# Patient Record
Sex: Male | Born: 1955 | Race: White | Hispanic: No | Marital: Married | Smoking: Current every day smoker
Health system: Southern US, Community
[De-identification: ages and names within clinical notes are randomized; demographics above are authoritative.]

## PROBLEM LIST (undated history)

## (undated) DIAGNOSIS — Z951 Presence of aortocoronary bypass graft: Secondary | ICD-10-CM

## (undated) DIAGNOSIS — Z720 Tobacco use: Secondary | ICD-10-CM

## (undated) DIAGNOSIS — K219 Gastro-esophageal reflux disease without esophagitis: Secondary | ICD-10-CM

## (undated) DIAGNOSIS — R0600 Dyspnea, unspecified: Secondary | ICD-10-CM

## (undated) DIAGNOSIS — E785 Hyperlipidemia, unspecified: Secondary | ICD-10-CM

## (undated) DIAGNOSIS — I2510 Atherosclerotic heart disease of native coronary artery without angina pectoris: Secondary | ICD-10-CM

## (undated) DIAGNOSIS — N183 Chronic kidney disease, stage 3 unspecified: Secondary | ICD-10-CM

## (undated) DIAGNOSIS — I10 Essential (primary) hypertension: Secondary | ICD-10-CM

## (undated) DIAGNOSIS — I509 Heart failure, unspecified: Secondary | ICD-10-CM

## (undated) DIAGNOSIS — I5031 Acute diastolic (congestive) heart failure: Secondary | ICD-10-CM

## (undated) HISTORY — DX: Tobacco use: Z72.0

## (undated) HISTORY — DX: Gastro-esophageal reflux disease without esophagitis: K21.9

## (undated) HISTORY — DX: Hyperlipidemia, unspecified: E78.5

## (undated) HISTORY — DX: Essential (primary) hypertension: I10

## (undated) HISTORY — DX: Acute diastolic (congestive) heart failure: I50.31

## (undated) HISTORY — DX: Presence of aortocoronary bypass graft: Z95.1

---

## 1998-12-08 ENCOUNTER — Encounter: Admission: RE | Admit: 1998-12-08 | Discharge: 1998-12-08 | Payer: Self-pay | Admitting: Family Medicine

## 1999-10-14 ENCOUNTER — Encounter: Admission: RE | Admit: 1999-10-14 | Discharge: 1999-10-14 | Payer: Self-pay | Admitting: Family Medicine

## 1999-11-11 ENCOUNTER — Encounter: Admission: RE | Admit: 1999-11-11 | Discharge: 1999-11-11 | Payer: Self-pay | Admitting: Family Medicine

## 2000-05-25 ENCOUNTER — Encounter: Admission: RE | Admit: 2000-05-25 | Discharge: 2000-05-25 | Payer: Self-pay | Admitting: Family Medicine

## 2000-06-07 ENCOUNTER — Encounter: Payer: Self-pay | Admitting: Emergency Medicine

## 2000-06-07 ENCOUNTER — Emergency Department (HOSPITAL_COMMUNITY): Admission: EM | Admit: 2000-06-07 | Discharge: 2000-06-07 | Payer: Self-pay | Admitting: Emergency Medicine

## 2000-06-12 ENCOUNTER — Emergency Department (HOSPITAL_COMMUNITY): Admission: EM | Admit: 2000-06-12 | Discharge: 2000-06-12 | Payer: Self-pay | Admitting: Emergency Medicine

## 2002-02-15 ENCOUNTER — Emergency Department (HOSPITAL_COMMUNITY): Admission: EM | Admit: 2002-02-15 | Discharge: 2002-02-15 | Payer: Self-pay | Admitting: Emergency Medicine

## 2002-02-17 ENCOUNTER — Encounter: Payer: Self-pay | Admitting: Family Medicine

## 2002-02-17 ENCOUNTER — Inpatient Hospital Stay (HOSPITAL_COMMUNITY): Admission: EM | Admit: 2002-02-17 | Discharge: 2002-02-23 | Payer: Self-pay | Admitting: Emergency Medicine

## 2002-02-20 ENCOUNTER — Encounter: Payer: Self-pay | Admitting: Family Medicine

## 2002-03-15 ENCOUNTER — Encounter: Admission: RE | Admit: 2002-03-15 | Discharge: 2002-03-15 | Payer: Self-pay | Admitting: Family Medicine

## 2002-04-08 ENCOUNTER — Encounter: Admission: RE | Admit: 2002-04-08 | Discharge: 2002-04-08 | Payer: Self-pay | Admitting: Sports Medicine

## 2002-04-25 ENCOUNTER — Encounter: Admission: RE | Admit: 2002-04-25 | Discharge: 2002-04-25 | Payer: Self-pay | Admitting: Family Medicine

## 2002-05-23 ENCOUNTER — Encounter: Admission: RE | Admit: 2002-05-23 | Discharge: 2002-05-23 | Payer: Self-pay | Admitting: Family Medicine

## 2002-05-28 ENCOUNTER — Encounter: Admission: RE | Admit: 2002-05-28 | Discharge: 2002-05-28 | Payer: Self-pay | Admitting: Family Medicine

## 2002-05-30 ENCOUNTER — Encounter: Admission: RE | Admit: 2002-05-30 | Discharge: 2002-05-30 | Payer: Self-pay | Admitting: Family Medicine

## 2002-06-04 ENCOUNTER — Encounter: Admission: RE | Admit: 2002-06-04 | Discharge: 2002-06-04 | Payer: Self-pay | Admitting: Sports Medicine

## 2002-06-05 ENCOUNTER — Encounter: Admission: RE | Admit: 2002-06-05 | Discharge: 2002-06-05 | Payer: Self-pay | Admitting: Sports Medicine

## 2002-06-05 ENCOUNTER — Encounter: Payer: Self-pay | Admitting: Sports Medicine

## 2002-06-11 ENCOUNTER — Encounter: Admission: RE | Admit: 2002-06-11 | Discharge: 2002-06-11 | Payer: Self-pay | Admitting: Family Medicine

## 2002-06-17 ENCOUNTER — Encounter: Admission: RE | Admit: 2002-06-17 | Discharge: 2002-06-17 | Payer: Self-pay | Admitting: Family Medicine

## 2002-06-26 ENCOUNTER — Encounter: Admission: RE | Admit: 2002-06-26 | Discharge: 2002-06-26 | Payer: Self-pay | Admitting: Family Medicine

## 2002-07-11 ENCOUNTER — Encounter: Admission: RE | Admit: 2002-07-11 | Discharge: 2002-07-11 | Payer: Self-pay | Admitting: Family Medicine

## 2002-08-14 ENCOUNTER — Encounter: Admission: RE | Admit: 2002-08-14 | Discharge: 2002-08-14 | Payer: Self-pay | Admitting: Family Medicine

## 2002-09-05 ENCOUNTER — Encounter: Admission: RE | Admit: 2002-09-05 | Discharge: 2002-09-05 | Payer: Self-pay | Admitting: Family Medicine

## 2002-09-20 ENCOUNTER — Encounter: Admission: RE | Admit: 2002-09-20 | Discharge: 2002-09-20 | Payer: Self-pay | Admitting: Family Medicine

## 2002-10-02 ENCOUNTER — Encounter: Admission: RE | Admit: 2002-10-02 | Discharge: 2002-10-02 | Payer: Self-pay | Admitting: Sports Medicine

## 2002-10-08 ENCOUNTER — Encounter: Payer: Self-pay | Admitting: Sports Medicine

## 2002-10-08 ENCOUNTER — Encounter: Admission: RE | Admit: 2002-10-08 | Discharge: 2002-10-08 | Payer: Self-pay | Admitting: Sports Medicine

## 2002-10-10 ENCOUNTER — Encounter: Admission: RE | Admit: 2002-10-10 | Discharge: 2002-10-10 | Payer: Self-pay | Admitting: Family Medicine

## 2002-10-15 ENCOUNTER — Encounter: Admission: RE | Admit: 2002-10-15 | Discharge: 2002-10-15 | Payer: Self-pay | Admitting: Sports Medicine

## 2002-10-15 ENCOUNTER — Encounter: Payer: Self-pay | Admitting: Sports Medicine

## 2002-11-26 ENCOUNTER — Encounter: Admission: RE | Admit: 2002-11-26 | Discharge: 2002-11-26 | Payer: Self-pay | Admitting: Family Medicine

## 2002-12-26 ENCOUNTER — Encounter: Admission: RE | Admit: 2002-12-26 | Discharge: 2002-12-26 | Payer: Self-pay | Admitting: Family Medicine

## 2004-03-03 LAB — LAB REPORT - SCANNED
Creatinine, POC: 50 mg/dL
EGFR: 43

## 2006-01-04 ENCOUNTER — Ambulatory Visit: Payer: Self-pay | Admitting: Family Medicine

## 2007-02-08 DIAGNOSIS — N529 Male erectile dysfunction, unspecified: Secondary | ICD-10-CM | POA: Insufficient documentation

## 2008-09-23 ENCOUNTER — Telehealth (INDEPENDENT_AMBULATORY_CARE_PROVIDER_SITE_OTHER): Payer: Self-pay | Admitting: *Deleted

## 2008-10-16 ENCOUNTER — Encounter: Payer: Self-pay | Admitting: *Deleted

## 2008-10-16 ENCOUNTER — Ambulatory Visit: Payer: Self-pay | Admitting: Family Medicine

## 2008-10-16 DIAGNOSIS — G8929 Other chronic pain: Secondary | ICD-10-CM | POA: Insufficient documentation

## 2008-10-16 DIAGNOSIS — M545 Low back pain: Secondary | ICD-10-CM

## 2008-10-16 HISTORY — DX: Other chronic pain: G89.29

## 2008-10-17 ENCOUNTER — Ambulatory Visit: Payer: Self-pay | Admitting: Family Medicine

## 2008-10-17 ENCOUNTER — Encounter: Payer: Self-pay | Admitting: Family Medicine

## 2008-10-20 LAB — CONVERTED CEMR LAB
Chloride: 100 meq/L (ref 96–112)
Creatinine, Ser: 1.04 mg/dL (ref 0.40–1.50)
Potassium: 5.4 meq/L — ABNORMAL HIGH (ref 3.5–5.3)
Sodium: 135 meq/L (ref 135–145)

## 2008-10-22 ENCOUNTER — Encounter: Payer: Self-pay | Admitting: Family Medicine

## 2008-11-10 ENCOUNTER — Telehealth: Payer: Self-pay | Admitting: *Deleted

## 2008-11-11 ENCOUNTER — Telehealth (INDEPENDENT_AMBULATORY_CARE_PROVIDER_SITE_OTHER): Payer: Self-pay | Admitting: *Deleted

## 2008-12-01 ENCOUNTER — Ambulatory Visit: Payer: Self-pay | Admitting: Family Medicine

## 2008-12-01 ENCOUNTER — Encounter: Payer: Self-pay | Admitting: Family Medicine

## 2008-12-01 LAB — CONVERTED CEMR LAB
AST: 15 units/L (ref 0–37)
Albumin: 4.2 g/dL (ref 3.5–5.2)
Alkaline Phosphatase: 62 units/L (ref 39–117)
Glucose, Bld: 304 mg/dL — ABNORMAL HIGH (ref 70–99)
LDL Cholesterol: 130 mg/dL — ABNORMAL HIGH (ref 0–99)
Potassium: 4.6 meq/L (ref 3.5–5.3)
Sodium: 138 meq/L (ref 135–145)
Total Protein: 6.9 g/dL (ref 6.0–8.3)
Triglycerides: 90 mg/dL (ref <150)

## 2008-12-02 ENCOUNTER — Telehealth: Payer: Self-pay | Admitting: *Deleted

## 2008-12-02 ENCOUNTER — Encounter: Payer: Self-pay | Admitting: Family Medicine

## 2008-12-08 ENCOUNTER — Telehealth: Payer: Self-pay | Admitting: Family Medicine

## 2008-12-17 ENCOUNTER — Telehealth: Payer: Self-pay | Admitting: *Deleted

## 2008-12-21 ENCOUNTER — Encounter: Payer: Self-pay | Admitting: Family Medicine

## 2008-12-21 DIAGNOSIS — E1169 Type 2 diabetes mellitus with other specified complication: Secondary | ICD-10-CM | POA: Insufficient documentation

## 2008-12-21 DIAGNOSIS — E785 Hyperlipidemia, unspecified: Secondary | ICD-10-CM | POA: Insufficient documentation

## 2008-12-22 ENCOUNTER — Ambulatory Visit: Payer: Self-pay | Admitting: Family Medicine

## 2008-12-22 DIAGNOSIS — E1159 Type 2 diabetes mellitus with other circulatory complications: Secondary | ICD-10-CM | POA: Insufficient documentation

## 2008-12-22 DIAGNOSIS — I10 Essential (primary) hypertension: Secondary | ICD-10-CM | POA: Insufficient documentation

## 2008-12-22 HISTORY — DX: Type 2 diabetes mellitus with other circulatory complications: E11.59

## 2008-12-30 ENCOUNTER — Telehealth: Payer: Self-pay | Admitting: Family Medicine

## 2009-01-27 ENCOUNTER — Telehealth: Payer: Self-pay | Admitting: Family Medicine

## 2009-02-23 ENCOUNTER — Telehealth: Payer: Self-pay | Admitting: Family Medicine

## 2009-03-25 ENCOUNTER — Telehealth: Payer: Self-pay | Admitting: Family Medicine

## 2009-04-17 ENCOUNTER — Telehealth: Payer: Self-pay | Admitting: Family Medicine

## 2009-05-18 ENCOUNTER — Telehealth: Payer: Self-pay | Admitting: Family Medicine

## 2009-06-15 ENCOUNTER — Encounter: Payer: Self-pay | Admitting: Family Medicine

## 2009-06-17 ENCOUNTER — Ambulatory Visit: Payer: Self-pay | Admitting: Family Medicine

## 2009-06-17 LAB — CONVERTED CEMR LAB: Hgb A1c MFr Bld: 9.2 %

## 2009-06-22 ENCOUNTER — Telehealth: Payer: Self-pay | Admitting: Family Medicine

## 2009-07-21 ENCOUNTER — Telehealth: Payer: Self-pay | Admitting: Family Medicine

## 2009-07-29 ENCOUNTER — Ambulatory Visit: Payer: Self-pay | Admitting: Family Medicine

## 2009-07-29 ENCOUNTER — Encounter: Payer: Self-pay | Admitting: Family Medicine

## 2009-07-29 DIAGNOSIS — Z720 Tobacco use: Secondary | ICD-10-CM

## 2009-07-30 ENCOUNTER — Telehealth (INDEPENDENT_AMBULATORY_CARE_PROVIDER_SITE_OTHER): Payer: Self-pay | Admitting: *Deleted

## 2009-08-24 ENCOUNTER — Telehealth: Payer: Self-pay | Admitting: Family Medicine

## 2009-09-22 ENCOUNTER — Telehealth: Payer: Self-pay | Admitting: *Deleted

## 2009-10-21 ENCOUNTER — Telehealth: Payer: Self-pay | Admitting: Family Medicine

## 2009-11-16 ENCOUNTER — Telehealth: Payer: Self-pay | Admitting: Family Medicine

## 2009-12-14 ENCOUNTER — Telehealth: Payer: Self-pay | Admitting: Family Medicine

## 2010-01-13 ENCOUNTER — Telehealth: Payer: Self-pay | Admitting: Family Medicine

## 2010-02-15 ENCOUNTER — Telehealth: Payer: Self-pay | Admitting: Family Medicine

## 2010-02-24 ENCOUNTER — Encounter: Payer: Self-pay | Admitting: Family Medicine

## 2010-03-08 ENCOUNTER — Telehealth: Payer: Self-pay | Admitting: *Deleted

## 2010-03-18 ENCOUNTER — Ambulatory Visit: Payer: Self-pay | Admitting: Family Medicine

## 2010-03-18 LAB — CONVERTED CEMR LAB: Hgb A1c MFr Bld: 10 %

## 2010-04-13 ENCOUNTER — Telehealth: Payer: Self-pay | Admitting: Family Medicine

## 2010-05-17 ENCOUNTER — Telehealth: Payer: Self-pay | Admitting: *Deleted

## 2010-06-03 ENCOUNTER — Encounter: Payer: Self-pay | Admitting: Family Medicine

## 2010-06-04 ENCOUNTER — Telehealth: Payer: Self-pay | Admitting: Family Medicine

## 2010-07-02 ENCOUNTER — Encounter: Payer: Self-pay | Admitting: Family Medicine

## 2010-11-29 ENCOUNTER — Encounter: Payer: Self-pay | Admitting: Family Medicine

## 2010-11-29 ENCOUNTER — Ambulatory Visit: Payer: Self-pay | Admitting: Family Medicine

## 2010-11-29 LAB — CONVERTED CEMR LAB
ALT: 14 units/L (ref 0–53)
AST: 14 units/L (ref 0–37)
Albumin: 4.2 g/dL (ref 3.5–5.2)
Alkaline Phosphatase: 48 units/L (ref 39–117)
Hgb A1c MFr Bld: 8.9 %
LDL Cholesterol: 106 mg/dL — ABNORMAL HIGH (ref 0–99)
Potassium: 5.1 meq/L (ref 3.5–5.3)
Sodium: 138 meq/L (ref 135–145)
Total Bilirubin: 0.4 mg/dL (ref 0.3–1.2)
Total Protein: 6.5 g/dL (ref 6.0–8.3)
VLDL: 14 mg/dL (ref 0–40)

## 2010-12-28 ENCOUNTER — Ambulatory Visit: Admission: RE | Admit: 2010-12-28 | Discharge: 2010-12-28 | Payer: Self-pay | Source: Home / Self Care

## 2011-01-05 ENCOUNTER — Telehealth: Payer: Self-pay | Admitting: Family Medicine

## 2011-01-13 ENCOUNTER — Encounter: Payer: Self-pay | Admitting: Family Medicine

## 2011-01-13 ENCOUNTER — Ambulatory Visit: Admit: 2011-01-13 | Payer: Self-pay

## 2011-01-13 ENCOUNTER — Ambulatory Visit: Payer: Self-pay | Admitting: Family Medicine

## 2011-01-13 NOTE — Progress Notes (Signed)
Summary: Rx Req   Phone Note Refill Request Call back at Home Phone [phone] Message from:  Patient  Refills Requested: Medication #1:  OXYCODONE HCL 5 MG CAPS take 1 tablet by mouth q 6 hours as needed for severe pain PLEASE CALL WHEN READY TO PICK UP.  Initial call taken by: Clydell Hakim,  January 13, 2010 11:45 AM Caller: Spouse  Follow-up for Phone Call        will forward to MD. Follow-up by: Theresia Lo RN,  January 13, 2010 3:10 PM  Additional Follow-up for Phone Call Additional follow up Details #1::        script filled and placed in to be called box Additional Follow-up by: Asher Muir MD,  January 14, 2010 1:45 PM    Prescriptions: OXYCODONE HCL 5 MG CAPS (OXYCODONE HCL) take 1 tablet by mouth q 6 hours as needed for severe pain  #120 x 0   Entered and Authorized by:   Asher Muir MD   Signed by:   Asher Muir MD on 01/14/2010   Method used:   Print then Give to Patient   RxID:   [id number]

## 2011-01-13 NOTE — Miscellaneous (Signed)
Summary: patient summary   Clinical Lists Changes  Very nice patient.  Unfortunately, has not managed his diabetes or hypertension well.  often does not follow up as instructed.  also, complicated by fact that he is uninsured because he is self -employed, and apparently, he makes too much money to qualify for [employer] through the hospital.  Problems: Assessed NICOTINE ADDICTION as comment only -    Assessed DIABETES MELLITUS II, UNCOMPLICATED as comment only - poorly controlled.  comes in infrequently.   His updated medication list for this problem includes:    Novolin 70/30 Innolet 70-30 % Susp (Insulin isophane & regular) .Marland KitchenMarland KitchenMarland KitchenMarland Kitchen 30 in the morning and 20 units at night    Lisinopril 20 Mg Tabs (Lisinopril) .Marland Kitchen... 2 tablets by mouth daily for blood pressure    Metformin Hcl 1000 Mg Tabs (Metformin hcl) .Marland Kitchen... 1 tab by mouth two times a day for diabetes  Labs Reviewed: Creat: 0.93 (12/01/2008)    Reviewed HgBA1c results: 10.0 (03/18/2010)  9.2 (06/17/2009)  Assessed HYPERTENSION, BENIGN as comment only - also poorly controlled. His updated medication list for this problem includes:    Lisinopril 20 Mg Tabs (Lisinopril) .Marland Kitchen... 2 tablets by mouth daily for blood pressure  Prior BP: 160/74 (03/18/2010)  Labs Reviewed: K+: 4.6 (12/01/2008) Creat: : 0.93 (12/01/2008)   Chol: 205 (12/01/2008)   HDL: 57 (12/01/2008)   LDL: 130 (12/01/2008)   TG: 90 (12/01/2008)  Assessed HYPERLIPIDEMIA as comment only -  His updated medication list for this problem includes:    Simvastatin 40 Mg Tabs (Simvastatin) .Marland Kitchen... Take 1 tab by mouth daily  Assessed BACK PAIN, LUMBAR as comment only -  His updated medication list for this problem includes:    Flexeril 10 Mg Tabs (Cyclobenzaprine hcl) .Marland Kitchen... Take 1 tab three times a day; do not climb ladders or operate heavy equipment while using this medicine    Oxycodone Hcl 5 Mg Caps (Oxycodone hcl) .Marland Kitchen... Take 1 tablet by mouth q 6 hours as needed for  severe pain; do not fill until Apr 16, 2010    Meloxicam 15 Mg Tabs (Meloxicam) .Marland Kitchen... 1/2 to 1 tab by mouth daily as needed back pain  Observations: Added new observation of PMH REVIEWED: reviewed - no changes required (06/03/2010 13:20)      Impression & Recommendations:  Problem # 1:  DIABETES MELLITUS II, UNCOMPLICATED (ICD-250.00) poorly controlled.  comes in infrequently.   His updated medication list for this problem includes:    Novolin 70/30 Innolet 70-30 % Susp (Insulin isophane & regular) .Marland KitchenMarland KitchenMarland KitchenMarland Kitchen 30 in the morning and 20 units at night    Lisinopril 20 Mg Tabs (Lisinopril) .Marland Kitchen... 2 tablets by mouth daily for blood pressure    Metformin Hcl 1000 Mg Tabs (Metformin hcl) .Marland Kitchen... 1 tab by mouth two times a day for diabetes  Labs Reviewed: Creat: 0.93 (12/01/2008)    Reviewed HgBA1c results: 10.0 (03/18/2010)  9.2 (06/17/2009)  Problem # 2:  NICOTINE ADDICTION (ICD-305.1) Assessment: Comment Only  Problem # 3:  HYPERTENSION, BENIGN (ICD-401.1) Assessment: Comment Only also poorly controlled. His updated medication list for this problem includes:    Lisinopril 20 Mg Tabs (Lisinopril) .Marland Kitchen... 2 tablets by mouth daily for blood pressure  Prior BP: 160/74 (03/18/2010)  Labs Reviewed: K+: 4.6 (12/01/2008) Creat: : 0.93 (12/01/2008)   Chol: 205 (12/01/2008)   HDL: 57 (12/01/2008)   LDL: 130 (12/01/2008)   TG: 90 (12/01/2008)  Problem # 4:  HYPERLIPIDEMIA (ICD-272.4) Assessment: Comment Only past due  for flp and cmet His updated medication list for this problem includes:    Simvastatin 40 Mg Tabs (Simvastatin) .Marland Kitchen... Take 1 tab by mouth daily  Labs Reviewed: SGOT: 15 (12/01/2008)   SGPT: 18 (12/01/2008)   HDL:57 (12/01/2008)  LDL:130 (12/01/2008)  Chol:205 (12/01/2008)  Trig:90 (12/01/2008)  Problem # 5:  BACK PAIN, LUMBAR (ICD-724.2) Assessment: Comment Only on chronic oxycodone, which seems to control pain.  other modalities, such as pt, are challenging because of the lack  of insurance His updated medication list for this problem includes:    Flexeril 10 Mg Tabs (Cyclobenzaprine hcl) .Marland Kitchen... Take 1 tab three times a day; do not climb ladders or operate heavy equipment while using this medicine    Oxycodone Hcl 5 Mg Caps (Oxycodone hcl) .Marland Kitchen... Take 1 tablet by mouth q 6 hours as needed for severe pain; do not fill until Apr 16, 2010    Meloxicam 15 Mg Tabs (Meloxicam) .Marland Kitchen... 1/2 to 1 tab by mouth daily as needed back pain  Complete Medication List: 1)  Flexeril 10 Mg Tabs (Cyclobenzaprine hcl) .... Take 1 tab three times a day; do not climb ladders or operate heavy equipment while using this medicine 2)  Oxycodone Hcl 5 Mg Caps (Oxycodone hcl) .... Take 1 tablet by mouth q 6 hours as needed for severe pain; do not fill until Apr 16, 2010 3)  Novolin 70/30 Innolet 70-30 % Susp (Insulin isophane & regular) .... 30 in the morning and 20 units at night 4)  Lisinopril 20 Mg Tabs (Lisinopril) .... 2 tablets by mouth daily for blood pressure 5)  Simvastatin 40 Mg Tabs (Simvastatin) .... Take 1 tab by mouth daily 6)  Metformin Hcl 1000 Mg Tabs (Metformin hcl) .Marland Kitchen.. 1 tab by mouth two times a day for diabetes 7)  Meloxicam 15 Mg Tabs (Meloxicam) .... 1/2 to 1 tab by mouth daily as needed back pain   Past History:  Past Medical History: Reviewed history from 12/22/2008 and no changes required. 1.5 ppd smoker (>30 pack year)  2.  DM 3.  HLD 4.  back pain

## 2011-01-13 NOTE — Assessment & Plan Note (Signed)
Summary: bp f/u eo    Complete Medication List: 1)  Flexeril 10 Mg Tabs (Cyclobenzaprine hcl) .... Take 1 tab three times a day; do not climb ladders or operate heavy equipment while using this medicine 2)  Novolin 70/30 Innolet 70-30 % Susp (Insulin isophane & regular) .... 30 in the morning and 20 units at night 3)  Lisinopril 20 Mg Tabs (Lisinopril) .... 2 tablets by mouth daily for blood pressure 4)  Simvastatin 40 Mg Tabs (Simvastatin) .... Take 1 tab by mouth daily 5)  Metformin Hcl 1000 Mg Tabs (Metformin hcl) .Marland Kitchen.. 1 tab by mouth two times a day for diabetes 6)  Meloxicam 15 Mg Tabs (Meloxicam) .... 1/2 to 1 tab by mouth daily as needed back pain 7)  Norco 10-325 Mg Tabs (Hydrocodone-acetaminophen) .Marland Kitchen.. 1 tab by mouth three times a day as needed severe pain  Other Orders: No Charge Patient Arrived (NCPA0) (NCPA0)   Orders Added: 1)  No Charge Patient Arrived (NCPA0) [NCPA0]  Appended Document: bp f/u eo Recieved call form Erin up front around 4:30, pt wanting to know why he hasn't been seen yet. Thekla called pt 3 times, checked bathroom and parking lot pt was not here. appt was at 3:00.

## 2011-01-13 NOTE — Letter (Signed)
Summary: Generic Letter  [facility]  [address]   [place], [place] [address]   Phone: [phone]  Fax: [phone]    02/24/2010  [name] [address] [place], [place]  [address]  Dear Colin Keller,    You have not been seen in our office in some time.  Please call [phone] and schedule an office visit.  I look forward to seeing you.        Sincerely,   Asher Muir MD  Appended Document: Generic Letter mailed.  Appended Document: Generic Letter Letter returned unable to forward.

## 2011-01-13 NOTE — Progress Notes (Signed)
Summary: refill   Phone Note Refill Request Call back at Home Phone [phone] Call back at Work Phone [phone] Message from:  Patient  Refills Requested: Medication #1:  OXYCODONE HCL 5 MG CAPS take 1 tablet by mouth q 6 hours as needed for severe pain Please call when ready  Initial call taken by: De Nurse,  February 15, 2010 2:13 PM  Follow-up for Phone Call        will leave up front;   when you call him, would you ask him to schedule an appointment for his diabetes?  thanks Follow-up by: Asher Muir MD,  February 16, 2010 12:42 PM  Additional Follow-up for Phone Call Additional follow up Details #1::        pt's wife will pick up    Prescriptions: OXYCODONE HCL 5 MG CAPS (OXYCODONE HCL) take 1 tablet by mouth q 6 hours as needed for severe pain  #120 x 0   Entered and Authorized by:   Asher Muir MD   Signed by:   Asher Muir MD on 02/16/2010   Method used:   Print then Give to Patient   RxID:   [id number]

## 2011-01-13 NOTE — Progress Notes (Signed)
Summary: refill   Phone Note Refill Request Call back at Home Phone [phone] Call back at Work Phone [phone] Message from:  Patient  Refills Requested: Medication #1:  OXYCODONE HCL 5 MG CAPS take 1 tablet by mouth q 6 hours as needed for severe pain Please call when ready  Initial call taken by: De Nurse,  December 14, 2009 3:11 PM  Follow-up for Phone Call        to pcp Follow-up by: Golden Circle RN,  December 14, 2009 3:21 PM  Additional Follow-up for Phone Call Additional follow up Details #1::        will leave script up front.  can fill on 1/7.   Additional Follow-up by: Asher Muir MD,  December 15, 2009 8:58 AM    Prescriptions: OXYCODONE HCL 5 MG CAPS (OXYCODONE HCL) take 1 tablet by mouth q 6 hours as needed for severe pain  #120 x 0   Entered and Authorized by:   Asher Muir MD   Signed by:   Asher Muir MD on 12/15/2009   Method used:   Print then Give to Patient   RxID:   [id number]   Appended Document: refill lvmom for pt to pick up

## 2011-01-13 NOTE — Progress Notes (Signed)
Summary: Rx Req  Medications Added OXYCODONE HCL 5 MG CAPS (OXYCODONE HCL) take 1 tablet by mouth q 6 hours as needed for severe pain; do not fill until Apr 16, 2010       Phone Note Refill Request Call back at [facility] Phone [phone] Message from:  Patient  Refills Requested: Medication #1:  OXYCODONE HCL 5 MG CAPS take 1 tablet by mouth q 6 hours as needed for severe pain Initial call taken by: Clydell Hakim,  Apr 13, 2010 11:48 AM    New/Updated Medications: OXYCODONE HCL 5 MG CAPS (OXYCODONE HCL) take 1 tablet by mouth q 6 hours as needed for severe pain; do not fill until Apr 16, 2010 Prescriptions: OXYCODONE HCL 5 MG CAPS (OXYCODONE HCL) take 1 tablet by mouth q 6 hours as needed for severe pain; do not fill until Apr 16, 2010  #120 x 0   Entered and Authorized by:   Asher Muir MD   Signed by:   Asher Muir MD on 04/13/2010   Method used:   Print then Give to Patient   RxID:   [id number]   Appended Document: Rx Req informed pt

## 2011-01-13 NOTE — Progress Notes (Signed)
Summary: refill   Phone Note Refill Request Call back at Home Phone [phone] Message from:  Patient  Refills Requested: Medication #1:  NORCO 10-325 MG TABS 1 tab by mouth three times a day as needed severe pain. Next Appointment Scheduled: 01/13/11 Initial call taken by: De Nurse,  January 05, 2011 1:43 PM  Follow-up for Phone Call        will forward to preceptor . Follow-up by: Theresia Lo RN,  January 05, 2011 2:42 PM  Additional Follow-up for Phone Call Additional follow up Details #1::        I will refill prescription for 2 weeks until patient can make his appointment. Will print for patient to pick up.  Additional Follow-up by: Lloyd Huger MD,  January 06, 2011 3:45 PM    Prescriptions: NORCO 10-325 MG TABS (HYDROCODONE-ACETAMINOPHEN) 1 tab by mouth three times a day as needed severe pain  #30 x 0    Entered and Authorized by:   Lloyd Huger MD   Signed by:   Theresia Lo RN on 01/06/2011   Method used:   Print then Give to Patient   RxID:   [id number]  Will not refill at this time.  Needs appt to discuss. Paula Compton MD  January 05, 2011 3:24 PM    called and spoke with wife, she states she called about refilling his RX. explained that patient will need appointment before refill. wife states he had appointment on 12/28/2010 and sat in waiting room for 2 hours and no one ever called him back , finally he left.  it is noted in IDX that he no showed but wife states he was here. has an appointment on 01/13/2011. will send message to Dr. Cristal Ford to ask if she will refill [Dictation 78-1 goes here.] before he comes on 01/13/2011. Theresia Lo RN  January 05, 2011 3:44 PM   RX called to pharmacy. Theresia Lo RN  January 06, 2011 5:36 PM

## 2011-01-13 NOTE — Assessment & Plan Note (Signed)
Summary: f/up,tcb   Vital Signs:  Patient profile:   55 year old male Weight:      174.4 pounds Temp:     98.3 degrees F oral Pulse rate:   84 / minute Pulse rhythm:   regular BP sitting:   160 / 74  (left arm) Cuff size:   large  Vitals Entered By: Loralee Pacas CMA (March 18, 2010 1:44 PM)  Primary Care Provider:  Asher Muir MD  CC:  dm, htn, hld, and back pain.  History of Present Illness: 1.  diabetes--A1C 10.  on 70/30.  taking 25 in am and 15 in pm.  also on metformin.  lowest cbg 65 late afternoon.  if he has lows, they are almost always in the late afternoon.  tried to increase am dose, but felt lightheaded/nauseated.  was up to 30 units in the morning for about a month.  took himself back down to 25.  may feel a little better with this.  not checking sugars every day.  2.  hypertension--still high on lisinopril 20.  takes every day, not always at same time.  makes him a little lightheaded. high today at  160/74.  no chest pain, sob.    3.  hyperlipidemia--at goal on simva  4.  back pain--lower back with pain radiating down left leg.  no weaknes, no b/b incontinence.  no groin anesthesia   Current Medications (verified): 1)  Flexeril 10 Mg Tabs (Cyclobenzaprine Hcl) .... Take 1 Tab Three Times A Day; Do Not Climb Ladders or Operate Heavy Equipment While Using This Medicine 2)  Oxycodone Hcl 5 Mg Caps (Oxycodone Hcl) .... Take 1 Tablet By Mouth Q 6 Hours As Needed For Severe Pain 3)  Novolin 70/30 Innolet 70-30 % Susp (Insulin Isophane & Regular) .... 30 in The Morning and 15 Units At Night 4)  Lisinopril 20 Mg Tabs (Lisinopril) .Marland Kitchen.. 1 Tab By Mouth Daily For Blood Pressure; Pt Needs To Schedule Follow Up Appointment 5)  Simvastatin 40 Mg Tabs (Simvastatin) .... Take 1 Tab By Mouth Daily 6)  Metformin Hcl 1000 Mg Tabs (Metformin Hcl) .... 1/2 Tab By Mouth Daily For 1 Week; Then 1/2 Tab in Am and 1/2 Tab in Pm 1 Week ; Then 1 Tab in Am and 1/2 Tab in Pm 1 Wk. Then 1 Tab  Two Times A Day 7)  Meloxicam 15 Mg Tabs (Meloxicam) .... 1/2 To 1 Tab By Mouth Daily As Needed Back Pain  Physical Exam  General:  Well-developed,well-nourished,in no acute distress; alert,appropriate and cooperative throughout examination Lungs:  normal respiratory effort and normal breath sounds.  prolonged expiration   Heart:  Normal rate and regular rhythm. S1 and S2 normal without gallop, murmur, click, rub or other extra sounds. Additional Exam:  vital signs reviewed   Diabetes Management Exam:    Foot Exam (with socks and/or shoes not present):       Sensory-Pinprick/Light touch:          Left medial foot (L-4): normal          Left dorsal foot (L-5): normal          Left lateral foot (S-1): normal          Right medial foot (L-4): normal          Right dorsal foot (L-5): normal          Right lateral foot (S-1): normal       Sensory-Monofilament:  Left foot: normal          Right foot: normal       Inspection:          Left foot: normal          Right foot: normal       Nails:          Left foot: normal          Right foot: normal   Impression & Recommendations:  Problem # 1:  DIABETES MELLITUS II, UNCOMPLICATED (ICD-250.00) A1C is worse at 10.  discussed the potential consequences of uncontrolled diabetes.  he is worried about it.  feeling poorly on higher dose of insulin is part of the problem.  not being able to afford test strips is also a problem.  he agrees he needs to get control of his diabetes.  Plan:  increase 70/30 to 30 in the morning and and 20 units at night.  he will call me if still high or if he goes low.  eat an afternoon snack to try to avoid lows.    His updated medication list for this problem includes:    Novolin 70/30 Innolet 70-30 % Susp (Insulin isophane & regular) .Marland KitchenMarland KitchenMarland KitchenMarland Kitchen 30 in the morning and 20 units at night    Lisinopril 20 Mg Tabs (Lisinopril) .Marland Kitchen... 2 tablets by mouth daily for blood pressure    Metformin Hcl 1000 Mg Tabs (Metformin  hcl) .Marland Kitchen... 1 tab by mouth two times a day for diabetes  Orders: A1C-[facility] (81191) [facility]- Est  Level 4 (47829)  Problem # 2:  HYPERTENSION, BENIGN (ICD-401.1) Assessment: Unchanged  still not at goal.  increase lisinopril.  due for bmet, but pt has to pay out of pocket for all labs; so wil check bmet next visit after he has gone up on his lisinopril His updated medication list for this problem includes:    Lisinopril 20 Mg Tabs (Lisinopril) .Marland Kitchen... 2 tablets by mouth daily for blood pressure  Orders: [facility]- Est  Level 4 (99214)  Problem # 3:  HYPERLIPIDEMIA (ICD-272.4) Assessment: Unchanged  at goal His updated medication list for this problem includes:    Simvastatin 40 Mg Tabs (Simvastatin) .Marland Kitchen... Take 1 tab by mouth daily  Orders: [facility]- Est  Level 4 (56213)  Problem # 4:  BACK PAIN, LUMBAR (ICD-724.2) Assessment: Unchanged  symptoms consistent with radiculopathy.  would like to try physical therapy, but cannot afford.  no red flags for immediate imaging. for now continue oxycodone.  need controlled substances contract at next visit.  His updated medication list for this problem includes:    Flexeril 10 Mg Tabs (Cyclobenzaprine hcl) .Marland Kitchen... Take 1 tab three times a day; do not climb ladders or operate heavy equipment while using this medicine    Oxycodone Hcl 5 Mg Caps (Oxycodone hcl) .Marland Kitchen... Take 1 tablet by mouth q 6 hours as needed for severe pain    Meloxicam 15 Mg Tabs (Meloxicam) .Marland Kitchen... 1/2 to 1 tab by mouth daily as needed back pain  Orders: [facility]- Est  Level 4 (99214)  Complete Medication List: 1)  Flexeril 10 Mg Tabs (Cyclobenzaprine hcl) .... Take 1 tab three times a day; do not climb ladders or operate heavy equipment while using this medicine 2)  Oxycodone Hcl 5 Mg Caps (Oxycodone hcl) .... Take 1 tablet by mouth q 6 hours as needed for severe pain 3)  Novolin 70/30 Innolet 70-30 % Susp (Insulin isophane & regular) .... 30 in the morning and  20 units at night 4)  Lisinopril 20  Mg Tabs (Lisinopril) .... 2 tablets by mouth daily for blood pressure 5)  Simvastatin 40 Mg Tabs (Simvastatin) .... Take 1 tab by mouth daily 6)  Metformin Hcl 1000 Mg Tabs (Metformin hcl) .Marland Kitchen.. 1 tab by mouth two times a day for diabetes 7)  Meloxicam 15 Mg Tabs (Meloxicam) .... 1/2 to 1 tab by mouth daily as needed back pain  Patient Instructions: 1)  It was nice to see you today. 2)  Gradually increase your insulin to 30 units in the morning and 20 at night.   3)  Call me if you go low (<60) 4)  If you get to the 30 and 20 and are still high (>140) 5)  Check your sugars two times a day 6)  Try to eat a snack  in the afternoon. 7)  For your blood pressure, start taking 2 tablets of lisinopril.  It is OK to take it at night. 8)  Please schedule a follow-up appointment in 2-4 weeks.  We need to check labs at your next visit.  Prescriptions: LISINOPRIL 20 MG TABS (LISINOPRIL) 2 tablets by mouth daily for blood pressure  #60 x 0   Entered and Authorized by:   Asher Muir MD   Signed by:   Asher Muir MD on 03/18/2010   Method used:   Print then Give to Patient   RxID:   [id number]   Laboratory Results   Blood Tests   Date/Time Received: March 18, 2010 2:02 PM  Date/Time Reported: March 18, 2010 2:02 PM   HGBA1C: 10.0%   (Normal Range: Non-Diabetic - 3-6%   Control Diabetic - 6-8%)  Comments: ...............test performed by......Marland KitchenBonnie A. [place], MLS (ASCP)cm     Prevention & Chronic Care Immunizations   Influenza vaccine: given  (10/16/2008)   Influenza vaccine due: 10/16/2009    Tetanus booster: Not documented    Pneumococcal vaccine: Not documented  Colorectal Screening   Hemoccult: Not documented    Colonoscopy: Not documented  Other Screening   PSA: Not documented   Smoking status: current  (07/29/2009)   Smoking cessation counseling: yes  (12/22/2008)  Diabetes Mellitus   HgbA1C: 10.0  (03/18/2010)   Hemoglobin A1C due: 03/01/2009     Eye exam: Not documented    Foot exam: yes  (03/18/2010)   High risk foot: Not documented   Foot care education: Not documented   Foot exam due: 12/22/2009    Urine microalbumin/creatinine ratio: Not documented    Diabetes flowsheet reviewed?: Yes   Progress toward A1C goal: Deteriorated    Stage of readiness to change (diabetes management): Action  Lipids   Total Cholesterol: 205  (12/01/2008)   LDL: 130  (12/01/2008)   LDL Direct: 98  (07/29/2009)   HDL: 57  (12/01/2008)   Triglycerides: 90  (12/01/2008)    SGOT (AST): 15  (12/01/2008)   SGPT (ALT): 18  (12/01/2008)   Alkaline phosphatase: 62  (12/01/2008)   Total bilirubin: 0.4  (12/01/2008)    Lipid flowsheet reviewed?: Yes   Progress toward LDL goal: At goal  Hypertension   Last Blood Pressure: 160 / 74  (03/18/2010)   Serum creatinine: 0.93  (12/01/2008)   Serum potassium 4.6  (12/01/2008)    Hypertension flowsheet reviewed?: Yes   Progress toward BP goal: Unchanged    Stage of readiness to change (hypertension management): Action  Self-Management Support :   Personal Goals (by the next clinic visit) :  Personal A1C goal: 8  (07/29/2009)     Personal blood pressure goal: 130/80  (07/29/2009)     Personal LDL goal: 100  (07/29/2009)    Diabetes self-management support: Copy of home glucose meter record, CBG self-monitoring log, Written self-care plan  (03/18/2010)   Diabetes care plan printed    Hypertension self-management support: BP self-monitoring log, Written self-care plan, Education handout  (03/18/2010)   Hypertension self-care plan printed.   Hypertension education handout printed    Lipid self-management support: Written self-care plan  (03/18/2010)   Lipid self-care plan printed.

## 2011-01-13 NOTE — Miscellaneous (Signed)
   Clinical Lists Changes  Medications: Rx of METFORMIN HCL 1000 MG TABS (METFORMIN HCL) 1 tab by mouth two times a day for diabetes;  #60 x 12;  Signed;  Entered by: Denny Levy MD;  Authorized by: Denny Levy MD;  Method used: Electronically to [facility]*, [address] [place], [place]  [address], Ph: [phone] or [phone], Fax: [phone]    Prescriptions: METFORMIN HCL 1000 MG TABS (METFORMIN HCL) 1 tab by mouth two times a day for diabetes  #60 x 12   Entered and Authorized by:   Denny Levy MD   Signed by:   Denny Levy MD on 07/02/2010   Method used:   Electronically to        [facility]* (retail)       [address] [place], [place]  [address]       Ph: [phone] or [phone]       Fax: [phone]   RxID:   [id number]

## 2011-01-13 NOTE — Progress Notes (Signed)
Summary: Rx Req   Phone Note Refill Request Call back at Home Phone [phone] Call back at [phone] Message from:  Patient  Refills Requested: Medication #1:  OXYCODONE HCL 5 MG CAPS take 1 tablet by mouth q 6 hours as needed for severe pain; do not fill until May 6 Initial call taken by: Clydell Hakim,  May 17, 2010 11:41 AM  Follow-up for Phone Call        script left up front Follow-up by: Asher Muir MD,  May 17, 2010 5:11 PM  Additional Follow-up for Phone Call Additional follow up Details #1::        lvm for pt to inform him that his rx is up front to be picked up Additional Follow-up by: Loralee Pacas CMA,  May 18, 2010 8:56 AM    Prescriptions: OXYCODONE HCL 5 MG CAPS (OXYCODONE HCL) take 1 tablet by mouth q 6 hours as needed for severe pain; do not fill until Apr 16, 2010  #120 x 0   Entered and Authorized by:   Asher Muir MD   Signed by:   Asher Muir MD on 05/17/2010   Method used:   Print then Give to Patient   RxID:   [id number]

## 2011-01-13 NOTE — Progress Notes (Signed)
Summary: refill   Phone Note Refill Request Call back at Home Phone [phone] Message from:  Patient  Refills Requested: Medication #1:  OXYCODONE HCL 5 MG CAPS take 1 tablet by mouth q 6 hours as needed for severe pain Please call when ready  Initial call taken by: De Nurse,  March 08, 2010 4:12 PM Caller: Patient  Follow-up for Phone Call        I refilled these on 3/8; so I cannot give a refill until 4/7.  Also, he needs to come in for an appointment.  I haven't seen him since August.   Follow-up by: Asher Muir MD,  March 09, 2010 12:45 PM    called and left vm for pt.Loralee Pacas CMA  March 09, 2010 2:21 PM

## 2011-01-13 NOTE — Progress Notes (Signed)
Summary: phn msg  Medications Added NORCO 10-325 MG TABS (HYDROCODONE-ACETAMINOPHEN) 1 tab by mouth three times a day as needed severe pain       Phone Note Call from Patient Call back at Home Phone [phone]   Caller: Spouse-Laurie Summary of Call: Would like to talk to Dr. Lafonda Mosses concerning her husband. Initial call taken by: Clydell Hakim,  June 04, 2010 12:17 PM  Follow-up for Phone Call        He would like to switch pain meds to something that does not have to be picked up every month.  only taking about 2 oxycodone per day.  will switch to norco and give 5 refills.   Follow-up by: Asher Muir MD,  June 07, 2010 11:43 AM    New/Updated Medications: NORCO 10-325 MG TABS (HYDROCODONE-ACETAMINOPHEN) 1 tab by mouth three times a day as needed severe pain Prescriptions: NORCO 10-325 MG TABS (HYDROCODONE-ACETAMINOPHEN) 1 tab by mouth three times a day as needed severe pain  #90 x 5   Entered and Authorized by:   Asher Muir MD   Signed by:   Asher Muir MD on 06/07/2010   Method used:   Printed then faxed to ...       [facility]* (retail)       [address] [place], [place]  [address]       Ph: [phone] or [phone]       Fax: [phone]   RxID:   [id number]    Impression & Recommendations:  Problem # 1:  BACK PAIN, LUMBAR (ICD-724.2) Assessment Comment Only currently on oxycodone 5mg .  He would like to switch pain meds to something that does not have to be picked up every month.  only taking about 2 oxycodone per day.  will switch to norco and give 5 refills.    The following medications were removed from the medication list:    Oxycodone Hcl 5 Mg Caps (Oxycodone hcl) .Marland Kitchen... Take 1 tablet by mouth q 6 hours as needed for severe pain; do not fill until Apr 16, 2010 His updated medication list for this problem includes:    Flexeril 10 Mg Tabs (Cyclobenzaprine hcl) .Marland Kitchen... Take 1  tab three times a day; do not climb ladders or operate heavy equipment while using this medicine    Meloxicam 15 Mg Tabs (Meloxicam) .Marland Kitchen... 1/2 to 1 tab by mouth daily as needed back pain    Norco 10-325 Mg Tabs (Hydrocodone-acetaminophen) .Marland Kitchen... 1 tab by mouth three times a day as needed severe pain  Complete Medication List: 1)  Flexeril 10 Mg Tabs (Cyclobenzaprine hcl) .... Take 1 tab three times a day; do not climb ladders or operate heavy equipment while using this medicine 2)  Novolin 70/30 Innolet 70-30 % Susp (Insulin isophane & regular) .... 30 in the morning and 20 units at night 3)  Lisinopril 20 Mg Tabs (Lisinopril) .... 2 tablets by mouth daily for blood pressure 4)  Simvastatin 40 Mg Tabs (Simvastatin) .... Take 1 tab by mouth daily 5)  Metformin Hcl 1000 Mg Tabs (Metformin hcl) .Marland Kitchen.. 1 tab by mouth two times a day for diabetes 6)  Meloxicam 15 Mg Tabs (Meloxicam) .... 1/2 to 1 tab by mouth daily as needed back pain 7)  Norco 10-325 Mg Tabs (Hydrocodone-acetaminophen) .Marland Kitchen.. 1 tab by mouth three times a day as needed severe pain

## 2011-01-19 NOTE — Assessment & Plan Note (Signed)
Summary: meet new md/eo   Vital Signs:  Patient profile:   55 year old male Weight:      173.2 pounds Temp:     98.4 degrees F oral Pulse rate:   89 / minute BP sitting:   160 / 69  (left arm) Cuff size:   regular  Vitals Entered By: Jimmy Footman, CMA (January 13, 2011 3:33 PM) CC: meet new doctor   Primary Provider:  Lloyd Huger MD  CC:  meet new doctor.  History of Present Illness: 1. HTN. Takes lisinopril inconsistently. PRescribed 40mg  daily, sometimes only takes one pill. Thinks it causes him to feel lightheaded on some days. Does not take BP at home for comparison. Value today is similar to that of previous visits over past 2 years (160s/70s). Patient was unsure of what normal value is.  2. Back pain. 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 Mainly lower back. Relies on pain medications most days, taking norco three times a day. Weaned off of oxycodone this year. On occasion  takes an advil. When he knows he doesn't have to work, tries to go without pain meds until the end of the day.   3. DM. Recent A1c was 8.9, this is improved from 10. Has been taking insulin 25 units in am and 15 at bedtime, gets this OTC at [facility]. Admits to noncompliance. Never checks CBGs due to having no money for testing strips. Doses his insulin on basis of the size of his meals and the way he feels each day. No ophthalmologist f/u in years. No foot ulcers.   4. HLD. Not taking statin due to cost currently. Last LDL was 106, he is convinced he doesn't really need this medicine. Minimizes his red meat intake.  5. Tobacco abuse. Still smoking 1ppd. Has tried to quit several times. Made it several months once, before family stress with his son triggered relapse. THinks cold-turkey would be the best method.   Past History:  Risk Factors: Smoking Status: current (07/29/2009) Packs/Day: 1.0 (07/29/2009)  Past Medical History: 1 ppd smoker (>30 pack year)  DM HLD  back  pain  Family History: Reviewed history and no changes required. DM, CAD in parents.   Social History: Reviewed history from 02/08/2007 and no changes required. 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 He and his wife were both laid off during recession. Wife Lawson Fiscal is also [facility] patient. Grown children. 1ppd smoker. Previously used marijuana -quit 10/03  Review of Systems  The patient denies anorexia, fever, weight loss, syncope, dyspnea on exertion, peripheral edema, prolonged cough, and abdominal pain.    Physical Exam  General:  Well-developed,well-nourished,in no acute distress; alert,appropriate and cooperative throughout examination Head:  normocephalic and atraumatic.   Eyes:  PERRLA, EOMI Mouth:  Oral mucosa and oropharynx without lesions or exudates.  Teeth in good repair. Neck:  R carotid bruit and L carotid bruit.   Lungs:  Normal respiratory effort, chest expands symmetrically. Lungs are clear to auscultation, no crackles or wheezes. Heart:  Normal rate and regular rhythm. S1 and S2 normal. II/VI systolic murmur heard at LLSB.  Extremities:  No clubbing, cyanosis, edema, or deformity noted with normal full range of motion of all joints.   Neurologic:  No cranial nerve deficits noted. Station and gait are normal.Sensory, motor and coordinative functions appear intact.  Diabetes Management Exam:    Foot Exam (with socks and/or shoes not present):       Sensory-Pinprick/Light touch:  Left medial foot (L-4): normal          Left dorsal foot (L-5): normal          Left lateral foot (S-1): normal          Right medial foot (L-4): normal          Right dorsal foot (L-5): normal          Right lateral foot (S-1): normal       Sensory-Monofilament:          Left foot: normal          Right foot: normal       Inspection:          Left foot: normal          Right foot: normal       Nails:          Left foot: normal          Right foot: normal   Impression &  Recommendations:  Problem # 1:  HYPERTENSION, BENIGN (ICD-401.1)  Uncontrolled. Compliance seems to be an issue. Will switch lisinopril 40 to combination with HCTZ since patient feels this higher dose of ACEi causes dizzyness. Encouraged to take BP at home when he is able (friend has BP cuff). Likely will need to titrate this medication.   The following medications were removed from the medication list:    Lisinopril 20 Mg Tabs (Lisinopril) .Marland Kitchen... 2 tablets by mouth daily for blood pressure His updated medication list for this problem includes:    Lisinopril-hydrochlorothiazide 20-12.5 Mg Tabs (Lisinopril-hydrochlorothiazide) .Marland Kitchen... Take one tab by mouth daily.  Orders: [facility]- Est  Level 4 (04540)  Problem # 2:  BACK PAIN, LUMBAR (ICD-724.2)  Refilled Norco for chronic back pain. Attempts at PT have been unsuccessful due to lack of insurance. Would like to address reducing dependence on narcotics at next visit; however, this regimen may be an improvement from previous. EIther decrease strength of tablets or change to tramadol gradually.  The following medications were removed from the medication list:    Flexeril 10 Mg Tabs (Cyclobenzaprine hcl) .Marland Kitchen... Take 1 tab three times a day; do not climb ladders or operate heavy equipment while using this medicine    Meloxicam 15 Mg Tabs (Meloxicam) .Marland Kitchen... 1/2 to 1 tab by mouth daily as needed back pain His updated medication list for this problem includes:    Norco 10-325 Mg Tabs (Hydrocodone-acetaminophen) .Marland Kitchen... 1 tab by mouth three times a day as needed severe pain    Aspir-low 81 Mg Tbec (Aspirin)  Orders: [facility]- Est  Level 4 (98119)  Problem # 3:  DIABETES MELLITUS II, UNCOMPLICATED (ICD-250.00)  Improved control. Compliance is again an issue. Doses his insulin based on meal size and how he feels on a daily basis. Encouraged patient to find a cheap OTC meter/strips at a [employer] ([facility]). Refilled metformin and insulin is obtained OTC.  Will f/u A1c at next visit The following medications were removed from the medication list:    Lisinopril 20 Mg Tabs (Lisinopril) .Marland Kitchen... 2 tablets by mouth daily for blood pressure His updated medication list for this problem includes:    Novolin 70/30 Innolet 70-30 % Susp (Insulin isophane & regular) .Marland KitchenMarland KitchenMarland KitchenMarland Kitchen 30 in the morning and 20 units at night    Metformin Hcl 1000 Mg Tabs (Metformin hcl) .Marland Kitchen... 1 tab by mouth two times a day for diabetes    Aspir-low 81 Mg Tbec (Aspirin)    Lisinopril-hydrochlorothiazide 20-12.5  Mg Tabs (Lisinopril-hydrochlorothiazide) .Marland Kitchen... Take one tab by mouth daily.  Orders: [facility]- Est  Level 4 (44010)  Problem # 4:  NICOTINE ADDICTION (ICD-305.1) Encouraged him to consider cessation. He is in the contemplation stage. We discussed implications for health and the methods he felt would work best (wants to try cold [place]). Reviewed support line (1800quitnow).   Problem # 5:  HYPERLIPIDEMIA (ICD-272.4)  LDL controlled at last check off of statin. Patient not currently taking medication and he feels is unnecessary. Bruits auscultated on exam, asymptomatic. Will recheck fasting lipid panel or d-LDL at next visit. Again encouraged smoking cessation. This patient will likely benefit from statin therapy when he is willing.  I would like to obtain carotid dopplers, however he is unable to get this currently due to insurance. He and his wife are getting paperwork for possible orange care or medicare coverage. Will obtain when patient is agreeable.  The following medications were removed from the medication list:    Simvastatin 40 Mg Tabs (Simvastatin) .Marland Kitchen... Take 1 tab by mouth daily  Future Orders: Carotid Doppler (Carotid doppler) ... 02/09/2011  Complete Medication List: 1)  Novolin 70/30 Innolet 70-30 % Susp (Insulin isophane & regular) .... 30 in the morning and 20 units at night 2)  Metformin Hcl 1000 Mg Tabs (Metformin hcl) .Marland Kitchen.. 1 tab by mouth two times a day for  diabetes 3)  Norco 10-325 Mg Tabs (Hydrocodone-acetaminophen) .Marland Kitchen.. 1 tab by mouth three times a day as needed severe pain 4)  Aspir-low 81 Mg Tbec (Aspirin) 5)  Lisinopril-hydrochlorothiazide 20-12.5 Mg Tabs (Lisinopril-hydrochlorothiazide) .... Take one tab by mouth daily.  Patient Instructions: 1)  Nice to meet you. 2)  Your prescriptions will be at [facility]. 3)  You can get a cheaper glucometer and strips at [facility] or [facility]. 4)  Please think about quitting smoking. You can call 1-800-quitnow for help, or come in for an appointment to discuss options. 5)  Please get your neck ultrasound test to evaluate your neck arteries. 6)  Call me if you have any questions! Prescriptions: NORCO 10-325 MG TABS (HYDROCODONE-ACETAMINOPHEN) 1 tab by mouth three times a day as needed severe pain  #90 x 0    Entered and Authorized by:   Lloyd Huger MD   Signed by:   Lloyd Huger MD on 01/13/2011   Method used:   Print then Give to Patient   RxID:   [id number] METFORMIN HCL 1000 MG TABS (METFORMIN HCL) 1 tab by mouth two times a day for diabetes  #60 x 12   Entered and Authorized by:   Lloyd Huger MD   Signed by:   Lloyd Huger MD on 01/13/2011   Method used:   Electronically to        [facility]* (retail)       [address] [place], [place]  [address]       Ph: [phone] or [phone]       Fax: [phone]   RxID:   [id number] LISINOPRIL-HYDROCHLOROTHIAZIDE 20-12.5 MG TABS (LISINOPRIL-HYDROCHLOROTHIAZIDE) Take one tab by mouth daily.  #90 x 2   Entered and Authorized by:   Lloyd Huger MD   Signed by:   Lloyd Huger MD on 01/13/2011   Method used:   Electronically to        [facility]* (retail)  [address] [place], [place]  [address]       Ph: [phone] or [phone]       Fax: [phone]   RxID:   [id number]    Orders  Added: 1)  Carotid Doppler [Carotid doppler] 2)  [facility]- Est  Level 4 [[address]]

## 2011-02-07 ENCOUNTER — Telehealth: Payer: Self-pay | Admitting: Family Medicine

## 2011-02-07 DIAGNOSIS — M545 Low back pain: Secondary | ICD-10-CM

## 2011-02-07 DIAGNOSIS — E785 Hyperlipidemia, unspecified: Secondary | ICD-10-CM

## 2011-02-07 MED ORDER — SIMVASTATIN 80 MG PO TABS: 40.0000 mg | ORAL_TABLET | Freq: Every evening | ORAL | Status: DC

## 2011-02-07 MED ORDER — HYDROCODONE-ACETAMINOPHEN 10-325 MG PO TABS: 1.0000 | ORAL_TABLET | Freq: Three times a day (TID) | ORAL | Status: DC | PRN

## 2011-02-07 NOTE — Telephone Encounter (Signed)
Mrs. Flegel want to speak with you regarding refills to patients meds.  Only received rx for one.  Use to have multiple refills for meds.

## 2011-03-19 LAB — GLUCOSE, CAPILLARY: Glucose-Capillary: 143 mg/dL — ABNORMAL HIGH (ref 70–99)

## 2011-04-29 NOTE — H&P (Signed)
[place]  Patient:    Colin Keller, Colin Keller Visit Number: [id number] MRN: [id number]          Service Type: MED Location: [facility] 2901 [id number] Attending Physician:  McDiarmid, Leighton Roach. Dictated by:   Mont Dutton, M.D. Admit Date:  02/17/2002                           History and Physical  SERVICE:  Conservation officer, historic buildings.  CHIEF COMPLAINT:  Vomiting.  HISTORY OF PRESENT ILLNESS:  This is a 55 year old white male with a history of diabetes mellitus, type 2, two-day history of prostatitis versus UTI and was diagnosed with this on February 15, 2002 in [facility] Room.  He was noted to have a large prostate on the exam at that time; also with a rotator cuff injury diagnosed at that time, was given a referral to local orthopedist and a referral to a urologist and started on tetracycline on February 15, 2002, took one dose of this, started with emesis.  The following morning at 2 oclock, which is February 16, 2002, he has had continued emesis since that time.  Last insulin treatment was on February 15, 2002 in the morning, none since. Patient complains of feeling terrible since that time, continued with emesis. His last primary M.D. visit was approximately two years ago.  REVIEW OF SYSTEMS:  No headache.  Does admit to fever and chills for a couple of weeks.  CARDIOVASCULAR:  Positive palpitations.  Admits to some vague chest pain anteriorly.  No radiation.  RESPIRATORY:  Slight splinted breathing secondary to his shoulder and muscle pain but denies shortness of breath.  GI: Positive indigestion.  Positive nausea and vomiting.  Denies any hematemesis. No bowel movement in the past two days.  SKIN:  Patient admits to having one tattoo but no rashes.  NEUROLOGIC:  Occasional paresthesias, none currently. MUSCULOSKELETAL:  Admits to myalgias in back.  EYES:  Positive blurry vision today.  States his eyesight has generally been getting weak over the  years. ENT:  Denies any hearing difficulty.  Does admit to having a dry mouth. GENITOURINARY:  Positive dysuria.  Patient states he feels his flow is constricted and he starts and stops his flow.  Denies frequency.  Denies urgency.  PROBLEM LIST: 1. Diabetes mellitus, type 2. 2. Tobacco abuse. 3. Impotence.  MEDICATIONS: 1. Aspirin 81 mg p.o. q.d. when he remembers. 2. Insulin 70/30, 13 to 15 units q.a.m. based on "how he feels." 3. Viagra 50 mg with sexual activity. 4. Advil two to three tabs p.o. q.d.  ALLERGIES:  No known allergies.  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  He has been married for 22 years and has two children, 90 years of age and 47 years of age.  Denies any alcohol use.  He states he has maybe three beers in the summertime, otherwise, no intake of alcohol.  Does admit to 1-1/2-pack-per-day smoking history for greater than 30 years but wants to stop smoking currently.  Does admit to occasional marijuana use at bedtime, otherwise, denies illicit drugs.  FAMILY HISTORY:  Aunt with diabetes mellitus, type 2.  Father passed away at age 76 secondary to heart disease; also had an MI at age 89.  PHYSICAL EXAMINATION:  VITAL SIGNS:  Temperature 97.0, blood pressure 140/104, pulse 135, respiratory rate 22, weight  77 kg.  GENERAL:  Alert, in no apparent distress.  A/O x3.  Insight and judgment intact.  HEENT:  Conjunctivae and nose within normal limits.  No drainage.  PERRL. EOMI.  Dry oral mucosa.  No lesions.  Occasional caries.  No erythema or drainage in posterior oropharynx.  NECK:  Bilateral submandibular nodes palpable.  Thin neck.  Nontender.  No thyromegaly.  RESPIRATORY:  Clear to auscultation bilaterally.  No wheezes, rhonchi or rhonchi.  No respiratory distress or use of accessory musculature.  CARDIOVASCULAR:  Regular rate and rhythm.  Prominent S1 and S2.  No  murmur. No edema on extremities.  MUSCULOSKELETAL:  Strength 5/5 in right upper and right lower extremities, 5/5 strength in left lower extremity, 4/5 strength in left upper extremity.  GI:  No masses.  Soft, nontender, nondistended.  Normoactive bowel sounds.  No hepatosplenomegaly.  SKIN:  Tattoo on the right forearm.  No other rashes noted.  No petechiae.  No purpura.  NEUROLOGIC:  Cranial nerves II-XII intact.  General sensation intact. Reflexes 2+ in bilateral upper and lower extremities.  RECTAL:  Heme-negative stool.  Enlarged prostate was firm and minimally tender to palpation.  LABORATORY AND ACCESSORY DATA:  On ABG, pH 7.28, PCO2 22, PO2 118, bicarb 11, 98% saturation on room air, blood acetone moderate.  BMP:  Sodium 134, potassium 4.5, chloride 92, bicarb 12, BUN 27, creatinine 1.6, glucose of 516, calcium of 10.5, anion gap equals 20, corrected sodium equal 138.  CBC:  White blood cells 32, hemoglobin 16.2, hematocrit 47, platelets 389,000; 92% neutrophils, 6% lymphs 2% monos, ANC was 29.8 with greater than 20% bands. Urinalysis from February 15, 2002 was greater than 1000 glucose, large hemoglobin, greater than 80 ketones, 100 protein, moderate leukocyte esterase, too numerous to count white blood cells, 11-20 red blood cells and a few bacteria. Urinalysis from today pending.  Chest x-ray pending.  EKG pending.  Cardiac enzymes pending.  Hemoglobin A1c pending.  ASSESSMENT AND PLAN:  Patient is a 55 year old male with history of diabetes mellitus, type 2, possible prostatitis versus urinary tract infection diagnosed February 15, 2002, and in diabetic ketoacidosis.  1. Diabetic ketoacidosis.  Patient has an anion gap acidosis with glucose of    516 on basic metabolic panel, moderate blood acetones, possible inciting    event equals infection (urinary tract infection versus prostatitis).    Bicarbonate is currently 11 on arterial blood gas.  Will place patient    n.p.o.  until capillary blood glucose is less than 200, rehydrate with     normal saline, status post bolus in emergency room, until capillary blood    glucose is less than 200, then change to D-5 half normal saline.  Continue    insulin drip at 8 units of insulin per hour until capillary blood glucose    is less than 200; at that point, we will change to home regimen with    70/30 insulin and follow up with sliding scale.  Continue two-hour basic    metabolic panel, q.1h. capillary blood glucoses until capillary blood    glucose less than 200, then q.4h. basic metabolic panels.  Will monitor    throughout. 2. Prostatitis, diagnosed in emergency room, February 15, 2002, and started on    tetracycline for this.  Will obtain urine culture and blood culture    secondary to increased white blood cell count, although currently afebrile.    Will start on ciprofloxacin.  Patient has a referral to a urologist.  Prostate is enlarged but not appreciably tender.  Will follow culture. 3. History of rotator cuff injury diagnosed here on February 15, 2002.  Will treat    pain with Tylenol p.r.n., increase medications as needed.  Patient has    referral to orthopedist, once discharged. 4. Chest wall discomfort probably secondary to muscle use and splinting of his    shoulder injury.  Will check electrocardiogram, chest x-ray and cardiac    enzymes as possible inciting event for diabetic ketoacidosis and with    history of diabetes mellitus, type 2, possible atypical presentation of    chest pain with myocardial infarction. 5. Diabetes mellitus, type 2.  Once diabetic ketoacidosis resolves, will    determine appropriate regimen based on insulin need, as patient has not    followed up with primary medical doctor in some time.  Will check    hemoglobin A1c to evaluate past control and consult diabetic coordinator    once diabetic ketoacidosis resolves. Dictated by:   Mont Dutton, M.D. Attending Physician:   McDiarmid, Tawanna Cooler D. DD:  02/17/02 TD:  02/18/02 Job: [id number]

## 2011-04-29 NOTE — Discharge Summary (Signed)
[place]. [facility]  Patient:    Colin Keller, Colin Keller Visit Number: [id number] MRN: [id number]          Service Type: MED Location: [phone] Attending Physician:  McDiarmid, Leighton Roach. Dictated by:   Mont Dutton, M.D. Admit Date:  02/17/2002 Discharge Date: 02/23/2002                             Discharge Summary  ADMISSION DIAGNOSES: 1. Diabetic ketoacidosis. 2. Prostatitis. 3. History of rotator cuff injury. 4. Musculoskeletal chest wall discomfort. 5. Diabetes mellitus type 2.  DISCHARGE DIAGNOSES: 1. Diabetes mellitus type 2 versus type 1 with a diabetic ketoacidosis    resolved. 2. Prostatic abscess. 3. Rotator cuff injury.  CONSULTATIONS: 1. Infectious disease. 2. Diabetes coordinator. 3. Cardiology for transesophageal echocardiogram. 4. Urology for prostatic abscess.  HISTORY OF PRESENT ILLNESS:  The patient is a 55 year old male with history of diabetes mellitus type 2 followed by Dr. Lenard Galloway at [facility].  However, the patient has not seen Dr. Lenard Galloway in many years.  He was diagnosed with a UTI versus prostatitis on February 15, 2002, and presented with two-day history of enlarged prostate on exam.  He also had rotator cuff injury during that time and was given referral to orthopedist and referral to urologist when seen in ER.  He was started on tetracycline and took one. However, he started having emesis on the day prior to admission.  He has had emesis since that time.  He proceeded to have more weakness and overall feeling "terrible."  He had some palpitations and indigestion, nausea, vomiting and myalgias, blurry vision and dysuria.  The patient had presented on insulin 70/30 13-15 units in the morning based on "how he feels." Otherwise, he had no other diabetic treatment.  He does not check his home CBGs.  Admission blood sugar was 516 on BMP.  He did have a white count elevated as well at 32, hemoglobin  16.2, hematocrit 47.2, platelets 389, 92% neutrophils, 6% lymphs, 2% monos.  ANC was 29.8 with greater than 20% bands. The patients ABG showed pH 7.28, pCO2 22, pO2 118, bicarb 11 and 98% on room air.  Blood acetone was moderate.  Anion gap was 20.  Urinalysis on February 15, 2002, showed greater than 1000 glucose, large hemoglobin, greater than 80 ketones, 100 protein, moderate leukocyte esterase, too numerous to count white blood cells, 11-20 red blood cells and few bacteria.  For remaining details of the admission History and Physical, please consult the dictated History and Physical.  HOSPITAL COURSE:  #1 - DIABETES MELLITUS WITH DIABETIC KETOACIDOSIS:  The patients original bicarb was 11 on ABG.  The patient was placed NPO until CBGs less than 200 and was rehydrated with normal saline, status post bolus until CBGs less than 200, then change to D-5-1/2 normal saline.  He was started on an insulin drip at eight units per hour until his CBGs were less than 200, and then changed to his home regimen.  He was continued on q.2h. BMPs and q.1h. CBGs until CBG was less than 200 and then advanced to q.4h. BMP.  The patients acidosis corrected quickly with hydration.  On BMP, since first day of hospitalization, his bicarb had elevated at 23, CBG 158.  At that time, he was switched over to 70/30 insulin with stopping the insulin drip 30 minutes afterward.  Sliding scale was changed to D-5-1/2  normal saline and was started on an ADA 2000 calorie diet as tolerated.  At that point in time, the patient denied any nausea or abdominal pain and was very hungry and desired to eat.  An EKG was done due to concern of his palpitations and showed sinus tachycardia with possible left ventricular hypertrophy, however, no apparent ST changes.  The patient was placed on home regimen and required increase in his insulin regimen as his hemoglobin A1C was 14.8.  Diabetic coordinator was consulted and assisted in  recommendations for his regimen.  They started off increasing the insulin regimen to 30 units of 70/30 q.a.m. and 15 units q.p.m. with addition of sliding scale insulin.  C-peptide was obtained which was less than 0.5 indicating minimal production of insulin by patient.  The patients NPH was increased at night and otherwise insulins were titrated to obtain good control of his hyperglycemia.  On February 21, 2002, the patient had improved glycemic control, but no ideal; however, the patient was not very mobile and had been a very high functioning individual working throughout the day and had been very mobile prior to this.  We suspect that the current regimen would be close to ideal as an outpatient.  He will continue on sliding scale insulin prior to discharge.  He is to continue on his current regimen of 35 units of 70/30 units in the morning, 20 units of 70/30 insulin at night and was discharged home and continued on this same regimen.  #2 - PROSTATIC ABSCESS:  The patient had a methicillin-sensitive Staphylococcus aureus UTI.  The patient was noted to have greater than 100,000 colonies of Staphylococcus aureus noted in his urine culture.  At this point, infectious disease was consulted and started on Ancef as Staphylococcus aureus was pansensitive except for penicillin.  He did have gram-positive bacteremia which was suspected to be Staphylococcus aureus.  As recommended, by infectious disease, the patient obtained a transthoracic echocardiogram.  This was negative for any valve vegetations.  At that point, a transesophageal echocardiogram was obtained.  The patient remained afebrile during hospitalization and white blood cells were down trending.  He was continued on Ancef.  Transesophageal echocardiogram performed through cardiology consult was normal with no vegetation seen.  As TTE and TEE were negative, antibiotics were changed to Tequin p.o. x4 weeks as recommended by urology.   Urology had consulted on this patient for a prostatic abscess that was noted on CT.  They recommended to keep him on oral antibiotics x30 days.  At that point, follow  up for reevaluation and possible repeat CT.  They also recommended to continue Flomax at 0.4 mg on discharge as the patient had some urinary difficulty.  The patient was noted to be afebrile with white blood cells down trending throughout hospitalization.  He was started on Tequin prior to discharge to recheck a CT in four weeks.  He would follow up with Dr. Vernie Ammons with urology. He would continue Flomax 0.4 mg q.d. for the prostatic enlargement symptoms.  #3 - ROTATOR CUFF INJURY:  The patient was noted on admission to have been diagnosed with a rotator cuff injury on prior visit to the ER.  However, no films were available on palpation of joint tenderness and was more musculoskeletal.  The patient was treated symptomatically and pain improved during hospitalization.  He did obtain some Percocet for pain control.  He is to follow up with primary care Gerrick Ray.  The patient was discharged to home in stable condition  on February 23, 2002.  DISCHARGE MEDICATIONS: 1. Insulin 70/30 35 units subcu each morning, 20 units subcu q.h.s. 2. Tequin 400 mg tablets one tablet p.o. q.d. x28 days. 3. Flomax 0.4 mg capsules one tablet p.o. q.h.s.  ACTIVITY:  No restrictions.  DIET:  Adhere to a diabetic diet.  FOLLOWUP:  Follow up was scheduled for Dr. Billy Coast office.  The patient is to call [phone], to make an appointment to be seen in four weeks.  He is to call [phone], at [facility], to be seen for hospital followup with Dr. Chilton Si or Dr. Lenard Galloway within two weeks.  CONDITION ON DISCHARGE:  Stable.Dictated by:   Mont Dutton, M.D.  Attending Physician:  McDiarmid, Tawanna Cooler D. DD:  04/01/02 TD:  04/02/02 Job: [id number]

## 2011-04-29 NOTE — Consult Note (Signed)
[place]  Patient:    Colin Keller, Colin Keller Visit Number: [id number] MRN: [id number]          Service Type: MED Location: [phone] Attending Physician:  McDiarmid, Leighton Roach. Dictated by:   Veverly Fells Vernie Ammons, M.D. Proc. Date: 02/21/02 Admit Date:  02/17/2002   CC:         Mena Pauls, M.D.   Consultation Report  REASON FOR CONSULTATION:  The patient is a 55 year old white male seen in hospital consultation for further evaluation of prostatic abscess noted on CT scan.  HISTORY OF PRESENT ILLNESS:  He is a type 2 diabetic who was admitted on February 17, 2002 with a two-day history of prostatitis versus UTI.  He was seen on February 15, 2002 in the emergency room and was noted to have a large prostate on exam at that time and was referred to a urologist and started on tetracycline. He then developed emesis and was feeling terrible.  He was subsequently found to have an elevated creatinine of 1.6, glucose of 516, and white count of 32,000.  His urine had moderate leukocyte esterase and too numerous to count white blood cells, few bacteria, 10 to 20 red cells.  He was therefore admitted for treatment of his diabetic ketoacidosis and cultures were obtained.  He was placed on antibiotics and is seen at this time and appears to be doing better.  His past urologic history is significant that he had no frequency, he has not had hematuria, and he has never had a prior prostatitis, UTI, or history of kidney stones.  He does have significant obstructive symptoms consistent of intermittency, slow stream, occasional hesitancy, but denies frequency, urgency, flank pain.  His only complaint at this time is pain in his left shoulder.  PAST MEDICAL HISTORY:  Type 2 diabetes.  Tobacco abuse.  Erectile dysfunction.  MEDICATIONS:  Aspirin, insulin, Viagra, and Advil.  ALLERGIES:  NKDA.  SOCIAL HISTORY:  The patient is married and denies alcohol use other  than maybe three beers in the summer time.  He smokes one and a half pack of cigarettes a day and he has done that for 30 years.  He does admit to occasional marijuana use.  FAMILY HISTORY:  Aunt had diabetes.  Father died at 46 of heart disease.  REVIEW OF SYSTEMS:  He has left shoulder pain but no abdominal pain.  No pulmonary, GI, or cardiovascular complaints at this time.  PHYSICAL EXAMINATION:  VITAL SIGNS:  Pulse 85, blood pressure 134/77, respirations 19.  GENERAL:  The patient is a well-developed, well-nourished white male in no apparent distress.  ABDOMEN:  Flat, soft, and nontender.  Without masses, HSN.  No inguinal hernias or adenopathy.  GENITALIA:  He has normal circumcised phallus with normal glands and meatus. His scrotum, testicles, and epididymis are normal.  RECTAL:  He has normal anus and perineum.  He has normal rectal tone.  His prostate is 2+ and enlarged.  It is asymmetric.  Right lobe is much larger than the left, bulbar in shape, smooth, and nonfluctuant and nontender.  No seminal vesicle abnormality was palpable.  LABORATORY DATA:  Urine and blood both grew Staphylococcus aureus that were pansensitivite except to penicillin.  His creatinine is 0.8.  His CT scan shows normal kidneys, ureters, and bladder.  He has no renal calculi.  He has a 2 cm heterogenous area in the right lobe of his prostate with lower signaled and surrounding tissue but  no gas is present within. There appears to be no extension outside of the prostate.  There is also a tiny 1 to 2 mm calcification just behind the symphysis pubis.  IMPRESSION: 1. Prostatic abscess with marked systemic improvement on antibiotics:  I agree    completely with changing the antibiotics to fluoroquinolones and would    recommend he stay on that for one month time.  I would not proceed with    transurethral unroofing of his prostatic urethra at this time since he    has noted significant improvement,  he is voiding well, and his prostate    is not particularly fluctuant at this time. 2. Obstructive voiding symptoms secondary to his prostatic abscess:  These    will likely resolve with resolution of his abscess but I will start him on    alpha blocker for short-term symptomatic relief. 3. Pelvic calcification seen on CT that is mentioned by the radiologist as to    possibly be in the urethra but I do not feel that is the case.  The    calcification is extremely small and the patient has no dysuria.  I think    it is too small to get hung up in the urethra and is likely just a small    vascular calcification.  RECOMMENDATIONS: 1. Switch to oral fluoroquinolones antibiotics such as Tequin or Levaquin.  He    will need to remain on that for a full four-week course. 2. Observe right now rather than transurethral resection of his abscess since    he is clinically improving. 3. We will repeat a CT scan in four weeks after he completes four weeks of    antibiotics. 4. We will start him on some Flomax for symptomatic relief right now. Dictated by:   Veverly Fells Vernie Ammons, M.D. Attending Physician:  McDiarmid, Tawanna Cooler D. DD:  02/21/02 TD:  02/22/02 Job: [id number]

## 2011-10-26 ENCOUNTER — Other Ambulatory Visit: Payer: Self-pay | Admitting: Family Medicine

## 2011-10-26 DIAGNOSIS — M545 Low back pain: Secondary | ICD-10-CM

## 2011-10-26 MED ORDER — HYDROCODONE-ACETAMINOPHEN 10-325 MG PO TABS: 1.0000 | ORAL_TABLET | Freq: Three times a day (TID) | ORAL | Status: DC | PRN

## 2011-10-27 ENCOUNTER — Other Ambulatory Visit: Payer: Self-pay | Admitting: *Deleted

## 2011-11-24 ENCOUNTER — Telehealth: Payer: Self-pay | Admitting: Family Medicine

## 2011-11-24 NOTE — Telephone Encounter (Signed)
Noreene Larsson, Does this patient need to make an appointment before refills can be made? -----[place] Dec

## 2011-11-24 NOTE — Telephone Encounter (Signed)
He should still have a refill at the pharmacy, as I called in 2 months worth on 10/26/11. Pharmacy should contact us if no refills are left. He does need an appointment within the next month.

## 2011-11-24 NOTE — Telephone Encounter (Signed)
Wants to know if MD can have pain med ready for p/u tomorrow, going out of town tomorrow afternoon.

## 2011-11-25 NOTE — Telephone Encounter (Signed)
I called both [facility] and [facility]. Prescription on 11/14 was called in to [facility] (not [facility]-has not been here since last April). Had one refill available still. That refill was picked up today 12/14 by patient. Patient needs appointment in next month prior to additional refills.

## 2011-11-25 NOTE — Telephone Encounter (Signed)
LVM for patient to call back. ?

## 2011-12-23 ENCOUNTER — Telehealth: Payer: Self-pay | Admitting: Family Medicine

## 2011-12-23 ENCOUNTER — Other Ambulatory Visit: Payer: Self-pay | Admitting: Family Medicine

## 2011-12-23 DIAGNOSIS — M545 Low back pain: Secondary | ICD-10-CM

## 2011-12-23 MED ORDER — HYDROCODONE-ACETAMINOPHEN 10-325 MG PO TABS: 1.0000 | ORAL_TABLET | Freq: Three times a day (TID) | ORAL | Status: DC | PRN

## 2011-12-23 NOTE — Telephone Encounter (Signed)
Pt has appt 12/30/2011, wife wants some called in until then,Will forward to Dr Cristal Ford

## 2011-12-23 NOTE — Telephone Encounter (Signed)
Wife is calling about [name] medications.  She has contact the Pharmacy earlier this week, but they put that she was requesting it early, and this is on his Norco.

## 2011-12-23 NOTE — Telephone Encounter (Signed)
Refill is set to expire on 12/26/11. Have called in for 15 tablets until appointment.

## 2011-12-30 ENCOUNTER — Encounter: Payer: Self-pay | Admitting: Family Medicine

## 2011-12-30 ENCOUNTER — Ambulatory Visit (INDEPENDENT_AMBULATORY_CARE_PROVIDER_SITE_OTHER): Payer: Self-pay | Admitting: Family Medicine

## 2011-12-30 VITALS — BP 118/64 | HR 81 | Temp 97.9°F | Ht 72.0 in | Wt 171.4 lb

## 2011-12-30 DIAGNOSIS — M545 Low back pain, unspecified: Secondary | ICD-10-CM

## 2011-12-30 DIAGNOSIS — H53459 Other localized visual field defect, unspecified eye: Secondary | ICD-10-CM

## 2011-12-30 DIAGNOSIS — E119 Type 2 diabetes mellitus without complications: Secondary | ICD-10-CM

## 2011-12-30 DIAGNOSIS — F17200 Nicotine dependence, unspecified, uncomplicated: Secondary | ICD-10-CM

## 2011-12-30 DIAGNOSIS — H53419 Scotoma involving central area, unspecified eye: Secondary | ICD-10-CM

## 2011-12-30 DIAGNOSIS — I10 Essential (primary) hypertension: Secondary | ICD-10-CM

## 2011-12-30 DIAGNOSIS — E785 Hyperlipidemia, unspecified: Secondary | ICD-10-CM

## 2011-12-30 LAB — POCT UA - GLUCOSE/PROTEIN: Glucose, UA: 500

## 2011-12-30 MED ORDER — SIMVASTATIN 80 MG PO TABS: 40.0000 mg | ORAL_TABLET | Freq: Every evening | ORAL | Status: DC

## 2011-12-30 MED ORDER — HYDROCODONE-ACETAMINOPHEN 10-325 MG PO TABS: 1.0000 | ORAL_TABLET | Freq: Three times a day (TID) | ORAL | Status: DC | PRN

## 2011-12-30 MED ORDER — METFORMIN HCL 1000 MG PO TABS: 1000.0000 mg | ORAL_TABLET | Freq: Two times a day (BID) | ORAL | Status: DC

## 2011-12-30 MED ORDER — LISINOPRIL 20 MG PO TABS: 20.0000 mg | ORAL_TABLET | Freq: Every day | ORAL | Status: DC

## 2011-12-30 NOTE — Patient Instructions (Signed)
Will check labs today. Make an appointment in next 2-3 months. Check blood sugar first thing in the morning then when you feel symptoms. Write down your blood sugar or store in meter. Increase your insulin one unit each day for next 3 days. Call in one week with your blood sugar and we will discuss plans. Will make eye doctor referral.   Diabetes, Keeping Your Heart and Blood Vessels Healthy Too much glucose (sugar) in the blood for a long period of time can cause problems for those who have diabetes. This high blood glucose can damage many parts of the body, such as the heart, blood vessels, eyes, and kidneys. Heart and blood vessel disease can lead to heart attacks and strokes, which is the leading cause of death for people with diabetes. There are many things you can to do slow down or prevent the complications from diabetes. WHAT DO MY HEART AND BLOOD VESSELS DO? Your heart and blood vessels make up your circulatory system. Your heart is a big muscle that pumps blood through your body. Your heart pumps blood carrying oxygen to large blood vessels (arteries) and small blood vessels (capillaries). Other blood vessels, called veins, carry blood back to the heart. HOW DO MY BLOOD VESSELS GET CLOGGED? Several things, including having diabetes, can make your blood cholesterol level too high. Cholesterol is a substance that is made by the body and used for many important functions. It is also found in some food that comes from animals. When cholesterol is too high, the insides of large blood vessels become narrowed, even clogged. Narrowed and clogged blood vessels make it harder for enough blood to get to all parts of your body. This problem is called atherosclerosis. WHAT CAN HAPPEN WHEN BLOOD VESSELS ARE CLOGGED? When arteries become narrowed and clogged, you may have heart problems and are at increased risk for heart attack and stroke:  Chest pain (angina) causes pain in your chest, arms, shoulders,  or back. You may feel the pain more when your heart beats faster, such as when you exercise. The pain may go away when you rest. You also may feel very weak and sweaty. If you do not get treatment, chest pain may happen more often. If diabetes has damaged the heart nerves, you may not feel the chest pain.   Heart attack. A heart attack happens when a blood vessel in or near the heart becomes blocked. Not enough blood can get to that part of the heart muscle so the area becomes oxygen deprived and the heart muscle may be permanently damaged.  WHAT ARE THE WARNING SIGNS OF A HEART ATTACK? You may have one or more of the following warning signs:  Chest pain or discomfort.   Pain or discomfort in your arms, back, jaw, or neck.   Indigestion or stomach pain.   Shortness of breath.   Sweating.   Nausea or vomiting.   Light-headedness.   You may have no warning signs at all, or they may come and go.  HOW DOES HEART DISEASE CAUSE HIGH BLOOD PRESSURE? Narrowed blood vessels leave a smaller opening for blood to flow through. It is like turning on a garden hose and holding your thumb over the opening. The smaller opening makes the water shoot out with more pressure. In the same way, narrowed blood vessels lead to high blood pressure. Other factors, such as kidney problems and being overweight, can also lead to high blood pressure. Many people with diabetes also have high blood  pressure. If you have heart, eye, or kidney problems from diabetes, high blood pressure can make them worse. If you have high blood pressure, ask your caregiver how to lower it. Your caregiver may be asked to take blood pressure medicine every day. Some types of blood pressure medicine can also help keep your kidneys healthy. To lower your blood pressure, your may be asked to lose weight; eat more fruits and vegetables; eat less salt and high-sodium foods, such as canned soups, luncheon meats, salty snack foods, and fast foods;  and drink less alcohol. WHAT ARE THE WARNING SIGNS OF A STROKE? A stroke happens when part of your brain is not getting enough blood and stops working. Depending on the part of the brain that is damaged, a stroke can cause:  Sudden weakness or numbness of your face, arm, or leg on one side of your body.   Sudden confusion, trouble talking, or trouble understanding.   Sudden dizziness, loss of balance, or trouble walking.   Sudden trouble seeing in one or both eyes or sudden double vision.   Sudden severe headache.  Sometimes, one or more of these warning signs may happen and then disappear. You might be having a "mini-stroke," also called a TIA (transient ischemic attack). If you have any of these warning signs, tell your caregiver right away. HOW CAN CLOGGED BLOOD VESSELS HURT MY LEGS AND FEET? Peripheral vascular disease can happen when the openings in your blood vessels become narrow and not enough blood gets to your legs and feet. You may feel pain in your buttocks, the back of your legs, or your thighs when you stand, walk, or exercise. Sometimes, surgery is necessary to treat this problem. WHAT CAN I DO TO KEEP MY BLOOD VESSELS HEALTHY AND PREVENT HEART DISEASE AND STROKE?  Keep your blood glucose under control. An A1c blood test will probably be ordered by your caregiver at least twice a year. The A1c test tells you your average blood glucose for the past 2 to 3 months and can give valuable information on the overall control of your diabetes.   Keep your blood pressure under control. Have it checked at every health care visit. If you are on medication to control blood pressure, take it exactly as prescribed. The target for most people is below 130/80.   Keep your cholesterol under control. Have it checked at least once a year. The targets for most people are:   LDL (bad) cholesterol: below 100 mg/dl.   HDL (good) cholesterol: above 40 mg/dl in men and above 50 mg/dl in women.    Triglycerides (another type of fat in the blood): below 150 mg/dl.   Make physical activity a part of your daily routine. Aim for at least 30 minutes of exercise most days of the week. Check with your caregiver to learn what activities are best for you.   Make sure that the foods you eat are "heart-healthy." Include foods high in fiber, such as oat bran, oatmeal, whole-grain breads and cereals, fruits, and vegetables. Cut back on foods high in saturated fat or cholesterol, such as meats, butter, dairy products with fat, eggs, shortening, lard, and foods with palm oil or coconut oil.   Maintain a healthy weight. If you are overweight, try to exercise most days of the week. See a registered dietitian for help in planning meals and lowering the fat and calorie content.   If you smoke, QUIT. Your caregiver can tell you about ways to help you quit  smoking.   Ask your caregiver whether you should take an aspirin every day. Studies have shown that taking a low dose of aspirin every day can help reduce your risk of heart disease and stroke.   Take your medicines as directed.  SEEK MEDICAL CARE IF:   You have any of the warning signs of a heart attack or stroke.   If you have pain in your legs or feet when walking.   If your feet and legs are cool or cold to the touch.  FOR MORE INFORMATION   Diabetes educators (nurses, dietitians, pharmacists, and other health professionals).   To find a diabetes educator near you, call the American Association of Diabetes Educators (AADE) toll-free at 1-800-TEAMUP4 8433623172), or look on the Internet at www.diabeteseducator.org and click on "Find a Diabetes Educator."   Dietitians.   To find a dietitian near you, call the American Dietetic Association toll-free at (562)258-4057, or look on the Internet at www.eatright.org and click on "Find a Nutrition Professional."   Government.   The National Heart, Lung, and Blood Institute (NHLBI) is part of  the [employer]. To learn more about heart and blood vessel problems, write or call NHLBI Information Center, [id number], [place] [address], (602)[phone]; or see PopSteam.is on the Internet.   To get more information about taking care of diabetes, contact:  National Diabetes Information Clearinghouse [address] [place], [place] [address] Phone: 281-827-1932 or [phone] Fax: [phone] Email: ndic@info .StageSync.si Internet: www.diabetes.StageSync.si National Diabetes Education Program [address] [place], [place] [address] Phone: [phone] Fax: [phone] Internet: CommunicationFinder.com.au American Diabetes Association [address] [place], [place] [address] Phone: [phone] Internet: www.diabetes.org [employer] [facility] [address] [place], [place] [address] Phone: ([phone] Internet: www.jdrf.org Document Released: 12/01/2003 Document Revised: 08/10/2011 Document Reviewed: 05/08/2009 [facility] Patient Information 2012 [place], [place].

## 2011-12-31 DIAGNOSIS — H53419 Scotoma involving central area, unspecified eye: Secondary | ICD-10-CM | POA: Insufficient documentation

## 2011-12-31 NOTE — Assessment & Plan Note (Signed)
Deteriorated from 10-->13.4 today. Have advised to increase his current regimen of Novolin 25 u, 15 u. Increase daytime dose by one unit for next 3 days. Patient agrees to start using glucometer at least for fasting CBG and when he feels symptoms of low blood sugar. Have discussed the difference in relative hypoglycemia vs actual hypoglycemia, and with his A1c, I very much doubt he is having actual low blood sugar. He is to call with CBG values next week for further titration. Will check fasting labs. Have agreed to return to clinic in next 2 months given his long transit and difficulty with affording office visits.

## 2011-12-31 NOTE — Assessment & Plan Note (Signed)
Ongoing spiderweb pattern in left eye concerning after traumatic sneeze. Will make ophthalmology referral today for exam and for uncontrolled diabetes.

## 2011-12-31 NOTE — Assessment & Plan Note (Signed)
At goal today. Will continue lisinopril at current dose in setting of proteinuria. Check labs to assess renal function.

## 2011-12-31 NOTE — Assessment & Plan Note (Addendum)
Pain is chronic in similar distribution since onset. MRI from 2003 reviewed with small disc herniation in lumbar area. Have refilled pain medication as this is helpful for patient in his physically demanding job. Have discussed PT but patient cannot afford.

## 2011-12-31 NOTE — Assessment & Plan Note (Signed)
Is satisfied with progress to date. Will address at f/u visit. Referral to pharm clinic if pt is willing.

## 2011-12-31 NOTE — Assessment & Plan Note (Addendum)
Will have patient come back for fasting lipids and continue current dose zocor in the meantime. Strongly advised smoking cessation, risk factors for arterial disease. Continue daily asa with multiple risk factors.

## 2011-12-31 NOTE — Progress Notes (Signed)
  Subjective:    Patient ID: Colin Keller, male    DOB: 1956/03/30, 56 y.o.   MRN: [id number]  HPI  1. DM. Taking novolin from [facility], 25 u qam and 15 q evening. Did not increase insulin as we discussed last year due to the subjective feeling of having low blood sugar, needing a snack. Admits he is noncompliant with checking CBG. Received a new glucometer for christmas but has not used this yet. Has no recent measurements. He feels like a1c is likely elevated as he is stressed from being jobless and eating more sweets. Denies any foot ulcers, numbness/tingling, polyuria.   2. Vision problem. 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 This started after a hard sneeze during a recent viral illness. Symptoms are stable for weeks. Has not seen an ophthalmologist due to finances. Denies eye pain, redness, HA.  3. Tobacco abuse. Has cut down on smoking from last year but still 0.5-1 ppd.   4. Shoulder pain. Having chronic shoulder pain developed over several months. Feels a lump in clavicle area. Pain worse with overhead lifting. Discussed following this up at next visit and maintaining flexibility to prevent adhesions.   5. Back pain. 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 Is taking norco for back pain TID which helps functionality somewhat. No falls, weakness, numbness.   Review of Systems See HPI otherwise negative. Active smoker.    Objective:   Physical Exam  Vitals reviewed. Constitutional: He is oriented to person, place, and time. He appears well-developed and well-nourished. No distress.  HENT:  Head: Normocephalic and atraumatic.  Mouth/Throat: Oropharynx is clear and moist.  Eyes: EOM are normal. Pupils are equal, round, and reactive to light.  Cardiovascular: Normal rate, regular rhythm, normal heart sounds and intact distal pulses.  Exam reveals no gallop.   No murmur heard. Pulmonary/Chest: Effort normal and  breath sounds normal. No respiratory distress. He has no wheezes. He has no rales.  Abdominal: Soft.  Musculoskeletal: He exhibits no edema and no tenderness.       No foot ulcers. Sensation to light touch intact.  Neurological: He is alert and oriented to person, place, and time. No cranial nerve deficit. He exhibits normal muscle tone. Coordination normal.  Psychiatric: He has a normal mood and affect.      Assessment & Plan:

## 2012-01-18 ENCOUNTER — Telehealth: Payer: Self-pay | Admitting: *Deleted

## 2012-01-18 NOTE — Telephone Encounter (Signed)
Spoke with Lawson Fiscal pt's wife and told her that we were unable to schedule an appt for him for opthalmology. Told her that he would have to pay out of pocket for this exam. I will attempt to call around for her to find out if there is anyone locally that he can see that is not so expensive so that he can have this taken care of. She stated that she has been trying to get him in to the [place] I told her that would be great for him! Laureen Ochs, Viann Shove

## 2012-06-11 ENCOUNTER — Telehealth: Payer: Self-pay | Admitting: Family Medicine

## 2012-06-11 NOTE — Telephone Encounter (Signed)
Patient made an appt on 7/8 but needs refills on Norco sent to [facility].  He is completely out and would like enough to last until that appt at least.

## 2012-06-11 NOTE — Telephone Encounter (Signed)
Will forward to MD.  

## 2012-06-12 ENCOUNTER — Other Ambulatory Visit: Payer: Self-pay | Admitting: Family Medicine

## 2012-06-12 DIAGNOSIS — M545 Low back pain: Secondary | ICD-10-CM

## 2012-06-12 MED ORDER — HYDROCODONE-ACETAMINOPHEN 10-325 MG PO TABS: 1.0000 | ORAL_TABLET | Freq: Three times a day (TID) | ORAL | Status: DC | PRN

## 2012-06-12 NOTE — Telephone Encounter (Signed)
I will send #20 tabs via fax until his appointment. Please notify patient.

## 2012-06-13 ENCOUNTER — Encounter: Payer: Self-pay | Admitting: Sports Medicine

## 2012-06-13 ENCOUNTER — Ambulatory Visit (INDEPENDENT_AMBULATORY_CARE_PROVIDER_SITE_OTHER): Payer: Self-pay | Admitting: Sports Medicine

## 2012-06-13 VITALS — BP 155/75 | HR 70 | Temp 97.0°F | Ht 72.0 in | Wt 172.4 lb

## 2012-06-13 DIAGNOSIS — E1165 Type 2 diabetes mellitus with hyperglycemia: Secondary | ICD-10-CM

## 2012-06-13 DIAGNOSIS — M7500 Adhesive capsulitis of unspecified shoulder: Secondary | ICD-10-CM | POA: Insufficient documentation

## 2012-06-13 DIAGNOSIS — M25519 Pain in unspecified shoulder: Secondary | ICD-10-CM | POA: Insufficient documentation

## 2012-06-13 DIAGNOSIS — E119 Type 2 diabetes mellitus without complications: Secondary | ICD-10-CM

## 2012-06-13 DIAGNOSIS — F17200 Nicotine dependence, unspecified, uncomplicated: Secondary | ICD-10-CM

## 2012-06-13 DIAGNOSIS — S43109A Unspecified dislocation of unspecified acromioclavicular joint, initial encounter: Secondary | ICD-10-CM

## 2012-06-13 MED ORDER — KETOROLAC TROMETHAMINE 60 MG/2ML IM SOLN
60.0000 mg | Freq: Once | INTRAMUSCULAR | Status: AC
  Administered 2012-06-13: 60 mg via INTRAMUSCULAR

## 2012-06-13 NOTE — Progress Notes (Signed)
  [facility]  Patient name: Colin Keller MRN [id number]  Date of birth: November 28, 1956  CC & HPI  LEIBISH MCGREGOR is a 56 y.o. male presenting today for evaluation of R shoulder pain.  He was seen at his Chiropractor's Office ([facility]) on 06/12/2012 and received a 3 view x-ray of his shoulder.    Location  R shoulder  Onset  2-3 months; worsened acutely over the past 2-3 weeks  Character  constant Dull, sharp with movement Pain is worse with movement;   Severity  10/10 especially at night   Temporal  worse at night  Alleviating  pain meds  Aggrivating  any movement, reaching across body, reaching behind, limited by pain  Georganna Skeans, R handed, still working, but affecting greatly, no overhead work able,  No falls or known trauma Pain meds helping but needing more, Worse at night.   Bump on r shoulder present for >1 year ROS  Decreased sleep, 2-3 hrs/night  Pertinent History Reviewed  Medical & Surgical Hx:  Reviewed: Significant for diabetes, hyperlipidemia Medications: Reviewed & Updated - see associated section Social History: Reviewed - Significant for 1 ppd smoker, denies EtOH  Objective Findings  Vitals:  Filed Vitals:   06/13/12 0836  BP: 155/75  Pulse: 70  Temp: 97 F (36.1 C)   PE: GENERAL:  Adult  male.  Examined in [facility].  In moderate discomfort; norespiratory distress.   PSYCH: Alert and appropriately interactive; Insight:Absent and Fair  Alert, oriented, thought content appropriate H&N: AT/[place], MMM, no scleral icterus, EOMi EXTREMITIES: Moves all 4 extremities spontaneously, warm well perfused, no edema, bilateral DP and PT pulses 2/4.   Shoulder: Right shoulder negative.  Apprehension, jerk.  Pain with cross arm test and he came however, strength intact.  Severe limitation in range of motion's in all 3 planes.  Active ROA M. Limited to 60 and abduction and reached.  Right hip of only with internal rotation, external rotation, limited to 20  beyond neutral.  Passive range of motion with only additional 10-15 in each plane

## 2012-06-13 NOTE — Assessment & Plan Note (Addendum)
Injection of subacromial bursa and Glenohumeral joint referal to [facility] for serial injections Given home exercises.  Patient unwilling to physical therapy at this time due to insurance status.  Considering future  INJECTION: Patient was given informed consent, signed copy in the chart. Appropriate time out was taken. Area prepped and draped in usual sterile fashion. 1 cc of methylprednisolone 40 mg/ml plus  9 cc of 1% lidocaine without epinephrine was injected into the subacromal space (1.5cc) and glenohumeral joint space (8.5cc) using a(n) posterior approach. The patient tolerated the procedure well. There were no complications. Post procedure instructions were given.

## 2012-06-14 LAB — LDL CHOLESTEROL, DIRECT: Direct LDL: 123 mg/dL — ABNORMAL HIGH

## 2012-06-16 NOTE — Assessment & Plan Note (Addendum)
History of dislocation.  ? AC Joint separation.  The films from chiropractor reviewed.  Shoulder exercises provided as above.

## 2012-06-16 NOTE — Assessment & Plan Note (Signed)
Will have pt followup with Dr. Cristal Ford.  Will collect labs she requested for next visit.  Diabetes risk factor for frozen shoulder.

## 2012-06-16 NOTE — Assessment & Plan Note (Signed)
Limiting healing ability at this time.

## 2012-06-18 ENCOUNTER — Ambulatory Visit (INDEPENDENT_AMBULATORY_CARE_PROVIDER_SITE_OTHER): Payer: Self-pay | Admitting: Family Medicine

## 2012-06-18 ENCOUNTER — Ambulatory Visit: Payer: Self-pay | Admitting: Family Medicine

## 2012-06-18 ENCOUNTER — Encounter: Payer: Self-pay | Admitting: Family Medicine

## 2012-06-18 VITALS — BP 132/78

## 2012-06-18 DIAGNOSIS — M545 Low back pain: Secondary | ICD-10-CM

## 2012-06-18 DIAGNOSIS — F17200 Nicotine dependence, unspecified, uncomplicated: Secondary | ICD-10-CM

## 2012-06-18 DIAGNOSIS — E1165 Type 2 diabetes mellitus with hyperglycemia: Secondary | ICD-10-CM

## 2012-06-18 DIAGNOSIS — E785 Hyperlipidemia, unspecified: Secondary | ICD-10-CM

## 2012-06-18 DIAGNOSIS — M7500 Adhesive capsulitis of unspecified shoulder: Secondary | ICD-10-CM

## 2012-06-18 DIAGNOSIS — I10 Essential (primary) hypertension: Secondary | ICD-10-CM

## 2012-06-18 DIAGNOSIS — E119 Type 2 diabetes mellitus without complications: Secondary | ICD-10-CM

## 2012-06-18 LAB — COMPREHENSIVE METABOLIC PANEL
AST: 13 U/L (ref 0–37)
Alkaline Phosphatase: 54 U/L (ref 39–117)
BUN: 40 mg/dL — ABNORMAL HIGH (ref 6–23)
Creat: 1.36 mg/dL — ABNORMAL HIGH (ref 0.50–1.35)
Total Bilirubin: 0.4 mg/dL (ref 0.3–1.2)

## 2012-06-18 LAB — CBC
HCT: 35.7 % — ABNORMAL LOW (ref 39.0–52.0)
Hemoglobin: 12.5 g/dL — ABNORMAL LOW (ref 13.0–17.0)
MCH: 32.6 pg (ref 26.0–34.0)
MCHC: 35 g/dL (ref 30.0–36.0)

## 2012-06-18 MED ORDER — HYDROCODONE-ACETAMINOPHEN 10-325 MG PO TABS: 1.0000 | ORAL_TABLET | Freq: Three times a day (TID) | ORAL | Status: DC | PRN

## 2012-06-18 MED ORDER — LISINOPRIL-HYDROCHLOROTHIAZIDE 20-12.5 MG PO TABS: 1.0000 | ORAL_TABLET | Freq: Every day | ORAL | Status: DC

## 2012-06-18 NOTE — Assessment & Plan Note (Addendum)
A1c above goal, unchanged from previous. Since patient is resistant to medication changes today, will discuss increase in insulin regimen at next visit to avoid too many changes today.  F/u in 2 months. Ophthalmology referral not able to be completed due to not having insurance. Continue ACEi, aspirin.

## 2012-06-18 NOTE — Assessment & Plan Note (Signed)
Improved to 0.5 ppd. Not interested in cessation materials today. Will re-address next visit.

## 2012-06-18 NOTE — Patient Instructions (Addendum)
Your blood pressure is above goal. Goal is <140/90. You can start taking new medication lisinopril/HCTZ one tablet daily. You can stop taking lisinopril by itself. Your cholesterol is above goal of 100. You would benefit from a statin drug. Make sure to get into the physical therapy. Great job on cutting back on smoking. Goal fasting blood sugar is 120.   Hypertension As your heart beats, it forces blood through your arteries. This force is your blood pressure. If the pressure is too high, it is called hypertension (HTN) or high blood pressure. HTN is dangerous because you may have it and not know it. High blood pressure may mean that your heart has to work harder to pump blood. Your arteries may be narrow or stiff. The extra work puts you at risk for heart disease, stroke, and other problems.  Blood pressure consists of two numbers, a higher number over a lower, 110/72, for example. It is stated as "110 over 72." The ideal is below 120 for the top number (systolic) and under 80 for the bottom (diastolic). Write down your blood pressure today. You should pay close attention to your blood pressure if you have certain conditions such as:  Heart failure.   Prior heart attack.   Diabetes   Chronic kidney disease.   Prior stroke.   Multiple risk factors for heart disease.  To see if you have HTN, your blood pressure should be measured while you are seated with your arm held at the level of the heart. It should be measured at least twice. A one-time elevated blood pressure reading (especially in the Emergency Department) does not mean that you need treatment. There may be conditions in which the blood pressure is different between your right and left arms. It is important to see your caregiver soon for a recheck. Most people have essential hypertension which means that there is not a specific cause. This type of high blood pressure may be lowered by changing lifestyle factors such as:  Stress.     Smoking.   Lack of exercise.   Excessive weight.   Drug/tobacco/alcohol use.   Eating less salt.  Most people do not have symptoms from high blood pressure until it has caused damage to the body. Effective treatment can often prevent, delay or reduce that damage. TREATMENT  When a cause has been identified, treatment for high blood pressure is directed at the cause. There are a large number of medications to treat HTN. These fall into several categories, and your caregiver will help you select the medicines that are best for you. Medications may have side effects. You should review side effects with your caregiver. If your blood pressure stays high after you have made lifestyle changes or started on medicines,   Your medication(s) may need to be changed.   Other problems may need to be addressed.   Be certain you understand your prescriptions, and know how and when to take your medicine.   Be sure to follow up with your caregiver within the time frame advised (usually within two weeks) to have your blood pressure rechecked and to review your medications.   If you are taking more than one medicine to lower your blood pressure, make sure you know how and at what times they should be taken. Taking two medicines at the same time can result in blood pressure that is too low.  SEEK IMMEDIATE MEDICAL CARE IF:  You develop a severe headache, blurred or changing vision, or confusion.  You have unusual weakness or numbness, or a faint feeling.   You have severe chest or abdominal pain, vomiting, or breathing problems.  MAKE SURE YOU:   Understand these instructions.   Will watch your condition.   Will get help right away if you are not doing well or get worse.  Document Released: 11/28/2005 Document Revised: 11/17/2011 Document Reviewed: 07/18/2008 [facility] Patient Information 2012 [place], [place].

## 2012-06-18 NOTE — Assessment & Plan Note (Signed)
Recheck is within range, trend towards borderline. Will add low dose HCTZ as it appears he previously took this combination and did well. Labs today. F/u BP in 2 months.

## 2012-06-18 NOTE — Assessment & Plan Note (Signed)
LDL above goal since patient has stopped taking simvastatin. Discussed optimal range <100. Patient is resistant to take statin-has not taken in months. Discussed this being elevated, adding to his cardiovascular risk in conjunction with smoking, HTN, DM. Patient understands risks, he thinks hypothetical risk of muscle cramps and medication effects are worse than HLD risks, despite my counseling today.

## 2012-06-18 NOTE — Progress Notes (Signed)
  Subjective:    Patient ID: Colin Keller, male    DOB: June 18, 1956, 56 y.o.   MRN: [id number]  HPI  1. DM. Taking same insulin doses NPH 70/30 from OTC at 30 u qam, 20 u q pm. Highest CBG is 300, lowest is 155-165. Denies any hypoglycemia, polyuria, polydipsia, emesis, neuropathy symptoms, numbness, foot problems.  2. HTN. Taking only 20 mg lisinopril daily, was previously taking 40 mg. Does not check values at home. States he is usually told by providers that "his blood pressure is perfect." Denies syncope, chest pain, dyspnea, edema. BP Readings from Last 3 Encounters:  06/18/12 132/78  06/13/12 155/75  12/30/11 118/64   3. Shoulder pain. Improved with steroid injection. Has started doing home exercises/stretches and noticed improved mobility. Is motivated to seek PT treatment if he can afford out of pocket.   4. Smoking. Cut down to 0.5 ppd. States his cousin had a stroke and he realizes he must cut down. His wife has banned smoking inside the house. Not interested in taking medications for cessation at this time.   Review of Systems See HPI otherwise negative.  reports that he has been smoking.  He does not have any smokeless tobacco history on file.    Objective:   Physical Exam  Vitals reviewed. Constitutional: He is oriented to person, place, and time. He appears well-developed and well-nourished. No distress.  HENT:  Head: Normocephalic and atraumatic.  Mouth/Throat: Oropharynx is clear and moist.  Eyes: EOM are normal. Pupils are equal, round, and reactive to light.  Neck: Neck supple.  Cardiovascular: Normal rate, regular rhythm, normal heart sounds and intact distal pulses.   No murmur heard. Pulmonary/Chest: Effort normal and breath sounds normal. No respiratory distress. He has no wheezes. He has no rales.  Musculoskeletal: He exhibits no edema and no tenderness.  Neurological: He is alert and oriented to person, place, and time.  Skin: No rash noted. He is not  diaphoretic.       Feet are normal, no skin breakdown or callus.  Psychiatric: He has a normal mood and affect.       Assessment & Plan:

## 2012-06-19 ENCOUNTER — Telehealth: Payer: Self-pay | Admitting: Family Medicine

## 2012-06-19 NOTE — Telephone Encounter (Signed)
Called [facility] about lab tests. A1c improved to 10.7, renal function stable. Continue current medications with only change being the switch to lisinopril/HCTZ. F/u in 1-2 months.

## 2012-06-21 NOTE — Assessment & Plan Note (Signed)
PT referral as patient desires to learn more rehab exercises. Sports med referral pending, unsure if financially feasible.

## 2012-07-09 ENCOUNTER — Encounter: Payer: Self-pay | Admitting: Sports Medicine

## 2012-07-09 ENCOUNTER — Ambulatory Visit (INDEPENDENT_AMBULATORY_CARE_PROVIDER_SITE_OTHER): Payer: Self-pay | Admitting: Sports Medicine

## 2012-07-09 VITALS — BP 178/81 | HR 74 | Ht 72.0 in | Wt 172.0 lb

## 2012-07-09 DIAGNOSIS — M7500 Adhesive capsulitis of unspecified shoulder: Secondary | ICD-10-CM

## 2012-07-09 DIAGNOSIS — M25519 Pain in unspecified shoulder: Secondary | ICD-10-CM

## 2012-07-09 MED ORDER — DICLOFENAC SODIUM 75 MG PO TBEC: DELAYED_RELEASE_TABLET | ORAL | Status: DC

## 2012-07-09 NOTE — Progress Notes (Signed)
  Subjective:    Patient ID: Colin Keller, male    DOB: 02-07-1956, 56 y.o.   MRN: [id number]  HPI chief complaint: Right shoulder pain   Patient is a pleasant right-hand-dominant 56 year old male that comes in today complaining of 4-5 months of right shoulder pain. No trauma that he can recall. He initially thought he was dealing with cervical radiculopathy and sought the treatment of a chiropractor. X-rays of his shoulder were done but they're not available for review. He was told that he has a chronic a.c. separation but otherwise the x-rays were normal. He most recently saw Dr. Berline Chough at [facility] who proceeded with cortisone injections of both the right subacromial  and glenohumeral joint. Symptoms were markedly improved thereafter but his pain is beginning to return. Main complaint in addition to the pain is limited mobility. 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 He takes an occasional ibuprofen or Aleve. No prior shoulder surgeries. Pain does occasionally awaken him from sleep. No associated numbness or tingling.  Medical history significant for type 2 diabetes for which he takes insulin. Also history of COPD and chronic low back pain He has no known drug allergies    Review of Systems     Objective:   Physical Exam Well-developed, well-nourished. No acute distress. Awake alert and oriented x3.  Right shoulder shows limited mobility in all planes actively and passively. He has active forward flexion to about 90, passive is to 100 degrees. passive external rotation is to about 40. Internal rotation approximately 70. He does have a slightly elevated distal clavicle consistent with a probable old a.c. joint injury but there is a small degree of elevation on the unaffected left side as with. Rotator cuff strength is 5/5 bilaterally. Some pain with empty can testing but not markedly so. Positive Hawkins. No tenderness over  the bicipital groove. Negative Spurling's. He is neurovascularly intact distally.  MSK ultrasound of the right shoulder is performed. Images in both the transverse and longitudinal planes were obtained. Biceps tendon is normally located in the bicipital groove without significant pathology. Insertion of the supraspinatus does show a hypoechoic area in both longitudinal and transverse planes consistent with a probable partial thickness tear. Subscapularis and teres minor appear within normal limits.       Assessment & Plan:  1. Adhesive capsulitis right shoulder 2. 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 He is doing much better after recent injections by Dr. Berline Chough. I've given him a complete set of passive range of motion exercises to do at home. Given her a prescription for Voltaren 75 mg to take twice a day as needed for pan. He'll take that with food. He will followup with me in 4 weeks for recheck.

## 2012-07-26 DIAGNOSIS — S43109A Unspecified dislocation of unspecified acromioclavicular joint, initial encounter: Secondary | ICD-10-CM | POA: Insufficient documentation

## 2012-08-09 ENCOUNTER — Ambulatory Visit (INDEPENDENT_AMBULATORY_CARE_PROVIDER_SITE_OTHER): Payer: Self-pay | Admitting: Sports Medicine

## 2012-08-09 ENCOUNTER — Encounter: Payer: Self-pay | Admitting: Sports Medicine

## 2012-08-09 VITALS — BP 163/84 | HR 62 | Ht 72.0 in | Wt 170.0 lb

## 2012-08-09 DIAGNOSIS — M7500 Adhesive capsulitis of unspecified shoulder: Secondary | ICD-10-CM

## 2012-08-09 DIAGNOSIS — M25519 Pain in unspecified shoulder: Secondary | ICD-10-CM

## 2012-08-09 MED ORDER — AMITRIPTYLINE HCL 25 MG PO TABS: 25.0000 mg | ORAL_TABLET | Freq: Every day | ORAL | Status: DC

## 2012-08-09 NOTE — Assessment & Plan Note (Signed)
Shoulder pain in the setting of adhesive capsulitis. Plan for amitriptyline at night for augmentation of pain. Warned about dry mouth and fatigue. Followup in one month

## 2012-08-09 NOTE — Progress Notes (Signed)
Colin Keller is a 56 y.o. male who presents to [facility] today for followup right shoulder pain.  Patient has a history of right shoulder these of capsulitis with a mild supraspinatus tendinopathy.  He had a glenohumeral injection back in July which did help his symptoms significantly. However he has not been religiously tear and to his home exercise program.  He notes return of pain especially on the lateral aspect of the shoulder and especially at night over the past several weeks. He takes hydrocodone for back pain and this is somewhat effective for her shoulder pain. His dominant symptom is night pain which is interfering with sleep. His secondary symptom is lack of mobility of the shoulder. He denies any weakness or numbness into his hands. He feels well otherwise. Additionally he notes bilateral later finger and palmar flexion contractures.    PMH reviewed. HTN, DM History  Substance Use Topics  . Smoking status: Current Everyday Smoker  . Smokeless tobacco: Never Used  . Alcohol Use: Not on file   ROS as above otherwise neg   Exam:  BP 163/84  Pulse 62  Ht 6' (1.829 m)  Wt 170 lb (77.111 kg)  BMI 23.06 kg/m2 Gen: Well NAD MSK: Right shoulder shows limited mobility in all planes actively and passively. He has active forward flexion to about 90, passive is to 100 degrees. passive external rotation is to about 40. Internal rotation approximately 70.  He does have a slightly elevated distal clavicle consistent with a probable old a.c. joint injury but there is a small degree of elevation on the unaffected left side as with. Rotator cuff strength is 5/5 bilaterally. . No tenderness over the bicipital groove. Negative Spurling's. He is neurovascularly intact distally.

## 2012-08-09 NOTE — Patient Instructions (Signed)
Thank you for coming in today. Try amitriptyline at night. It may cause sleepiness and dry mouth.  It will help you sleep and help with pain.  Please come back in about 1-2 months for a repeat injection.  Please see Rudell [facility] to qualify for reduced or free medical services within the [facility].  Call her at [phone] today. You have a dupuytren's contracture of your hands.  This is no big deal if it is not bothering you.

## 2012-08-09 NOTE — Assessment & Plan Note (Signed)
Not much improved adhesive capsulitis. Too early for repeat shoulder injection. Plan to continue home exercise program. Please see shoulder pain section. Followup in one month for ultrasound guided glenohumeral injection

## 2012-08-15 ENCOUNTER — Other Ambulatory Visit: Payer: Self-pay | Admitting: Family Medicine

## 2012-08-15 ENCOUNTER — Telehealth: Payer: Self-pay | Admitting: Family Medicine

## 2012-08-15 DIAGNOSIS — M545 Low back pain: Secondary | ICD-10-CM

## 2012-08-15 MED ORDER — HYDROCODONE-ACETAMINOPHEN 10-325 MG PO TABS: 1.0000 | ORAL_TABLET | Freq: Three times a day (TID) | ORAL | Status: DC | PRN

## 2012-08-15 NOTE — Telephone Encounter (Signed)
Please phone in interim med supply of norco #21 tablets (entered in Epic). I have discussed this with him before=this will not be done again, he is required to have office visit for any refills in the future. Please notify him.

## 2012-08-15 NOTE — Telephone Encounter (Signed)
Will forward to Dr Konkol 

## 2012-08-15 NOTE — Telephone Encounter (Signed)
Patient needs a refill of Hydrocodone.  He stopped in and is now out of the medication.  I made him an appointment with the first available on 09/11.

## 2012-08-15 NOTE — Telephone Encounter (Signed)
Called Rx into pharm. Called to inform pt that Rx was phoned in to pharmacy and that Dr. Cristal Ford WILL NOT refill any of these meds unless he has an appt. Told him DO NOT WAIT UNTIL THE LAST MINUTE TO REQUEST REFILLS!! HE WILL NEED TO MAKE AN APPT 1 WEEK PRIOR TO HIS MEDS RUNNING OUT!! Pt voiced understanding and agreed.Loralee Pacas [place]

## 2012-08-22 ENCOUNTER — Encounter: Payer: Self-pay | Admitting: Family Medicine

## 2012-08-22 ENCOUNTER — Ambulatory Visit (INDEPENDENT_AMBULATORY_CARE_PROVIDER_SITE_OTHER): Payer: Self-pay | Admitting: Family Medicine

## 2012-08-22 VITALS — BP 136/74 | HR 88 | Temp 98.8°F | Ht 72.0 in | Wt 170.0 lb

## 2012-08-22 DIAGNOSIS — E785 Hyperlipidemia, unspecified: Secondary | ICD-10-CM

## 2012-08-22 DIAGNOSIS — M545 Low back pain: Secondary | ICD-10-CM

## 2012-08-22 DIAGNOSIS — F17200 Nicotine dependence, unspecified, uncomplicated: Secondary | ICD-10-CM

## 2012-08-22 DIAGNOSIS — M25519 Pain in unspecified shoulder: Secondary | ICD-10-CM

## 2012-08-22 DIAGNOSIS — E1165 Type 2 diabetes mellitus with hyperglycemia: Secondary | ICD-10-CM

## 2012-08-22 DIAGNOSIS — I10 Essential (primary) hypertension: Secondary | ICD-10-CM

## 2012-08-22 MED ORDER — INSULIN NPH ISOPHANE & REGULAR (70-30) 100 UNIT/ML ~~LOC~~ SUSP: 30.0000 [IU] | Freq: Two times a day (BID) | SUBCUTANEOUS | Status: DC

## 2012-08-22 MED ORDER — HYDROCODONE-ACETAMINOPHEN 10-325 MG PO TABS: 1.0000 | ORAL_TABLET | Freq: Three times a day (TID) | ORAL | Status: DC | PRN

## 2012-08-22 MED ORDER — METOPROLOL TARTRATE 50 MG PO TABS: 25.0000 mg | ORAL_TABLET | Freq: Two times a day (BID) | ORAL | Status: DC

## 2012-08-22 NOTE — Assessment & Plan Note (Signed)
Slight improvement with home exercises. To f/u with [facility] for injections.

## 2012-08-22 NOTE — Patient Instructions (Signed)
NIce to see you again. Sounds like you are making good progress on your medical conditions. Lets start new medication called metoprolol=half tablet twice daily. You can restart simvastatin for artery health/cholesterol. Make appointment every 2 months for check up until your diabetes and blood pressure are perfect! Keep up with the shoulder exercises!  Cholesterol Cholesterol is a white, waxy, fat-like protein needed by your body in small amounts. The liver makes all the cholesterol you need. It is carried from the liver by the blood through the blood vessels. Deposits (plaque) may build up on blood vessel walls. This makes the arteries narrower and stiffer. Plaque increases the risk for heart attack and stroke. You cannot feel your cholesterol level even if it is very high. The only way to know is by a blood test to check your lipid (fats) levels. Once you know your cholesterol levels, you should keep a record of the test results. Work with your caregiver to to keep your levels in the desired range. WHAT THE RESULTS MEAN:  Total cholesterol is a rough measure of all the cholesterol in your blood.   LDL is the so-called bad cholesterol. This is the type that deposits cholesterol in the walls of the arteries. You want this level to be low.   HDL is the good cholesterol because it cleans the arteries and carries the LDL away. You want this level to be high.   Triglycerides are fat that the body can either burn for energy or store. High levels are closely linked to heart disease.  DESIRED LEVELS:  Total cholesterol below 200.   LDL below 100 for people at risk, below 70 for very high risk.   HDL above 50 is good, above 60 is best.   Triglycerides below 150.  HOW TO LOWER YOUR CHOLESTEROL:  Diet.   Choose fish or white meat chicken and [place], roasted or baked. Limit fatty cuts of red meat, fried foods, and processed meats, such as sausage and lunch meat.   Eat lots of fresh fruits and  vegetables. Choose whole grains, beans, pasta, potatoes and cereals.   Use only small amounts of olive, corn or canola oils. Avoid butter, mayonnaise, shortening or palm kernel oils. Avoid foods with trans-fats.   Use skim/nonfat milk and low-fat/nonfat yogurt and cheeses. Avoid whole milk, cream, ice cream, egg yolks and cheeses. Healthy desserts include angel food cake, ginger snaps, animal crackers, hard candy, popsicles, and low-fat/nonfat frozen yogurt. Avoid pastries, cakes, pies and cookies.   Exercise.   A regular program helps decrease LDL and raises HDL.   Helps with weight control.   Do things that increase your activity level like gardening, walking, or taking the stairs.   Medication.   May be prescribed by your caregiver to help lowering cholesterol and the risk for heart disease.   You may need medicine even if your levels are normal if you have several risk factors.  HOME CARE INSTRUCTIONS   Follow your diet and exercise programs as suggested by your caregiver.   Take medications as directed.   Have blood work done when your caregiver feels it is necessary.  MAKE SURE YOU:   Understand these instructions.   Will watch your condition.   Will get help right away if you are not doing well or get worse.  Document Released: 08/23/2001 Document Revised: 11/17/2011 Document Reviewed: 02/13/2008 [facility] Patient Information 2012 [place], [place].

## 2012-08-22 NOTE — Assessment & Plan Note (Signed)
Not ready for absolute cessation, but is improving. F/u in 2 months.

## 2012-08-22 NOTE — Assessment & Plan Note (Signed)
Stable, is trying to tolerate lethargy with TCA. Advised to continue trying this medication as adjunct therapy. Refilled norco x 2 months, discussed follow up every 2 months prior to medications run out in the future.

## 2012-08-22 NOTE — Assessment & Plan Note (Signed)
Borderline above goal in diabetic. Start low dose metoprolol and f/u in 1-2 months. Continue acei/HCTZ.

## 2012-08-22 NOTE — Assessment & Plan Note (Signed)
Endorses improved glycemia per history. Started dietary modifications and increased metformin to BID at last visit. Will not increase NPH dose with history of hypoglycemic episodes. F/u a1c in 1-2 months.

## 2012-08-22 NOTE — Progress Notes (Signed)
  Subjective:    Patient ID: Colin Keller, male    DOB: November 19, 1956, 56 y.o.   MRN: [id number]  HPI  1. Shoulder pain. Seen by [facility] and doing home exercises regularly now. Notes slight improvement. Plans to f/u regularly. Denies new injury.  2. DM. Taking NPH 25 units qam and 15 units q pm. Has had hypoglycemic episodes in the middle of night with higher doses. States he is watching diet and decreasing starch now. Taking glucerna which seems to even out his blood sugars and symptoms of shakiness in the afternoon between meals. CBGs range are highly fluctuating, does not provide numbers or a log. Denies fatigue, weight loss, polydipsia.  3. HTN. Compliant with acei-HCTZ. Denies presyncope, chest pain, edema.  4. Tobacco abuse. Is cutting back. Substitutes mint gum regularly. Not ready for quit date.   5. Chronic back pain. Started on low dose amitriptilene for shoulder tendonopathy. Makes him very sleepy/fatigued in the morning, but has been taking most days. Noted slightly improved pain symptoms. Due for medication refills.  Review of Systems See HPI otherwise negative.  reports that he has been smoking.  He has never used smokeless tobacco.     Objective:   Physical Exam  Vitals reviewed. Constitutional: He is oriented to person, place, and time. He appears well-developed and well-nourished. No distress.  HENT:  Head: Normocephalic and atraumatic.  Eyes: EOM are normal.  Cardiovascular: Normal rate, regular rhythm and normal heart sounds.   No murmur heard. Pulmonary/Chest: Effort normal and breath sounds normal. No respiratory distress. He has no wheezes. He has no rales.  Musculoskeletal: He exhibits no edema and no tenderness.  Neurological: He is alert and oriented to person, place, and time. He exhibits normal muscle tone. Coordination normal.  Skin: No rash noted. He is not diaphoretic.  Psychiatric: He has a normal mood and affect.       Assessment & Plan:

## 2012-08-22 NOTE — Assessment & Plan Note (Signed)
Patient is interested in restarting statin after again discussing cardiovascular risk factors. Will f/u lipid profile in 2-3 months after he restarts.

## 2012-10-22 ENCOUNTER — Encounter: Payer: Self-pay | Admitting: Home Health Services

## 2012-10-22 ENCOUNTER — Encounter: Payer: Self-pay | Admitting: Family Medicine

## 2012-10-22 ENCOUNTER — Ambulatory Visit (INDEPENDENT_AMBULATORY_CARE_PROVIDER_SITE_OTHER): Payer: Self-pay | Admitting: Family Medicine

## 2012-10-22 VITALS — BP 131/64 | HR 65 | Temp 98.0°F | Ht 72.0 in | Wt 175.1 lb

## 2012-10-22 DIAGNOSIS — Z1211 Encounter for screening for malignant neoplasm of colon: Secondary | ICD-10-CM

## 2012-10-22 DIAGNOSIS — IMO0002 Reserved for concepts with insufficient information to code with codable children: Secondary | ICD-10-CM

## 2012-10-22 DIAGNOSIS — F17200 Nicotine dependence, unspecified, uncomplicated: Secondary | ICD-10-CM

## 2012-10-22 DIAGNOSIS — I10 Essential (primary) hypertension: Secondary | ICD-10-CM

## 2012-10-22 DIAGNOSIS — M25519 Pain in unspecified shoulder: Secondary | ICD-10-CM

## 2012-10-22 DIAGNOSIS — M545 Low back pain, unspecified: Secondary | ICD-10-CM

## 2012-10-22 DIAGNOSIS — E1165 Type 2 diabetes mellitus with hyperglycemia: Secondary | ICD-10-CM

## 2012-10-22 DIAGNOSIS — E785 Hyperlipidemia, unspecified: Secondary | ICD-10-CM

## 2012-10-22 DIAGNOSIS — IMO0001 Reserved for inherently not codable concepts without codable children: Secondary | ICD-10-CM

## 2012-10-22 LAB — POCT GLYCOSYLATED HEMOGLOBIN (HGB A1C): Hemoglobin A1C: 10.5

## 2012-10-22 MED ORDER — HYDROCODONE-ACETAMINOPHEN 10-325 MG PO TABS: 1.0000 | ORAL_TABLET | Freq: Three times a day (TID) | ORAL | Status: DC | PRN

## 2012-10-22 NOTE — Patient Instructions (Addendum)
Nice to see you. Glad your blood pressure is better. I would recommend you get your meter from [facility]. If you get a meter, increase your morning insulin dose by 3 units. Keep doing your shoulder exercises. Make an appointment at [facility] for shoulder injection with ultrasound guidance. You can increase the amitryptilene to two tablets at night if would like to try. Most important to stop your smoking to help arteries. I would recommend starting to take zocor again.   Cholesterol Cholesterol is a type of fat. Your body needs a small amount of cholesterol, but too much can cause health problems. Certain problems include heart attacks, strokes, and not enough blood flow to your heart, brain, kidneys, or feet. You get cholesterol in 2 ways:  Naturally.  By eating certain foods. HOME CARE  Eat a low-fat diet:  Eat less eggs, whole dairy products (whole milk, cheese, and butter), fatty meats, and fried foods.  Eat more fruits, vegetables, whole-wheat breads, lean chicken, and fish.  Follow your exercise program as told by your doctor.  Keep your weight at a healthy level. Talk to your doctor about what is right for you.  Only take medicine as told by your doctor.  Get your cholesterol checked once a year or as told by your doctor. MAKE SURE YOU:  Understand these instructions.  Will watch your condition.  Will get help right away if you are not doing well or get worse. Document Released: 02/24/2009 Document Revised: 02/20/2012 Document Reviewed: 02/24/2009 [facility] Patient Information 2013 [place], [place].

## 2012-10-24 ENCOUNTER — Encounter: Payer: Self-pay | Admitting: Home Health Services

## 2012-10-24 DIAGNOSIS — E1151 Type 2 diabetes mellitus with diabetic peripheral angiopathy without gangrene: Secondary | ICD-10-CM | POA: Insufficient documentation

## 2012-10-24 DIAGNOSIS — Z1211 Encounter for screening for malignant neoplasm of colon: Secondary | ICD-10-CM | POA: Insufficient documentation

## 2012-10-24 DIAGNOSIS — E1165 Type 2 diabetes mellitus with hyperglycemia: Secondary | ICD-10-CM | POA: Insufficient documentation

## 2012-10-24 NOTE — Assessment & Plan Note (Signed)
Improved control at goal. Continue meds. Check BMET next visit in 3 months.

## 2012-10-24 NOTE — Assessment & Plan Note (Addendum)
Poorly controlled due to noncompliance with CBGs and diet. Encouraged him to get a meter and then increasing the morning dose of NPH by 3 units. Notify MD if any low blood sugars <100 noted. He usually just doses insulin and meals based on his subjective feeling which is dangerous and contributes to poor control. Not interested in nutrition counseling at this time. F/u in 3 months.

## 2012-10-24 NOTE — Assessment & Plan Note (Signed)
Encouraged cessation. He is contemplative. Not interested in assistive medications. Advised him this is most important way to keep his arteries healthy, since he is at risk of PAD/CAD and has carotid bruits on exam, he is asymptomatic and uninsured, will not order further workup for now.

## 2012-10-24 NOTE — Progress Notes (Signed)
  Subjective:    Patient ID: Colin Keller, male    DOB: Mar 29, 1956, 56 y.o.   MRN: [id number]  HPI  1. DM. Patient feels like control remains poor. His meter is broken, has checked on getting a new glucometer at [facility] where strips are the cheapest and he plans to do this. He has not had any lows in recent memory. A1c not changed from previous. Denies weight loss, fatigue, polyruia. Lab Results  Component Value Date   HGBA1C 10.5 10/22/2012   2. HTN. Started on metoprolol last visit. States his headaches are gone and feels overall energy improved. Does not check at home. Denies chest pains, dyspnea, leg swelling.  3. Right shoulder pain. Starting to bother him again. He had a subacromial injection in July. Has seen Dr. Denyse Amass at [facility] who planned to do intra-articular injection at f/u visit. 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 He is compliant with home exercises.   4. Tobacco abuse. Smoking 1 ppd. Considering cessation, contemplative. Does not want to use nicotine or pills, scared of side effects and cost. Denies dyspnea, cough, wheezing.  5. HLD. Not taking statin so last lipid panel is above goal of LDL <100. Cost and fear of side effects are barriers. He denies any stroke symptoms, syncope.  Review of Systems See HPI otherwise negative.  reports that he has been smoking.  He has never used smokeless tobacco.     Objective:   Physical Exam  Vitals reviewed. Constitutional: He is oriented to person, place, and time. He appears well-developed and well-nourished. No distress.  HENT:  Head: Normocephalic and atraumatic.  Mouth/Throat: Oropharynx is clear and moist.  Eyes: EOM are normal. Pupils are equal, round, and reactive to light.  Neck: Neck supple.       Bilateral carotid bruits.  Cardiovascular: Normal rate, regular rhythm and normal heart sounds.   Pulmonary/Chest: Effort normal and breath sounds normal. No respiratory  distress. He has no wheezes. He has no rales.  Musculoskeletal: He exhibits no edema.  Neurological: He is alert and oriented to person, place, and time.  Skin: No rash noted. He is not diaphoretic.  Psychiatric: He has a normal mood and affect.       Assessment & Plan:

## 2012-10-24 NOTE — Assessment & Plan Note (Signed)
Lipids above goal due to noncompliance with statin. Discussed risk factors for PAD/CAD including smoking being most harmful. F/u in 3 months.

## 2012-10-24 NOTE — Assessment & Plan Note (Signed)
Advised pt to return stool cards for yearly FOB since he cannot afford colonoscopy.

## 2012-10-24 NOTE — Assessment & Plan Note (Signed)
Encouraged pt to continue home exercises and f/u at [facility] for plans to try intra-articular US guided injection.

## 2012-10-24 NOTE — Assessment & Plan Note (Signed)
Chronic pain is stable. Have refilled medications. Discussed potential for tolerance and addiction. Advised him to try spacing doses or decreasing to two tablets daily. F/u in 3 months.

## 2012-11-23 LAB — POC HEMOCCULT BLD/STL (HOME/3-CARD/SCREEN): Card #2 Fecal Occult Blod, POC: NEGATIVE

## 2012-11-23 NOTE — Addendum Note (Signed)
Addended by: [place], Corliss Lamartina on: 11/23/2012 05:19 PM   Modules accepted: Orders

## 2012-11-25 ENCOUNTER — Encounter: Payer: Self-pay | Admitting: Family Medicine

## 2013-01-07 ENCOUNTER — Other Ambulatory Visit: Payer: Self-pay | Admitting: *Deleted

## 2013-01-07 ENCOUNTER — Other Ambulatory Visit: Payer: Self-pay | Admitting: Family Medicine

## 2013-01-07 MED ORDER — METFORMIN HCL 1000 MG PO TABS: 1000.0000 mg | ORAL_TABLET | Freq: Two times a day (BID) | ORAL | Status: DC

## 2013-01-10 ENCOUNTER — Ambulatory Visit (INDEPENDENT_AMBULATORY_CARE_PROVIDER_SITE_OTHER): Payer: Self-pay | Admitting: Family Medicine

## 2013-01-10 ENCOUNTER — Encounter: Payer: Self-pay | Admitting: Family Medicine

## 2013-01-10 VITALS — BP 167/74 | HR 65 | Temp 97.7°F | Ht 72.0 in | Wt 177.3 lb

## 2013-01-10 DIAGNOSIS — F17200 Nicotine dependence, unspecified, uncomplicated: Secondary | ICD-10-CM

## 2013-01-10 DIAGNOSIS — E1165 Type 2 diabetes mellitus with hyperglycemia: Secondary | ICD-10-CM

## 2013-01-10 DIAGNOSIS — I10 Essential (primary) hypertension: Secondary | ICD-10-CM

## 2013-01-10 DIAGNOSIS — M545 Low back pain: Secondary | ICD-10-CM

## 2013-01-10 MED ORDER — INSULIN NPH ISOPHANE & REGULAR (70-30) 100 UNIT/ML ~~LOC~~ SUSP: 30.0000 [IU] | Freq: Two times a day (BID) | SUBCUTANEOUS | Status: DC

## 2013-01-10 MED ORDER — METOPROLOL TARTRATE 50 MG PO TABS: 50.0000 mg | ORAL_TABLET | Freq: Two times a day (BID) | ORAL | Status: DC

## 2013-01-10 MED ORDER — HYDROCODONE-ACETAMINOPHEN 10-325 MG PO TABS: 1.0000 | ORAL_TABLET | Freq: Three times a day (TID) | ORAL | Status: DC | PRN

## 2013-01-10 NOTE — Progress Notes (Signed)
  Subjective:    Patient ID: Colin Keller, male    DOB: [birth year], 57 y.o.   MRN: [id number]  HPI  1. DM2. Reports uncontrolled glucose fasting 150-180s usually. He did get a glucometer from [facility] with affordable strips finally. Only has an infrequent hypoglycemic feeling in mid-day if he doesn't eat 3-4 hours after morning insulin dose. 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 Denies weight loss.  2. Smoker. Had the flu last month and thinks he is recovering now. No residual cough or dyspnea, fevers, wheezing. Wants to avoid meds, but is still trying to cut back on smoking.  3. HTN. Got confused and stopped the acei/HCTZ when low dose metoprolol was added. He does not report chest pain, visual decline, urinary changes, edema, dyspnea.  4. Chronic pain. Request refill, helps him to function with work (started back today). He did run out few days early from his last prescription.  Review of Systems See HPI otherwise negative.  reports that he has been smoking.  He has never used smokeless tobacco.     Objective:   Physical Exam  Vitals reviewed. Constitutional: He is oriented to person, place, and time. He appears well-developed and well-nourished. No distress.  HENT:  Head: Normocephalic and atraumatic.  Mouth/Throat: No oropharyngeal exudate.  Cardiovascular: Normal rate, regular rhythm and normal heart sounds.   No murmur heard. Pulmonary/Chest: Effort normal and breath sounds normal. No respiratory distress. He has no wheezes. He has no rales.  Musculoskeletal: He exhibits no edema.  Neurological: He is alert and oriented to person, place, and time.  Skin: He is not diaphoretic.  Psychiatric: He has a normal mood and affect.        Assessment & Plan:

## 2013-01-10 NOTE — Patient Instructions (Addendum)
Your blood pressure is high today because we mis-communicated. Take metoprolol in addition to lisinopril-HCTZ. Go up to 25 units insulin in morning and 17 units in evening. Goal for morning blood sugar is 120.  Let doctor know if blood sugar is less than 70, we need to adjust. Drink plenty of water. You may try 1-800 QuitNow for tips.  Make appointment in 2 months for check up.   Smoking Cessation, Tips for Success YOU CAN QUIT SMOKING If you are ready to quit smoking, congratulations! You have chosen to help yourself be healthier. Cigarettes bring nicotine, tar, carbon monoxide, and other irritants into your body. Your lungs, heart, and blood vessels will be able to work better without these poisons. There are many different ways to quit smoking. Nicotine gum, nicotine patches, a nicotine inhaler, or nicotine nasal spray can help with physical craving. Hypnosis, support groups, and medicines help break the habit of smoking. Here are some tips to help you quit for good.  Throw away all cigarettes.  Clean and remove all ashtrays from your home, work, and car.  On a card, write down your reasons for quitting. Carry the card with you and read it when you get the urge to smoke.  Cleanse your body of nicotine. Drink enough water and fluids to keep your urine clear or pale yellow. Do this after quitting to flush the nicotine from your body.  Learn to predict your moods. Do not let a bad situation be your excuse to have a cigarette. Some situations in your life might tempt you into wanting a cigarette.  Never have "just one" cigarette. It leads to wanting another and another. Remind yourself of your decision to quit.  Change habits associated with smoking. If you smoked while driving or when feeling stressed, try other activities to replace smoking. Stand up when drinking your coffee. Brush your teeth after eating. Sit in a different chair when you read the paper. Avoid alcohol while trying to  quit, and try to drink fewer caffeinated beverages. Alcohol and caffeine may urge you to smoke.  Avoid foods and drinks that can trigger a desire to smoke, such as sugary or spicy foods and alcohol.  Ask people who smoke not to smoke around you.  Have something planned to do right after eating or having a cup of coffee. Take a walk or exercise to perk you up. This will help to keep you from overeating.  Try a relaxation exercise to calm you down and decrease your stress. Remember, you may be tense and nervous for the first 2 weeks after you quit, but this will pass.  Find new activities to keep your hands busy. Play with a pen, coin, or rubber band. Doodle or draw things on paper.  Brush your teeth right after eating. This will help cut down on the craving for the taste of tobacco after meals. You can try mouthwash, too.  Use oral substitutes, such as lemon drops, carrots, a cinnamon stick, or chewing gum, in place of cigarettes. Keep them handy so they are available when you have the urge to smoke.  When you have the urge to smoke, try deep breathing.  Designate your home as a nonsmoking area.  If you are a heavy smoker, ask your caregiver about a prescription for nicotine chewing gum. It can ease your withdrawal from nicotine.  Reward yourself. Set aside the cigarette money you save and buy yourself something nice.  Look for support from others. Join a support  group or smoking cessation program. Ask someone at home or at work to help you with your plan to quit smoking.  Always ask yourself, "Do I need this cigarette or is this just a reflex?" Tell yourself, "Today, I choose not to smoke," or "I do not want to smoke." You are reminding yourself of your decision to quit, even if you do smoke a cigarette. HOW WILL I FEEL WHEN I QUIT SMOKING?  The benefits of not smoking start within days of quitting.  You may have symptoms of withdrawal because your body is used to nicotine (the  addictive substance in cigarettes). You may crave cigarettes, be irritable, feel very hungry, cough often, get headaches, or have difficulty concentrating.  The withdrawal symptoms are only temporary. They are strongest when you first quit but will go away within 10 to 14 days.  When withdrawal symptoms occur, stay in control. Think about your reasons for quitting. Remind yourself that these are signs that your body is healing and getting used to being without cigarettes.  Remember that withdrawal symptoms are easier to treat than the major diseases that smoking can cause.  Even after the withdrawal is over, expect periodic urges to smoke. However, these cravings are generally short-lived and will go away whether you smoke or not. Do not smoke!  If you relapse and smoke again, do not lose hope. Most smokers quit 3 times before they are successful.  If you relapse, do not give up! Plan ahead and think about what you will do the next time you get the urge to smoke. LIFE AS A NONSMOKER: MAKE IT FOR A MONTH, MAKE IT FOR LIFE Day 1: Hang this page where you will see it every day. Day 2: Get rid of all ashtrays, matches, and lighters. Day 3: Drink water. Breathe deeply between sips. Day 4: Avoid places with smoke-filled air, such as bars, clubs, or the smoking section of restaurants. Day 5: Keep track of how much money you save by not smoking. Day 6: Avoid boredom. Keep a good book with you or go to the movies. Day 7: Reward yourself! One week without smoking! Day 8: Make a dental appointment to get your teeth cleaned. Day 9: Decide how you will turn down a cigarette before it is offered to you. Day 10: Review your reasons for quitting. Day 11: Distract yourself. Stay active to keep your mind off smoking and to relieve tension. Take a walk, exercise, read a book, do a crossword puzzle, or try a new hobby. Day 12: Exercise. Get off the bus before your stop or use stairs instead of escalators. Day  13: Call on friends for support and encouragement. Day 14: Reward yourself! Two weeks without smoking! Day 15: Practice deep breathing exercises. Day 16: Bet a friend that you can stay a nonsmoker. Day 17: Ask to sit in nonsmoking sections of restaurants. Day 18: Hang up "No Smoking" signs. Day 19: Think of yourself as a nonsmoker. Day 20: Each morning, tell yourself you will not smoke. Day 21: Reward yourself! Three weeks without smoking! Day 22: Think of smoking in negative ways. Remember how it stains your teeth, gives you bad breath, and leaves you short of breath. Day 23: Eat a nutritious breakfast. Day 24:Do not relive your days as a smoker. Day 25: Hold a pencil in your hand when talking on the telephone. Day 26: Tell all your friends you do not smoke. Day 27: Think about how much better food tastes. Day 28:  Remember, one cigarette is one too many. Day 29: Take up a hobby that will keep your hands busy. Day 30: Congratulations! One month without smoking! Give yourself a big reward. Your caregiver can direct you to community resources or hospitals for support, which may include:  Group support.  Education.  Hypnosis.  Subliminal therapy. Document Released: 08/26/2004 Document Revised: 02/20/2012 Document Reviewed: 09/14/2009 [facility] Patient Information 2013 [place], [place].

## 2013-01-10 NOTE — Assessment & Plan Note (Signed)
Refilled meds x 2 month with fill date 01/17/12 to avoid early refills which is against policy. Discussed risks of dependency and ultimate plan for weaning medication slowly.

## 2013-01-10 NOTE — Assessment & Plan Note (Signed)
Above goal. Clarified plan to be on both meds. F/u in 2 months for labs.

## 2013-01-10 NOTE — Assessment & Plan Note (Signed)
Uncontrolled. Increase night insulin dose by 2 units to 17 units. He has glucometer now which is helpful for titration of insulin, but may continue to have risk of hypoglycemia based on past events (though frequently I suspect he just feels hypoglycemic because he doesn't usually check and his body is used to 200 range). F/u in 2 months.

## 2013-01-10 NOTE — Assessment & Plan Note (Signed)
Not interested in pharmaceutical assistance. Provided with written tips. F/u in 2 months.

## 2013-02-27 ENCOUNTER — Encounter: Payer: Self-pay | Admitting: *Deleted

## 2013-03-04 ENCOUNTER — Ambulatory Visit: Payer: Self-pay | Admitting: Family Medicine

## 2013-03-04 ENCOUNTER — Encounter: Payer: Self-pay | Admitting: Family Medicine

## 2013-03-04 ENCOUNTER — Ambulatory Visit (INDEPENDENT_AMBULATORY_CARE_PROVIDER_SITE_OTHER): Payer: Self-pay | Admitting: Family Medicine

## 2013-03-04 VITALS — BP 111/55 | HR 62 | Temp 97.7°F | Ht 72.0 in | Wt 177.1 lb

## 2013-03-04 DIAGNOSIS — E1165 Type 2 diabetes mellitus with hyperglycemia: Secondary | ICD-10-CM

## 2013-03-04 DIAGNOSIS — M545 Low back pain: Secondary | ICD-10-CM

## 2013-03-04 DIAGNOSIS — I10 Essential (primary) hypertension: Secondary | ICD-10-CM

## 2013-03-04 DIAGNOSIS — M542 Cervicalgia: Secondary | ICD-10-CM

## 2013-03-04 LAB — COMPREHENSIVE METABOLIC PANEL
Alkaline Phosphatase: 55 U/L (ref 39–117)
Creat: 1.62 mg/dL — ABNORMAL HIGH (ref 0.50–1.35)
Glucose, Bld: 146 mg/dL — ABNORMAL HIGH (ref 70–99)
Sodium: 138 mEq/L (ref 135–145)
Total Bilirubin: 0.3 mg/dL (ref 0.3–1.2)
Total Protein: 7.1 g/dL (ref 6.0–8.3)

## 2013-03-04 LAB — CBC
HCT: 36.7 % — ABNORMAL LOW (ref 39.0–52.0)
MCH: 30.6 pg (ref 26.0–34.0)
MCV: 90.6 fL (ref 78.0–100.0)
Platelets: 296 10*3/uL (ref 150–400)
RBC: 4.05 MIL/uL — ABNORMAL LOW (ref 4.22–5.81)

## 2013-03-04 LAB — POCT GLYCOSYLATED HEMOGLOBIN (HGB A1C): Hemoglobin A1C: 10.2

## 2013-03-04 MED ORDER — CYCLOBENZAPRINE HCL 10 MG PO TABS: 10.0000 mg | ORAL_TABLET | Freq: Three times a day (TID) | ORAL | Status: DC | PRN

## 2013-03-04 MED ORDER — HYDROCODONE-ACETAMINOPHEN 10-325 MG PO TABS: 1.0000 | ORAL_TABLET | Freq: Three times a day (TID) | ORAL | Status: DC | PRN

## 2013-03-04 NOTE — Patient Instructions (Addendum)
Make appointment with Dr. Raymondo Band in next 2-3 weeks, bring your meter with you. See Dr. Raymondo Band for tobacco cessation. Think about medications. Call 1-800 [facility] for [place].  Make an appointment in 2 months for check up.     Smoking Cessation, Tips for Success YOU CAN QUIT SMOKING If you are ready to quit smoking, congratulations! You have chosen to help yourself be healthier. Cigarettes bring nicotine, tar, carbon monoxide, and other irritants into your body. Your lungs, heart, and blood vessels will be able to work better without these poisons. There are many different ways to quit smoking. Nicotine gum, nicotine patches, a nicotine inhaler, or nicotine nasal spray can help with physical craving. Hypnosis, support groups, and medicines help break the habit of smoking. Here are some tips to help you quit for good.  Throw away all cigarettes.  Clean and remove all ashtrays from your home, work, and car.  On a card, write down your reasons for quitting. Carry the card with you and read it when you get the urge to smoke.  Cleanse your body of nicotine. Drink enough water and fluids to keep your urine clear or pale yellow. Do this after quitting to flush the nicotine from your body.  Learn to predict your moods. Do not let a bad situation be your excuse to have a cigarette. Some situations in your life might tempt you into wanting a cigarette.  Never have "just one" cigarette. It leads to wanting another and another. Remind yourself of your decision to quit.  Change habits associated with smoking. If you smoked while driving or when feeling stressed, try other activities to replace smoking. Stand up when drinking your coffee. Brush your teeth after eating. Sit in a different chair when you read the paper. Avoid alcohol while trying to quit, and try to drink fewer caffeinated beverages. Alcohol and caffeine may urge you to smoke.  Avoid foods and drinks that can trigger a desire to smoke, such as  sugary or spicy foods and alcohol.  Ask people who smoke not to smoke around you.  Have something planned to do right after eating or having a cup of coffee. Take a walk or exercise to perk you up. This will help to keep you from overeating.  Try a relaxation exercise to calm you down and decrease your stress. Remember, you may be tense and nervous for the first 2 weeks after you quit, but this will pass.  Find new activities to keep your hands busy. Play with a pen, coin, or rubber band. Doodle or draw things on paper.  Brush your teeth right after eating. This will help cut down on the craving for the taste of tobacco after meals. You can try mouthwash, too.  Use oral substitutes, such as lemon drops, carrots, a cinnamon stick, or chewing gum, in place of cigarettes. Keep them handy so they are available when you have the urge to smoke.  When you have the urge to smoke, try deep breathing.  Designate your home as a nonsmoking area.  If you are a heavy smoker, ask your caregiver about a prescription for nicotine chewing gum. It can ease your withdrawal from nicotine.  Reward yourself. Set aside the cigarette money you save and buy yourself something nice.  Look for support from others. Join a support group or smoking cessation program. Ask someone at home or at work to help you with your plan to quit smoking.  Always ask yourself, "Do I need this cigarette  or is this just a reflex?" Tell yourself, "Today, I choose not to smoke," or "I do not want to smoke." You are reminding yourself of your decision to quit, even if you do smoke a cigarette. HOW WILL I FEEL WHEN I QUIT SMOKING?  The benefits of not smoking start within days of quitting.  You may have symptoms of withdrawal because your body is used to nicotine (the addictive substance in cigarettes). You may crave cigarettes, be irritable, feel very hungry, cough often, get headaches, or have difficulty concentrating.  The withdrawal  symptoms are only temporary. They are strongest when you first quit but will go away within 10 to 14 days.  When withdrawal symptoms occur, stay in control. Think about your reasons for quitting. Remind yourself that these are signs that your body is healing and getting used to being without cigarettes.  Remember that withdrawal symptoms are easier to treat than the major diseases that smoking can cause.  Even after the withdrawal is over, expect periodic urges to smoke. However, these cravings are generally short-lived and will go away whether you smoke or not. Do not smoke!  If you relapse and smoke again, do not lose hope. Most smokers quit 3 times before they are successful.  If you relapse, do not give up! Plan ahead and think about what you will do the next time you get the urge to smoke. LIFE AS A NONSMOKER: MAKE IT FOR A MONTH, MAKE IT FOR LIFE Day 1: Hang this page where you will see it every day. Day 2: Get rid of all ashtrays, matches, and lighters. Day 3: Drink water. Breathe deeply between sips. Day 4: Avoid places with smoke-filled air, such as bars, clubs, or the smoking section of restaurants. Day 5: Keep track of how much money you save by not smoking. Day 6: Avoid boredom. Keep a good book with you or go to the movies. Day 7: Reward yourself! One week without smoking! Day 8: Make a dental appointment to get your teeth cleaned. Day 9: Decide how you will turn down a cigarette before it is offered to you. Day 10: Review your reasons for quitting. Day 11: Distract yourself. Stay active to keep your mind off smoking and to relieve tension. Take a walk, exercise, read a book, do a crossword puzzle, or try a new hobby. Day 12: Exercise. Get off the bus before your stop or use stairs instead of escalators. Day 13: Call on friends for support and encouragement. Day 14: Reward yourself! Two weeks without smoking! Day 15: Practice deep breathing exercises. Day 16: Bet a friend that  you can stay a nonsmoker. Day 17: Ask to sit in nonsmoking sections of restaurants. Day 18: Hang up "No Smoking" signs. Day 19: Think of yourself as a nonsmoker. Day 20: Each morning, tell yourself you will not smoke. Day 21: Reward yourself! Three weeks without smoking! Day 22: Think of smoking in negative ways. Remember how it stains your teeth, gives you bad breath, and leaves you short of breath. Day 23: Eat a nutritious breakfast. Day 24:Do not relive your days as a smoker. Day 25: Hold a pencil in your hand when talking on the telephone. Day 26: Tell all your friends you do not smoke. Day 27: Think about how much better food tastes. Day 28: Remember, one cigarette is one too many. Day 29: Take up a hobby that will keep your hands busy. Day 30: Congratulations! One month without smoking! Give yourself a big  reward. Your caregiver can direct you to community resources or hospitals for support, which may include:  Group support.  Education.  Hypnosis.  Subliminal therapy. Document Released: 08/26/2004 Document Revised: 02/20/2012 Document Reviewed: 09/14/2009 [facility] Patient Information 2013 [place], [place].

## 2013-03-04 NOTE — Progress Notes (Signed)
Patient identified concern: elevated A1c, "time to do something about it" Stage of change contemplating change Patient reported barriers stress and financial issues have pt feeling overwhelmed Patient perceived benefits recognizes he is aging & doesn't want a heart attack "I want to live" Patient's self efficacy has been successful with smoking cessation in past, believes he can stick with a plan, "6" on scale 1-10 Behavioral support system 2 grown kids, wife, feels he is loved Goals: pt will start checking glucose 2 x day and make small adjustments to diet Patient education" given diabetic nutritional information Follow up: [facility] will call pt in 2 weeks to check in.

## 2013-03-05 ENCOUNTER — Encounter: Payer: Self-pay | Admitting: Family Medicine

## 2013-03-05 DIAGNOSIS — N183 Chronic kidney disease, stage 3 (moderate): Secondary | ICD-10-CM

## 2013-03-05 DIAGNOSIS — N1832 Chronic kidney disease, stage 3b: Secondary | ICD-10-CM | POA: Insufficient documentation

## 2013-03-05 DIAGNOSIS — N1831 Chronic kidney disease, stage 3a: Secondary | ICD-10-CM | POA: Insufficient documentation

## 2013-03-05 DIAGNOSIS — M542 Cervicalgia: Secondary | ICD-10-CM | POA: Insufficient documentation

## 2013-03-05 NOTE — Assessment & Plan Note (Signed)
Poorly controlled but stable. Barriers are identified, [facility] coach spoke with patient. He has failed to improve with my suggestions, so will refer to pharmacy clinic. He agrees to follow up in 2-4 weeks with Dr. Raymondo Band. Advised him to bring his meter (he states it stores CBGs) with checking at least 2 times daily.

## 2013-03-05 NOTE — Progress Notes (Signed)
  Subjective:    Patient ID: Colin Keller, male    DOB: [birth year], 57 y.o.   MRN: [id number]  HPI  1. DM2. Patient again certain that control is not improved. His A1c is stable at 10.3 today. He does not check CBG, though has a meter. He sometimes wakes up at night with feeling bad, jittery and eats snack without checking CBG. Thinks he has hypoglycemia during these times. States is taking NPH at 27 in am, 15 in pm.  Denies polyuria.   2. HTN. Now restarted the lisinopril/HCTZ and on metoprolol 25 BID. Does not check at home. No recent lightheadedness, syncope, palpitations, chest pain, edema, urinary changes.   3. Chronic back pain. Neck pain. Shoulder pain. Requests med refills. Has not followed up at [facility] for frozen shoulder, though he plans to. Wishes to discuss his right neck pain today, bothering him more recently, worse with turning head to right. States he was lifting a deer up onto rack and felt a cracking sound in the neck area. Since then has some stiffness with turning. Using his chronic norco. Pain radiates to upper right shoulder, not down the arm. Denies any arm weakness, numbness, tingling.   Review of Systems See HPI otherwise negative.  reports that he has been smoking.  He has never used smokeless tobacco.     Objective:   Physical Exam  Vitals reviewed. Constitutional: He is oriented to person, place, and time. He appears well-developed and well-nourished. No distress.  HENT:  Head: Normocephalic and atraumatic.  Mouth/Throat: Oropharynx is clear and moist.  Neck:  Some reduced right rotation otherwise normal ROM.  Slight TTP in right paraspinal musculature. Negative spurlings.  5/5 grip strength and arm sensation intact.   Cardiovascular: Normal rate and regular rhythm.   No murmur heard. Pulmonary/Chest: Effort normal and breath sounds normal. No respiratory distress. He has no wheezes. He has no rales.  Neurological: He is alert and oriented to person, place, and  time.  Skin: No rash noted. He is not diaphoretic.          Assessment & Plan:

## 2013-03-05 NOTE — Assessment & Plan Note (Signed)
Likely a muscle strain from lifting deer awkwardly. Will treat conservatively. No indication for imaging, will avoid now since he is self-pay. Treat with flexeril and refilled his chronic pain meds (not early). Follow up if not improving or worsens, or in 2 months.

## 2013-03-05 NOTE — Assessment & Plan Note (Signed)
Improved today, borderline low. Compliance issues make it difficult to adjust medications. Will not make changes today. Check labs. F/u in 2 months.

## 2013-03-18 ENCOUNTER — Telehealth: Payer: Self-pay | Admitting: *Deleted

## 2013-03-18 NOTE — Telephone Encounter (Signed)
Call made to f/u DM management and smoking cessation. LM for pt to call health coach.

## 2013-04-01 ENCOUNTER — Encounter: Payer: Self-pay | Admitting: *Deleted

## 2013-04-29 ENCOUNTER — Other Ambulatory Visit: Payer: Self-pay | Admitting: *Deleted

## 2013-04-30 MED ORDER — METOPROLOL TARTRATE 50 MG PO TABS: 50.0000 mg | ORAL_TABLET | Freq: Two times a day (BID) | ORAL | Status: DC

## 2013-05-01 ENCOUNTER — Encounter: Payer: Self-pay | Admitting: Family Medicine

## 2013-05-01 ENCOUNTER — Ambulatory Visit (INDEPENDENT_AMBULATORY_CARE_PROVIDER_SITE_OTHER): Payer: Self-pay | Admitting: Family Medicine

## 2013-05-01 VITALS — BP 140/70 | HR 75 | Temp 98.1°F | Ht 72.0 in | Wt 173.0 lb

## 2013-05-01 DIAGNOSIS — I10 Essential (primary) hypertension: Secondary | ICD-10-CM

## 2013-05-01 DIAGNOSIS — E1159 Type 2 diabetes mellitus with other circulatory complications: Secondary | ICD-10-CM

## 2013-05-01 DIAGNOSIS — M545 Low back pain: Secondary | ICD-10-CM

## 2013-05-01 DIAGNOSIS — Z1211 Encounter for screening for malignant neoplasm of colon: Secondary | ICD-10-CM

## 2013-05-01 DIAGNOSIS — F17200 Nicotine dependence, unspecified, uncomplicated: Secondary | ICD-10-CM

## 2013-05-01 DIAGNOSIS — E785 Hyperlipidemia, unspecified: Secondary | ICD-10-CM

## 2013-05-01 DIAGNOSIS — E1165 Type 2 diabetes mellitus with hyperglycemia: Secondary | ICD-10-CM

## 2013-05-01 LAB — BASIC METABOLIC PANEL
BUN: 31 mg/dL — ABNORMAL HIGH (ref 6–23)
Calcium: 10.1 mg/dL (ref 8.4–10.5)
Chloride: 100 mEq/L (ref 96–112)
Creat: 1.21 mg/dL (ref 0.50–1.35)

## 2013-05-01 MED ORDER — HYDROCODONE-ACETAMINOPHEN 10-325 MG PO TABS: 1.0000 | ORAL_TABLET | Freq: Three times a day (TID) | ORAL | Status: DC | PRN

## 2013-05-01 MED ORDER — INSULIN NPH ISOPHANE & REGULAR (70-30) 100 UNIT/ML ~~LOC~~ SUSP: 30.0000 [IU] | Freq: Two times a day (BID) | SUBCUTANEOUS | Status: DC

## 2013-05-01 NOTE — Assessment & Plan Note (Signed)
Hyperglycememia, especially fasting most likely due to his night sweets. He is somewhat resistant to insulin changes, which he seems to dose himself according to how much he eats. Discussed increase night insulin dose by one unit. We will repeat his creatinine today, may need to DC metformin if remains above 1.5, which was new development last month.  Gave info regarding retinal scan at [facility], he will consider doing this when he gets the money.

## 2013-05-01 NOTE — Assessment & Plan Note (Signed)
meds refilled to last until next visit in 2 months (July/august).

## 2013-05-01 NOTE — Assessment & Plan Note (Signed)
Recheck creatinine that was elevated last month. Continue ACEi. May need to DC metformin is remains elevated.

## 2013-05-01 NOTE — Assessment & Plan Note (Signed)
Improved control. No med changes. F/u in 2 months.

## 2013-05-01 NOTE — Progress Notes (Signed)
  Subjective:    Patient ID: Colin Keller, male    DOB: 04-May-1956, 57 y.o.   MRN: [id number]  HPI  1. DM2. Brings log today for the first time. He seems to check BS about twice weekly. Fasting ranges are 277-363. He admits eating sweets at night for snacks. Had one low BS at 62 about 5 pm one day in the past month. Thinks he had skipped a meal. Most values recorded range 170-360.  He knows needs an eye exam, but cannot afford this.    2. HLD. Again discussed recommendation of starting statin. He is concerned about side effects on diabetes. He is not interested in this, so we decide not to check lipids today. No recent chest pain, leg pains, syncope, dyspnea.   3. Tobacco abuse. He is cutting back, down to 12 cigarettes daily. Chewing gum as a substitute.   4. Chronic pain. Is functioning well in daily activities. Has been able to hunt turkeys and do some side work. No recent injuries. Still with back and shoulder pain. No numbness, worsened pain, weakness, falls, weight loss.  Review of Systems See HPI otherwise negative.  reports that he has been smoking.  He has never used smokeless tobacco.     Objective:   Physical Exam  Vitals reviewed. Constitutional: He is oriented to person, place, and time. He appears well-developed and well-nourished. No distress.  HENT:  Head: Normocephalic and atraumatic.  Eyes: EOM are normal.  Pulmonary/Chest: Effort normal.  Neurological: He is alert and oriented to person, place, and time.  Skin: He is not diaphoretic.  Psychiatric: He has a normal mood and affect.        Assessment & Plan:

## 2013-05-01 NOTE — Assessment & Plan Note (Signed)
Patient states he turned in cards one day after collecting sample. Negative x 3. Will repeat in one year (Due 12/14).

## 2013-05-01 NOTE — Patient Instructions (Signed)
Great job on cutting back on smoking. Increase night insulin to 18 units.  Think about starting a cholesterol medication in the future.  Have your stool cards repeated next December.  If kidney function still elevated, we may need to stop metformin. I will call you with results.  Make an appointment in 2 months for check up.

## 2013-05-01 NOTE — Assessment & Plan Note (Signed)
Again recommended statin. Will not check fasting lipids today since he is not interested in medication treatment.

## 2013-05-01 NOTE — Assessment & Plan Note (Signed)
Some improvement in cigarette use, decreased number. Encouraged his progress, and discussed his improvement potentially decreasing the risk for CAD, PAD and stroke. F/u in 2 months.

## 2013-05-02 ENCOUNTER — Telehealth: Payer: Self-pay | Admitting: Family Medicine

## 2013-05-02 NOTE — Telephone Encounter (Signed)
Please advise patient his labs show improved renal function. Keep taking the metformin for now.

## 2013-05-02 NOTE — Telephone Encounter (Signed)
Pt's wife notified regarding note below.  Mosiah Bastin, Darlyne Russian, CMA

## 2013-05-07 ENCOUNTER — Telehealth: Payer: Self-pay | Admitting: Family Medicine

## 2013-05-07 NOTE — Telephone Encounter (Signed)
Early refills are against clinic policy. We have made exceptions in the past (reviewed prior phone notes), will no longer make exceptions. Please notify patient.

## 2013-05-07 NOTE — Telephone Encounter (Signed)
Pt notified.  See note below.  Dinia Joynt, Darlyne Russian, CMA

## 2013-05-07 NOTE — Telephone Encounter (Signed)
Wife called for patient asking if the Hydrocodone can be refilled early because they will be going out of town for several weeks and need to get the meds refilled at their pharmacy before they leave.

## 2013-05-07 NOTE — Telephone Encounter (Signed)
Will fwd to Md.  Norinne Jeane L, CMA  

## 2013-05-08 ENCOUNTER — Telehealth: Payer: Self-pay | Admitting: *Deleted

## 2013-05-08 ENCOUNTER — Other Ambulatory Visit: Payer: Self-pay | Admitting: Family Medicine

## 2013-05-08 DIAGNOSIS — M545 Low back pain: Secondary | ICD-10-CM

## 2013-05-08 MED ORDER — HYDROCODONE-ACETAMINOPHEN 10-325 MG PO TABS: 1.0000 | ORAL_TABLET | Freq: Three times a day (TID) | ORAL | Status: DC | PRN

## 2013-05-08 NOTE — Telephone Encounter (Signed)
Called to [facility]:  Norco take one tab three times daily prn severe pain, disp #90 with one refill per Dr. Sherran Needs orders.  Telisha Zawadzki, Darlyne Russian, CMA

## 2013-05-08 NOTE — Telephone Encounter (Signed)
Thank you. His wife brought in the previous printed rx I gave at last visit with the dispense date of June 3rd. 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 I have disposed of the previous paper rx.

## 2013-06-07 ENCOUNTER — Telehealth: Payer: Self-pay | Admitting: *Deleted

## 2013-06-07 NOTE — Telephone Encounter (Signed)
Message copied by Radene Ou on Fri Jun 07, 2013  4:04 PM ------      Message from: Durwin Reges      Created: Fri Jun 07, 2013  1:08 PM      Regarding: pick up records       Please notify pt he has a CD disc with xray on it to pick up, for his records (he left this with [place] several months ago).  ------

## 2013-06-07 NOTE — Telephone Encounter (Signed)
Pt notified to pick up.  Britaney Espaillat, Darlyne Russian, CMA

## 2013-06-20 ENCOUNTER — Other Ambulatory Visit: Payer: Self-pay

## 2013-06-26 ENCOUNTER — Telehealth: Payer: Self-pay | Admitting: Family Medicine

## 2013-06-26 NOTE — Telephone Encounter (Signed)
Lawson Fiscal called to state that her husband Alyn had a CT scan done at [facility] on 7/10 and also a xray done by Dr. Salvatore Decent Urologist on 7/15. Kimo has a appointment schedule on Monday 7/21 with Dr. Clinton Sawyer for a kidney stone She would like [place] to request the scans from both places before coming here on Monday 7/21. Dr. Salvatore Decent  # [phone]. Lawson Fiscal stated that they did sign ROI forms at both places. JW

## 2013-07-01 ENCOUNTER — Telehealth: Payer: Self-pay | Admitting: *Deleted

## 2013-07-01 ENCOUNTER — Ambulatory Visit (INDEPENDENT_AMBULATORY_CARE_PROVIDER_SITE_OTHER): Payer: Self-pay | Admitting: Family Medicine

## 2013-07-01 ENCOUNTER — Encounter: Payer: Self-pay | Admitting: Family Medicine

## 2013-07-01 VITALS — BP 148/77 | HR 73 | Ht 72.0 in | Wt 165.1 lb

## 2013-07-01 DIAGNOSIS — E875 Hyperkalemia: Secondary | ICD-10-CM

## 2013-07-01 DIAGNOSIS — M545 Low back pain, unspecified: Secondary | ICD-10-CM

## 2013-07-01 DIAGNOSIS — R911 Solitary pulmonary nodule: Secondary | ICD-10-CM | POA: Insufficient documentation

## 2013-07-01 DIAGNOSIS — O1002 Pre-existing essential hypertension complicating childbirth: Secondary | ICD-10-CM

## 2013-07-01 DIAGNOSIS — R918 Other nonspecific abnormal finding of lung field: Secondary | ICD-10-CM | POA: Insufficient documentation

## 2013-07-01 DIAGNOSIS — N201 Calculus of ureter: Secondary | ICD-10-CM

## 2013-07-01 MED ORDER — HYDROCODONE-ACETAMINOPHEN 10-325 MG PO TABS: 1.0000 | ORAL_TABLET | Freq: Three times a day (TID) | ORAL | Status: DC | PRN

## 2013-07-01 MED ORDER — METOPROLOL TARTRATE 50 MG PO TABS: 25.0000 mg | ORAL_TABLET | Freq: Two times a day (BID) | ORAL | Status: DC

## 2013-07-01 NOTE — Telephone Encounter (Signed)
[facility] and spoke with supervisor of Medical records 'Unknown Jim'. Wrong records Tres Grzywacz DOB [id number] were given to this pt. We needs records on pt. Pam will mail the records to Attn: Dr.Williamson. I faxed the wrong records to her Attn: # [id number] Fwd. To Dr.Williamson and also PCP for info. Lorenda Hatchet, Renato Battles

## 2013-07-01 NOTE — Telephone Encounter (Signed)
I called patient an informed him that we obtained the correct records from [facility] and that his CT scan mentions only that "lung bases are clear" and does not make any other comment on the lungs. I also re-affirmed the finding of a 4 mm stone on the distal right ureter. He understood and has no other questions.

## 2013-07-01 NOTE — Assessment & Plan Note (Signed)
Clinically appears resolved. I suggested taking flomax as needed if pain returns. Patient declined any other treatment at this time and would like to wait until he consults with urologist. I also instructed him to talk with the urologist about his urinary difficulty. He agreed to this plan.

## 2013-07-01 NOTE — Assessment & Plan Note (Signed)
We did not get a chance to discuss chronic pain. He needs a refill on Norco 10-325, so I gave him 90 tabs with 0 refills so he can follow up to discuss.

## 2013-07-01 NOTE — Progress Notes (Signed)
Subjective:    Patient ID: Colin Keller, male    DOB: [birth year], 57 y.o.   MRN: [id number]  HPI  57 year old M who presents for follow up of renal stone.   Kidney Stone - Right Sided, presented about 10 days ago and went to [facility]; imaging showed it was in the ureter; he was given Rx for pain medication and used a strainer since that time but has not noted any stones in his urine; His pain is markedly improved, has follow up appointment with urologist tomorrow  CT Scan - Patient presents for [facility] with concerns about a CT scan report that reads bilateral sub 1 cm nodules and recommends repeat CT in 3 mos. HOWEVER, THE REPORT IS FOR A DIFFERENT Deren Cisar AND WAS GIVEN TO MY PATIENT IN ERROR; after calling [facility] and obtaining correct records, there is no concern for pulmonary nodules noted  Patient Active Problem List   Diagnosis Date Noted  . Neck pain on right side 03/05/2013  . CKD (chronic kidney disease) stage 3, GFR 30-59 ml/min 03/05/2013  . DM (diabetes mellitus), type 2, uncontrolled 10/24/2012  . Colon cancer screening 10/24/2012  . AC joint dislocation 07/26/2012  . Frozen shoulder 06/13/2012  . Shoulder pain 06/13/2012  . Visual field scotoma 12/31/2011  . NICOTINE ADDICTION 07/29/2009  . HYPERTENSION, BENIGN 12/22/2008  . HYPERLIPIDEMIA 12/21/2008  . BACK PAIN, LUMBAR 10/16/2008  . 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 Diabetes mellitus   . GERD (gastroesophageal reflux disease)   . Hypertension   . Hyperlipidemia   . Tobacco abuse    No past surgical history on file.    Current Outpatient Prescriptions on File Prior to Visit  Medication Sig Dispense Refill  . amitriptyline (ELAVIL) 25 MG tablet Take 1 tablet (25 mg total) by mouth at bedtime.  30 tablet  3  . aspirin 81 MG tablet Take 81 mg by mouth daily.      . cyclobenzaprine (FLEXERIL) 10 MG tablet Take 1 tablet (10 mg total) by mouth  3 (three) times daily as needed for muscle spasms.  20 tablet  0  . diclofenac (VOLTAREN) 75 MG EC tablet Take 1 tablet by mouth twice daily with food prn.  60 tablet  1  . HYDROcodone-acetaminophen (NORCO) 10-325 MG per tablet Take 1 tablet by mouth 3 (three) times daily as needed for pain. For severe pain  90 tablet  1  . insulin NPH-regular (NOVOLIN 70/30 INNOLET) (70-30) 100 UNIT/ML injection Inject 30 Units into the skin 2 (two) times daily. Take 25 in the morning and 18 at night  10 mL  0  . lisinopril-hydrochlorothiazide (ZESTORETIC) 20-12.5 MG per tablet Take 1 tablet by mouth daily.  90 tablet  3  . metFORMIN (GLUCOPHAGE) 1000 MG tablet Take 1 tablet (1,000 mg total) by mouth 2 (two) times daily. For diabetes  180 tablet  3  . metoprolol (LOPRESSOR) 50 MG tablet Take 1 tablet (50 mg total) by mouth 2 (two) times daily.  60 tablet  3  . simvastatin (ZOCOR) 80 MG tablet Take 0.5 tablets (40 mg total) by mouth every evening.  90 tablet  6   No current facility-administered medications on file prior to visit.     Review of Systems  Genitourinary: Positive for difficulty urinating.  Musculoskeletal: Positive for back pain.       Objective:   Physical Exam BP 148/77  Pulse 73  Ht 6' (1.829 m)  Wt 165 lb 1.6 oz (74.889 kg)  BMI 22.39 kg/m2 Gen: well appearing middle age 57, non distressed Prostate exam: deferred by patient       Assessment & Plan:  > 15 minutes spent counseling patient and his wife about urinary tract issues and general health maintenance

## 2013-07-01 NOTE — Assessment & Plan Note (Signed)
Patient with K+ of 5.5 in May 2014 and not addressed. Also with K+ of 5.5 in hospital. This was not realized until records obtained after visit. Check K+ at next visit and consider stopping ACE-I.

## 2013-07-01 NOTE — Patient Instructions (Addendum)
Dear Colin Keller,   Thank you for coming to clinic today. Please read below regarding the issues that we discussed.   1. Kidney Stone - I imagine that you have passed it since you are not having pain. Please ask the urologist if there is any follow up that is needed. Also, please let him know that you have not had a prostate exam and you have some symptoms.   2. Right Lung Nodule - You have a few nodule in your right lower lobe that are very small. We should get a CT scan in 3 months.   3. Diabetes - Please follow up with me in the next month.   Please follow up in clinic with 1 month. Please call earlier if you have any questions or concerns.   Sincerely,   Dr. Clinton Sawyer

## 2013-07-11 ENCOUNTER — Ambulatory Visit: Payer: Self-pay | Admitting: Family Medicine

## 2013-07-15 ENCOUNTER — Telehealth: Payer: Self-pay | Admitting: *Deleted

## 2013-07-15 NOTE — Telephone Encounter (Signed)
Patient completed form requesting to change to male PCP, preferably Dr. Clinton Sawyer.  Patient has a follow-up appt with Dr. Clinton Sawyer on 07/26/13 at 4:15 pm.  Changed PCP to Dr. Clinton Sawyer.  Gaylene Brooks, RN

## 2013-07-26 ENCOUNTER — Ambulatory Visit (INDEPENDENT_AMBULATORY_CARE_PROVIDER_SITE_OTHER): Payer: Self-pay | Admitting: Family Medicine

## 2013-07-26 ENCOUNTER — Encounter: Payer: Self-pay | Admitting: Family Medicine

## 2013-07-26 VITALS — BP 120/65 | HR 66 | Temp 98.3°F | Ht 72.0 in | Wt 170.6 lb

## 2013-07-26 DIAGNOSIS — E1165 Type 2 diabetes mellitus with hyperglycemia: Secondary | ICD-10-CM

## 2013-07-26 DIAGNOSIS — M545 Low back pain: Secondary | ICD-10-CM

## 2013-07-26 DIAGNOSIS — M7501 Adhesive capsulitis of right shoulder: Secondary | ICD-10-CM

## 2013-07-26 DIAGNOSIS — M7500 Adhesive capsulitis of unspecified shoulder: Secondary | ICD-10-CM

## 2013-07-26 DIAGNOSIS — G4701 Insomnia due to medical condition: Secondary | ICD-10-CM

## 2013-07-26 MED ORDER — CYCLOBENZAPRINE HCL 10 MG PO TABS: 10.0000 mg | ORAL_TABLET | Freq: Three times a day (TID) | ORAL | Status: DC | PRN

## 2013-07-26 MED ORDER — HYDROCODONE-ACETAMINOPHEN 10-325 MG PO TABS: 1.0000 | ORAL_TABLET | Freq: Three times a day (TID) | ORAL | Status: DC | PRN

## 2013-07-26 NOTE — Progress Notes (Signed)
  Subjective:    Patient ID: Colin Keller, male    DOB: August 06, 1956, 57 y.o.   MRN: [id number]  HPI  57 year old M with poorly controlled DM Type 2 and chronic lower back pain and shoulder pain.  Sleeping Difficulty: Wakes nightly between 2-3 AM each night due to right shoulder pain. He has no difficulty falling asleep. This happens 3-4 times per week. He has a documented history of adhesive capsulitis for which he received a steroid injection one time in 2013. He notes that he also used a muscle relaxer at night, which helped his shoulder pain and his sleep. No history of using other sleep aid. He is not currently doing any exercises for his shoulder but did exercises to improve range of motion in the past.     Back Pain: Location: lower back Duration: several years  2003 Character: constatnt Quality: 5/10 Current Functional Status:  ADL's - does not inhibit Preceding Events: no falls. 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 Red Flags: no weakness, no numbness and tingling, no impaired bowel or bladder function   Review of Systems See HPI    Objective:   Physical Exam BP 120/65  Pulse 66  Temp(Src) 98.3 F (36.8 C) (Oral)  Ht 6' (1.829 m)  Wt 170 lb 9.6 oz (77.384 kg)  BMI 23.13 kg/m2  Gen: middle age WM, appears stated age, non ill, pleasant and conversant Shoulder Exam - right Inspection:  Palpation:   Clavicle:    AC Joint:   Scapula:             Biceps Tendon: ROM/Strength:  Abduction (Suprapsinatus): limited  Internal Rotation/Liftoff (Subsapularis):limited, severely  External Rotation (Infraspinatus/Teres Minor):  normal Maneuvers:   Neer's: limited  Hawkin's:limited  Empty Can: normal     Back - No spinous process tenderness, 5/5 strength of lower extremities bilaterally, negative straight leg raise test, 2+ patellar and 1+ achilles DTR      Assessment & Plan:

## 2013-07-26 NOTE — Patient Instructions (Addendum)
Please come back in 2-3 weeks to discuss the diabetes. Your A1C today was 9.5. We will get this better and consider medication changes if needed.   For your shoulder pain, take the flexeril as needed at night. Continue the Norco on days when you have pain.   Sincerely,   Dr. Clinton Sawyer

## 2013-07-30 DIAGNOSIS — G4701 Insomnia due to medical condition: Secondary | ICD-10-CM | POA: Insufficient documentation

## 2013-07-30 NOTE — Assessment & Plan Note (Addendum)
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

## 2013-07-30 NOTE — Assessment & Plan Note (Signed)
Assessment: Early awakening due to pain in rt shoulder Plan: Encouraged use of flexeril QHS PRN and other comments on adhesive capsulitis see below problem

## 2013-07-30 NOTE — Assessment & Plan Note (Signed)
Assessment: stable chronic back pain  Plan: continue Norco 10-325, give handwritten prescription for # 90 with 0 refills b/c scripts wouldn't print; perform UDS and ask for pain contract sign at next visit

## 2013-08-21 ENCOUNTER — Other Ambulatory Visit: Payer: Self-pay | Admitting: Family Medicine

## 2013-08-21 DIAGNOSIS — I10 Essential (primary) hypertension: Secondary | ICD-10-CM

## 2013-08-21 MED ORDER — LISINOPRIL-HYDROCHLOROTHIAZIDE 20-12.5 MG PO TABS: 1.0000 | ORAL_TABLET | Freq: Every day | ORAL | Status: DC

## 2013-08-23 ENCOUNTER — Encounter: Payer: Self-pay | Admitting: Family Medicine

## 2013-08-23 ENCOUNTER — Ambulatory Visit (INDEPENDENT_AMBULATORY_CARE_PROVIDER_SITE_OTHER): Payer: Self-pay | Admitting: Family Medicine

## 2013-08-23 VITALS — BP 122/69 | HR 60 | Ht 72.0 in | Wt 168.0 lb

## 2013-08-23 DIAGNOSIS — E119 Type 2 diabetes mellitus without complications: Secondary | ICD-10-CM

## 2013-08-23 DIAGNOSIS — E1165 Type 2 diabetes mellitus with hyperglycemia: Secondary | ICD-10-CM

## 2013-08-23 MED ORDER — INSULIN NPH (HUMAN) (ISOPHANE) 100 UNIT/ML ~~LOC~~ SUSP: 15.0000 [IU] | Freq: Two times a day (BID) | SUBCUTANEOUS | Status: DC

## 2013-08-23 MED ORDER — INSULIN REGULAR HUMAN 100 UNIT/ML IJ SOLN: INTRAMUSCULAR | Status: DC

## 2013-08-23 NOTE — Progress Notes (Signed)
Patient ID: Colin Keller, male   DOB: 1956-05-21, 57 y.o.   MRN: [id number]  Subjective:   Diabetes  Recent Issues: has had difficulty keeping blood sugars up in the morning, 3 hours after taking his morning insulin he starts to have symptoms of hypoglycemia, however does not have any blood sugars recorded at this time  Medication Compliance:  Diabetes medication: Yes - Insulin 70/30 25 in AM and 15 in PM, Metformin 1000 mg BID  Taking ACE-I: Yes - lisinopril   Taking statin: No - Took Zocor and couldn't tolerate it due to arthralgia   Behavioral:  Home CBG Monitoring: Yes - intermittently, some over 200 and one about 90  Diet changes: No - has eaten honey buns in afternoon and biscuit in the morning, patient recognizes this as a large barrier to improving his glycemic control   Exercise: No  Health Maintenance:  Visual problems: Yes , has some "floaters" in his left  Last eye exam: years  Last dental visit: none  Foot ulcers: No - cleans feet daily and has neuroapthy    Review of Systems:  Pertinent items are noted in HPI. Chronic right shoulder pain     Objective:   Physical Exam: BP 122/69  Pulse 60  Ht 6' (1.829 m)  Wt 168 lb (76.204 kg)  BMI 22.78 kg/m2  General: alert, well appearing, and in no distress Mouth: very poor dentition missing most teeth Eyes: fundoscopic exam consistent with diabetes retinopathy Cardiovascular: RRR, nl S1 and S2, peripheral pulses normal, no carotid bruits, no abdominal bruits Feet: warm, good capillary refill and normal monofilament exam  Labs:   Diabetic Labs:  Lab Results  Component Value Date   HGBA1C 9.5 07/26/2013   HGBA1C 10.2 03/04/2013   HGBA1C 10.5 10/22/2012   Lab Results  Component Value Date   LDLCALC 106* 11/29/2010   CREATININE 1.21 05/01/2013   Last microalbumin: No results found for this basename: MICROALBUR, [id number]        Assessment & Plan:

## 2013-08-23 NOTE — Patient Instructions (Addendum)
I think we should work on the diabetes regimen to get your covered a little better.   For the insulin: 1. Take Insulin NPH (intermediate acting) 15 units in the morning and 15 units at night 2. Take the Insulin R (regular short acting) 5 units at lunch and 7 units with dinner.  Please check your blood sugar 3-4 times a day and bring back the log.   Also, continue the Metformin.   Follow up in 2 weeks.   Sincerely,   Dr. Clinton Sawyer

## 2013-08-23 NOTE — Assessment & Plan Note (Signed)
Assessment: poorly controlled type 2 diabetes, barriers to improvement include inconsistency of diet and needing low glycemic index foods at the appropriate times as well as minimal variability of insulin dosing based on inconsistent blood sugar checks; at this time the patient is motivated to improve his diabetes and willing to adjust his insulin regimen Plan:  - ophthalmology exam to be performed today - For the medication: 1. Take Insulin NPH (intermediate acting) 15 units in the morning and 15 units at night 2. Take the Insulin R (regular short acting) 5 units at lunch and 7 units with dinner. 3. Continue metformin 4. Please check your blood sugar 3-4 times a day and bring back the log.    Follow up in 2 weeks.   Patient will need a pneumonia vaccine and flu vaccine at next visit

## 2013-08-26 ENCOUNTER — Telehealth: Payer: Self-pay | Admitting: Family Medicine

## 2013-08-26 ENCOUNTER — Encounter: Payer: Self-pay | Admitting: Home Health Services

## 2013-08-26 DIAGNOSIS — E113599 Type 2 diabetes mellitus with proliferative diabetic retinopathy without macular edema, unspecified eye: Secondary | ICD-10-CM | POA: Insufficient documentation

## 2013-08-26 DIAGNOSIS — E11359 Type 2 diabetes mellitus with proliferative diabetic retinopathy without macular edema: Secondary | ICD-10-CM | POA: Insufficient documentation

## 2013-08-26 NOTE — Telephone Encounter (Signed)
Will fwd to MD.  Colin Keller, CMA  

## 2013-08-26 NOTE — Telephone Encounter (Signed)
LMVM for patient to call office.  Please schedule OV when pt calls.  Thank you.  Bianca Raneri, Darlyne Russian, CMA

## 2013-08-26 NOTE — Telephone Encounter (Signed)
Will fwd to MD.  Trent Gabler L, CMA  

## 2013-08-26 NOTE — Telephone Encounter (Signed)
Pt is requesting a refill on his hydrocodone. JW

## 2013-08-26 NOTE — Telephone Encounter (Signed)
Please ask patient to come to clinic for a visit. All pain medications are refilled at clinic visits.

## 2013-08-26 NOTE — Telephone Encounter (Signed)
Pt called back and doesn't understand why he can not have a refill without an OV. He was just her on Friday 9/12 so he just doesn't understand why he would have to come back. JW

## 2013-08-27 NOTE — Telephone Encounter (Signed)
All controlled substances like pain medications are only prescribed at visits that are designated to assess the patient's pain. At his previous visit we addressed his diabetes, therefore, he needs a separate visit to address his pain issues. Please tell the patient that I appreciate his understanding.

## 2013-08-30 ENCOUNTER — Ambulatory Visit (INDEPENDENT_AMBULATORY_CARE_PROVIDER_SITE_OTHER): Payer: Self-pay | Admitting: Family Medicine

## 2013-08-30 ENCOUNTER — Encounter: Payer: Self-pay | Admitting: Family Medicine

## 2013-08-30 ENCOUNTER — Telehealth: Payer: Self-pay | Admitting: Family Medicine

## 2013-08-30 VITALS — BP 129/65 | HR 65 | Ht 72.0 in | Wt 170.0 lb

## 2013-08-30 DIAGNOSIS — F1190 Opioid use, unspecified, uncomplicated: Secondary | ICD-10-CM

## 2013-08-30 DIAGNOSIS — M545 Low back pain: Secondary | ICD-10-CM

## 2013-08-30 DIAGNOSIS — E1139 Type 2 diabetes mellitus with other diabetic ophthalmic complication: Secondary | ICD-10-CM

## 2013-08-30 DIAGNOSIS — E11319 Type 2 diabetes mellitus with unspecified diabetic retinopathy without macular edema: Secondary | ICD-10-CM | POA: Insufficient documentation

## 2013-08-30 DIAGNOSIS — F1120 Opioid dependence, uncomplicated: Secondary | ICD-10-CM

## 2013-08-30 MED ORDER — HYDROCODONE-ACETAMINOPHEN 10-325 MG PO TABS: 1.0000 | ORAL_TABLET | Freq: Three times a day (TID) | ORAL | Status: DC | PRN

## 2013-08-30 NOTE — Progress Notes (Signed)
  Subjective:    Patient ID: Colin Keller, male    DOB: [birth year], 57 y.o.   MRN: [id number]  HPI  Chronic pain follow up. Last filled Norco 10-325 mg #90 on 07/29/13.   Back Pain:  Location: lower back  Duration: several years 2003  Preceding Events: no falls. 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   Red Flags: no weakness, no numbness and tingling, no impaired bowel or bladder function    Review of Systems See HPI    Objective:   Physical Exam  BP 129/65  Pulse 65  Ht 6' (1.829 m)  Wt 170 lb (77.111 kg)  BMI 23.05 kg/m2 Gen: middle aged WM, well appearing, non distressed Back: no scoliosis, mild TTP of left PSIS, no TTP of spinous processes Neuro: 5/5 strength LE bilaterally, negative straight leg raise test bilaterally, sensation intact to light touch, DTR 1+ achilles, patella     Assessment & Plan:

## 2013-08-30 NOTE — Telephone Encounter (Signed)
Lawson Fiscal called for Dr. Misty Stanley to make him aware that the medication he prescribed Humulin R and Humulin N needs to prescribed as generic so that they can afford the medication. Lawson Fiscal talked to the pharmacist and he told her that they needed the doctor to say generic on the prescription. JW

## 2013-08-30 NOTE — Patient Instructions (Addendum)
Dear Mr. Kun,   It was nice to see you today. I appreciate you coming in so we could take care of some of the formal things that I have to do when prescribing chronic pain medications. These prescriptions should last about 3 months, so we can refill them in December as needed. Please read below about my rules for proper handling and care of these medications.   For your diabetes, please follow up in 1-2 weeks.   Sincerely,   Dr. Clinton Sawyer  Narcotic Guidelines:   1. You cannot get an early refill, even it is lost.   2. You cannot get pain medications from any other doctor, unless it is the emergency department and related to a new problem or injury.  3. You cannot use alcohol, marijuana, cocaine or any other recreational drugs while using this medication. This is very dangerous.  4. You are willing to have your urine drug tested at each visit.  5. You will not drive while using this medication, because that can endanger you and your family.  6. If any medication is stolen, then there must be a police report to verify it, or it cannot be refilled.  7. I will not prescribe these medications for longer than 3 months.  8. You must bring your pill bottle to each visit.  9. You must use the same pharmacy for all refills for the medication, unless you clear it with me beforehand. 10. You cannot share or sell this medication.  11. Always take a stool softener to prevent constipation.

## 2013-08-31 LAB — DRUG SCREEN, URINE
Creatinine,U: 66.69 mg/dL
Opiates: POSITIVE — AB
Phencyclidine (PCP): NEGATIVE
Propoxyphene: NEGATIVE

## 2013-08-31 NOTE — Assessment & Plan Note (Signed)
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     

## 2013-09-02 NOTE — Telephone Encounter (Signed)
Spoke with patient's wife and everything has been taken care of.  Domenic Schoenberger, Darlyne Russian, CMA

## 2013-09-03 ENCOUNTER — Telehealth: Payer: Self-pay | Admitting: Family Medicine

## 2013-09-03 DIAGNOSIS — IMO0002 Reserved for concepts with insufficient information to code with codable children: Secondary | ICD-10-CM

## 2013-09-03 DIAGNOSIS — E1165 Type 2 diabetes mellitus with hyperglycemia: Secondary | ICD-10-CM

## 2013-09-03 MED ORDER — INSULIN REGULAR HUMAN 100 UNIT/ML IJ SOLN: INTRAMUSCULAR | Status: DC

## 2013-09-03 MED ORDER — INSULIN NPH (HUMAN) (ISOPHANE) 100 UNIT/ML ~~LOC~~ SUSP: 15.0000 [IU] | Freq: Two times a day (BID) | SUBCUTANEOUS | Status: DC

## 2013-09-03 NOTE — Telephone Encounter (Signed)
[name] 22 him and I sent prescriptions for generic insulin to [facility] and [place]. She will pick this up and begin using it next Monday. I also spoke with him about the results of his urine drug screen were positive for benzodiazepines. The patient explained to me that he occasionally takes diazepam in order to sleep if he is in severe pain for his right shoulder or his back. The medications prescribed to his wife. He notes that he takes it "once in a blue moon". I explained to him that it is not safe for him to take pain medication I am prescribing him and benzodiazepines. I noted that he is required to obtain controlled substances from me and cannot take any from other physicians or other patients. He is in agreement with this plan and will not use his wife's medication anymore. Furthermore,I directed him to take 5 mg of Flexeril as needed for sleep.  He was agreeable with this plan. He is willing to take another urine drug screen in the future to ensure that he is not using this medication anymore.

## 2013-09-06 ENCOUNTER — Ambulatory Visit: Payer: Self-pay | Admitting: Family Medicine

## 2013-09-10 ENCOUNTER — Telehealth: Payer: Self-pay | Admitting: Family Medicine

## 2013-09-10 NOTE — Telephone Encounter (Signed)
Pt has questions about when dr Clinton Sawyer wants him to test his blood sugars Please advise

## 2013-09-10 NOTE — Telephone Encounter (Signed)
Pt not sure when to check his blood sugars.  Precepted with Dr. Randolm Idol;  until I may ask Dr. Clinton Sawyer.  Instructed patient to check his blood sugars before meals and at bedtime.  Will forward to Dr. Clinton Sawyer for review.  Champagne Paletta, Darlyne Russian, CMA

## 2013-09-11 NOTE — Telephone Encounter (Signed)
I agree with Dr. Randolm Idol. Thank you.

## 2013-09-18 ENCOUNTER — Telehealth: Payer: Self-pay | Admitting: Family Medicine

## 2013-09-18 NOTE — Telephone Encounter (Signed)
Patient returns calls. Please call patient on his cell [phone]

## 2013-09-18 NOTE — Telephone Encounter (Signed)
LMVM for patient to call us back.. I was hoping to get more information regarding patients needs.  Will fwd to MD, since I was unable to reach patient.  Nehal Shives, Darlyne Russian, CMA

## 2013-09-18 NOTE — Telephone Encounter (Signed)
I spoke with patient about his insulin management headed into his surgery today. He notes that his blood sugars have been getting very low in the afternoon, down into the 40's and 50's, so I told him to back off his 5 units at lunch and move to a sliding scale of taking 0 units for < 90, 90-110 with 1 unit, 110-130 take 2 units, 130-150 = 3 units, 150-170 = 4 units, > 170 = 5 units; Also he has had low CBG in the morning so he will decrease PM dose of NPH to 10 units. Additionally, he will take on 10 units on NPH tomorrow morning prior to his 3:30 PM surgery. He acknowledged understanding.

## 2013-09-18 NOTE — Telephone Encounter (Signed)
Pt called because he is having eye surgery tomorrow and is really concerned because of the new insulin he is taking. He is having surgery at 3 pm.  He really needs to talk to Dr. Clinton Sawyer before he has this surgery. He would like to talk him tonight when he finishes clinic. If he doesn't answer the house phone please call his cell at [phone]. JW

## 2013-09-27 ENCOUNTER — Telehealth: Payer: Self-pay | Admitting: Family Medicine

## 2013-09-27 NOTE — Telephone Encounter (Addendum)
Patient concerned about blood sugar and lability. Feels like he has lots of highs and lows and feels nervous, jittery with nausea and vomiting. Would like to go back to regimen of Insulin 70/30 25 U in AM and 15U in PM. I advised this was fine. He will follow up on Wednesday.

## 2013-09-27 NOTE — Telephone Encounter (Signed)
Pt called and is still having issue's with his insulin and controlling his blood sugar levels. He would like to talk to Dr. Clinton Sawyer asap. JW

## 2013-10-02 ENCOUNTER — Encounter: Payer: Self-pay | Admitting: Family Medicine

## 2013-10-02 ENCOUNTER — Ambulatory Visit (INDEPENDENT_AMBULATORY_CARE_PROVIDER_SITE_OTHER): Payer: Self-pay | Admitting: Family Medicine

## 2013-10-02 VITALS — BP 144/76 | HR 66 | Ht 72.0 in | Wt 166.8 lb

## 2013-10-02 DIAGNOSIS — E1165 Type 2 diabetes mellitus with hyperglycemia: Secondary | ICD-10-CM

## 2013-10-02 DIAGNOSIS — Z23 Encounter for immunization: Secondary | ICD-10-CM

## 2013-10-02 NOTE — Progress Notes (Signed)
Patient ID: Colin Keller, male   DOB: [birth year], 57 y.o.   MRN: [id number]   Subjective:   Diabetes  Recent Issues:  Hypoglycemia: Yes - pt with very labile sugars so he preferred to go back to previous regimen of insulin 70/30 25u q AM and 15U q PM and stop the regimen of 15 units of NPH in the morning and night with regular insulin 5 units with lunch and 7 with dinner; today he reports improvement in his since starting the 70/30 BID; previous range while using insulin N and R was 46 to >400, but has recently been in the 100-200s range; patient brings very detailed logs of blood sugars and diet  Diabetic Retinopathy - s/p laser ablation by opthalmologist Dr. Randon Goldsmith two weeks ago, patient notes that having a "shadow" in his left eye still, plan for procedure on right eye next week  Medication Compliance: Diabetes medication: Yes - continued Metformin, see notes about insulin above  Taking ACE-I: Yes Taking statin: No  Behavioral: Home CBG Monitoring: Yes - see above Diet changes: Yes Exercise: No  Health Maintenance: Visual problems: Yes - see above Last eye exam: active treatment for proliferative diabetic retinopathy Last dental visit: unknown Foot ulcers: No   Current Outpatient Prescriptions on File Prior to Visit  Medication Sig Dispense Refill  . aspirin 81 MG tablet Take 81 mg by mouth daily.      . cyclobenzaprine (FLEXERIL) 10 MG tablet Take 1 tablet (10 mg total) by mouth 3 (three) times daily as needed for muscle spasms.  30 tablet  2  . famotidine (PEPCID) 20 MG tablet Take 20 mg by mouth 2 (two) times daily.      Marland Kitchen HYDROcodone-acetaminophen (NORCO) 10-325 MG per tablet Take 1 tablet by mouth 3 (three) times daily as needed for pain. For severe pain  90 tablet  0  . HYDROcodone-acetaminophen (NORCO) 10-325 MG per tablet Take 1 tablet by mouth every 8 (eight) hours as needed for pain.  90 tablet  0  . HYDROcodone-acetaminophen (NORCO) 10-325 MG per tablet Take 1 tablet  by mouth every 8 (eight) hours as needed for pain.  90 tablet  0  . insulin NPH (NOVOLIN N RELION) 100 UNIT/ML injection Inject 15 Units into the skin 2 (two) times daily at 8 am and 10 pm.  1 vial  12  . insulin regular (NOVOLIN R RELION) 100 units/mL injection 5 units with lunch and 7 units with dinner  10 mL  12  . lisinopril-hydrochlorothiazide (ZESTORETIC) 20-12.5 MG per tablet Take 1 tablet by mouth daily.  90 tablet  3  . metFORMIN (GLUCOPHAGE) 1000 MG tablet Take 1 tablet (1,000 mg total) by mouth 2 (two) times daily. For diabetes  180 tablet  3  . metoprolol (LOPRESSOR) 50 MG tablet Take 0.5 tablets (25 mg total) by mouth 2 (two) times daily.  90 tablet  3   No current facility-administered medications on file prior to visit.     Review of Systems:  Pertinent items are noted in HPI.     Objective:   Physical Exam: There were no vitals taken for this visit.  General: alert, well appearing, and in no distress Eyes: deferred Cardiovascular: RRR, nl S1 and S2 Pulmonary: clear to auscultation bilaterally Feet:not performed   Labs:   Diabetic Labs:  Lab Results  Component Value Date   HGBA1C 9.5 07/26/2013   HGBA1C 10.2 03/04/2013   HGBA1C 10.5 10/22/2012   Lab Results  Component Value  Date   LDLCALC 106* 11/29/2010   CREATININE 1.21 05/01/2013   Last microalbumin: No results found for this basename: MICROALBUR, [id number]        Assessment & Plan:

## 2013-10-02 NOTE — Patient Instructions (Signed)
Mr. Reaume,   I am glad that you are doing better the last few days after changing the insulin. Please continue with the 25 units in the morning 15 units of evening of 70/30.   Please keep good records and come back in 4 weeks.   Sincerely,   Dr. Clinton Sawyer

## 2013-10-02 NOTE — Assessment & Plan Note (Signed)
Assessment: Unfortunately patient with very labile blood sugars while using insulin R and insulin N separately. He seemed extremely sensitive to the short acting insulin given that his meals per report were very carbohydrate heavy Plan: it is certainly safer for the patient to switch back to insulin 70/30 to avoid hypoglycemia, so we will stick with this for now with 25 units qAM and 15 units qPM; the next step in the process of improving his diabetic control is carbohydrate counting   F/u in 4 weeks

## 2013-11-26 ENCOUNTER — Ambulatory Visit (INDEPENDENT_AMBULATORY_CARE_PROVIDER_SITE_OTHER): Payer: Self-pay | Admitting: Family Medicine

## 2013-11-26 ENCOUNTER — Encounter: Payer: Self-pay | Admitting: Family Medicine

## 2013-11-26 VITALS — BP 168/78 | HR 74 | Temp 97.7°F | Ht 72.0 in | Wt 174.6 lb

## 2013-11-26 DIAGNOSIS — E1165 Type 2 diabetes mellitus with hyperglycemia: Secondary | ICD-10-CM

## 2013-11-26 DIAGNOSIS — M545 Low back pain, unspecified: Secondary | ICD-10-CM

## 2013-11-26 DIAGNOSIS — F1190 Opioid use, unspecified, uncomplicated: Secondary | ICD-10-CM

## 2013-11-26 DIAGNOSIS — F1120 Opioid dependence, uncomplicated: Secondary | ICD-10-CM

## 2013-11-26 DIAGNOSIS — IMO0002 Reserved for concepts with insufficient information to code with codable children: Secondary | ICD-10-CM

## 2013-11-26 LAB — POCT GLYCOSYLATED HEMOGLOBIN (HGB A1C): Hemoglobin A1C: 8.8

## 2013-11-26 MED ORDER — HYDROCODONE-ACETAMINOPHEN 10-325 MG PO TABS: 1.0000 | ORAL_TABLET | Freq: Three times a day (TID) | ORAL | Status: DC | PRN

## 2013-11-26 NOTE — Patient Instructions (Signed)
[name], Clutter to see you.   1. Diabetes - Please contine with the 70/30 at 25 units in the morning and 15 units in the evening. My goal for you is to decrease the night time snacking.  2. Cholesterol - We will check this at the next visit.  3. Back Pain - Continue the medication as needed and read the narcotic rules below.  4. Smoking - I know you are stressed, but this will cause long term problems to your lungs and increase your risk of stroke.   I hope work picks up for you and that you have a [employer] Christmas!  Sincerely,   Dr. Clinton Sawyer  Narcotic Guidelines:   1. You cannot get an early refill, even it is lost.   2. You cannot get pain medications from any other doctor, unless it is the emergency department and related to a new problem or injury.  3. You cannot use alcohol, marijuana, cocaine or any other recreational drugs while using this medication. This is very dangerous.  4. You are willing to have your urine drug tested at each visit.  5. You will not drive while using this medication, because that can endanger you and your family.  6. If any medication is stolen, then there must be a police report to verify it, or it cannot be refilled.  7. I will not prescribe these medications for longer than 3 months.  8. You must bring your pill bottle to each visit.  9. You must use the same pharmacy for all refills for the medication, unless you clear it with me beforehand. 10. You cannot share or sell this medication.  11. Always take a stool softener to prevent constipation.

## 2013-11-26 NOTE — Progress Notes (Signed)
Patient ID: Colin Keller, male   DOB: [birth year], 57 y.o.   MRN: [id number]   Subjective:   Diabetes  Recent Issues:  Hypoglycemia: Yes - once to 68 At last visit patient transitioned back to insulin 70/30 25 U q AM and 15 U q PM b/c of erratic blood sugars when tried in insulin N and R separately   Medication Compliance: Diabetes medication: Yes  Taking ACE-I: Yes Taking statin: No - has hx of arthralgias to statin medication  Behavioral: Home CBG Monitoring: Yes Diet changes: YES- WORSENING DIET DUE TO STRESS, HAS INCREASED HIS NIGHTLY FOOD CONSUMPTION LEADING TO HIGH AM SUGARS Exercise: No  Health Maintenance: Visual problems: Yes - shadowing around right peripheral vision  Last eye exam: had diabetic retinopathy treatment with Dr. Randon Goldsmith in October  Last dental visit: unknown Foot ulcers: No   Back Pain:  Last pain medication refill: 08/30/13 - Norco 10-325 mg TID PRN # 90 with 2 refills   Location: lower back  Duration: several years 2003  Preceding Events: no falls. 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  Red Flags: no weakness, no numbness and tingling, no impaired bowel or bladder function  Current Outpatient Prescriptions on File Prior to Visit  Medication Sig Dispense Refill  . aspirin 81 MG tablet Take 81 mg by mouth daily.      . cyclobenzaprine (FLEXERIL) 10 MG tablet Take 1 tablet (10 mg total) by mouth 3 (three) times daily as needed for muscle spasms.  30  tablet  2  . famotidine (PEPCID) 20 MG tablet Take 20 mg by mouth 2 (two) times daily.      Marland Kitchen lisinopril-hydrochlorothiazide (ZESTORETIC) 20-12.5 MG per tablet Take 1 tablet by mouth daily.  90 tablet  3  . metFORMIN (GLUCOPHAGE) 1000 MG tablet Take 1 tablet (1,000 mg total) by mouth 2 (two) times daily. For diabetes  180 tablet  3  . metoprolol (LOPRESSOR) 50 MG tablet Take 0.5 tablets (25 mg total) by mouth 2 (two) times daily.  90 tablet  3   No current facility-administered medications on file prior to visit.      Review of Systems:  Behavioral/Psych: positive for tobacco use, stress     Objective:   Physical Exam: BP 168/78  Pulse 74  Temp(Src) 97.7 F (36.5 C) (Oral)  Ht 6' (1.829 m)  Wt 174 lb 9.6 oz (79.198 kg)  BMI 23.67 kg/m2  General: alert, well appearing, and in no distress Cardiovascular: RRR, nl S1 and S2, no carotid bruit, no abdominal bruit Pulmonary: clear to auscultation bilaterally and upper airway wheezes Back:  Appearance: sciolosis no Palpation: tenderness of paraspinal muscles yes, spinous process no; pelvis no  Neuro: Strength hip flexion 5/5, hip abduction 5/5, hip adduction 5/5,  knee extension 5/5, knee flexion 5/5, dorsiflexion 5/5, plantar flexion 5/5 Reflexes: patella 2/2 Bilateral  Achilles 2/2 Bilateral Straight Leg Raise: negative Sensation to light touch intact: yes    Labs:   Diabetic  Labs:  Lab Results  Component Value Date   HGBA1C 8.8 11/26/2013   HGBA1C 9.5 07/26/2013   HGBA1C 10.2 03/04/2013   Lab Results  Component Value Date   LDLCALC 106* 11/29/2010   CREATININE 1.21 05/01/2013   Last microalbumin: No results found for this basename: MICROALBUR,  [id number]        Assessment & Plan:

## 2013-11-27 LAB — DRUG SCREEN, URINE
Benzodiazepines.: NEGATIVE
Creatinine,U: 63.91 mg/dL
Methadone: NEGATIVE
Phencyclidine (PCP): NEGATIVE
Propoxyphene: NEGATIVE

## 2013-11-28 MED ORDER — INSULIN ASPART PROT & ASPART (70-30 MIX) 100 UNIT/ML PEN: PEN_INJECTOR | SUBCUTANEOUS | Status: DC

## 2013-11-28 NOTE — Assessment & Plan Note (Signed)
A: still poorly controlled mainly due to erratic eating habits  P:  - cont insulin 70/30 at 25 units qAM and 15 units qPM until pt can improve diet - encouraged decreased eating prior to bedtime

## 2013-11-28 NOTE — Assessment & Plan Note (Signed)
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     

## 2014-02-19 ENCOUNTER — Encounter: Payer: Self-pay | Admitting: Family Medicine

## 2014-02-19 ENCOUNTER — Ambulatory Visit (INDEPENDENT_AMBULATORY_CARE_PROVIDER_SITE_OTHER): Payer: Self-pay | Admitting: Family Medicine

## 2014-02-19 VITALS — BP 120/63 | HR 65 | Temp 98.0°F | Ht 72.0 in | Wt 174.4 lb

## 2014-02-19 DIAGNOSIS — IMO0001 Reserved for inherently not codable concepts without codable children: Secondary | ICD-10-CM

## 2014-02-19 DIAGNOSIS — E1165 Type 2 diabetes mellitus with hyperglycemia: Secondary | ICD-10-CM

## 2014-02-19 DIAGNOSIS — O1002 Pre-existing essential hypertension complicating childbirth: Secondary | ICD-10-CM

## 2014-02-19 DIAGNOSIS — I10 Essential (primary) hypertension: Secondary | ICD-10-CM

## 2014-02-19 DIAGNOSIS — IMO0002 Reserved for concepts with insufficient information to code with codable children: Secondary | ICD-10-CM

## 2014-02-19 DIAGNOSIS — M545 Low back pain, unspecified: Secondary | ICD-10-CM

## 2014-02-19 LAB — POCT GLYCOSYLATED HEMOGLOBIN (HGB A1C): Hemoglobin A1C: 9.9

## 2014-02-19 MED ORDER — METOPROLOL TARTRATE 50 MG PO TABS: 25.0000 mg | ORAL_TABLET | Freq: Two times a day (BID) | ORAL | Status: DC

## 2014-02-19 MED ORDER — HYDROCODONE-ACETAMINOPHEN 10-325 MG PO TABS: 1.0000 | ORAL_TABLET | Freq: Three times a day (TID) | ORAL | Status: DC | PRN

## 2014-02-19 MED ORDER — LISINOPRIL-HYDROCHLOROTHIAZIDE 20-12.5 MG PO TABS: 1.0000 | ORAL_TABLET | Freq: Every day | ORAL | Status: DC

## 2014-02-19 MED ORDER — METFORMIN HCL 1000 MG PO TABS: 1000.0000 mg | ORAL_TABLET | Freq: Two times a day (BID) | ORAL | Status: DC

## 2014-02-19 NOTE — Patient Instructions (Signed)
[name],   Great to see you today. You gotta do your best with the diet, because you have a very difficult type of diabetes. It is not easy to control.   I am here to help. Please come see me in June. It will be my last month.   Sincerely,   Dr. Clinton SawyerWilliamson

## 2014-02-19 NOTE — Assessment & Plan Note (Signed)
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

## 2014-02-19 NOTE — Progress Notes (Signed)
Patient ID: Colin Keller, male   DOB: 1956-02-03, 58 y.o.   MRN: [id number]   Subjective:   Diabetes  Recent Issues:  Hypoglycemia: Yes - blood sugars to 62  Medication Compliance: Diabetes medication: Yes - see below Taking ACE-I: Yes Taking statin: No - intolerance of statin  Behavioral: Home CBG Monitoring: Yes range 62-385, not checking daily Diet changes: No - Continues to eat potatoes and biscuits with frequency Exercise: No  Health Maintenance: Visual problems: Yes - has another treatment upcoming for diabetic retinopathy Last eye exam: 1 months ago Last dental visit: unknown Foot ulcers: No   Chronic Back Pain -  Stable, no falls or trauma, no weakness or saddle anesthesia or urinary retention, using hydrocodone PRN  Current Outpatient Prescriptions on File Prior to Visit  Medication Sig Dispense Refill  . aspirin 81 MG tablet Take 81 mg by mouth daily.      . cyclobenzaprine (FLEXERIL) 10 MG tablet Take 1 tablet (10 mg total) by mouth 3 (three) times daily as needed for muscle spasms.  30 tablet  2  . famotidine (PEPCID) 20 MG tablet Take 20 mg by mouth 2 (two) times daily.      Marland Kitchen. HYDROcodone-acetaminophen (NORCO) 10-325 MG per tablet Take 1 tablet by mouth 3 (three) times daily as needed. For severe pain  90 tablet  0  . HYDROcodone-acetaminophen (NORCO) 10-325 MG per tablet Take 1 tablet by mouth every 8 (eight) hours as needed.  90 tablet  0  . HYDROcodone-acetaminophen (NORCO) 10-325 MG per tablet Take 1 tablet by mouth every 8 (eight) hours as needed.  90 tablet  0  . Insulin Aspart Prot & Aspart (70-30) 100 UNIT/ML SUPN 25 units q AM and 15 units QHS  15 mL  0  . lisinopril-hydrochlorothiazide (ZESTORETIC) 20-12.5 MG per tablet Take 1 tablet by mouth daily.  90 tablet  3  . metFORMIN (GLUCOPHAGE) 1000 MG tablet Take 1 tablet (1,000 mg total) by mouth 2 (two) times daily. For diabetes  180 tablet  3  . metoprolol (LOPRESSOR) 50 MG tablet Take 0.5 tablets (25 mg  total) by mouth 2 (two) times daily.  90 tablet  3   No current facility-administered medications on file prior to visit.     Review of Systems:  See HPI     Objective:   Physical Exam: BP 120/63  Pulse 65  Temp(Src) 98 F (36.7 C) (Oral)  Ht 6' (1.829 m)  Wt 174 lb 6.4 oz (79.107 kg)  BMI 23.65 kg/m2  General: alert, well appearing, and in no distress Eyes: deferred Cardiovascular: RRR, nl S1 and S2 Pulmonary: clear to auscultation bilaterally Feet: warm, good capillary refill  Labs:   Diabetic Labs:  Lab Results  Component Value Date   HGBA1C 9.9 02/19/2014   HGBA1C 8.8 11/26/2013   HGBA1C 9.5 07/26/2013   Lab Results  Component Value Date   LDLCALC 106* 11/29/2010   CREATININE 1.21 05/01/2013   Last microalbumin: No results found for this basename: MICROALBUR, [id number]        Assessment & Plan:

## 2014-02-19 NOTE — Assessment & Plan Note (Signed)
A: worsening control mainly due to erratic eating habits   P:  - cont insulin 70/30 at 25 units qAM and 15 units qPM until pt can improve diet - encouraged decreased eating prior to bedtime

## 2014-03-13 ENCOUNTER — Telehealth: Payer: Self-pay | Admitting: Family Medicine

## 2014-03-13 ENCOUNTER — Other Ambulatory Visit: Payer: Self-pay | Admitting: *Deleted

## 2014-03-13 DIAGNOSIS — M549 Dorsalgia, unspecified: Principal | ICD-10-CM

## 2014-03-13 DIAGNOSIS — G8929 Other chronic pain: Secondary | ICD-10-CM

## 2014-03-13 MED ORDER — CYCLOBENZAPRINE HCL 10 MG PO TABS: 10.0000 mg | ORAL_TABLET | Freq: Three times a day (TID) | ORAL | Status: DC | PRN

## 2014-03-13 NOTE — Telephone Encounter (Signed)
Rx sent, please notify pt. Thank you.

## 2014-03-13 NOTE — Telephone Encounter (Signed)
Called pt. Informed. .Colin Keller  

## 2014-03-13 NOTE — Telephone Encounter (Signed)
Will fwd to PCP for review. .Colin Keller  

## 2014-03-13 NOTE — Telephone Encounter (Signed)
Needs refill on flexaril.  Is leaving town this afternoon and would like it refilled before they leave today Pharmacy --[facility]

## 2014-05-21 ENCOUNTER — Ambulatory Visit (INDEPENDENT_AMBULATORY_CARE_PROVIDER_SITE_OTHER): Payer: Self-pay | Admitting: Family Medicine

## 2014-05-21 ENCOUNTER — Encounter: Payer: Self-pay | Admitting: Family Medicine

## 2014-05-21 VITALS — BP 149/74 | HR 64 | Ht 72.0 in | Wt 167.0 lb

## 2014-05-21 DIAGNOSIS — M545 Low back pain, unspecified: Secondary | ICD-10-CM

## 2014-05-21 DIAGNOSIS — IMO0001 Reserved for inherently not codable concepts without codable children: Secondary | ICD-10-CM

## 2014-05-21 DIAGNOSIS — E1165 Type 2 diabetes mellitus with hyperglycemia: Secondary | ICD-10-CM

## 2014-05-21 DIAGNOSIS — IMO0002 Reserved for concepts with insufficient information to code with codable children: Secondary | ICD-10-CM

## 2014-05-21 LAB — POCT GLYCOSYLATED HEMOGLOBIN (HGB A1C): Hemoglobin A1C: 10.4

## 2014-05-21 MED ORDER — HYDROCODONE-ACETAMINOPHEN 10-325 MG PO TABS: 1.0000 | ORAL_TABLET | Freq: Three times a day (TID) | ORAL | Status: DC | PRN

## 2014-05-21 MED ORDER — DULAGLUTIDE 1.5 MG/0.5ML ~~LOC~~ SOAJ: 1.5000 mg | SUBCUTANEOUS | Status: DC

## 2014-05-21 NOTE — Assessment & Plan Note (Signed)
A: stable  P: continue norco, given 3 mos worth of prescriptions

## 2014-05-21 NOTE — Progress Notes (Signed)
Patient ID: Colin Keller, male   DOB: 03/03/1956, 58 y.o.   MRN: [id number]   Subjective:   Diabetes  Recent Issues:  Hypoglycemia: yes, 3x per week in the afternoons, but that patient rarely checks his sugar, he usually eats something high in carbs like a honey bun  Medication Compliance: Diabetes medication: Yes - NPH insulin 25 units q AM and 15 units q PM Taking ACE-I: Yes Taking statin: No - intolerance of statin  Behavioral: Home CBG Monitoring: Yes range 50's-300's, not checking daily Diet changes: No - his diet is high in carbohydrates and is always erratic leading to symptoms of hypoglycemia in the afternoon with overcorrection  Exercise: No  Health Maintenance: Visual problems: Yes - pt undergoing treatment for diabetic retinopathy with Dr. 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 aspirin 81 MG tablet Take 81 mg by mouth daily.      . cyclobenzaprine (FLEXERIL) 10 MG tablet Take 1 tablet (10 mg total) by mouth 3 (three) times daily as needed for muscle spasms.  30 tablet  2  . famotidine (PEPCID) 20 MG tablet Take 20 mg by mouth 2 (two) times daily.      Marland Kitchen HYDROcodone-acetaminophen (NORCO) 10-325 MG per tablet Take 1 tablet by mouth 3 (three) times daily as needed. For severe pain  90 tablet  0  . HYDROcodone-acetaminophen (NORCO) 10-325 MG per tablet Take 1 tablet by mouth every 8 (eight) hours as needed.  90 tablet  0  . HYDROcodone-acetaminophen (NORCO) 10-325 MG per tablet Take 1 tablet by mouth every 8 (eight) hours as needed.  90 tablet  0  . Insulin Aspart Prot &  Aspart (70-30) 100 UNIT/ML SUPN 25 units q AM and 15 units QHS  15 mL  0  . lisinopril-hydrochlorothiazide (ZESTORETIC) 20-12.5 MG per tablet Take 1 tablet by mouth daily.  90 tablet  3  . metFORMIN (GLUCOPHAGE) 1000 MG tablet Take 1 tablet (1,000 mg total) by mouth 2 (two) times daily.  180 tablet  3  . metoprolol (LOPRESSOR) 50 MG tablet Take 0.5 tablets (25 mg total) by mouth 2 (two) times daily.  90 tablet  3   No current facility-administered medications on file prior to visit.   Social- Pt very excited about upcoming birth of a granddaughter   Review of Systems:  See HPI     Objective:   Physical Exam: BP 149/74  Pulse 64  Ht 6' (1.829 m)  Wt 167 lb (75.751 kg)  BMI 22.64 kg/m2  General: alert, well appearing, and in no distress Eyes: deferred Cardiovascular: RRR, nl S1 and S2 Pulmonary: clear to auscultation bilaterally Feet: warm, good capillary refill Back: normal appearance   Labs:   Diabetic Labs:  Lab Results  Component Value Date   HGBA1C 10.4 05/21/2014   HGBA1C 9.9 02/19/2014   HGBA1C 8.8 11/26/2013   Lab Results  Component Value Date   LDLCALC 106* 11/29/2010   CREATININE 1.21 05/01/2013   Last microalbumin: No results found for this basename: MICROALBUR,  [id number]        Assessment &  Plan:

## 2014-05-21 NOTE — Patient Instructions (Addendum)
Dear Mr. Kobak,   Thank you for coming to clinic today. Please read below regarding the issues that we discussed.   1. Diabetes - Your A1C is 10.4. I don't like it getting low in the afternoon, so drop the morning dose to 20 units. I am going to send a prescriptions for a medication to the health department called Trulicity. You will have to schedule a time to sign up for medication assistance. Then you can get the medication and bring it here for assistance with starting.    2. Pain - Continue the hydrocodone as needed. Please take alleve as needed as well.   3. Call Abundio Miu to set up a meeting for the [facility].   Please follow up in clinic Dr. Raymondo Band once you get the medicine. Please call earlier if you have any questions or concerns.   Sincerely,   Dr. Clinton Sawyer

## 2014-05-21 NOTE — Assessment & Plan Note (Signed)
A: still very poorly controlled due to diet and erratic eating habits as well as financial constraints for trying other medications P:  - Decrease AM NPH insulin to 20 units, keep PM NPH insulin at 15 units - Start trulicity once weekly injection (rx faxed to health dept pharmacy, pt will need to sign up for MAP, then bring medicine to pharmacy clinic for instruction) - Cont Metformin 1000 mg BID - Counseled on eating habits - Given rx for pneumovax and tdap to take to health dept pharmacy

## 2014-07-21 ENCOUNTER — Encounter: Payer: Self-pay | Admitting: Family Medicine

## 2014-07-21 ENCOUNTER — Ambulatory Visit (INDEPENDENT_AMBULATORY_CARE_PROVIDER_SITE_OTHER): Payer: Self-pay | Admitting: Family Medicine

## 2014-07-21 VITALS — BP 144/50 | HR 61 | Temp 98.4°F | Ht 72.0 in | Wt 165.0 lb

## 2014-07-21 DIAGNOSIS — IMO0002 Reserved for concepts with insufficient information to code with codable children: Secondary | ICD-10-CM

## 2014-07-21 DIAGNOSIS — E1165 Type 2 diabetes mellitus with hyperglycemia: Secondary | ICD-10-CM

## 2014-07-21 DIAGNOSIS — IMO0001 Reserved for inherently not codable concepts without codable children: Secondary | ICD-10-CM

## 2014-07-21 DIAGNOSIS — E785 Hyperlipidemia, unspecified: Secondary | ICD-10-CM

## 2014-07-21 DIAGNOSIS — R42 Dizziness and giddiness: Secondary | ICD-10-CM

## 2014-07-21 DIAGNOSIS — N183 Chronic kidney disease, stage 3 unspecified: Secondary | ICD-10-CM

## 2014-07-21 DIAGNOSIS — T671XXA Heat syncope, initial encounter: Secondary | ICD-10-CM | POA: Insufficient documentation

## 2014-07-21 LAB — CBC
HCT: 35.2 % — ABNORMAL LOW (ref 39.0–52.0)
Hemoglobin: 11.8 g/dL — ABNORMAL LOW (ref 13.0–17.0)
MCH: 31 pg (ref 26.0–34.0)
MCHC: 33.5 g/dL (ref 30.0–36.0)
MCV: 92.4 fL (ref 78.0–100.0)
Platelets: 290 10*3/uL (ref 150–400)
RBC: 3.81 MIL/uL — ABNORMAL LOW (ref 4.22–5.81)
RDW: 14.2 % (ref 11.5–15.5)
WBC: 10.8 10*3/uL — ABNORMAL HIGH (ref 4.0–10.5)

## 2014-07-21 NOTE — Assessment & Plan Note (Signed)
-   Patient currently under poor control diabetic management. Patient advised to continue current insulin management until current presyncopal symptoms subside. Hypoglycemia is of low suspicion for these recent symptoms. Patient has been asked to followup with me to go over glycemic control and has been urged to increase his daily glucose meter checks significantly.

## 2014-07-21 NOTE — Assessment & Plan Note (Signed)
Follow-up lipid panel 

## 2014-07-21 NOTE — Progress Notes (Signed)
Need assessment and plan documentation for complete note.

## 2014-07-21 NOTE — Assessment & Plan Note (Signed)
Patient stated that symptoms are always experienced when working outside on extremely hot days. 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 A heat related presyncopal event is the most likely diagnosis. Also consideration for hypoglycemic events, secondary to subcutaneous insulin. This is unlikely due to the fact that patient states he has not had blood glucose readings below normal recently and that these symptoms are different than previous episodes he has experienced. Symptomatic anemia also considered however patient denies any gross hematochezia or melena recently. - Patient urged to pre-hydrate before work and to continue to keep his fluids up while working. - Patient advised to remove himself for any ladders or other dangerous environments if he begins to experience these symptoms again. - Patient advised to continue current medication regimens - If symptoms persist or worsen consideration for cardiac workup with EKG advised. - Followup lipid panel, CBC, BMP.

## 2014-07-21 NOTE — Progress Notes (Signed)
Colin HillierIan Anely Spiewak, MD Phone: [phone]  Subjective:  Chief complaint  Pt Here for establishment of care, and recent episodes of lightheadedness while at work.  Patient is a pleasant 58 year old male who presents today for establishment of care. He also states that he has had recent episodes of lightheadedness while at work. His work requires him to work regularly outside in the heat. He states that these lightheaded episodes occur regularly on the "hot days". He states that he is trying to make sure that he stays well hydrated well work. He states that he is losing a lot of water to sweat. He denies any recent palpitations or chest pain. No bloody or black stool. Patient is a type II diabetic. He says that he has not checked his blood sugar during these lightheaded episodes. He states that his blood sugar is regularly in the 150s however he does not check every day.  Patient also states that he experienced the passing of his brother due to an unforeseen and unknown etiology 2 weeks ago. Patient states that the family is still in mourning. He denies any significant depression or feelings of hopelessness. Patient states that he has dealt with grief in the past and that he understands that life will go on.  Review of Systems  Constitutional: Positive for weight loss. Negative for fever, chills and malaise/fatigue.  HENT: Negative for nosebleeds.   Eyes: Negative for blurred vision and double vision.  Respiratory: Negative for cough.   Cardiovascular: Negative for chest pain and palpitations.  Gastrointestinal: Negative for heartburn, nausea, vomiting, diarrhea, blood in stool and melena.  Neurological: Negative for seizures, loss of consciousness and headaches.  Psychiatric/Behavioral: Negative for suicidal ideas.    Past Medical History Patient Active Problem List   Diagnosis Date Noted  . Heat syncope 07/21/2014  . Diabetic retinopathy 08/30/2013  . Insomnia due to medical condition  07/30/2013  . Ureterolithiasis 07/01/2013  . CKD (chronic kidney disease) stage 3, GFR 30-59 ml/min 03/05/2013  . DM (diabetes mellitus), type 2, uncontrolled 10/24/2012  . Colon cancer screening 10/24/2012  . AC joint dislocation 07/26/2012  . Frozen shoulder 06/13/2012  . NICOTINE ADDICTION 07/29/2009  . HYPERTENSION, BENIGN 12/22/2008  . HYPERLIPIDEMIA 12/21/2008  . BACK PAIN, LUMBAR 10/16/2008  . 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 aspirin 81 MG tablet Take 81 mg by mouth daily.      . cyclobenzaprine (FLEXERIL) 10 MG tablet Take 1 tablet (10 mg total) by mouth 3 (three) times daily as needed for muscle spasms.  30 tablet  2  . Dulaglutide (TRULICITY) 1.5 MG/0.5ML SOPN Inject 1.5 mg into the skin once a week.  4 pen  5  . famotidine (PEPCID) 20 MG tablet Take 20 mg by mouth 2 (two) times daily.      Marland Kitchen. HYDROcodone-acetaminophen (NORCO) 10-325 MG per tablet Take 1 tablet by mouth every 8 (eight) hours as needed.  90 tablet  0  . HYDROcodone-acetaminophen (NORCO) 10-325 MG per tablet Take 1 tablet by mouth every 8 (eight) hours as needed.  90 tablet  0  . HYDROcodone-acetaminophen (NORCO) 10-325 MG per tablet Take 1 tablet by mouth 3 (three) times daily as needed. For severe pain  90 tablet  0  . Insulin Aspart Prot & Aspart (70-30) 100 UNIT/ML SUPN 25 units q AM and 15 units QHS  15 mL  0  . lisinopril-hydrochlorothiazide (ZESTORETIC) 20-12.5 MG per tablet Take 1  tablet by mouth daily.  90 tablet  3  . metFORMIN (GLUCOPHAGE) 1000 MG tablet Take 1 tablet (1,000 mg total) by mouth 2 (two) times daily.  180 tablet  3  . metoprolol (LOPRESSOR) 50 MG tablet Take 0.5 tablets (25 mg total) by mouth 2 (two) times daily.  90 tablet  3   No current facility-administered medications for this visit.    Objective: BP 144/50  Pulse 61  Temp(Src) 98.4 F (36.9 C) (Oral)  Ht 6' (1.829 m)  Wt 165  lb (74.844 kg)  BMI 22.37 kg/m2 Gen: NAD, alert, cooperative with exam HEENT: NCAT, EOMI, PERRL, poor dentition CV: RRR, good S1/S2, no murmur Resp: CTABL, no wheezes, non-labored Abd: Soft, Non Tender, Non Distended, BS present, no guarding or organomegaly Ext: No edema, warm Neuro: Alert and oriented, No gross deficits   Assessment/Plan:  Heat syncope Patient stated that symptoms are always experienced when working outside on extremely hot days. 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 A heat related presyncopal event is the most likely diagnosis. Also consideration for hypoglycemic events, secondary to subcutaneous insulin. This is unlikely due to the fact that patient states he has not had blood glucose readings below normal recently and that these symptoms are different than previous episodes he has experienced. Symptomatic anemia also considered however patient denies any gross hematochezia or melena recently. - Patient urged to pre-hydrate before work and to continue to keep his fluids up while working. - Patient advised to remove himself for any ladders or other dangerous environments if he begins to experience these symptoms again. - Patient advised to continue current medication regimens - If symptoms persist or worsen consideration for cardiac workup with EKG advised. - Followup lipid panel, CBC, BMP.   DM (diabetes mellitus), type 2, uncontrolled - Patient currently under poor control diabetic management. Patient advised to continue current insulin management until current presyncopal symptoms subside. Hypoglycemia is of low suspicion for these recent symptoms. Patient has been asked to followup with me to go over glycemic control and has been urged to increase his daily glucose meter checks significantly.  HYPERLIPIDEMIA Followup lipid panel.    Orders Placed This Encounter  Procedures  . CBC  . Lipid Panel  .  Basic Metabolic Panel    No orders of the defined types were placed in this encounter.

## 2014-07-21 NOTE — Patient Instructions (Signed)
Is was a pleasure meeting you today. Today we discussed your recent episodes of dizziness and lightheadedness while working. We discussed your current glucose regimen. We also discussed how you've been coping with the recent loss of your brother.  These are the plans we have decided upon: - Continue current diabetic control regimen. - Monitor your blood sugars more regularly, to the best of your abilities.  - Pre-hydrate prior to working outside for prolonged period of time. - Continue to be mindful of your fluid loss while you work, and do your best replenishment lost fluids. - If you ever feel  dizzy/lightheaded while at work please sit down in the shade or air-conditioning immediately. I would also recommend discontinuing work for the day if this happens. - Please understand that someone is always available here at the clinic if you have any need to discuss this time of grief you are currently in.   It was a pleasure meeting you and I look forward to further development of this relationship in the future.

## 2014-07-22 LAB — BASIC METABOLIC PANEL
BUN: 40 mg/dL — ABNORMAL HIGH (ref 6–23)
CO2: 23 mEq/L (ref 19–32)
Calcium: 9.9 mg/dL (ref 8.4–10.5)
Chloride: 100 mEq/L (ref 96–112)
Creat: 1.42 mg/dL — ABNORMAL HIGH (ref 0.50–1.35)
Glucose, Bld: 300 mg/dL — ABNORMAL HIGH (ref 70–99)
POTASSIUM: 5.8 meq/L — AB (ref 3.5–5.3)
SODIUM: 133 meq/L — AB (ref 135–145)

## 2014-07-22 LAB — LIPID PANEL
Cholesterol: 186 mg/dL (ref 0–200)
HDL: 50 mg/dL (ref >39)
LDL Cholesterol: 112 mg/dL — ABNORMAL HIGH (ref 0–99)
Total CHOL/HDL Ratio: 3.7 Ratio
Triglycerides: 121 mg/dL (ref <150)
VLDL: 24 mg/dL (ref 0–40)

## 2014-08-11 ENCOUNTER — Telehealth: Payer: Self-pay | Admitting: Family Medicine

## 2014-08-11 ENCOUNTER — Telehealth: Payer: Self-pay | Admitting: *Deleted

## 2014-08-11 NOTE — Telephone Encounter (Signed)
Pt called and made an appointment for 10/1 since this was the earliest appointment Dr. Wende Mott had. The patient would like the doctor to call and talk to him. Colin Keller

## 2014-08-11 NOTE — Telephone Encounter (Signed)
Pt informed. Will call the front office to make a f/u appt sooner than oct. [place], [place] CMA

## 2014-08-11 NOTE — Telephone Encounter (Signed)
Message copied by Pamelia Hoit on Mon Aug 11, 2014  4:19 PM ------      Message from: [facility], IAN D      Created: Mon Aug 11, 2014 11:56 AM      Regarding: f/u       Can you please contact this patient to ensure he makes a f/u appt w/ me soon? His DM is not well controlled and his kidneys are taking a hit for it.            Thank you!                  ----- Message -----         From: [facility]         Sent: 07/21/2014   6:17 PM           To: Kathee Delton, MD                   ------

## 2014-08-15 NOTE — Telephone Encounter (Signed)
Pt called and would like his refill on hydrocodone left up front for pick today. Please call when ready, also the patient still would like to talk to Dr. Wende Mott. Please call [phone]. Myriam Jacobson

## 2014-08-17 NOTE — Telephone Encounter (Signed)
Called patient back. No one to answer. Left voicemail on answering machine. Informed patient that he would need to schedule a followup appointment in order to have any refills of hydrocodone (worded this as "prescription you had asked about"). Informed patient that if I did not have any available appointments in the near future and he should try to reschedule with a colleague of mine.

## 2014-08-19 ENCOUNTER — Encounter: Payer: Self-pay | Admitting: Family Medicine

## 2014-08-19 ENCOUNTER — Ambulatory Visit (INDEPENDENT_AMBULATORY_CARE_PROVIDER_SITE_OTHER): Payer: Self-pay | Admitting: Family Medicine

## 2014-08-19 VITALS — BP 115/46 | HR 66 | Temp 98.1°F | Ht 72.0 in | Wt 165.5 lb

## 2014-08-19 DIAGNOSIS — M25511 Pain in right shoulder: Secondary | ICD-10-CM

## 2014-08-19 DIAGNOSIS — IMO0002 Reserved for concepts with insufficient information to code with codable children: Secondary | ICD-10-CM

## 2014-08-19 DIAGNOSIS — E1165 Type 2 diabetes mellitus with hyperglycemia: Secondary | ICD-10-CM

## 2014-08-19 DIAGNOSIS — M545 Low back pain, unspecified: Secondary | ICD-10-CM

## 2014-08-19 DIAGNOSIS — N183 Chronic kidney disease, stage 3 unspecified: Secondary | ICD-10-CM

## 2014-08-19 DIAGNOSIS — M25519 Pain in unspecified shoulder: Secondary | ICD-10-CM

## 2014-08-19 DIAGNOSIS — G8929 Other chronic pain: Secondary | ICD-10-CM | POA: Insufficient documentation

## 2014-08-19 DIAGNOSIS — IMO0001 Reserved for inherently not codable concepts without codable children: Secondary | ICD-10-CM

## 2014-08-19 MED ORDER — HYDROCODONE-ACETAMINOPHEN 10-325 MG PO TABS: 1.0000 | ORAL_TABLET | Freq: Three times a day (TID) | ORAL | Status: DC | PRN

## 2014-08-19 NOTE — Assessment & Plan Note (Addendum)
Stable - Last Cr checked 07/21/14 at 1.42 seems to be at baseline (1.3 to 1.5)  Plan: 1. No changes today. Discussed recent lab results, answered questions about diagnosis of CKD and relation to DM. Advised importance of controlling DM to improve kidneys 2. Recommend re-check Cr q 6 months to 1 year at latest, to monitor while on Metformin, if consistently Cr > 1.6 would consider discontinue Metformin, may need inc insulin 3. Advised avoid regular NSAID use, may continue intermittent Ibuprofen, but not daily

## 2014-08-19 NOTE — Assessment & Plan Note (Addendum)
Poorly controlled Complications - DM retinopathy, CKD-III, peripheral neuropathy  Plan:  1. Continue current therapy 2. advised to check CBG up to 3x daily, bring in glucose log to next visit 3. Not due for A1c today 4. Lifestyle Mods - Improved DM diet (dec carbs, inc vegs), recommended regular exercise 5. Consider starting Gabapentin at next follow-up visit for suspected peripheral neuropathy, will need DM foot exam as well 6. If Cr remains elevated > 1.6 in future, consider DC Metformin

## 2014-08-19 NOTE — Assessment & Plan Note (Signed)
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 Continue daily stretching and exercises 2. Continue analgesia with Norco 3. May benefit from future PT referral, and/or Sports Med referral / evaluation 4. Consider R-shoulder joint injection for bursitis, for treatment and diagnostic ability to determine if component of rotator cuff injury

## 2014-08-19 NOTE — Assessment & Plan Note (Signed)
Pain consistent with prior chronic pain, stable, without flare or red flag symptoms - Known DJD and disc disease, d/t OA and prior injuries - Controlled on Norco  Plan: 1. Refilled Norco 10/325mg  q 8 hr PRN (#90, 0 refills), x 2 separate refill rx printed with do not fill dates before 09/11/14, and 10/12/14 2. Advised also to use conservative measures, stretching, exercises, heating pad, topical analgesia PRN

## 2014-08-19 NOTE — Patient Instructions (Signed)
Dear Colin Keller, Thank you for coming in to clinic today.  Today we discussed your Recent Lab Results and Kidney Function / Chronic Pain and Refills. 1. As discussed, you have "Chronic Kidney Disease - CKD - Stage 3", we will continue to monitor your kidney function with blood work yearly. This is unchanged over the past 2 years. Most commonly caused by Diabetes and High Blood pressure. One thing to consider is future use of Metformin, we will notify you if your kidney function declines to a point where we need to discontinue this. - Recommend avoiding Ibuprofen or Aleve as these may worsen your kidney function 2. Continue checking your blood sugar daily (fasting in morning, 2 hours after lunch and 2 hours after dinner), please write down your log and bring this is in to your next visit 3. You may benefit from starting Gabapentin / Neurontin for peripheral neuropathy with Diabetes, please discuss this with your doctor at next visit. 4. For your back pain, refilled Hydrocodone for 3 months.  Please keep your follow-up appointment with Dr. Wende Mott on 09/11/14 @ 2:00pm for Diabetes Check-up, please arrive 15 minutes early.  If you have any other questions or concerns, please feel free to call the clinic to contact me. You may also schedule an earlier appointment if necessary.  However, if your symptoms get significantly worse, please go to the Emergency Department to seek immediate medical attention.  Saralyn Pilar, DO [facility]

## 2014-08-19 NOTE — Progress Notes (Signed)
   Subjective:    Patient ID: Colin Keller, male    DOB: 12/01/1956, 58 y.o.   MRN: [id number]  HPI  CHRONIC DM, Type 2: Reports no concerns. Not due for A1c today, states he has DM follow-up appointment next month CBGs: Unsure of accurate average or Low / High CBG. Checks CBGs - not daily Meds: 70/30 with 25u AM and 15u PM, Metformin  BID, Dulaglutide injections 1.5mg  weekly Reports good compliance. 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 - Additionally, with Right shoulder with reported "torn rotator cuff", states difficulty raising arm above head, admits to repetitive activity makes it worse - Requested Norco refill for 3 months - Denies any fevers/chills, leg weakness, numbness, saddle anesthesia, urinary retention or incontinence. No new injuries, trauma or falls  Chronic Kidney Disease, CKD-Stage III - Requests to go over recent lab work, asks about "kidney disease" as a complication of diabetes - Admits to recently improved hydration - Denies significant change in urination  I have reviewed and updated the following as appropriate: allergies and current medications  Social Hx: - Active smoker >1ppd   Review of Systems  See above HPI    Objective:   Physical Exam  BP 115/46  Pulse 66  Temp(Src) 98.1 F (36.7 C) (Oral)  Ht 6' (1.829 m)  Wt 165 lb 8 oz  (75.07 kg)  BMI 22.44 kg/m2  Gen - chronically ill-appearing, cooperative, NAD HEENT - MMM Neck - supple, mild R>L C-spine paraspinal muscle tenderness and hypertonicity Heart - RRR, no murmurs heard Lungs - CTAB. Normal work of breathing. MSK - Right shoulder limited ROM with flexion above head and internal rotation, mild tenderness to palpation anterior rotator cuff musculature, R>L supraspinatus weakness on rotator cuff strength testing, notable Right shoulder impingement testing (positive). Back: non-tender over spinous processes, mild L>R Lumbar / SI paraspinal tenderness on palpation. Seated SLR negative for radicular symptoms. Ext - non-tender, no edema, peripheral pulses intact +2 b/l Skin - warm, dry Neuro - awake, alert, grossly non-focal, intact muscle strength 5/5 b/l, intact distal sensation to light touch, gait normal     Assessment & Plan:   See specific A&P problem list for details.

## 2014-09-11 ENCOUNTER — Encounter: Payer: Self-pay | Admitting: Family Medicine

## 2014-09-11 ENCOUNTER — Ambulatory Visit (INDEPENDENT_AMBULATORY_CARE_PROVIDER_SITE_OTHER): Payer: Self-pay | Admitting: Family Medicine

## 2014-09-11 VITALS — BP 118/75 | HR 68 | Temp 98.5°F | Wt 161.0 lb

## 2014-09-11 DIAGNOSIS — Z720 Tobacco use: Secondary | ICD-10-CM

## 2014-09-11 DIAGNOSIS — E1165 Type 2 diabetes mellitus with hyperglycemia: Secondary | ICD-10-CM

## 2014-09-11 DIAGNOSIS — N183 Chronic kidney disease, stage 3 unspecified: Secondary | ICD-10-CM

## 2014-09-11 DIAGNOSIS — IMO0002 Reserved for concepts with insufficient information to code with codable children: Secondary | ICD-10-CM

## 2014-09-11 DIAGNOSIS — G8929 Other chronic pain: Secondary | ICD-10-CM

## 2014-09-11 DIAGNOSIS — M25511 Pain in right shoulder: Secondary | ICD-10-CM

## 2014-09-11 DIAGNOSIS — F17200 Nicotine dependence, unspecified, uncomplicated: Secondary | ICD-10-CM

## 2014-09-11 LAB — POCT GLYCOSYLATED HEMOGLOBIN (HGB A1C): HEMOGLOBIN A1C: 10.5

## 2014-09-11 MED ORDER — NICOTINE POLACRILEX 4 MG MT LOZG: 4.0000 mg | LOZENGE | OROMUCOSAL | Status: DC | PRN

## 2014-09-11 MED ORDER — NICOTINE 7 MG/24HR TD PT24: 7.0000 mg | MEDICATED_PATCH | Freq: Every day | TRANSDERMAL | Status: DC

## 2014-09-11 NOTE — Progress Notes (Signed)
Patient ID: Colin Keller, male   DOB: 08/08/1956, 58 y.o.   MRN: [id number]  Mickie Hillier, MD, MS Phone: [phone]  Subjective:  Chief complaint  Pt Here for diabetes followup. Patient is here for diabetes followup. He states that his blood sugars have been adequately controlled. He is willing to change his diet. He states that he is ready to get healthier. Patient appears to be extremely motivated.  Patient and wife discussed with me at length his intention to quit smoking. I have heard him significant amount of information about various techniques and options available to him for quitting. She was very receptive to this. He states that he is tried to quit 2 times in the past most recent time he was able to quit for 187 days before relapsing. He states that he has his first grandchild on the way and he would like to quit smoking for her.  He also discussed some of his issues with urinating. He states that recently he has noticed his stream getting weaker. He does not find himself having any hesitancy or urgency while urinating. No dysuria. Symptoms appear to be similar to that of BPH. I offered that at our next visit to examine him for this. Patient states that in the past he had been placed on Flomax while in the hospital. He states that this worked extremely well for him, and would be interested in taking it if it would help him with this problem.   Review of Systems  Constitutional: Positive for diaphoresis. Negative for fever, chills and malaise/fatigue.  HENT: Negative for sore throat.   Eyes: Negative for blurred vision.  Respiratory: Negative for cough, sputum production, shortness of breath and wheezing.   Cardiovascular: Negative for chest pain.  Gastrointestinal: Negative for nausea, vomiting, abdominal pain and diarrhea.  Genitourinary: Negative for dysuria, urgency and frequency.  Musculoskeletal: Positive for joint pain.  Skin: Negative for itching and rash.  Neurological:  Positive for dizziness. Negative for tremors, seizures, loss of consciousness and headaches.  Psychiatric/Behavioral: The patient is nervous/anxious.      Past Medical History Patient Active Problem List   Diagnosis Date Noted  . Chronic right shoulder pain 08/19/2014  . Heat syncope 07/21/2014  . Diabetic retinopathy 08/30/2013  . Insomnia due to medical condition 07/30/2013  . Ureterolithiasis 07/01/2013  . CKD (chronic kidney disease) stage 3, GFR 30-59 ml/min 03/05/2013  . DM (diabetes mellitus), type 2, uncontrolled 10/24/2012  . Colon cancer screening 10/24/2012  . AC joint dislocation 07/26/2012  . Frozen shoulder 06/13/2012  . NICOTINE ADDICTION 07/29/2009  . HYPERTENSION, BENIGN 12/22/2008  . HYPERLIPIDEMIA 12/21/2008  . BACK PAIN, LUMBAR 10/16/2008  . 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 aspirin 81 MG tablet Take 81 mg by mouth daily.      . cyclobenzaprine (FLEXERIL) 10 MG tablet Take 1 tablet (10 mg total) by mouth 3 (three) times daily as needed for muscle spasms.  30 tablet  2  . Dulaglutide (TRULICITY) 1.5 MG/0.5ML SOPN Inject 1.5 mg into the skin once a week.  4 pen  5  . famotidine (PEPCID) 20 MG tablet Take 20 mg by mouth 2 (two) times daily.      Marland Kitchen HYDROcodone-acetaminophen (NORCO) 10-325 MG per tablet Take 1 tablet by mouth every 8 (eight) hours as needed.  90 tablet  0  . Insulin Aspart Prot & Aspart (70-30) 100 UNIT/ML SUPN 25 units q  AM and 15 units QHS  15 mL  0  . lisinopril-hydrochlorothiazide (ZESTORETIC) 20-12.5 MG per tablet Take 1 tablet by mouth daily.  90 tablet  3  . metFORMIN (GLUCOPHAGE) 1000 MG tablet Take 1 tablet (1,000 mg total) by mouth 2 (two) times daily.  180 tablet  3  . metoprolol (LOPRESSOR) 50 MG tablet Take 0.5 tablets (25 mg total) by mouth 2 (two) times daily.  90 tablet  3  . nicotine (EQ NICOTINE) 7 mg/24hr patch Place 1 patch (7 mg  total) onto the skin daily.  28 patch  0  . nicotine polacrilex (EQ NICOTINE) 4 MG lozenge Take 1 lozenge (4 mg total) by mouth as needed for smoking cessation.  100 tablet  0   No current facility-administered medications for this visit.    Objective: BP 118/75  Pulse 68  Temp(Src) 98.5 F (36.9 C) (Oral)  Wt 161 lb (73.029 kg) Gen: NAD, alert, cooperative with exam HEENT: NCAT, EOMI, PERRL CV: RRR, good S1/S2, no murmur Resp: CTABL, no wheezes, non-labored Abd: Soft, Non Tender, Non Distended, BS present, no guarding or organomegaly Ext: No edema, warm Neuro: Alert and oriented, No gross deficits   Assessment/Plan:  NICOTINE ADDICTION This was discussed at length with patient and wife. Patient is ready to quit soon. He is tried in the past and has been successful for approximately 6 months with eventual relapse. He believes that he can completely stop this time with his motivation been that he will have a new granddaughter (the first) who is due at the end of this month. - We discussed the various methods available to help in this process including: Nicotine replacement via gum/lozenges/patch/inhaler, medication like Wellbutrin and Chantix. - Patient was very receptive to this amount of options. He was not interested in medication at this time. - He had discussed the positive effects he could have by choosing a long active nicotine replacement like a dermal patch and a more short acting form like a, or lozenge. - Patient asked to have me write for a prescription for a dermal patch and nicotine lozenges. - Patient did not want a large dose with the patch because he had previously experienced nausea and vomiting with a high-dose patch he bought over-the-counter. - EQ nicotine 7 mg per 24-hour patch x28; prescribed - EQ nicotine 4 mg lozenge x100; prescribed  DM (diabetes mellitus), type 2, uncontrolled He discussed diabetes control at length. He and his wife are very anxious to and  the long-term affects of this disease. Patient appear to be extremely motivated to begin healthier habits. Patient had a A1c of 10.5. This corresponds with his report of consistently having glucose levels in the 200 and 300s. Patient reported being compliant with his regular insulin regimen with the exception of cutting back on his afternoon dosing. I asked that he be very strict on his regimen of 25 units in the morning and 15 units at night. And to come back and see me in 2-3 weeks to reevaluate. Patient is also on 1000 mg metformin twice a day.  I asked the patient tract his glucose levels at least 2 times every day. For the next 2 weeks. I will then adjust his insulin regimen accordingly. From his reports it appears that his fasting glucose levels can sometimes be his highest. If that appears to be the case then I will most likely increase his nightly regimen by approximately 4 units (10% overall increase). Also I may reevaluate his creatinine levels.  He is currently very close to the threshold of 1.6. If he is to surpass this threshold I will need to discontinue his metformin secondary to this loss of kidney function.  Consider starting on gabapentin for her diabetic neuropathy.   CKD (chronic kidney disease) stage 3, GFR 30-59 ml/min Secondary to uncontrolled diabetes. See notes on diabetes. Continue to monitor creatinine levels. If creatinine jumps above 1.6 strongly recommend to discontinue metformin.  Chronic right shoulder pain Considering referral to physical therapy. Left shoulder is beginning to bother him as well.  However, patient without insurance.  HYPERLIPIDEMIA - Consider atorvastatin at a future visit.    Orders Placed This Encounter  Procedures  . POCT A1C    Meds ordered this encounter  Medications  . nicotine (EQ NICOTINE) 7 mg/24hr patch    Sig: Place 1 patch (7 mg total) onto the skin daily.    Dispense:  28 patch    Refill:  0  . nicotine polacrilex (EQ  NICOTINE) 4 MG lozenge    Sig: Take 1 lozenge (4 mg total) by mouth as needed for smoking cessation.    Dispense:  100 tablet    Refill:  0    Due to various cancellations in my schedule I was able to spend a significant amount of time with this patient. Approximately 60 minutes were spent with this patient discussing various treatment options and counseling.

## 2014-09-11 NOTE — Patient Instructions (Signed)
It was a please seeing you today in our clinic. Today we discussed your diabetes, and quitting smoking. Here is the treatment plan we have discussed and agreed upon together:  - we want you to be strict with your insulin regimen. 25 units in the morning and 15 units at night - Let me know when you decide on a quitting date. - Come back and see us in 2-3 weeks.  - Notice for up front: it is ok to double book me w/ this patient for f/u in 2-3 weeks.

## 2014-09-11 NOTE — Assessment & Plan Note (Signed)
He discussed diabetes control at length. He and his wife are very anxious to and the long-term affects of this disease. Patient appear to be extremely motivated to begin healthier habits. Patient had a A1c of 10.5. This corresponds with his report of consistently having glucose levels in the 200 and 300s. Patient reported being compliant with his regular insulin regimen with the exception of cutting back on his afternoon dosing. I asked that he be very strict on his regimen of 25 units in the morning and 15 units at night. And to come back and see me in 2-3 weeks to reevaluate. Patient is also on 1000 mg metformin twice a day.  I asked the patient tract his glucose levels at least 2 times every day. For the next 2 weeks. I will then adjust his insulin regimen accordingly. From his reports it appears that his fasting glucose levels can sometimes be his highest. If that appears to be the case then I will most likely increase his nightly regimen by approximately 4 units (10% overall increase). Also I may reevaluate his creatinine levels. He is currently very close to the threshold of 1.6. If he is to surpass this threshold I will need to discontinue his metformin secondary to this loss of kidney function.  Consider starting on gabapentin for her diabetic neuropathy.

## 2014-09-11 NOTE — Assessment & Plan Note (Signed)
Considering referral to physical therapy. Left shoulder is beginning to bother him as well.  However, patient without insurance.

## 2014-09-11 NOTE — Assessment & Plan Note (Signed)
-   Consider atorvastatin at a future visit.

## 2014-09-11 NOTE — Assessment & Plan Note (Signed)
Secondary to uncontrolled diabetes. See notes on diabetes. Continue to monitor creatinine levels. If creatinine jumps above 1.6 strongly recommend to discontinue metformin.

## 2014-09-11 NOTE — Assessment & Plan Note (Signed)
This was discussed at length with patient and wife. Patient is ready to quit soon. He is tried in the past and has been successful for approximately 6 months with eventual relapse. He believes that he can completely stop this time with his motivation been that he will have a new granddaughter (the first) who is due at the end of this month. - We discussed the various methods available to help in this process including: Nicotine replacement via gum/lozenges/patch/inhaler, medication like Wellbutrin and Chantix. - Patient was very receptive to this amount of options. He was not interested in medication at this time. - He had discussed the positive effects he could have by choosing a long active nicotine replacement like a dermal patch and a more short acting form like a, or lozenge. - Patient asked to have me write for a prescription for a dermal patch and nicotine lozenges. - Patient did not want a large dose with the patch because he had previously experienced nausea and vomiting with a high-dose patch he bought over-the-counter. - EQ nicotine 7 mg per 24-hour patch x28; prescribed - EQ nicotine 4 mg lozenge x100; prescribed

## 2014-09-18 ENCOUNTER — Telehealth: Payer: Self-pay | Admitting: Family Medicine

## 2014-09-18 NOTE — Telephone Encounter (Signed)
Wife calls to inform Dr/. McKeag that pt's target date to quit smoking was Monday 09/15/14. He is doing great with patches and lozenges at this time. FYI

## 2014-09-19 NOTE — Telephone Encounter (Signed)
I received a message that wife had called informing me that patient had begun his long road to smoking cessation on Monday, 09/15/2014. I called patient at his mobile phone to see how this process is going. At our last visit I provided him with nicotine replacement patches and lozenges. Patient did not answer his phone. I left a message on his voicemail with positive reinforcement and instructions to call me if she requires any additional help. I hope that patient continues to stay motivated in order to get through this difficult process.

## 2014-10-02 ENCOUNTER — Ambulatory Visit (INDEPENDENT_AMBULATORY_CARE_PROVIDER_SITE_OTHER): Payer: Self-pay | Admitting: Family Medicine

## 2014-10-02 ENCOUNTER — Encounter: Payer: Self-pay | Admitting: Family Medicine

## 2014-10-02 VITALS — BP 108/68 | HR 100 | Temp 98.2°F | Wt 169.0 lb

## 2014-10-02 DIAGNOSIS — IMO0002 Reserved for concepts with insufficient information to code with codable children: Secondary | ICD-10-CM

## 2014-10-02 DIAGNOSIS — Z23 Encounter for immunization: Secondary | ICD-10-CM

## 2014-10-02 DIAGNOSIS — F17200 Nicotine dependence, unspecified, uncomplicated: Secondary | ICD-10-CM

## 2014-10-02 DIAGNOSIS — I10 Essential (primary) hypertension: Secondary | ICD-10-CM

## 2014-10-02 DIAGNOSIS — E1165 Type 2 diabetes mellitus with hyperglycemia: Secondary | ICD-10-CM

## 2014-10-02 DIAGNOSIS — Z720 Tobacco use: Secondary | ICD-10-CM

## 2014-10-02 NOTE — Assessment & Plan Note (Signed)
Well-controlled HTN Complications - CKD-III  Plan:  1. No changes - continue current BP meds.  2. Not due for labs - monitor Cr q 6 months to 1 year 3. Anticipate improvement s/p tobacco free - quit smoking 09/2014

## 2014-10-02 NOTE — Progress Notes (Signed)
Patient ID: Colin KaufmanDavid W Keller, male   DOB: 06/01/1956, 58 y.o.   MRN: [id number]   Subjective:   HPI  CHRONIC DM, Type 2: - States morning and evening sugars improved, fasting AM CBGs 140s, recently changed to take full 15u dose in evening now with QHS snack, previously had not been taking regular insulin nightly CBGs: Avg 180, Low 65 (symptomatic, shaking), High 325. Checks CBGs 2x daily Meds: 70/30 with 25u AM and 15u PM, Metformin 1000mg  BID Reports improved compliance now. Tolerating well w/o side-effects Currently on ACEi Lifestyle: Diet (improved DM diet) Admits rare episodes hypoglycemia, bilateral feet burning and occasional tingling Denies polyuria, visual changes, numbness or tingling.  CHRONIC HTN: Reports no concerns Current Meds - Lisinopril-HCTZ 20-12.5mg  daily, Metoprolol 25mg  BID (half tab 50) Reports good compliance, took meds today. Tolerating well, w/o complaints. Denies CP, dyspnea, HA, edema, dizziness / lightheadedness   HX TOBACCO ABUSE: - Last OV 09/11/14 with extensive smoking cessation provided, rx NRT (patches, lozenges) - Patient proudly states he has quit smoking and has now been tobacco free x 18 days. Initially used some NRT, however he states that he is no longer needing these medicines. Overall, "feels better", some "taste and smell have improved already". Reports being in good spirits  HM: - Requesting "pertussis vaccine" due to upcoming arrival of new grandson  I have reviewed and updated the following as appropriate: allergies and current medications  Social Hx: - Former smoker - quit 09/2014  Review of Systems  See above HPI    Objective:   Physical Exam  BP 108/68  Pulse 100  Temp(Src) 98.2 F (36.8 C) (Oral)  Wt 169 lb (76.658 kg)  Gen - chronically ill-appearing, pleasant, NAD HEENT - oropharynx clear, MMM Heart - mildly tachycardic, regular rhythm, no murmurs heard Lungs - CTAB, no wheezing, no focal crackles or rhonchi. Normal  work of breathing. Ext - non-tender, no edema, peripheral pulses intact +2 b/l Skin - warm, dry Neuro - awake, alert, grossly non-focal, intact muscle strength 5/5 b/l grip and ankles, intact distal sensation to light touch, gait normal     Assessment & Plan:   See specific A&P problem list for details.

## 2014-10-02 NOTE — Assessment & Plan Note (Signed)
Improved control, recently adjusted insulin regimen and more importantly, seems to have improved adherence to treatment Complications - DM retinopathy, CKD-III, peripheral neuropathy  Plan:  1. No change today to current insulin regimen - 70/30, 25u AM and 15u PM. Occasional hypoglycemia episodes down to CBG 60s and pt without detailed CBG log or meter today, recommend continue without changes today 2. Continue lifestyle mods with improved DM diet. Recommend regular exercise 3. Offered Gabapentin for c/o peripheral neuropathy, pt to consider but decline today 4. Recommend re-check Cr q 6 months to 1 year, if Cr stable can continue Metformin 5. RTC 2.5 months for A1c, DM f/u

## 2014-10-02 NOTE — Patient Instructions (Signed)
Dear Colin Keller, Thank you for coming in to clinic today. It was good to see you! Congratulations on quitting smoking!  1. For your Diabetes - it sounds like your blood sugar is under better control. Continue the current strategy (insulin and Metformin). No changes today. Keep working on improved Diabetic Diet (reduced carbs and starches, increase fresh vegetables), also encourage regular exercise with walking (all of these things will help, and make it so that you need less medicine in the future) 2. For your Blood pressure - Improved today - no changes 3. Continue to stay smoke free - let us know if you need anything 4. Follow-up as needed for shoulder pain.  Some important numbers from today's visit: BP - 108/68  You received TDap vaccine today (for pertussis)  Please schedule a follow-up appointment with Dr. Wende MottMcKeag within 2 to 3 months for next Diabetic Follow-up for A1c  If you have any other questions or concerns, please feel free to call the clinic to contact me. You may also schedule an earlier appointment if necessary.  However, if your symptoms get significantly worse, please go to the Emergency Department to seek immediate medical attention.  Saralyn PilarAlexander Vara Mairena, DO [facility]

## 2014-10-02 NOTE — Assessment & Plan Note (Signed)
Successfully quit smoking on 09/15/14, reports tobacco free x 18 days. Doing well, currently no longer using NRT  Plan: 1. Provided encouragement and continued assistance for continuing to stay tobacco free

## 2014-11-05 ENCOUNTER — Ambulatory Visit (INDEPENDENT_AMBULATORY_CARE_PROVIDER_SITE_OTHER): Payer: Self-pay | Admitting: Family Medicine

## 2014-11-05 VITALS — BP 145/75 | HR 52 | Temp 98.0°F | Ht 72.0 in | Wt 169.3 lb

## 2014-11-05 DIAGNOSIS — M545 Low back pain, unspecified: Secondary | ICD-10-CM

## 2014-11-05 MED ORDER — HYDROCODONE-ACETAMINOPHEN 10-325 MG PO TABS: 1.0000 | ORAL_TABLET | Freq: Three times a day (TID) | ORAL | Status: DC | PRN

## 2014-11-05 NOTE — Patient Instructions (Signed)
Good to see you. Follow up with your primary doctor within the next month to discuss chronic pain management, and then every 3 months. Seek immediate care if you have sudden weakness, loss of bowel or bladder function. Continue to stay active (walk daily) but avoid heavy lifting and especially lifting and pivoting. Use heat/ice therapy in addition to your pain medication. Do your physical therapy exercises every day to keep your back stretched and strong. Also work on strengthening your buttocks and legs.  Best,  Leona SingletonMaria T Vraj Denardo, MD
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
## 2014-11-08 NOTE — Assessment & Plan Note (Signed)
Chronic lumbar radiculopathy with worsening reportedly since he quit smoking. No red flag symptoms of sudden weakness or incontinence of urine or stool. Aware of need to f/u with PCP for med refill but no appointments were available. - Norco refilled x 1 month (fill date 12/1).  - Stay active. Back stretches and quad/hip abduction/buttock strengthening discussed. No insurance so cannot afford PT referral. - Heat/ice discussed. - Return precautions reviewed. - F/u with PCP within 1 month.

## 2014-12-15 ENCOUNTER — Ambulatory Visit (INDEPENDENT_AMBULATORY_CARE_PROVIDER_SITE_OTHER): Payer: Self-pay | Admitting: Family Medicine

## 2014-12-15 ENCOUNTER — Encounter: Payer: Self-pay | Admitting: Family Medicine

## 2014-12-15 VITALS — BP 119/76 | HR 80 | Temp 98.2°F | Ht 72.0 in | Wt 175.2 lb

## 2014-12-15 DIAGNOSIS — IMO0002 Reserved for concepts with insufficient information to code with codable children: Secondary | ICD-10-CM

## 2014-12-15 DIAGNOSIS — M545 Low back pain, unspecified: Secondary | ICD-10-CM

## 2014-12-15 DIAGNOSIS — E1165 Type 2 diabetes mellitus with hyperglycemia: Secondary | ICD-10-CM

## 2014-12-15 LAB — POCT GLYCOSYLATED HEMOGLOBIN (HGB A1C): Hemoglobin A1C: 11.3

## 2014-12-15 MED ORDER — INSULIN ASPART PROT & ASPART (70-30 MIX) 100 UNIT/ML PEN: PEN_INJECTOR | SUBCUTANEOUS | Status: DC

## 2014-12-15 MED ORDER — HYDROCODONE-ACETAMINOPHEN 10-325 MG PO TABS: 1.0000 | ORAL_TABLET | Freq: Three times a day (TID) | ORAL | Status: DC | PRN

## 2014-12-15 NOTE — Progress Notes (Signed)
   HPI  Patient presents today for diabetes follow-up. Patient states that he has not had any significant issues regarding his diabetes at this time. He reports feeling a little "cabin fever" over the past 2 months because he has had little work due to the season of the year. Patient also states that he is been gaining approximately 10 pounds since his decision to quit smoking. Patient also reports that he has successfully quit smoking since the last time I saw him in clinic. He believes it is been about 90 days.  Patient denies any signs or symptoms of hypoglycemia since our last visit. He does state that he occasionally reduces his insulin coverage because of his own tendency to miss meals.  Patient is also asked for a refill on his pain medication.  Smoking status noted ROS: Per HPI  Objective: BP 119/76 mmHg  Pulse 80  Temp(Src) 98.2 F (36.8 C) (Oral)  Ht 6' (1.829 m)  Wt 79.47 kg (175 lb 3.2 oz)  BMI 23.76 kg/m2 Gen: NAD, alert, cooperative with exam HEENT: NCAT, EOMI, PERRL CV: RRR Resp: CTABL, no wheezes, non-labored Ext: No edema, warm Neuro: Alert and oriented, No gross deficits  Assessment and plan:  DM (diabetes mellitus), type 2, uncontrolled Patient's A1c today is 11.3. This is up from previous value. Patient has gained weight since our last visit likely secondary to his recent cessation of smoking. I will continue to monitor this.  - I've increased patient's insulin regimen.  - A.m. dose of 25 units will remain the same.  - P.m. dose of 15 units will be increased to 17 units at this time.   I have asked patient to see me again in 2 weeks. Patient states that that may be too soon as he may not be able to cover this financially. He and I both agreed that see me back in one month would be more fiscally responsible for him. I've asked that he do much better job of taking his glucose measurements daily since that we may go over it when he sees me back in one  month.    Orders Placed This Encounter  Procedures  . HgB A1c    Meds ordered this encounter  Medications  . Insulin Aspart Prot & Aspart (NOVOLOG 70/30 MIX) (70-30) 100 UNIT/ML Pen    Sig: 25 units q AM and 17 units QHS    Dispense:  15 mL    Refill:  0  . HYDROcodone-acetaminophen (NORCO) 10-325 MG per tablet    Sig: Take 1 tablet by mouth every 8 (eight) hours as needed.    Dispense:  90 tablet    Refill:  0    Do not fill before 11/11/14     Kathee Delton, MD,MS,  PGY1 12/15/2014 7:35 PM

## 2014-12-15 NOTE — Patient Instructions (Signed)
It was a pleasure seeing you today in our clinic. Today we discussed diabetes. Here is the treatment plan we have discussed and agreed upon together:   - A1c was 11.3; we will increase insulin to 25u in the morning; and 17u at night. - I would like to see you back here next month to check your sugars.

## 2014-12-15 NOTE — Assessment & Plan Note (Addendum)
Patient's A1c today is 11.3. This is up from previous value. Patient has gained weight since our last visit likely secondary to his recent cessation of smoking. I will continue to monitor this.  - I've increased patient's insulin regimen.  - A.m. dose of 25 units will remain the same.  - P.m. dose of 15 units will be increased to 17 units at this time.   I have asked patient to see me again in 2 weeks. Patient states that that may be too soon as he may not be able to cover this financially. He and I both agreed that see me back in one month would be more fiscally responsible for him. I've asked that he do much better job of taking his glucose measurements daily since that we may go over it when he sees me back in one month.

## 2015-01-09 ENCOUNTER — Ambulatory Visit (INDEPENDENT_AMBULATORY_CARE_PROVIDER_SITE_OTHER): Payer: Self-pay | Admitting: Family Medicine

## 2015-01-09 ENCOUNTER — Encounter: Payer: Self-pay | Admitting: Family Medicine

## 2015-01-09 VITALS — BP 124/68 | HR 63 | Temp 97.9°F | Ht 70.0 in | Wt 172.4 lb

## 2015-01-09 DIAGNOSIS — M25511 Pain in right shoulder: Secondary | ICD-10-CM

## 2015-01-09 DIAGNOSIS — E1165 Type 2 diabetes mellitus with hyperglycemia: Secondary | ICD-10-CM

## 2015-01-09 DIAGNOSIS — IMO0002 Reserved for concepts with insufficient information to code with codable children: Secondary | ICD-10-CM

## 2015-01-09 DIAGNOSIS — G8929 Other chronic pain: Secondary | ICD-10-CM

## 2015-01-09 MED ORDER — TRAMADOL HCL 50 MG PO TABS: 50.0000 mg | ORAL_TABLET | Freq: Two times a day (BID) | ORAL | Status: DC | PRN

## 2015-01-09 NOTE — Assessment & Plan Note (Signed)
Patient had been previously prescribed Norco for pain control. We had a long discussion about the risks and benefits of opiate use. I informed him of my lack of trust in opiates as a long-term solution for chronic pain. I initially prescribed patient tramadol as a compromise from hydrocodone. Patient seemed okay with this change. After discharge patient presented again with his wife who stated that he had artery been on tramadol years ago and that this did not help. She informed me that he was given this medication immediately after injuring his neck and that his pain was not well controlled time. I informed her that because this issue appeared to be a acute insult to his vertebra, unlike his current chronic pain, and that this issue occurred many years ago that I would like to attempt another trial of tramadol for his pain control. - Tramadol twice a day - Low threshold for radiographic imaging of patient's C & T spines. Not yet appropriate.

## 2015-01-09 NOTE — Assessment & Plan Note (Signed)
Patient is currently on 28 units of NovoLog 70/30 in the morning and 15 units at night. Patient states that he is feeling better and is happy with this current regimen. We discussed the possibility of changing this regimen to be 3 times daily by keeping his total unit dosing the same and spreading out each injection 3 times. Patient stated that he did not like the idea of taking 3 injections a day. - We will continue to hold his NovoLog 70/30 regimen at its current amount. - I've asked patient to follow-up in 2 months in which he will be ready for a new A1c.
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
## 2015-01-09 NOTE — Patient Instructions (Signed)
Diabetes, Eating Away From Home Sometimes, you might eat in a restaurant or have meals that are prepared by someone else. You can enjoy eating out. However, the portions in restaurants may be much larger than needed. Listed below are some ideas to help you choose foods that will keep your blood glucose (sugar) in better control.  TIPS FOR EATING OUT  Know your meal plan and how many carbohydrate servings you should have at each meal. You may wish to carry a copy of your meal plan in your purse or wallet. Learn the foods included in each food group.  Make a list of restaurants near you that offer healthy choices. Take a copy of the carry-out menus to see what they offer. Then, you can plan what you will order ahead of time.  Become familiar with serving sizes by practicing them at home using measuring cups and spoons. Once you learn to recognize portion sizes, you will be able to correctly estimate the amount of total carbohydrate you are allowed to eat at the restaurant. Ask for a takeout box if the portion is more than you should have. When your food comes, leave the amount you should have on the plate, and put the rest in the takeout box before you start eating.  Plan ahead if your mealtime will be different from usual. Check with your caregiver to find out how to time meals and medicine if you are taking insulin.  Avoid high-fat foods, such as fried foods, cream sauces, high-fat salad dressings, or any added butter or margarine.  Do not be afraid to ask questions. Ask your server about the portion size, cooking methods, ingredients and if items can be substituted. Restaurants do not list all available items on the menu. You can ask for your main entree to be prepared using skim milk, oil instead of butter or margarine, and without gravy or sauces. Ask your waiter or waitress to serve salad dressings, gravy, sauces, margarine, and sour cream on the side. You can then add the amount your meal plan  suggests.  Add more vegetables whenever possible.  Avoid items that are labeled "jumbo," "giant," "deluxe," or "supersized."  You may want to split an entre with someone and order an extra side salad.  Watch for hidden calories in foods like croutons, bacon, or cheese.  Ask your server to take away the bread basket or chips from your table.  Order a dinner salad as an appetizer. You can eat most foods served in a restaurant. Some foods are better choices than others. Breads and Starches  Recommended: All kinds of bread (wheat, rye, white, oatmeal, Italian, French, raisin), hard or soft dinner rolls, frankfurter or hamburger buns, small bagels, small corn or whole-wheat flour tortillas.  Avoid: Frosted or glazed breads, butter rolls, egg or cheese breads, croissants, sweet rolls, pastries, coffee cake, glazed or frosted doughnuts, muffins. Crackers  Recommended: Animal crackers, graham, rye, saltine, oyster, and matzoth crackers. Bread sticks, melba toast, rusks, pretzels, popcorn (without fat), zwieback toast.  Avoid: High-fat snack crackers or chips. Buttered popcorn. Cereals  Recommended: Hot and cold cereals. Whole grains such as oatmeal or shredded wheat are good choices.  Avoid: Sugar-coated or granola type cereals. Potatoes/Pasta/Rice/Beans  Recommended: Order baked, boiled, or mashed potatoes, rice or noodles without added fat, whole beans. Order gravies, butter, margarine, or sauces on the side so you can control the amount you add.  Avoid: Hash browns or fried potatoes. Potatoes, pasta, or rice prepared with cream or   cheese sauce. Potato or pasta salads prepared with large amounts of dressing. Fried beans or fried rice. Vegetables  Recommended: Order steamed, baked, boiled, or stewed vegetables without sauces or extra fat. Ask that sauce be served on the side. If vegetables are not listed on the menu, ask what is available.  Avoid: Vegetables prepared with cream,  butter, or cheese sauce. Fried vegetables. Salad Bars  Recommended: Many of the vegetables at a salad bar are considered "free." Use lemon juice, vinegar, or low-calorie salad dressing (fewer than 20 calories per serving) as "free" dressings for your salad. Look for salad bar ingredients that have no added fat or sugar such as tomatoes, lettuce, cucumbers, broccoli, carrots, onions, and mushrooms.  Avoid: Prepared salads with large amounts of dressing, such as coleslaw, caesar salad, macaroni salad, bean salad, or carrot salad. Fruit  Recommended: Eat fresh fruit or fresh fruit salad without added dressing. A salad bar often offers fresh fruit choices, but canned fruit at a restaurant is usually packed in sugar or syrup.  Avoid: Sweetened canned or frozen fruits, plain or sweetened fruit juice. Fruit salads with dressing, sour cream, or sugar added to them. Meat and Meat Substitutes  Recommended: Order broiled, baked, roasted, or grilled meat, poultry, or fish. Trim off all visible fat. Do not eat the skin of poultry. The size stated on the menu is the raw weight. Meat shrinks by  in cooking (for example, 4 oz raw equals 3 oz cooked meat).  Avoid: Deep-fat fried meat, poultry, or fish. Breaded meats. Eggs  Recommended: Order soft, hard-cooked, poached, or scrambled eggs. Omelets may be okay, depending on what ingredients are added. Egg substitutes are also a good choice.  Avoid: Fried eggs, eggs prepared with cream or cheese sauce. Milk  Recommended: Order low-fat or fat-free milk according to your meal plan. Plain, nonfat yogurt or flavored yogurt with no sugar added may be used as a substitute for milk. Soy milk may also be used.  Avoid: Milk shakes or sweetened milk beverages. Soups and Combination Foods  Recommended: Clear broth or consomm are "free" foods and may be used as an appetizer. Broth-based soups with fat removed count as a starch serving and are preferred over cream  soups. Soups made with beans or split peas may be eaten but count as a starch.  Avoid: Fatty soups, soup made with cream, cheese soup. Combination foods prepared with excessive amounts of fat or with cream or cheese sauces. Desserts and Sweets  Recommended: Ask for fresh fruit. Sponge or angel food cake without icing, ice milk, no sugar added ice cream, sherbet, or frozen yogurt may fit into your meal plan occasionally.  Avoid: Pastries, puddings, pies, cakes with icing, custard, gelatin desserts. Fats and Oils  Recommended: Choose healthy fats such as olive oil, canola oil, or tub margarine, reduced fat or fat-free sour cream, cream cheese, avocado, or nuts.  Avoid: Any fats in excess of your allowed portion. Deep-fried foods or any food with a large amount of fat. Note: Ask for all fats to be served on the side, and limit your portion sizes according to your meal plan. Document Released: 11/28/2005 Document Revised: 02/20/2012 Document Reviewed: 02/25/2014 [employer] Patient Information 2015 [employer]. This information is not intended to replace advice given to you by your health care provider. Make sure you discuss any questions you have with your health care provider.  

## 2015-01-21 ENCOUNTER — Telehealth: Payer: Self-pay | Admitting: Family Medicine

## 2015-01-21 NOTE — Telephone Encounter (Signed)
Pt called and would like to speak to Dr. Wende MottMcKeag about his medications he is on. Please call him on his cell. jw

## 2015-01-22 NOTE — Telephone Encounter (Signed)
Called patient's cell phone. Got his voicemail. Left him a message informing him I'd be in the hospital the rest of the week but I would like him to call our office w/ a number and an estimated time I would be able to reach him either tomorrow or Saturday. I informed him I could not guarantee an accurate time I'd be available but I would try to contact him around the time he says he's available.

## 2015-01-22 NOTE — Telephone Encounter (Signed)
Called patient and asked if there was a message I could take for Dr. Wende MottMcKeag regarding his medications. Patient states he only wants to speak with Dr. Wende MottMcKeag, will forward to PCP.

## 2015-01-29 NOTE — Telephone Encounter (Signed)
Pt returned his call. Dr can reach pt in the afternoon  Best number to use is [phone] [facility]

## 2015-02-02 ENCOUNTER — Telehealth: Payer: Self-pay | Admitting: *Deleted

## 2015-02-02 ENCOUNTER — Other Ambulatory Visit: Payer: Self-pay | Admitting: Family Medicine

## 2015-02-02 MED ORDER — HYDROCODONE-ACETAMINOPHEN 5-325 MG PO TABS: 1.0000 | ORAL_TABLET | Freq: Three times a day (TID) | ORAL | Status: DC

## 2015-02-02 NOTE — Telephone Encounter (Signed)
As able to get a hold of patient tonight. Patient is currently complaining of flulike symptoms and has an appointment for tomorrow with the same-day provider. Our phone conversation was about his pain medication. I change this at our previous visit. He had been taking 10/325 Norco for his pain. I change this to tramadol 50 mg twice a day. Patient stated that this was not working form. I informed him that he was allowed to increase this dosing to 100 mg twice a day, and that this medication still had room to be increased. Patient was not very pleased with this news and asked to be placed back on his original narcotic. I informed him that I was not comfortable with him being on a chronic narcotic, even though tramadol is still an opioid. Patient voices understanding. I asked that he please attempts using his tramadol at the increased dose and to keep an open mind force efficacy. If his pain did not have relief after short time I would be willing to refill a prescription for Norco after that short period. Patient agreed to this plan.  I do not have much confidence that patient will find relief with the tramadol 100 mg twice a day dosing. I believe that the majority of patients in a position similar to this would not find success by these methods. This is because I believe that the majority of success with these pain medications is psychological. Patient has already decided that tramadol does not work for him. With that said I've asked him to meet me halfway due to my hesitancy to place anyone on long-term opiates--and patient complied.  I have already printed and signed Norco prescription. This prescription will be waiting for patient at the front of her clinic if/when patient calls to report pain relief failure on tramadol.

## 2015-02-02 NOTE — Telephone Encounter (Signed)
Pt's wife called requesting what to do with pt "catching a bug from another hospital".  Pt has been having fever, chills and no appetite.  Pt has been keeping hydrated.  Blood glucose now is 360.  Per Dr. Wende MottMcKeag; pt should go to urgent care to check blood glucose.  Mrs. Chestine SporeClark; they are keeping hydrated and trying to get to eat something to help bring down blood glucose.  Pt's daughter walked in with multi symptom relief medication from drug store for family to take.  Mrs. Chestine SporeClark stated pt's daughter did ask the pharmacist what her father could take with him being diabetic.  Mrs. Chestine SporeClark stated they did not have any insurance to go to an urgent care.  Appt scheduled for tomorrow 02/03/2015 at 3:15 PM.  Mrs. Chestine SporeClark stated she will call and cancel appt if the medication starts to help.  Clovis PuMartin, Neyla Gauntt L, RN

## 2015-02-03 ENCOUNTER — Ambulatory Visit: Payer: Self-pay | Admitting: Family Medicine

## 2015-02-03 ENCOUNTER — Telehealth: Payer: Self-pay | Admitting: *Deleted

## 2015-02-03 NOTE — Telephone Encounter (Signed)
Pt's wife called again to inform nurse that temp 103.4 two tylenol 500 mg about 20 minutes ago; now 103.9.  Pt has been drinking water and ginger ale.  Precept with Dr. Jennette KettleNeal sending pt to ED.  Clovis PuMartin, Misako Roeder L, RN

## 2015-02-03 NOTE — Telephone Encounter (Signed)
Pt's wife called stating that PCP told pt he would like him seen in clinic on Thursday or Friday.  Appt for today was canceled.  Please Advise. Colin Keller, Tamika L, RN

## 2015-02-03 NOTE — Telephone Encounter (Signed)
During our phone conversation yesterday I definitely did NOT inform patient to cancel today's appointment. We only briefly discussed his current ailment -- which I made sure he was not taking any NSAIDs for his fever, I recommended tylenol. Our discussion primarily discussed his pain medication management.

## 2015-02-04 NOTE — Telephone Encounter (Signed)
pts wife [name] is asking to speak with Tamika re: pts fever and sugar. Also needs to find out when pt needs to come in to see the doctor.

## 2015-02-04 NOTE — Telephone Encounter (Signed)
Spoke with pt's wife Colin Keller. Colin Keller advised that Dr. Wende MottMcKeag did not tell pt to cancel appt for yesterday. Appt scheduled for 02/05/2015 for same day provider.  Pt did not go to ED as advised yesterday.  Colin Keller stated blood glucose went down to 214; down from 388 and temp went down to 98.3.  Advised to come into clinic to have a provider due an assessment since he has been feeling bad for several days now. Pt and wife stated understanding. Advise both not to call and reschedule/cancel appt.  Clovis PuMartin, Samina Weekes L, RN

## 2015-02-05 ENCOUNTER — Encounter: Payer: Self-pay | Admitting: Family Medicine

## 2015-02-05 ENCOUNTER — Ambulatory Visit (INDEPENDENT_AMBULATORY_CARE_PROVIDER_SITE_OTHER): Payer: Self-pay | Admitting: Family Medicine

## 2015-02-05 VITALS — BP 117/67 | HR 53 | Temp 98.4°F | Wt 173.2 lb

## 2015-02-05 DIAGNOSIS — J111 Influenza due to unidentified influenza virus with other respiratory manifestations: Secondary | ICD-10-CM

## 2015-02-05 NOTE — Progress Notes (Signed)
  Subjective:     Colin KaufmanDavid W Keller is a 59 y.o. male who presents for evaluation of Sx of flu. Symptoms include achiness, congestion, fever up to 103 F, lightheadedness, nausea without vomiting and non productive cough. Onset of symptoms was 5 days ago, and has been gradually improving since that time. Treatment to date: fluids.  The following portions of the patient's history were reviewed and updated as appropriate: allergies, current medications, past family history, past medical history, past social history, past surgical history and problem list.  Review of Systems Pertinent items are noted in HPI.   Objective:    BP 117/67 mmHg  Pulse 53  Temp(Src) 98.4 F (36.9 C) (Oral)  Wt 173 lb 3 oz (78.557 kg) General appearance: alert, cooperative and appears stated age Nose: Nares normal. Septum midline. Mucosa normal. No drainage or sinus tenderness.,   Throat: O/P clear, MMM Lungs: clear to auscultation bilaterally Heart: regular rate and rhythm, S1, S2 normal, no murmur, click, rub or gallop   Assessment:    Influenza, resolving   Plan:    No tx for Flu at this time > 48-72 hours out.  Recommend hydration w/ water/gatorade, theraflu, f/u PRN.  Good hydration status today, seems to be improving, no red flags

## 2015-02-05 NOTE — Patient Instructions (Signed)

## 2015-02-10 ENCOUNTER — Telehealth: Payer: Self-pay | Admitting: Family Medicine

## 2015-02-10 NOTE — Telephone Encounter (Signed)
Pt needs to talk to dr  about "a couple of things"

## 2015-02-11 NOTE — Telephone Encounter (Signed)
Attempted to call patient this afternoon. Got his voicemail. Will attempt again tomorrow.

## 2015-02-13 ENCOUNTER — Telehealth: Payer: Self-pay | Admitting: *Deleted

## 2015-02-13 NOTE — Telephone Encounter (Signed)
Patient and wife called to discuss PCP change request from Dr. Wende MottMcKeag to Dr. Althea CharonKaramalegos.  Patient states he does not have a good relationship with Dr. Wende MottMcKeag due to lack of communication.  Discussed with Dr. Neal--Ok to change PCP to Dr. Althea CharonKaramalegos.  Scheduled appt to establish care with new PCP for 02/16/15 at 3:15 pm.  Altamese Dilling~Janann Boeve, BSN, RN-BC

## 2015-02-16 ENCOUNTER — Ambulatory Visit (INDEPENDENT_AMBULATORY_CARE_PROVIDER_SITE_OTHER): Payer: Self-pay | Admitting: Family Medicine

## 2015-02-16 ENCOUNTER — Encounter: Payer: Self-pay | Admitting: Family Medicine

## 2015-02-16 VITALS — BP 133/65 | HR 63 | Ht 70.0 in | Wt 170.0 lb

## 2015-02-16 DIAGNOSIS — IMO0002 Reserved for concepts with insufficient information to code with codable children: Secondary | ICD-10-CM

## 2015-02-16 DIAGNOSIS — Z7189 Other specified counseling: Secondary | ICD-10-CM

## 2015-02-16 DIAGNOSIS — E1142 Type 2 diabetes mellitus with diabetic polyneuropathy: Secondary | ICD-10-CM

## 2015-02-16 DIAGNOSIS — G8929 Other chronic pain: Secondary | ICD-10-CM | POA: Insufficient documentation

## 2015-02-16 DIAGNOSIS — I10 Essential (primary) hypertension: Secondary | ICD-10-CM

## 2015-02-16 DIAGNOSIS — M545 Low back pain, unspecified: Secondary | ICD-10-CM

## 2015-02-16 DIAGNOSIS — E785 Hyperlipidemia, unspecified: Secondary | ICD-10-CM

## 2015-02-16 DIAGNOSIS — E1140 Type 2 diabetes mellitus with diabetic neuropathy, unspecified: Secondary | ICD-10-CM | POA: Insufficient documentation

## 2015-02-16 DIAGNOSIS — E1165 Type 2 diabetes mellitus with hyperglycemia: Secondary | ICD-10-CM

## 2015-02-16 LAB — LDL CHOLESTEROL, DIRECT: Direct LDL: 106 mg/dL — ABNORMAL HIGH

## 2015-02-16 MED ORDER — HYDROCODONE-ACETAMINOPHEN 10-325 MG PO TABS
1.0000 | ORAL_TABLET | Freq: Three times a day (TID) | ORAL | Status: DC | PRN
Start: 2015-02-16 — End: 2015-05-06

## 2015-02-16 MED ORDER — ATORVASTATIN CALCIUM 10 MG PO TABS: 10.0000 mg | ORAL_TABLET | Freq: Every day | ORAL | Status: DC

## 2015-02-16 MED ORDER — GABAPENTIN 100 MG PO CAPS: 100.0000 mg | ORAL_CAPSULE | Freq: Three times a day (TID) | ORAL | Status: DC

## 2015-02-16 MED ORDER — HYDROCODONE-ACETAMINOPHEN 10-325 MG PO TABS: 1.0000 | ORAL_TABLET | Freq: Three times a day (TID) | ORAL | Status: DC | PRN

## 2015-02-16 NOTE — Patient Instructions (Signed)
Dear Colin Keller, Thank you for coming in to clinic today. It was good to see you again!  1. For your Diabetes - continue current regimen with 70/30 insulin - no change today, we will re-check A1c (3 month sugar) in 1 month   - work on exercise regimen (walking) 2. For your BP - continue current meds - BP controlled today 3. For your Cholesterol / Prevention - Start Atorvastatin 10mg  daily (gave you 30 day supply and a few refills) - see if you tolerate this low dose, if doing well we can give you 90 day supply if you want, maybe try gradually increasing dose in future 4. For Chronic Back Pain - we will refill your Norco 10/325 - take 3 times daily as needed for pain, given 3 month refills - signed pain contract today and completed UDS  - also recommend heating pad 5. For neuropathy / disc problem  - start Gabapentin 100mg  capsules - take 1 at bedtime for 3 days, then increase to 2 capsules at bedtime, up to 3, once taking 300mg  at night, then add 1 capsules in morning (incresae until 3 in morning and 3 at night)  Some important numbers from today's visit: BP - 133/67 Results -  Check cholesterol lab today before starting new medicine.  Please schedule a follow-up appointment with me in 1 month for Diabetes   If you have any other questions or concerns, please feel free to call the clinic to contact me. You may also schedule an earlier appointment if necessary.  However, if your symptoms get significantly worse, please go to the Emergency Department to seek immediate medical attention.  [name], DO [facility]
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
## 2015-02-17 NOTE — Assessment & Plan Note (Signed)
HLD in setting of DM2  Plan: 1. Start Atorvastatin 10mg  daily - after detailed discussion on elevated ASCVD 10 yr risk calculator - note previous intolerance to statins with Simvastatin 40-80mg  with myalgias 2. If tolerating can provide 90 day supply vs if not tolerating discussed switching to alternative mod intensity statin 3. Check direct LDL today - 106

## 2015-02-17 NOTE — Assessment & Plan Note (Signed)
Well-controlled HTN Complication - CKD-III  Plan: 1. No change today 2. Not due for labs - cont monitor Cr 3. Lifestyle mods - encouraged smoking cessation and inc regular exercise

## 2015-02-17 NOTE — Assessment & Plan Note (Signed)
Bilateral lower ext burning / intermittent numbness - Repeat DM foot exam at next visit - Start Gabapentin 100mg  capsules - titrate up to 300mg  TID

## 2015-02-17 NOTE — Assessment & Plan Note (Signed)
Previously worsening control (last A1c 11.3, up from 10.5) - Not due for A1c today Complicated by peripheral neuropathy, CKD  Plan:  1. Continue current therapy with 70/30 insulin BID 25-30u AM / 15-17u PM - has some episodes of low sugars down to 60-90s, without detailed log or A1c no adjustment today 2. Continue Metformin 1000mg  BID 3. Lifestyle Mods - encouraged cont improved DM diet, strongly advised starting regular exercise 4. Check direct LDL today 5. RTC 1 month for regular DM f/u with foot exam, A1c

## 2015-02-17 NOTE — Assessment & Plan Note (Signed)
Indication for chronic opioid: chronic LBP with h/o lumbar disc herniation and intermittent radicular pain Medication and dose: Norco 10/325mg  q 8 hr PRN pain # pills per month: 90 Last UDS date: 02/16/15 Pain contract signed (Y/N): Y (02/16/15 - signed, to be scanned) - [facility] Date narcotic database last reviewed (include red flags): 02/16/15 - no red flags

## 2015-02-17 NOTE — Assessment & Plan Note (Signed)
Chronic LBP with known h/o intermittent radicular pain, currently stable - No back pain red flags today - previously chronically managed on narcotics with norco  Plan: 1. Refilled Norco 10/325 q 8 hr PRN (#90, 0 refills) printed x 3 refills with appropriate monthly do not fill dates 2. Discussed and [facility] - reviewed new policy, pt initialed, signed 02/16/15, to be scanned into chart 3. Check UDS today 4. Reviewed pharmacy prescribing database with assistance of Dr. McDiarmid - no red flags. 5. Start gabapentin (may also help radicular symptoms) - may benefit from switch to TCA vs SNRI if not improved 6. Consider future referral to ortho for possible lumbar epidural injections if leg pain worsens 7. RTC 3 months for refills

## 2015-02-18 LAB — DRUG SCR UR, PAIN MGMT, REFLEX CONF
Amphetamine Screen, Ur: NEGATIVE
Barbiturate Quant, Ur: NEGATIVE
COCAINE METABOLITES: NEGATIVE
CREATININE, U: 395.52 mg/dL
Marijuana Metabolite: NEGATIVE
Methadone: NEGATIVE
PROPOXYPHENE: NEGATIVE
Phencyclidine (PCP): NEGATIVE

## 2015-02-20 ENCOUNTER — Telehealth: Payer: Self-pay | Admitting: Family Medicine

## 2015-02-20 ENCOUNTER — Other Ambulatory Visit: Payer: Self-pay | Admitting: *Deleted

## 2015-02-20 DIAGNOSIS — E785 Hyperlipidemia, unspecified: Secondary | ICD-10-CM

## 2015-02-20 DIAGNOSIS — I10 Essential (primary) hypertension: Secondary | ICD-10-CM

## 2015-02-20 LAB — OPIATES/OPIOIDS (LC/MS-MS)
CODEINE URINE: NEGATIVE ng/mL (ref <50)
Hydrocodone: 1461 ng/mL — ABNORMAL HIGH (ref <50)
Hydromorphone: 644 ng/mL — ABNORMAL HIGH (ref <50)
Morphine Urine: NEGATIVE ng/mL (ref <50)
NOROXYCODONE, UR: NEGATIVE ng/mL (ref <50)
Norhydrocodone, Ur: 2365 ng/mL — ABNORMAL HIGH (ref <50)
OXYMORPHONE, URINE: NEGATIVE ng/mL (ref <50)
Oxycodone, ur: NEGATIVE ng/mL (ref <50)

## 2015-02-20 LAB — BENZODIAZEPINES (GC/LC/MS), URINE
ALPRAZOLAMU: NEGATIVE ng/mL (ref <25)
Clonazepam metabolite (GC/LC/MS), ur confirm: NEGATIVE ng/mL (ref <25)
FLURAZEPAMU: NEGATIVE ng/mL (ref <50)
Lorazepam (GC/LC/MS), ur confirm: NEGATIVE ng/mL (ref <50)
MIDAZOLAMU: NEGATIVE ng/mL (ref <50)
NORDIAZEPAMU: NEGATIVE ng/mL (ref <50)
OXAZEPAMU: 1250 ng/mL — AB (ref <50)
TEMAZEPAMU: 429 ng/mL — AB (ref <50)
Triazolam metabolite (GC/LC/MS), ur confirm: NEGATIVE ng/mL (ref <50)

## 2015-02-20 MED ORDER — LISINOPRIL-HYDROCHLOROTHIAZIDE 20-12.5 MG PO TABS: 1.0000 | ORAL_TABLET | Freq: Every day | ORAL | Status: DC

## 2015-02-20 MED ORDER — PRAVASTATIN SODIUM 40 MG PO TABS: 40.0000 mg | ORAL_TABLET | Freq: Every day | ORAL | Status: DC

## 2015-02-20 NOTE — Telephone Encounter (Addendum)
Last OV 02/16/15 to establish care with new PCP for DM, HTN, HLD, and chronic pain management. Continued chronic pain management with Norco 10/325 at that time, full evaluation for chronic pain completed at that time including UDS collected (last 11/2013, appropriate), narcotic prescribing database reviewed (no red flags), and new [facility] pain contract reviewed and signed.  Reviewed results UDS 02/16/15 with positive opiates (approriate metabolites for Norco, Hydrocodone) and positive benzodiazapine (metabolites of Valium/Diazepam with oxazepam, temazepam).  Called patient to discuss UDS results. He confirmed that he did recently take a dose of his wife's Diazepam due to uncontrolled pain, he stated that she was aware of this. He apologized and understands that he is not supposed to take other prescribed controlled substances, including from family members. He admits to similar incident previously in 2014, and now he reports that he will no longer take any other controlled substances not prescribed by [facility]. I advised him that I would continue his current prescriptions for Norco at this time and confirmed his understanding of the pain contract, recently signed.  Additionally, he reports that he is now hesitant about starting Lipitor 10mg  for HLD, DM2, ASCVD risk reduction. He is concerned because previously intolerant of Simvastatin 40-80mg  with myalgias. Also his wife previously was on Lipitor and later developed some memory problems. I explained that Lipitor 10 was a low dose and is a commonly prescribed medication for his cardiovascular risk reduction and that benefit should outweigh risk. He understood but wanted to switch to alternative statin.  Called [place] pharmacy, discussed alternative statin options (pt has no insurance), selected Pravastatin 40mg  daily (moderate intensity statin), $13 for 30 day supply, would be cheaper if can do 90 day supply in future. Called patient back to  discuss this option, he was agreeable to starting Pravastatin 40mg . Will follow-up as scheduled within 1 month to assess if tolerating statin and focus on DM follow-up.  Saralyn PilarAlexander Karamalegos, DO [facility], PGY-2

## 2015-02-23 ENCOUNTER — Ambulatory Visit: Payer: Self-pay | Admitting: Family Medicine

## 2015-03-19 ENCOUNTER — Encounter: Payer: Self-pay | Admitting: Family Medicine

## 2015-03-19 ENCOUNTER — Ambulatory Visit (INDEPENDENT_AMBULATORY_CARE_PROVIDER_SITE_OTHER): Payer: Self-pay | Admitting: Family Medicine

## 2015-03-19 VITALS — BP 171/76 | HR 84 | Temp 98.1°F | Ht 70.0 in | Wt 168.8 lb

## 2015-03-19 DIAGNOSIS — E1165 Type 2 diabetes mellitus with hyperglycemia: Secondary | ICD-10-CM

## 2015-03-19 DIAGNOSIS — E1142 Type 2 diabetes mellitus with diabetic polyneuropathy: Secondary | ICD-10-CM

## 2015-03-19 DIAGNOSIS — Z720 Tobacco use: Secondary | ICD-10-CM

## 2015-03-19 DIAGNOSIS — IMO0002 Reserved for concepts with insufficient information to code with codable children: Secondary | ICD-10-CM

## 2015-03-19 DIAGNOSIS — I10 Essential (primary) hypertension: Secondary | ICD-10-CM

## 2015-03-19 DIAGNOSIS — E785 Hyperlipidemia, unspecified: Secondary | ICD-10-CM

## 2015-03-19 DIAGNOSIS — F17200 Nicotine dependence, unspecified, uncomplicated: Secondary | ICD-10-CM

## 2015-03-19 DIAGNOSIS — E119 Type 2 diabetes mellitus without complications: Secondary | ICD-10-CM

## 2015-03-19 LAB — POCT GLYCOSYLATED HEMOGLOBIN (HGB A1C): Hemoglobin A1C: 10

## 2015-03-19 MED ORDER — PRAVASTATIN SODIUM 40 MG PO TABS
40.0000 mg | ORAL_TABLET | Freq: Every day | ORAL | Status: DC
Start: 2015-03-19 — End: 2016-01-22

## 2015-03-19 NOTE — Assessment & Plan Note (Signed)
Elevated BP, previously well controlled. Likely due to only taking BP meds 10 min before apt Complication - CKD-III  Plan: 1. No change today - improved BP on re-check 2. Not due for labs - cont monitor Cr 3. Lifestyle mods - encouraged smoking cessation and inc regular exercise

## 2015-03-19 NOTE — Assessment & Plan Note (Signed)
HLD in setting of DM2, last direct LDL 106 off statin - Taking Pravastatin 40mg  daily x 1 month (mild myalgias, b/l upper ext, wrist, shoulders)  Plan: 1. Continue Pravastatin 40mg  daily for 3-6 months to see if myalgias improve. Sent refills 2. Consider LFTs, CK if worsening

## 2015-03-19 NOTE — Assessment & Plan Note (Addendum)
Improved control - A1c 10.0 (03/19/15) down from last A1c 11.3 (12/2014) Complicated by peripheral neuropathy, CKD, DM retinopathy - DM Foot exam normal today (03/19/15) - F/u with Ophtho for annual eye exam  Plan:  1. Continue current therapy with 70/30 insulin BID 25-30u AM / 15-17u PM - bring log / meter to next visit to make adjustments if needed 2. Continue Metformin 1000mg  BID 3. Lifestyle Mods - encouraged cont improved DM diet, strongly advised starting regular exercise 4. Continue Pravastatin for ASCVD risk reduction 5. RTC 3 month for DM f/u, A1c

## 2015-03-19 NOTE — Assessment & Plan Note (Signed)
Significantly improved, b/l hand / feet burning / intermittent numbness since starting Gabapentin qhs - unable to tolerate 300mg  qhs - Normal DM foot exam, intact monofilament  Plan: 1. Continue Gabapentin, titrate per results / reduced side-effects. Reduce qhs dose to 100mg , add AM 100mg  for few days then afternoon 100mg , for goal of 100mg  TID

## 2015-03-19 NOTE — Assessment & Plan Note (Signed)
Active smoker, intermittently skips few days, has not started NRT - Not ready to quit  Plan: 1. Smoking cessation counseling provided today

## 2015-03-19 NOTE — Patient Instructions (Addendum)
Dear Colin Keller, Thank you for coming in to clinic today. It was good to see you again!  1. For your Diabetes - A1c 11.3 down to 10.0 today - Congratulations. Continue current regimen with 70/30 insulin - no change today   - work on exercise regimen (walking) 2. For your BP - continue current meds 3. For your Cholesterol / Prevention - Continue Pravastatin 40mg  daily (sent refills into pharmacy) let's try for 3 to 6 months and re-evaluate 4. For neuropathy - Continue Gabapentin - try lower dose more frequently, 100mg  at night, then add 100mg  in morning, if tolerate few days add afternoon so 100mg  3 times daily  Keep working on your smoking, it sounds like you are making progress. Let me know if you need help.  Some important numbers from today's visit: BP - 171/76 >> RE-CHECKED at 158/70 Results -  Consider pneumonia vaccine, colonoscopy in future for screening  Please schedule a follow-up appointment with me in 3 month for Diabetes   If you have any other questions or concerns, please feel free to call the clinic to contact me. You may also schedule an earlier appointment if necessary.  However, if your symptoms get significantly worse, please go to the Emergency Department to seek immediate medical attention.  Colin PilarAlexander Alvey Brockel, DO [facility]

## 2015-03-19 NOTE — Progress Notes (Signed)
Patient ID: Colin KaufmanDavid W Laning, male   DOB: [birth year], 59 y.o.   MRN: [id number]   Subjective:   HPI  CHRONIC DM, Type 2: - Reports that he believes his sugar is better controlled over last 1 month. Did not bring meter, forgot it at home.   CBGs: Avg 150-180, Low 57 (+symptoms), High >300. Checks CBGs 2x daily (AM fasting, prior to dinner or qhs) Meds: 70/30 25-30u in AM / 15-17u PM (>200 higher dose). Metformin 1000mg  BID Reports good compliance. Tolerating well w/o side-effects Currently on ACEi Lifestyle: Diet (trying to improve DM diet, dec carbs, inc water and low calorie drinks) / Exercise (remains active without structured walking) Denies hypoglycemia, polyuria, visual changes, numbness or tingling.  DYSLIPIDEMIA: - History of prior dyslipidemia in setting of DM2 - last lipid panel 07/2014, previously on simvastatin 40 to 80mg  (however no longer taking this due to history of prior myalgias). Last Direct LDL 02/2015 at 106. 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 Wants to continue at this time  CHRONIC HTN: Reports no concerns Current Meds - Lisinopril-HCTZ 20-12.5mg  daily, Metoprolol 25mg  BID (half tab 50) Reports good compliance, took meds today (although 10 minutes before arrival). Tolerating well, w/o complaints. Denies CP, dyspnea, HA, edema, dizziness / lightheadedness  H/O TOBACCO ABUSE - Quit 09/2014, previously resumed. Currently smoking < 5 cigs daily, has not started OTC NRT (patches, gum) - Now intermittently smoking, 1 pack stretches over 2-3 days, then will go few days without smoking - Wants  to quit, not ready yet  CHRONIC PAIN SYNDROME / PERIPHERAL NEUROPATHY: - Stated that he gradually titrated up on Gabapentin up to 300mg  QHS, initially on 100 to 200mg  helped significantly reduced pain and allowed him to sleep well  However, once he got up 300mg  he was "jittery, wired" and "difficult going to sleep". Significantly reduced burning and numbness in hands and feet, even good results with 100mg  qhs. - Admits intermittent shooting pains with tingling and numbness down Left leg - Denies any focal weakness, tingling, or weakness in either lower ext, no saddle anesthesia, urinary incontinence or retention  I have reviewed and updated the following as appropriate: allergies and current medications  Social Hx: - Active smoker  Review of Systems  See above HPI    Objective:   Physical Exam  BP 171/76 mmHg  Pulse 84  Temp(Src) 98.1 F (36.7 C) (Oral)  Ht 5\' 10"  (1.778 m)  Wt 168 lb 12.8 oz (76.567 kg)  BMI 24.22 kg/m2  Repeat BP manual (Left arm) - 158/70  Gen - chronically ill-appearing, cooperative, NAD HEENT - MMM Heart - RRR, no murmurs heard Lungs - CTAB, no wheezing, no focal crackles or rhonchi. Normal work of breathing. Ext - non-tender, no edema, peripheral pulses intact +2 b/l Skin - warm, dry, feet normal without ulcers or calluses Neuro - awake, alert, grossly non-focal, intact distal sensation to light touch, foot exam intact to monofilament testing, gait normal     Assessment & Plan:   See specific A&P problem list for details.

## 2015-04-06 ENCOUNTER — Other Ambulatory Visit: Payer: Self-pay | Admitting: *Deleted

## 2015-04-06 DIAGNOSIS — O1002 Pre-existing essential hypertension complicating childbirth: Secondary | ICD-10-CM

## 2015-04-06 MED ORDER — METOPROLOL TARTRATE 50 MG PO TABS: 25.0000 mg | ORAL_TABLET | Freq: Two times a day (BID) | ORAL | Status: DC

## 2015-04-13 ENCOUNTER — Other Ambulatory Visit: Payer: Self-pay | Admitting: *Deleted

## 2015-04-13 DIAGNOSIS — G8929 Other chronic pain: Secondary | ICD-10-CM

## 2015-04-13 DIAGNOSIS — M549 Dorsalgia, unspecified: Principal | ICD-10-CM

## 2015-04-14 MED ORDER — CYCLOBENZAPRINE HCL 10 MG PO TABS: 10.0000 mg | ORAL_TABLET | Freq: Three times a day (TID) | ORAL | Status: DC | PRN

## 2015-04-23 LAB — HM DIABETES EYE EXAM

## 2015-05-06 ENCOUNTER — Other Ambulatory Visit: Payer: Self-pay | Admitting: Family Medicine

## 2015-05-06 DIAGNOSIS — M545 Low back pain, unspecified: Secondary | ICD-10-CM

## 2015-05-06 NOTE — Telephone Encounter (Signed)
Colin Keller will be out of his hydrocodone before his appt on 6/9.  Call when ready to pick up.  Will not fill until time to do so.

## 2015-05-08 MED ORDER — HYDROCODONE-ACETAMINOPHEN 10-325 MG PO TABS: 1.0000 | ORAL_TABLET | Freq: Three times a day (TID) | ORAL | Status: DC | PRN

## 2015-05-08 NOTE — Telephone Encounter (Signed)
Reviewed chart. Previously given 3 month supply norco on 02/16/15, pt scheduled for next 3 month follow-up on 05/21/15, will run out of norco soon. Printed new Norco rx 10/325mg  take 1 PO q 8 hr PRN #90, 0 refills printed and signed for 1 month supply to last until apt on 05/21/15. At that time, will review chronic pain and anticipate printing additional x 2 to 3 month rx.  [facility] [employer]. Please notify patient that he may pick this up at anytime after today Friday 05/08/15.  Saralyn PilarAlexander Berdina Cheever, DO [facility], PGY-2

## 2015-05-08 NOTE — Telephone Encounter (Signed)
LM with wife [name] ok per signed DPR in chart.  She will inform patient so he can come by and pick up. Jazmin Hartsell,CMA

## 2015-05-21 ENCOUNTER — Encounter: Payer: Self-pay | Admitting: Family Medicine

## 2015-05-21 ENCOUNTER — Ambulatory Visit (INDEPENDENT_AMBULATORY_CARE_PROVIDER_SITE_OTHER): Payer: Self-pay | Admitting: Family Medicine

## 2015-05-21 VITALS — BP 100/54 | HR 66 | Temp 98.8°F | Ht 70.0 in | Wt 166.0 lb

## 2015-05-21 DIAGNOSIS — G8929 Other chronic pain: Secondary | ICD-10-CM

## 2015-05-21 DIAGNOSIS — I10 Essential (primary) hypertension: Secondary | ICD-10-CM

## 2015-05-21 DIAGNOSIS — Z720 Tobacco use: Secondary | ICD-10-CM

## 2015-05-21 DIAGNOSIS — IMO0002 Reserved for concepts with insufficient information to code with codable children: Secondary | ICD-10-CM

## 2015-05-21 DIAGNOSIS — M545 Low back pain, unspecified: Secondary | ICD-10-CM

## 2015-05-21 DIAGNOSIS — E1142 Type 2 diabetes mellitus with diabetic polyneuropathy: Secondary | ICD-10-CM

## 2015-05-21 DIAGNOSIS — E119 Type 2 diabetes mellitus without complications: Secondary | ICD-10-CM

## 2015-05-21 DIAGNOSIS — E1165 Type 2 diabetes mellitus with hyperglycemia: Secondary | ICD-10-CM

## 2015-05-21 DIAGNOSIS — E785 Hyperlipidemia, unspecified: Secondary | ICD-10-CM

## 2015-05-21 DIAGNOSIS — M549 Dorsalgia, unspecified: Secondary | ICD-10-CM

## 2015-05-21 DIAGNOSIS — F17200 Nicotine dependence, unspecified, uncomplicated: Secondary | ICD-10-CM

## 2015-05-21 LAB — POCT GLYCOSYLATED HEMOGLOBIN (HGB A1C): HEMOGLOBIN A1C: 10.1

## 2015-05-21 MED ORDER — HYDROCODONE-ACETAMINOPHEN 10-325 MG PO TABS: 1.0000 | ORAL_TABLET | Freq: Three times a day (TID) | ORAL | Status: DC | PRN

## 2015-05-21 NOTE — Patient Instructions (Signed)
Dear Colin Keller, Thank you for coming in to clinic today. It was good to see you again!  1. For your Diabetes - A1c stable at 10.1 today - Keep up good work and results will come. - Remember to have a more structured schedule eating / insulin, same time every day 2. For your Cholesterol / Prevention - Try half tab Pravastatin every night for next 1 to 2 weeks, if tolerating continue. Otherwise, scale back to half tab twice a week (Mon and Thurs), if still not tolerating, we will switch to different medicine at next visit. 3. For neuropathy - Continue Gabapentin  Keep working on your smoking. Let me know if you need help.  Some important numbers from today's visit: BP - 100/54  Consider colonoscopy in future for screening once you get [employer] or any coverage. Schedule apt to meet with Britta Mccreedy for Financial Assistance  Please schedule a follow-up appointment with me in 3 month for Diabetes   If you have any other questions or concerns, please feel free to call the clinic to contact me. You may also schedule an earlier appointment if necessary.  However, if your symptoms get significantly worse, please go to the Emergency Department to seek immediate medical attention.  Saralyn Pilar, DO [facility]

## 2015-05-21 NOTE — Progress Notes (Signed)
Patient ID: Colin Keller, male   DOB: Dec 11, 1956, 59 y.o.   MRN: [id number]   Subjective:   HPI  CHRONIC DM, Type 2: - < 3 months since last visit. Reports that he thinks his sugars may be improved. Has meter with readings, familiar with readings but did not write down separate log - Can't take more than 25u in AM, states he feels bad within 2 hours, he feels bad with "jitteries, decreased vision" CBGs: Avg - 250, Low 49 to 60s multiple times (+symptoms), High >600 x1 episode (? If insulin syringe worked properly) Meds: 70/30 20-25u in AM / 15-17u PM, Metformin 1000mg  BID Reports good compliance. Tolerating well Currently on ACEi - Dr. Duke Salvia [facility]) saw her 2 weeks ago, no DM retinopathy Lifestyle: Diet (trying to improve DM diet, no white bread, now taking lunch daily, no hamburgers, avoiding starches biscuits and no white rice, very few potatoes, inc water and low calorie drinks) / Exercise (remains active without structured walking) Denies hypoglycemia, polyuria, visual changes, numbness or tingling.  DYSLIPIDEMIA / ASCVD Risk in DM2: - History of prior dyslipidemia in setting of DM2 - last lipid panel 07/2014. Last direct LDL 02/2015 (106) Statin therapy - Failed simvastatin 40 to 80mg  due to myalgias, unclear duration of therapy. Previously offered Lipitor but declined due to myalgia concern. - Previously agreed on starting Pravastatin 40mg  daily, however he admits taking this qhs x 2-3 weeks, then started to develop nausea without vomiting right after taking it. Stopped it and then felt much better immediately. - Denies any myalgias while on pravastatin and none currently  CHRONIC HTN: Reports no concerns Current Meds - Lisinopril-HCTZ 20-12.5mg  daily, Metoprolol 25mg  BID (half tab 50) Reports good compliance, took meds today. Tolerating well, w/o complaints. Denies CP, dyspnea, HA, edema, dizziness / lightheadedness  H/O TOBACCO ABUSE - Quit 09/2014,  previously resumed. - Increased to half pack daily (was < 6 cigs daily), not started any NRT - Wants to quit, not ready  CHRONIC PAIN SYNDROME / PERIPHERAL NEUROPATHY: - H/o chronic LBP with h/o lumbar disc herniation and intermittent radicular pain - Last [facility] chronic pain management visit 02/16/15, given 3 month supply rx, Norco 10/325 #90 monthly - Tolerating gabapentin 100mg  BID to TID, reducing radicular pains - Continues to take Norco 10/325 as prescribed up to TID - No concerns or worsening symptoms today - Admits stable to improved intermittent shooting pains with tingling and numbness down Left leg - Denies any focal weakness, tingling, or weakness in either lower ext, no saddle anesthesia, urinary incontinence or retention  I have reviewed and updated the following as appropriate: allergies and current medications  Social Hx: - Active smoker  Review of Systems  See above HPI    Objective:   Physical Exam  BP 100/54 mmHg  Pulse 66  Temp(Src) 98.8 F (37.1 C) (Oral)  Ht 5\' 10"  (1.778 m)  Wt 166 lb (75.297 kg)  BMI 23.82 kg/m2  Gen - chronically ill-appearing, cooperative, NAD HEENT - MMM Heart - RRR, no murmurs heard Lungs - CTAB, no wheezing, no focal crackles or rhonchi. Normal work of breathing. MSK - Back: no deformities, non-tender to palpation over spine, mild bilateral LBP paraspinal muscle tenderness, seated SLR negative Ext - non-tender, no edema, peripheral pulses intact +2 b/l Skin - warm, dry Neuro - awake, alert, grossly non-focal, intact distal sensation to light touch, gait normal     Assessment & Plan:   See specific A&P problem list for details.

## 2015-05-23 NOTE — Assessment & Plan Note (Signed)
Improved, low-normal BP today. Compliant with meds Complication - CKD-III  Plan: 1. Continue HCTZ-Lisinopril 20-12.5mg  daily, Metoprolol 25 BID (half tab 50mg ) 2. Monitor

## 2015-05-23 NOTE — Assessment & Plan Note (Signed)
Increased smoking, up to half ppd now, not started NRT - Smoking cessation counseling offered. Declined any therapy, wants to quit but not ready

## 2015-05-23 NOTE — Assessment & Plan Note (Signed)
Stable control - A1c 10.1 (03/19/15) vs last A1c 10.0 (03/19/15) Complicated by peripheral neuropathy, CKD, DM retinopathy - Last Ophtho 2 weeks ago, no reported DM, request records  Plan:  1. Continue current therapy with 70/30 insulin BID 25-30u AM / 15-17u PM. Seems labile CBGs with several 60s fasting in AM, seems to be eating breakfast / taking first 70/30 insulin at variable times, sometimes waiting later, needs to improve set schedule with eating / insulin use 2. Continue Metformin 1000mg  BID 3. Lifestyle Mods - encouraged cont improved DM diet, again strongly advised starting regular exercise (in addition to regular work) 4. Reduced Pravastatin to 20mg  daily (half of 40mg  tab) qhs x up to 2 week trial, see if nausea / myalgias. If intolerant, advised to switch to 2x weekly for up to 2 weeks 5. RTC 3 month for A1c

## 2015-05-23 NOTE — Assessment & Plan Note (Signed)
Intolerance now to Pravastatin 40mg  with nausea, resolved after stopped. - Recommended statin mod-high dose therapy for elevated ASCVD risk in DM2 - Failed simvastatin d/t myalgias, declined lipitor  Plan: 1. Reduce dose to 20mg  (half 40mg  tab), qhs x up to 2 weeks trial 2. If still intolerant or myalgias, can reduce to 20mg  twice weekly and titrate up vs trial on alternative statin twice weekly and titrate up

## 2015-05-23 NOTE — Assessment & Plan Note (Signed)
Stable, improved  Plan: 1. Continue Gabapentin 100mg  up to TID as tolerated

## 2015-05-23 NOTE — Assessment & Plan Note (Signed)
Stable chronic LBP. No flare, no red flags. Stable on Norco, seems to be taking appropriately  Plan: 1. Refilled Norco 10/325 q 8 hr PRN (#90, 0 refills) printed x 3 refills with appropriate monthly do not fill dates 2. Continue Gabapentin 100mg  up to TID as tolerated 3. Consider future referral to ortho for possible lumbar epidural injections if leg pain worsens 4. RTC 3 months for refills

## 2015-05-25 ENCOUNTER — Other Ambulatory Visit: Payer: Self-pay | Admitting: *Deleted

## 2015-05-25 DIAGNOSIS — IMO0002 Reserved for concepts with insufficient information to code with codable children: Secondary | ICD-10-CM

## 2015-05-25 DIAGNOSIS — E1165 Type 2 diabetes mellitus with hyperglycemia: Secondary | ICD-10-CM

## 2015-05-25 MED ORDER — METFORMIN HCL 1000 MG PO TABS: 1000.0000 mg | ORAL_TABLET | Freq: Two times a day (BID) | ORAL | Status: DC

## 2015-05-28 ENCOUNTER — Telehealth: Payer: Self-pay | Admitting: Family Medicine

## 2015-05-28 NOTE — Telephone Encounter (Signed)
Pt was here last week and since then cannot find his glucose meter, wants to know if he may have left it in the exam room? Checked with front desk and nothing has been turned in.

## 2015-05-28 NOTE — Telephone Encounter (Signed)
Patient informed that nothing has been turned in.

## 2015-07-06 ENCOUNTER — Other Ambulatory Visit: Payer: Self-pay | Admitting: Family Medicine

## 2015-07-06 DIAGNOSIS — E1142 Type 2 diabetes mellitus with diabetic polyneuropathy: Secondary | ICD-10-CM

## 2015-08-03 ENCOUNTER — Other Ambulatory Visit: Payer: Self-pay | Admitting: Family Medicine

## 2015-08-03 DIAGNOSIS — M545 Low back pain, unspecified: Secondary | ICD-10-CM

## 2015-08-03 MED ORDER — HYDROCODONE-ACETAMINOPHEN 10-325 MG PO TABS
1.0000 | ORAL_TABLET | Freq: Three times a day (TID) | ORAL | Status: DC | PRN
Start: 2015-08-03 — End: 2015-08-12

## 2015-08-03 NOTE — Telephone Encounter (Signed)
Reviewed chart. Last OV for chronic follow-up in 05/2015. Patient would be nearly due for refill on chronic pain medicine Norco. Upcoming scheduled OV apt with me on 08/12/15. Will print 1 month supply refill for patient at this time, and provide additional refills for total 3 month supply at next visit as scheduled.  Norco 10/325mg  take 1 tab q 8 hr PRN pain, #90, 0 refills for 1 month supply. Printed, signed, and left up in envelope to picked up at [facility], ready for patient's wife to pick up on Wednesday by wife.  Saralyn Pilar, DO [facility], PGY-3

## 2015-08-03 NOTE — Telephone Encounter (Signed)
Pt wife calling and states that he will be out of hydrocodone by end of this week and has a f/u appt next week. She states that if this could be ready by Wednesday she can pick it up then while she is here for her own appt that way she doesn't have to make two trips. Sadie Reynolds, ASA

## 2015-08-04 NOTE — Telephone Encounter (Signed)
Patient wife informed, expressed understanding. 

## 2015-08-12 ENCOUNTER — Ambulatory Visit (INDEPENDENT_AMBULATORY_CARE_PROVIDER_SITE_OTHER): Payer: Self-pay | Admitting: Family Medicine

## 2015-08-12 ENCOUNTER — Encounter: Payer: Self-pay | Admitting: Family Medicine

## 2015-08-12 VITALS — BP 148/70 | HR 61 | Temp 97.9°F | Ht 70.0 in | Wt 164.7 lb

## 2015-08-12 DIAGNOSIS — IMO0002 Reserved for concepts with insufficient information to code with codable children: Secondary | ICD-10-CM

## 2015-08-12 DIAGNOSIS — M545 Low back pain, unspecified: Secondary | ICD-10-CM

## 2015-08-12 DIAGNOSIS — I10 Essential (primary) hypertension: Secondary | ICD-10-CM

## 2015-08-12 DIAGNOSIS — N183 Chronic kidney disease, stage 3 unspecified: Secondary | ICD-10-CM

## 2015-08-12 DIAGNOSIS — E1165 Type 2 diabetes mellitus with hyperglycemia: Secondary | ICD-10-CM

## 2015-08-12 DIAGNOSIS — E785 Hyperlipidemia, unspecified: Secondary | ICD-10-CM

## 2015-08-12 DIAGNOSIS — Z7189 Other specified counseling: Secondary | ICD-10-CM

## 2015-08-12 DIAGNOSIS — E119 Type 2 diabetes mellitus without complications: Secondary | ICD-10-CM

## 2015-08-12 DIAGNOSIS — G8929 Other chronic pain: Secondary | ICD-10-CM

## 2015-08-12 LAB — BASIC METABOLIC PANEL WITH GFR
BUN: 30 mg/dL — ABNORMAL HIGH (ref 7–25)
CALCIUM: 9.9 mg/dL (ref 8.6–10.3)
CO2: 25 mmol/L (ref 20–31)
Chloride: 102 mmol/L (ref 98–110)
Creat: 1.25 mg/dL (ref 0.70–1.25)
GFR, EST AFRICAN AMERICAN: 71 mL/min (ref >=60)
GFR, EST NON AFRICAN AMERICAN: 62 mL/min (ref >=60)
GLUCOSE: 203 mg/dL — AB (ref 65–99)
POTASSIUM: 4.9 mmol/L (ref 3.5–5.3)
SODIUM: 138 mmol/L (ref 135–146)

## 2015-08-12 LAB — POCT GLYCOSYLATED HEMOGLOBIN (HGB A1C): Hemoglobin A1C: 10.3

## 2015-08-12 MED ORDER — HYDROCODONE-ACETAMINOPHEN 10-325 MG PO TABS: 1.0000 | ORAL_TABLET | Freq: Three times a day (TID) | ORAL | Status: DC | PRN

## 2015-08-12 NOTE — Assessment & Plan Note (Signed)
Continue Norco for chronic pain

## 2015-08-12 NOTE — Patient Instructions (Signed)
Dear Colin Keller, Thank you for coming in to clinic today. It was good to see you!  1. For your Diabetes - A1c 10.3 (from 10.1) slightly elevated but overall considered stable. Keep working hard to improve your diet. Call Dr. Gerilyn Pilgrim to schedule a Nutritionist appointment to review your diet in detail. - Continue staying active and exercising - Continue current medicine regimen, discuss with Dr. Raymondo Band possible change. Please write down on a calendar all of your blood sugar readings for next 2-3 weeks to bring to his appointment 2. Repeat blood work today for kidneys 3. Refilled Norco today for 2 more months  Some important numbers from today's visit: BP 148/70 mmHg  Pulse 61  Temp(Src) 97.9 F (36.6 C) (Oral)  Ht  (1.778 m)  Wt 164 lb 11.2 oz (74.707 kg)  BMI 23.63 kg/m2 Results -  Please schedule a follow-up appointment with Dr. Raymondo Band (Pharmacy Clinic - Diabetes) in 2 to 4 weeks for follow-up Insulin Management  Follow-up with Dr. Gerilyn Pilgrim - call number on card  Follow-up with Dr. Althea Charon in 3 months (Diabetes, Chronic pain)  If you have any other questions or concerns, please feel free to call the clinic to contact me. You may also schedule an earlier appointment if necessary.  However, if your symptoms get significantly worse, please go to the Emergency Department to seek immediate medical attention.  Saralyn Pilar, DO [facility]

## 2015-08-12 NOTE — Assessment & Plan Note (Addendum)
Check BMET today, last SCr 1.42 (07/2014, at likely new baseline) - Continue improve control HTN, DM2  UPDATE 08/13/15 BMET with improved to stable SCr to 1.25 (GFR 62) seems to be at improved baseline, still borderline/considered CKD-III. Reviewed results with patient.

## 2015-08-12 NOTE — Assessment & Plan Note (Addendum)
Remains uncontrolled to slightly worsening control with labile blood sugars ranging 50-60s up to >500, avg 230s, likely with poor dietary choices and habits, frequently waiting >5 hr apart from some meals Current HgbA1c 10.3 (8/31), last HgbA1c 10.1 (05/21/15). Complications - peripheral neuropathy, CKD-III, no DM retinopathy  Plan:  1. Continue current therapy with 70/30 insulin BID dosing 15-20u AM / 20-25u PM, advised pt to titrate up by 1 unit every 3 days if fasting CBG >200. Previously advised he cant tolerate dosing >25u. 2. Lifestyle Mods - Significantly needs to improve DM diet (dec carbs, inc vegs), regular smaller frequent meals, avoid waiting >5 hr without eating, inc protein foods, No potatoes, may need to start regular exercise in addition to work 3. Referral to Dr. Gerilyn Pilgrim - DM nutrition 4. Referral to Dr. Raymondo Band for DM management, 2-4 weeks, limited options due to no ins, cost, and labile CBGs on 70/30 dosing 5. RTC 3 mo for A1c

## 2015-08-12 NOTE — Assessment & Plan Note (Signed)
Tolerating Pravastatin  (half tab) daily without myalgias  Plan: 1. Consider titrating up to  BID, if unsuccessful can continue  daily or future trial  2-3x weekly

## 2015-08-12 NOTE — Progress Notes (Signed)
Subjective:    Patient ID: Colin Keller, male    DOB: [birth year], 59 y.o.   MRN: [id number]  Colin Keller is a 59 y.o. male presenting on 08/12/2015 for Follow-up   HPI  CHRONIC DM, Type 2: - Reports overall has blood sugars that "go up and down", difficult to predict per patient, however he is trying to improve his DM diet, but does go >5 hours at times without eating, has weight loss from 168 lb in 03/2015 to 164 in 8/31 today. - Switched his insulin dosing, unable to titrate up above 25u, now taking less in AM and more PM CBGs: Avg - 230, this AM fasting 157, Low 57-60s (2-3x in past 3 months), 300s (did have one 541 PM 2 hr postprandial, but now decided to take PM insulin dose sooner) Meds: 70/30 15u in AM / 20u PM, Metformin 1000mg  BID Reports good compliance. Tolerating well Currently on ACEi - Already seen Ophtho this year, no DM retinopathy Lifestyle: Diet (trying to improve DM diet, no white bread, now taking lunch daily, no hamburgers, avoiding starches biscuits and no white rice, very few potatoes, inc water and low calorie drinks) / Exercise (remains active at work without structured walking) Denies hypoglycemia, polyuria, visual changes, numbness or tingling.  DYSLIPIDEMIA / ASCVD Risk in DM2: - History of prior dyslipidemia in setting of DM2 - last lipid panel 07/2014. Last direct LDL 02/2015 (106) - Currently taking half of pravastin 40mg  (total 20mg ) daily in AM with other meds, tolerating well without myalgias. Considering taking additional half pill in evening. - Previously failed simvastatin, did not try atorvastatin  CHRONIC HTN: Reports no concerns Current Meds - Lisinopril-HCTZ 20-12.5mg  daily, Metoprolol 25mg  BID (half tab 50) Reports good compliance, took meds today. Tolerating well, w/o complaints. Denies CP, dyspnea, HA, edema, dizziness / lightheadedness  H/O TOBACCO ABUSE - Quit 09/2014, previously resumed. - Continues to smoke half pack daily - Wants to  quit, not ready  CHRONIC PAIN SYNDROME / PERIPHERAL NEUROPATHY: - H/o chronic LBP with h/o lumbar disc herniation and intermittent radicular pain, additionally with chronic bilateral shoulder pain, evaluated within past 1 year, likely bursitis, pain medicine is not helping shoulder pain - Last [facility] chronic pain management visit 05/21/15, given 3 month supply rx, Norco 10/325 #90 monthly, additionally he was given #90 pill refill on 08/03/15 (picked up by wife) due to nearly running out, understands he is to receive 2 refills today for total 3 month supply - Tolerating gabapentin 200mg  qhs (unable to tolerate 300mg ), reducing radicular pains - Continues to take Norco 10/325 as prescribed up to TID, takes 2-3 daily - No concerns or worsening symptoms today - Admits prior history with intermittent shooting pains down left leg, seems improved - Denies any focal weakness, tingling, or weakness in either lower ext, no saddle anesthesia, urinary incontinence or retention  Past Medical History  Diagnosis Date  . Diabetes mellitus   . GERD (gastroesophageal reflux disease)   . Hypertension   . Hyperlipidemia   . Tobacco abuse     Social History   Social History  . Marital Status: Married    Spouse Name: N/A  . Number of Children: N/A  . Years of Education: N/A   Occupational History  . Not on file.   Social History Main Topics  . Smoking status: Current Every Day Smoker -- 0.20 packs/day    Types: Cigarettes    Last Attempt to Quit: 09/15/2014  . Smokeless tobacco: Never  Used  . Alcohol Use: Not on file  . Drug Use: No  . Sexual Activity: Yes   Other Topics Concern  . 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 He and his wife were both laid off during recession. Wife Lawson Fiscal is also [facility] patient. Grown children. 1ppd smoker. Previously used marijuana -quit 10/03          Current Outpatient Prescriptions on File Prior to Visit  Medication Sig  . aspirin  81 MG tablet Take 81 mg by mouth daily.  . cyclobenzaprine (FLEXERIL) 10 MG tablet Take 1 tablet (10 mg total) by mouth 3 (three) times daily as needed for muscle spasms.  Marland Kitchen gabapentin (NEURONTIN) 100 MG capsule TAKE 1 CAPSULE BY MOUTH 3 TIMES DAILY  . Insulin Aspart Prot & Aspart (NOVOLOG 70/30 MIX) (70-30) 100 UNIT/ML Pen 25 units q AM and 17 units QHS (Patient taking differently: 15 units q AM and 20units QHS)  . lisinopril-hydrochlorothiazide (ZESTORETIC) 20-12.5 MG per tablet Take 1 tablet by mouth daily.  . metFORMIN (GLUCOPHAGE) 1000 MG tablet Take 1 tablet (1,000 mg total) by mouth 2 (two) times daily.  . metoprolol (LOPRESSOR) 50 MG tablet Take 0.5 tablets (25 mg total) by mouth 2 (two) times daily.  . pravastatin (PRAVACHOL) 40 MG tablet Take 1 tablet (40 mg total) by mouth daily.   No current facility-administered medications on file prior to visit.    Review of Systems  Constitutional: Negative for fever, chills, diaphoresis, activity change, appetite change and fatigue.  HENT: Negative for congestion and hearing loss.   Eyes: Negative for visual disturbance.  Respiratory: Negative for cough, chest tightness, shortness of breath and wheezing.   Cardiovascular: Negative for chest pain, palpitations and leg swelling.  Gastrointestinal: Negative for nausea, vomiting, abdominal pain, diarrhea and constipation.  Genitourinary: Negative for dysuria, frequency and hematuria.  Musculoskeletal: Positive for back pain (stable today) and arthralgias (bilateral shoulder pain and stiffness). Negative for neck pain.  Skin: Negative for rash.  Neurological: Positive for numbness (intermittent sometimes down left arm and some tingling down left leg with back pain). Negative for dizziness, weakness, light-headedness and headaches.  Hematological: Negative for adenopathy.  Psychiatric/Behavioral: Negative for behavioral problems and confusion.   Per HPI unless specifically indicated above       Objective:    BP 148/70 mmHg  Pulse 61  Temp(Src) 97.9 F (36.6 C) (Oral)  Ht 5\' 10"  (1.778 m)  Wt 164 lb 11.2 oz (74.707 kg)  BMI 23.63 kg/m2  Wt Readings from Last 3 Encounters:  08/12/15 164 lb 11.2 oz (74.707 kg)  05/21/15 166 lb (75.297 kg)  03/19/15 168 lb 12.8 oz (76.567 kg)    Physical Exam  Constitutional: He is oriented to person, place, and time. He appears well-developed and well-nourished. No distress.  HENT:  Head: Normocephalic and atraumatic.  Mouth/Throat: Oropharynx is clear and moist.  Eyes: Conjunctivae and EOM are normal. Pupils are equal, round, and reactive to light.  Neck: Normal range of motion. Neck supple. No thyromegaly present.  Cardiovascular: Normal rate, regular rhythm, normal heart sounds and intact distal pulses.   No murmur heard. Pulmonary/Chest: Effort normal and breath sounds normal. No respiratory distress. He has no wheezes. He has no rales.  Musculoskeletal: Normal range of motion. He exhibits no edema or tenderness.  Muscle str normal 5/5 biceps, grip, knee flex, ankle dorsiflex. Bilateral shoulders with mild reduced ROM above shoulder R>L, impingement testing mildly positive, rotator cuff  str intact  Lymphadenopathy:    He has no cervical adenopathy.  Neurological: He is alert and oriented to person, place, and time.  Skin: Skin is warm and dry. No rash noted. He is not diaphoretic.  Psychiatric: He has a normal mood and affect. His behavior is normal.  Nursing note and vitals reviewed.  Results for orders placed or performed in visit on 08/12/15  HgB A1c  Result Value Ref Range   Hemoglobin A1C 10.3       Assessment & Plan:   Problem List Items Addressed This Visit      Cardiovascular and Mediastinum   HYPERTENSION, BENIGN    Stable, mildly elevated SBP today. Compliant with meds Complication - CKD-III  Plan: 1. Continue HCTZ-Lisinopril 20-12.5mg  daily, Metoprolol 25 BID (half tab 50mg ) 2. Check BMET, for SCr GFR, last  SCr 1.42 07/2014 3. RTC 3 months      Relevant Orders   BASIC METABOLIC PANEL WITH GFR     Genitourinary   CKD (chronic kidney disease) stage 3, GFR 30-59 ml/min    Check BMET today, last SCr 1.42 (07/2014, at likely new baseline) - Continue improve control HTN, DM2      Relevant Orders   BASIC METABOLIC PANEL WITH GFR     Other   BACK PAIN, LUMBAR (Chronic)    Stable chronic LBP. No flare, no red flags. Controlled on Norco  Plan: 1. Refilled Norco 10/325 q 8 hr PRN (#90, 0 refills) printed x 2 refills with appropriate monthly do not fill dates (9/15, 09/26/2015), was given 1 rx printed #90 on 8/22 picked up by wife to allow pt to make to apt today 2. Continue Gabapentin 200mg  qhs as tolerated 3. Consider future referral to ortho for possible lumbar epidural injections if leg pain worsens 4. RTC 3 months for refills      Relevant Medications   HYDROcodone-acetaminophen (NORCO) 10-325 MG per tablet   DM (diabetes mellitus), type 2, uncontrolled    Remains uncontrolled to slightly worsening control with labile blood sugars ranging 50-60s up to >500, avg 230s, likely with poor dietary choices and habits, frequently waiting >5 hr apart from some meals Current HgbA1c 10.3 (8/31), last HgbA1c 10.1 (05/21/15). Complications - peripheral neuropathy, CKD-III, no DM retinopathy  Plan:  1. Continue current therapy with 70/30 insulin BID dosing 15-20u AM / 20-25u PM, advised pt to titrate up by 1 unit every 3 days if fasting CBG >200. Previously advised he cant tolerate dosing >25u. 2. Lifestyle Mods - Significantly needs to improve DM diet (dec carbs, inc vegs), regular smaller frequent meals, avoid waiting >5 hr without eating, inc protein foods, No potatoes, may need to start regular exercise in addition to work 3. Referral to Dr. Gerilyn Pilgrim - DM nutrition 4. Referral to Dr. Raymondo Band for DM management, 2-4 weeks, limited options due to no ins, cost, and labile CBGs on 70/30 dosing 5. RTC 3 mo for  A1c      Encounter for chronic pain management    Continue Norco for chronic pain      Hyperlipidemia    Tolerating Pravastatin 20mg  (half tab) daily without myalgias  Plan: 1. Consider titrating up to 20mg  BID, if unsuccessful can continue 20mg  daily or future trial 40mg  2-3x weekly       Other Visit Diagnoses    Type 2 diabetes mellitus without complication    -  Primary    Relevant Orders    HgB A1c (Completed)    Amb  ref to Medical Nutrition Therapy-MNT    BASIC METABOLIC PANEL WITH GFR       Meds ordered this encounter  Medications  . ranitidine (ZANTAC) 75 MG tablet    Sig: Take 75 mg by mouth daily.  Marland Kitchen DISCONTD: HYDROcodone-acetaminophen (NORCO) 10-325 MG per tablet    Sig: Take 1 tablet by mouth every 8 (eight) hours as needed for moderate pain or severe pain.    Dispense:  90 tablet    Refill:  0    Do not fill before 08/27/15  . DISCONTD: HYDROcodone-acetaminophen (NORCO) 10-325 MG per tablet    Sig: Take 1 tablet by mouth every 8 (eight) hours as needed for moderate pain or severe pain.    Dispense:  90 tablet    Refill:  0    Do not fill before 08/27/15  . HYDROcodone-acetaminophen (NORCO) 10-325 MG per tablet    Sig: Take 1 tablet by mouth every 8 (eight) hours as needed for moderate pain or severe pain.    Dispense:  90 tablet    Refill:  0    Do not fill before 09/26/15      Follow up plan: Return in about 3 months (around 11/11/2015) for diabetes, blood pressure.  Saralyn Pilar, DO [facility], PGY-3

## 2015-08-12 NOTE — Assessment & Plan Note (Signed)
Stable chronic LBP. No flare, no red flags. Controlled on Norco  Plan: 1. Refilled Norco 10/325 q 8 hr PRN (#90, 0 refills) printed x 2 refills with appropriate monthly do not fill dates (9/15, 09/26/2015), was given 1 rx printed #90 on 8/22 picked up by wife to allow pt to make to apt today 2. Continue Gabapentin  qhs as tolerated 3. Consider future referral to ortho for possible lumbar epidural injections if leg pain worsens 4. RTC 3 months for refills

## 2015-08-12 NOTE — Assessment & Plan Note (Signed)
Stable, mildly elevated SBP today. Compliant with meds Complication - CKD-III  Plan: 1. Continue HCTZ-Lisinopril 20-12.5mg  daily, Metoprolol 25 BID (half tab ) 2. Check BMET, for SCr GFR, last SCr 1.42 07/2014 3. RTC 3 months

## 2015-09-28 ENCOUNTER — Other Ambulatory Visit: Payer: Self-pay | Admitting: Family Medicine

## 2015-09-28 DIAGNOSIS — M545 Low back pain, unspecified: Secondary | ICD-10-CM

## 2015-09-28 DIAGNOSIS — M25511 Pain in right shoulder: Secondary | ICD-10-CM

## 2015-09-28 DIAGNOSIS — G8929 Other chronic pain: Secondary | ICD-10-CM

## 2015-10-23 ENCOUNTER — Encounter: Payer: Self-pay | Admitting: Family Medicine

## 2015-10-23 ENCOUNTER — Ambulatory Visit (INDEPENDENT_AMBULATORY_CARE_PROVIDER_SITE_OTHER): Payer: Self-pay | Admitting: Family Medicine

## 2015-10-23 VITALS — Temp 98.3°F | Ht 70.0 in | Wt 160.7 lb

## 2015-10-23 DIAGNOSIS — Z7189 Other specified counseling: Secondary | ICD-10-CM

## 2015-10-23 DIAGNOSIS — M545 Low back pain, unspecified: Secondary | ICD-10-CM

## 2015-10-23 DIAGNOSIS — E1142 Type 2 diabetes mellitus with diabetic polyneuropathy: Secondary | ICD-10-CM

## 2015-10-23 DIAGNOSIS — IMO0001 Reserved for inherently not codable concepts without codable children: Secondary | ICD-10-CM

## 2015-10-23 DIAGNOSIS — E1165 Type 2 diabetes mellitus with hyperglycemia: Secondary | ICD-10-CM

## 2015-10-23 DIAGNOSIS — M25511 Pain in right shoulder: Secondary | ICD-10-CM

## 2015-10-23 DIAGNOSIS — G8929 Other chronic pain: Secondary | ICD-10-CM

## 2015-10-23 LAB — POCT GLYCOSYLATED HEMOGLOBIN (HGB A1C): Hemoglobin A1C: 10.6

## 2015-10-23 MED ORDER — HYDROCODONE-ACETAMINOPHEN 10-325 MG PO TABS: 1.0000 | ORAL_TABLET | Freq: Three times a day (TID) | ORAL | Status: DC | PRN

## 2015-10-23 NOTE — Progress Notes (Signed)
Subjective:    Patient ID: Colin Keller, male    DOB: 10/02/1956, 59 y.o.   MRN: [id number]  Colin KaufmanDavid W Steinbach is a 59 y.o. male presenting on 10/23/2015 for Follow-up   HPI  CHRONIC DM, Type 2: - Reports continues to have "fluctuating" blood sugars, tries to improve DM diet but still admits to eating a lot of potatoes and rice, unpredictable diet can go >5 hours without eating. Weight down 4 lbs in 3 months. CBGs: Avg - 170s, Low 60 (x 1) usually 80s-90s, High: 358 - check CBG AM fasting, some evening sugars (check right after dinner vs waiting 2 hr after) Meds: 70/30 20u in AM (8 to 9am, before meal) / 15u PM (7 to 9pm, 1-2 hr after eating), Metformin 1000mg  BID, reports previously with higher doses has had low blood sugars more easily. Also states unable to afford lantus. Reports good compliance. Tolerating well Currently on ACEi Lifestyle: Diet (trying to improve DM diet but does not always adhere to this) / Exercise (remains active at work without regular exericse Denies hypoglycemia, polyuria, visual changes, tingling.  H/O TOBACCO ABUSE - Quit 09/2014, previously resumed. - Continues to smoke half pack daily - Wants to quit, not ready  CHRONIC PAIN SYNDROME / PERIPHERAL NEUROPATHY: - Last follow-up 8./31/16 for chronic pain. Since that visit he reports discontinuing the Gabapentin 100mg  at night as he stated it kept him awake, "made me more nervous" - H/o chronic LBP with h/o lumbar disc herniation and intermittent radicular pain, additionally with chronic bilateral shoulder pain, evaluated within past 1 year, likely bursitis, pain medicine is not helping shoulder pain - Continues to take Norco 10/325 as prescribed up to TID, takes 2-3 daily. Requests 3 month refill today. - No concerns or worsening symptoms today - Admits prior history with intermittent radicular pain, seems improved - Denies any focal weakness, tingling, or weakness in either lower ext, no saddle anesthesia,  urinary incontinence or retention  Past Medical History  Diagnosis Date  . Diabetes mellitus   . GERD (gastroesophageal reflux disease)   . Hypertension   . Hyperlipidemia   . Tobacco abuse     Social History   Social History  . Marital Status: Married    Spouse Name: N/A  . Number of Children: N/A  . Years of Education: N/A   Occupational History  . Not on file.   Social History Main Topics  . Smoking status: Current Every Day Smoker -- 0.20 packs/day    Types: Cigarettes    Last Attempt to Quit: 09/15/2014  . Smokeless tobacco: Never Used  . Alcohol Use: Not on file  . Drug Use: No  . Sexual Activity: Yes   Other Topics Concern  . 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 He and his wife were both laid off during recession. Wife [name] is also [facility] patient. Grown children. 1ppd smoker. Previously used marijuana -quit 10/03          Current Outpatient Prescriptions on File Prior to Visit  Medication Sig  . aspirin 81 MG tablet Take 81 mg by mouth daily.  . cyclobenzaprine (FLEXERIL) 10 MG tablet TAKE 1 TABLET BY MOUTH 3 TIMES DAILY AS NEEDED FOR MUSCLE SPASMS  . Insulin Aspart Prot & Aspart (NOVOLOG 70/30 MIX) (70-30) 100 UNIT/ML Pen 25 units q AM and 17 units QHS (Patient taking differently: 15 units q AM and 20units QHS)  . lisinopril-hydrochlorothiazide (ZESTORETIC) 20-12.5 MG per  tablet Take 1 tablet by mouth daily.  . metFORMIN (GLUCOPHAGE) 1000 MG tablet Take 1 tablet (1,000 mg total) by mouth 2 (two) times daily.  . metoprolol (LOPRESSOR) 50 MG tablet Take 0.5 tablets (25 mg total) by mouth 2 (two) times daily.  . pravastatin (PRAVACHOL) 40 MG tablet Take 1 tablet (40 mg total) by mouth daily.  . ranitidine (ZANTAC) 75 MG tablet Take 75 mg by mouth daily.   No current facility-administered medications on file prior to visit.    Review of Systems  Constitutional: Negative for fever, chills, diaphoresis, activity change,  appetite change and fatigue.  Eyes: Negative for visual disturbance.  Respiratory: Negative for cough and shortness of breath.   Cardiovascular: Negative for chest pain and leg swelling.  Gastrointestinal: Negative for nausea, vomiting, abdominal pain, diarrhea and constipation.  Musculoskeletal: Positive for back pain (without active pain today) and arthralgias (bilateral shoulder pain and stiffness, persistent). Negative for neck pain.  Skin: Negative for rash.  Neurological: Positive for numbness (intermittent left arm, occasional with pain and numbness in left leg. None actively). Negative for dizziness, weakness, light-headedness and headaches.   Per HPI unless specifically indicated above     Objective:    Temp(Src) 98.3 F (36.8 C) (Oral)  Ht  (1.778 m)  Wt 160 lb 11.2 oz (72.893 kg)  BMI 23.06 kg/m2  Wt Readings from Last 3 Encounters:  10/23/15 160 lb 11.2 oz (72.893 kg)  08/12/15 164 lb 11.2 oz (74.707 kg)  05/21/15 166 lb (75.297 kg)    Physical Exam  Constitutional: He is oriented to person, place, and time. He appears well-developed and well-nourished. No distress.  HENT:  Mouth/Throat: Oropharynx is clear and moist.  Neck: Normal range of motion. Neck supple. No thyromegaly present.  Cardiovascular: Normal rate, regular rhythm, normal heart sounds and intact distal pulses.   No murmur heard. Pulmonary/Chest: Effort normal and breath sounds normal. No respiratory distress. He has no wheezes. He has no rales.  Musculoskeletal: Normal range of motion. He exhibits no edema or tenderness.  Muscle str normal 5/5 in distal ext.  Lymphadenopathy:    He has no cervical adenopathy.  Neurological: He is alert and oriented to person, place, and time.  Skin: Skin is warm and dry. No rash noted. He is not diaphoretic.  Nursing note and vitals reviewed.  Results for orders placed or performed in visit on 10/23/15  HgB A1c  Result Value Ref Range   Hemoglobin A1C 10.6         Assessment & Plan:   Problem List Items Addressed This Visit      Endocrine   Diabetic neuropathy (HCC)    Self discontinued Gabapentin. Continue to follow, consider future Lyrica      DM (diabetes mellitus), type 2, uncontrolled (HCC) - Primary    Remains uncontrolled to continued gradual worsening control with labile CBG, now seems to have less peaks (no longer 500s, high is mid 300s, and low is much less frequently < 60, now 80-90s). Continues with poor dietary habits (erratic eating schedule, not adhering to low carb diet), infrequent exercise. Current HgbA1c 10.6 (11/11), last HgbA1c 10.3 (07/2015). Complications - peripheral neuropathy, CKD-III, no DM retinopathy  Plan:  1. Continue current therapy with 70/30 insulin BID dosing 20u AM / 15u PM, advised pt to titrate up by 1 unit every 3 days if fasting CBG >250 (start with PM novolog to get back to near 18-20u, then switch to increasing AM dose. Confusing regimen  as he continues to change it more rapidly and less adhering to recommended advice. 2. Lifestyle Mods - Significantly needs to improve DM diet (dec carbs, inc vegs), regular smaller frequent meals, avoid waiting >5 hr without eating, inc protein foods, No potatoes. Encouraged to start regular exercise scheduled. 3. Referral to Dr. Gerilyn Pilgrim - DM nutrition (he did not call last time, encouraged him to schedule this) 4. Referral to Dr. Raymondo Band for DM management, 2-4 weeks, limited options due to no ins, cost, and labile CBGs on 70/30 dosing, ideally would benefit from basal insulin, if patient could get orange card and MAP may be option 5. RTC 3 mo for A1c      Relevant Orders   HgB A1c (Completed)     Other   BACK PAIN, LUMBAR (Chronic)    Stable chronic LBP. No acute flare today. Without new concerns. Controlled on Norco - Last MRI 2003, small disc herniation L3-4, L4-5  Plan: 1. Refilled Norco 10/325 q 8 hr PRN (#90, 0 refills) printed x 3 refills 2. Off  gabapentin 3. Consider future referral to ortho for possible lumbar epidural injections if leg pain worsens 4. RTC 3 months for refills - consider repeat Lumbar X-ray imaging vs future MRI      Relevant Medications   HYDROcodone-acetaminophen (NORCO) 10-325 MG tablet   Chronic right shoulder pain    Improved on Norco. Still has chronic intermittent pain. No radicular symptoms. Consider future steroid injection subacromial if becomes consistent with impingement syndrome.      Encounter for chronic pain management    Other Visit Diagnoses    Bilateral low back pain without sciatica  (Chronic)       Relevant Medications    HYDROcodone-acetaminophen (NORCO) 10-325 MG tablet       Meds ordered this encounter  Medications  . DISCONTD: HYDROcodone-acetaminophen (NORCO) 10-325 MG tablet    Sig: Take 1 tablet by mouth every 8 (eight) hours as needed for moderate pain or severe pain.    Dispense:  90 tablet    Refill:  0    Do not fill before 09/26/15  . DISCONTD: HYDROcodone-acetaminophen (NORCO) 10-325 MG tablet    Sig: Take 1 tablet by mouth every 8 (eight) hours as needed for moderate pain or severe pain.    Dispense:  90 tablet    Refill:  0  . DISCONTD: HYDROcodone-acetaminophen (NORCO) 10-325 MG tablet    Sig: Take 1 tablet by mouth every 8 (eight) hours as needed for moderate pain or severe pain.    Dispense:  90 tablet    Refill:  0    Do not fill before 11/12/15  . HYDROcodone-acetaminophen (NORCO) 10-325 MG tablet    Sig: Take 1 tablet by mouth every 8 (eight) hours as needed for moderate pain or severe pain.    Dispense:  90 tablet    Refill:  0    Do not fill before 12/13/15      Follow up plan: Return in about 3 months (around 01/23/2016) for diabetes.  Saralyn Pilar, DO [facility], PGY-3

## 2015-10-23 NOTE — Patient Instructions (Signed)
Dear Colin Keller, Thank you for coming in to clinic today. It was good to see you!  1. For your Diabetes - A1c 10.6 (from 10.3) slightly elevated but overall considered stable. Keep working hard to improve your diet. Call Dr. Gerilyn PilgrimSykes to schedule a Nutritionist appointment to review your diet in detail. - Start increasing insulin - 20 units in morning and 16 units at night, go up on night dose by 1 unit every 3 days as long as not dropping below 90 sugar consistently in morning, stop at 18. - Go up on morning dose by 1 unit max (25 units) slowly as well  - Continue staying active and exercising - Continue current medicine regimen, discuss with Dr. Raymondo BandKoval possible change. Please write down on a calendar all of your blood sugar readings for next 2-3 weeks to bring to his appointment  Some important numbers from today's visit: Temp(Src) 98.3 F (36.8 C) (Oral)  Ht 5\' 10"  (1.778 m)  Wt 160 lb 11.2 oz (72.893 kg)  BMI 23.06 kg/m2 Results -  Please schedule a follow-up appointment with Dr. Raymondo BandKoval (Pharmacy Clinic - Diabetes) in 1-2 monthsfor follow-up Insulin Management and Smoking  Follow-up with Dr. Gerilyn PilgrimSykes - call number on card  Follow-up with Dr. Althea CharonKaramalegos in 3 months (Diabetes, Chronic pain)  If you have any other questions or concerns, please feel free to call the clinic to contact me. You may also schedule an earlier appointment if necessary.  However, if your symptoms get significantly worse, please go to the Emergency Department to seek immediate medical attention.  Colin PilarAlexander Karamalegos, DO [facility]

## 2015-10-25 NOTE — Assessment & Plan Note (Signed)
Self discontinued Gabapentin. Continue to follow, consider future Lyrica

## 2015-10-25 NOTE — Assessment & Plan Note (Addendum)
Remains uncontrolled to continued gradual worsening control with labile CBG, now seems to have less peaks (no longer 500s, high is mid 300s, and low is much less frequently < 60, now 80-90s). Continues with poor dietary habits (erratic eating schedule, not adhering to low carb diet), infrequent exercise. Current HgbA1c 10.6 (11/11), last HgbA1c 10.3 (07/2015). Complications - peripheral neuropathy, CKD-III, no DM retinopathy  Plan:  1. Continue current therapy with 70/30 insulin BID dosing 20u AM / 15u PM, advised pt to titrate up by 1 unit every 3 days if fasting CBG >250 (start with PM novolog to get back to near 18-20u, then switch to increasing AM dose. Confusing regimen as he continues to change it more rapidly and less adhering to recommended advice. 2. Lifestyle Mods - Significantly needs to improve DM diet (dec carbs, inc vegs), regular smaller frequent meals, avoid waiting >5 hr without eating, inc protein foods, No potatoes. Encouraged to start regular exercise scheduled. 3. Referral to Dr. Gerilyn PilgrimSykes - DM nutrition (he did not call last time, encouraged him to schedule this) 4. Referral to Dr. Raymondo BandKoval for DM management, 2-4 weeks, limited options due to no ins, cost, and labile CBGs on 70/30 dosing, ideally would benefit from basal insulin, if patient could get orange card and MAP may be option 5. RTC 3 mo for A1c

## 2015-10-25 NOTE — Assessment & Plan Note (Signed)
Improved on Norco. Still has chronic intermittent pain. No radicular symptoms. Consider future steroid injection subacromial if becomes consistent with impingement syndrome.

## 2015-10-25 NOTE — Assessment & Plan Note (Addendum)
Stable chronic LBP. No acute flare today. Without new concerns. Controlled on Norco - Last MRI 2003, small disc herniation L3-4, L4-5  Plan: 1. Refilled Norco 10/325 q 8 hr PRN (#90, 0 refills) printed x 3 refills 2. Off gabapentin 3. Consider future referral to ortho for possible lumbar epidural injections if leg pain worsens 4. RTC 3 months for refills - consider repeat Lumbar X-ray imaging vs future MRI

## 2015-12-15 ENCOUNTER — Encounter: Payer: Self-pay | Admitting: Family Medicine

## 2015-12-15 ENCOUNTER — Ambulatory Visit (INDEPENDENT_AMBULATORY_CARE_PROVIDER_SITE_OTHER): Payer: Self-pay | Admitting: Family Medicine

## 2015-12-15 VITALS — BP 124/75 | HR 115 | Temp 100.5°F | Ht 71.0 in | Wt 152.0 lb

## 2015-12-15 DIAGNOSIS — R112 Nausea with vomiting, unspecified: Secondary | ICD-10-CM

## 2015-12-15 DIAGNOSIS — J069 Acute upper respiratory infection, unspecified: Secondary | ICD-10-CM

## 2015-12-15 MED ORDER — DEXTROMETHORPHAN-GUAIFENESIN 15-200 MG/5ML PO LIQD
5.0000 mL | Freq: Four times a day (QID) | ORAL | Status: DC | PRN
Start: 2015-12-15 — End: 2016-04-19

## 2015-12-15 MED ORDER — GUAIFENESIN 200 MG/5ML PO LIQD: 10.0000 mL | ORAL | Status: DC | PRN

## 2015-12-15 MED ORDER — PROMETHAZINE HCL 25 MG PO TABS: 25.0000 mg | ORAL_TABLET | Freq: Three times a day (TID) | ORAL | Status: DC | PRN

## 2015-12-15 NOTE — Progress Notes (Signed)
    Subjective   Colin KaufmanDavid W Keller is a [age] y.o. male that presents for a same day visit  1. Illness: Patient reports symptoms started about about one week ago. He has headaches, nausea, sneezing, rhinorrhea, coughing, fatigue, chills, and subjective fever with normal fevers. He started vomiting today. He has not received the influenza vaccine. Sick contacts include a niece, although with only URI symptoms. He has been able to keep fluids down until last night. He is a smoker but states he quit today. He used to be about a 1PPD smoker.  ROS Per HPI  Social History  Substance Use Topics  . Smoking status: Former Smoker -- 0.20 packs/day    Types: Cigarettes    Quit date: 12/15/2015  . Smokeless tobacco: Never Used  . Alcohol Use: None    Allergies  Allergen Reactions  . Simvastatin Other (See Comments)    Arthralgias    Objective   BP 124/75 mmHg  Pulse 115  Temp(Src) 100.5 F (38.1 C) (Oral)  Ht 5\' 11"  (1.803 m)  Wt 152 lb (68.947 kg)  BMI 21.21 kg/m2  General: Well appearing, no distress HEENT:   Head:  Normocephalic  Eyes: Pupils equal and reactive to light/accomodation. Extraocular movements intact bilaterally.  Ears: Tympanic membranes normal bilaterally.  Nose/Throat: Nares patent bilaterally. Oropharnx clear and moist.  Neck: No cervical adenopathy bilaterally Respiratory/Chest: Clear to auscultation bilaterally. Unlabored work of breathing. No wheezing or rales. Cardiovascular: Regular rate and rhythm. Normal S1 and S2. No heart murmurs present. No extra heart sounds  Assessment and Plan   Meds ordered this encounter  Medications  . DISCONTD: Guaifenesin 200 MG/5ML LIQD    Sig: Take 10 mLs (400 mg total) by mouth every 4 (four) hours as needed.    Dispense:  420 mL    Refill:  0  . promethazine (PHENERGAN) 25 MG tablet    Sig: Take 1 tablet (25 mg total) by mouth every 8 (eight) hours as needed for nausea or vomiting.    Dispense:  20 tablet    Refill:  0    . Dextromethorphan-Guaifenesin 15-200 MG/5ML LIQD    Sig: Take 5 mLs by mouth every 6 (six) hours as needed.    Dispense:  1 Bottle    Refill:  0    Upper respiratory infection: possible flu. Patient is well out of the window for treatment with Tamiflu and does not have insurance. Result would not change management. - dextromethorphan-guaifenesin for cough - phenergan for nausea - follow-up is symptoms fail to improve or if they worsen

## 2015-12-15 NOTE — Patient Instructions (Addendum)
Thank you for coming to see me today. It was a pleasure. Today we talked about:   Feeling sick: This may be the flu. I will test you for this but the test will not be immediate. At this point, there is nothing to give that will treat the infection effectively. However, I will give you something for your nausea called phenergan. Please take Tylenol for your pain/discomfort. You can take Tylenol 500-1000mg  up to three times per day. I will give you a cough syrup to help with symptoms of your cough. If your symptoms do not improve by Friday, please give me a call  Please make an appointment to see Dr. Althea CharonKaramalegos for follow-up of your Diabetes  If you have any questions or concerns, please do not hesitate to call the office at [phone].  Sincerely,  Jacquelin Hawkingalph Nettey, MD

## 2015-12-30 ENCOUNTER — Ambulatory Visit: Payer: Self-pay | Admitting: Family Medicine

## 2016-01-11 ENCOUNTER — Other Ambulatory Visit: Payer: Self-pay | Admitting: Family Medicine

## 2016-01-11 DIAGNOSIS — I10 Essential (primary) hypertension: Secondary | ICD-10-CM

## 2016-01-22 ENCOUNTER — Ambulatory Visit: Payer: Self-pay | Admitting: Family Medicine

## 2016-01-22 ENCOUNTER — Encounter: Payer: Self-pay | Admitting: Family Medicine

## 2016-01-22 ENCOUNTER — Ambulatory Visit (INDEPENDENT_AMBULATORY_CARE_PROVIDER_SITE_OTHER): Payer: Self-pay | Admitting: Family Medicine

## 2016-01-22 ENCOUNTER — Telehealth: Payer: Self-pay | Admitting: Family Medicine

## 2016-01-22 VITALS — BP 129/59 | HR 74 | Temp 98.2°F | Ht 71.0 in | Wt 165.0 lb

## 2016-01-22 DIAGNOSIS — IMO0001 Reserved for inherently not codable concepts without codable children: Secondary | ICD-10-CM

## 2016-01-22 DIAGNOSIS — F17200 Nicotine dependence, unspecified, uncomplicated: Secondary | ICD-10-CM

## 2016-01-22 DIAGNOSIS — E785 Hyperlipidemia, unspecified: Secondary | ICD-10-CM

## 2016-01-22 DIAGNOSIS — M545 Low back pain, unspecified: Secondary | ICD-10-CM

## 2016-01-22 DIAGNOSIS — G8929 Other chronic pain: Secondary | ICD-10-CM

## 2016-01-22 DIAGNOSIS — E1165 Type 2 diabetes mellitus with hyperglycemia: Secondary | ICD-10-CM

## 2016-01-22 LAB — POCT GLYCOSYLATED HEMOGLOBIN (HGB A1C): HEMOGLOBIN A1C: 11

## 2016-01-22 MED ORDER — PRAVASTATIN SODIUM 20 MG PO TABS: 20.0000 mg | ORAL_TABLET | ORAL | Status: DC

## 2016-01-22 MED ORDER — HYDROCODONE-ACETAMINOPHEN 10-325 MG PO TABS: 1.0000 | ORAL_TABLET | Freq: Three times a day (TID) | ORAL | Status: DC | PRN

## 2016-01-22 NOTE — Assessment & Plan Note (Signed)
Uncontrolled, gradual worsening A1c to 11 (from 10s). Recent illness for several weeks did not adhere to diet/exercise. Complications - peripheral neuropathy, CKD-III, no DM retinopathy  Plan:  1. Did not bring CBG logs with labile sugars (+hypoglycemia 60s), therefore continue recently increased 70/30 Insulin 22AM and 16PM 2. Continue Metformin  BID 3. Lifestyle - reviewed diet/exercise recs, unable to f/u with Dr Gerilyn Pilgrim due to cost (no ins) 4. Again, recommended next follow-up to Dr. Maryruth [facility] clinic for DM management 1 to 3 months for A1c (unable to get orange card, unlikely to proceed with basal insulin), I will follow-up 3 mo later for A1c

## 2016-01-22 NOTE — Patient Instructions (Addendum)
Dear Colin Keller, Thank you for coming in to clinic today. It was good to see you!  1. For your Diabetes - A1c 11 from 10.6) slowly increasing each visit. Keep working hard to improve your diet. Call Dr. Gerilyn Pilgrim to schedule a Nutritionist appointment to review your diet in detail. - Bring hand written blood sugar log in to next visit so we can adjust your insulin  - Continue staying active and exercising  - Continue current medicine regimen, discuss with Dr. Raymondo Band possible change. Please write down on a calendar all of your blood sugar readings for next 2-3 weeks to bring to his appointment  Please schedule a follow-up appointment with Dr. Raymondo Band (Pharmacy Clinic - Diabetes) in 3 months for follow-up Insulin Management and Smoking  Follow-up with Dr. Althea Charon in 6 months (Diabetes, Chronic pain)  Offered Flu Shot and Pneumonia Shot  If you have any other questions or concerns, please feel free to call the clinic to contact me. You may also schedule an earlier appointment if necessary.  However, if your symptoms get significantly worse, please go to the Emergency Department to seek immediate medical attention.  Saralyn Pilar, DO [facility]

## 2016-01-22 NOTE — Telephone Encounter (Signed)
Pt called because he was seen today and only received 1 prescription for his Hydrocodone and usually he get's 3. Please call patient. jw

## 2016-01-22 NOTE — Assessment & Plan Note (Addendum)
Non-adherent to pravastatin half tab  daily, some myalgias but primarily declines to take it  Plan: 1. Reduce to Pravastatin  3 x weekly (MWF), sent new rx, then lowest dose will be twice weekly if needed, counseled on reduced frequency dosing still reduces ASCVD risk

## 2016-01-22 NOTE — Assessment & Plan Note (Addendum)
Stable chronic LBP. No acute flare today. Without new concerns. Controlled on Norco  Plan: 1. Refilled Norco 10/325 q 8 hr PRN (#90, 0 refills) printed x 1 refill, if needed will refill additional +2 months for pick-up  2. Consider future referral to ortho for possible lumbar epidural injections if leg pain worsens

## 2016-01-22 NOTE — Assessment & Plan Note (Signed)
Active smoker still 0.5ppd - Counseled on smoking cessation - Advised f/u with Dr Raymondo Band for Smoking Cessation

## 2016-01-22 NOTE — Progress Notes (Signed)
Subjective:    Patient ID: Colin Keller, male    DOB: 1956-06-15, 60 y.o.   MRN: [id number]  Colin Keller is a 60 y.o. male presenting on 01/22/2016 for Diabetes and Medication Refill  HPI  CHRONIC DM, Type 2: Recently states improved sugars, however reports there was 1-2 months over winter he was sick with flu and didn't work, and didn't adhere to regular DM diet. Started to take 2 tablespoons apple cider vinegar x 1 month CBGs: Avg - 200s, Low 66 (x 1 with poor eating) usually 80s-90s, High: < 400 - checks 1-2x daily (NO LOG) Meds: 70/30 22u in AM (AM before meal) / 16u PM, Metformin  BID Reports good compliance. Tolerating well Currently on ACEi Lifestyle: Diet (previously didn't have good appetite and didn't follow DM diet when sick in Dec/Jan, now trying to improve) / Exercise (no regular exercise, now back to work) - Does not qualify for [employer], as prev discussed, we were going to try to get him covered for lantus through MAP but he has no coverage Denies hypoglycemia, polyuria, visual changes, tingling, dizziness  HLD / OBESITY - Last lipid panel in 07/2014 with LDL 112, and last direct LDL 106 in 02/2015 - Weight up 5 lbs since 10/2015, interestingly up +10 lbs since 12/15/15, states he lost weight with flu-like illness - Discontinued self off of Pravastatin , was taking half for  daily, had some mild myalgias but did not state these were new with pravastatin, did not like to take statins.  CHRONIC PAIN SYNDROME - H/o chronic LBP with h/o lumbar disc herniation and intermittent radicular pain, additionally with chronic bilateral shoulder pain,  - Continues to take Norco 10/325 as prescribed up to TID, takes 2-3 daily. Requests refill - Denies any focal weakness, tingling, or weakness in either lower ext, no saddle anesthesia, urinary incontinence or retention  H/O TOBACCO ABUSE - Quit 09/2014, previously resumed. - Continues to smoke half pack daily. He  quit for 3-4 days when recently sick in December, then resumed.  HM: - Declines flu shot and pneumonia vaccine due to no insurance  Social History  Substance Use Topics  . Smoking status: Former Smoker -- 0.20 packs/day    Types: Cigarettes    Quit date: 12/15/2015  . Smokeless tobacco: Never Used  . Alcohol Use: Not on file   Review of Systems Per HPI unless specifically indicated above     Objective:    BP 129/59 mmHg  Pulse 74  Temp(Src) 98.2 F (36.8 C) (Oral)  Ht  (1.803 m)  Wt 165 lb (74.844 kg)  BMI 23.02 kg/m2  Wt Readings from Last 3 Encounters:  01/22/16 165 lb (74.844 kg)  12/15/15 152 lb (68.947 kg)  10/23/15 160 lb 11.2 oz (72.893 kg)    Physical Exam  Constitutional: He is oriented to person, place, and time. He appears well-developed and well-nourished. No distress.  HENT:  Mouth/Throat: Oropharynx is clear and moist.  Neck: Normal range of motion. Neck supple. No thyromegaly present.  Cardiovascular: Normal rate, regular rhythm, normal heart sounds and intact distal pulses.   No murmur heard. Pulmonary/Chest: Effort normal and breath sounds normal. No respiratory distress. He has no wheezes. He has no rales.  Musculoskeletal: Normal range of motion. He exhibits no edema or tenderness.  Low Back without deformity, spasm. Mild lumbar paraspinal tenderness bilaterally. Seated SLR negative. Muscle str normal 5/5 in distal ext.  Lymphadenopathy:    He has no  cervical adenopathy.  Neurological: He is alert and oriented to person, place, and time.  Skin: Skin is warm and dry. No rash noted. He is not diaphoretic.  Nursing note and vitals reviewed.  Results for orders placed or performed in visit on 01/22/16  POCT A1C  Result Value Ref Range   Hemoglobin A1C 11.0       Assessment & Plan:   Problem List Items Addressed This Visit    BACK PAIN, LUMBAR (Chronic)    Stable chronic LBP. No acute flare today. Without new concerns. Controlled on  Norco  Plan: 1. Refilled Norco 10/325 q 8 hr PRN (#90, 0 refills) printed x 1 refill, if needed will refill additional +2 months for pick-up  2. Consider future referral to ortho for possible lumbar epidural injections if leg pain worsens      Relevant Medications   HYDROcodone-acetaminophen (NORCO) 10-325 MG tablet   DM (diabetes mellitus), type 2, uncontrolled (HCC) - Primary    Uncontrolled, gradual worsening A1c to 11 (from 10s). Recent illness for several weeks did not adhere to diet/exercise. Complications - peripheral neuropathy, CKD-III, no DM retinopathy  Plan:  1. Did not bring CBG logs with labile sugars (+hypoglycemia 60s), therefore continue recently increased 70/30 Insulin 22AM and 16PM 2. Continue Metformin  BID 3. Lifestyle - reviewed diet/exercise recs, unable to f/u with Dr Gerilyn Pilgrim due to cost (no ins) 4. Again, recommended next follow-up to Dr. Maryruth H[facility] clinic for DM management 1 to 3 months for A1c (unable to get orange card, unlikely to proceed with basal insulin), I will follow-up 3 mo later for A1c      Relevant Medications   pravastatin (PRAVACHOL) 20 MG tablet   Other Relevant Orders   POCT A1C (Completed)   Hyperlipidemia    Non-adherent to pravastatin half tab  daily, some myalgias but primarily declines to take it  Plan: 1. Reduce to Pravastatin  3 x weekly (MWF), sent new rx, then lowest dose will be twice weekly if needed, counseled on reduced frequency dosing still reduces ASCVD risk      Relevant Medications   pravastatin (PRAVACHOL) 20 MG tablet   Tobacco use disorder    Active smoker still 0.5ppd - Counseled on smoking cessation - Advised f/u with Dr Raymondo Band for Smoking Cessation       Other Visit Diagnoses    Bilateral low back pain without sciatica  (Chronic)       Relevant Medications    HYDROcodone-acetaminophen (NORCO) 10-325 MG tablet       Meds ordered this encounter  Medications  . pravastatin (PRAVACHOL) 20 MG  tablet    Sig: Take 1 tablet (20 mg total) by mouth 3 (three) times a week.    Dispense:  30 tablet    Refill:  2  . HYDROcodone-acetaminophen (NORCO) 10-325 MG tablet    Sig: Take 1 tablet by mouth every 8 (eight) hours as needed for moderate pain or severe pain.    Dispense:  90 tablet    Refill:  0      Follow up plan: Return in about 3 months (around 04/20/2016) for diabetes, pharmacy clinic and smoking cessation.  Saralyn Pilar, DO [facility], PGY-3

## 2016-01-22 NOTE — Telephone Encounter (Signed)
Called patient back, spoke with his wife, as discussed this was an oversight on my part, normally we do 3 month supply with 3 separate rx. Printed x 2 new refill rx for Hydrocodone 10/325 take 1 every 8 hour PRN pain, #90 pills, 0 refill, x 2 printed, do not fill dates 02/19/16 and 03/21/16.  These 2 printed refill rx will be available for pick-up at [facility] front office at anytime starting today Fri 01/22/16, once available patient will return over next few days to weeks to pick these up.  Saralyn Pilar, DO [facility], PGY-3

## 2016-02-09 NOTE — Telephone Encounter (Signed)
Spoke with patient and wife, they request rx's be mailed to address listed in chart. Rx's mailed.

## 2016-02-19 ENCOUNTER — Other Ambulatory Visit: Payer: Self-pay | Admitting: Family Medicine

## 2016-02-19 DIAGNOSIS — M545 Low back pain, unspecified: Secondary | ICD-10-CM

## 2016-02-25 ENCOUNTER — Other Ambulatory Visit: Payer: Self-pay | Admitting: Family Medicine

## 2016-02-25 DIAGNOSIS — I10 Essential (primary) hypertension: Secondary | ICD-10-CM

## 2016-04-19 ENCOUNTER — Ambulatory Visit (INDEPENDENT_AMBULATORY_CARE_PROVIDER_SITE_OTHER): Payer: Self-pay | Admitting: Family Medicine

## 2016-04-19 ENCOUNTER — Encounter: Payer: Self-pay | Admitting: Family Medicine

## 2016-04-19 VITALS — BP 151/65 | HR 67 | Temp 98.3°F | Ht 71.0 in | Wt 167.1 lb

## 2016-04-19 DIAGNOSIS — G8929 Other chronic pain: Secondary | ICD-10-CM

## 2016-04-19 DIAGNOSIS — M545 Low back pain, unspecified: Secondary | ICD-10-CM

## 2016-04-19 DIAGNOSIS — Z7189 Other specified counseling: Secondary | ICD-10-CM

## 2016-04-19 DIAGNOSIS — E1165 Type 2 diabetes mellitus with hyperglycemia: Secondary | ICD-10-CM

## 2016-04-19 DIAGNOSIS — IMO0001 Reserved for inherently not codable concepts without codable children: Secondary | ICD-10-CM

## 2016-04-19 DIAGNOSIS — E1142 Type 2 diabetes mellitus with diabetic polyneuropathy: Secondary | ICD-10-CM

## 2016-04-19 DIAGNOSIS — M25511 Pain in right shoulder: Secondary | ICD-10-CM

## 2016-04-19 LAB — POCT GLYCOSYLATED HEMOGLOBIN (HGB A1C): HEMOGLOBIN A1C: 11

## 2016-04-19 MED ORDER — HYDROCODONE-ACETAMINOPHEN 10-325 MG PO TABS: 1.0000 | ORAL_TABLET | Freq: Three times a day (TID) | ORAL | Status: DC | PRN

## 2016-04-19 NOTE — Progress Notes (Signed)
Subjective:    Patient ID: Colin Keller, male    DOB: 08/11/1956, 60 y.o.   MRN: [id number]  Colin Keller is a 60 y.o. male presenting on 04/19/2016 for Follow-up  HPI  CHRONIC DM, Type 2: Fluctuating sugar per report. Has CBG log today, checks 1-2x daily various times CBGs: Avg - difficult to determine seems to be in 200 range still, Low 58 - recent increasing episodes of hypoglycemia (60s and 80-90s, symptomatic, various times AM vs PM), High >400 Meds: 70/30 22u in AM (AM before meal) / 15-6u PM, Metformin 1000mg  BID Reports good compliance. Tolerating well Currently on ACEi Lifestyle: Diet (eats 3 meals daily, not adhering regularly to DM diet but does try to reduce carbs, has not called Dr Gerilyn PilgrimSykes yet for nutrition visit, will do this soon), trying apple cider vinegear - Admits to increased stress at home and work, concern this is inc sugar - History of CKD with DM, has significantly limited his NSAID use only takes Advil 400mg  3-6x monthly with good relief Admits hypoglycemic episodes (above) Denies polyuria, visual changes, tingling, dizziness  CHRONIC PAIN SYNDROME - H/o chronic LBP with h/o lumbar disc herniation and intermittent radicular pain, additionally with chronic bilateral shoulder pain - Today with worsening Left shoulder pain, with repetitive activities, painting at work, now R-shoulder improved, continues to take [name] 10/325 as prescribed up to TID, takes 2-3 daily. Requests refill - Using heating pad to help fall asleep improves at night - Admits occasional arm numbness - Denies any focal weakness, tingling, or weakness in either lower ext, no saddle anesthesia, urinary incontinence or retention   Social History  Substance Use Topics  . Smoking status: Smoker, Current Status Unknown -- 0.50 packs/day    Types: Cigarettes    Last Attempt to Quit: 12/15/2015  . Smokeless tobacco: Never Used  . Alcohol Use: None   Review of Systems Per HPI unless specifically  indicated above     Objective:    BP 151/65 mmHg  Pulse 67  Temp(Src) 98.3 F (36.8 C)  Ht 5\' 11"  (1.803 m)  Wt 167 lb 1.6 oz (75.796 kg)  BMI 23.32 kg/m2  Wt Readings from Last 3 Encounters:  04/19/16 167 lb 1.6 oz (75.796 kg)  01/22/16 165 lb (74.844 kg)  12/15/15 152 lb (68.947 kg)    Physical Exam  Constitutional: He is oriented to person, place, and time. He appears well-developed and well-nourished. No distress.  Well-appearing, comfortable, cooperative  HENT:  Mouth/Throat: Oropharynx is clear and moist.  Neck: Normal range of motion. Neck supple. No thyromegaly present.  Cardiovascular: Normal rate, regular rhythm, normal heart sounds and intact distal pulses.   No murmur heard. Pulmonary/Chest: Effort normal and breath sounds normal. No respiratory distress. He has no wheezes. He has no rales.  Musculoskeletal: Normal range of motion. He exhibits no edema or tenderness.  Low Back without deformity, spasm. Mild lumbar paraspinal tenderness bilaterally. Muscle str normal 5/5 in distal ext.  Bilateral Shoulders Reduced AROM forward flex above shoulders and internal rotation behind back.  Lymphadenopathy:    He has no cervical adenopathy.  Neurological: He is alert and oriented to person, place, and time.  Skin: Skin is warm and dry. No rash noted. He is not diaphoretic.  Nursing note and vitals reviewed.   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 Difficult to adjust insulin given labile brittle CBGs - continue current regimen 70/30 Insulin 22AM and 16PM, concern with hypoglycemia episodes but unable to predict 2. Continue Metformin  BID 3. Lifestyle - follow-up with Nutrition Dr Gerilyn Pilgrim - will try to schedule in Nutrition clinic with me 4. Again, recommended next follow-up to Dr. [facility] clinic for DM management - suspect patient needs alternative agent such as GLP vs SLGT2 however concern without insurance for cost, alternatively may consider sulfonylurea x 1 daily with largest meal vs ER AM only, but again concern with significant hypoglycemia      Relevant Orders   HgB A1c (Completed)   Diabetic neuropathy (HCC)    Stable without worsening. DM Foot exam unremarkable today Future consider Lyrica, maybe able to reduce chronic opiate dose too (again concern with cost, no ins)      Chronic right shoulder pain    R-shoulder remains improved, now likely due to compensating has worsening L-shoulder similar pain, likely subacromial bursitis vs rotator cuff tendinopathy, no acute injury. At risk for frozen shoulder with DM. 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 DISCONTD: HYDROcodone-acetaminophen (NORCO) 10-325 MG tablet    Sig: Take 1 tablet by mouth every 8 (eight) hours as needed for moderate pain or severe pain.    Dispense:  90 tablet    Refill:  0  . DISCONTD:  HYDROcodone-acetaminophen (NORCO) 10-325 MG tablet    Sig: Take 1 tablet by mouth every 8 (eight) hours as needed for moderate pain or severe pain.    Dispense:  90 tablet    Refill:  0    Do not fill before 05/20/16  . HYDROcodone-acetaminophen (NORCO) 10-325 MG tablet    Sig: Take 1 tablet by mouth every 8 (eight) hours as needed for moderate pain or severe pain.    Dispense:  90 tablet    Refill:  0    Do not fill before 06/19/16      Follow up plan: Return in about 3 months (around 07/20/2016) for diabetes, chronic pain.  Saralyn Pilar, DO [facility], PGY-3

## 2016-04-19 NOTE — Patient Instructions (Signed)
Dear Colin Keller, Thank you for coming in to clinic today. It was good to see you!  1. For your Diabetes - A1c 11 (unchanged) Keep working hard to improve your diet.  Call Dr. Gerilyn PilgrimSykes to schedule a Nutritionist appointment to review your diet in detail.  - Continue staying active and exercising  You can take Aleve 220mg  (1 or 2 tablets) twice a day for up to 1 week then STOP for a few weeks. Or continue as you are with Ibuprofen. (These meds are all the same NSAID class Ibuprofen = Motrin = Advil = Aleve = Naproxen = Meloxicam)  If shoulder pain is significantly worsening, please return sooner for possible Steroid injection, or referral to orthopedic doctor / sports medicine   Please schedule a follow-up appointment with Dr. Raymondo BandKoval (Pharmacy Clinic - Diabetes) in 4 to 8 weeks for follow-up Diabetic Medication (Change to insulin or New additional Diabetes med)  Follow-up with New Doctor in 3 months for Diabetes, Chronic pain - Blood work  If you have any other questions or concerns, please feel free to call the clinic to contact me. You may also schedule an earlier appointment if necessary.  However, if your symptoms get significantly worse, please go to the Emergency Department to seek immediate medical attention.  Saralyn PilarAlexander Karamalegos, DO [facility]

## 2016-04-19 NOTE — Assessment & Plan Note (Addendum)
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

## 2016-04-19 NOTE — Assessment & Plan Note (Addendum)
Stable without worsening. DM Foot exam unremarkable today Future consider Lyrica, maybe able to reduce chronic opiate dose too (again concern with cost, no ins)

## 2016-04-19 NOTE — Assessment & Plan Note (Signed)
Uncontrolled, gradual worsening now stable A1c at 11 Complications - peripheral neuropathy, CKD-III, no DM retinopathy  Plan:  1. Difficult to adjust insulin given labile brittle CBGs - continue current regimen 70/30 Insulin 22AM and 16PM, concern with hypoglycemia episodes but unable to predict 2. Continue Metformin 1000mg  BID 3. Lifestyle - follow-up with Nutrition Dr Gerilyn PilgrimSykes - will try to schedule in Nutrition clinic with me 4. Again, recommended next follow-up to Dr[facility] for DM management - suspect patient needs alternative agent such as GLP vs SLGT2 however concern without insurance for cost, alternatively may consider sulfonylurea x 1 daily with largest meal vs ER AM only, but again concern with significant hypoglycemia

## 2016-04-19 NOTE — Assessment & Plan Note (Addendum)
R-shoulder remains improved, now likely due to compensating has worsening L-shoulder similar pain, likely subacromial bursitis vs rotator cuff tendinopathy, no acute injury. At risk for frozen shoulder with DM. 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

## 2016-05-05 ENCOUNTER — Ambulatory Visit: Payer: Self-pay | Admitting: Pharmacist

## 2016-05-19 ENCOUNTER — Encounter: Payer: Self-pay | Admitting: Pharmacist

## 2016-05-19 ENCOUNTER — Ambulatory Visit (INDEPENDENT_AMBULATORY_CARE_PROVIDER_SITE_OTHER): Payer: Self-pay | Admitting: Pharmacist

## 2016-05-19 VITALS — BP 168/60 | Wt 161.2 lb

## 2016-05-19 DIAGNOSIS — IMO0001 Reserved for inherently not codable concepts without codable children: Secondary | ICD-10-CM

## 2016-05-19 DIAGNOSIS — E785 Hyperlipidemia, unspecified: Secondary | ICD-10-CM

## 2016-05-19 DIAGNOSIS — E1165 Type 2 diabetes mellitus with hyperglycemia: Secondary | ICD-10-CM

## 2016-05-19 DIAGNOSIS — F17200 Nicotine dependence, unspecified, uncomplicated: Secondary | ICD-10-CM

## 2016-05-19 MED ORDER — INSULIN ASPART 100 UNIT/ML ~~LOC~~ SOLN: 5.0000 [IU] | Freq: Three times a day (TID) | SUBCUTANEOUS | Status: DC

## 2016-05-19 MED ORDER — INSULIN DETEMIR 100 UNIT/ML ~~LOC~~ SOLN
10.0000 [IU] | Freq: Two times a day (BID) | SUBCUTANEOUS | Status: DC
Start: 2016-05-19 — End: 2016-08-12

## 2016-05-19 NOTE — Progress Notes (Signed)
Patient ID: Colin Keller, male   DOB: 12/22/1955, 59 y.o.   MRN: [id number] Reviewed: Agree with Dr. Koval's documentation and management. 

## 2016-05-19 NOTE — Assessment & Plan Note (Signed)
Longstanding tobacco use currently smoking about 15 cigarettes per day. Patient is optimistic about quitting for health reasons. Goal is to decrease to 10 cigarettes per day by next visit and to quit by his birthday in August.

## 2016-05-19 NOTE — Patient Instructions (Signed)
Thanks for coming in today!  Stop taking Novolog 70/30 Mix.   Take Levemir 10 units twice a day. Take Novolog 5 units three times a day with meals.  Check blood sugar twice daily.   Continue working on decreasing to 10 cigarettes per day before our next visit!  Please call us if you begin seeing low blood sugars. Return to pharmacy clinic in 2 weeks for diabetes follow up.

## 2016-05-19 NOTE — Assessment & Plan Note (Signed)
Diabetes longstanding currently uncontrolled. Patient reports hypoglycemic events and is able to verbalize appropriate hypoglycemia management plan. The patient drinks a whole soda when he feels low. Patient reports complete adherence with medication. Control is suboptimal most likely due to diet. Control is difficult due to many lows but also many highs. We will stop the Novolin 70/30 and start insulin detemir 10 units BID and insulin aspart 5 units TID WC. Counseled patient to check his blood glucose at least twice daily at alternating meals. Also counseled to only drink half a soda when he feels low to try and avoid spikes in blood glucose.

## 2016-05-19 NOTE — Progress Notes (Signed)
    S:    Patient arrives in good spirits, ambulating without existence.  Presents for diabetes evaluation, education, and management at the request of Dr. Althea CharonKaramalegos. Patient was last seen by Primary Care Provider on 04/19/16.   Patient reports Diabetes was diagnosed in [id number].  Patient reports adherence with medications.  Current diabetes medications include: Novolin 70/30 20-22 units qAM and 15 units qHS Current hypertension medications include: lisinopril-HCTZ, metoprolol tartrate  Patient reports hypoglycemic events that occur almost daily around lunch time or before dinner. Sometimes wakes up at night about once per month from low blood sugar. When checks BG is typically 60-70s when feels low. Drinks a soda when low.   Reports Highs ~ 200-300s. Sometimes > 500. Patient reports not checking his BG often because he can feel when he is high/low.  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   Patient reports neuropathy mostly in hands, some in feet.  Patient reports visual changes. Hx of retinopathy, s/p corrective surgery. Patient denies self foot exams. Reports he could feel everything at his last foot exam by his doctor.   O:  Lab Results  Component Value Date   HGBA1C 11.0 04/19/2016   Filed Vitals:   05/19/16 1006  BP: 168/60    10 year ASCVD risk: 41.5%. Patient reports use of pravastatin 20 mg ~3x per week due to history of arthralgias on daily simvastatin and pravastatin. He is able to tolerate it 3x per week.  A/P: Diabetes longstanding currently uncontrolled. Patient reports hypoglycemic  events and is able to verbalize appropriate hypoglycemia management plan. The patient drinks a whole soda when he feels low. Patient reports complete adherence with medication. Control is suboptimal most likely due to diet. Control is difficult due to many lows but also many highs. We will stop the Novolin 70/30 and start insulin detemir 10 units BID and insulin aspart 5 units TID WC. Counseled patient to check his blood glucose at least twice daily at alternating meals. Also counseled to only drink half a soda when he feels low to try and avoid spikes in blood glucose.    Next A1C anticipated August 2017.    ASCVD risk greater than 7.5%. Continued Aspirin 81 mg and Continued pravastatin 20 mg three times weekly  Longstanding tobacco use currently smoking about 15 cigarettes per day. Patient is optimistic about quitting for health reasons. Goal is to decrease to 10 cigarettes per day by next visit and to quit by his birthday in August.  Hypertension longstanding currently uncontrolled.  Patient reports adherence with medication. Control is suboptimal due to difficulty adjusting medications due to wide pulse pressures. Continue lisinopril-HCTZ and metoprolol tartrate.  Written patient instructions provided.  Total time in face to face counseling 60 minutes.   Follow up in Pharmacist Clinic Visit in 2 weeks.   Patient seen with Crista CurbLaura Fritts, PharmD Candidate and Greggory Stallionristy Reyes, PharmD Resident.

## 2016-05-19 NOTE — Assessment & Plan Note (Signed)
ASCVD risk greater than 7.5%. Continued Aspirin 81 mg and Continued pravastatin 20 mg three times weekly

## 2016-05-30 ENCOUNTER — Telehealth: Payer: Self-pay | Admitting: Family Medicine

## 2016-05-30 NOTE — Telephone Encounter (Signed)
Reported that he had just made switch discussed at last visit.   Levemir 10 BID and Novolog 5 TID with meals.   He reports "HI" reading last night and also continues to have "300" readings.   Agreed to increase both Levemir and Novlog by 1 unit per dose.  New Doses  Levemir 11 units BID Novolog 6 units prior to each meal  Plan to increase similar amount in 3 days if he remains high.

## 2016-05-30 NOTE — Telephone Encounter (Signed)
Pt has some questions about his new insulin regime

## 2016-06-02 ENCOUNTER — Ambulatory Visit: Payer: Self-pay | Admitting: Pharmacist

## 2016-06-09 ENCOUNTER — Ambulatory Visit (INDEPENDENT_AMBULATORY_CARE_PROVIDER_SITE_OTHER): Payer: Self-pay | Admitting: Pharmacist

## 2016-06-09 ENCOUNTER — Encounter: Payer: Self-pay | Admitting: Pharmacist

## 2016-06-09 VITALS — BP 163/61 | HR 61 | Wt 161.4 lb

## 2016-06-09 DIAGNOSIS — E1165 Type 2 diabetes mellitus with hyperglycemia: Secondary | ICD-10-CM

## 2016-06-09 DIAGNOSIS — IMO0001 Reserved for inherently not codable concepts without codable children: Secondary | ICD-10-CM

## 2016-06-09 NOTE — Progress Notes (Signed)
    S:    Patient arrives in good spirits.  Presents for diabetes follow-up  Patient reports adherence with medications. Patient reports not taking lunchtime dose of insulin on occasion because of feeling low.  Current diabetes medications include: Novolog 6 units TID, Levemir 11 units BID, Metformin 1000 mg BID Current hypertension medications include: Lisinopril-HCTZ 20-12.5 mg once daily, Metoprolol 25 mg BID  Patient reports about 3 episodes of symptoms of hypoglycemic with BGs around 100-125 within the past 2 weeks. Usually around the middle of the day.  Denies any readings on his meter < 100.   Patient reports that he doesn't think his readings are correct because he has not changed out his test solution in quite a few months. He is supposed to be getting new solutions but he states that he "does not trust his readings". Had one reading that was 421 and he re-checked a few minutes later and it read at 355.   Patient reported dietary habits: Eats 3 meals/day Breakfast: eggs + sausage or ham, oatmeal, cereal Lunch: fast food usually, sandwich w/ whole wheat bread Dinner: frozen meals (corndogs), tuna salad sandwiches Snacks: crackers, oatmeal cookies Drinks: half and half tea, drinks 4 bottles of water per day  Patient reported exercise habits: none, works outside as a Education administratorpainter   Patient reports nocturia.  Patient reports neuropathy. Patient denies visual changes. Patient reports self foot exams.   O:  Lab Results  Component Value Date   HGBA1C 11.0 04/19/2016   Filed Vitals:   06/09/16 1113 06/09/16 1114  BP: 154/54 163/61  Pulse: 61    Home fasting CBG: 172-386 (was 212 this morning) 2 hour post-prandial/random CBG: 203-355.  10 year ASCVD risk: 25.2%  A/P: Diabetes longstanding currently uncontrolled. Patient reports hypoglycemic events and is able to verbalize appropriate hypoglycemia management plan. Patient reports adherence with medication. Control is suboptimal  due to patient diet and need for additionally medications. Continued basal insulin Levemir (insulin detemir) at 11 units BID. Adjusted dose of rapid insulin Novolog (insulin aspart) to 5 units with breakfast, 7 units at lunch, and 7 units at dinner. Next A1C anticipated in August.   ASCVD risk greater than 7.5%. Continued Aspirin 81 mg and Continued Pravastatin 20 mg three times per week.   Hypertension longstanding currently higher than goal isolated systolic.  Patient reports adherence with medication. Control is suboptimal due to possible need for additionally medications however symptoms of dizziness may limit titration or adjustment.   Written patient instructions provided.  Total time in face to face counseling 25 minutes.   Follow up in Pharmacist Clinic in 1 month.   Patient seen with Crista CurbLaura Fritts, PharmD Candidate and Lilla Shookachel Henderson, PharmD Resident.

## 2016-06-09 NOTE — Assessment & Plan Note (Signed)
Diabetes longstanding currently uncontrolled. Patient reports hypoglycemic events and is able to verbalize appropriate hypoglycemia management plan. Patient reports adherence with medication. Control is suboptimal due to patient diet and need for additionally medications. Continued basal insulin Levemir (insulin detemir) at 11 units BID. Adjusted dose of rapid insulin Novolog (insulin aspart) to 5 units with breakfast, 7 units at lunch, and 7 units at dinner. Next A1C anticipated in August.

## 2016-06-09 NOTE — Patient Instructions (Signed)
Thank you for coming in today.  Starting injecting 5 units of Novolog in the morning, 7 units at lunch, and 7 units at dinner. Continue inject 11 units of Levemir in the morning and at bedtime.

## 2016-06-09 NOTE — Progress Notes (Signed)
Patient ID: Colin KaufmanDavid W Keller, male   DOB: September 22, 1956, 60 y.o.   MRN: [id number] Reviewed: Agree with Dr. Macky LowerKoval's documentation and management.

## 2016-06-18 ENCOUNTER — Other Ambulatory Visit: Payer: Self-pay | Admitting: Family Medicine

## 2016-06-22 NOTE — Telephone Encounter (Signed)
Rx filled.  Katina Degreealeb M. Jimmey RalphParker, MD [facility] Resident PGY-3 06/22/2016 7:21 AM

## 2016-07-11 ENCOUNTER — Ambulatory Visit (INDEPENDENT_AMBULATORY_CARE_PROVIDER_SITE_OTHER): Payer: Self-pay | Admitting: Family Medicine

## 2016-07-11 ENCOUNTER — Encounter: Payer: Self-pay | Admitting: Family Medicine

## 2016-07-11 VITALS — BP 137/57 | HR 59 | Temp 98.1°F | Wt 160.0 lb

## 2016-07-11 DIAGNOSIS — I10 Essential (primary) hypertension: Secondary | ICD-10-CM

## 2016-07-11 DIAGNOSIS — Z794 Long term (current) use of insulin: Secondary | ICD-10-CM

## 2016-07-11 DIAGNOSIS — E118 Type 2 diabetes mellitus with unspecified complications: Secondary | ICD-10-CM

## 2016-07-11 DIAGNOSIS — E785 Hyperlipidemia, unspecified: Secondary | ICD-10-CM

## 2016-07-11 DIAGNOSIS — E1165 Type 2 diabetes mellitus with hyperglycemia: Secondary | ICD-10-CM

## 2016-07-11 DIAGNOSIS — IMO0001 Reserved for inherently not codable concepts without codable children: Secondary | ICD-10-CM

## 2016-07-11 LAB — POCT GLYCOSYLATED HEMOGLOBIN (HGB A1C): HEMOGLOBIN A1C: 10

## 2016-07-11 NOTE — Assessment & Plan Note (Signed)
At goal. Continue metoprolol, lisinopril, and HCTZ. Will check CMP.

## 2016-07-11 NOTE — Assessment & Plan Note (Signed)
A1c improved to 10 today. CBGs still above goal. Will not make any fine adjustments today as patient has follow up with Dr Raymondo Band in 3 days. Continue current doses of levemir, novolog, and metformin.

## 2016-07-11 NOTE — Assessment & Plan Note (Signed)
Tolerating pravastatin 20mg  three times weekly. Will check lipid panel. Has not tolerated higher doses of statin in the past.

## 2016-07-11 NOTE — Patient Instructions (Signed)
We will not make any medication changes today.  Please schedule a lab appointment prior to your Thursday appointment with Dr Raymondo Band. We will be checking your cholesterol, blood counts, liver function, and kidney function.   Come back to see me in 3 months, or sooner if you need anything else.  Take care,  Dr Jimmey Ralph

## 2016-07-11 NOTE — Progress Notes (Signed)
   Subjective:  Colin Keller is a 60 y.o. male who presents to the [facility] today with a chief complaint of T2DM follow up.   HPI:  T2DM Currently on a regimen of levemir 11U bid, novolog 5-7U with meals, and metformin 1000mg  bid. No polyuria or polydipsia. Occasionally feels like he gets a little low after lunch and has skipped a few lunchtime doses of novolog. Fasting CBGs usually in the 120s-170s. Afternoon and evening CBGs in the low 200s. Has follow up with Dr Raymondo Band in 3 days.   Hypertension BP Readings from Last 3 Encounters:  07/11/16 (!) 137/57  06/09/16 (!) 163/61  05/19/16 (!) 168/60   Home BP monitoring-Yes Compliant with medications-yes, without side effects ROS-Denies any CP, HA, SOB, blurry vision, LE edema, transient weakness, orthopnea, PND.   HLD Currently on parvastatin 20mg  three times weekly.   ROS: Per HPI  PMH: Smoking history reviewed.   Objective:  Physical Exam: BP (!) 137/57   Pulse (!) 59   Temp 98.1 F (36.7 C) (Oral)   Wt 160 lb (72.6 kg)   SpO2 100%   BMI 22.32 kg/m   Gen: NAD, resting comfortably CV: RRR with no murmurs appreciated Pulm: NWOB, CTAB with no crackles, wheezes, or rhonchi GI: Normal bowel sounds present. Soft, Nontender, Nondistended. MSK: no edema, cyanosis, or clubbing noted Skin: warm, dry Neuro: grossly normal, moves all extremities Psych: Normal affect and thought content  Results for orders placed or performed in visit on 07/11/16 (from the past 72 hour(s))  HgB A1c     Status: Abnormal   Collection Time: 07/11/16  4:10 PM  Result Value Ref Range   Hemoglobin A1C 10.0    Assessment/Plan:  DM (diabetes mellitus), type 2, uncontrolled A1c improved to 10 today. CBGs still above goal. Will not make any fine adjustments today as patient has follow up with Dr Raymondo Band in 3 days. Continue current doses of levemir, novolog, and metformin.   HYPERTENSION, BENIGN At goal. Continue metoprolol, lisinopril, and HCTZ. Will check  CMP.   Hyperlipidemia Tolerating pravastatin 20mg  three times weekly. Will check lipid panel. Has not tolerated higher doses of statin in the past.   Katina Degree. Jimmey Ralph, MD [facility] Resident PGY-3 07/11/2016 4:45 PM

## 2016-07-14 ENCOUNTER — Telehealth: Payer: Self-pay | Admitting: Family Medicine

## 2016-07-14 ENCOUNTER — Telehealth: Payer: Self-pay | Admitting: Pharmacist

## 2016-07-14 ENCOUNTER — Other Ambulatory Visit: Payer: Self-pay

## 2016-07-14 ENCOUNTER — Encounter: Payer: Self-pay | Admitting: Pharmacist

## 2016-07-14 ENCOUNTER — Ambulatory Visit (INDEPENDENT_AMBULATORY_CARE_PROVIDER_SITE_OTHER): Payer: Self-pay | Admitting: Pharmacist

## 2016-07-14 DIAGNOSIS — M545 Low back pain, unspecified: Secondary | ICD-10-CM

## 2016-07-14 DIAGNOSIS — IMO0001 Reserved for inherently not codable concepts without codable children: Secondary | ICD-10-CM

## 2016-07-14 DIAGNOSIS — E1165 Type 2 diabetes mellitus with hyperglycemia: Secondary | ICD-10-CM

## 2016-07-14 DIAGNOSIS — F17200 Nicotine dependence, unspecified, uncomplicated: Secondary | ICD-10-CM

## 2016-07-14 DIAGNOSIS — E785 Hyperlipidemia, unspecified: Secondary | ICD-10-CM

## 2016-07-14 DIAGNOSIS — I10 Essential (primary) hypertension: Secondary | ICD-10-CM

## 2016-07-14 LAB — COMPREHENSIVE METABOLIC PANEL
ALBUMIN: 4.5 g/dL (ref 3.6–5.1)
ALT: 11 U/L (ref 9–46)
AST: 13 U/L (ref 10–35)
Alkaline Phosphatase: 57 U/L (ref 40–115)
BUN: 45 mg/dL — ABNORMAL HIGH (ref 7–25)
CHLORIDE: 103 mmol/L (ref 98–110)
CO2: 22 mmol/L (ref 20–31)
CREATININE: 1.57 mg/dL — AB (ref 0.70–1.25)
Calcium: 10 mg/dL (ref 8.6–10.3)
Glucose, Bld: 369 mg/dL — ABNORMAL HIGH (ref 65–99)
Potassium: 6.4 mmol/L (ref 3.5–5.3)
SODIUM: 135 mmol/L (ref 135–146)
TOTAL PROTEIN: 7.5 g/dL (ref 6.1–8.1)
Total Bilirubin: 0.4 mg/dL (ref 0.2–1.2)

## 2016-07-14 LAB — CBC
HCT: 35.7 % — ABNORMAL LOW (ref 38.5–50.0)
HEMOGLOBIN: 11.6 g/dL — AB (ref 13.2–17.1)
MCH: 32 pg (ref 27.0–33.0)
MCHC: 32.5 g/dL (ref 32.0–36.0)
MCV: 98.3 fL (ref 80.0–100.0)
MPV: 10.9 fL (ref 7.5–12.5)
Platelets: 294 10*3/uL (ref 140–400)
RBC: 3.63 MIL/uL — AB (ref 4.20–5.80)
RDW: 14.1 % (ref 11.0–15.0)
WBC: 9.1 10*3/uL (ref 3.8–10.8)

## 2016-07-14 LAB — LIPID PANEL
CHOLESTEROL: 188 mg/dL (ref 125–200)
HDL: 60 mg/dL (ref >=40)
LDL Cholesterol: 110 mg/dL (ref <130)
TRIGLYCERIDES: 88 mg/dL (ref <150)
Total CHOL/HDL Ratio: 3.1 Ratio (ref <=5.0)
VLDL: 18 mg/dL (ref <30)

## 2016-07-14 MED ORDER — PRAVASTATIN SODIUM 20 MG PO TABS: 20.0000 mg | ORAL_TABLET | ORAL | 2 refills | Status: DC

## 2016-07-14 NOTE — Assessment & Plan Note (Signed)
Longstanding tobacco use currently smoking about 15 cigarettes per day. Patient is still optimistic about quitting for health reasons. Goal is to continue to decrease cigarettes and hopefully to quit by his birthday in August.

## 2016-07-14 NOTE — Telephone Encounter (Signed)
Patient wife calling patient states patient will run out of norco on 8/6. States patient asked for refills at his last appointment but MD forgot to give it.

## 2016-07-14 NOTE — Telephone Encounter (Signed)
Patient asks PCP refill for hydrocodone and provastatin. Please, follow up.

## 2016-07-14 NOTE — Assessment & Plan Note (Signed)
Hypertension longstanding diagnosed currently uncontrolled at todays visit.  Patient reports adherence with medication but did not take his medications today. Control is suboptimal due to possible white coat hypertension and adherence.  Will consider increasing Lisinopril/HCTZ at next visit if remains uncontrolled.

## 2016-07-14 NOTE — Telephone Encounter (Signed)
Called patient to discuss panic lab K of 6.4 mmol/L.  Patient denies using salt substitutes such as Mrs Sharilyn Sites.  He states that he has been only drinking a 20 oz Gatorade daily and ~2 bananas/week. Patient will come to clinic tomorrow for repeat BMET. Instructed patient to skip tomorrows dose of Lisinopril/HCTZ until clinic calls him with followup results.

## 2016-07-14 NOTE — Progress Notes (Signed)
S:    Patient arrives in good spirits, ambulating without assistance.  Presents for diabetes evaluation, education, and management at the request of Dr Jimmey Ralph. Patient was last seen in Pharmacy Clinic on 07/11/16.    Patient reports Diabetes was diagnosed in 51.   Patient denies adherence with medications except missing a 3-4 days of his lunch time insulin because he felt like his sugar was low.  He has not taken his medications this morning.  Current diabetes medications include: metformin 1000 mg daily, levemir 11 units BID, Novolog 5 units ACB, 6 units ACL, 6 units ACS.  Patient reports that he has seen negative news about metformin on the internet and on TV thus only taking 1000 mg Metformin daily Current hypertension medications include: metoprolol tartrate 25 mg BID, lisinopril/HCTZ 20/12.5 mg daily  Patient reports hypoglycemic events. He states that he had symptoms of nervousness and sweating a couple days per week since his last visit.  States he is drinking 1/2 a regular coke, pack of crackers, or homemade muffin to raise his BG. He states it is taking closer to 1 hour now for his symptoms to improve.   Patient reported dietary habits: States he has cut back on rice and bread since last visit. States he does not miss meals.   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    Patient reports nocturia 1x/night which has not changed recently.  Patient reports neuropathy in his feet, arms, and hands.  Patient denies visual changes. Patient reports self foot exams. Denies cuts/scrapes or s/sx of infection.   Smoking ~15 cigarettes per day.  States his goal of stopping smoking by his birthday his approaching this month.  Still wants to quit but doesn't want pharmacotherapy assistance at this time.     O:  Lab Results  Component Value Date    HGBA1C 10.0 07/11/2016   There were no vitals filed for this visit.  Home fasting CBG:  112, 225, 178, 129, 88, 190, 217, 214, 173, 280 2 hour post-prandial/random CBG: 196, 314, 196, 151, 222, 86, 73  10 year ASCVD risk: 41.5%. Patient reports use of pravastatin 20 mg ~3x per week due to history of arthralgias on daily simvastatin and pravastatin. He is able to tolerate it 3x per week.  A/P: Diabetes longstanding currently uncontrolled with A1C above goal at 10%. Patient reports hypoglycemic events and is able to verbalize appropriate hypoglycemia management plan. Patient reports adherence with medication. Control is suboptimal due to diet and insulin resistance. Risk of hypoglycemia during occupational activities complicates management  Decreased dose of basal insulin Lantus (insulin glargine) to 10 units BID. Decreased dose of rapid insulin Novolog (insulin aspart) to 5 units ACB, 7 units ACL, 7 units ACS. Counseled on s/sx and treatment of hypoglycemia. Discussed risk/benefits of metformin and discussed risks that patient saw on TV.  Will continue metformin 1000 mg BID.  Will get BMET, CBC today. Next A1C anticipated 10/2016.    ASCVD risk greater than 7.5%. Continued Aspirin 81 mg and Continued pravastatin 20 mg 3x/week. Will get lipid panel today.    Hypertension longstanding diagnosed currently uncontrolled at todays visit.  Patient reports adherence with medication but did not take his medications today. Control is suboptimal due to possible white coat hypertension and adherence.  Will consider increasing Lisinopril/HCTZ at next visit if remains  uncontrolled.   Longstanding tobacco use currently smoking about 15 cigarettes per day. Patient is still optimistic about quitting for health reasons. Goal is to continue to decrease cigarettes and hopefully to quit by his birthday in August.    Written patient instructions provided.  Total time in face to face counseling 35 minutes.   Follow  up in Pharmacist Clinic Visit in 1 month.   Patient seen with Carole Binning, PharmD Candidate and Devota Pace, PharmD Resident and Hazle Nordmann, PharmD Resident.

## 2016-07-14 NOTE — Patient Instructions (Addendum)
Decrease Levemir to 10 units twice daily Increase Novolog to 5 units with breakfast, 7 units with lunch, and 7 units with dinner  If you have more frequent low blood sugars please call the clinic.   Increase Metformin to 1000 mg twice daily   For your blood pressure continue Lisinopril/Hydrochlorothiazine 20/12.5 mg 1 tablet by mouth once daily and metoprolol 50 mg - 1/2 tablet twice daily.   Followup in 1 month with Dr Raymondo Band

## 2016-07-14 NOTE — Progress Notes (Signed)
Patient ID: Colin Keller, male   DOB: 1956/01/01, 60 y.o.   MRN: [id number] Reviewed: Agree with Dr. Macky Lower documentation and management.

## 2016-07-14 NOTE — Assessment & Plan Note (Signed)
ASCVD risk greater than 7.5%. Continued Aspirin 81 mg and Continued pravastatin 20 mg 3x/week. Will get lipid panel today.

## 2016-07-14 NOTE — Assessment & Plan Note (Signed)
Diabetes longstanding currently uncontrolled with A1C above goal at 10%. Patient reports hypoglycemic events and is able to verbalize appropriate hypoglycemia management plan. Patient reports adherence with medication. Control is suboptimal due to diet and insulin resistance. Risk of hypoglycemia during occupational activities complicates management  Decreased dose of basal insulin Lantus (insulin glargine) to 10 units BID. Decreased dose of rapid insulin Novolog (insulin aspart) to 5 units ACB, 7 units ACL, 7 units ACS. Counseled on s/sx and treatment of hypoglycemia. Discussed risk/benefits of metformin and discussed risks that patient saw on TV.  Will continue metformin 1000 mg BID.  Will get BMET, CBC today. Next A1C anticipated 10/2016.

## 2016-07-15 ENCOUNTER — Other Ambulatory Visit: Payer: Self-pay

## 2016-07-15 ENCOUNTER — Telehealth: Payer: Self-pay | Admitting: Pharmacist

## 2016-07-15 DIAGNOSIS — IMO0001 Reserved for inherently not codable concepts without codable children: Secondary | ICD-10-CM

## 2016-07-15 DIAGNOSIS — E1165 Type 2 diabetes mellitus with hyperglycemia: Principal | ICD-10-CM

## 2016-07-15 LAB — BASIC METABOLIC PANEL
ANION GAP: 10 (ref 5–15)
BUN: 40 mg/dL — ABNORMAL HIGH (ref 6–20)
CALCIUM: 10 mg/dL (ref 8.9–10.3)
CO2: 22 mmol/L (ref 22–32)
Chloride: 103 mmol/L (ref 101–111)
Creatinine, Ser: 1.37 mg/dL — ABNORMAL HIGH (ref 0.61–1.24)
GFR, EST NON AFRICAN AMERICAN: 54 mL/min — AB (ref >60)
GLUCOSE: 319 mg/dL — AB (ref 65–99)
POTASSIUM: 5 mmol/L (ref 3.5–5.1)
Sodium: 135 mmol/L (ref 135–145)

## 2016-07-15 MED ORDER — PRAVASTATIN SODIUM 20 MG PO TABS: 20.0000 mg | ORAL_TABLET | ORAL | 2 refills | Status: DC

## 2016-07-15 MED ORDER — HYDROCODONE-ACETAMINOPHEN 10-325 MG PO TABS
1.0000 | ORAL_TABLET | Freq: Three times a day (TID) | ORAL | 0 refills | Status: DC | PRN
Start: 2016-07-15 — End: 2016-08-12

## 2016-07-15 NOTE — Telephone Encounter (Signed)
Left message of lab work returning to normal values for potassium.  Asked patient to return to previous medicaiton for blood pressure.   Reevaluate BMET and BP at next visit.

## 2016-07-15 NOTE — Telephone Encounter (Signed)
Attempted to call patient to discuss results of tests and let him know his Rx was ready for pick up. No answer on voice mail. Blood work significant for elevated potassium. Repeat testing this morning was normal.  Lucie Friedlander M. Jimmey Ralph, MD [facility] Resident PGY-3 07/15/2016 12:25 PM

## 2016-07-18 NOTE — Telephone Encounter (Signed)
Informed pt of his recent lab work and rx for pick up.

## 2016-07-20 LAB — HM DIABETES EYE EXAM

## 2016-08-12 ENCOUNTER — Encounter: Payer: Self-pay | Admitting: Pharmacist

## 2016-08-12 ENCOUNTER — Ambulatory Visit (INDEPENDENT_AMBULATORY_CARE_PROVIDER_SITE_OTHER): Payer: Self-pay | Admitting: Pharmacist

## 2016-08-12 VITALS — BP 159/58 | HR 68 | Wt 158.8 lb

## 2016-08-12 DIAGNOSIS — E785 Hyperlipidemia, unspecified: Secondary | ICD-10-CM

## 2016-08-12 DIAGNOSIS — F17200 Nicotine dependence, unspecified, uncomplicated: Secondary | ICD-10-CM

## 2016-08-12 DIAGNOSIS — G8929 Other chronic pain: Secondary | ICD-10-CM

## 2016-08-12 DIAGNOSIS — M545 Low back pain, unspecified: Secondary | ICD-10-CM

## 2016-08-12 DIAGNOSIS — Z7189 Other specified counseling: Secondary | ICD-10-CM

## 2016-08-12 DIAGNOSIS — E1165 Type 2 diabetes mellitus with hyperglycemia: Secondary | ICD-10-CM

## 2016-08-12 DIAGNOSIS — I10 Essential (primary) hypertension: Secondary | ICD-10-CM

## 2016-08-12 DIAGNOSIS — IMO0001 Reserved for inherently not codable concepts without codable children: Secondary | ICD-10-CM

## 2016-08-12 MED ORDER — INSULIN DETEMIR 100 UNIT/ML FLEXPEN: 11.0000 [IU] | PEN_INJECTOR | Freq: Two times a day (BID) | SUBCUTANEOUS | 0 refills | Status: DC

## 2016-08-12 MED ORDER — HYDROCODONE-ACETAMINOPHEN 10-325 MG PO TABS: 1.0000 | ORAL_TABLET | Freq: Three times a day (TID) | ORAL | 0 refills | Status: DC | PRN

## 2016-08-12 MED ORDER — HYDROCODONE-ACETAMINOPHEN 10-325 MG PO TABS
1.0000 | ORAL_TABLET | Freq: Three times a day (TID) | ORAL | 0 refills | Status: DC | PRN
Start: 2016-08-12 — End: 2016-10-28

## 2016-08-12 MED ORDER — HYDROCODONE-ACETAMINOPHEN 10-325 MG PO TABS
1.0000 | ORAL_TABLET | Freq: Three times a day (TID) | ORAL | 0 refills | Status: DC | PRN
Start: 2016-08-12 — End: 2016-09-19

## 2016-08-12 MED ORDER — INSULIN ASPART 100 UNIT/ML ~~LOC~~ SOLN: 5.0000 [IU] | Freq: Three times a day (TID) | SUBCUTANEOUS | 1 refills | Status: DC

## 2016-08-12 NOTE — Assessment & Plan Note (Signed)
Norco refilled today. Follow up in 2-3 months. Will need a repeat UDS and renewal of pain contract soon.

## 2016-08-12 NOTE — Assessment & Plan Note (Signed)
ASCVD risk greater than 7.5%. Continued aspirin 81 mg and pravastatin 20 mg 3x/week. Consider adding ezetimibe 10 mg once daily rather than increasing statin intensity due to patient reported myalgia at next visit.

## 2016-08-12 NOTE — Progress Notes (Signed)
Patient briefly saw during visit with Dr Raymondo BandKoval. I saw him last month for an office visit.  Overall doing well. He is still waiting to hear back from [facility]. 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 HE currently takes Norco which helps with his pain and ability to function. He needs a refill on his norco. This was refilled today.   I discussed this patient's care with Dr Raymondo BandKoval and agree the adjustments made to his treatment plan.   I asked patient to schedule follow up appointment with me in 2-3 months.   Katina Degreealeb M. Jimmey RalphParker, MD [facility] Resident PGY-3 08/12/2016 9:38 AM

## 2016-08-12 NOTE — Patient Instructions (Addendum)
Thanks for coming in today!  The only change today is to increase your Levemir to 11 units twice a day.    Keep up the good work! Follow up in October for your next A1c (pharmacy visit).

## 2016-08-12 NOTE — Progress Notes (Signed)
    S:    Patient arrives in good spirits, ambulating without assistance.  Presents for diabetes evaluation, education, and management follow up. Patient reports moderate adherence with medications, missing only 5 days/month. Current diabetes medications include: Novolog 5 units with breakfast, 6 units for lunch and 6 for dinner; Levemir 10 units BID; metformin 1000mg  BID. Current hypertension medications include: lisinopril/hctz 20/12.5 daily, metoprolol 25 mg BID.   Patient denies severe symptomatic hypoglycemic events but reports BG readings of 70 and 73. FBGs have been elevated, ranging from 300 to upper 400s but postprandial evening readings have remained in the 100-200 range. Patient denies nocturia.   Patient reported dietary habits: Eats 3 meals/day that are irregular in schedule and substance due to conflicting work schedules with his wife.   Patient also reports increasing back pain, for which he takes scheduled Norco. Pt's back pain has disrupted his sleep and limited his ability to work throughout the day.   Pt has implemented behaviors to reduce tobacco intake to 10-15 cigarettes per day (e.g. purposely leaving his cigarettes in his truck). Reports interest in smoking cessation but is wary of oral medications such as Chantix and bupropion for their reported risks in increasing suicidal ideation.  O:  Lab Results  Component Value Date   HGBA1C 10.0 07/11/2016   Vitals:   08/12/16 0910  BP: (!) 159/58  Pulse: 68   10 year ASCVD risk: 44.0%  A/P: Diabetes longstanding currently uncontrolled with A1c 10% however recent home readings with minimal low readings are important improvements for this patient with a history of erratic blood sugars.  Patient denies severe hypoglycemic events requiring extended time for recovery.   He is able to verbalize appropriate hypoglycemia management plan. Patient reports adherence with medication. Control is suboptimal due to irregular diet and  uncontrolled back pain. Increase basal insulin Levemir (insulin detemir) to 11 units BID. Continue rapid insulin Novolog (insulin aspart) 5-6-6 with meals. Next A1C anticipated 10/11/16.    Longstanding tobacco use currently smoking 10-15 cigarettes per day. Counseling provided for smoking cessation agents (bupropion, Chantix). Clarified that side effects of suicidal ideation and behavioral changes are most often seen in patients with underlying psychiatric disorders. Patient will reconsider utilizing oral agents next month.   ASCVD risk greater than 7.5%. Continued aspirin 81 mg and pravastatin 20 mg 3x/week. Consider adding ezetimibe 10 mg once daily rather than increasing statin intensity due to patient reported myalgia at next visit.  Hypertension longstanding currently uncontrolled per BP today of 159/58. Elevated BP today may be affected by pt's uncontrolled back pain. Patient reports adherence with medication. Continue lisinopril/hctz 20/12.5 mg daily and metoprolol 25 mg BID.  Reassess BP at next visit.     Longstanding back pain currently uncontrolled as patient has run out of Norco. Deferred to Dr. Jimmey RalphParker.    Written patient instructions provided.  Total time in face to face counseling 45 minutes.   Follow up in Pharmacist Clinic Visit late October for A1c.   Patient seen with Carole Binninghristine Chong, PharmD Candidate.

## 2016-08-12 NOTE — Assessment & Plan Note (Signed)
Longstanding tobacco use currently smoking 10-15 cigarettes per day. Counseling provided for smoking cessation agents (bupropion, Chantix). Clarified that side effects of suicidal ideation and behavioral changes are most often seen in patients with underlying psychiatric disorders. Patient will reconsider utilizing oral agents next month.

## 2016-08-12 NOTE — Assessment & Plan Note (Signed)
Diabetes longstanding currently uncontrolled with A1c 10% however recent home readings with minimal low readings are important improvements for this patient with a history of erratic blood sugars.  Patient denies severe hypoglycemic events requiring extended time for recovery.   He is able to verbalize appropriate hypoglycemia management plan. Patient reports adherence with medication. Control is suboptimal due to irregular diet and uncontrolled back pain. Increase basal insulin Levemir (insulin detemir) to 11 units BID. Continue rapid insulin Novolog (insulin aspart) 5-6-6 with meals. Next A1C anticipated 10/11/16.

## 2016-08-12 NOTE — Assessment & Plan Note (Signed)
Hypertension longstanding currently uncontrolled per BP today of 159/58. Elevated BP today may be affected by pt's uncontrolled back pain. Patient reports adherence with medication. Continue lisinopril/hctz 20/12.5 mg daily and metoprolol 25 mg BID.  Reassess BP at next visit.

## 2016-08-17 NOTE — Progress Notes (Signed)
Patient ID: Colin Keller, male   DOB: 09/27/1956, 60 y.o.   MRN: [id number] Reviewed: Agree with Dr. Koval's documentation and management. 

## 2016-08-25 ENCOUNTER — Other Ambulatory Visit: Payer: Self-pay | Admitting: *Deleted

## 2016-08-25 DIAGNOSIS — I10 Essential (primary) hypertension: Secondary | ICD-10-CM

## 2016-08-25 MED ORDER — LISINOPRIL-HYDROCHLOROTHIAZIDE 20-12.5 MG PO TABS: 1.0000 | ORAL_TABLET | Freq: Every day | ORAL | 3 refills | Status: DC

## 2016-08-25 NOTE — Telephone Encounter (Signed)
Rx filled.  Katina Degreealeb M. Jimmey RalphParker, MD [facility] Resident PGY-3 08/25/2016 12:05 PM

## 2016-09-19 ENCOUNTER — Encounter: Payer: Self-pay | Admitting: Pharmacist

## 2016-09-19 ENCOUNTER — Ambulatory Visit (INDEPENDENT_AMBULATORY_CARE_PROVIDER_SITE_OTHER): Payer: Self-pay | Admitting: Pharmacist

## 2016-09-19 VITALS — BP 160/67 | HR 59 | Wt 156.8 lb

## 2016-09-19 DIAGNOSIS — E1165 Type 2 diabetes mellitus with hyperglycemia: Secondary | ICD-10-CM

## 2016-09-19 DIAGNOSIS — Z79899 Other long term (current) drug therapy: Secondary | ICD-10-CM

## 2016-09-19 DIAGNOSIS — I10 Essential (primary) hypertension: Secondary | ICD-10-CM

## 2016-09-19 DIAGNOSIS — IMO0002 Reserved for concepts with insufficient information to code with codable children: Secondary | ICD-10-CM

## 2016-09-19 DIAGNOSIS — Z794 Long term (current) use of insulin: Secondary | ICD-10-CM

## 2016-09-19 DIAGNOSIS — IMO0001 Reserved for inherently not codable concepts without codable children: Secondary | ICD-10-CM

## 2016-09-19 DIAGNOSIS — E785 Hyperlipidemia, unspecified: Secondary | ICD-10-CM

## 2016-09-19 DIAGNOSIS — E1142 Type 2 diabetes mellitus with diabetic polyneuropathy: Secondary | ICD-10-CM

## 2016-09-19 LAB — LDL CHOLESTEROL, DIRECT: Direct LDL: 105 mg/dL (ref <130)

## 2016-09-19 LAB — VITAMIN B12: Vitamin B-12: 255 pg/mL (ref 200–1100)

## 2016-09-19 MED ORDER — INSULIN DETEMIR 100 UNIT/ML ~~LOC~~ SOLN: 11.0000 [IU] | Freq: Two times a day (BID) | SUBCUTANEOUS | 0 refills | Status: DC

## 2016-09-19 NOTE — Patient Instructions (Signed)
It was great to see you today!  Continue taking the same dose of Levemir - 11 units under the skin two times a day  Continue taking the same dose of Novolog - 5 units at breakfast, 6 units at lunch, & 6 units at dinner, change as follows:  -Decrease Novolog dose by 1 unit if a busy day ahead and blood sugar between 100-200  -Add 1 unit of Novolog to current dose if blood sugar between 200-300  Please follow-up in 1 month with Dr. Raymondo BandKoval.

## 2016-09-19 NOTE — Progress Notes (Addendum)
S:    Patient arrives in good spirits, ambulating without assistance, complains of continued back, neck and shoulder pain which continues to be problematic.  Denies weakness. Presents for diabetes evaluation, education, and management.  He has longstanding diabetes which has been managed in the past with split mix insulin.     Patient reports adherence with medications.  Current diabetes medications include:  Levemir 11 units twice daily and Novolog 5/6/6 units prior to meals.  Current hypertension medications include: Metoprolol, lisinopril and hctz.  States he has NOT taken his blood pressure medications this AM.   Patient reports hypoglycemic events of 2-3 times per week mostly immediately prior to lunch.  Admits to NOT taking insulin if lunch blood sugar  Patient reported dietary habits: Eats 3 meals/day consistently.    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    Patient reports neuropathy as tingling and numbness in both arms (left more than right).  States trial of gabapentin in the past caused vivid dreams.  Reports taking metformin for "a long time".  O:  Lab Results  Component Value Date   HGBA1C 10.0 07/11/2016   Vitals:   09/19/16 1056  BP: (!) 160/67  Pulse: (!) 59    Home fasting CBG: 56 - high 200 readings. Average fasting:  150-200.   2 hour post-prandial/random CBG: wide range however few readings.   Lunch readings with symptoms were found to be 70-80.   A/P: Diabetes longstanding currently with minimal low readings (hypoglycemic events are less frequent and less severe).  Patient is able to verbalize appropriate hypoglycemia management plan. Patient reports adherence with medication. Control is suboptimal due to erratic meal schedule and activity level, combined with amount of carbohydrate in his diet.   He admits to having a low appetite at times.  Continued basal insulin Levemir at 11 units twice daily.   Adjusted dose of rapid insulin Novolog (insulin aspart) to be 5/6/6 however gave patient two additional rules. Decrease Novolog dose by 1 unit if a busy day ahead and blood sugar between 100-200 AND Add 1 unit of Novolog to current dose if blood sugar between 200-300.   His mealtime doses should then be 4-6/5-7/5-7 with meals.  He is willing to check three times daily prior to meals to assist with dosing. Obtained B-12 today as patient has been taking metformin chronically and has neuropathy.  Sample of Levemir provided - has NOT yet heard from [place] re his insurance coverage. Next A1C anticipated next month.     ASCVD risk greater than 7.5%. Continued Aspirin 81 mg and Continued pravastatin 20 mg 2-3 times per week.  LDL was on panel was > 100.  Obtain FASTING Direct LDL today - consider add on ezetimibe or alternative generic statin in the future.   Hypertension longstanding currently not at goal likely related to not taking medications this AM however may also need additional medication as we improve his blood sugar readings.  No change today.  Asked patient to take BP meds as soon as he eats this AM.    Written patient instructions provided.  Total time in face to face counseling 30- minutes.   Follow up in Pharmacist Clinic Visit 1 month.   Patient seen with Alphonzo Severance, PharmD Candidate.    B-12 was normal but in the lower part of the normal range. - Consider repeat in 1 year.  LDL direct was similar to previous (greater than 100) on  max tolerated pravastatin.  Consider trial of alternate statin or combination of ezetimibe in the future.

## 2016-09-19 NOTE — Progress Notes (Signed)
Patient ID: Colin Keller, male   DOB: 07/29/1956, 60 y.o.   MRN: [id number] Reviewed: Agree with Dr. Koval's documentation and management. 

## 2016-09-19 NOTE — Assessment & Plan Note (Signed)
Diabetes longstanding currently with minimal low readings (hypoglycemic events are less frequent and less severe).  Patient is able to verbalize appropriate hypoglycemia management plan. Patient reports adherence with medication. Control is suboptimal due to erratic meal schedule and activity level, combined with amount of carbohydrate in his diet.   He admits to having a low appetite at times.  Continued basal insulin Levemir at 11 units twice daily.  Adjusted dose of rapid insulin Novolog (insulin aspart) to be 5/6/6 however gave patient two additional rules. Decrease Novolog dose by 1 unit if a busy day ahead and blood sugar between 100-200 AND Add 1 unit of Novolog to current dose if blood sugar between 200-300.   His mealtime doses should then be 4-6/5-7/5-7 with meals.  He is willing to check three times daily prior to meals to assist with dosing. Obtained B-12 today as patient has been taking metformin chronically and has neuropathy.  Next A1C anticipated next month.

## 2016-09-19 NOTE — Assessment & Plan Note (Signed)
Hypertension longstanding currently not at goal likely related to not taking medications this AM however may also need additional medication as we improve his blood sugar readings.  No change today.  Asked patient to take BP meds as soon as he eats this AM.

## 2016-09-19 NOTE — Assessment & Plan Note (Signed)
ASCVD risk greater than 7.5%. Continued Aspirin 81 mg and Continued pravastatin 20 mg 2-3 times per week.  LDL was on panel was > 100.  Obtain FASTING Direct LDL today - consider add on ezetimibe or alternative generic statin in the future.

## 2016-10-28 ENCOUNTER — Encounter: Payer: Self-pay | Admitting: Pharmacist

## 2016-10-28 ENCOUNTER — Ambulatory Visit (INDEPENDENT_AMBULATORY_CARE_PROVIDER_SITE_OTHER): Payer: Self-pay | Admitting: Pharmacist

## 2016-10-28 ENCOUNTER — Ambulatory Visit (INDEPENDENT_AMBULATORY_CARE_PROVIDER_SITE_OTHER): Payer: Self-pay | Admitting: Family Medicine

## 2016-10-28 ENCOUNTER — Encounter: Payer: Self-pay | Admitting: Family Medicine

## 2016-10-28 VITALS — BP 149/61 | HR 57 | Temp 97.7°F | Ht 71.0 in | Wt 157.4 lb

## 2016-10-28 DIAGNOSIS — M545 Low back pain, unspecified: Secondary | ICD-10-CM

## 2016-10-28 DIAGNOSIS — E785 Hyperlipidemia, unspecified: Secondary | ICD-10-CM

## 2016-10-28 DIAGNOSIS — E1165 Type 2 diabetes mellitus with hyperglycemia: Secondary | ICD-10-CM

## 2016-10-28 DIAGNOSIS — Z1211 Encounter for screening for malignant neoplasm of colon: Secondary | ICD-10-CM

## 2016-10-28 DIAGNOSIS — I10 Essential (primary) hypertension: Secondary | ICD-10-CM

## 2016-10-28 DIAGNOSIS — E1142 Type 2 diabetes mellitus with diabetic polyneuropathy: Secondary | ICD-10-CM

## 2016-10-28 DIAGNOSIS — IMO0002 Reserved for concepts with insufficient information to code with codable children: Secondary | ICD-10-CM

## 2016-10-28 DIAGNOSIS — IMO0001 Reserved for inherently not codable concepts without codable children: Secondary | ICD-10-CM

## 2016-10-28 DIAGNOSIS — Z794 Long term (current) use of insulin: Secondary | ICD-10-CM

## 2016-10-28 LAB — POCT GLYCOSYLATED HEMOGLOBIN (HGB A1C): Hemoglobin A1C: 10.2

## 2016-10-28 MED ORDER — HYDROCODONE-ACETAMINOPHEN 10-325 MG PO TABS: 1.0000 | ORAL_TABLET | Freq: Three times a day (TID) | ORAL | 0 refills | Status: DC | PRN

## 2016-10-28 MED ORDER — INSULIN DETEMIR 100 UNIT/ML ~~LOC~~ SOLN: 11.0000 [IU] | Freq: Two times a day (BID) | SUBCUTANEOUS | 0 refills | Status: DC

## 2016-10-28 MED ORDER — INSULIN ASPART 100 UNIT/ML ~~LOC~~ SOLN: 5.0000 [IU] | Freq: Three times a day (TID) | SUBCUTANEOUS | 1 refills | Status: DC

## 2016-10-28 MED ORDER — MELOXICAM 15 MG PO TABS: 15.0000 mg | ORAL_TABLET | Freq: Every day | ORAL | 0 refills | Status: DC

## 2016-10-28 NOTE — Patient Instructions (Signed)
Great to see you.   Hope you back continues to improve.   Please continue same dose dose of Levemir 11 units.   Change your Novolog to:  5-6 bkfst  5-7 lunch  7-9 dinner  Next visit in mid - December

## 2016-10-28 NOTE — Assessment & Plan Note (Signed)
Direct LDL 105 last month. Continue pravastatin 20mg  three times weekly. Consider adding additional agent in the future if tolerated.

## 2016-10-28 NOTE — Progress Notes (Signed)
Subjective:  Colin KaufmanDavid W Keller is a 60 y.o. male who [facility] today with a chief complaint of back pain.   HPI:  Back Pain Patient has a history of chronic low back pain. He is currently on norco for this, which treats his pain and allows him to function. About 4-5 weeks ago, he was helping his friends move a refrigerator when he had a sudden worsening of his back pain. Patient currently rates his pain as a 10/10 and says that it is severely limiting his ability to work. The norco still helps, but he still has a significant amount of pain even with this. Pain radiates down both legs, R>L. He has tried using ice at night which helps some. He has a history of chronic diabetic neuropathy, but says that his numbness and tingling is at his baseline. No weakness. No bowel or bladder incontinence. No saddle anesthesia. He will be going to his chiropractor's office to get an xray next week.   T2DM Currently on a regimen of 11unites twice a day with Novolog 5-7u before meals. Sugars have been running mostly in the 200s and 300s. No polyuria or polydipsia. Will be meeting with Dr Raymondo BandKoval later this morning for insulin management.   Hypertension BP Readings from Last 3 Encounters:  10/28/16 (!) 149/61  09/19/16 (!) 160/67  08/12/16 (!) 159/58   Home BP monitoring-Yes Compliant with medications-yes, without side effects ROS-Denies any CP, HA, SOB, blurry vision, LE edema, transient weakness, orthopnea, PND.   HLD Currently on pravastation 20mg  three times weekly.   ROS: Per HPI  PMH: Smoking history reviewed.    Objective:  Physical Exam: BP (!) 149/61 (BP Location: Left Arm, Patient Position: Sitting, Cuff Size: Normal)   Pulse (!) 57   Temp 97.7 F (36.5 C) (Oral)   Ht 5\' 11"  (1.803 m)   Wt 157 lb 6.4 oz (71.4 kg)   SpO2 100%   BMI 21.95 kg/m   Gen: NAD, resting comfortably CV: RRR with no murmurs appreciated Pulm: NWOB, CTAB with no crackles, wheezes, or rhonchi GI:  Normal bowel sounds present. Soft, Nontender, Nondistended. MSK:  - Back: No deformities. Tender to palpation in lower back across paraspinal muscles. Straight leg raise positive on the right.  - Leg: Strength 5/5 throughout. Sensation to light touch intact grossly. Skin: warm, dry Neuro: grossly normal, moves all extremities Psych: Normal affect and thought content  Results for orders placed or performed in visit on 10/28/16 (from the past 72 hour(s))  HgB A1c     Status: Abnormal   Collection Time: 10/28/16  9:48 AM  Result Value Ref Range   Hemoglobin A1C 10.2    Assessment/Plan:  BACK PAIN, LUMBAR Acute worsening since last visit. No red flag signs or symptoms. Will give short course of mobic. Patient has been intolerant of gabapentin in the past due to vivid dreams. Is not able to afford lyrica. Strict return precautions reviewed. Follow up in 1 week if not improving. Consider starting SNRI vs TCA if not improving. Can also consider schedule tylenol. Would avoid increasing therapy to oxycodone if possible.   DM (diabetes mellitus), type 2, uncontrolled (HCC) A1c 10.5 today. Several high sugars in the 200s and 300s. Will need increased dose of insulin. Patient meeting with Dr Raymondo BandKoval later this morning. Will defer management to him.   HYPERTENSION, BENIGN BP slightly above goal today. Give low diastolic pressures and occupational hazards (patient often climbs ladders), will avoid increasing therapy  at this time.   Hyperlipidemia Direct LDL 105 last month. Continue pravastatin 20mg  three times weekly. Consider adding additional agent in the future if tolerated.   Katina Degreealeb M. Jimmey RalphParker, MD [facility] Resident PGY-3 10/28/2016 11:11 AM

## 2016-10-28 NOTE — Assessment & Plan Note (Signed)
BP slightly above goal today. Give low diastolic pressures and occupational hazards (patient often climbs ladders), will avoid increasing therapy at this time.

## 2016-10-28 NOTE — Patient Instructions (Addendum)
Sorry to hear that your back is giving you trouble again.  We will start mobic for a wek.  Please get the xray.  If you are not getting better over the next week, please let us know.  Come back to see me in 1 week if you are not getting better.  Please send in the hemoccult cards.   Take care,  Dr Jimmey RalphParker

## 2016-10-28 NOTE — Progress Notes (Signed)
    S:    Patient arrives in normal affect despite apparent discomfort d/t back pain.  Presents for diabetes evaluation, education, and management at the request of Dr. Jimmey RalphParker.  Patient was last seen by Primary Care Provider on 10/28/2016.  Patient reports adherence with medications.   Current diabetes medications include: metformin 1000mg  PO twice daily, Novolog 5-7 units with each meal, Levemir 11 units twice daily. Current hypertension medications include: lisinopril-HCTZ 20-12.5mg  PO once daily, metoprolol tartrate 25 mg PO twice daily  Patient reports hypoglycemic events which occur rarely in the middle of the night, however he has noticed several in the late morning and early afternoon when he is highly busy.   Patient reported dietary habits: Eats 3 meals/day Breakfast, lunch and evening meal are all substantial and he is taking insulin with each meal.  Patient reported exercise habits:  Decreased lately due to back pain.    Reviewed the A1C trend over past 1.5 years.  Patient is aware that we have adjusted his regimen to minimize his number of low readings compared to the past on Novolin 70/30.   He agrees his control is better but also realizes his A1C control is similar.   He is motivated to make more changes to improve control.   O:  Lab Results  Component Value Date   HGBA1C 10.2 10/28/2016    Home fasting CBG: 150-250  2 hour post-prandial/random CBG: 75-200 with multiple readings in the 100s.  However he rarely check after his evening meal and is inactive after his evening meal.   He is very active after his breakfast and his lunch.    A/P: Diabetes longstanding currently uncontrolled due to severe back pain and inadequate meal dosing of Novolog. Patient reports a few late post prandial AM and late afternoon hypoglycemic events and is able to verbalize appropriate hypoglycemia management plan. Patient reports adherence with medication.  Continued basal insulin Levemir  (insulin detemir) at a dose of 11 units BID. Adjusted dose of rapid insulin Novolog (insulin aspart) to 5-6 units with breakfast, 5-7 units with lunch, 7-9 units with dinner. Next A1C anticipated 01/28/2017.    ASCVD risk greater than 7.5%. Continued Aspirin 81 mg and Continued pravastatin 20 mg three times per week.   Hypertension longstanding currently controlled considering risk posed to lifestyle by diastolic hypotension. 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 Wide pulse pressure may note presence of vascular rigidity due to hx of HTN and smoking hx.  Patient reports adherence with medication.   Written patient instructions provided.  Total time in face to face counseling 30 minutes.   Follow up in Pharmacist Clinic Visit in 3-4 weeks.   Patient seen with Alphonzo Severanceyan Ragan, PharmD Candidate.

## 2016-10-28 NOTE — Assessment & Plan Note (Signed)
Diabetes longstanding currently uncontrolled due to severe back pain and inadequate meal dosing of Novolog. Patient reports a few late post prandial AM and late afternoon hypoglycemic events and is able to verbalize appropriate hypoglycemia management plan. Patient reports adherence with medication.  Continued basal insulin Levemir (insulin detemir) at a dose of 11 units BID. Adjusted dose of rapid insulin Novolog (insulin aspart) to 5-6 units with breakfast, 5-7 units with lunch, 7-9 units with dinner. Next A1C anticipated 01/28/2017.

## 2016-10-28 NOTE — Addendum Note (Signed)
Addended by: Kathrin RuddyKOVAL, PETER G on: 10/28/2016 05:50 PM   Modules accepted: Orders

## 2016-10-28 NOTE — Assessment & Plan Note (Signed)
A1c 10.5 today. Several high sugars in the 200s and 300s. Will need increased dose of insulin. Patient meeting with Dr Raymondo BandKoval later this morning. Will defer management to him.

## 2016-10-28 NOTE — Assessment & Plan Note (Signed)
Acute worsening since last visit. No red flag signs or symptoms. Will give short course of mobic. Patient has been intolerant of gabapentin in the past due to vivid dreams. Is not able to afford lyrica. Strict return precautions reviewed. Follow up in 1 week if not improving. Consider starting SNRI vs TCA if not improving. Can also consider schedule tylenol. Would avoid increasing therapy to oxycodone if possible.

## 2016-10-31 NOTE — Progress Notes (Signed)
Patient ID: Colin KaufmanDavid W Palm, male   DOB: Aug 25, 1956, 60 y.o.   MRN: [id number] Reviewed: Agree with Dr. Macky LowerKoval's documentation and management.

## 2016-11-18 ENCOUNTER — Telehealth: Payer: Self-pay | Admitting: Family Medicine

## 2016-11-18 NOTE — Telephone Encounter (Signed)
Pt is calling and said that [facility] gave him a disk with his records. This has been placed in the doctor's box. jw

## 2016-11-21 NOTE — Telephone Encounter (Signed)
Disc received. Will look into getting uploaded into EPIC.  Katina Degreealeb M. Jimmey RalphParker, MD [facility] Resident PGY-3 11/21/2016 4:14 PM

## 2016-12-14 ENCOUNTER — Encounter: Payer: Self-pay | Admitting: Family Medicine

## 2016-12-14 ENCOUNTER — Ambulatory Visit (INDEPENDENT_AMBULATORY_CARE_PROVIDER_SITE_OTHER): Payer: Self-pay | Admitting: Family Medicine

## 2016-12-14 DIAGNOSIS — Z794 Long term (current) use of insulin: Secondary | ICD-10-CM

## 2016-12-14 DIAGNOSIS — E1165 Type 2 diabetes mellitus with hyperglycemia: Secondary | ICD-10-CM

## 2016-12-14 DIAGNOSIS — M545 Low back pain, unspecified: Secondary | ICD-10-CM

## 2016-12-14 DIAGNOSIS — IMO0002 Reserved for concepts with insufficient information to code with codable children: Secondary | ICD-10-CM

## 2016-12-14 DIAGNOSIS — E1142 Type 2 diabetes mellitus with diabetic polyneuropathy: Secondary | ICD-10-CM

## 2016-12-14 MED ORDER — HYDROCODONE-ACETAMINOPHEN 10-325 MG PO TABS: 1.0000 | ORAL_TABLET | Freq: Three times a day (TID) | ORAL | 0 refills | Status: DC | PRN

## 2016-12-14 MED ORDER — AMITRIPTYLINE HCL 25 MG PO TABS: 25.0000 mg | ORAL_TABLET | Freq: Every day | ORAL | 5 refills | Status: DC

## 2016-12-14 NOTE — Progress Notes (Signed)
    Subjective:  Colin Keller is a 61 y.o. male who [facility] with a chief complaint of back pain follow up.   HPI:  Back Pain Patient with chronic back with sciatica for which he takes norco. Was seen in clinic about 2 months ago with an acute worsening related to moving a refrigerator. Since then, his pain is about the same, though it can fluctuate from day to day. He has seen a chiropractor several times which helped some, but has been able to afford further visits. Reports having a plain film which showed degenerative disease. No weakness. No bowel or bladder incontinence. No saddle anesthesia.   T2DM Currently on a regimen of levemir 11units BID,  novolog 5-6 units with breakfast, 5-7 unites with lunch, 7-9 units with dinner. Saw Dr Raymondo BandKoval 6 weeks ago. Sugars have been very labile with lows in the 60 and highs in the 400s. Lows have been relatively asymptomatic. No polyuria or polydipsia.   ROS: Per HPI  PMH: Smoking history reviewed.    Objective:  Physical Exam: BP 122/64   Pulse 70   Temp 98.7 F (37.1 C) (Oral)   Wt 163 lb (73.9 kg)   SpO2 99%   BMI 22.73 kg/m   Gen: NAD, resting comfortably CV: RRR with no murmurs appreciated Pulm: NWOB, CTAB with no crackles, wheezes, or rhonchi MSK: - Back: No deformities. Tender to palpation in lower back. SLR positive.  Skin: warm, dry Neuro: grossly normal, moves all extremities Psych: Normal affect and thought content  Assessment/Plan:  BACK PAIN, LUMBAR Secondary to degenerative disease with lumbar radicular pain. No red flag signs or symptoms. 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 Given the neuropathic nature of his pain, will start amitriptyline 25 mg qhs. Additional month of norco given today. Also discussed back hyperextension exercises. Follow up in 6-8 weeks.   DM (diabetes mellitus), type 2,  uncontrolled (HCC) Continues to have labile CBGs likely secondary to irregular eating schedule. Discussed with Dr Raymondo BandKoval. No changes today, will make an appointment to meet with Dr Raymondo BandKoval within the next week or two.   Katina Degreealeb M. Jimmey RalphParker, MD [facility] Resident PGY-3 12/14/2016 4:35 PM

## 2016-12-14 NOTE — Assessment & Plan Note (Signed)
Secondary to degenerative disease with lumbar radicular pain. No red flag signs or symptoms. 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 Given the neuropathic nature of his pain, will start amitriptyline 25 mg qhs. Additional month of norco given today. Also discussed back hyperextension exercises. Follow up in 6-8 weeks.

## 2016-12-14 NOTE — Patient Instructions (Signed)
Start the elavil 25mg  at night. Do the back exercises.   Come back to see Dr Raymondo BandKoval soon. We will not make any changes today.  Come back to see me in 6-8 weeks.   Take care,  Dr Jimmey RalphParker

## 2016-12-14 NOTE — Assessment & Plan Note (Signed)
Continues to have labile CBGs likely secondary to irregular eating schedule. Discussed with Dr Raymondo BandKoval. No changes today, will make an appointment to meet with Dr Raymondo BandKoval within the next week or two.

## 2016-12-29 ENCOUNTER — Ambulatory Visit: Payer: Self-pay | Admitting: Pharmacist

## 2017-01-05 ENCOUNTER — Ambulatory Visit (INDEPENDENT_AMBULATORY_CARE_PROVIDER_SITE_OTHER): Payer: Self-pay | Admitting: Pharmacist

## 2017-01-05 ENCOUNTER — Encounter: Payer: Self-pay | Admitting: Pharmacist

## 2017-01-05 DIAGNOSIS — M545 Low back pain, unspecified: Secondary | ICD-10-CM

## 2017-01-05 DIAGNOSIS — E1165 Type 2 diabetes mellitus with hyperglycemia: Secondary | ICD-10-CM

## 2017-01-05 DIAGNOSIS — IMO0001 Reserved for inherently not codable concepts without codable children: Secondary | ICD-10-CM

## 2017-01-05 DIAGNOSIS — G8929 Other chronic pain: Secondary | ICD-10-CM

## 2017-01-05 MED ORDER — INSULIN DETEMIR 100 UNIT/ML FLEXPEN: 13.0000 [IU] | PEN_INJECTOR | Freq: Two times a day (BID) | SUBCUTANEOUS | 0 refills | Status: DC

## 2017-01-05 MED ORDER — NORTRIPTYLINE HCL 50 MG PO CAPS: 50.0000 mg | ORAL_CAPSULE | Freq: Every day | ORAL | 2 refills | Status: DC

## 2017-01-05 MED ORDER — INSULIN ASPART 100 UNIT/ML ~~LOC~~ SOLN: 7.0000 [IU] | Freq: Three times a day (TID) | SUBCUTANEOUS | 0 refills | Status: DC

## 2017-01-05 NOTE — Assessment & Plan Note (Signed)
Chronic Low back pain: Patient reports continued back pain throughout the day.  He was recently started on amitriptyline but does report some urinary symptoms that may be anticholinergic in nature such as starting his stream.  Will switch amitriptyline to nortriptyline and increase to 50 mg daily at bedtime.  Reevaluate pain control and sedation effect then consider increasing dose at next visit.

## 2017-01-05 NOTE — Assessment & Plan Note (Signed)
Diabetes longstanding diagnosed currently uncontrolled. Patient reports hypoglycemic events and is able to verbalize appropriate hypoglycemia management plan. Patient reports adherence with medication. Control is suboptimal due to chronic pain and need for insulin titration.  Increased Levemir to 13 units BID and increased Novolog to 7 units TID before meals.  Next A1C anticipated in 2 months.  Instructed patient to call clinic if he experiences more frequent hypoglycemia.  Patient was provided with samples and instructed to continue checking his CBGs at least twice daily

## 2017-01-05 NOTE — Patient Instructions (Addendum)
Stop Amitriptyline and switch to Nortriptyline 50 mg daily at bedtime  Increase Levemir to 13 units twice daily Increase Novolog to 7 units three times daily before meals.    Please call clinic if you have signs/symptoms of frequent low blood glucose  Followup with Dr Raymondo BandKoval in 5 weeks.

## 2017-01-05 NOTE — Progress Notes (Signed)
    S:    Patient arrives in good spirits ambulating without assistance but complaining of chronic lower back pain related to his sciatic nerve.  Presents for diabetes evaluation, education, and management. Patient was last seen by Dr Jimmey RalphParker on 12/14/16 and last seen by pharmacy clinic on 10/28/16.   Patient reports adherence with medications.  Current diabetes medications include: Patient states he is almost out of insulin but is taking Novolog 6 units TID and Levemir 11 units BID Current hypertension medications include:   Patient reports 2 hypoglycemic events with BG reading of 61 and 2 readings in the 70s. Patient reports the 5361 occurred when he didn't much dinner or have a snack. When patient starts feeling low, he will eat a muffin.   Patient reported dietary habits: Eats 3 meals/day Lunch: Occasionally skips or eats something small like a pack of crackers  Patient is having a lot of inactivity due to recent back injury. Endorses significant amount of pain from this injury. Has been out of work due to this. Patient has tried cyclobenzaprine in the past but does not like the way it makes him feel. Doesn't feel like Tylenol works for any pain he has had. Has been told not to take ibuprofen due to kidney function.  Patient is smoking a pack per day. Patient reports delay tactics, trying to wait before he smokes another cigarette. However, has been smoking a lot recently due to the stress in his life/   O:  Lab Results  Component Value Date   HGBA1C 10.2 10/28/2016   Vitals:   01/05/17 1532  BP: (!) 148/80  Pulse: 61   Home CBGs  Fasting: mostly in 200-300s 2 hr post dinner: mostly in 200s.   A/P: Diabetes longstanding diagnosed currently uncontrolled. Patient reports hypoglycemic events and is able to verbalize appropriate hypoglycemia management plan. Patient reports adherence with medication. Control is suboptimal due to chronic pain and need for insulin titration.  Increased  Levemir to 13 units BID and increased Novolog to 7 units TID before meals.  Next A1C anticipated in 2 months.  Instructed patient to call clinic if he experiences more frequent hypoglycemia.  Patient was provided with samples and instructed to continue checking his CBGs at least twice daily   ASCVD risk greater than 7.5%. Continued Aspirin 81 mg and Continued pravastatin 20 mg.   Hypertension longstanding diagnosed currently uncontrolled but likely due to chronic pain todya.  Patient reports adherence with medication. Control is suboptimal due to chronic pain.  Will continue to monitor at future visits.   Chronic Low back pain: Patient reports continued back pain throughout the day.  He was recently started on amitriptyline but does report some urinary symptoms that may be anticholinergic in nature such as starting his stream.  Will switch amitriptyline to nortriptyline and increase to 50 mg daily at bedtime.    Written patient instructions provided.  Total time in face to face counseling 35 minutes.   Follow up in Pharmacist Clinic Visit in 5 weeks.   Patient seen with Hazle NordmannKelsy Combs PharmD, BCPS and Crista CurbLaura Fritts, PharmD candidate.

## 2017-01-06 NOTE — Progress Notes (Signed)
Patient ID: Colin KaufmanDavid W Keller, male   DOB: 09/06/1956, 61 y.o.   MRN: [id number] Reviewed: I agree with Dr. Macky LowerKoval's documentation and management.

## 2017-01-10 ENCOUNTER — Other Ambulatory Visit: Payer: Self-pay | Admitting: Family Medicine

## 2017-01-10 DIAGNOSIS — M545 Low back pain, unspecified: Secondary | ICD-10-CM

## 2017-01-10 NOTE — Telephone Encounter (Signed)
Pt called and would like to have a refill on his pain medication left up front. He will be in town today and tomorrow. Please call when ready to pick up. jw

## 2017-01-11 MED ORDER — HYDROCODONE-ACETAMINOPHEN 10-325 MG PO TABS: 1.0000 | ORAL_TABLET | Freq: Three times a day (TID) | ORAL | 0 refills | Status: DC | PRN

## 2017-01-11 NOTE — Telephone Encounter (Signed)
Patient is aware of this refill and will come by and pick up tomorrow. Terianna Peggs,CMA

## 2017-01-11 NOTE — Telephone Encounter (Signed)
Pt called again about his pain medication . [place] in [place] now and will be heading to [place] soon. Please call pt when it is ready for pickup

## 2017-01-11 NOTE — Telephone Encounter (Signed)
Will forward to MD to advise. Asuna Peth,CMA  

## 2017-01-11 NOTE — Telephone Encounter (Signed)
Rx filled and left at front of office.  Colin Keller M. Colin RalphParker, MD [facility] Resident PGY-3 01/11/2017 4:54 PM

## 2017-02-06 ENCOUNTER — Other Ambulatory Visit: Payer: Self-pay | Admitting: Family Medicine

## 2017-02-06 DIAGNOSIS — M545 Low back pain, unspecified: Secondary | ICD-10-CM

## 2017-02-06 NOTE — Telephone Encounter (Signed)
Pt needs a refill on norco and would like to pick it up this week. Please call pt when it is ready to be picked up. ep

## 2017-02-08 MED ORDER — HYDROCODONE-ACETAMINOPHEN 10-325 MG PO TABS: 1.0000 | ORAL_TABLET | Freq: Three times a day (TID) | ORAL | 0 refills | Status: DC | PRN

## 2017-02-08 NOTE — Telephone Encounter (Signed)
Patient is aware that script is ready for pick up. Jazmin Hartsell,CMA  

## 2017-02-08 NOTE — Telephone Encounter (Signed)
Rx filled and left at front of office.  Colin Degreealeb M. Jimmey RalphParker, MD [facility] Resident PGY-3 02/08/2017 1:41 PM

## 2017-02-08 NOTE — Telephone Encounter (Signed)
Wife called again about refill. Pt is in town today and would like to pick it up so he can refill it tomorrow.  Cell phone number is [phone]

## 2017-02-20 ENCOUNTER — Ambulatory Visit: Payer: Self-pay | Admitting: Family Medicine

## 2017-02-21 ENCOUNTER — Ambulatory Visit: Payer: Self-pay | Admitting: Internal Medicine

## 2017-02-24 ENCOUNTER — Encounter: Payer: Self-pay | Admitting: Pharmacist

## 2017-02-24 ENCOUNTER — Ambulatory Visit (INDEPENDENT_AMBULATORY_CARE_PROVIDER_SITE_OTHER): Payer: Self-pay | Admitting: Pharmacist

## 2017-02-24 DIAGNOSIS — G8929 Other chronic pain: Secondary | ICD-10-CM

## 2017-02-24 DIAGNOSIS — IMO0001 Reserved for inherently not codable concepts without codable children: Secondary | ICD-10-CM

## 2017-02-24 DIAGNOSIS — G4701 Insomnia due to medical condition: Secondary | ICD-10-CM

## 2017-02-24 DIAGNOSIS — M25511 Pain in right shoulder: Secondary | ICD-10-CM

## 2017-02-24 DIAGNOSIS — E1165 Type 2 diabetes mellitus with hyperglycemia: Secondary | ICD-10-CM

## 2017-02-24 MED ORDER — INSULIN DETEMIR 100 UNIT/ML FLEXPEN: 13.0000 [IU] | PEN_INJECTOR | Freq: Two times a day (BID) | SUBCUTANEOUS | 0 refills | Status: DC

## 2017-02-24 MED ORDER — INSULIN DETEMIR 100 UNIT/ML ~~LOC~~ SOLN: 12.0000 [IU] | Freq: Two times a day (BID) | SUBCUTANEOUS | 11 refills | Status: DC

## 2017-02-24 NOTE — Progress Notes (Signed)
Patient ID: Colin Keller, male   DOB: 10/24/1956, 60 y.o.   MRN: [id number] Reviewed: Agree with Dr. Koval's documentation and management. 

## 2017-02-24 NOTE — Assessment & Plan Note (Signed)
Diabetes longstanding currently poorly controlled with A1c of 10.2 in 10/2016 however he has minimal hypoglycemic events (#1) since last visit which is much improved.  He is able to verbalize appropriate hypoglycemia management plan. Patient reports adherence with insulin with exception of occasional missing of PM Levemir when PM CBG is <140.   Control is suboptimal due to unoptimized regimen, unable to afford newer expensive agents (he is cash pay currently/continue to wait on pending [facility] coverage).  Patient was advised to not skip his PM long acting dose of insulin to lower chance of high AM CBGs.  Continue Levemir 12 units BID and Novolog 6 units with breakfast and 7 units with lunch and dinner. Goal is to see more CBGs in the 100s with the occasional 200.

## 2017-02-24 NOTE — Assessment & Plan Note (Signed)
Chronic Pain/Insomnia improved and in fair control with addition of nortriptyline. Tolerating Nortriptyline 50 mg QHS. We discussed increasing nortriptyline dose but due to slowing of unrinary flow, remain at 50 mg dose. Prior treatment with Flomax 10-12 years ago provided notable symptom relief.  Consider switch from nortriptyline to duloxetine for pain management in the future if [facility] covers this option.

## 2017-02-24 NOTE — Assessment & Plan Note (Signed)
Improved and in fair control with addition of nortriptyline, now sleeping 5 hours. Tolerating Nortriptyline 50 mg QHS. We discussed increasing nortriptyline dose but due to slowing of unrinary flow, remain at 50 mg dose.

## 2017-02-24 NOTE — Progress Notes (Signed)
S:     Chief Complaint  Patient presents with  . Medication Management    Diabetes    Patient arrives in good spirits ambulating without difficulty.  Presents for diabetes evaluation, education, and management. Patient was last seen by Primary Care Provider Dr. Jimmey RalphParker on 12/14/2016 and last seen by Pharmacy clinic on 01/05/2017.  Patients believes that his dietary choices may be negatively affecting his SBGs. He also thinks that work and day-to-day stresses contribute to variable control. Patient admits he may skip his lunch Novolog if he works through lunch. He reports consistently taking dinner Novolog dose. He admits skipping PM Levemir dose if he thinks he is too low.  Patient reports he smokes 12-15 [employer] every day. Patient feels that he is more ready to quit than previously. He has make strides such as quitting for the day after work and acknowledged his feeling of craving that went with it.   Family/Social History:   Patient reports adherence with medications.  Current diabetes medications include: Metformin 1000 mg BID, Insulin Levemir 12 units BID, Insulin Novolog 6 units qAM and 7 units every night with dinner, most days 7 units with lunch. No lunch dose if lunch is skipped. Current hypertension medications include: Lisinopril/HCTZ 20/12.5 mg daily  Patient reports a single low reading since last visit. He described his ypoglycemic event as mild, without fall, felt shaky and blurred vision.  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  Patient reports neuropathy. Especially at night when he is laying down after being on his feet all day. Patient reports visual changes associated with high and low blood sugars. Patient denies self foot exams.   O:  Physical Exam   ROS    Lab Results  Component Value Date    HGBA1C 10.2 10/28/2016   There were no vitals filed for this visit.  Home fasting CBG: 140-150 (Lowest AM level was 70, pt ate a light dinner before) 2 hour post-prandial/random CBG: 200s/300s  10 year ASCVD risk: >7.5%  A/P: Diabetes longstanding currently poorly controlled with A1c of 10.2 in 10/2016 however he has minimal hypoglycemic events (#1) since last visit which is much improved.  He is able to verbalize appropriate hypoglycemia management plan. Patient reports adherence with insulin with exception of occasional missing of PM Levemir when PM CBG is <140.   Control is suboptimal due to unoptimized regimen, unable to afford newer expensive agents (he is cash pay currently/continue to wait on pending VA coverage).  Patient was advised to not skip his PM long acting dose of insulin to lower chance of high AM CBGs.  Continue Levemir 12 units BID and Novolog 6 units with breakfast and 7 units with lunch and dinner. Goal is to see more CBGs in the 100s with the occasional 200.   Next A1C anticipated dependent on [place] [facility] [place] [facility] [place]. Last A1c was 10/2016.    ASCVD risk greater than 7.5%. Continued Aspirin 81 mg and Continued pravastatin 20 mg 2-3 times weekly.   Hypertension longstanding currently well controlled with a BP of 132/68 today in office.  Patient reports adherence with medication.  Chronic Pain/Insomnia improved and in fair control with addition of nortriptyline. Tolerating Nortriptyline 50 mg QHS. We discussed increasing nortriptyline dose but due to slowing of unrinary flow,  remain at 50 mg dose. Prior treatment with Flomax 10-12 years ago provided notable symptom relief.  Consider switch from nortriptyline to duloxetine for pain management in the future if VA covers this option.   Written patient instructions provided.  Total time in face to face counseling 25 minutes.   Follow up in Pharmacist Clinic Visit in 6 weeks.   Patient seen with Elta Guadeloupe, PharmD Candidate, [place] Breeze, PharmD Candidate.

## 2017-02-24 NOTE — Patient Instructions (Addendum)
Thank you for coming to see us today! We did not make any changes to your insulin dosing today. Aim for sugars in the 100s. Do not skip for nighttime dose of Levemir. Plan on seeing us again in about six weeks.

## 2017-03-07 ENCOUNTER — Encounter: Payer: Self-pay | Admitting: Family Medicine

## 2017-03-07 ENCOUNTER — Ambulatory Visit (INDEPENDENT_AMBULATORY_CARE_PROVIDER_SITE_OTHER): Payer: Self-pay | Admitting: Family Medicine

## 2017-03-07 VITALS — BP 128/60 | HR 65 | Temp 98.1°F | Wt 162.8 lb

## 2017-03-07 DIAGNOSIS — E1142 Type 2 diabetes mellitus with diabetic polyneuropathy: Secondary | ICD-10-CM

## 2017-03-07 DIAGNOSIS — E785 Hyperlipidemia, unspecified: Secondary | ICD-10-CM

## 2017-03-07 DIAGNOSIS — I10 Essential (primary) hypertension: Secondary | ICD-10-CM

## 2017-03-07 DIAGNOSIS — G8929 Other chronic pain: Secondary | ICD-10-CM

## 2017-03-07 DIAGNOSIS — M545 Low back pain, unspecified: Secondary | ICD-10-CM

## 2017-03-07 DIAGNOSIS — Z794 Long term (current) use of insulin: Secondary | ICD-10-CM

## 2017-03-07 DIAGNOSIS — IMO0002 Reserved for concepts with insufficient information to code with codable children: Secondary | ICD-10-CM

## 2017-03-07 DIAGNOSIS — E1165 Type 2 diabetes mellitus with hyperglycemia: Secondary | ICD-10-CM

## 2017-03-07 LAB — POCT GLYCOSYLATED HEMOGLOBIN (HGB A1C): HEMOGLOBIN A1C: 10.9

## 2017-03-07 MED ORDER — METFORMIN HCL 1000 MG PO TABS: 1000.0000 mg | ORAL_TABLET | Freq: Two times a day (BID) | ORAL | 3 refills | Status: DC

## 2017-03-07 MED ORDER — NORTRIPTYLINE HCL 50 MG PO CAPS: 50.0000 mg | ORAL_CAPSULE | Freq: Every day | ORAL | 2 refills | Status: DC

## 2017-03-07 MED ORDER — LISINOPRIL-HYDROCHLOROTHIAZIDE 20-12.5 MG PO TABS: 1.0000 | ORAL_TABLET | Freq: Every day | ORAL | 3 refills | Status: DC

## 2017-03-07 MED ORDER — HYDROCODONE-ACETAMINOPHEN 10-325 MG PO TABS: 1.0000 | ORAL_TABLET | Freq: Three times a day (TID) | ORAL | 0 refills | Status: DC | PRN

## 2017-03-07 MED ORDER — METOPROLOL TARTRATE 50 MG PO TABS: 25.0000 mg | ORAL_TABLET | Freq: Two times a day (BID) | ORAL | 3 refills | Status: DC

## 2017-03-07 MED ORDER — PRAVASTATIN SODIUM 20 MG PO TABS: 20.0000 mg | ORAL_TABLET | ORAL | 2 refills | Status: DC

## 2017-03-07 NOTE — Assessment & Plan Note (Signed)
At goal on lisinopril-HCTZ and metoprolol. Will continue.

## 2017-03-07 NOTE — Assessment & Plan Note (Addendum)
Pain improving some, though is still having some functional limitations. Refilled 3 month supply of Norco today. Continue pamelor. [place] to possible [place] [facility] or see a specialist. Database reviewed today without red flags.

## 2017-03-07 NOTE — Assessment & Plan Note (Signed)
A1c elevated to 10.9 today. Adherent with his current regimen. Discussed increasing doses, however patient deferred today. Stated that he had a new job and that he was going to be much more active. Asked patient to come back and discuss with Dr Raymondo BandKoval within the next week or two.

## 2017-03-07 NOTE — Progress Notes (Signed)
    Subjective:  Colin KaufmanDavid W Keller is a 61 y.o. male who [facility] today with a chief complaint of back pain.   HPI:  Back Pain Chronic problem. Current regimen includes nortriptyline daily and norco every 8 hours as needed. Since his last visit, his pain is somewhat improved. He is still having difficulty getting out of bed. He is waiting to hear back [facility] [place] about getting an [facility] and possible seeing a specialist there. No weakness. No bowel or bladder incontinence. No saddle anesthesia. Medications improve pain and ability to function. No constipation or other mediation side effects noted.   T2DM Currently on a regimen of levemir 12 units daily and novolog 6 units with breakfast 7 units with lunch and dinner. Thinks that his sugars are getting better. He has had a few values in the 400+ range which attributes to dietary indiscretions. He has been compliant with his insulin regimen. Denies any symptomatic lows. Last saw Dr Colin Keller 2 weeks ago - no changes were made at that visit. No polyuria or polydipsia.   Hypertension BP Readings from Last 3 Encounters:  03/07/17 128/60  02/24/17 132/68  01/05/17 (!) 148/80   Home BP monitoring-Yes Compliant with medications-yes without side effects ROS-Denies any CP, HA, SOB, blurry vision, LE edema, transient weakness, orthopnea, PND.   ROS: Per HPI  PMH: Smoking history reviewed.    Objective:  Physical Exam: BP 128/60   Pulse 65   Temp 98.1 F (36.7 C) (Oral)   Wt 162 lb 12.8 oz (73.8 kg)   SpO2 98%   BMI 22.71 kg/m   Gen: NAD, resting comfortably CV: RRR with no murmurs appreciated Pulm: NWOB, CTAB with no crackles, wheezes, or rhonchi MSK: - Back: No deformities. Tender to palpation in lower back.  Skin: warm, dry Neuro: grossly normal, moves all extremities Psych: Normal affect and thought content  Results for orders placed or performed in visit on 03/07/17 (from the past 72 hour(s))  HgB A1c     Status: Abnormal   Collection Time: 03/07/17  3:48 PM  Result Value Ref Range   Hemoglobin A1C 10.9    Assessment/Plan:  Encounter for chronic pain management Pain improving some, though is still having some functional limitations. Refilled 3 month supply of Norco today. Continue pamelor. [place] [place] or see a specialist. Database reviewed today without red flags.   DM (diabetes mellitus), type 2, uncontrolled (HCC) A1c elevated to 10.9 today. Adherent with his current regimen. Discussed increasing doses, however patient deferred today. Stated that he had a new job and that he was going to be much more active. Asked patient to come back and discuss with Dr Colin Keller within the next week or two.   HYPERTENSION, BENIGN At goal on lisinopril-HCTZ and metoprolol. Will continue.   Colin Degreealeb M. Jimmey RalphParker, MD [facility] Resident PGY-3 03/07/2017 5:01 PM

## 2017-03-07 NOTE — Patient Instructions (Signed)
We will not make any changes today.  Please let me know the results of your MRI.  Come back to see me in 3 months.  Come back to see Dr Raymondo BandKoval in a few weeks.  Take care,  Dr Jimmey RalphParker

## 2017-05-18 ENCOUNTER — Encounter: Payer: Self-pay | Admitting: Family Medicine

## 2017-05-18 ENCOUNTER — Ambulatory Visit (INDEPENDENT_AMBULATORY_CARE_PROVIDER_SITE_OTHER): Payer: Self-pay | Admitting: Family Medicine

## 2017-05-18 ENCOUNTER — Ambulatory Visit (INDEPENDENT_AMBULATORY_CARE_PROVIDER_SITE_OTHER): Payer: Self-pay | Admitting: Pharmacist

## 2017-05-18 ENCOUNTER — Encounter: Payer: Self-pay | Admitting: Pharmacist

## 2017-05-18 DIAGNOSIS — E785 Hyperlipidemia, unspecified: Secondary | ICD-10-CM

## 2017-05-18 DIAGNOSIS — F17200 Nicotine dependence, unspecified, uncomplicated: Secondary | ICD-10-CM

## 2017-05-18 DIAGNOSIS — IMO0002 Reserved for concepts with insufficient information to code with codable children: Secondary | ICD-10-CM

## 2017-05-18 DIAGNOSIS — E1142 Type 2 diabetes mellitus with diabetic polyneuropathy: Secondary | ICD-10-CM

## 2017-05-18 DIAGNOSIS — E1165 Type 2 diabetes mellitus with hyperglycemia: Secondary | ICD-10-CM

## 2017-05-18 DIAGNOSIS — M545 Low back pain: Secondary | ICD-10-CM

## 2017-05-18 DIAGNOSIS — G8929 Other chronic pain: Secondary | ICD-10-CM

## 2017-05-18 DIAGNOSIS — Z794 Long term (current) use of insulin: Secondary | ICD-10-CM

## 2017-05-18 DIAGNOSIS — I10 Essential (primary) hypertension: Secondary | ICD-10-CM

## 2017-05-18 MED ORDER — NORTRIPTYLINE HCL 50 MG PO CAPS: 50.0000 mg | ORAL_CAPSULE | Freq: Every day | ORAL | 5 refills | Status: DC

## 2017-05-18 NOTE — Assessment & Plan Note (Signed)
Continue pravastatin 3 times weekly. Due for lipid panel in 2 months.

## 2017-05-18 NOTE — Assessment & Plan Note (Signed)
ASCVD risk greater than 7.5%. Continued Aspirin 81 mg once daily and Continued pravastatin 20 mg TIW.

## 2017-05-18 NOTE — Progress Notes (Signed)
    Subjective:  Colin KaufmanDavid W Keller is a 61 y.o. male who [facility] with a chief complaint of TD2M.   HPI:  T2DM Patient currently on levemir 12 units daily, novolog 6-7 units daily, and metformin 1000mg  bid. Sugars have generally been in the 90-130 range. Occasionally has AM lows. No polyuria or polydipsia.   Back Pain Chronic, stable. With sciatica. Currently on norco 10-325 and pamelor. No bowel or bladder incontinence. Meds help with his ability to work and function.   HLD Tolerating pravastatin every other day without side effects.  Hypertension BP Readings from Last 3 Encounters:  05/18/17 (!) 180/80  05/18/17 (!) 156/60  03/07/17 128/60   Home BP monitoring-Yes Compliant with medications-yes, without side effects ROS-Denies any CP, HA, SOB, blurry vision, LE edema, transient weakness, orthopnea, PND.   ROS: Per HPI  PMH: Smoking history reviewed.    Objective:  Physical Exam: BP (!) 156/60   Pulse 67   Temp 97.8 F (36.6 C) (Oral)   Ht 5\' 11"  (1.803 m)   Wt 157 lb 9.6 oz (71.5 kg)   SpO2 99%   BMI 21.98 kg/m   Gen: NAD, resting comfortably CV: RRR with no murmurs appreciated Pulm: NWOB, CTAB with no crackles, wheezes, or rhonchi MSK:  - Back: No deformities.  Skin: warm, dry Neuro: grossly normal, moves all extremities Psych: Normal affect and thought content  Assessment/Plan:  DM (diabetes mellitus), type 2, uncontrolled (HCC) Reported CBGs seem to be much better. No changes to metformin or insulin regimen today. Follow up in 1 month for repeat A1c. Please also see today's note from Dr Raymondo BandKoval.   HYPERTENSION, BENIGN Above goal today, though has typically been well controlled. No changes to medications today. 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   Hyperlipidemia Continue pravastatin 3 times weekly. Due for lipid panel in 2 months.   Encounter for chronic pain  management Stable. Has refills of norco. Pamelor sent in. Patient's options are limited due to lack of insurance. He is still waiting to hear [facility] [place] about [facility].   Katina Degreealeb M. Jimmey RalphParker, MD [facility] Resident PGY-3 05/18/2017 9:48 AM

## 2017-05-18 NOTE — Assessment & Plan Note (Signed)
Stable. Has refills of norco. Pamelor sent in. Patient's options are limited due to lack of insurance. He is still waiting [place] [facility] [place] about [facility].

## 2017-05-18 NOTE — Assessment & Plan Note (Signed)
Diabetes longstanding currently uncontrolled with A1C 10.9 (03/07/2017). Patient denies hypoglycemic events and is able to verbalize appropriate hypoglycemia management plan. Patient reports adherence with medication. Control is suboptimal due to suboptimal insulin dosing. No medication changes today. Continue metformin 1000 mg BID, insulin detemir 11-12 units BID (11 when BG<200, 12 when BG>200), and insulin aspart 6-7 units (6 when BG<200, 7 when BG>200) as described above. Next A1C anticipated in 1 month (06/2017).  Pt has ~1 week of insulin left as of today. We are currently out of insulin samples, and will contact him and follow up within the next week.

## 2017-05-18 NOTE — Assessment & Plan Note (Signed)
Smoking cessation: pt reports currently smoking 0.75  ppd. Pt reports stress from job and habit as barriers to quitting, with habit being the largest factor. Pt reports "getting sick of smoking," only buying one pack at a time, and only smoking half of the cigarette and throw the remaining away. Pt reports quitting x4.5 months when grandchild was born, but started back again because of son. Pt was counseled to continue efforts to cut back on smoking. Pharmacotherapy can be considered at a later visit once pt feels more ready to quit.

## 2017-05-18 NOTE — Patient Instructions (Signed)
Great to see you today.   No change in medications today.  Consider trials OFF of flexeril (cyclobenzaprine) and nortriptyline to identify if one OR both are worsening urinary symptoms.   Continue your efforts to cut back on smoking.   I will call you when insulin arrives within the week.

## 2017-05-18 NOTE — Assessment & Plan Note (Signed)
Pain/sleep: pt reports taking nortriptyline (due to intolerance of urinary retention with amitriptyline) once daily and cyclobenzaprine occasionally for muscle spasms. He reports grogginess in the AM and worsening urinary retention while taking both medications. Pt was counseled to take 3-5 days off/alternating break with nortriptyline and cyclobenzaprine to identify if one OR both medications are worsening urinary retention. Low dose doxepin (10 mg) can be considered as an alternative to nortriptyline at a later visit if urinary retention continues to worsen.

## 2017-05-18 NOTE — Patient Instructions (Signed)
No changes today.  Come back in 1 month to meet your new doctor and recheck blood work.  Take care,  Dr Jimmey RalphParker

## 2017-05-18 NOTE — Assessment & Plan Note (Addendum)
Reported CBGs seem to be much better. No changes to metformin or insulin regimen today. Follow up in 1 month for repeat A1c. Please also see today's note from Dr Raymondo BandKoval.

## 2017-05-18 NOTE — Assessment & Plan Note (Signed)
Above goal today, though has typically been well controlled. No changes to medications today. 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

## 2017-05-18 NOTE — Progress Notes (Signed)
Patient ID: Colin Keller, male   DOB: 09/14/1956, 60 y.o.   MRN: [id number] Reviewed: Agree with Dr. Koval's documentation and management. 

## 2017-05-18 NOTE — Progress Notes (Signed)
S:     Chief Complaint  Patient presents with  . Medication Management    Diabetes, tobacco    Patient arrives in a good mood and ambulating without assistance.  Presents for diabetes evaluation, education, and management at the request of Dr. Jimmey Ralph. Patient was referred on 03/07/2017.  Patient was last seen by Primary Care Provider on 03/07/2017 and has seen Dr. Jimmey Ralph today.  Patient reports Diabetes was diagnosed in 2013.   Family/Social History: Pt is not active with [facility]. They have him down as reserve, but he served x4 yrs. They are working through paperwork. He still has no other coverage.   Patient reports adherence with medications.  Current diabetes medications include: metformin 1000mg  BID, insulin detemir 11-12 units BID (11 when BG<200, 12 when BG>200), and insulin aspart 6-7 units (6 when BG<200, 7 when BG>200). pt reports recently only using 5 units of insulin aspart. Current hypertension medications include: lisinopril-hctz 20-12.5 mg once daily, metoprolol tartrate 25 mg BID  Patient denies hypoglycemic events. 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   Review of Systems  Musculoskeletal: Positive for back pain.  All other systems reviewed and are negative. Chronic back pain is slighly improved per patient report.   Lab Results  Component Value Date   HGBA1C 10.9 03/07/2017   Vitals:   05/18/17 0911  BP: (!) 180/80  Pulse: 60  Temp: 97.8 F (36.6 C)    Home fasting CBG: 90s-130s (high of 158)  10 year ASCVD risk: 43.6% (based on lipid panel from 07/2016 and today's BP)  A/P: Diabetes longstanding currently uncontrolled with A1C 10.9 (03/07/2017). Patient denies hypoglycemic events and is able to verbalize appropriate hypoglycemia management plan. Patient reports adherence with medication.  Control is suboptimal due to suboptimal insulin dosing. No medication changes today. Continue metformin 1000 mg BID, insulin detemir 11-12 units BID (11 when BG<200, 12 when BG>200), and insulin aspart 6-7 units (6 when BG<200, 7 when BG>200) as described above. Next A1C anticipated in 1 month (06/2017).  Pt has ~1 week of insulin left as of today. We are currently out of insulin samples, and will contact him and follow up within the next week.  ASCVD risk greater than 7.5%. Continued Aspirin 81 mg once daily and Continued pravastatin 20 mg TIW.   Hypertension longstanding currently uncontrolled with BP 180/80 today.  Patient reports adherence with medication. Control is suboptimal due to possible white coat HTN and suboptimal dosing. No medication changes today.   Smoking cessation: pt reports currently smoking 0.75  ppd. Pt reports stress from job and habit as barriers to quitting, with habit being the largest factor. Pt reports "getting sick of smoking," only buying one pack at a time, and only smoking half of the cigarette and throw the remaining away. Pt reports quitting x4.5 months when grandchild was born, but started back again because of son. Pt was counseled to continue efforts to cut back on smoking. Pharmacotherapy can be considered at a later visit once pt feels more ready to quit.  Pain/sleep: pt reports taking nortriptyline (due to intolerance of urinary retention with amitriptyline) once daily and cyclobenzaprine occasionally for muscle spasms. He reports grogginess in the AM and worsening urinary retention while taking both medications. Pt was counseled to take 3-5 days off/alternating break with nortriptyline and cyclobenzaprine to identify if one OR both  medications are worsening urinary retention. Low dose doxepin (10 mg) can be considered as an alternative to nortriptyline at a later visit if urinary retention continues to worsen.  Written patient instructions provided.  Total time in  face to face counseling 30 minutes.   Follow up in Pharmacist Clinic Visit 1 month. Patient seen with Katherine MantleHeysel Lam, PharmD Candidate, and Lissa HoardJoe Arminger, PharmD PGY-1 Resident.

## 2017-05-25 ENCOUNTER — Telehealth: Payer: Self-pay | Admitting: Family Medicine

## 2017-05-25 NOTE — Telephone Encounter (Signed)
He could not get message and was coming to pick up insulin samples but they are not up front yet.  Please try to call again at # [phone]

## 2017-05-26 ENCOUNTER — Telehealth: Payer: Self-pay | Admitting: Pharmacist

## 2017-05-26 NOTE — Telephone Encounter (Signed)
Medication Samples have been provided to the patient.  Drug name: Novolog       Strength: 100units per ml        Qty: 2 vials  LOT: [id number]  Exp.Date: 01/11/2019  Dosing instructions: 7 tid with meals  The patient has been instructed regarding the correct time, dose, and frequency of taking this medication, including desired effects and most common side effects.   Kathrin RuddyKoval, Peter G 9:10 AM 05/26/2017   Medication Samples have been provided to the patient.  Drug name: Levemir  Qty: 2 Vials  LOT: [id number]  Exp.Date: 04/10/2018  Instructions: 12 BID  The patient has been instructed regarding the correct time, dose, and frequency of taking this medication, including desired effects and most common side effects.   Kathrin RuddyKoval, Peter G 9:11 AM 05/26/2017   Left for pick-up in refrigerator.

## 2017-06-02 ENCOUNTER — Other Ambulatory Visit: Payer: Self-pay | Admitting: Family Medicine

## 2017-06-02 ENCOUNTER — Telehealth: Payer: Self-pay | Admitting: Internal Medicine

## 2017-06-02 DIAGNOSIS — M545 Low back pain, unspecified: Secondary | ICD-10-CM

## 2017-06-02 NOTE — Telephone Encounter (Signed)
[facility] After Hours Tele[facility]   Person calling: Juline PatchLori Saintil Relationship to patient: Wife  Reason for call: Pain medication  Patient's wife calling regarding patient's Norco. Says that she called this afternoon asking for a refill for her husband because his prescription runs out tomorrow. Explained to patient's wife that this is the emergency after hours line, and we do not refill medications, especially not narcotics. Patient's wife said she would call back on Monday morning to request prescription again. Assured her that prescription request was sent to patient's PCP and would be addressed in a timely manner.   Tarri AbernethyAbigail J Lancaster, MD, MPH PGY-2 [facility] Pager [phone]

## 2017-06-02 NOTE — Telephone Encounter (Signed)
Pt called because he needs a refill on his pain medication. He needs this today since it is due to be refilled over the weekend.Please call when ready to pick up. jw

## 2017-06-05 ENCOUNTER — Other Ambulatory Visit: Payer: Self-pay | Admitting: Family Medicine

## 2017-06-05 DIAGNOSIS — M545 Low back pain, unspecified: Secondary | ICD-10-CM

## 2017-06-05 MED ORDER — HYDROCODONE-ACETAMINOPHEN 10-325 MG PO TABS: 1.0000 | ORAL_TABLET | Freq: Three times a day (TID) | ORAL | 0 refills | Status: DC | PRN

## 2017-06-05 NOTE — Progress Notes (Signed)
LM for patient that script is ready for pick up. Meilyn Heindl,CMA  

## 2017-06-05 NOTE — Telephone Encounter (Signed)
Patient came by office to inquire if RX for pain med has filled yet. Please let patient know [phone] Patient stated his wife has called twice in regards to getting this filled.

## 2017-06-05 NOTE — Telephone Encounter (Signed)
3rd request. ep °

## 2017-06-05 NOTE — Progress Notes (Signed)
Rx filled and left up front.  Katina Degreealeb M. Jimmey RalphParker, MD [facility] Resident PGY-3 06/05/2017 1:30 PM

## 2017-06-21 LAB — HM DIABETES EYE EXAM

## 2017-06-30 ENCOUNTER — Ambulatory Visit: Payer: Self-pay | Admitting: Family Medicine

## 2017-07-10 ENCOUNTER — Ambulatory Visit (INDEPENDENT_AMBULATORY_CARE_PROVIDER_SITE_OTHER): Payer: Self-pay | Admitting: Family Medicine

## 2017-07-10 VITALS — BP 106/62 | HR 64 | Temp 98.1°F | Ht 71.0 in | Wt 154.0 lb

## 2017-07-10 DIAGNOSIS — G8929 Other chronic pain: Secondary | ICD-10-CM

## 2017-07-10 DIAGNOSIS — IMO0002 Reserved for concepts with insufficient information to code with codable children: Secondary | ICD-10-CM

## 2017-07-10 DIAGNOSIS — M545 Low back pain, unspecified: Secondary | ICD-10-CM

## 2017-07-10 DIAGNOSIS — F17200 Nicotine dependence, unspecified, uncomplicated: Secondary | ICD-10-CM

## 2017-07-10 DIAGNOSIS — E1142 Type 2 diabetes mellitus with diabetic polyneuropathy: Secondary | ICD-10-CM

## 2017-07-10 DIAGNOSIS — E1165 Type 2 diabetes mellitus with hyperglycemia: Secondary | ICD-10-CM

## 2017-07-10 DIAGNOSIS — Z794 Long term (current) use of insulin: Secondary | ICD-10-CM

## 2017-07-10 LAB — POCT GLYCOSYLATED HEMOGLOBIN (HGB A1C): HEMOGLOBIN A1C: 10.2

## 2017-07-10 MED ORDER — CYCLOBENZAPRINE HCL 5 MG PO TABS: ORAL_TABLET | ORAL | 0 refills | Status: DC

## 2017-07-10 NOTE — Patient Instructions (Signed)
It was good to see you today!  For your diabetes, - Continue to work with Dr. Raymondo BandKoval on your insulin regimen  For your chronic back pain - Let me know when you need more refills - I have sent you the 5mg  pills of the flexeril so you won't have to cut those pills in half.    Please check-out at the front desk before leaving the clinic. Make an appointment with Dr Raymondo BandKoval in the next 1-2 weeks with blood sugar logs. We will shoot for an appointment with me maybe 1 month after that depending on how things go.  Sign up for My Chart to have easy access to your labs results, and communication with your primary care physician.  Feel free to call with any questions or concerns at any time, at [phone].   Take care,  Dr. Leland HerElsia J Tacari Repass, DO [facility]

## 2017-07-10 NOTE — Progress Notes (Signed)
    Subjective:  Colin Keller is a 61 y.o. male who [facility] for diabetes follow up  HPI:  Diabetes - Last saw Dr Raymondo BandKoval in pharm clinic on 05/18/17. States has been seeing regularly ato work on better blood sugar control with his insulin regimen - Has seen some improvement with improvement in morning fastings avg 150s, highest in 200s, lowest was 80s - States most improvement in nighttime readings, was previously in high 300s  - Polyuria- none, New Visual problems- none, Hypoglycemic symptoms- none  - on metformin,  levemir and novolog. Compliant, tolerating well  Chronic back pain - has R sided sciatica which has been stable but persistent since Oct 2017  - walks with walking stick, no falls - no urinary or bowel incontinence - taking norco and flexeril as needed. Has been cutting flexeril 10mg  tablets in half because felt full dose made him groggy - still waiting [facility] approval to be seen there - States has been talking to Dr Raymondo BandKoval about some of his medication causing urinary retention.  - Per chart review, taking nortriptylline because of intolerance of amitriptyline for urinary retention.  Was counseled on alternating and taking medication breaks, which patient states he has been doing.  - He still feels like he is having some retention, although it is improved. Would like to discuss with Dr. Raymondo BandKoval  Tobacco abuse - Has continued to be a battle for him. Goal is to quit but having difficulty cutting down.  - Manages to stay < 1 ppd but seem to oscillate between 0.5 to 0.75 ppd. Currently up at 0.75ppd   ROS: Per HPI  PMH: Smoking history reviewed.   Objective:  Physical Exam: BP 106/62   Pulse 64   Temp 98.1 F (36.7 C) (Oral)   Ht 5\' 11"  (1.803 m)   Wt 154 lb (69.9 kg)   SpO2 97%   BMI 21.48 kg/m   Gen: NAD, resting comfortably CV: RRR with no murmurs appreciated Pulm: NWOB, CTAB with no crackles, wheezes, or rhonchi GI: Normal bowel sounds present.  Soft, Nontender, Nondistended. MSK: no edema, cyanosis, or clubbing noted Skin: warm, dry Neuro: grossly normal, moves all extremities Psych: Normal affect and thought content  Results for orders placed or performed in visit on 07/10/17 (from the past 72 hour(s))  POCT HgB A1C (CPT 83036)     Status: Abnormal   Collection Time: 07/10/17  2:52 PM  Result Value Ref Range   Hemoglobin A1C 10.2      Assessment/Plan:  DM (diabetes mellitus), type 2, uncontrolled (HCC) Uncontrolled but A1c improved to 10.2 from 10.9, recommended following up at pharm clinic with home blood sugar logs. Given his reported am fastings seem to be in an appropriate range, will not titrate up at this time.   BACK PAIN, LUMBAR Reviewed [place] today, no red flags.  - Patient did not need refills of norco today.  - Rx for flexeril 5mg  tablets given so patient would not have to continue splitting his 10mg  tablets.  - Continue nortriptyline. Per chart review low dose doxepin could be an alternative but patient opted to discuss with Dr. Raymondo BandKoval  Tobacco use disorder Counseling on smoking cessation. Patient not ready for any pharmacotherapy at this time.   Leland HerElsia J Yoo, DO PGY-2, [facility] 07/10/2017 2:58 PM

## 2017-07-11 ENCOUNTER — Encounter: Payer: Self-pay | Admitting: Family Medicine

## 2017-07-11 NOTE — Assessment & Plan Note (Signed)
Uncontrolled but A1c improved to 10.2 from 10.9, recommended following up at pharm clinic with home blood sugar logs. Given his reported am fastings seem to be in an appropriate range, will not titrate up at this time.

## 2017-07-11 NOTE — Assessment & Plan Note (Addendum)
Reviewed [place] today, no red flags.  - Patient did not need refills of norco today.  - Rx for flexeril 5mg  tablets given so patient would not have to continue splitting his 10mg  tablets.  - Continue nortriptyline. Per chart review low dose doxepin could be an alternative but patient opted to discuss with Dr. Raymondo BandKoval

## 2017-07-11 NOTE — Assessment & Plan Note (Signed)
Counseling on smoking cessation. Patient not ready for any pharmacotherapy at this time.

## 2017-07-24 ENCOUNTER — Ambulatory Visit (INDEPENDENT_AMBULATORY_CARE_PROVIDER_SITE_OTHER): Payer: Self-pay | Admitting: Pharmacist

## 2017-07-24 ENCOUNTER — Encounter: Payer: Self-pay | Admitting: Pharmacist

## 2017-07-24 DIAGNOSIS — Z794 Long term (current) use of insulin: Secondary | ICD-10-CM

## 2017-07-24 DIAGNOSIS — IMO0002 Reserved for concepts with insufficient information to code with codable children: Secondary | ICD-10-CM

## 2017-07-24 DIAGNOSIS — I10 Essential (primary) hypertension: Secondary | ICD-10-CM

## 2017-07-24 DIAGNOSIS — E1142 Type 2 diabetes mellitus with diabetic polyneuropathy: Secondary | ICD-10-CM

## 2017-07-24 DIAGNOSIS — E1165 Type 2 diabetes mellitus with hyperglycemia: Secondary | ICD-10-CM

## 2017-07-24 MED ORDER — INSULIN DETEMIR 100 UNIT/ML ~~LOC~~ SOLN: 12.0000 [IU] | Freq: Two times a day (BID) | SUBCUTANEOUS | 0 refills | Status: DC

## 2017-07-24 MED ORDER — LISINOPRIL-HYDROCHLOROTHIAZIDE 20-12.5 MG PO TABS: 2.0000 | ORAL_TABLET | Freq: Every day | ORAL | 3 refills | Status: DC

## 2017-07-24 MED ORDER — INSULIN ASPART 100 UNIT/ML ~~LOC~~ SOLN: 6.0000 [IU] | Freq: Three times a day (TID) | SUBCUTANEOUS | 0 refills | Status: DC

## 2017-07-24 NOTE — Patient Instructions (Addendum)
Thanks for coming to see us today!  1. Increase your dose range of Novolog from 5-7 units to 6-8 units injected with meals   2. Increase dose of lisinopril-hctz to 2 tablets taken every morning.  Follow up with your PCP in 2 weeks

## 2017-07-24 NOTE — Assessment & Plan Note (Signed)
Hypertension longstanding currently uncontrolled, systolic pressure was consistently >170 after 2 automated readings and 1 manual reading. Patient denied bothersome symptoms of headache, bleeding issues. He reports occasionally feeling lightheaded.  Patient reports adherence with medication. Control is suboptimal due to suboptimal dosing of medications. - Increase lisinopril/HCTZ to 2 tablets daily (total dose lisinopril 40mg  and HCT 25mg ) when patient will not be climbing ladders. If able to tolerate, continue higher dosage.

## 2017-07-24 NOTE — Progress Notes (Signed)
    S:     Patient arrives in fair spirits, ambulating without assistance.  Presents for diabetes evaluation, education, and management at the request of Dr. [name]. Patient was referred on 07/10/17.  Patient was last seen by Primary Care Provider on 07/10/17.   Patient reports not feeling out of sorts when BG is high. Self reports CBG readings from 100s-200s, with some readings in the 300s Still waiting for VA approval Reports feeling occasionally lightheaded  Patient reports that he has taken his blood pressure medication today.  Patient reports Diabetes was diagnosed in 2013.   Social history: smokes < 1ppd  Patient reports adherence with medications.  Current diabetes medications include: insulin aspart 5-7 units with meals, insulin detemir 12 units BID, metformin 1000 mg BID Current hypertension medications include: lisinopril/HCTZ 10/12.5, metoprolol 25 mg BID  Patient reports hypoglycemic events. Reports fewer events than previously seen, ~1 time a week. No glucose readings less than 70 in recent weeks per written log.  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 Patient sees a "floater" in his eye; reports some bleeding in his eye that was corrected with a laser and is still feeling visual disturbance after the procedure    O:  Physical Exam  Constitutional: He appears well-developed and well-nourished.  Vitals reviewed.    Review of Systems  All other systems reviewed and are negative.    Lab Results  Component Value Date   HGBA1C 10.2 07/10/2017   Vitals:   07/24/17 0916 07/24/17 0922  BP: (!) 178/59 (!) 176/82  Pulse:      Home fasting CBG: ~160-200s 2 hour post-prandial/random CBG: 200s, few 300s   A/P: Diabetes longstanding currently uncontrolled, last A1c 10.2 on 07/10/17 improved but remains greater than goal. Patient denies hypoglycemic events and is able to verbalize appropriate  hypoglycemia management plan. Patient reports adherence with medication. Control is suboptimal due to suboptimal dosing of bolus insulin. - Continue insulin detemir and metformin at current dose - Increase insulin aspart from 5-7 units prior to meals to to 6-8 units prior to meals  Next A1C anticipated October/November   Hypertension longstanding currently uncontrolled, systolic pressure was consistently >170 after 2 automated readings and 1 manual reading. Patient denied bothersome symptoms of headache, bleeding issues. He reports occasionally feeling lightheaded.  Patient reports adherence with medication. Control is suboptimal due to suboptimal dosing of medications. - Increase lisinopril/HCTZ to 2 tablets daily (total dose lisinopril 40mg  and HCT 25mg ) when patient will not be climbing ladders. If able to tolerate, continue higher dosage.   Written patient instructions provided.  Total time in face to face counseling 30 minutes.   Follow up with Dr. [name] in 2 weeks.   Patient seen with Lyanne Cooopali Sharma, PharmD Candidate, Al CorpusLindsey Foltanski, PharmD PGY-1 Resident

## 2017-07-24 NOTE — Assessment & Plan Note (Signed)
Diabetes longstanding currently uncontrolled, last A1c 10.2 on 07/10/17 improved but remains greater than goal. Patient denies hypoglycemic events and is able to verbalize appropriate hypoglycemia management plan. Patient reports adherence with medication. Control is suboptimal due to suboptimal dosing of bolus insulin. - Continue insulin detemir and metformin at current dose - Increase insulin aspart from 5-7 units prior to meals to to 6-8 units prior to meals

## 2017-07-24 NOTE — Progress Notes (Signed)
Patient ID: Colin Keller, male   DOB: 04/03/1956, 60 y.o.   MRN: [id number] Reviewed: Agree with Dr. Koval's documentation and management. 

## 2017-08-07 ENCOUNTER — Encounter: Payer: Self-pay | Admitting: Family Medicine

## 2017-08-21 ENCOUNTER — Telehealth: Payer: Self-pay | Admitting: Family Medicine

## 2017-08-21 DIAGNOSIS — M545 Low back pain, unspecified: Secondary | ICD-10-CM

## 2017-08-21 NOTE — Telephone Encounter (Signed)
Pt is calling and would like for Dr. Raymondo BandKoval to that he has an appointment on 08/28/17 at 4:00 pm with his doctor. jw
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
## 2017-08-22 NOTE — Telephone Encounter (Signed)
This sounds very reasonable and no red flags on history from last visit with me on 07/10/17 or upon review of [place]CCS[place] today. Please let patient know that I can place the printed prescription up front on Thursday morning

## 2017-08-23 NOTE — Telephone Encounter (Signed)
Pt has been informed of rx printed and ready for pick up.

## 2017-08-24 ENCOUNTER — Telehealth: Payer: Self-pay | Admitting: Family Medicine

## 2017-08-24 MED ORDER — HYDROCODONE-ACETAMINOPHEN 10-325 MG PO TABS: 1.0000 | ORAL_TABLET | Freq: Three times a day (TID) | ORAL | 0 refills | Status: DC | PRN

## 2017-08-24 NOTE — Telephone Encounter (Signed)
Pt took Rx to pharmacy today and was told they would not fill it today.  Please call the pharmacy so he can get his medicine today.

## 2017-08-24 NOTE — Telephone Encounter (Signed)
Rx printed and placed up front 

## 2017-08-25 NOTE — Telephone Encounter (Signed)
Called pharmacy. Rx was a few days too early which was the reason they could not fill it. Pharmacy said they would fill it today. Called patient back at [phone] and left message that pharmacy would be refilling prescription.

## 2017-08-28 ENCOUNTER — Encounter: Payer: Self-pay | Admitting: Family Medicine

## 2017-08-28 ENCOUNTER — Ambulatory Visit (INDEPENDENT_AMBULATORY_CARE_PROVIDER_SITE_OTHER): Payer: Self-pay | Admitting: Family Medicine

## 2017-08-28 VITALS — BP 106/64 | HR 63 | Temp 98.2°F | Wt 160.0 lb

## 2017-08-28 DIAGNOSIS — Z1211 Encounter for screening for malignant neoplasm of colon: Secondary | ICD-10-CM

## 2017-08-28 DIAGNOSIS — E1142 Type 2 diabetes mellitus with diabetic polyneuropathy: Secondary | ICD-10-CM

## 2017-08-28 DIAGNOSIS — IMO0002 Reserved for concepts with insufficient information to code with codable children: Secondary | ICD-10-CM

## 2017-08-28 DIAGNOSIS — I10 Essential (primary) hypertension: Secondary | ICD-10-CM

## 2017-08-28 DIAGNOSIS — Z794 Long term (current) use of insulin: Secondary | ICD-10-CM

## 2017-08-28 DIAGNOSIS — E1165 Type 2 diabetes mellitus with hyperglycemia: Secondary | ICD-10-CM

## 2017-08-28 NOTE — Progress Notes (Signed)
    Subjective:  Colin Keller is a 61 y.o. male who presents to the [facility] today for diabetes and HTN follow up  HPI:  Diabetes - Taking metformin  BID, Levemir 12U BID, tolerating well. - Last visit with pharm was told to increase novolog from 5-7U to 6-8 units with meals but has not been able to do so because has had symptomatic lows - Has his home log with him today. Checks am fasting regularly, avg 170s with range 87-373. Nighttime readings have been less frequently but averaging more around mid 200s. - Diabetic Review of Systems - further diabetic ROS: no polyuria or polydipsia, no chest pain, dyspnea or TIA's, no numbness, tingling or pain in extremities, no unusual visual symptoms, last eye exam approximately 2 months ago. - Still waiting to hear back about the [place].  HTN - At pharm clinic he was told to increase to from 1 to 2 tablets of lisinopril-HCTZ but he did not because he was scared to do so, he has stayed at his 20-12.5mg  qd dosing. - Has continued to take his metoprolol  BID  - States that his previous office visits were in the mornings almost directly after taking his morning dose of medications so thought that his home meds may not have needed to be increased - Does not perform home monitoring but has been told at other office visits that occur in the afternoon that his blood pressure has been good. - Has chronic floaters in his eyes but no acute vision changes or HA.    ROS: Per HPI  Objective:  Physical Exam: BP 106/64   Pulse 63   Temp 98.2 F (36.8 C) (Oral)   Wt 160 lb (72.6 kg)   SpO2 99%   BMI 22.32 kg/m  BP 136/60 Gen: NAD, resting comfortably CV: RRR with no murmurs appreciated Pulm: NWOB, CTAB with no crackles, wheezes, or rhonchi GI: Normal bowel sounds present. Soft, Nontender, Nondistended. MSK: no edema, cyanosis, or clubbing noted Skin: warm, dry Neuro: grossly normal, moves all extremities Psych: Normal affect and thought  content   Assessment/Plan:  DM (diabetes mellitus), type 2, uncontrolled (HCC) Uncontrolled but improving in his blood sugar control based on his home readings. Discussed plan with Dr Raymondo Band today. - Increase levemir to 14U in am and keep at 12U at night - No changes to novolog since patient not able to tolerate increase, keep at 5-7U with meals - Continue metformin  BID - Follow up in 6 weeks for recheck a1c and DM follow up  HYPERTENSION, BENIGN At goal today on metoprolol  BID and lisinopril-HCTZ 20-12.5mg  qd. No changes to medications today. Discussed home monitoring if BPs continue to be labile from visit to visit but for now will just monitor during visits.  Health Maintenance - iFOB ordered today for colon cancer screening and patient given instructions for stool collection.  [name], DO PGY-2, [facility] 08/28/2017 4:17 PM

## 2017-08-28 NOTE — Patient Instructions (Addendum)
It was good to see you today!  For your diabetes, - Increase morning levemir to 14U with keeping the same 12 U at night - No changes to your mealtime novolog - Continue metformin as you have been.  For your blood pressure - No changes to your medications, you can continue taking the 1 tablet a day   Continue the good work on cutting down on smoking.  Please check-out at the front desk before leaving the clinic. Make an appointment in  6 weeks for diabetes follow up.  Please bring all of your medications with you to each visit.   Sign up for My Chart to have easy access to your labs results, and communication with your primary care physician.  Feel free to call with any questions or concerns at any time, at [phone].   Take care,  Dr. [name], DO [facility]

## 2017-08-29 MED ORDER — LISINOPRIL-HYDROCHLOROTHIAZIDE 20-12.5 MG PO TABS: 1.0000 | ORAL_TABLET | Freq: Every day | ORAL | 1 refills | Status: DC

## 2017-08-29 NOTE — Assessment & Plan Note (Signed)
At goal today on metoprolol  BID and lisinopril-HCTZ 20-12.5mg  qd. No changes to medications today. Discussed home monitoring if BPs continue to be labile from visit to visit but for now will just monitor during visits.

## 2017-08-29 NOTE — Assessment & Plan Note (Signed)
Uncontrolled but improving in his blood sugar control based on his home readings. Discussed plan with Dr Raymondo Band today. - Increase levemir to 14U in am and keep at 12U at night - No changes to novolog since patient not able to tolerate increase, keep at 5-7U with meals - Continue metformin  BID - Follow up in 6 weeks for recheck a1c and DM follow up

## 2017-09-22 ENCOUNTER — Telehealth: Payer: Self-pay | Admitting: Family Medicine

## 2017-09-22 DIAGNOSIS — M545 Low back pain, unspecified: Secondary | ICD-10-CM

## 2017-09-22 MED ORDER — HYDROCODONE-ACETAMINOPHEN 10-325 MG PO TABS: 1.0000 | ORAL_TABLET | Freq: Three times a day (TID) | ORAL | 0 refills | Status: DC | PRN

## 2017-09-22 NOTE — Telephone Encounter (Signed)
Pt called because he will need a refill on his pain medication the first of next week. Please call when ready to pick up. jw

## 2017-09-22 NOTE — Telephone Encounter (Signed)
[place] PMP reviewed today, no red flags. Rx placed up front.

## 2017-09-22 NOTE — Telephone Encounter (Signed)
LM for patient that script is ready for pick up. Jazmin Hartsell,CMA  

## 2017-10-05 ENCOUNTER — Ambulatory Visit (INDEPENDENT_AMBULATORY_CARE_PROVIDER_SITE_OTHER): Payer: Self-pay | Admitting: Family Medicine

## 2017-10-05 ENCOUNTER — Encounter: Payer: Self-pay | Admitting: Family Medicine

## 2017-10-05 VITALS — BP 162/80 | HR 62 | Temp 98.0°F | Ht 71.0 in | Wt 162.0 lb

## 2017-10-05 DIAGNOSIS — I10 Essential (primary) hypertension: Secondary | ICD-10-CM

## 2017-10-05 DIAGNOSIS — E1165 Type 2 diabetes mellitus with hyperglycemia: Secondary | ICD-10-CM

## 2017-10-05 LAB — POCT GLYCOSYLATED HEMOGLOBIN (HGB A1C): Hemoglobin A1C: 8.5

## 2017-10-05 MED ORDER — LISINOPRIL-HYDROCHLOROTHIAZIDE 20-25 MG PO TABS
1.0000 | ORAL_TABLET | Freq: Every day | ORAL | 0 refills | Status: DC
Start: 2017-10-05 — End: 2018-01-31

## 2017-10-05 MED ORDER — INSULIN GLARGINE 100 UNIT/ML SOLOSTAR PEN: 12.0000 [IU] | PEN_INJECTOR | Freq: Every day | SUBCUTANEOUS | 99 refills | Status: DC

## 2017-10-05 NOTE — Patient Instructions (Addendum)
It was good to see you today!  For your diabetes, Colin Keller- Great job! Keep up the good work with your insulin regimen and how you've been eating. - Switch from levemir to lantus 12U.   For your blood pressure, - Stop taking your current pill which is lisinopril 20mg  with HCTZ 12.5mg  daily - started taking new pill lisinopril 20mg  with HCTZ 25mg  daily   Please check-out at the front desk before leaving the clinic. Make an appointment in 1 month for DM and BP follow up   Please bring all of your medications with you to each visit.   Sign up for My Chart to have easy access to your labs results, and communication with your primary care physician.  Feel free to call with any questions or concerns at any time, at [phone].   Take care,  Dr. Leland HerElsia J Jeraldean Wechter, DO [facility]

## 2017-10-05 NOTE — Assessment & Plan Note (Addendum)
Uncontrolled but much improved given a1c is now 8.5 from 10.2 in July. Change from levemir to lantus since patient cannot afford and only lantus samples available currently. Continue 5-7U novolog with meals. Continue metformin. Follow up in 1 month.

## 2017-10-05 NOTE — Assessment & Plan Note (Signed)
Elevated today. Patient is very reluctant to increase BP meds so agreed to go slowly. Increase from lisinopril HCTZ 20-12.5mg  to 20-25mg  qd. Reviewed symptoms of hypotension with patient. Follow up in 1 month.

## 2017-10-05 NOTE — Progress Notes (Signed)
    Subjective:  Colin KaufmanDavid W Keller is a 61 y.o. male who [facility]  HPI: Diabetes - levemir 12U at night, novolog 5-7U with meals usually right around 5U  - home log: lowest 70 with some symptoms including blurry vision woke him up from sleep that happened once. Otherwise has been more like 90. Accidentally left home log with his clean clothes. - has felt better overall with better blood sugar control - no CP and SOB - only has a few days worth of levemir left, had called and left a message last week inquiring about more levemir samples. Still has not heard about VA coverage  HTN - does not check at home - worse with aggravation and stress  - took BP meds early this morning, than usual 6am - metoprolol 25mg  BID and lisinopril-hctz 20-12.5mg  qd, compliant all the time and tolerating well - no LE edema or HA or vision changes  ROS: Per HPI  Objective:  Physical Exam: BP (!) 160/80 (BP Location: Right Arm, Patient Position: Sitting, Cuff Size: Normal)   Pulse 62   Temp 98 F (36.7 C) (Oral)   Ht 5\' 11"  (1.803 m)   Wt 162 lb (73.5 kg)   SpO2 98%   BMI 22.59 kg/m   Gen: NAD, resting comfortably Skin: warm, dry Neuro: grossly normal, moves all extremities Psych: Normal affect and thought content  Results for orders placed or performed in visit on 10/05/17 (from the past 72 hour(s))  HgB A1c     Status: Abnormal   Collection Time: 10/05/17  3:56 PM  Result Value Ref Range   Hemoglobin A1C 8.5      Assessment/Plan:  HYPERTENSION, BENIGN Elevated today. Patient is very reluctant to increase BP meds so agreed to go slowly. Increase from lisinopril HCTZ 20-12.5mg  to 20-25mg  qd. Reviewed symptoms of hypotension with patient. Follow up in 1 month.  DM (diabetes mellitus), type 2, uncontrolled (HCC) Uncontrolled but much improved given a1c is now 8.5 from 10.2 in July. Change from levemir to lantus since patient cannot afford and only lantus  samples available currently. Continue 5-7U novolog with meals. Continue metformin. Follow up in 1 month.   Colin HerElsia J Clova Morlock, DO PGY-2, [facility] 10/05/2017 4:05 PM

## 2017-10-20 ENCOUNTER — Other Ambulatory Visit: Payer: Self-pay | Admitting: Family Medicine

## 2017-10-20 DIAGNOSIS — M545 Low back pain, unspecified: Secondary | ICD-10-CM

## 2017-10-20 NOTE — Telephone Encounter (Signed)
Asked front office to inform wife that she doesn't need to wait for this script as provider is not in the office today and script won't be filled by another provider.  We will call patient once this is ready next week. Jazmin Hartsell,CMA

## 2017-10-20 NOTE — Telephone Encounter (Signed)
[name] is in the office now. She would like to pick up his Norco Rx since she is here and we are closing early.

## 2017-10-23 MED ORDER — CYCLOBENZAPRINE HCL 5 MG PO TABS: ORAL_TABLET | ORAL | 0 refills | Status: DC

## 2017-10-23 MED ORDER — HYDROCODONE-ACETAMINOPHEN 10-325 MG PO TABS: 1.0000 | ORAL_TABLET | Freq: Three times a day (TID) | ORAL | 0 refills | Status: DC | PRN

## 2017-10-23 NOTE — Telephone Encounter (Signed)
I am currently off site but will print off the prescription this afternoon. I will call the patient when I have the opportunity to do so. If they call back please pass along my apologies about the inconvenience.

## 2017-10-23 NOTE — Telephone Encounter (Signed)
Discussed with patient that will give 3 month printed supply for norco which is placed up front. Also that the plan for future refills is to either call or have wife send mychart message at least 3-4 business days (but preferably 1 week) in advance. PMP review today, no red flags and MME is 30 per day which is at a reasonable level. Patient agreed to plan and was appreciative. Flexeril rx sent electronically to patient pharmacy.

## 2017-10-23 NOTE — Telephone Encounter (Signed)
Pt is calling for a refill on his Hydrocodone and Flexeril. jw

## 2017-10-23 NOTE — Telephone Encounter (Signed)
Pt called about his medications being refilled again. Says he needs them now because he is completely out. When you call the pt back please call the cell phone number: [phone].

## 2017-10-24 ENCOUNTER — Telehealth: Payer: Self-pay

## 2017-10-24 NOTE — Telephone Encounter (Signed)
Patient has called to say that he is out of his sample insulin and that he needs more from Dr. Raymondo BandKoval but he wanted Dr. [name] to be aware as well. He is completely out.Glennie HawkSimpson, Michelle R

## 2017-10-24 NOTE — Telephone Encounter (Signed)
I am off site today. Will defer to Dr. Raymondo BandKoval and team for providing patient with samples. He was previously on levemir and ran out. Our clinic only had lantus and he was given 1 lantus pen at his last visit. He is still waiting to hear about about VA coverage. He will need sample of long acting insulin if we can please provide.

## 2017-10-25 NOTE — Telephone Encounter (Signed)
Do you want patient to have another sample of lantus? Jazmin Hartsell,CMA

## 2017-10-25 NOTE — Telephone Encounter (Signed)
Yes 2 pens of lantus please.

## 2017-10-25 NOTE — Telephone Encounter (Signed)
Called to inform patient I had both Levemir and Novolog for his pick up.   He will come and pick up the sample medications within the hour.   [place]    Medication Samples have been provided to the patient.  Drug name: Levemir Pen Qty: 1  LOT: [id number]  Exp.Date: 07/11/2018 Drug name: Mack Guiseovolog Vial Qty: 1  LOT: [id number]  Exp.Date: 01/11/2019  Provided pen needles and syringes in bag for pick up at front desk.   The patient has been instructed regarding the correct time, dose, and frequency of taking this medication, including desired effects and most common side effects.   [name] 5:26 PM 10/25/2017

## 2017-10-25 NOTE — Telephone Encounter (Signed)
Pt is completely out of his insulin. He doesn't have the money to buy any.  Please advise

## 2017-11-06 ENCOUNTER — Telehealth: Payer: Self-pay | Admitting: *Deleted

## 2017-11-06 NOTE — Telephone Encounter (Signed)
I am off site today. Please give the patient 1 pen of levemir sample unless Dr. Raymondo BandKoval has any other recommendations.

## 2017-11-06 NOTE — Telephone Encounter (Signed)
Dr. Raymondo BandKoval spoke with patient and samples are in the refrigerator for him when he shows up. Damir Leung,CMA

## 2017-11-06 NOTE — Telephone Encounter (Signed)
Patient called and LM on nurse line requesting samples of his levemir insulin.  States that he will be out in a couple of days.  Will forward to MD and Dr. Raymondo BandKoval to address. Jazmin Hartsell,CMA

## 2017-11-17 ENCOUNTER — Other Ambulatory Visit: Payer: Self-pay | Admitting: Family Medicine

## 2017-11-17 ENCOUNTER — Other Ambulatory Visit: Payer: Self-pay

## 2017-11-17 ENCOUNTER — Ambulatory Visit (INDEPENDENT_AMBULATORY_CARE_PROVIDER_SITE_OTHER): Payer: Self-pay | Admitting: Family Medicine

## 2017-11-17 ENCOUNTER — Encounter: Payer: Self-pay | Admitting: Family Medicine

## 2017-11-17 VITALS — BP 130/58 | HR 65 | Temp 98.2°F | Ht 71.0 in | Wt 157.2 lb

## 2017-11-17 DIAGNOSIS — M545 Low back pain, unspecified: Secondary | ICD-10-CM

## 2017-11-17 DIAGNOSIS — R05 Cough: Secondary | ICD-10-CM

## 2017-11-17 DIAGNOSIS — R058 Other specified cough: Secondary | ICD-10-CM

## 2017-11-17 DIAGNOSIS — E1165 Type 2 diabetes mellitus with hyperglycemia: Secondary | ICD-10-CM

## 2017-11-17 MED ORDER — INSULIN DETEMIR 100 UNIT/ML ~~LOC~~ SOLN: 12.0000 [IU] | Freq: Every day | SUBCUTANEOUS | 0 refills | Status: DC

## 2017-11-17 NOTE — Progress Notes (Signed)
    Subjective:  Colin Keller is a 61 y.o. male who [facility] the [facility] today with a chief complaint of cough.   HPI:  Cough - Cough for several weeks, started off with congestion and runny nose and productive cough. - Congestion and runny nose lasted for about 1 week but cough persisted all last week so he called for an appointment - He woke up today feeling significantly improved and thinks he's getting better on his own - no fever/chills, no n/v/d, no sore throat - has been taking nyquil at night only, not taking any other OTC medications currently  Diabetes - still taking 12U levemir at night, has about half a bottle left at home - has plenty of novolog, doing 5-7U with meals - no hypoglycemic events - no CP or SOB   ROS: Per HPI  Objective:  Physical Exam: BP (!) 130/58   Pulse 65   Temp 98.2 F (36.8 C) (Oral)   Ht 5\' 11"  (1.803 m)   Wt 157 lb 3.2 oz (71.3 kg)   SpO2 99%   BMI 21.92 kg/m   Gen: NAD, resting comfortably HEENT: [place], AT. TMs peraly with good light reflex bilaterally. Oropharynx nonerythematous and no drainage. Neck: supple, no lymphadenopathy CV: RRR with no murmurs appreciated Pulm: NWOB, CTAB with no crackles, wheezes, or rhonchi GI: Normal bowel sounds present. Soft, Nontender, Nondistended. MSK: no edema, cyanosis, or clubbing noted Skin: warm, dry Neuro: grossly normal, moves all extremities Psych: Normal affect and thought content   Assessment/Plan:  Post-viral cough syndrome Appears to be over the acute viral illness now and has a mild persistent cough that is likely a post URI cough. Discussed that cough can linger for several weeks following a viral URI. Also recommended monitoring BP with OTC cough medications but BP is good today. Recommended symptomatic management at home with warm liquids.  DM (diabetes mellitus), type 2, uncontrolled (HCC) Doing well today and not yet due for a1c recheck. Levemir sample given today.    Colin HerElsia J  Dwyane Dupree, DO PGY-2, [facility] 11/17/2017 11:39 AM

## 2017-11-17 NOTE — Telephone Encounter (Signed)
lori called asking for pts norco to be refilled early.  They are going out of town this weekend.  She would like to get it called [facility].

## 2017-11-18 DIAGNOSIS — R05 Cough: Secondary | ICD-10-CM | POA: Insufficient documentation

## 2017-11-18 DIAGNOSIS — R058 Other specified cough: Secondary | ICD-10-CM | POA: Insufficient documentation

## 2017-11-18 NOTE — Assessment & Plan Note (Signed)
Doing well today and not yet due for a1c recheck. Levemir sample given today.

## 2017-11-18 NOTE — Assessment & Plan Note (Signed)
Appears to be over the acute viral illness now and has a mild persistent cough that is likely a post URI cough. Discussed that cough can linger for several weeks following a viral URI. Also recommended monitoring BP with OTC cough medications but BP is good today. Recommended symptomatic management at home with warm liquids.

## 2017-11-21 MED ORDER — HYDROCODONE-ACETAMINOPHEN 10-325 MG PO TABS: 1.0000 | ORAL_TABLET | Freq: Three times a day (TID) | ORAL | 0 refills | Status: DC | PRN

## 2017-11-21 NOTE — Telephone Encounter (Signed)
Spoke with wife and let her know that we printed the scripts for him to pick up.  She states that patient already has scripts at the pharmacy and doesn't need new ones.  He just needs us to call them and give the verbal ok to fill the norco a day early.  Scripts printed today will be shredded. Lezlee Gills,CMA

## 2017-11-21 NOTE — Telephone Encounter (Signed)
Wife is calling to see if her husband can get his pain medicine early? It is due to be picked up tomorrow. jw

## 2017-11-21 NOTE — Telephone Encounter (Signed)
[place] database reviewed today. No red flags. Rx printed prescription for 3 months placed up front for pick up.

## 2018-01-01 ENCOUNTER — Other Ambulatory Visit: Payer: Self-pay | Admitting: Family Medicine

## 2018-01-01 DIAGNOSIS — M545 Low back pain, unspecified: Secondary | ICD-10-CM

## 2018-01-22 ENCOUNTER — Telehealth: Payer: Self-pay | Admitting: *Deleted

## 2018-01-22 DIAGNOSIS — E1165 Type 2 diabetes mellitus with hyperglycemia: Secondary | ICD-10-CM

## 2018-01-22 MED ORDER — INSULIN DETEMIR 100 UNIT/ML ~~LOC~~ SOLN: 12.0000 [IU] | Freq: Every day | SUBCUTANEOUS | 0 refills | Status: DC

## 2018-01-22 NOTE — Telephone Encounter (Signed)
Patient left message on nurse line that he is out of his Levemir insulin. Has plenty of Novolog. States he gets this from Drs Koval/Yoo. Spoke with Dr Raymondo BandKoval and sample vial available for patient. Signed out per protocol and placed in bag in refrigerator for patient. Left message on identified voicemail that vial is ready to be picked up. Ples SpecterAlisa Brake, RN [facility] RN)

## 2018-01-22 NOTE — Addendum Note (Signed)
Addended by: Shirlean KellyFARRAR, CLARISSA J on: 01/22/2018 10:22 AM   Modules accepted: Orders

## 2018-01-24 LAB — FECAL OCCULT BLOOD, IMMUNOCHEMICAL: FECAL OCCULT BLD: NEGATIVE

## 2018-01-29 ENCOUNTER — Telehealth: Payer: Self-pay | Admitting: Family Medicine

## 2018-01-29 DIAGNOSIS — M545 Low back pain, unspecified: Secondary | ICD-10-CM

## 2018-01-29 NOTE — Telephone Encounter (Signed)
Pt called and needs a refill on his pain medication left up front for pick up. Please let him know so that he can pick it up.

## 2018-01-29 NOTE — Telephone Encounter (Signed)
Needs appointment as has been 6 months since chronic pain last discussed

## 2018-01-29 NOTE — Telephone Encounter (Signed)
Patient has an appt on 01/31/18 with Dr. [name]. Colin Keller [facility],CMA

## 2018-01-30 MED ORDER — HYDROCODONE-ACETAMINOPHEN 10-325 MG PO TABS: 1.0000 | ORAL_TABLET | Freq: Three times a day (TID) | ORAL | 0 refills | Status: DC | PRN

## 2018-01-30 NOTE — Telephone Encounter (Signed)
[place] pmp reviewed. No red flags. Rx sent to patient pharmacy.

## 2018-01-31 ENCOUNTER — Encounter: Payer: Self-pay | Admitting: Family Medicine

## 2018-01-31 ENCOUNTER — Ambulatory Visit: Payer: Self-pay | Admitting: Family Medicine

## 2018-01-31 ENCOUNTER — Other Ambulatory Visit: Payer: Self-pay

## 2018-01-31 VITALS — BP 114/80 | HR 60 | Temp 98.3°F | Ht 71.0 in | Wt 163.0 lb

## 2018-01-31 DIAGNOSIS — I10 Essential (primary) hypertension: Secondary | ICD-10-CM

## 2018-01-31 DIAGNOSIS — E1165 Type 2 diabetes mellitus with hyperglycemia: Secondary | ICD-10-CM

## 2018-01-31 DIAGNOSIS — G8929 Other chronic pain: Secondary | ICD-10-CM

## 2018-01-31 MED ORDER — LISINOPRIL-HYDROCHLOROTHIAZIDE 20-25 MG PO TABS: 1.0000 | ORAL_TABLET | Freq: Every day | ORAL | 0 refills | Status: DC

## 2018-01-31 MED ORDER — METFORMIN HCL 1000 MG PO TABS: 1000.0000 mg | ORAL_TABLET | Freq: Two times a day (BID) | ORAL | 3 refills | Status: DC

## 2018-01-31 NOTE — Patient Instructions (Addendum)
It was good to see you today!  We will check bloodwork today.  Let me know when you needs refills on your medications.  Please check-out at the front desk before leaving the clinic. Make an appointment in  3 months for regular follow up.    Please bring all of your medications with you to each visit.   Sign up for My Chart to have easy access to your labs results, and communication with your primary care physician.  Feel free to call with any questions or concerns at any time, at [phone].   Take care,  Dr. Leland HerElsia J Izumi Mixon, DO [facility]

## 2018-01-31 NOTE — Progress Notes (Signed)
    Subjective:  Colin Keller is a 62 y.o. male who [facility] for follow up on DM and chronic pain   HPI:  Diabetes - 12U levemir, sometimes on 11 or 10 U if am sugars,  - novolog 5-7 U with meals - metformin 1000mg  BID - have not been checking like should. Range 84-210, avg in 150s - no symptomatic lows. - no CP, SOB, polyuria, polydipsia  HTN - metoprolol 25mg  BID and lisinopril-hctz 20-25mg  qd - compliant on his home meds and tolerating well - no lightheadness or dizziness. 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 In fact has not needed as much recently as had less work this winter than is typical for him. - no lethargy, somnolence,     ROS: Per HPI  PMH: He reports that he has been smoking cigarettes.  He started smoking about 47 years ago. He has a 22.50 pack-year smoking history. he has never used smokeless tobacco. He reports that he does not drink alcohol or use drugs.  Now down to 15 cig per day on a good day. Plans on cutting down more. Determined to quit someday.   Objective:  Physical Exam: BP 114/80   Pulse 60   Temp 98.3 F (36.8 C) (Oral)   Ht 5\' 11"  (1.803 m)   Wt 163 lb (73.9 kg)   SpO2 99%   BMI 22.73 kg/m   Gen: NAD, resting comfortably CV: RRR with no murmurs appreciated Pulm: NWOB, CTAB with no crackles, wheezes, or rhonchi GI: Normal bowel sounds present. Soft, Nontender, Nondistended. MSK: no edema, cyanosis, or clubbing noted Skin: warm, dry Neuro: grossly normal, moves all extremities Psych: Normal affect and thought content   Assessment/Plan:  DM (diabetes mellitus), type 2, uncontrolled (HCC) Due for a1c recheck, suspect that he is not doing as well recently but cannot be sure given his lack of glucose monitoring at home. Will recheck a1c and if improved then  follow up in 3 months. If still uncontrolled then will as patient to come back in 1 month with home log.  HYPERTENSION, BENIGN Stable, controlled. Doing well on current medication regimen of metoprolol 25mg  BID and lisinopril-hctz 20-25mg  qd. No changes.  Encounter for chronic pain management Stable, controlled. [place] PMP reviewed. No red flags. Patient is on norco 10-325mg  q8h prn #90 per month. Refill given electronically recently and will continue 3 month reassessments   Colin HerElsia J Semaja Lymon, DO PGY-2, [facility] 01/31/2018 4:00 PM

## 2018-02-01 LAB — BASIC METABOLIC PANEL
BUN / CREAT RATIO: 22 (ref 10–24)
BUN: 43 mg/dL — ABNORMAL HIGH (ref 8–27)
CO2: 22 mmol/L (ref 20–29)
Calcium: 10.2 mg/dL (ref 8.6–10.2)
Chloride: 107 mmol/L — ABNORMAL HIGH (ref 96–106)
Creatinine, Ser: 1.96 mg/dL — ABNORMAL HIGH (ref 0.76–1.27)
GFR calc Af Amer: 41 mL/min/{1.73_m2} — ABNORMAL LOW (ref >59)
GFR calc non Af Amer: 35 mL/min/{1.73_m2} — ABNORMAL LOW (ref >59)
GLUCOSE: 72 mg/dL (ref 65–99)
Potassium: 5.4 mmol/L — ABNORMAL HIGH (ref 3.5–5.2)
SODIUM: 144 mmol/L (ref 134–144)

## 2018-02-01 LAB — CBC
Hematocrit: 33.5 % — ABNORMAL LOW (ref 37.5–51.0)
Hemoglobin: 11.5 g/dL — ABNORMAL LOW (ref 13.0–17.7)
MCH: 31.3 pg (ref 26.6–33.0)
MCHC: 34.3 g/dL (ref 31.5–35.7)
MCV: 91 fL (ref 79–97)
Platelets: 298 10*3/uL (ref 150–379)
RBC: 3.68 x10E6/uL — ABNORMAL LOW (ref 4.14–5.80)
RDW: 15 % (ref 12.3–15.4)
WBC: 10.3 10*3/uL (ref 3.4–10.8)

## 2018-02-01 LAB — HEMOGLOBIN A1C
ESTIMATED AVERAGE GLUCOSE: 260 mg/dL
HEMOGLOBIN A1C: 10.7 % — AB (ref 4.8–5.6)

## 2018-02-01 NOTE — Assessment & Plan Note (Signed)
Due for a1c recheck, suspect that he is not doing as well recently but cannot be sure given his lack of glucose monitoring at home. Will recheck a1c and if improved then follow up in 3 months. If still uncontrolled then will as patient to come back in 1 month with home log.

## 2018-02-01 NOTE — Assessment & Plan Note (Addendum)
Stable, controlled. [place] PMP reviewed. No red flags. Patient is on norco 10-325mg  q8h prn #90 per month. Refill given electronically recently and will continue 3 month reassessments

## 2018-02-01 NOTE — Assessment & Plan Note (Signed)
Stable, controlled. Doing well on current medication regimen of metoprolol 25mg  BID and lisinopril-hctz 20-25mg  qd. No changes.

## 2018-02-02 ENCOUNTER — Encounter: Payer: Self-pay | Admitting: Family Medicine

## 2018-02-02 ENCOUNTER — Telehealth: Payer: Self-pay

## 2018-02-02 NOTE — Telephone Encounter (Signed)
Patient left message that he is returning call from Dr. [name]. Call back is [phone]. Ples SpecterAlisa Brake, RN [facility] RN)

## 2018-02-05 NOTE — Telephone Encounter (Signed)
Called patient back but no answer. Left message saying I had sent him a mychart message with the details but that hoping to see him back in 1 month with home glucose log.

## 2018-02-08 ENCOUNTER — Telehealth: Payer: Self-pay | Admitting: Family Medicine

## 2018-02-08 DIAGNOSIS — E1165 Type 2 diabetes mellitus with hyperglycemia: Secondary | ICD-10-CM

## 2018-02-08 MED ORDER — INSULIN DETEMIR 100 UNIT/ML ~~LOC~~ SOLN: 12.0000 [IU] | Freq: Every day | SUBCUTANEOUS | 0 refills | Status: DC

## 2018-02-08 NOTE — Telephone Encounter (Signed)
Pt called about his insulin needles. He said he has been getting his needles from the [facility] in [place], but now they will not give them to him. He said the pharmacy will not give them to him because he hasn't gotten his insulin refilled there since 2014. Raymondo BandKoval has been supplying him with insulin samples while he is waiting for his VA benefits to kick in. He doesn't know how else to get his needles. Please advise

## 2018-02-08 NOTE — Telephone Encounter (Signed)
Called pharmacy. They needed a Rx for levemir to place on file as proof that patient was still on insulin in order to provide needles. They do not need to dispense the levemir. After they receive this Rx they will provide him with needles so [facility] Please inform patient that he will be able to receive needles.

## 2018-02-08 NOTE — Telephone Encounter (Signed)
Will forward to Dr. Raymondo BandKoval and PCP to decide.  Jazmin Hartsell,CMA

## 2018-02-09 NOTE — Telephone Encounter (Signed)
Patient informed and voiced understanding.  Kamoni Depree,CMA  

## 2018-02-26 ENCOUNTER — Telehealth: Payer: Self-pay | Admitting: Family Medicine

## 2018-02-26 DIAGNOSIS — M545 Low back pain, unspecified: Secondary | ICD-10-CM

## 2018-02-26 MED ORDER — HYDROCODONE-ACETAMINOPHEN 10-325 MG PO TABS: 1.0000 | ORAL_TABLET | Freq: Three times a day (TID) | ORAL | 0 refills | Status: DC | PRN

## 2018-02-26 NOTE — Telephone Encounter (Signed)
Needs refill on norco.  [facility].  Please fax a Rx today.  Please call pt when it has been faxed over.

## 2018-02-26 NOTE — Telephone Encounter (Signed)
[place] database reviewed today, no red flags. Since patient was last seen with me on 01/31/18, will refill today and given additional month.

## 2018-03-12 ENCOUNTER — Encounter: Payer: Self-pay | Admitting: Family Medicine

## 2018-03-12 ENCOUNTER — Other Ambulatory Visit: Payer: Self-pay

## 2018-03-12 ENCOUNTER — Ambulatory Visit: Payer: Self-pay | Admitting: Family Medicine

## 2018-03-12 VITALS — BP 146/62 | HR 59 | Temp 98.0°F | Wt 162.0 lb

## 2018-03-12 DIAGNOSIS — Z794 Long term (current) use of insulin: Secondary | ICD-10-CM

## 2018-03-12 DIAGNOSIS — IMO0002 Reserved for concepts with insufficient information to code with codable children: Secondary | ICD-10-CM

## 2018-03-12 DIAGNOSIS — E1165 Type 2 diabetes mellitus with hyperglycemia: Secondary | ICD-10-CM

## 2018-03-12 DIAGNOSIS — E1142 Type 2 diabetes mellitus with diabetic polyneuropathy: Secondary | ICD-10-CM

## 2018-03-12 MED ORDER — INSULIN ASPART 100 UNIT/ML ~~LOC~~ SOLN: 5.0000 [IU] | Freq: Two times a day (BID) | SUBCUTANEOUS | 11 refills | Status: DC

## 2018-03-12 MED ORDER — EMPAGLIFLOZIN 10 MG PO TABS: 10.0000 mg | ORAL_TABLET | Freq: Every day | ORAL | 11 refills | Status: DC

## 2018-03-12 MED ORDER — INSULIN DEGLUDEC 100 UNIT/ML ~~LOC~~ SOPN: 12.0000 [IU] | PEN_INJECTOR | Freq: Every day | SUBCUTANEOUS | 11 refills | Status: DC

## 2018-03-12 MED ORDER — INSULIN DEGLUDEC 100 UNIT/ML ~~LOC~~ SOPN: 12.0000 [IU] | PEN_INJECTOR | Freq: Every day | SUBCUTANEOUS | 0 refills | Status: DC

## 2018-03-12 NOTE — Progress Notes (Signed)
    Subjective:  Colin KaufmanDavid W Keller is a 62 y.o. male who [facility] the [facility] for diabetes follow up  HPI:  Still taking metformin 1000mg  BID, levemir 12U at night, novolog 5-7U BID with breakfast and dinner.  Lately has not felt hungry so does not eat lunch. He will take 7U in the morning if his morning fastings are > mid 200s. He is more cautious regarding evening sugars so will usually do 5U as he wants to avoid hypoglycemic events at night. [place] and [place] regarding [place]. Brought home log, checks morning and evening. Morning fastings ranging from 64-300s, He feels bad with the several readings in the 80s and 60s.  Review of Systems - further diabetic ROS: no polyuria or polydipsia, no chest pain, dyspnea or TIA's, no unusual visual symptoms, no medication side effects noted.     ROS: Per HPI  Objective:  Physical Exam: BP (!) 146/62   Pulse (!) 59   Temp 98 F (36.7 C) (Oral)   Wt 162 lb (73.5 kg)   SpO2 99%   BMI 22.59 kg/m   Gen: NAD, resting comfortably CV: RRR with no murmurs appreciated  Pulm: NWOB, CTAB with no crackles, wheezes, or rhonchi GI: Normal bowel sounds present. Soft, Nontender, Nondistended. MSK: no edema, cyanosis, or clubbing noted Skin: warm, dry Neuro: grossly normal, moves all extremities Psych: Normal affect and thought content   Assessment/Plan:  DM (diabetes mellitus), type 2, uncontrolled (HCC) Uncontrolled. Have been limited in medications as patient [place] over the last [place], however given his worsening kidney function (Cr 01/31/18 1.96, increased from 1.37 07/15/16) will not further delay awaiting on insurance. Discussed with Dr. Raymondo BandKoval today.  - Change from levemir to tresiba 12U  - continue novolog 5-7U with breakfast and dinner as patient does not eat lunch - start jardiance 10 mg qd Follow up in pharm clinic in 2 weeks.   Colin HerElsia J Caia Lofaro, DO PGY-2, [facility] 03/12/2018 2:05 PM

## 2018-03-12 NOTE — Patient Instructions (Signed)
Change from levemir to tresiba 12 units at night. No changes to novolog for now, keep doing your 5-7 units with breakfast and dinner. Start jardiance 10mg  daily. - These diabetes medications were [facility], there is a medication assistance program. You should go fill out paperwork.  Apply for the orange card, make an appointment with Annice PihJackie for this process.  Come back in 2 weeks to see Dr. Raymondo BandKoval.

## 2018-03-13 NOTE — Assessment & Plan Note (Signed)
Uncontrolled. Have been limited in medications as patient was [place] over the last year, however given his worsening kidney function (Cr 01/31/18 1.96, increased from 1.37 07/15/16) will not further delay awaiting on insurance. Discussed with Dr. Raymondo BandKoval today.  - Change from levemir to tresiba 12U  - continue novolog 5-7U with breakfast and dinner as patient does not eat lunch - start jardiance 10 mg qd Follow up in pharm clinic in 2 weeks.

## 2018-03-23 ENCOUNTER — Other Ambulatory Visit: Payer: Self-pay

## 2018-03-23 DIAGNOSIS — I10 Essential (primary) hypertension: Secondary | ICD-10-CM

## 2018-03-23 MED ORDER — METOPROLOL TARTRATE 50 MG PO TABS: 25.0000 mg | ORAL_TABLET | Freq: Two times a day (BID) | ORAL | 3 refills | Status: DC

## 2018-03-23 NOTE — Telephone Encounter (Signed)
Was there an issue with the Rx that was sent in for this earlier today?

## 2018-03-23 NOTE — Telephone Encounter (Signed)
Patient wife called. Needs metoprolol refill. Has been sent from pharmacy as fax couple days ago. Patient out as of today. Ples SpecterAlisa Roseland Braun, RN [facility] RN)

## 2018-03-27 ENCOUNTER — Other Ambulatory Visit: Payer: Self-pay | Admitting: Family Medicine

## 2018-03-27 ENCOUNTER — Ambulatory Visit: Payer: Self-pay | Admitting: Family Medicine

## 2018-03-27 DIAGNOSIS — M545 Low back pain, unspecified: Secondary | ICD-10-CM

## 2018-03-27 NOTE — Telephone Encounter (Signed)
Pt called because he needs a refill on his hydrocodone sent in. Please let patient know when this is done. jw

## 2018-03-28 MED ORDER — HYDROCODONE-ACETAMINOPHEN 10-325 MG PO TABS: 1.0000 | ORAL_TABLET | Freq: Three times a day (TID) | ORAL | 0 refills | Status: DC | PRN

## 2018-03-28 NOTE — Telephone Encounter (Signed)
[place] PMP reviewed. No red flags. Patient has had chronic pain appointment with me on February so will be due next in May. 2 month refill given today.

## 2018-03-29 ENCOUNTER — Ambulatory Visit: Payer: Self-pay | Admitting: Pharmacist

## 2018-03-29 ENCOUNTER — Encounter: Payer: Self-pay | Admitting: Pharmacist

## 2018-03-29 DIAGNOSIS — E1142 Type 2 diabetes mellitus with diabetic polyneuropathy: Secondary | ICD-10-CM

## 2018-03-29 DIAGNOSIS — I10 Essential (primary) hypertension: Secondary | ICD-10-CM

## 2018-03-29 DIAGNOSIS — F17200 Nicotine dependence, unspecified, uncomplicated: Secondary | ICD-10-CM

## 2018-03-29 DIAGNOSIS — E785 Hyperlipidemia, unspecified: Secondary | ICD-10-CM

## 2018-03-29 DIAGNOSIS — Z794 Long term (current) use of insulin: Secondary | ICD-10-CM

## 2018-03-29 DIAGNOSIS — IMO0002 Reserved for concepts with insufficient information to code with codable children: Secondary | ICD-10-CM

## 2018-03-29 DIAGNOSIS — E1165 Type 2 diabetes mellitus with hyperglycemia: Secondary | ICD-10-CM

## 2018-03-29 MED ORDER — INSULIN DEGLUDEC 100 UNIT/ML ~~LOC~~ SOPN: 20.0000 [IU] | PEN_INJECTOR | SUBCUTANEOUS | 11 refills | Status: DC

## 2018-03-29 MED ORDER — INSULIN ASPART 100 UNIT/ML ~~LOC~~ SOLN: 5.0000 [IU] | Freq: Two times a day (BID) | SUBCUTANEOUS | 0 refills | Status: DC

## 2018-03-29 MED ORDER — INSULIN ASPART 100 UNIT/ML ~~LOC~~ SOLN: 5.0000 [IU] | Freq: Two times a day (BID) | SUBCUTANEOUS | 11 refills | Status: DC

## 2018-03-29 MED ORDER — PRAVASTATIN SODIUM 20 MG PO TABS: 20.0000 mg | ORAL_TABLET | ORAL | 2 refills | Status: DC

## 2018-03-29 MED ORDER — CARVEDILOL 3.125 MG PO TABS: 3.1250 mg | ORAL_TABLET | Freq: Two times a day (BID) | ORAL | 3 refills | Status: DC

## 2018-03-29 MED ORDER — INSULIN DEGLUDEC 100 UNIT/ML ~~LOC~~ SOPN: 20.0000 [IU] | PEN_INJECTOR | SUBCUTANEOUS | 0 refills | Status: DC

## 2018-03-29 NOTE — Assessment & Plan Note (Signed)
Hypertension longstanding currently uncontrolled.  Patient reports adherence with medication. Control is suboptimal due to high sodium diet, stress, and non-optimal medication therapy. Reports episodes of dizziness that is 2/2 to possible dehydration during work. Counseled on proper hydration and water intake. HR in clinic was 61 and averages between 60 - 70 in past readings. Discontinued metoprolol tartrate 25 mg twice daily. Started carvedilol 3.125 mg twice daily with goal to titrate therapy at follow up. Increased lisinopril-HCTZ from 10-12.5 mg once daily to 20-25 mg once daily.

## 2018-03-29 NOTE — Patient Instructions (Addendum)
Thank you for coming into the clinic today!  Stop metoprolol tartrate 25 mg tablets Start carvedilol 3.125 mg tablets once daily Increase Lisinopril-HCTZ from half a pill to a full pill (20-25 mg) once daily  Change Tresiba to 20 units once a day in the morning  Call the clinic if you have trouble applying for the [facility] at the [facility].  Follow up with Dr. Raymondo BandKoval in a month.

## 2018-03-29 NOTE — Assessment & Plan Note (Signed)
Moderate Nicotine Dependence of 22.5 years duration in a patient who is good candidate for success b/c of high self-motivation and confidence. Will set a quit date after patient feels his finances are in order and after attending a funeral for his wife's aunt who recently passed.

## 2018-03-29 NOTE — Progress Notes (Signed)
S:    Chief Complaint  Patient presents with  . Medication Management    T2DM, smoking cessation, HTN   Patient arrives in good spirits and ambulating without assitance.  Presents for diabetes evaluation, education, and management at the request of Dr. [name]. Patient was referred on 03/12/18.  Patient was last seen by Primary Care Provider on 03/12/18.   Family/Social History: currently employed which has relieved past stressors in life (in Jan/Feb patient was unemployed and only working various off jobs with unstable income). 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 Currently smoking a pack per day. One quit attempt (2 days) a month ago but resumed after finding a pack of cigarettes in his truck. Motivated to quit smoking again soon due to both health and monetary incentives of quitting. 10/10 confidence in quitting. Denies withdrawal sx during his 3 prior quit attempts, however patient believes triggers including a son who smokes at home.  Patient reports adherence with medications.  Current diabetes medications include: Tresiba (insulin degludec) 12 units twice daily, Novolog (insulin aspart) 5 - 7 units with a meal twice a day, metformin 1000 mg twice daily. Not currently taking Jardiance.  Current hypertension medications include: lisinopril-hydrochlorothiazide 10-12.5 mg once daily, metoprolol 25 mg twice daily.   Patient reports hypoglycemic events. Most recent low event (see Objective for BG values) was after a long day of heavy exercise at work.  Patient reported dietary habits: Eats 3 meals/day Breakfast: eggs & sausage and biscuit (larger meal) Lunch: can of vienna sausage and crackers (smaller meal) Dinner: chicken, rice, and tomatoes Snacks: crackers  Drinks: milk  O:  Physical Exam  Constitutional: He appears well-developed.   Review of Systems  Neurological: Positive for dizziness.   Lab Results  Component Value Date   HGBA1C  10.7 (H) 01/31/2018   Vitals:   03/29/18 1535  BP: (!) 154/72  Pulse: 61  SpO2: 98%   Home fasting CBG: 102-289 mg/dL 2 hour post-prandial/random CBG: 181-253 mg/dL Low BG: 16-1059-84 mg/dL (5x instances in last two months)  10 year ASCVD risk: >25% (36.9%).  A/P: Educated patient on how to apply for the [employer] Card and use medication assistance program at the [facility].  Moderate Nicotine Dependence of 22.5 years duration in a patient who is good candidate for success b/c of high self-motivation and confidence. Will set a quit date after patient feels his finances are in order and after attending a funeral for his wife's aunt who recently passed.     Diabetes longstanding currently uncontrolled. Patient reports at least five (5) hypoglycemic events and is able to verbalize appropriate hypoglycemia management plan. Patient reports adherence with medication. Control is suboptimal due to multiple life stressors and uninsured status causing limiting access to medications. Next A1C anticipated May 2019.   ASCVD risk greater than 7.5%. Continued Aspirin 81 mg and Continued pravastatin 20 mg every other day. Failed past therapies due to severe myalgias. Not ready to re-challenge patient with higher statin doses or dosing frequencies at this time.    Hypertension longstanding currently uncontrolled.  Patient reports adherence with medication. Control is suboptimal due to high sodium diet, stress, and non-optimal medication therapy. Reports episodes of dizziness that is 2/2 to possible dehydration during work. Counseled on proper hydration and water intake. HR in clinic was 61 and averages between 60 - 70 in past readings. Discontinued metoprolol tartrate 25 mg twice daily. Started carvedilol 3.125 mg twice daily with goal to  titrate therapy at follow up. Increased lisinopril-HCTZ from 10-12.5 mg once daily to 20-25 mg once daily.   Written patient instructions provided.  Total time in face to face  counseling 60 minutes.   Follow up in Pharmacist Clinic Visit in one month.   Patient seen with Rodolph Bong, PharmD Candidate.

## 2018-03-29 NOTE — Assessment & Plan Note (Signed)
Diabetes longstanding currently uncontrolled. Patient reports at least five (5) hypoglycemic events and is able to verbalize appropriate hypoglycemia management plan. Patient reports adherence with medication. Control is suboptimal due to multiple life stressors and uninsured status causing limiting access to medications. Next A1C anticipated May 2019.

## 2018-03-30 NOTE — Progress Notes (Signed)
Patient ID: Colin Keller, male   DOB: 07/18/1956, 61 y.o.   MRN: [id number] Reviewed: Agree with Dr. Koval's documentation and management. 

## 2018-04-30 ENCOUNTER — Encounter: Payer: Self-pay | Admitting: Pharmacist

## 2018-04-30 ENCOUNTER — Ambulatory Visit (INDEPENDENT_AMBULATORY_CARE_PROVIDER_SITE_OTHER): Payer: Self-pay | Admitting: Pharmacist

## 2018-04-30 DIAGNOSIS — Z794 Long term (current) use of insulin: Secondary | ICD-10-CM

## 2018-04-30 DIAGNOSIS — E1142 Type 2 diabetes mellitus with diabetic polyneuropathy: Secondary | ICD-10-CM

## 2018-04-30 DIAGNOSIS — E1165 Type 2 diabetes mellitus with hyperglycemia: Secondary | ICD-10-CM

## 2018-04-30 DIAGNOSIS — IMO0002 Reserved for concepts with insufficient information to code with codable children: Secondary | ICD-10-CM

## 2018-04-30 DIAGNOSIS — I10 Essential (primary) hypertension: Secondary | ICD-10-CM

## 2018-04-30 DIAGNOSIS — F17200 Nicotine dependence, unspecified, uncomplicated: Secondary | ICD-10-CM

## 2018-04-30 MED ORDER — METOPROLOL SUCCINATE ER 50 MG PO TB24: 50.0000 mg | ORAL_TABLET | Freq: Every day | ORAL | 3 refills | Status: DC

## 2018-04-30 MED ORDER — AMLODIPINE BESYLATE 5 MG PO TABS: 5.0000 mg | ORAL_TABLET | Freq: Every day | ORAL | 3 refills | Status: DC

## 2018-04-30 MED ORDER — INSULIN DEGLUDEC 100 UNIT/ML ~~LOC~~ SOPN: 14.0000 [IU] | PEN_INJECTOR | SUBCUTANEOUS | 0 refills | Status: DC

## 2018-04-30 MED ORDER — INSULIN ASPART 100 UNIT/ML ~~LOC~~ SOLN: 3.0000 [IU] | Freq: Two times a day (BID) | SUBCUTANEOUS | 0 refills | Status: DC

## 2018-04-30 NOTE — Patient Instructions (Signed)
Thank you for visiting [place] today!  1. DECREASE Tresiba to 14 units every morning.  2. CHANGE Novolog to 3 units before breakfast, 3 units before lunch, and 7 units before dinner.  3. START metoprolol succinate . Take 1 tablet by mouth every day. You may continue to take metoprolol tartrate  twice daily until your supply runs out.  4. START amlodipine . Take 1 tablet by mouth every day.  Please follow-up with Dr. Artist Pais in 3-4 weeks.

## 2018-04-30 NOTE — Assessment & Plan Note (Signed)
Hypertension longstanding since 2012 currently uncontrolled on current medications. BP Goal = 130/80 mmHg. Patient is adherent with lisinopril-HCTZ, but self-discontinued carvedilol due to adverse effects. Control is suboptimal due to suboptimized therapy regimen, including self-discontinuation of medication. -Switched metoprolol tartrate to metoprolol succinate  once daily. -Started amlodipine  once daily. -Continued lisinopril-HCTZ 20-25mg  once daily.  -Counseled on lifestyle modifications for blood pressure control including reduced dietary sodium, increased exercise, adequate sleep

## 2018-04-30 NOTE — Assessment & Plan Note (Signed)
Continues to be contemplative RE cessation at this time.  Encouraged continued intake reduction plan.

## 2018-04-30 NOTE — Assessment & Plan Note (Signed)
Diabetes longstanding since 2013 currently uncontrolled. Patient is able to verbalize appropriate hypoglycemia management plan. Patient is adherent with medication. Control is suboptimal due to diet, stress, and suboptimal therapy regimen. -Decreased dose of basal insulin Tresiba (insulin degludec) from 16-20 units daily to 14 units daily.  - Changed rapid insulin Novolog (insulin aspart) from 6 units BID to 3 units before breakfast and lunch and 7 units before dinner (daily total: 13 units).  -Continue metformin  BID. Continue to monitor CrCl. -Extensively discussed pathophysiology of DM, recommended lifestyle interventions, dietary effects on glycemic control -Counseled on s/sx of and management of hypoglycemia -Next A1C anticipated at next clinic visit (05/2018).

## 2018-04-30 NOTE — Progress Notes (Signed)
S:     Chief Complaint  Patient presents with  . Medication Management    diabetes and hypertension    Patient arrives in good spirits ambulating without assistance.  Presents for diabetes evaluation, education, and management at the request of Dr. Artist Pais. Patient was referred on 03/12/2018.   Patient reports satisfaction with his recent switch from Levemir to [place] as his home CBG readings have been more consistently controlled in the 100s with some in the 200s. He is currently taking 16 units of his [place] every morning. He reports that his FBG is 77 when he takes 20 units (self reduced to 16). Also reports FBG in the 140s-160s when he takes 16 units daily. He endorses taking 6 units of novolog before breakfast 8-9 am and dinner at 6-8pm. He reports feeling low at ~12pm before lunch.   Patient was recently switched from metoprolol tartrate  BID to carvedilol 3.125mg  daily at his last clinic visit. However, the carvedilol caused intolerable GI discomfort (nausea, diarrhea) and hip pain, so he stopped taking this medication. Self-restarted metoprolol tartrate  BID.  Patient is still thinking about smoking cessation and expresses his motivation to be his grandchild who is now 26.17 years old. After discussion, he understands the importance of smoking cessation and will consider it more in the future.   Patient reports adherence with medications.  Current diabetes medications include: metformin  BID, Tresiba 16 units QAM, and Novolog 6 units BID. Current hypertension medications include: metoprolol tartrate  BID, lisinopril-HCTZ 20-25mg  daily.  Patient reports hypoglycemic events and feeling low right before eating lunch. Reports that eating food makes him feel better quickly.  Patient reported dietary habits: Eats 3 meals/day Breakfast: Cereal or biscuit Dinner is largest meal Drinks: water   Patient denies nocturia.    O:  Physical Exam  Constitutional: He appears  well-developed and well-nourished.    Review of Systems  All other systems reviewed and are negative.    Lab Results  Component Value Date   HGBA1C 10.7 (H) 01/31/2018   Vitals:   04/30/18 0950  BP: (!) 172/76  Pulse: (!) 56  SpO2: 99%    Lipid Panel     Component Value Date/Time   CHOL 188 07/14/2016 0847   TRIG 88 07/14/2016 0847   HDL 60 07/14/2016 0847   CHOLHDL 3.1 07/14/2016 0847   VLDL 18 07/14/2016 0847   LDLCALC 110 07/14/2016 0847   LDLDIRECT 105 09/19/2016 0922    Home CBGs: 14-day average 193; ranges  Clinical ASCVD: No  ASCVD risk factors : age 64-75 10 year ASCVD risk: 43.0%   BMET    Component Value Date/Time   NA 144 01/31/2018 1631   K 5.4 (H) 01/31/2018 1631   CL 107 (H) 01/31/2018 1631   CO2 22 01/31/2018 1631   GLUCOSE 72 01/31/2018 1631   GLUCOSE 319 (H) 07/15/2016 0843   BUN 43 (H) 01/31/2018 1631   CREATININE 1.96 (H) 01/31/2018 1631   CREATININE 1.57 (H) 07/14/2016 0847   CALCIUM 10.2 01/31/2018 1631   GFRNONAA 35 (L) 01/31/2018 1631   GFRNONAA 62 08/12/2015 0943   GFRAA 41 (L) 01/31/2018 1631   GFRAA 71 08/12/2015 0943     A/P: Diabetes longstanding since 2013 currently uncontrolled. Patient is able to verbalize appropriate hypoglycemia management plan. Patient is adherent with medication. Control is suboptimal due to diet, stress, and suboptimal therapy regimen. -Decreased dose of basal insulin Tresiba (insulin degludec) from 16-20 units daily to 14 units daily.  -  Changed rapid insulin Novolog (insulin aspart) from 6 units BID to 3 units before breakfast and lunch and 7 units before dinner (daily total: 13 units).  -Continue metformin  BID. Continue to monitor CrCl. -Extensively discussed pathophysiology of DM, recommended lifestyle interventions, dietary effects on glycemic control -Counseled on s/sx of and management of hypoglycemia -Next A1C anticipated at next clinic visit (05/2018).   ASCVD risk - primary  prevention in patient with DM. Last LDL is not controlled. ASCVD risk score is >20%  - high intensity statin indicated. Aspirin is indicated. Educated patient on risks and benefits of aspirin therapy. -Continued aspirin 81 mg  -Continued Pravastatin 20 mg three times weekly.   Hypertension longstanding since 2012 currently uncontrolled on current medications. BP Goal = 130/80 mmHg. Patient is adherent with lisinopril-HCTZ, but self-discontinued carvedilol due to adverse effects. Control is suboptimal due to suboptimized therapy regimen, including self-discontinuation of medication. -Switched metoprolol tartrate to metoprolol succinate  once daily. -Started amlodipine  once daily. -Continued lisinopril-HCTZ 20-25mg  once daily.  -Counseled on lifestyle modifications for blood pressure control including reduced dietary sodium, increased exercise, adequate sleep   Written patient instructions provided.  Total time in face to face counseling 30 minutes.   Follow up PCP Clinic Visit in 3-4 weeks.   Patient seen with Tama Headings, PharmD Candidate.

## 2018-05-02 ENCOUNTER — Telehealth: Payer: Self-pay | Admitting: *Deleted

## 2018-05-02 DIAGNOSIS — M545 Low back pain, unspecified: Secondary | ICD-10-CM

## 2018-05-02 MED ORDER — HYDROCODONE-ACETAMINOPHEN 10-325 MG PO TABS: 1.0000 | ORAL_TABLET | Freq: Three times a day (TID) | ORAL | 0 refills | Status: DC | PRN

## 2018-05-02 NOTE — Telephone Encounter (Signed)
[place] PMP reviewed. No red flags. 30d refill sent.

## 2018-05-02 NOTE — Telephone Encounter (Signed)
Patient calling regarding recent hydrocodone refill.  States there was a mistake with the quantity due to he was only given 10 day supply instead of 30 days.  Will route note to Dr. Artist Pais and call patient back with Rx status.  Altamese Dilling, BSN, RN-BC

## 2018-05-08 NOTE — Telephone Encounter (Signed)
[facility] shows it can be refilled for 30 days but it cannot be filled until 05-10-18. He has already run out of his 10 day supply.  Pt would like to be able to get this Rx and his metformin today a the same time. Please advise.  [facility]

## 2018-05-08 NOTE — Telephone Encounter (Signed)
Patient already has a script at the pharmacy dated for 05-10-18, will check with MD to see if she will give the verbal ok to have them fill it 1 day early.  Bryona Foxworthy,CMA

## 2018-05-08 NOTE — Telephone Encounter (Signed)
Lawson Fiscal called again about the Norco.  She and arthor are going out ot town tomorrow and would like to get the refills before they go.  Please advise

## 2018-05-08 NOTE — Telephone Encounter (Signed)
Will forward to MD to check on this script.  Jazmin Hartsell,CMA

## 2018-05-09 NOTE — Telephone Encounter (Signed)
Patient and wife called again regarding this medication.  Called pharmacy and asked them to please fill it today since it is due to pick up tomorrow.  Patient is aware of this. Ethelean Colla,CMA

## 2018-05-09 NOTE — Telephone Encounter (Signed)
Called pharmacy, they stated that someone had called and approved the RX to be filled 1 day early today.

## 2018-05-12 ENCOUNTER — Encounter: Payer: Self-pay | Admitting: Physician Assistant

## 2018-05-12 ENCOUNTER — Ambulatory Visit: Payer: Self-pay | Admitting: Physician Assistant

## 2018-05-12 VITALS — BP 140/60 | HR 61 | Temp 97.7°F | Ht 72.0 in | Wt 160.6 lb

## 2018-05-12 DIAGNOSIS — S0591XA Unspecified injury of right eye and orbit, initial encounter: Secondary | ICD-10-CM

## 2018-05-12 NOTE — Patient Instructions (Addendum)
Take 400-600 mg of Ibuprofen every 6-8 hours as needed for eye pain.     IF you received an x-ray today, you will receive an invoice from [facility]. Please contact [facility] at [phone] with questions or concerns regarding your invoice.   IF you received labwork today, you will receive an invoice from [facility]. Please contact [facility] at [phone] with questions or concerns regarding your invoice.   Our billing staff will not be able to assist you with questions regarding bills from these companies.  You will be contacted with the lab results as soon as they are available. The fastest way to get your results is to activate your My Chart account. Instructions are located on the last page of this paperwork. If you have not heard from us regarding the results in 2 weeks, please contact this office.

## 2018-05-12 NOTE — Progress Notes (Signed)
    05/14/2018 9:02 AM   DOB: [birth year] / MRN: [id number]  SUBJECTIVE:  Colin KaufmanDavid W Keller is a 62 y.o. male presenting for right medial eye pain after "something hit me in the eye."  He denies vision changes.  He denies photophobia and eye redness.  This happened today at about 10 AM.  He does not feel it is getting worse.  He is allergic to amitriptyline; carvedilol; cyclobenzaprine; nortriptyline hcl; and simvastatin.   He  has a past medical history of Diabetes mellitus, GERD (gastroesophageal reflux disease), Hyperlipidemia, Hypertension, and Tobacco abuse.    He  reports that he has been smoking cigarettes.  He started smoking about 47 years ago. He has a 22.50 pack-year smoking history. He has never used smokeless tobacco. He reports that he does not drink alcohol or use drugs. He  reports that he currently engages in sexual activity. The patient  has no past surgical history on file.  His family history includes Diabetes in his mother; Heart disease in his father and mother.  ROS per HPI The problem list and medications were reviewed and updated by myself where necessary and exist elsewhere in the encounter.   OBJECTIVE:  BP 140/60 (BP Location: Left Arm, Patient Position: Sitting, Cuff Size: Normal)   Pulse 61   Temp 97.7 F (36.5 C) (Oral)   Ht 6' (1.829 m)   Wt 160 lb 9.6 oz (72.8 kg)   SpO2 100%   BMI 21.78 kg/m   No exam data present   Physical Exam  Constitutional: He is oriented to person, place, and time. He appears well-developed. He does not appear ill.  Eyes: Pupils are equal, round, and reactive to light. Conjunctivae and EOM are normal. Right eye exhibits no chemosis, no discharge, no exudate and no hordeolum. No foreign body present in the right eye. Right conjunctiva is not injected. Right conjunctiva has no hemorrhage. No scleral icterus. Right eye exhibits normal extraocular motion and no nystagmus. Right pupil is round and reactive.  Fundoscopic exam:      The  right eye shows no hemorrhage. The right eye shows no red reflex.  Slit lamp exam:      The right eye shows no corneal abrasion, no corneal flare, no corneal ulcer, no foreign body and no fluorescein uptake.  Cardiovascular: Normal rate.  Pulmonary/Chest: Effort normal.  Abdominal: He exhibits no distension.  Musculoskeletal: Normal range of motion.  Neurological: He is alert and oriented to person, place, and time. No cranial nerve deficit. Coordination normal.  Skin: Skin is warm and dry. He is not diaphoretic.  Psychiatric: He has a normal mood and affect.  Nursing note and vitals reviewed.   No results found for this or any previous visit (from the past 72 hour(s)).  No results found.  ASSESSMENT AND PLAN:  [name] was seen today for right eye.  Diagnoses and all orders for this visit:  Right eye injury, initial encounter: Eye exam normal.  I have tried to reassure him today.  Advised he can come back if needed.    The patient is advised to call or return to clinic if he does not see an improvement in symptoms, or to seek the care of the closest emergency department if he worsens with the above plan.   [name], MHS, PA-C Primary [facility] at [facility] 05/14/2018 9:02 AM

## 2018-06-05 ENCOUNTER — Other Ambulatory Visit: Payer: Self-pay | Admitting: Family Medicine

## 2018-06-05 DIAGNOSIS — I10 Essential (primary) hypertension: Secondary | ICD-10-CM

## 2018-06-06 ENCOUNTER — Encounter: Payer: Self-pay | Admitting: Family Medicine

## 2018-06-06 ENCOUNTER — Other Ambulatory Visit: Payer: Self-pay

## 2018-06-06 ENCOUNTER — Ambulatory Visit: Payer: Self-pay | Admitting: Family Medicine

## 2018-06-06 VITALS — BP 154/64 | HR 66 | Ht 72.0 in | Wt 158.0 lb

## 2018-06-06 DIAGNOSIS — E1165 Type 2 diabetes mellitus with hyperglycemia: Secondary | ICD-10-CM

## 2018-06-06 DIAGNOSIS — M545 Low back pain, unspecified: Secondary | ICD-10-CM

## 2018-06-06 DIAGNOSIS — G8929 Other chronic pain: Secondary | ICD-10-CM

## 2018-06-06 DIAGNOSIS — I10 Essential (primary) hypertension: Secondary | ICD-10-CM

## 2018-06-06 DIAGNOSIS — G4701 Insomnia due to medical condition: Secondary | ICD-10-CM

## 2018-06-06 LAB — POCT GLYCOSYLATED HEMOGLOBIN (HGB A1C): HbA1c, POC (controlled diabetic range): 9.5 % — AB (ref 0.0–7.0)

## 2018-06-06 MED ORDER — AMLODIPINE BESYLATE 5 MG PO TABS: 5.0000 mg | ORAL_TABLET | Freq: Every day | ORAL | 3 refills | Status: DC

## 2018-06-06 MED ORDER — HYDROCODONE-ACETAMINOPHEN 10-325 MG PO TABS: 1.0000 | ORAL_TABLET | Freq: Three times a day (TID) | ORAL | 0 refills | Status: DC | PRN

## 2018-06-06 NOTE — Patient Instructions (Signed)
It was good to see you today!  Great job on your progress on diabetic control. - Please follow up with Dr. Raymondo BandKoval in 6 weeks - See me in 3 months for repeat a1c  Please start amlodipine 5mg  daily.  Sign up for My Chart to have easy access to your labs results, and communication with your primary care physician.  Feel free to call with any questions or concerns at any time, at [phone].   Take care,  Dr. Leland HerElsia J Yassen Kinnett, DO [facility]

## 2018-06-06 NOTE — Progress Notes (Signed)
Subjective:  Colin KaufmanDavid W Keller is a 62 y.o. male who [facility] diabetes and hypertension follow-up  HPI:  Diabetes Last seen in pharmacy clinic on 04/30/18.  His Evaristo Buryresiba was decreased from 16-20, to 14 units daily NovoLog was changed from 6U TID to 3/3/7 U  He was continued on metformin 1000 mg twice daily Patient states that he has been able to be compliant with all of these changes.   He checks his sugars at home regularly.  He has his home log with him today Morning sugars prior to the medication changes above used to range 73-330, avg 180-190s. Now range 80-295, avg low 200s Evening sugars prior to medication changes above used to range 77-250, avg 150s. Now 87-383, avg mid 200s Glucometer is given him his 14-day average of 203 improved from his 30-day average of 263. He has had a  symptomatic [place]s at [address].  And his other low reading was at 87 but this was not symptomatic. Patient is hopeful for continued A1c improvement as he has high motivation from his current progress.  He plans to eat healthier as he has access to more vegetables during the summer. No polyuria, polydipsia, chest pain, shortness of breath, acute vision changes, peripheral neuropathy  Hypertension Has continued to be compliant lisinopril 20 mg daily and HCTZ 25 mg daily and Toprol 50 mg daily.  He did not realize he was supposed to start amlodipine 5 mg daily. He has not had any dizziness or lightheadedness.  He has not had any TIAs or headaches.  He has not had any lower extremity edema  Insomnia Long standing insomnia going back for years.  He would like to discuss this today as he is wondering if he has any options. He states that he has no trouble with sleep initiation.  He has good sleep hygiene where he does not watch any screens an hour before bed, keeps his bedroom a cool dark place, does not have loud noises or TVs but rather keeps his sleeping environment quiet. His issue is with sleep  maintenance, he frequently wakes up in the middle of the night for no reason and has trouble falling back asleep.  He does not have any heartburn, shortness of breath, PND, snoring. He does not want to be on any harmful medications however he did recall remotely in the past trying 1 of his wife's benzodiazepine pills which allowed him to sleep well throughout the night.  He recalls being mildly groggy on first waking up after taking this medication but he felt that it soon cleared.  He voices some hope that this may be an option for him however he knows that mixing medications with his chronic opiates for his back pain would be a bad idea.   ROS: Per HPI  PMH: He reports that he has been smoking cigarettes.  He started smoking about 47 years ago. He has a 22.50 pack-year smoking history. He has never used smokeless tobacco. He reports that he does not drink alcohol or use drugs.  Objective:  Physical Exam: BP (!) 154/64   Pulse 66   Ht 6' (1.829 m)   Wt 158 lb (71.7 kg)   SpO2 98%   BMI 21.43 kg/m   Gen: NAD, resting comfortably CV: RRR with no murmurs appreciated Pulm: NWOB, CTAB with no crackles, wheezes, or rhonchi GI: Normal bowel sounds present. Soft, Nontender, Nondistended. MSK: no edema, cyanosis, or clubbing noted Skin: warm, dry Neuro:  grossly normal, moves all extremities Psych: Normal affect and thought content   Assessment/Plan:  DM (diabetes mellitus), type 2, uncontrolled (HCC) Uncontrolled.  However A1c improved to 9.5 today from 10.7 in  February.  Given his few hypoglycemic events.  Will not change medication at this visit. -Continue metformin 1000 mg twice daily -Continue NovoLog 3 units before breakfast and lunch, 7 units before dinner -Continue Tresiba 14 units daily -Discussed dietary modifications -Follow-up in pharmacy clinic in 6 weeks  HYPERTENSION, BENIGN Uncontrolled, not at goal.  Patient unfortunately did not realize he was supposed to start  amlodipine after last visit. -Continue Toprol 50 mg once daily -Continue lisinopril- HCTZ 20-25 mg once daily -Recommended addition of amlodipine 5 mg daily, patient voiced good understanding  Insomnia due to medical condition Unfortunately patient is not a candidate for any sleep aids for multiple reasons.  Benzodiazepines contraindicated given he is on chronic opiates, patient had tried this medication out of ignorance in the past.  He states that his chronic pain is a more significant issue than his insomnia at this time.  Per chart review questionable if he had tolerated nortriptyline in the past due to slowing of urinary flow.  Discussed continued good sleep hygiene as patient is very functional and it does not impede his work.  Encounter for chronic pain management Stable and controlled.  [place] PMP reviewed.  No red flags.  Patient was counseled on his chronic opiate use and avoidance of concomitant benzodiazepines.  He voiced good understanding and stated that he would prefer to manage his chronic pain with medications rather than his insomnia.  Refill given today   [name], DO PGY-2, [facility] 06/06/2018 2:07 PM

## 2018-06-08 NOTE — Assessment & Plan Note (Signed)
Uncontrolled.  However A1c improved to 9.5 today from 10.7 in  February.  Given his few hypoglycemic events.  Will not change medication at this visit. -Continue metformin 1000 mg twice daily -Continue NovoLog 3 units before breakfast and lunch, 7 units before dinner -Continue Tresiba 14 units daily -Discussed dietary modifications -Follow-up in pharmacy clinic in 6 weeks

## 2018-06-08 NOTE — Assessment & Plan Note (Addendum)
Unfortunately patient is not a candidate for any sleep aids for multiple reasons.  Benzodiazepines contraindicated given he is on chronic opiates, patient had tried this medication out of ignorance in the past.  He states that his chronic pain is a more significant issue than his insomnia at this time.  Per chart review questionable if he had tolerated nortriptyline in the past due to slowing of urinary flow.  Discussed continued good sleep hygiene as patient is very functional and it does not impede his work.

## 2018-06-08 NOTE — Assessment & Plan Note (Addendum)
Stable and controlled.  [place] PMP reviewed.  No red flags.  Patient was counseled on his chronic opiate use and avoidance of concomitant benzodiazepines.  He voiced good understanding and stated that he would prefer to manage his chronic pain with medications rather than his insomnia.  Refill given today

## 2018-06-08 NOTE — Assessment & Plan Note (Signed)
Uncontrolled, not at goal.  Patient unfortunately did not realize he was supposed to start amlodipine after last visit. -Continue Toprol 50 mg once daily -Continue lisinopril- HCTZ 20-25 mg once daily -Recommended addition of amlodipine 5 mg daily, patient voiced good understanding

## 2018-06-15 ENCOUNTER — Telehealth: Payer: Self-pay | Admitting: *Deleted

## 2018-06-15 DIAGNOSIS — E1165 Type 2 diabetes mellitus with hyperglycemia: Secondary | ICD-10-CM

## 2018-06-15 DIAGNOSIS — E1142 Type 2 diabetes mellitus with diabetic polyneuropathy: Secondary | ICD-10-CM

## 2018-06-15 DIAGNOSIS — IMO0002 Reserved for concepts with insufficient information to code with codable children: Secondary | ICD-10-CM

## 2018-06-15 DIAGNOSIS — Z794 Long term (current) use of insulin: Secondary | ICD-10-CM

## 2018-06-15 MED ORDER — INSULIN DEGLUDEC 100 UNIT/ML ~~LOC~~ SOPN: 14.0000 [IU] | PEN_INJECTOR | SUBCUTANEOUS | 0 refills | Status: DC

## 2018-06-15 NOTE — Telephone Encounter (Signed)
[facility] [place] [facility]  He requested a few more samples to "hold him over".  Placed 2 pens in the fridge with his name on them.  Called and lmovm informing him. Selma Rodelo, Maryjo RochesterJessica Dawn, CMA

## 2018-07-05 ENCOUNTER — Other Ambulatory Visit: Payer: Self-pay | Admitting: *Deleted

## 2018-07-05 DIAGNOSIS — M545 Low back pain, unspecified: Secondary | ICD-10-CM

## 2018-07-05 MED ORDER — HYDROCODONE-ACETAMINOPHEN 10-325 MG PO TABS: 1.0000 | ORAL_TABLET | Freq: Three times a day (TID) | ORAL | 0 refills | Status: DC | PRN

## 2018-07-06 ENCOUNTER — Encounter: Payer: Self-pay | Admitting: Pharmacist

## 2018-07-06 ENCOUNTER — Ambulatory Visit: Payer: Self-pay | Admitting: Pharmacist

## 2018-07-06 VITALS — BP 142/58 | HR 84 | Ht 71.0 in | Wt 153.6 lb

## 2018-07-06 DIAGNOSIS — I10 Essential (primary) hypertension: Secondary | ICD-10-CM

## 2018-07-06 DIAGNOSIS — E1165 Type 2 diabetes mellitus with hyperglycemia: Secondary | ICD-10-CM

## 2018-07-06 DIAGNOSIS — IMO0002 Reserved for concepts with insufficient information to code with codable children: Secondary | ICD-10-CM

## 2018-07-06 DIAGNOSIS — E785 Hyperlipidemia, unspecified: Secondary | ICD-10-CM

## 2018-07-06 DIAGNOSIS — Z794 Long term (current) use of insulin: Secondary | ICD-10-CM

## 2018-07-06 DIAGNOSIS — R399 Unspecified symptoms and signs involving the genitourinary system: Secondary | ICD-10-CM

## 2018-07-06 DIAGNOSIS — E1142 Type 2 diabetes mellitus with diabetic polyneuropathy: Secondary | ICD-10-CM

## 2018-07-06 MED ORDER — SEMAGLUTIDE(0.25 OR 0.5MG/DOS) 2 MG/1.5ML ~~LOC~~ SOPN: 0.2500 mg | PEN_INJECTOR | SUBCUTANEOUS | 0 refills | Status: DC

## 2018-07-06 MED ORDER — LISINOPRIL 20 MG PO TABS: 20.0000 mg | ORAL_TABLET | Freq: Every day | ORAL | 3 refills | Status: DC

## 2018-07-06 MED ORDER — INSULIN DEGLUDEC 100 UNIT/ML ~~LOC~~ SOPN: 10.0000 [IU] | PEN_INJECTOR | SUBCUTANEOUS | 0 refills | Status: DC

## 2018-07-06 MED ORDER — TAMSULOSIN HCL 0.4 MG PO CAPS: 0.4000 mg | ORAL_CAPSULE | Freq: Every day | ORAL | 3 refills | Status: DC

## 2018-07-06 NOTE — Assessment & Plan Note (Signed)
Diabetes longstanding currently uncontrolled however, slightly improved recently. Patient is able to verbalize appropriate hypoglycemia management plan. Patient is adherent with medication. Control is suboptimal due to poor medication coverage. -Decreased dose of basal insulin Tresiba (insulin degludec) to 10 units per day. If any CBG is <100, he should decrease to 8 units. -Continued dose of rapid insulin Novolog (insulin aspart), patient self-adjusting bolus insulin based on meals and sugar levels.  -Started GLP-1 Ozempic (semaglutide) at 0.25 mg once weekly. Counseled patient on how to use the pen. Patient took his first dose in office, weekly dose should be scheduled on Friday.  -Next A1C anticipated 08/2018.

## 2018-07-06 NOTE — Patient Instructions (Addendum)
It was great to see you today!!!  We're going to make a few changes:   1) START Flomax (tamsulosin) 0.4 mg DAILY for your urinary symptoms.  2) START Ozempic (semaglutide) 0.25 mg ONCE WEEKLY for your diabetes. Let's DECREASE your TRESIBA to 10 units daily. If you start to see readings in the 80s-90s, you can DECREASE Tresiba to 8 units daily.  3) INCREASE pravastatin 20 mg daily to 5 days a week 4) START lisinopril 20 mg daily instead of the combination lisinopril/hydrochlorothiazide. Try taking the lisinopril in the evening, as this might help keep your blood pressures better controlled overnight and in the early morning.  5) You can try taking the whole amlodipine tablet, now that your body is more used to the medication.    Follow up with Pharmacy Clinic in ~4 weeks to follow up with blood sugars.

## 2018-07-06 NOTE — Assessment & Plan Note (Signed)
Hypertension longstanding. As patient was having dysuria and having LUTS, lisinopril/HCTZ combo pill switched to lisinopril alone at same dose (20 mg). Also told patient to trial taking full pill of amlodipine.

## 2018-07-06 NOTE — Assessment & Plan Note (Signed)
Lower urinary tract symptoms in patient who has used and reports benefit from use of flomax (tamsulosin) in the past.  Discussed with Dr. Leveda AnnaHensel, who agreed with trial of tamsulosin 0.4mg  once daily. Reassess next visit.

## 2018-07-06 NOTE — Progress Notes (Signed)
S:     Chief Complaint  Patient presents with  . Medication Management    diabetes    Patient arrives in good spirits, ambulating without assistance.  Presents for diabetes evaluation, education, and management at the request of Dr. Artist Pais. Patient was referred on 06/06/2018.  Patient was last seen by Primary Care Provider on 06/06/2018.   Patient noting that he is only taking 0.5 tab of amlodipine (vs prescribed whole tab) as he felt dizzy and thought it was contributing to some prostate and urinary symptoms. He has also been taking his Novolog differently, cutting out the lunchtime dose, as he was seeing low number for 3-4 days straight before dinner. A big complaint at this visit for the patient  Is that he is having trouble sleeping and getting up in the middle of the night to urinate. He has also endorsed having trouble with starting urination.  Insurance coverage/medication affordability: still no information from the [place], about a couple of months ago was told to just wait for more information  Patient reports adherence with medications.  Current diabetes medications include: metformin 1000 mg BID, Tresiba 14 units every morning, Novolog 3-4 units before breakfast and 5-7 units before dinner Current hypertension medications include: amlodipine 2.5 mg daily, lisinopril/HCTZ 20/25 mg daily  Patient reports hypoglycemic events before dinner when taking 3 units before eating lunch. Reports seeing sugars in the 60-70s.   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 Goes 2-3 x per night, more than before, says this has changed after starting amlodipine.  Patient reports neuropathy. Reports more nerve pain than normal. Has used gabapentin in the past but felt like it stopped working. Patient denies visual changes. Patient reports self foot exams.   O:  Physical Exam  Constitutional: He appears well-developed and well-nourished.     Review of Systems  Genitourinary: Positive for dysuria.     Lab Results  Component Value Date   HGBA1C 9.5 (A) 06/06/2018   Vitals:   07/06/18 1137  BP: (!) 142/58  Pulse: 84  SpO2: 99%    Lipid Panel     Component Value Date/Time   CHOL 188 07/14/2016 0847   TRIG 88 07/14/2016 0847   HDL 60 07/14/2016 0847   CHOLHDL 3.1 07/14/2016 0847   VLDL 18 07/14/2016 0847   LDLCALC 110 07/14/2016 0847   LDLDIRECT 105 09/19/2016 0922    Home fasting CBG: 100-160s  2 hour post-prandial/random CBG: mostly 200s, a few times has been in 100s  A/P: Diabetes longstanding currently uncontrolled however, slightly improved recently. Patient is able to verbalize appropriate hypoglycemia management plan. Patient is adherent with medication. Control is suboptimal due to poor medication coverage. -Decreased dose of basal insulin Tresiba (insulin degludec) to 10 units per day. If any CBG is <100, he should decrease to 8 units. -Continued dose of rapid insulin Novolog (insulin aspart), patient self-adjusting bolus insulin based on meals and sugar levels.  -Started GLP-1 Ozempic (semaglutide) at 0.25 mg once weekly. Counseled patient on how to use the pen. Patient took his first dose in office, weekly dose should be scheduled on Friday.  -  Next A1C anticipated 08/2018.   Hypertension longstanding. As patient was having dysuria and having LUTS, lisinopril/HCTZ combo pill switched to lisinopril alone at same dose (20 mg). Also told patient to trial taking full pill of amlodipine.   Lower urinary tract symptoms in  patient who has used and reports benefit from use of flomax (tamsulosin) in the past.  Discussed with Dr. Leveda AnnaHensel, who agreed with trial of tamsulosin 0.4mg  once daily. Reassess next visit.   Written patient instructions provided.  Total time in face to face counseling 35 minutes.   Follow up Pharmacist Clinic Visit in 4 weeks.   Patient seen with Thomes CakeWendy Sun, PharmD Candidate and Catie Feliz Beamravis, PharmD, PGY2 Pharmacy Resident.

## 2018-07-13 ENCOUNTER — Telehealth: Payer: Self-pay | Admitting: Pharmacist

## 2018-07-13 NOTE — Telephone Encounter (Signed)
Returned call from patient RE Colin Keller.  Paient reports some nausea with mild anorexia last week after first dose of Ozempic.  Patient also states he had some arm and chest pain which was concerning for him.  This pain only lasted one day without return.   We discussed common symptoms including nausea and concerning symptoms including pancreatitis and thyroid tumors.  He was appreciative of the discussion.   I suggested we try a lower dose of Ozempic (3-4 clicks less than the 0.25mg  dose) and attempt another dose (planned for tomorrow AM).  He was willing to try another dose as his blood sugars were improved with the use of Ozempic.  I reinforced that repeat dosing typically improves GI tolerability and lower dose may also help.  Plan for phone call follow up on in 3 days.

## 2018-07-16 ENCOUNTER — Telehealth: Payer: Self-pay | Admitting: Pharmacist

## 2018-07-16 NOTE — Telephone Encounter (Signed)
Follow up phone call for plan from 8/2 to reinitiate Ozempic at lower dose.   Patient shared that he did NOT yet restart Ozempic and his blood sugars have been much improved with his past doses of insulin 14 long acting and 4 units with meals 1-2 X per day.  He reports multiple readings < 110.   Improvement in blood sugars was nice to hear however. We also discussed potential of insulin duration of effect being extended due to kidney dysfunction.    Patient willing to reschedule for lab work.  We agreed to HOLD Ozempic at this time.  Reevaluate potential use at lower dose at next visit.   I hope he can reschedule in the next 1-2 weeks.

## 2018-07-23 NOTE — Progress Notes (Signed)
Patient ID: Colin KaufmanDavid W Keller, male   DOB: Dec 28, 1955, 62 y.o.   MRN: [id number] Reviewed: Agree with Dr. Macky LowerKoval's documentation and management.

## 2018-07-26 ENCOUNTER — Ambulatory Visit: Payer: Self-pay | Admitting: Pharmacist

## 2018-07-26 ENCOUNTER — Encounter: Payer: Self-pay | Admitting: Pharmacist

## 2018-07-26 DIAGNOSIS — IMO0002 Reserved for concepts with insufficient information to code with codable children: Secondary | ICD-10-CM

## 2018-07-26 DIAGNOSIS — R399 Unspecified symptoms and signs involving the genitourinary system: Secondary | ICD-10-CM

## 2018-07-26 DIAGNOSIS — E1165 Type 2 diabetes mellitus with hyperglycemia: Secondary | ICD-10-CM

## 2018-07-26 DIAGNOSIS — E1142 Type 2 diabetes mellitus with diabetic polyneuropathy: Secondary | ICD-10-CM

## 2018-07-26 DIAGNOSIS — I10 Essential (primary) hypertension: Secondary | ICD-10-CM

## 2018-07-26 DIAGNOSIS — Z794 Long term (current) use of insulin: Secondary | ICD-10-CM

## 2018-07-26 MED ORDER — AMLODIPINE BESYLATE 10 MG PO TABS: 10.0000 mg | ORAL_TABLET | Freq: Every day | ORAL | 3 refills | Status: DC

## 2018-07-26 MED ORDER — TAMSULOSIN HCL 0.4 MG PO CAPS: 0.4000 mg | ORAL_CAPSULE | Freq: Every day | ORAL | 3 refills | Status: DC

## 2018-07-26 MED ORDER — INSULIN DEGLUDEC 100 UNIT/ML ~~LOC~~ SOPN: 12.0000 [IU] | PEN_INJECTOR | SUBCUTANEOUS | 0 refills | Status: DC

## 2018-07-26 MED ORDER — INSULIN ASPART 100 UNIT/ML ~~LOC~~ SOLN: 3.0000 [IU] | Freq: Two times a day (BID) | SUBCUTANEOUS | 0 refills | Status: DC

## 2018-07-26 NOTE — Assessment & Plan Note (Signed)
Diabetes longstanding currently uncontrolled. Patient is able to verbalize appropriate hypoglycemia management plan. Patient is adherent with medication. Control is suboptimal due to poor medication coverage. 1. Decrease Tresiba (Insulin Degludec) 12 units daily.  2. Increase Novolog (Insulin Aspart) at a sliding scale of 4-5 units with breakfast and 4-7 units with dinner.  -Counseled on s/sx of and management of hypoglycemia -Next A1C anticipated 08/2018.

## 2018-07-26 NOTE — Patient Instructions (Addendum)
It was nice seeing you today!  1. Restart taking tamsulosin 0.4mg  daily.  2. Decrease Tresiba (Insulin Degludec) 12 units daily.  3. Increase Novolog (Insulin Aspart) 4-5 units with breakfast and 4-7 units with dinner 4. Start Senna for constipation as needed (this is available over the counter).   5. Increase Amlodipine 10 mg (take 2 tablets of the 5 mg tablets until out) daily.

## 2018-07-26 NOTE — Assessment & Plan Note (Signed)
BPH/ LUTS:  1. Tamsulosin trial seemed to provide improvement for patient. Restart tamsulosin 0.4 mg daily.

## 2018-07-26 NOTE — Assessment & Plan Note (Signed)
  Hypertension longstanding currently uncontrolled at 156/60.  BP goal = <130/80 mmHg. Patient is adherent with medication. Control is suboptimal due to suboptimal dosing of medications. 1. Increase Amlodipine 10 mg (take 2 tablets of the 5 mg tablets until out) daily. Sent a prescription for 10 mg once patient is out of current prescription.  Will NOT restart metoprolol at this time as patient is no longer taking.

## 2018-07-26 NOTE — Progress Notes (Signed)
    S:     Chief Complaint  Patient presents with  . Medication Management    Diabetes    Patient arrives in good spirits ambulating without assistance.  Presents for diabetes evaluation, education, and management at the request of Dr. [name]. Patient was referred on and last seen by Dr. [name] on 06/06/18.   Patient reports adherence with medications.  Current diabetes medications include: Tresiba (insulin degludec) 14 units daily, Novolog (insulin aspart 3-4 units with breakfast and 3-6 units with dinner), Metformin 1000 mg BID Current hypertension medications include: amlodipine 5 mg daily, lisinopril 20mg  daily  Patient also mentions he has tried the tamsulosin 0.4 mg daily we started him on last visit. He mentions only using for one week but reports noticing improvement while on it.   Patient reports GI symptoms with Ozempic (semeglutide), even at a few clicks less than 0.25 mg. He also reports constipation that he believes got worse after starting Ozempic but is something he experiences occasionally.   Patient reports hypoglycemic events. Patient reports one episode of hypoglycemia this week. Symptoms included vision changes and shakiness.      O:  Physical Exam  Constitutional: He appears well-developed and well-nourished.  Musculoskeletal: He exhibits no edema.  Vitals reviewed.    Review of Systems  All other systems reviewed and are negative.    Lab Results  Component Value Date   HGBA1C 9.5 (A) 06/06/2018   There were no vitals filed for this visit.  Lipid Panel     Component Value Date/Time   CHOL 188 07/14/2016 0847   TRIG 88 07/14/2016 0847   HDL 60 07/14/2016 0847   CHOLHDL 3.1 07/14/2016 0847   VLDL 18 07/14/2016 0847   LDLCALC 110 07/14/2016 0847   LDLDIRECT 105 09/19/2016 0922    Fasting BG ranging from 77-300s Random BG ranging in 200s and 300s One low this week was 66. Patient reports vision changes and shakiness with lows.   Clinical ASCVD: No    ASCVD risk factors : age 650-75  A/P: Diabetes longstanding currently uncontrolled. Patient is able to verbalize appropriate hypoglycemia management plan. Patient is adherent with medication. Control is suboptimal due to poor medication coverage. 1. Decrease Tresiba (Insulin Degludec) 12 units daily.  2. Increase Novolog (Insulin Aspart) at a sliding scale of 4-5 units with breakfast and 4-7 units with dinner.  -Counseled on s/sx of and management of hypoglycemia -Next A1C anticipated 08/2018.   Hypertension longstanding currently uncontrolled at 156/60.  BP goal = <130/80 mmHg. Patient is adherent with medication. Control is suboptimal due to suboptimal dosing of medications. 1. Increase Amlodipine 10 mg (take 2 tablets of the 5 mg tablets until out) daily. Sent a prescription for 10 mg once patient is out of current prescription.  Will NOT restart metoprolol at this time as patient is no longer taking.   BPH/ LUTS:  1. Tamsulosin trial seemed to provide improvement for patient. Restart tamsulosin 0.4 mg daily.  Constipation likely secondary to narcotic therapy and potentially gastroparesis. 1. Start over the counter Senna for constipation as needed.   Written patient instructions provided.  Total time in face to face counseling 30 minutes.   Follow up Pharmacist in 4 weeks.   Patient seen with Caffie PintoAkshara Kumar, PharmD Candidate, Gwynneth AlbrightSara Nimer, PharmD, PGY1 Pharmacy Resident, and Catie Feliz Beamravis, PharmD,  PGY2 Pharmacy Resident.

## 2018-07-27 ENCOUNTER — Telehealth: Payer: Self-pay | Admitting: Pharmacist

## 2018-07-27 LAB — BASIC METABOLIC PANEL
BUN / CREAT RATIO: 22 (ref 10–24)
BUN: 32 mg/dL — ABNORMAL HIGH (ref 8–27)
CALCIUM: 9.5 mg/dL (ref 8.6–10.2)
CHLORIDE: 107 mmol/L — AB (ref 96–106)
CO2: 22 mmol/L (ref 20–29)
Creatinine, Ser: 1.47 mg/dL — ABNORMAL HIGH (ref 0.76–1.27)
GFR calc non Af Amer: 50 mL/min/{1.73_m2} — ABNORMAL LOW (ref >59)
GFR, EST AFRICAN AMERICAN: 58 mL/min/{1.73_m2} — AB (ref >59)
Glucose: 206 mg/dL — ABNORMAL HIGH (ref 65–99)
POTASSIUM: 5.3 mmol/L — AB (ref 3.5–5.2)
SODIUM: 141 mmol/L (ref 134–144)

## 2018-07-27 NOTE — Progress Notes (Signed)
Patient ID: Colin Keller, male   DOB: 11/08/1956, 61 y.o.   MRN: [id number] Reviewed: Agree with Dr. Koval's documentation and management. 

## 2018-07-27 NOTE — Telephone Encounter (Signed)
Lab results shared.  No change in therapy.

## 2018-07-27 NOTE — Telephone Encounter (Signed)
-----   Message from Moses MannersWilliam A Hensel, MD sent at 07/27/2018 10:39 AM EDT -----   ----- Message ----- From: Nell RangeInterface, [facility] Lab Results In Sent: 07/27/2018   8:11 AM EDT To: Moses MannersWilliam A Hensel, MD

## 2018-08-03 ENCOUNTER — Other Ambulatory Visit: Payer: Self-pay | Admitting: Family Medicine

## 2018-08-03 DIAGNOSIS — M545 Low back pain, unspecified: Secondary | ICD-10-CM

## 2018-08-03 MED ORDER — HYDROCODONE-ACETAMINOPHEN 10-325 MG PO TABS: 1.0000 | ORAL_TABLET | Freq: Three times a day (TID) | ORAL | 0 refills | Status: DC | PRN

## 2018-08-03 NOTE — Telephone Encounter (Signed)
Pt called and needs a refill on his pain medication left up front or called in. jw

## 2018-08-03 NOTE — Telephone Encounter (Signed)
[place] PMP reviewed, no red flags. Rx refilled.

## 2018-08-03 NOTE — Telephone Encounter (Signed)
Will forward to MD. Jazmin Hartsell,CMA  

## 2018-08-16 ENCOUNTER — Telehealth: Payer: Self-pay

## 2018-08-16 NOTE — Telephone Encounter (Signed)
Pt's wife called to check to see that we sent a message back to Dr. Raymondo Band to contact patient. Call back [phone] Shawna Orleans, RN

## 2018-08-16 NOTE — Telephone Encounter (Signed)
Pt called nurse line, requesting call back from Dr. Raymondo Band regarding some new medications. Pt call back [phone] Shawna Orleans, RN

## 2018-08-18 ENCOUNTER — Encounter (HOSPITAL_COMMUNITY): Payer: Self-pay

## 2018-08-18 ENCOUNTER — Inpatient Hospital Stay (HOSPITAL_COMMUNITY)
Admission: EM | Admit: 2018-08-18 | Discharge: 2018-08-28 | DRG: 233 | Disposition: A | Discharge status: Home / Self Care | Payer: Self-pay | Attending: Thoracic Surgery (Cardiothoracic Vascular Surgery) | Admitting: Thoracic Surgery (Cardiothoracic Vascular Surgery)

## 2018-08-18 ENCOUNTER — Emergency Department (HOSPITAL_COMMUNITY): Payer: Self-pay

## 2018-08-18 ENCOUNTER — Other Ambulatory Visit: Payer: Self-pay

## 2018-08-18 DIAGNOSIS — I10 Essential (primary) hypertension: Secondary | ICD-10-CM | POA: Diagnosis present

## 2018-08-18 DIAGNOSIS — K219 Gastro-esophageal reflux disease without esophagitis: Secondary | ICD-10-CM | POA: Diagnosis present

## 2018-08-18 DIAGNOSIS — I25110 Atherosclerotic heart disease of native coronary artery with unstable angina pectoris: Secondary | ICD-10-CM | POA: Diagnosis present

## 2018-08-18 DIAGNOSIS — I130 Hypertensive heart and chronic kidney disease with heart failure and stage 1 through stage 4 chronic kidney disease, or unspecified chronic kidney disease: Secondary | ICD-10-CM | POA: Diagnosis present

## 2018-08-18 DIAGNOSIS — Z951 Presence of aortocoronary bypass graft: Secondary | ICD-10-CM

## 2018-08-18 DIAGNOSIS — E1159 Type 2 diabetes mellitus with other circulatory complications: Secondary | ICD-10-CM | POA: Diagnosis present

## 2018-08-18 DIAGNOSIS — Z23 Encounter for immunization: Secondary | ICD-10-CM

## 2018-08-18 DIAGNOSIS — Z833 Family history of diabetes mellitus: Secondary | ICD-10-CM

## 2018-08-18 DIAGNOSIS — J9811 Atelectasis: Secondary | ICD-10-CM

## 2018-08-18 DIAGNOSIS — I152 Hypertension secondary to endocrine disorders: Secondary | ICD-10-CM | POA: Diagnosis present

## 2018-08-18 DIAGNOSIS — Z794 Long term (current) use of insulin: Secondary | ICD-10-CM

## 2018-08-18 DIAGNOSIS — R000 Tachycardia, unspecified: Secondary | ICD-10-CM | POA: Diagnosis not present

## 2018-08-18 DIAGNOSIS — I2510 Atherosclerotic heart disease of native coronary artery without angina pectoris: Secondary | ICD-10-CM

## 2018-08-18 DIAGNOSIS — F17210 Nicotine dependence, cigarettes, uncomplicated: Secondary | ICD-10-CM | POA: Diagnosis present

## 2018-08-18 DIAGNOSIS — E11649 Type 2 diabetes mellitus with hypoglycemia without coma: Secondary | ICD-10-CM | POA: Diagnosis not present

## 2018-08-18 DIAGNOSIS — N1832 Chronic kidney disease, stage 3b: Secondary | ICD-10-CM | POA: Diagnosis present

## 2018-08-18 DIAGNOSIS — I2584 Coronary atherosclerosis due to calcified coronary lesion: Secondary | ICD-10-CM | POA: Diagnosis present

## 2018-08-18 DIAGNOSIS — E785 Hyperlipidemia, unspecified: Secondary | ICD-10-CM | POA: Diagnosis present

## 2018-08-18 DIAGNOSIS — Z79899 Other long term (current) drug therapy: Secondary | ICD-10-CM

## 2018-08-18 DIAGNOSIS — R079 Chest pain, unspecified: Secondary | ICD-10-CM

## 2018-08-18 DIAGNOSIS — I214 Non-ST elevation (NSTEMI) myocardial infarction: Principal | ICD-10-CM | POA: Diagnosis present

## 2018-08-18 DIAGNOSIS — E1140 Type 2 diabetes mellitus with diabetic neuropathy, unspecified: Secondary | ICD-10-CM | POA: Diagnosis present

## 2018-08-18 DIAGNOSIS — N1831 Chronic kidney disease, stage 3a: Secondary | ICD-10-CM | POA: Diagnosis present

## 2018-08-18 DIAGNOSIS — T502X5A Adverse effect of carbonic-anhydrase inhibitors, benzothiadiazides and other diuretics, initial encounter: Secondary | ICD-10-CM | POA: Diagnosis not present

## 2018-08-18 DIAGNOSIS — E1169 Type 2 diabetes mellitus with other specified complication: Secondary | ICD-10-CM | POA: Diagnosis present

## 2018-08-18 DIAGNOSIS — Z8249 Family history of ischemic heart disease and other diseases of the circulatory system: Secondary | ICD-10-CM

## 2018-08-18 DIAGNOSIS — E1122 Type 2 diabetes mellitus with diabetic chronic kidney disease: Secondary | ICD-10-CM | POA: Diagnosis present

## 2018-08-18 DIAGNOSIS — I5031 Acute diastolic (congestive) heart failure: Secondary | ICD-10-CM | POA: Diagnosis present

## 2018-08-18 DIAGNOSIS — N183 Chronic kidney disease, stage 3 (moderate): Secondary | ICD-10-CM | POA: Diagnosis present

## 2018-08-18 DIAGNOSIS — E1151 Type 2 diabetes mellitus with diabetic peripheral angiopathy without gangrene: Secondary | ICD-10-CM | POA: Diagnosis present

## 2018-08-18 DIAGNOSIS — D6959 Other secondary thrombocytopenia: Secondary | ICD-10-CM | POA: Diagnosis not present

## 2018-08-18 DIAGNOSIS — Z7982 Long term (current) use of aspirin: Secondary | ICD-10-CM

## 2018-08-18 DIAGNOSIS — I4891 Unspecified atrial fibrillation: Secondary | ICD-10-CM | POA: Diagnosis not present

## 2018-08-18 DIAGNOSIS — R748 Abnormal levels of other serum enzymes: Secondary | ICD-10-CM

## 2018-08-18 DIAGNOSIS — E1165 Type 2 diabetes mellitus with hyperglycemia: Secondary | ICD-10-CM | POA: Diagnosis present

## 2018-08-18 DIAGNOSIS — Z888 Allergy status to other drugs, medicaments and biological substances status: Secondary | ICD-10-CM

## 2018-08-18 DIAGNOSIS — R944 Abnormal results of kidney function studies: Secondary | ICD-10-CM | POA: Diagnosis not present

## 2018-08-18 DIAGNOSIS — I5021 Acute systolic (congestive) heart failure: Secondary | ICD-10-CM

## 2018-08-18 DIAGNOSIS — E11319 Type 2 diabetes mellitus with unspecified diabetic retinopathy without macular edema: Secondary | ICD-10-CM | POA: Diagnosis present

## 2018-08-18 DIAGNOSIS — I509 Heart failure, unspecified: Secondary | ICD-10-CM

## 2018-08-18 DIAGNOSIS — D62 Acute posthemorrhagic anemia: Secondary | ICD-10-CM | POA: Diagnosis not present

## 2018-08-18 DIAGNOSIS — Z720 Tobacco use: Secondary | ICD-10-CM | POA: Diagnosis present

## 2018-08-18 HISTORY — DX: Tobacco use: Z72.0

## 2018-08-18 HISTORY — DX: Acute diastolic (congestive) heart failure: I50.31

## 2018-08-18 HISTORY — DX: Presence of aortocoronary bypass graft: Z95.1

## 2018-08-18 LAB — CBC
HCT: 31.1 % — ABNORMAL LOW (ref 39.0–52.0)
Hemoglobin: 9.9 g/dL — ABNORMAL LOW (ref 13.0–17.0)
MCH: 31.8 pg (ref 26.0–34.0)
MCHC: 31.8 g/dL (ref 30.0–36.0)
MCV: 100 fL (ref 78.0–100.0)
PLATELETS: 315 10*3/uL (ref 150–400)
RBC: 3.11 MIL/uL — AB (ref 4.22–5.81)
RDW: 13.4 % (ref 11.5–15.5)
WBC: 7.9 10*3/uL (ref 4.0–10.5)

## 2018-08-18 LAB — HEPATIC FUNCTION PANEL
ALBUMIN: 3.5 g/dL (ref 3.5–5.0)
ALK PHOS: 75 U/L (ref 38–126)
ALT: 22 U/L (ref 0–44)
AST: 21 U/L (ref 15–41)
BILIRUBIN TOTAL: 1 mg/dL (ref 0.3–1.2)
Bilirubin, Direct: 0.2 mg/dL (ref 0.0–0.2)
Indirect Bilirubin: 0.8 mg/dL (ref 0.3–0.9)
TOTAL PROTEIN: 6.5 g/dL (ref 6.5–8.1)

## 2018-08-18 LAB — I-STAT TROPONIN, ED: TROPONIN I, POC: 0.29 ng/mL — AB (ref 0.00–0.08)

## 2018-08-18 LAB — BASIC METABOLIC PANEL
Anion gap: 11 (ref 5–15)
BUN: 25 mg/dL — ABNORMAL HIGH (ref 8–23)
CALCIUM: 9.3 mg/dL (ref 8.9–10.3)
CO2: 22 mmol/L (ref 22–32)
CREATININE: 1.39 mg/dL — AB (ref 0.61–1.24)
Chloride: 103 mmol/L (ref 98–111)
GFR calc Af Amer: >60 mL/min (ref >60)
GFR, EST NON AFRICAN AMERICAN: 52 mL/min — AB (ref >60)
Glucose, Bld: 376 mg/dL — ABNORMAL HIGH (ref 70–99)
Potassium: 4.8 mmol/L (ref 3.5–5.1)
SODIUM: 136 mmol/L (ref 135–145)

## 2018-08-18 LAB — HEMOGLOBIN A1C
Hgb A1c MFr Bld: 9.5 % — ABNORMAL HIGH (ref 4.8–5.6)
MEAN PLASMA GLUCOSE: 225.95 mg/dL

## 2018-08-18 LAB — BRAIN NATRIURETIC PEPTIDE: B NATRIURETIC PEPTIDE 5: 1083.3 pg/mL — AB (ref 0.0–100.0)

## 2018-08-18 LAB — TSH: TSH: 0.333 u[IU]/mL — AB (ref 0.350–4.500)

## 2018-08-18 LAB — TROPONIN I
TROPONIN I: 0.38 ng/mL — AB (ref <0.03)
Troponin I: 0.32 ng/mL (ref <0.03)

## 2018-08-18 LAB — GLUCOSE, CAPILLARY: Glucose-Capillary: 316 mg/dL — ABNORMAL HIGH (ref 70–99)

## 2018-08-18 LAB — T4, FREE: Free T4: 1.34 ng/dL (ref 0.82–1.77)

## 2018-08-18 LAB — MAGNESIUM: Magnesium: 1.6 mg/dL — ABNORMAL LOW (ref 1.7–2.4)

## 2018-08-18 MED ORDER — FUROSEMIDE 10 MG/ML IJ SOLN
40.0000 mg | Freq: Once | INTRAMUSCULAR | Status: AC
  Administered 2018-08-18: 40 mg via INTRAVENOUS
  Filled 2018-08-18: qty 4

## 2018-08-18 MED ORDER — AMLODIPINE BESYLATE 5 MG PO TABS
5.0000 mg | ORAL_TABLET | Freq: Every day | ORAL | Status: DC
  Administered 2018-08-18 – 2018-08-22 (×4): 5 mg via ORAL
  Filled 2018-08-18 (×5): qty 1

## 2018-08-18 MED ORDER — INSULIN ASPART 100 UNIT/ML ~~LOC~~ SOLN
0.0000 [IU] | Freq: Three times a day (TID) | SUBCUTANEOUS | Status: DC
  Administered 2018-08-19 – 2018-08-20 (×4): 9 [IU] via SUBCUTANEOUS

## 2018-08-18 MED ORDER — PRAVASTATIN SODIUM 20 MG PO TABS
20.0000 mg | ORAL_TABLET | ORAL | Status: DC
Start: 2018-08-20 — End: 2018-08-23
  Administered 2018-08-20 – 2018-08-22 (×2): 20 mg via ORAL
  Filled 2018-08-18 (×2): qty 1

## 2018-08-18 MED ORDER — ASPIRIN EC 81 MG PO TBEC
81.0000 mg | DELAYED_RELEASE_TABLET | Freq: Every day | ORAL | Status: DC
  Administered 2018-08-19 – 2018-08-22 (×4): 81 mg via ORAL
  Filled 2018-08-18 (×4): qty 1

## 2018-08-18 MED ORDER — HEPARIN BOLUS VIA INFUSION
3500.0000 [IU] | Freq: Once | INTRAVENOUS | Status: AC
  Administered 2018-08-18: 3500 [IU] via INTRAVENOUS
  Filled 2018-08-18: qty 3500

## 2018-08-18 MED ORDER — ONDANSETRON HCL 4 MG/2ML IJ SOLN: 4.0000 mg | Freq: Four times a day (QID) | INTRAMUSCULAR | Status: DC | PRN

## 2018-08-18 MED ORDER — NITROGLYCERIN 2 % TD OINT
0.5000 [in_us] | TOPICAL_OINTMENT | Freq: Four times a day (QID) | TRANSDERMAL | Status: DC
  Administered 2018-08-18 – 2018-08-23 (×16): 0.5 [in_us] via TOPICAL
  Filled 2018-08-18: qty 1
  Filled 2018-08-18: qty 30

## 2018-08-18 MED ORDER — NITROGLYCERIN 0.4 MG SL SUBL: 0.4000 mg | SUBLINGUAL_TABLET | SUBLINGUAL | Status: DC | PRN

## 2018-08-18 MED ORDER — ALPRAZOLAM 0.25 MG PO TABS
0.2500 mg | ORAL_TABLET | Freq: Two times a day (BID) | ORAL | Status: DC | PRN
  Administered 2018-08-19 – 2018-08-22 (×4): 0.25 mg via ORAL
  Filled 2018-08-18 (×5): qty 1

## 2018-08-18 MED ORDER — ASPIRIN 81 MG PO CHEW: 324.0000 mg | CHEWABLE_TABLET | ORAL | Status: DC

## 2018-08-18 MED ORDER — ACETAMINOPHEN 325 MG PO TABS: 650.0000 mg | ORAL_TABLET | ORAL | Status: DC | PRN

## 2018-08-18 MED ORDER — HEPARIN (PORCINE) IN NACL 100-0.45 UNIT/ML-% IJ SOLN
1100.0000 [IU]/h | INTRAMUSCULAR | Status: DC
  Administered 2018-08-18: 900 [IU]/h via INTRAVENOUS
  Administered 2018-08-19 – 2018-08-20 (×2): 1100 [IU]/h via INTRAVENOUS
  Filled 2018-08-18 (×3): qty 250

## 2018-08-18 MED ORDER — FAMOTIDINE 20 MG PO TABS
20.0000 mg | ORAL_TABLET | Freq: Every day | ORAL | Status: DC
  Administered 2018-08-19 – 2018-08-22 (×4): 20 mg via ORAL
  Filled 2018-08-18 (×4): qty 1

## 2018-08-18 MED ORDER — CARVEDILOL 3.125 MG PO TABS
3.1250 mg | ORAL_TABLET | Freq: Two times a day (BID) | ORAL | Status: DC
  Administered 2018-08-19 – 2018-08-22 (×7): 3.125 mg via ORAL
  Filled 2018-08-18 (×7): qty 1

## 2018-08-18 MED ORDER — ASPIRIN 300 MG RE SUPP: 300.0000 mg | RECTAL | Status: DC

## 2018-08-18 MED ORDER — ASPIRIN 81 MG PO CHEW
324.0000 mg | CHEWABLE_TABLET | Freq: Once | ORAL | Status: AC
  Administered 2018-08-18: 324 mg via ORAL
  Filled 2018-08-18: qty 4

## 2018-08-18 MED ORDER — INSULIN ASPART 100 UNIT/ML ~~LOC~~ SOLN
0.0000 [IU] | Freq: Every day | SUBCUTANEOUS | Status: DC
  Administered 2018-08-18: 4 [IU] via SUBCUTANEOUS
  Administered 2018-08-19: 5 [IU] via SUBCUTANEOUS

## 2018-08-18 MED ORDER — SODIUM CHLORIDE 0.9 % IV SOLN
INTRAVENOUS | Status: DC
  Administered 2018-08-19: 06:00:00 via INTRAVENOUS

## 2018-08-18 MED ORDER — HYDROCODONE-ACETAMINOPHEN 10-325 MG PO TABS
1.0000 | ORAL_TABLET | Freq: Three times a day (TID) | ORAL | Status: DC | PRN
  Administered 2018-08-19 – 2018-08-22 (×5): 1 via ORAL
  Filled 2018-08-18 (×6): qty 1

## 2018-08-18 NOTE — ED Provider Notes (Signed)
[facility] EMERGENCY DEPARTMENT Provider Note   CSN: [id number] Arrival date & time: 08/18/18  1340     History   Chief Complaint Chief Complaint  Patient presents with  . Chest Pain    HPI JOSHUAH MINELLA is a 62 y.o. male who presents with chest pain and SOB. PMH significant for insulin dependent DM, HTN, HLD, CKD, chronic back pain, 1 pack a day smoker. He states that for the past 2 weeks he has had intermittent chest discomfort, especially with lying flat. He also reports progressively worsening SOB. It seems to get better when he is up and moving around or lying on his side. He feels like it's heavy pressure on his upper chest when lying flat. Currently he rates it as a 2-3. He denies hx of cardiac disease but his father had CAD and died at age 41. He reports a cough and wheezing as well. He's had intermittent bilateral peripheral edema as well which is actually improved since he stopped Amlodipine. No syncope. No prior cardiac work up other than a TEE in 2003 due to positive blood cultures.   HPI  Past Medical History:  Diagnosis Date  . Diabetes mellitus   . GERD (gastroesophageal reflux disease)   . Hyperlipidemia   . Hypertension   . Tobacco abuse     Patient Active Problem List   Diagnosis Date Noted  . Lower urinary tract symptoms (LUTS) 07/06/2018  . Encounter for chronic pain management 02/16/2015  . Diabetic neuropathy (HCC) 02/16/2015  . Chronic right shoulder pain 08/19/2014  . Diabetic retinopathy (HCC) 08/30/2013  . Insomnia due to medical condition 07/30/2013  . CKD (chronic kidney disease) stage 3, GFR 30-59 ml/min (HCC) 03/05/2013  . DM (diabetes mellitus), type 2, uncontrolled (HCC) 10/24/2012  . Colon cancer screening 10/24/2012  . AC joint dislocation 07/26/2012  . Frozen shoulder 06/13/2012  . Tobacco use disorder 07/29/2009  . HYPERTENSION, BENIGN 12/22/2008  . Hyperlipidemia 12/21/2008  . BACK PAIN, LUMBAR 10/16/2008     History reviewed. No pertinent surgical history.      Home Medications    Prior to Admission medications   Medication Sig Start Date End Date Taking? Authorizing Provider  amLODipine (NORVASC) 10 MG tablet Take 1 tablet (10 mg total) by mouth daily. 07/26/18   Moses Manners, MD  aspirin 81 MG tablet Take 81 mg by mouth daily.    [provider]  HYDROcodone-acetaminophen (NORCO) 10-325 MG tablet Take 1 tablet by mouth every 8 (eight) hours as needed. 08/03/18   [name], DO  insulin aspart (NOVOLOG) 100 UNIT/ML injection Inject 3-7 Units into the skin 2 (two) times daily with a meal. 4-5 units BKFST, 4-7 units with dinner 07/26/18   Moses Manners, MD  insulin degludec (TRESIBA FLEXTOUCH) 100 UNIT/ML SOPN FlexTouch Pen Inject 0.12 mLs (12 Units total) into the skin every morning. 07/26/18   Moses Manners, MD  lisinopril (PRINIVIL,ZESTRIL) 20 MG tablet Take 1 tablet (20 mg total) by mouth daily. 07/06/18   Moses Manners, MD  metFORMIN (GLUCOPHAGE) 1000 MG tablet Take 1 tablet (1,000 mg total) by mouth 2 (two) times daily. 01/31/18   [name], DO  pravastatin (PRAVACHOL) 20 MG tablet Take 1 tablet (20 mg total) by mouth 3 (three) times a week. 03/30/18   Moses Manners, MD  ranitidine (ZANTAC) 75 MG tablet Take 150 mg by mouth daily as needed.     [provider]  tamsulosin (  FLOMAX) 0.4 MG CAPS capsule Take 1 capsule (0.4 mg total) by mouth daily. 07/26/18   Moses Manners, MD    Family History Family History  Problem Relation Age of Onset  . Heart disease Mother   . Diabetes Mother   . Heart disease Father     Social History Social History   Tobacco Use  . Smoking status: Current Every Day Smoker    Packs/day: 0.50    Years: 45.00    Pack years: 22.50    Types: Cigarettes    Start date: 08/12/1970  . Smokeless tobacco: Never Used  . Tobacco comment: Quit for 4.5 months in August 2016  Substance Use Topics  . Alcohol use: No     Alcohol/week: 0.0 standard drinks  . Drug use: No     Allergies   Amitriptyline; Carvedilol; Cyclobenzaprine; Nortriptyline hcl; and Simvastatin   Review of Systems Review of Systems  Constitutional: Negative for fever.  Respiratory: Positive for cough, shortness of breath and wheezing.   Cardiovascular: Positive for chest pain and leg swelling. Negative for palpitations.  Gastrointestinal: Negative for abdominal pain, nausea and vomiting.  Neurological: Negative for syncope and light-headedness.  All other systems reviewed and are negative.    Physical Exam Updated Vital Signs BP (!) 154/69 (BP Location: Right Arm)   Pulse 82   Temp 98.5 F (36.9 C) (Oral)   Resp 18   SpO2 100%   Physical Exam  Constitutional: He is oriented to person, place, and time. He appears well-developed and well-nourished. No distress.  Calm, cooperative. Chronically ill appearing  HENT:  Head: Normocephalic and atraumatic.  Eyes: Pupils are equal, round, and reactive to light. Conjunctivae are normal. Right eye exhibits no discharge. Left eye exhibits no discharge. No scleral icterus.  Neck: Normal range of motion.  Cardiovascular: Normal rate and regular rhythm.  Pulmonary/Chest: Effort normal and breath sounds normal. No respiratory distress.  Abdominal: Soft. Bowel sounds are normal. He exhibits no distension. There is no tenderness.  Musculoskeletal:  Trace bilateral peripheral pitting edema  Neurological: He is alert and oriented to person, place, and time.  Skin: Skin is warm and dry.  Psychiatric: He has a normal mood and affect. His behavior is normal.  Nursing note and vitals reviewed.    ED Treatments / Results  Labs (all labs ordered are listed, but only abnormal results are displayed) Labs Reviewed  BASIC METABOLIC PANEL - Abnormal; Notable for the following components:      Result Value   Glucose, Bld 376 (*)    BUN 25 (*)    Creatinine, Ser 1.39 (*)    GFR calc non Af  Amer 52 (*)    All other components within normal limits  CBC - Abnormal; Notable for the following components:   RBC 3.11 (*)    Hemoglobin 9.9 (*)    HCT 31.1 (*)    All other components within normal limits  TROPONIN I - Abnormal; Notable for the following components:   Troponin I 0.38 (*)    All other components within normal limits  BRAIN NATRIURETIC PEPTIDE - Abnormal; Notable for the following components:   B Natriuretic Peptide 1,083.3 (*)    All other components within normal limits  I-STAT TROPONIN, ED - Abnormal; Notable for the following components:   Troponin i, poc 0.29 (*)    All other components within normal limits  HEPARIN LEVEL (UNFRACTIONATED)    EKG EKG Interpretation  Date/Time:  Saturday August 18 2018  13:51:49 EDT Ventricular Rate:  85 PR Interval:  146 QRS Duration: 108 QT Interval:  368 QTC Calculation: 437 R Axis:   77 Text Interpretation:  Normal sinus rhythm Possible Inferior infarct , age undetermined Abnormal ECG No significant change since last tracing Confirmed by Jacalyn Lefevre [phone]) on 08/18/2018 2:06:47 PM   Radiology No results found.  Procedures Procedures (including critical care time)  CRITICAL CARE Performed by: Bethel Born   Total critical care time: 35 minutes  Critical care time was exclusive of separately billable procedures and treating other patients.  Critical care was necessary to treat or prevent imminent or life-threatening deterioration.  Critical care was time spent personally by me on the following activities: development of treatment plan with patient and/or surrogate as well as nursing, discussions with consultants, evaluation of patient's response to treatment, examination of patient, obtaining history from patient or surrogate, ordering and performing treatments and interventions, ordering and review of laboratory studies, ordering and review of radiographic studies, pulse oximetry and re-evaluation of  patient's condition.   Medications Ordered in ED Medications  heparin bolus via infusion 3,500 Units (has no administration in time range)  heparin ADULT infusion 100 units/mL (25000 units/264mL sodium chloride 0.45%) (has no administration in time range)  aspirin chewable tablet 324 mg (324 mg Oral Given 08/18/18 1441)     Initial Impression / Assessment and Plan / ED Course  I have reviewed the triage vital signs and the nursing notes.  Pertinent labs & imaging results that were available during my care of the patient were reviewed by me and considered in my medical decision making (see chart for details).  62 year old year old male presents with worsening chest discomfort/SOB. He is hypertensive but otherwise vitals are normal. Exams is remarkable for some lower extremity edema. EKG is SR with q waves in inferior leads. Initial poc troponin is .29. CBC is remarkable for anemia which is worse than last value. BMP is remarkable for hyperglycemia and elevated kidney function which is better than baseline. Heparin per pharmacy, ASA ordered. The patient still has active 2-3/10 chest pain.  3:02 PM Discussed with Dr. Jens Som who will come to see the patient.   Final Clinical Impressions(s) / ED Diagnoses   Final diagnoses:  NSTEMI (non-ST elevated myocardial infarction) (HCC)  Chest pain, unspecified type  Congestive heart failure, unspecified HF chronicity, unspecified heart failure type [facility])    ED Discharge Orders    None       Bethel Born, PA-C 08/18/18 1555    Jacalyn Lefevre, MD 08/18/18 [phone]

## 2018-08-18 NOTE — H&P (Addendum)
Cardiology Admission History and Physical:   Patient ID: NIKE SOUTHWELL MRN: [id number]; DOB: 1956-08-23   Admission date: 08/18/2018  Primary Care Provider: [name], DO Primary Cardiologist: Olga Millers, MD new Primary Electrophysiologist:  None   Chief Complaint:  Chest pain  Patient Profile:   Colin Keller is a 62 y.o. male with PMH of DM, GERD, HLD, HTN and tobacco use now presenting with chest pain.  History of Present Illness:   Mr. Litt had been in usual state of health with above issues until 2 days ago and was more SOB than usual.  At night more noticeable and he would have pain in upper chest more on right.  He would sit up and was some better.  He had lower ext edema and stopped his amlodipine.  And edema improved.   SOB at night continues.    He has IDDM, GERD and has smoked for 50 years.  He has decided to stop now.     EKG  SR with LVH and sT depression.   Cr 1.39, BNP 1083, Troponin 0.38 and 0.29  + tobacco   No pain now.  +FH of CAD.      Past Medical History:  Diagnosis Date  . Diabetes mellitus   . GERD (gastroesophageal reflux disease)   . Hyperlipidemia   . Hypertension   . Tobacco abuse     History reviewed. No pertinent surgical history.   Medications Prior to Admission: Prior to Admission medications   Medication Sig Start Date End Date Taking? Authorizing Provider  amLODipine (NORVASC) 10 MG tablet Take 1 tablet (10 mg total) by mouth daily. 07/26/18  Yes Hensel, Santiago Bumpers, MD  aspirin 81 MG tablet Take 81 mg by mouth daily.   Yes [provider]  HYDROcodone-acetaminophen (NORCO) 10-325 MG tablet Take 1 tablet by mouth every 8 (eight) hours as needed. 08/03/18  Yes Jeneen Rinks J, DO  insulin aspart (NOVOLOG) 100 UNIT/ML injection Inject 3-7 Units into the skin 2 (two) times daily with a meal. 4-5 units BKFST, 4-7 units with dinner 07/26/18  Yes Hensel, Santiago Bumpers, MD  insulin degludec (TRESIBA FLEXTOUCH) 100 UNIT/ML SOPN FlexTouch  Pen Inject 0.12 mLs (12 Units total) into the skin every morning. 07/26/18  Yes Hensel, Santiago Bumpers, MD  lisinopril (PRINIVIL,ZESTRIL) 20 MG tablet Take 1 tablet (20 mg total) by mouth daily. 07/06/18  Yes Hensel, Santiago Bumpers, MD  Menthol, Topical Analgesic, (BIOFREEZE ROLL-ON) 4 % GEL Apply 1 application topically as needed (shoulder pain).   Yes [provider]  metFORMIN (GLUCOPHAGE) 1000 MG tablet Take 1 tablet (1,000 mg total) by mouth 2 (two) times daily. 01/31/18  Yes Jeneen Rinks J, DO  pravastatin (PRAVACHOL) 20 MG tablet Take 1 tablet (20 mg total) by mouth 3 (three) times a week. 03/30/18  Yes Hensel, Santiago Bumpers, MD  ranitidine (ZANTAC) 75 MG tablet Take 150 mg by mouth daily as needed.    Yes [provider]  tamsulosin (FLOMAX) 0.4 MG CAPS capsule Take 1 capsule (0.4 mg total) by mouth daily. 07/26/18  Yes Hensel, Santiago Bumpers, MD   RECENTLY STOPPED AMLODIPINE AND FLOMAX.   Allergies:    Allergies  Allergen Reactions  . Amitriptyline Other (See Comments)    Urinary retention  . Carvedilol Diarrhea and Other (See Comments)    GI discomfort and hip pain  . Cyclobenzaprine Other (See Comments)    Made patient "restleness, twitchy"   . Nortriptyline Hcl Other (See Comments)  Vivid / bad dreams  . Simvastatin Other (See Comments)    Arthralgias    Social History:   Social History   Socioeconomic History  . Marital status: Married    Spouse name: Not on file  . Number of children: Not on file  . Years of education: Not on file  . Highest education level: Not on file  Occupational History  . Not on file  Social Needs  . Financial resource strain: Not on file  . Food insecurity:    Worry: Not on file    Inability: Not on file  . Transportation needs:    Medical: Not on file    Non-medical: Not on file  Tobacco Use  . Smoking status: Current Every Day Smoker    Packs/day: 0.50    Years: 45.00    Pack years: 22.50    Types: Cigarettes    Start date: 08/12/1970    . Smokeless tobacco: Never Used  . Tobacco comment: Quit for 4.5 months in August 2016  Substance and Sexual Activity  . Alcohol use: No    Alcohol/week: 0.0 standard drinks  . Drug use: No  . Sexual activity: Yes  Lifestyle  . Physical activity:    Days per week: Not on file    Minutes per session: Not on file  . Stress: Not on file  Relationships  . Social connections:    Talks on phone: Not on file    Gets together: Not on file    Attends religious service: Not on file    Active member of club or organization: Not on file    Attends meetings of clubs or organizations: Not on file    Relationship status: Not on file  . Intimate partner violence:    Fear of current or ex partner: Not on file    Emotionally abused: Not on file    Physically abused: Not on file    Forced sexual activity: Not on file  Other Topics Concern  . 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 He and his wife were both laid off during recession. Wife Lawson Fiscal is also [facility] patient. Grown children. 1ppd smoker. Previously used marijuana -quit 10/03          Family History:   The patient's family history includes Diabetes in his mother; Heart disease in his father and mother.    ROS:  Please see the history of present illness.  General:no colds or fevers, no weight changes Skin:no rashes or ulcers HEENT:no blurred vision, no congestion CV:see HPI PUL:see HPI GI:no diarrhea constipation or melena, no indigestion GU:no hematuria, no dysuria, has stopped flomax MS:no joint pain, no claudication Neuro:no syncope, no lightheadedness Endo:+ diabetes, no thyroid disease  All other ROS reviewed and negative.     Physical Exam/Data:   Vitals:   08/18/18 1354 08/18/18 1434 08/18/18 1445 08/18/18 1500  BP: (!) 154/69  (!) 155/84 (!) 151/81  Pulse: 82  86 83  Resp: 18  12 14   Temp: 98.5 F (36.9 C)     TempSrc: Oral     SpO2: 100%  100% 100%  Weight:  71.7 kg     No  intake or output data in the 24 hours ending 08/18/18 1546 Filed Weights   08/18/18 1434  Weight: 71.7 kg   Body mass index is 22.67 kg/m.  General:  Well nourished, well developed, in no acute distress currently HEENT: normal Lymph: no adenopathy Neck: +  JVD Endocrine:  No thryomegaly Vascular: No carotid bruits; pedal pulses 2+ bilaterally   Cardiac:  normal S1, S2; RRR; no murmur gallup rub or click Lungs:  Breath sounds to auscultation bilaterally, no wheezing, rhonchi + rales  Abd: soft, nontender, no hepatomegaly  Ext: tr lower ext edema Musculoskeletal:  No deformities, BUE and BLE strength normal and equal Skin: warm and dry  Neuro:  Alert and oriented X 3 MAE follows commands, no focal abnormalities noted Psych:  Normal affect     Relevant CV Studies: none  Laboratory Data:  Chemistry Recent Labs  Lab 08/18/18 1401  NA 136  K 4.8  CL 103  CO2 22  GLUCOSE 376*  BUN 25*  CREATININE 1.39*  CALCIUM 9.3  GFRNONAA 52*  GFRAA >60  ANIONGAP 11    No results for input(s): PROT, ALBUMIN, AST, ALT, ALKPHOS, BILITOT in the last 168 hours. Hematology Recent Labs  Lab 08/18/18 1401  WBC 7.9  RBC 3.11*  HGB 9.9*  HCT 31.1*  MCV 100.0  MCH 31.8  MCHC 31.8  RDW 13.4  PLT 315   Cardiac Enzymes Recent Labs  Lab 08/18/18 1401  TROPONINI 0.38*    Recent Labs  Lab 08/18/18 1409  TROPIPOC 0.29*    BNP Recent Labs  Lab 08/18/18 1401  BNP 1,083.3*    DDimer No results for input(s): DDIMER in the last 168 hours.  Radiology/Studies:  No results found.  Assessment and Plan:   1. Acute CHF with elevated BNP and pl effusion.  Will add lasix , check Echo.  Dr. Jens Som to see.  Admit to tele.  2.    Elevated troponin no acute chest pain, only when lies flat with SOB.  May be due to HF alone, serial troponins and IV heparin - other plan when echo back.  Possible cardiac CTA vs nuc, depending on echo.   3.    HTN elevated but stopped amlodipine.  4.     HLD continue lipitor check lipids   5.    IDDM cover with SSI , hold metformin   6.    Tobacco use he states he is stopping.     Severity of Illness: The appropriate patient status for this patient is INPATIENT. Inpatient status is judged to be reasonable and necessary in order to provide the required intensity of service to ensure the patient's safety. The patient's presenting symptoms, physical exam findings, and initial radiographic and laboratory data in the context of their chronic comorbidities is felt to place them at high risk for further clinical deterioration. Furthermore, it is not anticipated that the patient will be medically stable for discharge from the hospital within 2 midnights of admission. The following factors support the patient status of inpatient.   " The patient's presenting symptoms include chest pain and SOB. " The worrisome physical exam findings include rales, + JVD. " The initial radiographic and laboratory data are worrisome because of pleural effusions, elevated troponins. " The chronic co-morbidities include IDDM, Tobacco use, FH CAD, HTN, HLD..   * I certify that at the point of admission it is my clinical judgment that the patient will require inpatient hospital care spanning beyond 2 midnights from the point of admission due to high intensity of service, high risk for further deterioration and high frequency of surveillance required.*    For questions or updates, please contact [facility] Please consult [email or web address] for contact info under        Signed, Nada Boozer, NP  08/18/2018 3:46 PM  As above, patient seen and examined.  Briefly he is a 61 year old male with past medical history of diabetes mellitus, hypertension, hyperlipidemia, tobacco abuse with probable non-ST elevation myocardial infarction and acute congestive heart failure.  Patient states he had upper chest and bilateral arm pain 2 weeks ago.  He thought he had pulled a muscle.  His  pain persisted through the night.  Since that time he has had mild increased dyspnea on exertion but notes orthopnea and minimal pedal edema.  He also has had some recurrent chest pain when lying flat.  His dyspnea worsened and he presented for further evaluation. Chest x-ray shows hyperinflation and pleural effusions. Cr 1.39.  BNP 1083.  Troponin 0 0.38.  Hemoglobin 9.9.  Electrocardiogram shows sinus rhythm, cannot rule out prior inferior infarct, nonspecific ST changes.  1 new onset congestive heart failure-I am concerned that patient may have had a myocardial infarction 2 weeks ago at time of upper chest and bilateral arm pain.  He now has new onset congestive heart failure.  We will check echocardiogram for LV function.  I will give Lasix 40 mg daily.  Follow renal function closely.  I will stop ACE inhibitor for now as he will require cardiac catheterization on Monday and has mild baseline renal insufficiency.  Follow I's and O's and daily weights.  2 possible recent myocardial infarction-troponin is mildly elevated.  As outlined above I am concerned about the possibility of myocardial infarction 2 weeks ago.  We will treat with aspirin, heparin, Coreg 3.125 mg twice daily and continue pravastatin (he did not tolerate high-dose statins previously).  Continue to cycle enzymes.  Echocardiogram to quantify LV function.  Plan cardiac catheterization on Monday.  The risks and benefits including myocardial infarction, CVA and death discussed and he agrees to proceed.  3 chronic stage III kidney disease-follow renal function closely with diuresis.  Hold ACE inhibitor prior to catheterization.  Limit dye.  No ventriculogram.  4 tobacco abuse-patient counseled on discontinuing.  5 hyperlipidemia-continue pravastatin.  6 diabetes mellitus-follow CBGs.  Hold Glucophage.  7 hypertension-I am holding ACE inhibitor in anticipation of catheterization.  We are adding Lasix, carvedilol and will continue  amlodipine 5 mg daily.  Medication adjustments after LV function known.  8 left carotid bruit-he will need follow-up carotid Dopplers after discharge.  9 anemia-question contribution from renal insufficiency.  MCV is elevated.  Check B12 and folate.  Olga Millers, MD

## 2018-08-18 NOTE — ED Notes (Signed)
Carb modified dinner tray ordered 

## 2018-08-18 NOTE — Progress Notes (Signed)
ANTICOAGULATION CONSULT NOTE - Initial Consult  Pharmacy Consult:  Heparin Indication: chest pain/ACS  Allergies  Allergen Reactions  . Amitriptyline Other (See Comments)    Urinary retention  . Carvedilol Diarrhea and Other (See Comments)    GI discomfort and hip pain  . Cyclobenzaprine Other (See Comments)    Made patient "restleness, twitchy"   . Nortriptyline Hcl Other (See Comments)    Vivid / bad dreams  . Simvastatin Other (See Comments)    Arthralgias    Patient Measurements: Weight: 158 lb (71.7 kg) Heparin Dosing Weight: 71 kg  Vital Signs: Temp: 98.5 F (36.9 C) (09/07 1354) Temp Source: Oral (09/07 1354) BP: 154/69 (09/07 1354) Pulse Rate: 82 (09/07 1354)  Labs: Recent Labs    08/18/18 1401  HGB 9.9*  HCT 31.1*  PLT 315  CREATININE 1.39*    Estimated Creatinine Clearance: 54.4 mL/min (A) (by C-G formula based on SCr of 1.39 mg/dL (H)).   Medical History: Past Medical History:  Diagnosis Date  . Diabetes mellitus   . GERD (gastroesophageal reflux disease)   . Hyperlipidemia   . Hypertension   . Tobacco abuse      Assessment: 28 YOM presented with chest pain and SOB.  Troponin elevated and Pharmacy consulted to begin IV heparin for ACS.  Baseline labs and home meds reviewed.   Goal of Therapy:  Heparin level 0.3-0.7 units/ml Monitor platelets by anticoagulation protocol: Yes    Plan:  Heparin 3500 units IV bolus, then Heparin gtt at 900 units/hr Check 6 hr heparin level Daily heparin level and CBC   Jed Kutch D. Laney Potash, PharmD, BCPS, BCCCP 08/18/2018, 2:51 PM

## 2018-08-18 NOTE — Telephone Encounter (Signed)
   Reason for call:   I received a call from [name] at 1:15 PM indicating he is having chest pain and breathing issues and they are on their way to the hospital.   Pertinent Data:   He is not currently having chest pain. Chest pain began two weeks ago and has been ongoing. Its worse at night and associated with orthopnea and PND which is also worse at night. Chest pain is in the "upper part" of his chest. It feels like someone is sitting on his chest. The chest pain relieves when he sits upright during the day and when he sleeps on his side instead of his back. He does have very mild chest pain during the day, he is very active during the day (describes doing a lot of house work and painting) and the chest pain does not worsen with exertion. The chest pain is associated with nausea, but he wonders if this is a side effect of his medications. The chest pain is non radiating, not associated with diaphoresis or palpitations. The chest pain does not wake him up but shortness of breath does.  PHx of GERD he is on ranitidine which does not help to control his symptoms very well. He does have a history of daily tobacco use but doesn't describe history of COPD. No heart failure evident on prior echo 2003, no history of MI. No prior EKG available for review.   New medications include amlodipine, flomax and insulin degludec   He also notes that his right leg was swollen from the knee down, this improved when he stopped amlodipine    Assessment / Plan / Recommendations:   Agreed with proceeding to the ED for evaluation of this atypical chest pain for rule out of cardiac and pulmonary etiology  As always, pt is advised that if symptoms worsen or new symptoms arise, they should go to an urgent care facility or to to ER for further evaluation.   Colin Pont, MD   08/18/2018, 1:16 PM

## 2018-08-18 NOTE — ED Triage Notes (Signed)
Pt presents for evaluation of chest pain and SOB starting 2 weeks ago. Pain worsened around 330 this morning. Patient reports is a current every day smoker, some dry cough reported. Pain to upper chest.

## 2018-08-18 NOTE — ED Provider Notes (Signed)
Patient care assumed from [facility], [place].  Patient presents with chest pain, orthopnea, PND, elevated troponins, and elevated BNP. CHF exacerbation vs NSTEMI.  Cardiology has been consulted.  Pending their evaluation.  Cardiology will admit the patient.  Patient did not require any further treatments while under my care.  Patient care supervised by Dr. Hyacinth Meeker.  Nash Dimmer, MD    Nash Dimmer, MD 08/19/18 [facility]    Eber Hong, MD 08/20/18 [phone]

## 2018-08-19 ENCOUNTER — Encounter (HOSPITAL_COMMUNITY): Payer: Self-pay | Admitting: *Deleted

## 2018-08-19 ENCOUNTER — Inpatient Hospital Stay (HOSPITAL_COMMUNITY): Payer: Self-pay

## 2018-08-19 ENCOUNTER — Other Ambulatory Visit: Payer: Self-pay

## 2018-08-19 DIAGNOSIS — I5030 Unspecified diastolic (congestive) heart failure: Secondary | ICD-10-CM

## 2018-08-19 LAB — BASIC METABOLIC PANEL
ANION GAP: 16 — AB (ref 5–15)
BUN: 26 mg/dL — ABNORMAL HIGH (ref 8–23)
CALCIUM: 9.6 mg/dL (ref 8.9–10.3)
CO2: 22 mmol/L (ref 22–32)
Chloride: 99 mmol/L (ref 98–111)
Creatinine, Ser: 1.42 mg/dL — ABNORMAL HIGH (ref 0.61–1.24)
GFR calc Af Amer: 59 mL/min — ABNORMAL LOW (ref >60)
GFR calc non Af Amer: 51 mL/min — ABNORMAL LOW (ref >60)
Glucose, Bld: 358 mg/dL — ABNORMAL HIGH (ref 70–99)
Potassium: 4.6 mmol/L (ref 3.5–5.1)
Sodium: 137 mmol/L (ref 135–145)

## 2018-08-19 LAB — ECHOCARDIOGRAM COMPLETE
Height: 71 in
Weight: 2361.6 oz

## 2018-08-19 LAB — CBC
HEMATOCRIT: 34.3 % — AB (ref 39.0–52.0)
Hemoglobin: 11.1 g/dL — ABNORMAL LOW (ref 13.0–17.0)
MCH: 31.4 pg (ref 26.0–34.0)
MCHC: 32.4 g/dL (ref 30.0–36.0)
MCV: 96.9 fL (ref 78.0–100.0)
PLATELETS: 345 10*3/uL (ref 150–400)
RBC: 3.54 MIL/uL — ABNORMAL LOW (ref 4.22–5.81)
RDW: 12.7 % (ref 11.5–15.5)
WBC: 8.9 10*3/uL (ref 4.0–10.5)

## 2018-08-19 LAB — HEPARIN LEVEL (UNFRACTIONATED)
HEPARIN UNFRACTIONATED: 0.17 [IU]/mL — AB (ref 0.30–0.70)
HEPARIN UNFRACTIONATED: 0.5 [IU]/mL (ref 0.30–0.70)
Heparin Unfractionated: 0.37 IU/mL (ref 0.30–0.70)

## 2018-08-19 LAB — GLUCOSE, CAPILLARY
GLUCOSE-CAPILLARY: 378 mg/dL — AB (ref 70–99)
GLUCOSE-CAPILLARY: 352 mg/dL — AB (ref 70–99)
GLUCOSE-CAPILLARY: 478 mg/dL — AB (ref 70–99)
Glucose-Capillary: 370 mg/dL — ABNORMAL HIGH (ref 70–99)

## 2018-08-19 LAB — LIPID PANEL
Cholesterol: 198 mg/dL (ref 0–200)
HDL: 67 mg/dL (ref >40)
LDL Cholesterol: 117 mg/dL — ABNORMAL HIGH (ref 0–99)
Total CHOL/HDL Ratio: 3 RATIO
Triglycerides: 71 mg/dL (ref <150)
VLDL: 14 mg/dL (ref 0–40)

## 2018-08-19 LAB — TROPONIN I
TROPONIN I: 0.44 ng/mL — AB (ref <0.03)
TROPONIN I: 0.68 ng/mL — AB (ref <0.03)

## 2018-08-19 MED ORDER — HEPARIN BOLUS VIA INFUSION
2000.0000 [IU] | Freq: Once | INTRAVENOUS | Status: AC
  Administered 2018-08-19: 2000 [IU] via INTRAVENOUS
  Filled 2018-08-19: qty 2000

## 2018-08-19 NOTE — Progress Notes (Signed)
Pt's wife is curious about Echo cardiogram, called Echo through hospital operator as initially their phone did go to [facility], [place]

## 2018-08-19 NOTE — Progress Notes (Signed)
Consent has been take for a cardiac cath tomorrow, Family's aware, IV heparin is continue @11 , denies CP and SOB, will continue to monitor  Lonia Farber, RN

## 2018-08-19 NOTE — Progress Notes (Addendum)
ANTICOAGULATION CONSULT NOTE - Initial Consult  Pharmacy Consult:  Heparin Indication: chest pain/ACS  Allergies  Allergen Reactions  . Amitriptyline Other (See Comments)    Urinary retention  . Carvedilol Diarrhea and Other (See Comments)    GI discomfort and hip pain  . Cyclobenzaprine Other (See Comments)    Made patient "restleness, twitchy"   . Nortriptyline Hcl Other (See Comments)    Vivid / bad dreams  . Simvastatin Other (See Comments)    Arthralgias    Patient Measurements: Height: 5\' 11"  (180.3 cm) Weight: 147 lb 9.6 oz (67 kg) IBW/kg (Calculated) : 75.3 Heparin Dosing Weight: 71 kg  Vital Signs: Temp: 98.1 F (36.7 C) (09/08 0550) Temp Source: Oral (09/08 0550) BP: 125/64 (09/08 0550) Pulse Rate: 65 (09/08 0550)  Labs: Recent Labs    08/18/18 1401 08/18/18 1707 08/18/18 2305 08/19/18 0603  HGB 9.9*  --   --  11.1*  HCT 31.1*  --   --  34.3*  PLT 315  --   --  345  HEPARINUNFRC  --   --  0.17* 0.50  CREATININE 1.39*  --   --   --   TROPONINI 0.38* 0.32* 0.44*  --     Estimated Creatinine Clearance: 50.9 mL/min (A) (by C-G formula based on SCr of 1.39 mg/dL (H)).   Medical History: Past Medical History:  Diagnosis Date  . Diabetes mellitus   . GERD (gastroesophageal reflux disease)   . Hyperlipidemia   . Hypertension   . Tobacco abuse      Assessment: 31 YOM presented with chest pain and SOB.  Troponin elevated and Pharmacy consulted to begin IV heparin for ACS.  Baseline labs and home meds reviewed. Heparin level therapeutic this AM at 0.50. Will continue patient on current infusion and recheck heparin level this afternoon to ensure its still therapeutic. CBC stable at this time.   Addendum: 9/8 1353: Patient's 2nd heparin level came back @ 1251 therapeutic at 0.37. No changes at this time to patient's infusion rate.   Goal of Therapy:  Heparin level 0.3-0.7 units/ml Monitor platelets by anticoagulation protocol: Yes   Plan:  Continue  heparin 1100 units/hr Daily heparin level and CBC  Lenord Carbo, PharmD PGY1 Pharmacy Resident Phone: [phone] 08/19/2018, 7:27 AM

## 2018-08-19 NOTE — Progress Notes (Signed)
Progress Note  Patient Name: ZOE NORDIN Date of Encounter: 08/19/2018  Primary Cardiologist: Olga Millers, MD   Subjective   No chest pain or dyspnea  Inpatient Medications    Scheduled Meds: . amLODipine  5 mg Oral Daily  . aspirin EC  81 mg Oral Daily  . carvedilol  3.125 mg Oral BID WC  . famotidine  20 mg Oral Daily  . insulin aspart  0-5 Units Subcutaneous QHS  . insulin aspart  0-9 Units Subcutaneous TID WC  . nitroGLYCERIN  0.5 inch Topical Q6H  . [START ON 08/20/2018] pravastatin  20 mg Oral Once per day on Mon Wed Fri   Continuous Infusions: . sodium chloride 10 mL/hr at 08/19/18 0624  . heparin 1,100 Units/hr (08/19/18 0110)   PRN Meds: acetaminophen, ALPRAZolam, HYDROcodone-acetaminophen, nitroGLYCERIN, ondansetron (ZOFRAN) IV   Vital Signs    Vitals:   08/18/18 1900 08/18/18 2003 08/19/18 0015 08/19/18 0550  BP: 140/84 (!) 146/79 127/63 125/64  Pulse: 80 79 80 65  Resp: 19   18  Temp:  98.6 F (37 C) 98.2 F (36.8 C) 98.1 F (36.7 C)  TempSrc:  Oral Oral Oral  SpO2: 96% 100% 97% 98%  Weight:   68.4 kg 67 kg  Height:    5\' 11"  (1.803 m)    Intake/Output Summary (Last 24 hours) at 08/19/2018 0842 Last data filed at 08/19/2018 0634 Gross per 24 hour  Intake 175.5 ml  Output 2300 ml  Net -2124.5 ml   Filed Weights   08/18/18 1434 08/19/18 0015 08/19/18 0550  Weight: 71.7 kg 68.4 kg 67 kg    Telemetry    Sinus - Personally Reviewed   Physical Exam   GEN: No acute distress.   Neck: No JVD Cardiac: RRR, no murmurs, rubs, or gallops.  Respiratory: Clear to auscultation bilaterally. GI: Soft, nontender, non-distended  MS: No edema Neuro:  Nonfocal  Psych: Normal affect   Labs    Chemistry Recent Labs  Lab 08/18/18 1401 08/18/18 1707 08/19/18 0603  NA 136  --  137  K 4.8  --  4.6  CL 103  --  99  CO2 22  --  22  GLUCOSE 376*  --  358*  BUN 25*  --  26*  CREATININE 1.39*  --  1.42*  CALCIUM 9.3  --  9.6  PROT  --  6.5  --     ALBUMIN  --  3.5  --   AST  --  21  --   ALT  --  22  --   ALKPHOS  --  75  --   BILITOT  --  1.0  --   GFRNONAA 52*  --  51*  GFRAA >60  --  59*  ANIONGAP 11  --  16*     Hematology Recent Labs  Lab 08/18/18 1401 08/19/18 0603  WBC 7.9 8.9  RBC 3.11* 3.54*  HGB 9.9* 11.1*  HCT 31.1* 34.3*  MCV 100.0 96.9  MCH 31.8 31.4  MCHC 31.8 32.4  RDW 13.4 12.7  PLT 315 345    Cardiac Enzymes Recent Labs  Lab 08/18/18 1401 08/18/18 1707 08/18/18 2305 08/19/18 0603  TROPONINI 0.38* 0.32* 0.44* 0.68*    Recent Labs  Lab 08/18/18 1409  TROPIPOC 0.29*     BNP Recent Labs  Lab 08/18/18 1401  BNP 1,083.3*     Radiology    Dg Chest 2 View  Result Date: 08/18/2018 CLINICAL DATA:  Chest  pain and shortness of breath for 2 weeks. Nonproductive cough. Smoker. EXAM: CHEST - 2 VIEW COMPARISON:  None. FINDINGS: The cardiomediastinal silhouette is within normal limits. Aortic atherosclerosis is noted. The lungs are mildly hyperinflated with peribronchial thickening and mild diffuse interstitial prominence. There are small bilateral pleural effusions. No overt pulmonary edema, focal airspace consolidation, or pneumothorax is identified. No acute osseous abnormality is seen. IMPRESSION: Hyperinflation with bronchitic changes and small pleural effusions. Electronically Signed   By: Sebastian Ache M.D.   On: 08/18/2018 15:54    Patient Profile     62 year old male with past medical history of diabetes mellitus, hypertension, hyperlipidemia, tobacco abuse with probable non-ST elevation myocardial infarction and acute congestive heart failure.    Assessment & Plan    1 new onset congestive heart failure-based on patient's history I am concerned about the possibility of a prior myocardial infarction 2 weeks prior to admission.  He received 1 dose of Lasix in the emergency room and feels much better today.  I will not diurese further as he will require cardiac catheterization tomorrow and  has baseline stage III kidney disease.  However he may require low-dose diuretic at discharge.  We are waiting results of echocardiogram.  I have held his ACE inhibitor in anticipation of catheterization.  Would resume following procedure once renal function stable.   2 possible recent myocardial infarction-troponin is mildly elevated.  I am concerned about possible myocardial infarction 2 weeks ago.  Echocardiogram to assess wall motion and LV function.  Continue aspirin, heparin and carvedilol.  Continue low-dose pravastatin (patient did not tolerate high-dose statins in the past).  Cardiac catheterization tomorrow.   3 chronic stage III kidney disease-I have held patient's ACE inhibitor and further diuresis.  We will not hydrate prior to catheterization given CHF at time of admission.  Limit dye.  No ventriculogram.  Follow renal function closely after procedure.  Patient understands risk of contrast nephropathy.    4 tobacco abuse-patient counseled on discontinuing.  5 hyperlipidemia-continue pravastatin; he did not tolerate high-dose statins in the past.  Will likely follow-up in lipid clinic for consideration of Repatha.  6 diabetes mellitus-Glucophage on hold.  Hemoglobin A1c elevated.  Would arrange endocrinology evaluation following discharge.  7 hypertension-I am holding ACE inhibitor in anticipation of catheterization.    Continue amlodipine and carvedilol for now.  If LV function reduced would discontinue amlodipine and resume ACE inhibitor once renal function stable.  8 left carotid bruit-he will need follow-up carotid Dopplers after discharge.  9 anemia-question contribution from renal insufficiency.  MCV is elevated.    Await results of B12 and folate.  For questions or updates, please contact [facility] Please consult [email or web address] for contact info under        Signed, Olga Millers, MD  08/19/2018, 8:42 AM

## 2018-08-19 NOTE — Progress Notes (Signed)
ANTICOAGULATION CONSULT NOTE - Follow Up Consult  Pharmacy Consult for heparin Indication: chest pain/ACS  Labs: Recent Labs    08/18/18 1401 08/18/18 1707 08/18/18 2305  HGB 9.9*  --   --   HCT 31.1*  --   --   PLT 315  --   --   HEPARINUNFRC  --   --  0.17*  CREATININE 1.39*  --   --   TROPONINI 0.38* 0.32*  --     Assessment: 62yo male subtherapeutic on heparin with initial dosing for ACS.  Goal of Therapy:  Heparin level 0.3-0.7 units/ml   Plan:  Will rebolus with heparin 2000 units and increase heparin gtt by 3 units/kg/hr to 1100 units/hr and check level in 6 hours.    Vernard Gambles, PharmD, BCPS  08/19/2018,12:52 AM

## 2018-08-19 NOTE — Progress Notes (Signed)
Troponin trending up , MD notified. Will continue to monitor.

## 2018-08-19 NOTE — Progress Notes (Signed)
  Echocardiogram 2D Echocardiogram has been performed.  Colin Keller 08/19/2018, 2:23 PM

## 2018-08-20 ENCOUNTER — Encounter (HOSPITAL_COMMUNITY): Payer: Self-pay | Admitting: Cardiovascular Disease

## 2018-08-20 ENCOUNTER — Encounter (HOSPITAL_COMMUNITY)
Admission: EM | Disposition: A | Discharge status: Home / Self Care | Payer: Self-pay | Source: Home / Self Care | Attending: Thoracic Surgery (Cardiothoracic Vascular Surgery)

## 2018-08-20 DIAGNOSIS — I5031 Acute diastolic (congestive) heart failure: Secondary | ICD-10-CM

## 2018-08-20 DIAGNOSIS — F17200 Nicotine dependence, unspecified, uncomplicated: Secondary | ICD-10-CM

## 2018-08-20 DIAGNOSIS — I214 Non-ST elevation (NSTEMI) myocardial infarction: Secondary | ICD-10-CM | POA: Diagnosis present

## 2018-08-20 DIAGNOSIS — E1165 Type 2 diabetes mellitus with hyperglycemia: Secondary | ICD-10-CM

## 2018-08-20 HISTORY — PX: LEFT HEART CATH AND CORONARY ANGIOGRAPHY: CATH118249

## 2018-08-20 HISTORY — PX: INTRAVASCULAR PRESSURE WIRE/FFR STUDY: CATH118243

## 2018-08-20 HISTORY — PX: CORONARY PRESSURE/FFR STUDY: CATH118243

## 2018-08-20 LAB — BASIC METABOLIC PANEL
Anion gap: 11 (ref 5–15)
Anion gap: 17 — ABNORMAL HIGH (ref 5–15)
Anion gap: 13 (ref 5–15)
BUN: 29 mg/dL — ABNORMAL HIGH (ref 8–23)
BUN: 29 mg/dL — AB (ref 8–23)
BUN: 28 mg/dL — AB (ref 8–23)
CALCIUM: 8.9 mg/dL (ref 8.9–10.3)
CHLORIDE: 104 mmol/L (ref 98–111)
CO2: 22 mmol/L (ref 22–32)
CO2: 17 mmol/L — ABNORMAL LOW (ref 22–32)
CO2: 22 mmol/L (ref 22–32)
CREATININE: 1.43 mg/dL — AB (ref 0.61–1.24)
CREATININE: 1.38 mg/dL — AB (ref 0.61–1.24)
Calcium: 8.8 mg/dL — ABNORMAL LOW (ref 8.9–10.3)
Calcium: 9.3 mg/dL (ref 8.9–10.3)
Chloride: 102 mmol/L (ref 98–111)
Chloride: 101 mmol/L (ref 98–111)
Creatinine, Ser: 1.45 mg/dL — ABNORMAL HIGH (ref 0.61–1.24)
GFR calc Af Amer: 58 mL/min — ABNORMAL LOW (ref >60)
GFR calc Af Amer: 57 mL/min — ABNORMAL LOW (ref >60)
GFR calc non Af Amer: 50 mL/min — ABNORMAL LOW (ref >60)
GFR, EST NON AFRICAN AMERICAN: 49 mL/min — AB (ref >60)
GFR, EST NON AFRICAN AMERICAN: 53 mL/min — AB (ref >60)
GLUCOSE: 474 mg/dL — AB (ref 70–99)
Glucose, Bld: 439 mg/dL — ABNORMAL HIGH (ref 70–99)
Glucose, Bld: 293 mg/dL — ABNORMAL HIGH (ref 70–99)
POTASSIUM: 5.1 mmol/L (ref 3.5–5.1)
Potassium: 4.5 mmol/L (ref 3.5–5.1)
Potassium: 4 mmol/L (ref 3.5–5.1)
SODIUM: 135 mmol/L (ref 135–145)
SODIUM: 139 mmol/L (ref 135–145)
Sodium: 135 mmol/L (ref 135–145)

## 2018-08-20 LAB — CBC
HCT: 32.1 % — ABNORMAL LOW (ref 39.0–52.0)
HCT: 32.3 % — ABNORMAL LOW (ref 39.0–52.0)
Hemoglobin: 10.4 g/dL — ABNORMAL LOW (ref 13.0–17.0)
Hemoglobin: 10.6 g/dL — ABNORMAL LOW (ref 13.0–17.0)
MCH: 31.7 pg (ref 26.0–34.0)
MCH: 31.9 pg (ref 26.0–34.0)
MCHC: 32.4 g/dL (ref 30.0–36.0)
MCHC: 32.8 g/dL (ref 30.0–36.0)
MCV: 97.9 fL (ref 78.0–100.0)
MCV: 97.3 fL (ref 78.0–100.0)
PLATELETS: 312 10*3/uL (ref 150–400)
Platelets: 253 10*3/uL (ref 150–400)
RBC: 3.28 MIL/uL — ABNORMAL LOW (ref 4.22–5.81)
RBC: 3.32 MIL/uL — ABNORMAL LOW (ref 4.22–5.81)
RDW: 12.8 % (ref 11.5–15.5)
RDW: 13 % (ref 11.5–15.5)
WBC: 8.9 10*3/uL (ref 4.0–10.5)
WBC: 8.7 10*3/uL (ref 4.0–10.5)

## 2018-08-20 LAB — HEPARIN LEVEL (UNFRACTIONATED): HEPARIN UNFRACTIONATED: 0.35 [IU]/mL (ref 0.30–0.70)

## 2018-08-20 LAB — GLUCOSE, CAPILLARY
GLUCOSE-CAPILLARY: 315 mg/dL — AB (ref 70–99)
GLUCOSE-CAPILLARY: 219 mg/dL — AB (ref 70–99)
Glucose-Capillary: 456 mg/dL — ABNORMAL HIGH (ref 70–99)
Glucose-Capillary: 403 mg/dL — ABNORMAL HIGH (ref 70–99)
Glucose-Capillary: 387 mg/dL — ABNORMAL HIGH (ref 70–99)

## 2018-08-20 LAB — HIV ANTIBODY (ROUTINE TESTING W REFLEX): HIV Screen 4th Generation wRfx: NONREACTIVE

## 2018-08-20 LAB — POCT ACTIVATED CLOTTING TIME
ACTIVATED CLOTTING TIME: 197 s
ACTIVATED CLOTTING TIME: 219 s

## 2018-08-20 LAB — VITAMIN B12: VITAMIN B 12: 195 pg/mL (ref 180–914)

## 2018-08-20 SURGERY — LEFT HEART CATH AND CORONARY ANGIOGRAPHY
Anesthesia: LOCAL

## 2018-08-20 MED ORDER — INSULIN GLARGINE 100 UNIT/ML ~~LOC~~ SOLN
12.0000 [IU] | Freq: Every day | SUBCUTANEOUS | Status: DC
  Administered 2018-08-20 – 2018-08-22 (×3): 12 [IU] via SUBCUTANEOUS
  Filled 2018-08-20 (×4): qty 0.12

## 2018-08-20 MED ORDER — ASPIRIN 81 MG PO CHEW: 81.0000 mg | CHEWABLE_TABLET | ORAL | Status: AC

## 2018-08-20 MED ORDER — HEPARIN (PORCINE) IN NACL 100-0.45 UNIT/ML-% IJ SOLN
1150.0000 [IU]/h | INTRAMUSCULAR | Status: AC
  Administered 2018-08-21 – 2018-08-22 (×2): 1100 [IU]/h via INTRAVENOUS
  Administered 2018-08-22: 1150 [IU]/h via INTRAVENOUS
  Filled 2018-08-20 (×3): qty 250

## 2018-08-20 MED ORDER — FENTANYL CITRATE (PF) 100 MCG/2ML IJ SOLN
INTRAMUSCULAR | Status: DC | PRN
  Administered 2018-08-20: 25 ug via INTRAVENOUS

## 2018-08-20 MED ORDER — FENTANYL CITRATE (PF) 100 MCG/2ML IJ SOLN
INTRAMUSCULAR | Status: AC
  Filled 2018-08-20: qty 2

## 2018-08-20 MED ORDER — SODIUM CHLORIDE 0.9 % WEIGHT BASED INFUSION
1.0000 mL/kg/h | INTRAVENOUS | Status: AC
  Administered 2018-08-20: 1 mL/kg/h via INTRAVENOUS

## 2018-08-20 MED ORDER — INSULIN ASPART 100 UNIT/ML ~~LOC~~ SOLN
0.0000 [IU] | SUBCUTANEOUS | Status: DC
  Administered 2018-08-20: 9 [IU] via SUBCUTANEOUS
  Administered 2018-08-20: 3 [IU] via SUBCUTANEOUS
  Administered 2018-08-20: 7 [IU] via SUBCUTANEOUS
  Administered 2018-08-21: 3 [IU] via SUBCUTANEOUS
  Administered 2018-08-21: 7 [IU] via SUBCUTANEOUS
  Administered 2018-08-21: 5 [IU] via SUBCUTANEOUS
  Administered 2018-08-21: 2 [IU] via SUBCUTANEOUS
  Administered 2018-08-21: 1 [IU] via SUBCUTANEOUS
  Administered 2018-08-21: 2 [IU] via SUBCUTANEOUS
  Administered 2018-08-22: 9 [IU] via SUBCUTANEOUS
  Administered 2018-08-22: 7 [IU] via SUBCUTANEOUS
  Administered 2018-08-22: 9 [IU] via SUBCUTANEOUS
  Administered 2018-08-22: 1 [IU] via SUBCUTANEOUS

## 2018-08-20 MED ORDER — SODIUM CHLORIDE 0.9 % IV SOLN: INTRAVENOUS | Status: DC

## 2018-08-20 MED ORDER — LIDOCAINE HCL (PF) 1 % IJ SOLN
INTRAMUSCULAR | Status: AC
  Filled 2018-08-20: qty 30

## 2018-08-20 MED ORDER — ASPIRIN 81 MG PO CHEW: 81.0000 mg | CHEWABLE_TABLET | ORAL | Status: DC

## 2018-08-20 MED ORDER — SODIUM CHLORIDE 0.9% FLUSH
3.0000 mL | Freq: Two times a day (BID) | INTRAVENOUS | Status: DC
  Administered 2018-08-20: 3 mL via INTRAVENOUS

## 2018-08-20 MED ORDER — SODIUM CHLORIDE 0.9 % IV SOLN: 250.0000 mL | INTRAVENOUS | Status: DC | PRN

## 2018-08-20 MED ORDER — NITROGLYCERIN 1 MG/10 ML FOR IR/CATH LAB
INTRA_ARTERIAL | Status: AC
  Filled 2018-08-20: qty 10

## 2018-08-20 MED ORDER — HEPARIN (PORCINE) IN NACL 1000-0.9 UT/500ML-% IV SOLN
INTRAVENOUS | Status: DC | PRN
  Administered 2018-08-20: 500 mL

## 2018-08-20 MED ORDER — ADENOSINE 12 MG/4ML IV SOLN
INTRAVENOUS | Status: AC
  Filled 2018-08-20: qty 16

## 2018-08-20 MED ORDER — IOHEXOL 350 MG/ML SOLN
INTRAVENOUS | Status: DC | PRN
  Administered 2018-08-20: 75 mL via INTRA_ARTERIAL

## 2018-08-20 MED ORDER — ADENOSINE (DIAGNOSTIC) 140MCG/KG/MIN
INTRAVENOUS | Status: DC | PRN
  Administered 2018-08-20: 140 ug/kg/min via INTRAVENOUS

## 2018-08-20 MED ORDER — VERAPAMIL HCL 2.5 MG/ML IV SOLN
INTRAVENOUS | Status: DC | PRN
  Administered 2018-08-20: 8 mL via INTRA_ARTERIAL

## 2018-08-20 MED ORDER — HEPARIN SODIUM (PORCINE) 1000 UNIT/ML IJ SOLN
INTRAMUSCULAR | Status: DC | PRN
  Administered 2018-08-20: 5000 [IU] via INTRAVENOUS
  Administered 2018-08-20: 3000 [IU] via INTRAVENOUS

## 2018-08-20 MED ORDER — MIDAZOLAM HCL 2 MG/2ML IJ SOLN
INTRAMUSCULAR | Status: AC
  Filled 2018-08-20: qty 2

## 2018-08-20 MED ORDER — SODIUM CHLORIDE 0.9 % IV SOLN
INTRAVENOUS | Status: DC
  Administered 2018-08-20: 10:00:00 via INTRAVENOUS

## 2018-08-20 MED ORDER — SODIUM CHLORIDE 0.9% FLUSH: 3.0000 mL | INTRAVENOUS | Status: DC | PRN

## 2018-08-20 MED ORDER — MIDAZOLAM HCL 2 MG/2ML IJ SOLN
INTRAMUSCULAR | Status: DC | PRN
  Administered 2018-08-20: 2 mg via INTRAVENOUS

## 2018-08-20 MED ORDER — HEPARIN SODIUM (PORCINE) 1000 UNIT/ML IJ SOLN
INTRAMUSCULAR | Status: AC
  Filled 2018-08-20: qty 1

## 2018-08-20 MED ORDER — SODIUM CHLORIDE 0.9% FLUSH
3.0000 mL | Freq: Two times a day (BID) | INTRAVENOUS | Status: DC
  Administered 2018-08-21 – 2018-08-22 (×4): 3 mL via INTRAVENOUS

## 2018-08-20 MED ORDER — HEPARIN (PORCINE) IN NACL 1000-0.9 UT/500ML-% IV SOLN
INTRAVENOUS | Status: AC
  Filled 2018-08-20: qty 1000

## 2018-08-20 MED ORDER — NITROGLYCERIN 1 MG/10 ML FOR IR/CATH LAB
INTRA_ARTERIAL | Status: DC | PRN
  Administered 2018-08-20: 150 ug via INTRACORONARY

## 2018-08-20 MED ORDER — LIDOCAINE HCL (PF) 1 % IJ SOLN
INTRAMUSCULAR | Status: DC | PRN
  Administered 2018-08-20: 2 mL

## 2018-08-20 SURGICAL SUPPLY — 15 items
CATH 5FR JL3.5 JR4 ANG PIG MP (CATHETERS) ×1 IMPLANT
CATH LAUNCHER 6FR EBU3.5 (CATHETERS) ×1 IMPLANT
CATH MICROCATH NAVVUS (MICROCATHETER) IMPLANT
DEVICE RAD COMP TR BAND LRG (VASCULAR PRODUCTS) ×1 IMPLANT
GLIDESHEATH SLEND SS 6F .021 (SHEATH) ×1 IMPLANT
GUIDEWIRE INQWIRE 1.5J.035X260 (WIRE) IMPLANT
INQWIRE 1.5J .035X260CM (WIRE) ×2
KIT ESSENTIALS PG (KITS) ×1 IMPLANT
KIT HEART LEFT (KITS) ×2 IMPLANT
MICROCATHETER NAVVUS (MICROCATHETER) ×2
PACK CARDIAC CATHETERIZATION (CUSTOM PROCEDURE TRAY) ×2 IMPLANT
SYR MEDRAD MARK V 150ML (SYRINGE) ×2 IMPLANT
TRANSDUCER W/STOPCOCK (MISCELLANEOUS) ×2 IMPLANT
TUBING CIL FLEX 10 FLL-RA (TUBING) ×2 IMPLANT
WIRE COUGAR XT STRL 190CM (WIRE) ×1 IMPLANT

## 2018-08-20 NOTE — H&P (View-Only) (Signed)
Progress Note  Patient Name: Colin Keller Date of Encounter: 08/20/2018  Primary Cardiologist: Olga Millers, MD   Subjective   Had some brief sharp chest pains overnight but nothing today. Concerned about pending heart cath. Discussed procedure and potential outcomes again, all questions answered.  Inpatient Medications    Scheduled Meds: . amLODipine  5 mg Oral Daily  . aspirin EC  81 mg Oral Daily  . carvedilol  3.125 mg Oral BID WC  . famotidine  20 mg Oral Daily  . insulin aspart  0-5 Units Subcutaneous QHS  . insulin aspart  0-9 Units Subcutaneous TID WC  . nitroGLYCERIN  0.5 inch Topical Q6H  . pravastatin  20 mg Oral Once per day on Mon Wed Fri  . sodium chloride flush  3 mL Intravenous Q12H   Continuous Infusions: . sodium chloride 10 mL/hr at 08/19/18 0624  . sodium chloride    . sodium chloride    . heparin 1,100 Units/hr (08/20/18 0908)   PRN Meds: sodium chloride, acetaminophen, ALPRAZolam, HYDROcodone-acetaminophen, nitroGLYCERIN, ondansetron (ZOFRAN) IV, sodium chloride flush   Vital Signs    Vitals:   08/19/18 1158 08/19/18 1615 08/19/18 1952 08/20/18 0021  BP: 128/71 127/67 126/69 126/66  Pulse: 78 68 70 66  Resp: 18 18  19   Temp: 97.8 F (36.6 C) 98 F (36.7 C) 98.6 F (37 C) 98.3 F (36.8 C)  TempSrc: Oral Oral Oral Oral  SpO2: 98% 99% 98% 97%  Weight:    67.9 kg  Height:        Intake/Output Summary (Last 24 hours) at 08/20/2018 1121 Last data filed at 08/20/2018 0600 Gross per 24 hour  Intake 480 ml  Output 2011 ml  Net -1531 ml   Filed Weights   08/19/18 0015 08/19/18 0550 08/20/18 0021  Weight: 68.4 kg 67 kg 67.9 kg    Telemetry    Sinus - Personally Reviewed  Physical Exam   GEN: No acute distress.   Neck: No JVD. L carotid bruit. Cardiac: RRR, no murmurs, rubs, or gallops. 2+ bilateral radial and DP pulses Respiratory: Clear to auscultation bilaterally. GI: Soft, nontender, non-distended  MS: No edema Neuro:  Nonfocal   Psych: Normal affect   Labs    Chemistry Recent Labs  Lab 08/18/18 1707 08/19/18 0603 08/20/18 0621 08/20/18 0843  NA  --  137 135 135  K  --  4.6 4.5 5.1  CL  --  99 102 101  CO2  --  22 22 17*  GLUCOSE  --  358* 439* 474*  BUN  --  26* 29* 29*  CREATININE  --  1.42* 1.43* 1.45*  CALCIUM  --  9.6 8.9 8.8*  PROT 6.5  --   --   --   ALBUMIN 3.5  --   --   --   AST 21  --   --   --   ALT 22  --   --   --   ALKPHOS 75  --   --   --   BILITOT 1.0  --   --   --   GFRNONAA  --  51* 50* 49*  GFRAA  --  59* 58* 57*  ANIONGAP  --  16* 11 17*     Hematology Recent Labs  Lab 08/19/18 0603 08/20/18 0621 08/20/18 0843  WBC 8.9 8.9 8.7  RBC 3.54* 3.28* 3.32*  HGB 11.1* 10.4* 10.6*  HCT 34.3* 32.1* 32.3*  MCV 96.9 97.9 97.3  MCH 31.4 31.7 31.9  MCHC 32.4 32.4 32.8  RDW 12.7 12.8 13.0  PLT 345 312 253    Cardiac Enzymes Recent Labs  Lab 08/18/18 1401 08/18/18 1707 08/18/18 2305 08/19/18 0603  TROPONINI 0.38* 0.32* 0.44* 0.68*    Recent Labs  Lab 08/18/18 1409  TROPIPOC 0.29*     BNP Recent Labs  Lab 08/18/18 1401  BNP 1,083.3*     Radiology    Dg Chest 2 View  Result Date: 08/18/2018 CLINICAL DATA:  Chest pain and shortness of breath for 2 weeks. Nonproductive cough. Smoker. EXAM: CHEST - 2 VIEW COMPARISON:  None. FINDINGS: The cardiomediastinal silhouette is within normal limits. Aortic atherosclerosis is noted. The lungs are mildly hyperinflated with peribronchial thickening and mild diffuse interstitial prominence. There are small bilateral pleural effusions. No overt pulmonary edema, focal airspace consolidation, or pneumothorax is identified. No acute osseous abnormality is seen. IMPRESSION: Hyperinflation with bronchitic changes and small pleural effusions. Electronically Signed   By: Sebastian Ache M.D.   On: 08/18/2018 15:54   CV studies: Echo 08/19/18 Study Conclusions  - Left ventricle: The cavity size was normal. There was mild   concentric  hypertrophy. Systolic function was normal. The   estimated ejection fraction was in the range of 50% to 55%.   Hypokinesis of the basal-midinferolateral and inferior   myocardium. Doppler parameters are consistent with abnormal left   ventricular relaxation (grade 1 diastolic dysfunction). Doppler   parameters are consistent with indeterminate ventricular filling   pressure. - Aortic valve: Transvalvular velocity was within the normal range.   There was no stenosis. There was no regurgitation. Valve area   (VTI): 2.32 cm^2. Valve area (Vmax): 2.66 cm^2. Valve area   (Vmean): 2.66 cm^2. - Mitral valve: Moderately calcified annulus. Transvalvular   velocity was within the normal range. There was no evidence for   stenosis. There was trivial regurgitation. - Left atrium: The atrium was severely dilated. - Right ventricle: The cavity size was normal. Wall thickness was   normal. Systolic function was normal. - Atrial septum: No defect or patent foramen ovale was identified   by color flow Doppler. - Tricuspid valve: There was no regurgitation. - Pulmonary arteries: Systolic pressure was within the normal   range. PA peak pressure: 9 mm Hg (S).  Patient Profile     62 year old male with past medical history of diabetes mellitus, hypertension, hyperlipidemia, tobacco abuse with  non-ST elevation myocardial infarction and acute diastolic heart failure.    Assessment & Plan    1 new onset diastolic heart failure- -normal LV EF on echo but focal WMA of the basal-midinferolateral and inferior walls. Grade 1 diastolic dysfunction. -appears euvolemic today, received one day of diuresis on admission -holding ACEI in anticipation of cath, plan to restart if renal function stable post cath -on carvedilol  2 NSTEMI -Continue aspirin, heparin and carvedilol.  Continue low-dose pravastatin (patient did not tolerate high-dose statins in the past). -pending cath today. All outcomes discussed,  questions answered  3 chronic stage III kidney disease -holding ACE inhibitor and further diuresis -not hydrated prior to cath given acute diastolic heart failure on admission -Limit dye.  No ventriculogram.  Follow renal function closely after procedure.  Patient understands risk of contrast nephropathy.    4 tobacco abuse -patient counseled on discontinuing.  5 hyperlipidemia -continue pravastatin; he did not tolerate high-dose statins in the past.  Will likely follow-up in lipid clinic for consideration of Repatha.  6 diabetes mellitus -Glucophage  on hold.  Hemoglobin A1c elevated.  Would arrange endocrinology evaluation following discharge.  7 hypertension -holding ACE inhibitor in anticipation of catheterization.     -Continue amlodipine and carvedilol  8 left carotid bruit-he will need follow-up carotid Dopplers after discharge.  9 anemia-question contribution from renal insufficiency.  MCV is elevated.    B12 (normal) and folate (pending).  For questions or updates, please contact [facility] Please consult [email or web address] for contact info under     Signed, Jodelle Red, MD  08/20/2018, 11:21 AM

## 2018-08-20 NOTE — Progress Notes (Signed)
ANTICOAGULATION CONSULT NOTE  Pharmacy Consult:  Heparin Indication: chest pain/ACS  Allergies  Allergen Reactions  . Amitriptyline Other (See Comments)    Urinary retention  . Carvedilol Diarrhea and Other (See Comments)    GI discomfort and hip pain  . Cyclobenzaprine Other (See Comments)    Made patient "restleness, twitchy"   . Nortriptyline Hcl Other (See Comments)    Vivid / bad dreams  . Simvastatin Other (See Comments)    Arthralgias    Patient Measurements: Height: 5\' 11"  (180.3 cm) Weight: 149 lb 11.2 oz (67.9 kg)(Scale C) IBW/kg (Calculated) : 75.3 Heparin Dosing Weight: 71 kg  Vital Signs: Temp: 98.2 F (36.8 C) (09/09 1255) Temp Source: Oral (09/09 1255) BP: 149/76 (09/09 1746) Pulse Rate: 70 (09/09 1255)  Labs: Recent Labs    08/18/18 1707  08/18/18 2305 08/19/18 0603 08/19/18 1251 08/20/18 0621 08/20/18 0843 08/20/18 1406  HGB  --   --   --  11.1*  --  10.4* 10.6*  --   HCT  --   --   --  34.3*  --  32.1* 32.3*  --   PLT  --   --   --  345  --  312 253  --   HEPARINUNFRC  --    < > 0.17* 0.50 0.37 0.35  --   --   CREATININE  --   --   --  1.42*  --  1.43* 1.45* 1.38*  TROPONINI 0.32*  --  0.44* 0.68*  --   --   --   --    < > = values in this interval not displayed.    Estimated Creatinine Clearance: 51.9 mL/min (A) (by C-G formula based on SCr of 1.38 mg/dL (H)).   Medical History: Past Medical History:  Diagnosis Date  . Diabetes mellitus   . GERD (gastroesophageal reflux disease)   . Hyperlipidemia   . Hypertension   . Tobacco abuse      Assessment: 7 YOM presented with chest pain and SOB.  Troponin elevated and Pharmacy consulted to begin IV heparin for ACS.   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 Pharmacy consulted to restart heparin 8 hours after sheath removal (documented at 1710). Will resume at previous rate on 9/10 at 0100. No s/sx of bleeding. Hgb 10.6, plt  253.   Goal of Therapy:  Heparin level 0.3-0.7 units/ml Monitor platelets by anticoagulation protocol: Yes   Plan:  Restart heparin 1100 units/hr on 9/10 at 0100 Obtain 6 hour heparin level Daily heparin level and CBC  Girard Cooter, PharmD Clinical Pharmacist  Pager: [phone] Phone: [phone] Please check Amion for pharmacy contact number

## 2018-08-20 NOTE — Progress Notes (Signed)
 Progress Note  Patient Name: Colin Keller Date of Encounter: 08/20/2018  Primary Cardiologist: Brian Crenshaw, MD   Subjective   Had some brief sharp chest pains overnight but nothing today. Concerned about pending heart cath. Discussed procedure and potential outcomes again, all questions answered.  Inpatient Medications    Scheduled Meds: . amLODipine  5 mg Oral Daily  . aspirin EC  81 mg Oral Daily  . carvedilol  3.125 mg Oral BID WC  . famotidine  20 mg Oral Daily  . insulin aspart  0-5 Units Subcutaneous QHS  . insulin aspart  0-9 Units Subcutaneous TID WC  . nitroGLYCERIN  0.5 inch Topical Q6H  . pravastatin  20 mg Oral Once per day on Mon Wed Fri  . sodium chloride flush  3 mL Intravenous Q12H   Continuous Infusions: . sodium chloride 10 mL/hr at 08/19/18 0624  . sodium chloride    . sodium chloride    . heparin 1,100 Units/hr (08/20/18 0908)   PRN Meds: sodium chloride, acetaminophen, ALPRAZolam, HYDROcodone-acetaminophen, nitroGLYCERIN, ondansetron (ZOFRAN) IV, sodium chloride flush   Vital Signs    Vitals:   08/19/18 1158 08/19/18 1615 08/19/18 1952 08/20/18 0021  BP: 128/71 127/67 126/69 126/66  Pulse: 78 68 70 66  Resp: 18 18  19  Temp: 97.8 F (36.6 C) 98 F (36.7 C) 98.6 F (37 C) 98.3 F (36.8 C)  TempSrc: Oral Oral Oral Oral  SpO2: 98% 99% 98% 97%  Weight:    67.9 kg  Height:        Intake/Output Summary (Last 24 hours) at 08/20/2018 1121 Last data filed at 08/20/2018 0600 Gross per 24 hour  Intake 480 ml  Output 2011 ml  Net -1531 ml   Filed Weights   08/19/18 0015 08/19/18 0550 08/20/18 0021  Weight: 68.4 kg 67 kg 67.9 kg    Telemetry    Sinus - Personally Reviewed  Physical Exam   GEN: No acute distress.   Neck: No JVD. L carotid bruit. Cardiac: RRR, no murmurs, rubs, or gallops. 2+ bilateral radial and DP pulses Respiratory: Clear to auscultation bilaterally. GI: Soft, nontender, non-distended  MS: No edema Neuro:  Nonfocal   Psych: Normal affect   Labs    Chemistry Recent Labs  Lab 08/18/18 1707 08/19/18 0603 08/20/18 0621 08/20/18 0843  NA  --  137 135 135  K  --  4.6 4.5 5.1  CL  --  99 102 101  CO2  --  22 22 17*  GLUCOSE  --  358* 439* 474*  BUN  --  26* 29* 29*  CREATININE  --  1.42* 1.43* 1.45*  CALCIUM  --  9.6 8.9 8.8*  PROT 6.5  --   --   --   ALBUMIN 3.5  --   --   --   AST 21  --   --   --   ALT 22  --   --   --   ALKPHOS 75  --   --   --   BILITOT 1.0  --   --   --   GFRNONAA  --  51* 50* 49*  GFRAA  --  59* 58* 57*  ANIONGAP  --  16* 11 17*     Hematology Recent Labs  Lab 08/19/18 0603 08/20/18 0621 08/20/18 0843  WBC 8.9 8.9 8.7  RBC 3.54* 3.28* 3.32*  HGB 11.1* 10.4* 10.6*  HCT 34.3* 32.1* 32.3*  MCV 96.9 97.9 97.3    MCH 31.4 31.7 31.9  MCHC 32.4 32.4 32.8  RDW 12.7 12.8 13.0  PLT 345 312 253    Cardiac Enzymes Recent Labs  Lab 08/18/18 1401 08/18/18 1707 08/18/18 2305 08/19/18 0603  TROPONINI 0.38* 0.32* 0.44* 0.68*    Recent Labs  Lab 08/18/18 1409  TROPIPOC 0.29*     BNP Recent Labs  Lab 08/18/18 1401  BNP 1,083.3*     Radiology    Dg Chest 2 View  Result Date: 08/18/2018 CLINICAL DATA:  Chest pain and shortness of breath for 2 weeks. Nonproductive cough. Smoker. EXAM: CHEST - 2 VIEW COMPARISON:  None. FINDINGS: The cardiomediastinal silhouette is within normal limits. Aortic atherosclerosis is noted. The lungs are mildly hyperinflated with peribronchial thickening and mild diffuse interstitial prominence. There are small bilateral pleural effusions. No overt pulmonary edema, focal airspace consolidation, or pneumothorax is identified. No acute osseous abnormality is seen. IMPRESSION: Hyperinflation with bronchitic changes and small pleural effusions. Electronically Signed   By: Allen  Grady M.D.   On: 08/18/2018 15:54   CV studies: Echo 08/19/18 Study Conclusions  - Left ventricle: The cavity size was normal. There was mild   concentric  hypertrophy. Systolic function was normal. The   estimated ejection fraction was in the range of 50% to 55%.   Hypokinesis of the basal-midinferolateral and inferior   myocardium. Doppler parameters are consistent with abnormal left   ventricular relaxation (grade 1 diastolic dysfunction). Doppler   parameters are consistent with indeterminate ventricular filling   pressure. - Aortic valve: Transvalvular velocity was within the normal range.   There was no stenosis. There was no regurgitation. Valve area   (VTI): 2.32 cm^2. Valve area (Vmax): 2.66 cm^2. Valve area   (Vmean): 2.66 cm^2. - Mitral valve: Moderately calcified annulus. Transvalvular   velocity was within the normal range. There was no evidence for   stenosis. There was trivial regurgitation. - Left atrium: The atrium was severely dilated. - Right ventricle: The cavity size was normal. Wall thickness was   normal. Systolic function was normal. - Atrial septum: No defect or patent foramen ovale was identified   by color flow Doppler. - Tricuspid valve: There was no regurgitation. - Pulmonary arteries: Systolic pressure was within the normal   range. PA peak pressure: 9 mm Hg (S).  Patient Profile     62-year-old male with past medical history of diabetes mellitus, hypertension, hyperlipidemia, tobacco abuse with  non-ST elevation myocardial infarction and acute diastolic heart failure.    Assessment & Plan    1 new onset diastolic heart failure- -normal LV EF on echo but focal WMA of the basal-midinferolateral and inferior walls. Grade 1 diastolic dysfunction. -appears euvolemic today, received one day of diuresis on admission -holding ACEI in anticipation of cath, plan to restart if renal function stable post cath -on carvedilol  2 NSTEMI -Continue aspirin, heparin and carvedilol.  Continue low-dose pravastatin (patient did not tolerate high-dose statins in the past). -pending cath today. All outcomes discussed,  questions answered  3 chronic stage III kidney disease -holding ACE inhibitor and further diuresis -not hydrated prior to cath given acute diastolic heart failure on admission -Limit dye.  No ventriculogram.  Follow renal function closely after procedure.  Patient understands risk of contrast nephropathy.    4 tobacco abuse -patient counseled on discontinuing.  5 hyperlipidemia -continue pravastatin; he did not tolerate high-dose statins in the past.  Will likely follow-up in lipid clinic for consideration of Repatha.  6 diabetes mellitus -Glucophage   on hold.  Hemoglobin A1c elevated.  Would arrange endocrinology evaluation following discharge.  7 hypertension -holding ACE inhibitor in anticipation of catheterization.     -Continue amlodipine and carvedilol  8 left carotid bruit-he will need follow-up carotid Dopplers after discharge.  9 anemia-question contribution from renal insufficiency.  MCV is elevated.    B12 (normal) and folate (pending).  For questions or updates, please contact [facility] Please consult [email or web address] for contact info under     Signed, Jaelynne Hockley, MD  08/20/2018, 11:21 AM   

## 2018-08-20 NOTE — Progress Notes (Addendum)
Inpatient Diabetes Program Recommendations  AACE/ADA: New Consensus Statement on Inpatient Glycemic Control (2015)  Target Ranges:  Prepandial:   less than 140 mg/dL      Peak postprandial:   less than 180 mg/dL (1-2 hours)      Critically ill patients:  140 - 180 mg/dL   Lab Results  Component Value Date   GLUCAP 403 (H) 08/20/2018   HGBA1C 9.5 (H) 08/18/2018    Review of Glycemic ControlResults for Colin Keller, Colin Keller (MRN [id number]) as of 08/20/2018 11:45  Ref. Range 08/19/2018 07:51 08/19/2018 11:57 08/19/2018 16:36 08/19/2018 20:59 08/20/2018 08:25 08/20/2018 09:48  Glucose-Capillary Latest Ref Range: 70 - 99 mg/dL 361 (H) 443 (H) 154 (H) 478 (H) 456 (H) 403 (H)    Diabetes history: DM (Type 2 Per problem list) Outpatient Diabetes medications:  Tresiba 12 units daily, Novolog 3 units breakfast, Novolog 3 units lunch, and Novolog 7 units with supper Current orders for Inpatient glycemic control:  Novolog sensitive tid with meals and HS  Inpatient Diabetes Program Recommendations:    Note blood sugar> 400 mg/dL this morning.  CO2=17 and AG=17.  No basal insulin given since being in hospital.  ? Need for IV insulin or DKA order set.  Likely needs restart of basal insulin.  Consider Lantus 12 units daily and Novolog 3 units tid with meals (meal coverage- hold if patient eats less than 50% or NPO).  Paged PA to discuss. While NPO if IV insulin not started, change Novolog to q 4 hours.  Thanks  Beryl Meager, RN, BC-ADM Inpatient Diabetes Coordinator Pager [phone] (8a-5p)

## 2018-08-20 NOTE — Progress Notes (Signed)
ANTICOAGULATION CONSULT NOTE  Pharmacy Consult:  Heparin Indication: chest pain/ACS  Allergies  Allergen Reactions  . Amitriptyline Other (See Comments)    Urinary retention  . Carvedilol Diarrhea and Other (See Comments)    GI discomfort and hip pain  . Cyclobenzaprine Other (See Comments)    Made patient "restleness, twitchy"   . Nortriptyline Hcl Other (See Comments)    Vivid / bad dreams  . Simvastatin Other (See Comments)    Arthralgias    Patient Measurements: Height: 5\' 11"  (180.3 cm) Weight: 149 lb 11.2 oz (67.9 kg)(Scale C) IBW/kg (Calculated) : 75.3 Heparin Dosing Weight: 71 kg  Vital Signs: Temp: 98.3 F (36.8 C) (09/09 0021) Temp Source: Oral (09/09 0021) BP: 126/66 (09/09 0021) Pulse Rate: 66 (09/09 0021)  Labs: Recent Labs    08/18/18 1401 08/18/18 1707  08/18/18 2305 08/19/18 0603 08/19/18 1251 08/20/18 0621 08/20/18 0843  HGB 9.9*  --   --   --  11.1*  --  10.4* 10.6*  HCT 31.1*  --   --   --  34.3*  --  32.1* 32.3*  PLT 315  --   --   --  345  --  312 253  HEPARINUNFRC  --   --    < > 0.17* 0.50 0.37 0.35  --   CREATININE 1.39*  --   --   --  1.42*  --  1.43*  --   TROPONINI 0.38* 0.32*  --  0.44* 0.68*  --   --   --    < > = values in this interval not displayed.    Estimated Creatinine Clearance: 50.1 mL/min (A) (by C-G formula based on SCr of 1.43 mg/dL (H)).   Medical History: Past Medical History:  Diagnosis Date  . Diabetes mellitus   . GERD (gastroesophageal reflux disease)   . Hyperlipidemia   . Hypertension   . Tobacco abuse      Assessment: 57 YOM presented with chest pain and SOB.  Troponin elevated and Pharmacy consulted to begin IV heparin for ACS. Plans noted for cath today -Heparin level= 0.35   Goal of Therapy:  Heparin level 0.3-0.7 units/ml Monitor platelets by anticoagulation protocol: Yes   Plan:  Continue heparin 1100 units/hr Daily heparin level and CBC  Harland German, PharmD Clinical  Pharmacist Please check Amion for pharmacy contact number

## 2018-08-20 NOTE — Progress Notes (Signed)
Patient CBG 456, NP [place] paged, gave verbal order for RN to gave 400 S/S coverage.

## 2018-08-20 NOTE — Interval H&P Note (Signed)
Cath Lab Visit (complete for each Cath Lab visit)  Clinical Evaluation Leading to the Procedure:   ACS: Yes.    Non-ACS:    Anginal Classification: CCS IV  Anti-ischemic medical therapy: Minimal Therapy (1 class of medications)  Non-Invasive Test Results: No non-invasive testing performed  Prior CABG: No previous CABG      History and Physical Interval Note:  08/20/2018 4:10 PM  Colin Keller  has presented today for surgery, with the diagnosis of NSTEMI  The various methods of treatment have been discussed with the patient and family. After consideration of risks, benefits and other options for treatment, the patient has consented to  Procedure(s): LEFT HEART CATH AND CORONARY ANGIOGRAPHY (N/A) as a surgical intervention .  The patient's history has been reviewed, patient examined, no change in status, stable for surgery.  I have reviewed the patient's chart and labs.  Questions were answered to the patient's satisfaction.     Tonny Bollman

## 2018-08-21 ENCOUNTER — Inpatient Hospital Stay (HOSPITAL_COMMUNITY): Payer: Self-pay

## 2018-08-21 ENCOUNTER — Other Ambulatory Visit: Payer: Self-pay | Admitting: *Deleted

## 2018-08-21 ENCOUNTER — Encounter (HOSPITAL_COMMUNITY): Payer: Self-pay | Admitting: Thoracic Surgery (Cardiothoracic Vascular Surgery)

## 2018-08-21 DIAGNOSIS — I25110 Atherosclerotic heart disease of native coronary artery with unstable angina pectoris: Secondary | ICD-10-CM

## 2018-08-21 DIAGNOSIS — Z01810 Encounter for preprocedural cardiovascular examination: Secondary | ICD-10-CM

## 2018-08-21 DIAGNOSIS — I5031 Acute diastolic (congestive) heart failure: Secondary | ICD-10-CM

## 2018-08-21 DIAGNOSIS — I214 Non-ST elevation (NSTEMI) myocardial infarction: Secondary | ICD-10-CM

## 2018-08-21 DIAGNOSIS — I2510 Atherosclerotic heart disease of native coronary artery without angina pectoris: Secondary | ICD-10-CM

## 2018-08-21 LAB — URINALYSIS, COMPLETE (UACMP) WITH MICROSCOPIC
BACTERIA UA: NONE SEEN
Bilirubin Urine: NEGATIVE
Glucose, UA: >=500 mg/dL — AB
HGB URINE DIPSTICK: NEGATIVE
Ketones, ur: NEGATIVE mg/dL
LEUKOCYTES UA: NEGATIVE
Nitrite: NEGATIVE
PROTEIN: 30 mg/dL — AB
SPECIFIC GRAVITY, URINE: 1.016 (ref 1.005–1.030)
pH: 6 (ref 5.0–8.0)

## 2018-08-21 LAB — CBC
HEMATOCRIT: 33 % — AB (ref 39.0–52.0)
HEMOGLOBIN: 10.9 g/dL — AB (ref 13.0–17.0)
MCH: 32.3 pg (ref 26.0–34.0)
MCHC: 33 g/dL (ref 30.0–36.0)
MCV: 97.9 fL (ref 78.0–100.0)
Platelets: 320 10*3/uL (ref 150–400)
RBC: 3.37 MIL/uL — ABNORMAL LOW (ref 4.22–5.81)
RDW: 12.9 % (ref 11.5–15.5)
WBC: 9.3 10*3/uL (ref 4.0–10.5)

## 2018-08-21 LAB — PULMONARY FUNCTION TEST
FEF 25-75 PRE: 1.62 L/s
FEF2575-%Pred-Pre: 56 %
FEV1-%Pred-Pre: 73 %
FEV1-Pre: 2.63 L
FEV1FVC-%PRED-PRE: 94 %
FEV6-%PRED-PRE: 80 %
FEV6-PRE: 3.67 L
FEV6FVC-%Pred-Pre: 103 %
FVC-%Pred-Pre: 77 %
FVC-PRE: 3.72 L
Pre FEV1/FVC ratio: 71 %
Pre FEV6/FVC Ratio: 99 %

## 2018-08-21 LAB — FOLATE RBC
FOLATE, RBC: 1112 ng/mL (ref >498)
Folate, Hemolysate: 360.3 ng/mL
HEMATOCRIT: 32.4 % — AB (ref 37.5–51.0)

## 2018-08-21 LAB — GLUCOSE, CAPILLARY
GLUCOSE-CAPILLARY: 145 mg/dL — AB (ref 70–99)
GLUCOSE-CAPILLARY: 341 mg/dL — AB (ref 70–99)
Glucose-Capillary: 190 mg/dL — ABNORMAL HIGH (ref 70–99)
Glucose-Capillary: 153 mg/dL — ABNORMAL HIGH (ref 70–99)
Glucose-Capillary: 247 mg/dL — ABNORMAL HIGH (ref 70–99)
Glucose-Capillary: 275 mg/dL — ABNORMAL HIGH (ref 70–99)

## 2018-08-21 LAB — BASIC METABOLIC PANEL
Anion gap: 8 (ref 5–15)
BUN: 22 mg/dL (ref 8–23)
CHLORIDE: 108 mmol/L (ref 98–111)
CO2: 24 mmol/L (ref 22–32)
CREATININE: 1.28 mg/dL — AB (ref 0.61–1.24)
Calcium: 9.1 mg/dL (ref 8.9–10.3)
GFR calc Af Amer: >60 mL/min (ref >60)
GFR calc non Af Amer: 58 mL/min — ABNORMAL LOW (ref >60)
GLUCOSE: 166 mg/dL — AB (ref 70–99)
Potassium: 4 mmol/L (ref 3.5–5.1)
Sodium: 140 mmol/L (ref 135–145)

## 2018-08-21 LAB — HEPARIN LEVEL (UNFRACTIONATED): HEPARIN UNFRACTIONATED: 0.29 [IU]/mL — AB (ref 0.30–0.70)

## 2018-08-21 NOTE — Progress Notes (Signed)
Progress Note  Patient Name: Colin Keller Date of Encounter: 08/21/2018  Primary Cardiologist: Olga Millers, MD   Subjective   Heart cath revealed surgical disease. Patient planned for CABG on 9/12 AM. He feels well overall, no chest pain. Has committed to quitting tobacco entirely, changing his lifestyle. Upbeat and optimistic. Questions answered today.  Inpatient Medications    Scheduled Meds: . amLODipine  5 mg Oral Daily  . aspirin EC  81 mg Oral Daily  . carvedilol  3.125 mg Oral BID WC  . famotidine  20 mg Oral Daily  . insulin aspart  0-9 Units Subcutaneous Q4H  . insulin glargine  12 Units Subcutaneous Daily  . nitroGLYCERIN  0.5 inch Topical Q6H  . pravastatin  20 mg Oral Once per day on Mon Wed Fri  . sodium chloride flush  3 mL Intravenous Q12H   Continuous Infusions: . sodium chloride 10 mL/hr at 08/19/18 0624  . sodium chloride    . heparin 1,100 Units/hr (08/21/18 0104)   PRN Meds: sodium chloride, acetaminophen, ALPRAZolam, HYDROcodone-acetaminophen, nitroGLYCERIN, ondansetron (ZOFRAN) IV, sodium chloride flush   Vital Signs    Vitals:   08/20/18 1943 08/20/18 1947 08/21/18 0006 08/21/18 0447  BP: 121/63 (!) 123/57 114/65 116/69  Pulse:  68  64  Resp:    16  Temp:  98.3 F (36.8 C)  98 F (36.7 C)  TempSrc:  Oral  Oral  SpO2:  98%  98%  Weight:    67.2 kg  Height:        Intake/Output Summary (Last 24 hours) at 08/21/2018 0739 Last data filed at 08/21/2018 0452 Gross per 24 hour  Intake 599.42 ml  Output 400 ml  Net 199.42 ml   Filed Weights   08/19/18 0550 08/20/18 0021 08/21/18 0447  Weight: 67 kg 67.9 kg 67.2 kg    Telemetry    Sinus - Personally Reviewed  Physical Exam   GEN: No acute distress.   Neck: No JVD. L carotid bruit. Cardiac: RRR, no murmurs, rubs, or gallops. 2+ bilateral radial and DP pulses Respiratory: Clear to auscultation bilaterally. GI: Soft, nontender, non-distended  MS: No edema Neuro:  Nonfocal    Psych: Normal affect   Labs    Chemistry Recent Labs  Lab 08/18/18 1707  08/20/18 0621 08/20/18 0843 08/20/18 1406  NA  --    < > 135 135 139  K  --    < > 4.5 5.1 4.0  CL  --    < > 102 101 104  CO2  --    < > 22 17* 22  GLUCOSE  --    < > 439* 474* 293*  BUN  --    < > 29* 29* 28*  CREATININE  --    < > 1.43* 1.45* 1.38*  CALCIUM  --    < > 8.9 8.8* 9.3  PROT 6.5  --   --   --   --   ALBUMIN 3.5  --   --   --   --   AST 21  --   --   --   --   ALT 22  --   --   --   --   ALKPHOS 75  --   --   --   --   BILITOT 1.0  --   --   --   --   GFRNONAA  --    < > 50* 49* 53*  GFRAA  --    < >  58* 57* >60  ANIONGAP  --    < > 11 17* 13   < > = values in this interval not displayed.     Hematology Recent Labs  Lab 08/19/18 0603 08/20/18 0621 08/20/18 0843  WBC 8.9 8.9 8.7  RBC 3.54* 3.28* 3.32*  HGB 11.1* 10.4* 10.6*  HCT 34.3* 32.1* 32.3*  MCV 96.9 97.9 97.3  MCH 31.4 31.7 31.9  MCHC 32.4 32.4 32.8  RDW 12.7 12.8 13.0  PLT 345 312 253    Cardiac Enzymes Recent Labs  Lab 08/18/18 1401 08/18/18 1707 08/18/18 2305 08/19/18 0603  TROPONINI 0.38* 0.32* 0.44* 0.68*    Recent Labs  Lab 08/18/18 1409  TROPIPOC 0.29*     BNP Recent Labs  Lab 08/18/18 1401  BNP 1,083.3*     Radiology    No results found. CV studies: Echo 08/19/18 Study Conclusions  - Left ventricle: The cavity size was normal. There was mild   concentric hypertrophy. Systolic function was normal. The   estimated ejection fraction was in the range of 50% to 55%.   Hypokinesis of the basal-midinferolateral and inferior   myocardium. Doppler parameters are consistent with abnormal left   ventricular relaxation (grade 1 diastolic dysfunction). Doppler   parameters are consistent with indeterminate ventricular filling   pressure. - Aortic valve: Transvalvular velocity was within the normal range.   There was no stenosis. There was no regurgitation. Valve area   (VTI): 2.32 cm^2. Valve  area (Vmax): 2.66 cm^2. Valve area   (Vmean): 2.66 cm^2. - Mitral valve: Moderately calcified annulus. Transvalvular   velocity was within the normal range. There was no evidence for   stenosis. There was trivial regurgitation. - Left atrium: The atrium was severely dilated. - Right ventricle: The cavity size was normal. Wall thickness was   normal. Systolic function was normal. - Atrial septum: No defect or patent foramen ovale was identified   by color flow Doppler. - Tricuspid valve: There was no regurgitation. - Pulmonary arteries: Systolic pressure was within the normal   range. PA peak pressure: 9 mm Hg (S).  Cath 08/20/18  Mid LM to Dist LM lesion is 60% stenosed.  Ost LAD to Prox LAD lesion is 30% stenosed.  Prox LAD to Mid LAD lesion is 30% stenosed.  Ost Cx to Prox Cx lesion is 99% stenosed.  Prox RCA to Mid RCA lesion is 95% stenosed.   1.  Moderately severe distal left main stenosis with positive FFR of 0.75 2.  Critical heavily calcified mid RCA stenosis 3.  Critical heavily calcified proximal circumflex stenosis 4.  Nonobstructive LAD stenosis 5.  Mild segmental LV dysfunction with inferolateral hypokinesis and LVEF 50 to 55% by echo assessment 6.  Normal LVEDP  Recommendations: Surgical evaluation for consideration of CABG.  The patient will be restarted on heparin after his sheath is out.  Case discussed with Dr. Tyrone Sage. Pt stable/chest pain free on heparin.   Patient Profile     62 year old male with past medical history of diabetes mellitus, hypertension, hyperlipidemia, tobacco abuse with  non-ST elevation myocardial infarction and acute diastolic heart failure.    Assessment & Plan    1 new onset diastolic heart failure- -normal LV EF on echo but focal WMA of the basal-midinferolateral and inferior walls. Grade 1 diastolic dysfunction. -appears euvolemic today, received one day of diuresis on admission -holding ACEI in anticipation of cath, given  possible plans for CABG and good BP control will continue to hold -on  carvedilol  2 NSTEMI -Continue aspirin, heparin and carvedilol.  Continue low-dose pravastatin (patient did not tolerate high-dose statins in the past). -if he receives CABG, should be on DAPT post CABG for 12 months given NSTEMI presentation  3 chronic stage III kidney disease -holding ACE inhibitor and further diuresis -not hydrated prior to cath given acute diastolic heart failure on admission  4 tobacco abuse -patient counseled on discontinuing. He is amenable.  5 hyperlipidemia -continue pravastatin; he did not tolerate high-dose statins in the past.  Will likely follow-up in lipid clinic for consideration of Repatha.  6 diabetes mellitus -Glucophage on hold.  Hemoglobin A1c elevated.  Would arrange endocrinology evaluation following discharge.  7 hypertension -holding ACE inhibitor -Continue amlodipine and carvedilol  8 left carotid bruit-will need Dopplers prior to CABG  9 anemia-question contribution from renal insufficiency.  MCV is elevated.    B12 (normal) and folate (pending).  For questions or updates, please contact [facility] Please consult [email or web address] for contact info under     Signed, Jodelle Red, MD  08/21/2018, 7:39 AM

## 2018-08-21 NOTE — Consult Note (Addendum)
[address]       [place] [address]             [phone]        [place] Medical Record #[id number] Date of Birth: [birth year]  Referring: Dr. Excell Seltzer, MD Primary Care: [name], DO Primary Cardiologist:Brian Jens Som, MD  Chief Complaint:    Chief Complaint  Patient presents with  . Worsening shortness of breath   Reason for consultation: Coronary artery disease  History of Present Illness:     This is a 62 year old male with a past medical history of hypertension, hyperlipidemia, diabetes mellitus, tobacco abuse  who was in his normal state of health until 09/08 when he had more shortness of breath than usual. He was unable to lie flat at night and most of his shortness of breath symptoms occurred at night. Upon thinking back, patient states has had shortness of breath and fatigue over last several weeks. He did have an episode of pain across his chest, but was doing heavy yard work and thought he may have pulled a muscle. He presented to [facility] ED for further evaluation and treatment.  EKG on admission showed sinus rhythm, LVH, and ST depression. Troponin I peaked at 0.68. He ruled in for a NSTEMI. In addition, BNP was 1.083 so he was given Lasix.  Echo showed LVEF 50-55% and no significant valvular stenosis. Cardiac catheterization done on 08/20/2018 showed moderately severe distal left main stenosis, critical heavily calcified mid RCA stenosis, and critical heavily calcified proximal Circumflex stenosis. A cardiothoracic consultation has been obtained with Dr. Cornelius Moras for the consideration of coronary artery bypass grafting surgery. Currently, he is on Heparin drip, vital signs are stable and he denies chest pain or shortness of breath.   Current Activity/ Functional Status: Patient is independent with mobility/ambulation, transfers, ADL's, IADL's.   Zubrod Score: At the time of surgery this patient's most appropriate activity  status/level should be described as: []     0    Normal activity, no symptoms [x]     1    Restricted in physical strenuous activity but ambulatory, able to do out light work []     2    Ambulatory and capable of self care, unable to do work activities, up and about                 more than 50%  Of the time                            []     3    Only limited self care, in bed greater than 50% of waking hours []     4    Completely disabled, no self care, confined to bed or chair []     5    Moribund  Past Medical History:  Diagnosis Date  . Diabetes mellitus   . GERD (gastroesophageal reflux disease)   . Hyperlipidemia   . Hypertension   . Tobacco abuse   . Tobacco abuse disorder    Qualifier: Diagnosis of  By: Lafonda Mosses MD, Darl Pikes    Diabetic retinopathy  Past Surgical History:  Procedure Laterality Date  . INTRAVASCULAR PRESSURE WIRE/FFR STUDY N/A 08/20/2018   Procedure: INTRAVASCULAR PRESSURE WIRE/FFR STUDY;  Surgeon: Tonny Bollman, MD;  Location: [facility] INVASIVE CV LAB;  Service: Cardiovascular;  Laterality: N/A;  . LEFT HEART CATH AND CORONARY ANGIOGRAPHY N/A  08/20/2018   Procedure: LEFT HEART CATH AND CORONARY ANGIOGRAPHY;  Surgeon: Tonny Bollman, MD;  Location: [facility] INVASIVE CV LAB;  Service: Cardiovascular;  Laterality: N/A;  Lasix eye surgery for diabetic retinopathy  Social History   Tobacco Use  Smoking Status Current Every Day Smoker  . Packs/day: 0.50  . Years: 45.00  . Pack years: 22.50  . Types: Cigarettes  . 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  Family History: Mother deceased and had COPD (although she did not smoke, she did work at [employer]) Father deceased at age 44 and he had several heart attacks  Allergies  Allergen Reactions  . Amitriptyline Other (See Comments)      Urinary retention  . Carvedilol Diarrhea and Other (See Comments)    GI discomfort and hip pain  . Cyclobenzaprine Other (See Comments)    Made patient "restleness, twitchy"   . Nortriptyline Hcl Other (See Comments)    Vivid / bad dreams  . Simvastatin Other (See Comments)    Arthralgias    Current Facility-Administered Medications  Medication Dose Route Frequency Provider Last Rate Last Dose  . 0.9 %  sodium chloride infusion   Intravenous Continuous Leone Brand, NP 10 mL/hr at 08/19/18 1610    . 0.9 %  sodium chloride infusion  250 mL Intravenous PRN Tonny Bollman, MD      . acetaminophen (TYLENOL) tablet 650 mg  650 mg Oral Q4H PRN Leone Brand, NP      . ALPRAZolam Prudy Feeler) tablet 0.25 mg  0.25 mg Oral BID PRN Leone Brand, NP   0.25 mg at 08/20/18 2029  . amLODipine (NORVASC) tablet 5 mg  5 mg Oral Daily Nada Boozer R, NP   5 mg at 08/19/18 2200  . aspirin EC tablet 81 mg  81 mg Oral Daily Leone Brand, NP   81 mg at 08/21/18 9604  . carvedilol (COREG) tablet 3.125 mg  3.125 mg Oral BID WC Leone Brand, NP   3.125 mg at 08/21/18 5409  . famotidine (PEPCID) tablet 20 mg  20 mg Oral Daily Nada Boozer R, NP   20 mg at 08/21/18 8119  . heparin ADULT infusion 100 units/mL (25000 units/267mL sodium chloride 0.45%)  1,100 Units/hr Intravenous Continuous Jodelle Red, MD 11 mL/hr at 08/21/18 0104 1,100 Units/hr at 08/21/18 0104  . HYDROcodone-acetaminophen (NORCO) 10-325 MG per tablet 1 tablet  1 tablet Oral Q8H PRN Leone Brand, NP   1 tablet at 08/20/18 2029  . insulin aspart (novoLOG) injection 0-9 Units  0-9 Units Subcutaneous Q4H Bhagat, Bhavinkumar, PA   2 Units at 08/21/18 0834  . insulin glargine (LANTUS) injection 12 Units  12 Units Subcutaneous Daily [place], Bhavinkumar, PA   12 Units at 08/21/18 0834  . nitroGLYCERIN (NITROGLYN) 2 % ointment 0.5 inch  0.5 inch Topical Q6H Leone Brand, NP   0.5 inch at 08/21/18 0502  . nitroGLYCERIN  (NITROSTAT) SL tablet 0.4 mg  0.4 mg Sublingual Q5 Min x 3 PRN Leone Brand, NP      . ondansetron [facility]) injection 4 mg  4 mg Intravenous Q6H PRN Leone Brand, NP      . pravastatin (PRAVACHOL) tablet 20 mg  20 mg Oral Once per day on Mon Wed  Caleen Essex Leone Brand, NP   20 mg at 08/20/18 1220  . sodium chloride flush (NS) 0.9 % injection 3 mL  3 mL Intravenous Q12H Tonny Bollman, MD   3 mL at 08/21/18 0837  . sodium chloride flush (NS) 0.9 % injection 3 mL  3 mL Intravenous PRN Tonny Bollman, MD        Medications Prior to Admission  Medication Sig Dispense Refill Last Dose  . amLODipine (NORVASC) 10 MG tablet Take 1 tablet (10 mg total) by mouth daily. 90 tablet 3 08/16/2018  . aspirin 81 MG tablet Take 81 mg by mouth daily.   08/17/2018 at pm  . HYDROcodone-acetaminophen (NORCO) 10-325 MG tablet Take 1 tablet by mouth every 8 (eight) hours as needed. 90 tablet 0 08/17/2018 at Unknown time  . insulin aspart (NOVOLOG) 100 UNIT/ML injection Inject 3-7 Units into the skin 2 (two) times daily with a meal. 4-5 units BKFST, 4-7 units with dinner 10 mL 0 08/17/2018 at Unknown time  . insulin degludec (TRESIBA FLEXTOUCH) 100 UNIT/ML SOPN FlexTouch Pen Inject 0.12 mLs (12 Units total) into the skin every morning. 2 pen 0 08/17/2018 at Unknown time  . lisinopril (PRINIVIL,ZESTRIL) 20 MG tablet Take 1 tablet (20 mg total) by mouth daily. 90 tablet 3 08/17/2018 at Unknown time  . Menthol, Topical Analgesic, (BIOFREEZE ROLL-ON) 4 % GEL Apply 1 application topically as needed (shoulder pain).   08/06/2018  . metFORMIN (GLUCOPHAGE) 1000 MG tablet Take 1 tablet (1,000 mg total) by mouth 2 (two) times daily. 180 tablet 3 08/17/2018 at Unknown time  . pravastatin (PRAVACHOL) 20 MG tablet Take 1 tablet (20 mg total) by mouth 3 (three) times a week. 30 tablet 2 08/16/2018  . ranitidine (ZANTAC) 75 MG tablet Take 150 mg by mouth daily as needed.    08/17/2018 at Unknown time  . tamsulosin (FLOMAX) 0.4 MG CAPS capsule Take 1  capsule (0.4 mg total) by mouth daily. 30 capsule 3 Past Week at Unknown time    Family History  Problem Relation Age of Onset  . Heart disease Mother   . Diabetes Mother   . Heart failure Mother   . Heart disease Father   . Sudden death Brother    Review of Systems:     Cardiac Review of Systems: Y or  [ N   ]= no  Resting SOB [  Y ]  Orthopnea [Y  ]   Pedal Edema [  N ]    Palpitations [ N ] Syncope  [ N ]   Presyncope [  N ]  General Review of Systems: [Y] = yes [N  ]=no Constitional:  fatigue [ Y ]; nausea [ N ]; night sweats Klaus.Mock  ]; fever [ N ]; or chills [  N]                                                               Dental: Last Dentist visit: Many years ago. Most of teeth are missing   Eye :  Amaurosis fugax[ N ]; Resp: cough [ N ];  wheezing[N  ];  hemoptysis[ N ];  GI:  vomiting[N  ];  dysphagia[ N ]; melena[N  ];  hematochezia Klaus.Mock  ];  GU:  hematuria[  N];  dysuria [ N ];               Skin: rash, swelling[ N ];, hair loss[N  ];   Musculosketetal:  joint swelling[ N ];  joint erythema[ N ];   Heme/Lymph:anemia[Y  ];  Neuro: TIA[ N ];    stroke[N  ];  vertigo[N  ];  seizures[N  ];     Psych:depression[ N ]; anxiety[ N ];  Endocrine: diabetes[ Y ];  thyroid dysfunction[ N ];               Physical Exam: BP 116/69   Pulse 64   Temp 98 F (36.7 C) (Oral)   Resp 16   Ht 5\' 11"  (1.803 m)   Wt 67.2 kg Comment:  c  SpO2 98%   BMI 20.67 kg/m    General appearance: alert, cooperative and no distress Head: Normocephalic, without obvious abnormality, atraumatic Neck: no carotid bruit, no JVD and supple, symmetrical, trachea midline Resp: clear to auscultation bilaterally Cardio: RRR, no murmur GI: Soft, non tender, bowel sounds present Extremities: No LE edema;Palpable DP/PT bilaterally Neurologic: Grossly normal  Diagnostic Studies & Laboratory data:   ------------------------------------------------------------------- Transthoracic  Echocardiography  Patient:    Colin Keller, Colin Keller MR #:       [id number] Study Date: 08/19/2018 Gender:     M Age:        25 Height:     180.3 cm Weight:     67 kg BSA:        1.82 m^2 Pt. Status: Room:       3E19C   ADMITTING    Olga Millers  ORDERING     Leone Brand  REFERRING    Nada Boozer R  ATTENDING    Jacalyn Lefevre  PERFORMING   [facility] Inpatient  SONOGRAPHER  [facility]  cc:  ------------------------------------------------------------------- LV EF: 50% -   55%  ------------------------------------------------------------------- Indications:      Chest pain 786.51.  ------------------------------------------------------------------- History:   PMH:  Chronic kidney disease  Congestive heart failure. Risk factors:  Current tobacco use. Hypertension. Dyslipidemia.  ------------------------------------------------------------------- Study Conclusions  - Left ventricle: The cavity size was normal. There was mild   concentric hypertrophy. Systolic function was normal. The   estimated ejection fraction was in the range of 50% to 55%.   Hypokinesis of the basal-midinferolateral and inferior   myocardium. Doppler parameters are consistent with abnormal left   ventricular relaxation (grade 1 diastolic dysfunction). Doppler   parameters are consistent with indeterminate ventricular filling   pressure. - Aortic valve: Transvalvular velocity was within the normal range.   There was no stenosis. There was no regurgitation. Valve area   (VTI): 2.32 cm^2. Valve area (Vmax): 2.66 cm^2. Valve area   (Vmean): 2.66 cm^2. - Mitral valve: Moderately calcified annulus. Transvalvular   velocity was within the normal range. There was no evidence for   stenosis. There was trivial regurgitation. - Left atrium: The atrium was severely dilated. - Right ventricle: The cavity size was normal. Wall thickness was   normal. Systolic function was normal. - Atrial septum: No  defect or patent foramen ovale was identified   by color flow Doppler. - Tricuspid valve: There was no regurgitation. - Pulmonary arteries: Systolic pressure was within the normal   range. PA peak pressure: 9 mm Hg (S).  ------------------------------------------------------------------- Study data:  Comparison was made to the study of 02/22/2002.  Study status:  Routine.  Procedure:  The patient reported no pain  pre or post test. Transthoracic echocardiography. Image quality was adequate.          Transthoracic echocardiography.  M-mode, complete 2D, spectral Doppler, and color Doppler.  Birthdate: Patient birthdate: 1956-10-01.  Age:  Patient is 62 yr old.  Sex: Gender: male.    BMI: 20.6 kg/m^2.  Blood pressure:     128/71 Patient status:  Inpatient.  Study date:  Study date: 08/19/2018. Study time: 01:34 PM.  Location:  Bedside.  -------------------------------------------------------------------  ------------------------------------------------------------------- Left ventricle:  The cavity size was normal. There was mild concentric hypertrophy. Systolic function was normal. The estimated ejection fraction was in the range of 50% to 55%.  Regional wall motion abnormalities:   Hypokinesis of the basal-midinferolateral and inferior myocardium. Doppler parameters are consistent with abnormal left ventricular relaxation (grade 1 diastolic dysfunction). Doppler parameters are consistent with indeterminate ventricular filling pressure.  ------------------------------------------------------------------- Aortic valve:  Poorly visualized.  Trileaflet; normal thickness leaflets. Mobility was not restricted.  Doppler:  Transvalvular velocity was within the normal range. There was no stenosis. There was no regurgitation.    VTI ratio of LVOT to aortic valve: 0.74. Valve area (VTI): 2.32 cm^2. Indexed valve area (VTI): 1.27 cm^2/m^2. Peak velocity ratio of LVOT to aortic valve: 0.85.  Valve area (Vmax): 2.66 cm^2. Indexed valve area (Vmax): 1.46 cm^2/m^2. Mean velocity ratio of LVOT to aortic valve: 0.85. Valve area (Vmean): 2.66 cm^2. Indexed valve area (Vmean): 1.46 cm^2/m^2. Mean gradient (S): 4 mm Hg. Peak gradient (S): 8 mm Hg.  ------------------------------------------------------------------- Aorta:  Aortic root: The aortic root was normal in size.  ------------------------------------------------------------------- Mitral valve:   Moderately calcified annulus. Mobility was not restricted.  Doppler:  Transvalvular velocity was within the normal range. There was no evidence for stenosis. There was trivial regurgitation.    Valve area by pressure half-time: 3.28 cm^2. Indexed valve area by pressure half-time: 1.8 cm^2/m^2.    Peak gradient (D): 4 mm Hg.  ------------------------------------------------------------------- Left atrium:  The atrium was severely dilated.  ------------------------------------------------------------------- Atrial septum:  No defect or patent foramen ovale was identified by color flow Doppler.  ------------------------------------------------------------------- Right ventricle:  The cavity size was normal. Wall thickness was normal. Systolic function was normal.  ------------------------------------------------------------------- Pulmonic valve:    Doppler:  Transvalvular velocity was within the normal range. There was no evidence for stenosis.  ------------------------------------------------------------------- Tricuspid valve:   Structurally normal valve.    Doppler: Transvalvular velocity was within the normal range. There was no regurgitation.  ------------------------------------------------------------------- Pulmonary artery:   The main pulmonary artery was normal-sized. Systolic pressure was within the normal range.  ------------------------------------------------------------------- Right atrium:  The  atrium was normal in size.  ------------------------------------------------------------------- Pericardium:  There was no pericardial effusion.  ------------------------------------------------------------------- Systemic veins: Inferior vena cava: The vessel was normal in size. The respirophasic diameter changes were in the normal range (>= 50%), consistent with normal central venous pressure.  Tonny Bollman, MD (Primary)    Procedures   INTRAVASCULAR PRESSURE WIRE/FFR STUDY  LEFT HEART CATH AND CORONARY ANGIOGRAPHY  Conclusion     Mid LM to Dist LM lesion is 60% stenosed.  Ost LAD to Prox LAD lesion is 30% stenosed.  Prox LAD to Mid LAD lesion is 30% stenosed.  Ost Cx to Prox Cx lesion is 99% stenosed.  Prox RCA to Mid RCA lesion is 95% stenosed.   1.  Moderately severe distal left main stenosis with positive FFR of 0.75 2.  Critical heavily calcified mid RCA stenosis 3.  Critical heavily calcified proximal circumflex stenosis  4.  Nonobstructive LAD stenosis 5.  Mild segmental LV dysfunction with inferolateral hypokinesis and LVEF 50 to 55% by echo assessment 6.  Normal LVEDP    Coronary Findings   Diagnostic  Dominance: Right  Left Main  Mid LM to Dist LM lesion 60% stenosed  Mid LM to Dist LM lesion is 60% stenosed. FFR = 0.75 with IV adenosine. Distal LM stenosis  Left Anterior Descending  Ost LAD to Prox LAD lesion 30% stenosed  Ost LAD to Prox LAD lesion is 30% stenosed.  Prox LAD to Mid LAD lesion 30% stenosed  Prox LAD to Mid LAD lesion is 30% stenosed. The lesion is calcified.  Left Circumflex  Ost Cx to Prox Cx lesion 99% stenosed  Ost Cx to Prox Cx lesion is 99% stenosed. The lesion is severely calcified. The LCx is underfilled because of critical proximal vessel stenosis. The vessel is severely calcified.  Right Coronary Artery  Prox RCA to Mid RCA lesion 95% stenosed  Prox RCA to Mid RCA lesion is 95% stenosed. The lesion is severely  calcified. The distal vessel fills antegrade but the branch vessels fill both antegrade and retrograde from collaterals.  Right Posterior Descending Artery  Collaterals  RPDA filled by collaterals from 1st Sept.    First Right Posterolateral  Collaterals  1st RPLB filled by collaterals from 2nd Sept.    Intervention   No interventions have been documented.  Coronary Diagrams   Diagnostic Diagram           Recent Radiology Findings:    CLINICAL DATA:  Chest pain and shortness of breath for 2 weeks. Nonproductive cough. Smoker.  EXAM: CHEST - 2 VIEW  COMPARISON:  None.  FINDINGS: The cardiomediastinal silhouette is within normal limits. Aortic atherosclerosis is noted. The lungs are mildly hyperinflated with peribronchial thickening and mild diffuse interstitial prominence. There are small bilateral pleural effusions. No overt pulmonary edema, focal airspace consolidation, or pneumothorax is identified. No acute osseous abnormality is seen.  IMPRESSION: Hyperinflation with bronchitic changes and small pleural effusions.   Electronically Signed   By: Sebastian Ache M.D.   On: 08/18/2018 15:54     I have independently reviewed the above radiologic studies and discussed with the patient   Recent Lab Findings: Lab Results  Component Value Date   WBC 9.3 08/21/2018   HGB 10.9 (L) 08/21/2018   HCT 33.0 (L) 08/21/2018   PLT 320 08/21/2018   GLUCOSE 166 (H) 08/21/2018   CHOL 198 08/19/2018   TRIG 71 08/19/2018   HDL 67 08/19/2018   LDLDIRECT 105 09/19/2016   LDLCALC 117 (H) 08/19/2018   ALT 22 08/18/2018   AST 21 08/18/2018   NA 140 08/21/2018   K 4.0 08/21/2018   CL 108 08/21/2018   CREATININE 1.28 (H) 08/21/2018   BUN 22 08/21/2018   CO2 24 08/21/2018   TSH 0.333 (L) 08/18/2018   HGBA1C 9.5 (H) 08/18/2018    Assessment / Plan:   1. Coronary artery disease,probable recent NSTEMI as Troponin I elevated (0.68) on admission. On Heparin drip and  Nitro patch. Will need CABG with Dr. Cornelius Moras on Thursday 08/23/2018. 2. New onset diastolic heart failure-given Lasix upon admission. According to echo, he has a normal LV EF but focal WMA of the basal-midinferolateral and inferior walls, Grade 1 diastolic dysfunction. Hope to start ACE when creatinine normalizes (creatinine 1.28 today). 3. History of hypertension-On Carvedilol 3.125 mg bid and Amlodipine 5 mg daily 4. History of hyperlipidemia-has arthralgias with Simvastatin  in the past. ? consider Crestor in future-will defer to cardiology 5. DM-CBGs 145/190/153. HGA1C 9.5. On Insulin and Metformin prior to admission. He will need close follow up with medical doctor after discharge. 6. Anemia-H and H 10.9 and 33 7. Tobacco abuse-encourage cessation   I  spent 25 minutes counseling the patient face to face.   Doree Fudge PA-C 08/21/2018 11:51 AM   I have seen and examined the patient and agree with the assessment and plan as outlined.  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  He was hospitalized and diagnosed with acute diastolic congestive heart failure and ruled in for a non-ST segment elevation myocardial infarction based on serial cardiac enzymes.  Symptoms have improved rapidly on medical therapy and the patient has not had any episodes of chest pain or chest tightness in the hospital.  I have personally reviewed the patient's transthoracic echocardiogram and diagnostic cardiac catheterization.  Echocardiogram demonstrates mild left ventricular systolic dysfunction with severe hypokinesis  of the basal portion of the inferior wall and lateral wall of the left ventricle.  Ejection fraction is estimated 50 to 55%.  There is no significant valvular disease.  Diagnostic cardiac catheterization performed by Dr. Excell Seltzer demonstrates tubular 60 to 75% stenosis of the left main coronary artery with critical high-grade stenosis involving left circumflex coronary artery in the proximal right coronary artery.  Coronary anatomy is relatively unfavorable for percutaneous coronary intervention, and the left main coronary artery stenosis was proven to be flow-limiting using FFR analysis.  I agree the patient would best be treated with surgical revascularization.    I have reviewed the indications, risks, and potential benefits of coronary artery bypass grafting with the patient and his wife.  Alternative treatment strategies have been discussed, including the relative risks, benefits and long term prognosis associated with medical therapy, percutaneous coronary intervention, and surgical revascularization.  The patient understands and accepts all potential associated risks of surgery including but not limited to risk of death, stroke or other neurologic complication, myocardial infarction, congestive heart failure, respiratory failure, renal failure, bleeding requiring blood transfusion and/or reexploration, aortic dissection or other major vascular complication, arrhythmia, heart block or bradycardia requiring permanent pacemaker, pneumonia, pleural effusion, wound infection, pulmonary embolus or other thromboembolic complication, chronic pain or other delayed complications related to median sternotomy, or the late recurrence of symptomatic ischemic heart disease and/or congestive heart failure.  The importance of long term risk modification have been emphasized.  All questions answered.  We tentatively plan to proceed with coronary artery bypass grafting on Thursday, August 23, 2018   I spent in excess of  90 minutes during the conduct of this hospital encounter and >50% of this time involved direct face-to-face encounter with the patient for counseling and/or coordination of their care.    Purcell Nails, MD 08/21/2018 1:29 PM

## 2018-08-21 NOTE — Progress Notes (Signed)
ANTICOAGULATION CONSULT NOTE  Pharmacy Consult:  Heparin Indication: chest pain/ACS  Allergies  Allergen Reactions  . Amitriptyline Other (See Comments)    Urinary retention  . Carvedilol Diarrhea and Other (See Comments)    GI discomfort and hip pain  . Cyclobenzaprine Other (See Comments)    Made patient "restleness, twitchy"   . Nortriptyline Hcl Other (See Comments)    Vivid / bad dreams  . Simvastatin Other (See Comments)    Arthralgias    Patient Measurements: Height: 5\' 11"  (180.3 cm) Weight: 148 lb 3.2 oz (67.2 kg)( c) IBW/kg (Calculated) : 75.3 Heparin Dosing Weight: 71 kg  Vital Signs: Temp: 98 F (36.7 C) (09/10 0447) Temp Source: Oral (09/10 0447) BP: 116/69 (09/10 0447) Pulse Rate: 64 (09/10 0447)  Labs: Recent Labs    08/18/18 1707  08/18/18 2305 08/19/18 0603 08/19/18 1251 08/20/18 0621 08/20/18 0843 08/20/18 1406 08/21/18 0756  HGB  --   --   --  11.1*  --  10.4* 10.6*  --  10.9*  HCT  --   --   --  34.3*  --  32.1* 32.3*  --  33.0*  PLT  --   --   --  345  --  312 253  --  320  HEPARINUNFRC  --    < > 0.17* 0.50 0.37 0.35  --   --  0.29*  CREATININE  --   --   --  1.42*  --  1.43* 1.45* 1.38*  --   TROPONINI 0.32*  --  0.44* 0.68*  --   --   --   --   --    < > = values in this interval not displayed.    Estimated Creatinine Clearance: 51.4 mL/min (A) (by C-G formula based on SCr of 1.38 mg/dL (H)).   Medical History: Past Medical History:  Diagnosis Date  . Diabetes mellitus   . GERD (gastroesophageal reflux disease)   . Hyperlipidemia   . Hypertension   . Tobacco abuse      Assessment: 53 YOM presented with chest pain and SOB.  Troponin elevated and Pharmacy consulted to begin IV heparin for ACS.  Cath showed stenosis in L main, mid RCA, and proximal Cx - now awaiting surgery evaluation. -heparin level = 0.29, CBC stable  Goal of Therapy:  Heparin level 0.3-0.7 units/ml Monitor platelets by anticoagulation protocol: Yes    Plan:  -Increase heparin to 11150 units/hr -Daily heparin level and CBC  Harland German, PharmD Clinical Pharmacist Please check Amion for pharmacy contact number

## 2018-08-21 NOTE — Progress Notes (Signed)
Patient no longer needs Q4 CBG'S. MD was paged and gave verbal orders to d/c the Q4 CBG's and to place an order for ACHS.  Elsie Lincoln, RN

## 2018-08-21 NOTE — Progress Notes (Signed)
Pre-op Cardiac Surgery  Carotid Findings:   Bilateral ICAs 40-59% stenosis based on the category of velocity. Bilateral vertebral arteries patent with antegrade flow.    Upper Extremity Right Left  Brachial Pressures 115 118  Radial Waveforms Triphasic Biphasic  Ulnar Waveforms Triphasic Triphasic  Palmar Arch (Allen's Test) See below See below    Lower  Extremity Right Left  Dorsalis Pedis Triphasic Biphasic  Posterior Tibial Biphasic Triphasic  Ankle/Brachial Indices 1.03 0.95    Findings:   Right ABI: Resting right ankle-brachial index is within normal range. No evidence of significant right lower extremity arterial disease.  Left ABI: Resting left ankle-brachial index is within normal range. No evidence of significant left lower extremity arterial disease.  Right Upper Extremity: Doppler waveforms remain within normal limits with right radial compression. Doppler waveform obliterate with right ulnar compression.  Left Upper Extremity: Doppler waveforms remain within normal limits with left radial compression. Doppler waveforms decrease <50% with left ulnar compression.   Colin Keller (RDMS RVT) 08/21/18 3:31 PM

## 2018-08-21 NOTE — Progress Notes (Signed)
Inpatient Diabetes Program Recommendations  AACE/ADA: New Consensus Statement on Inpatient Glycemic Control (2015)  Target Ranges:  Prepandial:   less than 140 mg/dL      Peak postprandial:   less than 180 mg/dL (1-2 hours)      Critically ill patients:  140 - 180 mg/dL   Lab Results  Component Value Date   GLUCAP 153 (H) 08/21/2018   HGBA1C 9.5 (H) 08/18/2018  Results for REASE, ROWLISON (MRN [id number]) as of 08/21/2018 11:40  Ref. Range 08/20/2018 18:36 08/20/2018 20:23 08/21/2018 00:04 08/21/2018 04:43 08/21/2018 07:59  Glucose-Capillary Latest Ref Range: 70 - 99 mg/dL 073 (H) 710 (H) 626 (H) 190 (H) 153 (H)   Diabetes history: DM (Type 2 Per problem list) Outpatient Diabetes medications:  Tresiba 12 units daily, Novolog 3 units breakfast, Novolog 3 units lunch, and Novolog 7 units with supper Current orders for Inpatient glycemic control:  Novolog sensitive q 4 hours, Lantus 12 units daily Inpatient Diabetes Program Recommendations:   Blood sugars improved with addition of Lantus 12 units.  Patient is now on PO diet.  Please consider changing Novolog correction to tid with meals and HS.  Also consider adding Novolog 3 units tid with meals (hold if patient eats less than 50%).    Thanks,  Beryl Meager, RN, BC-ADM Inpatient Diabetes Coordinator Pager [phone] (8a-5p)

## 2018-08-22 LAB — BLOOD GAS, ARTERIAL
ACID-BASE DEFICIT: 0.5 mmol/L (ref 0.0–2.0)
BICARBONATE: 23.8 mmol/L (ref 20.0–28.0)
O2 Saturation: 94 %
PATIENT TEMPERATURE: 98.6
pCO2 arterial: 40.3 mmHg (ref 32.0–48.0)
pH, Arterial: 7.388 (ref 7.350–7.450)
pO2, Arterial: 70.6 mmHg — ABNORMAL LOW (ref 83.0–108.0)

## 2018-08-22 LAB — GLUCOSE, CAPILLARY
GLUCOSE-CAPILLARY: 315 mg/dL — AB (ref 70–99)
GLUCOSE-CAPILLARY: 400 mg/dL — AB (ref 70–99)
Glucose-Capillary: 384 mg/dL — ABNORMAL HIGH (ref 70–99)
Glucose-Capillary: 447 mg/dL — ABNORMAL HIGH (ref 70–99)
Glucose-Capillary: 126 mg/dL — ABNORMAL HIGH (ref 70–99)

## 2018-08-22 LAB — CBC
HEMATOCRIT: 29.5 % — AB (ref 39.0–52.0)
HEMOGLOBIN: 9.6 g/dL — AB (ref 13.0–17.0)
MCH: 31.8 pg (ref 26.0–34.0)
MCHC: 32.5 g/dL (ref 30.0–36.0)
MCV: 97.7 fL (ref 78.0–100.0)
Platelets: 276 10*3/uL (ref 150–400)
RBC: 3.02 MIL/uL — ABNORMAL LOW (ref 4.22–5.81)
RDW: 12.8 % (ref 11.5–15.5)
WBC: 8.8 10*3/uL (ref 4.0–10.5)

## 2018-08-22 LAB — COMPREHENSIVE METABOLIC PANEL
ALT: 16 U/L (ref 0–44)
AST: 20 U/L (ref 15–41)
Albumin: 3.1 g/dL — ABNORMAL LOW (ref 3.5–5.0)
Alkaline Phosphatase: 61 U/L (ref 38–126)
Anion gap: 12 (ref 5–15)
BILIRUBIN TOTAL: 0.6 mg/dL (ref 0.3–1.2)
BUN: 23 mg/dL (ref 8–23)
CALCIUM: 8.6 mg/dL — AB (ref 8.9–10.3)
CHLORIDE: 103 mmol/L (ref 98–111)
CO2: 22 mmol/L (ref 22–32)
Creatinine, Ser: 1.29 mg/dL — ABNORMAL HIGH (ref 0.61–1.24)
GFR, EST NON AFRICAN AMERICAN: 57 mL/min — AB (ref >60)
Glucose, Bld: 265 mg/dL — ABNORMAL HIGH (ref 70–99)
POTASSIUM: 4.1 mmol/L (ref 3.5–5.1)
Sodium: 137 mmol/L (ref 135–145)
TOTAL PROTEIN: 5.5 g/dL — AB (ref 6.5–8.1)

## 2018-08-22 LAB — SURGICAL PCR SCREEN
MRSA, PCR: NEGATIVE
Staphylococcus aureus: NEGATIVE

## 2018-08-22 LAB — PREALBUMIN: Prealbumin: 17.7 mg/dL — ABNORMAL LOW (ref 18–38)

## 2018-08-22 LAB — HEPARIN LEVEL (UNFRACTIONATED): Heparin Unfractionated: 0.29 IU/mL — ABNORMAL LOW (ref 0.30–0.70)

## 2018-08-22 LAB — ABO/RH: ABO/RH(D): A POS

## 2018-08-22 MED ORDER — METOPROLOL TARTRATE 12.5 MG HALF TABLET
12.5000 mg | ORAL_TABLET | Freq: Once | ORAL | Status: AC
  Administered 2018-08-23: 12.5 mg via ORAL
  Filled 2018-08-22: qty 1

## 2018-08-22 MED ORDER — VANCOMYCIN HCL 1000 MG IV SOLR
INTRAVENOUS | Status: AC
  Administered 2018-08-23: 1000 mL
  Filled 2018-08-22: qty 1000

## 2018-08-22 MED ORDER — EPINEPHRINE PF 1 MG/ML IJ SOLN
0.0000 ug/min | INTRAVENOUS | Status: DC
  Filled 2018-08-22: qty 4

## 2018-08-22 MED ORDER — TRANEXAMIC ACID (OHS) BOLUS VIA INFUSION
15.0000 mg/kg | INTRAVENOUS | Status: AC
  Administered 2018-08-23: 1008 mg via INTRAVENOUS
  Filled 2018-08-22: qty 1008

## 2018-08-22 MED ORDER — DOPAMINE-DEXTROSE 3.2-5 MG/ML-% IV SOLN
0.0000 ug/kg/min | INTRAVENOUS | Status: DC
  Filled 2018-08-22: qty 250

## 2018-08-22 MED ORDER — CHLORHEXIDINE GLUCONATE 4 % EX LIQD
60.0000 mL | Freq: Once | CUTANEOUS | Status: AC
  Administered 2018-08-23: 4 via TOPICAL
  Filled 2018-08-22: qty 60

## 2018-08-22 MED ORDER — MAGNESIUM SULFATE 50 % IJ SOLN
40.0000 meq | INTRAMUSCULAR | Status: DC
  Filled 2018-08-22: qty 9.85

## 2018-08-22 MED ORDER — DEXMEDETOMIDINE HCL IN NACL 400 MCG/100ML IV SOLN
0.1000 ug/kg/h | INTRAVENOUS | Status: DC
  Filled 2018-08-22: qty 100

## 2018-08-22 MED ORDER — NITROGLYCERIN IN D5W 200-5 MCG/ML-% IV SOLN
2.0000 ug/min | INTRAVENOUS | Status: DC
  Filled 2018-08-22: qty 250

## 2018-08-22 MED ORDER — CHLORHEXIDINE GLUCONATE 4 % EX LIQD
60.0000 mL | Freq: Once | CUTANEOUS | Status: AC
  Administered 2018-08-22: 4 via TOPICAL
  Filled 2018-08-22: qty 60

## 2018-08-22 MED ORDER — SODIUM CHLORIDE 0.9 % IV SOLN
INTRAVENOUS | Status: DC
  Filled 2018-08-22: qty 30

## 2018-08-22 MED ORDER — PLASMA-LYTE 148 IV SOLN
INTRAVENOUS | Status: AC
  Administered 2018-08-23: 500 mL
  Filled 2018-08-22: qty 2.5

## 2018-08-22 MED ORDER — POTASSIUM CHLORIDE 2 MEQ/ML IV SOLN
80.0000 meq | INTRAVENOUS | Status: DC
  Filled 2018-08-22: qty 40

## 2018-08-22 MED ORDER — MILRINONE LACTATE IN DEXTROSE 20-5 MG/100ML-% IV SOLN
0.3000 ug/kg/min | INTRAVENOUS | Status: DC
  Filled 2018-08-22: qty 100

## 2018-08-22 MED ORDER — SODIUM CHLORIDE 0.9 % IV SOLN
1.5000 g | INTRAVENOUS | Status: AC
  Administered 2018-08-23: 1.5 g via INTRAVENOUS
  Administered 2018-08-23: .75 g via INTRAVENOUS
  Filled 2018-08-22: qty 1.5

## 2018-08-22 MED ORDER — SODIUM CHLORIDE 0.9 % IV SOLN
750.0000 mg | INTRAVENOUS | Status: DC
  Filled 2018-08-22: qty 750

## 2018-08-22 MED ORDER — SODIUM CHLORIDE 0.9 % IV SOLN
INTRAVENOUS | Status: DC
  Filled 2018-08-22: qty 1

## 2018-08-22 MED ORDER — TEMAZEPAM 15 MG PO CAPS: 15.0000 mg | ORAL_CAPSULE | Freq: Once | ORAL | Status: DC | PRN

## 2018-08-22 MED ORDER — TRANEXAMIC ACID (OHS) PUMP PRIME SOLUTION
2.0000 mg/kg | INTRAVENOUS | Status: DC
  Filled 2018-08-22: qty 1.34

## 2018-08-22 MED ORDER — TRANEXAMIC ACID 1000 MG/10ML IV SOLN
1.5000 mg/kg/h | INTRAVENOUS | Status: DC
  Filled 2018-08-22: qty 25

## 2018-08-22 MED ORDER — CHLORHEXIDINE GLUCONATE 0.12 % MT SOLN
15.0000 mL | Freq: Once | OROMUCOSAL | Status: AC
  Administered 2018-08-23: 15 mL via OROMUCOSAL
  Filled 2018-08-22: qty 15

## 2018-08-22 MED ORDER — SODIUM CHLORIDE 0.9 % IV SOLN
30.0000 ug/min | INTRAVENOUS | Status: DC
  Filled 2018-08-22: qty 2

## 2018-08-22 MED ORDER — MILRINONE LACTATE IN DEXTROSE 20-5 MG/100ML-% IV SOLN
0.1250 ug/kg/min | INTRAVENOUS | Status: DC
  Filled 2018-08-22: qty 100

## 2018-08-22 MED ORDER — BISACODYL 5 MG PO TBEC
5.0000 mg | DELAYED_RELEASE_TABLET | Freq: Once | ORAL | Status: AC
  Administered 2018-08-22: 5 mg via ORAL
  Filled 2018-08-22: qty 1

## 2018-08-22 MED ORDER — VANCOMYCIN HCL 10 G IV SOLR
1250.0000 mg | INTRAVENOUS | Status: AC
  Administered 2018-08-23: 1250 mg via INTRAVENOUS
  Filled 2018-08-22: qty 1250

## 2018-08-22 NOTE — Progress Notes (Signed)
ANTICOAGULATION CONSULT NOTE  Pharmacy Consult:  Heparin Indication: chest pain/ACS  Allergies  Allergen Reactions  . Amitriptyline Other (See Comments)    Urinary retention  . Carvedilol Diarrhea and Other (See Comments)    GI discomfort and hip pain  . Cyclobenzaprine Other (See Comments)    Made patient "restleness, twitchy"   . Nortriptyline Hcl Other (See Comments)    Vivid / bad dreams  . Simvastatin Other (See Comments)    Arthralgias    Patient Measurements: Height: 5\' 11"  (180.3 cm) Weight: (P) 151 lb 3.2 oz (68.6 kg)( c) IBW/kg (Calculated) : 75.3 Heparin Dosing Weight: 71 kg  Vital Signs: Temp: 98 F (36.7 C) (09/11 0504) Temp Source: Oral (09/11 0504) BP: 124/71 (09/11 0504) Pulse Rate: 68 (09/11 0504)  Labs: Recent Labs    08/20/18 0621 08/20/18 0843 08/20/18 1406 08/21/18 0756 08/22/18 0433  HGB 10.4* 10.6*  --  10.9* 9.6*  HCT 32.1*  32.4* 32.3*  --  33.0* 29.5*  PLT 312 253  --  320 276  HEPARINUNFRC 0.35  --   --  0.29* 0.29*  CREATININE 1.43* 1.45* 1.38* 1.28* 1.29*    Estimated Creatinine Clearance: 55 mL/min (A) (by C-G formula based on SCr of 1.29 mg/dL (H)).   Medical History: Past Medical History:  Diagnosis Date  . Diabetes mellitus   . GERD (gastroesophageal reflux disease)   . Hyperlipidemia   . Hypertension   . Tobacco abuse   . Tobacco abuse disorder    Qualifier: Diagnosis of  By: Lafonda Mosses MD, Darl Pikes       Assessment: 62 YOM presented with chest pain and SOB.  Troponin elevated and Pharmacy consulted to begin IV heparin for ACS.  Cath showed stenosis in L main, mid RCA, and proximal Cx. CABG planned 9/12 -heparin level = 0.29, CBC stable  Goal of Therapy:  Heparin level 0.3-0.7 units/ml Monitor platelets by anticoagulation protocol: Yes   Plan:  -Increase heparin to 1150 units/hr -heparin to be off at midnight -Daily heparin level and CBC  Harland German, PharmD Clinical Pharmacist Please check Amion for  pharmacy contact number

## 2018-08-22 NOTE — Progress Notes (Addendum)
Inpatient Diabetes Program Recommendations  AACE/ADA: New Consensus Statement on Inpatient Glycemic Control (2015)  Target Ranges:  Prepandial:   less than 140 mg/dL      Peak postprandial:   less than 180 mg/dL (1-2 hours)      Critically ill patients:  140 - 180 mg/dL   Lab Results  Component Value Date   GLUCAP 384 (H) 08/22/2018   HGBA1C 9.5 (H) 08/18/2018    Review of Glycemic ControlResults for Colin Keller, Colin Keller (MRN [id number]) as of 08/22/2018 12:54  Ref. Range 08/21/2018 11:41 08/21/2018 16:44 08/21/2018 20:27 08/22/2018 07:48 08/22/2018 11:21  Glucose-Capillary Latest Ref Range: 70 - 99 mg/dL 625 (H) 638 (H) 937 (H) 315 (H) 384 (H)   Diabetes history:DM (Type 2 Per problem list) Outpatient Diabetes medications: Tresiba 12 units daily, Novolog 3 units breakfast, Novolog 3 units lunch, and Novolog 7 units with supper Current orders for Inpatient glycemic control: Novolog sensitive tid with meals and HS, Lantus 12 units daily Inpatient Diabetes Program Recommendations:    Please increase Lantus to 15 units daily.  Also consider adding Novolog meal coverage 3 units tid with meals (hold if patient eats less than 50%). Text page sent.   Thanks,  Beryl Meager, RN, BC-ADM Inpatient Diabetes Coordinator Pager [phone] (8a-5p)

## 2018-08-22 NOTE — Progress Notes (Signed)
      [address]       [name] [address]             [phone]     CARDIOTHORACIC SURGERY PROGRESS NOTE  2 Days Post-Op  S/P Procedure(s) (LRB): LEFT HEART CATH AND CORONARY ANGIOGRAPHY (N/A) INTRAVASCULAR PRESSURE WIRE/FFR STUDY (N/A)  Subjective: No complaints.  No chest pain or SOB  Objective: Vital signs in last 24 hours: Temp:  [98 F (36.7 C)-98.8 F (37.1 C)] 98 F (36.7 C) (09/11 0504) Pulse Rate:  [68-73] 68 (09/11 0504) Cardiac Rhythm: Normal sinus rhythm (09/11 0800) Resp:  [16-17] 16 (09/11 0504) BP: (124-156)/(71-83) 124/71 (09/11 0504) SpO2:  [97 %-100 %] 97 % (09/11 0504) Weight:  [68.6 kg] 68.6 kg (09/11 0504)  Physical Exam:  Rhythm:   sinus  Breath sounds: clear  Heart sounds:  RRR  Incisions:  n/a  Abdomen:  soft  Extremities:  warm   Intake/Output from previous day: 09/10 0701 - 09/11 0700 In: 483 [P.O.:480; I.V.:3] Out: 3 [Urine:3] Intake/Output this shift: Total I/O In: 240 [P.O.:240] Out: 600 [Urine:600]  Lab Results: Recent Labs    08/21/18 0756 08/22/18 0433  WBC 9.3 8.8  HGB 10.9* 9.6*  HCT 33.0* 29.5*  PLT 320 276   BMET:  Recent Labs    08/21/18 0756 08/22/18 0433  NA 140 137  K 4.0 4.1  CL 108 103  CO2 24 22  GLUCOSE 166* 265*  BUN 22 23  CREATININE 1.28* 1.29*  CALCIUM 9.1 8.6*    CBG (last 3)  Recent Labs    08/21/18 1644 08/21/18 2027 08/22/18 0748  GLUCAP 341* 275* 315*   PT/INR:  No results for input(s): LABPROT, INR in the last 72 hours.  CXR:  CHEST - 2 VIEW  COMPARISON:  None.  FINDINGS: The cardiomediastinal silhouette is within normal limits. Aortic atherosclerosis is noted. The lungs are mildly hyperinflated with peribronchial thickening and mild diffuse interstitial prominence. There are small bilateral pleural effusions. No overt pulmonary edema, focal airspace consolidation, or pneumothorax is identified. No acute osseous abnormality is  seen.  IMPRESSION: Hyperinflation with bronchitic changes and small pleural effusions.   Electronically Signed   By: Sebastian Ache M.D.   On: 08/18/2018 15:54   Assessment/Plan: S/P Procedure(s) (LRB): LEFT HEART CATH AND CORONARY ANGIOGRAPHY (N/A) INTRAVASCULAR PRESSURE WIRE/FFR STUDY (N/A)  For CABG tomorrow morning.  All questions answered.  Purcell Nails, MD 08/22/2018 9:34 AM

## 2018-08-22 NOTE — Progress Notes (Signed)
Progress Note  Patient Name: Colin Keller Date of Encounter: 08/22/2018  Primary Cardiologist: Olga Millers, MD   Subjective   Doing well, awaiting CABG tomorrow. Many questions, mostly related to surgical details, but answered to the best of my ability.  Inpatient Medications    Scheduled Meds: . amLODipine  5 mg Oral Daily  . aspirin EC  81 mg Oral Daily  . carvedilol  3.125 mg Oral BID WC  . famotidine  20 mg Oral Daily  . [START ON 08/23/2018] heparin-papaverine-plasmalyte irrigation   Irrigation To OR  . insulin aspart  0-9 Units Subcutaneous Q4H  . insulin glargine  12 Units Subcutaneous Daily  . [START ON 08/23/2018] magnesium sulfate  40 mEq Other To OR  . nitroGLYCERIN  0.5 inch Topical Q6H  . [START ON 08/23/2018] potassium chloride  80 mEq Other To OR  . pravastatin  20 mg Oral Once per day on Mon Wed Fri  . sodium chloride flush  3 mL Intravenous Q12H  . [START ON 08/23/2018] tranexamic acid  15 mg/kg Intravenous To OR  . [START ON 08/23/2018] tranexamic acid  2 mg/kg Intracatheter To OR  . [START ON 08/23/2018] vancomycin 1000 mg in NS (1000 ml) irrigation for Dr. Cornelius Moras case   Irrigation To OR   Continuous Infusions: . sodium chloride 10 mL/hr at 08/19/18 0624  . sodium chloride    . [START ON 08/23/2018] cefUROXime (ZINACEF)  IV    . [START ON 08/23/2018] cefUROXime (ZINACEF)  IV    . [START ON 08/23/2018] dexmedetomidine    . [START ON 08/23/2018] DOPamine    . [START ON 08/23/2018] epinephrine    . [START ON 08/23/2018] heparin 30,000 units/NS 1000 mL solution for CELLSAVER    . heparin 1,150 Units/hr (08/22/18 0844)  . [START ON 08/23/2018] insulin (NOVOLIN-R) infusion    . [START ON 08/23/2018] milrinone    . [START ON 08/23/2018] milrinone    . [START ON 08/23/2018] nitroGLYCERIN    . [START ON 08/23/2018] phenylephrine (NEO-SYNEPHRINE) Adult infusion    . [START ON 08/23/2018] tranexamic acid (CYKLOKAPRON) infusion ([facility])    . [START ON 08/23/2018] vancomycin      PRN Meds: sodium chloride, acetaminophen, ALPRAZolam, HYDROcodone-acetaminophen, nitroGLYCERIN, ondansetron (ZOFRAN) IV, sodium chloride flush   Vital Signs    Vitals:   08/21/18 1315 08/21/18 1951 08/22/18 0504 08/22/18 1252  BP: (!) 154/83 (!) 156/81 124/71 135/81  Pulse: 69 73 68 67  Resp: 17 16 16 15   Temp: 98.8 F (37.1 C) 98.2 F (36.8 C) 98 F (36.7 C) 98.3 F (36.8 C)  TempSrc: Oral Oral Oral Oral  SpO2: 100% 100% 97% 99%  Weight:   68.6 kg   Height:        Intake/Output Summary (Last 24 hours) at 08/22/2018 1450 Last data filed at 08/22/2018 0850 Gross per 24 hour  Intake 483 ml  Output 603 ml  Net -120 ml   Filed Weights   08/20/18 0021 08/21/18 0447 08/22/18 0504  Weight: 67.9 kg 67.2 kg 68.6 kg    Telemetry    Sinus - Personally Reviewed  Physical Exam   GEN: No acute distress.   Neck: No JVD. L carotid bruit. Cardiac: RRR, no murmurs, rubs, or gallops. 2+ bilateral radial and DP pulses Respiratory: Clear to auscultation bilaterally. GI: Soft, nontender, non-distended  MS: No edema Neuro:  Nonfocal  Psych: Normal affect   Labs    Chemistry Recent Labs  Lab 08/18/18 1707  08/20/18 1406  08/21/18 0756 08/22/18 0433  NA  --    < > 139 140 137  K  --    < > 4.0 4.0 4.1  CL  --    < > 104 108 103  CO2  --    < > 22 24 22   GLUCOSE  --    < > 293* 166* 265*  BUN  --    < > 28* 22 23  CREATININE  --    < > 1.38* 1.28* 1.29*  CALCIUM  --    < > 9.3 9.1 8.6*  PROT 6.5  --   --   --  5.5*  ALBUMIN 3.5  --   --   --  3.1*  AST 21  --   --   --  20  ALT 22  --   --   --  16  ALKPHOS 75  --   --   --  61  BILITOT 1.0  --   --   --  0.6  GFRNONAA  --    < > 53* 58* 57*  GFRAA  --    < > >60 >60 >60  ANIONGAP  --    < > 13 8 12    < > = values in this interval not displayed.     Hematology Recent Labs  Lab 08/20/18 0843 08/21/18 0756 08/22/18 0433  WBC 8.7 9.3 8.8  RBC 3.32* 3.37* 3.02*  HGB 10.6* 10.9* 9.6*  HCT 32.3* 33.0* 29.5*   MCV 97.3 97.9 97.7  MCH 31.9 32.3 31.8  MCHC 32.8 33.0 32.5  RDW 13.0 12.9 12.8  PLT 253 320 276    Cardiac Enzymes Recent Labs  Lab 08/18/18 1401 08/18/18 1707 08/18/18 2305 08/19/18 0603  TROPONINI 0.38* 0.32* 0.44* 0.68*    Recent Labs  Lab 08/18/18 1409  TROPIPOC 0.29*     BNP Recent Labs  Lab 08/18/18 1401  BNP 1,083.3*     Radiology    No results found. CV studies: Echo 08/19/18 Study Conclusions  - Left ventricle: The cavity size was normal. There was mild   concentric hypertrophy. Systolic function was normal. The   estimated ejection fraction was in the range of 50% to 55%.   Hypokinesis of the basal-midinferolateral and inferior   myocardium. Doppler parameters are consistent with abnormal left   ventricular relaxation (grade 1 diastolic dysfunction). Doppler   parameters are consistent with indeterminate ventricular filling   pressure. - Aortic valve: Transvalvular velocity was within the normal range.   There was no stenosis. There was no regurgitation. Valve area   (VTI): 2.32 cm^2. Valve area (Vmax): 2.66 cm^2. Valve area   (Vmean): 2.66 cm^2. - Mitral valve: Moderately calcified annulus. Transvalvular   velocity was within the normal range. There was no evidence for   stenosis. There was trivial regurgitation. - Left atrium: The atrium was severely dilated. - Right ventricle: The cavity size was normal. Wall thickness was   normal. Systolic function was normal. - Atrial septum: No defect or patent foramen ovale was identified   by color flow Doppler. - Tricuspid valve: There was no regurgitation. - Pulmonary arteries: Systolic pressure was within the normal   range. PA peak pressure: 9 mm Hg (S).  Cath 08/20/18  Mid LM to Dist LM lesion is 60% stenosed.  Ost LAD to Prox LAD lesion is 30% stenosed.  Prox LAD to Mid LAD lesion is 30% stenosed.  Ost Cx to Prox Cx lesion  is 99% stenosed.  Prox RCA to Mid RCA lesion is 95% stenosed.    1.  Moderately severe distal left main stenosis with positive FFR of 0.75 2.  Critical heavily calcified mid RCA stenosis 3.  Critical heavily calcified proximal circumflex stenosis 4.  Nonobstructive LAD stenosis 5.  Mild segmental LV dysfunction with inferolateral hypokinesis and LVEF 50 to 55% by echo assessment 6.  Normal LVEDP  Recommendations: Surgical evaluation for consideration of CABG.  The patient will be restarted on heparin after his sheath is out.  Case discussed with Dr. Tyrone Sage. Pt stable/chest pain free on heparin.   Patient Profile     62 year old male with past medical history of diabetes mellitus, hypertension, hyperlipidemia, tobacco abuse with  non-ST elevation myocardial infarction and acute diastolic heart failure.    Assessment & Plan    1 new onset diastolic heart failure- -normal LV EF on echo but focal WMA of the basal-midinferolateral and inferior walls. Grade 1 diastolic dysfunction. -appears euvolemic today, received one day of diuresis on admission -holding ACEI in anticipation of cath, given possible plans for CABG and good BP control will continue to hold -on carvedilol  2 NSTEMI: pending CABG 1st case 08/23/18 -Continue aspirin, heparin and carvedilol.  Continue low-dose pravastatin (patient did not tolerate high-dose statins in the past). -post CABG, should be on DAPT for 12 months given NSTEMI presentation -consider repatha/follow up lipid clinic  3 chronic stage III kidney disease -holding ACE inhibitor and further diuresis -not hydrated prior to cath given acute diastolic heart failure on admission  4 tobacco abuse -patient counseled on discontinuing. He is amenable.  5 hyperlipidemia -continue pravastatin; he did not tolerate high-dose statins in the past.  Will likely follow-up in lipid clinic for consideration of Repatha.  6 diabetes mellitus -Glucophage on hold.  Hemoglobin A1c elevated.  Would arrange endocrinology evaluation  following discharge.  7 hypertension -holding ACE inhibitor -Continue amlodipine and carvedilol  8 left carotid bruit-Dopplers done, pending read  9 anemia-question contribution from renal insufficiency.  MCV is elevated.    B12 (normal) and folate (normal).  For questions or updates, please contact [facility] Please consult [email or web address] for contact info under     Signed, Jodelle Red, MD  08/22/2018, 2:50 PM

## 2018-08-22 NOTE — Care Management Note (Signed)
Case Management Note  Patient Details  Name: Colin Keller MRN: [id number] Date of Birth: 1956-08-29  Subjective/Objective: NSTEMI                  Action/Plan: Patient lives at home with spouse; Primary Care Provider: [name], DO; no medical insurance, Financial Counselor has seen patient; he if for CABG tomorrow; CM will continue to follow for progression of care.   Expected Discharge Date:    TBD              Status of Service:   In progress  Reola Mosher [phone] 08/22/2018, 9:53 AM

## 2018-08-22 NOTE — Progress Notes (Signed)
Inpatient Diabetes Program Recommendations  AACE/ADA: New Consensus Statement on Inpatient Glycemic Control (2015)  Target Ranges:  Prepandial:   less than 140 mg/dL      Peak postprandial:   less than 180 mg/dL (1-2 hours)      Critically ill patients:  140 - 180 mg/dL   Lab Results  Component Value Date   GLUCAP 384 (H) 08/22/2018   HGBA1C 9.5 (H) 08/18/2018    Review of Glycemic ControlResults for SHERROD, HATCHELL (MRN [id number]) as of 08/22/2018 12:54  Ref. Range 08/21/2018 11:41 08/21/2018 16:44 08/21/2018 20:27 08/22/2018 07:48 08/22/2018 11:21  Glucose-Capillary Latest Ref Range: 70 - 99 mg/dL 270 (H) 786 (H) 754 (H) 315 (H) 384 (H)   Diabetes history:DM (Type 2 Per problem list) Outpatient Diabetes medications: Tresiba 12 units daily, Novolog 3 units breakfast, Novolog 3 units lunch, and Novolog 7 units with supper Current orders for Inpatient glycemic control: Novolog sensitive tid with meals and HS, Lantus 12 units daily Inpatient Diabetes Program Recommendations:    Please increase Lantus to 15 units daily.  Also consider adding Novolog meal coverage 3 units tid with meals (hold if patient eats less than 50%).  Thanks,  Beryl Meager, RN, BC-ADM Inpatient Diabetes Coordinator Pager [phone] (8a-5p)

## 2018-08-22 NOTE — Progress Notes (Signed)
[id number] Gave pt [facility] booklet and care guide. Pt had IS and demonstrated 2500 ml correctly with cues. Pt does not want to watch pre op video. Pt stated his wife and son will be able to provide 24 hour care after surgery. Pt plans to quit smoking cold [place]. Did not want fake cigarette. Discussed importance of mobility and IS after surgery. Discussed keeping arms close to body and sternal precautions. Will follow up after surgery .Luetta Nutting RN BSN 08/22/2018 11:25 AM

## 2018-08-23 ENCOUNTER — Encounter (HOSPITAL_COMMUNITY)
Admission: EM | Disposition: A | Discharge status: Home / Self Care | Payer: Self-pay | Source: Home / Self Care | Attending: Thoracic Surgery (Cardiothoracic Vascular Surgery)

## 2018-08-23 ENCOUNTER — Inpatient Hospital Stay (HOSPITAL_COMMUNITY): Payer: Self-pay | Admitting: Certified Registered"

## 2018-08-23 ENCOUNTER — Encounter (HOSPITAL_COMMUNITY): Payer: Self-pay | Admitting: Certified Registered"

## 2018-08-23 ENCOUNTER — Inpatient Hospital Stay (HOSPITAL_COMMUNITY): Payer: Self-pay

## 2018-08-23 DIAGNOSIS — Z951 Presence of aortocoronary bypass graft: Secondary | ICD-10-CM

## 2018-08-23 HISTORY — DX: Presence of aortocoronary bypass graft: Z95.1

## 2018-08-23 HISTORY — PX: TEE WITHOUT CARDIOVERSION: SHX5443

## 2018-08-23 HISTORY — PX: CORONARY ARTERY BYPASS GRAFT: SHX141

## 2018-08-23 LAB — POCT I-STAT, CHEM 8
BUN: 23 mg/dL (ref 8–23)
BUN: 21 mg/dL (ref 8–23)
BUN: 23 mg/dL (ref 8–23)
BUN: 22 mg/dL (ref 8–23)
BUN: 22 mg/dL (ref 8–23)
BUN: 21 mg/dL (ref 8–23)
CALCIUM ION: 1.27 mmol/L (ref 1.15–1.40)
CALCIUM ION: 1.17 mmol/L (ref 1.15–1.40)
CHLORIDE: 107 mmol/L (ref 98–111)
CREATININE: 0.8 mg/dL (ref 0.61–1.24)
CREATININE: 0.9 mg/dL (ref 0.61–1.24)
Calcium, Ion: 1.14 mmol/L — ABNORMAL LOW (ref 1.15–1.40)
Calcium, Ion: 1.28 mmol/L (ref 1.15–1.40)
Calcium, Ion: 1.21 mmol/L (ref 1.15–1.40)
Calcium, Ion: 1.17 mmol/L (ref 1.15–1.40)
Chloride: 105 mmol/L (ref 98–111)
Chloride: 104 mmol/L (ref 98–111)
Chloride: 106 mmol/L (ref 98–111)
Chloride: 106 mmol/L (ref 98–111)
Chloride: 110 mmol/L (ref 98–111)
Creatinine, Ser: 0.9 mg/dL (ref 0.61–1.24)
Creatinine, Ser: 1 mg/dL (ref 0.61–1.24)
Creatinine, Ser: 0.9 mg/dL (ref 0.61–1.24)
Creatinine, Ser: 1.2 mg/dL (ref 0.61–1.24)
GLUCOSE: 232 mg/dL — AB (ref 70–99)
GLUCOSE: 211 mg/dL — AB (ref 70–99)
GLUCOSE: 179 mg/dL — AB (ref 70–99)
Glucose, Bld: 290 mg/dL — ABNORMAL HIGH (ref 70–99)
Glucose, Bld: 265 mg/dL — ABNORMAL HIGH (ref 70–99)
Glucose, Bld: 121 mg/dL — ABNORMAL HIGH (ref 70–99)
HCT: 26 % — ABNORMAL LOW (ref 39.0–52.0)
HCT: 22 % — ABNORMAL LOW (ref 39.0–52.0)
HEMATOCRIT: 25 % — AB (ref 39.0–52.0)
HEMATOCRIT: 25 % — AB (ref 39.0–52.0)
HEMATOCRIT: 24 % — AB (ref 39.0–52.0)
HEMATOCRIT: 27 % — AB (ref 39.0–52.0)
HEMOGLOBIN: 8.5 g/dL — AB (ref 13.0–17.0)
HEMOGLOBIN: 8.2 g/dL — AB (ref 13.0–17.0)
HEMOGLOBIN: 9.2 g/dL — AB (ref 13.0–17.0)
Hemoglobin: 8.8 g/dL — ABNORMAL LOW (ref 13.0–17.0)
Hemoglobin: 8.5 g/dL — ABNORMAL LOW (ref 13.0–17.0)
Hemoglobin: 7.5 g/dL — ABNORMAL LOW (ref 13.0–17.0)
POTASSIUM: 4.4 mmol/L (ref 3.5–5.1)
POTASSIUM: 5.1 mmol/L (ref 3.5–5.1)
POTASSIUM: 4.1 mmol/L (ref 3.5–5.1)
POTASSIUM: 5.1 mmol/L (ref 3.5–5.1)
Potassium: 4.6 mmol/L (ref 3.5–5.1)
Potassium: 4.5 mmol/L (ref 3.5–5.1)
SODIUM: 136 mmol/L (ref 135–145)
SODIUM: 136 mmol/L (ref 135–145)
SODIUM: 141 mmol/L (ref 135–145)
Sodium: 138 mmol/L (ref 135–145)
Sodium: 138 mmol/L (ref 135–145)
Sodium: 140 mmol/L (ref 135–145)
TCO2: 26 mmol/L (ref 22–32)
TCO2: 26 mmol/L (ref 22–32)
TCO2: 27 mmol/L (ref 22–32)
TCO2: 26 mmol/L (ref 22–32)
TCO2: 26 mmol/L (ref 22–32)
TCO2: 23 mmol/L (ref 22–32)

## 2018-08-23 LAB — GLUCOSE, CAPILLARY
GLUCOSE-CAPILLARY: 135 mg/dL — AB (ref 70–99)
Glucose-Capillary: 95 mg/dL (ref 70–99)
Glucose-Capillary: 139 mg/dL — ABNORMAL HIGH (ref 70–99)
Glucose-Capillary: 58 mg/dL — ABNORMAL LOW (ref 70–99)
Glucose-Capillary: 166 mg/dL — ABNORMAL HIGH (ref 70–99)
Glucose-Capillary: 44 mg/dL — CL (ref 70–99)

## 2018-08-23 LAB — POCT I-STAT 3, ART BLOOD GAS (G3+)
ACID-BASE DEFICIT: 1 mmol/L (ref 0.0–2.0)
ACID-BASE DEFICIT: 2 mmol/L (ref 0.0–2.0)
ACID-BASE DEFICIT: 3 mmol/L — AB (ref 0.0–2.0)
ACID-BASE DEFICIT: 7 mmol/L — AB (ref 0.0–2.0)
Acid-base deficit: 5 mmol/L — ABNORMAL HIGH (ref 0.0–2.0)
BICARBONATE: 24.2 mmol/L (ref 20.0–28.0)
BICARBONATE: 23.5 mmol/L (ref 20.0–28.0)
BICARBONATE: 22.4 mmol/L (ref 20.0–28.0)
Bicarbonate: 21 mmol/L (ref 20.0–28.0)
Bicarbonate: 19 mmol/L — ABNORMAL LOW (ref 20.0–28.0)
O2 SAT: 96 %
O2 Saturation: 100 %
O2 Saturation: 100 %
O2 Saturation: 100 %
O2 Saturation: 99 %
PH ART: 7.318 — AB (ref 7.350–7.450)
PO2 ART: 426 mmHg — AB (ref 83.0–108.0)
TCO2: 26 mmol/L (ref 22–32)
TCO2: 25 mmol/L (ref 22–32)
TCO2: 24 mmol/L (ref 22–32)
TCO2: 22 mmol/L (ref 22–32)
TCO2: 20 mmol/L — ABNORMAL LOW (ref 22–32)
pCO2 arterial: 41.8 mmHg (ref 32.0–48.0)
pCO2 arterial: 40.3 mmHg (ref 32.0–48.0)
pCO2 arterial: 41.6 mmHg (ref 32.0–48.0)
pCO2 arterial: 41 mmHg (ref 32.0–48.0)
pCO2 arterial: 38.1 mmHg (ref 32.0–48.0)
pH, Arterial: 7.372 (ref 7.350–7.450)
pH, Arterial: 7.373 (ref 7.350–7.450)
pH, Arterial: 7.338 — ABNORMAL LOW (ref 7.350–7.450)
pH, Arterial: 7.307 — ABNORMAL LOW (ref 7.350–7.450)
pO2, Arterial: 421 mmHg — ABNORMAL HIGH (ref 83.0–108.0)
pO2, Arterial: 195 mmHg — ABNORMAL HIGH (ref 83.0–108.0)
pO2, Arterial: 177 mmHg — ABNORMAL HIGH (ref 83.0–108.0)
pO2, Arterial: 89 mmHg (ref 83.0–108.0)

## 2018-08-23 LAB — BASIC METABOLIC PANEL
Anion gap: 7 (ref 5–15)
BUN: 23 mg/dL (ref 8–23)
CALCIUM: 9.1 mg/dL (ref 8.9–10.3)
CO2: 24 mmol/L (ref 22–32)
CREATININE: 1.26 mg/dL — AB (ref 0.61–1.24)
Chloride: 107 mmol/L (ref 98–111)
GFR calc non Af Amer: 59 mL/min — ABNORMAL LOW (ref >60)
GLUCOSE: 175 mg/dL — AB (ref 70–99)
Potassium: 4.3 mmol/L (ref 3.5–5.1)
Sodium: 138 mmol/L (ref 135–145)

## 2018-08-23 LAB — CBC
HCT: 30.8 % — ABNORMAL LOW (ref 39.0–52.0)
HCT: 24.9 % — ABNORMAL LOW (ref 39.0–52.0)
HCT: 28.2 % — ABNORMAL LOW (ref 39.0–52.0)
Hemoglobin: 9.9 g/dL — ABNORMAL LOW (ref 13.0–17.0)
Hemoglobin: 8 g/dL — ABNORMAL LOW (ref 13.0–17.0)
Hemoglobin: 9 g/dL — ABNORMAL LOW (ref 13.0–17.0)
MCH: 31.4 pg (ref 26.0–34.0)
MCH: 31.9 pg (ref 26.0–34.0)
MCH: 31.6 pg (ref 26.0–34.0)
MCHC: 32.1 g/dL (ref 30.0–36.0)
MCHC: 32.1 g/dL (ref 30.0–36.0)
MCHC: 31.9 g/dL (ref 30.0–36.0)
MCV: 97.8 fL (ref 78.0–100.0)
MCV: 99.2 fL (ref 78.0–100.0)
MCV: 98.9 fL (ref 78.0–100.0)
PLATELETS: 159 10*3/uL (ref 150–400)
PLATELETS: 198 10*3/uL (ref 150–400)
Platelets: 257 10*3/uL (ref 150–400)
RBC: 3.15 MIL/uL — ABNORMAL LOW (ref 4.22–5.81)
RBC: 2.51 MIL/uL — ABNORMAL LOW (ref 4.22–5.81)
RBC: 2.85 MIL/uL — ABNORMAL LOW (ref 4.22–5.81)
RDW: 13 % (ref 11.5–15.5)
RDW: 13 % (ref 11.5–15.5)
RDW: 13.1 % (ref 11.5–15.5)
WBC: 7.8 10*3/uL (ref 4.0–10.5)
WBC: 10.5 10*3/uL (ref 4.0–10.5)
WBC: 13.2 10*3/uL — ABNORMAL HIGH (ref 4.0–10.5)

## 2018-08-23 LAB — PREPARE RBC (CROSSMATCH)

## 2018-08-23 LAB — POCT I-STAT 4, (NA,K, GLUC, HGB,HCT)
Glucose, Bld: 132 mg/dL — ABNORMAL HIGH (ref 70–99)
HEMATOCRIT: 22 % — AB (ref 39.0–52.0)
HEMOGLOBIN: 7.5 g/dL — AB (ref 13.0–17.0)
POTASSIUM: 3.6 mmol/L (ref 3.5–5.1)
SODIUM: 143 mmol/L (ref 135–145)

## 2018-08-23 LAB — MAGNESIUM: MAGNESIUM: 2.9 mg/dL — AB (ref 1.7–2.4)

## 2018-08-23 LAB — PLATELET COUNT: PLATELETS: 185 10*3/uL (ref 150–400)

## 2018-08-23 LAB — CREATININE, SERUM: Creatinine, Ser: 1.19 mg/dL (ref 0.61–1.24)

## 2018-08-23 LAB — PROTIME-INR
INR: 1.23
PROTHROMBIN TIME: 15.4 s — AB (ref 11.4–15.2)

## 2018-08-23 LAB — HEMOGLOBIN AND HEMATOCRIT, BLOOD
HCT: 24.4 % — ABNORMAL LOW (ref 39.0–52.0)
Hemoglobin: 8 g/dL — ABNORMAL LOW (ref 13.0–17.0)

## 2018-08-23 LAB — APTT: APTT: 31 s (ref 24–36)

## 2018-08-23 SURGERY — CORONARY ARTERY BYPASS GRAFTING (CABG)
Anesthesia: General | Site: Chest

## 2018-08-23 MED ORDER — SODIUM CHLORIDE 0.9% FLUSH: 10.0000 mL | INTRAVENOUS | Status: DC | PRN

## 2018-08-23 MED ORDER — BISACODYL 5 MG PO TBEC
10.0000 mg | DELAYED_RELEASE_TABLET | Freq: Every day | ORAL | Status: DC
  Administered 2018-08-25 – 2018-08-26 (×2): 10 mg via ORAL
  Filled 2018-08-23 (×3): qty 2

## 2018-08-23 MED ORDER — DEXTROSE 50 % IV SOLN: 17.0000 mL | Freq: Once | INTRAVENOUS | Status: AC

## 2018-08-23 MED ORDER — LACTATED RINGERS IV SOLN: INTRAVENOUS | Status: DC

## 2018-08-23 MED ORDER — ROCURONIUM BROMIDE 50 MG/5ML IV SOSY
PREFILLED_SYRINGE | INTRAVENOUS | Status: AC
  Filled 2018-08-23: qty 10

## 2018-08-23 MED ORDER — ACETAMINOPHEN 500 MG PO TABS
1000.0000 mg | ORAL_TABLET | Freq: Four times a day (QID) | ORAL | Status: DC
  Administered 2018-08-23 – 2018-08-28 (×17): 1000 mg via ORAL
  Filled 2018-08-23 (×18): qty 2

## 2018-08-23 MED ORDER — EPHEDRINE SULFATE-NACL 50-0.9 MG/10ML-% IV SOSY
PREFILLED_SYRINGE | INTRAVENOUS | Status: DC | PRN
  Administered 2018-08-23 (×2): 5 mg via INTRAVENOUS

## 2018-08-23 MED ORDER — ACETAMINOPHEN 650 MG RE SUPP: 650.0000 mg | Freq: Once | RECTAL | Status: AC

## 2018-08-23 MED ORDER — PROTAMINE SULFATE 10 MG/ML IV SOLN
INTRAVENOUS | Status: AC
  Filled 2018-08-23: qty 25

## 2018-08-23 MED ORDER — LACTATED RINGERS IV SOLN
INTRAVENOUS | Status: DC | PRN
  Administered 2018-08-23 (×2): via INTRAVENOUS

## 2018-08-23 MED ORDER — PHENYLEPHRINE 40 MCG/ML (10ML) SYRINGE FOR IV PUSH (FOR BLOOD PRESSURE SUPPORT)
PREFILLED_SYRINGE | INTRAVENOUS | Status: AC
  Filled 2018-08-23: qty 20

## 2018-08-23 MED ORDER — VANCOMYCIN HCL IN DEXTROSE 1-5 GM/200ML-% IV SOLN
1000.0000 mg | Freq: Once | INTRAVENOUS | Status: AC
  Administered 2018-08-23: 1000 mg via INTRAVENOUS
  Filled 2018-08-23: qty 200

## 2018-08-23 MED ORDER — ASPIRIN 81 MG PO CHEW: 324.0000 mg | CHEWABLE_TABLET | Freq: Every day | ORAL | Status: DC

## 2018-08-23 MED ORDER — CHLORHEXIDINE GLUCONATE CLOTH 2 % EX PADS
6.0000 | MEDICATED_PAD | Freq: Every day | CUTANEOUS | Status: DC
  Administered 2018-08-23 – 2018-08-25 (×2): 6 via TOPICAL

## 2018-08-23 MED ORDER — CLOPIDOGREL BISULFATE 75 MG PO TABS
75.0000 mg | ORAL_TABLET | Freq: Every day | ORAL | Status: DC
  Administered 2018-08-25 – 2018-08-28 (×4): 75 mg via ORAL
  Filled 2018-08-23 (×4): qty 1

## 2018-08-23 MED ORDER — ACETAMINOPHEN 160 MG/5ML PO SOLN: 1000.0000 mg | Freq: Four times a day (QID) | ORAL | Status: DC

## 2018-08-23 MED ORDER — MORPHINE SULFATE (PF) 2 MG/ML IV SOLN
1.0000 mg | INTRAVENOUS | Status: AC | PRN
  Administered 2018-08-23: 4 mg via INTRAVENOUS
  Administered 2018-08-23 (×2): 2 mg via INTRAVENOUS
  Filled 2018-08-23 (×3): qty 1
  Filled 2018-08-23: qty 2

## 2018-08-23 MED ORDER — SODIUM CHLORIDE 0.9 % IV SOLN
INTRAVENOUS | Status: DC | PRN
  Administered 2018-08-23: 0.4 ug/kg/h via INTRAVENOUS

## 2018-08-23 MED ORDER — PHENYLEPHRINE 40 MCG/ML (10ML) SYRINGE FOR IV PUSH (FOR BLOOD PRESSURE SUPPORT)
PREFILLED_SYRINGE | INTRAVENOUS | Status: DC | PRN
  Administered 2018-08-23: 120 ug via INTRAVENOUS
  Administered 2018-08-23 (×8): 80 ug via INTRAVENOUS

## 2018-08-23 MED ORDER — SODIUM CHLORIDE 0.9 % IV SOLN
INTRAVENOUS | Status: DC
  Filled 2018-08-23: qty 1

## 2018-08-23 MED ORDER — SODIUM CHLORIDE 0.9 % IV SOLN
INTRAVENOUS | Status: DC | PRN
  Administered 2018-08-23: 12:00:00 via INTRAVENOUS

## 2018-08-23 MED ORDER — 0.9 % SODIUM CHLORIDE (POUR BTL) OPTIME
TOPICAL | Status: DC | PRN
  Administered 2018-08-23 (×5): 1000 mL

## 2018-08-23 MED ORDER — FENTANYL CITRATE (PF) 250 MCG/5ML IJ SOLN
INTRAMUSCULAR | Status: AC
  Filled 2018-08-23: qty 5

## 2018-08-23 MED ORDER — MIDAZOLAM HCL 10 MG/2ML IJ SOLN
INTRAMUSCULAR | Status: AC
  Filled 2018-08-23: qty 2

## 2018-08-23 MED ORDER — ORAL CARE MOUTH RINSE
15.0000 mL | OROMUCOSAL | Status: DC
  Administered 2018-08-23 (×2): 15 mL via OROMUCOSAL

## 2018-08-23 MED ORDER — ONDANSETRON HCL 4 MG/2ML IJ SOLN
INTRAMUSCULAR | Status: DC | PRN
  Administered 2018-08-23: 4 mg via INTRAVENOUS

## 2018-08-23 MED ORDER — SODIUM CHLORIDE 0.9 % IV SOLN
INTRAVENOUS | Status: DC | PRN
  Administered 2018-08-23: 40 ug/min via INTRAVENOUS

## 2018-08-23 MED ORDER — HEPARIN SODIUM (PORCINE) 1000 UNIT/ML IJ SOLN
INTRAMUSCULAR | Status: DC | PRN
  Administered 2018-08-23: 23 mL via INTRAVENOUS
  Administered 2018-08-23: 10 mL via INTRAVENOUS

## 2018-08-23 MED ORDER — CHLORHEXIDINE GLUCONATE 0.12% ORAL RINSE (MEDLINE KIT): 15.0000 mL | Freq: Two times a day (BID) | OROMUCOSAL | Status: DC

## 2018-08-23 MED ORDER — HEPARIN SODIUM (PORCINE) 1000 UNIT/ML IJ SOLN
INTRAMUSCULAR | Status: AC
  Filled 2018-08-23: qty 1

## 2018-08-23 MED ORDER — TRAMADOL HCL 50 MG PO TABS
50.0000 mg | ORAL_TABLET | ORAL | Status: DC | PRN
  Administered 2018-08-23 – 2018-08-26 (×3): 100 mg via ORAL
  Administered 2018-08-28: 50 mg via ORAL
  Filled 2018-08-23 (×2): qty 2
  Filled 2018-08-23: qty 1
  Filled 2018-08-23: qty 2

## 2018-08-23 MED ORDER — METOPROLOL TARTRATE 12.5 MG HALF TABLET
12.5000 mg | ORAL_TABLET | Freq: Two times a day (BID) | ORAL | Status: DC
  Administered 2018-08-23 – 2018-08-26 (×7): 12.5 mg via ORAL
  Filled 2018-08-23 (×7): qty 1

## 2018-08-23 MED ORDER — ALBUMIN HUMAN 5 % IV SOLN
INTRAVENOUS | Status: DC | PRN
  Administered 2018-08-23: 11:00:00 via INTRAVENOUS

## 2018-08-23 MED ORDER — MIDAZOLAM HCL 2 MG/2ML IJ SOLN: 2.0000 mg | INTRAMUSCULAR | Status: DC | PRN

## 2018-08-23 MED ORDER — MAGNESIUM SULFATE 4 GM/100ML IV SOLN
4.0000 g | Freq: Once | INTRAVENOUS | Status: AC
  Administered 2018-08-23: 4 g via INTRAVENOUS
  Filled 2018-08-23: qty 100

## 2018-08-23 MED ORDER — MIDAZOLAM HCL 5 MG/5ML IJ SOLN
INTRAMUSCULAR | Status: DC | PRN
  Administered 2018-08-23: 2 mg via INTRAVENOUS
  Administered 2018-08-23 (×2): 4 mg via INTRAVENOUS
  Administered 2018-08-23: 5 mg via INTRAVENOUS

## 2018-08-23 MED ORDER — METOPROLOL TARTRATE 5 MG/5ML IV SOLN: 2.5000 mg | INTRAVENOUS | Status: DC | PRN

## 2018-08-23 MED ORDER — SODIUM BICARBONATE 8.4 % IV SOLN
50.0000 meq | Freq: Once | INTRAVENOUS | Status: AC
  Administered 2018-08-23: 50 meq via INTRAVENOUS

## 2018-08-23 MED ORDER — NITROGLYCERIN IN D5W 200-5 MCG/ML-% IV SOLN: 0.0000 ug/min | INTRAVENOUS | Status: DC

## 2018-08-23 MED ORDER — ROCURONIUM BROMIDE 50 MG/5ML IV SOSY
PREFILLED_SYRINGE | INTRAVENOUS | Status: AC
  Filled 2018-08-23: qty 5

## 2018-08-23 MED ORDER — LACTATED RINGERS IV SOLN
INTRAVENOUS | Status: DC | PRN
  Administered 2018-08-23 (×2): via INTRAVENOUS

## 2018-08-23 MED ORDER — ASPIRIN EC 325 MG PO TBEC: 325.0000 mg | DELAYED_RELEASE_TABLET | Freq: Every day | ORAL | Status: DC

## 2018-08-23 MED ORDER — PHENYLEPHRINE 40 MCG/ML (10ML) SYRINGE FOR IV PUSH (FOR BLOOD PRESSURE SUPPORT)
PREFILLED_SYRINGE | INTRAVENOUS | Status: AC
  Filled 2018-08-23: qty 10

## 2018-08-23 MED ORDER — SODIUM CHLORIDE 0.9 % IV SOLN
INTRAVENOUS | Status: DC
  Administered 2018-08-23: 20 mL/h via INTRAVENOUS

## 2018-08-23 MED ORDER — SODIUM CHLORIDE 0.9% FLUSH: 3.0000 mL | INTRAVENOUS | Status: DC | PRN

## 2018-08-23 MED ORDER — SODIUM CHLORIDE 0.9 % IV SOLN
INTRAVENOUS | Status: DC | PRN
  Administered 2018-08-23: 4.6 [IU]/h via INTRAVENOUS

## 2018-08-23 MED ORDER — SODIUM CHLORIDE 0.9 % IV SOLN
INTRAVENOUS | Status: DC | PRN
  Administered 2018-08-23: 30 ug/min via INTRAVENOUS

## 2018-08-23 MED ORDER — PANTOPRAZOLE SODIUM 40 MG PO TBEC
40.0000 mg | DELAYED_RELEASE_TABLET | Freq: Every day | ORAL | Status: DC
  Administered 2018-08-25 – 2018-08-28 (×4): 40 mg via ORAL
  Filled 2018-08-23 (×4): qty 1

## 2018-08-23 MED ORDER — SODIUM CHLORIDE 0.9 % IV SOLN
1.5000 g | Freq: Two times a day (BID) | INTRAVENOUS | Status: AC
  Administered 2018-08-23 – 2018-08-25 (×4): 1.5 g via INTRAVENOUS
  Filled 2018-08-23 (×4): qty 1.5

## 2018-08-23 MED ORDER — ONDANSETRON HCL 4 MG/2ML IJ SOLN: 4.0000 mg | Freq: Four times a day (QID) | INTRAMUSCULAR | Status: DC | PRN

## 2018-08-23 MED ORDER — PRAVASTATIN SODIUM 20 MG PO TABS
20.0000 mg | ORAL_TABLET | ORAL | Status: DC
  Administered 2018-08-27: 20 mg via ORAL
  Filled 2018-08-23: qty 1

## 2018-08-23 MED ORDER — TRANEXAMIC ACID 1000 MG/10ML IV SOLN
INTRAVENOUS | Status: DC | PRN
  Administered 2018-08-23: 1.5 mg/kg/h via INTRAVENOUS

## 2018-08-23 MED ORDER — CHLORHEXIDINE GLUCONATE 0.12 % MT SOLN
15.0000 mL | OROMUCOSAL | Status: AC
  Administered 2018-08-23: 15 mL via OROMUCOSAL

## 2018-08-23 MED ORDER — PROPOFOL 10 MG/ML IV BOLUS
INTRAVENOUS | Status: AC
  Filled 2018-08-23: qty 20

## 2018-08-23 MED ORDER — BISACODYL 10 MG RE SUPP: 10.0000 mg | Freq: Every day | RECTAL | Status: DC

## 2018-08-23 MED ORDER — SODIUM CHLORIDE 0.9% FLUSH: 3.0000 mL | Freq: Two times a day (BID) | INTRAVENOUS | Status: DC

## 2018-08-23 MED ORDER — ARTIFICIAL TEARS OPHTHALMIC OINT
TOPICAL_OINTMENT | OPHTHALMIC | Status: AC
  Filled 2018-08-23: qty 3.5

## 2018-08-23 MED ORDER — SODIUM CHLORIDE 0.9% FLUSH
10.0000 mL | Freq: Two times a day (BID) | INTRAVENOUS | Status: DC
  Administered 2018-08-25 – 2018-08-26 (×4): 10 mL

## 2018-08-23 MED ORDER — PROPOFOL 10 MG/ML IV BOLUS
INTRAVENOUS | Status: DC | PRN
  Administered 2018-08-23 (×2): 100 mg via INTRAVENOUS

## 2018-08-23 MED ORDER — DOCUSATE SODIUM 100 MG PO CAPS
200.0000 mg | ORAL_CAPSULE | Freq: Every day | ORAL | Status: DC
  Administered 2018-08-24 – 2018-08-26 (×3): 200 mg via ORAL
  Filled 2018-08-23 (×3): qty 2

## 2018-08-23 MED ORDER — PHENYLEPHRINE HCL-NACL 20-0.9 MG/250ML-% IV SOLN
0.0000 ug/min | INTRAVENOUS | Status: DC
  Administered 2018-08-24: 20 ug/min via INTRAVENOUS
  Filled 2018-08-23 (×2): qty 250

## 2018-08-23 MED ORDER — LACTATED RINGERS IV SOLN: 500.0000 mL | Freq: Once | INTRAVENOUS | Status: DC | PRN

## 2018-08-23 MED ORDER — DEXMEDETOMIDINE HCL IN NACL 400 MCG/100ML IV SOLN
0.0000 ug/kg/h | INTRAVENOUS | Status: DC
  Administered 2018-08-24: 0.1 ug/kg/h via INTRAVENOUS
  Administered 2018-08-24: 0.2 ug/kg/h via INTRAVENOUS
  Filled 2018-08-23: qty 100

## 2018-08-23 MED ORDER — ALBUMIN HUMAN 5 % IV SOLN
250.0000 mL | INTRAVENOUS | Status: AC | PRN
  Administered 2018-08-23 – 2018-08-24 (×2): 12.5 g via INTRAVENOUS

## 2018-08-23 MED ORDER — ARTIFICIAL TEARS OPHTHALMIC OINT
TOPICAL_OINTMENT | OPHTHALMIC | Status: DC | PRN
  Administered 2018-08-23: 1 via OPHTHALMIC

## 2018-08-23 MED ORDER — FENTANYL CITRATE (PF) 250 MCG/5ML IJ SOLN
INTRAMUSCULAR | Status: DC | PRN
  Administered 2018-08-23: 100 ug via INTRAVENOUS
  Administered 2018-08-23: 50 ug via INTRAVENOUS
  Administered 2018-08-23 (×2): 250 ug via INTRAVENOUS
  Administered 2018-08-23: 150 ug via INTRAVENOUS
  Administered 2018-08-23 (×2): 50 ug via INTRAVENOUS
  Administered 2018-08-23: 150 ug via INTRAVENOUS
  Administered 2018-08-23: 250 ug via INTRAVENOUS
  Administered 2018-08-23: 200 ug via INTRAVENOUS

## 2018-08-23 MED ORDER — SODIUM CHLORIDE 0.9 % IV SOLN: 250.0000 mL | INTRAVENOUS | Status: DC

## 2018-08-23 MED ORDER — INSULIN REGULAR BOLUS VIA INFUSION
0.0000 [IU] | Freq: Three times a day (TID) | INTRAVENOUS | Status: DC
  Administered 2018-08-24: 2 [IU] via INTRAVENOUS
  Filled 2018-08-23: qty 10

## 2018-08-23 MED ORDER — SODIUM CHLORIDE 0.9 % IV SOLN
INTRAVENOUS | Status: AC
  Administered 2018-08-23: 13:00:00 via INTRAVENOUS

## 2018-08-23 MED ORDER — DEXTROSE 50 % IV SOLN
INTRAVENOUS | Status: AC
  Administered 2018-08-23: 17 mL via INTRAVENOUS
  Filled 2018-08-23: qty 50

## 2018-08-23 MED ORDER — PROTAMINE SULFATE 10 MG/ML IV SOLN
INTRAVENOUS | Status: DC | PRN
  Administered 2018-08-23: 50 mg via INTRAVENOUS
  Administered 2018-08-23: 30 mg via INTRAVENOUS
  Administered 2018-08-23: 50 mg via INTRAVENOUS
  Administered 2018-08-23: 120 mg via INTRAVENOUS

## 2018-08-23 MED ORDER — METOPROLOL TARTRATE 25 MG/10 ML ORAL SUSPENSION: 12.5000 mg | Freq: Two times a day (BID) | ORAL | Status: DC

## 2018-08-23 MED ORDER — ASPIRIN EC 81 MG PO TBEC: 81.0000 mg | DELAYED_RELEASE_TABLET | Freq: Every day | ORAL | Status: DC

## 2018-08-23 MED ORDER — LIDOCAINE 2% (20 MG/ML) 5 ML SYRINGE
INTRAMUSCULAR | Status: AC
  Filled 2018-08-23: qty 5

## 2018-08-23 MED ORDER — ACETAMINOPHEN 160 MG/5ML PO SOLN
650.0000 mg | Freq: Once | ORAL | Status: AC
  Administered 2018-08-23: 650 mg

## 2018-08-23 MED ORDER — DEXTROSE 50 % IV SOLN
22.0000 mL | Freq: Once | INTRAVENOUS | Status: AC
  Administered 2018-08-23: 22 mL via INTRAVENOUS

## 2018-08-23 MED ORDER — POTASSIUM CHLORIDE 10 MEQ/50ML IV SOLN
10.0000 meq | INTRAVENOUS | Status: AC
  Administered 2018-08-23 (×3): 10 meq via INTRAVENOUS

## 2018-08-23 MED ORDER — ASPIRIN EC 81 MG PO TBEC
81.0000 mg | DELAYED_RELEASE_TABLET | Freq: Every day | ORAL | Status: DC
  Administered 2018-08-25 – 2018-08-28 (×4): 81 mg via ORAL
  Filled 2018-08-23 (×4): qty 1

## 2018-08-23 MED ORDER — EPHEDRINE 5 MG/ML INJ
INTRAVENOUS | Status: AC
  Filled 2018-08-23: qty 10

## 2018-08-23 MED ORDER — OXYCODONE HCL 5 MG PO TABS
5.0000 mg | ORAL_TABLET | ORAL | Status: DC | PRN
  Administered 2018-08-23 (×2): 5 mg via ORAL
  Administered 2018-08-24 (×5): 10 mg via ORAL
  Administered 2018-08-25: 5 mg via ORAL
  Administered 2018-08-25 – 2018-08-28 (×5): 10 mg via ORAL
  Filled 2018-08-23 (×4): qty 2
  Filled 2018-08-23: qty 1
  Filled 2018-08-23 (×2): qty 2
  Filled 2018-08-23: qty 1
  Filled 2018-08-23: qty 2
  Filled 2018-08-23: qty 1
  Filled 2018-08-23 (×3): qty 2

## 2018-08-23 MED ORDER — MORPHINE SULFATE (PF) 2 MG/ML IV SOLN
1.0000 mg | INTRAVENOUS | Status: DC | PRN
  Administered 2018-08-23 – 2018-08-24 (×4): 2 mg via INTRAVENOUS
  Filled 2018-08-23 (×4): qty 1

## 2018-08-23 MED ORDER — FAMOTIDINE IN NACL 20-0.9 MG/50ML-% IV SOLN
20.0000 mg | Freq: Two times a day (BID) | INTRAVENOUS | Status: AC
  Administered 2018-08-23 (×2): 20 mg via INTRAVENOUS
  Filled 2018-08-23: qty 50

## 2018-08-23 MED ORDER — ROCURONIUM BROMIDE 100 MG/10ML IV SOLN
INTRAVENOUS | Status: DC | PRN
  Administered 2018-08-23 (×3): 50 mg via INTRAVENOUS
  Administered 2018-08-23: 30 mg via INTRAVENOUS
  Administered 2018-08-23: 50 mg via INTRAVENOUS

## 2018-08-23 MED ORDER — ONDANSETRON HCL 4 MG/2ML IJ SOLN
INTRAMUSCULAR | Status: AC
  Filled 2018-08-23: qty 2

## 2018-08-23 MED ORDER — HEMOSTATIC AGENTS (NO CHARGE) OPTIME
TOPICAL | Status: DC | PRN
  Administered 2018-08-23 (×2): 1 via TOPICAL

## 2018-08-23 MED ORDER — ASPIRIN EC 325 MG PO TBEC
325.0000 mg | DELAYED_RELEASE_TABLET | Freq: Every day | ORAL | Status: AC
  Administered 2018-08-24: 325 mg via ORAL
  Filled 2018-08-23: qty 1

## 2018-08-23 MED ORDER — SODIUM CHLORIDE 0.45 % IV SOLN: INTRAVENOUS | Status: DC | PRN

## 2018-08-23 SURGICAL SUPPLY — 100 items
ADH SKN CLS APL DERMABOND .7 (GAUZE/BANDAGES/DRESSINGS) ×2
BAG DECANTER FOR FLEXI CONT (MISCELLANEOUS) ×6 IMPLANT
BANDAGE ACE 4X5 VEL STRL LF (GAUZE/BANDAGES/DRESSINGS) ×3 IMPLANT
BANDAGE ACE 6X5 VEL STRL LF (GAUZE/BANDAGES/DRESSINGS) ×3 IMPLANT
BASKET HEART (ORDER IN 25'S) (MISCELLANEOUS) ×1
BASKET HEART (ORDER IN 25S) (MISCELLANEOUS) ×2 IMPLANT
BLADE CLIPPER SURG (BLADE) ×1 IMPLANT
BLADE STERNUM SYSTEM 6 (BLADE) ×3 IMPLANT
BLADE SURG 11 STRL SS (BLADE) ×1 IMPLANT
BNDG GAUZE ELAST 4 BULKY (GAUZE/BANDAGES/DRESSINGS) ×3 IMPLANT
CANISTER SUCT 3000ML PPV (MISCELLANEOUS) ×3 IMPLANT
CANNULA EZ GLIDE AORTIC 21FR (CANNULA) ×6 IMPLANT
CATH CPB KIT OWEN (MISCELLANEOUS) ×3 IMPLANT
CATH THORACIC 36FR (CATHETERS) ×3 IMPLANT
CLIP VESOCCLUDE MED 24/CT (CLIP) IMPLANT
CLIP VESOCCLUDE SM WIDE 24/CT (CLIP) IMPLANT
CRADLE DONUT ADULT HEAD (MISCELLANEOUS) ×3 IMPLANT
DERMABOND ADVANCED (GAUZE/BANDAGES/DRESSINGS) ×1
DERMABOND ADVANCED .7 DNX12 (GAUZE/BANDAGES/DRESSINGS) IMPLANT
DRAIN CHANNEL 32F RND 10.7 FF (WOUND CARE) ×7 IMPLANT
DRAPE CARDIOVASCULAR INCISE (DRAPES) ×3
DRAPE INCISE IOBAN 66X45 STRL (DRAPES) ×3 IMPLANT
DRAPE SLUSH/WARMER DISC (DRAPES) ×3 IMPLANT
DRAPE SRG 135X102X78XABS (DRAPES) ×2 IMPLANT
DRSG AQUACEL AG ADV 3.5X 4 (GAUZE/BANDAGES/DRESSINGS) ×1 IMPLANT
DRSG COVADERM 4X14 (GAUZE/BANDAGES/DRESSINGS) ×3 IMPLANT
ELECT BLADE 4.0 EZ CLEAN MEGAD (MISCELLANEOUS) ×3
ELECT REM PT RETURN 9FT ADLT (ELECTROSURGICAL) ×6
ELECTRODE BLDE 4.0 EZ CLN MEGD (MISCELLANEOUS) ×2 IMPLANT
ELECTRODE REM PT RTRN 9FT ADLT (ELECTROSURGICAL) ×4 IMPLANT
FELT TEFLON 1X6 (MISCELLANEOUS) ×5 IMPLANT
GAUZE SPONGE 4X4 12PLY STRL (GAUZE/BANDAGES/DRESSINGS) ×6 IMPLANT
GAUZE SPONGE 4X4 12PLY STRL LF (GAUZE/BANDAGES/DRESSINGS) ×2 IMPLANT
GLOVE BIOGEL M 7.0 STRL (GLOVE) ×5 IMPLANT
GLOVE ORTHO TXT STRL SZ7.5 (GLOVE) ×6 IMPLANT
GOWN STRL REUS W/ TWL LRG LVL3 (GOWN DISPOSABLE) ×8 IMPLANT
GOWN STRL REUS W/TWL LRG LVL3 (GOWN DISPOSABLE) ×15
HEMOSTAT POWDER SURGIFOAM 1G (HEMOSTASIS) ×6 IMPLANT
INSERT FOGARTY XLG (MISCELLANEOUS) ×3 IMPLANT
KIT BASIN OR (CUSTOM PROCEDURE TRAY) ×3 IMPLANT
KIT SUCTION CATH 14FR (SUCTIONS) ×9 IMPLANT
KIT TURNOVER KIT B (KITS) ×3 IMPLANT
KIT VASOVIEW HEMOPRO VH 3000 (KITS) ×3 IMPLANT
LEAD PACING MYOCARDI (MISCELLANEOUS) ×3 IMPLANT
MARKER GRAFT CORONARY BYPASS (MISCELLANEOUS) ×9 IMPLANT
NS IRRIG 1000ML POUR BTL (IV SOLUTION) ×15 IMPLANT
PACK E OPEN HEART (SUTURE) ×3 IMPLANT
PACK OPEN HEART (CUSTOM PROCEDURE TRAY) ×3 IMPLANT
PAD ARMBOARD 7.5X6 YLW CONV (MISCELLANEOUS) ×6 IMPLANT
PAD ELECT DEFIB RADIOL ZOLL (MISCELLANEOUS) ×3 IMPLANT
PENCIL BUTTON HOLSTER BLD 10FT (ELECTRODE) ×3 IMPLANT
POWDER SURGICEL 3.0 GRAM (HEMOSTASIS) ×2 IMPLANT
PUNCH AORTIC ROTATE 4.0MM (MISCELLANEOUS) ×1 IMPLANT
PUNCH AORTIC ROTATE 4.5MM 8IN (MISCELLANEOUS) IMPLANT
PUNCH AORTIC ROTATE 5MM 8IN (MISCELLANEOUS) IMPLANT
SENSOR MYOCARDIAL TEMP (MISCELLANEOUS) ×1 IMPLANT
SET CARDIOPLEGIA MPS 5001102 (MISCELLANEOUS) ×1 IMPLANT
SOLUTION ANTI FOG 6CC (MISCELLANEOUS) ×1 IMPLANT
SPONGE LAP 18X18 X RAY DECT (DISPOSABLE) IMPLANT
SPONGE LAP 4X18 RFD (DISPOSABLE) IMPLANT
SUT BONE WAX W31G (SUTURE) ×3 IMPLANT
SUT ETHIBOND X763 2 0 SH 1 (SUTURE) ×6 IMPLANT
SUT MNCRL AB 3-0 PS2 18 (SUTURE) ×6 IMPLANT
SUT MNCRL AB 4-0 PS2 18 (SUTURE) ×1 IMPLANT
SUT PDS AB 1 CTX 36 (SUTURE) ×6 IMPLANT
SUT PROLENE 2 0 SH DA (SUTURE) IMPLANT
SUT PROLENE 3 0 SH DA (SUTURE) ×3 IMPLANT
SUT PROLENE 3 0 SH1 36 (SUTURE) IMPLANT
SUT PROLENE 4 0 RB 1 (SUTURE)
SUT PROLENE 4 0 SH DA (SUTURE) IMPLANT
SUT PROLENE 4-0 RB1 .5 CRCL 36 (SUTURE) IMPLANT
SUT PROLENE 5 0 C 1 36 (SUTURE) IMPLANT
SUT PROLENE 6 0 C 1 30 (SUTURE) ×2 IMPLANT
SUT PROLENE 7.0 RB 3 (SUTURE) ×9 IMPLANT
SUT PROLENE 8 0 BV175 6 (SUTURE) ×3 IMPLANT
SUT PROLENE BLUE 7 0 (SUTURE) ×3 IMPLANT
SUT PROLENE POLY MONO (SUTURE) IMPLANT
SUT SILK  1 MH (SUTURE) ×1
SUT SILK 1 MH (SUTURE) ×2 IMPLANT
SUT STEEL 6MS V (SUTURE) IMPLANT
SUT STEEL STERNAL CCS#1 18IN (SUTURE) IMPLANT
SUT STEEL SZ 6 DBL 3X14 BALL (SUTURE) IMPLANT
SUT VIC AB 1 CTX 36 (SUTURE)
SUT VIC AB 1 CTX36XBRD ANBCTR (SUTURE) IMPLANT
SUT VIC AB 2-0 CT1 27 (SUTURE) ×3
SUT VIC AB 2-0 CT1 TAPERPNT 27 (SUTURE) IMPLANT
SUT VIC AB 2-0 CTX 27 (SUTURE) IMPLANT
SUT VIC AB 3-0 SH 27 (SUTURE)
SUT VIC AB 3-0 SH 27X BRD (SUTURE) IMPLANT
SUT VIC AB 3-0 X1 27 (SUTURE) IMPLANT
SUT VICRYL 4-0 PS2 18IN ABS (SUTURE) IMPLANT
SYSTEM SAHARA CHEST DRAIN ATS (WOUND CARE) ×4 IMPLANT
TAPE CLOTH SURG 4X10 WHT LF (GAUZE/BANDAGES/DRESSINGS) ×2 IMPLANT
TAPE PAPER 2X10 WHT MICROPORE (GAUZE/BANDAGES/DRESSINGS) ×1 IMPLANT
TOWEL GREEN STERILE (TOWEL DISPOSABLE) ×3 IMPLANT
TOWEL GREEN STERILE FF (TOWEL DISPOSABLE) ×3 IMPLANT
TRAY FOLEY SLVR 16FR TEMP STAT (SET/KITS/TRAYS/PACK) ×3 IMPLANT
TUBING INSUFFLATION (TUBING) ×3 IMPLANT
UNDERPAD 30X30 (UNDERPADS AND DIAPERS) ×3 IMPLANT
WATER STERILE IRR 1000ML POUR (IV SOLUTION) ×6 IMPLANT

## 2018-08-23 NOTE — Anesthesia Preprocedure Evaluation (Addendum)
Anesthesia Evaluation  Patient identified by MRN, date of birth, ID band Patient awake    Reviewed: Allergy & Precautions, NPO status , Patient's Chart, lab work & pertinent test results  Airway Mallampati: I  TM Distance: >3 FB Neck ROM: Full    Dental  (+) Missing, Dental Advisory Given, Poor Dentition,    Pulmonary Current Smoker,    Pulmonary exam normal        Cardiovascular hypertension, Pt. on medications + CAD and + Past MI  Normal cardiovascular exam  ECG: Normal sinus rhythm Possible Inferior infarct, age undetermined Left ventricular hypertrophy  Mid LM to Dist LM lesion is 60% stenosed. Ost LAD to Prox LAD lesion is 30% stenosed. Prox LAD to Mid LAD lesion is 30% stenosed. Ost Cx to Prox Cx lesion is 99% stenosed. Prox RCA to Mid RCA lesion is 95% stenosed.   1.  Moderately severe distal left main stenosis with positive FFR of 0.75 2.  Critical heavily calcified mid RCA stenosis 3.  Critical heavily calcified proximal circumflex stenosis 4.  Nonobstructive LAD stenosis 5.  Mild segmental LV dysfunction with inferolateral hypokinesis and LVEF 50 to 55% by echo assessment 6.  Normal LVEDP  ECHO: Left ventricle: The cavity size was normal. There was mild concentric hypertrophy. Systolic function was normal. The estimated ejection fraction was in the range of 50% to 55%. Hypokinesis of the basal-midinferolateral and inferior myocardium. Doppler parameters are consistent with abnormal left ventricular relaxation (grade 1 diastolic dysfunction). Doppler parameters are consistent with indeterminate ventricular filling pressure. Aortic valve: Transvalvular velocity was within the normal range. There was no stenosis. There was no regurgitation. Valve area (VTI): 2.32 cm^2. Valve area (Vmax): 2.66 cm^2. Valve area (Vmean): 2.66 cm^2. Mitral valve: Moderately calcified annulus. Transvalvular velocity was within the normal  range. There was no evidence for stenosis. There was trivial regurgitation. Left atrium: The atrium was severely dilated. Right ventricle: The cavity size was normal. Wall thickness was normal. Systolic function was normal. Atrial septum: No defect or patent foramen ovale was identified by color flow Doppler. Tricuspid valve: There was no regurgitation. Pulmonary arteries: Systolic pressure was within the normal range. PA peak pressure: 9 mm Hg (S).   Neuro/Psych negative neurological ROS  negative psych ROS   GI/Hepatic Neg liver ROS, GERD  ,  Endo/Other  diabetes, Insulin Dependent, Oral Hypoglycemic Agents  Renal/GU negative Renal ROS     Musculoskeletal negative musculoskeletal ROS (+)   Abdominal   Peds  Hematology  (+) anemia , HLD   Anesthesia Other Findings CAD  Reproductive/Obstetrics                          Anesthesia Physical Anesthesia Plan  ASA: IV  Anesthesia Plan: General   Post-op Pain Management:    Induction: Intravenous  PONV Risk Score and Plan: 1 and Midazolam and Treatment may vary due to age or medical condition  Airway Management Planned: Oral ETT  Additional Equipment: Arterial line, CVP, PA Cath, TEE and Ultrasound Guidance Line Placement  Intra-op Plan:   Post-operative Plan: Post-operative intubation/ventilation  Informed Consent: I have reviewed the patients History and Physical, chart, labs and discussed the procedure including the risks, benefits and alternatives for the proposed anesthesia with the patient or authorized representative who has indicated his/her understanding and acceptance.   Dental advisory given  Plan Discussed with: CRNA  Anesthesia Plan Comments:         Anesthesia Quick Evaluation

## 2018-08-23 NOTE — Discharge Summary (Signed)
Physician Discharge Summary  Patient ID: CEASER EBELING MRN: [id number] DOB/AGE: 08-16-1956 62 y.o.  Admit date: 08/18/2018 Discharge date: 08/28/2018  Admission Diagnoses:  Patient Active Problem List   Diagnosis Date Noted  . Coronary artery disease involving native coronary artery of native heart with unstable angina pectoris (HCC)   . Non-ST elevation (NSTEMI) myocardial infarction (HCC) 08/20/2018  . Acute diastolic heart failure (HCC) 08/18/2018  . Lower urinary tract symptoms (LUTS) 07/06/2018  . Encounter for chronic pain management 02/16/2015  . Diabetic neuropathy (HCC) 02/16/2015  . Chronic right shoulder pain 08/19/2014  . Diabetic retinopathy (HCC) 08/30/2013  . Insomnia due to medical condition 07/30/2013  . CKD (chronic kidney disease) stage 3, GFR 30-59 ml/min (HCC) 03/05/2013  . DM (diabetes mellitus), type 2, uncontrolled (HCC) 10/24/2012  . Colon cancer screening 10/24/2012  . AC joint dislocation 07/26/2012  . Frozen shoulder 06/13/2012  . Tobacco abuse disorder   . HYPERTENSION, BENIGN 12/22/2008  . Hyperlipidemia 12/21/2008  . BACK PAIN, LUMBAR 10/16/2008   Discharge Diagnoses:   Patient Active Problem List   Diagnosis Date Noted  . 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 Coronary artery disease involving native coronary artery of native heart with unstable angina pectoris (HCC)   . Non-ST elevation (NSTEMI) myocardial infarction (HCC) 08/20/2018  . Acute diastolic heart failure (HCC) 08/18/2018  . Lower urinary tract symptoms (LUTS) 07/06/2018  . Encounter for chronic pain management 02/16/2015  . Diabetic neuropathy (HCC) 02/16/2015  . Chronic right shoulder pain 08/19/2014  . Diabetic retinopathy (HCC) 08/30/2013  . Insomnia due to medical condition 07/30/2013  . CKD (chronic kidney disease) stage 3, GFR 30-59 ml/min (HCC) 03/05/2013  . DM (diabetes mellitus), type 2, uncontrolled (HCC) 10/24/2012  . Colon cancer screening 10/24/2012  . AC joint dislocation  07/26/2012  . Frozen shoulder 06/13/2012  . Tobacco abuse disorder   . HYPERTENSION, BENIGN 12/22/2008  . Hyperlipidemia 12/21/2008  . BACK PAIN, LUMBAR 10/16/2008   Discharged Condition: good  History of Present Illness:  [name] is a 62 yo white male with PMH of hypertension, hyperlipidemia, diabetes mellitus, tobacco abuse  who was in his normal state of health until 09/08 when he had more shortness of breath than usual. He was unable to lie flat at night and most of his shortness of breath symptoms occurred at night. Upon thinking back, patient states has had shortness of breath and fatigue over last several weeks. He did have an episode of pain across his chest, but was doing heavy yard work and thought he may have pulled a muscle. He presented to [facility] ED for further evaluation and treatment.  EKG on admission showed sinus rhythm, LVH, and ST depression. Troponin I peaked at 0.68. He ruled in for a NSTEMI. In addition, BNP was 1.083 so he was given Lasix.  Echo showed LVEF 50-55% and no significant valvular stenosis. Cardiac catheterization done on 08/20/2018 showed moderately severe distal left main stenosis, critical heavily calcified mid RCA stenosis, and critical heavily calcified proximal Circumflex stenosis. A cardiothoracic consultation was requested.  Hospital Course:  He remained chest pain free during his hospitalization.  He was evaluated by Dr. Cornelius Moras who was in agreement the patient would benefit from coronary bypass grafting procedure. The risks and benefits of the procedure were explained to the patient and he was agreeable to proceed.  He was taken to the operating room on 08/23/2018.  He underwent CABG x 3 utilizing LIMA to LAD, RIMA to RCA, and  SVG to OM 3.  The patient also underwent endoscopic harvest of greater saphenous vein from right thigh.  He tolerated the procedure without difficulty and was taken to the SICU in stable condition.  He was extubated the evening of  surgery.  During his stay in the patient's chest tubes and arterial lines were removed without difficulty.  He was started on diuresis for volume overload.  He was maintaining Sinus Tachycardia and his Lopressor dose was titrated.  He was ambulating independently and was stable for transfer on 08/25/2018.  He continued to make progress.  He remains mildly tachycardic.  His pacing wires have been removed without difficulty.  He has been started on Plavix for ACS.  His blood sugars have been erratic.  He is poorly controlled prior to admission.  He will remain on insulin at discharge, but due to an elevated creatinine level of 1.40 we will hold off on starting Metformin at this time.  He will require close follow upw ith primary physician as good diabetes control will be imperative for longevity of his bypass grafts.  His weight is back to baseline, and his lasix has been discontinued.  He has expected post operative blood loss anemia, without evidence of bleeding.  He will be given iron at discharge.  He continues to ambulate independently.  His incisions are healing without evidence of infection.  He is medically stable for discharge home today.   Significant Diagnostic Studies: angiography:    Mid LM to Dist LM lesion is 60% stenosed.  Ost LAD to Prox LAD lesion is 30% stenosed.  Prox LAD to Mid LAD lesion is 30% stenosed.  Ost Cx to Prox Cx lesion is 99% stenosed.  Prox RCA to Mid RCA lesion is 95% stenosed.   1.  Moderately severe distal left main stenosis with positive FFR of 0.75 2.  Critical heavily calcified mid RCA stenosis 3.  Critical heavily calcified proximal circumflex stenosis 4.  Nonobstructive LAD stenosis 5.  Mild segmental LV dysfunction with inferolateral hypokinesis and LVEF 50 to 55% by echo assessment 6.  Normal LVEDP  Treatments: surgery:    Coronary Artery Bypass Grafting x 3              Left Internal Mammary Artery to Distal Left Anterior Descending Coronary  Artery             Right Internal Mammary Artery to Distal Right Coronary Artery             Saphenous Vein Graft to Third Obtuse Marginal Branch of Left Circumflex Coronary Artery             Endoscopic Vein Harvest from Right Thigh  Discharge Exam: Blood pressure 127/71, pulse 86, temperature 98.1 F (36.7 C), temperature source Oral, resp. rate 20, height 5\' 11"  (1.803 m), weight 71.5 kg, SpO2 100 %.  General appearance: alert, cooperative and no distress Heart: regular rate and rhythm and tachy Lungs: clear to auscultation bilaterally Abdomen: soft, non-tender; bowel sounds normal; no masses,  no organomegaly Extremities: edema minimal  Wound: clean and dry  Disposition: Home  Discharge Medications:  The patient has been discharged on:   1.Beta Blocker:  Yes [ x  ]                              No   [   ]  If No, reason:  2.Ace Inhibitor/ARB: Yes [ x  ]                                     No  [    ]                                     If No, reason:  3.Statin:   Yes [ x  ]                  No  [   ]                  If No, reason:  4.Ecasa:  Yes  [  x ]                  No   [   ]                  If No, reason:      Allergies as of 08/28/2018      Reactions   Amitriptyline Other (See Comments)   Urinary retention   Carvedilol Diarrhea, Other (See Comments)   GI discomfort and hip pain   Cyclobenzaprine Other (See Comments)   Made patient "restleness, twitchy"    Nortriptyline Hcl Other (See Comments)   Vivid / bad dreams   Simvastatin Other (See Comments)   Arthralgias      Medication List    STOP taking these medications   amLODipine 10 MG tablet Commonly known as:  NORVASC   HYDROcodone-acetaminophen 10-325 MG tablet Commonly known as:  NORCO   metFORMIN 1000 MG tablet Commonly known as:  GLUCOPHAGE     TAKE these medications   acetaminophen 500 MG tablet Commonly known as:  TYLENOL Take 2 tablets (1,000 mg  total) by mouth every 6 (six) hours as needed for mild pain.   aspirin 81 MG tablet Take 81 mg by mouth daily.   BIOFREEZE ROLL-ON 4 % Gel Generic drug:  Menthol (Topical Analgesic) Apply 1 application topically as needed (shoulder pain).   clopidogrel 75 MG tablet Commonly known as:  PLAVIX Take 1 tablet (75 mg total) by mouth daily.   ferrous sulfate 325 (65 FE) MG EC tablet Take 1 tablet (325 mg total) by mouth 2 (two) times daily.   insulin aspart 100 UNIT/ML injection Commonly known as:  novoLOG Inject 3-7 Units into the skin 2 (two) times daily with a meal. 4-5 units BKFST, 4-7 units with dinner   insulin degludec 100 UNIT/ML Sopn FlexTouch Pen Commonly known as:  TRESIBA Inject 0.12 mLs (12 Units total) into the skin every morning.   lisinopril 5 MG tablet Commonly known as:  PRINIVIL,ZESTRIL Take 1 tablet (5 mg total) by mouth daily. What changed:    medication strength  how much to take   metoprolol tartrate 25 MG tablet Commonly known as:  LOPRESSOR Take 1 tablet (25 mg total) by mouth 2 (two) times daily.   Oxycodone HCl 10 MG Tabs Take 1 tablet (10 mg total) by mouth every 4 (four) hours as needed for severe pain.   pravastatin 20 MG tablet Commonly known as:  PRAVACHOL Take 1 tablet (20 mg total) by mouth 3 (three) times a week.   ranitidine 75 MG tablet Commonly known as:  ZANTAC Take 150 mg by mouth daily as needed.   tamsulosin 0.4 MG Caps capsule Commonly known as:  FLOMAX Take 1 capsule (0.4 mg total) by mouth daily.      Follow-up Information    Purcell Nails, MD Follow up on 09/24/2018.   Specialty:  Cardiothoracic Surgery Why:  Appointment is at 3:00, please get CXR at 2:30 at [facility] located on first floor of our office building Contact information: [address] [place] [address] [phone]        Azalee Course, [place] Follow up on 09/11/2018.   Specialties:  Cardiology, Radiology Why:  Appointment is  at 9:30 Contact information: [address] [place] [address] [phone]        [name], DO Follow up.   Specialty:  Family Medicine Why:  Please set up an appointment for 1 week for repeat BMET and management of DM Contact information: [address] [place] [address] [phone]        Lewayne Bunting, MD .   Specialty:  Cardiology Contact information: [address] [place] [address] [phone]           Signed: Lowella Dandy 08/28/2018, 8:39 AM

## 2018-08-23 NOTE — Anesthesia Procedure Notes (Signed)
Procedure Name: Intubation Date/Time: 08/23/2018 7:58 AM Performed by: Julian ReilWelty, Randolf Sansoucie F, CRNA Pre-anesthesia Checklist: Patient identified, Emergency Drugs available, Suction available and Patient being monitored Patient Re-evaluated:Patient Re-evaluated prior to induction Oxygen Delivery Method: Circle system utilized Preoxygenation: Pre-oxygenation with 100% oxygen Induction Type: IV induction Ventilation: Mask ventilation without difficulty Laryngoscope Size: Miller and 3 Grade View: Grade II Tube type: Oral Tube size: 8.0 mm Number of attempts: 2 Airway Equipment and Method: Stylet Placement Confirmation: ETT inserted through vocal cords under direct vision,  positive ETCO2 and breath sounds checked- equal and bilateral Secured at: 24 cm Tube secured with: Tape Dental Injury: Teeth and Oropharynx as per pre-operative assessment  Comments: In Short-Stay noted very very poor dentition.  Many missing, numerous chipped or broken off. None are loose per patient report.

## 2018-08-23 NOTE — Procedures (Signed)
Extubation Procedure Note  Patient Details:   Name: Colin Keller DOB: 10/23/1956 MRN: [id number]   Airway Documentation:    Vent end date: 08/23/18 Vent end time: 1611   Evaluation  O2 sats: stable throughout Complications: No apparent complications Patient did tolerate procedure well. Bilateral Breath Sounds: Diminished, Clear   Yes  Pt placed on 3 lpm Nasal cannula, Incentive Spirometer instructed  Florinda Taflinger F Attilio Zeitler 08/23/2018, 5:11 PM

## 2018-08-23 NOTE — Transfer of Care (Signed)
Immediate Anesthesia Transfer of Care Note  Patient: Colin KaufmanDavid W Moynahan  Procedure(s) Performed: CORONARY ARTERY BYPASS GRAFTING (CABG) x 3; -Left Internal Mammary Artery to Left Anterior Descending Artery, -Right Internal Mammary Artery to Right Coronary Artery, -Saphenous Vein Graft to Obtuse Marginal;  ENDOSCOPIC HARVEST GREATER SAPHENOUS VEIN  -Right Thigh (N/A Chest) TRANSESOPHAGEAL ECHOCARDIOGRAM (TEE) (N/A )  Patient Location: ICU  Anesthesia Type:General  Level of Consciousness: sedated, unresponsive and Patient remains intubated per anesthesia plan  Airway & Oxygen Therapy: Patient remains intubated per anesthesia plan and Patient placed on Ventilator (see vital sign flow sheet for setting)  Post-op Assessment: Report given to RN and Post -op Vital signs reviewed and stable  Post vital signs: Reviewed and stable  Last Vitals:  Vitals Value Taken Time  BP    Temp    Pulse 78 08/23/2018 12:29 PM  Resp 16 08/23/2018 12:29 PM  SpO2 100 % 08/23/2018 12:29 PM  Vitals shown include unvalidated device data.  Last Pain:  Vitals:   08/23/18 0512  TempSrc: Oral  PainSc:       Patients Stated Pain Goal: 0 (08/19/18 1226)  Complications: No apparent anesthesia complications

## 2018-08-23 NOTE — Progress Notes (Signed)
      [address]       [place] [id number]             [phone]      S/p CABG x 3  Extubated  BP 112/75   Pulse 83   Temp 98.8 F (37.1 C)   Resp (!) 30   Ht 5\' 11"  (1.803 m)   Wt 67.9 kg   SpO2 96%   BMI 20.89 kg/m   Intake/Output Summary (Last 24 hours) at 08/23/2018 1817 Last data filed at 08/23/2018 1500 Gross per 24 hour  Intake 3944.25 ml  Output 1425 ml  Net 2519.25 ml   CI= 2.4 Minimal CT output  Doing well post CABG  Viviann SpareSteven C. Dorris FetchHendrickson, MD [facility] [phone]

## 2018-08-23 NOTE — Progress Notes (Signed)
      [address]       [place] [id number]             [phone]     CARDIOTHORACIC SURGERY PROGRESS NOTE  Subjective: Colin KaufmanDavid W Keller has been scheduled for Procedure(s): CORONARY ARTERY BYPASS GRAFTING (CABG) (N/A) TRANSESOPHAGEAL ECHOCARDIOGRAM (TEE) (N/A) today.   Objective: Vital signs in last 24 hours: Temp:  [98.2 F (36.8 C)-98.3 F (36.8 C)] 98.3 F (36.8 C) (09/12 0512) Pulse Rate:  [65-71] 71 (09/12 0735) Cardiac Rhythm: Normal sinus rhythm (09/11 1900) Resp:  [12-21] 21 (09/12 0735) BP: (112-139)/(58-81) 112/58 (09/12 0730) SpO2:  [99 %-100 %] 100 % (09/12 0735) Weight:  [67.9 kg] 67.9 kg (09/12 0512)  Physical Exam: Unchanged from previously   Intake/Output from previous day: 09/11 0701 - 09/12 0700 In: 543 [P.O.:540; I.V.:3] Out: 1200 [Urine:1200] Intake/Output this shift: No intake/output data recorded.  Lab Results: Recent Labs    08/22/18 0433 08/23/18 0415  WBC 8.8 7.8  HGB 9.6* 9.9*  HCT 29.5* 30.8*  PLT 276 257   BMET:  Recent Labs    08/22/18 0433 08/23/18 0415  NA 137 138  K 4.1 4.3  CL 103 107  CO2 22 24  GLUCOSE 265* 175*  BUN 23 23  CREATININE 1.29* 1.26*  CALCIUM 8.6* 9.1    CBG (last 3)  Recent Labs    08/22/18 1636 08/22/18 1721 08/22/18 2114  GLUCAP 447* 400* 126*   PT/INR:  No results for input(s): LABPROT, INR in the last 72 hours.  Assessment/Plan:   The various methods of treatment have been discussed with the patient. After consideration of the risks, benefits and treatment options the patient has consented to the planned procedure.   The patient has been seen and labs reviewed. There are no changes in the patient's condition to prevent proceeding with the planned procedure today.   Purcell Nailslarence H Owen, MD 08/23/2018 7:35 AM

## 2018-08-23 NOTE — Brief Op Note (Signed)
08/18/2018 - 08/23/2018  11:58 AM  PATIENT:  Colin Keller  62 y.o. male  PRE-OPERATIVE DIAGNOSIS:  CAD  POST-OPERATIVE DIAGNOSIS:  CAD  PROCEDURE:  Procedure(s):  CORONARY ARTERY BYPASS GRAFTING x 3 -Left Internal Mammary Artery to Left Anterior Descending Artery -Right Internal Mammary Artery to Right Coronary Artery -Saphenous Vein Graft to Obtuse Marginal  ENDOSCOPIC HARVEST GREATER SAPHENOUS VEIN -Right Thigh  TRANSESOPHAGEAL ECHOCARDIOGRAM (TEE) (N/A)  SURGEON:  Surgeon(s) and Role:    Purcell Nails* Owen, Clarence H, MD - Primary  PHYSICIAN ASSISTANT: Lowella DandyErin Barrett PA-C  ANESTHESIA:   general  EBL:  550 mL   BLOOD ADMINISTERED: CELLSAVER  DRAINS: Left and Right Chest Tubes, Mediastinal Chest Drains   LOCAL MEDICATIONS USED:  NONE  SPECIMEN:  No Specimen  DISPOSITION OF SPECIMEN:  N/A  COUNTS:  YES  TOURNIQUET:  * No tourniquets in log *  DICTATION: .Dragon Dictation  PLAN OF CARE: Admit to inpatient   PATIENT DISPOSITION:  ICU - intubated and hemodynamically stable.   Delay start of Pharmacological VTE agent (>24hrs) due to surgical blood loss or risk of bleeding: yes

## 2018-08-23 NOTE — Progress Notes (Signed)
  Echocardiogram Echocardiogram Transesophageal has been performed.  Belva ChimesWendy  Lenorris Karger 08/23/2018, 8:46 AM

## 2018-08-23 NOTE — Op Note (Signed)
CARDIOTHORACIC SURGERY OPERATIVE NOTE  Date of Procedure: 08/23/2018  Preoperative Diagnosis:   Severe Left Main and 3-vessel Coronary Artery Disease  S/P Acute Non-ST Segment Elevation Myocardial Infarction  Postoperative Diagnosis: Same  Procedure:    Coronary Artery Bypass Grafting x 3   Left Internal Mammary Artery to Distal Left Anterior Descending Coronary Artery  Right Internal Mammary Artery to Distal Right Coronary Artery  Saphenous Vein Graft to Third Obtuse Marginal Branch of Left Circumflex Coronary Artery  Endoscopic Vein Harvest from Right Thigh  Surgeon: Salvatore Decent. Cornelius Moras, MD  Assistant: Lowella Dandy, PA-C  Anesthesia: Karna Christmas, MD  Operative Findings:  Normal left ventricular systolic function  Good quality left and right internal mammary artery conduit  Good quality saphenous vein conduit  Good quality target vessels for grafting    BRIEF CLINICAL NOTE AND INDICATIONS FOR SURGERY  Patient is a 62 year old male with no previous history of coronary artery disease but multiple risk factors including hypertension, poorly controlled insulin-dependent type 2 diabetes mellitus with multiple complications, hyperlipidemia, and long-standing tobacco abuse who states that he has been feeling poorly for quite some time and recently developed rapidly progressive symptoms of shortness of breath and tightness across his upper chest.  He was hospitalized and diagnosed with acute diastolic congestive heart failure and ruled in for a non-ST segment elevation myocardial infarction based on serial cardiac enzymes.  Symptoms have improved rapidly on medical therapy and the patient has not had any episodes of chest pain or chest tightness in the hospital.  Echocardiogram demonstrates mild left ventricular systolic dysfunction with severe hypokinesis of the basal portion of the inferior wall and lateral wall of the left ventricle.  Ejection fraction is estimated 50 to 55%.   There is no significant valvular disease.  Diagnostic cardiac catheterization performed by Dr. Excell Seltzer demonstrates tubular 60 to 75% stenosis of the left main coronary artery with critical high-grade stenosis involving left circumflex coronary artery in the proximal right coronary artery.  Coronary anatomy is relatively unfavorable for percutaneous coronary intervention, and the left main coronary artery stenosis was proven to be flow-limiting using FFR analysis.  The patient has been seen in consultation and counseled at length regarding the indications, risks and potential benefits of surgery.  All questions have been answered, and the patient provides full informed consent for the operation as described.    DETAILS OF THE OPERATIVE PROCEDURE  Preparation:  The patient is brought to the operating room on the above mentioned date and central monitoring was established by the anesthesia team including placement of Swan-Ganz catheter and radial arterial line. The patient is placed in the supine position on the operating table.  Intravenous antibiotics are administered. General endotracheal anesthesia is induced uneventfully. A Foley catheter is placed.  Baseline transesophageal echocardiogram was performed.  Findings were notable for normal left ventricular systolic function with trace central mitral regurgitation.  The patient's chest, abdomen, both groins, and both lower extremities are prepared and draped in a sterile manner. A time out procedure is performed.   Surgical Approach and Conduit Harvest:  A median sternotomy incision was performed and the left and right internal mammary arteries were dissected from the chest wall and prepared for bypass grafting. Both internal mammary arteries are notably good quality conduit. Simultaneously, the greater saphenous vein is obtained from the patient's right thigh using endoscopic vein harvest technique. The saphenous vein is notably good quality  conduit. After removal of the saphenous vein, the small surgical incisions in the lower  extremity are closed with absorbable suture. Following systemic heparinization, the left internal mammary artery was transected distally noted to have excellent flow.   Extracorporeal Cardiopulmonary Bypass and Myocardial Protection:  The pericardium is opened. The ascending aorta is normal in appearance. The ascending aorta and the right atrium are cannulated for cardiopulmonary bypass.  Adequate heparinization is verified.     The entire pre-bypass portion of the operation was notable for stable hemodynamics.  Cardiopulmonary bypass was begun and the surface of the heart is inspected. Distal target vessels are selected for coronary artery bypass grafting. A cardioplegia cannula is placed in the ascending aorta.  A temperature probe was placed in the interventricular septum.  The patient is allowed to cool passively to [facility] systemic temperature.  The aortic cross clamp is applied and cold blood cardioplegia is delivered initially in an antegrade fashion through the aortic root.  Iced saline slush is applied for topical hypothermia.  The initial cardioplegic arrest is rapid with early diastolic arrest.  Repeat doses of cardioplegia are administered intermittently throughout the entire cross clamp portion of the operation through the aortic root and through subsequently placed vein grafts in order to maintain completely flat electrocardiogram and septal myocardial temperature below 15C.  Myocardial protection was felt to be excellent.  Coronary Artery Bypass Grafting:   The third obtuse marginal branch of the left circumflex coronary artery was grafted using a reversed saphenous vein graft in an end-to-side fashion.  At the site of distal anastomosis the target vessel was fair quality and measured approximately 1.0 mm in diameter.  The distal right coronary artery was grafted with the right internal mammary  artery in an end-to-side fashion.  At the site of distal anastomosis the target vessel was good quality and measured approximately 2.2 mm in diameter.  The distal left anterior descending coronary artery was grafted with the left internal mammary artery in an end-to-side fashion.  At the site of distal anastomosis the target vessel was good quality and measured approximately 2.0 mm in diameter.  All proximal vein graft anastomoses were placed directly to the ascending aorta prior to removal of the aortic cross clamp.  The septal myocardial temperature rose rapidly after reperfusion of the left internal mammary artery graft.  The aortic cross clamp was removed after a total cross clamp time of 57 minutes.   Procedure Completion:  All proximal and distal coronary anastomoses were inspected for hemostasis and appropriate graft orientation. Epicardial pacing wires are fixed to the right ventricular outflow tract and to the right atrial appendage. The patient is rewarmed to 37C temperature. The patient is weaned and disconnected from cardiopulmonary bypass.  The patient's rhythm at separation from bypass was sinus.  The patient was weaned from cardiopulmonary bypass without any inotropic support. Total cardiopulmonary bypass time for the operation was 76 minutes.  Followup transesophageal echocardiogram performed after separation from bypass revealed no changes from the preoperative exam.  The aortic and venous cannula were removed uneventfully. Protamine was administered to reverse the anticoagulation. The mediastinum and pleural space were inspected for hemostasis and irrigated with saline solution. The mediastinum and both pleural spaces were drained using 4 chest tubes placed through separate stab incisions inferiorly.  The soft tissues anterior to the aorta were reapproximated loosely. The sternum is closed with double strength sternal wire. The soft tissues anterior to the sternum were closed in  multiple layers and the skin is closed with a running subcuticular skin closure.  The post-bypass portion of the operation  was notable for stable rhythm and hemodynamics.  No blood products were administered during the operation.   Disposition:  The patient tolerated the procedure well and is transported to the surgical intensive care in stable condition. There are no intraoperative complications. All sponge instrument and needle counts are verified correct at completion of the operation.    Salvatore Decentlarence H. Cornelius Moraswen MD 08/23/2018 11:58 AM

## 2018-08-23 NOTE — Anesthesia Procedure Notes (Signed)
Arterial Line Insertion Start/End9/11/2018 6:45 AM, 08/23/2018 6:55 AM Performed by: Adair LaundryPaxton, Lynn A, CRNA, CRNA  Patient location: Pre-op. Preanesthetic checklist: patient identified, IV checked, site marked, risks and benefits discussed, surgical consent, monitors and equipment checked, pre-op evaluation and timeout performed Lidocaine 1% used for infiltration and patient sedated Left, radial was placed Catheter size: 20 G Hand hygiene performed  and maximum sterile barriers used   Attempts: 2 Procedure performed without using ultrasound guided technique. Following insertion, dressing applied and Biopatch. Post procedure assessment: normal  Patient tolerated the procedure well with no immediate complications. Additional procedure comments: Jordan Pardini CRNA 1st attempt at left radial arterial line, unable to advance radial catheter.  [facility] with success..Marland Kitchen

## 2018-08-23 NOTE — Anesthesia Procedure Notes (Signed)
Central Venous Catheter Insertion Performed by: Arta Brucessey, Sade Hollon, MD, anesthesiologist Start/End9/11/2018 6:35 AM, 08/23/2018 6:50 AM Patient location: Pre-op. Preanesthetic checklist: patient identified, IV checked, risks and benefits discussed, surgical consent, monitors and equipment checked, pre-op evaluation, timeout performed and anesthesia consent Position: Trendelenburg Lidocaine 1% used for infiltration and patient sedated Hand hygiene performed  and maximum sterile barriers used  Catheter size: 8.5 Fr PA cath was placed.Sheath introducer Swan type:thermodilution Procedure performed using ultrasound guided technique. Ultrasound Notes:anatomy identified, needle tip was noted to be adjacent to the nerve/plexus identified, no ultrasound evidence of intravascular and/or intraneural injection and image(s) printed for medical record Attempts: 1 Following insertion, line sutured, dressing applied and Biopatch. Post procedure assessment: blood return through all ports, free fluid flow and no air  Patient tolerated the procedure well with no immediate complications.

## 2018-08-24 ENCOUNTER — Inpatient Hospital Stay (HOSPITAL_COMMUNITY): Payer: Self-pay

## 2018-08-24 ENCOUNTER — Encounter (HOSPITAL_COMMUNITY): Payer: Self-pay | Admitting: Thoracic Surgery (Cardiothoracic Vascular Surgery)

## 2018-08-24 DIAGNOSIS — Z951 Presence of aortocoronary bypass graft: Secondary | ICD-10-CM

## 2018-08-24 DIAGNOSIS — E785 Hyperlipidemia, unspecified: Secondary | ICD-10-CM

## 2018-08-24 LAB — CBC
HEMATOCRIT: 25 % — AB (ref 39.0–52.0)
HEMATOCRIT: 26.8 % — AB (ref 39.0–52.0)
HEMOGLOBIN: 8.6 g/dL — AB (ref 13.0–17.0)
Hemoglobin: 8 g/dL — ABNORMAL LOW (ref 13.0–17.0)
MCH: 32.1 pg (ref 26.0–34.0)
MCH: 32.5 pg (ref 26.0–34.0)
MCHC: 32 g/dL (ref 30.0–36.0)
MCHC: 32.1 g/dL (ref 30.0–36.0)
MCV: 100.4 fL — ABNORMAL HIGH (ref 78.0–100.0)
MCV: 101.1 fL — ABNORMAL HIGH (ref 78.0–100.0)
PLATELETS: 149 10*3/uL — AB (ref 150–400)
Platelets: 151 10*3/uL (ref 150–400)
RBC: 2.49 MIL/uL — AB (ref 4.22–5.81)
RBC: 2.65 MIL/uL — AB (ref 4.22–5.81)
RDW: 13.4 % (ref 11.5–15.5)
RDW: 13.7 % (ref 11.5–15.5)
WBC: 10.6 10*3/uL — AB (ref 4.0–10.5)
WBC: 12.4 10*3/uL — AB (ref 4.0–10.5)

## 2018-08-24 LAB — GLUCOSE, CAPILLARY
GLUCOSE-CAPILLARY: 120 mg/dL — AB (ref 70–99)
GLUCOSE-CAPILLARY: 133 mg/dL — AB (ref 70–99)
GLUCOSE-CAPILLARY: 146 mg/dL — AB (ref 70–99)
GLUCOSE-CAPILLARY: 196 mg/dL — AB (ref 70–99)
GLUCOSE-CAPILLARY: 225 mg/dL — AB (ref 70–99)
GLUCOSE-CAPILLARY: 231 mg/dL — AB (ref 70–99)
Glucose-Capillary: 130 mg/dL — ABNORMAL HIGH (ref 70–99)
Glucose-Capillary: 111 mg/dL — ABNORMAL HIGH (ref 70–99)
Glucose-Capillary: 121 mg/dL — ABNORMAL HIGH (ref 70–99)
Glucose-Capillary: 115 mg/dL — ABNORMAL HIGH (ref 70–99)
Glucose-Capillary: 129 mg/dL — ABNORMAL HIGH (ref 70–99)
Glucose-Capillary: 130 mg/dL — ABNORMAL HIGH (ref 70–99)
Glucose-Capillary: 114 mg/dL — ABNORMAL HIGH (ref 70–99)
Glucose-Capillary: 240 mg/dL — ABNORMAL HIGH (ref 70–99)
Glucose-Capillary: 257 mg/dL — ABNORMAL HIGH (ref 70–99)
Glucose-Capillary: 212 mg/dL — ABNORMAL HIGH (ref 70–99)
Glucose-Capillary: 103 mg/dL — ABNORMAL HIGH (ref 70–99)
Glucose-Capillary: 102 mg/dL — ABNORMAL HIGH (ref 70–99)
Glucose-Capillary: 153 mg/dL — ABNORMAL HIGH (ref 70–99)
Glucose-Capillary: 73 mg/dL (ref 70–99)

## 2018-08-24 LAB — CREATININE, SERUM
Creatinine, Ser: 1.59 mg/dL — ABNORMAL HIGH (ref 0.61–1.24)
GFR, EST AFRICAN AMERICAN: 51 mL/min — AB (ref >60)
GFR, EST NON AFRICAN AMERICAN: 44 mL/min — AB (ref >60)

## 2018-08-24 LAB — BASIC METABOLIC PANEL
ANION GAP: 12 (ref 5–15)
BUN: 23 mg/dL (ref 8–23)
CO2: 18 mmol/L — ABNORMAL LOW (ref 22–32)
Calcium: 8 mg/dL — ABNORMAL LOW (ref 8.9–10.3)
Chloride: 109 mmol/L (ref 98–111)
Creatinine, Ser: 1.34 mg/dL — ABNORMAL HIGH (ref 0.61–1.24)
GFR calc non Af Amer: 54 mL/min — ABNORMAL LOW (ref >60)
Glucose, Bld: 200 mg/dL — ABNORMAL HIGH (ref 70–99)
POTASSIUM: 5.1 mmol/L (ref 3.5–5.1)
Sodium: 139 mmol/L (ref 135–145)

## 2018-08-24 LAB — MAGNESIUM
MAGNESIUM: 2.7 mg/dL — AB (ref 1.7–2.4)
Magnesium: 2.6 mg/dL — ABNORMAL HIGH (ref 1.7–2.4)

## 2018-08-24 MED ORDER — INSULIN ASPART 100 UNIT/ML ~~LOC~~ SOLN
0.0000 [IU] | SUBCUTANEOUS | Status: DC
  Administered 2018-08-24: 8 [IU] via SUBCUTANEOUS

## 2018-08-24 MED ORDER — INSULIN GLARGINE 100 UNIT/ML ~~LOC~~ SOLN
12.0000 [IU] | Freq: Every day | SUBCUTANEOUS | Status: DC
  Administered 2018-08-24: 12 [IU] via SUBCUTANEOUS
  Filled 2018-08-24 (×2): qty 0.12

## 2018-08-24 MED ORDER — MORPHINE SULFATE (PF) 2 MG/ML IV SOLN
1.0000 mg | INTRAVENOUS | Status: DC | PRN
  Administered 2018-08-24: 2 mg via INTRAVENOUS
  Filled 2018-08-24: qty 1

## 2018-08-24 MED ORDER — ENOXAPARIN SODIUM 40 MG/0.4ML ~~LOC~~ SOLN
40.0000 mg | Freq: Every day | SUBCUTANEOUS | Status: DC
  Administered 2018-08-24 – 2018-08-27 (×4): 40 mg via SUBCUTANEOUS
  Filled 2018-08-24 (×4): qty 0.4

## 2018-08-24 MED FILL — Heparin Sodium (Porcine) Inj 1000 Unit/ML: INTRAMUSCULAR | Qty: 30 | Status: AC

## 2018-08-24 MED FILL — Electrolyte-R (PH 7.4) Solution: INTRAVENOUS | Qty: 3000 | Status: AC

## 2018-08-24 MED FILL — Sodium Chloride IV Soln 0.9%: INTRAVENOUS | Qty: 2000 | Status: AC

## 2018-08-24 MED FILL — Dexmedetomidine HCl in NaCl 0.9% IV Soln 400 MCG/100ML: INTRAVENOUS | Qty: 100 | Status: AC

## 2018-08-24 MED FILL — Heparin Sodium (Porcine) Inj 1000 Unit/ML: INTRAMUSCULAR | Qty: 10 | Status: AC

## 2018-08-24 MED FILL — Potassium Chloride Inj 2 mEq/ML: INTRAVENOUS | Qty: 40 | Status: AC

## 2018-08-24 MED FILL — Sodium Bicarbonate IV Soln 8.4%: INTRAVENOUS | Qty: 50 | Status: AC

## 2018-08-24 MED FILL — Magnesium Sulfate Inj 50%: INTRAMUSCULAR | Qty: 10 | Status: AC

## 2018-08-24 MED FILL — Lidocaine HCl(Cardiac) IV PF Soln Pref Syr 100 MG/5ML (2%): INTRAVENOUS | Qty: 5 | Status: AC

## 2018-08-24 NOTE — Progress Notes (Signed)
Inpatient Diabetes Program Recommendations  AACE/ADA: New Consensus Statement on Inpatient Glycemic Control (2015)  Target Ranges:  Prepandial:   less than 140 mg/dL      Peak postprandial:   less than 180 mg/dL (1-2 hours)      Critically ill patients:  140 - 180 mg/dL   Lab Results  Component Value Date   GLUCAP 240 (H) 08/24/2018   HGBA1C 9.5 (H) 08/18/2018    Review of Glycemic Control  Diabetes history: Type 2 Outpatient Diabetes medications: Tresiba 12 units daily, Novolog 3 units breakfast, Novolog 3 units lunch, and Novolog 7 units with supper Current orders for Inpatient glycemic control: IV insulin drip; transition orders for Novolog 0-24 units every 4 hours.  Inpatient Diabetes Program Recommendations:    Recommend adding Lantus 12 units daily at transition off IV insulin drip along with the Novolog 0-24 units every 4 hours.   Colin MinceKendra Safiya Girdler RN BSN CDE Diabetes Coordinator Pager: [phone]  8am-5pm

## 2018-08-24 NOTE — Progress Notes (Signed)
Progress Note  Patient Name: Colin Keller Date of Encounter: 08/24/2018  Primary Cardiologist: Olga Millers, MD   Subjective   Doing well, sitting in chair. Passing flatus, no BM yet. Chest tubes are uncomfortable but otherwise no complaints.  Inpatient Medications    Scheduled Meds: . acetaminophen  1,000 mg Oral Q6H  . [START ON 08/25/2018] aspirin EC  81 mg Oral Daily  . bisacodyl  10 mg Oral Daily   Or  . bisacodyl  10 mg Rectal Daily  . Chlorhexidine Gluconate Cloth  6 each Topical Daily  . [START ON 08/25/2018] clopidogrel  75 mg Oral Daily  . docusate sodium  200 mg Oral Daily  . enoxaparin (LOVENOX) injection  40 mg Subcutaneous QHS  . insulin aspart  0-24 Units Subcutaneous Q4H  . insulin glargine  12 Units Subcutaneous Daily  . insulin regular  0-10 Units Intravenous TID WC  . metoprolol tartrate  12.5 mg Oral BID  . [START ON 08/25/2018] pantoprazole  40 mg Oral Daily  . [START ON 08/27/2018] pravastatin  20 mg Oral Once per day on Mon Wed Fri  . sodium chloride flush  10-40 mL Intracatheter Q12H   Continuous Infusions: . sodium chloride 20 mL/hr at 08/24/18 0800  . sodium chloride Stopped (08/24/18 0546)  . sodium chloride 20 mL/hr at 08/24/18 0800  . cefUROXime (ZINACEF)  IV Stopped (08/24/18 0846)  . dexmedetomidine (PRECEDEX) IV infusion Stopped (08/24/18 0441)  . insulin (NOVOLIN-R) infusion 4.6 mL/hr at 08/24/18 1200  . lactated ringers    . lactated ringers    . lactated ringers    . nitroGLYCERIN Stopped (08/23/18 1224)  . phenylephrine (NEO-SYNEPHRINE) Adult infusion Stopped (08/24/18 0537)   PRN Meds: sodium chloride, lactated ringers, metoprolol tartrate, midazolam, morphine injection, ondansetron (ZOFRAN) IV, oxyCODONE, sodium chloride flush, traMADol   Vital Signs    Vitals:   08/24/18 1210 08/24/18 1300 08/24/18 1400 08/24/18 1526  BP:  (!) 88/72 103/64   Pulse:  81 80   Resp:  (!) 24 19   Temp: 98.6 F (37 C)   98.5 F (36.9 C)    TempSrc: Oral   Oral  SpO2:  96% 96%   Weight:      Height:        Intake/Output Summary (Last 24 hours) at 08/24/2018 1532 Last data filed at 08/24/2018 1434 Gross per 24 hour  Intake 2437.52 ml  Output 1290 ml  Net 1147.52 ml   Filed Weights   08/22/18 0504 08/23/18 0512 08/24/18 0500  Weight: 68.6 kg 67.9 kg 74.3 kg    Telemetry    Sinus - Personally Reviewed  Physical Exam   GEN: No acute distress.   Neck: No JVD. Cardiac: RRR, no murmurs, rubs, or gallops. 2+ bilateral radial and DP pulses. Chest tubes draining serosanguinous fluid Respiratory: Clear to auscultation bilaterally except for slight dullness at bilateral bases. GI: Soft, nontender, non-distended  MS: No edema Neuro:  Nonfocal  Psych: Normal affect   Labs    Chemistry Recent Labs  Lab 08/18/18 1707  08/22/18 0433 08/23/18 0415  08/23/18 1132 08/23/18 1234 08/23/18 1815 08/23/18 1820 08/24/18 0411  NA  --    < > 137 138   < > 140 143 141  --  139  K  --    < > 4.1 4.3   < > 4.1 3.6 5.1  --  5.1  CL  --    < > 103 107   < >  107  --  110  --  109  CO2  --    < > 22 24  --   --   --   --   --  18*  GLUCOSE  --    < > 265* 175*   < > 179* 132* 121*  --  200*  BUN  --    < > 23 23   < > 22  --  21  --  23  CREATININE  --    < > 1.29* 1.26*   < > 0.90  --  1.20 1.19 1.34*  CALCIUM  --    < > 8.6* 9.1  --   --   --   --   --  8.0*  PROT 6.5  --  5.5*  --   --   --   --   --   --   --   ALBUMIN 3.5  --  3.1*  --   --   --   --   --   --   --   AST 21  --  20  --   --   --   --   --   --   --   ALT 22  --  16  --   --   --   --   --   --   --   ALKPHOS 75  --  61  --   --   --   --   --   --   --   BILITOT 1.0  --  0.6  --   --   --   --   --   --   --   GFRNONAA  --    < > 57* 59*  --   --   --   --  >60 54*  GFRAA  --    < > >60 >60  --   --   --   --  >60 >60  ANIONGAP  --    < > 12 7  --   --   --   --   --  12   < > = values in this interval not displayed.     Hematology Recent Labs  Lab  08/23/18 1222  08/23/18 1815 08/23/18 1820 08/24/18 0411  WBC 10.5  --   --  13.2* 10.6*  RBC 2.51*  --   --  2.85* 2.49*  HGB 8.0*   < > 9.2* 9.0* 8.0*  HCT 24.9*   < > 27.0* 28.2* 25.0*  MCV 99.2  --   --  98.9 100.4*  MCH 31.9  --   --  31.6 32.1  MCHC 32.1  --   --  31.9 32.0  RDW 13.0  --   --  13.1 13.4  PLT 159  --   --  198 149*   < > = values in this interval not displayed.    Cardiac Enzymes Recent Labs  Lab 08/18/18 1401 08/18/18 1707 08/18/18 2305 08/19/18 0603  TROPONINI 0.38* 0.32* 0.44* 0.68*    Recent Labs  Lab 08/18/18 1409  TROPIPOC 0.29*     BNP Recent Labs  Lab 08/18/18 1401  BNP 1,083.3*     Radiology    Dg Chest Port 1 View  Result Date: 08/24/2018 CLINICAL DATA:  Follow-up chest tube EXAM: PORTABLE CHEST 1 VIEW COMPARISON:  08/23/2018 FINDINGS: The endotracheal tube and nasogastric  catheter have been removed in the interval. Mediastinal drain and bilateral thoracostomy catheters are again seen and stable. Pericardial drain is stable as well. Theone Murdoch catheter is been removed slightly now lies in the pulmonary outflow tract. The lungs are hypoaerated compare with the prior exam with mild bibasilar atelectasis. No pneumothorax is seen. IMPRESSION: Tubes and lines as described. New bibasilar atelectatic changes without evidence of pneumothorax. Electronically Signed   By: Alcide Clever M.D.   On: 08/24/2018 07:39   Dg Chest Port 1 View  Result Date: 08/23/2018 CLINICAL DATA:  Followup CABG EXAM: PORTABLE CHEST 1 VIEW COMPARISON:  08/18/2018 FINDINGS: Interval median sternotomy and CABG. Endotracheal tube tip is 5 cm above the carina. Nasogastric tube enters the abdomen. Swan-Ganz catheter tip in the region of the pulmonary valve or proximal main pulmonary artery. Bilateral chest tubes in place. No pneumothorax. Skin fold on the left simulates pneumothorax. No pulmonary edema. Mild bibasilar atelectasis. IMPRESSION: No pneumothorax. Lines and tubes  well positioned, with the exception that the Swan-Ganz catheter tip is proximal, in the region of the pulmonary valve or proximal main pulmonary artery/pulmonary outflow tract. Electronically Signed   By: Paulina Fusi M.D.   On: 08/23/2018 12:52   CV studies: Echo 08/19/18 Study Conclusions  - Left ventricle: The cavity size was normal. There was mild   concentric hypertrophy. Systolic function was normal. The   estimated ejection fraction was in the range of 50% to 55%.   Hypokinesis of the basal-midinferolateral and inferior   myocardium. Doppler parameters are consistent with abnormal left   ventricular relaxation (grade 1 diastolic dysfunction). Doppler   parameters are consistent with indeterminate ventricular filling   pressure. - Aortic valve: Transvalvular velocity was within the normal range.   There was no stenosis. There was no regurgitation. Valve area   (VTI): 2.32 cm^2. Valve area (Vmax): 2.66 cm^2. Valve area   (Vmean): 2.66 cm^2. - Mitral valve: Moderately calcified annulus. Transvalvular   velocity was within the normal range. There was no evidence for   stenosis. There was trivial regurgitation. - Left atrium: The atrium was severely dilated. - Right ventricle: The cavity size was normal. Wall thickness was   normal. Systolic function was normal. - Atrial septum: No defect or patent foramen ovale was identified   by color flow Doppler. - Tricuspid valve: There was no regurgitation. - Pulmonary arteries: Systolic pressure was within the normal   range. PA peak pressure: 9 mm Hg (S).  Cath 08/20/18  Mid LM to Dist LM lesion is 60% stenosed.  Ost LAD to Prox LAD lesion is 30% stenosed.  Prox LAD to Mid LAD lesion is 30% stenosed.  Ost Cx to Prox Cx lesion is 99% stenosed.  Prox RCA to Mid RCA lesion is 95% stenosed.   1.  Moderately severe distal left main stenosis with positive FFR of 0.75 2.  Critical heavily calcified mid RCA stenosis 3.  Critical heavily  calcified proximal circumflex stenosis 4.  Nonobstructive LAD stenosis 5.  Mild segmental LV dysfunction with inferolateral hypokinesis and LVEF 50 to 55% by echo assessment 6.  Normal LVEDP  Recommendations: Surgical evaluation for consideration of CABG.  The patient will be restarted on heparin after his sheath is out.  Case discussed with Dr. Tyrone Sage. Pt stable/chest pain free on heparin.   Patient Profile     62 year old male with past medical history of diabetes mellitus, hypertension, hyperlipidemia, tobacco abuse with  non-ST elevation myocardial infarction and acute diastolic heart  failure.    Assessment & Plan    NSTEMI, CAD, now s/p CABG 9/12/19n (Dr. Cornelius Moras, LIMA-dLAD, RIMA-dRCA, SVG-OM3) -Continue aspirin, clopidogrel, and metoprolol.   -Continue low-dose pravastatin (patient did not tolerate high-dose statins in the past). -post CABG, should be on DAPT for 12 months given NSTEMI presentation -consider repatha/follow up lipid clinic  new onset diastolic heart failure- now euvolemic -normal LV EF on echo but focal WMA of the basal-midinferolateral and inferior walls. Grade 1 diastolic dysfunction.  chronic stage III kidney disease -holding ACE inhibitor  tobacco abuse -patient counseled on discontinuing. He is amenable.  hyperlipidemia -continue pravastatin; he did not tolerate high-dose statins in the past.  Follow-up in lipid clinic for consideration of Repatha.  hypertension -holding ACE inhibitor -continue metoprolol  For questions or updates, please contact [facility] Please consult [email or web address] for contact info under     Signed, Jodelle Red, MD  08/24/2018, 3:32 PM

## 2018-08-24 NOTE — Anesthesia Postprocedure Evaluation (Signed)
Anesthesia Post Note  Patient: Colin KaufmanDavid W Keller  Procedure(s) Performed: CORONARY ARTERY BYPASS GRAFTING (CABG) x 3; -Left Internal Mammary Artery to Left Anterior Descending Artery, -Right Internal Mammary Artery to Right Coronary Artery, -Saphenous Vein Graft to Obtuse Marginal;  ENDOSCOPIC HARVEST GREATER SAPHENOUS VEIN  -Right Thigh (N/A Chest) TRANSESOPHAGEAL ECHOCARDIOGRAM (TEE) (N/A )     Patient location during evaluation: ICU Anesthesia Type: General Level of consciousness: awake and alert Pain management: pain level controlled Vital Signs Assessment: post-procedure vital signs reviewed and stable Respiratory status: spontaneous breathing, nonlabored ventilation, respiratory function stable and patient connected to nasal cannula oxygen Cardiovascular status: blood pressure returned to baseline and stable Postop Assessment: no apparent nausea or vomiting Anesthetic complications: no    Last Vitals:  Vitals:   08/24/18 0600 08/24/18 0700  BP: 98/62 117/68  Pulse: 78 84  Resp: 19 (!) 27  Temp: 37.3 C 37.2 C  SpO2: 100% 98%    Last Pain:  Vitals:   08/24/18 0429  TempSrc:   PainSc: 4                  [name]

## 2018-08-24 NOTE — Plan of Care (Signed)
Patient alert and oriented, denies shortness of breath, rates pain 6/10 to 9/10 with inspiration.  Titrating gtts as charted.  Monitoring.

## 2018-08-24 NOTE — Progress Notes (Addendum)
[address]       [place] [id number]             [phone]      1 Day Post-Op Procedure(s) (LRB): CORONARY ARTERY BYPASS GRAFTING (CABG) x 3; -Left Internal Mammary Artery to Left Anterior Descending Artery, -Right Internal Mammary Artery to Right Coronary Artery, -Saphenous Vein Graft to Obtuse Marginal;  ENDOSCOPIC HARVEST GREATER SAPHENOUS VEIN  -Right Thigh (N/A) TRANSESOPHAGEAL ECHOCARDIOGRAM (TEE) (N/A)   Subjective:  Patient complains of pain.  States its constant and he is not getting relief.  He states he has only use IV pain medication.  Told patient he has oral agents available as well that may provide the additional relief he needs.  He denies N/V  Objective: Vital signs in last 24 hours: Temp:  [96.8 F (36 C)-99.7 F (37.6 C)] 99 F (37.2 C) (09/13 0700) Pulse Rate:  [78-93] 84 (09/13 0700) Cardiac Rhythm: Atrial paced (09/12 2200) Resp:  [12-37] 27 (09/13 0700) BP: (84-123)/(59-84) 117/68 (09/13 0700) SpO2:  [96 %-100 %] 98 % (09/13 0700) Arterial Line BP: (103-155)/(43-68) 155/54 (09/13 0700) FiO2 (%):  [40 %-50 %] 50 % (09/12 1530) Weight:  [74.3 kg] 74.3 kg (09/13 0500)  Hemodynamic parameters for last 24 hours: PAP: (21-42)/(1-26) 38/22 CO:  [3.4 L/min-5.3 L/min] 4.5 L/min CI:  [1.8 L/min/m2-2.9 L/min/m2] 2.4 L/min/m2  Intake/Output from previous day: 09/12 0701 - 09/13 0700 In: 5464.9 [I.V.:4439.4; Blood:230; IV Piggyback:795.6] Out: 2235 [Urine:915; Blood:550; Chest Tube:770]  General appearance: alert, cooperative and no distress Heart: regular rate and rhythm Lungs: diminished breath sounds bibasilar Abdomen: soft, non-tender; bowel sounds normal; no masses,  no organomegaly Extremities: edema trace Wound: clean and dry, aquacel in place on sternotomy  Lab Results: Recent Labs    08/23/18 1820 08/24/18 0411  WBC 13.2* 10.6*  HGB 9.0* 8.0*  HCT 28.2* 25.0*  PLT 198 149*   BMET:  Recent Labs    08/23/18 0415   08/23/18 1815 08/23/18 1820 08/24/18 0411  NA 138   < > 141  --  139  K 4.3   < > 5.1  --  5.1  CL 107   < > 110  --  109  CO2 24  --   --   --  18*  GLUCOSE 175*   < > 121*  --  200*  BUN 23   < > 21  --  23  CREATININE 1.26*   < > 1.20 1.19 1.34*  CALCIUM 9.1  --   --   --  8.0*   < > = values in this interval not displayed.    PT/INR:  Recent Labs    08/23/18 1222  LABPROT 15.4*  INR 1.23   ABG    Component Value Date/Time   PHART 7.307 (L) 08/23/2018 1726   HCO3 19.0 (L) 08/23/2018 1726   TCO2 23 08/23/2018 1815   ACIDBASEDEF 7.0 (H) 08/23/2018 1726   O2SAT 96.0 08/23/2018 1726   CBG (last 3)  Recent Labs    08/23/18 2222 08/23/18 2334 08/24/18 0555  GLUCAP 115* 133* 114*    Assessment/Plan: S/P Procedure(s) (LRB): CORONARY ARTERY BYPASS GRAFTING (CABG) x 3; -Left Internal Mammary Artery to Left Anterior Descending Artery, -Right Internal Mammary Artery to Right Coronary Artery, -Saphenous Vein Graft to Obtuse Marginal;  ENDOSCOPIC HARVEST GREATER SAPHENOUS VEIN  -Right Thigh (N/A) TRANSESOPHAGEAL ECHOCARDIOGRAM (TEE) (N/A)  1. CV- NSR, off all drips- start low dose Lopressor  today 2. Pulm- bibasilar atelectasis on CXR, patient not taking good deep breaths due to pain, encouraged him to brace pillow on chest and work on IS throughout the day and take good deep breaths as he can 3. Renal- creatinine WNL, weight is elevated, may benefit from IV lasix, later today 4. Expected post operative blood loss anemia, mild Hgb at 8.0, no acute signs of bleeding will monitor 5. Expected post operative Thrombocytopenia, mild at 149, monitor 6. Dispo- patient stable, in NSR, atelectasis on CXR encouraged good use of IS, may be able to diurese later today, leave chest tubes in place until tomorrow, POD #1 progression orders   LOS: 6 days    Lowella Dandy 08/24/2018 Having a lot more pain after getting back into bed. Minimal relief with oxycodone. Will give IV  morphine Minimal drainage from CT- will dc tubes, should help with pain  Viviann Spare C. Dorris Fetch, MD [facility] [phone]

## 2018-08-25 ENCOUNTER — Inpatient Hospital Stay (HOSPITAL_COMMUNITY): Payer: Self-pay

## 2018-08-25 LAB — GLUCOSE, CAPILLARY
GLUCOSE-CAPILLARY: 193 mg/dL — AB (ref 70–99)
GLUCOSE-CAPILLARY: 104 mg/dL — AB (ref 70–99)
GLUCOSE-CAPILLARY: 124 mg/dL — AB (ref 70–99)
GLUCOSE-CAPILLARY: 231 mg/dL — AB (ref 70–99)
Glucose-Capillary: 223 mg/dL — ABNORMAL HIGH (ref 70–99)
Glucose-Capillary: 153 mg/dL — ABNORMAL HIGH (ref 70–99)
Glucose-Capillary: 151 mg/dL — ABNORMAL HIGH (ref 70–99)
Glucose-Capillary: 106 mg/dL — ABNORMAL HIGH (ref 70–99)
Glucose-Capillary: 210 mg/dL — ABNORMAL HIGH (ref 70–99)

## 2018-08-25 LAB — CBC
HEMATOCRIT: 25 % — AB (ref 39.0–52.0)
Hemoglobin: 7.9 g/dL — ABNORMAL LOW (ref 13.0–17.0)
MCH: 32.1 pg (ref 26.0–34.0)
MCHC: 31.6 g/dL (ref 30.0–36.0)
MCV: 101.6 fL — AB (ref 78.0–100.0)
Platelets: 141 10*3/uL — ABNORMAL LOW (ref 150–400)
RBC: 2.46 MIL/uL — ABNORMAL LOW (ref 4.22–5.81)
RDW: 13.8 % (ref 11.5–15.5)
WBC: 12.5 10*3/uL — ABNORMAL HIGH (ref 4.0–10.5)

## 2018-08-25 LAB — BASIC METABOLIC PANEL
Anion gap: 9 (ref 5–15)
BUN: 30 mg/dL — AB (ref 8–23)
CHLORIDE: 107 mmol/L (ref 98–111)
CO2: 19 mmol/L — AB (ref 22–32)
Calcium: 8.1 mg/dL — ABNORMAL LOW (ref 8.9–10.3)
Creatinine, Ser: 1.55 mg/dL — ABNORMAL HIGH (ref 0.61–1.24)
GFR calc Af Amer: 53 mL/min — ABNORMAL LOW (ref >60)
GFR calc non Af Amer: 46 mL/min — ABNORMAL LOW (ref >60)
GLUCOSE: 214 mg/dL — AB (ref 70–99)
Potassium: 4.6 mmol/L (ref 3.5–5.1)
Sodium: 135 mmol/L (ref 135–145)

## 2018-08-25 MED ORDER — INSULIN ASPART 100 UNIT/ML ~~LOC~~ SOLN
0.0000 [IU] | Freq: Three times a day (TID) | SUBCUTANEOUS | Status: DC
  Administered 2018-08-25 (×2): 8 [IU] via SUBCUTANEOUS
  Administered 2018-08-26: 20 [IU] via SUBCUTANEOUS
  Administered 2018-08-26: 12 [IU] via SUBCUTANEOUS
  Administered 2018-08-26: 8 [IU] via SUBCUTANEOUS
  Administered 2018-08-27: 12 [IU] via SUBCUTANEOUS

## 2018-08-25 MED ORDER — SODIUM CHLORIDE 0.9 % IV SOLN: 250.0000 mL | INTRAVENOUS | Status: DC | PRN

## 2018-08-25 MED ORDER — FUROSEMIDE 40 MG PO TABS
40.0000 mg | ORAL_TABLET | Freq: Every day | ORAL | Status: DC
  Administered 2018-08-25 – 2018-08-27 (×3): 40 mg via ORAL
  Filled 2018-08-25 (×3): qty 1

## 2018-08-25 MED ORDER — SODIUM CHLORIDE 0.9% FLUSH: 3.0000 mL | INTRAVENOUS | Status: DC | PRN

## 2018-08-25 MED ORDER — SODIUM CHLORIDE 0.9% FLUSH
3.0000 mL | Freq: Two times a day (BID) | INTRAVENOUS | Status: DC
  Administered 2018-08-25 – 2018-08-27 (×5): 3 mL via INTRAVENOUS

## 2018-08-25 MED ORDER — MAGNESIUM HYDROXIDE 400 MG/5ML PO SUSP: 30.0000 mL | Freq: Every day | ORAL | Status: DC | PRN

## 2018-08-25 MED ORDER — INFLUENZA VAC SPLIT QUAD 0.5 ML IM SUSY
0.5000 mL | PREFILLED_SYRINGE | INTRAMUSCULAR | Status: AC
  Administered 2018-08-28: 0.5 mL via INTRAMUSCULAR
  Filled 2018-08-25: qty 0.5

## 2018-08-25 MED ORDER — TAMSULOSIN HCL 0.4 MG PO CAPS
0.4000 mg | ORAL_CAPSULE | Freq: Every day | ORAL | Status: DC
  Administered 2018-08-25 – 2018-08-28 (×4): 0.4 mg via ORAL
  Filled 2018-08-25 (×4): qty 1

## 2018-08-25 MED ORDER — MOVING RIGHT ALONG BOOK
Freq: Once | Status: AC
  Administered 2018-08-25: 15:00:00
  Filled 2018-08-25: qty 1

## 2018-08-25 MED ORDER — INSULIN DETEMIR 100 UNIT/ML ~~LOC~~ SOLN
14.0000 [IU] | Freq: Two times a day (BID) | SUBCUTANEOUS | Status: DC
  Administered 2018-08-25 – 2018-08-26 (×3): 14 [IU] via SUBCUTANEOUS
  Filled 2018-08-25 (×5): qty 0.14

## 2018-08-25 NOTE — Plan of Care (Signed)
  Problem: Clinical Measurements: Goal: Ability to maintain clinical measurements within normal limits will improve Outcome: Progressing Goal: Respiratory complications will improve Outcome: Progressing   Problem: Activity: Goal: Risk for activity intolerance will decrease Outcome: Progressing   Problem: Coping: Goal: Level of anxiety will decrease Outcome: Progressing   Problem: Elimination: Goal: Will not experience complications related to urinary retention Outcome: Progressing   Problem: Pain Managment: Goal: General experience of comfort will improve Outcome: Progressing   Problem: Safety: Goal: Ability to remain free from injury will improve Outcome: Progressing   Problem: Skin Integrity: Goal: Risk for impaired skin integrity will decrease Outcome: Progressing   Problem: Activity: Goal: Capacity to carry out activities will improve Outcome: Progressing

## 2018-08-25 NOTE — Progress Notes (Signed)
Progress Note  Patient Name: Colin Keller Date of Encounter: 08/25/2018  Primary Cardiologist: Olga Millers, MD   Subjective   Doing very well. Tubes out. Passing flatus. Has been ambulating with PT. Good spirits.  Inpatient Medications    Scheduled Meds: . acetaminophen  1,000 mg Oral Q6H  . aspirin EC  81 mg Oral Daily  . bisacodyl  10 mg Oral Daily   Or  . bisacodyl  10 mg Rectal Daily  . Chlorhexidine Gluconate Cloth  6 each Topical Daily  . clopidogrel  75 mg Oral Daily  . docusate sodium  200 mg Oral Daily  . enoxaparin (LOVENOX) injection  40 mg Subcutaneous QHS  . furosemide  40 mg Oral Daily  . insulin aspart  0-24 Units Subcutaneous TID AC & HS  . insulin detemir  14 Units Subcutaneous BID  . metoprolol tartrate  12.5 mg Oral BID  . moving right along book   Does not apply Once  . pantoprazole  40 mg Oral Daily  . [START ON 08/27/2018] pravastatin  20 mg Oral Once per day on Mon Wed Fri  . sodium chloride flush  10-40 mL Intracatheter Q12H  . sodium chloride flush  3 mL Intravenous Q12H  . tamsulosin  0.4 mg Oral Daily   Continuous Infusions: . sodium chloride 20 mL/hr at 08/24/18 0800  . sodium chloride Stopped (08/24/18 0546)  . sodium chloride 20 mL/hr at 08/24/18 0800  . sodium chloride     PRN Meds: sodium chloride, sodium chloride, magnesium hydroxide, metoprolol tartrate, morphine injection, ondansetron (ZOFRAN) IV, oxyCODONE, sodium chloride flush, sodium chloride flush, traMADol   Vital Signs    Vitals:   08/25/18 0600 08/25/18 0700 08/25/18 0735 08/25/18 1115  BP: 114/69 108/66    Pulse: 79 79    Resp: (!) 21 (!) 23  20  Temp:   98.8 F (37.1 C)   TempSrc:   Oral   SpO2: 98% 98%    Weight:      Height:        Intake/Output Summary (Last 24 hours) at 08/25/2018 1238 Last data filed at 08/25/2018 1100 Gross per 24 hour  Intake 119.99 ml  Output 1858 ml  Net -1738.01 ml   Filed Weights   08/23/18 0512 08/24/18 0500 08/25/18 0500    Weight: 67.9 kg 74.3 kg 73.6 kg    Telemetry    Sinus - Personally Reviewed  Physical Exam   GEN: No acute distress.   Neck: No JVD. Cardiac: RRR, no murmurs, rubs, or gallops. 2+ bilateral radial and DP pulses.  Respiratory: Clear to auscultation bilaterally GI: Soft, nontender, non-distended  MS: No edema Neuro:  Nonfocal  Psych: Normal affect   Labs    Chemistry Recent Labs  Lab 08/18/18 1707  08/22/18 0433 08/23/18 0415  08/23/18 1815  08/24/18 0411 08/24/18 1618 08/25/18 0338  NA  --    < > 137 138   < > 141  --  139  --  135  K  --    < > 4.1 4.3   < > 5.1  --  5.1  --  4.6  CL  --    < > 103 107   < > 110  --  109  --  107  CO2  --    < > 22 24  --   --   --  18*  --  19*  GLUCOSE  --    < > 265*  175*   < > 121*  --  200*  --  214*  BUN  --    < > 23 23   < > 21  --  23  --  30*  CREATININE  --    < > 1.29* 1.26*   < > 1.20   < > 1.34* 1.59* 1.55*  CALCIUM  --    < > 8.6* 9.1  --   --   --  8.0*  --  8.1*  PROT 6.5  --  5.5*  --   --   --   --   --   --   --   ALBUMIN 3.5  --  3.1*  --   --   --   --   --   --   --   AST 21  --  20  --   --   --   --   --   --   --   ALT 22  --  16  --   --   --   --   --   --   --   ALKPHOS 75  --  61  --   --   --   --   --   --   --   BILITOT 1.0  --  0.6  --   --   --   --   --   --   --   GFRNONAA  --    < > 57* 59*  --   --    < > 54* 44* 46*  GFRAA  --    < > >60 >60  --   --    < > >60 51* 53*  ANIONGAP  --    < > 12 7  --   --   --  12  --  9   < > = values in this interval not displayed.     Hematology Recent Labs  Lab 08/24/18 0411 08/24/18 1618 08/25/18 0338  WBC 10.6* 12.4* 12.5*  RBC 2.49* 2.65* 2.46*  HGB 8.0* 8.6* 7.9*  HCT 25.0* 26.8* 25.0*  MCV 100.4* 101.1* 101.6*  MCH 32.1 32.5 32.1  MCHC 32.0 32.1 31.6  RDW 13.4 13.7 13.8  PLT 149* 151 141*    Cardiac Enzymes Recent Labs  Lab 08/18/18 1401 08/18/18 1707 08/18/18 2305 08/19/18 0603  TROPONINI 0.38* 0.32* 0.44* 0.68*    Recent  Labs  Lab 08/18/18 1409  TROPIPOC 0.29*     BNP Recent Labs  Lab 08/18/18 1401  BNP 1,083.3*     Radiology    Dg Chest Port 1 View  Result Date: 08/25/2018 CLINICAL DATA:  Status post CABG EXAM: PORTABLE CHEST 1 VIEW COMPARISON:  Yesterday FINDINGS: Removal of thoracic drains and Swan-Ganz catheter. Stable low lung volumes. Curvilinear lucency at the right base which could be pneumoperitoneum or small basal pneumothorax. This is stable in retrospect. Normal postoperative heart size given low volumes. IMPRESSION: Hardware removal with stable low volume chest and atelectasis. Small crescentic lucency at the right base that could be basal pneumothorax or pneumoperitoneum. Electronically Signed   By: Marnee Spring M.D.   On: 08/25/2018 08:07   Dg Chest Port 1 View  Result Date: 08/24/2018 CLINICAL DATA:  Follow-up chest tube EXAM: PORTABLE CHEST 1 VIEW COMPARISON:  08/23/2018 FINDINGS: The endotracheal tube and nasogastric catheter have been removed in the interval. Mediastinal drain and bilateral thoracostomy catheters are  again seen and stable. Pericardial drain is stable as well. Theone MurdochSwan Ganz catheter is been removed slightly now lies in the pulmonary outflow tract. The lungs are hypoaerated compare with the prior exam with mild bibasilar atelectasis. No pneumothorax is seen. IMPRESSION: Tubes and lines as described. New bibasilar atelectatic changes without evidence of pneumothorax. Electronically Signed   By: Alcide CleverMark  Lukens M.D.   On: 08/24/2018 07:39   Dg Chest Port 1 View  Result Date: 08/23/2018 CLINICAL DATA:  Followup CABG EXAM: PORTABLE CHEST 1 VIEW COMPARISON:  08/18/2018 FINDINGS: Interval median sternotomy and CABG. Endotracheal tube tip is 5 cm above the carina. Nasogastric tube enters the abdomen. Swan-Ganz catheter tip in the region of the pulmonary valve or proximal main pulmonary artery. Bilateral chest tubes in place. No pneumothorax. Skin fold on the left simulates  pneumothorax. No pulmonary edema. Mild bibasilar atelectasis. IMPRESSION: No pneumothorax. Lines and tubes well positioned, with the exception that the Swan-Ganz catheter tip is proximal, in the region of the pulmonary valve or proximal main pulmonary artery/pulmonary outflow tract. Electronically Signed   By: Paulina FusiMark  Shogry M.D.   On: 08/23/2018 12:52   CV studies: Echo 08/19/18 Study Conclusions  - Left ventricle: The cavity size was normal. There was mild   concentric hypertrophy. Systolic function was normal. The   estimated ejection fraction was in the range of 50% to 55%.   Hypokinesis of the basal-midinferolateral and inferior   myocardium. Doppler parameters are consistent with abnormal left   ventricular relaxation (grade 1 diastolic dysfunction). Doppler   parameters are consistent with indeterminate ventricular filling   pressure. - Aortic valve: Transvalvular velocity was within the normal range.   There was no stenosis. There was no regurgitation. Valve area   (VTI): 2.32 cm^2. Valve area (Vmax): 2.66 cm^2. Valve area   (Vmean): 2.66 cm^2. - Mitral valve: Moderately calcified annulus. Transvalvular   velocity was within the normal range. There was no evidence for   stenosis. There was trivial regurgitation. - Left atrium: The atrium was severely dilated. - Right ventricle: The cavity size was normal. Wall thickness was   normal. Systolic function was normal. - Atrial septum: No defect or patent foramen ovale was identified   by color flow Doppler. - Tricuspid valve: There was no regurgitation. - Pulmonary arteries: Systolic pressure was within the normal   range. PA peak pressure: 9 mm Hg (S).  Cath 08/20/18  Mid LM to Dist LM lesion is 60% stenosed.  Ost LAD to Prox LAD lesion is 30% stenosed.  Prox LAD to Mid LAD lesion is 30% stenosed.  Ost Cx to Prox Cx lesion is 99% stenosed.  Prox RCA to Mid RCA lesion is 95% stenosed.   1.  Moderately severe distal left main  stenosis with positive FFR of 0.75 2.  Critical heavily calcified mid RCA stenosis 3.  Critical heavily calcified proximal circumflex stenosis 4.  Nonobstructive LAD stenosis 5.  Mild segmental LV dysfunction with inferolateral hypokinesis and LVEF 50 to 55% by echo assessment 6.  Normal LVEDP  Recommendations: Surgical evaluation for consideration of CABG.  The patient will be restarted on heparin after his sheath is out.  Case discussed with Dr. Tyrone SageGerhardt. Pt stable/chest pain free on heparin.   Patient Profile     [age] year old male with past medical history of diabetes mellitus, hypertension, hyperlipidemia, tobacco abuse with  non-ST elevation myocardial infarction and acute diastolic heart failure.    Assessment & Plan    NSTEMI, CAD, now s/p  CABG 9/12/19n (Dr. Cornelius Moras, LIMA-dLAD, RIMA-dRCA, SVG-OM3) -Continue aspirin, clopidogrel, and metoprolol.   -Continue low-dose pravastatin (patient did not tolerate high-dose statins in the past). -post CABG, should be on DAPT for 12 months given NSTEMI presentation -consider repatha/follow up lipid clinic -doing very well, anticipate discharge in the coming days -cardiac rehab post discharge  new onset diastolic heart failure- now euvolemic -normal LV EF on echo but focal WMA of the basal-midinferolateral and inferior walls. Grade 1 diastolic dysfunction.  chronic stage III kidney disease -holding ACE inhibitor  tobacco abuse -plan to quit now  hyperlipidemia -continue pravastatin; he did not tolerate high-dose statins in the past.  Follow-up in lipid clinic for consideration of Repatha.  hypertension -holding ACE inhibitor -continue metoprolol  For questions or updates, please contact [facility] Please consult [email or web address] for contact info under     Signed, Jodelle Red, MD  08/25/2018, 12:38 PM

## 2018-08-25 NOTE — Progress Notes (Signed)
2 Days Post-Op Procedure(s) (LRB): CORONARY ARTERY BYPASS GRAFTING (CABG) x 3; -Left Internal Mammary Artery to Left Anterior Descending Artery, -Right Internal Mammary Artery to Right Coronary Artery, -Saphenous Vein Graft to Obtuse Marginal;  ENDOSCOPIC HARVEST GREATER SAPHENOUS VEIN  -Right Thigh (N/A) TRANSESOPHAGEAL ECHOCARDIOGRAM (TEE) (N/A) Subjective: Progressing well after CABG Transition off insulin drip and transfer to 4 E  Objective: Vital signs in last 24 hours: Temp:  [98.5 F (36.9 C)-99.8 F (37.7 C)] 98.8 F (37.1 C) (09/14 0735) Pulse Rate:  [79-96] 79 (09/14 0700) Cardiac Rhythm: Normal sinus rhythm (09/14 0800) Resp:  [16-30] 23 (09/14 0700) BP: (88-133)/(59-94) 108/66 (09/14 0700) SpO2:  [84 %-98 %] 98 % (09/14 0700) Arterial Line BP: (145-148)/(50-55) 148/55 (09/13 1200) Weight:  [73.6 kg] 73.6 kg (09/14 0500)  Hemodynamic parameters for last 24 hours: PAP: (32)/(18) 32/18  Intake/Output from previous day: 09/13 0701 - 09/14 0700 In: 683.9 [P.O.:360; I.V.:72; IV Piggyback:251.8] Out: 1888 [Urine:1620; Chest Tube:268] Intake/Output this shift: Total I/O In: 50 [IV Piggyback:50] Out: 100 [Urine:100]       Exam    General- alert and comfortable    Neck- no JVD, no cervical adenopathy palpable, no carotid bruit   Lungs- clear without rales, wheezes   Cor- regular rate and rhythm, no murmur , gallop   Abdomen- soft, non-tender   Extremities - warm, non-tender, minimal edema   Neuro- oriented, appropriate, no focal weakness   Lab Results: Recent Labs    08/24/18 1618 08/25/18 0338  WBC 12.4* 12.5*  HGB 8.6* 7.9*  HCT 26.8* 25.0*  PLT 151 141*   BMET:  Recent Labs    08/24/18 0411 08/24/18 1618 08/25/18 0338  NA 139  --  135  K 5.1  --  4.6  CL 109  --  107  CO2 18*  --  19*  GLUCOSE 200*  --  214*  BUN 23  --  30*  CREATININE 1.34* 1.59* 1.55*  CALCIUM 8.0*  --  8.1*    PT/INR:  Recent Labs    08/23/18 1222  LABPROT 15.4*  INR  1.23   ABG    Component Value Date/Time   PHART 7.307 (L) 08/23/2018 1726   HCO3 19.0 (L) 08/23/2018 1726   TCO2 23 08/23/2018 1815   ACIDBASEDEF 7.0 (H) 08/23/2018 1726   O2SAT 96.0 08/23/2018 1726   CBG (last 3)  Recent Labs    08/25/18 0637 08/25/18 0750 08/25/18 0912  GLUCAP 153* 104* 151*    Assessment/Plan: S/P Procedure(s) (LRB): CORONARY ARTERY BYPASS GRAFTING (CABG) x 3; -Left Internal Mammary Artery to Left Anterior Descending Artery, -Right Internal Mammary Artery to Right Coronary Artery, -Saphenous Vein Graft to Obtuse Marginal;  ENDOSCOPIC HARVEST GREATER SAPHENOUS VEIN  -Right Thigh (N/A) TRANSESOPHAGEAL ECHOCARDIOGRAM (TEE) (N/A) Mobilize Diuresis Diabetes control d/c tubes/lines   LOS: 7 days    Kathlee Nationseter Van Trigt III 08/25/2018

## 2018-08-25 NOTE — Progress Notes (Signed)
CT surgery p.m. Rounds  Patient examined and record reviewed.Hemodynamics stable,labs satisfactory.Patient had stable day.Continue current care. Colin Keller 08/25/2018

## 2018-08-25 NOTE — Plan of Care (Signed)
?  Problem: Clinical Measurements: ?Goal: Respiratory complications will improve ?Outcome: Progressing ?Goal: Cardiovascular complication will be avoided ?Outcome: Progressing ?  ?Problem: Activity: ?Goal: Risk for activity intolerance will decrease ?Outcome: Progressing ?  ?Problem: Coping: ?Goal: Level of anxiety will decrease ?Outcome: Progressing ?  ?Problem: Elimination: ?Goal: Will not experience complications related to urinary retention ?Outcome: Progressing ?  ?Problem: Pain Managment: ?Goal: General experience of comfort will improve ?Outcome: Progressing ?  ?Problem: Safety: ?Goal: Ability to remain free from injury will improve ?Outcome: Progressing ?  ?Problem: Skin Integrity: ?Goal: Risk for impaired skin integrity will decrease ?Outcome: Progressing ?  ?

## 2018-08-26 ENCOUNTER — Inpatient Hospital Stay (HOSPITAL_COMMUNITY): Payer: Self-pay

## 2018-08-26 DIAGNOSIS — I2510 Atherosclerotic heart disease of native coronary artery without angina pectoris: Secondary | ICD-10-CM

## 2018-08-26 LAB — TYPE AND SCREEN
ABO/RH(D): A POS
ANTIBODY SCREEN: NEGATIVE
UNIT DIVISION: 0
UNIT DIVISION: 0
UNIT DIVISION: 0
UNIT DIVISION: 0
Unit division: 0
Unit division: 0

## 2018-08-26 LAB — CBC
HCT: 24.3 % — ABNORMAL LOW (ref 39.0–52.0)
Hemoglobin: 7.9 g/dL — ABNORMAL LOW (ref 13.0–17.0)
MCH: 32 pg (ref 26.0–34.0)
MCHC: 32.5 g/dL (ref 30.0–36.0)
MCV: 98.4 fL (ref 78.0–100.0)
Platelets: 160 10*3/uL (ref 150–400)
RBC: 2.47 MIL/uL — ABNORMAL LOW (ref 4.22–5.81)
RDW: 13.4 % (ref 11.5–15.5)
WBC: 10 10*3/uL (ref 4.0–10.5)

## 2018-08-26 LAB — BASIC METABOLIC PANEL
Anion gap: 7 (ref 5–15)
BUN: 30 mg/dL — ABNORMAL HIGH (ref 8–23)
CO2: 21 mmol/L — ABNORMAL LOW (ref 22–32)
Calcium: 8.4 mg/dL — ABNORMAL LOW (ref 8.9–10.3)
Chloride: 109 mmol/L (ref 98–111)
Creatinine, Ser: 1.38 mg/dL — ABNORMAL HIGH (ref 0.61–1.24)
GFR calc Af Amer: >60 mL/min (ref >60)
GFR calc non Af Amer: 53 mL/min — ABNORMAL LOW (ref >60)
Glucose, Bld: 82 mg/dL (ref 70–99)
Potassium: 4 mmol/L (ref 3.5–5.1)
Sodium: 137 mmol/L (ref 135–145)

## 2018-08-26 LAB — BPAM RBC
BLOOD PRODUCT EXPIRATION DATE: 201910062359
BLOOD PRODUCT EXPIRATION DATE: 201910072359
BLOOD PRODUCT EXPIRATION DATE: 201910072359
BLOOD PRODUCT EXPIRATION DATE: 201910072359
Blood Product Expiration Date: 201910072359
Blood Product Expiration Date: 201910072359
ISSUE DATE / TIME: 201909120829
ISSUE DATE / TIME: 201909120829
ISSUE DATE / TIME: 201909120918
ISSUE DATE / TIME: 201909132341
UNIT TYPE AND RH: 6200
UNIT TYPE AND RH: 6200
UNIT TYPE AND RH: 6200
UNIT TYPE AND RH: 6200
Unit Type and Rh: 6200
Unit Type and Rh: 6200

## 2018-08-26 LAB — GLUCOSE, CAPILLARY
GLUCOSE-CAPILLARY: 104 mg/dL — AB (ref 70–99)
GLUCOSE-CAPILLARY: 425 mg/dL — AB (ref 70–99)
GLUCOSE-CAPILLARY: 280 mg/dL — AB (ref 70–99)
Glucose-Capillary: 68 mg/dL — ABNORMAL LOW (ref 70–99)
Glucose-Capillary: 231 mg/dL — ABNORMAL HIGH (ref 70–99)
Glucose-Capillary: 308 mg/dL — ABNORMAL HIGH (ref 70–99)

## 2018-08-26 MED ORDER — SODIUM CHLORIDE 0.9% FLUSH: 3.0000 mL | INTRAVENOUS | Status: DC | PRN

## 2018-08-26 MED ORDER — LISINOPRIL 5 MG PO TABS
5.0000 mg | ORAL_TABLET | Freq: Every day | ORAL | Status: DC
  Administered 2018-08-26 – 2018-08-28 (×3): 5 mg via ORAL
  Filled 2018-08-26 (×3): qty 1

## 2018-08-26 MED ORDER — SODIUM CHLORIDE 0.9% FLUSH
3.0000 mL | Freq: Two times a day (BID) | INTRAVENOUS | Status: DC
  Administered 2018-08-26 – 2018-08-27 (×4): 3 mL via INTRAVENOUS

## 2018-08-26 MED ORDER — INSULIN DETEMIR 100 UNIT/ML ~~LOC~~ SOLN
20.0000 [IU] | Freq: Two times a day (BID) | SUBCUTANEOUS | Status: DC
  Administered 2018-08-26: 20 [IU] via SUBCUTANEOUS
  Filled 2018-08-26 (×2): qty 0.2

## 2018-08-26 MED ORDER — SODIUM CHLORIDE 0.9 % IV SOLN: 250.0000 mL | INTRAVENOUS | Status: DC | PRN

## 2018-08-26 MED ORDER — FE FUMARATE-B12-VIT C-FA-IFC PO CAPS
1.0000 | ORAL_CAPSULE | Freq: Two times a day (BID) | ORAL | Status: DC
  Administered 2018-08-26 – 2018-08-28 (×5): 1 via ORAL
  Filled 2018-08-26 (×5): qty 1

## 2018-08-26 MED ORDER — MOVING RIGHT ALONG BOOK
Freq: Once | Status: AC
  Filled 2018-08-26: qty 1

## 2018-08-26 MED ORDER — MAGNESIUM HYDROXIDE 400 MG/5ML PO SUSP: 30.0000 mL | Freq: Every day | ORAL | Status: DC | PRN

## 2018-08-26 NOTE — Progress Notes (Signed)
3 Days Post-Op Procedure(s) (LRB): CORONARY ARTERY BYPASS GRAFTING (CABG) x 3; -Left Internal Mammary Artery to Left Anterior Descending Artery, -Right Internal Mammary Artery to Right Coronary Artery, -Saphenous Vein Graft to Obtuse Marginal;  ENDOSCOPIC HARVEST GREATER SAPHENOUS VEIN  -Right Thigh (N/A) TRANSESOPHAGEAL ECHOCARDIOGRAM (TEE) (N/A) Subjective: Doing well , progressing after CABG Postop anemia,mild creat elevation; hx diabetes  Objective: Vital signs in last 24 hours: Temp:  [98.6 F (37 C)-99.3 F (37.4 C)] 98.6 F (37 C) (09/15 0400) Pulse Rate:  [84-101] 88 (09/15 0700) Cardiac Rhythm: Normal sinus rhythm (09/15 0000) Resp:  [13-22] 18 (09/15 0700) BP: (107-142)/(55-68) 142/68 (09/15 0700) SpO2:  [88 %-98 %] 98 % (09/15 0700) Weight:  [73.6 kg] 73.6 kg (09/15 0700)  Hemodynamic parameters for last 24 hours:    Intake/Output from previous day: 09/14 0701 - 09/15 0700 In: 170 [P.O.:120; IV Piggyback:50] Out: 1550 [Urine:1550] Intake/Output this shift: No intake/output data recorded.       Exam    General- alert and comfortable    Neck- no JVD, no cervical adenopathy palpable, no carotid bruit   Lungs- clear without rales, wheezes   Cor- regular rate and rhythm, no murmur , gallop   Abdomen- soft, non-tender   Extremities - warm, non-tender, minimal edema   Neuro- oriented, appropriate, no focal weakness   Lab Results: Recent Labs    08/25/18 0338 08/26/18 0321  WBC 12.5* 10.0  HGB 7.9* 7.9*  HCT 25.0* 24.3*  PLT 141* 160   BMET:  Recent Labs    08/25/18 0338 08/26/18 0321  NA 135 137  K 4.6 4.0  CL 107 109  CO2 19* 21*  GLUCOSE 214* 82  BUN 30* 30*  CREATININE 1.55* 1.38*  CALCIUM 8.1* 8.4*    PT/INR:  Recent Labs    08/23/18 1222  LABPROT 15.4*  INR 1.23   ABG    Component Value Date/Time   PHART 7.307 (L) 08/23/2018 1726   HCO3 19.0 (L) 08/23/2018 1726   TCO2 23 08/23/2018 1815   ACIDBASEDEF 7.0 (H) 08/23/2018 1726   O2SAT 96.0 08/23/2018 1726   CBG (last 3)  Recent Labs    08/25/18 2056 08/26/18 0657 08/26/18 0728  GLUCAP 210* 68* 104*    Assessment/Plan: S/P Procedure(s) (LRB): CORONARY ARTERY BYPASS GRAFTING (CABG) x 3; -Left Internal Mammary Artery to Left Anterior Descending Artery, -Right Internal Mammary Artery to Right Coronary Artery, -Saphenous Vein Graft to Obtuse Marginal;  ENDOSCOPIC HARVEST GREATER SAPHENOUS VEIN  -Right Thigh (N/A) TRANSESOPHAGEAL ECHOCARDIOGRAM (TEE) (N/A) Mobilize Diuresis Diabetes control transfer to 4E   LOS: 8 days    Kathlee Nationseter Van Trigt III 08/26/2018

## 2018-08-26 NOTE — Progress Notes (Signed)
Progress Note  Patient Name: Colin Keller Date of Encounter: 08/26/2018  Primary Cardiologist: Olga Millers, MD   Subjective   Doing well overall. Had 12 beats of afib last evening, asymptomatic. Feeling well and looking forward to going home in the next day or two.  Inpatient Medications    Scheduled Meds: . acetaminophen  1,000 mg Oral Q6H  . aspirin EC  81 mg Oral Daily  . bisacodyl  10 mg Oral Daily   Or  . bisacodyl  10 mg Rectal Daily  . Chlorhexidine Gluconate Cloth  6 each Topical Daily  . clopidogrel  75 mg Oral Daily  . docusate sodium  200 mg Oral Daily  . enoxaparin (LOVENOX) injection  40 mg Subcutaneous QHS  . ferrous fumarate-b12-vitamic C-folic acid  1 capsule Oral BID BM  . furosemide  40 mg Oral Daily  . Influenza vac split quadrivalent PF  0.5 mL Intramuscular Tomorrow-1000  . insulin aspart  0-24 Units Subcutaneous TID AC & HS  . insulin detemir  14 Units Subcutaneous BID  . metoprolol tartrate  12.5 mg Oral BID  . pantoprazole  40 mg Oral Daily  . [START ON 08/27/2018] pravastatin  20 mg Oral Once per day on Mon Wed Fri  . sodium chloride flush  10-40 mL Intracatheter Q12H  . sodium chloride flush  3 mL Intravenous Q12H  . sodium chloride flush  3 mL Intravenous Q12H  . tamsulosin  0.4 mg Oral Daily   Continuous Infusions: . sodium chloride 20 mL/hr at 08/24/18 0800  . sodium chloride Stopped (08/24/18 0546)  . sodium chloride 20 mL/hr at 08/24/18 0800  . sodium chloride    . sodium chloride     PRN Meds: sodium chloride, sodium chloride, sodium chloride, magnesium hydroxide, metoprolol tartrate, morphine injection, ondansetron (ZOFRAN) IV, oxyCODONE, sodium chloride flush, sodium chloride flush, sodium chloride flush, traMADol   Vital Signs    Vitals:   08/26/18 0500 08/26/18 0600 08/26/18 0700 08/26/18 0825  BP: 131/61 126/65 (!) 142/68   Pulse: 88 90 88   Resp: 14 15 18    Temp:    98.2 F (36.8 C)  TempSrc:    Oral  SpO2: 98% 96%  98%   Weight:   73.6 kg   Height:        Intake/Output Summary (Last 24 hours) at 08/26/2018 1054 Last data filed at 08/26/2018 0400 Gross per 24 hour  Intake 120 ml  Output 1450 ml  Net -1330 ml   Filed Weights   08/24/18 0500 08/25/18 0500 08/26/18 0700  Weight: 74.3 kg 73.6 kg 73.6 kg    Telemetry    Sinus with 12 beats of afib on 9/14 at 17:18- Personally Reviewed  Physical Exam   GEN: No acute distress.   Neck: No JVD. Cardiac: RRR, no murmurs, rubs, or gallops. 2+ bilateral radial and DP pulses.  Respiratory: Clear to auscultation bilaterally GI: Soft, nontender, non-distended  MS: No edema Neuro:  Nonfocal  Psych: Normal affect   Labs    Chemistry Recent Labs  Lab 08/22/18 0433  08/24/18 0411 08/24/18 1618 08/25/18 0338 08/26/18 0321  NA 137   < > 139  --  135 137  K 4.1   < > 5.1  --  4.6 4.0  CL 103   < > 109  --  107 109  CO2 22   < > 18*  --  19* 21*  GLUCOSE 265*   < > 200*  --  214* 82  BUN 23   < > 23  --  30* 30*  CREATININE 1.29*   < > 1.34* 1.59* 1.55* 1.38*  CALCIUM 8.6*   < > 8.0*  --  8.1* 8.4*  PROT 5.5*  --   --   --   --   --   ALBUMIN 3.1*  --   --   --   --   --   AST 20  --   --   --   --   --   ALT 16  --   --   --   --   --   ALKPHOS 61  --   --   --   --   --   BILITOT 0.6  --   --   --   --   --   GFRNONAA 57*   < > 54* 44* 46* 53*  GFRAA >60   < > >60 51* 53* >60  ANIONGAP 12   < > 12  --  9 7   < > = values in this interval not displayed.     Hematology Recent Labs  Lab 08/24/18 1618 08/25/18 0338 08/26/18 0321  WBC 12.4* 12.5* 10.0  RBC 2.65* 2.46* 2.47*  HGB 8.6* 7.9* 7.9*  HCT 26.8* 25.0* 24.3*  MCV 101.1* 101.6* 98.4  MCH 32.5 32.1 32.0  MCHC 32.1 31.6 32.5  RDW 13.7 13.8 13.4  PLT 151 141* 160    Cardiac Enzymes No results for input(s): TROPONINI in the last 168 hours.  No results for input(s): TROPIPOC in the last 168 hours.   BNP No results for input(s): BNP, PROBNP in the last 168 hours.    Radiology    Dg Chest 2 View  Result Date: 08/26/2018 CLINICAL DATA:  History of CABG EXAM: CHEST - 2 VIEW COMPARISON:  Yesterday FINDINGS: Interval removal of IJ sheath. Mild atelectasis at the bases that is stable. Trace effusions seen on the lateral view. Stable postoperative cardiopericardial size. Gas collection at the right base that could be pneumoperitoneum or basal pneumothorax. There is anterior pneumomediastinum. IMPRESSION: 1. Atelectasis with small effusions that is stable from yesterday. 2. Stable basal small pneumothorax or pneumoperitoneum on the right. Minimal pneumomediastinum. Electronically Signed   By: Marnee Spring M.D.   On: 08/26/2018 07:35   Dg Chest Port 1 View  Result Date: 08/25/2018 CLINICAL DATA:  Status post CABG EXAM: PORTABLE CHEST 1 VIEW COMPARISON:  Yesterday FINDINGS: Removal of thoracic drains and Swan-Ganz catheter. Stable low lung volumes. Curvilinear lucency at the right base which could be pneumoperitoneum or small basal pneumothorax. This is stable in retrospect. Normal postoperative heart size given low volumes. IMPRESSION: Hardware removal with stable low volume chest and atelectasis. Small crescentic lucency at the right base that could be basal pneumothorax or pneumoperitoneum. Electronically Signed   By: Marnee Spring M.D.   On: 08/25/2018 08:07   CV studies: Echo 08/19/18 Study Conclusions  - Left ventricle: The cavity size was normal. There was mild   concentric hypertrophy. Systolic function was normal. The   estimated ejection fraction was in the range of 50% to 55%.   Hypokinesis of the basal-midinferolateral and inferior   myocardium. Doppler parameters are consistent with abnormal left   ventricular relaxation (grade 1 diastolic dysfunction). Doppler   parameters are consistent with indeterminate ventricular filling   pressure. - Aortic valve: Transvalvular velocity was within the normal range.   There was no stenosis. There  was no  regurgitation. Valve area   (VTI): 2.32 cm^2. Valve area (Vmax): 2.66 cm^2. Valve area   (Vmean): 2.66 cm^2. - Mitral valve: Moderately calcified annulus. Transvalvular   velocity was within the normal range. There was no evidence for   stenosis. There was trivial regurgitation. - Left atrium: The atrium was severely dilated. - Right ventricle: The cavity size was normal. Wall thickness was   normal. Systolic function was normal. - Atrial septum: No defect or patent foramen ovale was identified   by color flow Doppler. - Tricuspid valve: There was no regurgitation. - Pulmonary arteries: Systolic pressure was within the normal   range. PA peak pressure: 9 mm Hg (S).  Cath 08/20/18  Mid LM to Dist LM lesion is 60% stenosed.  Ost LAD to Prox LAD lesion is 30% stenosed.  Prox LAD to Mid LAD lesion is 30% stenosed.  Ost Cx to Prox Cx lesion is 99% stenosed.  Prox RCA to Mid RCA lesion is 95% stenosed.   1.  Moderately severe distal left main stenosis with positive FFR of 0.75 2.  Critical heavily calcified mid RCA stenosis 3.  Critical heavily calcified proximal circumflex stenosis 4.  Nonobstructive LAD stenosis 5.  Mild segmental LV dysfunction with inferolateral hypokinesis and LVEF 50 to 55% by echo assessment 6.  Normal LVEDP  Recommendations: Surgical evaluation for consideration of CABG.  The patient will be restarted on heparin after his sheath is out.  Case discussed with Dr. Tyrone Sage. Pt stable/chest pain free on heparin.   Patient Profile     62 year old male with past medical history of diabetes mellitus, hypertension, hyperlipidemia, tobacco abuse with  non-ST elevation myocardial infarction and acute diastolic heart failure.    Assessment & Plan    NSTEMI, CAD, now s/p CABG 9/12/19n (Dr. Cornelius Moras, LIMA-dLAD, RIMA-dRCA, SVG-OM3) -Continue aspirin, clopidogrel, and metoprolol.   -Continue low-dose pravastatin (patient did not tolerate high-dose statins in the  past). -post CABG, should be on DAPT for 12 months given NSTEMI presentation -consider repatha/follow up lipid clinic -doing very well, anticipate discharge in next 1-2 days -cardiac rehab post discharge  new onset diastolic heart failure- now euvolemic -normal LV EF on echo but focal WMA of the basal-midinferolateral and inferior walls. Grade 1 diastolic dysfunction.  chronic stage III kidney disease -holding ACE inhibitor perioperatively, but BP gradually rising. Will start half of his home lisinopril as his kidney function is now at his baseline.  tobacco abuse -plan to quit now  hyperlipidemia -continue pravastatin; he did not tolerate high-dose statins in the past.  Follow-up in lipid clinic for consideration of Repatha.  hypertension -restarting low dose lisinopril today as above -continue metoprolol  Type II diabetes: blood sugars have been decreasing post-op, was 68 this AM. A1c was 9.5 on admission (10.7 prior). Recommend diabetes management education continuing as an outpatient.  CARDIOLOGY RECOMMENDATIONS:  Discharge is anticipated in the next 48 hours. Recommendations for medications and follow up:  Discharge Medications:  Aspirin 81 mg  Clopidogrel 75 mg  Lisinopril 5 mg (half of prior home dose)  Metoprolol tartrate 12.5 mg BID or metoprolol succinate 25 mg daily  Pravastatin 20 mg three times weekly  Do not anticipate need for furosemide at discharge.  Follow Up: The patient's Primary Cardiologist is Olga Millers, MD  Follow up in the office in 2 week(s).  Signed,  Jodelle Red, MD  11:03 AM 08/26/2018  [facility] For questions or updates, please contact [facility] Please consult [email or web address] for  contact info under     Signed, Jodelle RedBridgette Christy Ehrsam, MD  08/26/2018, 10:54 AM

## 2018-08-26 NOTE — Progress Notes (Signed)
CT surgery p.m. Rounds  Patient examined and record reviewed.Hemodynamics stable,labs satisfactory except for increased p.m. blood glucose.  Lantus dose adjusted.  Waiting on bed for transfer to  telemetry.  Patient had stable day.Continue current care. Kathlee Nationseter Van Trigt III 08/26/2018

## 2018-08-27 ENCOUNTER — Inpatient Hospital Stay (HOSPITAL_COMMUNITY): Payer: Self-pay

## 2018-08-27 LAB — GLUCOSE, CAPILLARY
Glucose-Capillary: 53 mg/dL — ABNORMAL LOW (ref 70–99)
Glucose-Capillary: 97 mg/dL (ref 70–99)
Glucose-Capillary: 254 mg/dL — ABNORMAL HIGH (ref 70–99)
Glucose-Capillary: 103 mg/dL — ABNORMAL HIGH (ref 70–99)
Glucose-Capillary: 261 mg/dL — ABNORMAL HIGH (ref 70–99)

## 2018-08-27 LAB — BASIC METABOLIC PANEL
Anion gap: 8 (ref 5–15)
BUN: 27 mg/dL — ABNORMAL HIGH (ref 8–23)
CO2: 26 mmol/L (ref 22–32)
Calcium: 8.9 mg/dL (ref 8.9–10.3)
Chloride: 107 mmol/L (ref 98–111)
Creatinine, Ser: 1.21 mg/dL (ref 0.61–1.24)
GFR calc Af Amer: >60 mL/min (ref >60)
GFR calc non Af Amer: >60 mL/min (ref >60)
Glucose, Bld: 52 mg/dL — ABNORMAL LOW (ref 70–99)
Potassium: 4 mmol/L (ref 3.5–5.1)
Sodium: 141 mmol/L (ref 135–145)

## 2018-08-27 LAB — CBC
HCT: 27.8 % — ABNORMAL LOW (ref 39.0–52.0)
Hemoglobin: 9 g/dL — ABNORMAL LOW (ref 13.0–17.0)
MCH: 31.9 pg (ref 26.0–34.0)
MCHC: 32.4 g/dL (ref 30.0–36.0)
MCV: 98.6 fL (ref 78.0–100.0)
Platelets: 240 10*3/uL (ref 150–400)
RBC: 2.82 MIL/uL — ABNORMAL LOW (ref 4.22–5.81)
RDW: 13.1 % (ref 11.5–15.5)
WBC: 11.1 10*3/uL — ABNORMAL HIGH (ref 4.0–10.5)

## 2018-08-27 MED ORDER — INSULIN ASPART 100 UNIT/ML ~~LOC~~ SOLN
0.0000 [IU] | Freq: Every day | SUBCUTANEOUS | Status: DC
  Administered 2018-08-27: 3 [IU] via SUBCUTANEOUS

## 2018-08-27 MED ORDER — INSULIN ASPART 100 UNIT/ML ~~LOC~~ SOLN
3.0000 [IU] | Freq: Three times a day (TID) | SUBCUTANEOUS | Status: DC
  Administered 2018-08-27 – 2018-08-28 (×4): 3 [IU] via SUBCUTANEOUS

## 2018-08-27 MED ORDER — INSULIN DETEMIR 100 UNIT/ML ~~LOC~~ SOLN
20.0000 [IU] | Freq: Every day | SUBCUTANEOUS | Status: DC
  Administered 2018-08-27 – 2018-08-28 (×2): 20 [IU] via SUBCUTANEOUS
  Filled 2018-08-27 (×2): qty 0.2

## 2018-08-27 MED ORDER — METOPROLOL TARTRATE 25 MG PO TABS
25.0000 mg | ORAL_TABLET | Freq: Two times a day (BID) | ORAL | Status: DC
  Administered 2018-08-27 – 2018-08-28 (×3): 25 mg via ORAL
  Filled 2018-08-27 (×3): qty 1

## 2018-08-27 MED ORDER — INSULIN ASPART 100 UNIT/ML ~~LOC~~ SOLN
0.0000 [IU] | Freq: Three times a day (TID) | SUBCUTANEOUS | Status: DC
  Administered 2018-08-28: 2 [IU] via SUBCUTANEOUS
  Administered 2018-08-28: 5 [IU] via SUBCUTANEOUS

## 2018-08-27 MED ORDER — INSULIN DETEMIR 100 UNIT/ML ~~LOC~~ SOLN
10.0000 [IU] | Freq: Two times a day (BID) | SUBCUTANEOUS | Status: DC
  Filled 2018-08-27: qty 0.1

## 2018-08-27 NOTE — Progress Notes (Signed)
CARDIAC REHAB PHASE I   PRE:  Rate/Rhythm: 93 SR  BP:  Supine: 132/56  Sitting:   Standing:    SaO2: 96%RA  MODE:  Ambulation: 940 ft   POST:  Rate/Rhythm: 127 ST   101 at rest  BP:  Supine:   Sitting: 136/76  Standing:    SaO2: 99%RA [id number] Pt walked 940 ft on RA with hand held asst with steady gait and tolerated well. Heart rate elevated. To recliner with call bell. Pt motivated to walk.   Luetta Nuttingharlene Laythan Hayter, RN BSN  08/27/2018 8:40 AM

## 2018-08-27 NOTE — Progress Notes (Signed)
Progress Note  Patient Name: Colin Keller Date of Encounter: 08/27/2018  Primary Cardiologist: Olga Millers, MD   Subjective   No chest pain or dyspnea  Inpatient Medications    Scheduled Meds: . acetaminophen  1,000 mg Oral Q6H  . aspirin EC  81 mg Oral Daily  . bisacodyl  10 mg Oral Daily   Or  . bisacodyl  10 mg Rectal Daily  . Chlorhexidine Gluconate Cloth  6 each Topical Daily  . clopidogrel  75 mg Oral Daily  . docusate sodium  200 mg Oral Daily  . enoxaparin (LOVENOX) injection  40 mg Subcutaneous QHS  . ferrous fumarate-b12-vitamic C-folic acid  1 capsule Oral BID BM  . furosemide  40 mg Oral Daily  . Influenza vac split quadrivalent PF  0.5 mL Intramuscular Tomorrow-1000  . insulin aspart  0-24 Units Subcutaneous TID AC & HS  . insulin aspart  3 Units Subcutaneous TID WC  . insulin detemir  20 Units Subcutaneous Daily  . lisinopril  5 mg Oral Daily  . metoprolol tartrate  25 mg Oral BID  . pantoprazole  40 mg Oral Daily  . pravastatin  20 mg Oral Once per day on Mon Wed Fri  . sodium chloride flush  10-40 mL Intracatheter Q12H  . sodium chloride flush  3 mL Intravenous Q12H  . sodium chloride flush  3 mL Intravenous Q12H  . tamsulosin  0.4 mg Oral Daily   Continuous Infusions: . sodium chloride Stopped (08/24/18 0546)  . sodium chloride    . sodium chloride     PRN Meds: sodium chloride, sodium chloride, magnesium hydroxide, metoprolol tartrate, morphine injection, ondansetron (ZOFRAN) IV, oxyCODONE, sodium chloride flush, sodium chloride flush, sodium chloride flush, traMADol   Vital Signs    Vitals:   08/26/18 2038 08/27/18 0102 08/27/18 0509 08/27/18 1015  BP: 123/71 127/62 134/67 (!) 148/74  Pulse: 98 90 93 98  Resp: (!) 27 (!) 21 14   Temp: 98.4 F (36.9 C) 97.8 F (36.6 C) 98.7 F (37.1 C)   TempSrc: Oral Oral Oral   SpO2: 100% 100% 98%   Weight:   71.6 kg   Height:        Intake/Output Summary (Last 24 hours) at 08/27/2018 1017 Last  data filed at 08/26/2018 1815 Gross per 24 hour  Intake 740 ml  Output 1650 ml  Net -910 ml   Filed Weights   08/25/18 0500 08/26/18 0700 08/27/18 0509  Weight: 73.6 kg 73.6 kg 71.6 kg    Telemetry    Sinus tachycardia- Personally Reviewed  Physical Exam   GEN: No acute distress.   Neck: No JVD Cardiac: RRR, no murmurs, rubs, or gallops.  Respiratory: Clear to auscultation bilaterally. S/P sternotomy GI: Soft, nontender, non-distended  MS: No edema Neuro:  Nonfocal  Psych: Normal affect   Labs    Chemistry Recent Labs  Lab 08/22/18 0433  08/25/18 0338 08/26/18 0321 08/27/18 0509  NA 137   < > 135 137 141  K 4.1   < > 4.6 4.0 4.0  CL 103   < > 107 109 107  CO2 22   < > 19* 21* 26  GLUCOSE 265*   < > 214* 82 52*  BUN 23   < > 30* 30* 27*  CREATININE 1.29*   < > 1.55* 1.38* 1.21  CALCIUM 8.6*   < > 8.1* 8.4* 8.9  PROT 5.5*  --   --   --   --  ALBUMIN 3.1*  --   --   --   --   AST 20  --   --   --   --   ALT 16  --   --   --   --   ALKPHOS 61  --   --   --   --   BILITOT 0.6  --   --   --   --   GFRNONAA 57*   < > 46* 53* >60  GFRAA >60   < > 53* >60 >60  ANIONGAP 12   < > 9 7 8    < > = values in this interval not displayed.     Hematology Recent Labs  Lab 08/25/18 0338 08/26/18 0321 08/27/18 0509  WBC 12.5* 10.0 11.1*  RBC 2.46* 2.47* 2.82*  HGB 7.9* 7.9* 9.0*  HCT 25.0* 24.3* 27.8*  MCV 101.6* 98.4 98.6  MCH 32.1 32.0 31.9  MCHC 31.6 32.5 32.4  RDW 13.8 13.4 13.1  PLT 141* 160 240    Radiology    Dg Chest 2 View  Result Date: 08/27/2018 CLINICAL DATA:  Hypertension. Recent coronary artery bypass grafting EXAM: CHEST - 2 VIEW COMPARISON:  August 26, 2018 FINDINGS: Temporary pacemaker wires remain attached to the right heart. There is a stable minimal pneumothorax on the right. There are small pleural effusions bilaterally. There is no edema or consolidation. Heart is upper normal in size with pulmonary vascularity normal. Patient is status  post coronary artery bypass grafting. There is aortic atherosclerosis. No evident bone lesions. IMPRESSION: Stable minimal pneumothorax on the right. Small pleural effusions bilaterally. No edema or consolidation. Stable cardiac silhouette. There is aortic atherosclerosis. Temporary pacemaker wires remain attached to the right heart. Aortic Atherosclerosis (ICD10-I70.0). Electronically Signed   By: Bretta Bang III M.D.   On: 08/27/2018 07:14   Dg Chest 2 View  Result Date: 08/26/2018 CLINICAL DATA:  History of CABG EXAM: CHEST - 2 VIEW COMPARISON:  Yesterday FINDINGS: Interval removal of IJ sheath. Mild atelectasis at the bases that is stable. Trace effusions seen on the lateral view. Stable postoperative cardiopericardial size. Gas collection at the right base that could be pneumoperitoneum or basal pneumothorax. There is anterior pneumomediastinum. IMPRESSION: 1. Atelectasis with small effusions that is stable from yesterday. 2. Stable basal small pneumothorax or pneumoperitoneum on the right. Minimal pneumomediastinum. Electronically Signed   By: Marnee Spring M.D.   On: 08/26/2018 07:35    Patient Profile     62 y.o. male admitted with non-ST elevation myocardial infarction and acute diastolic congestive heart failure.  Now status post coronary artery bypass graft.  Preoperative echocardiogram showed ejection fraction 50 to 55%.  Assessment & Plan    1 coronary artery disease-patient doing well status post coronary artery bypass graft.  Continue aspirin, Plavix and low-dose statin.  2 carotid artery disease-he will need follow-up carotid Doppler September 2020.  3 hyperlipidemia-continue low-dose statin.  LDL was 117 at admission.  Will refer to lipid clinic following discharge for consideration of Repatha.  4 hypertension-blood pressure is controlled.  Continue present medications and follow.  5 diabetes mellitus-agree with diabetes management coordinator consult.  Follow-up primary  care following discharge.  May also need endocrinology input.  CARDIOLOGY RECOMMENDATIONS:  Discharge is anticipated in the next 48 hours. Recommendations for medications and follow up:  Discharge Medications: Continue medications as they are currently listed in the [facility].  Follow Up: The patient's Primary Cardiologist is Olga Millers, MD  Follow up  in the office in 2-4 weeks with APP; FU me 3 months.  Signed,  Olga MillersBrian Caleyah Jr, MD  10:28 AM 08/27/2018  [facility]  For questions or updates, please contact [facility] Please consult [email or web address] for contact info under        Signed, Olga MillersBrian Abisai Deer, MD  08/27/2018, 10:17 AM

## 2018-08-27 NOTE — Plan of Care (Signed)
  Problem: Clinical Measurements: Goal: Ability to maintain clinical measurements within normal limits will improve Outcome: Progressing Goal: Will remain free from infection Outcome: Progressing Goal: Diagnostic test results will improve Outcome: Progressing Goal: Respiratory complications will improve Outcome: Progressing Goal: Cardiovascular complication will be avoided Outcome: Progressing   Problem: Activity: Goal: Risk for activity intolerance will decrease Outcome: Progressing   Problem: Pain Managment: Goal: General experience of comfort will improve Outcome: Progressing   Problem: Skin Integrity: Goal: Risk for impaired skin integrity will decrease Outcome: Progressing   Problem: Education: Goal: Ability to demonstrate management of disease process will improve Outcome: Not Progressing   Problem: Elimination: Goal: Will not experience complications related to bowel motility Outcome: Completed/Met Goal: Will not experience complications related to urinary retention Outcome: Completed/Met

## 2018-08-27 NOTE — Progress Notes (Addendum)
Inpatient Diabetes Program Recommendations  AACE/ADA: New Consensus Statement on Inpatient Glycemic Control (2015)  Target Ranges:  Prepandial:   less than 140 mg/dL      Peak postprandial:   less than 180 mg/dL (1-2 hours)      Critically ill patients:  140 - 180 mg/dL   Lab Results  Component Value Date   GLUCAP 254 (H) 08/27/2018   HGBA1C 9.5 (H) 08/18/2018    Review of Glycemic Control Results for Colin Keller, Colin Keller (MRN [id number]) as of 08/27/2018 12:56  Ref. Range 08/26/2018 07:28 08/26/2018 11:47 08/26/2018 16:19 08/26/2018 19:38 08/26/2018 21:59 08/27/2018 06:12 08/27/2018 06:56 08/27/2018 12:07  Glucose-Capillary Latest Ref Range: 70 - 99 mg/dL 956104 (H) 213231 (H) 086425 (H) 308 (H) 280 (H) 53 (L) 97 254 (H)   Diabetes history: Type 1 DM  Outpatient Diabetes medications:  Tresiba 12 units daily, Novolog 3 units breakfast, Novolog 3 units lunch, and Novolog 7 units with supper Current orders for Inpatient glycemic control:  Levemir 20 units daily, TCTS tid with meals and HS, Novolog 3 units tid with meals Inpatient Diabetes Program Recommendations:   May consider reducing to moderate Novolog correction tid with meals and HS scale.  Called and discussed with PA, Erin Barrett.  Orders received.  Will follow.   Thanks,  Beryl MeagerJenny Chairty Toman, RN, BC-ADM Inpatient Diabetes Coordinator Pager [phone] (8a-5p)  Addendum 16:20:  Spoke with patient and wife regarding DM management.  Patient states that he was diagnosed with DM at age 62.  He has never been told that he has Type 1 DM.  We discussed that he appears to be sensitive to insulin doses given and omitted.  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  [facility] who has been "really helping with getting medications and dosing insulins".  We discussed current insulin regimen in the hospital.  Discussed adjustments.  Will follow-up on 9/17.  Also encouraged patient to discuss insulin  doses with RN's and to ask how much he is getting.

## 2018-08-27 NOTE — Progress Notes (Addendum)
[address]       [place] [id number]             [phone]      4 Days Post-Op Procedure(s) (LRB): CORONARY ARTERY BYPASS GRAFTING (CABG) x 3; -Left Internal Mammary Artery to Left Anterior Descending Artery, -Right Internal Mammary Artery to Right Coronary Artery, -Saphenous Vein Graft to Obtuse Marginal;  ENDOSCOPIC HARVEST GREATER SAPHENOUS VEIN  -Right Thigh (N/A) TRANSESOPHAGEAL ECHOCARDIOGRAM (TEE) (N/A)   Subjective:  No new complaints.  He is feeling better every day.  + ambulation without difficulty  + BM  Objective: Vital signs in last 24 hours: Temp:  [97.8 F (36.6 C)-98.7 F (37.1 C)] 98.7 F (37.1 C) (09/16 0509) Pulse Rate:  [90-98] 93 (09/16 0509) Cardiac Rhythm: Normal sinus rhythm (09/16 0102) Resp:  [14-27] 14 (09/16 0509) BP: (108-148)/(62-80) 134/67 (09/16 0509) SpO2:  [98 %-100 %] 98 % (09/16 0509) Weight:  [71.6 kg] 71.6 kg (09/16 0509)  Intake/Output from previous day: 09/15 0701 - 09/16 0700 In: 1140 [P.O.:1140] Out: 1650 [Urine:1650]  General appearance: alert, cooperative and no distress Heart: regular rate and rhythm and tachy Lungs: clear to auscultation bilaterally Abdomen: soft, non-tender; bowel sounds normal; no masses,  no organomegaly Extremities: edema trace Wound: clean and dry  Lab Results: Recent Labs    08/26/18 0321 08/27/18 0509  WBC 10.0 11.1*  HGB 7.9* 9.0*  HCT 24.3* 27.8*  PLT 160 240   BMET:  Recent Labs    08/26/18 0321 08/27/18 0509  NA 137 141  K 4.0 4.0  CL 109 107  CO2 21* 26  GLUCOSE 82 52*  BUN 30* 27*  CREATININE 1.38* 1.21  CALCIUM 8.4* 8.9    PT/INR: No results for input(s): LABPROT, INR in the last 72 hours. ABG    Component Value Date/Time   PHART 7.307 (L) 08/23/2018 1726   HCO3 19.0 (L) 08/23/2018 1726   TCO2 23 08/23/2018 1815   ACIDBASEDEF 7.0 (H) 08/23/2018 1726   O2SAT 96.0 08/23/2018 1726   CBG (last 3)  Recent Labs    08/26/18 2159 08/27/18 0612  08/27/18 0656  GLUCAP 280* 53* 97    Assessment/Plan: S/P Procedure(s) (LRB): CORONARY ARTERY BYPASS GRAFTING (CABG) x 3; -Left Internal Mammary Artery to Left Anterior Descending Artery, -Right Internal Mammary Artery to Right Coronary Artery, -Saphenous Vein Graft to Obtuse Marginal;  ENDOSCOPIC HARVEST GREATER SAPHENOUS VEIN  -Right Thigh (N/A) TRANSESOPHAGEAL ECHOCARDIOGRAM (TEE) (N/A)  1. CV- Sinus Tach, + HTN- will increase Lopressor to 25 mg BID, continue Lisinopril at 5 mg daily 2. Pulm- no acute issues, off oxygen, continue IS 3. Renal- creatinine at 1.2, weight remains elevated, will continue Lasix, potassium for now, repeat BMET in AM 4. Expected post operative blood loss anemia, mild Hgb at 9 5. CBGs-hypoglycemia, will decrease insulin, plan to resume home diabetic medications at discharge 6. Dispo- patient stable, titrate BB for better HR control, remains volume overloaded continue Lasix, potassium, if creatinine increases will plan to stop Lasix, d/c EPW today, if patient remains clinically stable may be ready for d/c in 24-48 hours   LOS: 9 days    Erin Barrett 08/27/2018   I have seen and examined the patient and agree with the assessment and plan as outlined.  Will decrease levemir and add Novolog meal coverage.  Will ask Diabetes Management team to get involved since patient had poor glycemic control preop w/ Hgb a1c 9.5  Purcell Nailslarence H Adlee Paar,  MD 08/27/2018 8:47 AM

## 2018-08-27 NOTE — Progress Notes (Signed)
Removed pacing wires per order and protocol, pt tolerated well, ends intact. Instructed on bed rest for 1 hour.  Theophilus KindsMichala N Charon Akamine, RN

## 2018-08-28 LAB — GLUCOSE, CAPILLARY
GLUCOSE-CAPILLARY: 148 mg/dL — AB (ref 70–99)
GLUCOSE-CAPILLARY: 211 mg/dL — AB (ref 70–99)

## 2018-08-28 LAB — CBC
HCT: 24.9 % — ABNORMAL LOW (ref 39.0–52.0)
Hemoglobin: 8 g/dL — ABNORMAL LOW (ref 13.0–17.0)
MCH: 32.1 pg (ref 26.0–34.0)
MCHC: 32.1 g/dL (ref 30.0–36.0)
MCV: 100 fL (ref 78.0–100.0)
PLATELETS: 246 10*3/uL (ref 150–400)
RBC: 2.49 MIL/uL — AB (ref 4.22–5.81)
RDW: 13.2 % (ref 11.5–15.5)
WBC: 9.1 10*3/uL (ref 4.0–10.5)

## 2018-08-28 LAB — BASIC METABOLIC PANEL
Anion gap: 8 (ref 5–15)
BUN: 30 mg/dL — AB (ref 8–23)
CO2: 28 mmol/L (ref 22–32)
CREATININE: 1.44 mg/dL — AB (ref 0.61–1.24)
Calcium: 9 mg/dL (ref 8.9–10.3)
Chloride: 105 mmol/L (ref 98–111)
GFR calc non Af Amer: 50 mL/min — ABNORMAL LOW (ref >60)
GFR, EST AFRICAN AMERICAN: 58 mL/min — AB (ref >60)
Glucose, Bld: 164 mg/dL — ABNORMAL HIGH (ref 70–99)
Potassium: 4.3 mmol/L (ref 3.5–5.1)
SODIUM: 141 mmol/L (ref 135–145)

## 2018-08-28 MED ORDER — CLOPIDOGREL BISULFATE 75 MG PO TABS: 75.0000 mg | ORAL_TABLET | Freq: Every day | ORAL | 3 refills | Status: DC

## 2018-08-28 MED ORDER — LISINOPRIL 5 MG PO TABS: 5.0000 mg | ORAL_TABLET | Freq: Every day | ORAL | 3 refills | Status: DC

## 2018-08-28 MED ORDER — FERROUS SULFATE 325 (65 FE) MG PO TBEC: 325.0000 mg | DELAYED_RELEASE_TABLET | Freq: Two times a day (BID) | ORAL | 1 refills | Status: DC

## 2018-08-28 MED ORDER — ACETAMINOPHEN 500 MG PO TABS: 1000.0000 mg | ORAL_TABLET | Freq: Four times a day (QID) | ORAL | 0 refills | Status: DC | PRN

## 2018-08-28 MED ORDER — METOPROLOL TARTRATE 25 MG PO TABS: 25.0000 mg | ORAL_TABLET | Freq: Two times a day (BID) | ORAL | 3 refills | Status: DC

## 2018-08-28 MED ORDER — OXYCODONE HCL 10 MG PO TABS: 10.0000 mg | ORAL_TABLET | ORAL | 0 refills | Status: DC | PRN

## 2018-08-28 NOTE — Discharge Instructions (Signed)
Discharge Instructions:  1. You may shower, please wash incisions daily with soap and water and keep dry.  If you wish to cover wounds with dressing you may do so but please keep clean and change daily.  No tub baths or swimming until incisions have completely healed.  If your incisions become red or develop any drainage please call our office at [phone]  2. No Driving until cleared by Dr. Orvan Julywen's  office and you are no longer using narcotic pain medications  3. Monitor your weight daily.. Please use the same scale and weigh at same time... If you gain 5-10 lbs in 48 hours with associated lower extremity swelling, please contact our office at [phone]  4. Fever of 101.5 for at least 24 hours with no source, please contact our office at [phone]  5. Activity- up as tolerated, please walk at least 3 times per day.  Avoid strenuous activity, no lifting, pushing, or pulling with your arms over 8-10 lbs for a minimum of 6 weeks  6. If any questions or concerns arise, please do not hesitate to contact our office at [phone]    Diabetes Mellitus and Nutrition When you have diabetes (diabetes mellitus), it is very important to have healthy eating habits because your blood sugar (glucose) levels are greatly affected by what you eat and drink. Eating healthy foods in the appropriate amounts, at about the same times every day, can help you:  Control your blood glucose.  Lower your risk of heart disease.  Improve your blood pressure.  Reach or maintain a healthy weight.  Every person with diabetes is different, and each person has different needs for a meal plan. Your health care provider may recommend that you work with a diet and nutrition specialist (dietitian) to make a meal plan that is best for you. Your meal plan may vary depending on factors such as:  The calories you need.  The medicines you take.  Your weight.  Your blood glucose, blood pressure, and cholesterol  levels.  Your activity level.  Other health conditions you have, such as heart or kidney disease.  How do carbohydrates affect me? Carbohydrates affect your blood glucose level more than any other type of food. Eating carbohydrates naturally increases the amount of glucose in your blood. Carbohydrate counting is a method for keeping track of how many carbohydrates you eat. Counting carbohydrates is important to keep your blood glucose at a healthy level, especially if you use insulin or take certain oral diabetes medicines. It is important to know how many carbohydrates you can safely have in each meal. This is different for every person. Your dietitian can help you calculate how many carbohydrates you should have at each meal and for snack. Foods that contain carbohydrates include:  Bread, cereal, rice, pasta, and crackers.  Potatoes and corn.  Peas, beans, and lentils.  Milk and yogurt.  Fruit and juice.  Desserts, such as cakes, cookies, ice cream, and candy.  How does alcohol affect me? Alcohol can cause a sudden decrease in blood glucose (hypoglycemia), especially if you use insulin or take certain oral diabetes medicines. Hypoglycemia can be a life-threatening condition. Symptoms of hypoglycemia (sleepiness, dizziness, and confusion) are similar to symptoms of having too much alcohol. If your health care provider says that alcohol is safe for you, follow these guidelines:  Limit alcohol intake to no more than 1 drink per day for nonpregnant women and 2 drinks per day for men. One drink equals 12 oz of  beer, 5 oz of wine, or 1 oz of hard liquor.  Do not drink on an empty stomach.  Keep yourself hydrated with water, diet soda, or unsweetened iced tea.  Keep in mind that regular soda, juice, and other mixers may contain a lot of sugar and must be counted as carbohydrates.  What are tips for following this plan? Reading food labels  Start by checking the serving size on the  label. The amount of calories, carbohydrates, fats, and other nutrients listed on the label are based on one serving of the food. Many foods contain more than one serving per package.  Check the total grams (g) of carbohydrates in one serving. You can calculate the number of servings of carbohydrates in one serving by dividing the total carbohydrates by 15. For example, if a food has 30 g of total carbohydrates, it would be equal to 2 servings of carbohydrates.  Check the number of grams (g) of saturated and trans fats in one serving. Choose foods that have low or no amount of these fats.  Check the number of milligrams (mg) of sodium in one serving. Most people should limit total sodium intake to less than 2,300 mg per day.  Always check the nutrition information of foods labeled as "low-fat" or "nonfat". These foods may be higher in added sugar or refined carbohydrates and should be avoided.  Talk to your dietitian to identify your daily goals for nutrients listed on the label. Shopping  Avoid buying canned, premade, or processed foods. These foods tend to be high in fat, sodium, and added sugar.  Shop around the outside edge of the grocery store. This includes fresh fruits and vegetables, bulk grains, fresh meats, and fresh dairy. Cooking  Use low-heat cooking methods, such as baking, instead of high-heat cooking methods like deep frying.  Cook using healthy oils, such as olive, canola, or sunflower oil.  Avoid cooking with butter, cream, or high-fat meats. Meal planning  Eat meals and snacks regularly, preferably at the same times every day. Avoid going long periods of time without eating.  Eat foods high in fiber, such as fresh fruits, vegetables, beans, and whole grains. Talk to your dietitian about how many servings of carbohydrates you can eat at each meal.  Eat 4-6 ounces of lean protein each day, such as lean meat, chicken, fish, eggs, or tofu. 1 ounce is equal to 1 ounce of  meat, chicken, or fish, 1 egg, or 1/4 cup of tofu.  Eat some foods each day that contain healthy fats, such as avocado, nuts, seeds, and fish. Lifestyle   Check your blood glucose regularly.  Exercise at least 30 minutes 5 or more days each week, or as told by your health care provider.  Take medicines as told by your health care provider.  Do not use any products that contain nicotine or tobacco, such as cigarettes and e-cigarettes. If you need help quitting, ask your health care provider.  Work with a Social worker or diabetes educator to identify strategies to manage stress and any emotional and social challenges. What are some questions to ask my health care provider?  Do I need to meet with a diabetes educator?  Do I need to meet with a dietitian?  What number can I call if I have questions?  When are the best times to check my blood glucose? Where to find more information:  American Diabetes Association: diabetes.org/food-and-fitness/food  Academy of Nutrition and Dietetics: PokerClues.dk  [employer] of Diabetes and  Digestive and Kidney Diseases (NIH): FindJewelers.cz Summary  A healthy meal plan will help you control your blood glucose and maintain a healthy lifestyle.  Working with a diet and nutrition specialist (dietitian) can help you make a meal plan that is best for you.  Keep in mind that carbohydrates and alcohol have immediate effects on your blood glucose levels. It is important to count carbohydrates and to use alcohol carefully. This information is not intended to replace advice given to you by your health care provider. Make sure you discuss any questions you have with your health care provider. Document Released: 08/25/2005 Document Revised: 01/02/2017 Document Reviewed: 01/02/2017 Elsevier Interactive Patient Education  [employer].

## 2018-08-28 NOTE — Progress Notes (Signed)
CARDIAC REHAB PHASE I   Offered to walk with pt, pt states he has been walking independently in hallway without any difficulty. Pt and wife educated on need for daily showering and monitoring of incisions. Reinforced sternal precautions, in-the-tube sheet given. Pts wife said she will be off of work this week to be at home with the pt. Pt given heart healthy and diabetic diets. Reviewed restrictions and exercise guidelines. Encouraged continued IS use. Will refer to CRP II [place].   [id number] Reynold Boweneresa  Lark Runk, RN BSN 08/28/2018 11:00 AM

## 2018-08-28 NOTE — Progress Notes (Signed)
Removed CT sutures and applied benzoin and 1/2 " steri strips.  Pt tolerated procedure well. Pt given signs and symptoms of infection. Ciclaly Mulcahey McClintock, RN    

## 2018-08-28 NOTE — Progress Notes (Signed)
Notified by CCMD at 2246 that patient had 6 beat burst of SVT at 1942. Patient asymptomatic. Will continue to monitor

## 2018-08-28 NOTE — Care Management Note (Signed)
Case Management Note Donn PieriniKristi Lucianna Ostlund RN, BSN Unit 4E- RN Care Coordinator  [phone]  Patient Details  Name: Colin KaufmanDavid W Keller MRN: [id number] Date of Birth: 1956/02/01  Subjective/Objective:  Pt admitted s/p CABGx3                  Action/Plan: PTA pt lived at home with wife, indenpendent, has PCP- [name], No DME or transition needs noted for transition home.   Expected Discharge Date:  08/28/18               Expected Discharge Plan:  Home/Self Care  In-House Referral:  NA  Discharge planning Services  CM Consult  Post Acute Care Choice:  NA Choice offered to:  NA  DME Arranged:    DME Agency:     HH Arranged:    HH Agency:     Status of Service:  Completed, signed off  If discussed at Long Length of Stay Meetings, dates discussed:    Discharge Disposition: home/self care   Additional Comments:  Darrold SpanWebster, Daray Polgar Hall, RN 08/28/2018, 11:20 AM

## 2018-08-28 NOTE — Progress Notes (Signed)
Spoke briefly with patient.  Blood sugars improved this morning.  Encouraged him to call Pharm D- Jacqualin CombesPete Kovall regarding insulin doses and potential need for increase once home. He states that he plans to do that once home.   Reminded him of the importance of glycemic control for wound healing and the prevention of hypoglycemia.   Thanks,  Beryl MeagerJenny Jermane Brayboy, RN, BC-ADM Inpatient Diabetes Coordinator Pager [phone] (8a-5p)

## 2018-08-28 NOTE — Progress Notes (Addendum)
[address]       [place] [address]             [phone]      5 Days Post-Op Procedure(s) (LRB): CORONARY ARTERY BYPASS GRAFTING (CABG) x 3; -Left Internal Mammary Artery to Left Anterior Descending Artery, -Right Internal Mammary Artery to Right Coronary Artery, -Saphenous Vein Graft to Obtuse Marginal;  ENDOSCOPIC HARVEST GREATER SAPHENOUS VEIN  -Right Thigh (N/A) TRANSESOPHAGEAL ECHOCARDIOGRAM (TEE) (N/A)   Subjective:  No new complaints.  Feels great and is hoping to go home.  He denies blood in his stool.  + ambulation  + BM  Objective: Vital signs in last 24 hours: Temp:  [98.1 F (36.7 C)-98.4 F (36.9 C)] 98.1 F (36.7 C) (09/17 0418) Pulse Rate:  [86-98] 86 (09/17 0418) Cardiac Rhythm: Normal sinus rhythm;Sinus tachycardia (09/16 2000) Resp:  [20-21] 20 (09/17 0418) BP: (95-148)/(62-74) 127/71 (09/17 0418) SpO2:  [97 %-100 %] 100 % (09/17 0418) Weight:  [71.5 kg] 71.5 kg (09/17 0418)  Intake/Output from previous day: 09/16 0701 - 09/17 0700 In: 3 [I.V.:3] Out: -   General appearance: alert, cooperative and no distress Heart: regular rate and rhythm and tachy Lungs: clear to auscultation bilaterally Abdomen: soft, non-tender; bowel sounds normal; no masses,  no organomegaly Extremities: edema minimal  Wound: clean and dry  Lab Results: Recent Labs    08/27/18 0509 08/28/18 0324  WBC 11.1* 9.1  HGB 9.0* 8.0*  HCT 27.8* 24.9*  PLT 240 246   BMET:  Recent Labs    08/27/18 0509 08/28/18 0324  NA 141 141  K 4.0 4.3  CL 107 105  CO2 26 28  GLUCOSE 52* 164*  BUN 27* 30*  CREATININE 1.21 1.44*  CALCIUM 8.9 9.0    PT/INR: No results for input(s): LABPROT, INR in the last 72 hours. ABG    Component Value Date/Time   PHART 7.307 (L) 08/23/2018 1726   HCO3 19.0 (L) 08/23/2018 1726   TCO2 23 08/23/2018 1815   ACIDBASEDEF 7.0 (H) 08/23/2018 1726   O2SAT 96.0 08/23/2018 1726   CBG (last 3)  Recent Labs    08/27/18 1656  08/27/18 2106 08/28/18 0633  GLUCAP 103* 261* 148*    Assessment/Plan: S/P Procedure(s) (LRB): CORONARY ARTERY BYPASS GRAFTING (CABG) x 3; -Left Internal Mammary Artery to Left Anterior Descending Artery, -Right Internal Mammary Artery to Right Coronary Artery, -Saphenous Vein Graft to Obtuse Marginal;  ENDOSCOPIC HARVEST GREATER SAPHENOUS VEIN  -Right Thigh (N/A) TRANSESOPHAGEAL ECHOCARDIOGRAM (TEE) (N/A)  1. CV- Sinus Tach with 6 beat run of WCT overnight- continue Lopressor, Lisinopril.... 2. Pulm- no acute issues, continue IS 3. Renal- creatinine up slightly to 1.40, likely due to diuretics,  will stop Lasix as weight is at admission, minimal edema on exam 4. Expected blood loss anemia, Hgb dropped to 8.0 this morning but essentially unchanged since 9/14, no evidence of bleeding on exam, denies blood in BM, continue iron supplement 5. DM- uncontrolled preoperatively, no hypoglycemia overnight, sugars were elevated at times yesterday, plan per diabetes coordinator 6. Creatinine elevated again likely due to diuretics will stop Lasix, diabetes medication adjusted per coordinator, continue recommendations and continue to hold metformin.     LOS: 10 days    Lowella Dandy 08/28/2018  I have seen and examined the patient and agree with the assessment and plan as outlined.  Looks okay for d/c home.  Will need follow up within 1-2 weeks for diabetes management and to  recheck renal function   Purcell Nailslarence H Owen, MD 08/28/2018 8:33 AM

## 2018-08-29 ENCOUNTER — Telehealth: Payer: Self-pay | Admitting: Pharmacist

## 2018-08-29 ENCOUNTER — Telehealth (HOSPITAL_COMMUNITY): Payer: Self-pay

## 2018-08-29 NOTE — Telephone Encounter (Signed)
Returned call from previous day.   Follow CABG patient requested updated insulin regimen.   Asked to call back.  I will plan to try again within the next 24 hours.

## 2018-08-29 NOTE — Telephone Encounter (Signed)
Wife patient returned phone and stated patient does not have insurance. I informed wife that patient is eligible to participate in the Maintenance program. She stated she will if he can do it. Closed referral.

## 2018-08-29 NOTE — Telephone Encounter (Signed)
Attempted to call patient to verify insurance, unable to reach. LMTCB

## 2018-08-30 ENCOUNTER — Telehealth: Payer: Self-pay | Admitting: Pharmacist

## 2018-08-30 ENCOUNTER — Ambulatory Visit: Payer: Self-pay | Admitting: Pharmacist

## 2018-08-30 DIAGNOSIS — Z794 Long term (current) use of insulin: Secondary | ICD-10-CM

## 2018-08-30 DIAGNOSIS — E1165 Type 2 diabetes mellitus with hyperglycemia: Secondary | ICD-10-CM

## 2018-08-30 DIAGNOSIS — IMO0002 Reserved for concepts with insufficient information to code with codable children: Secondary | ICD-10-CM

## 2018-08-30 DIAGNOSIS — E1142 Type 2 diabetes mellitus with diabetic polyneuropathy: Secondary | ICD-10-CM

## 2018-08-30 MED ORDER — INSULIN DEGLUDEC 100 UNIT/ML ~~LOC~~ SOPN: 18.0000 [IU] | PEN_INJECTOR | SUBCUTANEOUS | 0 refills | Status: DC

## 2018-08-30 MED ORDER — INSULIN ASPART 100 UNIT/ML ~~LOC~~ SOLN: 5.0000 [IU] | Freq: Two times a day (BID) | SUBCUTANEOUS | 0 refills | Status: DC

## 2018-08-30 NOTE — Telephone Encounter (Signed)
Chart reviewed. There was a one time temazepam ordered prn sleep pre op. However it was never adminstered. Would not start a sleep aid at this time. We had extensively discussed this during an office visit in June.

## 2018-08-30 NOTE — Telephone Encounter (Signed)
Phone call follow up after CABG for elevated blood glucose readings.  Patient reports that his blood sugars have been high since returning home after his recent 3 vessel CABG procedure.  He reported one "hi reading" and other readings higher than 316 which was the lowest since discharge on two days ago, this morning. Instructed to increase Tresiba from 12 to 18 units daily and increase his sliding scale by 1 unit 4-7 will now be 5-8 with meals.    He reports complete abstinence from tobacco following hospitalization for his heart attach and 3 vessel CABG.  Encouraged continued abstinence.     Plan follow up tomorrow AM for further insulin adjustment.     Question for PCP - deferred.  During his hospitalization he was provided a sleeping pill.  "Would a sleeping pill, like a Xanax - low dose, help?" - requested.    I stated I would share this request to Dr. [name].

## 2018-08-31 ENCOUNTER — Telehealth: Payer: Self-pay | Admitting: Pharmacist

## 2018-08-31 DIAGNOSIS — Z794 Long term (current) use of insulin: Secondary | ICD-10-CM

## 2018-08-31 DIAGNOSIS — E1142 Type 2 diabetes mellitus with diabetic polyneuropathy: Secondary | ICD-10-CM

## 2018-08-31 DIAGNOSIS — E1165 Type 2 diabetes mellitus with hyperglycemia: Secondary | ICD-10-CM

## 2018-08-31 DIAGNOSIS — IMO0002 Reserved for concepts with insufficient information to code with codable children: Secondary | ICD-10-CM

## 2018-08-31 MED ORDER — INSULIN DEGLUDEC 100 UNIT/ML ~~LOC~~ SOPN: 25.0000 [IU] | PEN_INJECTOR | SUBCUTANEOUS | 0 refills | Status: DC

## 2018-08-31 MED ORDER — INSULIN ASPART 100 UNIT/ML ~~LOC~~ SOLN: 8.0000 [IU] | Freq: Two times a day (BID) | SUBCUTANEOUS | 0 refills | Status: DC

## 2018-08-31 NOTE — Telephone Encounter (Signed)
Called patient in f/u of elevated blood sugar.   Reports home reading a few minutes prior to the call was 466.  He reports taking Tresiba at 22 units and taking Novolog at 10 units this AM.   We discussed how his blood sugar may still be elevated due to stress and pain.  We did discuss pain management decisions will be determined by Dr. [name].   Plan: Increase Tresiba to 25 units tomorrow AM Increase Novolog to 12 units with each meal until blood sugar is back into SSI range.   He was comfortable with higher doses.   F/U phone call planned for Monday AM.

## 2018-08-31 NOTE — Telephone Encounter (Signed)
Talked with patient's wife. Discussed trial of melatonin for sleep aid. No role for benzodiazepines at home.

## 2018-08-31 NOTE — Telephone Encounter (Signed)
Clarification: I can send in a refill if needed.

## 2018-08-31 NOTE — Telephone Encounter (Signed)
Called patient. Discussed stopping oxycodone and going back to his home norco. He has some at home and will call when he needs a refill.

## 2018-08-31 NOTE — Telephone Encounter (Signed)
Patient should be back on his previous pain medication dosing. If he is out of refills then would be good to send a refill in to patient pharmacy.

## 2018-08-31 NOTE — Telephone Encounter (Signed)
Follow-up on blood glucose.  Patient reported a "Hi" reading yesterday and this AM it was reported as 416.   Blood Glucose Plan:   Increase your Tresiba from 18 units to 22 units this AM AND take 10 units of Novolog this morning.  I will plan to call him later today AND discuss a blood sugar plan for the weekend.  Call back planned for 1:00PM today.   PAIN: He also reported taking 10mg  of oxycodone only once at night and then one additional during the day yesterday.  We discussed how this is less than his previous Hydrocodone/ APAP 10/325 TID.   He was encouraged to take additional pain medication with oxycodone to improve his pain control (currently pain level of 5-6/10).   He noted this does help him rest and he noted he took a nap after his dose yesterday during the day.   Social Note:  Patient's sister had a child with muscular dystrophy pass away this AM.  He received the call at 7:00.   He reports this will will add to his stress today.

## 2018-09-03 ENCOUNTER — Telehealth: Payer: Self-pay | Admitting: Pharmacist

## 2018-09-03 DIAGNOSIS — Z794 Long term (current) use of insulin: Secondary | ICD-10-CM

## 2018-09-03 DIAGNOSIS — IMO0002 Reserved for concepts with insufficient information to code with codable children: Secondary | ICD-10-CM

## 2018-09-03 DIAGNOSIS — E1142 Type 2 diabetes mellitus with diabetic polyneuropathy: Secondary | ICD-10-CM

## 2018-09-03 DIAGNOSIS — E1165 Type 2 diabetes mellitus with hyperglycemia: Secondary | ICD-10-CM

## 2018-09-03 MED ORDER — INSULIN DEGLUDEC 100 UNIT/ML ~~LOC~~ SOPN: 14.0000 [IU] | PEN_INJECTOR | SUBCUTANEOUS | 0 refills | Status: DC

## 2018-09-03 MED ORDER — INSULIN ASPART 100 UNIT/ML ~~LOC~~ SOLN: 4.0000 [IU] | Freq: Two times a day (BID) | SUBCUTANEOUS | 0 refills | Status: DC

## 2018-09-03 NOTE — Telephone Encounter (Signed)
Attempted call at 10:57 - no answer Contacted at 12:03  Patient reports blood sugar over the weekend were much improved to the low 200s since changing his pain medication from oxycodone and returning to his hydrocodone/APAP TID.   States his current pain is in good control today.    Patient reports self-reducing over the weekend by to Tresiba 14 units and Novolog 8 units with meals.     His pre-surgery insulin doses were Tresiba 12 and Novolog 4-7 on a sliding scale.    He was instructed to call me if his blood sugars are elevated > 300 or if he had an significant low readings.   He will f/u with his heart doctor in  8 days.

## 2018-09-05 ENCOUNTER — Other Ambulatory Visit: Payer: Self-pay

## 2018-09-05 ENCOUNTER — Telehealth: Payer: Self-pay | Admitting: Family Medicine

## 2018-09-05 ENCOUNTER — Encounter: Payer: Self-pay | Admitting: Family Medicine

## 2018-09-05 ENCOUNTER — Telehealth: Payer: Self-pay

## 2018-09-05 ENCOUNTER — Ambulatory Visit (INDEPENDENT_AMBULATORY_CARE_PROVIDER_SITE_OTHER): Payer: Self-pay | Admitting: Family Medicine

## 2018-09-05 VITALS — BP 110/60 | HR 65 | Temp 98.6°F | Wt 158.2 lb

## 2018-09-05 DIAGNOSIS — Z951 Presence of aortocoronary bypass graft: Secondary | ICD-10-CM

## 2018-09-05 DIAGNOSIS — G8929 Other chronic pain: Secondary | ICD-10-CM

## 2018-09-05 DIAGNOSIS — M545 Low back pain, unspecified: Secondary | ICD-10-CM

## 2018-09-05 DIAGNOSIS — N183 Chronic kidney disease, stage 3 unspecified: Secondary | ICD-10-CM

## 2018-09-05 DIAGNOSIS — E1165 Type 2 diabetes mellitus with hyperglycemia: Secondary | ICD-10-CM

## 2018-09-05 DIAGNOSIS — I10 Essential (primary) hypertension: Secondary | ICD-10-CM

## 2018-09-05 MED ORDER — TRAMADOL HCL 50 MG PO TABS: 50.0000 mg | ORAL_TABLET | Freq: Four times a day (QID) | ORAL | 0 refills | Status: DC | PRN

## 2018-09-05 NOTE — Telephone Encounter (Signed)
Pt is calling to see what type of pain medication is going to be called in. Dr. Nelson ChimesAmin didn't fill his prescription . He has enough medication for 2 days so please call and update him on what the plan is.house or cell jw

## 2018-09-05 NOTE — Progress Notes (Addendum)
Subjective:   Patient ID: Colin Keller    DOB: [birth year], 62 y.o. male   MRN: [id number]  CC: hospital follow-up   HPI: Colin Keller is a 62 y.o. male who presents to clinic today for the following issue.  CP s/p CABGx3  Patient was admitted on 08/18/18 for chest pain and discharged in stable condition.  During hospitalization found to have an NSTEMI and cardiac cath showed moderately severe distal L main stenosis.  He was taken for CABG x3.  He presents today for follow up after discharge and states he has been doing well since leaving the hospital.  Has not had any chest pain or shortness of breath.  He has made several lifestyle modifications in attempt to change his life after heart surgery.  Decided to quit smoking as he is aware of the risks associated with this.  Is motivated to control his blood sugars better as he knows good diabetic control will help him in the long run.    T2DM Last A1c 9.5 on 08/18/2018  -Medications: tresiba 14 u daily, novolog 8u with meals   -Checking sugars: TID everyday  -Ranges at home:  ranges 130-200 -Diet: avoiding complex carbohydrates, avoids fruits bc of sugars loves all kind of vegetables, has cut down on fast food  -Exercise: walking daily around the house  -Annual diabetic eye exam: sees [place] [facility] and is to make an appointment  -Recent foot exam with Dr. Artist Pais   ROS: No fevers, cough, nausea, vomiting.  No chest pain, shortness of breath, palpitations.  Social: Patient is a former tobacco user, quit September 2018 Medications reviewed. Objective:   BP 110/60 (BP Location: Right Arm, Patient Position: Sitting, Cuff Size: Normal)   Pulse 65   Temp 98.6 F (37 C) (Oral)   Wt 158 lb 3.2 oz (71.8 kg)   SpO2 97%   BMI 22.06 kg/m  Vitals and nursing note reviewed.  General: 62 year old male, NAD HEENT: NCAT, EOMI, PERRL, MMM, oropharynx clear  Neck: supple, non-tender, normal ROM, no LAD  CV: RRR no MRG, anterior chest wall with  well-healing midline incision s/p CABG x3  Lungs: CTAB, normal effort  Abdomen: soft, NTND, +bs  Skin: warm, dry, no rash  Extremities: warm and well perfused Neuro: alert, oriented x3, no focal deficits   Assessment & Plan:   DM (diabetes mellitus), type 2, uncontrolled (HCC) WIll hold off on repeating A1c today as last A1c 9.5 on 08/18/2018.  Continue on Tresiba and NovoLog, continue checking CBGs 3 times daily.  Commended him on his efforts to change his diet and start exercising.  Follow-up in 3 months.  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  Stable, no ongoing chest pain since hospital discharge.  Anterior chest wall with well-healing midline incision.  Following up outpatient with cardiology.  Encounter for chronic pain management During encounter, wife adamant about refilling his pain meds at this time.   Husband refused wanting any and stated he would follow up with Dr. Artist Pais. Wife stated Dr. Artist Pais had given the OK to refill at this followup.  I advised them to call front office and have them leave her a message and PCP may refill electronically if felt appropriate.  They were later overheard arguing in the room as patient stated to his wife "if you hadn't been chomping on them pills like they were candy, you wouldn't have  to ask for more."  Conversation confirmed by Dr. Pascal Lux on video precepting which patient and wife had agreed to at start of visit.  Concern she may be the one using his narcotics at home.  Would recommend close monitoring.    Orders Placed This Encounter  Procedures  . Basic Metabolic Panel   Freddrick March, MD [facility], PGY-3 09/13/2018 5:25 PM

## 2018-09-05 NOTE — Telephone Encounter (Signed)
Patient's wife called requesting a re-fill of pain medication for patient following discharge of CABG with Dr. Cornelius Moras on 08/23/18.  Per Dr. Cornelius Moras patient could have prescription for Tramadol.  Prescription was faxed to patient's pharmacy, [facility].  Patient's wife acknowledged receipt.

## 2018-09-05 NOTE — Patient Instructions (Addendum)
It was nice meeting you today.  You were seen in clinic for a hospital follow-up and seem to be doing great.  I have ordered some blood work to check your kidney function and will call you once to have the results of this.  Additionally, we discussed your diabetes and A1c of 9.5.  I would like you to follow-up with Dr. Raymondo Band as you may need adjustment of your current regimen for better control with goal of <7.  Please make an appointment with your ophthalmologist for an annual diabetic eye exam.  You may call clinic if you have any questions.  Freddrick March MD

## 2018-09-06 LAB — BASIC METABOLIC PANEL
BUN / CREAT RATIO: 32 — AB (ref 10–24)
BUN: 49 mg/dL — AB (ref 8–27)
CO2: 23 mmol/L (ref 20–29)
CREATININE: 1.54 mg/dL — AB (ref 0.76–1.27)
Calcium: 9.6 mg/dL (ref 8.6–10.2)
Chloride: 99 mmol/L (ref 96–106)
GFR calc Af Amer: 54 mL/min/{1.73_m2} — ABNORMAL LOW (ref >59)
GFR, EST NON AFRICAN AMERICAN: 47 mL/min/{1.73_m2} — AB (ref >59)
GLUCOSE: 345 mg/dL — AB (ref 65–99)
Potassium: 5.1 mmol/L (ref 3.5–5.2)
SODIUM: 138 mmol/L (ref 134–144)

## 2018-09-06 MED ORDER — HYDROCODONE-ACETAMINOPHEN 10-325 MG PO TABS: 1.0000 | ORAL_TABLET | Freq: Three times a day (TID) | ORAL | 0 refills | Status: DC | PRN

## 2018-09-06 NOTE — Telephone Encounter (Signed)
Spoke with Dr. Nelson Chimes who evaluated this patient during his hospital follow-up.  There is some concern, unconfirmed, of patient's wife diverting his chronic pain medications.  However patient is no red flags on his PMP and he has never run out of his prescriptions early. Precepted.  Will refill today however at his next regularly scheduled follow-up he will need a urine drug screen and a conversation regarding our controlled substance policy.Marland Kitchen

## 2018-09-10 ENCOUNTER — Telehealth: Payer: Self-pay | Admitting: Pharmacist

## 2018-09-10 NOTE — Telephone Encounter (Signed)
Patient calls reporting he mistakenly took an extra dose of his LONG-acting insulin Evaristo Bury) last night.  He reports 14 units both yesterday AM and then he mistakenly took an extra 14 units prior to his evening meal.    He denies any hypoglycemia since this "double dose".   He reported an AM blood sugar of 95 followed by 126 a few hours later (after coffee) today.     He was advised to skip Tresiba doses until this PM - instructed to take 7 units this evening AND then resume his past schedule of Tresiba at 14 units daily tomorrow AM.   I reassured him that his blood sugar of 95 was acceptable.   He verbalized understanding of treatment plan.   Follow-up PRN.

## 2018-09-11 ENCOUNTER — Ambulatory Visit (INDEPENDENT_AMBULATORY_CARE_PROVIDER_SITE_OTHER): Payer: Self-pay | Admitting: Physician Assistant

## 2018-09-11 ENCOUNTER — Telehealth: Payer: Self-pay | Admitting: *Deleted

## 2018-09-11 ENCOUNTER — Encounter: Payer: Self-pay | Admitting: Physician Assistant

## 2018-09-11 VITALS — BP 122/62 | HR 60 | Ht 71.0 in | Wt 157.0 lb

## 2018-09-11 DIAGNOSIS — N289 Disorder of kidney and ureter, unspecified: Secondary | ICD-10-CM

## 2018-09-11 DIAGNOSIS — I25810 Atherosclerosis of coronary artery bypass graft(s) without angina pectoris: Secondary | ICD-10-CM

## 2018-09-11 DIAGNOSIS — E875 Hyperkalemia: Secondary | ICD-10-CM

## 2018-09-11 DIAGNOSIS — D500 Iron deficiency anemia secondary to blood loss (chronic): Secondary | ICD-10-CM

## 2018-09-11 DIAGNOSIS — E119 Type 2 diabetes mellitus without complications: Secondary | ICD-10-CM

## 2018-09-11 DIAGNOSIS — R7989 Other specified abnormal findings of blood chemistry: Secondary | ICD-10-CM

## 2018-09-11 DIAGNOSIS — Z794 Long term (current) use of insulin: Secondary | ICD-10-CM

## 2018-09-11 DIAGNOSIS — I10 Essential (primary) hypertension: Secondary | ICD-10-CM

## 2018-09-11 DIAGNOSIS — E785 Hyperlipidemia, unspecified: Secondary | ICD-10-CM

## 2018-09-11 LAB — CBC
HEMATOCRIT: 27.8 % — AB (ref 37.5–51.0)
Hemoglobin: 8.6 g/dL — ABNORMAL LOW (ref 13.0–17.7)
MCH: 30.5 pg (ref 26.6–33.0)
MCHC: 30.9 g/dL — AB (ref 31.5–35.7)
MCV: 99 fL — ABNORMAL HIGH (ref 79–97)
Platelets: 578 10*3/uL — ABNORMAL HIGH (ref 150–450)
RBC: 2.82 x10E6/uL — ABNORMAL LOW (ref 4.14–5.80)
RDW: 13.4 % (ref 12.3–15.4)
WBC: 8.3 10*3/uL (ref 3.4–10.8)

## 2018-09-11 LAB — BASIC METABOLIC PANEL
BUN / CREAT RATIO: 26 — AB (ref 10–24)
BUN: 42 mg/dL — ABNORMAL HIGH (ref 8–27)
CHLORIDE: 97 mmol/L (ref 96–106)
CO2: 20 mmol/L (ref 20–29)
Calcium: 9.7 mg/dL (ref 8.6–10.2)
Creatinine, Ser: 1.6 mg/dL — ABNORMAL HIGH (ref 0.76–1.27)
GFR, EST AFRICAN AMERICAN: 52 mL/min/{1.73_m2} — AB (ref >59)
GFR, EST NON AFRICAN AMERICAN: 45 mL/min/{1.73_m2} — AB (ref >59)
Glucose: 420 mg/dL — ABNORMAL HIGH (ref 65–99)
POTASSIUM: 6.4 mmol/L — AB (ref 3.5–5.2)
Sodium: 135 mmol/L (ref 134–144)

## 2018-09-11 NOTE — Patient Instructions (Addendum)
Medication Instructions:  Your physician recommends that you continue on your current medications as directed. Please refer to the Current Medication list given to you today.  If you need a refill on your cardiac medications before your next appointment, please call your pharmacy.  Labwork: Your physician recommends that you return for lab work in: TODAY-BMET, [employer] in our office  Testing/Procedures: NONE   Follow-Up: Your physician recommends that you schedule a follow-up appointment in: 2 MONTHS WITH DR CRENSHAW REFERRAL TO LIPID CLINIC FOR PCSK9 Any Other Special Instructions Will Be Listed Below (If Applicable).

## 2018-09-11 NOTE — Telephone Encounter (Signed)
SPOKE TO PATIENT,   HAO REVIEWED  LABS -- PER HAO, STOP LISINOPRIL, RECHECK  CBC AND BMET ON Friday -09/14/18   PATIENT VOICED UNDERSTANDING AND STATES HE WILL BY HERE BY 8:30 AM.

## 2018-09-11 NOTE — Telephone Encounter (Signed)
Called spoke to crystal - [facility] reading of potassium  6.4     PATIENT WAS SEEN EARLIER TODAY. NOTIFIED  HAO  MENG PA.

## 2018-09-11 NOTE — Progress Notes (Signed)
Cardiology Office Note    Date:  09/11/2018   ID:  Colin Keller, DOB Jan 11, 1956, MRN [id number]  PCP:  Colin Her, DO  Cardiologist:  Dr. Jens Keller   Chief Complaint  Patient presents with  . Hospitalization Follow-up    post CABG, seen for Dr. Jens Keller.     History of Present Illness:  Colin Keller is a 62 y.o. male with PMH of HTN, HLD, GERD, DM II who was recently admitted to the hospital in September 2019 for chest pain.  EKG shows normal sinus rhythm, with LVH and ST depression.  Initial troponin was positive at 0.38.  Patient also has family history of CAD as well.  He was found to have acute heart failure with elevated BNP and pleural effusion.  Initial echocardiogram obtained on 08/19/2018 showed EF 50 to 55%, grade 1 DD, peak PA pressure 9 mmHg.  Patient underwent cardiac catheterization on 08/20/2018 which showed 60% mid left main lesion, not 30% ostial LAD, 50% proximal to mid LAD, 99% ostial left circumflex lesion, 95% proximal to mid RCA lesion.  FFR along the distal left main was positive at a 0.75.  Patient was referred for surgical evaluation for bypass surgery.  Pre-CABG Doppler obtained on 08/21/2018 showed 40 to 59% bilateral ICA stenosis.  Patient eventually underwent CABG x3 with LIMA to LAD, RIMA to RCA, SVG to OM by Dr. Cornelius Keller.  LDL was 117 on admission, the plan is to refer the patient to the lipid clinic for consideration of Repatha.  He was eventually discharged on aspirin and Plavix after bypass surgery.  Noted hemoglobin A1c during this admission was 9.5.  Postop course had only 12 beats of atrial fibrillation, therefore she was not anticoagulated.  Patient presents today for cardiology office visit.  His chest is still sore and he is dealing with some insomnia and a loss of appetite, otherwise he is doing quite well from cardiology perspective.  He denies any exertional shortness of breath.  The sternotomy wound is healing quite well.  Otherwise he has been compliant with  his medication.  He is following up with his primary care provider to address the uncontrolled diabetes.  I will refer him to the lipid clinic for consideration of PCSK9 inhibitor or any trials that can lower his cholesterol.  After he is seen by Dr. Cornelius Keller of CT surgery, he can start on cardiac rehab.  I will obtain a CBC to follow-up on his anemia today.  I will also obtain a basic metabolic panel since his renal function seems to be a little worse on the lisinopril.  Past Medical History:  Diagnosis Date  . Diabetes mellitus   . GERD (gastroesophageal reflux disease)   . Hyperlipidemia   . Hypertension   . 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 Tobacco abuse   . Tobacco abuse disorder    Qualifier: Diagnosis of  By: Colin Mosses MD, Colin Keller      Past Surgical History:  Procedure Laterality Date  . CORONARY ARTERY BYPASS GRAFT N/A 08/23/2018   Procedure: CORONARY ARTERY BYPASS GRAFTING (CABG) x 3; -Left Internal Mammary Artery to Left Anterior Descending Artery, -Right Internal Mammary Artery to Right Coronary Artery, -Saphenous Vein Graft to Obtuse Marginal;  ENDOSCOPIC HARVEST GREATER SAPHENOUS VEIN  -Right Thigh;  Surgeon: Colin Nails, MD;  Location: [facility] OR;  Service: Open Heart Surgery;  Laterality: N/A;  .  INTRAVASCULAR PRESSURE WIRE/FFR STUDY N/A 08/20/2018   Procedure: INTRAVASCULAR PRESSURE WIRE/FFR STUDY;  Surgeon: Colin Bollman, MD;  Location: [facility] INVASIVE CV LAB;  Service: Cardiovascular;  Laterality: N/A;  . LEFT HEART CATH AND CORONARY ANGIOGRAPHY N/A 08/20/2018   Procedure: LEFT HEART CATH AND CORONARY ANGIOGRAPHY;  Surgeon: Colin Bollman, MD;  Location: [facility] INVASIVE CV LAB;  Service: Cardiovascular;  Laterality: N/A;  . TEE WITHOUT CARDIOVERSION N/A 08/23/2018   Procedure: TRANSESOPHAGEAL ECHOCARDIOGRAM (TEE);  Surgeon: Colin Nails, MD;  Location: [facility] OR;  Service: Open Heart Surgery;  Laterality: N/A;    Current  Medications: Outpatient Medications Prior to Visit  Medication Sig Dispense Refill  . acetaminophen (TYLENOL) 500 MG tablet Take 2 tablets (1,000 mg total) by mouth every 6 (six) hours as needed for mild pain. 30 tablet 0  . aspirin 81 MG tablet Take 81 mg by mouth daily.    . clopidogrel (PLAVIX) 75 MG tablet Take 1 tablet (75 mg total) by mouth daily. 30 tablet 3  . ferrous sulfate 325 (65 FE) MG EC tablet Take 1 tablet (325 mg total) by mouth 2 (two) times daily. 60 tablet 1  . HYDROcodone-acetaminophen (NORCO) 10-325 MG tablet Take 1 tablet by mouth every 8 (eight) hours as needed. 90 tablet 0  . insulin aspart (NOVOLOG) 100 UNIT/ML injection Inject 4-8 Units into the skin 2 (two) times daily with a meal. 4-5 units BKFST, 4-7 units with dinner 10 mL 0  . insulin degludec (TRESIBA FLEXTOUCH) 100 UNIT/ML SOPN FlexTouch Pen Inject 0.14 mLs (14 Units total) into the skin every morning. 2 pen 0  . lisinopril (PRINIVIL,ZESTRIL) 5 MG tablet Take 1 tablet (5 mg total) by mouth daily. 30 tablet 3  . Menthol, Topical Analgesic, (BIOFREEZE ROLL-ON) 4 % GEL Apply 1 application topically as needed (shoulder pain).    . metoprolol tartrate (LOPRESSOR) 25 MG tablet Take 1 tablet (25 mg total) by mouth 2 (two) times daily. 60 tablet 3  . pravastatin (PRAVACHOL) 20 MG tablet Take 1 tablet (20 mg total) by mouth 3 (three) times a week. 30 tablet 2  . ranitidine (ZANTAC) 75 MG tablet Take 75 mg by mouth 2 (two) times daily as needed for heartburn.    . traMADol (ULTRAM) 50 MG tablet Take 1 tablet (50 mg total) by mouth every 6 (six) hours as needed. 30 tablet 0  . ranitidine (ZANTAC) 75 MG tablet Take 150 mg by mouth daily as needed.     . tamsulosin (FLOMAX) 0.4 MG CAPS capsule Take 1 capsule (0.4 mg total) by mouth daily. 30 capsule 3   No facility-administered medications prior to visit.      Allergies:   Amlodipine; Amitriptyline; Carvedilol; Cyclobenzaprine; Nortriptyline hcl; and Simvastatin   Social  History   Socioeconomic History  . Marital status: Married    Spouse name: Not on file  . Number of children: Not on file  . Years of education: Not on file  . Highest education level: Not on file  Occupational History  . Not on file  Social Needs  . Financial resource strain: Not on file  . Food insecurity:    Worry: Not on file    Inability: Not on file  . Transportation needs:    Medical: Not on file    Non-medical: Not on file  Tobacco Use  . Smoking status: Former Smoker    Packs/day: 0.50    Years: 45.00    Pack years: 22.50    Types: Cigarettes  Start date: 08/12/1970    Last attempt to quit: 08/12/2018    Years since quitting: 0.0  . Smokeless tobacco: Never Used  . Tobacco comment: Quit for 4.5 months in August 2016  Substance and Sexual Activity  . Alcohol use: No    Alcohol/week: 0.0 standard drinks  . Drug use: No  . Sexual activity: Yes  Lifestyle  . Physical activity:    Days per week: Not on file    Minutes per session: Not on file  . Stress: Not on file  Relationships  . Social connections:    Talks on phone: Not on file    Gets together: Not on file    Attends religious service: Not on file    Active member of club or organization: Not on file    Attends meetings of clubs or organizations: Not on file    Relationship status: Not on file  Other Topics Concern  . 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 He and his wife were both laid off during recession. Wife Lawson Fiscal is also [facility] patient. Grown children. 1ppd smoker. Previously used marijuana -quit 10/03           Family History:  The patient's family history includes Diabetes in his mother; Heart disease in his father and mother; Heart failure in his mother; Sudden death in his brother.   ROS:   Please see the history of present illness.    ROS All other systems reviewed and are negative.   PHYSICAL EXAM:   VS:  BP 122/62   Pulse 60   Ht 5\' 11"  (1.803 m)   Wt  157 lb (71.2 kg)   BMI 21.90 kg/m    GEN: Well nourished, well developed, in no acute distress  HEENT: normal  Neck: no JVD, carotid bruits, or masses Cardiac: RRR; no murmurs, rubs, or gallops,no edema.  Sternotomy site is well-healed Respiratory:  clear to auscultation bilaterally, normal work of breathing GI: soft, nontender, nondistended, + BS MS: no deformity or atrophy  Skin: warm and dry, no rash Neuro:  Alert and Oriented x 3, Strength and sensation are intact Psych: euthymic mood, full affect  Wt Readings from Last 3 Encounters:  09/11/18 157 lb (71.2 kg)  09/05/18 158 lb 3.2 oz (71.8 kg)  08/28/18 157 lb 10.1 oz (71.5 kg)      Studies/Labs Reviewed:   EKG:  EKG is ordered today.  The ekg ordered today demonstrates normal sinus rhythm, persistent T wave inversion in inferolateral leads.  Recent Labs: 08/18/2018: B Natriuretic Peptide 1,083.3; TSH 0.333 08/22/2018: ALT 16 08/24/2018: Magnesium 2.7 08/28/2018: Hemoglobin 8.0; Platelets 246 09/05/2018: BUN 49; Creatinine, Ser 1.54; Potassium 5.1; Sodium 138   Lipid Panel    Component Value Date/Time   CHOL 198 08/19/2018 0603   TRIG 71 08/19/2018 0603   HDL 67 08/19/2018 0603   CHOLHDL 3.0 08/19/2018 0603   VLDL 14 08/19/2018 0603   LDLCALC 117 (H) 08/19/2018 0603   LDLDIRECT 105 09/19/2016 0922    Additional studies/ records that were reviewed today include:   Cath 08/20/2018  Mid LM to Dist LM lesion is 60% stenosed.  Ost LAD to Prox LAD lesion is 30% stenosed.  Prox LAD to Mid LAD lesion is 30% stenosed.  Ost Cx to Prox Cx lesion is 99% stenosed.  Prox RCA to Mid RCA lesion is 95% stenosed.   1.  Moderately severe distal left main stenosis with positive FFR of  0.75 2.  Critical heavily calcified mid RCA stenosis 3.  Critical heavily calcified proximal circumflex stenosis 4.  Nonobstructive LAD stenosis 5.  Mild segmental LV dysfunction with inferolateral hypokinesis and LVEF 50 to 55% by echo  assessment 6.  Normal LVEDP  Recommendations: Surgical evaluation for consideration of CABG.  The patient will be restarted on heparin after his sheath is out.  Case discussed with Dr. Tyrone Sage. Pt stable/chest pain free on heparin.     ASSESSMENT:    1. Coronary artery disease involving coronary bypass graft of native heart without angina pectoris   2. Blood loss anemia   3. Acute renal insufficiency   4. Essential hypertension   5. Hyperlipidemia, unspecified hyperlipidemia type   6. Controlled type 2 diabetes mellitus without complication, with long-term current use of insulin (HCC)      PLAN:  In order of problems listed above:  1. CAD status post CABG: Denies any further chest pain or shortness of breath.  His anginal symptom was more exertional dyspnea.  Compliant with aspirin and Plavix.  2. Blood loss anemia: Obtain CBC  3. AKI: Baseline creatinine was 1.2, it was 1.54 on recent blood work.  Will obtain repeat basic metabolic panel.  His only medication that can worsen his renal function is lisinopril.  We will continue lisinopril for the time being.  4. Hyperlipidemia: History of intolerance to Zocor, however also by patient report, he is intolerant to Lipitor and Crestor in the past as well.  Currently on a very low dose of Pravachol.  Will need to better control the LDL.  I will referred the patient to the lipid clinic for further management  5. DM2: On insulin, managed by primary care provider.  Recent hemoglobin A1c was 9.5, need better control.    Medication Adjustments/Labs and Tests Ordered: Current medicines are reviewed at length with the patient today.  Concerns regarding medicines are outlined above.  Medication changes, Labs and Tests ordered today are listed in the Patient Instructions below. Patient Instructions  Medication Instructions:  Your physician recommends that you continue on your current medications as directed. Please refer to the Current  Medication list given to you today.  If you need a refill on your cardiac medications before your next appointment, please call your pharmacy.  Labwork: Your physician recommends that you return for lab work in: TODAY-BMET, [employer] in our office  Testing/Procedures: NONE   Follow-Up: Your physician recommends that you schedule a follow-up appointment in: 2 MONTHS WITH DR Colin Keller  Any Other Special Instructions Will Be Listed Below (If Applicable).          Ramond Dial, [place]  09/11/2018 10:56 AM    [facility] [address] [place], [place], [place]  [address] Phone: [phone]; Fax: [phone]

## 2018-09-13 ENCOUNTER — Other Ambulatory Visit: Payer: Self-pay | Admitting: *Deleted

## 2018-09-13 DIAGNOSIS — R7989 Other specified abnormal findings of blood chemistry: Secondary | ICD-10-CM

## 2018-09-13 DIAGNOSIS — N289 Disorder of kidney and ureter, unspecified: Secondary | ICD-10-CM

## 2018-09-13 DIAGNOSIS — E875 Hyperkalemia: Secondary | ICD-10-CM

## 2018-09-13 DIAGNOSIS — D500 Iron deficiency anemia secondary to blood loss (chronic): Secondary | ICD-10-CM

## 2018-09-13 NOTE — Assessment & Plan Note (Signed)
CP status post CABG x3.  Stable, no ongoing chest pain since hospital discharge.  Anterior chest wall with well-healing midline incision.  Following up outpatient with cardiology.

## 2018-09-13 NOTE — Assessment & Plan Note (Signed)
Check BMET today 

## 2018-09-13 NOTE — Assessment & Plan Note (Addendum)
During encounter, wife adamant about refilling his pain meds at this time.   Husband refused wanting any and stated he would follow up with Dr. Artist Pais. Wife stated Dr. Artist Pais had given the OK to refill at this followup.  I advised them to call front office and have them leave her a message and PCP may refill electronically if felt appropriate.  They were later overheard arguing in the room as patient stated to his wife "if you hadn't been chomping on them pills like they were candy, you wouldn't have to ask for more."  Conversation confirmed by Dr. Pascal Lux on video precepting which patient and wife had agreed to at start of visit.  Concern she may be the one using his narcotics at home.  Would recommend close monitoring.

## 2018-09-13 NOTE — Assessment & Plan Note (Signed)
WIll hold off on repeating A1c today as last A1c 9.5 on 08/18/2018.  Continue on Tresiba and NovoLog, continue checking CBGs 3 times daily.  Commended him on his efforts to change his diet and start exercising.  Follow-up in 3 months.

## 2018-09-14 ENCOUNTER — Telehealth: Payer: Self-pay | Admitting: *Deleted

## 2018-09-14 DIAGNOSIS — E875 Hyperkalemia: Secondary | ICD-10-CM

## 2018-09-14 DIAGNOSIS — N289 Disorder of kidney and ureter, unspecified: Secondary | ICD-10-CM

## 2018-09-14 LAB — CBC
Hematocrit: 27.3 % — ABNORMAL LOW (ref 37.5–51.0)
Hemoglobin: 8.6 g/dL — ABNORMAL LOW (ref 13.0–17.7)
MCH: 30.4 pg (ref 26.6–33.0)
MCHC: 31.5 g/dL (ref 31.5–35.7)
MCV: 97 fL (ref 79–97)
Platelets: 536 10*3/uL — ABNORMAL HIGH (ref 150–450)
RBC: 2.83 x10E6/uL — ABNORMAL LOW (ref 4.14–5.80)
RDW: 13.7 % (ref 12.3–15.4)
WBC: 9 10*3/uL (ref 3.4–10.8)

## 2018-09-14 LAB — BASIC METABOLIC PANEL
BUN / CREAT RATIO: 26 — AB (ref 10–24)
BUN: 31 mg/dL — ABNORMAL HIGH (ref 8–27)
CHLORIDE: 101 mmol/L (ref 96–106)
CO2: 22 mmol/L (ref 20–29)
CREATININE: 1.21 mg/dL (ref 0.76–1.27)
Calcium: 9.7 mg/dL (ref 8.6–10.2)
GFR calc Af Amer: 73 mL/min/{1.73_m2} (ref >59)
GFR calc non Af Amer: 63 mL/min/{1.73_m2} (ref >59)
GLUCOSE: 216 mg/dL — AB (ref 65–99)
POTASSIUM: 5.8 mmol/L — AB (ref 3.5–5.2)
SODIUM: 138 mmol/L (ref 134–144)

## 2018-09-14 NOTE — Telephone Encounter (Signed)
Received call from [facility] for critical potassium of 5.8.  Per chart review: Patient had OV 10/1 with Azalee Course PA, BMET and CBC completed which revealed K 6.4 and Creatinine 1.6.   Per Wynema Birch, Lisinopril was stopped and BMET, CBC completed today.  Hmg remained at 8.6  Reviewed with Dr. Jens Som (DOD)-advised to hydrate, remain off lisinopril, avoid high potassium containing foods and repeat on Monday.      Spoke to patient who is aware and verbalized understanding.   Repeat order placed.

## 2018-09-14 NOTE — Telephone Encounter (Signed)
Agree with Dr. Ludwig Clarks recommendation, renal function and potassium improving. Recheck lab on Monday

## 2018-09-17 ENCOUNTER — Telehealth: Payer: Self-pay | Admitting: Cardiology

## 2018-09-17 DIAGNOSIS — E875 Hyperkalemia: Secondary | ICD-10-CM

## 2018-09-17 LAB — BASIC METABOLIC PANEL
BUN/Creatinine Ratio: 22 (ref 10–24)
BUN: 34 mg/dL — ABNORMAL HIGH (ref 8–27)
CO2: 22 mmol/L (ref 20–29)
Calcium: 9.5 mg/dL (ref 8.6–10.2)
Chloride: 98 mmol/L (ref 96–106)
Creatinine, Ser: 1.54 mg/dL — ABNORMAL HIGH (ref 0.76–1.27)
GFR, EST AFRICAN AMERICAN: 54 mL/min/{1.73_m2} — AB (ref >59)
GFR, EST NON AFRICAN AMERICAN: 47 mL/min/{1.73_m2} — AB (ref >59)
Glucose: 220 mg/dL — ABNORMAL HIGH (ref 65–99)
POTASSIUM: 6 mmol/L — AB (ref 3.5–5.2)
SODIUM: 135 mmol/L (ref 134–144)

## 2018-09-17 LAB — CBC
HEMATOCRIT: 28.5 % — AB (ref 37.5–51.0)
Hemoglobin: 9 g/dL — ABNORMAL LOW (ref 13.0–17.7)
MCH: 30.1 pg (ref 26.6–33.0)
MCHC: 31.6 g/dL (ref 31.5–35.7)
MCV: 95 fL (ref 79–97)
Platelets: 498 10*3/uL — ABNORMAL HIGH (ref 150–450)
RBC: 2.99 x10E6/uL — ABNORMAL LOW (ref 4.14–5.80)
RDW: 13.5 % (ref 12.3–15.4)
WBC: 8.6 10*3/uL (ref 3.4–10.8)

## 2018-09-17 NOTE — Telephone Encounter (Signed)
New Message:      [facility] reporting a critical lab   Potassium  6.0

## 2018-09-17 NOTE — Telephone Encounter (Signed)
Spoke with pt and informed that potassium level still remains high at 6. Level reviewed with Dr. Jens Som (DOD) and advised to make sure he is not taking any potassium supplements, drink plenty of water, and repeat BMP Wednesday morning. Pt also educated on low potassium diet. Pt verbalized understanding. Lab orders placed.

## 2018-09-18 ENCOUNTER — Telehealth: Payer: Self-pay | Admitting: Cardiology

## 2018-09-18 NOTE — Telephone Encounter (Signed)
New Message:    Patient wife calling for husband they would like to know how to get his potasium level down. Please call Pt

## 2018-09-18 NOTE — Telephone Encounter (Signed)
Spoke with pt who states him and his wife google foods last night that was low and potassium and wasn't able to find much. Pt informed that when he come into the office tomorrow, Nurse will provide a resource sheet with foods high, moderate and low in potassium. Pt also encouraged to continue to drink plenty of water. Pt verbalized understanding.

## 2018-09-19 ENCOUNTER — Telehealth: Payer: Self-pay

## 2018-09-19 LAB — BASIC METABOLIC PANEL
BUN / CREAT RATIO: 19 (ref 10–24)
BUN: 25 mg/dL (ref 8–27)
CO2: 21 mmol/L (ref 20–29)
CREATININE: 1.34 mg/dL — AB (ref 0.76–1.27)
Calcium: 8.9 mg/dL (ref 8.6–10.2)
Chloride: 100 mmol/L (ref 96–106)
GFR calc non Af Amer: 56 mL/min/{1.73_m2} — ABNORMAL LOW (ref >59)
GFR, EST AFRICAN AMERICAN: 64 mL/min/{1.73_m2} (ref >59)
Glucose: 312 mg/dL — ABNORMAL HIGH (ref 65–99)
Potassium: 5.4 mmol/L — ABNORMAL HIGH (ref 3.5–5.2)
SODIUM: 136 mmol/L (ref 134–144)

## 2018-09-19 NOTE — Telephone Encounter (Signed)
Opened in error

## 2018-09-20 ENCOUNTER — Telehealth: Payer: Self-pay | Admitting: Pharmacist

## 2018-09-20 ENCOUNTER — Ambulatory Visit (INDEPENDENT_AMBULATORY_CARE_PROVIDER_SITE_OTHER): Payer: Self-pay | Admitting: Pharmacist Clinician (PhC)/ Clinical Pharmacy Specialist

## 2018-09-20 DIAGNOSIS — E785 Hyperlipidemia, unspecified: Secondary | ICD-10-CM

## 2018-09-20 NOTE — Progress Notes (Signed)
HPI: Follow-up coronary artery disease.  Recently admitted to [facility] with chest pain and dyspnea and ruled in for non-ST elevation myocardial infarction.  Echocardiogram September 2019 showed ejection fraction 50 to 55% and mild diastolic dysfunction.  Cardiac catheterization September 2019 showed severe coronary disease and he ultimately had coronary artery bypass and graft with a LIMA to the LAD, RIMA to the right coronary artery and saphenous vein graft to the obtuse marginal.  Note preoperative carotid Dopplers showed 40 to 59% bilateral stenosis.  He has had problems with hyperkalemia postoperatively.  Also with chronic stage III kidney disease.  Since he was last seen patient denies dyspnea, chest pain, palpitations or syncope.  Current Outpatient Medications  Medication Sig Dispense Refill  . acetaminophen (TYLENOL) 500 MG tablet Take 2 tablets (1,000 mg total) by mouth every 6 (six) hours as needed for mild pain. 30 tablet 0  . aspirin 81 MG tablet Take 81 mg by mouth daily.    . clopidogrel (PLAVIX) 75 MG tablet Take 1 tablet (75 mg total) by mouth daily. 30 tablet 3  . ferrous sulfate 325 (65 FE) MG EC tablet Take 1 tablet (325 mg total) by mouth 2 (two) times daily. 60 tablet 1  . HYDROcodone-acetaminophen (NORCO) 10-325 MG tablet Take 1 tablet by mouth every 8 (eight) hours as needed. 90 tablet 0  . insulin aspart (NOVOLOG) 100 UNIT/ML injection Inject 4-8 Units into the skin 2 (two) times daily with a meal. 4-5 units BKFST, 4-7 units with dinner 10 mL 0  . insulin degludec (TRESIBA FLEXTOUCH) 100 UNIT/ML SOPN FlexTouch Pen Inject 0.14 mLs (14 Units total) into the skin every morning. 2 pen 0  . Menthol, Topical Analgesic, (BIOFREEZE ROLL-ON) 4 % GEL Apply 1 application topically as needed (shoulder pain).    . metoprolol tartrate (LOPRESSOR) 25 MG tablet Take 1 tablet (25 mg total) by mouth 2 (two) times daily. 60 tablet 3  . pravastatin (PRAVACHOL) 20 MG tablet Take  1 tablet (20 mg total) by mouth 3 (three) times a week. 30 tablet 2   No current facility-administered medications for this visit.      Past Medical History:  Diagnosis Date  . Diabetes mellitus   . GERD (gastroesophageal reflux disease)   . Hyperlipidemia   . Hypertension   . 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 Tobacco abuse   . Tobacco abuse disorder    Qualifier: Diagnosis of  By: Lafonda Mosses MD, Darl Pikes      Past Surgical History:  Procedure Laterality Date  . CORONARY ARTERY BYPASS GRAFT N/A 08/23/2018   Procedure: CORONARY ARTERY BYPASS GRAFTING (CABG) x 3; -Left Internal Mammary Artery to Left Anterior Descending Artery, -Right Internal Mammary Artery to Right Coronary Artery, -Saphenous Vein Graft to Obtuse Marginal;  ENDOSCOPIC HARVEST GREATER SAPHENOUS VEIN  -Right Thigh;  Surgeon: Purcell Nails, MD;  Location: [facility] OR;  Service: Open Heart Surgery;  Laterality: N/A;  . INTRAVASCULAR PRESSURE WIRE/FFR STUDY N/A 08/20/2018   Procedure: INTRAVASCULAR PRESSURE WIRE/FFR STUDY;  Surgeon: Tonny Bollman, MD;  Location: [facility] INVASIVE CV LAB;  Service: Cardiovascular;  Laterality: N/A;  . LEFT HEART CATH AND CORONARY ANGIOGRAPHY N/A 08/20/2018   Procedure: LEFT HEART CATH AND CORONARY ANGIOGRAPHY;  Surgeon: Tonny Bollman, MD;  Location: [facility] INVASIVE CV LAB;  Service: Cardiovascular;  Laterality: N/A;  . TEE WITHOUT CARDIOVERSION N/A 08/23/2018  Procedure: TRANSESOPHAGEAL ECHOCARDIOGRAM (TEE);  Surgeon: Purcell Nails, MD;  Location: [facility] OR;  Service: Open Heart Surgery;  Laterality: N/A;    Social History   Socioeconomic History  . Marital status: Married    Spouse name: Not on file  . Number of children: Not on file  . Years of education: Not on file  . Highest education level: Not on file  Occupational History  . Not on file  Social Needs  . Financial resource strain: Not on file  . Food insecurity:    Worry: Not on file     Inability: Not on file  . Transportation needs:    Medical: Not on file    Non-medical: Not on file  Tobacco Use  . Smoking status: Former Smoker    Packs/day: 0.50    Years: 45.00    Pack years: 22.50    Types: Cigarettes    Start date: 08/12/1970    Last attempt to quit: 08/12/2018    Years since quitting: 0.1  . Smokeless tobacco: Never Used  . Tobacco comment: Quit for 4.5 months in August 2016  Substance and Sexual Activity  . Alcohol use: No    Alcohol/week: 0.0 standard drinks  . Drug use: No  . Sexual activity: Yes  Lifestyle  . Physical activity:    Days per week: Not on file    Minutes per session: Not on file  . Stress: Not on file  Relationships  . Social connections:    Talks on phone: Not on file    Gets together: Not on file    Attends religious service: Not on file    Active member of club or organization: Not on file    Attends meetings of clubs or organizations: Not on file    Relationship status: Not on file  . Intimate partner violence:    Fear of current or ex partner: Not on file    Emotionally abused: Not on file    Physically abused: Not on file    Forced sexual activity: Not on file  Other Topics Concern  . 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 He and his wife were both laid off during recession. Wife Lawson Fiscal is also [facility] patient. Grown children. 1ppd smoker. Previously used marijuana -quit 10/03          Family History  Problem Relation Age of Onset  . Heart disease Mother   . Diabetes Mother   . Heart failure Mother   . Heart disease Father   . Sudden death Brother     ROS: no fevers or chills, productive cough, hemoptysis, dysphasia, odynophagia, melena, hematochezia, dysuria, hematuria, rash, seizure activity, orthopnea, PND, pedal edema, claudication. Remaining systems are negative.  Physical Exam: Well-developed well-nourished in no acute distress.  Skin is warm and dry.  HEENT is normal.  Neck is  supple.  Chest is clear to auscultation with normal expansion.  Cardiovascular exam is regular rate and rhythm.  Abdominal exam nontender or distended. No masses palpated. Extremities show no edema. neuro grossly intact   A/P  1 coronary artery disease status post coronary artery bypass graft-patient doing well postoperatively with no recurrent chest pain.  Continue medical therapy with aspirin and statin.  We will continue Plavix given non-ST elevation myocardial infarction.  We will discontinue in September 2020.  2 carotid artery disease-he will need follow-up carotid Dopplers September 2020.  3 hyperlipidemia-continue statin; repatha being initiated.  Check lipids  and liver in approximately 3 months.  4 hypertension-blood pressure is elevated.  However he has not taken his a.m. medications.  They will follow and we will adjust regimen as needed.  I would like to avoid ACE inhibitors or ARB's for now given recent hyperkalemia.  5 hyperkalemia-improving on most recent laboratories.  Will recheck in 3 months.  Continue low potassium diet.  6 tobacco abuse-patient has discontinued.  7 chronic stage III kidney disease-followed by primary care.  Olga Millers, MD

## 2018-09-20 NOTE — Telephone Encounter (Signed)
Patient called 2 days prior - requesting insulin (needs [place]).  Follow up call reveals he is having some erratic blood sugar control and appears to be adjusting his SS appropriately.   Medication Samples have been provided to the patient.  Drug name: Lovie Macadamia: 2  LOT:  [id number]   Exp.Date: 07/11/2020  The patient has been instructed regarding the correct time, dose, and frequency of taking this medication, including desired effects and most common side effects.   Madelon Lips 10:02 AM 09/20/2018

## 2018-09-20 NOTE — Progress Notes (Signed)
09/21/2018 Colin Keller [birth year] [id number]   HPI:  Colin Keller is a 62 y.o. male patient of Dr Jens Som, who presents today for a lipid clinic evaluation.  His medical history is significant for hyperlipidemia with NSTEMI last month, requiring CABG x 3.    In addition to this, he has hypertension, diabetes (uncontrolled with retinopathy and neuropathy), tobacco abuse and CKD (stage 3).  His follow up with Dr. Cornelius Moras is next week, but after that will be unable to afford cardiac rehab, as he has no health insurance.  He has been followed by Dr. Paulino Rily PharmD for his diabetes and is working to get better control.  He quit smoking at time of hospitalization.    He is here today to discuss cholesterol medication options.  He has been unable to tolerate statin drugs other than pravastatin, and that only 3 days per week. While this concerns him, he is equally concerned about his continued elevated potassium levels. They have been stressing over which foods are high in potassium.  We had a long discussion about potassium and what he should avoid in the short term until his level stabilizes.    Current Medications:  Pravastatin 20 mg three times weekly  Cholesterol Goals:   LDL < 70 (ideally a 50% drop which would be < 60)  Intolerant/previously tried:  Pravastatin 40 mg qd - myalgias  Simvastatin 40 mg qd - myalgias   Family history:   Father with heart failure and DM, mother with "heart disease"    Exercise:    Not cleared at this time, sees Dr. Cornelius Moras next week.  Uninsured and unable to afford cardiac rehab  Labs:   08/2018:  TC 198, TG 71, HDL 67, LDL 117  07/2016:  TC 188, TG 88, HDL 60, LDL 110  Current Outpatient Medications  Medication Sig Dispense Refill  . acetaminophen (TYLENOL) 500 MG tablet Take 2 tablets (1,000 mg total) by mouth every 6 (six) hours as needed for mild pain. 30 tablet 0  . aspirin 81 MG tablet Take 81 mg by mouth daily.    . clopidogrel (PLAVIX) 75 MG tablet  Take 1 tablet (75 mg total) by mouth daily. 30 tablet 3  . ferrous sulfate 325 (65 FE) MG EC tablet Take 1 tablet (325 mg total) by mouth 2 (two) times daily. 60 tablet 1  . HYDROcodone-acetaminophen (NORCO) 10-325 MG tablet Take 1 tablet by mouth every 8 (eight) hours as needed. 90 tablet 0  . insulin aspart (NOVOLOG) 100 UNIT/ML injection Inject 4-8 Units into the skin 2 (two) times daily with a meal. 4-5 units BKFST, 4-7 units with dinner 10 mL 0  . insulin degludec (TRESIBA FLEXTOUCH) 100 UNIT/ML SOPN FlexTouch Pen Inject 0.14 mLs (14 Units total) into the skin every morning. 2 pen 0  . Menthol, Topical Analgesic, (BIOFREEZE ROLL-ON) 4 % GEL Apply 1 application topically as needed (shoulder pain).    . metoprolol tartrate (LOPRESSOR) 25 MG tablet Take 1 tablet (25 mg total) by mouth 2 (two) times daily. 60 tablet 3  . pravastatin (PRAVACHOL) 20 MG tablet Take 1 tablet (20 mg total) by mouth 3 (three) times a week. 30 tablet 2  . ranitidine (ZANTAC) 75 MG tablet Take 75 mg by mouth 2 (two) times daily as needed for heartburn.    . traMADol (ULTRAM) 50 MG tablet Take 1 tablet (50 mg total) by mouth every 6 (six) hours as needed. 30 tablet 0   No current facility-administered  medications for this visit.     Allergies  Allergen Reactions  . Amlodipine     Leg swelling  . Amitriptyline Other (See Comments)    Urinary retention  . Carvedilol Diarrhea and Other (See Comments)    GI discomfort and hip pain  . Cyclobenzaprine Other (See Comments)    Made patient "restleness, twitchy"   . Nortriptyline Hcl Other (See Comments)    Vivid / bad dreams  . Simvastatin Other (See Comments)    Arthralgias    Past Medical History:  Diagnosis Date  . Diabetes mellitus   . GERD (gastroesophageal reflux disease)   . Hyperlipidemia   . Hypertension   . 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 Tobacco abuse   . Tobacco abuse disorder    Qualifier:  Diagnosis of  By: Lafonda Mosses MD, Darl Pikes      There were no vitals taken for this visit.   Hyperlipidemia Patient with hyperlipidemia and post NSTEMI and LDL of 117.  He continues with pravastatin 3 times weekly, but is unable to get dose any higher than that.  We will submit request to [employer] Safety [employer], as patient is currently uninsured.    He will continue to monitor potassium and was given some information from WebMD on foods that are low in potassium.     Phillips Hay PharmD CPP [facility]

## 2018-09-20 NOTE — Patient Instructions (Addendum)
Cholesterol labs:   goal  Total cholesterol  198   (<200)  Triglycerides  71     (<150)  HDL (good)  67      (> 40)  LDL (bad)  117    (<70)   In place of ranitidine (Zantac) try famotidine (Pepcid) 10-20 mg once to twice daily.  Try the WebMD web page, look up "potassium levels in food"  Evolocumab injection What is this medicine? EVOLOCUMAB (e voe LOK ue mab) is known as a PCSK9 inhibitor. It is used to lower the level of cholesterol in the blood. It may be used alone or in combination with other cholesterol-lowering drugs. This drug may also be used to reduce the risk of heart attack, stroke, and certain types of heart surgery in patients with heart disease. This medicine may be used for other purposes; ask your health care provider or pharmacist if you have questions. COMMON BRAND NAME(S): REPATHA What should I tell my health care provider before I take this medicine? They need to know if you have any of these conditions: -an unusual or allergic reaction to evolocumab, other medicines, foods, dyes, or preservatives -pregnant or trying to get pregnant -breast-feeding How should I use this medicine? This medicine is for injection under the skin. You will be taught how to prepare and give this medicine. Use exactly as directed. Take your medicine at regular intervals. Do not take your medicine more often than directed. It is important that you put your used needles and syringes in a special sharps container. Do not put them in a trash can. If you do not have a sharps container, call your pharmacist or health care provider to get one. Talk to your pediatrician regarding the use of this medicine in children. While this drug may be prescribed for children as young as 13 years for selected conditions, precautions do apply. Overdosage: If you think you have taken too much of this medicine contact a poison control center or emergency room at once. NOTE: This medicine is only for you. Do not  share this medicine with others. What if I miss a dose? If you miss a dose, take it as soon as you can if there are more than 7 days until the next scheduled dose, or skip the missed dose and take the next dose according to your original schedule. Do not take double or extra doses. What may interact with this medicine? Interactions are not expected. This list may not describe all possible interactions. Give your health care provider a list of all the medicines, herbs, non-prescription drugs, or dietary supplements you use. Also tell them if you smoke, drink alcohol, or use illegal drugs. Some items may interact with your medicine. What should I watch for while using this medicine? You may need blood work while you are taking this medicine. What side effects may I notice from receiving this medicine? Side effects that you should report to your doctor or health care professional as soon as possible: -allergic reactions like skin rash, itching or hives, swelling of the face, lips, or tongue -signs and symptoms of infection like fever or chills; cough; sore throat; pain or trouble passing urine Side effects that usually do not require medical attention (report to your doctor or health care professional if they continue or are bothersome): -diarrhea -nausea -muscle pain -pain, redness, or irritation at site where injected This list may not describe all possible side effects. Call your doctor for medical advice about side effects.  You may report side effects to FDA at 1-800-FDA-1088. Where should I keep my medicine? Keep out of the reach of children. You will be instructed on how to store this medicine. Throw away any unused medicine after the expiration date on the label. NOTE: This sheet is a summary. It may not cover all possible information. If you have questions about this medicine, talk to your doctor, pharmacist, or health care provider.  2018 Elsevier/Gold Standard (2016-11-14 13:21:53)

## 2018-09-21 ENCOUNTER — Encounter: Payer: Self-pay | Admitting: *Deleted

## 2018-09-21 ENCOUNTER — Ambulatory Visit (INDEPENDENT_AMBULATORY_CARE_PROVIDER_SITE_OTHER): Payer: Self-pay | Admitting: Cardiology

## 2018-09-21 ENCOUNTER — Encounter: Payer: Self-pay | Admitting: Cardiology

## 2018-09-21 ENCOUNTER — Encounter: Payer: Self-pay | Admitting: Pharmacist Clinician (PhC)/ Clinical Pharmacy Specialist

## 2018-09-21 VITALS — BP 170/80 | HR 73 | Ht 71.0 in | Wt 166.2 lb

## 2018-09-21 DIAGNOSIS — E785 Hyperlipidemia, unspecified: Secondary | ICD-10-CM

## 2018-09-21 DIAGNOSIS — I2510 Atherosclerotic heart disease of native coronary artery without angina pectoris: Secondary | ICD-10-CM

## 2018-09-21 DIAGNOSIS — I10 Essential (primary) hypertension: Secondary | ICD-10-CM

## 2018-09-21 DIAGNOSIS — I6523 Occlusion and stenosis of bilateral carotid arteries: Secondary | ICD-10-CM

## 2018-09-21 NOTE — Assessment & Plan Note (Signed)
Patient with hyperlipidemia and post NSTEMI and LDL of 117.  He continues with pravastatin 3 times weekly, but is unable to get dose any higher than that.  We will submit request to [employer] Safety [employer], as patient is currently uninsured.    He will continue to monitor potassium and was given some information from WebMD on foods that are low in potassium.

## 2018-09-21 NOTE — Patient Instructions (Signed)
Medication Instructions:  NO CHANGE If you need a refill on your cardiac medications before your next appointment, please call your pharmacy.   Lab work: Your physician recommends that you return for lab work in: 3 MONTHS PRIOR TO EATING  If you have labs (blood work) drawn today and your tests are completely normal, you will receive your results only by: Marland Kitchen MyChart Message (if you have MyChart) OR . A paper copy in the mail If you have any lab test that is abnormal or we need to change your treatment, we will call you to review the results.  Testing/Procedures: Your physician has requested that you have a carotid duplex. This test is an ultrasound of the carotid arteries in your neck. It looks at blood flow through these arteries that supply the brain with blood. Allow one hour for this exam. There are no restrictions or special instructions.  SCHEDULE IN September 2020  Follow-Up: At [facility], you and your health needs are our priority.  As part of our continuing mission to provide you with exceptional heart care, we have created designated Provider Care Teams.  These Care Teams include your primary Cardiologist (physician) and Advanced Practice Providers (APPs -  Physician Assistants and Nurse Practitioners) who all work together to provide you with the care you need, when you need it. You will need a follow up appointment in 6 months.  Please call our office 2 months in advance to schedule this appointment.  You may see Olga Millers, MD or one of the following Advanced Practice Providers on your designated Care Team:   Corine Shelter, PA-C Judy Pimple, [place] . Marjie Skiff, PA-C

## 2018-09-24 ENCOUNTER — Ambulatory Visit: Payer: Self-pay | Admitting: Thoracic Surgery (Cardiothoracic Vascular Surgery)

## 2018-09-25 ENCOUNTER — Other Ambulatory Visit: Payer: Self-pay | Admitting: Thoracic Surgery (Cardiothoracic Vascular Surgery)

## 2018-09-25 ENCOUNTER — Ambulatory Visit
Admission: RE | Admit: 2018-09-25 | Discharge: 2018-09-25 | Disposition: A | Discharge status: Home / Self Care | Payer: Self-pay | Source: Ambulatory Visit | Attending: Thoracic Surgery (Cardiothoracic Vascular Surgery) | Admitting: Thoracic Surgery (Cardiothoracic Vascular Surgery)

## 2018-09-25 ENCOUNTER — Ambulatory Visit (INDEPENDENT_AMBULATORY_CARE_PROVIDER_SITE_OTHER): Payer: Self-pay | Admitting: Physician Assistant

## 2018-09-25 VITALS — BP 150/84 | HR 70 | Resp 20 | Ht 71.0 in | Wt 165.0 lb

## 2018-09-25 DIAGNOSIS — I25119 Atherosclerotic heart disease of native coronary artery with unspecified angina pectoris: Secondary | ICD-10-CM

## 2018-09-25 DIAGNOSIS — I2510 Atherosclerotic heart disease of native coronary artery without angina pectoris: Secondary | ICD-10-CM

## 2018-09-25 DIAGNOSIS — Z951 Presence of aortocoronary bypass graft: Secondary | ICD-10-CM

## 2018-09-25 NOTE — Progress Notes (Signed)
HPI:  Patient returns for routine postoperative follow-up having undergone CABG x 3 on 08/23/2018. The patient's early postoperative recovery while in the hospital was notable for poorly controlled diabetes.  He was not able to be restarted on Metformin due to elevated renal function.  Since hospital discharge the patient reports that he is doing very well.  He states that he feels significantly better than he did before surgery.  He has refrained from smoking, but states being that he has not been able to be active has him thinking about cigarettes more frequently.  He is ambulating without difficulty.  He is anxious to get back to his regular activity.  He is not attending cardiac rehabilitation due to financial means.   Current Outpatient Medications  Medication Sig Dispense Refill  . acetaminophen (TYLENOL) 500 MG tablet Take 2 tablets (1,000 mg total) by mouth every 6 (six) hours as needed for mild pain. 30 tablet 0  . aspirin 81 MG tablet Take 81 mg by mouth daily.    . clopidogrel (PLAVIX) 75 MG tablet Take 1 tablet (75 mg total) by mouth daily. 30 tablet 3  . ferrous sulfate 325 (65 FE) MG EC tablet Take 1 tablet (325 mg total) by mouth 2 (two) times daily. 60 tablet 1  . HYDROcodone-acetaminophen (NORCO) 10-325 MG tablet Take 1 tablet by mouth every 8 (eight) hours as needed. 90 tablet 0  . insulin aspart (NOVOLOG) 100 UNIT/ML injection Inject 4-8 Units into the skin 2 (two) times daily with a meal. 4-5 units BKFST, 4-7 units with dinner 10 mL 0  . insulin degludec (TRESIBA FLEXTOUCH) 100 UNIT/ML SOPN FlexTouch Pen Inject 0.14 mLs (14 Units total) into the skin every morning. 2 pen 0  . Menthol, Topical Analgesic, (BIOFREEZE ROLL-ON) 4 % GEL Apply 1 application topically as needed (shoulder pain).    . metoprolol tartrate (LOPRESSOR) 25 MG tablet Take 1 tablet (25 mg total) by mouth 2 (two) times daily. 60 tablet 3  . pravastatin (PRAVACHOL) 20 MG tablet Take 1 tablet (20 mg total) by mouth 3  (three) times a week. 30 tablet 2   No current facility-administered medications for this visit.     Physical Exam:  BP (!) 150/84   Pulse 70   Resp 20   Ht 5\' 11"  (1.803 m)   Wt 165 lb (74.8 kg)   SpO2 98% Comment: RA  BMI 23.01 kg/m   Gen: no apparent distress Heart: RRR Lungs: CTA bilaterally Ext: no edema present Incisions: well healed  Diagnostic Tests:  CXR: resolution of previous pleural effusions, sternal wires intact  A/P:  1. S/P CABG x 3- doing very well, continue current medication regimen 2. Nicotine abuse- has not smoked since prior to surgery, encouraged patient to continue to remain smoke free, information provided for free smoking cessation 3. DM- patient admits sugars remain erratic, will continue to require close follow up with PCP 4. Activity- increase activity as tolerated, okay to start driving short distances.  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  He was advised that he should not hunt until 12 weeks from surgery. RTC in 3 months   Lowella Dandy, PA-C [facility] [phone]

## 2018-09-25 NOTE — Patient Instructions (Signed)
You may return to driving an automobile as long as you are no longer requiring oral narcotic pain relievers during the daytime.  It would be wise to start driving only short distances during the daylight and gradually increase from there as you feel comfortable.  Do not resume smoking cigarettes or any other tobacco products.  Make every effort to maintain a "heart-healthy" lifestyle with regular physical exercise and adherence to a low-fat, low-carbohydrate diet.  Continue to seek regular follow-up appointments with your primary care physician and/or cardiologist.   You may continue to gradually increase your physical activity as tolerated.  Refrain from any heavy lifting or strenuous use of your arms and shoulders until at least 8 weeks from the time of your surgery, and avoid activities that cause increased pain in your chest on the side of your surgical incision.  Otherwise you may continue to increase activities without any particular limitations.  Increase the intensity and duration of physical activity gradually.

## 2018-10-06 ENCOUNTER — Other Ambulatory Visit: Payer: Self-pay | Admitting: Family Medicine

## 2018-10-06 ENCOUNTER — Emergency Department (HOSPITAL_COMMUNITY): Payer: Self-pay

## 2018-10-06 ENCOUNTER — Inpatient Hospital Stay (HOSPITAL_COMMUNITY)
Admission: EM | Admit: 2018-10-06 | Discharge: 2018-10-08 | DRG: 291 | Disposition: A | Discharge status: Home / Self Care | Payer: Self-pay | Attending: Family Medicine | Admitting: Family Medicine

## 2018-10-06 ENCOUNTER — Encounter (HOSPITAL_COMMUNITY): Payer: Self-pay | Admitting: Emergency Medicine

## 2018-10-06 DIAGNOSIS — E1140 Type 2 diabetes mellitus with diabetic neuropathy, unspecified: Secondary | ICD-10-CM | POA: Diagnosis present

## 2018-10-06 DIAGNOSIS — I509 Heart failure, unspecified: Secondary | ICD-10-CM

## 2018-10-06 DIAGNOSIS — Z87891 Personal history of nicotine dependence: Secondary | ICD-10-CM

## 2018-10-06 DIAGNOSIS — G8929 Other chronic pain: Secondary | ICD-10-CM

## 2018-10-06 DIAGNOSIS — I5020 Unspecified systolic (congestive) heart failure: Secondary | ICD-10-CM

## 2018-10-06 DIAGNOSIS — E1122 Type 2 diabetes mellitus with diabetic chronic kidney disease: Secondary | ICD-10-CM | POA: Diagnosis present

## 2018-10-06 DIAGNOSIS — Z79899 Other long term (current) drug therapy: Secondary | ICD-10-CM

## 2018-10-06 DIAGNOSIS — Z8249 Family history of ischemic heart disease and other diseases of the circulatory system: Secondary | ICD-10-CM

## 2018-10-06 DIAGNOSIS — Z951 Presence of aortocoronary bypass graft: Secondary | ICD-10-CM

## 2018-10-06 DIAGNOSIS — M545 Low back pain, unspecified: Secondary | ICD-10-CM

## 2018-10-06 DIAGNOSIS — I130 Hypertensive heart and chronic kidney disease with heart failure and stage 1 through stage 4 chronic kidney disease, or unspecified chronic kidney disease: Principal | ICD-10-CM | POA: Diagnosis present

## 2018-10-06 DIAGNOSIS — N183 Chronic kidney disease, stage 3 (moderate): Secondary | ICD-10-CM | POA: Diagnosis present

## 2018-10-06 DIAGNOSIS — E785 Hyperlipidemia, unspecified: Secondary | ICD-10-CM | POA: Diagnosis present

## 2018-10-06 DIAGNOSIS — Z7982 Long term (current) use of aspirin: Secondary | ICD-10-CM

## 2018-10-06 DIAGNOSIS — I2510 Atherosclerotic heart disease of native coronary artery without angina pectoris: Secondary | ICD-10-CM | POA: Diagnosis present

## 2018-10-06 DIAGNOSIS — Z7902 Long term (current) use of antithrombotics/antiplatelets: Secondary | ICD-10-CM

## 2018-10-06 DIAGNOSIS — R0602 Shortness of breath: Secondary | ICD-10-CM | POA: Diagnosis present

## 2018-10-06 DIAGNOSIS — Z794 Long term (current) use of insulin: Secondary | ICD-10-CM

## 2018-10-06 DIAGNOSIS — K219 Gastro-esophageal reflux disease without esophagitis: Secondary | ICD-10-CM | POA: Diagnosis present

## 2018-10-06 DIAGNOSIS — I5042 Chronic combined systolic (congestive) and diastolic (congestive) heart failure: Secondary | ICD-10-CM

## 2018-10-06 DIAGNOSIS — I5022 Chronic systolic (congestive) heart failure: Secondary | ICD-10-CM

## 2018-10-06 DIAGNOSIS — I5033 Acute on chronic diastolic (congestive) heart failure: Secondary | ICD-10-CM | POA: Diagnosis present

## 2018-10-06 DIAGNOSIS — Z720 Tobacco use: Secondary | ICD-10-CM

## 2018-10-06 DIAGNOSIS — Z888 Allergy status to other drugs, medicaments and biological substances status: Secondary | ICD-10-CM

## 2018-10-06 DIAGNOSIS — E11319 Type 2 diabetes mellitus with unspecified diabetic retinopathy without macular edema: Secondary | ICD-10-CM | POA: Diagnosis present

## 2018-10-06 DIAGNOSIS — Z833 Family history of diabetes mellitus: Secondary | ICD-10-CM

## 2018-10-06 DIAGNOSIS — I5032 Chronic diastolic (congestive) heart failure: Secondary | ICD-10-CM

## 2018-10-06 DIAGNOSIS — D649 Anemia, unspecified: Secondary | ICD-10-CM | POA: Diagnosis present

## 2018-10-06 DIAGNOSIS — I252 Old myocardial infarction: Secondary | ICD-10-CM

## 2018-10-06 HISTORY — DX: Dyspnea, unspecified: R06.00

## 2018-10-06 HISTORY — DX: Atherosclerotic heart disease of native coronary artery without angina pectoris: I25.10

## 2018-10-06 HISTORY — DX: Heart failure, unspecified: I50.9

## 2018-10-06 HISTORY — DX: Chronic kidney disease, stage 3 unspecified: N18.30

## 2018-10-06 HISTORY — DX: Chronic kidney disease, stage 3 (moderate): N18.3

## 2018-10-06 LAB — CBC
HCT: 32.3 % — ABNORMAL LOW (ref 39.0–52.0)
Hemoglobin: 9.7 g/dL — ABNORMAL LOW (ref 13.0–17.0)
MCH: 29.7 pg (ref 26.0–34.0)
MCHC: 30 g/dL (ref 30.0–36.0)
MCV: 98.8 fL (ref 80.0–100.0)
PLATELETS: 320 10*3/uL (ref 150–400)
RBC: 3.27 MIL/uL — AB (ref 4.22–5.81)
RDW: 14 % (ref 11.5–15.5)
WBC: 8.4 10*3/uL (ref 4.0–10.5)
nRBC: 0 % (ref 0.0–0.2)

## 2018-10-06 LAB — BASIC METABOLIC PANEL
ANION GAP: 8 (ref 5–15)
BUN: 25 mg/dL — ABNORMAL HIGH (ref 8–23)
CALCIUM: 9.3 mg/dL (ref 8.9–10.3)
CHLORIDE: 105 mmol/L (ref 98–111)
CO2: 24 mmol/L (ref 22–32)
CREATININE: 1.29 mg/dL — AB (ref 0.61–1.24)
GFR calc Af Amer: >60 mL/min (ref >60)
GFR calc non Af Amer: 57 mL/min — ABNORMAL LOW (ref >60)
Glucose, Bld: 320 mg/dL — ABNORMAL HIGH (ref 70–99)
Potassium: 4 mmol/L (ref 3.5–5.1)
SODIUM: 137 mmol/L (ref 135–145)

## 2018-10-06 LAB — I-STAT TROPONIN, ED: TROPONIN I, POC: 0.04 ng/mL (ref 0.00–0.08)

## 2018-10-06 LAB — BRAIN NATRIURETIC PEPTIDE: B NATRIURETIC PEPTIDE 5: 1422.5 pg/mL — AB (ref 0.0–100.0)

## 2018-10-06 MED ORDER — FUROSEMIDE 10 MG/ML IJ SOLN
40.0000 mg | Freq: Once | INTRAMUSCULAR | Status: AC
  Administered 2018-10-06: 40 mg via INTRAVENOUS
  Filled 2018-10-06: qty 4

## 2018-10-06 NOTE — ED Triage Notes (Signed)
Pt presents to ER with concerns for bilat leg swelling, abd distension, increased sob over the last week 5 wks ago- open heart surgery Last week- cards released him to go back to work This past week he has been on feet more, doing more activity

## 2018-10-06 NOTE — ED Provider Notes (Signed)
[facility] EMERGENCY DEPARTMENT Provider Note   CSN: [id number] Arrival date & time: 10/06/18  1726     History   Chief Complaint Chief Complaint  Patient presents with  . Leg Swelling  . Shortness of Breath    HPI Colin Keller is a 62 y.o. male.  Colin Keller is a 62 y.o. Male with history of CABG 5 weeks ago, hypertension, hyperlipidemia, diabetes, GERD, tobacco use, who presents to the emergency department for evaluation of bilateral leg swelling abdominal distention and increasing shortness of breath over the last week.  Patient reports that he has had some mild swelling since his surgery in the right leg where they harvested the vein, but over the past week he has noticed worsening swelling in bilateral legs without redness warmth or tenderness.  He also reports noticing some swelling in his abdomen and over the past 2 days he has had worsening shortness of breath.  He reports when he lays down to try and go to sleep he feels like he cannot catch his breath and he has to sit up has only gotten 3-4 hours of sleep each night and this has been worsening still today.  No associated shortness of breath, lightheadedness or syncope.  He was on a fluid pill while he was in the hospital after his surgery but was not discharged on 1, they have been trying to preserve his kidney function.  Surgery was done by Dr. Cornelius Moras and he is followed by Dr. Jens Som with cardiology.     Past Medical History:  Diagnosis Date  . Diabetes mellitus   . GERD (gastroesophageal reflux disease)   . Hyperlipidemia   . Hypertension   . 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 Tobacco abuse   . Tobacco abuse disorder    Qualifier: Diagnosis of  By: Lafonda Mosses MD, Darl Pikes      Patient Active Problem List   Diagnosis Date Noted  . Shortness of breath 10/06/2018  . 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 Coronary artery disease involving native coronary  artery of native heart with unstable angina pectoris (HCC)   . Non-ST elevation (NSTEMI) myocardial infarction (HCC) 08/20/2018  . Acute diastolic heart failure (HCC) 08/18/2018  . Lower urinary tract symptoms (LUTS) 07/06/2018  . Encounter for chronic pain management 02/16/2015  . Diabetic neuropathy (HCC) 02/16/2015  . Chronic right shoulder pain 08/19/2014  . Diabetic retinopathy (HCC) 08/30/2013  . Insomnia due to medical condition 07/30/2013  . CKD (chronic kidney disease) stage 3, GFR 30-59 ml/min (HCC) 03/05/2013  . DM (diabetes mellitus), type 2, uncontrolled (HCC) 10/24/2012  . Colon cancer screening 10/24/2012  . AC joint dislocation 07/26/2012  . Frozen shoulder 06/13/2012  . Tobacco abuse disorder   . HYPERTENSION, BENIGN 12/22/2008  . Hyperlipidemia 12/21/2008  . BACK PAIN, LUMBAR 10/16/2008    Past Surgical History:  Procedure Laterality Date  . CORONARY ARTERY BYPASS GRAFT N/A 08/23/2018   Procedure: CORONARY ARTERY BYPASS GRAFTING (CABG) x 3; -Left Internal Mammary Artery to Left Anterior Descending Artery, -Right Internal Mammary Artery to Right Coronary Artery, -Saphenous Vein Graft to Obtuse Marginal;  ENDOSCOPIC HARVEST GREATER SAPHENOUS VEIN  -Right Thigh;  Surgeon: Purcell Nails, MD;  Location: [facility] OR;  Service: Open Heart Surgery;  Laterality: N/A;  . INTRAVASCULAR PRESSURE WIRE/FFR STUDY N/A 08/20/2018   Procedure: INTRAVASCULAR PRESSURE WIRE/FFR STUDY;  Surgeon: Tonny Bollman, MD;  Location: [facility] INVASIVE CV LAB;  Service: Cardiovascular;  Laterality: N/A;  . LEFT HEART CATH AND CORONARY ANGIOGRAPHY N/A 08/20/2018   Procedure: LEFT HEART CATH AND CORONARY ANGIOGRAPHY;  Surgeon: Tonny Bollman, MD;  Location: [facility] INVASIVE CV LAB;  Service: Cardiovascular;  Laterality: N/A;  . TEE WITHOUT CARDIOVERSION N/A 08/23/2018   Procedure: TRANSESOPHAGEAL ECHOCARDIOGRAM (TEE);  Surgeon: Purcell Nails, MD;  Location: [facility] OR;  Service: Open Heart Surgery;  Laterality: N/A;         Home Medications    Prior to Admission medications   Medication Sig Start Date End Date Taking? Authorizing Provider  acetaminophen (TYLENOL) 500 MG tablet Take 2 tablets (1,000 mg total) by mouth every 6 (six) hours as needed for mild pain. Patient taking differently: Take 1,000 mg by mouth at bedtime.  08/28/18  Yes Barrett, Erin R, PA-C  aspirin 81 MG tablet Take 81 mg by mouth daily.   Yes [provider]  clopidogrel (PLAVIX) 75 MG tablet Take 1 tablet (75 mg total) by mouth daily. 08/28/18  Yes Barrett, Erin R, PA-C  Famotidine 20 MG CHEW Chew 20 mg by mouth daily as needed (for reflux).   Yes [provider]  ferrous sulfate 325 (65 FE) MG EC tablet Take 1 tablet (325 mg total) by mouth 2 (two) times daily. 08/28/18 08/28/19 Yes Barrett, Rae Roam, PA-C  HYDROcodone-acetaminophen (NORCO) 10-325 MG tablet Take 1 tablet by mouth every 8 (eight) hours as needed. Patient taking differently: Take 1 tablet by mouth 3 (three) times daily.  09/06/18  Yes Jeneen Rinks J, DO  insulin aspart (NOVOLOG) 100 UNIT/ML injection Inject 4-8 Units into the skin 2 (two) times daily with a meal. 4-5 units BKFST, 4-7 units with dinner Patient taking differently: Inject 9-10 Units into the skin See admin instructions. Inject 9-10 units into the skin 2 times a day before meals- breakfast and dinner 09/03/18  Yes Hensel, Santiago Bumpers, MD  insulin degludec (TRESIBA FLEXTOUCH) 100 UNIT/ML SOPN FlexTouch Pen Inject 0.14 mLs (14 Units total) into the skin every morning. Patient taking differently: Inject 15-16 Units into the skin daily before breakfast.  09/03/18  Yes Hensel, Santiago Bumpers, MD  Menthol, Topical Analgesic, (BIOFREEZE ROLL-ON) 4 % GEL Apply 1 application topically as needed (shoulder pain).   Yes [provider]  metoprolol tartrate (LOPRESSOR) 25 MG tablet Take 1 tablet (25 mg total) by mouth 2 (two) times daily. 08/28/18  Yes Barrett, Erin R, PA-C  pravastatin (PRAVACHOL) 20 MG  tablet Take 1 tablet (20 mg total) by mouth 3 (three) times a week. Patient taking differently: Take 20 mg by mouth daily.  03/30/18  Yes Hensel, Santiago Bumpers, MD    Family History Family History  Problem Relation Age of Onset  . Heart disease Mother   . Diabetes Mother   . Heart failure Mother   . Heart disease Father   . Sudden death Brother     Social History Social History   Tobacco Use  . Smoking status: Former Smoker    Packs/day: 0.50    Years: 45.00    Pack years: 22.50    Types: Cigarettes    Start date: 08/12/1970    Last attempt to quit: 08/12/2018    Years since quitting: 0.1  . Smokeless tobacco: Never Used  . Tobacco comment: Quit for 4.5 months in August 2016  Substance Use Topics  . Alcohol use: No    Alcohol/week: 0.0 standard drinks  . Drug use: No  Allergies   Amlodipine; Amitriptyline; Carvedilol; Cyclobenzaprine; Nortriptyline hcl; and Simvastatin   Review of Systems Review of Systems  Constitutional: Negative for chills and fever.  HENT: Negative.   Eyes: Negative for visual disturbance.  Respiratory: Positive for shortness of breath. Negative for cough, chest tightness and wheezing.   Cardiovascular: Positive for leg swelling. Negative for chest pain and palpitations.  Gastrointestinal: Positive for abdominal distention. Negative for abdominal pain, diarrhea, nausea and vomiting.  Genitourinary: Negative for dysuria and frequency.  Musculoskeletal: Negative for arthralgias and myalgias.  Skin: Negative for color change and rash.  Neurological: Negative for dizziness, syncope, light-headedness and headaches.     Physical Exam Updated Vital Signs BP (!) 197/94 (BP Location: Left Arm)   Pulse 85   Resp 18   SpO2 98%   Physical Exam  Constitutional: He appears well-developed and well-nourished. He does not appear ill. No distress.  HENT:  Head: Normocephalic and atraumatic.  Mouth/Throat: Oropharynx is clear and moist.  Eyes: Right eye  exhibits no discharge. Left eye exhibits no discharge.  Neck: Normal range of motion. Neck supple. No JVD present. No tracheal deviation present.  Cardiovascular: Normal rate, regular rhythm, normal heart sounds and intact distal pulses. Exam reveals no gallop and no friction rub.  No murmur heard. Pulmonary/Chest: Effort normal. No respiratory distress.  Respirations equal and unlabored and patient able to speak in full sentences lungs with some crackles at bilateral bases but otherwise clear to auscultation. Sternotomy scar is well-healing  Abdominal: Soft. Bowel sounds are normal. He exhibits distension. There is no tenderness. There is no guarding.  Abdomen is soft with mild distention, bowel sounds present throughout, nontender to palpation in all quadrants  Musculoskeletal:       Right lower leg: He exhibits edema.       Left lower leg: He exhibits edema.  Bilateral lower extremities with 2+ pitting edema up to the level of the shin  Neurological: He is alert. Coordination normal.  Skin: Skin is warm and dry. Capillary refill takes less than 2 seconds. He is not diaphoretic.  Psychiatric: He has a normal mood and affect. His behavior is normal.  Nursing note and vitals reviewed.    ED Treatments / Results  Labs (all labs ordered are listed, but only abnormal results are displayed) Labs Reviewed  BASIC METABOLIC PANEL - Abnormal; Notable for the following components:      Result Value   Glucose, Bld 320 (*)    BUN 25 (*)    Creatinine, Ser 1.29 (*)    GFR calc non Af Amer 57 (*)    All other components within normal limits  CBC - Abnormal; Notable for the following components:   RBC 3.27 (*)    Hemoglobin 9.7 (*)    HCT 32.3 (*)    All other components within normal limits  BRAIN NATRIURETIC PEPTIDE - Abnormal; Notable for the following components:   B Natriuretic Peptide 1,422.5 (*)    All other components within normal limits  I-STAT TROPONIN, ED    EKG EKG  Interpretation  Date/Time:  Saturday October 06 2018 17:38:35 EDT Ventricular Rate:  78 PR Interval:  160 QRS Duration: 98 QT Interval:  392 QTC Calculation: 446 R Axis:   80 Text Interpretation:  Normal sinus rhythm Inferior infarct , age undetermined ST & T wave abnormality, consider lateral ischemia Abnormal ECG No significant change since last tracing Confirmed by Richardean Canal [phone]) on 10/06/2018 10:26:05 PM   Radiology Dg  Chest 2 View  Result Date: 10/06/2018 CLINICAL DATA:  Bilateral leg swelling. Shortness of breath for the past 2 days. Ex-smoker. Status post CABG 1 month ago. EXAM: CHEST - 2 VIEW COMPARISON:  09/25/2018. FINDINGS: Normal sized heart. Stable mild diffuse peribronchial thickening. Interval slight increase in prominence of the interstitial markings with minimal Kerley lines. The lungs remain mildly hyperexpanded. Interval small bilateral pleural effusions with minimal adjacent posterior atelectasis. Thoracic spine degenerative changes. Post CABG changes. IMPRESSION: Interval minimal changes of congestive heart failure superimposed on COPD and chronic bronchitic changes. Electronically Signed   By: Beckie Salts M.D.   On: 10/06/2018 18:37    Procedures Procedures (including critical care time)  Medications Ordered in ED Medications  furosemide (LASIX) injection 40 mg (40 mg Intravenous Given 10/06/18 2253)     Initial Impression / Assessment and Plan / ED Course  I have reviewed the triage vital signs and the nursing notes.  Pertinent labs & imaging results that were available during my care of the patient were reviewed by me and considered in my medical decision making (see chart for details).  Patient presents with lower extremity and abdominal swelling and shortness of breath, had CABG 5 weeks ago, no history of prior CHF and is not currently on any fluid pill.  Denies associated chest pain, was recently cleared by CT surgery to return to work.  He is  hypertensive but vitals are otherwise normal and patient appears to be in no acute distress.  Lungs with some crackles at both bases, mild abdominal distention without tenderness and bilateral lower extremity pitting edema.  Lab evaluation initiated from triage.  EKG without concerning ischemic changes.  Initial troponin is negative.  No leukocytosis, hemoglobin is stable when compared to prior, no acute electrolyte derangements aside from glucose of 320, creatinine is 1.29 which improved from patient's prior hospitalization.  BNP is significantly elevated at 1422.5.  Chest x-ray shows some increased interstitial edema suggestive of CHF superimposed on COPD.  Given recent CABG and significant fluid overload feel that patient will require admission, first dose of IV Lasix given here in the emergency department.  I have discussed with Dr. Liane Comber cardiology who will see the patient in consult and will admit to family medicine for further diuresis.  Final Clinical Impressions(s) / ED Diagnoses   Final diagnoses:  Acute on chronic congestive heart failure, unspecified heart failure type [facility])  Hx of CABG    ED Discharge Orders    None       Dartha Lodge, [place] 10/07/18 0144    Charlynne Pander, MD 10/09/18 1430

## 2018-10-06 NOTE — ED Notes (Signed)
Admitting at bedside 

## 2018-10-06 NOTE — H&P (Signed)
Family Medicine Teaching [facility] Admission History and Physical Service Pager: [phone]  Patient name: Colin Keller Medical record number: [id number] Date of birth: 1956-09-04 Age: 62 y.o. Gender: male  Primary Care Provider: [name], DO Consultants: cardiology  Code Status: Full   Chief Complaint: leg swelling + shortness of breath   Assessment and Plan: Colin Keller is a 62 y.o. male presenting with worsening shortness of breath and LE edema . PMH is significant for DM, GERD, HLD, HTN, CAD s/p CABG x3 (2019), Tobacco abuse  Leg edema + SOB Patient with worsening lower extremity edema and shortness of breath.  Likely secondary to acute CHF exacerbation.  BNP is on admission elevated to above 1400.  Creatinine elevated to 1.29, per chart review baseline appears to be about 1.4.  Chest x-ray on admission showing minimal changes of congestive heart failure superimposed on COPD and chronic bronchitic changes.  Dry weight between 162-163lbs. Echo from 08/19/18 showing EF 50-55% with G1DD. Patient with recent NSTEMI and CABG given likely etiology. Bilateral edema with no calf tenderness and well's score of 0 so unlikely DVT.  -admit to telemetry, attending Dr. Manson Passey -cardiology consulted in ED, appreciate recommendations  -daily weights -strict Is and Os -s/p IV lasix 40mg , will reassess for further lasix need in AM and per cardiology recs  -continuous cardiac monitoring  -AM EKG -repeat Echo  CAD s/p CABG x3 (2019) Per Cardiology, Dr. Jens Som, visit from 09/21/18 appears patient with recent echo from 9/19 showed ejection fraction 50-55% and mild diastolic dysfunction. Cardiac catheterization also in 9/19 showing severe coronary disease now s/p CABG with a LIMA to the LAD, RIMA to the right coronary artery and saphenous vein graft to the obtuse marginal. Was told to continue ASA, statin, and Plavix (with discontinuation planned for 08/2019). Troponin on admission negative. EKG  without significant change -continue plavix, ASA, statin -AM EKG  DM Uncontrolled. Home meds per chart review: Tresiba 14U daily, novolog 8U w meals. Reports novolog 9-10U daily with tresiba 15-16U, Last A1C from September elevated to 9.5.  -sSSI  HLD Home meds: pravastatin 20mg , repatha (initiated 10/11) -continue pravastatin -repatha not on home meds, will re-assess in am  HTN Hypertensive on admission. BP of 182/96. Patient reports he was very anxious of admission to hospital. Home meds: metoprolol 25mg  bid. Advised by cardiology to avoid ACE/ARBs due to h/o hyperkalemia.  -continue home meds.   H/o Hyperkalemia Normal on admission with K of 4.0.  -continue to monitor on daily BMP  CKD III Cr of 1.29. BL ~1.4 -continue to monitor on daily BMP  Anemia Hgb of 9.7 on admission, BL ~8. Home meds: iron supplementation  -continue iron supplementation -monitor on daily CBC  FEN/GI: heart healthy/carb modified  Prophylaxis: lovenox   Disposition: admit to telemetry, attending Dr. Manson Passey  History of Present Illness:  Colin Keller is a 62 y.o. male presenting with worsening SOB and LE edema.   Patient presenting today with worsening shortness of breath and edema.  Patient states that symptoms began 2 nights ago initially started with shortness of breath.  Shortness of breath began to worsen and his worst when trying to lay down.  States that he could not get a deep inhalation.  Patient has not been sleeping well for the past 2 nights because of this.  Reports one pillow orthopnea as well as PND.  Patient also reports increased lower extremity and abdominal edema.  States that he recently had heart surgery 5  weeks ago where they took a vein of his right leg and was told his right leg would continue to swell, however, he noticed that this morning his left leg began to swell as well.  Today's also his abdomen and to swell.  Patient's son was initially worried for pneumonia given his  worsening shortness of breath however patient has not had a cough.  Patient reports decreased water intake however normal urine output.  Patient is dry weight is 162-163 pounds.Not drinking a lot of water. Normal urine 3-4/day.  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  Wife and son both note that that he has increased physical activity recently.  Patient has taken all medications today except his evening medications.  In the ED cardiology was consulted.  Patient was given 40 mg IV Lasix.  BNP was elevated to above 1400.  Chest x-ray showing CHF.  Review Of Systems: Per HPI with the following additions:    Review of Systems  Constitutional: Negative for chills and fever.  Respiratory: Positive for shortness of breath.   Cardiovascular: Positive for orthopnea, leg swelling and PND. Negative for chest pain.  Gastrointestinal: Negative for abdominal pain, diarrhea, nausea and vomiting.  Genitourinary: Negative for dysuria, frequency and urgency.  Neurological: Negative for dizziness and headaches.    Patient Active Problem List   Diagnosis Date Noted  . Shortness of breath 10/06/2018  . 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 Coronary artery disease involving native coronary artery of native heart with unstable angina pectoris (HCC)   . Non-ST elevation (NSTEMI) myocardial infarction (HCC) 08/20/2018  . Acute diastolic heart failure (HCC) 08/18/2018  . Lower urinary tract symptoms (LUTS) 07/06/2018  . Encounter for chronic pain management 02/16/2015  . Diabetic neuropathy (HCC) 02/16/2015  . Chronic right shoulder pain 08/19/2014  . Diabetic retinopathy (HCC) 08/30/2013  . Insomnia due to medical condition 07/30/2013  . CKD (chronic kidney disease) stage 3, GFR 30-59 ml/min (HCC) 03/05/2013  . DM (diabetes mellitus), type 2, uncontrolled (HCC) 10/24/2012  . Colon cancer screening 10/24/2012  . AC joint dislocation 07/26/2012  . Frozen shoulder 06/13/2012  . Tobacco abuse disorder   .  HYPERTENSION, BENIGN 12/22/2008  . Hyperlipidemia 12/21/2008  . BACK PAIN, LUMBAR 10/16/2008    Past Medical History: Past Medical History:  Diagnosis Date  . Diabetes mellitus   . GERD (gastroesophageal reflux disease)   . Hyperlipidemia   . Hypertension   . 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 Tobacco abuse   . Tobacco abuse disorder    Qualifier: Diagnosis of  By: Lafonda Mosses MD, Darl Pikes      Past Surgical History: Past Surgical History:  Procedure Laterality Date  . CORONARY ARTERY BYPASS GRAFT N/A 08/23/2018   Procedure: CORONARY ARTERY BYPASS GRAFTING (CABG) x 3; -Left Internal Mammary Artery to Left Anterior Descending Artery, -Right Internal Mammary Artery to Right Coronary Artery, -Saphenous Vein Graft to Obtuse Marginal;  ENDOSCOPIC HARVEST GREATER SAPHENOUS VEIN  -Right Thigh;  Surgeon: Purcell Nails, MD;  Location: [facility] OR;  Service: Open Heart Surgery;  Laterality: N/A;  . INTRAVASCULAR PRESSURE WIRE/FFR STUDY N/A 08/20/2018   Procedure: INTRAVASCULAR PRESSURE WIRE/FFR STUDY;  Surgeon: Tonny Bollman, MD;  Location: [facility] INVASIVE CV LAB;  Service: Cardiovascular;  Laterality: N/A;  . LEFT HEART CATH AND CORONARY ANGIOGRAPHY N/A 08/20/2018   Procedure: LEFT HEART CATH AND CORONARY ANGIOGRAPHY;  Surgeon: Excell Seltzer,  Casimiro Needle, MD;  Location: [facility] INVASIVE CV LAB;  Service: Cardiovascular;  Laterality: N/A;  . TEE WITHOUT CARDIOVERSION N/A 08/23/2018   Procedure: TRANSESOPHAGEAL ECHOCARDIOGRAM (TEE);  Surgeon: Purcell Nails, MD;  Location: [facility] OR;  Service: Open Heart Surgery;  Laterality: N/A;    Social History: Social History   Tobacco Use  . Smoking status: Former Smoker    Packs/day: 0.50    Years: 45.00    Pack years: 22.50    Types: Cigarettes    Start date: 08/12/1970    Last attempt to quit: 08/12/2018    Years since quitting: 0.1  . Smokeless tobacco: Never Used  . Tobacco comment: Quit for 4.5 months in August 2016   Substance Use Topics  . Alcohol use: No    Alcohol/week: 0.0 standard drinks  . Drug use: No   Additional social history: lives with wife and son, quit smoking when admitted in September, occasional alcohol use (only drinks beer in the summer - last beer was in June/July), no drug use (used to smoke marijuana 15 years ago) Please also refer to relevant sections of EMR.  Family History: Family History  Problem Relation Age of Onset  . Heart disease Mother   . Diabetes Mother   . Heart failure Mother   . Heart disease Father   . Sudden death Brother     Allergies and Medications: Allergies  Allergen Reactions  . Amlodipine Swelling    Leg swelling  . Amitriptyline Other (See Comments)    Urinary retention  . Carvedilol Diarrhea and Other (See Comments)    GI discomfort and hip pain  . Cyclobenzaprine Other (See Comments)    Made patient "restless, twitchy"   . Nortriptyline Hcl Other (See Comments)    Vivid / bad dreams  . Simvastatin Other (See Comments)    Arthralgias   No current facility-administered medications on file prior to encounter.    Current Outpatient Medications on File Prior to Encounter  Medication Sig Dispense Refill  . acetaminophen (TYLENOL) 500 MG tablet Take 2 tablets (1,000 mg total) by mouth every 6 (six) hours as needed for mild pain. (Patient taking differently: Take 1,000 mg by mouth at bedtime. ) 30 tablet 0  . aspirin 81 MG tablet Take 81 mg by mouth daily.    . clopidogrel (PLAVIX) 75 MG tablet Take 1 tablet (75 mg total) by mouth daily. 30 tablet 3  . Famotidine 20 MG CHEW Chew 20 mg by mouth daily as needed (for reflux).    . ferrous sulfate 325 (65 FE) MG EC tablet Take 1 tablet (325 mg total) by mouth 2 (two) times daily. 60 tablet 1  . HYDROcodone-acetaminophen (NORCO) 10-325 MG tablet Take 1 tablet by mouth every 8 (eight) hours as needed. (Patient taking differently: Take 1 tablet by mouth 3 (three) times daily. ) 90 tablet 0  . insulin  aspart (NOVOLOG) 100 UNIT/ML injection Inject 4-8 Units into the skin 2 (two) times daily with a meal. 4-5 units BKFST, 4-7 units with dinner (Patient taking differently: Inject 9-10 Units into the skin See admin instructions. Inject 9-10 units into the skin 2 times a day before meals- breakfast and dinner) 10 mL 0  . insulin degludec (TRESIBA FLEXTOUCH) 100 UNIT/ML SOPN FlexTouch Pen Inject 0.14 mLs (14 Units total) into the skin every morning. (Patient taking differently: Inject 15-16 Units into the skin daily before breakfast. ) 2 pen 0  . Menthol, Topical Analgesic, (BIOFREEZE ROLL-ON) 4 % GEL Apply 1  application topically as needed (shoulder pain).    . metoprolol tartrate (LOPRESSOR) 25 MG tablet Take 1 tablet (25 mg total) by mouth 2 (two) times daily. 60 tablet 3  . pravastatin (PRAVACHOL) 20 MG tablet Take 1 tablet (20 mg total) by mouth 3 (three) times a week. (Patient taking differently: Take 20 mg by mouth daily. ) 30 tablet 2    Objective: BP (!) 178/86   Pulse 84   Resp 19   SpO2 98%  Exam: General: awake and alert, NAD, laying in bed Eyes: PERRL  ENTM: moist mucous membranes, oropharynx clear Neck: supple Cardiovascular: RRR, no MRG Respiratory: bibasilar crackles, speaking full sentences, no increased WOB Gastrointestinal: soft, non tender, distended, bowel sounds normal  MSK: 2+ pitting edema to knees bilaterally, 5/5 muscle strength bilaterally, normal grip strength  Derm: intact, no rashes  Neuro: no focal deficits  Psych: normal affect   Labs and Imaging: CBC BMET  Recent Labs  Lab 10/06/18 1804  WBC 8.4  HGB 9.7*  HCT 32.3*  PLT 320   Recent Labs  Lab 10/06/18 1804  NA 137  K 4.0  CL 105  CO2 24  BUN 25*  CREATININE 1.29*  GLUCOSE 320*  CALCIUM 9.3      Ref. Range 10/06/2018 18:04  B Natriuretic Peptide Latest Ref Range: 0.0 - 100.0 pg/mL 1,422.5 (H)    Ref. Range 10/06/2018 18:20  Troponin i, poc Latest Ref Range: 0.00 - 0.08 ng/mL 0.04   Dg  Chest 2 View  Result Date: 10/06/2018 CLINICAL DATA:  Bilateral leg swelling. Shortness of breath for the past 2 days. Ex-smoker. Status post CABG 1 month ago. EXAM: CHEST - 2 VIEW COMPARISON:  09/25/2018. FINDINGS: Normal sized heart. Stable mild diffuse peribronchial thickening. Interval slight increase in prominence of the interstitial markings with minimal Kerley lines. The lungs remain mildly hyperexpanded. Interval small bilateral pleural effusions with minimal adjacent posterior atelectasis. Thoracic spine degenerative changes. Post CABG changes. IMPRESSION: Interval minimal changes of congestive heart failure superimposed on COPD and chronic bronchitic changes. Electronically Signed   By: Beckie Salts M.D.   On: 10/06/2018 18:37   Dg Chest 2 View  Result Date: 09/25/2018 CLINICAL DATA:  Recent CABG.  Asymptomatic. EXAM: CHEST - 2 VIEW COMPARISON:  Chest x-ray dated August 27, 2018. FINDINGS: The heart size and mediastinal contours are within normal limits. Prior CABG. Normal pulmonary vascularity. Atherosclerotic calcification of the aortic arch. Previously seen small bilateral pleural effusions have resolved. No consolidation or pneumothorax. No acute osseous abnormality. IMPRESSION: No active cardiopulmonary disease. Resolved small bilateral pleural effusions. Electronically Signed   By: Obie Dredge M.D.   On: 09/25/2018 14:13     Oralia Manis, DO 10/07/2018, 1:29 AM PGY-2, [place] [facility] Family [facility] FPTS Intern pager: [phone], text pages welcome

## 2018-10-07 DIAGNOSIS — I5032 Chronic diastolic (congestive) heart failure: Secondary | ICD-10-CM

## 2018-10-07 DIAGNOSIS — R0602 Shortness of breath: Secondary | ICD-10-CM

## 2018-10-07 DIAGNOSIS — I5022 Chronic systolic (congestive) heart failure: Secondary | ICD-10-CM

## 2018-10-07 DIAGNOSIS — Z720 Tobacco use: Secondary | ICD-10-CM

## 2018-10-07 DIAGNOSIS — Z951 Presence of aortocoronary bypass graft: Secondary | ICD-10-CM

## 2018-10-07 DIAGNOSIS — I509 Heart failure, unspecified: Secondary | ICD-10-CM

## 2018-10-07 DIAGNOSIS — I5042 Chronic combined systolic (congestive) and diastolic (congestive) heart failure: Secondary | ICD-10-CM

## 2018-10-07 DIAGNOSIS — I5020 Unspecified systolic (congestive) heart failure: Secondary | ICD-10-CM

## 2018-10-07 LAB — BASIC METABOLIC PANEL
ANION GAP: 11 (ref 5–15)
BUN: 24 mg/dL — ABNORMAL HIGH (ref 8–23)
CO2: 28 mmol/L (ref 22–32)
Calcium: 9.5 mg/dL (ref 8.9–10.3)
Chloride: 98 mmol/L (ref 98–111)
Creatinine, Ser: 1.47 mg/dL — ABNORMAL HIGH (ref 0.61–1.24)
GFR calc Af Amer: 56 mL/min — ABNORMAL LOW (ref >60)
GFR, EST NON AFRICAN AMERICAN: 49 mL/min — AB (ref >60)
GLUCOSE: 508 mg/dL — AB (ref 70–99)
POTASSIUM: 4.5 mmol/L (ref 3.5–5.1)
Sodium: 137 mmol/L (ref 135–145)

## 2018-10-07 LAB — GLUCOSE, CAPILLARY
GLUCOSE-CAPILLARY: 358 mg/dL — AB (ref 70–99)
Glucose-Capillary: 427 mg/dL — ABNORMAL HIGH (ref 70–99)
Glucose-Capillary: 352 mg/dL — ABNORMAL HIGH (ref 70–99)

## 2018-10-07 LAB — CBG MONITORING, ED
Glucose-Capillary: 369 mg/dL — ABNORMAL HIGH (ref 70–99)
Glucose-Capillary: 221 mg/dL — ABNORMAL HIGH (ref 70–99)

## 2018-10-07 MED ORDER — ASPIRIN EC 81 MG PO TBEC
81.0000 mg | DELAYED_RELEASE_TABLET | Freq: Every day | ORAL | Status: DC
  Administered 2018-10-07 – 2018-10-08 (×2): 81 mg via ORAL
  Filled 2018-10-07 (×2): qty 1

## 2018-10-07 MED ORDER — HYDROCODONE-ACETAMINOPHEN 10-325 MG PO TABS
1.0000 | ORAL_TABLET | Freq: Three times a day (TID) | ORAL | Status: DC | PRN
  Administered 2018-10-07 – 2018-10-08 (×4): 1 via ORAL
  Filled 2018-10-07 (×4): qty 1

## 2018-10-07 MED ORDER — SODIUM CHLORIDE 0.9% FLUSH
3.0000 mL | Freq: Two times a day (BID) | INTRAVENOUS | Status: DC
  Administered 2018-10-07 – 2018-10-08 (×3): 3 mL via INTRAVENOUS

## 2018-10-07 MED ORDER — FAMOTIDINE 20 MG PO TABS: 20.0000 mg | ORAL_TABLET | Freq: Every day | ORAL | Status: DC | PRN

## 2018-10-07 MED ORDER — INSULIN ASPART 100 UNIT/ML ~~LOC~~ SOLN
0.0000 [IU] | Freq: Three times a day (TID) | SUBCUTANEOUS | Status: DC
  Administered 2018-10-08: 11 [IU] via SUBCUTANEOUS
  Administered 2018-10-08: 8 [IU] via SUBCUTANEOUS
  Administered 2018-10-08: 5 [IU] via SUBCUTANEOUS

## 2018-10-07 MED ORDER — TRAZODONE HCL 50 MG PO TABS
50.0000 mg | ORAL_TABLET | Freq: Every evening | ORAL | Status: DC | PRN
  Administered 2018-10-07: 50 mg via ORAL
  Filled 2018-10-07: qty 1

## 2018-10-07 MED ORDER — INSULIN ASPART 100 UNIT/ML ~~LOC~~ SOLN
6.0000 [IU] | Freq: Once | SUBCUTANEOUS | Status: AC
  Administered 2018-10-07: 6 [IU] via SUBCUTANEOUS

## 2018-10-07 MED ORDER — INSULIN ASPART 100 UNIT/ML ~~LOC~~ SOLN
0.0000 [IU] | Freq: Three times a day (TID) | SUBCUTANEOUS | Status: DC
  Administered 2018-10-07 (×3): 9 [IU] via SUBCUTANEOUS
  Administered 2018-10-07: 3 [IU] via SUBCUTANEOUS
  Filled 2018-10-07 (×2): qty 1

## 2018-10-07 MED ORDER — ONDANSETRON HCL 4 MG/2ML IJ SOLN: 4.0000 mg | Freq: Four times a day (QID) | INTRAMUSCULAR | Status: DC | PRN

## 2018-10-07 MED ORDER — METOPROLOL TARTRATE 50 MG PO TABS
50.0000 mg | ORAL_TABLET | Freq: Two times a day (BID) | ORAL | Status: DC
  Administered 2018-10-07 – 2018-10-08 (×2): 50 mg via ORAL
  Filled 2018-10-07 (×2): qty 1

## 2018-10-07 MED ORDER — FERROUS SULFATE 325 (65 FE) MG PO TABS
325.0000 mg | ORAL_TABLET | Freq: Two times a day (BID) | ORAL | Status: DC
  Administered 2018-10-07 – 2018-10-08 (×4): 325 mg via ORAL
  Filled 2018-10-07 (×5): qty 1

## 2018-10-07 MED ORDER — PRAVASTATIN SODIUM 20 MG PO TABS
20.0000 mg | ORAL_TABLET | Freq: Every day | ORAL | Status: DC
  Administered 2018-10-07 – 2018-10-08 (×2): 20 mg via ORAL
  Filled 2018-10-07 (×2): qty 1

## 2018-10-07 MED ORDER — SODIUM CHLORIDE 0.9 % IV SOLN: 250.0000 mL | INTRAVENOUS | Status: DC | PRN

## 2018-10-07 MED ORDER — CLOPIDOGREL BISULFATE 75 MG PO TABS
75.0000 mg | ORAL_TABLET | Freq: Every day | ORAL | Status: DC
  Administered 2018-10-07 – 2018-10-08 (×2): 75 mg via ORAL
  Filled 2018-10-07 (×2): qty 1

## 2018-10-07 MED ORDER — ENOXAPARIN SODIUM 40 MG/0.4ML ~~LOC~~ SOLN
40.0000 mg | Freq: Every day | SUBCUTANEOUS | Status: DC
  Administered 2018-10-07 – 2018-10-08 (×2): 40 mg via SUBCUTANEOUS
  Filled 2018-10-07 (×2): qty 0.4

## 2018-10-07 MED ORDER — METOPROLOL TARTRATE 25 MG PO TABS
25.0000 mg | ORAL_TABLET | Freq: Two times a day (BID) | ORAL | Status: DC
  Administered 2018-10-07: 25 mg via ORAL
  Filled 2018-10-07: qty 1

## 2018-10-07 MED ORDER — ACETAMINOPHEN 325 MG PO TABS
650.0000 mg | ORAL_TABLET | ORAL | Status: DC | PRN
  Administered 2018-10-07: 650 mg via ORAL
  Filled 2018-10-07: qty 2

## 2018-10-07 MED ORDER — SODIUM CHLORIDE 0.9% FLUSH: 3.0000 mL | INTRAVENOUS | Status: DC | PRN

## 2018-10-07 MED ORDER — HYDROCODONE-ACETAMINOPHEN 10-325 MG PO TABS: 1.0000 | ORAL_TABLET | Freq: Three times a day (TID) | ORAL | Status: DC | PRN

## 2018-10-07 NOTE — ED Notes (Signed)
Spoke with MD regarding patients daily pain medication.

## 2018-10-07 NOTE — ED Notes (Signed)
Pt currently sleeping, will reassess need for PRN pain medication when awake.

## 2018-10-07 NOTE — Progress Notes (Signed)
Family Medicine Teaching Service Daily Progress Note Intern Pager: [phone]  Patient name: Colin Keller Medical record number: [id number] Date of birth: 04/04/1956 Age: 62 y.o. Gender: male  Primary Care Provider: [name], DO Consultants: cardiology  Code Status: Full  Pt Overview and Major Events to Date:  Admitted to FPTS on 10/06/18  Assessment and Plan: Colin Keller is a 62 y.o. male presenting with worsening shortness of breath and LE edema . PMH is significant for DM, GERD, HLD, HTN, CAD s/p CABG x3 (2019), Tobacco abuse  Leg edema + SOB Improving. Current weight pending, with dry weight 162-163lb. Echo from 08/19/18 showing EF 50-55% with G1DD, repeat echo pending. Cardiology consulted prior to admission. Per cards recs, if patient showing poor diuresis can consdier postop complication such as constrictive pericarditis -cardiology consulted in ED, appreciate recommendations: f/u echo, continued IV diuresis with goal of net neg fluid balance of -1 to -2 L daily, likely dc with PO lasix, continue ASA/Plavix/metoprolol -daily weights -strict Is and Os -continuous cardiac monitoring  -AM EKG pending  -repeat Echo pending   CAD s/p CABG x3 (2019) Cardiac catheterization from 9/19 showing severe coronary disease now s/p CABG with a LIMA to the LAD, RIMA to the right coronary artery and saphenous vein graft to the obtuse marginal. Was told to continue ASA, statin, and Plavix (with discontinuation planned for 08/2019). EKG without significant change -continue plavix, ASA, statin -AM EKG pending  DM Uncontrolled. AM CBG showing pending. Home meds per chart review: Tresiba 14U daily, novolog 8U w meals. Reports novolog 9-10U daily with tresiba 15-16U, Last A1C from September elevated to 9.5.  -sSSI  HLD Home meds: pravastatin 20mg  tid, repatha (initiated 10/11) -continue pravastatin daily, unclear why tid dosing at home. Can consider initiating atorvastatin 80mg  daily or  rosuvastatin 40mg  daily  -repatha not on home meds, will re-assess in am  HTN Hypertensive to 180/93. Home meds: metoprolol 25mg  bid. Advised by cardiology to avoid ACE/ARBs due to h/o hyperkalemia.  -continue home meds.   H/o Hyperkalemia Normal on admission with K of 4.0.  -continue to monitor on daily BMP  CKD III Cr of 1.29. BL ~1.4 -continue to monitor on daily BMP  Anemia Hgb of 9.7 on admission, BL ~8. Home meds: iron supplementation  -continue iron supplementation -monitor on daily CBC  FEN/GI: heart healthy/carb modified  Prophylaxis: lovenox   Disposition: continued diuresis   Subjective:  Patient reports doing much better this morning. States he can feel the fluid is off his lungs.  Hoping to get some more rest today.  Objective: Pulse Rate:  [76-95] 95 (10/27 0730) Resp:  [6-21] 18 (10/27 0730) BP: (148-197)/(67-96) 180/93 (10/27 0730) SpO2:  [95 %-100 %] 95 % (10/27 0730) Physical Exam: General: awake and alert, laying in bed, NAD Cardiovascular: RRR, no MRG Respiratory: CTAB, no wheezes, rales, or rhonchi  Abdomen: soft, non tender, non distended, normal bowel sounds  Extremities: 1+ pitting edema to shin bilaterally  Laboratory: Recent Labs  Lab 10/06/18 1804  WBC 8.4  HGB 9.7*  HCT 32.3*  PLT 320   Recent Labs  Lab 10/06/18 1804  NA 137  K 4.0  CL 105  CO2 24  BUN 25*  CREATININE 1.29*  CALCIUM 9.3  GLUCOSE 320*    Imaging/Diagnostic Tests: Dg Chest 2 View  Result Date: 10/06/2018 CLINICAL DATA:  Bilateral leg swelling. Shortness of breath for the past 2 days. Ex-smoker. Status post CABG 1 month ago. EXAM: CHEST -  2 VIEW COMPARISON:  09/25/2018. FINDINGS: Normal sized heart. Stable mild diffuse peribronchial thickening. Interval slight increase in prominence of the interstitial markings with minimal Kerley lines. The lungs remain mildly hyperexpanded. Interval small bilateral pleural effusions with minimal adjacent posterior  atelectasis. Thoracic spine degenerative changes. Post CABG changes. IMPRESSION: Interval minimal changes of congestive heart failure superimposed on COPD and chronic bronchitic changes. Electronically Signed   By: Beckie Salts M.D.   On: 10/06/2018 18:37   Dg Chest 2 View  Result Date: 09/25/2018 CLINICAL DATA:  Recent CABG.  Asymptomatic. EXAM: CHEST - 2 VIEW COMPARISON:  Chest x-ray dated August 27, 2018. FINDINGS: The heart size and mediastinal contours are within normal limits. Prior CABG. Normal pulmonary vascularity. Atherosclerotic calcification of the aortic arch. Previously seen small bilateral pleural effusions have resolved. No consolidation or pneumothorax. No acute osseous abnormality. IMPRESSION: No active cardiopulmonary disease. Resolved small bilateral pleural effusions. Electronically Signed   By: Obie Dredge M.D.   On: 09/25/2018 14:13     Oralia Manis, DO 10/07/2018, 7:41 AM PGY-2, [place] [facility] FPTS Intern pager: [phone], text pages welcome

## 2018-10-07 NOTE — ED Notes (Signed)
MEAL GIVEN 

## 2018-10-07 NOTE — Progress Notes (Signed)
Cardiology consultation completed earlier this AM by overnight cardiology team. No additional recs at this time, additional recs pending echo results. Fairly significant uop with just a single IV lasix dose last night, consider repeat dosing pending AM labs results.    Dominga Ferry MD

## 2018-10-07 NOTE — Consult Note (Signed)
CARDIOLOGY CONSULT NOTE   Referring Physician: Dr. Manson Passey Primary Physician: Dr. Artist Pais Primary Cardiologist: Dr. Jens Som Reason for Consultation: Leg swelling  HPI: HAGEN BOHORQUEZ is a 62 y.o. male w/ diabetes, hypertension, hyperlipidemia, smoking, and CAD status post CABG 5 weeks ago who presents with lower extremity swelling.  Briefly, as noted above the patient underwent three-vessel CABG approximately 5 weeks ago after presenting with chest pain and elevated troponin.  His postoperative course was unremarkable and he was discharged home in good condition.  He notably has never had issues with volume retention and is never been on a diuretic in the outpatient setting.  He does note right lower extremity swelling shortly after surgery which has persisted since that time, however he was told that this was expected given that this was where his saphenous vein grafts were harvested from.  The patient now presents with 2 to 3 days of worsening bilateral lower extremity edema as well as orthopnea and PND.  He states that he is never had similar issues before.  He states that there is a strong positional component to his dyspnea and he can no longer lay flat in bed.  Interestingly, he reports that his symptoms improve when he stands up and walks around.  He denies any symptoms of chest pain, dizziness, lightheadedness, syncope, presyncope.  Review of Systems:     Cardiac Review of Systems: {Y] = yes [ ]  = no  Chest Pain [    ]  Resting SOB [Y] Exertional SOB  [ ]   Orthopnea [  ]   Pedal Edema [   ]    Palpitations [  ] Syncope  [  ]   Presyncope [   ]  General Review of Systems: [Y] = yes [  ]=no Constitional: recent weight change [  ]; anorexia [  ]; fatigue [  ]; nausea [  ]; night sweats [  ]; fever [  ]; or chills [  ];                                                                     Eyes : blurred vision [  ]; diplopia [   ]; vision changes [  ];  Amaurosis fugax[  ]; Resp: cough [  ];   wheezing[  ];  hemoptysis[  ];  PND [  ];  GI:  gallstones[  ], vomiting[  ];  dysphagia[  ]; melena[  ];  hematochezia [  ]; heartburn[  ];   GU: kidney stones [  ]; hematuria[  ];   dysuria [  ];  nocturia[  ]; incontinence [  ];             Skin: rash, swelling[  ];, hair loss[  ];  peripheral edema[  ];  or itching[  ]; Musculosketetal: myalgias[  ];  joint swelling[  ];  joint erythema[  ];  joint pain[  ];  back pain[  ];  Heme/Lymph: bruising[  ];  bleeding[  ];  anemia[  ];  Neuro: TIA[  ];  headaches[  ];  stroke[  ];  vertigo[  ];  seizures[  ];   paresthesias[  ];  difficulty walking[  ];  Psych:depression[  ];  anxiety[  ];  Endocrine: diabetes[  ];  thyroid dysfunction[  ];  Other: BLE edema  Past Medical History:  Diagnosis Date  . Diabetes mellitus   . GERD (gastroesophageal reflux disease)   . Hyperlipidemia   . Hypertension   . 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 Tobacco abuse   . Tobacco abuse disorder    Qualifier: Diagnosis of  By: Lafonda Mosses MD, Darl Pikes       (Not in a hospital admission)   Infusions:   Allergies  Allergen Reactions  . Amlodipine Swelling    Leg swelling  . Amitriptyline Other (See Comments)    Urinary retention  . Carvedilol Diarrhea and Other (See Comments)    GI discomfort and hip pain  . Cyclobenzaprine Other (See Comments)    Made patient "restless, twitchy"   . Nortriptyline Hcl Other (See Comments)    Vivid / bad dreams  . Simvastatin Other (See Comments)    Arthralgias    Social History   Socioeconomic History  . Marital status: Married    Spouse name: Not on file  . Number of children: Not on file  . Years of education: Not on file  . Highest education level: Not on file  Occupational History  . Not on file  Social Needs  . Financial resource strain: Not on file  . Food insecurity:    Worry: Not on file    Inability: Not on file  . Transportation needs:     Medical: Not on file    Non-medical: Not on file  Tobacco Use  . Smoking status: Former Smoker    Packs/day: 0.50    Years: 45.00    Pack years: 22.50    Types: Cigarettes    Start date: 08/12/1970    Last attempt to quit: 08/12/2018    Years since quitting: 0.1  . Smokeless tobacco: Never Used  . Tobacco comment: Quit for 4.5 months in August 2016  Substance and Sexual Activity  . Alcohol use: No    Alcohol/week: 0.0 standard drinks  . Drug use: No  . Sexual activity: Yes  Lifestyle  . Physical activity:    Days per week: Not on file    Minutes per session: Not on file  . Stress: Not on file  Relationships  . Social connections:    Talks on phone: Not on file    Gets together: Not on file    Attends religious service: Not on file    Active member of club or organization: Not on file    Attends meetings of clubs or organizations: Not on file    Relationship status: Not on file  . Intimate partner violence:    Fear of current or ex partner: Not on file    Emotionally abused: Not on file    Physically abused: Not on file    Forced sexual activity: Not on file  Other Topics Concern  . 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 He and his wife were both laid off during recession. Wife Lawson Fiscal is also [facility] patient. Grown children. 1ppd smoker. Previously used marijuana -quit 10/03          Family History  Problem Relation Age of Onset  . Heart disease Mother   . Diabetes Mother   . Heart failure Mother   . Heart disease Father   .  Sudden death Brother     PHYSICAL EXAM: Vitals:   10/06/18 2245 10/06/18 2300  BP:  (!) 178/86  Pulse: 80 84  Resp: (!) 21 19  SpO2: 98% 98%    No intake or output data in the 24 hours ending 10/07/18 0122  General:  Well appearing. No respiratory difficulty HEENT: normal Neck: supple. JVP elevated to ~12 cm H2O.  Cor: PMI nondisplaced. Regular rate & rhythm. No rubs, gallops or murmurs. Lungs: faint  bibasilar crackles Abdomen: soft, nontender, nondistended. No hepatosplenomegaly. No bruits or masses. Good bowel sounds. Extremities: no cyanosis, clubbing, rash; 2+ pitting edema to knee bilaterally and symmetric Neuro: alert & oriented x 3, cranial nerves grossly intact. moves all 4 extremities w/o difficulty. Affect pleasant.  ECG: NSR, nonspecific ST and T wave abnormalities  Results for orders placed or performed during the hospital encounter of 10/06/18 (from the past 24 hour(s))  Basic metabolic panel     Status: Abnormal   Collection Time: 10/06/18  6:04 PM  Result Value Ref Range   Sodium 137 135 - 145 mmol/L   Potassium 4.0 3.5 - 5.1 mmol/L   Chloride 105 98 - 111 mmol/L   CO2 24 22 - 32 mmol/L   Glucose, Bld 320 (H) 70 - 99 mg/dL   BUN 25 (H) 8 - 23 mg/dL   Creatinine, Ser 1.61 (H) 0.61 - 1.24 mg/dL   Calcium 9.3 8.9 - 09.6 mg/dL   GFR calc non Af Amer 57 (L) >60 mL/min   GFR calc Af Amer >60 >60 mL/min   Anion gap 8 5 - 15  CBC     Status: Abnormal   Collection Time: 10/06/18  6:04 PM  Result Value Ref Range   WBC 8.4 4.0 - 10.5 K/uL   RBC 3.27 (L) 4.22 - 5.81 MIL/uL   Hemoglobin 9.7 (L) 13.0 - 17.0 g/dL   HCT 04.5 (L) 40.9 - 81.1 %   MCV 98.8 80.0 - 100.0 fL   MCH 29.7 26.0 - 34.0 pg   MCHC 30.0 30.0 - 36.0 g/dL   RDW 91.4 78.2 - 95.6 %   Platelets 320 150 - 400 K/uL   nRBC 0.0 0.0 - 0.2 %  Brain natriuretic peptide     Status: Abnormal   Collection Time: 10/06/18  6:04 PM  Result Value Ref Range   B Natriuretic Peptide 1,422.5 (H) 0.0 - 100.0 pg/mL  I-stat troponin, ED     Status: None   Collection Time: 10/06/18  6:20 PM  Result Value Ref Range   Troponin i, poc 0.04 0.00 - 0.08 ng/mL   Comment 3           Dg Chest 2 View  Result Date: 10/06/2018 CLINICAL DATA:  Bilateral leg swelling. Shortness of breath for the past 2 days. Ex-smoker. Status post CABG 1 month ago. EXAM: CHEST - 2 VIEW COMPARISON:  09/25/2018. FINDINGS: Normal sized heart. Stable mild  diffuse peribronchial thickening. Interval slight increase in prominence of the interstitial markings with minimal Kerley lines. The lungs remain mildly hyperexpanded. Interval small bilateral pleural effusions with minimal adjacent posterior atelectasis. Thoracic spine degenerative changes. Post CABG changes. IMPRESSION: Interval minimal changes of congestive heart failure superimposed on COPD and chronic bronchitic changes. Electronically Signed   By: Beckie Salts M.D.   On: 10/06/2018 18:37   ASSESSMENT: SHERLEY LESER is a 62 y.o. male w/ diabetes, hypertension, hyperlipidemia, smoking, and CAD status post CABG 5 weeks ago who presents with lower  extremity swelling.  His clinical examination is consistent with volume overload likely in the setting of acute decompensated heart failure with preserved ejection fraction.  He will certainly benefit from IV diuresis and will likely need a low-dose oral diuretic to be taken in the outpatient setting to maintain euvolemia.  If a euvolemic fluid status proves difficult to achieve or maintain, it would be worth considering whether the patient's symptoms could be attributable to a postoperative complication such as constrictive pericarditis.  PLAN/DISCUSSION: - Agree with checking echocardiogram for evidence of newly reduced ejection fraction - Start diuresis with IV Lasix intermittent boluses to achieve a net negative fluid balance of -1 to -2 L daily - Patient will likely need a low-dose outpatient diuretic regimen on discharge - Recommend continuing other cardiac medications as prescribed, including aspirin, Plavix, metoprolol - Note patient is currently on an unusual dosing schedule of a moderate intensity statin; he has an indication for high intensity statin such as atorvastatin 80 mg daily or rosuvastatin 40 mg daily; the reason for his unusual dosing of a lower than recommended intensity statin should be explored and rectified of possible  Rosario Jacks, MD Cardiology Fellow, PGY-6

## 2018-10-07 NOTE — ED Notes (Signed)
Report given Greg RN.

## 2018-10-08 ENCOUNTER — Other Ambulatory Visit: Payer: Self-pay

## 2018-10-08 ENCOUNTER — Encounter (HOSPITAL_COMMUNITY): Payer: Self-pay | Admitting: General Practice

## 2018-10-08 ENCOUNTER — Telehealth: Payer: Self-pay | Admitting: Family Medicine

## 2018-10-08 ENCOUNTER — Inpatient Hospital Stay (HOSPITAL_COMMUNITY): Payer: Self-pay

## 2018-10-08 DIAGNOSIS — I5033 Acute on chronic diastolic (congestive) heart failure: Secondary | ICD-10-CM

## 2018-10-08 DIAGNOSIS — I5023 Acute on chronic systolic (congestive) heart failure: Secondary | ICD-10-CM

## 2018-10-08 DIAGNOSIS — I340 Nonrheumatic mitral (valve) insufficiency: Secondary | ICD-10-CM

## 2018-10-08 LAB — BASIC METABOLIC PANEL
Anion gap: 8 (ref 5–15)
BUN: 30 mg/dL — AB (ref 8–23)
CO2: 26 mmol/L (ref 22–32)
Calcium: 9.2 mg/dL (ref 8.9–10.3)
Chloride: 104 mmol/L (ref 98–111)
Creatinine, Ser: 1.4 mg/dL — ABNORMAL HIGH (ref 0.61–1.24)
GFR calc Af Amer: 60 mL/min — ABNORMAL LOW (ref >60)
GFR, EST NON AFRICAN AMERICAN: 52 mL/min — AB (ref >60)
GLUCOSE: 240 mg/dL — AB (ref 70–99)
POTASSIUM: 4.5 mmol/L (ref 3.5–5.1)
Sodium: 138 mmol/L (ref 135–145)

## 2018-10-08 LAB — CBC
HEMATOCRIT: 33 % — AB (ref 39.0–52.0)
HEMOGLOBIN: 10.3 g/dL — AB (ref 13.0–17.0)
MCH: 29.9 pg (ref 26.0–34.0)
MCHC: 31.2 g/dL (ref 30.0–36.0)
MCV: 95.7 fL (ref 80.0–100.0)
Platelets: 322 10*3/uL (ref 150–400)
RBC: 3.45 MIL/uL — ABNORMAL LOW (ref 4.22–5.81)
RDW: 14 % (ref 11.5–15.5)
WBC: 8 10*3/uL (ref 4.0–10.5)
nRBC: 0 % (ref 0.0–0.2)

## 2018-10-08 LAB — GLUCOSE, CAPILLARY
GLUCOSE-CAPILLARY: 219 mg/dL — AB (ref 70–99)
GLUCOSE-CAPILLARY: 30 mg/dL — AB (ref 70–99)
Glucose-Capillary: 175 mg/dL — ABNORMAL HIGH (ref 70–99)
Glucose-Capillary: 317 mg/dL — ABNORMAL HIGH (ref 70–99)
Glucose-Capillary: 263 mg/dL — ABNORMAL HIGH (ref 70–99)
Glucose-Capillary: 217 mg/dL — ABNORMAL HIGH (ref 70–99)

## 2018-10-08 LAB — ECHOCARDIOGRAM COMPLETE
Height: 71 in
Weight: 2489.6 oz

## 2018-10-08 MED ORDER — FUROSEMIDE 10 MG/ML IJ SOLN
40.0000 mg | Freq: Once | INTRAMUSCULAR | Status: AC
  Administered 2018-10-08: 40 mg via INTRAVENOUS
  Filled 2018-10-08: qty 4

## 2018-10-08 MED ORDER — INSULIN GLARGINE 100 UNIT/ML ~~LOC~~ SOLN: 10.0000 [IU] | Freq: Every day | SUBCUTANEOUS | Status: DC

## 2018-10-08 MED ORDER — SENNA 8.6 MG PO TABS: 2.0000 | ORAL_TABLET | Freq: Every day | ORAL | Status: DC

## 2018-10-08 MED ORDER — FUROSEMIDE 40 MG PO TABS: 40.0000 mg | ORAL_TABLET | Freq: Every day | ORAL | 0 refills | Status: DC

## 2018-10-08 MED ORDER — INSULIN GLARGINE 100 UNIT/ML ~~LOC~~ SOLN
14.0000 [IU] | Freq: Once | SUBCUTANEOUS | Status: AC
  Administered 2018-10-08: 14 [IU] via SUBCUTANEOUS
  Filled 2018-10-08: qty 0.14

## 2018-10-08 MED ORDER — METOPROLOL TARTRATE 50 MG PO TABS: 50.0000 mg | ORAL_TABLET | Freq: Two times a day (BID) | ORAL | 0 refills | Status: DC

## 2018-10-08 MED ORDER — PRAVASTATIN SODIUM 20 MG PO TABS: 20.0000 mg | ORAL_TABLET | Freq: Every day | ORAL | 0 refills | Status: DC

## 2018-10-08 MED ORDER — FUROSEMIDE 20 MG PO TABS: 20.0000 mg | ORAL_TABLET | Freq: Every day | ORAL | Status: DC

## 2018-10-08 MED ORDER — FUROSEMIDE 40 MG PO TABS: 40.0000 mg | ORAL_TABLET | Freq: Every day | ORAL | Status: DC

## 2018-10-08 NOTE — Progress Notes (Signed)
  Echocardiogram 2D Echocardiogram has been performed.  Gerda Diss 10/08/2018, 1:53 PM

## 2018-10-08 NOTE — Discharge Summary (Signed)
Family Medicine Teaching [facility] Discharge Summary  Patient name: Colin Keller Medical record number: [id number] Date of birth: [birth year] Age: 62 y.o. Gender: male Date of Admission: 10/06/2018  Date of Discharge: 10/08/2018 Admitting Physician: Westley Chandler, MD  Primary Care Provider: [name], DO Consultants: Cardiology  Indication for Hospitalization: Worsening Dyspnea and lower extremity edema  Discharge Diagnoses/Problem List:  Patient Active Problem List   Diagnosis Date Noted  . Acute on chronic congestive heart failure (HCC)   . Shortness of breath 10/06/2018  . Hx of CABG 08/23/2018  . Coronary artery disease involving native coronary artery of native heart with unstable angina pectoris (HCC)   . Diabetic neuropathy (HCC) 02/16/2015  . Chronic right shoulder pain 08/19/2014  . CKD (chronic kidney disease) stage 3, GFR 30-59 ml/min (HCC) 03/05/2013  . DM (diabetes mellitus), type 2, uncontrolled (HCC) 10/24/2012  . HYPERTENSION, BENIGN 12/22/2008  . Hyperlipidemia 12/21/2008   Disposition: Home  Discharge Condition: Stable  Discharge Exam:  Physical Exam:  Gen: NAD, alert, non-toxic, well-appearing, sitting comfortably in bed after a walk down the hall Skin: Warm and dry. No obvious rashes, lesions, or trauma. Scar along medial chest from CABG HEENT: NCAT.  MMM.  CV: RRR.  RP & DPs 2+ bilaterally. +1 pitting edema bilaterally to ankle (improved from yesterday) Resp: CTAB.  No wheezing, rales, abnormal lung sounds.  No increased WOB Abd: NTND on palpation to all 4 quadrants.  Positive bowel sounds. Psych: Cooperative with exam. Pleasant. Makes eye contact. Speech normal. Extremities: Moves all extremities spontaneously   Brief Hospital Course:  EDWORD CU is a 62 y.o. male with past medical history significant for poorly controlled T2DM, GERD, HLD, HTN, CAD s/p CABG x 3 (2019), and tobacco abuse, who presented with worsening dyspnea, weight gain,  orthopnea, PND, and lower extremity edema and found to have acute decompensated HFpEF.  Initial work up was significant for BNP 1400, CXR significant for small bilateral pleural effusions. Cardiology was consulted and patient was started on IV 40mg  Lasix resulting in  3L urine output and 4 pounds of weight loss. Patient had Echo performed which was significant for EF 50% with G1DD, which was unchanged from 08/19/18. Patient's shortness of breath improved significantly and was ambulating well on room air with minimal LE edema upon discharge. Patient was discharged on oral Lasix 40mg  QD with close follow-up with PCP and cardiology.  Issues for Follow Up:  1. Recommend daily weights every morning after urination. Aim for 1.5 L off per day, no moe than 3lb a day or 5 lb per week.  2. Begin Lasix 40mg  qd - please follow-up tolerance to this 3. Will need follow up with PCP in 1 week (Nov 5) for BMP to check Potassium and Creatinine 4. Will need follow up with cardiology Nov 15 5. Diabetes appears uncontrolled during admission, consider adjustments to current medical management  6. Patient to start Repatha per cardiologist. Recommend transition to high intensity statin due to extensive CV history if no improvement with current regimen  Significant Procedures: Echocardiogram  Significant Labs and Imaging:  Recent Labs  Lab 10/06/18 1804 10/08/18 0515  WBC 8.4 8.0  HGB 9.7* 10.3*  HCT 32.3* 33.0*  PLT 320 322   Recent Labs  Lab 10/06/18 1804 10/07/18 1902 10/08/18 0515  NA 137 137 138  K 4.0 4.5 4.5  CL 105 98 104  CO2 24 28 26   GLUCOSE 320* 508* 240*  BUN 25* 24* 30*  CREATININE 1.29* 1.47* 1.40*  CALCIUM 9.3 9.5 9.2   Echo (10/08/2018):  - Left ventricle: LVEF is approximately 50% with hypokinesis of the   lateral (base/mid) and basal inferior walls The cavity size was   normal. - Mitral valve: Calcified annulus. Mildly thickened leaflets .   There was mild regurgitation. - Left  atrium: The atrium was moderately dilated. - Right atrium: The atrium was mildly dilated.  Dg Chest 2 View  Result Date: 10/06/2018 CLINICAL DATA:  Bilateral leg swelling. Shortness of breath for the past 2 days. Ex-smoker. Status post CABG 1 month ago. EXAM: CHEST - 2 VIEW COMPARISON:  09/25/2018. FINDINGS: Normal sized heart. Stable mild diffuse peribronchial thickening. Interval slight increase in prominence of the interstitial markings with minimal Kerley lines. The lungs remain mildly hyperexpanded. Interval small bilateral pleural effusions with minimal adjacent posterior atelectasis. Thoracic spine degenerative changes. Post CABG changes. IMPRESSION: Interval minimal changes of congestive heart failure superimposed on COPD and chronic bronchitic changes. Electronically Signed   By: Beckie Salts M.D.   On: 10/06/2018 18:37   Dg Chest 2 View  Result Date: 09/25/2018 CLINICAL DATA:  Recent CABG.  Asymptomatic. EXAM: CHEST - 2 VIEW COMPARISON:  Chest x-ray dated August 27, 2018. FINDINGS: The heart size and mediastinal contours are within normal limits. Prior CABG. Normal pulmonary vascularity. Atherosclerotic calcification of the aortic arch. Previously seen small bilateral pleural effusions have resolved. No consolidation or pneumothorax. No acute osseous abnormality. IMPRESSION: No active cardiopulmonary disease. Resolved small bilateral pleural effusions. Electronically Signed   By: Obie Dredge M.D.   On: 09/25/2018 14:13   Results/Tests Pending at Time of Discharge: None  Discharge Medications:  Allergies as of 10/08/2018      Reactions   Amlodipine Swelling   Leg swelling   Amitriptyline Other (See Comments)   Urinary retention   Carvedilol Diarrhea, Other (See Comments)   GI discomfort and hip pain   Cyclobenzaprine Other (See Comments)   Made patient "restless, twitchy"    Nortriptyline Hcl Other (See Comments)   Vivid / bad dreams   Simvastatin Other (See Comments)    Arthralgias      Medication List    TAKE these medications   acetaminophen 500 MG tablet Commonly known as:  TYLENOL Take 2 tablets (1,000 mg total) by mouth every 6 (six) hours as needed for mild pain. What changed:  when to take this   aspirin 81 MG tablet Take 81 mg by mouth daily.   BIOFREEZE ROLL-ON 4 % Gel Generic drug:  Menthol (Topical Analgesic) Apply 1 application topically as needed (shoulder pain).   clopidogrel 75 MG tablet Commonly known as:  PLAVIX Take 1 tablet (75 mg total) by mouth daily.   Famotidine 20 MG Chew Chew 20 mg by mouth daily as needed (for reflux).   ferrous sulfate 325 (65 FE) MG EC tablet Take 1 tablet (325 mg total) by mouth 2 (two) times daily.   furosemide 40 MG tablet Commonly known as:  LASIX Take 1 tablet (40 mg total) by mouth daily. Start taking on:  10/09/2018   HYDROcodone-acetaminophen 10-325 MG tablet Commonly known as:  NORCO Take 1 tablet by mouth every 8 (eight) hours as needed. What changed:  when to take this   insulin aspart 100 UNIT/ML injection Commonly known as:  novoLOG Inject 4-8 Units into the skin 2 (two) times daily with a meal. 4-5 units BKFST, 4-7 units with dinner What changed:    how much to take  when to take this  additional instructions   insulin degludec 100 UNIT/ML Sopn FlexTouch Pen Commonly known as:  TRESIBA Inject 0.14 mLs (14 Units total) into the skin every morning. What changed:    how much to take  when to take this   metoprolol tartrate 50 MG tablet Commonly known as:  LOPRESSOR Take 1 tablet (50 mg total) by mouth 2 (two) times daily. What changed:    medication strength  how much to take   pravastatin 20 MG tablet Commonly known as:  PRAVACHOL Take 1 tablet (20 mg total) by mouth daily. Start taking on:  10/09/2018       Discharge Instructions: Please refer to Patient Instructions section of EMR for full details.  Patient was counseled important signs and symptoms  that should prompt return to medical care, changes in medications, dietary instructions, activity restrictions, and follow up appointments.   Follow-Up Appointments: Follow-up Information    Lewayne Bunting, MD. Go on 10/26/2018.   Specialty:  Cardiology Why:  Appointmnet is at 11:20 AM. Please arrive 15 minutes prior to your appointment. This will allow [place] to verify and update your medical record and ensure a full appointment for you within the time allotted. Contact information: [address] [place] [address] [phone]        [facility]. Go on 10/16/2018.   Why:  Appointment is at 11:00 AM. Please arrive 15 minutes early and bring medications. Contact information: [address] [place] [address] [phone]         Joana Reamer, DO 10/08/2018, 10:08 PM PGY-1, [facility]

## 2018-10-08 NOTE — Progress Notes (Signed)
Progress Note  Patient Name: Colin Keller Date of Encounter: 10/08/2018  Primary Cardiologist: Olga Millers, MD   Subjective   Ready to go home, feels well and swelling much improved  Inpatient Medications    Scheduled Meds: . aspirin EC  81 mg Oral Daily  . clopidogrel  75 mg Oral Daily  . enoxaparin (LOVENOX) injection  40 mg Subcutaneous Daily  . ferrous sulfate  325 mg Oral BID WC  . furosemide  20 mg Oral Daily  . insulin aspart  0-15 Units Subcutaneous TID WC  . insulin glargine  14 Units Subcutaneous Once  . metoprolol tartrate  50 mg Oral BID  . pravastatin  20 mg Oral Daily  . sodium chloride flush  3 mL Intravenous Q12H   Continuous Infusions: . sodium chloride     PRN Meds: sodium chloride, acetaminophen, famotidine, HYDROcodone-acetaminophen, ondansetron (ZOFRAN) IV, sodium chloride flush, traZODone   Vital Signs    Vitals:   10/07/18 1540 10/07/18 1547 10/07/18 2027 10/08/18 0427  BP: (!) 144/68  (!) 163/82 128/72  Pulse: 82  82 78  Resp:    (!) 21  Temp: 97.9 F (36.6 C)  98.4 F (36.9 C) 97.8 F (36.6 C)  TempSrc: Oral  Oral Oral  SpO2: 100%  99% 99%  Weight:  72.3 kg  70.6 kg  Height:  5\' 11"  (1.803 m)      Intake/Output Summary (Last 24 hours) at 10/08/2018 1131 Last data filed at 10/08/2018 0847 Gross per 24 hour  Intake -  Output 1810 ml  Net -1810 ml   Filed Weights   10/07/18 1547 10/08/18 0427  Weight: 72.3 kg 70.6 kg    Telemetry    NSR - Personally Reviewed  ECG    No new today - Personally Reviewed  Physical Exam   GEN: No acute distress.   Neck: No JVD Cardiac: RRR, no murmurs, rubs, or gallops.  Chest: Sternotomy well healing Respiratory: Clear to auscultation bilaterally. GI: Soft, nontender, non-distended  MS: No edema; No deformity. Neuro:  Nonfocal  Psych: Normal affect   Labs    Chemistry Recent Labs  Lab 10/06/18 1804 10/07/18 1902 10/08/18 0515  NA 137 137 138  K 4.0 4.5 4.5  CL 105 98  104  CO2 24 28 26   GLUCOSE 320* 508* 240*  BUN 25* 24* 30*  CREATININE 1.29* 1.47* 1.40*  CALCIUM 9.3 9.5 9.2  GFRNONAA 57* 49* 52*  GFRAA >60 56* 60*  ANIONGAP 8 11 8      Hematology Recent Labs  Lab 10/06/18 1804 10/08/18 0515  WBC 8.4 8.0  RBC 3.27* 3.45*  HGB 9.7* 10.3*  HCT 32.3* 33.0*  MCV 98.8 95.7  MCH 29.7 29.9  MCHC 30.0 31.2  RDW 14.0 14.0  PLT 320 322    Cardiac EnzymesNo results for input(s): TROPONINI in the last 168 hours.  Recent Labs  Lab 10/06/18 1820  TROPIPOC 0.04     BNP Recent Labs  Lab 10/06/18 1804  BNP 1,422.5*     DDimer No results for input(s): DDIMER in the last 168 hours.   Radiology    Dg Chest 2 View  Result Date: 10/06/2018 CLINICAL DATA:  Bilateral leg swelling. Shortness of breath for the past 2 days. Ex-smoker. Status post CABG 1 month ago. EXAM: CHEST - 2 VIEW COMPARISON:  09/25/2018. FINDINGS: Normal sized heart. Stable mild diffuse peribronchial thickening. Interval slight increase in prominence of the interstitial markings with minimal Kerley lines. The lungs  remain mildly hyperexpanded. Interval small bilateral pleural effusions with minimal adjacent posterior atelectasis. Thoracic spine degenerative changes. Post CABG changes. IMPRESSION: Interval minimal changes of congestive heart failure superimposed on COPD and chronic bronchitic changes. Electronically Signed   By: Beckie Salts M.D.   On: 10/06/2018 18:37    Cardiac Studies  Echo 10/08/18 Study Conclusions  - Left ventricle: LVEF is approximately 50% with hypokinesis of the   lateral (base/mid) and basal inferior walls The cavity size was   normal. - Mitral valve: Calcified annulus. Mildly thickened leaflets .   There was mild regurgitation. - Left atrium: The atrium was moderately dilated. - Right atrium: The atrium was mildly dilated.   Assessment & Plan    Principal Problem:   Acute on chronic congestive heart failure (HCC) Active Problems:   Tobacco  abuse   Hx of CABG   Shortness of breath  Acute on chronic diastolic heart failure - He has diuresed well and feels he is back to his baseline, and is ready to go home. His echocardiogram is largely similar in regionality to his echocardiogram from just prior to CABG. We will dismiss home on a low dose diuretic to be continued until follow up with cardiology at which time on going therapy can be decided. We had a lengthy discussion on dietary modifications including reducing salt intake, fast food, and walking to maintain his stamina.  No further cardiovascular testing required inpatient.  [facility] will sign off.   Medication Recommendations:  Lasix 40mg  daily until follow up with Dr. Olga Millers Other recommendations (labs, testing, etc): BMET in 1 week for potassium and creatinine. Follow up as an outpatient:  PCP in 1 week from hospital dismissal Cardiology Nov 15 with Dr. Jens Som (already scheduled)  For questions or updates, please contact [facility] Please consult [email or web address] for contact info under        Signed, Parke Poisson, MD  10/08/2018, 11:31 AM

## 2018-10-08 NOTE — Progress Notes (Signed)
Pt belongings gathered spouse at bedside and has bag of items. Telemetry D/C'd, IV removed. D/C instructions reviewed with patient and spouse, both verbalized understanding. Will transport pt to lobby via wc for D/C with wife.

## 2018-10-08 NOTE — Progress Notes (Signed)
Inpatient Diabetes Program Recommendations  AACE/ADA: New Consensus Statement on Inpatient Glycemic Control (2015)  Target Ranges:  Prepandial:   less than 140 mg/dL      Peak postprandial:   less than 180 mg/dL (1-2 hours)      Critically ill patients:  140 - 180 mg/dL   Lab Results  Component Value Date   GLUCAP 317 (H) 10/08/2018   HGBA1C 9.5 (H) 08/18/2018    Review of Glycemic Control Results for Colin Keller, Colin Keller (MRN [id number]) as of 10/08/2018 11:18  Ref. Range 10/07/2018 07:46 10/07/2018 11:13 10/07/2018 15:55 10/07/2018 20:07 10/07/2018 20:59 10/08/2018 00:03 10/08/2018 00:08 10/08/2018 04:25 10/08/2018 05:28 10/08/2018 07:38  Glucose-Capillary Latest Ref Range: 70 - 99 mg/dL 782 (H) 956 (H) 213 (H) 427 (H) 352 (H) 28 (LL) 30 (LL) 175 (H) 219 (H) 317 (H)   Diabetes history: DM 2 Outpatient Diabetes medications:  Tresiba 14 units daily, Novolog 4-5 units with breakfast and 4-7 units with supper Current orders for Inpatient glycemic control:  Novolog moderate tid with meals Lantus 10 units q HS (to start tonight)  Inpatient Diabetes Program Recommendations:   Note erratic blood sugar control. I am familiar with patient from past admit.  He is sensitive to insulin administration and omission.  He definitely need both basal insulin and meal coverage.  He received a total of 24 units of Novolog insulin in 3 hour period and then dropped to 30 mg/dL.  Recommendations:  -Reduce Novolog correction to sensitive tid with meals and HS.  -Start Lantus to 12 units Now and daily.   -Add Novolog meal coverage 3 units tid with meals.    Thanks,  Beryl Meager, RN, BC-ADM Inpatient Diabetes Coordinator Pager [phone] (8a-5p)

## 2018-10-08 NOTE — Telephone Encounter (Signed)
Will forward to MD and covering provider. Yotam Rhine,CMA  

## 2018-10-08 NOTE — Plan of Care (Signed)
Pt denies SOB or CP this shift; CBG >400 @ Hs, covered with 9 units, rechecked, and noted to still be elevated. Coverage administered for new value as per on call provider's order, and pt called an hour or so later to state that he felt like his sugar was low. Pt CBG rechecked, noted to be 28. Pt given crackers and peanut butter & juice, on call provider notified.

## 2018-10-08 NOTE — Discharge Instructions (Signed)
Take 1 tablet of the 40 mg Lasix daily. Follow up with the attached appointments.

## 2018-10-08 NOTE — Telephone Encounter (Signed)
Pt wife is calling to check on the status of his Norco being refilled. He is being discharged from the ED today and would like to be able to pick it up. Please call when this has been sent in.

## 2018-10-08 NOTE — Progress Notes (Signed)
Family Medicine Teaching Service Daily Progress Note Intern Pager: [phone]  Patient name: Colin Keller Medical record number: [id number] Date of birth: 03-21-1956 Age: 62 y.o. Gender: male  Primary Care Provider: [name], DO Consultants: cardiology  Code Status: Full  Pt Overview and Major Events to Date:  Admitted to FPTS on 10/06/18  Assessment and Plan: Colin Keller is a 62 y.o. male presenting with worsening shortness of breath and LE edema . PMH is significant for DM, GERD, HLD, HTN, CAD s/p CABG x3 (2019), Tobacco abuse  Worsening Edema/SOB likely 2/2 acute decompensated HFpEF: Improving. Patient given 1 dose 40mg  IV lasix in ED. Wt: 72.3kg>70.6kg (~4 pounds down). Dry weight 73kg. Echo from 08/19/18 showing EF 50-55% with G1DD, repeat echo pending. Cardiology consulted prior to admission. UOP overnight 3L. Denies chest pain. Ambulating down hall on RA without SOB . Creatinine down trending, electrolytes stable. Patient appears euvolemic on exam. Pending exam, will plan for discharge on PO lasix.  -cardiology consulted in ED, appreciate recommendations:  - Repeat echo pending - Continue ASA/Plavix/metoprolol -daily weights -strict Is and Os -continuous cardiac monitoring  - Plan to start Lasix 20mg  QD on discharge  CAD s/p CABG x3 (2019) Cardiac catheterization from 9/19 showing severe coronary disease now s/p CABG with a LIMA to the LAD, RIMA to the right coronary artery and saphenous vein graft to the obtuse marginal. Was told to continue ASA, statin, and Plavix (with discontinuation planned for 08/2019). Repeat EKG NSR and unchanged from day prior -continue plavix, ASA, statin  DM Uncontrolled. Home meds per chart review: Tresiba 14U daily, novolog 8U w meals. Reports novolog 9-10U daily with tresiba 15-16U, Last A1C from September elevated to 9.5. Elevated BS ON to 350, given 9U Novolog and then another 6U, leading to BS of 30. Improved with a snack. AM CBG 317. Will  add Lantus 14U today and continue SSI. Plan to discharge patient on home meds with recommended follow-up with PCP for better management of DM -sSSI - Begin Lantus 14U today - Monitor CBGs - Recommend follow-up with PCP for adjustments to current DM treatment  HLD Home meds: pravastatin 20mg  tid. Was to begin Repatha per cardiologist but had not picked up rx yet. Has history of myalgia with Simvastatin. Due to CV history, considering adding high intensity statin atorvastatin 80mg  daily or rosuvastatin 40mg  daily.  -continue pravastatin daily - Begin atorvastatin  - Begin Repatha at discharge and follow-up with cardiologist  HTN BP stable this AM 128/72. Home meds: metoprolol 25mg  bid. Advised by cardiology to avoid ACE/ARBs due to h/o hyperkalemia.  -continue home meds.  - Continue to monitor  H/o Hyperkalemia Potassium stable at 4.5.  -continue to monitor on daily BMP  CKD III Cr of 1.29>1.47>1.40. (BL ~1.4) Improving. Will continue to monitor as patient is diuresed.  -continue to monitor on daily BMP  Anemia Hgb of 9.7 on admission, 10.3 today. BL ~8. Home meds: iron supplementation  -continue iron supplementation -monitor on daily CBC  FEN/GI: heart healthy/carb modified  Prophylaxis: lovenox   Disposition: continued diuresis   Subjective:  Patient reports doing much better this morning. Denies any CP or SOB when walking down the hall. He feels "100% better".  Objective: Temp:  [97.8 F (36.6 C)-98.4 F (36.9 C)] 98.1 F (36.7 C) (10/28 1636) Pulse Rate:  [66-82] 66 (10/28 1636) Resp:  [21] 21 (10/28 0427) BP: (128-163)/(72-82) 136/77 (10/28 1636) SpO2:  [98 %-99 %] 98 % (10/28 1636) Weight:  [70.6  kg] 70.6 kg (10/28 0427) Physical Exam:  Gen: NAD, alert, non-toxic, well-appearing, sitting comfortably in bed after a walk down the hall Skin: Warm and dry. No obvious rashes, lesions, or trauma. Scar along medial chest from CABG HEENT: NCAT.  MMM.  CV: RRR.  RP  & DPs 2+ bilaterally. +1 pitting edema bilaterally to ankle (improved from yesterday) Resp: CTAB.  No wheezing, rales, abnormal lung sounds.  No increased WOB Abd: NTND on palpation to all 4 quadrants.  Positive bowel sounds. Psych: Cooperative with exam. Pleasant. Makes eye contact. Speech normal. Extremities: Moves all extremities spontaneously   Laboratory: Recent Labs  Lab 10/06/18 1804 10/08/18 0515  WBC 8.4 8.0  HGB 9.7* 10.3*  HCT 32.3* 33.0*  PLT 320 322   Recent Labs  Lab 10/06/18 1804 10/07/18 1902 10/08/18 0515  NA 137 137 138  K 4.0 4.5 4.5  CL 105 98 104  CO2 24 28 26   BUN 25* 24* 30*  CREATININE 1.29* 1.47* 1.40*  CALCIUM 9.3 9.5 9.2  GLUCOSE 320* 508* 240*    Imaging/Diagnostic Tests: Dg Chest 2 View  Result Date: 10/06/2018 CLINICAL DATA:  Bilateral leg swelling. Shortness of breath for the past 2 days. Ex-smoker. Status post CABG 1 month ago. EXAM: CHEST - 2 VIEW COMPARISON:  09/25/2018. FINDINGS: Normal sized heart. Stable mild diffuse peribronchial thickening. Interval slight increase in prominence of the interstitial markings with minimal Kerley lines. The lungs remain mildly hyperexpanded. Interval small bilateral pleural effusions with minimal adjacent posterior atelectasis. Thoracic spine degenerative changes. Post CABG changes. IMPRESSION: Interval minimal changes of congestive heart failure superimposed on COPD and chronic bronchitic changes. Electronically Signed   By: Beckie Salts M.D.   On: 10/06/2018 18:37   Dg Chest 2 View  Result Date: 09/25/2018 CLINICAL DATA:  Recent CABG.  Asymptomatic. EXAM: CHEST - 2 VIEW COMPARISON:  Chest x-ray dated August 27, 2018. FINDINGS: The heart size and mediastinal contours are within normal limits. Prior CABG. Normal pulmonary vascularity. Atherosclerotic calcification of the aortic arch. Previously seen small bilateral pleural effusions have resolved. No consolidation or pneumothorax. No acute osseous  abnormality. IMPRESSION: No active cardiopulmonary disease. Resolved small bilateral pleural effusions. Electronically Signed   By: Obie Dredge M.D.   On: 09/25/2018 14:13    Joana Reamer, DO 10/08/2018, 5:27 PM PGY-1, [facility] Family Medicine FPTS Intern pager: [phone], text pages welcome

## 2018-10-09 ENCOUNTER — Telehealth: Payer: Self-pay | Admitting: Pharmacist

## 2018-10-09 DIAGNOSIS — Z794 Long term (current) use of insulin: Secondary | ICD-10-CM

## 2018-10-09 DIAGNOSIS — E1142 Type 2 diabetes mellitus with diabetic polyneuropathy: Secondary | ICD-10-CM

## 2018-10-09 DIAGNOSIS — IMO0002 Reserved for concepts with insufficient information to code with codable children: Secondary | ICD-10-CM

## 2018-10-09 DIAGNOSIS — E1165 Type 2 diabetes mellitus with hyperglycemia: Secondary | ICD-10-CM

## 2018-10-09 LAB — GLUCOSE, CAPILLARY: Glucose-Capillary: 28 mg/dL — CL (ref 70–99)

## 2018-10-09 MED ORDER — INSULIN ASPART 100 UNIT/ML ~~LOC~~ SOLN: 10.0000 [IU] | Freq: Two times a day (BID) | SUBCUTANEOUS | 0 refills | Status: DC

## 2018-10-09 MED ORDER — INSULIN DEGLUDEC 100 UNIT/ML ~~LOC~~ SOPN: 14.0000 [IU] | PEN_INJECTOR | SUBCUTANEOUS | 0 refills | Status: DC

## 2018-10-09 NOTE — Telephone Encounter (Signed)
Patient reports recent hospitalization for volume overload post CABG.   Reports he went home yesterday.   He expressed concern over higher readings of blood sugar recently.     Discussed plan to continue same dose Tresiba 14 or 16 or 18 based on fasting readings.  Also planned to increase Novolog from 8 at max dose to 10 or 12 with meals for next few days.   F/U phone call from him requested at the end of the week. I plan to adjust regimen again (hopefully downward) at that time.   Consideration for SGLT-2 therapy in combination with insulin in the near future.  Has visit in early November with Dr. Homero Fellers

## 2018-10-09 NOTE — Telephone Encounter (Signed)
Norco refilled.  She should be receiving a call from pharmacy indicating this is ready

## 2018-10-09 NOTE — Telephone Encounter (Signed)
Pt wife called to let Dr. Artist Pais know that he was able to leave the hospital late last night but was still not able to pick up his Norco. I informed her that it can take 24-48 hours. PT wife would like for someone to call her when this has been sent to the pharmacy.

## 2018-10-09 NOTE — Telephone Encounter (Signed)
Sure I can do it for now, but refer to my last clinic note as to why I'm wary of the situation.

## 2018-10-16 ENCOUNTER — Encounter: Payer: Self-pay | Admitting: Family Medicine

## 2018-10-16 ENCOUNTER — Ambulatory Visit (INDEPENDENT_AMBULATORY_CARE_PROVIDER_SITE_OTHER): Payer: Self-pay | Admitting: Family Medicine

## 2018-10-16 VITALS — BP 142/80 | HR 75 | Temp 98.2°F | Wt 168.0 lb

## 2018-10-16 DIAGNOSIS — I5033 Acute on chronic diastolic (congestive) heart failure: Secondary | ICD-10-CM

## 2018-10-16 MED ORDER — FUROSEMIDE 40 MG PO TABS: 40.0000 mg | ORAL_TABLET | Freq: Every day | ORAL | 0 refills | Status: DC

## 2018-10-16 NOTE — Progress Notes (Signed)
    Subjective:  Colin Keller is a 62 y.o. male who presents to the [facility] today for his hospital follow up.  HPI: [name] is being seen for hospital follow-up for heart failure exacerbation.  Roughly 6 weeks ago, he had a triple coronary artery bypass graft.  He was admitted to the hospital on 10/26 with a heart failure exacerbation.  During his time in the hospital, he was given IV Lasix and experiencee a total of 4 pound weight loss.  He was discharged on Lasix 40 mg daily.  His Lisinopril was discontinued on admission due to concern for AKI.  He appears to be doing well today.  He has no complaints of trouble breathing or orthopnea or orthostatic hypotension.  His lower extremity edema is significantly improved though still present, slightly more on the right than the left (due to graft being taken from right leg).  Since his hospital discharge, he has worked up to walking as much as 1.5 miles per day and tries to remain vigilant about leaving salt out of his diet.   Objective:  Physical Exam: BP (!) 142/80 (BP Location: Right Arm, Patient Position: Sitting, Cuff Size: Normal)   Pulse 75   Temp 98.2 F (36.8 C) (Oral)   Wt 168 lb (76.2 kg)   SpO2 100%   BMI 23.43 kg/m   Gen: NAD, resting comfortably, he was in excellent spirits and pleasant to talk to. CV: RRR with no murmurs appreciated Pulm: Normal respiratory effort on room air, CTAB with no crackles, wheezes, or rhonchi Extremities: 1+ pitting edema on the right leg, trace edema on the left leg  No results found for this or any previous visit (from the past 72 hour(s)).   Assessment/Plan:  Acute on chronic congestive heart failure [facility]) Colin Keller appears to be doing much better following his recent hospitalization.  Will check his potassium level and renal function today and consider restarting his lisinopril with the results of those labs. His blood pressure is mildly elevated this visit and would tolerate the addition of the  ACEi. -Continue Lasix 40 mg daily -Consider restarting lisinopril when BMP results  Follow-up in 1 month to address diabetes management.  Colin Keller was offered the Pneumovax today but declined due to expense.  He is currently financially struggling to pay off his large hospital bill from his CABG and is working with Gavin Pound to find what help is possible.

## 2018-10-16 NOTE — Progress Notes (Signed)
HPI: Follow-up coronary artery disease.  Recently admitted to [facility] with chest pain and dyspnea and ruled in for non-ST elevation myocardial infarction.  Echocardiogram September 2019 showed ejection fraction 50 to 55% and mild diastolic dysfunction.  Cardiac catheterization September 2019 showed severe coronary disease and he ultimately had coronary artery bypass and graft with a LIMA to the LAD, RIMA to the right coronary artery and saphenous vein graft to the obtuse marginal.  Note preoperative carotid Dopplers showed 40 to 59% bilateral stenosis.  He has had problems with hyperkalemia postoperatively.  Also with chronic stage III kidney disease.  Admitted with CHF 10/19; echo showed EF 50, mild MR and biatrial enlargement. Pt treated with diuretics with improvement in symptoms. Since he was last seen his dyspnea has resolved.  He denies chest pain, palpitations or syncope.  No pedal edema.  Current Outpatient Medications  Medication Sig Dispense Refill  . acetaminophen (TYLENOL) 500 MG tablet Take 2 tablets (1,000 mg total) by mouth every 6 (six) hours as needed for mild pain. (Patient taking differently: Take 1,000 mg by mouth at bedtime. ) 30 tablet 0  . aspirin 81 MG tablet Take 81 mg by mouth daily.    . clopidogrel (PLAVIX) 75 MG tablet Take 1 tablet (75 mg total) by mouth daily. 30 tablet 3  . Famotidine 20 MG CHEW Chew 20 mg by mouth daily as needed (for reflux).    . furosemide (LASIX) 40 MG tablet Take 1 tablet (40 mg total) by mouth daily. 30 tablet 0  . HYDROcodone-acetaminophen (NORCO) 10-325 MG tablet TAKE 1 TABLET BY MOUTH EVEYR 8 HOURS AS NEEDED 90 tablet 0  . insulin aspart (NOVOLOG) 100 UNIT/ML injection Inject 10-12 Units into the skin 2 (two) times daily with a meal. 4-5 units BKFST, 4-7 units with dinner 10 mL 0  . insulin degludec (TRESIBA FLEXTOUCH) 100 UNIT/ML SOPN FlexTouch Pen Inject 0.14-0.18 mLs (14-18 Units total) into the skin every morning. 2 pen 0   . Menthol, Topical Analgesic, (BIOFREEZE ROLL-ON) 4 % GEL Apply 1 application topically as needed (shoulder pain).    . metoprolol tartrate (LOPRESSOR) 50 MG tablet Take 1 tablet (50 mg total) by mouth 2 (two) times daily. 30 tablet 0  . pravastatin (PRAVACHOL) 20 MG tablet Take 1 tablet (20 mg total) by mouth daily. 30 tablet 0  . ferrous sulfate 325 (65 FE) MG EC tablet Take 1 tablet (325 mg total) by mouth 2 (two) times daily. (Patient not taking: Reported on 10/26/2018) 60 tablet 1   No current facility-administered medications for this visit.      Past Medical History:  Diagnosis Date  . CHF (congestive heart failure) (HCC)   . CKD (chronic kidney disease), stage III (HCC)   . Coronary artery disease   . Diabetes mellitus   . Dyspnea   . GERD (gastroesophageal reflux disease)   . Hyperlipidemia   . Hypertension   . 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 Tobacco abuse   . Tobacco abuse disorder    Qualifier: Diagnosis of  By: Lafonda Mosses MD, Darl Pikes      Past Surgical History:  Procedure Laterality Date  . CORONARY ARTERY BYPASS GRAFT N/A 08/23/2018   Procedure: CORONARY ARTERY BYPASS GRAFTING (CABG) x 3; -Left Internal Mammary Artery to Left Anterior Descending Artery, -Right Internal Mammary Artery to Right Coronary Artery, -Saphenous Vein Graft to  Obtuse Marginal;  ENDOSCOPIC HARVEST GREATER SAPHENOUS VEIN  -Right Thigh;  Surgeon: Purcell Nails, MD;  Location: [facility] OR;  Service: Open Heart Surgery;  Laterality: N/A;  . INTRAVASCULAR PRESSURE WIRE/FFR STUDY N/A 08/20/2018   Procedure: INTRAVASCULAR PRESSURE WIRE/FFR STUDY;  Surgeon: Tonny Bollman, MD;  Location: [facility] INVASIVE CV LAB;  Service: Cardiovascular;  Laterality: N/A;  . LEFT HEART CATH AND CORONARY ANGIOGRAPHY N/A 08/20/2018   Procedure: LEFT HEART CATH AND CORONARY ANGIOGRAPHY;  Surgeon: Tonny Bollman, MD;  Location: [facility] INVASIVE CV LAB;  Service: Cardiovascular;   Laterality: N/A;  . TEE WITHOUT CARDIOVERSION N/A 08/23/2018   Procedure: TRANSESOPHAGEAL ECHOCARDIOGRAM (TEE);  Surgeon: Purcell Nails, MD;  Location: [facility] OR;  Service: Open Heart Surgery;  Laterality: N/A;    Social History   Socioeconomic History  . Marital status: Married    Spouse name: Not on file  . Number of children: Not on file  . Years of education: Not on file  . Highest education level: Not on file  Occupational History  . Not on file  Social Needs  . Financial resource strain: Not on file  . Food insecurity:    Worry: Not on file    Inability: Not on file  . Transportation needs:    Medical: Not on file    Non-medical: Not on file  Tobacco Use  . Smoking status: Former Smoker    Packs/day: 0.50    Years: 45.00    Pack years: 22.50    Types: Cigarettes    Start date: 08/12/1970    Last attempt to quit: 08/12/2018    Years since quitting: 0.2  . Smokeless tobacco: Never Used  . Tobacco comment: Quit for 4.5 months in August 2016  Substance and Sexual Activity  . Alcohol use: No    Alcohol/week: 0.0 standard drinks  . Drug use: No  . Sexual activity: Yes  Lifestyle  . Physical activity:    Days per week: Not on file    Minutes per session: Not on file  . Stress: Not on file  Relationships  . Social connections:    Talks on phone: Not on file    Gets together: Not on file    Attends religious service: Not on file    Active member of club or organization: Not on file    Attends meetings of clubs or organizations: Not on file    Relationship status: Not on file  . Intimate partner violence:    Fear of current or ex partner: Not on file    Emotionally abused: Not on file    Physically abused: Not on file    Forced sexual activity: Not on file  Other Topics Concern  . 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 He and his wife were both laid off during recession. Wife Lawson Fiscal is also [facility] patient. Grown children. 1ppd smoker.  Previously used marijuana -quit 10/03          Family History  Problem Relation Age of Onset  . Heart disease Mother   . Diabetes Mother   . Heart failure Mother   . Heart disease Father   . Sudden death Brother     ROS: no fevers or chills, productive cough, hemoptysis, dysphasia, odynophagia, melena, hematochezia, dysuria, hematuria, rash, seizure activity, orthopnea, PND, pedal edema, claudication. Remaining systems are negative.  Physical Exam: Well-developed well-nourished in no acute distress.  Skin is warm and dry.  HEENT is normal.  Neck is supple.  Chest is clear to auscultation with normal expansion.  Cardiovascular exam is regular rate and rhythm.  Abdominal exam nontender or distended. No masses palpated. Extremities show no edema. neuro grossly intact  A/P  1 coronary artery disease status post coronary artery bypass graft-patient has not had recurrent chest pain.  Plan to continue medical therapy with aspirin and statin.  2 carotid artery disease-plan continue medical therapy.  Schedule follow-up carotid Doppler September 2020.  3 hyperlipidemia-continue Repatha.  Patient will need lipids and liver checked early January.  4 hypertension-patient's blood pressure is controlled.  Continue present medications and follow.  5 tobacco abuse-patient discontinued at time of surgery and has not started back.  6 chronic stage III kidney disease-followed by primary care.  7 chronic combined systolic/diastolic congestive heart failure-patient is now euvolemic on examination.  Continue present dose of Lasix.  Check potassium and renal function.  Olga Millers, MD

## 2018-10-16 NOTE — Patient Instructions (Signed)
It was great to meet you today.  I apologize for the long wait time.    You are looking great after your recent hospitalization!  Keep up the good work with the daily walks. Here's a summary of what we talked about:  Lasix: continue taking lasix 40 mg daily  Electrolytes: We're going to check on your electrolytes to make sure your new medication is appropriate.  Follow up in one month to recheck an A1c and discuss diabetes management.

## 2018-10-16 NOTE — Assessment & Plan Note (Addendum)
Colin Keller appears to be doing much better following his recent hospitalization.  Will check his potassium level and renal function today and consider restarting his lisinopril with the results of those labs. His blood pressure is mildly elevated this visit and would tolerate the addition of the ACEi. -Continue Lasix 40 mg daily -Consider restarting lisinopril when BMP results

## 2018-10-17 LAB — BASIC METABOLIC PANEL
BUN / CREAT RATIO: 25 — AB (ref 10–24)
BUN: 39 mg/dL — ABNORMAL HIGH (ref 8–27)
CO2: 24 mmol/L (ref 20–29)
CREATININE: 1.54 mg/dL — AB (ref 0.76–1.27)
Calcium: 9.6 mg/dL (ref 8.6–10.2)
Chloride: 100 mmol/L (ref 96–106)
GFR, EST AFRICAN AMERICAN: 54 mL/min/{1.73_m2} — AB (ref >59)
GFR, EST NON AFRICAN AMERICAN: 47 mL/min/{1.73_m2} — AB (ref >59)
Glucose: 245 mg/dL — ABNORMAL HIGH (ref 65–99)
Potassium: 5.2 mmol/L (ref 3.5–5.2)
SODIUM: 137 mmol/L (ref 134–144)

## 2018-10-17 NOTE — Progress Notes (Signed)
Patient called and informed that his kidney function is slightly worse than expected. He was advised to not make changes to his medication and to return in 2 weeks for another BMP.  He is scheduled for 11/20 at 9:00am.

## 2018-10-23 ENCOUNTER — Other Ambulatory Visit: Payer: Self-pay | Admitting: Family Medicine

## 2018-10-23 DIAGNOSIS — I5033 Acute on chronic diastolic (congestive) heart failure: Secondary | ICD-10-CM

## 2018-10-25 ENCOUNTER — Telehealth: Payer: Self-pay | Admitting: Cardiology

## 2018-10-25 NOTE — Telephone Encounter (Signed)
Spoke with patient of Dr. Jens Somrenshaw. He reports he went to have lab work done today but he has 3-4 unpaid bills from [facility] and cannot pay them at this time, thus they would not do his labs. He wanted to make MD aware that the labs will not be done at this time.

## 2018-10-25 NOTE — Telephone Encounter (Signed)
New message   Patient's wife states that went to [facility] to have blood work done. She states that they would not draw his blood due to monetary reasons. Please call to discuss.

## 2018-10-26 ENCOUNTER — Ambulatory Visit (INDEPENDENT_AMBULATORY_CARE_PROVIDER_SITE_OTHER): Payer: Self-pay | Admitting: Cardiology

## 2018-10-26 ENCOUNTER — Encounter: Payer: Self-pay | Admitting: Cardiology

## 2018-10-26 VITALS — BP 147/77 | HR 65 | Ht 70.0 in | Wt 170.6 lb

## 2018-10-26 DIAGNOSIS — E785 Hyperlipidemia, unspecified: Secondary | ICD-10-CM

## 2018-10-26 DIAGNOSIS — I10 Essential (primary) hypertension: Secondary | ICD-10-CM

## 2018-10-26 DIAGNOSIS — I25110 Atherosclerotic heart disease of native coronary artery with unstable angina pectoris: Secondary | ICD-10-CM

## 2018-10-26 DIAGNOSIS — I5031 Acute diastolic (congestive) heart failure: Secondary | ICD-10-CM

## 2018-10-26 LAB — BASIC METABOLIC PANEL
BUN / CREAT RATIO: 21 (ref 10–24)
BUN: 38 mg/dL — ABNORMAL HIGH (ref 8–27)
CHLORIDE: 97 mmol/L (ref 96–106)
CO2: 24 mmol/L (ref 20–29)
Calcium: 9.9 mg/dL (ref 8.6–10.2)
Creatinine, Ser: 1.77 mg/dL — ABNORMAL HIGH (ref 0.76–1.27)
GFR calc Af Amer: 46 mL/min/{1.73_m2} — ABNORMAL LOW (ref >59)
GFR, EST NON AFRICAN AMERICAN: 40 mL/min/{1.73_m2} — AB (ref >59)
GLUCOSE: 331 mg/dL — AB (ref 65–99)
POTASSIUM: 4.8 mmol/L (ref 3.5–5.2)
Sodium: 136 mmol/L (ref 134–144)

## 2018-10-26 NOTE — Patient Instructions (Signed)
Medication Instructions:  Continue same medications If you need a refill on your cardiac medications before your next appointment, please call your pharmacy.   Lab work: Bmet today  Fasting Lipid and liver panels in January If you have labs (blood work) drawn today and your tests are completely normal, you will receive your results only by: Marland Kitchen. MyChart Message (if you have MyChart) OR . A paper copy in the mail If you have any lab test that is abnormal or we need to change your treatment, we will call you to review the results.  Testing/Procedures: None ordered  Follow-Up: At [facility], you and your health needs are our priority.  As part of our continuing mission to provide you with exceptional heart care, we have created designated Provider Care Teams.  These Care Teams include your primary Cardiologist (physician) and Advanced Practice Providers (APPs -  Physician Assistants and Nurse Practitioners) who all work together to provide you with the care you need, when you need it. . Follow up with Dr.Crenshaw in 6 months  Call 2 months before to schedule

## 2018-10-30 ENCOUNTER — Telehealth: Payer: Self-pay | Admitting: *Deleted

## 2018-10-30 DIAGNOSIS — I5033 Acute on chronic diastolic (congestive) heart failure: Secondary | ICD-10-CM

## 2018-10-30 MED ORDER — FUROSEMIDE 40 MG PO TABS: 20.0000 mg | ORAL_TABLET | Freq: Every day | ORAL | 0 refills | Status: DC

## 2018-10-30 MED ORDER — METOPROLOL TARTRATE 50 MG PO TABS: 50.0000 mg | ORAL_TABLET | Freq: Two times a day (BID) | ORAL | 3 refills | Status: DC

## 2018-10-30 NOTE — Telephone Encounter (Signed)
Spoke with pt, aware of results and he repeated directions regarding the change in furosemide dose. Lab orders mailed to the pt. He has an appt with his medical doctor tomorrow and these results were forwarded to dr Theron Aristapeter frank.

## 2018-10-30 NOTE — Telephone Encounter (Signed)
-----   Message from Lewayne BuntingBrian S Crenshaw, MD sent at 10/26/2018  4:58 PM EST ----- Change lasix to 20 mg daily with additional 20 mg daily as needed for worsening edema; bmet 2 weeks; fu primary care for management of DM Olga MillersBrian Crenshaw

## 2018-10-31 ENCOUNTER — Other Ambulatory Visit: Payer: Self-pay | Admitting: Family Medicine

## 2018-10-31 ENCOUNTER — Other Ambulatory Visit: Payer: Self-pay

## 2018-10-31 DIAGNOSIS — N183 Chronic kidney disease, stage 3 unspecified: Secondary | ICD-10-CM

## 2018-10-31 NOTE — Progress Notes (Unsigned)
referral

## 2018-11-03 ENCOUNTER — Other Ambulatory Visit: Payer: Self-pay | Admitting: Family Medicine

## 2018-11-03 DIAGNOSIS — M545 Low back pain, unspecified: Secondary | ICD-10-CM

## 2018-11-03 DIAGNOSIS — G8929 Other chronic pain: Secondary | ICD-10-CM

## 2018-11-05 ENCOUNTER — Other Ambulatory Visit: Payer: Self-pay

## 2018-11-05 DIAGNOSIS — G8929 Other chronic pain: Secondary | ICD-10-CM

## 2018-11-05 DIAGNOSIS — M545 Low back pain, unspecified: Secondary | ICD-10-CM

## 2018-11-05 MED ORDER — HYDROCODONE-ACETAMINOPHEN 10-325 MG PO TABS: ORAL_TABLET | ORAL | 0 refills | Status: DC

## 2018-11-05 NOTE — Telephone Encounter (Signed)
Patient needs appointment for further chronic pain med refills at this time. No refills will be given without an appointment specifically with me. If patient makes an appointment then I will give him a refill for enough medication to make it to that appointment only.

## 2018-11-05 NOTE — Telephone Encounter (Signed)
Wife is calling for patient, again.  Please call to let them know the status of this refill for pain meds.  Please call home number.

## 2018-11-05 NOTE — Telephone Encounter (Signed)
Wife made appt for 11-23-18 with Dr. [name].  She is aware that he needs to keep this appointment in order to continue to get regular refills.  Colin Keller,CMA

## 2018-11-05 NOTE — Telephone Encounter (Signed)
Patient wife calling for refill on Norco that is due on Wednesday.  [phone]  Ples SpecterAlisa Brake, RN [facility] RN)

## 2018-11-05 NOTE — Telephone Encounter (Signed)
[place] PMP reviewed, no red flags on database. Will dispense #48 pills to be filled on 11/07/18 which will last patient exactly until 11/23/18 as if he is taking every 8 hours as needed then his last dose will be on evening of 11/22/18. Plan to discuss our controlled substance policy with patient and the question of wife diverting his medications from last hospital follow up appointment with Dr. Nelson ChimesAmin.

## 2018-11-12 ENCOUNTER — Other Ambulatory Visit: Payer: Self-pay | Admitting: Family Medicine

## 2018-11-12 ENCOUNTER — Ambulatory Visit: Payer: Self-pay | Admitting: Cardiology

## 2018-11-12 DIAGNOSIS — I5033 Acute on chronic diastolic (congestive) heart failure: Secondary | ICD-10-CM

## 2018-11-12 NOTE — Telephone Encounter (Signed)
Recently refilled by cardiology.

## 2018-11-13 LAB — BASIC METABOLIC PANEL
BUN/Creatinine Ratio: 25 — ABNORMAL HIGH (ref 10–24)
BUN: 38 mg/dL — ABNORMAL HIGH (ref 8–27)
CO2: 23 mmol/L (ref 20–29)
Calcium: 9.8 mg/dL (ref 8.6–10.2)
Chloride: 101 mmol/L (ref 96–106)
Creatinine, Ser: 1.53 mg/dL — ABNORMAL HIGH (ref 0.76–1.27)
GFR calc Af Amer: 55 mL/min/{1.73_m2} — ABNORMAL LOW (ref >59)
GFR calc non Af Amer: 47 mL/min/{1.73_m2} — ABNORMAL LOW (ref >59)
GLUCOSE: 185 mg/dL — AB (ref 65–99)
POTASSIUM: 4.7 mmol/L (ref 3.5–5.2)
SODIUM: 138 mmol/L (ref 134–144)

## 2018-11-14 ENCOUNTER — Encounter: Payer: Self-pay | Admitting: *Deleted

## 2018-11-20 ENCOUNTER — Other Ambulatory Visit: Payer: Self-pay

## 2018-11-20 DIAGNOSIS — E1142 Type 2 diabetes mellitus with diabetic polyneuropathy: Secondary | ICD-10-CM

## 2018-11-20 DIAGNOSIS — Z794 Long term (current) use of insulin: Secondary | ICD-10-CM

## 2018-11-20 DIAGNOSIS — IMO0002 Reserved for concepts with insufficient information to code with codable children: Secondary | ICD-10-CM

## 2018-11-20 DIAGNOSIS — E1165 Type 2 diabetes mellitus with hyperglycemia: Secondary | ICD-10-CM

## 2018-11-20 NOTE — Telephone Encounter (Signed)
Yes, thank you.

## 2018-11-20 NOTE — Telephone Encounter (Signed)
Spoke to pt he told me he  came in on 11/20/18 to pick up Guinea-Bissauresiba sample.

## 2018-11-20 NOTE — Telephone Encounter (Signed)
Patient requested samples of both Novolog and Guinea-Bissauresiba.  Samples provided.   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   Reports blood sugars are doing well on Novolog and Guinea-Bissauresiba.   Medication Samples have been provided to the patient.  Drug name: Novolog Vial       Strength: 100units/ml        Qty: 1  LOT: [id number]: [id number] Exp.Date: 06/10/2020  Drug name: Evaristo Buryresiba       Strength: 200units/ml        Qty: 2  LOT: [id number]  Exp.Date: 07/11/2020           [id number]  08/11/2020 The patient has been instructed regarding the correct time, dose, and frequency of taking this medication, including desired effects and most common side effects.   [name] 5:25 PM 11/20/2018

## 2018-11-20 NOTE — Telephone Encounter (Signed)
Pts wife called nurse line requesting a Tresiba sample. Ok to leave up front for him?

## 2018-11-23 ENCOUNTER — Other Ambulatory Visit: Payer: Self-pay

## 2018-11-23 ENCOUNTER — Encounter: Payer: Self-pay | Admitting: Family Medicine

## 2018-11-23 ENCOUNTER — Ambulatory Visit: Payer: Self-pay | Admitting: Family Medicine

## 2018-11-23 VITALS — BP 140/54 | HR 68 | Temp 98.1°F | Ht 70.0 in | Wt 171.0 lb

## 2018-11-23 DIAGNOSIS — I739 Peripheral vascular disease, unspecified: Secondary | ICD-10-CM | POA: Insufficient documentation

## 2018-11-23 DIAGNOSIS — G8929 Other chronic pain: Secondary | ICD-10-CM

## 2018-11-23 DIAGNOSIS — M545 Low back pain, unspecified: Secondary | ICD-10-CM

## 2018-11-23 MED ORDER — HYDROCODONE-ACETAMINOPHEN 10-325 MG PO TABS: 1.0000 | ORAL_TABLET | Freq: Three times a day (TID) | ORAL | 0 refills | Status: DC | PRN

## 2018-11-23 NOTE — Patient Instructions (Signed)
It was good to see you today!  Come back in 1 month for diabetes follow up and for ABI testing. You can come back sooner if you like.   Keep walking right up until JUST BEFORE you have pain. Kepp taking plavix and cholesterol medication. EXCELLENT job of staying off smoking.     Take care,  Dr. Leland HerElsia J Arvis Miguez, DO [facility]     Peripheral Vascular Disease Peripheral vascular disease (PVD) is a disease of the blood vessels that are not part of your heart and brain. A simple term for PVD is poor circulation. In most cases, PVD narrows the blood vessels that carry blood from your heart to the rest of your body. This can result in a decreased supply of blood to your arms, legs, and internal organs, like your stomach or kidneys. However, it most often affects a person's lower legs and feet. There are two types of PVD.  Organic PVD. This is the more common type. It is caused by damage to the structure of blood vessels.  Functional PVD. This is caused by conditions that make blood vessels contract and tighten (spasm).  Without treatment, PVD tends to get worse over time. PVD can also lead to acute ischemic limb. This is when an arm or limb suddenly has trouble getting enough blood. This is a medical emergency. Follow these instructions at home:  Take medicines only as told by your doctor.  Do not use any tobacco products, including cigarettes, chewing tobacco, or electronic cigarettes. If you need help quitting, ask your doctor.  Lose weight if you are overweight, and maintain a healthy weight as told by your doctor.  Eat a diet that is low in fat and cholesterol. If you need help, ask your doctor.  Exercise regularly. Ask your doctor for some good activities for you.  Take good care of your feet. ? Wear comfortable shoes that fit well. ? Check your feet often for any cuts or sores. Contact a doctor if:  You have cramps in your legs while walking.  You have leg  pain when you are at rest.  You have coldness in a leg or foot.  Your skin changes.  You are unable to get or have an erection (erectile dysfunction).  You have cuts or sores on your feet that are not healing. Get help right away if:  Your arm or leg turns cold and blue.  Your arms or legs become red, warm, swollen, painful, or numb.  You have chest pain or trouble breathing.  You suddenly have weakness in your face, arm, or leg.  You become very confused or you cannot speak.  You suddenly have a very bad headache.  You suddenly cannot see. This information is not intended to replace advice given to you by your health care provider. Make sure you discuss any questions you have with your health care provider. Document Released: 02/22/2010 Document Revised: 05/05/2016 Document Reviewed: 05/08/2014 Elsevier Interactive Patient Education  2017 [employer]

## 2018-11-23 NOTE — Assessment & Plan Note (Signed)
Symptoms are concerning for peripheral artery disease.  Unfortunately unable to perform ABIs in clinic today as point-of-care testing will be ruled out on Monday.  Recommended patient can come back at his convenience for this, he will come back in 1 month but voiced good understanding that he can come back sooner if desired.  As his symptoms seem mild and he does not have any critical limb ischemia, recommended that he continues walking and stays compliant on his antiplatelet and cholesterol medication.  Reviewed return precautions including temperature changes, discoloration, numbness.  Patient voiced good understanding

## 2018-11-23 NOTE — Progress Notes (Signed)
Subjective:  Colin Keller is a 62 y.o. male who presents to the [facility] today for chronic pain management and also with c/o leg pain  HPI:  Indication for chronic opioid: herniated disk Medication and dose: norco 10-325mg  TID prn # pills per month: 90 Last UDS date: 02/2015 Opioid Treatment Agreement signed (Y/N): yes Opioid Treatment Agreement last reviewed with patient:   uncertain NCCSRS reviewed this encounter (include red flags):   11/22/18 He states that he does not run out early, he does not have any concerns with his wife diverting his pain medications.  He is agreeable to urine drug testing but is concerned about the cost.  He is amenable to titrating down on his medications as there are some days where he can skip the day dose.  He did not realize that he could do so on his own but rather was trying to take the medication as prescribed so has been taking it 3 times a day consistently.  Leg pain Patient states that ever since his heart surgery he has been walking daily.  For the first 3 weeks after surgery he was walking well without any issues.  Then suddenly he had leg pain after walking a certain distance, this has since continued, it resolves upon rest.  He says that this is a severe pain that keeps him from walking further.  He has continued to day off of smoking.  He is taking his baby aspirin and Plavix and cholesterol medication.   He has noticed that his right leg is continued to be more swollen which is where they harvested the vein.  The swelling is going down. He is following up with cardiology and cardiovascular surgery soon. He denies any discoloration of his feet or temperature changes.   ROS: Per HPI  Social Hx: He reports that he quit smoking about 3 months ago. His smoking use included cigarettes. He started smoking about 48 years ago. He has a 22.50 pack-year smoking history. He has never used smokeless tobacco. He reports that he does not drink alcohol or use  drugs.   Objective:  Physical Exam: BP (!) 140/54   Pulse 68   Temp 98.1 F (36.7 C) (Oral)   Ht 5\' 10"  (1.778 m)   Wt 171 lb (77.6 kg)   SpO2 97%   BMI 24.54 kg/m   Gen: NAD, resting comfortably CV: RRR with no murmurs appreciated Pulm: NWOB, CTAB with no crackles, wheezes, or rhonchi GI: Normal bowel sounds present. Soft, Nontender, Nondistended. MSK: no edema, cyanosis, or clubbing noted. DP and PT pulses are intact/ Feet are warm to touch with good sensation. No homans. Skin: warm, dry Neuro: grossly normal, moves all extremities Psych: Normal affect and thought content    Assessment/Plan:  Claudication of both lower extremities (HCC) Symptoms are concerning for peripheral artery disease.  Unfortunately unable to perform ABIs in clinic today as point-of-care testing will be ruled out on Monday.  Recommended patient can come back at his convenience for this, he will come back in 1 month but voiced good understanding that he can come back sooner if desired.  As his symptoms seem mild and he does not have any critical limb ischemia, recommended that he continues walking and stays compliant on his antiplatelet and cholesterol medication.  Reviewed return precautions including temperature changes, discoloration, numbness.  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  There was some concern  regarding his wife possibly diverting his medications at last visit.  Discussed with patient who was amenable to titrating down and does not have any concerns of his wife diverting as she has her own prescription.  Reviewed controlled substance policy with patient and signed again today, patient was given his own copy.  Initially had discussed performing urine drug screen today however due to financial cost and patient's lack of insurance, opted to delay until point-of-care more affordable urine drug screen is  available.  Continue Norco 10/325 every 8 hour as needed but decrease pills per month from 90-70.  Discussed with patient that would likely titrate down further in the future.  Patient voiced good understanding was very appropriate.   Leland HerElsia J Lorren Splawn, DO PGY-3, [facility] 11/23/2018 9:50 AM

## 2018-11-23 NOTE — Assessment & Plan Note (Signed)
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  There was some concern regarding his wife possibly diverting his medications at last visit.  Discussed with patient who was amenable to titrating down and does not have any concerns of his wife diverting as she has her own prescription.  Reviewed controlled substance policy with patient and signed again today, patient was given his own copy.  Initially had discussed performing urine drug screen today however due to financial cost and patient's lack of insurance, opted to delay until point-of-care more affordable urine drug screen is available.  Continue Norco 10/325 every 8 hour as needed but decrease pills per month from 90-70.  Discussed with patient that would likely titrate down further in the future.  Patient voiced good understanding was very appropriate.

## 2018-12-13 LAB — HEPATIC FUNCTION PANEL
ALBUMIN: 4.2 g/dL (ref 3.6–4.8)
ALK PHOS: 81 IU/L (ref 39–117)
ALT: 12 IU/L (ref 0–44)
AST: 13 IU/L (ref 0–40)
BILIRUBIN TOTAL: 0.2 mg/dL (ref 0.0–1.2)
BILIRUBIN, DIRECT: 0.1 mg/dL (ref 0.00–0.40)
TOTAL PROTEIN: 6.8 g/dL (ref 6.0–8.5)

## 2018-12-13 LAB — LIPID PANEL W/O CHOL/HDL RATIO
Cholesterol, Total: 153 mg/dL (ref 100–199)
HDL: 61 mg/dL (ref >39)
LDL Calculated: 67 mg/dL (ref 0–99)
Triglycerides: 124 mg/dL (ref 0–149)
VLDL Cholesterol Cal: 25 mg/dL (ref 5–40)

## 2018-12-14 ENCOUNTER — Encounter: Payer: Self-pay | Admitting: *Deleted

## 2018-12-18 ENCOUNTER — Other Ambulatory Visit (HOSPITAL_COMMUNITY): Payer: Self-pay | Admitting: Physician Assistant

## 2018-12-19 ENCOUNTER — Other Ambulatory Visit (HOSPITAL_COMMUNITY): Payer: Self-pay | Admitting: Physician Assistant

## 2018-12-24 ENCOUNTER — Other Ambulatory Visit: Payer: Self-pay

## 2018-12-24 DIAGNOSIS — G8929 Other chronic pain: Secondary | ICD-10-CM

## 2018-12-24 DIAGNOSIS — M545 Low back pain, unspecified: Secondary | ICD-10-CM

## 2018-12-25 MED ORDER — HYDROCODONE-ACETAMINOPHEN 10-325 MG PO TABS: 1.0000 | ORAL_TABLET | Freq: Three times a day (TID) | ORAL | 0 refills | Status: DC | PRN

## 2018-12-25 NOTE — Telephone Encounter (Signed)
[place] PMP reviewed. No red flags. 

## 2018-12-29 ENCOUNTER — Other Ambulatory Visit (HOSPITAL_COMMUNITY): Payer: Self-pay | Admitting: Physician Assistant

## 2018-12-31 ENCOUNTER — Other Ambulatory Visit: Payer: Self-pay

## 2018-12-31 ENCOUNTER — Telehealth: Payer: Self-pay | Admitting: Family Medicine

## 2018-12-31 ENCOUNTER — Ambulatory Visit (INDEPENDENT_AMBULATORY_CARE_PROVIDER_SITE_OTHER): Payer: Self-pay | Admitting: Thoracic Surgery (Cardiothoracic Vascular Surgery)

## 2018-12-31 ENCOUNTER — Encounter: Payer: Self-pay | Admitting: Thoracic Surgery (Cardiothoracic Vascular Surgery)

## 2018-12-31 ENCOUNTER — Other Ambulatory Visit (HOSPITAL_COMMUNITY): Payer: Self-pay | Admitting: Physician Assistant

## 2018-12-31 VITALS — BP 170/88 | HR 52 | Resp 18 | Ht 70.0 in | Wt 173.4 lb

## 2018-12-31 DIAGNOSIS — Z951 Presence of aortocoronary bypass graft: Secondary | ICD-10-CM

## 2018-12-31 NOTE — Progress Notes (Signed)
[address]       [place] [address]             [id number]     CARDIOTHORACIC SURGERY OFFICE NOTE  Referring Provider is Colin Bollman, MD  Primary Cardiologist is Colin Bunting, MD PCP is Colin Her, DO   HPI:  Patient is a 63 year old male with coronary artery disease, hypertension, poorly controlled insulin-dependent type 2 diabetes mellitus with multiple complications, hyperlipidemia, and long-standing tobacco abuse who returns to the office today for routine follow-up status post coronary artery bypass grafting x3 on August 23, 2018 for severe left main and three-vessel coronary artery disease status post acute non-ST segment elevation myocardial infarction.  The patient's early postoperative recovery was uneventful, although he was readmitted to the hospital in late October for acute on chronic diastolic congestive heart failure which improved with diuretic therapy.  Echocardiogram performed at that time demonstrated reasonably well preserved left ventricular function with ejection fraction estimated 50%, unchanged from echocardiogram performed prior to surgery.  Since then he has done well and has been seen in follow-up by Dr. Jens Keller on October 26, 2018 at which time he was doing well.  He saw his primary care physician in early December and reported a single episode of bilateral hip and thigh pain that was felt possibly consistent with claudication.  He returns to our office today and reports is doing very well.  He has not had any further episodes of leg pain, although he admits that recently he has been not been walking as much.  He did not roll or participate in the cardiac rehab program.  He no longer has any pain in his chest.  He denies any shortness of breath.  Activity level is good.  He has never had any exertional chest pain or chest tightness since his surgery.  He has not been smoking.  He states his blood sugars have been up and down a  little bit.   Current Outpatient Medications  Medication Sig Dispense Refill  . acetaminophen (TYLENOL) 500 MG tablet Take 2 tablets (1,000 mg total) by mouth every 6 (six) hours as needed for mild pain. (Patient taking differently: Take 1,000 mg by mouth at bedtime. ) 30 tablet 0  . aspirin 81 MG tablet Take 81 mg by mouth daily.    . clopidogrel (PLAVIX) 75 MG tablet Take 1 tablet (75 mg total) by mouth daily. 30 tablet 3  . Evolocumab (REPATHA [place]) Inject 1 Dose into the skin every 21 ( twenty-one) days. Injection every 2 weeks.    . Famotidine 20 MG CHEW Chew 20 mg by mouth daily as needed (for reflux).    . furosemide (LASIX) 40 MG tablet Take 0.5 tablets (20 mg total) by mouth daily. 30 tablet 0  . HYDROcodone-acetaminophen (NORCO) 10-325 MG tablet Take 1 tablet by mouth every 8 (eight) hours as needed for moderate pain or severe pain. (Patient taking differently: Take 1 tablet by mouth 2 (two) times daily as needed for moderate pain or severe pain. ) 70 tablet 0  . insulin aspart (NOVOLOG) 100 UNIT/ML injection Inject 10-12 Units into the skin 2 (two) times daily with a meal. 4-5 units BKFST, 4-7 units with dinner 10 mL 0  . insulin degludec (TRESIBA FLEXTOUCH) 100 UNIT/ML SOPN FlexTouch Pen Inject 0.14-0.18 mLs (14-18 Units total) into the skin every morning. 2 pen 0  . Menthol, Topical Analgesic, (BIOFREEZE ROLL-ON) 4 % GEL Apply 1 application  topically as needed (shoulder pain).    . metoprolol tartrate (LOPRESSOR) 50 MG tablet Take 1 tablet (50 mg total) by mouth 2 (two) times daily. 180 tablet 3  . pravastatin (PRAVACHOL) 20 MG tablet Take 1 tablet (20 mg total) by mouth daily. 30 tablet 0   No current facility-administered medications for this visit.       Physical Exam:   BP (!) 170/88 (BP Location: Right Arm, Patient Position: Sitting, Cuff Size: Normal)   Pulse (!) 52   Resp 18   Ht 5\' 10"  (1.778 Keller)   Wt 173 lb 6.4 oz (78.7 kg)   SpO2 98% Comment: RA  BMI 24.88 kg/Keller    General:  Well-appearing  Chest:   Clear to auscultation  CV:   Regular rate and rhythm without murmur  Incisions:  Completely healed  Abdomen:  Soft nontender  Extremities:  Warm and well-perfused  Diagnostic Tests:  Transthoracic Echocardiography  Patient:    Colin Keller, Colin Keller MR #:       [id number] Study Date: 10/08/2018 Gender:     Keller Age:        84 Height:     180.3 cm Weight:     70.6 kg BSA:        1.88 Keller^2 Pt. Status: Room:       6E15C   ATTENDING    Colin Keller  PERFORMING   Colin Keller  REFERRING    Colin Keller  SONOGRAPHER  Colin Keller  ADMITTING    Colin Keller  ORDERING     Colin Keller  REFERRING    Colin Keller  cc:  -------------------------------------------------------------------  ------------------------------------------------------------------- Indications:      CHF - 428.0.  ------------------------------------------------------------------- History:   PMH:  Chronic kidney disease.  Coronary artery disease. Congestive heart failure.  Risk factors:  Current tobacco use. Hypertension. Diabetes mellitus. Dyslipidemia.  ------------------------------------------------------------------- Study Conclusions  - Left ventricle: LVEF is approximately 50% with hypokinesis of the   lateral (base/mid) and basal inferior walls The cavity size was   normal. - Mitral valve: Calcified annulus. Mildly thickened leaflets .   There was mild regurgitation. - Left atrium: The atrium was moderately dilated. - Right atrium: The atrium was mildly dilated.  ------------------------------------------------------------------- Labs, prior tests, procedures, and surgery: Coronary artery bypass grafting.  ------------------------------------------------------------------- Study data:  Comparison was made to the study of 08/20/2018.  Study status:  Routine.  Procedure:  The patient reported no pain pre or post test. Transthoracic  echocardiography. Image quality was adequate.          Transthoracic echocardiography.  Keller-mode, complete 2D, spectral Doppler, and color Doppler.  Birthdate: Patient birthdate: 08/17/1956.  Age:  Patient is 63 yr old.  Sex: Gender: male.    BMI: 21.7 kg/Keller^2.  Blood pressure:     128/72 Patient status:  Keller.  Study date:  Study date: 10/08/2018. Study time: 11:12 AM.  Location:  Bedside.  -------------------------------------------------------------------  ------------------------------------------------------------------- Left ventricle:  LVEF is approximately 50% with hypokinesis of the lateral (base/mid) and basal inferior walls The cavity size was normal.  ------------------------------------------------------------------- Aortic valve:   Mildly thickened leaflets.  Doppler:  There was no significant regurgitation.  ------------------------------------------------------------------- Mitral valve:   Calcified annulus. Mildly thickened leaflets . Doppler:  There was mild regurgitation.    Peak gradient (D): 4 mm Hg.  ------------------------------------------------------------------- Left atrium:  The atrium was moderately dilated.  ------------------------------------------------------------------- Right ventricle:  The cavity size was normal. Wall thickness was normal.  Systolic function was normal.  ------------------------------------------------------------------- Tricuspid valve:   Structurally normal valve.   Leaflet separation was normal.  Doppler:  Transvalvular velocity was within the normal range. There was trivial regurgitation.  ------------------------------------------------------------------- Right atrium:  The atrium was mildly dilated.  ------------------------------------------------------------------- Pericardium:  There was no pericardial effusion.  ------------------------------------------------------------------- Systemic  veins: Inferior vena cava: The vessel was dilated. The respirophasic diameter changes were blunted (< 50%), consistent with elevated central venous pressure.  ------------------------------------------------------------------- Measurements   Left ventricle                           Value        Reference  LV ID, ED, PLAX chordal                  51.1  mm     43 - 52  LV ID, ES, PLAX chordal          (H)     40.4  mm     23 - 38  LV fx shortening, PLAX chordal   (L)     21    %      >=29  LV PW thickness, ED                      10.4  mm     ----------  IVS/LV PW ratio, ED                      1.03         <=1.3  Stroke volume, 2D                        63    ml     ----------  Stroke volume/bsa, 2D                    34    ml/Keller^2 ----------  LV ejection fraction, 1-p A4C            50    %      ----------  LV end-diastolic volume, 2-p             116   ml     ----------  LV end-systolic volume, 2-p              62    ml     ----------  LV ejection fraction, 2-p                47    %      ----------  Stroke volume, 2-p                       55    ml     ----------  LV end-diastolic volume/bsa, 2-p         62    ml/Keller^2 ----------  LV end-systolic volume/bsa, 2-p          33    ml/Keller^2 ----------  Stroke volume/bsa, 2-p                   29    ml/Keller^2 ----------  LV e&', lateral                           8.81  cm/s   ----------  LV E/e&', lateral  11.1         ----------  LV e&', medial                            4.57  cm/s   ----------  LV E/e&', medial                          21.4         ----------  LV e&', average                           6.69  cm/s   ----------  LV E/e&', average                         14.62        ----------    Ventricular septum                       Value        Reference  IVS thickness, ED                        10.7  mm     ----------    LVOT                                     Value        Reference  LVOT ID, S                                20    mm     ----------  LVOT area                                3.14  cm^2   ----------  LVOT peak velocity, S                    95.3  cm/s   ----------  LVOT mean velocity, S                    64    cm/s   ----------  LVOT VTI, S                              20.2  cm     ----------  LVOT peak gradient, S                    4     mm Hg  ----------    Aorta                                    Value        Reference  Aortic root ID, ED                       33    mm     ----------    Left atrium  Value        Reference  LA ID, A-P, ES                           39    mm     ----------  LA ID/bsa, A-P                           2.08  cm/Keller^2 <=2.2  LA volume, S                             89.2  ml     ----------  LA volume/bsa, S                         47.5  ml/Keller^2 ----------  LA volume, ES, 1-p A4C                   85.6  ml     ----------  LA volume/bsa, ES, 1-p A4C               45.6  ml/Keller^2 ----------  LA volume, ES, 1-p A2C                   83.9  ml     ----------  LA volume/bsa, ES, 1-p A2C               44.7  ml/Keller^2 ----------    Mitral valve                             Value        Reference  Mitral E-wave peak velocity              97.8  cm/s   ----------  Mitral A-wave peak velocity              64.8  cm/s   ----------  Mitral deceleration time                 158   ms     150 - 230  Mitral peak gradient, D                  4     mm Hg  ----------  Mitral E/A ratio, peak                   1.5          ----------    Pulmonary arteries                       Value        Reference  PA pressure, S, DP                       27    mm Hg  <=30    Tricuspid valve                          Value        Reference  Tricuspid regurg peak velocity           216   cm/s   ----------  Tricuspid peak RV-RA gradient  19    mm Hg  ----------    Right atrium                             Value        Reference  RA ID, S-I, ES, A4C              (H)      56.5  mm     34 - 49  RA area, ES, A4C                 (H)     20.3  cm^2   8.3 - 19.5  RA volume, ES, A/L                       61.1  ml     ----------  RA volume/bsa, ES, A/L                   32.6  ml/Keller^2 ----------    Systemic veins                           Value        Reference  Estimated CVP                            8     mm Hg  ----------    Right ventricle                          Value        Reference  TAPSE                                    14.1  mm     ----------  RV pressure, S, DP                       27    mm Hg  <=30  RV s&', lateral, S                        7.62  cm/s   ----------  Legend: (L)  and  (H)  mark values outside specified reference range.  ------------------------------------------------------------------- Prepared and Electronically Authenticated by  Dietrich Pates, Keller.D. 2019-10-28T18:17:28   Impression:  Patient appears to be doing well approximately 4 months status post coronary artery bypass grafting  Plan:  I have encouraged the patient to continue to gradually increase his physical activity without any particular limitations.  I have encouraged him to get back into a regular exercise program to include at the very least walking on a regular basis.  We have discussed the importance of heart healthy diet and regular exercise.  We have discussed the importance of tight glycemic control and long-term attention to his diabetes management.  We have not recommended any changes to his current medications.  All of his questions have been addressed.  The patient will continue to follow-up with Dr. Jens Keller and return to our office for routine follow-up next fall, approximately 1 year following his surgery.  I spent in excess of 10 minutes during the conduct of this office consultation and >50% of this time involved direct face-to-face  encounter with the patient for counseling and/or coordination of their care.    Salvatore Decent. Cornelius Moras,  MD 12/31/2018 3:55 PM

## 2018-12-31 NOTE — Telephone Encounter (Signed)
Will we be able to to provide ABIs soon? When I last talked to Dr. Raymondo Band, I was told that it would be available a few weeks ago.

## 2018-12-31 NOTE — Patient Instructions (Addendum)
Continue all previous medications without any changes at this time  Monitor your blood pressure regularly and discuss whether or not your blood pressure medications should be adjusted with your cardiologist and/or primary care physician, particularly if your systolic blood pressure consistently measures > 140 mmHg and/or your diastolic blood pressure measures > 90 mmHg  You may resume unrestricted physical activity without any particular limitations at this time.  Make every effort to keep your diabetes under very tight control.  Follow up closely with your primary care physician or endocrinologist and strive to keep their hemoglobin A1c levels as low as possible, preferably near or below 6.0.  The long term benefits of strict control of diabetes are far reaching and critically important for your overall health and survival.  Make every effort to maintain a "heart-healthy" lifestyle with regular physical exercise and adherence to a low-fat, low-carbohydrate diet.  Continue to seek regular follow-up appointments with your primary care physician and/or cardiologist.

## 2018-12-31 NOTE — Telephone Encounter (Signed)
Patient says he was told he could come in today w/o an appointment to be seen for a leg circulation test.  I asked Dr. Raymondo Band and Lattie Corns and Deseree but they say we are not ready/trained for that yet (?)  Please call patient to let him know what to do as he is thinking we are able to do that right now.  Also, please let front office staff know what to do, for now, about these type visits.  Thanks.  Best number to call [phone]

## 2019-01-01 NOTE — Telephone Encounter (Signed)
Following up on request for more information on when POC ABIs will be available in our clinic as conflicting information has been given to me. If not available for at least several weeks then I would plan to order formal ABIs for this patient. If available, it would be most cost effective for this patient to get these in our clinic. Please advise.

## 2019-01-02 ENCOUNTER — Other Ambulatory Visit: Payer: Self-pay | Admitting: *Deleted

## 2019-01-02 MED ORDER — CLOPIDOGREL BISULFATE 75 MG PO TABS: 75.0000 mg | ORAL_TABLET | Freq: Every day | ORAL | 3 refills | Status: DC

## 2019-01-02 NOTE — Telephone Encounter (Signed)
I have checked off most of the CMA's on the ABI machine yesterday, only 2 more left. Pt should be in to see an actual doctor/have an appt in order to get one done, not a walk-in. Please advise your nurse to call and schedule him to come in a see you or other team member. Deseree Bruna Potter, CMA

## 2019-01-02 NOTE — Telephone Encounter (Signed)
Please schedule patient for appointment for poc ABI as per Deseree's recommendation.

## 2019-01-03 ENCOUNTER — Ambulatory Visit: Payer: Self-pay

## 2019-01-03 NOTE — Telephone Encounter (Signed)
Lm for patient ok per DPR and identified VM asking him to call back and make an appointment with Dr. Artist Pais to have the ABIs performed in the office.  Cornella Emmer,CMA

## 2019-01-07 ENCOUNTER — Telehealth: Payer: Self-pay | Admitting: Cardiology

## 2019-01-07 NOTE — Telephone Encounter (Signed)
Called patient, he states that he woke this morning with a nose bleed. Stating that he took two advil last night, after he read up on his plavix, advised him not to take NSAIDs patient states this is what caused his issues, but wants to make sure he would be okay to continue his medication today. I spoke with PharmD who stated he could continue his medications now, and would suggest he try benadryl next time for his trouble sleeping. Patient verbalized understanding.

## 2019-01-07 NOTE — Telephone Encounter (Signed)
new to my practice Message:         Pt said he had a nosebleed this morning. He says he thinks it is because he took 2 Advil.  Pt is on Plavix and a Bayer Aspirin each day. His question is should he take them today?

## 2019-01-18 ENCOUNTER — Telehealth: Payer: Self-pay | Admitting: *Deleted

## 2019-01-18 DIAGNOSIS — E1165 Type 2 diabetes mellitus with hyperglycemia: Secondary | ICD-10-CM

## 2019-01-18 DIAGNOSIS — E1142 Type 2 diabetes mellitus with diabetic polyneuropathy: Secondary | ICD-10-CM

## 2019-01-18 DIAGNOSIS — IMO0002 Reserved for concepts with insufficient information to code with codable children: Secondary | ICD-10-CM

## 2019-01-18 DIAGNOSIS — Z794 Long term (current) use of insulin: Secondary | ICD-10-CM

## 2019-01-18 MED ORDER — INSULIN ASPART 100 UNIT/ML ~~LOC~~ SOLN: 10.0000 [IU] | Freq: Two times a day (BID) | SUBCUTANEOUS | 0 refills | Status: DC

## 2019-01-18 MED ORDER — INSULIN DEGLUDEC 100 UNIT/ML ~~LOC~~ SOPN: 14.0000 [IU] | PEN_INJECTOR | SUBCUTANEOUS | 0 refills | Status: DC

## 2019-01-18 NOTE — Telephone Encounter (Signed)
Pt lm on nurse line requesting "more samples of insulin from Dr. Raymondo Band."  We have a limited supply but I was able to out aside 2 of tresiba and 2 novolog for pt.    LMOVM informing him. [place], Maryjo Rochester, CMA

## 2019-01-19 NOTE — Telephone Encounter (Signed)
Cosigned sample orders.

## 2019-01-22 ENCOUNTER — Other Ambulatory Visit: Payer: Self-pay | Admitting: Family Medicine

## 2019-01-22 DIAGNOSIS — M545 Low back pain, unspecified: Secondary | ICD-10-CM

## 2019-01-22 DIAGNOSIS — G8929 Other chronic pain: Secondary | ICD-10-CM

## 2019-01-22 DIAGNOSIS — I5033 Acute on chronic diastolic (congestive) heart failure: Secondary | ICD-10-CM

## 2019-01-23 ENCOUNTER — Telehealth: Payer: Self-pay | Admitting: Cardiology

## 2019-01-23 DIAGNOSIS — I5033 Acute on chronic diastolic (congestive) heart failure: Secondary | ICD-10-CM

## 2019-01-23 NOTE — Telephone Encounter (Signed)
Juab PMP reviewed today, no red flags. Has appt scheduled at the end of the month.

## 2019-01-23 NOTE — Telephone Encounter (Addendum)
Wife called concerned about patient taking the Lasix daily given the fact that he has issues with elevated kidney functions. He is being referred to Nephrology but has to wait until they have insurance.  Per wife patient is not retaining fluid anymore. I tried to explain that this could be because he is taking the Lasix. She stated he does have some shortness of breath at times when he is really exerting himself. Wife would like for Dr Jens Som to review and see if patient needs to continue Lasix. Will forward to Dr Jens Som for review   Call patient on cell [phone]

## 2019-01-23 NOTE — Telephone Encounter (Signed)
New Message:  Patient wife calling about some medication that he is taking.please call his wife.

## 2019-01-24 NOTE — Telephone Encounter (Signed)
Continue lasix; check bmet Olga Millers

## 2019-01-24 NOTE — Telephone Encounter (Signed)
Spoke with pt, Aware of dr crenshaw's recommendations.  Lab orders mailed to the pt  

## 2019-02-04 ENCOUNTER — Telehealth: Payer: Self-pay | Admitting: Cardiology

## 2019-02-04 ENCOUNTER — Other Ambulatory Visit: Payer: Self-pay

## 2019-02-04 ENCOUNTER — Encounter: Payer: Self-pay | Admitting: Family Medicine

## 2019-02-04 ENCOUNTER — Ambulatory Visit: Payer: Self-pay | Admitting: Family Medicine

## 2019-02-04 VITALS — BP 124/70 | Temp 98.2°F | Ht 70.0 in | Wt 170.0 lb

## 2019-02-04 DIAGNOSIS — I739 Peripheral vascular disease, unspecified: Secondary | ICD-10-CM

## 2019-02-04 DIAGNOSIS — E1165 Type 2 diabetes mellitus with hyperglycemia: Secondary | ICD-10-CM

## 2019-02-04 LAB — POCT GLYCOSYLATED HEMOGLOBIN (HGB A1C): HbA1c, POC (controlled diabetic range): 10.6 % — AB (ref 0.0–7.0)

## 2019-02-04 NOTE — Patient Instructions (Signed)
We will check your kidney function today, if it allows will restart metformin. Start back at 500mg  for 3 days before going up to 1000mg . The goal is to get back to 1000mg   twice a day.  If your kidney function does not allow the restart of metformin, we can talk about increasing your insulin dose.   Something to think about is that when you get medicare we should consider starting a diabetes medicine like jardiance or invokana. It is both heart and kidney protective.   Your ABI, the test for PAD is negative today which is good.    Come back to see me in 1 month with your home logs in case we need to adjust insulin.

## 2019-02-04 NOTE — Progress Notes (Signed)
Subjective:  Colin Keller is a 63 y.o. male who presents to the [facility] today for diabetes and claudication follow up  HPI:  Diabetes medication compliance: compliant all of the time.  Tresiba 14 units in the morning and NovoLog 10 units twice daily with breakfast and dinner.  Has not been on metformin since September when he was taken off of it during his hospital stay.  He is awaiting to enroll in Medicare and is still on self-pay.  He has an appointment at [facility] coming up soon to discuss enrollment. diabetic diet compliance: compliant all of the time home glucose monitoring: is performed sporadically.  Checks his sugars pretty regularly in the mornings and they have been in the 150-180s to low 200s.  He has had 2-3 readings of 70 in the last 2 months but did not feel bad with them.  He usually checks his sugars again in the evenings which have been as high as the 300s.  This is improvement from the past when he was unreasonably high. further diabetic ROS: no polyuria or polydipsia, no chest pain, dyspnea or TIA's, no numbness, tingling or pain in extremities, no unusual visual symptoms, no hypoglycemia, no medication side effects noted.     Claudication He states that he has been walking regularly since September.  He has also managed to stay off cigarettes since then.  He has not been walking as much over the last few weeks as he has been very busy with a paint job. He states that he pretty much no longer has leg pain, it seems to have nearly resolved although he does still have very occasional cramping and only his right leg He is still taking his baby aspirin and Plavix and cholesterol medication. No swelling.  No skin changes.  No numbness.  No temperature changes in his feet    ROS: Per HPI  Social Hx: He reports that he quit smoking about 5 months ago. His smoking use included cigarettes. He started smoking about 48 years ago. He has a 22.50 pack-year smoking history. He  has never used smokeless tobacco. He reports that he does not drink alcohol or use drugs.   Objective:  Physical Exam: BP 124/70   Temp 98.2 F (36.8 C) (Oral)   Ht 5\' 10"  (1.778 m)   Wt 170 lb (77.1 kg)   BMI 24.39 kg/m   Gen: NAD, resting comfortably CV: RRR with no murmurs appreciated Pulm: NWOB, CTAB with no crackles, wheezes, or rhonchi GI: Normal bowel sounds present. Soft, Nontender, Nondistended. MSK: no edema, cyanosis, or clubbing noted Skin: warm, dry Neuro: grossly normal, moves all extremities Psych: Normal affect and thought content  Results for orders placed or performed in visit on 02/04/19 (from the past 72 hour(s))  HgB A1c     Status: Abnormal   Collection Time: 02/04/19  1:34 PM  Result Value Ref Range   Hemoglobin A1C     HbA1c POC (<> result, manual entry)     HbA1c, POC (prediabetic range)     HbA1c, POC (controlled diabetic range) 10.6 (A) 0.0 - 7.0 %     Assessment/Plan:  DM (diabetes mellitus), type 2, uncontrolled (HCC) A1c is elevated at 10.6 today from 9.5 last September 2019.  He was taken off metformin in the setting of acute kidney injury when he had his CABG in September 2019.  Per chart review his GFR has been in the high 40s.  Will recheck today and if greater  than 45 can restart metformin.  If cannot restart metformin then will need to adjust his insulin.  He is a good candidate for an SGLT2 however he is self-pay.  He will soon be enrolled in Medicare, hopeful that an SGLT2 will be an option for him when he gets insurance.  Claudication of both lower extremities (HCC) Point-of-care ABIs in clinic today are negative.  Furthermore patient is reporting significant symptomatic improvement with home walking program and continued smoking cessation.  Recommended continued walking at home as well as staying on antiplatelet and cholesterol medication.   [name], DO PGY-3, [facility] 02/04/2019 1:50 PM

## 2019-02-04 NOTE — Assessment & Plan Note (Signed)
Point-of-care ABIs in clinic today are negative.  Furthermore patient is reporting significant symptomatic improvement with home walking program and continued smoking cessation.  Recommended continued walking at home as well as staying on antiplatelet and cholesterol medication.

## 2019-02-04 NOTE — Assessment & Plan Note (Signed)
A1c is elevated at 10.6 today from 9.5 last September 2019.  He was taken off metformin in the setting of acute kidney injury when he had his CABG in September 2019.  Per chart review his GFR has been in the high 40s.  Will recheck today and if greater than 45 can restart metformin.  If cannot restart metformin then will need to adjust his insulin.  He is a good candidate for an SGLT2 however he is self-pay.  He will soon be enrolled in Medicare, hopeful that an SGLT2 will be an option for him when he gets insurance.

## 2019-02-04 NOTE — Telephone Encounter (Signed)
Would leave management of DM to primary care Olga Millers

## 2019-02-04 NOTE — Telephone Encounter (Signed)
Spoke with pt, Aware of dr crenshaw's recommendations.  °

## 2019-02-04 NOTE — Telephone Encounter (Signed)
New Message    PTs wife is calling because his PC wants him to start back on the Metformin medication  The pt and his wife want to make sure that it is okay for him to start this medication  Please call back

## 2019-02-05 ENCOUNTER — Telehealth: Payer: Self-pay | Admitting: Family Medicine

## 2019-02-05 DIAGNOSIS — E1165 Type 2 diabetes mellitus with hyperglycemia: Secondary | ICD-10-CM

## 2019-02-05 LAB — BASIC METABOLIC PANEL
BUN/Creatinine Ratio: 27 — ABNORMAL HIGH (ref 10–24)
BUN: 44 mg/dL — ABNORMAL HIGH (ref 8–27)
CO2: 21 mmol/L (ref 20–29)
CREATININE: 1.61 mg/dL — AB (ref 0.76–1.27)
Calcium: 9.7 mg/dL (ref 8.6–10.2)
Chloride: 106 mmol/L (ref 96–106)
GFR calc Af Amer: 51 mL/min/{1.73_m2} — ABNORMAL LOW (ref >59)
GFR calc non Af Amer: 45 mL/min/{1.73_m2} — ABNORMAL LOW (ref >59)
Glucose: 93 mg/dL (ref 65–99)
Potassium: 5.7 mmol/L — ABNORMAL HIGH (ref 3.5–5.2)
Sodium: 143 mmol/L (ref 134–144)

## 2019-02-05 MED ORDER — METFORMIN HCL 500 MG PO TABS: 1000.0000 mg | ORAL_TABLET | Freq: Two times a day (BID) | ORAL | 3 refills | Status: DC

## 2019-02-05 NOTE — Telephone Encounter (Signed)
GFR 45 on most recent BMP.  Per chart review the large majority of his GFR is over 45 or higher.  There is one reading at 40.  Technically no dose adjustment needed at this level.  Left message for patient that can restart metformin and to start back gradually at reduced dose before titrating back up to 1000 mg twice daily.

## 2019-02-09 ENCOUNTER — Other Ambulatory Visit: Payer: Self-pay | Admitting: Family Medicine

## 2019-02-09 DIAGNOSIS — G8929 Other chronic pain: Secondary | ICD-10-CM

## 2019-02-09 DIAGNOSIS — M545 Low back pain, unspecified: Secondary | ICD-10-CM

## 2019-02-11 NOTE — Telephone Encounter (Signed)
[place] PMP reviewed today, no red flags. Will need appointment at the end of this month for diabetes follow up and also for chronic pain follow up. Hopeful for discussion regarding further decrease in monthly supply at that time.

## 2019-02-22 ENCOUNTER — Other Ambulatory Visit: Payer: Self-pay | Admitting: Family Medicine

## 2019-02-22 DIAGNOSIS — Z8679 Personal history of other diseases of the circulatory system: Secondary | ICD-10-CM

## 2019-02-25 IMAGING — DX DG CHEST 1V PORT
1 series · 1 of 1 positions shown · non-contrast
Comparison: 08/23/2018

CLINICAL DATA: Follow-up chest tube

EXAM:
PORTABLE CHEST 1 VIEW

[chest ap]
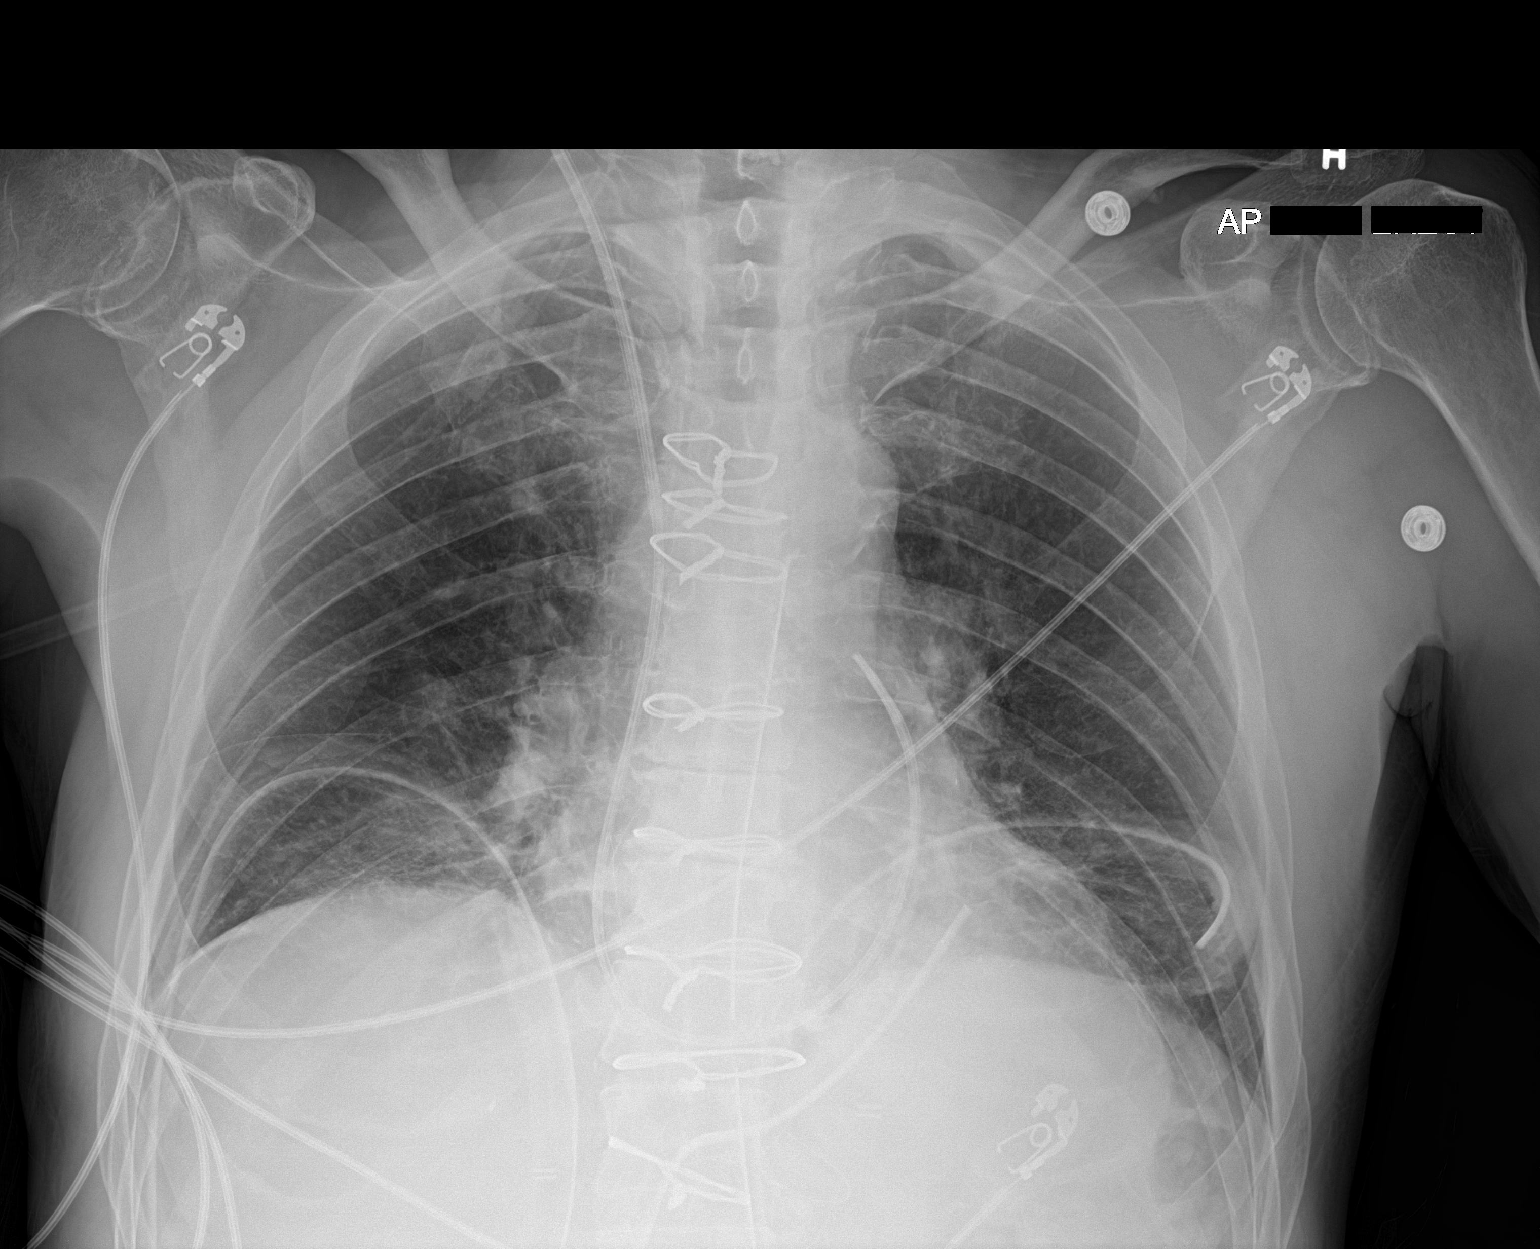

[1 of 1 positions shown; findings below may reference images not displayed]

FINDINGS: The endotracheal tube and nasogastric catheter have been removed in
the interval. Mediastinal drain and bilateral thoracostomy catheters
are again seen and stable. Pericardial drain is stable as well. Swan
Ganz catheter is been removed slightly now lies in the pulmonary
outflow tract. The lungs are hypoaerated compare with the prior exam
with mild bibasilar atelectasis. No pneumothorax is seen.
IMPRESSION: Tubes and lines as described.

New bibasilar atelectatic changes without evidence of pneumothorax.

## 2019-02-26 ENCOUNTER — Telehealth: Payer: Self-pay | Admitting: *Deleted

## 2019-02-26 DIAGNOSIS — IMO0002 Reserved for concepts with insufficient information to code with codable children: Secondary | ICD-10-CM

## 2019-02-26 DIAGNOSIS — E1165 Type 2 diabetes mellitus with hyperglycemia: Secondary | ICD-10-CM

## 2019-02-26 DIAGNOSIS — E1142 Type 2 diabetes mellitus with diabetic polyneuropathy: Secondary | ICD-10-CM

## 2019-02-26 DIAGNOSIS — Z794 Long term (current) use of insulin: Secondary | ICD-10-CM

## 2019-02-26 MED ORDER — INSULIN DEGLUDEC 100 UNIT/ML ~~LOC~~ SOPN: 14.0000 [IU] | PEN_INJECTOR | SUBCUTANEOUS | 0 refills | Status: DC

## 2019-02-26 NOTE — Telephone Encounter (Signed)
Pt is calling for samples of tresiba.  Placed in fridge with pts name and wife will pick up. Jone Baseman, CMA

## 2019-03-26 ENCOUNTER — Other Ambulatory Visit: Payer: Self-pay | Admitting: Family Medicine

## 2019-03-26 DIAGNOSIS — M545 Low back pain, unspecified: Secondary | ICD-10-CM

## 2019-03-26 DIAGNOSIS — G8929 Other chronic pain: Secondary | ICD-10-CM

## 2019-03-26 MED ORDER — HYDROCODONE-ACETAMINOPHEN 10-325 MG PO TABS: ORAL_TABLET | ORAL | 0 refills | Status: DC

## 2019-03-26 NOTE — Telephone Encounter (Signed)
[place] PMP reviewed today. No red flags

## 2019-03-26 NOTE — Telephone Encounter (Signed)
Pt informed of Rx being sent into pharm. Aquilla Solian, CMA

## 2019-03-26 NOTE — Telephone Encounter (Signed)
Pt's wife called for refill on his NORCO pain meds, Please give pt a call back on his cell at [phone].

## 2019-03-29 IMAGING — DX DG CHEST 2V
2 series · 2 of 2 positions shown · non-contrast
Comparison: Chest x-ray dated August 27, 2018.

CLINICAL DATA: Recent CABG.  Asymptomatic.

EXAM:
CHEST - 2 VIEW

[dg chest 2 view (1 of 2)]
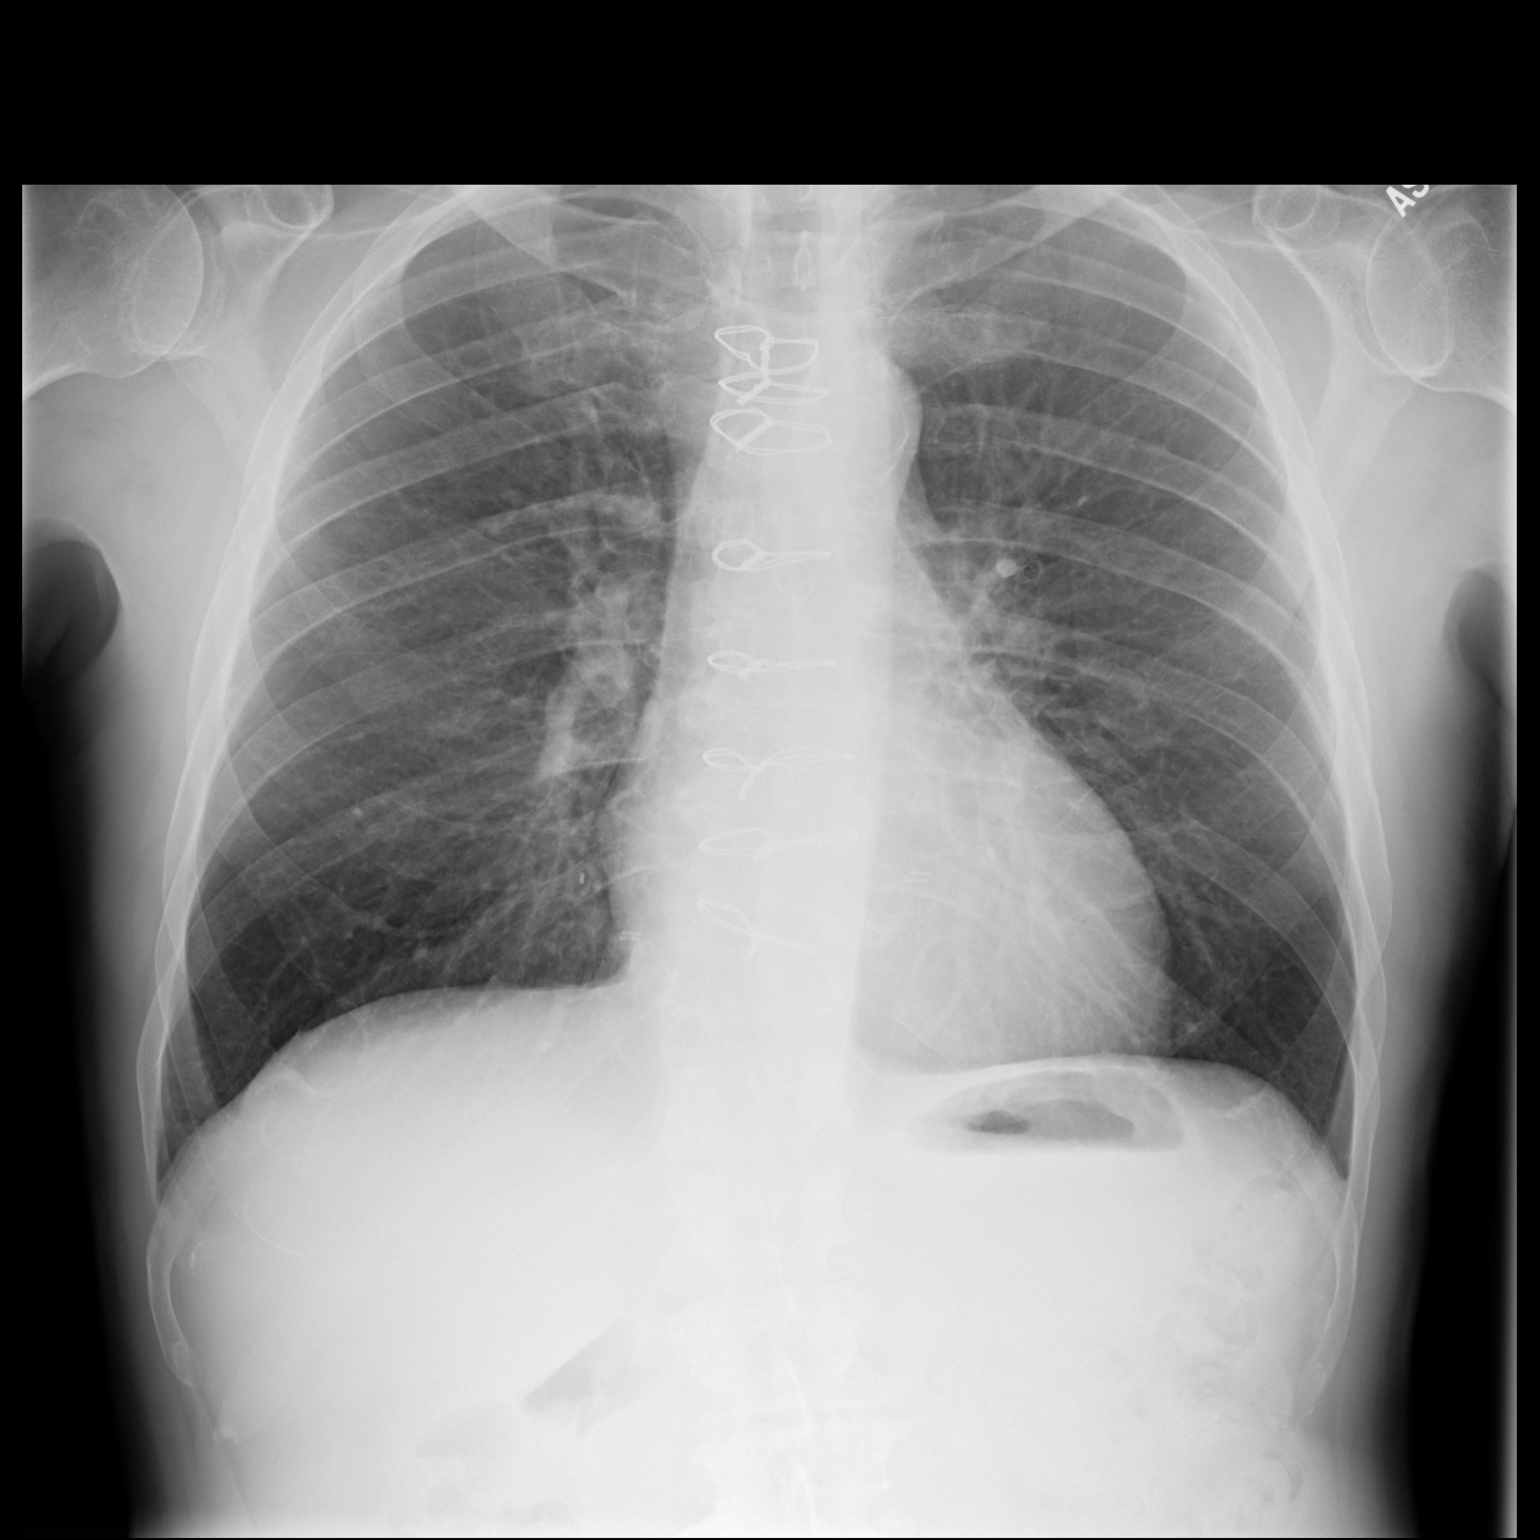

[dg chest 2 view (2 of 2)]
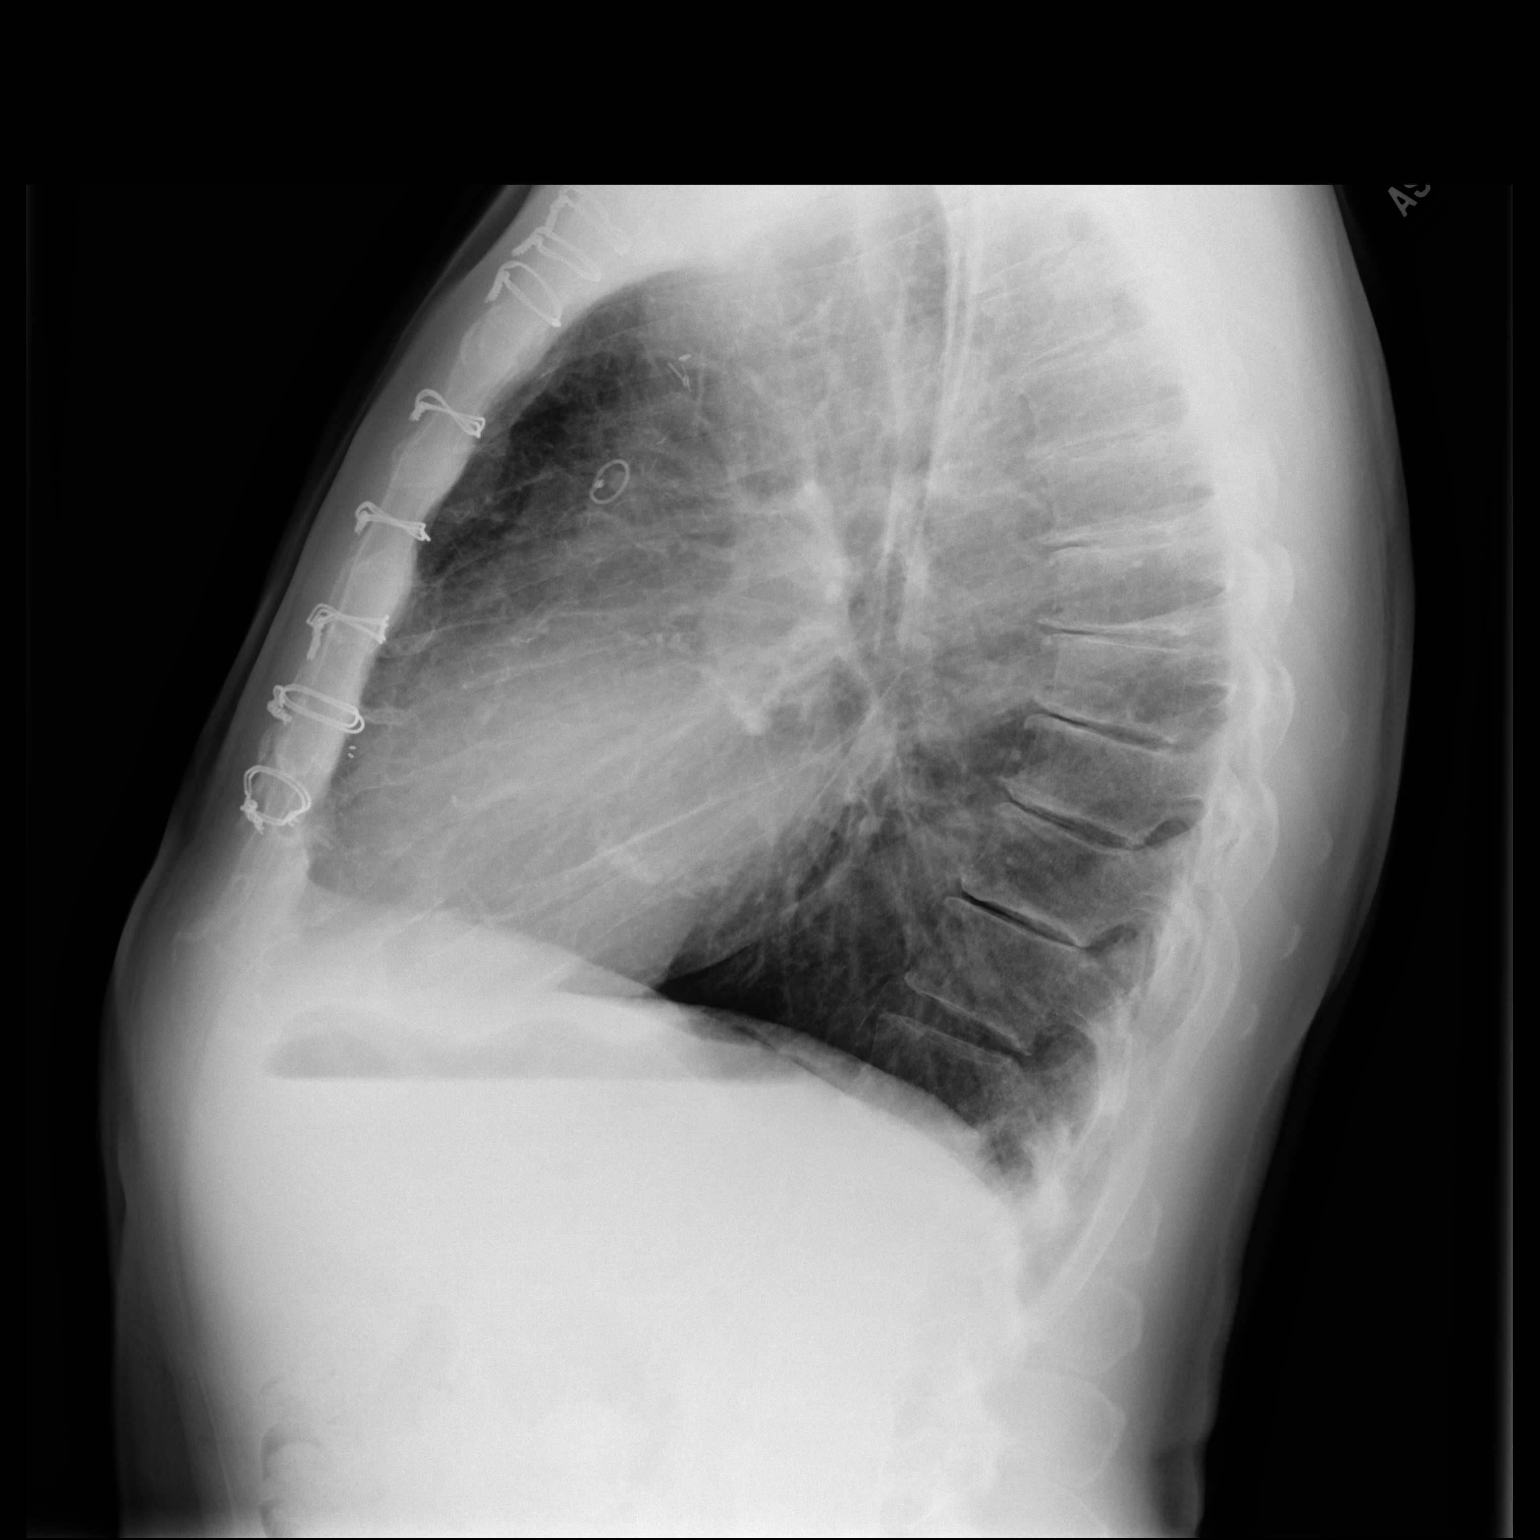

[2 of 2 positions shown; findings below may reference images not displayed]

FINDINGS: The heart size and mediastinal contours are within normal limits.
Prior CABG. Normal pulmonary vascularity. Atherosclerotic
calcification of the aortic arch. Previously seen small bilateral
pleural effusions have resolved. No consolidation or pneumothorax.
No acute osseous abnormality.
IMPRESSION: No active cardiopulmonary disease. Resolved small bilateral pleural
effusions.

## 2019-04-09 ENCOUNTER — Other Ambulatory Visit: Payer: Self-pay

## 2019-04-09 DIAGNOSIS — IMO0002 Reserved for concepts with insufficient information to code with codable children: Secondary | ICD-10-CM

## 2019-04-09 DIAGNOSIS — E1142 Type 2 diabetes mellitus with diabetic polyneuropathy: Secondary | ICD-10-CM

## 2019-04-09 DIAGNOSIS — E1165 Type 2 diabetes mellitus with hyperglycemia: Secondary | ICD-10-CM

## 2019-04-09 DIAGNOSIS — Z794 Long term (current) use of insulin: Secondary | ICD-10-CM

## 2019-04-09 IMAGING — DX DG CHEST 2V
2 series · 2 of 2 positions shown · non-contrast
Comparison: 09/25/2018.

CLINICAL DATA: Bilateral leg swelling. Shortness of breath for the
past 2 days. Ex-smoker. Status post CABG 1 month ago.

EXAM:
CHEST - 2 VIEW

[w chest pa]
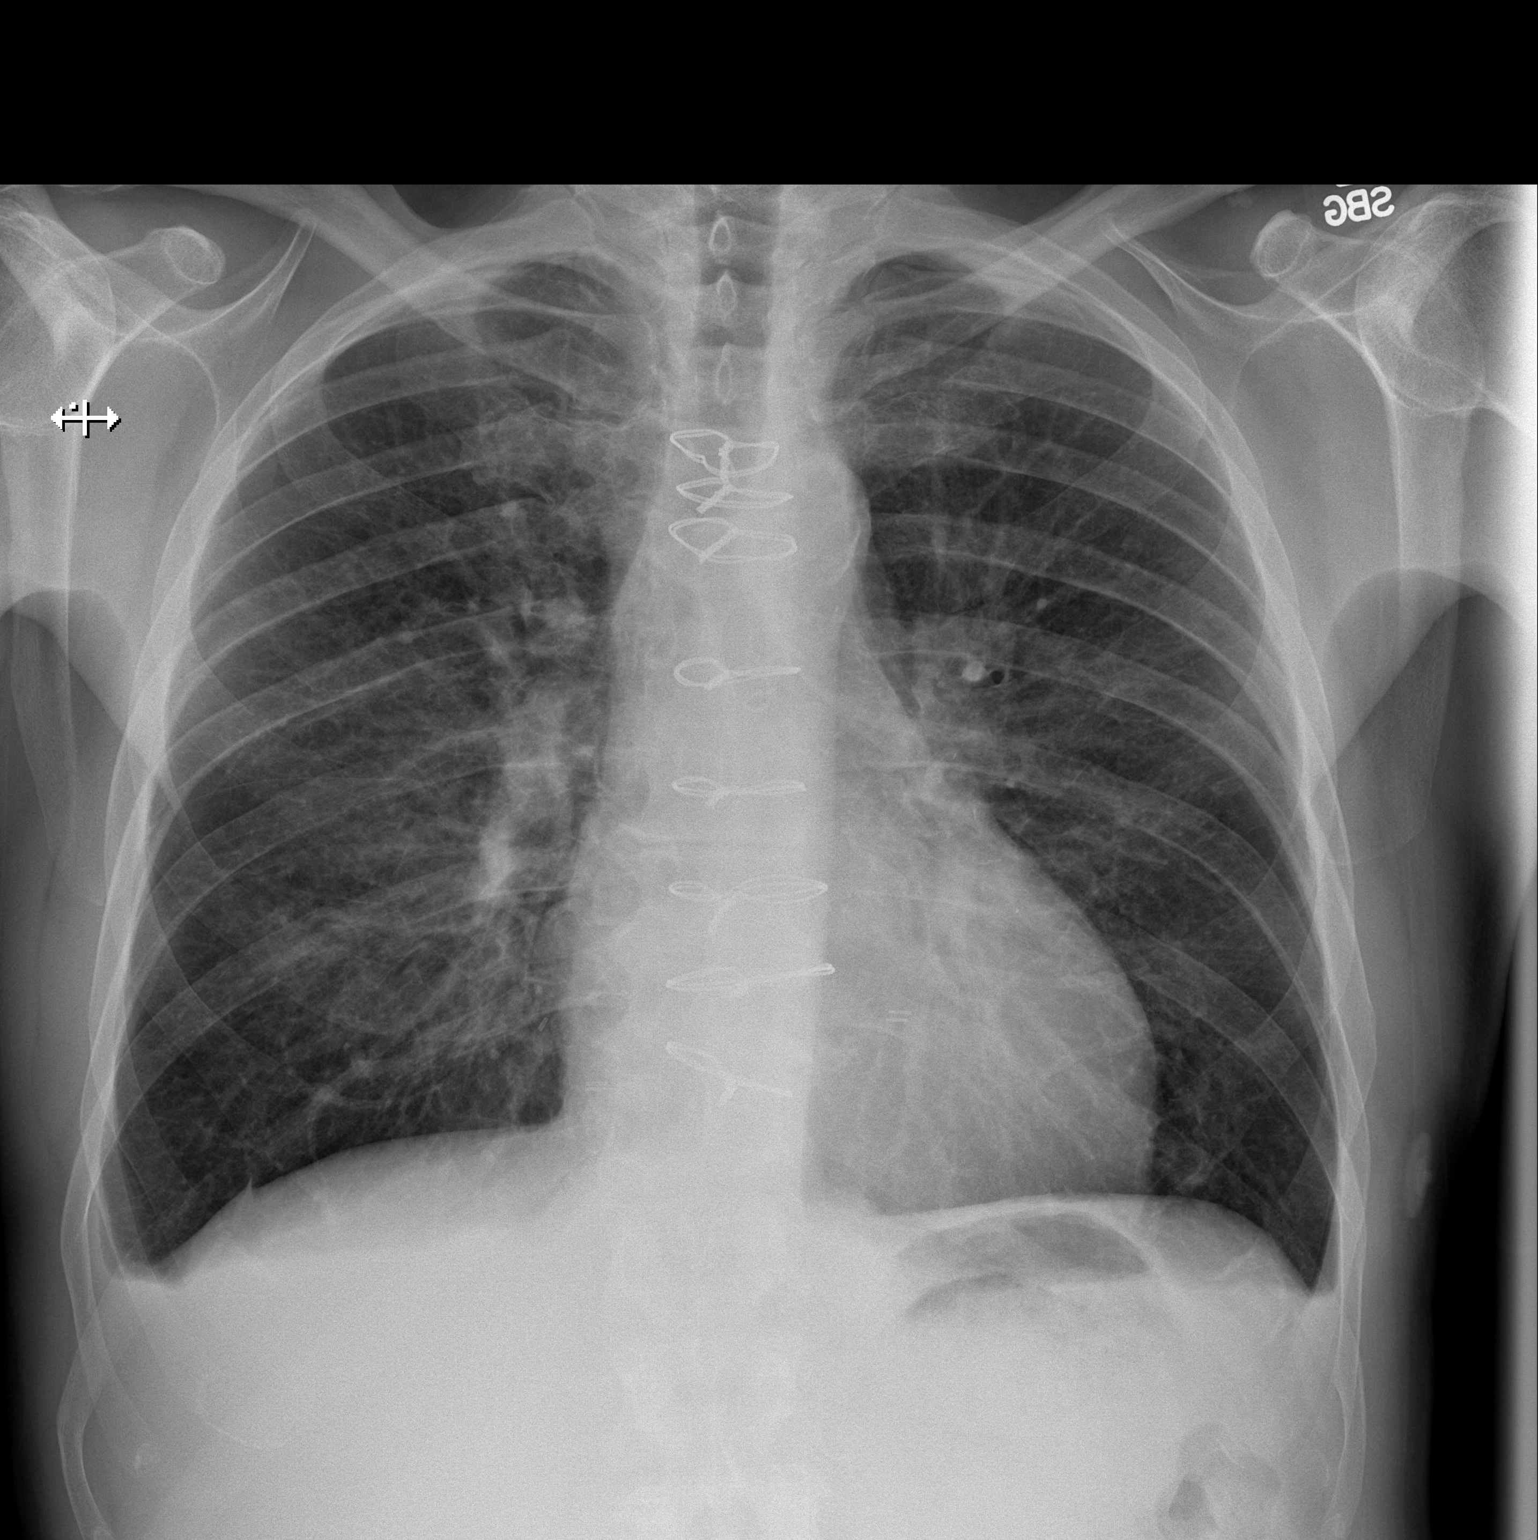

[w chest lat]
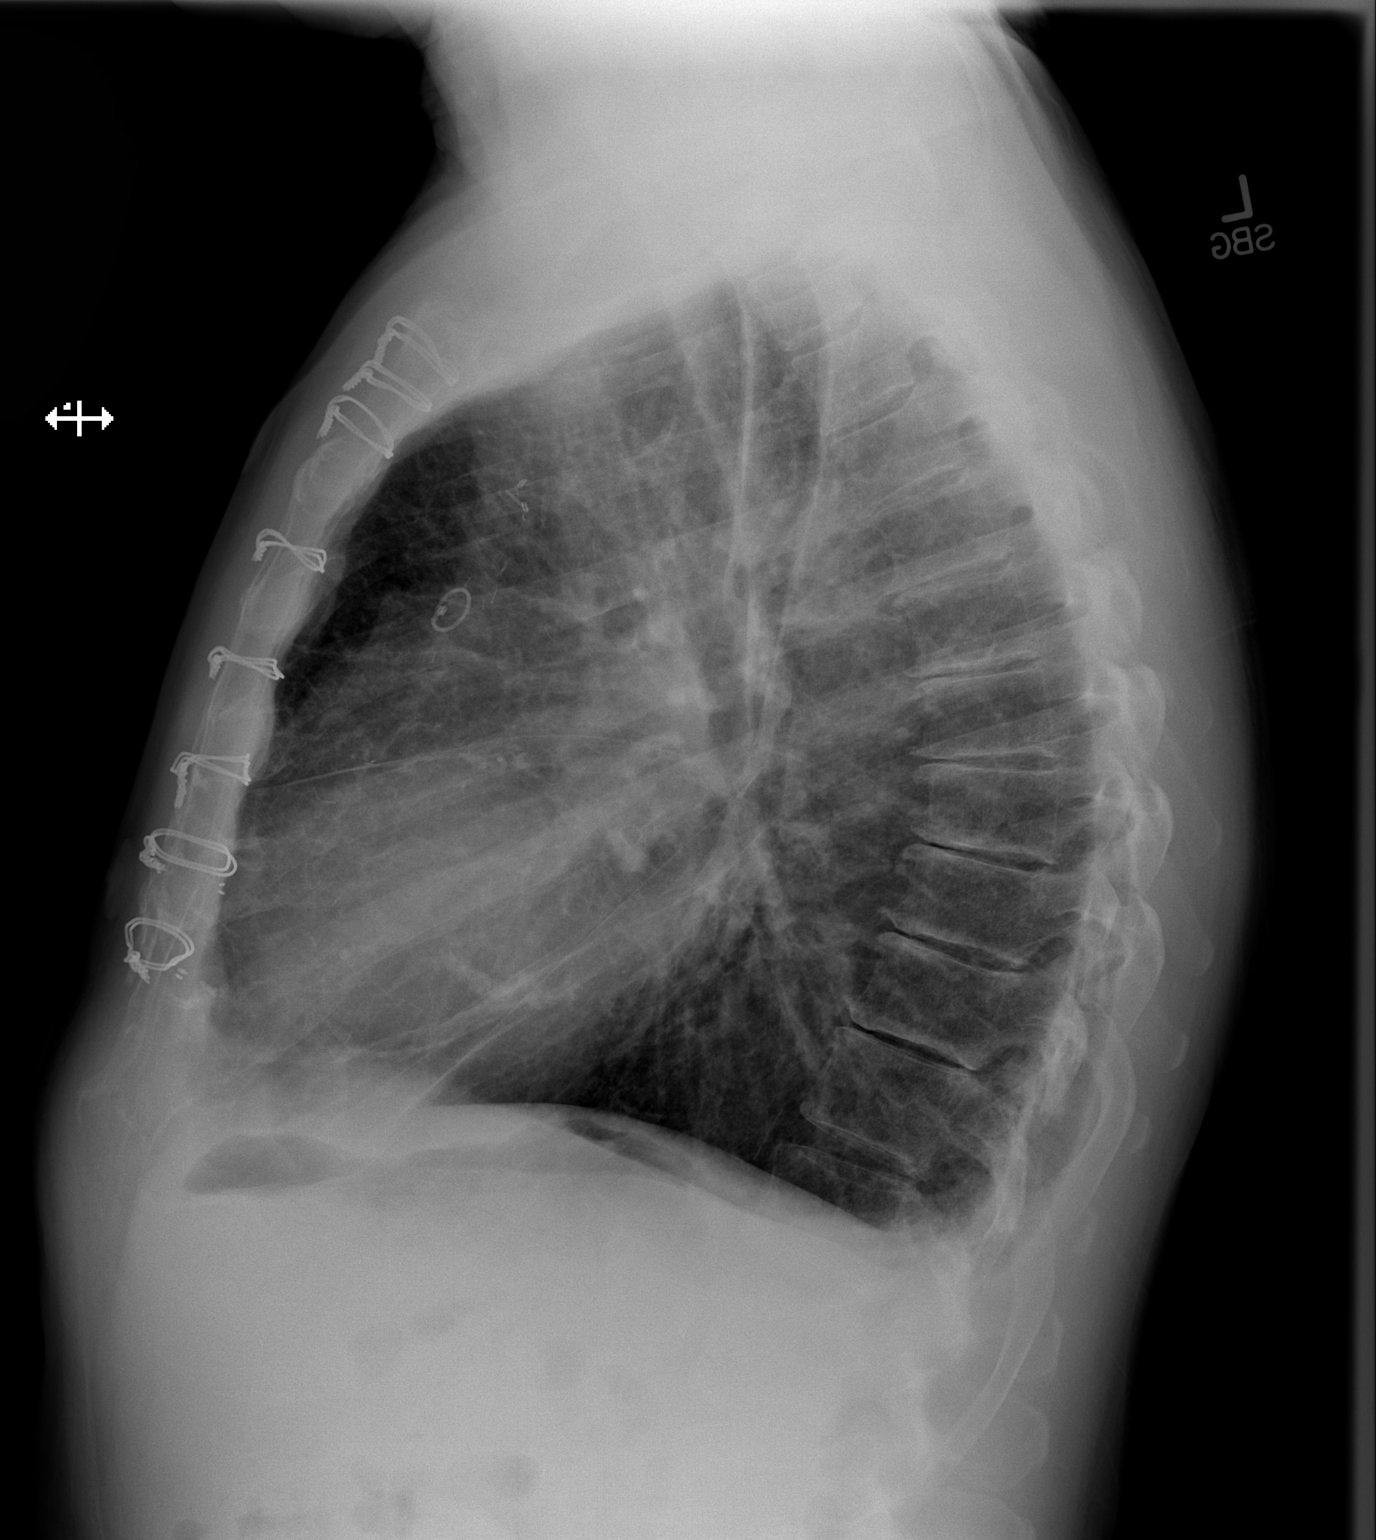

[2 of 2 positions shown; findings below may reference images not displayed]

FINDINGS: Normal sized heart. Stable mild diffuse peribronchial thickening.
Interval slight increase in prominence of the interstitial markings
with minimal Kerley lines. The lungs remain mildly hyperexpanded.
Interval small bilateral pleural effusions with minimal adjacent
posterior atelectasis. Thoracic spine degenerative changes. Post
CABG changes.
IMPRESSION: Interval minimal changes of congestive heart failure superimposed on
COPD and chronic bronchitic changes.

## 2019-04-09 NOTE — Telephone Encounter (Signed)
Pts wife called nurse line stating Colin Keller always gives [place] samples of insulin. Last given back in February. Ok to give if we have in stock? Novolog and [place].

## 2019-04-10 NOTE — Telephone Encounter (Signed)
NDC: 8206-0156-15 x 2 Evaristo Bury Flextouch [employer] EXP: 08/2020 Lot: [id number]  Information on samples given out.  Glennie Hawk, CMA

## 2019-04-10 NOTE — Telephone Encounter (Signed)
Agree with giving patient samples.

## 2019-04-10 NOTE — Telephone Encounter (Signed)
Pt called to see if his samples were ready for pick up. Please call once they are ready since he has to drive from [place] .

## 2019-04-10 NOTE — Telephone Encounter (Signed)
Called patient and LVM informing him that there would be two boxes of [place] waiting for him.  Glennie Hawk, CMA

## 2019-04-15 ENCOUNTER — Telehealth: Payer: Self-pay | Admitting: *Deleted

## 2019-04-15 NOTE — Telephone Encounter (Signed)
04/15/19 LMOM @ 0952,re: follow up appointment.

## 2019-04-16 ENCOUNTER — Telehealth: Payer: Self-pay | Admitting: *Deleted

## 2019-04-16 DIAGNOSIS — Z794 Long term (current) use of insulin: Secondary | ICD-10-CM

## 2019-04-16 DIAGNOSIS — E1165 Type 2 diabetes mellitus with hyperglycemia: Secondary | ICD-10-CM

## 2019-04-16 DIAGNOSIS — IMO0002 Reserved for concepts with insufficient information to code with codable children: Secondary | ICD-10-CM

## 2019-04-16 DIAGNOSIS — E1142 Type 2 diabetes mellitus with diabetic polyneuropathy: Secondary | ICD-10-CM

## 2019-04-16 MED ORDER — INSULIN ASPART 100 UNIT/ML FLEXPEN: PEN_INJECTOR | SUBCUTANEOUS | 0 refills | Status: DC

## 2019-04-16 NOTE — Telephone Encounter (Signed)
Pt called to see if we have received novolog flexpen samples yet.  @ set aside in fridge for him.Jone Baseman, CMA

## 2019-04-17 ENCOUNTER — Telehealth: Payer: Self-pay | Admitting: Cardiology

## 2019-04-17 NOTE — Telephone Encounter (Signed)
Mychart,smartphone, pre reg complete 04/17/19 AF °

## 2019-04-18 ENCOUNTER — Telehealth (INDEPENDENT_AMBULATORY_CARE_PROVIDER_SITE_OTHER): Payer: Self-pay | Admitting: Cardiology

## 2019-04-18 VITALS — Ht 70.0 in | Wt 160.0 lb

## 2019-04-18 DIAGNOSIS — I6523 Occlusion and stenosis of bilateral carotid arteries: Secondary | ICD-10-CM

## 2019-04-18 DIAGNOSIS — E7800 Pure hypercholesterolemia, unspecified: Secondary | ICD-10-CM

## 2019-04-18 DIAGNOSIS — I10 Essential (primary) hypertension: Secondary | ICD-10-CM

## 2019-04-18 DIAGNOSIS — I2510 Atherosclerotic heart disease of native coronary artery without angina pectoris: Secondary | ICD-10-CM

## 2019-04-18 NOTE — Patient Instructions (Signed)
Medication Instructions:  STOP PLAVIX IN SEPTEMBER If you need a refill on your cardiac medications before your next appointment, please call your pharmacy.   Lab work If you have labs (blood work) drawn today and your tests are completely normal, you will receive your results only by: Marland Kitchen MyChart Message (if you have MyChart) OR . A paper copy in the mail If you have any lab test that is abnormal or we need to change your treatment, we will call you to review the results.  Testing/Procedures: Your physician has requested that you have a carotid duplex. This test is an ultrasound of the carotid arteries in your neck. It looks at blood flow through these arteries that supply the brain with blood. Allow one hour for this exam. There are no restrictions or special instructions.  [facility]  Follow-Up: At [facility], you and your health needs are our priority.  As part of our continuing mission to provide you with exceptional heart care, we have created designated Provider Care Teams.  These Care Teams include your primary Cardiologist (physician) and Advanced Practice Providers (APPs -  Physician Assistants and Nurse Practitioners) who all work together to provide you with the care you need, when you need it. You will need a follow up appointment in 6 months.  Please call our office 2 months in advance to schedule this appointment.  You may see Olga Millers, MD or one of the following Advanced Practice Providers on your designated Care Team:   Corine Shelter, PA-C Judy Pimple, [place] . Marjie Skiff, PA-C

## 2019-04-18 NOTE — Progress Notes (Signed)
Virtual Visit via Video Note   This visit type was conducted due to national recommendations for restrictions regarding the COVID-19 Pandemic (e.g. social distancing) in an effort to limit this patient's exposure and mitigate transmission in our community.  Due to his co-morbid illnesses, this patient is at least at moderate risk for complications without adequate follow up.  This format is felt to be most appropriate for this patient at this time.  All issues noted in this document were discussed and addressed.  A limited physical exam was performed with this format.  Please refer to the patient's chart for his consent to telehealth for [facility].   Date:  04/18/2019   ID:  Colin Keller, DOB Dec 08, 1956, MRN [id number]  Patient Location: Home Provider Location: Home  PCP:  [name], DO  Cardiologist:  Olga Millers, MD   Evaluation Performed:  Follow-Up Visit  Chief Complaint:  CAD  History of Present Illness:    Follow-up coronary artery disease. Previously admitted to [facility] with non-ST elevation myocardial infarction. Echocardiogram September 2019 showed ejection fraction 50 to 55% and mild diastolic dysfunction. Cardiac catheterization September 2019 showed severe coronary disease and he ultimately had coronary artery bypass and graft with a LIMA to the LAD, RIMA to the right coronary artery and saphenous vein graft to the obtuse marginal. Note preoperative carotid Dopplers showed 40 to 59% bilateral stenosis. He has had problems with hyperkalemia postoperatively. Also with chronic stage III kidney disease. Admitted with CHF 10/19; echo showed EF 50, mild MR and biatrial enlargement. Pt treated with diuretics with improvement in symptoms. Since he was last seen  there is no dyspnea, chest pain, palpitations or syncope.  The patient does not have symptoms concerning for COVID-19 infection (fever, chills, cough, or new shortness of breath).    Past Medical  History:  Diagnosis Date  . Acute diastolic heart failure (HCC) 08/18/2018  . CHF (congestive heart failure) (HCC)   . CKD (chronic kidney disease), stage III (HCC)   . Coronary artery disease   . Diabetes mellitus   . Dyspnea   . GERD (gastroesophageal reflux disease)   . Hx of CABG 08/23/2018   LIMA to LAD, RIMA to RCA, SVG to OM3, EVH via right thigh  . Hyperlipidemia   . Hypertension   . 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 Tobacco abuse   . Tobacco abuse disorder    Qualifier: Diagnosis of  By: Lafonda Mosses MD, Darl Pikes     Past Surgical History:  Procedure Laterality Date  . CORONARY ARTERY BYPASS GRAFT N/A 08/23/2018   Procedure: CORONARY ARTERY BYPASS GRAFTING (CABG) x 3; -Left Internal Mammary Artery to Left Anterior Descending Artery, -Right Internal Mammary Artery to Right Coronary Artery, -Saphenous Vein Graft to Obtuse Marginal;  ENDOSCOPIC HARVEST GREATER SAPHENOUS VEIN  -Right Thigh;  Surgeon: Purcell Nails, MD;  Location: [facility] OR;  Service: Open Heart Surgery;  Laterality: N/A;  . INTRAVASCULAR PRESSURE WIRE/FFR STUDY N/A 08/20/2018   Procedure: INTRAVASCULAR PRESSURE WIRE/FFR STUDY;  Surgeon: Tonny Bollman, MD;  Location: [facility] INVASIVE CV LAB;  Service: Cardiovascular;  Laterality: N/A;  . LEFT HEART CATH AND CORONARY ANGIOGRAPHY N/A 08/20/2018   Procedure: LEFT HEART CATH AND CORONARY ANGIOGRAPHY;  Surgeon: Tonny Bollman, MD;  Location: [facility] INVASIVE CV LAB;  Service: Cardiovascular;  Laterality: N/A;  . TEE WITHOUT CARDIOVERSION N/A 08/23/2018   Procedure: TRANSESOPHAGEAL ECHOCARDIOGRAM (  TEE);  Surgeon: Purcell Nailswen, Clarence H, MD;  [facility];  Service: Open Heart Surgery;  Laterality: N/A;     Current Meds  Medication Sig  . acetaminophen (TYLENOL) 500 MG tablet Take 2 tablets (1,000 mg total) by mouth every 6 (six) hours as needed for mild pain. (Patient taking differently: Take 1,000 mg by mouth at bedtime. )  . aspirin 81 MG  tablet Take 81 mg by mouth daily.  . clopidogrel (PLAVIX) 75 MG tablet Take 1 tablet (75 mg total) by mouth daily.  . Evolocumab (REPATHA Hollister) Inject 1 Dose into the skin every 14 (fourteen) days. Injection every 2 weeks.   . Famotidine 20 MG CHEW Chew 20 mg by mouth daily as needed (for reflux).  . furosemide (LASIX) 40 MG tablet TAKE 1 TABLET BY MOUTH DAILY  . HYDROcodone-acetaminophen (NORCO) 10-325 MG tablet TAKE 1 TABLET BY MOUTH TWICE DAILY AS NEEDED FOR MODERATE OR SEVERE PAIN  . insulin aspart (NOVOLOG FLEXPEN) 100 UNIT/ML FlexPen Inject 10 - 12 units daily; 4-5 units BKFST, 4-7 units with dinner  . insulin degludec (TRESIBA FLEXTOUCH) 100 UNIT/ML SOPN FlexTouch Pen Inject 0.14-0.18 mLs (14-18 Units total) into the skin every morning.  . Menthol, Topical Analgesic, (BIOFREEZE ROLL-ON) 4 % GEL Apply 1 application topically as needed (shoulder pain).  . metFORMIN (GLUCOPHAGE) 500 MG tablet Take 2 tablets (1,000 mg total) by mouth 2 (two) times daily with a meal.  . metoprolol tartrate (LOPRESSOR) 50 MG tablet Take 1 tablet (50 mg total) by mouth 2 (two) times daily.  . [DISCONTINUED] pravastatin (PRAVACHOL) 20 MG tablet Take 1 tablet (20 mg total) by mouth daily.     Allergies:   Amlodipine; Amitriptyline; Carvedilol; Cyclobenzaprine; Nortriptyline hcl; and Simvastatin   Social History   Tobacco Use  . Smoking status: Former Smoker    Packs/day: 0.50    Years: 45.00    Pack years: 22.50    Types: Cigarettes    Start date: 08/12/1970    Last attempt to quit: 08/12/2018    Years since quitting: 0.6  . Smokeless tobacco: Never Used  . Tobacco comment: Quit for 4.5 months in August 2016  Substance Use Topics  . Alcohol use: No    Alcohol/week: 0.0 standard drinks  . Drug use: No     Family Hx: The patient's family history includes Diabetes in his mother; Heart disease in his father and mother; Heart failure in his mother; Sudden death in his brother.  ROS:   Please see the history  of present illness.    No fevers, chills or productive cough. All other systems reviewed and are negative.  Recent Labs: 08/18/2018: TSH 0.333 08/24/2018: Magnesium 2.7 10/06/2018: B Natriuretic Peptide 1,422.5 10/08/2018: Hemoglobin 10.3; Platelets 322 12/13/2018: ALT 12 02/04/2019: BUN 44; Creatinine, Ser 1.61; Potassium 5.7; Sodium 143   Recent Lipid Panel Lab Results  Component Value Date/Time   CHOL 153 12/13/2018 08:37 AM   TRIG 124 12/13/2018 08:37 AM   HDL 61 12/13/2018 08:37 AM   CHOLHDL 3.0 08/19/2018 06:03 AM   LDLCALC 67 12/13/2018 08:37 AM   LDLDIRECT 105 09/19/2016 09:22 AM    Wt Readings from Last 3 Encounters:  04/18/19 160 lb (72.6 kg)  02/04/19 170 lb (77.1 kg)  12/31/18 173 lb 6.4 oz (78.7 kg)     Objective:    Vital Signs:  Ht 5\' 10"  (1.778 m)   Wt 160 lb (72.6 kg)   BMI 22.96 kg/m    VITAL SIGNS:  reviewed  No acute distress Normal affect Answers questions appropriately Remainder of physical examination not performed (telehealth visit; coronavirus pandemic)  ASSESSMENT & PLAN:    1. Coronary artery disease-patient is status post coronary artery bypass and graft.  He is doing well from a symptomatic standpoint.  Continue medical therapy with aspirin and statin.  Discontinue Plavix September 2020. 2. Hypertension-blood pressure is controlled.  Continue present medications and follow. 3. Hyperlipidemia-continue Repatha. 4. Tobacco abuse patient has discontinued. 5. Carotid artery disease-follow-up carotid Doppler September 2020. 6. Chronic combined systolic/diastolic congestive heart failure-patient doing well from a symptomatic standpoint.  Continue present dose of diuretic.  Continue fluid restriction and low-sodium diet. 7. Chronic stage III kidney disease-followed by primary care.  COVID-19 Education: The importance of social distancing was discussed today.  Time:   Today, I have spent 15 minutes with the patient with telehealth technology  discussing the above problems.     Medication Adjustments/Labs and Tests Ordered: Current medicines are reviewed at length with the patient today.  Concerns regarding medicines are outlined above.   Tests Ordered: No orders of the defined types were placed in this encounter.   Medication Changes: No orders of the defined types were placed in this encounter.   Disposition:  Follow up in 6 month(s)  Signed, Olga Millers, MD  04/18/2019 3:45 PM     [facility]

## 2019-04-19 ENCOUNTER — Other Ambulatory Visit: Payer: Self-pay | Admitting: Family Medicine

## 2019-04-22 ENCOUNTER — Other Ambulatory Visit: Payer: Self-pay | Admitting: Family Medicine

## 2019-04-22 DIAGNOSIS — M545 Low back pain, unspecified: Secondary | ICD-10-CM

## 2019-04-22 DIAGNOSIS — G8929 Other chronic pain: Secondary | ICD-10-CM

## 2019-04-22 MED ORDER — HYDROCODONE-ACETAMINOPHEN 10-325 MG PO TABS: ORAL_TABLET | ORAL | 0 refills | Status: DC

## 2019-04-22 NOTE — Telephone Encounter (Signed)
Pt's wife called about a refill on his NORCO. Please give pt's wife a call back.

## 2019-04-22 NOTE — Telephone Encounter (Signed)
[place] PMP reviewed. No red flags. 

## 2019-04-30 ENCOUNTER — Telehealth: Payer: Self-pay | Admitting: *Deleted

## 2019-04-30 DIAGNOSIS — IMO0002 Reserved for concepts with insufficient information to code with codable children: Secondary | ICD-10-CM

## 2019-04-30 DIAGNOSIS — E1142 Type 2 diabetes mellitus with diabetic polyneuropathy: Secondary | ICD-10-CM

## 2019-04-30 DIAGNOSIS — E1165 Type 2 diabetes mellitus with hyperglycemia: Secondary | ICD-10-CM

## 2019-04-30 MED ORDER — INSULIN DEGLUDEC 100 UNIT/ML ~~LOC~~ SOPN: 14.0000 [IU] | PEN_INJECTOR | SUBCUTANEOUS | 0 refills | Status: DC

## 2019-04-30 NOTE — Telephone Encounter (Signed)
Pt is calling for samples of tresiba.  4 given. Jone Baseman, CMA

## 2019-05-07 ENCOUNTER — Other Ambulatory Visit: Payer: Self-pay | Admitting: Cardiology

## 2019-05-07 ENCOUNTER — Other Ambulatory Visit: Payer: Self-pay | Admitting: Thoracic Surgery (Cardiothoracic Vascular Surgery)

## 2019-05-20 ENCOUNTER — Telehealth: Payer: Self-pay

## 2019-05-20 NOTE — Telephone Encounter (Signed)
2 samples would be good. Thank you.

## 2019-05-20 NOTE — Telephone Encounter (Signed)
Patients wife calls nurse line requesting samples again of Novolog vials. Ok to provide? We do have some. Not sure how many to give him if appropriate.

## 2019-05-21 ENCOUNTER — Other Ambulatory Visit: Payer: Self-pay | Admitting: Family Medicine

## 2019-05-21 DIAGNOSIS — I5033 Acute on chronic diastolic (congestive) heart failure: Secondary | ICD-10-CM

## 2019-05-21 NOTE — Telephone Encounter (Signed)
Patient contacted and he will be here this afternoon for his samples.

## 2019-05-23 NOTE — Telephone Encounter (Signed)
Patient picked up two samples of Novolog 100 units vials.  Lot: [id number] EX: 06/10/2020

## 2019-05-27 ENCOUNTER — Other Ambulatory Visit: Payer: Self-pay | Admitting: Family Medicine

## 2019-05-27 DIAGNOSIS — M545 Low back pain, unspecified: Secondary | ICD-10-CM

## 2019-05-27 DIAGNOSIS — G8929 Other chronic pain: Secondary | ICD-10-CM

## 2019-05-27 NOTE — Telephone Encounter (Signed)
Pt wife is calling to request a refill for pt's Hydrocodone.

## 2019-05-28 MED ORDER — HYDROCODONE-ACETAMINOPHEN 10-325 MG PO TABS: ORAL_TABLET | ORAL | 0 refills | Status: DC

## 2019-05-28 NOTE — Telephone Encounter (Signed)
Will give 30 day supply. He will need either in person or telemedicine visit next month to meet new PCP for future refills. [place] PMP reviewed today. No red flags.

## 2019-05-28 NOTE — Telephone Encounter (Signed)
appt made with dr. Shawna Orleans for 06-06-2019, patient wanted to see her one last time before she left.

## 2019-06-06 ENCOUNTER — Ambulatory Visit (INDEPENDENT_AMBULATORY_CARE_PROVIDER_SITE_OTHER): Payer: Self-pay | Admitting: Family Medicine

## 2019-06-06 ENCOUNTER — Encounter: Payer: Self-pay | Admitting: Family Medicine

## 2019-06-06 ENCOUNTER — Other Ambulatory Visit: Payer: Self-pay

## 2019-06-06 VITALS — BP 143/60 | HR 69

## 2019-06-06 DIAGNOSIS — E1165 Type 2 diabetes mellitus with hyperglycemia: Secondary | ICD-10-CM

## 2019-06-06 DIAGNOSIS — Z794 Long term (current) use of insulin: Secondary | ICD-10-CM

## 2019-06-06 DIAGNOSIS — E1142 Type 2 diabetes mellitus with diabetic polyneuropathy: Secondary | ICD-10-CM

## 2019-06-06 DIAGNOSIS — IMO0002 Reserved for concepts with insufficient information to code with codable children: Secondary | ICD-10-CM

## 2019-06-06 LAB — POCT GLYCOSYLATED HEMOGLOBIN (HGB A1C): HbA1c, POC (controlled diabetic range): 11 % — AB (ref 0.0–7.0)

## 2019-06-06 MED ORDER — NOVOLOG FLEXPEN 100 UNIT/ML ~~LOC~~ SOPN: 4.0000 [IU] | PEN_INJECTOR | Freq: Three times a day (TID) | SUBCUTANEOUS | 0 refills | Status: DC

## 2019-06-06 MED ORDER — TRESIBA FLEXTOUCH 100 UNIT/ML ~~LOC~~ SOPN: 14.0000 [IU] | PEN_INJECTOR | SUBCUTANEOUS | 0 refills | Status: DC

## 2019-06-06 NOTE — Patient Instructions (Addendum)
Tresiba 14U daily  Novolog 4-6 U with meals   Follow up with Dr. Valentina Lucks in 2 weeks.

## 2019-06-06 NOTE — Progress Notes (Signed)
Subjective:  Colin KaufmanDavid W Keller is a 63 y.o. male who [facility] for diabetes follow up  HPI:  Patient is here for diabetes follow-up.  He has been getting his insulin samples here.  He states that he has been trying to stretch his insulin because we had temporarily been out of [place] one time and he was concerned about that and also because he does feel like sometimes he might be taking medications that others might need more than he does. He is states that he has been compliant on his Evaristo Buryresiba he is now taking 12 units daily.  He is also taking NovoLog 5 to 7 units with meals.  He has noticed that his morning fastings tend to be a little more elevated into the mid 200s. His evenings tend to be a bit more well controlled generally under 200.  He thinks that this is because he generally eats a lighter lunch. Since decreasing his insulin he has noticed far less hypoglycemic events. He is undergoing the Medicare enrollment process which he is hopeful about being able to get better access to prescription drugs as a result. He continues to have peripheral neuropathy as well as cramping in his feet.  He follows with cardiology and his history of CABG.  ROS: Per HPI  CC, SH/smoking status, and VS noted  Objective:  Physical Exam: BP (!) 143/60   Pulse 69   SpO2 98%   Gen: NAD, resting comfortably Pulm: NWOB Neuro: grossly normal, moves all extremities Psych: Normal affect and thought content  Results for orders placed or performed in visit on 06/06/19 (from the past 72 hour(s))  POCT glycosylated hemoglobin (Hb A1C)     Status: Abnormal   Collection Time: 06/06/19  9:04 AM  Result Value Ref Range   Hemoglobin A1C     HbA1c POC (<> result, manual entry)     HbA1c, POC (prediabetic range)     HbA1c, POC (controlled diabetic range) 11.0 (A) 0.0 - 7.0 %     Assessment/Plan:  DM (diabetes mellitus), type 2, uncontrolled (HCC) Worsening.  Likely in the setting of self  decreasing his insulin.  Increased Tresiba back to 14 units daily.  Discussed that we may continue to titrate upward based on his morning fastings.  Decrease NovoLog to 4 to 6 units with meals given that his post prandial evening readings are lower and he is afraid of hypoglycemic events in the setting of lighter afternoon meals.  Patient scheduled for a two-week follow-up with Dr. Raymondo BandKoval via telemedicine for further insulin titration.  He will soon be enrolled in Medicare.   Diabetic neuropathy Stable.  Unfortunately because patient is self-pay he is not currently a candidate for Lyrica.  Perhaps when he gets Medicare coverage this may be an option for him.    Orders Placed This Encounter  Procedures  . POCT glycosylated hemoglobin (Hb A1C)    Meds ordered this encounter  Medications  . insulin degludec (TRESIBA FLEXTOUCH) 100 UNIT/ML SOPN FlexTouch Pen    Sig: Inject 0.14 mLs (14 Units total) into the skin every morning.    Dispense:  4 pen    Refill:  0    Order Specific Question:   Lot Number?    Answer:   [id number]    Order Specific Question:   Expiration Date?    Answer:   09/10/2020    Order Specific Question:   Quantity    Answer:   4  .  insulin aspart (NOVOLOG FLEXPEN) 100 UNIT/ML FlexPen    Sig: Inject 4-6 Units into the skin 3 (three) times daily with meals.    Dispense:  2 pen    Refill:  0    Order Specific Question:   Lot Number?    Answer:   [id number]    Order Specific Question:   Expiration Date?    Answer:   03/11/2020  .  Bufford Lope, DO PGY-3, [facility] 06/06/2019 9:11 AM

## 2019-06-06 NOTE — Assessment & Plan Note (Signed)
Worsening.  Likely in the setting of self decreasing his insulin.  Increased Tresiba back to 14 units daily.  Discussed that we may continue to titrate upward based on his morning fastings.  Decrease NovoLog to 4 to 6 units with meals given that his post prandial evening readings are lower and he is afraid of hypoglycemic events in the setting of lighter afternoon meals.  Patient scheduled for a two-week follow-up with Dr. Valentina Lucks via telemedicine for further insulin titration.  He will soon be enrolled in Medicare.

## 2019-06-06 NOTE — Assessment & Plan Note (Signed)
Stable.  Unfortunately because patient is self-pay he is not currently a candidate for Lyrica.  Perhaps when he gets Medicare coverage this may be an option for him.

## 2019-06-20 ENCOUNTER — Telehealth (INDEPENDENT_AMBULATORY_CARE_PROVIDER_SITE_OTHER): Payer: Self-pay | Admitting: Pharmacist

## 2019-06-20 ENCOUNTER — Other Ambulatory Visit: Payer: Self-pay

## 2019-06-20 DIAGNOSIS — E785 Hyperlipidemia, unspecified: Secondary | ICD-10-CM

## 2019-06-20 DIAGNOSIS — E1165 Type 2 diabetes mellitus with hyperglycemia: Secondary | ICD-10-CM

## 2019-06-20 DIAGNOSIS — IMO0002 Reserved for concepts with insufficient information to code with codable children: Secondary | ICD-10-CM

## 2019-06-20 NOTE — Patient Instructions (Addendum)
-  Increased dose of basal insulin Tresiba (insulin degludec) from 14 to 16 units each morning.   -Continued  rapid insulin Novolog (insulin aspart) at 5-8 units prior to meals with sliding scale.

## 2019-06-20 NOTE — Assessment & Plan Note (Signed)
Diabetes longstanding and highly variable control despite use of basal bolus multi-shot regimen with sliding scale. Current control is fair based on reported home readings.  Patient is able to verbalize appropriate hypoglycemia management plan. Patient reports adherence with medication.  Upcoming 63th birthday in ~ 1 month will assist with medication access.  Patient is working through [employer] part D selection/applicaton process.   -Increased dose of basal insulin Tresiba (insulin degludec) from 14 to 16 units each morning.   -Continued  rapid insulin Novolog (insulin aspart) at 5-8 units prior to meals with sliding scale.   -Next A1C anticipated 1-2 months.

## 2019-06-20 NOTE — Progress Notes (Signed)
    S:     Diabetes Medication Management  Patient contact via telephone for DM follow up.  Patient has been seen for several diabetes visits in pharmacy clinic over the past several years.  Patient was referred and last seen by Primary Care Provider , Dr. Shawna Orleans following her final June face to face visit on 06/06/2019.  Insurance coverage/medication affordability is about to change with his 63th birthday and availability of Medicare Part D.  He has NOT yet chosen a Part D insurance provider.   Patient reports adherence with medications.  Current diabetes medications include: Novolog 5-8 prior to meals and Tresiba (14 units) QAM.  Patient denies hypoglycemic events.   Current hyperlipidemia medications include: Evolocumab (Repatha)    Patient-reported exercise habits are back to his "PRE-CABG" work routine most days.  He expressed happiness with being busy working.    .  Patient reports neuropathy and AM osteoarthritic pain.  Patient reports visual changes which he believes are likely cataracts.       O:    Lab Results  Component Value Date   HGBA1C 11.0 (A) 06/06/2019   There were no vitals filed for this visit.  Lipid Panel     Component Value Date/Time   CHOL 153 12/13/2018 0837   TRIG 124 12/13/2018 0837   HDL 61 12/13/2018 0837   CHOLHDL 3.0 08/19/2018 0603   VLDL 14 08/19/2018 0603   LDLCALC 67 12/13/2018 0837   LDLDIRECT 105 09/19/2016 0922    Home fasting CBG: 78-low 300s with majority of fasting readings = low 200s 2 hour post-prandial/random CBG: 150-300 with variability of 100s- low200s.   Clinical ASCVD: Yes   History of CABG     A/P: Diabetes longstanding and highly variable control despite use of basal bolus multi-shot regimen with sliding scale. Current control is fair based on reported home readings.  Patient is able to verbalize appropriate hypoglycemia management plan. Patient reports adherence with medication.  Upcoming 63th birthday in ~ 1  month will assist with medication access.  Patient is working through [employer] part D selection/applicaton process.   -Increased dose of basal insulin Tresiba (insulin degludec) from 14 to 16 units each morning.   -Continued  rapid insulin Novolog (insulin aspart) at 5-8 units prior to meals with sliding scale.   -Next A1C anticipated 1-2 months.  ASCVD risk secondary prevention in patient with DM and history of CABG. Last LDL controlled at 67 on evolocumab.   - Continue evolocumab.  -Continued aspirin 81 mg and clopidogrel.    Written patient instructions provided.  Total time in telephone contact.

## 2019-06-20 NOTE — Assessment & Plan Note (Signed)
ASCVD risk secondary prevention in patient with DM and history of CABG. Last LDL controlled at 67 on evolocumab.   - Continue evolocumab.  -Continued aspirin 81 mg and clopidogrel.

## 2019-07-01 ENCOUNTER — Other Ambulatory Visit: Payer: Self-pay

## 2019-07-01 ENCOUNTER — Telehealth: Payer: Self-pay | Admitting: Family Medicine

## 2019-07-01 DIAGNOSIS — G8929 Other chronic pain: Secondary | ICD-10-CM

## 2019-07-01 DIAGNOSIS — M545 Low back pain, unspecified: Secondary | ICD-10-CM

## 2019-07-01 NOTE — Telephone Encounter (Signed)
Pt wife is calling and would like for pt's norco to be refilled

## 2019-07-02 ENCOUNTER — Other Ambulatory Visit: Payer: Self-pay | Admitting: Family Medicine

## 2019-07-02 ENCOUNTER — Other Ambulatory Visit: Payer: Self-pay

## 2019-07-02 DIAGNOSIS — G8929 Other chronic pain: Secondary | ICD-10-CM

## 2019-07-02 DIAGNOSIS — M545 Low back pain, unspecified: Secondary | ICD-10-CM

## 2019-07-02 NOTE — Telephone Encounter (Signed)
Wife wants to let Dr. Gwyndolyn Kaufman that pt is completely out of medication and needs this. Pt has an appt with Dr. On Friday.  Colin Keller, CMA

## 2019-07-02 NOTE — Telephone Encounter (Signed)
Call and spoke with pt wife

## 2019-07-02 NOTE — Telephone Encounter (Signed)
Pt's wife called about a refill on pt's [place]. Please give pt's wife a call back.

## 2019-07-05 ENCOUNTER — Encounter: Payer: Self-pay | Admitting: Family Medicine

## 2019-07-05 ENCOUNTER — Other Ambulatory Visit: Payer: Self-pay

## 2019-07-05 ENCOUNTER — Ambulatory Visit (INDEPENDENT_AMBULATORY_CARE_PROVIDER_SITE_OTHER): Payer: Self-pay | Admitting: Family Medicine

## 2019-07-05 VITALS — BP 110/58 | HR 61 | Temp 98.7°F | Wt 152.0 lb

## 2019-07-05 DIAGNOSIS — I10 Essential (primary) hypertension: Secondary | ICD-10-CM

## 2019-07-05 DIAGNOSIS — M545 Low back pain, unspecified: Secondary | ICD-10-CM

## 2019-07-05 DIAGNOSIS — N183 Chronic kidney disease, stage 3 unspecified: Secondary | ICD-10-CM

## 2019-07-05 DIAGNOSIS — G8929 Other chronic pain: Secondary | ICD-10-CM

## 2019-07-05 MED ORDER — HYDROCODONE-ACETAMINOPHEN 10-325 MG PO TABS: ORAL_TABLET | ORAL | 0 refills | Status: DC

## 2019-07-05 NOTE — Patient Instructions (Addendum)
It was a pleasure to meet you today,  Follow up in 4-6 weeks Monitor blood sugars Monitor Blood pressure

## 2019-07-05 NOTE — Assessment & Plan Note (Signed)
BMet Follow up in 4-6 weeks

## 2019-07-05 NOTE — Assessment & Plan Note (Signed)
Well controlled continue current medications  

## 2019-07-05 NOTE — Assessment & Plan Note (Signed)
Follow up with ophthalmology

## 2019-07-05 NOTE — Assessment & Plan Note (Addendum)
Pain well controlled with Norco UDS Med refill Norco 10/325 mg 1 tab BID x30 tabs  Follow up in 4-6 weeks

## 2019-07-05 NOTE — Progress Notes (Signed)
  Patient Name: Colin Keller Date of Birth: [birth year] Date of Visit: 07/05/19 PCP: Carollee Leitz, MD  Chief Complaint: meet my new doctor and medication refill  Subjective: Colin Keller is a pleasant 63 y.o. with medical history significant for Chronic Lower Back Pain, HTN, DM Type II, presenting today for medication refill. He reports that his pain is well controlled with Norco 1 tab twice daily. He describes the pain as constant in his lower back and radiates to his legs bilaterally at times.  He reports numbness and tingling that he attributes to his diabetic neuropathy.  He has tried Gabapentin for this but it did not help. He is self employed as a Curator and he is still able to function daily despite the pain.  He also talks about looking into surgery once his insurance takes effect in a few months.  He reports having blurry vision.  He states that he cannot read the small letters on the wall poster.  Last eye exam was a few years ago and had surgery on both eyes for diabetic retinopathy.  He thinks the blurred vision is the beginning of cataracts.  He states that he plans to get an eye exam soon.  He reports that his glucose levels are not well controlled and his last Hb A1C was in double digits.  He is working with Dr Valentina Lucks on his insulin schedule for better blood control.    ROS: Per HPI.   I have reviewed the patient's medical, surgical, family, and social history as appropriate.  Vitals:   07/05/19 1533  BP: (!) 110/58  Pulse: 61  Temp: 98.7 F (37.1 C)  SpO2: 98%    General: Alert and oriented, no apparent distress  Eyes: PEERLA, no opaqueness Cardiovascular: RRR with no murmurs noted Respiratory: CTA bilaterally  Gastrointestinal: Bowel sounds present. No abdominal pain MSK: Upper extremity strength 5/5 bilaterally, Lower extremity strength 5/5 bilaterally, no pain on palpation  Behavior and speech appropriate to situation  HYPERTENSION, BENIGN Well controlled continue  current medications   BACK PAIN, LUMBAR Pain well controlled with Norco UDS Med refill Norco 10/325 mg 1 tab BID x30 tabs  Follow up in 4-6 weeks  Diabetic retinopathy Follow up with ophthalmology   CKD (chronic kidney disease) stage 3, GFR 30-59 ml/min (HCC) BMet Follow up in 4-6 weeks     Return to care in 4-6 weeks   Carollee Leitz, MD  [facility] Medicine Teaching Service

## 2019-07-06 ENCOUNTER — Encounter: Payer: Self-pay | Admitting: Family Medicine

## 2019-07-06 LAB — BASIC METABOLIC PANEL
BUN/Creatinine Ratio: 19 (ref 10–24)
BUN: 41 mg/dL — ABNORMAL HIGH (ref 8–27)
CO2: 22 mmol/L (ref 20–29)
Calcium: 9.6 mg/dL (ref 8.6–10.2)
Chloride: 104 mmol/L (ref 96–106)
Creatinine, Ser: 2.17 mg/dL — ABNORMAL HIGH (ref 0.76–1.27)
GFR calc Af Amer: 36 mL/min/{1.73_m2} — ABNORMAL LOW (ref >59)
GFR calc non Af Amer: 31 mL/min/{1.73_m2} — ABNORMAL LOW (ref >59)
Glucose: 77 mg/dL (ref 65–99)
Potassium: 4.9 mmol/L (ref 3.5–5.2)
Sodium: 140 mmol/L (ref 134–144)

## 2019-07-12 LAB — TOXASSURE SELECT 13 (MW), URINE

## 2019-07-15 ENCOUNTER — Telehealth: Payer: Self-pay | Admitting: Cardiology

## 2019-07-15 NOTE — Telephone Encounter (Signed)
New Message    Pts wife is calling about Repatha  She says it is expired and they do not have insurance     Please call

## 2019-07-16 NOTE — Telephone Encounter (Signed)
Mailed pt assistance forms 07/16/19

## 2019-07-29 ENCOUNTER — Telehealth: Payer: Self-pay | Admitting: Cardiology

## 2019-07-29 DIAGNOSIS — E7800 Pure hypercholesterolemia, unspecified: Secondary | ICD-10-CM

## 2019-07-29 NOTE — Telephone Encounter (Signed)
New Message     Patient's wife calling in stating husband has questions about medication but she's not sure what the issues are would like someone to call her husband back.  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  Please call patient back.

## 2019-07-29 NOTE — Telephone Encounter (Signed)
Spoke with pt, he is wanting to have labs checked to see if the repatha is working for him. Lab work paperwork placed in the mail to the patient. He is also reporting a weakness in his legs after standing or walking up hill, he had dopplers preformed and they were okay. He is going to talk to dr Roxy Manns in September about this. Recall follow up appointment made for November.

## 2019-07-31 ENCOUNTER — Telehealth: Payer: Self-pay | Admitting: Family Medicine

## 2019-07-31 NOTE — Telephone Encounter (Signed)
Pt's wife is calling and would like for the pt's Norco to be refilled.

## 2019-08-02 ENCOUNTER — Telehealth: Payer: Self-pay | Admitting: Family Medicine

## 2019-08-02 ENCOUNTER — Other Ambulatory Visit: Payer: Self-pay | Admitting: Family Medicine

## 2019-08-02 NOTE — Telephone Encounter (Signed)
Spoke with patient today regarding positive UDS.  Patient states that he did take some Valium 1 tablet 3 to 4 days prior to visit to help him sleep.  I kindly reminded him that he had signed a contract with family medicine to get schedule II drugs prescribed at this clinic only. I have recommended that the patient be seen in clinic to help with his insomnia.  I have also talked to the patient that I will be doing random UDS.  Patient was agreeable to plan.  I have asked him to schedule appointment for the beginning of September for further evaluation.  I will refill his Norco when due.  Carollee Leitz, MD

## 2019-08-02 NOTE — Telephone Encounter (Deleted)
Spoke with patient today regarding positive UDS.  Patient states that he did take some Valium 1 tablet 3 to 4 days prior to visit to help him sleep.  I kindly reminded him that he had signed a contract with family medicine to get schedule II drugs prescribed at this clinic only. I have recommended that the patient be seen in clinic to help with his insomnia.  I have also talked to the patient that I will be doing random UDS.  Patient was agreeable to plan.  I have asked him to schedule appointment for the beginning of September for further evaluation.  I will refill his Norco when due.  Shavonna Corella, MD 

## 2019-08-03 ENCOUNTER — Other Ambulatory Visit: Payer: Self-pay | Admitting: Family Medicine

## 2019-08-03 DIAGNOSIS — G8929 Other chronic pain: Secondary | ICD-10-CM

## 2019-08-03 DIAGNOSIS — M545 Low back pain, unspecified: Secondary | ICD-10-CM

## 2019-08-05 NOTE — Telephone Encounter (Signed)
2nd request . Jessica Fleeger, CMA  

## 2019-08-06 NOTE — Telephone Encounter (Signed)
Spoke with Dr. Volanda Napoleon who requested that I fill this Rx in her absence.

## 2019-08-06 NOTE — Telephone Encounter (Signed)
Wife is calling again about the refill to her husbands Norco. She said that he is out and that it was due yesterday for a refill. Can we call this is in. Dr. Volanda Napoleon on vacation and Dr. Susa Simmonds covering. jw

## 2019-08-06 NOTE — Telephone Encounter (Signed)
Dr. Andria Frames filled this medication today for patient. Jazmin Hartsell,CMA

## 2019-08-27 ENCOUNTER — Other Ambulatory Visit: Payer: Self-pay | Admitting: *Deleted

## 2019-08-28 MED ORDER — TAMSULOSIN HCL 0.4 MG PO CAPS: 0.4000 mg | ORAL_CAPSULE | Freq: Every day | ORAL | 3 refills | Status: DC

## 2019-08-28 NOTE — Telephone Encounter (Signed)
LMOVM informing mom. Walida Cajas, CMA  

## 2019-08-28 NOTE — Telephone Encounter (Signed)
Pt has been out since Monday.  Paged PCP.  Please let me know when you fill so I can call wife. Christen Bame, CMA

## 2019-09-02 ENCOUNTER — Ambulatory Visit (HOSPITAL_COMMUNITY)
Admission: RE | Admit: 2019-09-02 | Discharge: 2019-09-02 | Disposition: A | Discharge status: Home / Self Care | Payer: PPO | Source: Ambulatory Visit | Attending: Cardiology | Admitting: Cardiology

## 2019-09-02 ENCOUNTER — Other Ambulatory Visit: Payer: Self-pay

## 2019-09-02 ENCOUNTER — Telehealth (INDEPENDENT_AMBULATORY_CARE_PROVIDER_SITE_OTHER): Payer: PPO | Admitting: Thoracic Surgery (Cardiothoracic Vascular Surgery)

## 2019-09-02 ENCOUNTER — Ambulatory Visit: Payer: Self-pay | Admitting: Thoracic Surgery (Cardiothoracic Vascular Surgery)

## 2019-09-02 ENCOUNTER — Other Ambulatory Visit (HOSPITAL_COMMUNITY): Payer: Self-pay | Admitting: Cardiology

## 2019-09-02 DIAGNOSIS — Z951 Presence of aortocoronary bypass graft: Secondary | ICD-10-CM | POA: Diagnosis not present

## 2019-09-02 DIAGNOSIS — I6523 Occlusion and stenosis of bilateral carotid arteries: Secondary | ICD-10-CM | POA: Diagnosis not present

## 2019-09-02 DIAGNOSIS — E7800 Pure hypercholesterolemia, unspecified: Secondary | ICD-10-CM | POA: Diagnosis not present

## 2019-09-02 LAB — HEPATIC FUNCTION PANEL
ALT: 10 IU/L (ref 0–44)
AST: 14 IU/L (ref 0–40)
Albumin: 4.3 g/dL (ref 3.8–4.8)
Alkaline Phosphatase: 68 IU/L (ref 39–117)
Bilirubin Total: 0.3 mg/dL (ref 0.0–1.2)
Bilirubin, Direct: 0.08 mg/dL (ref 0.00–0.40)
Total Protein: 7.3 g/dL (ref 6.0–8.5)

## 2019-09-02 LAB — LIPID PANEL
Chol/HDL Ratio: 3.4 ratio (ref 0.0–5.0)
Cholesterol, Total: 184 mg/dL (ref 100–199)
HDL: 54 mg/dL (ref >39)
LDL Chol Calc (NIH): 114 mg/dL — ABNORMAL HIGH (ref 0–99)
Triglycerides: 86 mg/dL (ref 0–149)
VLDL Cholesterol Cal: 16 mg/dL (ref 5–40)

## 2019-09-02 NOTE — Patient Instructions (Signed)
Continue all previous medications without any changes at this time  Make every effort to keep your diabetes under very tight control.  Follow up closely with your primary care physician or endocrinologist and strive to keep their hemoglobin A1c levels as low as possible, preferably near or below 6.0.  The long term benefits of strict control of diabetes are far reaching and critically important for your overall health and survival.  Make every effort to stay physically active, get some type of exercise on a regular basis, and stick to a "heart healthy diet".  The long term benefits for regular exercise and a healthy diet are critically important to your overall health and wellbeing.  Continue to avoid smoking at all costs

## 2019-09-02 NOTE — Progress Notes (Signed)
[address]       [place] [id number]             [phone]     CARDIOTHORACIC SURGERY TELEPHONE VIRTUAL OFFICE NOTE  Referring Provider is Colin Bollmanooper, Michael, MD Primary Cardiologist is Colin MillersBrian Crenshaw, MD PCP is Colin Keller, Tanya, MD   HPI:  I spoke with Colin Keller (DOB 03-24-1956 ) via telephone on 09/02/2019 at 3:41 PM and verified that I was speaking with the correct person using more than one form of identification.  We discussed the reason(s) for conducting our visit virtually instead of in-person.  The patient expressed understanding the circumstances and agreed to proceed as described.   Patient is a 63 year old male with coronary artery disease, hypertension, poorly controlled insulin-dependent type 2 diabetes mellitus with multiple complications, hyperlipidemia, and long-standing tobacco abuse who underwent post coronary artery bypass grafting x3 on August 23, 2018 for severe left main and three-vessel coronary artery disease status post acute non-ST segment elevation myocardial infarction.  The patient was last seen here in our office on September 25, 2018 at which time he was doing fairly well.  He was hospitalized shortly thereafter for a brief episode of acute on chronic diastolic congestive heart failure which quickly resolved with medical therapy.  Since then the patient has done very well from a cardiac standpoint.  He has been followed carefully by Dr. Jens Keller and by his primary care physicians.  He quit smoking completely after his surgery.  He still struggles with glycemic control, and he is scheduled to see a new primary care physician later this week to make further adjustments to his medical therapy for diabetes.  He is back at work and overall getting along quite well.  Overall he feels "much improved" in comparison with how he felt at the time of the surgery.  He specifically denies any symptoms of exertional shortness of breath or chest discomfort.   His physical job is labor-intensive and he has been doing very well.  Overall he states that he has no physical limitations and he is very pleased with his outcome.    Current Outpatient Medications  Medication Sig Dispense Refill   acetaminophen (TYLENOL) 500 MG tablet Take 2 tablets (1,000 mg total) by mouth every 6 (six) hours as needed for mild pain. (Patient taking differently: Take 1,000 mg by mouth at bedtime. ) 30 tablet 0   aspirin 81 MG tablet Take 81 mg by mouth daily.     clopidogrel (PLAVIX) 75 MG tablet TAKE 1 TABLET BY MOUTH DAILY 30 tablet 3   Evolocumab (REPATHA [place]) Inject 1 Dose into the skin every 14 (fourteen) days. Injection every 2 weeks.      Famotidine 20 MG CHEW Chew 20 mg by mouth daily as needed (for reflux).     furosemide (LASIX) 40 MG tablet TAKE 1 TABLET BY MOUTH DAILY 30 tablet 3   HYDROcodone-acetaminophen (NORCO) 10-325 MG tablet TAKE 1 TABLET BY MOUTH TWICE DAILY AS NEEDED FOR MODERATE OR SEVERE PAIN 60 tablet 0   insulin aspart (NOVOLOG FLEXPEN) 100 UNIT/ML FlexPen Inject 4-6 Units into the skin 3 (three) times daily with meals. 2 pen 0   insulin degludec (TRESIBA FLEXTOUCH) 100 UNIT/ML SOPN FlexTouch Pen Inject 0.14 mLs (14 Units total) into the skin every morning. 4 pen 0   Menthol, Topical Analgesic, (BIOFREEZE ROLL-ON) 4 % GEL Apply 1 application topically as needed (shoulder pain).     metFORMIN (GLUCOPHAGE) 500 MG  tablet Take 2 tablets (1,000 mg total) by mouth 2 (two) times daily with a meal. 180 tablet 3   metoprolol tartrate (LOPRESSOR) 50 MG tablet Take 1 tablet (50 mg total) by mouth 2 (two) times daily. 180 tablet 3   tamsulosin (FLOMAX) 0.4 MG CAPS capsule Take 1 capsule (0.4 mg total) by mouth daily. 30 capsule 3   No current facility-administered medications for this visit.      Diagnostic Tests:  n/a   Impression:  Patient is doing very well approximately 1 year status post coronary artery bypass grafting   Plan:  We  have not recommended any changes to the patient's current medications.  The patient has been congratulated for staying tobacco free.  He has been encouraged to continue to increase his physical activity without any particular limitations.  We have discussed the importance of glycemic control and close attention to medical therapy for the patient's diabetes.  The patient will continue to follow-up with his regular doctor and with Dr. Stanford Keller and return to our office in the future only should specific problems or questions arise.  All questions answered.    I discussed limitations of evaluation and management via telephone.  The patient was advised to call back for repeat telephone consultation or to seek an in-person evaluation if questions arise or the patient's clinical condition changes in any significant manner.  I spent in excess of 10 minutes of non-face-to-face time during the conduct of this telephone virtual office consultation.    Valentina Gu. Roxy Manns, MD 09/02/2019 3:41 PM

## 2019-09-03 ENCOUNTER — Other Ambulatory Visit: Payer: Self-pay | Admitting: *Deleted

## 2019-09-03 ENCOUNTER — Telehealth: Payer: Self-pay | Admitting: *Deleted

## 2019-09-03 DIAGNOSIS — I6523 Occlusion and stenosis of bilateral carotid arteries: Secondary | ICD-10-CM

## 2019-09-03 NOTE — Telephone Encounter (Signed)
-----   Message from Lelon Perla, MD sent at 09/02/2019  4:53 PM EDT ----- Add zetia 10 mg daily; lipids and liver 6 weeks [place]

## 2019-09-03 NOTE — Telephone Encounter (Addendum)
-----   Message from Lelon Perla, MD sent at 09/02/2019  4:53 PM EDT ----- Add zetia 10 mg daily; lipids and liver 6 weeks [place] with pt, he reports he was on repatha in the past and was not able to get it anymore because of the cost. He had seen kristin in the hallway and she is working with him again to get him back on repatha. He will not start the zetia at this time.

## 2019-09-06 ENCOUNTER — Other Ambulatory Visit: Payer: Self-pay

## 2019-09-06 ENCOUNTER — Ambulatory Visit (INDEPENDENT_AMBULATORY_CARE_PROVIDER_SITE_OTHER): Payer: PPO | Admitting: Family Medicine

## 2019-09-06 DIAGNOSIS — M545 Low back pain, unspecified: Secondary | ICD-10-CM

## 2019-09-06 DIAGNOSIS — G479 Sleep disorder, unspecified: Secondary | ICD-10-CM | POA: Diagnosis not present

## 2019-09-06 DIAGNOSIS — G8929 Other chronic pain: Secondary | ICD-10-CM

## 2019-09-06 MED ORDER — HYDROCODONE-ACETAMINOPHEN 10-325 MG PO TABS: ORAL_TABLET | ORAL | 0 refills | Status: DC

## 2019-09-06 NOTE — Progress Notes (Signed)
  Patient Name: Colin Keller Date of Birth: 1956-07-15 Date of Visit: 09/08/19 PCP: Carollee Leitz, MD  Chief Complaint:   Subjective: Colin Keller is a pleasant 63 y.o. with medical history significant for chronic joint pain presenting today for medication refill and difficulty sleeping.   Difficulty sleeping -Patient states that he has no problems falling asleep but during his sleep cycle he does tend to wake up and he has difficulty falling back to sleep.  It appears he has a good sleep hygiene.  He does not have TV in his room, when he feels tired.  He denies any use of caffeine prior to going to bed.  He states that his last meal is usually 3 to 4 hours before going to sleep.  He states that on average he sleeps about 3 to 4 hours nightly and does not feel refreshed when he wakes up.  He does not have any history of OSA, does not use CPAP/BiPAP.  He is tried melatonin without relief, he has taken Benadryl half a tablet with some relief but feels that he is still groggy in the morning.  Chronic pain -He continues to have lower back pain and shoulder pain.  This is well controlled with Norco 10/325 mg twice daily.  He plans to look into having back surgery as he is now covered under Medicaid.   ROS: Per HPI.   I have reviewed the patient's medical, surgical, family, and social history as appropriate.  Vitals:   09/06/19 1538  BP: 125/75  Pulse: (!) 58  SpO2: 100%    General: Well-appearing in no acute distress Cardiac: Regular rate and rhythm, no murmurs appreciated Lungs: Clear to auscultation bilaterally, no crackles, no rhonchi  Encounter for chronic pain management Last UDS positive for benzos.  Spoke with patient regarding this.  He is in agreement to avoid benzodiazepines and continue with just receiving schedule II drugs from clinic. Refill Norco 10/325 mg twice daily x60 tablets, no refills Repeat UDS next visit.  Sleeping difficulties Patient currently having difficulty  staying asleep. We have discussed different options i.e. trazodone, Ambien, increasing melatonin. In collaboration with patient we decided to increase melatonin to 4 mg x 1-2 weeks if continues to have difficulty to increase to 6 mg until next visit.  If continues to have difficulty remaining asleep at night we will discuss options mentioned above. We also discussed the possibility of seeking therapy to help with any issues that may not be obvious at this point time with regards to not being able to remain asleep. Advised to continue good sleep hygiene.    Return to care in 1 month or sooner if needed  Carollee Leitz, MD  [facility] Medicine Teaching Service

## 2019-09-06 NOTE — Patient Instructions (Addendum)
It was a pleasure seeing you today.  I refilled your Norco.  Try increasing your melatonin to 4 mg nightly for a week.  If you do not find any success increase your melatonin to 6 mg nightly.  Continue with good sleep hygiene.   If increasing melatonin does not help we can discuss other options at your next visit.  Carollee Leitz, MD

## 2019-09-08 ENCOUNTER — Encounter: Payer: Self-pay | Admitting: Family Medicine

## 2019-09-08 DIAGNOSIS — G479 Sleep disorder, unspecified: Secondary | ICD-10-CM | POA: Insufficient documentation

## 2019-09-08 HISTORY — DX: Sleep disorder, unspecified: G47.9

## 2019-09-08 NOTE — Assessment & Plan Note (Signed)
Patient currently having difficulty staying asleep. We have discussed different options i.e. trazodone, Ambien, increasing melatonin. In collaboration with patient we decided to increase melatonin to 4 mg x 1-2 weeks if continues to have difficulty to increase to 6 mg until next visit.  If continues to have difficulty remaining asleep at night we will discuss options mentioned above. We also discussed the possibility of seeking therapy to help with any issues that may not be obvious at this point time with regards to not being able to remain asleep. Advised to continue good sleep hygiene.

## 2019-09-08 NOTE — Assessment & Plan Note (Signed)
Last UDS positive for benzos.  Spoke with patient regarding this.  He is in agreement to avoid benzodiazepines and continue with just receiving schedule II drugs from clinic. Refill Norco 10/325 mg twice daily x60 tablets, no refills Repeat UDS next visit.

## 2019-09-10 ENCOUNTER — Telehealth: Payer: Self-pay

## 2019-09-10 MED ORDER — REPATHA SURECLICK 140 MG/ML ~~LOC~~ SOAJ: 140.0000 mg | SUBCUTANEOUS | 11 refills | Status: DC

## 2019-09-10 NOTE — Telephone Encounter (Signed)
Called and lerft a msg stating that the insurance approved the repatha and that I will send an rx to the pharmacy and to call us back if the drug is unaffordable

## 2019-09-19 ENCOUNTER — Telehealth: Payer: Self-pay

## 2019-09-19 NOTE — Telephone Encounter (Signed)
lmomed the pt regarding snf denial and instructed pt to call us back if they are having issues getting it filled at the pharmacy

## 2019-09-24 NOTE — Telephone Encounter (Signed)
Unable to afford co-pay. [place] phone number and instructions to apply provided today.

## 2019-10-04 ENCOUNTER — Telehealth: Payer: Self-pay | Admitting: Family Medicine

## 2019-10-04 NOTE — Telephone Encounter (Signed)
Pt's wife is calling to request having pt's Norco refilled.

## 2019-10-07 ENCOUNTER — Other Ambulatory Visit: Payer: Self-pay

## 2019-10-07 ENCOUNTER — Telehealth: Payer: Self-pay

## 2019-10-07 DIAGNOSIS — G8929 Other chronic pain: Secondary | ICD-10-CM

## 2019-10-07 DIAGNOSIS — M545 Low back pain, unspecified: Secondary | ICD-10-CM

## 2019-10-07 NOTE — Telephone Encounter (Signed)
Patients wife calling about this refill again. She states he's been out of it all weekend and was due for refill last Friday.

## 2019-10-07 NOTE — Telephone Encounter (Signed)
Called and lmomed the pt regarding applying for repatha ready and [employer] & the contact information for both of those was provided. I also instructed the pt to call us back to let us know the outcome for both of those applications once a decision is made

## 2019-10-07 NOTE — Telephone Encounter (Signed)
Sent refill request. Colin Keller, [place]

## 2019-10-08 NOTE — Telephone Encounter (Signed)
Pt wife is calling to check on the status of having pts hydrocodone refilled. She would like this done asap because the pt has been completely out of medication since Friday.

## 2019-10-09 ENCOUNTER — Telehealth: Payer: Self-pay | Admitting: Family Medicine

## 2019-10-09 ENCOUNTER — Other Ambulatory Visit: Payer: Self-pay | Admitting: Family Medicine

## 2019-10-09 DIAGNOSIS — M545 Low back pain, unspecified: Secondary | ICD-10-CM

## 2019-10-09 DIAGNOSIS — G8929 Other chronic pain: Secondary | ICD-10-CM

## 2019-10-09 MED ORDER — HYDROCODONE-ACETAMINOPHEN 10-325 MG PO TABS: 1.0000 | ORAL_TABLET | Freq: Two times a day (BID) | ORAL | 0 refills | Status: DC

## 2019-10-09 MED ORDER — HYDROCODONE-ACETAMINOPHEN 10-325 MG PO TABS: ORAL_TABLET | ORAL | 0 refills | Status: DC

## 2019-10-09 NOTE — Telephone Encounter (Signed)
Patient called the pharmacy after the Norco was supposed to have been filled, but the pharmacy is telling them that they cannot fill without the proper instructions for it.

## 2019-10-09 NOTE — Addendum Note (Signed)
Addended by: Dorna Bloom on: 10/09/2019 10:37 AM   Modules accepted: Orders

## 2019-10-09 NOTE — Telephone Encounter (Signed)
Please add the directions for medication. Thanks!

## 2019-10-09 NOTE — Telephone Encounter (Signed)
They have been having issues getting his Norco filled, and pt spouse not so happy with pcp and are requesting to possibly switch to a male pcp or possibly his spouses' pcp.  Please call patient regarding these concerns and the status of the last prescription refill request.  [phone]

## 2019-10-14 ENCOUNTER — Other Ambulatory Visit: Payer: Self-pay

## 2019-10-14 DIAGNOSIS — E785 Hyperlipidemia, unspecified: Secondary | ICD-10-CM

## 2019-10-14 NOTE — Progress Notes (Signed)
Lipid for pa 

## 2019-10-16 ENCOUNTER — Other Ambulatory Visit: Payer: Self-pay | Admitting: Family Medicine

## 2019-10-16 DIAGNOSIS — I5033 Acute on chronic diastolic (congestive) heart failure: Secondary | ICD-10-CM

## 2019-10-28 NOTE — Progress Notes (Signed)
HPI: Follow-up coronary artery disease. [facility] with non-ST elevation myocardial infarction. Echocardiogram September 2019 showed ejection fraction 50 to 55% and mild diastolic dysfunction. Cardiac catheterization September 2019 showed severe coronary disease and he ultimately had coronary artery bypass and graft with a LIMA to the LAD, RIMA to the right coronary artery and saphenous vein graft to the obtuse marginal. He had problems with hyperkalemia postoperatively. Also with chronic stage III kidney disease.Echo 10/19 showed EF 50, mild MR and biatrial enlargement. Carotid Dopplers September 2020 showed 40 to 59% bilateral stenosis and right subclavian stenosis.  Since he was last seenthere is no dyspnea, chest pain, palpitations or syncope.  Current Outpatient Medications  Medication Sig Dispense Refill  . acetaminophen (TYLENOL) 500 MG tablet Take 2 tablets (1,000 mg total) by mouth every 6 (six) hours as needed for mild pain. (Patient taking differently: Take 1,000 mg by mouth at bedtime. ) 30 tablet 0  . aspirin 81 MG tablet Take 81 mg by mouth daily.    . Evolocumab (REPATHA SURECLICK) 140 MG/ML SOAJ Inject 140 mg into the skin every 14 (fourteen) days. 2 pen 11  . Famotidine 20 MG CHEW Chew 20 mg by mouth daily as needed (for reflux).    . furosemide (LASIX) 40 MG tablet TAKE 1 TABLET BY MOUTH DAILY 30 tablet 3  . HYDROcodone-acetaminophen (NORCO) 10-325 MG tablet Take 1 tablet by mouth 2 (two) times daily. Take 1 tablet twice a day 60 tablet 0  . insulin aspart (NOVOLOG FLEXPEN) 100 UNIT/ML FlexPen Inject 4-6 Units into the skin 3 (three) times daily with meals. 2 pen 0  . insulin degludec (TRESIBA FLEXTOUCH) 100 UNIT/ML SOPN FlexTouch Pen Inject 0.14 mLs (14 Units total) into the skin every morning. 4 pen 0  . Menthol, Topical Analgesic, (BIOFREEZE ROLL-ON) 4 % GEL Apply 1 application topically as needed (shoulder pain).    . metFORMIN  (GLUCOPHAGE) 500 MG tablet Take 2 tablets (1,000 mg total) by mouth 2 (two) times daily with a meal. 180 tablet 3  . metoprolol tartrate (LOPRESSOR) 50 MG tablet Take 1 tablet (50 mg total) by mouth 2 (two) times daily. 180 tablet 3  . tamsulosin (FLOMAX) 0.4 MG CAPS capsule Take 1 capsule (0.4 mg total) by mouth daily. 30 capsule 3   No current facility-administered medications for this visit.      Past Medical History:  Diagnosis Date  . Acute diastolic heart failure (HCC) 08/18/2018  . CHF (congestive heart failure) (HCC)   . CKD (chronic kidney disease), stage III   . Coronary artery disease   . Diabetes mellitus   . Dyspnea   . GERD (gastroesophageal reflux disease)   . Hx of CABG 08/23/2018   LIMA to LAD, RIMA to RCA, SVG to OM3, EVH via right thigh  . Hyperlipidemia   . Hypertension   . 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 Tobacco abuse   . Tobacco abuse disorder    Qualifier: Diagnosis of  By: Lafonda Mossesverstreet MD, Darl PikesSusan      Past Surgical History:  Procedure Laterality Date  . CORONARY ARTERY BYPASS GRAFT N/A 08/23/2018   Procedure: CORONARY ARTERY BYPASS GRAFTING (CABG) x 3; -Left Internal Mammary Artery to Left Anterior Descending Artery, -Right Internal Mammary Artery to Right Coronary Artery, -Saphenous Vein Graft to Obtuse Marginal;  ENDOSCOPIC HARVEST GREATER SAPHENOUS VEIN  -Right Thigh;  Surgeon: Purcell Nails, MD;  Location: [facility];  Service: Open Heart Surgery;  Laterality: N/A;  . INTRAVASCULAR PRESSURE WIRE/FFR STUDY N/A 08/20/2018   Procedure: INTRAVASCULAR PRESSURE WIRE/FFR STUDY;  Surgeon: Tonny Bollman, MD;  Location: [facility] INVASIVE CV LAB;  Service: Cardiovascular;  Laterality: N/A;  . LEFT HEART CATH AND CORONARY ANGIOGRAPHY N/A 08/20/2018   Procedure: LEFT HEART CATH AND CORONARY ANGIOGRAPHY;  Surgeon: Tonny Bollman, MD;  Location: [facility] INVASIVE CV LAB;  Service: Cardiovascular;  Laterality: N/A;  . TEE WITHOUT  CARDIOVERSION N/A 08/23/2018   Procedure: TRANSESOPHAGEAL ECHOCARDIOGRAM (TEE);  Surgeon: Purcell Nails, MD;  Location: [facility] OR;  Service: Open Heart Surgery;  Laterality: N/A;    Social History   Socioeconomic History  . Marital status: Married    Spouse name: Not on file  . Number of children: Not on file  . Years of education: Not on file  . Highest education level: Not on file  Occupational History  . Not on file  Social Needs  . Financial resource strain: Not on file  . Food insecurity    Worry: Not on file    Inability: Not on file  . Transportation needs    Medical: Not on file    Non-medical: Not on file  Tobacco Use  . Smoking status: Former Smoker    Packs/day: 0.50    Years: 45.00    Pack years: 22.50    Types: Cigarettes    Start date: 08/12/1970    Quit date: 08/12/2018    Years since quitting: 1.2  . Smokeless tobacco: Never Used  . Tobacco comment: Quit for 4.5 months in August 2016  Substance and Sexual Activity  . Alcohol use: No    Alcohol/week: 0.0 standard drinks  . Drug use: No  . Sexual activity: Yes  Lifestyle  . Physical activity    Days per week: Not on file    Minutes per session: Not on file  . Stress: Not on file  Relationships  . Social Musician on phone: Not on file    Gets together: Not on file    Attends religious service: Not on file    Active member of club or organization: Not on file    Attends meetings of clubs or organizations: Not on file    Relationship status: Not on file  . Intimate partner violence    Fear of current or ex partner: Not on file    Emotionally abused: Not on file    Physically abused: Not on file    Forced sexual activity: Not on file  Other Topics Concern  . 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 He and his wife were both laid off during recession. Wife Lawson Fiscal is also [facility] patient. Grown children. 1ppd smoker. Previously used marijuana -quit 10/03           Family History  Problem Relation Age of Onset  . Heart disease Mother   . Diabetes Mother   . Heart failure Mother   . Heart disease Father   . Sudden death Brother     ROS: no fevers or chills, productive cough, hemoptysis, dysphasia, odynophagia, melena, hematochezia, dysuria, hematuria, rash, seizure activity, orthopnea, PND, pedal edema, claudication. Remaining systems are negative.  Physical Exam: Well-developed well-nourished in no acute distress.  Skin is warm and dry.  HEENT is normal.  Neck is supple.  Chest is clear to auscultation with normal expansion.  Cardiovascular exam is regular rate and rhythm.  Abdominal exam nontender or distended. No masses palpated.  Bruit noted. Extremities show no edema. neuro grossly intact  ECG-sinus rhythm at a rate of 64, cannot rule out prior inferior infarct.  Personally reviewed  A/P  1 coronary artery disease status post coronary artery bypass graft-patient denies recurrent chest pain.  Intolerant to statins.  2 hypertension-blood pressure controlled.  Continue present medications.  3 hyperlipidemia-patient is intolerant to statins.  Refer to lipid clinic and resume Repatha.  4  Carotid artery disease-plan follow-up carotid Doppler September 2021.  5 chronic combined systolic/diastolic congestive heart failure-symptoms are well controlled.  Continue present dose of diuretic.  Check potassium and renal function.  6 chronic stage III kidney disease-we will refer to nephrology.  7 abdominal bruit-schedule ultrasound to exclude aneurysm.  Kirk Ruths, MD

## 2019-11-04 ENCOUNTER — Other Ambulatory Visit: Payer: Self-pay | Admitting: Cardiology

## 2019-11-04 ENCOUNTER — Other Ambulatory Visit: Payer: Self-pay | Admitting: Family Medicine

## 2019-11-04 DIAGNOSIS — E1165 Type 2 diabetes mellitus with hyperglycemia: Secondary | ICD-10-CM

## 2019-11-04 NOTE — Telephone Encounter (Signed)
Patients wife called to get a refill on patients [facility], please give pt's wife a call back.

## 2019-11-06 NOTE — Telephone Encounter (Signed)
Pt wife is calling to check on the status of pt medication being refilled.   I informed her that refill request can take 24-48 hours.

## 2019-11-08 ENCOUNTER — Other Ambulatory Visit: Payer: Self-pay | Admitting: Family Medicine

## 2019-11-08 DIAGNOSIS — G8929 Other chronic pain: Secondary | ICD-10-CM

## 2019-11-08 DIAGNOSIS — M545 Low back pain, unspecified: Secondary | ICD-10-CM

## 2019-11-08 MED ORDER — HYDROCODONE-ACETAMINOPHEN 10-325 MG PO TABS: 1.0000 | ORAL_TABLET | Freq: Two times a day (BID) | ORAL | 0 refills | Status: DC

## 2019-11-11 ENCOUNTER — Ambulatory Visit (INDEPENDENT_AMBULATORY_CARE_PROVIDER_SITE_OTHER): Payer: PPO | Admitting: Cardiology

## 2019-11-11 ENCOUNTER — Encounter: Payer: Self-pay | Admitting: Cardiology

## 2019-11-11 ENCOUNTER — Other Ambulatory Visit: Payer: Self-pay

## 2019-11-11 VITALS — BP 128/70 | HR 64 | Temp 97.5°F | Ht 71.0 in | Wt 156.0 lb

## 2019-11-11 DIAGNOSIS — I2510 Atherosclerotic heart disease of native coronary artery without angina pectoris: Secondary | ICD-10-CM

## 2019-11-11 DIAGNOSIS — R0989 Other specified symptoms and signs involving the circulatory and respiratory systems: Secondary | ICD-10-CM

## 2019-11-11 DIAGNOSIS — I10 Essential (primary) hypertension: Secondary | ICD-10-CM | POA: Diagnosis not present

## 2019-11-11 DIAGNOSIS — E7800 Pure hypercholesterolemia, unspecified: Secondary | ICD-10-CM | POA: Diagnosis not present

## 2019-11-11 DIAGNOSIS — N289 Disorder of kidney and ureter, unspecified: Secondary | ICD-10-CM | POA: Diagnosis not present

## 2019-11-11 LAB — BASIC METABOLIC PANEL
BUN/Creatinine Ratio: 28 — ABNORMAL HIGH (ref 10–24)
BUN: 46 mg/dL — ABNORMAL HIGH (ref 8–27)
CO2: 22 mmol/L (ref 20–29)
Calcium: 9.7 mg/dL (ref 8.6–10.2)
Chloride: 100 mmol/L (ref 96–106)
Creatinine, Ser: 1.64 mg/dL — ABNORMAL HIGH (ref 0.76–1.27)
GFR calc Af Amer: 50 mL/min/{1.73_m2} — ABNORMAL LOW (ref >59)
GFR calc non Af Amer: 43 mL/min/{1.73_m2} — ABNORMAL LOW (ref >59)
Glucose: 321 mg/dL — ABNORMAL HIGH (ref 65–99)
Potassium: 4.9 mmol/L (ref 3.5–5.2)
Sodium: 136 mmol/L (ref 134–144)

## 2019-11-11 NOTE — Patient Instructions (Signed)
Medication Instructions:  NO CHANGE *If you need a refill on your cardiac medications before your next appointment, please call your pharmacy*  Lab Work: Your physician recommends that you HAVE LAB WORK TODAY If you have labs (blood work) drawn today and your tests are completely normal, you will receive your results only by: Marland Kitchen MyChart Message (if you have MyChart) OR . A paper copy in the mail If you have any lab test that is abnormal or we need to change your treatment, we will call you to review the results.  Testing: Your physician has requested that you have an abdominal aorta duplex. During this test, an ultrasound is used to evaluate the aorta. Allow 30 minutes for this exam. Do not eat after midnight the day before and avoid carbonated beverages [facility]  Follow-Up: At [facility], you and your health needs are our priority.  As part of our continuing mission to provide you with exceptional heart care, we have created designated Provider Care Teams.  These Care Teams include your primary Cardiologist (physician) and Advanced Practice Providers (APPs -  Physician Assistants and Nurse Practitioners) who all work together to provide you with the care you need, when you need it.  Your next appointment:   12 month(s)  The format for your next appointment:   Either In Person or Virtual  Provider:   You may see Kirk Ruths, MD or one of the following Advanced Practice Providers on your designated Care Team:    Kerin Ransom, PA-C  [place], [place]  Coletta Memos, [place]   Other Instructions REFERRAL TO NEPHROLOGY FOR CHRONIC RENAL INSUFF  FOLLOW UP WITH LIPID CLINIC TO RESTART REPATHA

## 2019-11-12 ENCOUNTER — Encounter: Payer: Self-pay | Admitting: *Deleted

## 2019-11-18 ENCOUNTER — Telehealth: Payer: Self-pay

## 2019-11-18 DIAGNOSIS — IMO0002 Reserved for concepts with insufficient information to code with codable children: Secondary | ICD-10-CM

## 2019-11-18 DIAGNOSIS — E1142 Type 2 diabetes mellitus with diabetic polyneuropathy: Secondary | ICD-10-CM

## 2019-11-18 DIAGNOSIS — E1165 Type 2 diabetes mellitus with hyperglycemia: Secondary | ICD-10-CM

## 2019-11-18 NOTE — Telephone Encounter (Signed)
Please provide Tresiba samples AND samples of FIASP insulin (same dosing as Novolog).  Thanks

## 2019-11-18 NOTE — Telephone Encounter (Signed)
Patient LVM on nurse line requesting insulin samples. Patient is on [place] and Novolog. Patient stated she has not been working as much and can not afford insulin at the pharmacy. We do appear to have Tresiba samples, however I did not see Novolog. Ok to give [place]. Will forward to PCP and [place].

## 2019-11-19 ENCOUNTER — Other Ambulatory Visit: Payer: Self-pay

## 2019-11-19 ENCOUNTER — Ambulatory Visit (HOSPITAL_COMMUNITY)
Admission: RE | Admit: 2019-11-19 | Discharge: 2019-11-19 | Disposition: A | Discharge status: Home / Self Care | Payer: PPO | Source: Ambulatory Visit | Attending: Cardiovascular Disease | Admitting: Cardiovascular Disease

## 2019-11-19 DIAGNOSIS — E119 Type 2 diabetes mellitus without complications: Secondary | ICD-10-CM | POA: Insufficient documentation

## 2019-11-19 DIAGNOSIS — I708 Atherosclerosis of other arteries: Secondary | ICD-10-CM | POA: Insufficient documentation

## 2019-11-19 DIAGNOSIS — I2510 Atherosclerotic heart disease of native coronary artery without angina pectoris: Secondary | ICD-10-CM | POA: Insufficient documentation

## 2019-11-19 DIAGNOSIS — E785 Hyperlipidemia, unspecified: Secondary | ICD-10-CM | POA: Diagnosis not present

## 2019-11-19 DIAGNOSIS — Z136 Encounter for screening for cardiovascular disorders: Secondary | ICD-10-CM | POA: Diagnosis not present

## 2019-11-19 DIAGNOSIS — Z87891 Personal history of nicotine dependence: Secondary | ICD-10-CM | POA: Diagnosis not present

## 2019-11-19 DIAGNOSIS — I700 Atherosclerosis of aorta: Secondary | ICD-10-CM | POA: Diagnosis not present

## 2019-11-19 DIAGNOSIS — R0989 Other specified symptoms and signs involving the circulatory and respiratory systems: Secondary | ICD-10-CM | POA: Diagnosis not present

## 2019-11-19 DIAGNOSIS — Z951 Presence of aortocoronary bypass graft: Secondary | ICD-10-CM | POA: Diagnosis not present

## 2019-11-19 DIAGNOSIS — I10 Essential (primary) hypertension: Secondary | ICD-10-CM | POA: Insufficient documentation

## 2019-11-20 NOTE — Telephone Encounter (Signed)
LVM for patient to return call to nurse line.

## 2019-11-22 MED ORDER — FIASP FLEXTOUCH 100 UNIT/ML ~~LOC~~ SOPN: 4.0000 [IU] | PEN_INJECTOR | Freq: Three times a day (TID) | SUBCUTANEOUS | 0 refills | Status: AC

## 2019-11-22 MED ORDER — TRESIBA FLEXTOUCH 100 UNIT/ML ~~LOC~~ SOPN: 14.0000 [IU] | PEN_INJECTOR | SUBCUTANEOUS | 0 refills | Status: DC

## 2019-11-22 MED ORDER — NOVOLOG FLEXPEN 100 UNIT/ML ~~LOC~~ SOPN: 4.0000 [IU] | PEN_INJECTOR | Freq: Three times a day (TID) | SUBCUTANEOUS | 0 refills | Status: DC

## 2019-11-22 NOTE — Telephone Encounter (Signed)
Pts wife calls for 2 reason:   1. To see if we have samples - given one pen of each per instruction of Dr. Valentina Lucks  2. Ask that we [facility]ills [facility] [place] as they may be cheaper than [place] [facility]. - Done as requested.  Christen Bame, CMA

## 2019-11-23 ENCOUNTER — Other Ambulatory Visit: Payer: Self-pay | Admitting: Cardiology

## 2019-11-26 NOTE — Telephone Encounter (Signed)
Rx has been sent to the pharmacy electronically. ° °

## 2019-11-28 DIAGNOSIS — E785 Hyperlipidemia, unspecified: Secondary | ICD-10-CM | POA: Diagnosis not present

## 2019-11-28 DIAGNOSIS — E1122 Type 2 diabetes mellitus with diabetic chronic kidney disease: Secondary | ICD-10-CM | POA: Diagnosis not present

## 2019-11-28 DIAGNOSIS — I2510 Atherosclerotic heart disease of native coronary artery without angina pectoris: Secondary | ICD-10-CM | POA: Diagnosis not present

## 2019-11-28 DIAGNOSIS — N1832 Chronic kidney disease, stage 3b: Secondary | ICD-10-CM | POA: Diagnosis not present

## 2019-11-28 DIAGNOSIS — R809 Proteinuria, unspecified: Secondary | ICD-10-CM | POA: Diagnosis not present

## 2019-11-28 DIAGNOSIS — I129 Hypertensive chronic kidney disease with stage 1 through stage 4 chronic kidney disease, or unspecified chronic kidney disease: Secondary | ICD-10-CM | POA: Diagnosis not present

## 2019-11-28 DIAGNOSIS — I509 Heart failure, unspecified: Secondary | ICD-10-CM | POA: Diagnosis not present

## 2019-11-28 DIAGNOSIS — Z951 Presence of aortocoronary bypass graft: Secondary | ICD-10-CM | POA: Diagnosis not present

## 2019-11-28 NOTE — Telephone Encounter (Signed)
Contacted patient RE Diabetes control, medication supply and tobacco use (cessation).   States control has been "up and down" but overall he is doing well.   When I offered him a supply of Tresiba vial, he was very happy.  He will pick up 5 vials later today.  Medication Samples have been provided to the patient. Drug name: Tyler Aas       Strength: 100units/ml        Qty: 5  LOT: [id number]  Exp.Date: 11/10/2020  Dosing instructions: 14 units daily  The patient has been instructed regarding the correct time, dose, and frequency of taking this medication, including desired effects and most common side effects.   Janeann Forehand 11:27 AM 11/28/2019  Patient reports continued abstinence from tobacco.

## 2019-12-02 ENCOUNTER — Other Ambulatory Visit: Payer: Self-pay | Admitting: Family Medicine

## 2019-12-02 DIAGNOSIS — M545 Low back pain, unspecified: Secondary | ICD-10-CM

## 2019-12-02 DIAGNOSIS — G8929 Other chronic pain: Secondary | ICD-10-CM

## 2019-12-02 NOTE — Telephone Encounter (Signed)
Patient wife calling for [name] refill on Norco.

## 2019-12-04 MED ORDER — HYDROCODONE-ACETAMINOPHEN 10-325 MG PO TABS: 1.0000 | ORAL_TABLET | Freq: Two times a day (BID) | ORAL | 0 refills | Status: DC

## 2019-12-04 MED ORDER — METFORMIN HCL 500 MG PO TABS: 1000.0000 mg | ORAL_TABLET | Freq: Two times a day (BID) | ORAL | 3 refills | Status: DC

## 2019-12-10 ENCOUNTER — Other Ambulatory Visit: Payer: Self-pay

## 2019-12-10 ENCOUNTER — Ambulatory Visit: Payer: PPO | Admitting: Pharmacist

## 2019-12-10 DIAGNOSIS — E1165 Type 2 diabetes mellitus with hyperglycemia: Secondary | ICD-10-CM

## 2019-12-16 ENCOUNTER — Other Ambulatory Visit: Payer: Self-pay | Admitting: Family Medicine

## 2019-12-17 ENCOUNTER — Telehealth: Payer: Self-pay | Admitting: *Deleted

## 2019-12-17 ENCOUNTER — Ambulatory Visit: Payer: PPO | Admitting: Pharmacist

## 2019-12-17 ENCOUNTER — Other Ambulatory Visit: Payer: Self-pay

## 2019-12-17 DIAGNOSIS — E1165 Type 2 diabetes mellitus with hyperglycemia: Secondary | ICD-10-CM

## 2019-12-17 NOTE — Telephone Encounter (Signed)
Appointment with [facility] was on 11/28/19

## 2019-12-19 ENCOUNTER — Other Ambulatory Visit: Payer: Self-pay

## 2019-12-19 ENCOUNTER — Ambulatory Visit (INDEPENDENT_AMBULATORY_CARE_PROVIDER_SITE_OTHER): Payer: PPO | Admitting: Pharmacist Clinician (PhC)/ Clinical Pharmacy Specialist

## 2019-12-19 VITALS — BP 146/70 | HR 63 | Resp 15 | Ht 71.0 in | Wt 159.6 lb

## 2019-12-19 DIAGNOSIS — G720 Drug-induced myopathy: Secondary | ICD-10-CM

## 2019-12-19 DIAGNOSIS — T466X5A Adverse effect of antihyperlipidemic and antiarteriosclerotic drugs, initial encounter: Secondary | ICD-10-CM

## 2019-12-19 DIAGNOSIS — E7800 Pure hypercholesterolemia, unspecified: Secondary | ICD-10-CM

## 2019-12-19 NOTE — Assessment & Plan Note (Signed)
Patient with elevated LDL and history of CABG x 3 in 2019, not at goal.  Patient has had myalgias in the past with multiple statin drugs.  Was previously on Repatha, but was discontinued on free medication when he turned 63 last year.  Will apply to his new Medicare insurance plan for approval and patient was given information regarding [employer] Well foundation to pay his $90/month copay required by insurance.  He will repeat labs in March, after 4-5 doses.

## 2019-12-19 NOTE — Patient Instructions (Addendum)
Your Results:             Your most recent labs Goal  Total Cholesterol 184 < 200  Triglycerides 86 < 150  HDL (happy/good cholesterol) 54 > 40  LDL (lousy/bad cholesterol 114 < 70     Medication changes:   We will start the paperwork for Repatha.  We will call once it is approved  Lab orders:  Repeat labs after 5-6 dose (March 2021)  Patient Assistance:  The Health Well foundation offers assistance to help pay for medication copays.  They will cover copays for all cholesterol lowering meds, including statins, fibrates, omega-3 oils, ezetimibe, Repatha, Praluent, Nexletol, Nexlizet.  The cards are usually good for $2,500 or 12 months, whichever comes first. 1. Go to healthwellfoundation.org 2. Click on "Apply Now" 3. Answer questions as to whom is applying (patient or representative) 4. Your disease fund will be "hypercholesterolemia - Medicare access" 5. They will ask questions about finances and which medications you are taking for cholesterol 6. When you submit, the approval is usually within minutes.  You will need to print the card information from the site 7. You will need to show this information to your pharmacy, they will bill your Medicare Part D plan first -then bill Health Well --for the copay.   a. ID number b. BIN number c. GRP number d. PCN number You can also call them at [phone], although the hold times can be quite long.   Thank you for choosing [facility]

## 2019-12-19 NOTE — Progress Notes (Signed)
  Chronic Care Management   Outreach Note  12/09/2020 Name: Colin Keller MRN: [id number] DOB: 01-08-1956  Referred by: Dana Allan, MD Reason for referral : Chronic Care Management  Follow Up Plan: Face to Face appointment with care management team member scheduled for: 12/17/2019  SIGNATURE Kieth Brightly, PharmD, BCPS Clinical Pharmacist, [facility] [place]  [facility]  Direct Dial: [phone]

## 2019-12-19 NOTE — Progress Notes (Signed)
12/19/2019 Colin Keller 10-25-1956 [id number]   HPI:  Colin Keller is a 64 y.o. male patient of Dr Stanford Breed, who presents today for a lipid clinic evaluation.  See pertinent past medical history below.  He was previously on Repatha when uninsured.  He was receiving this from [employer], but this was stopped in August when he turned 24 and became eligible for Medicare.  He has not had a dose in 4-5 months.    Today he reports feeling well overall.  He admits that Repatha and insulin injections all tend to sting, as he is quite lean.  In the past he has done injections in his thigh, as he usually doses his insulins in the abdomen.  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  Work is hit or miss, and has been more challenging during the holidays.  He notes that when he works his blood sugars tend to be better controlled.    Past Medical History: ASCVD S/p CABG x 3 Sept 2019  CKD Stage III  SCr 1.64 (11/2019)  DM2 On insulin, with neuropathy  hypertension Only on metoprolol tartrate  CHF Chronic diastolic heart failure (EF 50-55%)    Current Medications:  none  Cholesterol Goals: LDL < 70   Intolerant/previously tried: pravastatin 40, simvastatin 40 - both caused myalgias  Family history: father with heart failure, tobacco and alcohol abuse, mother with COPD  1 sister in good health, brother passed away from unknown causes (did have hypertension) at age 91  Diet: eats at home most meals; venison, fish, occasional rice, plenty of vegetables (fresh and frozen); does eat breakfast out when working, but not much recently  Exercise:  Active with job, (digging, painting, 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 acetaminophen (TYLENOL) 500 MG tablet Take 2 tablets (1,000 mg total) by mouth every 6 (six) hours as needed for mild pain. (Patient  taking differently: Take 1,000 mg by mouth at bedtime. ) 30 tablet 0  . aspirin 81 MG tablet Take 81 mg by mouth daily.    . Famotidine 20 MG CHEW Chew 20 mg by mouth daily as needed (for reflux).    . furosemide (LASIX) 40 MG tablet TAKE 1 TABLET BY MOUTH DAILY 30 tablet 3  . HYDROcodone-acetaminophen (NORCO) 10-325 MG tablet Take 1 tablet by mouth 2 (two) times daily. Take 1 tablet twice a day 60 tablet 0  . insulin aspart (NOVOLOG FLEXPEN) 100 UNIT/ML FlexPen Inject 4-6 Units into the skin 3 (three) times daily with meals. 2 pen 0  . insulin degludec (TRESIBA FLEXTOUCH) 100 UNIT/ML SOPN FlexTouch Pen Inject 0.14 mLs (14 Units total) into the skin every morning. (Patient taking differently: Inject 18 Units into the skin every morning. ) 1 pen 0  . Menthol, Topical Analgesic, (BIOFREEZE ROLL-ON) 4 % GEL Apply 1 application topically as needed (shoulder pain).    . metFORMIN (GLUCOPHAGE) 500 MG tablet Take 2 tablets (1,000 mg total) by mouth 2 (two) times daily with a meal. 180 tablet 3  . metoprolol tartrate (LOPRESSOR) 50 MG tablet TAKE 1 TABLET BY MOUTH TWICE DAILY 180 tablet 3  . tamsulosin (FLOMAX) 0.4 MG CAPS capsule TAKE 1 CAPSULE BY MOUTH DAILY 30 capsule 3  . Evolocumab (REPATHA SURECLICK) 643 MG/ML SOAJ Inject 140 mg into the skin every 14 (fourteen) days. (  Patient not taking: Reported on 12/19/2019) 2 pen 11   No current facility-administered medications for this visit.    Allergies  Allergen Reactions  . Amlodipine Swelling    Leg swelling  . Amitriptyline Other (See Comments)    Urinary retention  . Carvedilol Diarrhea and Other (See Comments)    GI discomfort and hip pain  . Cyclobenzaprine Other (See Comments)    Made patient "restless, twitchy"   . Nortriptyline Hcl Other (See Comments)    Vivid / bad dreams  . Simvastatin Other (See Comments)    Arthralgias    Past Medical History:  Diagnosis Date  . Acute diastolic heart failure (HCC) 08/18/2018  . CHF (congestive  heart failure) (HCC)   . CKD (chronic kidney disease), stage III   . Coronary artery disease   . Diabetes mellitus   . Dyspnea   . GERD (gastroesophageal reflux disease)   . Hx of CABG 08/23/2018   LIMA to LAD, RIMA to RCA, SVG to OM3, EVH via right thigh  . Hyperlipidemia   . Hypertension   . 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 Tobacco abuse   . Tobacco abuse disorder    Qualifier: Diagnosis of  By: Lafonda Mosses MD, Darl Pikes      Blood pressure (!) 146/70, pulse 63, resp. rate 15, height 5\' 11"  (1.803 m), weight 159 lb 9.6 oz (72.4 kg), SpO2 96 %.   Hyperlipidemia Patient with elevated LDL and history of CABG x 3 in 2019, not at goal.  Patient has had myalgias in the past with multiple statin drugs.  Was previously on Repatha, but was discontinued on free medication when he turned 63 last year.  Will apply to his new Medicare insurance plan for approval and patient was given information regarding [employer] Well foundation to pay his $90/month copay required by insurance.  He will repeat labs in March, after 4-5 doses.     April PharmD CPP [facility] [address] [place], [place] [phone]

## 2019-12-23 ENCOUNTER — Telehealth: Payer: Self-pay

## 2019-12-23 MED ORDER — REPATHA SURECLICK 140 MG/ML ~~LOC~~ SOAJ: 140.0000 mg | SUBCUTANEOUS | 11 refills | Status: DC

## 2019-12-23 NOTE — Telephone Encounter (Signed)
Called and lmomed the pt stating that the repatha was approved thru insurance, rx sent, pt instructed to apply by phone to the healthwell foundation.

## 2019-12-24 ENCOUNTER — Telehealth: Payer: Self-pay | Admitting: Family Medicine

## 2019-12-24 ENCOUNTER — Ambulatory Visit: Payer: PPO | Admitting: Pharmacist

## 2019-12-24 ENCOUNTER — Other Ambulatory Visit: Payer: Self-pay

## 2019-12-24 DIAGNOSIS — E785 Hyperlipidemia, unspecified: Secondary | ICD-10-CM

## 2019-12-24 DIAGNOSIS — E1165 Type 2 diabetes mellitus with hyperglycemia: Secondary | ICD-10-CM

## 2019-12-24 NOTE — Progress Notes (Signed)
Chronic Care Management    Visit Note  12/24/2019 Name: Colin Keller MRN: [id number] DOB: 02/13/1956  Referred by: Carollee Leitz, MD Reason for referral : Chronic Care Management (Diabetes)   Colin Keller is a 64 y.o. year old male who is a primary care patient of Carollee Leitz, MD. The CCM team was consulted for assistance with chronic disease management and care coordination needs related to DMII  Review of patient status, including review of consultants reports, relevant laboratory and other test results, and collaboration with appropriate care team members and the patient's provider was performed as part of comprehensive patient evaluation and provision of chronic care management services.    I spoke with Colin Keller by telephone today.  Medications: Outpatient Encounter Medications as of 12/24/2019  Medication Sig  . acetaminophen (TYLENOL) 500 MG tablet Take 2 tablets (1,000 mg total) by mouth every 6 (six) hours as needed for mild pain. (Patient taking differently: Take 1,000 mg by mouth at bedtime. )  . aspirin 81 MG tablet Take 81 mg by mouth daily.  . Evolocumab (REPATHA SURECLICK) 712 MG/ML SOAJ Inject 140 mg into the skin every 14 (fourteen) days.  . Famotidine 20 MG CHEW Chew 20 mg by mouth daily as needed (for reflux).  . furosemide (LASIX) 40 MG tablet TAKE 1 TABLET BY MOUTH DAILY  . HYDROcodone-acetaminophen (NORCO) 10-325 MG tablet Take 1 tablet by mouth 2 (two) times daily. Take 1 tablet twice a day  . insulin aspart (NOVOLOG FLEXPEN) 100 UNIT/ML FlexPen Inject 4-6 Units into the skin 3 (three) times daily with meals.  . insulin degludec (TRESIBA FLEXTOUCH) 100 UNIT/ML SOPN FlexTouch Pen Inject 0.14 mLs (14 Units total) into the skin every morning. (Patient taking differently: Inject 18 Units into the skin every morning. )  . Menthol, Topical Analgesic, (BIOFREEZE ROLL-ON) 4 % GEL Apply 1 application topically as needed (shoulder pain).  . metFORMIN (GLUCOPHAGE) 500 MG  tablet Take 2 tablets (1,000 mg total) by mouth 2 (two) times daily with a meal.  . metoprolol tartrate (LOPRESSOR) 50 MG tablet TAKE 1 TABLET BY MOUTH TWICE DAILY  . tamsulosin (FLOMAX) 0.4 MG CAPS capsule TAKE 1 CAPSULE BY MOUTH DAILY   No facility-administered encounter medications on file as of 12/24/2019.     Objective:   Goals Addressed            This Visit's Progress     Patient Stated   . I would like to manage my diabetes (pt-stated)       Current Barriers:  . Diabetes: W5YK; complicated by chronic medical conditions including HTN, HLD, CAD, HFpEF most recent A1c 11% on 06/06/19 . Current antihyperglycemic regimen: metformin 1 g BID, Tresiba (Insulin Degludec) 18 units daily. Novolog (Insulin Aspart) at a sliding scale of 4-6 units with breakfast and 4-6 units with dinner.  . Denies hypoglycemic symptoms (has experienced in the past, counseled on hypoglyemic) . Reports hyperglycemic symptoms, including: polyuria, polydipsia,  nocturia . Current meal patterns: o Breakfast: grits, bacon, eggs o Lunch:crackers o Supper: incorporates protein, veggie, low carb o Snacks: rare, does not like sweets o Drinks: avoids sugary drinks, water coffee . 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 Current blood glucose readings: FBGs 180-200; pt reports BG is consistently high; one reported FBG of 74 because he skipped dinner o Attributes high Bgs to lack of physical activity . Cardiovascular risk reduction: o Current hypertensive regimen: metoprolol BID o Current hyperlipidemia regimen: Repatha-starting this month (due for LDL,  seeing heart care PharmD for lipids) o Current antiplatelet regimen: ASA  Pharmacist Clinical Goal(s):  Marland Kitchen Over the next 90 days, patient will work with PharmD and primary care provider to address related to diabetes management  Interventions: . Comprehensive medication review performed, medication list updated in electronic medical record . Continue  Tresiba 18 units daily. Continue other medications as above o Applied for patient assistance to obtain Basaglar, Humalog via [employer] . Attempting to get SGLT2 added to regimen once barriers removed (financial).  Unable to apply for Jardiance of [name] based on patient's financial situation.  Will attempt to get Staglatro using special copay card (max savings $585 per RX)  Patient Self Care Activities:  . Patient will check blood glucose 3x daily, document, and provide at future appointments . Patient will focus on medication adherence  . Patient will take medications as prescribed . Patient will contact provider with any episodes of hypoglycemia . Patient will report any questions or concerns to provider   Please see past updates related to this goal by clicking on the "Past Updates" button in the selected goal          Plan:   The care management team will reach out to the patient again over the next 7 days.   Provider Signature Kieth Brightly, PharmD, BCPS Clinical Pharmacist, [facility] [place]  [facility]  Direct Dial: [phone]

## 2019-12-24 NOTE — Progress Notes (Signed)
Chronic Care Management   Initial Visit Note  12/17/2019 Name: Colin Keller MRN: [id number] DOB: July 29, 1956  Referred by: Carollee Leitz, MD Reason for referral : Chronic Care Management   Colin Keller is a 64 y.o. year old male who is a primary care patient of Carollee Leitz, MD. The CCM team was consulted for assistance with chronic disease management and care coordination needs related to DMII  Review of patient status, including review of consultants reports, relevant laboratory and other test results, and collaboration with appropriate care team members and the patient's provider was performed as part of comprehensive patient evaluation and provision of chronic care management services.    I met with [name] in clinic today  Medications: Outpatient Encounter Medications as of 12/17/2019  Medication Sig  . acetaminophen (TYLENOL) 500 MG tablet Take 2 tablets (1,000 mg total) by mouth every 6 (six) hours as needed for mild pain. (Patient taking differently: Take 1,000 mg by mouth at bedtime. )  . aspirin 81 MG tablet Take 81 mg by mouth daily.  . Famotidine 20 MG CHEW Chew 20 mg by mouth daily as needed (for reflux).  . furosemide (LASIX) 40 MG tablet TAKE 1 TABLET BY MOUTH DAILY  . HYDROcodone-acetaminophen (NORCO) 10-325 MG tablet Take 1 tablet by mouth 2 (two) times daily. Take 1 tablet twice a day  . insulin aspart (NOVOLOG FLEXPEN) 100 UNIT/ML FlexPen Inject 4-6 Units into the skin 3 (three) times daily with meals.  . insulin degludec (TRESIBA FLEXTOUCH) 100 UNIT/ML SOPN FlexTouch Pen Inject 0.14 mLs (14 Units total) into the skin every morning. (Patient taking differently: Inject 18 Units into the skin every morning. )  . Menthol, Topical Analgesic, (BIOFREEZE ROLL-ON) 4 % GEL Apply 1 application topically as needed (shoulder pain).  . metFORMIN (GLUCOPHAGE) 500 MG tablet Take 2 tablets (1,000 mg total) by mouth 2 (two) times daily with a meal.  . metoprolol tartrate (LOPRESSOR) 50  MG tablet TAKE 1 TABLET BY MOUTH TWICE DAILY  . tamsulosin (FLOMAX) 0.4 MG CAPS capsule TAKE 1 CAPSULE BY MOUTH DAILY  . [DISCONTINUED] Evolocumab (REPATHA SURECLICK) 094 MG/ML SOAJ Inject 140 mg into the skin every 14 (fourteen) days. (Patient not taking: Reported on 12/19/2019)   No facility-administered encounter medications on file as of 12/17/2019.     Objective:   Goals Addressed            This Visit's Progress     Patient Stated   . I would like to manage my diabetes (pt-stated)       Current Barriers:  . Diabetes: B0JG; complicated by chronic medical conditions including HTN, HLD, CAD, HFpEF most recent A1c 11% on 06/06/19 . Current antihyperglycemic regimen: metformin 1 g BID, Tresiba (Insulin Degludec) 16 units daily. Novolog (Insulin Aspart) at a sliding scale of 4-6 units with breakfast and 4-6 units with dinner.  . Denies hypoglycemic symptoms (has experienced in the past, counseled on hypoglyemic) . Reports hyperglycemic symptoms, including: polyuria, polydipsia,  nocturia . Current meal patterns: o Breakfast: grits, bacon, eggs o Lunch:crackers o Supper: incorporates protein, veggie, low carb o Snacks: rare, does not like sweets o Drinks: avoids sugary drinks, water coffee . 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 Current blood glucose readings: FBGs 200s, 300; denies lows--pt reports BG is consistently high . Cardiovascular risk reduction: o Current hypertensive regimen:  o Current hyperlipidemia regimen: Repatha o Current antiplatelet regimen: ASA  Pharmacist Clinical Goal(s):  Marland Kitchen Over the next 90 days,  patient will work with PharmD and primary care provider to address related to diabetes management  Interventions: . Comprehensive medication review performed, medication list updated in electronic medical record . Increase Tresbia to 18 units daily. Continue other medications as above o Applied for patient assistance to obtain Basaglar, Humalog via  [employer] . Attempting to get SGLT2 added to regimen once barriers removed (financial).  Unable to apply for Jardiance of [place] [employer]ade based on patient's financial situation.  Will attempt to get Staglatro using special copay card (max savings $585 per RX)  Patient Self Care Activities:  . Patient will check blood glucose 3x daily, document, and provide at future appointments . Patient will focus on medication adherence  . Patient will take medications as prescribed . Patient will contact provider with any episodes of hypoglycemia . Patient will report any questions or concerns to provider   Initial goal documentation        Plan:   The care management team will reach out to the patient again over the next 60 days.   Provider Signature Regina Eck, PharmD, BCPS Clinical Pharmacist, [facility] [place]: [phone]

## 2019-12-25 ENCOUNTER — Other Ambulatory Visit: Payer: Self-pay | Admitting: Nephrology

## 2019-12-25 ENCOUNTER — Other Ambulatory Visit: Payer: Self-pay | Admitting: Pharmacy Technician

## 2019-12-25 DIAGNOSIS — N1832 Chronic kidney disease, stage 3b: Secondary | ICD-10-CM

## 2019-12-25 NOTE — Patient Outreach (Signed)
[facility] Care Management  12/25/2019  Colin Keller 1956-12-05 [id number]   Received provider portion(s) of patient assistance application(s) for Humalog and Basaglar. Faxed completed application and required documents into [employer].  Will follow up with company(ies) in 7-10 business days to check status of application(s).  Suzan Slick Effie Shy CPhT Certified Pharmacy Technician [facility] Care Management Direct [phone]

## 2019-12-31 ENCOUNTER — Ambulatory Visit: Payer: PPO

## 2019-12-31 ENCOUNTER — Telehealth: Payer: PPO

## 2019-12-31 ENCOUNTER — Other Ambulatory Visit: Payer: Self-pay

## 2020-01-03 ENCOUNTER — Other Ambulatory Visit: Payer: Self-pay

## 2020-01-03 ENCOUNTER — Other Ambulatory Visit: Payer: Self-pay | Admitting: Family Medicine

## 2020-01-03 DIAGNOSIS — M545 Low back pain, unspecified: Secondary | ICD-10-CM

## 2020-01-03 DIAGNOSIS — G8929 Other chronic pain: Secondary | ICD-10-CM

## 2020-01-03 NOTE — Telephone Encounter (Signed)
Patients wife needs refill on patients Norco.

## 2020-01-06 NOTE — Telephone Encounter (Signed)
Wife is calling for her husbands refill on hydrocodone to be called in. Please inform pt once this is done. jw

## 2020-01-07 MED ORDER — HYDROCODONE-ACETAMINOPHEN 10-325 MG PO TABS: 1.0000 | ORAL_TABLET | Freq: Two times a day (BID) | ORAL | 0 refills | Status: DC

## 2020-01-07 NOTE — Telephone Encounter (Signed)
LVM that Rx has been sent to pharmacy. Sunday Spillers, CMA

## 2020-01-09 ENCOUNTER — Ambulatory Visit: Payer: Self-pay | Admitting: Pharmacist

## 2020-01-09 DIAGNOSIS — E1165 Type 2 diabetes mellitus with hyperglycemia: Secondary | ICD-10-CM

## 2020-01-10 NOTE — Progress Notes (Signed)
  Chronic Care Management   Outreach Note  01/09/2020 Name: Colin Keller MRN: [id number] DOB: [birth year]  Referred by: Dana Allan, MD Reason for referral : Chronic Care Management   An unsuccessful telephone outreach was attempted today. The patient was referred to the case management team by for assistance with care management and care coordination.   Follow Up Plan: A HIPPA compliant phone message was left for the patient providing contact information and requesting a return call.  The care management team will reach out to the patient again over the next 5-7 days.   SIGNATURE Kieth Brightly, PharmD, BCPS Clinical Pharmacist, [facility] [place]  [facility]  Direct Dial: [phone]

## 2020-01-14 ENCOUNTER — Other Ambulatory Visit: Payer: Self-pay | Admitting: Pharmacy Technician

## 2020-01-14 NOTE — Patient Outreach (Signed)
[facility]) Care Management  01/14/2020  AMARRION PASTORINO 1956/09/10 [id number]    Follow up call placed to [facility] regarding patient assistance application(s) for Humalog and Basaglar , Misty Stanley confirms patient has been approved as of 1/20 until 12/11/2020. She states medications were delivered to patients home on 1/27.  Follow up:  Will route note to [facility] Embedded RPh Raynelle Fanning to inform and will remive myself from care team.  Suzan Slick. Effie Shy CPhT Certified Pharmacy Technician [facility] Care Management Direct [phone]

## 2020-01-22 ENCOUNTER — Ambulatory Visit
Admission: RE | Admit: 2020-01-22 | Discharge: 2020-01-22 | Disposition: A | Discharge status: Home / Self Care | Payer: PPO | Source: Ambulatory Visit | Attending: Nephrology | Admitting: Nephrology

## 2020-01-22 DIAGNOSIS — N1832 Chronic kidney disease, stage 3b: Secondary | ICD-10-CM

## 2020-01-22 DIAGNOSIS — N189 Chronic kidney disease, unspecified: Secondary | ICD-10-CM | POA: Diagnosis not present

## 2020-02-03 ENCOUNTER — Other Ambulatory Visit: Payer: Self-pay

## 2020-02-03 DIAGNOSIS — M545 Low back pain, unspecified: Secondary | ICD-10-CM

## 2020-02-03 DIAGNOSIS — G8929 Other chronic pain: Secondary | ICD-10-CM

## 2020-02-04 ENCOUNTER — Ambulatory Visit: Payer: Self-pay | Admitting: Pharmacist

## 2020-02-04 DIAGNOSIS — E1165 Type 2 diabetes mellitus with hyperglycemia: Secondary | ICD-10-CM

## 2020-02-04 MED ORDER — HYDROCODONE-ACETAMINOPHEN 10-325 MG PO TABS: 1.0000 | ORAL_TABLET | Freq: Two times a day (BID) | ORAL | 0 refills | Status: DC

## 2020-02-04 NOTE — Progress Notes (Signed)
  Chronic Care Management   Outreach Note  02/04/2020 Name: Colin Keller MRN: [id number] DOB: [birth year]  Referred by: Dana Allan, MD Reason for referral : Chronic Care Management   A second unsuccessful telephone outreach was attempted today. The patient was referred to the case management team for assistance with care management and care coordination.   Follow Up Plan: A HIPPA compliant phone message was left for the patient providing contact information and requesting a return call.  The care management team will reach out to the patient again over the next 7-10 days.   SIGNATURE Kieth Brightly, PharmD, BCPS Clinical Pharmacist, [facility] [place]  [facility]  Direct Dial: [phone]

## 2020-02-19 ENCOUNTER — Other Ambulatory Visit: Payer: Self-pay | Admitting: Family Medicine

## 2020-02-19 DIAGNOSIS — I5033 Acute on chronic diastolic (congestive) heart failure: Secondary | ICD-10-CM

## 2020-03-03 ENCOUNTER — Other Ambulatory Visit: Payer: Self-pay

## 2020-03-03 DIAGNOSIS — E785 Hyperlipidemia, unspecified: Secondary | ICD-10-CM | POA: Diagnosis not present

## 2020-03-03 DIAGNOSIS — M545 Low back pain, unspecified: Secondary | ICD-10-CM

## 2020-03-03 DIAGNOSIS — I509 Heart failure, unspecified: Secondary | ICD-10-CM | POA: Diagnosis not present

## 2020-03-03 DIAGNOSIS — Z951 Presence of aortocoronary bypass graft: Secondary | ICD-10-CM | POA: Diagnosis not present

## 2020-03-03 DIAGNOSIS — I129 Hypertensive chronic kidney disease with stage 1 through stage 4 chronic kidney disease, or unspecified chronic kidney disease: Secondary | ICD-10-CM | POA: Diagnosis not present

## 2020-03-03 DIAGNOSIS — E1122 Type 2 diabetes mellitus with diabetic chronic kidney disease: Secondary | ICD-10-CM | POA: Diagnosis not present

## 2020-03-03 DIAGNOSIS — G8929 Other chronic pain: Secondary | ICD-10-CM

## 2020-03-03 DIAGNOSIS — N1832 Chronic kidney disease, stage 3b: Secondary | ICD-10-CM | POA: Diagnosis not present

## 2020-03-03 DIAGNOSIS — R809 Proteinuria, unspecified: Secondary | ICD-10-CM | POA: Diagnosis not present

## 2020-03-03 DIAGNOSIS — I2510 Atherosclerotic heart disease of native coronary artery without angina pectoris: Secondary | ICD-10-CM | POA: Diagnosis not present

## 2020-03-03 MED ORDER — HYDROCODONE-ACETAMINOPHEN 10-325 MG PO TABS: 1.0000 | ORAL_TABLET | Freq: Two times a day (BID) | ORAL | 0 refills | Status: DC

## 2020-03-05 ENCOUNTER — Telehealth: Payer: Self-pay

## 2020-03-05 NOTE — Telephone Encounter (Signed)
Patient's wife calls nurse line regarding refill of norco. Patient's wife requests that medication be dispensed on Saturday, 03/07/20, as pharmacy is closed on Sunday.   Called and spoke with pharmacist. Pharmacist states there is a note in system from provider that states to not fill before 03/08/20.   To PCP  Please advise  Veronda Prude, RN

## 2020-03-11 ENCOUNTER — Other Ambulatory Visit: Payer: Self-pay | Admitting: Family Medicine

## 2020-03-11 NOTE — Telephone Encounter (Signed)
done

## 2020-03-26 ENCOUNTER — Ambulatory Visit (INDEPENDENT_AMBULATORY_CARE_PROVIDER_SITE_OTHER): Payer: Medicare HMO | Admitting: Family Medicine

## 2020-03-26 ENCOUNTER — Encounter: Payer: Self-pay | Admitting: Family Medicine

## 2020-03-26 ENCOUNTER — Telehealth: Payer: Self-pay | Admitting: Family Medicine

## 2020-03-26 ENCOUNTER — Other Ambulatory Visit: Payer: Self-pay | Admitting: Family Medicine

## 2020-03-26 ENCOUNTER — Other Ambulatory Visit: Payer: Self-pay

## 2020-03-26 ENCOUNTER — Ambulatory Visit
Admission: RE | Admit: 2020-03-26 | Discharge: 2020-03-26 | Disposition: A | Discharge status: Home / Self Care | Payer: Medicare HMO | Source: Ambulatory Visit | Attending: Family Medicine | Admitting: Family Medicine

## 2020-03-26 VITALS — BP 142/70 | HR 66 | Ht 71.0 in | Wt 159.0 lb

## 2020-03-26 DIAGNOSIS — M542 Cervicalgia: Secondary | ICD-10-CM | POA: Diagnosis not present

## 2020-03-26 DIAGNOSIS — M546 Pain in thoracic spine: Secondary | ICD-10-CM

## 2020-03-26 DIAGNOSIS — S299XXA Unspecified injury of thorax, initial encounter: Secondary | ICD-10-CM | POA: Diagnosis not present

## 2020-03-26 DIAGNOSIS — N1832 Chronic kidney disease, stage 3b: Secondary | ICD-10-CM | POA: Diagnosis not present

## 2020-03-26 DIAGNOSIS — S161XXA Strain of muscle, fascia and tendon at neck level, initial encounter: Secondary | ICD-10-CM | POA: Diagnosis not present

## 2020-03-26 DIAGNOSIS — M62838 Other muscle spasm: Secondary | ICD-10-CM

## 2020-03-26 DIAGNOSIS — I129 Hypertensive chronic kidney disease with stage 1 through stage 4 chronic kidney disease, or unspecified chronic kidney disease: Secondary | ICD-10-CM | POA: Diagnosis not present

## 2020-03-26 MED ORDER — CYCLOBENZAPRINE HCL 10 MG PO TABS: 10.0000 mg | ORAL_TABLET | Freq: Three times a day (TID) | ORAL | 0 refills | Status: DC | PRN

## 2020-03-26 MED ORDER — HYDROCODONE-ACETAMINOPHEN 10-325 MG PO TABS: 1.0000 | ORAL_TABLET | Freq: Three times a day (TID) | ORAL | 0 refills | Status: DC | PRN

## 2020-03-26 MED ORDER — BACLOFEN 10 MG PO TABS: 10.0000 mg | ORAL_TABLET | Freq: Three times a day (TID) | ORAL | 0 refills | Status: DC | PRN

## 2020-03-26 NOTE — Telephone Encounter (Signed)
I spoke with Mr and Mrs Dondero. Mr Reede cannot afford the Baclofen. He recalls that the twitching he experienced with Flexeril was after taking for a couple years.  Will stop the baclofen. Will Rx Flexeril given patient's confidence in its effectiveness for him. I warned him about risk of sedation with Flexeril and not to drive or operate heavy equipment until he knows how it affects him.  Sent in Fleseril 10 mg TID prn #30.  RF 0.

## 2020-03-26 NOTE — Telephone Encounter (Signed)
Patients wife Lawson Fiscal is calling to ask when they call patient with results from xray's to please call his cell phone at [phone]

## 2020-03-26 NOTE — Patient Instructions (Signed)
I believe you have experienced an acute cervical spine and muscle strain.  To make sure there was no bone injury in your neck and upper back, please go for Xrays of your neck and upper back spines at [facility].  Dr Harveer Sadler sent in some additional Hydrocodone-Acetaminophen tablets and Baclofen to help with the acute pain.  Use alternating heat and ice applied to your neck and upper back. Use genetle stretch of neck touching chin to chest, turning head and trying to touch ear to shoulders.  Do not take the stretch past the point of pain starting.   Dr Xzayvion Vaeth sent in a referral to [facility], but you probably can make an appointment quicker yourself.  You can let their office know that a referral from a physician has been sent to them if that will help with medicare coverage of visits.   Watch for new weakness or numbness in you arms or legs. Also, watch for new difficulty peeing.  If these happen, please let our office know soon after it starts, or go to the emergency room.   Cervical Sprain  A cervical sprain is a stretch or tear in one or more of the tough, cord-like tissues that connect bones (ligaments) in the neck. Cervical sprains can range from mild to severe. Severe cervical sprains can cause the spinal bones (vertebrae) in the neck to be unstable. This can lead to spinal cord damage and can result in serious nervous system problems. The amount of time that it takes for a cervical sprain to get better depends on the cause and extent of the injury. Most cervical sprains heal in 4-6 weeks. What are the causes? Cervical sprains may be caused by an injury (trauma), such as from a motor vehicle accident, a fall, or sudden forward and backward whipping movement of the head and neck (whiplash injury). Mild cervical sprains may be caused by wear and tear over time, such as from poor posture, sitting in a chair that does not provide support, or looking up or down for long  periods of time. What increases the risk? The following factors may make you more likely to develop this condition:  Participating in activities that have a high risk of trauma to the neck. These include contact sports, auto racing, gymnastics, and diving.  Taking risks when driving or riding in a motor vehicle, such as speeding.  Having osteoarthritis of the spine.  Having poor strength and flexibility of the neck.  A previous neck injury.  Having poor posture.  Spending a lot of time in certain positions that put stress on the neck, such as sitting at a computer for long periods of time. What are the signs or symptoms? Symptoms of this condition include:  Pain, soreness, stiffness, tenderness, swelling, or a burning sensation in the front, back, or sides of the neck.  Sudden tightening of neck muscles that you cannot control (muscle spasms).  Pain in the shoulders or upper back.  Limited ability to move the neck.  Headache.  Dizziness.  Nausea.  Vomiting.  Weakness, numbness, or tingling in a hand or an arm. Symptoms may develop right away after injury, or they may develop over a few days. In some cases, symptoms may go away with treatment and return (recur) over time. How is this diagnosed? This condition may be diagnosed based on:  Your medical history.  Your symptoms.  Any recent injuries or known neck problems that you have, such as arthritis in the  neck.  A physical exam.  Imaging tests, such as: ? X-rays. ? MRI. ? CT scan. How is this treated? This condition is treated by resting and icing the injured area and doing physical therapy exercises. Depending on the severity of your condition, treatment may also include:  Keeping your neck in place (immobilized) for periods of time. This may be done using: ? A cervical collar. This supports your chin and the back of your head. ? A cervical traction device. This is a sling that holds up your head. This  removes weight and pressure from your neck, and it may help to relieve pain.  Medicines that help to relieve pain and inflammation.  Medicines that help to relax your muscles (muscle relaxants).  Surgery. This is rare. Follow these instructions at home: If you have a cervical collar:   Wear it as told by your health care provider. Do not remove the collar unless instructed by your health care provider.  Ask your health care provider before you make any adjustments to your collar.  If you have long hair, keep it outside of the collar.  Ask your health care provider if you can remove the collar for cleaning and bathing. If you are allowed to remove the collar for cleaning or bathing: ? Follow instructions from your health care provider about how to remove the collar safely. ? Clean the collar by wiping it with mild soap and water and drying it completely. ? If your collar has removable pads, remove them every 1-2 days and wash them by hand with soap and water. Let them air-dry completely before you put them back in the collar. ? Check your skin under the collar for irritation or sores. If you see any, tell your health care provider. Managing pain, stiffness, and swelling   If directed, use a cervical traction device as told by your health care provider.  If directed, apply heat to the affected area before you do your physical therapy or as often as told by your health care provider. Use the heat source that your health care provider recommends, such as a moist heat pack or a heating pad. ? Place a towel between your skin and the heat source. ? Leave the heat on for 20-30 minutes. ? Remove the heat if your skin turns bright red. This is especially important if you are unable to feel pain, heat, or cold. You may have a greater risk of getting burned.  If directed, put ice on the affected area: ? Put ice in a plastic bag. ? Place a towel between your skin and the bag. ? Leave the ice on  for 20 minutes, 2-3 times a day. Activity  Do not drive while wearing a cervical collar. If you do not have a cervical collar, ask your health care provider if it is safe to drive while your neck heals.  Do not drive or use heavy machinery while taking prescription pain medicine or muscle relaxants, unless your health care provider approves.  Do not lift anything that is heavier than 10 lb (4.5 kg) until your health care provider tells you that it is safe.  Rest as directed by your health care provider. Avoid positions and activities that make your symptoms worse. Ask your health care provider what activities are safe for you.  If physical therapy was prescribed, do exercises as told by your health care provider or physical therapist. General instructions  Take over-the-counter and prescription medicines only as told by your  health care provider.  Do not use any products that contain nicotine or tobacco, such as cigarettes and e-cigarettes. These can delay healing. If you need help quitting, ask your health care provider.  Keep all follow-up visits as told by your health care provider or physical therapist. This is important. How is this prevented? To prevent a cervical sprain from happening again:  Use and maintain good posture. Make any needed adjustments to your workstation to help you use good posture.  Exercise regularly as directed by your health care provider or physical therapist.  Avoid risky activities that may cause a cervical sprain. Contact a health care provider if:  You have symptoms that get worse or do not get better after 2 weeks of treatment.  You have pain that gets worse or does not get better with medicine.  You develop new, unexplained symptoms.  You have sores or irritated skin on your neck from wearing your cervical collar. Get help right away if:  You have severe pain.  You develop numbness, tingling, or weakness in any part of your body.  You  cannot move a part of your body (you have paralysis).  You have neck pain along with: ? Severe dizziness. ? Headache. Summary  A cervical sprain is a stretch or tear in one or more of the tough, cord-like tissues that connect bones (ligaments) in the neck.  Cervical sprains may be caused by an injury (trauma), such as from a motor vehicle accident, a fall, or sudden forward and backward whipping movement of the head and neck (whiplash injury).  Symptoms may develop right away after injury, or they may develop over a few days.  This condition is treated by resting and icing the injured area and doing physical therapy exercises. This information is not intended to replace advice given to you by your health care provider. Make sure you discuss any questions you have with your health care provider. Document Revised: 03/20/2019 Document Reviewed: 07/27/2016 Elsevier Patient Education  2020 [employer].

## 2020-03-26 NOTE — Addendum Note (Signed)
Addended byPerley Jain, [place] Hippert D on: 03/26/2020 05:30 PM   Modules accepted: Orders

## 2020-03-26 NOTE — Telephone Encounter (Signed)
Patients wife calls nurse stating the Baclofen called in today is ~26 with insurance. Patients wife stated if Flexeril is an option, that would be cheaper for them. Please advise. Will forward to provider who saw patient today.

## 2020-03-27 ENCOUNTER — Telehealth: Payer: Self-pay | Admitting: Family Medicine

## 2020-03-27 DIAGNOSIS — S161XXA Strain of muscle, fascia and tendon at neck level, initial encounter: Secondary | ICD-10-CM | POA: Insufficient documentation

## 2020-03-27 NOTE — Assessment & Plan Note (Signed)
New problem Xrays of cervical and thoracic spine obtained. No acute bony injuries on XRs Pre-existing degenerative joint and disc findings on XRs  No findings on hx or exam of neurologic injury.  Working explanations for now is acute cervical and thoracic muscle strains. Plan - Additional hydrocodone/apap 10/325 tablets for acute analgesic therapy - Initial Rx of Baclofen changed to Flexeril given lower cost and patient's prior benefitial response reported from past - Heat/ice, relative rest - Recommended that chiropractic therapies are useful for treatment of acute muscle spasm.  Referral made to [facility]. Review of symptoms/signs that should prompt immediate evaluation by healthcare provider, including limb weakness, limb numbness, chang ein ability to control voiding or defecation, or intractable pain. Mr Mcclane said he understood.  - Follow up scheduled with Mr Eubanks's PCP in 2-3 weeks.

## 2020-03-27 NOTE — Addendum Note (Signed)
Addended byPerley Jain, Dionte Blaustein D on: 03/27/2020 10:33 AM   Modules accepted: Level of Service

## 2020-03-27 NOTE — Telephone Encounter (Signed)
I notified Colin Keller that his DG XRs of neck and thoracic spine did not show fractures or dislocations.  

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
## 2020-03-31 ENCOUNTER — Other Ambulatory Visit: Payer: Self-pay | Admitting: Family Medicine

## 2020-03-31 NOTE — Telephone Encounter (Signed)
I spokw with patient's wife, Lawson Fiscal. They requested  a phone call to their pharmacy to release the #15 tablets of Norco 10 mg that I prescribed last Thursday.  I contacted his pharamcy and requested release of this #15 tablet Norco to pt.  The pharmacist said he would.

## 2020-03-31 NOTE — Telephone Encounter (Signed)
Will forward to pcp for medication refill.  Will ask McDiarmid to review chart if he can take patient onto his panel.  Takyra Cantrall,CMA

## 2020-03-31 NOTE — Telephone Encounter (Signed)
Pt is calling to get a refill on his pain medication that was prescribed by Dr. McDiarmid and would like to have Dr. McDiarmid as his PCP. Myriam Jacobson

## 2020-04-06 ENCOUNTER — Other Ambulatory Visit: Payer: Self-pay | Admitting: Family Medicine

## 2020-04-06 DIAGNOSIS — G8929 Other chronic pain: Secondary | ICD-10-CM

## 2020-04-06 DIAGNOSIS — M545 Low back pain, unspecified: Secondary | ICD-10-CM

## 2020-04-06 MED ORDER — HYDROCODONE-ACETAMINOPHEN 10-325 MG PO TABS: 1.0000 | ORAL_TABLET | Freq: Two times a day (BID) | ORAL | 0 refills | Status: DC

## 2020-04-06 NOTE — Telephone Encounter (Signed)
Patients wife is calling and would like to have patients norco refilled.
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
## 2020-04-09 ENCOUNTER — Ambulatory Visit: Payer: Self-pay | Admitting: Family Medicine

## 2020-04-09 ENCOUNTER — Telehealth: Payer: Self-pay

## 2020-04-09 MED ORDER — PRALUENT 150 MG/ML ~~LOC~~ SOAJ: 150.0000 mg | SUBCUTANEOUS | 11 refills | Status: DC

## 2020-04-09 NOTE — Telephone Encounter (Signed)
Called and spoke w/pt stated that they were approved for praluent instead of repatha due to insurance formulary change. Pt voiced understanding and was ok with switching. rx sent

## 2020-04-30 ENCOUNTER — Other Ambulatory Visit: Payer: Self-pay | Admitting: Family Medicine

## 2020-04-30 DIAGNOSIS — G8929 Other chronic pain: Secondary | ICD-10-CM

## 2020-04-30 DIAGNOSIS — M545 Low back pain, unspecified: Secondary | ICD-10-CM

## 2020-04-30 MED ORDER — HYDROCODONE-ACETAMINOPHEN 10-325 MG PO TABS: 1.0000 | ORAL_TABLET | Freq: Two times a day (BID) | ORAL | 0 refills | Status: DC

## 2020-04-30 NOTE — Telephone Encounter (Signed)
Patients wife called and would like to have patients hydrocodone refilled.

## 2020-05-01 ENCOUNTER — Telehealth: Payer: Self-pay

## 2020-05-01 NOTE — Telephone Encounter (Signed)
I spoke with [facility] pharmacy who agreed to release to Norco on 05/02/20 instead of Sunday 05/03/20

## 2020-05-01 NOTE — Telephone Encounter (Signed)
Patients wife calls nurse line stating the pharmacy will not release patients pain medication until 5/26. Lawson Fiscal stated by law they are to release up to 3 days sooner, which would be Sunday 5/23. However, their drug store is closed on Sunday, so they are requesting a fill date of 5/22. In order to do this, the provider must call the pharmacy to give early verbal, if appropriate. I advised Lawson Fiscal we are closing at noon for education, but will send to provider.

## 2020-05-01 NOTE — Telephone Encounter (Signed)
Please let patient know that pharmacy agreed to release his Norco on Saturday, 5/22

## 2020-05-04 NOTE — Telephone Encounter (Signed)
Pt aware. Jessic Standifer,CMA  

## 2020-05-07 ENCOUNTER — Encounter: Payer: Self-pay | Admitting: Family Medicine

## 2020-05-07 ENCOUNTER — Other Ambulatory Visit: Payer: Self-pay

## 2020-05-07 ENCOUNTER — Ambulatory Visit (INDEPENDENT_AMBULATORY_CARE_PROVIDER_SITE_OTHER): Payer: Medicare HMO | Admitting: Family Medicine

## 2020-05-07 VITALS — BP 144/72 | HR 75 | Ht 71.0 in | Wt 149.0 lb

## 2020-05-07 DIAGNOSIS — E1142 Type 2 diabetes mellitus with diabetic polyneuropathy: Secondary | ICD-10-CM

## 2020-05-07 DIAGNOSIS — Z0000 Encounter for general adult medical examination without abnormal findings: Secondary | ICD-10-CM | POA: Diagnosis not present

## 2020-05-07 DIAGNOSIS — I10 Essential (primary) hypertension: Secondary | ICD-10-CM | POA: Diagnosis not present

## 2020-05-07 DIAGNOSIS — E11319 Type 2 diabetes mellitus with unspecified diabetic retinopathy without macular edema: Secondary | ICD-10-CM | POA: Diagnosis not present

## 2020-05-07 DIAGNOSIS — M545 Low back pain: Secondary | ICD-10-CM

## 2020-05-07 DIAGNOSIS — G8929 Other chronic pain: Secondary | ICD-10-CM

## 2020-05-07 DIAGNOSIS — Z1211 Encounter for screening for malignant neoplasm of colon: Secondary | ICD-10-CM | POA: Diagnosis not present

## 2020-05-07 DIAGNOSIS — N1831 Chronic kidney disease, stage 3a: Secondary | ICD-10-CM

## 2020-05-07 DIAGNOSIS — E1165 Type 2 diabetes mellitus with hyperglycemia: Secondary | ICD-10-CM | POA: Diagnosis not present

## 2020-05-07 DIAGNOSIS — Z23 Encounter for immunization: Secondary | ICD-10-CM

## 2020-05-07 LAB — POCT GLYCOSYLATED HEMOGLOBIN (HGB A1C): HbA1c, POC (controlled diabetic range): 9.3 % — AB (ref 0.0–7.0)

## 2020-05-07 NOTE — Patient Instructions (Addendum)
Congratulation on your lower A1c of 9.5%, down from 11% last year.  Dr Gerrad Welker would like to get you below 8%.    A consultation has been arranged with Dr Raymondo Band, Ilda Basset. D., for further diabetes management and continuous glucose monitoring device fitting and instruction is for next Thursday, 05/14/20, at 9:30 am.   No change in your diabetes medications for now.  Await Dr. Macky Lower recommendation about these diabetes medications.   Referral has  been made to [facility] for diabetic eye and caterract exams.   Referral has been made to [facility], Dr Kathie Rhodes. [place], for colon cancer screening.    You received your Pneumonia vaccination, called Pneumovax, today.  We are checking your kidney function and electrolytes today in preparation for your visit with Dr Raymondo Band.

## 2020-05-08 ENCOUNTER — Encounter: Payer: Self-pay | Admitting: Family Medicine

## 2020-05-08 DIAGNOSIS — Z0000 Encounter for general adult medical examination without abnormal findings: Secondary | ICD-10-CM | POA: Insufficient documentation

## 2020-05-08 LAB — RENAL FUNCTION PANEL
Albumin: 4.6 g/dL (ref 3.8–4.8)
BUN/Creatinine Ratio: 28 — ABNORMAL HIGH (ref 10–24)
BUN: 52 mg/dL — ABNORMAL HIGH (ref 8–27)
CO2: 18 mmol/L — ABNORMAL LOW (ref 20–29)
Calcium: 10.1 mg/dL (ref 8.6–10.2)
Chloride: 104 mmol/L (ref 96–106)
Creatinine, Ser: 1.89 mg/dL — ABNORMAL HIGH (ref 0.76–1.27)
GFR calc Af Amer: 42 mL/min/{1.73_m2} — ABNORMAL LOW (ref >59)
GFR calc non Af Amer: 36 mL/min/{1.73_m2} — ABNORMAL LOW (ref >59)
Glucose: 397 mg/dL — ABNORMAL HIGH (ref 65–99)
Phosphorus: 3.1 mg/dL (ref 2.8–4.1)
Potassium: 6.5 mmol/L — ABNORMAL HIGH (ref 3.5–5.2)
Sodium: 136 mmol/L (ref 134–144)

## 2020-05-08 NOTE — Assessment & Plan Note (Addendum)
Established problem Controlled and allowing pt ability to complete ADLs and iADLs in efficient fashion.  No significant ADE identifies. No aberrant behaviors demonstrated Patient recently received RX for Norco 7.5 mg/325mg  tablets, #60 tablets, RF:0 No concerns upon reviewing PDMP  Continue opioid analgesic current therapy regiment.

## 2020-05-08 NOTE — Assessment & Plan Note (Signed)
Established problem Controlled Continue current Lisinopril, metoprolol and ASA 81 for known CAD

## 2020-05-08 NOTE — Assessment & Plan Note (Addendum)
Received Pneumovax today (63 yrs old)Referred for CRC screening colonoscopy today.  Needs AAA screen referral next visit. Needs Welcome to Medicare Visit

## 2020-05-08 NOTE — Assessment & Plan Note (Signed)
Established problem. Stable. BMP Latest Ref Rng & Units 05/07/2020 11/11/2019 07/05/2019  Glucose 65 - 99 mg/dL 615(H) 834(P) 77  BUN 8 - 27 mg/dL 73(H) 78(X) 78(E)  Creatinine 0.76 - 1.27 mg/dL 7.84(X) 2.82(K) 8.13(G)  BUN/Creat Ratio 10 - 24 28(H) 28(H) 19  Sodium 134 - 144 mmol/L 136 136 140  Potassium 3.5 - 5.2 mmol/L 6.5(H) 4.9 4.9  Chloride 96 - 106 mmol/L 104 100 104  CO2 20 - 29 mmol/L 18(L) 22 22  Calcium 8.6 - 10.2 mg/dL 87.1 9.7 9.6   SCr 1.9 is within range of 1.6 to 2.2 over last year. Continue ACE inhibitor therapy

## 2020-05-08 NOTE — Progress Notes (Signed)
Colin Keller is alone Sources of clinical information for visit is/are patient and past medical records. Nursing assessment for this office visit was reviewed with the patient for accuracy and revision.     Previous Report(s) Reviewed: historical medical records and lab reports  Depression screen [facility] 2/9 05/07/2020  Decreased Interest 0  Down, Depressed, Hopeless 0  PHQ - 2 Score 0  Some recent data might be hidden    Fall Risk  05/07/2020 03/26/2020 06/06/2019 06/06/2018 05/12/2018  Falls in the past year? 0 0 0 No No  Number falls in past yr: - - 0 - -  Follow up - - Falls evaluation completed - -    PHQ9 SCORE ONLY 05/07/2020 03/26/2020 07/05/2019  PHQ-9 Total Score 0 0 0    Adult vaccines due  Topic Date Due  . TETANUS/TDAP  10/02/2024    Health Maintenance Due  Topic Date Due  . COVID-19 Vaccine (1) Never done  . COLONOSCOPY  Never done  . OPHTHALMOLOGY EXAM  06/21/2018  . COLON CANCER SCREENING ANNUAL FOBT  01/22/2019  . FOOT EXAM  11/24/2019      History/P.E. limitations: none  Adult vaccines due  Topic Date Due  . TETANUS/TDAP  10/02/2024    Diabetes Health Maintenance Due  Topic Date Due  . OPHTHALMOLOGY EXAM  06/21/2018  . FOOT EXAM  11/24/2019  . HEMOGLOBIN A1C  11/07/2020    Health Maintenance Due  Topic Date Due  . COVID-19 Vaccine (1) Never done  . COLONOSCOPY  Never done  . OPHTHALMOLOGY EXAM  06/21/2018  . COLON CANCER SCREENING ANNUAL FOBT  01/22/2019  . FOOT EXAM  11/24/2019     Chief Complaint  Patient presents with  . Diabetes  . Back Pain    Diabetic foot exam was performed with the following findings:   No deformities, ulcerations, or other skin breakdown Normal sensation of 10g monofilament Intact posterior tibialis and dorsalis pedis pulses

## 2020-05-08 NOTE — Assessment & Plan Note (Signed)
Established problem that has improved.  Continue degludec 18 units qam and Novolog 4-6 units BID Continue metformin 1000 mg BID. Check CBG finger sticks four times a day. Record and bring record to ov.  Vitmin B12 level on lower end of normal, will check methymalonic acid serum level next ov.

## 2020-05-08 NOTE — Assessment & Plan Note (Signed)
Referral back to [facility]

## 2020-05-08 NOTE — Assessment & Plan Note (Signed)
Intact sensation

## 2020-05-14 ENCOUNTER — Encounter: Payer: Self-pay | Admitting: Pharmacist

## 2020-05-14 ENCOUNTER — Ambulatory Visit (INDEPENDENT_AMBULATORY_CARE_PROVIDER_SITE_OTHER): Payer: Medicare HMO | Admitting: Pharmacist

## 2020-05-14 ENCOUNTER — Other Ambulatory Visit: Payer: Self-pay

## 2020-05-14 DIAGNOSIS — E1165 Type 2 diabetes mellitus with hyperglycemia: Secondary | ICD-10-CM

## 2020-05-14 NOTE — Patient Instructions (Addendum)
It was a pleasure seeing you in clinic today!    Please remember... 1. Sensor/transmitter will last 14 days 2. Sensor should be applied to area away from waistband, scarring, tattoos, irritation, and bones. 3. Transmitter must be within 4-5 feet of receiver 4. Do a fingerstick blood glucose test if the sensor readings do not match how    you feel 5. Remove sensor prior to magnetic resonance imaging (MRI), computed tomography (CT) scan, or high-frequency electrical heat (diathermy) treatment. 6. Freestyle Libre may be worn through a Environmental education officer. It may not be exposed to an advanced Imaging Technology (AIT) body scanner (also called a millimeter wave scanner) or the baggage x-ray machine. Instead, ask for hand-wanding or full-body pat-down and visual inspection.  7. Doses of vitamin C (>500 mg) may cause false high readings. 8. Store sensor kit between 36 and 86 degrees Farenheit. Can be refrigerated within this temperature range.  Problems with Freestyle sticking? 1. Order Skin Tac from [facility]. Alcohol swab area you plan to administer Freestyle Libre then let dry. Once dry, apply Skin Tac in a circular motion (with a spot in the middle for sensor without skin tac) and let dry. Once dry you can apply Freestyle [place]!   Problems taking off Freestyle Libre? 1. Remember to try to shower/bathe before removing Freestyle Libre 2. Order Tac Away to help remove any extra adhesive left on your skin once you remove Freestyle Libre  Problems with irritation? 1. Before applying Freestyle Libre: Apply Nasacort nasal spray (can buy over the counter) to area you will apply Freestyle Libre --> alcohol swab --> Skin Tac --> Freestyle Libre 2. After applying Freestyle Libre: Apply hydrocortisone cream to area until redness resolves   [employer] 1. Customer Sales Support (Freestyle orders and general customer questions) Phone number: [phone] Monday -  Sunday  8 AM - 8 PM PST   2. [email or web address]

## 2020-05-14 NOTE — Assessment & Plan Note (Signed)
DM is not controlled likely due to suboptimal DM regimen and how long patient has had DM. Freestyle Libre 2.0 CGM placed on patient's left arm successfully. Unable to connect patient to [facility] to [employer] account due to how patient does not have an email address.   1. Medications:  a. Continue Basaglar 18 units/day b. Continue Novolog 4-6 units twice daily c. Continue metformin 500mg  BID 2. INITIATE Freestyle Libre 2.0 CGM a. Longstanding diagnosis of diabetes, checks blood glucose readings > 4x per day, treats with 3 insulin injections, and requires frequent adjustments to insulin regimen. This patient will be seen every six months, minimally, to assess adherence to their CGM regimen and diabetes treatment plan. b. Will set up patient with Libreview at follow up appt c. Faxed medicare standard written order form and chart notes for benefits investigation via [employer]. DME will follow up and contact patient within 1 week. Advised patient to contact [facility] office if he has not received a call. 3. Follow up with pharmacy team in 1 month 4. Next A1C anticipated 07/2020.

## 2020-05-14 NOTE — Progress Notes (Signed)
Reviewed: I agree with the documentation and management of Dr. Koval. 

## 2020-05-14 NOTE — Progress Notes (Signed)
S:     Chief Complaint  Patient presents with  . Medication Management    Diabetes - CGM Freestyle     Patient presents today for diabetes management and Freestyle Libre 2.0 application. PMH significant for T2DM, HTN, CAD s/p CABG x3, chronic diastolic heart failure, and CKD. He does not currently have an email address.   Insurance coverage/medication affordability: [employer]na Medicare  Patient reports adherence with medications.  Current diabetes medications include: Basaglar 18 units/day, Novolog 4-6 units twice daily, metformin '500mg'$  BID Prior diabetes medications include: [place]   Patient taking >500 mg Vitamin C: denies  Freestyle Libre 2.0 patient education Person(s)instructed: patient  Instruction: [place] patient education  Instruction: CGM overview and set-up 1. Button, touch screen, and icons 2. Power supply and recharging 3. Home screen 4. Date and time 5. Set BG target range: 70-250 mg/dL 6. Set alarm/alert tone  7. Interstitial vs. capillary blood glucose readings  8. When to verify sensor reading with fingerstick blood glucose  Sensor application -- sensor placed on back of left arm 1. Site selection and site prep with alcohol pad/Skin Tac 2. Sensor prep-sensor pack and sensor applicator 3. Starting the sensor: 1 hour warm up before BG readings available    Will ask for fingersticks the first 12 hours   4. Sensor change every 14 days and rotate site 5. Call [employer] customer service if sensor comes off before 14 days  Safety and Troubleshooting 1. Scan the sensor at least every 8 hours 2. When the "test BG" symbol appears, test fingerstick blood glucose prior to    making treatment decisions 3. Do a fingerstick blood glucose test if the sensor readings do not match how    you feel 4. Remove sensor prior for MRI or CT. Sensor may be damaged by exposure to    airport x-ray screening 5. Vitamin C may cause false high readings and  aspirin may cause false low     readings 6. Store sensor kit between 39 and 77 degrees. Can be refrigerated at this temp.  Contact information provided for [employer] customer service and/or trainer.  O:   Labs:   Vitals:   05/14/20 0959  BP: (!) 164/66  Pulse: 70  SpO2: 98%    Lab Results  Component Value Date   HGBA1C 9.3 (A) 05/07/2020   HGBA1C 11.0 (A) 06/06/2019   HGBA1C 10.6 (A) 02/04/2019    No results found for: CPEPTIDE     Component Value Date/Time   CHOL 184 09/02/2019 0838   TRIG 86 09/02/2019 0838   HDL 54 09/02/2019 0838   CHOLHDL 3.4 09/02/2019 0838   CHOLHDL 3.0 08/19/2018 0603   VLDL 14 08/19/2018 0603   LDLCALC 114 (H) 09/02/2019 0838   LDLDIRECT 105 09/19/2016 0922    No results found for: MICRALBCREAT  Assessment: DM is not controlled likely due to suboptimal DM regimen and how long patient has had DM. Freestyle Libre 2.0 CGM placed on patient's left arm successfully. Unable to connect patient to [facility] to [place] account due to how patient does not have an email address.  Plan: 1. Medications:  a. Continue Basaglar 18 units/day b. Continue Novolog 4-6 units twice daily c. Continue metformin '500mg'$  BID 2. INITIATE Freestyle Libre 2.0 CGM a. Longstanding diagnosis of diabetes, checks blood glucose readings > 4x per day, treats with 3 insulin injections, and requires frequent adjustments to insulin regimen. This patient will be seen every six months, minimally, to assess  adherence to their CGM regimen and diabetes treatment plan. b. Will set up patient with [employer] at follow up appt c. Faxed medicare standard written order form and chart notes for benefits investigation via [employer] [facility]. DME will follow up and contact patient within 1 week. Advised patient to contact [facility] office if he has not received a call. 3. Follow up with pharmacy team in 1 month 4. Next A1C anticipated 07/2020.   Written patient instructions provided.     This appointment required 60 minutes of patient care (this includes precharting, chart review, review of results, face-to-face care, etc.).

## 2020-05-19 DIAGNOSIS — E1165 Type 2 diabetes mellitus with hyperglycemia: Secondary | ICD-10-CM | POA: Diagnosis not present

## 2020-05-22 ENCOUNTER — Encounter: Payer: Self-pay | Admitting: Family Medicine

## 2020-05-25 NOTE — Progress Notes (Signed)
Reviewed. Patient has not had CRC screening.

## 2020-05-26 ENCOUNTER — Other Ambulatory Visit: Payer: Self-pay

## 2020-05-26 DIAGNOSIS — M545 Low back pain, unspecified: Secondary | ICD-10-CM

## 2020-05-26 DIAGNOSIS — G8929 Other chronic pain: Secondary | ICD-10-CM

## 2020-05-26 MED ORDER — HYDROCODONE-ACETAMINOPHEN 10-325 MG PO TABS: 1.0000 | ORAL_TABLET | Freq: Two times a day (BID) | ORAL | 0 refills | Status: DC

## 2020-06-16 ENCOUNTER — Encounter: Payer: Self-pay | Admitting: Pharmacist

## 2020-06-16 ENCOUNTER — Other Ambulatory Visit: Payer: Self-pay

## 2020-06-16 ENCOUNTER — Ambulatory Visit (INDEPENDENT_AMBULATORY_CARE_PROVIDER_SITE_OTHER): Payer: Medicare HMO | Admitting: Pharmacist

## 2020-06-16 DIAGNOSIS — E1165 Type 2 diabetes mellitus with hyperglycemia: Secondary | ICD-10-CM | POA: Diagnosis not present

## 2020-06-16 DIAGNOSIS — I5032 Chronic diastolic (congestive) heart failure: Secondary | ICD-10-CM

## 2020-06-16 NOTE — Progress Notes (Signed)
S:     Chief Complaint  Patient presents with  . Medication Management    Diabetes - Sherlyn Lees    Patient presents today for 1 month follow-up after Freestyle Libre 2.0 placement on 05/14/2020. PMH significant for T2DM (2013), CAD s/p CABG x3, chronic diastolic heart failure, CKD, HTN (2010).   Patient reports Diabetes was diagnosed in 2013.   Insurance coverage/medication affordability: [employer] Medicare  Medication adherence reported yes .   Current diabetes medications include: Insulin aspart 4-6 units TID with meals, Insulin Glargine 18 units qAM, metformin 500 mg 2 tab BID Previous diabetes medications: Tresiba Current hypertension medications include: metoprolol tartrate 50 mg BID, Lisinopril 10 mg Current hyperlipidemia medications include: Alirocumab 150 mg/mL q14d  Patient denies hypoglycemic events. Rare reading below 80. Patient expresses satisfaction with device as he's seen great improvement in symptomatic hypoglycemic events.  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 He is normal weight.  Cardiovascular:     Rate and Rhythm: Normal rate and regular rhythm.  Pulmonary:     Effort: Pulmonary effort is normal.  Musculoskeletal:        General: No swelling.     Right lower leg: No edema.     Left lower leg: No edema.     Comments: Swelling - none on diruetic daily  Neurological:     General: No focal deficit present.     Mental Status: He is alert. Mental status is at baseline.  Psychiatric:        Mood and Affect: Mood normal.        Behavior: Behavior normal.        Thought Content: Thought content normal.        Judgment: Judgment normal.    Review of Systems    Gastrointestinal: Positive for abdominal pain and nausea.  Musculoskeletal: Positive for joint pain.     Lab Results  Component Value Date   HGBA1C 9.3 (A) 05/07/2020   Vitals:   06/16/20 0948  BP: (!) 154/68  Pulse: 68  SpO2: 98%    Lipid Panel     Component Value Date/Time   CHOL 184 09/02/2019 0838   TRIG 86 09/02/2019 0838   HDL 54 09/02/2019 0838   CHOLHDL 3.4 09/02/2019 0838   CHOLHDL 3.0 08/19/2018 0603   VLDL 14 08/19/2018 0603   LDLCALC 114 (H) 09/02/2019 0838   LDLDIRECT 105 09/19/2016 0922    2 hour post-meal/random blood sugars: 240-260's. Estimated A1c 9.4% base on Libre. Libre readings showed elevated levels throughout night and early morning, levels drop in afternoon and before dinner.    Clinical Atherosclerotic Cardiovascular Disease (ASCVD): Yes  The ASCVD Risk score Denman George DC Jr., et al., 2013) failed to calculate for the following reasons:   The patient has a prior MI or stroke diagnosis    A/P: DM is not controlled likely due to suboptimal DM prandial regimen and longstanding T2DM (diagnosed 2013). Since Libre placement 05/14/20, patient has had fewer hypoglycemic events. CGM readings show elevated levels throughout the night into the morning despite administering 3-4 units of Novolog at dinner. 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  Medication adherence appears good. Patient is gaining confidence in adjusting short-acting insulin - reports skipping lunch-time dose due to low readings, but has not increased dose at dinner despite night-time spikes. Medications  Continue Basaglar 18 units/day  Continue Novolog 4-6 units twice daily. Instructed to take 5 or 6 units at dinner to address night/early morning spikes  Continue metformin 500 mg (2 tab) BID  Continue Freestyle Libre 2.0 CGM. Check BG   ASCVD risk - secondary prevention in patient with diabetes. Last LDL is  not controlled with LDL of 114.  - high intensity statin indicated however intolerance necessitates use of PSK9 therapy. Aspirin is indicated.  -Continued aspirin 81 mg  -Continued Alirocumab 150 mg/mL q14d   Hypertension longstanding and isolated systolic in office.  Currently taking lisinopril and metoprolol.  Blood pressure goal <= 130/80 mmHg. Medication adherence reported as excellent.    Heart failure: HF stable post CABG. No edema on furosemide 40 mg daily. Will contact Dr. McDiarmid on tapering off of furosemide or space out administration to minimize dehydration.  Written patient instructions provided.  Total time in face to face counseling 35 minutes.   Follow up Pharmacist in 1 month.   Patient seen with Filbert Schilder, PharmD Candidate.

## 2020-06-16 NOTE — Patient Instructions (Signed)
We encourage you to take a higher dose - around 4-5 units - at dinner time to help lower the night-time spike.

## 2020-06-16 NOTE — Progress Notes (Signed)
Reviewed: I agree with Dr. Koval's documentation and management. 

## 2020-06-16 NOTE — Assessment & Plan Note (Signed)
Heart failure: HF stable post CABG. No edema on furosemide 40 mg daily. Will contact Dr. McDiarmid on tapering off of furosemide or space out administration to minimize dehydration.

## 2020-06-16 NOTE — Assessment & Plan Note (Signed)
DM is not controlled likely due to suboptimal DM prandial regimen and longstanding T2DM (diagnosed 2013). Since Libre placement 05/14/20, patient has had fewer hypoglycemic events. CGM readings show elevated levels throughout the night into the morning despite administering 3-4 units of Novolog at dinner. 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  Medication adherence appears good. Patient is gaining confidence in adjusting short-acting insulin - reports skipping lunch-time dose due to low readings, but has not increased dose at dinner despite night-time spikes. Medications  Continue Basaglar 18 units/day  Continue Novolog 4-6 units twice daily. Instructed to take 5 or 6 units at dinner to address night/early morning spikes  Continue metformin 500 mg (2 tab) BID  Continue Freestyle Libre 2.0 CGM. Check BG

## 2020-06-18 DIAGNOSIS — E1165 Type 2 diabetes mellitus with hyperglycemia: Secondary | ICD-10-CM | POA: Diagnosis not present

## 2020-06-29 ENCOUNTER — Other Ambulatory Visit: Payer: Self-pay | Admitting: Family Medicine

## 2020-06-29 ENCOUNTER — Other Ambulatory Visit: Payer: Self-pay

## 2020-06-29 DIAGNOSIS — I5033 Acute on chronic diastolic (congestive) heart failure: Secondary | ICD-10-CM

## 2020-06-29 DIAGNOSIS — G8929 Other chronic pain: Secondary | ICD-10-CM

## 2020-06-29 DIAGNOSIS — M545 Low back pain, unspecified: Secondary | ICD-10-CM

## 2020-06-29 MED ORDER — HYDROCODONE-ACETAMINOPHEN 10-325 MG PO TABS: 1.0000 | ORAL_TABLET | Freq: Two times a day (BID) | ORAL | 0 refills | Status: DC

## 2020-07-19 DIAGNOSIS — E1165 Type 2 diabetes mellitus with hyperglycemia: Secondary | ICD-10-CM | POA: Diagnosis not present

## 2020-07-24 ENCOUNTER — Other Ambulatory Visit: Payer: Self-pay | Admitting: Family Medicine

## 2020-07-24 DIAGNOSIS — M545 Low back pain, unspecified: Secondary | ICD-10-CM

## 2020-07-24 DIAGNOSIS — G8929 Other chronic pain: Secondary | ICD-10-CM

## 2020-07-28 DIAGNOSIS — E1122 Type 2 diabetes mellitus with diabetic chronic kidney disease: Secondary | ICD-10-CM | POA: Diagnosis not present

## 2020-07-28 DIAGNOSIS — I129 Hypertensive chronic kidney disease with stage 1 through stage 4 chronic kidney disease, or unspecified chronic kidney disease: Secondary | ICD-10-CM | POA: Diagnosis not present

## 2020-07-28 DIAGNOSIS — I2510 Atherosclerotic heart disease of native coronary artery without angina pectoris: Secondary | ICD-10-CM | POA: Diagnosis not present

## 2020-07-28 DIAGNOSIS — D631 Anemia in chronic kidney disease: Secondary | ICD-10-CM | POA: Diagnosis not present

## 2020-07-28 DIAGNOSIS — N1832 Chronic kidney disease, stage 3b: Secondary | ICD-10-CM | POA: Diagnosis not present

## 2020-07-28 DIAGNOSIS — N2581 Secondary hyperparathyroidism of renal origin: Secondary | ICD-10-CM | POA: Diagnosis not present

## 2020-07-28 DIAGNOSIS — N189 Chronic kidney disease, unspecified: Secondary | ICD-10-CM | POA: Diagnosis not present

## 2020-07-28 DIAGNOSIS — Z951 Presence of aortocoronary bypass graft: Secondary | ICD-10-CM | POA: Diagnosis not present

## 2020-07-28 DIAGNOSIS — R809 Proteinuria, unspecified: Secondary | ICD-10-CM | POA: Diagnosis not present

## 2020-07-28 LAB — COMPREHENSIVE METABOLIC PANEL
Albumin: 4.4 (ref 3.5–5.0)
Calcium: 9.4 (ref 8.7–10.7)
GFR calc Af Amer: 59
GFR calc non Af Amer: 51

## 2020-07-28 LAB — IRON,TIBC AND FERRITIN PANEL
%SAT: 29
Ferritin: 75
TIBC: 268
UIBC: 191

## 2020-07-28 LAB — BASIC METABOLIC PANEL
BUN: 42 — AB (ref 4–21)
CO2: 28 — AB (ref 13–22)
Chloride: 103 (ref 99–108)
Creatinine: 1.4 — AB (ref 0.6–1.3)
Glucose: 346
Potassium: 5.2 (ref 3.4–5.3)
Sodium: 135 — AB (ref 137–147)

## 2020-07-28 LAB — CBC AND DIFFERENTIAL
HCT: 35 — AB (ref 41–53)
Hemoglobin: 11.7 — AB (ref 13.5–17.5)
Platelets: 272 (ref 150–399)
WBC: 8.6

## 2020-07-28 LAB — CBC: RBC: 3.65 — AB (ref 3.87–5.11)

## 2020-08-18 ENCOUNTER — Encounter: Payer: Self-pay | Admitting: Family Medicine

## 2020-08-18 ENCOUNTER — Other Ambulatory Visit: Payer: Self-pay | Admitting: Family Medicine

## 2020-08-18 DIAGNOSIS — I129 Hypertensive chronic kidney disease with stage 1 through stage 4 chronic kidney disease, or unspecified chronic kidney disease: Secondary | ICD-10-CM

## 2020-08-18 DIAGNOSIS — I6523 Occlusion and stenosis of bilateral carotid arteries: Secondary | ICD-10-CM

## 2020-08-18 DIAGNOSIS — I519 Heart disease, unspecified: Secondary | ICD-10-CM | POA: Insufficient documentation

## 2020-08-18 DIAGNOSIS — E1165 Type 2 diabetes mellitus with hyperglycemia: Secondary | ICD-10-CM

## 2020-08-18 DIAGNOSIS — R809 Proteinuria, unspecified: Secondary | ICD-10-CM | POA: Insufficient documentation

## 2020-08-18 DIAGNOSIS — I771 Stricture of artery: Secondary | ICD-10-CM

## 2020-08-18 DIAGNOSIS — K219 Gastro-esophageal reflux disease without esophagitis: Secondary | ICD-10-CM | POA: Insufficient documentation

## 2020-08-18 HISTORY — DX: Occlusion and stenosis of bilateral carotid arteries: I65.23

## 2020-08-18 HISTORY — DX: Hypertensive chronic kidney disease with stage 1 through stage 4 chronic kidney disease, or unspecified chronic kidney disease: I12.9

## 2020-08-18 HISTORY — DX: Heart disease, unspecified: I51.9

## 2020-08-18 HISTORY — DX: Proteinuria, unspecified: R80.9

## 2020-08-18 HISTORY — DX: Stricture of artery: I77.1

## 2020-08-18 LAB — PTH, INTACT: PTH, Intact: 67

## 2020-08-18 LAB — PHOSPHORUS: Phosphorus, Ser: 3.3

## 2020-08-19 DIAGNOSIS — I129 Hypertensive chronic kidney disease with stage 1 through stage 4 chronic kidney disease, or unspecified chronic kidney disease: Secondary | ICD-10-CM | POA: Diagnosis not present

## 2020-08-19 DIAGNOSIS — E1165 Type 2 diabetes mellitus with hyperglycemia: Secondary | ICD-10-CM | POA: Diagnosis not present

## 2020-08-20 ENCOUNTER — Other Ambulatory Visit: Payer: Self-pay | Admitting: Family Medicine

## 2020-08-20 DIAGNOSIS — M545 Low back pain, unspecified: Secondary | ICD-10-CM

## 2020-08-20 DIAGNOSIS — G8929 Other chronic pain: Secondary | ICD-10-CM

## 2020-08-26 ENCOUNTER — Other Ambulatory Visit: Payer: Self-pay | Admitting: Family Medicine

## 2020-08-26 DIAGNOSIS — N401 Enlarged prostate with lower urinary tract symptoms: Secondary | ICD-10-CM

## 2020-08-27 DIAGNOSIS — N400 Enlarged prostate without lower urinary tract symptoms: Secondary | ICD-10-CM | POA: Insufficient documentation

## 2020-09-01 ENCOUNTER — Telehealth: Payer: Self-pay

## 2020-09-01 ENCOUNTER — Ambulatory Visit (HOSPITAL_COMMUNITY)
Admission: RE | Admit: 2020-09-01 | Discharge: 2020-09-01 | Disposition: A | Discharge status: Home / Self Care | Payer: Medicare HMO | Source: Ambulatory Visit | Attending: Cardiovascular Disease | Admitting: Cardiovascular Disease

## 2020-09-01 ENCOUNTER — Other Ambulatory Visit: Payer: Self-pay

## 2020-09-01 ENCOUNTER — Encounter: Payer: Self-pay | Admitting: Family Medicine

## 2020-09-01 ENCOUNTER — Other Ambulatory Visit: Payer: Self-pay | Admitting: Family Medicine

## 2020-09-01 ENCOUNTER — Other Ambulatory Visit (HOSPITAL_COMMUNITY): Payer: Self-pay | Admitting: Cardiology

## 2020-09-01 DIAGNOSIS — I6523 Occlusion and stenosis of bilateral carotid arteries: Secondary | ICD-10-CM | POA: Diagnosis not present

## 2020-09-01 MED ORDER — PRALUENT 150 MG/ML ~~LOC~~ SOAJ: 150.0000 mg | SUBCUTANEOUS | 11 refills | Status: DC

## 2020-09-01 NOTE — Telephone Encounter (Signed)
Called and spoke w/pt stated that they are approved for praluent 150 and the pt voiced understanding

## 2020-09-08 ENCOUNTER — Other Ambulatory Visit: Payer: Self-pay | Admitting: Family Medicine

## 2020-09-17 ENCOUNTER — Other Ambulatory Visit: Payer: Self-pay | Admitting: Family Medicine

## 2020-09-17 DIAGNOSIS — M545 Low back pain, unspecified: Secondary | ICD-10-CM

## 2020-09-17 DIAGNOSIS — G8929 Other chronic pain: Secondary | ICD-10-CM

## 2020-09-19 DIAGNOSIS — E1165 Type 2 diabetes mellitus with hyperglycemia: Secondary | ICD-10-CM | POA: Diagnosis not present

## 2020-09-29 ENCOUNTER — Telehealth: Payer: Self-pay

## 2020-09-29 NOTE — Telephone Encounter (Signed)
Mailed re-enrollment [employer] Patient Assistance Program application ([employer], Hospital doctor) to patient today with instructions to complete patient portion and return.

## 2020-10-16 ENCOUNTER — Other Ambulatory Visit: Payer: Self-pay

## 2020-10-16 DIAGNOSIS — G8929 Other chronic pain: Secondary | ICD-10-CM

## 2020-10-16 DIAGNOSIS — M545 Low back pain, unspecified: Secondary | ICD-10-CM

## 2020-10-16 MED ORDER — HYDROCODONE-ACETAMINOPHEN 10-325 MG PO TABS: 1.0000 | ORAL_TABLET | Freq: Two times a day (BID) | ORAL | 0 refills | Status: DC

## 2020-10-16 NOTE — Telephone Encounter (Signed)
Wife calls nurse line stating patients pain meds are due on Sunday, however the drug store is closed on Sundays. Lawson Fiscal reports Colin Keller fills hers early if her refill date falls on a Sunday. Please fill by 11/6 if appropriate.

## 2020-10-20 DIAGNOSIS — E1165 Type 2 diabetes mellitus with hyperglycemia: Secondary | ICD-10-CM | POA: Diagnosis not present

## 2020-11-16 ENCOUNTER — Other Ambulatory Visit: Payer: Self-pay | Admitting: Family Medicine

## 2020-11-16 DIAGNOSIS — M545 Low back pain, unspecified: Secondary | ICD-10-CM

## 2020-11-16 DIAGNOSIS — G8929 Other chronic pain: Secondary | ICD-10-CM

## 2020-11-20 ENCOUNTER — Telehealth: Payer: Self-pay | Admitting: Pharmacist

## 2020-11-20 DIAGNOSIS — E1165 Type 2 diabetes mellitus with hyperglycemia: Secondary | ICD-10-CM | POA: Diagnosis not present

## 2020-11-20 NOTE — Telephone Encounter (Signed)
I left voicemail for patient calling to see if he is still using his Freestyle CGM.

## 2020-11-23 ENCOUNTER — Telehealth: Payer: Self-pay | Admitting: Pharmacist

## 2020-11-23 ENCOUNTER — Other Ambulatory Visit: Payer: Self-pay | Admitting: Cardiology

## 2020-11-23 NOTE — Telephone Encounter (Signed)
Patient returned call request for him to let us know of his CGM use Josephine Igo) on 11/20/20 by Cheral Almas.   Patient reports he continues to use CGM at this time.  He reports that his blood sugar is "not doing real great".  When asked what this may be due to, he replied that he was unsure.    He reports taking his insulin as prescribed previously.  He is using a sliding scale rapid acting insulin with meals.   He plans to see Dr. Perley Jain on Thursday.  It is possible that we may be able to review some potential adjustments at that visit.  If we are unable to meet with him on 12/16, I asked him to schedule a separate visit next month.  He was agreeable to schedule a CGM focused follow-up visit with me if his blood glucose remains suboptimally controlled.

## 2020-11-23 NOTE — Telephone Encounter (Signed)
Noted and agree. 

## 2020-12-02 ENCOUNTER — Telehealth: Payer: Self-pay | Admitting: Cardiology

## 2020-12-02 NOTE — Telephone Encounter (Signed)
Spoke with pt. He state he believe it was his primary care who requested a copy of his medication list. Pt state he will call their office to clarify.

## 2020-12-02 NOTE — Telephone Encounter (Signed)
    Pt is calling, he said he received a text from Dr. Ludwig Clarks nurse asking for his medications list. He said he doesn't have a list but he is taking all medications that Dr. Ludwig Clarks prescribed to him.

## 2020-12-09 ENCOUNTER — Other Ambulatory Visit: Payer: Self-pay | Admitting: Family Medicine

## 2020-12-09 DIAGNOSIS — M62838 Other muscle spasm: Secondary | ICD-10-CM

## 2020-12-10 ENCOUNTER — Other Ambulatory Visit: Payer: Self-pay

## 2020-12-10 ENCOUNTER — Ambulatory Visit (INDEPENDENT_AMBULATORY_CARE_PROVIDER_SITE_OTHER): Payer: Medicare HMO | Admitting: Family Medicine

## 2020-12-10 ENCOUNTER — Encounter: Payer: Self-pay | Admitting: Family Medicine

## 2020-12-10 VITALS — BP 160/54 | HR 61 | Ht 71.0 in | Wt 156.2 lb

## 2020-12-10 DIAGNOSIS — E1169 Type 2 diabetes mellitus with other specified complication: Secondary | ICD-10-CM

## 2020-12-10 DIAGNOSIS — E785 Hyperlipidemia, unspecified: Secondary | ICD-10-CM | POA: Diagnosis not present

## 2020-12-10 DIAGNOSIS — N1831 Chronic kidney disease, stage 3a: Secondary | ICD-10-CM | POA: Diagnosis not present

## 2020-12-10 DIAGNOSIS — M7918 Myalgia, other site: Secondary | ICD-10-CM

## 2020-12-10 DIAGNOSIS — E1165 Type 2 diabetes mellitus with hyperglycemia: Secondary | ICD-10-CM | POA: Diagnosis not present

## 2020-12-10 DIAGNOSIS — Z23 Encounter for immunization: Secondary | ICD-10-CM | POA: Diagnosis not present

## 2020-12-10 DIAGNOSIS — E1159 Type 2 diabetes mellitus with other circulatory complications: Secondary | ICD-10-CM | POA: Diagnosis not present

## 2020-12-10 DIAGNOSIS — I152 Hypertension secondary to endocrine disorders: Secondary | ICD-10-CM | POA: Diagnosis not present

## 2020-12-10 LAB — POCT GLYCOSYLATED HEMOGLOBIN (HGB A1C): Hemoglobin A1C: 10.7 % — AB (ref 4.0–5.6)

## 2020-12-10 MED ORDER — DAPAGLIFLOZIN PROPANEDIOL 10 MG PO TABS: 10.0000 mg | ORAL_TABLET | Freq: Every day | ORAL | 3 refills | Status: DC

## 2020-12-10 MED ORDER — TIZANIDINE HCL 2 MG PO CAPS: 2.0000 mg | ORAL_CAPSULE | Freq: Three times a day (TID) | ORAL | 0 refills | Status: DC

## 2020-12-10 NOTE — Patient Instructions (Signed)
Your A1c is 10.7% which is up from 9.3 in May.   Please increase the basaglar to 20 units each morning. Start Farxiga 10 mg by mouth in the morning to help pee out sugars, lower your blood pressure, and decrease the risk of heart failure requiring going to the hospital. Only take Lasix if you notice swelling in your legs.

## 2020-12-10 NOTE — Progress Notes (Addendum)
CPT E&M Office Visit Time Before Visit; reviewing medical records (e.g. recent visits, labs, studies): 8 minutes During Visit (F2F time): 20 minutes After Visit (discussion with family or HCP, prescribing, ordering, referring, calling result/recommendations or documenting on same day): 9 minutes Total Visit Time: 37 minutes  Colin Keller is alone Sources of clinical information for visit is/are patient. Nursing assessment for this office visit was reviewed with the patient for accuracy and revision.     Previous Report(s) Reviewed: [facility] Card ov notes, labs  Depression screen [facility] 2/9 12/10/2020  Decreased Interest 0  Down, Depressed, Hopeless 0  PHQ - 2 Score 0  Altered sleeping 3  Tired, decreased energy 1  Change in appetite 0  Feeling bad or failure about yourself  0  Trouble concentrating 1  Moving slowly or fidgety/restless 0  Suicidal thoughts 0  PHQ-9 Score 5  Difficult doing work/chores Not difficult at all  Some recent data might be hidden    Fall Risk  05/07/2020 03/26/2020 06/06/2019 06/06/2018 05/12/2018  Falls in the past year? 0 0 0 No No  Number falls in past yr: - - 0 - -  Follow up - - Falls evaluation completed - -    PHQ9 SCORE ONLY 12/10/2020 05/07/2020 03/26/2020  PHQ-9 Total Score 5 0 0    Adult vaccines due  Topic Date Due  . TETANUS/TDAP  10/02/2024    Health Maintenance Due  Topic Date Due  . COLONOSCOPY (Pts 45-90yrs Insurance coverage will need to be confirmed)  Never done  . OPHTHALMOLOGY EXAM  06/21/2018  . COLON CANCER SCREENING ANNUAL FOBT  01/22/2019  . FOOT EXAM  11/24/2019  . COVID-19 Vaccine (3 - Booster for Pfizer series) 07/30/2020      History/P.E. limitations: none  Adult vaccines due  Topic Date Due  . TETANUS/TDAP  10/02/2024    Diabetes Health Maintenance Due  Topic Date Due  . OPHTHALMOLOGY EXAM  06/21/2018  . FOOT EXAM  11/24/2019  . HEMOGLOBIN A1C  06/10/2021    Health Maintenance Due  Topic Date Due  .  COLONOSCOPY (Pts 45-78yrs Insurance coverage will need to be confirmed)  Never done  . OPHTHALMOLOGY EXAM  06/21/2018  . COLON CANCER SCREENING ANNUAL FOBT  01/22/2019  . FOOT EXAM  11/24/2019  . COVID-19 Vaccine (3 - Booster for Pfizer series) 07/30/2020     Chief Complaint  Patient presents with  . Follow-up

## 2020-12-11 LAB — BASIC METABOLIC PANEL
BUN/Creatinine Ratio: 19 (ref 10–24)
BUN: 29 mg/dL — ABNORMAL HIGH (ref 8–27)
CO2: 22 mmol/L (ref 20–29)
Calcium: 9.4 mg/dL (ref 8.6–10.2)
Chloride: 105 mmol/L (ref 96–106)
Creatinine, Ser: 1.5 mg/dL — ABNORMAL HIGH (ref 0.76–1.27)
GFR calc Af Amer: 55 mL/min/{1.73_m2} — ABNORMAL LOW (ref >59)
GFR calc non Af Amer: 48 mL/min/{1.73_m2} — ABNORMAL LOW (ref >59)
Glucose: 297 mg/dL — ABNORMAL HIGH (ref 65–99)
Potassium: 4.8 mmol/L (ref 3.5–5.2)
Sodium: 140 mmol/L (ref 134–144)

## 2020-12-13 NOTE — Assessment & Plan Note (Addendum)
Established problem Uncontrolled Start: dapagliflozin 10 mg qam           Consider increase of hydralzaine dose.  Pt wants to  Discuss with his renal physicians before doing this Continue Hydralazine 25 mg BID, lisinopril-hctz 19-12.5 daily, metoprolol tartrate 50 mg bid Check and record BP at home RTC 1 month

## 2020-12-13 NOTE — Assessment & Plan Note (Signed)
Established problem Uncontrolled Intolerant of multiple statins with myalgias.  Did well on Repatha, but stopped when turned 63 bc he of enrollment in Medicare.   Pt has not restarted Repatha at [facility].  He will check with his current Medicare Adva ntage plan to see if his coverage for Repatha. It appears that [facility] had asked patient in 12/2019 to apply to [employer] for assistance with the Repatha's monthly co-pay last Medicare season.  We sent a request to [facility] to remind Mr Colin Keller what he needs to do to restart his Repatha.

## 2020-12-13 NOTE — Addendum Note (Signed)
Addended byPerley Jain, Chibuike Fleek D on: 12/13/2020 03:52 PM   Modules accepted: Orders

## 2020-12-13 NOTE — Assessment & Plan Note (Addendum)
Lab Results  Component Value Date   HGBA1C 10.7 (A) 12/10/2020   HGBA1C 9.3 (A) 05/07/2020   HGBA1C 11.0 (A) 06/06/2019   Lab Results  Component Value Date   LDLCALC 114 (H) 09/02/2019   CREATININE 1.50 (H) 12/10/2020  GFR-EPI = 51 mL/min Review of FreeStyle Libre shows most lC BG higher than 180 in evening and early mornings with lowests in early afternoons Current diabetes regimen: Basaglar 18 units with breakfast; Novolog 4-6 units 2 times a day, most with bfst and evening meals, he will take additional Novolog usually twice a day in late morning a around bedtime for hCBgs times daily with meals- dosage again 4-6 units;  andmetformin 1000 mg BID   Established problem Uncontrolled No new end-organ injuries Start:  Farxiga 10 mg in morning Stop Lasix daily, only use prn leg edema Change Basaglar to 20 units qam  Continue: Novologu, but with each meal, may continue prn post-prandial Would like Mr Perlie Mayo to  Schedule appointment with PHARMACY CLINIC IN 2 WEEKS TO ASSIST WITH IMPROVING HIS GLYCEMIC CONTrOL RTC see me in 4 weeks.

## 2020-12-14 ENCOUNTER — Other Ambulatory Visit: Payer: Self-pay | Admitting: Family Medicine

## 2020-12-14 DIAGNOSIS — G8929 Other chronic pain: Secondary | ICD-10-CM

## 2020-12-14 DIAGNOSIS — M545 Low back pain, unspecified: Secondary | ICD-10-CM

## 2020-12-21 DIAGNOSIS — E1165 Type 2 diabetes mellitus with hyperglycemia: Secondary | ICD-10-CM | POA: Diagnosis not present

## 2020-12-22 ENCOUNTER — Other Ambulatory Visit: Payer: Self-pay | Admitting: Pharmacist Clinician (PhC)/ Clinical Pharmacy Specialist

## 2020-12-22 ENCOUNTER — Telehealth: Payer: Self-pay

## 2020-12-22 ENCOUNTER — Encounter: Payer: Self-pay | Admitting: Cardiology

## 2020-12-22 ENCOUNTER — Telehealth (INDEPENDENT_AMBULATORY_CARE_PROVIDER_SITE_OTHER): Payer: Medicare HMO | Admitting: Cardiology

## 2020-12-22 VITALS — BP 153/63 | HR 61 | Ht 71.0 in | Wt 155.0 lb

## 2020-12-22 DIAGNOSIS — Z951 Presence of aortocoronary bypass graft: Secondary | ICD-10-CM

## 2020-12-22 DIAGNOSIS — E1159 Type 2 diabetes mellitus with other circulatory complications: Secondary | ICD-10-CM

## 2020-12-22 DIAGNOSIS — E119 Type 2 diabetes mellitus without complications: Secondary | ICD-10-CM

## 2020-12-22 DIAGNOSIS — Z794 Long term (current) use of insulin: Secondary | ICD-10-CM

## 2020-12-22 DIAGNOSIS — I152 Hypertension secondary to endocrine disorders: Secondary | ICD-10-CM

## 2020-12-22 DIAGNOSIS — I771 Stricture of artery: Secondary | ICD-10-CM

## 2020-12-22 DIAGNOSIS — E785 Hyperlipidemia, unspecified: Secondary | ICD-10-CM

## 2020-12-22 HISTORY — DX: Type 2 diabetes mellitus without complications: E11.9

## 2020-12-22 HISTORY — DX: Type 2 diabetes mellitus without complications: Z79.4

## 2020-12-22 MED ORDER — PRALUENT 150 MG/ML ~~LOC~~ SOAJ: 150.0000 mg | SUBCUTANEOUS | 12 refills | Status: DC

## 2020-12-22 NOTE — Patient Instructions (Signed)

## 2020-12-22 NOTE — Assessment & Plan Note (Signed)
Followed by PCP

## 2020-12-22 NOTE — Progress Notes (Signed)
Virtual Visit via Telephone Note   This visit type was conducted due to national recommendations for restrictions regarding the COVID-19 Pandemic (e.g. social distancing) in an effort to limit this patient's exposure and mitigate transmission in our community.  Due to his co-morbid illnesses, this patient is at least at moderate risk for complications without adequate follow up.  This format is felt to be most appropriate for this patient at this time.  The patient did not have access to video technology/had technical difficulties with video requiring transitioning to audio format only (telephone).  All issues noted in this document were discussed and addressed.  No physical exam could be performed with this format.  Please refer to the patient's chart for his  consent to telehealth for [facility].    Date:  12/22/2020   ID:  Colin Keller, DOB [birth year], MRN [id number] The patient was identified using 2 identifiers.  Patient Location: Home Provider Location: Home Office  PCP:  McDiarmid, Leighton Roach, MD  Cardiologist:  Olga Millers, MD  Electrophysiologist:  None   Evaluation Performed:  Follow-Up Visit  Chief Complaint:  non  History of Present Illness:    Colin Keller is a 65 y.o. male with a hx of coronary artery disease, status post CABG in 2019.  His ejection fraction was 50%.  He has done well from a cardiac standpoint since.  Other medical problems include treated hypertension, insulin-dependent diabetes, dyslipidemia with a history of statin intolerance, and vascular disease with moderate bilateral subclavian artery stenosis.  Patient was contacted for a 1 year follow-up.  Since we saw him last he says he has done well from a cardiac standpoint, he has not had chest pain.  He has had no issues with his medications although he says that he had been on Repatha at one time but later this was changed to Praluent and he no longer receives this in the mail.  He is not sure why this  is.  The patient does not have symptoms concerning for COVID-19 infection (fever, chills, cough, or new shortness of breath).    Past Medical History:  Diagnosis Date  . Acute diastolic heart failure (HCC) 08/18/2018  . Atherosclerosis of both carotid arteries 08/18/2020   Carotid dopplers 08/2019 40-59% bilateral stenosis and right subclavian stenosis  . CHF (congestive heart failure) (HCC)   . Chronic low back pain without sciatica 10/16/2008   H/o chronic LBP with h/o lumbar disc herniation and intermittent radicular pain  Chronic pain, on stable doses of Norco 10/325 TID. MRI in 2003 showing SMALL CENTRAL DISC HERNIATION AT L4-5.  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     . CKD (chronic kidney disease), stage III (HCC)   . Coronary artery disease   . Diabetes mellitus   . Dyspnea   . GERD (gastroesophageal reflux disease)   . Hx of CABG 08/23/2018   LIMA to LAD, RIMA to RCA, SVG to OM3, EVH via right thigh  . Hyperlipidemia   . Hypertension   . Hypertension associated with diabetes (HCC) 12/22/2008   Qualifier: Diagnosis of  By: Lafonda Mosses MD, Darl Pikes    . Hypertensive nephropathy 08/18/2020  . Proteinuria 08/18/2020  . 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 Subclavian artery stenosis, right (HCC) 08/18/2020   Carotid dopplers 08/2019 40-59% bilateral stenosis and right subclavian stenosis  . Tobacco abuse   .  Tobacco abuse disorder    Qualifier: Diagnosis of  By: Lafonda Mosses MD, Darl Pikes     Past Surgical History:  Procedure Laterality Date  . CORONARY ARTERY BYPASS GRAFT N/A 08/23/2018   Procedure: CORONARY ARTERY BYPASS GRAFTING (CABG) x 3; -Left Internal Mammary Artery to Left Anterior Descending Artery, -Right Internal Mammary Artery to Right Coronary Artery, -Saphenous Vein Graft to Obtuse Marginal;  ENDOSCOPIC HARVEST GREATER SAPHENOUS VEIN  -Right Thigh;  Surgeon: Purcell Nails, MD;  Location: [facility] OR;  Service: Open  Heart Surgery;  Laterality: N/A;  . INTRAVASCULAR PRESSURE WIRE/FFR STUDY N/A 08/20/2018   Procedure: INTRAVASCULAR PRESSURE WIRE/FFR STUDY;  Surgeon: Tonny Bollman, MD;  Location: [facility] INVASIVE CV LAB;  Service: Cardiovascular;  Laterality: N/A;  . LEFT HEART CATH AND CORONARY ANGIOGRAPHY N/A 08/20/2018   Procedure: LEFT HEART CATH AND CORONARY ANGIOGRAPHY;  Surgeon: Tonny Bollman, MD;  Location: [facility] INVASIVE CV LAB;  Service: Cardiovascular;  Laterality: N/A;  . TEE WITHOUT CARDIOVERSION N/A 08/23/2018   Procedure: TRANSESOPHAGEAL ECHOCARDIOGRAM (TEE);  Surgeon: Purcell Nails, MD;  Location: [facility] OR;  Service: Open Heart Surgery;  Laterality: N/A;     Current Meds  Medication Sig  . aspirin 81 MG tablet Take 81 mg by mouth daily.  . dapagliflozin propanediol (FARXIGA) 10 MG TABS tablet Take 1 tablet (10 mg total) by mouth daily.  . Famotidine 20 MG CHEW Chew 20 mg by mouth daily as needed (for reflux).  . hydrALAZINE (APRESOLINE) 25 MG tablet Take 25 mg by mouth 2 (two) times daily.  Marland Kitchen HYDROcodone-acetaminophen (NORCO) 10-325 MG tablet TAKE 1 TABLET BY MOUTH TWICE DAILY  . insulin aspart (NOVOLOG FLEXPEN) 100 UNIT/ML FlexPen Inject 4-6 Units into the skin 3 (three) times daily with meals.  . Insulin Glargine (BASAGLAR KWIKPEN) 100 UNIT/ML Inject 20 Units into the skin daily before breakfast.  . metFORMIN (GLUCOPHAGE) 500 MG tablet TAKE 2 TABLETS BY MOUTH TWICE DAILY WITHA MEAL  . metoprolol tartrate (LOPRESSOR) 50 MG tablet TAKE 1 TABLET BY MOUTH TWICE DAILY  . tamsulosin (FLOMAX) 0.4 MG CAPS capsule TAKE 1 CAPSULE BY MOUTH DAILY  . tizanidine (ZANAFLEX) 2 MG capsule Take 1-2 capsules (2-4 mg total) by mouth 3 (three) times daily.  . [DISCONTINUED] Alirocumab (PRALUENT) 150 MG/ML SOAJ Inject 150 mg into the skin every 14 (fourteen) days.  . [DISCONTINUED] furosemide (LASIX) 40 MG tablet TAKE 1 TABLET BY MOUTH DAILY  . [DISCONTINUED] lisinopril-hydrochlorothiazide (ZESTORETIC) 10-12.5 MG tablet  Take 1 tablet by mouth daily.     Allergies:   Amlodipine, Amitriptyline, Carvedilol, Nortriptyline hcl, and Simvastatin   Social History   Tobacco Use  . Smoking status: Former Smoker    Packs/day: 1.00    Years: 48.00    Pack years: 48.00    Types: Cigarettes    Start date: 08/12/1970    Quit date: 08/12/2018    Years since quitting: 2.3  . Smokeless tobacco: Never Used  . Tobacco comment: Quit post CABG in 2019 - previous 1ppd  Vaping Use  . Vaping Use: Never used  Substance Use Topics  . Alcohol use: No    Alcohol/week: 0.0 standard drinks  . Drug use: No     Family Hx: The patient's family history includes Diabetes in his mother; Heart disease in his father and mother; Heart failure in his mother; Sudden death in his brother.  ROS:   Please see the history of present illness.    All other systems reviewed and are negative.   Prior CV  studies:   The following studies were reviewed today:  Echo 10/08/2018- Study Conclusions   - Left ventricle: LVEF is approximately 50% with hypokinesis of the  lateral (base/mid) and basal inferior walls The cavity size was  normal.  - Mitral valve: Calcified annulus. Mildly thickened leaflets .  There was mild regurgitation.  - Left atrium: The atrium was moderately dilated.  - Right atrium: The atrium was mildly dilated.   Labs/Other Tests and Data Reviewed:    EKG:  An ECG dated 11/11/2019 was personally reviewed today and demonstrated:  NSR, 60, inferior Qs  Recent Labs: 07/28/2020: Hemoglobin 11.7; Platelets 272 12/10/2020: BUN 29; Creatinine, Ser 1.50; Potassium 4.8; Sodium 140   Recent Lipid Panel Lab Results  Component Value Date/Time   CHOL 184 09/02/2019 08:38 AM   TRIG 86 09/02/2019 08:38 AM   HDL 54 09/02/2019 08:38 AM   CHOLHDL 3.4 09/02/2019 08:38 AM   CHOLHDL 3.0 08/19/2018 06:03 AM   LDLCALC 114 (H) 09/02/2019 08:38 AM   LDLDIRECT 105 09/19/2016 09:22 AM    Wt Readings from Last 3 Encounters:   12/22/20 155 lb (70.3 kg)  12/10/20 156 lb 4 oz (70.9 kg)  06/16/20 149 lb 12.8 oz (67.9 kg)     Risk Assessment/Calculations:      Objective:    Vital Signs:  BP (!) 153/63   Pulse 61   Ht 5\' 11"  (1.803 m)   Wt 155 lb (70.3 kg)   BMI 21.62 kg/m    VITAL SIGNS:  reviewed  ASSESSMENT & PLAN:    CABG - CABG 2019- no angina  HLD- PCSK9 agent stopped- ? I will ask our pharmacist to look into this.   IDDM- Per PCP        COVID-19 Education: The signs and symptoms of COVID-19 were discussed with the patient and how to seek care for testing (follow up with PCP or arrange E-visit).  The importance of social distancing was discussed today.  Time:   Today, I have spent 10 minutes with the patient with telehealth technology discussing the above problems.     Medication Adjustments/Labs and Tests Ordered: Current medicines are reviewed at length with the patient today.  Concerns regarding medicines are outlined above.   Tests Ordered: No orders of the defined types were placed in this encounter.   Medication Changes: No orders of the defined types were placed in this encounter.   Follow Up:  In Person 6-8 months with Dr 2020  Signed, Jens Som, PA-C  12/22/2020 3:35 PM    [facility]

## 2020-12-22 NOTE — Assessment & Plan Note (Signed)
He was initially placed on Repatha, then this was changed to Pralulent.  For some reason unknown to the patient he no longer receives this medication in the mail.

## 2020-12-22 NOTE — Telephone Encounter (Signed)
I called and instructed pt on how to reapply for [employer] and resent rx, pt voiced understanding

## 2020-12-22 NOTE — Telephone Encounter (Signed)
Pt was switched from Repatha to Praluent by insurance in 2021, but apparently never picked up.  Will resend to pharmacy.

## 2020-12-22 NOTE — Assessment & Plan Note (Signed)
2019- LIMA to LAD, RIMA to RCA, SVG to OM3, EVH via right thigh Doing well- no c/o angina

## 2020-12-22 NOTE — Telephone Encounter (Signed)
-----   Message from Rosalee Kaufman, RPH-CPP sent at 12/22/2020  4:54 PM EST ----- Regarding: [employer]Well Colin Keller  You got this guy set up for Repatha and [employer] early last year.  At some point in the year his insurance switched him to Praluent, and when it didn't show up on his doorstep free of charge, he didn't bother to call and ask about it (!!).  Looks like his Praluent PA is good, but could you renew his [employer] grant and call him with the information?  I told him he will have to go to the pharmacy and pick up the prescription from now on.  I sent it to [facility].  Thanks,  K

## 2020-12-22 NOTE — Assessment & Plan Note (Signed)
Carotid dopplers 08/2019 40-59% bilateral stenosis and right subclavian stenosis

## 2020-12-23 DIAGNOSIS — I2510 Atherosclerotic heart disease of native coronary artery without angina pectoris: Secondary | ICD-10-CM | POA: Diagnosis not present

## 2020-12-23 DIAGNOSIS — N1832 Chronic kidney disease, stage 3b: Secondary | ICD-10-CM | POA: Diagnosis not present

## 2020-12-23 DIAGNOSIS — I129 Hypertensive chronic kidney disease with stage 1 through stage 4 chronic kidney disease, or unspecified chronic kidney disease: Secondary | ICD-10-CM | POA: Diagnosis not present

## 2020-12-23 DIAGNOSIS — N2581 Secondary hyperparathyroidism of renal origin: Secondary | ICD-10-CM | POA: Diagnosis not present

## 2020-12-23 DIAGNOSIS — D631 Anemia in chronic kidney disease: Secondary | ICD-10-CM | POA: Diagnosis not present

## 2020-12-23 DIAGNOSIS — I509 Heart failure, unspecified: Secondary | ICD-10-CM | POA: Diagnosis not present

## 2020-12-23 DIAGNOSIS — E1122 Type 2 diabetes mellitus with diabetic chronic kidney disease: Secondary | ICD-10-CM | POA: Diagnosis not present

## 2020-12-23 NOTE — Telephone Encounter (Signed)
Called and lmomed the pt that the pt's pharmacy doesn't carry praluent and that we need to send it somewhere else will await call back

## 2021-01-08 ENCOUNTER — Telehealth: Payer: Self-pay

## 2021-01-08 ENCOUNTER — Other Ambulatory Visit: Payer: Self-pay | Admitting: Family Medicine

## 2021-01-08 DIAGNOSIS — G8929 Other chronic pain: Secondary | ICD-10-CM

## 2021-01-08 DIAGNOSIS — M545 Low back pain, unspecified: Secondary | ICD-10-CM

## 2021-01-08 NOTE — Telephone Encounter (Signed)
Patients wife calls nurse line reporting the pharmacy will not release his pain medication prescription. I called the pharmacy and they need PCP to verbally call them to give ok to release today. He picked up last prescription on 01/3, therefore is flagging in their system he is too early. Will forward to PCP.

## 2021-01-08 NOTE — Telephone Encounter (Signed)
Gave authorization for Norco fill today to pharmacy.  Pharmacy said they would fill this Norco prescriptions, but said Colin Keller will have to wait 30 days before his next refill in February.   Recommend that in future,  Colin Keller not delay picking up his prescription for hydrocodone as this will delay his refill the next month.

## 2021-01-14 ENCOUNTER — Encounter: Payer: Self-pay | Admitting: Family Medicine

## 2021-01-14 ENCOUNTER — Ambulatory Visit: Payer: Medicare HMO | Admitting: Pharmacist

## 2021-01-14 ENCOUNTER — Ambulatory Visit (INDEPENDENT_AMBULATORY_CARE_PROVIDER_SITE_OTHER): Payer: Medicare HMO | Admitting: Family Medicine

## 2021-01-14 ENCOUNTER — Other Ambulatory Visit: Payer: Self-pay

## 2021-01-14 ENCOUNTER — Ambulatory Visit: Payer: Medicare HMO | Admitting: Family Medicine

## 2021-01-14 VITALS — BP 160/70 | HR 100 | Ht 71.0 in | Wt 156.0 lb

## 2021-01-14 DIAGNOSIS — E1165 Type 2 diabetes mellitus with hyperglycemia: Secondary | ICD-10-CM

## 2021-01-14 DIAGNOSIS — I5042 Chronic combined systolic (congestive) and diastolic (congestive) heart failure: Secondary | ICD-10-CM

## 2021-01-14 DIAGNOSIS — E1159 Type 2 diabetes mellitus with other circulatory complications: Secondary | ICD-10-CM | POA: Diagnosis not present

## 2021-01-14 DIAGNOSIS — I152 Hypertension secondary to endocrine disorders: Secondary | ICD-10-CM | POA: Diagnosis not present

## 2021-01-14 DIAGNOSIS — E785 Hyperlipidemia, unspecified: Secondary | ICD-10-CM | POA: Diagnosis not present

## 2021-01-14 DIAGNOSIS — I771 Stricture of artery: Secondary | ICD-10-CM | POA: Diagnosis not present

## 2021-01-14 DIAGNOSIS — E1169 Type 2 diabetes mellitus with other specified complication: Secondary | ICD-10-CM

## 2021-01-14 DIAGNOSIS — E11319 Type 2 diabetes mellitus with unspecified diabetic retinopathy without macular edema: Secondary | ICD-10-CM

## 2021-01-14 MED ORDER — NOVOLOG FLEXPEN 100 UNIT/ML ~~LOC~~ SOPN: 4.0000 [IU] | PEN_INJECTOR | Freq: Two times a day (BID) | SUBCUTANEOUS | Status: DC

## 2021-01-14 MED ORDER — BASAGLAR KWIKPEN 100 UNIT/ML ~~LOC~~ SOPN: 18.0000 [IU] | PEN_INJECTOR | Freq: Every day | SUBCUTANEOUS | Status: DC

## 2021-01-14 MED ORDER — HYDRALAZINE HCL 25 MG PO TABS
25.0000 mg | ORAL_TABLET | Freq: Three times a day (TID) | ORAL | 3 refills | Status: DC
Start: 2021-01-14 — End: 2021-08-03

## 2021-01-14 NOTE — Progress Notes (Addendum)
   S:      Patient presents today for diabetes management.   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   Patient reports occasional lows in the middle of the night/early morning.  Patient reports adherence with medications.  Current diabetes medications include: metformin (Glucophage) 500mg  2 tablets BID, insulin glargine (Basaglar) 20 units once daily before breakfast, insulin aspart (Novolog) 4-6 units TID with meals.  Patient reports diet is good. Always eats breakfast and dinner, though sometimes lunch resembles more of a snack.  O:   CGM report from last 14 days  Glucose Meter Index (GMI) 9.1   Time in Range (TIR) %  Very High 45  High 22  Target 31  Low 2  Very Low 0   Labs:   Vitals:   01/14/21 0906  BP: (!) 160/70  Pulse: 100  SpO2: 97%    Assessment and Plan: - DM suboptimally controlled likely due to lack of substantial mid-day meal and delaying insulin aspart (Novolog) use after dinner. Delaying use of Novolog is also causing some lows in the middle of nights/early mornings. - Decrease insulin glargine (Basaglar) dose from 20 units to 18 units before breakfast.  - Start taking insulin aspart (Novolog) 4-6 units twice daily with meals instead of three times daily. - Patient was counseled on taking evening Novolog dose right before eating dinner.  - Next A1C anticipated in ~2 months.    Written patient instructions provided  This appointment required 30 minutes of patient care (this includes precharting, chart review, review of results, face-to-face care, etc.).  Thank you for involving pharmacy to assist in providing this patient's care.  Patient seen by 03/14/21, PharmD Candidate, Michiel Cowboy, PharmD - PGY-1 Resident, and Calton Dach, PharmD, BCPS, PGY2 Pharmacy Resident.

## 2021-01-14 NOTE — Addendum Note (Signed)
Addended by: Kathrin Ruddy on: 01/14/2021 01:42 PM   Modules accepted: Orders

## 2021-01-14 NOTE — Progress Notes (Addendum)
Colin Keller is alone Sources of clinical information for visit is/are patient and past medical records. Nursing assessment for this office visit was reviewed with the patient for accuracy and revision.     Previous Report(s) Reviewed: lab reports and office notes  Patient's care discussed with pharmacy team. Depression screen [facility] 2/9 01/14/2021  Decreased Interest 0  Down, Depressed, Hopeless 0  PHQ - 2 Score 0  Altered sleeping 0  Tired, decreased energy 0  Change in appetite 0  Feeling bad or failure about yourself  0  Trouble concentrating 0  Moving slowly or fidgety/restless 0  Suicidal thoughts 0  PHQ-9 Score 0  Difficult doing work/chores -  Some recent data might be hidden    Fall Risk  01/14/2021 05/07/2020 03/26/2020 06/06/2019 06/06/2018  Falls in the past year? 0 0 0 0 No  Number falls in past yr: 0 - - 0 -  Injury with Fall? 0 - - - -  Follow up - - - Falls evaluation completed -    PHQ9 SCORE ONLY 01/14/2021 12/10/2020 05/07/2020  PHQ-9 Total Score 0 5 0    Adult vaccines due  Topic Date Due  . TETANUS/TDAP  10/02/2024    Health Maintenance Due  Topic Date Due  . COLONOSCOPY (Pts 45-62yrs Insurance coverage will need to be confirmed)  Never done  . OPHTHALMOLOGY EXAM  06/21/2018  . COLON CANCER SCREENING ANNUAL FOBT  01/22/2019      History/P.E. limitations: none  Adult vaccines due  Topic Date Due  . TETANUS/TDAP  10/02/2024    Diabetes Health Maintenance Due  Topic Date Due  . OPHTHALMOLOGY EXAM  06/21/2018  . HEMOGLOBIN A1C  06/10/2021  . FOOT EXAM  01/14/2022    Health Maintenance Due  Topic Date Due  . COLONOSCOPY (Pts 45-52yrs Insurance coverage will need to be confirmed)  Never done  . OPHTHALMOLOGY EXAM  06/21/2018  . COLON CANCER SCREENING ANNUAL FOBT  01/22/2019     Chief Complaint  Patient presents with  . Medication Management    Praluent supply  . medication followup    Muscle relaxer    Diabetic Foot Exam - Simple   Simple  Foot Form Diabetic Foot exam was performed with the following findings: Yes 01/14/2021 10:00 AM  Visual Inspection No deformities, no ulcerations, no other skin breakdown bilaterally: Yes Sensation Testing Intact to touch and monofilament testing bilaterally: Yes Pulse Check Posterior Tibialis and Dorsalis pulse intact bilaterally: Yes Comments    Visit Problem List with A/P  DM (diabetes mellitus), type 2, uncontrolled (HCC) - DM suboptimally controlled likely due to lack of substantial mid-day meal and delaying insulin aspart (Novolog) use after dinner. Delaying use of Novolog is also causing some lows in the middle of nights/early mornings. - Decrease insulin glargine (Basaglar) dose from 20 units to 18 units before breakfast.  - Start taking insulin aspart (Novolog) 4-6 units twice daily with meals instead of three times daily. - Patient was counseled on taking evening Novolog dose right before eating dinner.  Subclavian artery stenosis, right (HCC) Established problem. Stable. No signs of complications, medication side effects, or red flags. Continue current medications and other regiments. Discussed importance of having BP taken in left arm.    Hypertension associated with diabetes (HCC) Established problem Inadequate control. No signs of complications, medication side effects, or red flags. Colin Keller is hesitant to change his antihypertension regiment.  He is willing to Increase hydralazine 25 mg to TID from BID  Hyperlipidemia associated with type 2 diabetes mellitus [facility]) Patient has not heard from [facility] Discussed with Pharmacy Team - they plan to look into where he is in the application process.

## 2021-01-14 NOTE — Patient Instructions (Addendum)
Mr Shugars,  Your blood pressure remains slightly high on the top number, running over 140 on almost all your recent office visits.  I would like to see your top blood pressure number less than 130 most of the time.   Please increase your blood pressure medicine hydralazine to one tablet three times a day. Keep taking your metoprolol to 25 mg twice a day.   For your blood sugars, decrease your basaglar to 18 units Daily and the Novolog to 4-6 units twice a day.  Recommend you not take it with lunch.  Keep taking your metformin and [name] as you are.   Do think about getting your cataracts fixed.  You will be very likely surprised how well it can improve your vision.   You have a blockage in the artery to your right arm.  It is called subclavian stenosis.  You should have your blood pressures measured in your left arm to make sure you get a true blood pressure measurement.

## 2021-01-14 NOTE — Assessment & Plan Note (Signed)
-   DM suboptimally controlled likely due to lack of substantial mid-day meal and delaying insulin aspart (Novolog) use after dinner. Delaying use of Novolog is also causing some lows in the middle of nights/early mornings. - Decrease insulin glargine (Basaglar) dose from 20 units to 18 units before breakfast.  - Start taking insulin aspart (Novolog) 4-6 units twice daily with meals instead of three times daily. - Patient was counseled on taking evening Novolog dose right before eating dinner.

## 2021-01-15 NOTE — Assessment & Plan Note (Addendum)
Established problem Inadequate control. No signs of complications, medication side effects, or red flags. Colin Keller is hesitant to change his antihypertension regiment.  He is willing to Increase hydralazine 25 mg to TID from BID

## 2021-01-15 NOTE — Assessment & Plan Note (Signed)
Established problem. Stable. No signs of complications, medication side effects, or red flags. Continue current medications and other regiments. Discussed importance of having BP taken in left arm.

## 2021-01-15 NOTE — Assessment & Plan Note (Signed)
Patient has not heard from [facility] Discussed with Pharmacy Team - they plan to look into where he is in the application process.

## 2021-01-18 ENCOUNTER — Telehealth: Payer: Self-pay

## 2021-01-18 ENCOUNTER — Other Ambulatory Visit: Payer: Self-pay

## 2021-01-18 MED ORDER — PRALUENT 150 MG/ML ~~LOC~~ SOAJ: 150.0000 mg | SUBCUTANEOUS | 3 refills | Status: DC

## 2021-01-18 NOTE — Telephone Encounter (Signed)
Reached out to patient regarding Praluent medication and getting him patient assistance. He is currently working on getting assistance with someone Rolly Salter) at Dr. Waunita Schooner (cardiologist) office.

## 2021-01-18 NOTE — Telephone Encounter (Signed)
Called and spoke w/pt regarding [employer] approval to cover praluent pcsk9 cost he voiced understanding

## 2021-01-18 NOTE — Telephone Encounter (Signed)
-----   Message from Leighton Roach McDiarmid, MD sent at 01/15/2021 11:50 AM EST ----- Regarding: Question about Praluent for patient Ms Neale Burly,  I am the PCP for Mr Eshbach. I saw him yesterday for med follow up.  Do you know if he has qualified for coverage of his PCK9 inhibitor? He was not certain.  Thank you for the up date,  Legrand Como McDiarmid, MD

## 2021-02-01 ENCOUNTER — Telehealth: Payer: Self-pay | Admitting: Cardiology

## 2021-02-01 NOTE — Telephone Encounter (Signed)
Patient states he is returning [place] call.  

## 2021-02-02 ENCOUNTER — Other Ambulatory Visit: Payer: Self-pay

## 2021-02-02 MED ORDER — PRALUENT 75 MG/ML ~~LOC~~ SOAJ: 75.0000 mg | SUBCUTANEOUS | 0 refills | Status: DC

## 2021-02-02 NOTE — Telephone Encounter (Signed)
lmom for pt to return call. °

## 2021-02-04 DIAGNOSIS — E1165 Type 2 diabetes mellitus with hyperglycemia: Secondary | ICD-10-CM | POA: Diagnosis not present

## 2021-02-06 ENCOUNTER — Other Ambulatory Visit: Payer: Self-pay | Admitting: Family Medicine

## 2021-02-06 DIAGNOSIS — M545 Low back pain, unspecified: Secondary | ICD-10-CM

## 2021-02-06 DIAGNOSIS — G8929 Other chronic pain: Secondary | ICD-10-CM

## 2021-02-22 ENCOUNTER — Other Ambulatory Visit: Payer: Self-pay | Admitting: Cardiology

## 2021-02-22 ENCOUNTER — Other Ambulatory Visit: Payer: Self-pay

## 2021-02-22 MED ORDER — PRALUENT 150 MG/ML ~~LOC~~ SOAJ: 150.0000 mg | SUBCUTANEOUS | 11 refills | Status: DC

## 2021-03-05 ENCOUNTER — Other Ambulatory Visit: Payer: Self-pay | Admitting: Family Medicine

## 2021-03-05 DIAGNOSIS — G8929 Other chronic pain: Secondary | ICD-10-CM

## 2021-03-05 DIAGNOSIS — M545 Low back pain, unspecified: Secondary | ICD-10-CM

## 2021-03-06 DIAGNOSIS — E1165 Type 2 diabetes mellitus with hyperglycemia: Secondary | ICD-10-CM | POA: Diagnosis not present

## 2021-03-16 ENCOUNTER — Encounter: Payer: Self-pay | Admitting: Pharmacist

## 2021-03-16 DIAGNOSIS — Z9189 Other specified personal risk factors, not elsewhere classified: Secondary | ICD-10-CM

## 2021-03-16 DIAGNOSIS — G720 Drug-induced myopathy: Secondary | ICD-10-CM

## 2021-03-16 NOTE — Progress Notes (Signed)
[facility])                                            [facility] Quality Pharmacy Team                                        Statin Quality Measure Assessment    03/16/2021  MOODY ROBBEN [birth year] [id number]  Per review of chart and payor information, this patient has been flagged for non-adherence to the following CMS Quality Measure:   [x]  Statin Use in Persons with Diabetes  [x]  Statin Use in Persons with Cardiovascular Disease   Currently prescribed statin:  []  Yes [x]  No     Comments: Intolerant due to myalgias, currently in process for patient assistance for PCSK9i  History of statin use:            [x]  Yes []  No   Comments: has failed statins due to myalgias   Please consider the following recommendations:   Code for past statin intolerance (required annually)  Consider using code 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    Thank you for your time, , PharmD, [facility] Clinical Pharmacist [facility] [phone]

## 2021-03-18 ENCOUNTER — Ambulatory Visit (INDEPENDENT_AMBULATORY_CARE_PROVIDER_SITE_OTHER): Payer: Medicare HMO | Admitting: Family Medicine

## 2021-03-18 ENCOUNTER — Other Ambulatory Visit: Payer: Self-pay

## 2021-03-18 VITALS — BP 150/54 | HR 64 | Ht 71.0 in | Wt 152.4 lb

## 2021-03-18 DIAGNOSIS — E1165 Type 2 diabetes mellitus with hyperglycemia: Secondary | ICD-10-CM | POA: Diagnosis not present

## 2021-03-18 DIAGNOSIS — G8929 Other chronic pain: Secondary | ICD-10-CM

## 2021-03-18 DIAGNOSIS — M25512 Pain in left shoulder: Secondary | ICD-10-CM | POA: Diagnosis not present

## 2021-03-18 DIAGNOSIS — N1832 Chronic kidney disease, stage 3b: Secondary | ICD-10-CM | POA: Diagnosis not present

## 2021-03-18 DIAGNOSIS — E1159 Type 2 diabetes mellitus with other circulatory complications: Secondary | ICD-10-CM

## 2021-03-18 DIAGNOSIS — I152 Hypertension secondary to endocrine disorders: Secondary | ICD-10-CM | POA: Diagnosis not present

## 2021-03-18 HISTORY — DX: Other chronic pain: G89.29

## 2021-03-18 LAB — POCT GLYCOSYLATED HEMOGLOBIN (HGB A1C): HbA1c, POC (controlled diabetic range): 9.3 % — AB (ref 0.0–7.0)

## 2021-03-18 MED ORDER — METHYLPREDNISOLONE ACETATE 40 MG/ML IJ SUSP
40.0000 mg | Freq: Once | INTRAMUSCULAR | Status: AC
  Administered 2021-03-18: 40 mg via INTRAMUSCULAR

## 2021-03-18 NOTE — Progress Notes (Signed)
    SUBJECTIVE:   CHIEF COMPLAINT / HPI:   Left Shoulder pain  Patient reports that he has been having considerable amount of pain in his left shoulder especially at night when he lays down.  The pain is with movement especially flexion and abduction.  Reports that he has had issues with his rotator cuff in the past in both shoulders.  He is interested in an injection in his left shoulder today.  Reports that he has had an injection in his right shoulder in the past and it helped.  Diabetes  Patient reports his diabetes has been doing okay with numbers ranging his high as 180 in the morning.  He does report he had an episode where his blood sugar got down to 52 but that was 1 occurrence.  Is currently compliant with all of his medications.  Depression Patient reports he recently lost his son to a drug overdose.  It was less than a month ago and he feels like he is coping well with it and he is trying to help his wife and daughter.  He is intermittently tearful throughout the interaction.  He does not want any treatment for the depression at this time but if he desires in the future he will reach out.  PERTINENT  PMH / PSH: Diabetes  OBJECTIVE:   BP (!) 150/54   Pulse 64   Ht 5\' 11"  (1.803 m)   Wt 152 lb 6 oz (69.1 kg)   SpO2 98%   BMI 21.25 kg/m General: Sad, intermittently tearful but otherwise well-appearing Cardiac: Regular rate and rhythm, no murmurs appreciated Respiratory: Normal for breathing Abdomen: Soft, nontender, positive bowel sounds MSK: Left shoulder with pain at the posterior aspect of the joint.  Discomfort with AB and adduction.  5 out of 5 strength.  INJECTION: Patient was given informed consent, signed copy in the chart. Appropriate time out was taken. Area prepped in usual fashion with overlying skin cleaned using isopropyl alcohol. Local anesthesia achieved using Ethyl Chloride spray (Cooling spray). 1 cc of methylprednisolone 40 mg/ml plus  4 cc of 1% lidocaine  without epinephrine was injected into the left glenohumeral joint using a(n) posterior approach. The patient tolerated the procedure well. There were no complications. Post procedure instructions were given.   ASSESSMENT/PLAN:   Chronic left shoulder pain Patient with longstanding history of left shoulder pain.  Reports that it has been unbearable recently especially when he is sleeping.  He reports that he had issues with his right shoulder in the past and received an injection but has not had one in his left shoulder.  Would like an injection today.  Injection performed (see above). -Discussed strengthening exercises with patient -Strict return precautions given  DM (diabetes mellitus), type 2, uncontrolled (HCC) Diabetes not currently well controlled.  Hemoglobin A1c at last check was 10.7.  Currently on Metformin, Basaglar, NovoLog.  Recently started on the NovoLog.  Hemoglobin A1c today was 9.3.  We will continue to monitor his blood sugars and we will make adjustments as needed.  Patient may need to follow-up with pharmacist regarding further medication changes.     , MD [facility]

## 2021-03-18 NOTE — Assessment & Plan Note (Signed)
Diabetes not currently well controlled.  Hemoglobin A1c at last check was 10.7.  Currently on Metformin, Basaglar, NovoLog.  Recently started on the NovoLog.  Hemoglobin A1c today was 9.3.  We will continue to monitor his blood sugars and we will make adjustments as needed.  Patient may need to follow-up with pharmacist regarding further medication changes.

## 2021-03-18 NOTE — Assessment & Plan Note (Signed)
Patient with longstanding history of left shoulder pain.  Reports that it has been unbearable recently especially when he is sleeping.  He reports that he had issues with his right shoulder in the past and received an injection but has not had one in his left shoulder.  Would like an injection today.  Injection performed (see above). -Discussed strengthening exercises with patient -Strict return precautions given

## 2021-03-18 NOTE — Patient Instructions (Signed)
It was great seeing you today.  I am so sorry for your loss.  There is anything we can do please let us know.  Regarding your shoulder pain we gave you an injection in your shoulder.  It should be numb for the rest of the day but may hurt a little bit more tomorrow but over the next few days should improve.  We are checking some lab work today because you have not had a recent diabetes checkup so we are going to check your hemoglobin A1c as well as your kidney function.  I will call you with those results or send you message on MyChart.  If you have any questions or concerns please feel free to call the clinic.  I hope you have a wonderful afternoon!   Joint Steroid Injection A joint steroid injection is a procedure to relieve swelling and pain in a joint. Steroids are medicines that reduce inflammation. In this procedure, your health care provider uses a syringe and a needle to inject a steroid medicine into a painful and inflamed joint. A pain-relieving medicine (anesthetic) may be injected along with the steroid. In some cases, your health care provider may use an imaging technique such as ultrasound or fluoroscopy to guide the injection. Joints that are often treated with steroid injections include the knee, shoulder, hip, and spine. These injections may also be used in the elbow, ankle, and joints of the hands or feet. You may have joint steroid injections as part of your treatment for inflammation caused by:  Gout.  Rheumatoid arthritis.  Advanced wear-and-tear arthritis (osteoarthritis).  Tendinitis.  Bursitis. Joint steroid injections may be repeated, but having them too often can damage a joint or the skin over the joint. You should not have joint steroid injections less than 6 weeks apart or more than four times a year. Tell a health care provider about:  Any allergies you have.  All medicines you are taking, including vitamins, herbs, eye drops, creams, and over-the-counter  medicines.  Any problems you or family members have had with anesthetic medicines.  Any blood disorders you have.  Any surgeries you have had.  Any medical conditions you have.  Whether you are pregnant or may be pregnant. What are the risks? Generally, this is a safe treatment. However, problems may occur, including:  Infection.  Bleeding.  Allergic reactions to medicines.  Damage to the joint or tissues around the joint.  Thinning of skin or loss of skin color over the joint.  Temporary flushing of the face or chest.  Temporary increase in pain.  Temporary increase in blood sugar.  Failure to relieve inflammation or pain. What happens before the treatment? Medicines Ask your health care provider about:  Changing or stopping your regular medicines. This is especially important if you are taking diabetes medicines or blood thinners.  Taking medicines such as aspirin and ibuprofen. These medicines can thin your blood. Do not take these medicines unless your health care provider tells you to take them.  Taking over-the-counter medicines, vitamins, herbs, and supplements. General instructions  You may have imaging tests of your joint.  Ask your health care provider if you can drive yourself home after the procedure. What happens during the treatment?  Your health care provider will position you for the injection and locate the injection site over your joint.  The skin over the joint will be cleaned with a germ-killing soap.  Your health care provider may: ? Spray a numbing solution (topical anesthetic)  over the injection site. ? Inject a local anesthetic under the skin above your joint.  The needle will be placed through your skin into your joint. Your health care provider may use imaging to guide the needle to the right spot for the injection. If imaging is used, a special contrast dye may be injected to confirm that the needle is in the correct location.  The  steroid medicine will be injected into your joint.  Anesthetic may be injected along with the steroid. This may be a medicine that relieves pain for a short time (short-acting anesthetic) or for a longer time (long-acting anesthetic).  The needle will be removed, and an adhesive bandage (dressing) will be placed over the injection site. The procedure may vary among health care providers and hospitals.   What can I expect after the treatment?  You will be able to go home after the treatment.  It is normal to feel slight flushing for a few days after the injection.  After the treatment, it is common to have an increase in joint pain after the anesthetic has worn off. This may happen about an hour after a short-acting anesthetic or about 8 hours after a longer-acting anesthetic.  You should begin to feel relief from joint pain and swelling after 24 to 48 hours. Contact your health care provider if you do not begin to feel relief after 2 days. Follow these instructions at home: Injection site care  Leave the adhesive dressing over your injection site in place until your health care provider says you can remove it.  Check your injection site every day for signs of infection. Check for: ? More redness, swelling, or pain. ? Fluid or blood. ? Warmth. ? Pus or a bad smell. Activity  Return to your normal activities as told by your health care provider. Ask your health care provider what activities are safe for you. You may be asked to limit activities that put stress on the joint for a few days.  Do joint exercises as told by your health care provider.  Do not take baths, swim, or use a hot tub until your health care provider approves. Ask your health care provider if you may take showers. You may only be allowed to take sponge baths. Managing pain, stiffness, and swelling  If directed, put ice on the joint. To do this: ? Put ice in a plastic bag. ? Place a towel between your skin and the  bag. ? Leave the ice on for 20 minutes, 2-3 times a day. ? Remove the ice if your skin turns bright red. This is very important. If you cannot feel pain, heat, or cold, you have a greater risk of damage to the area.  Raise (elevate) your joint above the level of your heart when you are sitting or lying down.   General instructions  Take over-the-counter and prescription medicines only as told by your health care provider.  Do not use any products that contain nicotine or tobacco, such as cigarettes, e-cigarettes, and chewing tobacco. These can delay joint healing. If you need help quitting, ask your health care provider.  If you have diabetes, be aware that your blood sugar may be slightly elevated for several days after the injection.  Keep all follow-up visits. This is important. Contact a health care provider if you have:  Chills or a fever.  Any signs of infection at your injection site.  Increased pain or swelling or no relief after 2 days. Summary  A joint steroid injection is a treatment to relieve pain and swelling in a joint.  Steroids are medicines that reduce inflammation. Your health care provider may add an anesthetic along with the steroid.  You may have joint steroid injections as part of your arthritis treatment.  Joint steroid injections may be repeated, but having them too often can damage a joint or the skin over the joint.  Contact your health care provider if you have a fever, chills, or signs of infection, or if you get no relief from joint pain or swelling. This information is not intended to replace advice given to you by your health care provider. Make sure you discuss any questions you have with your health care provider. Document Revised: 05/08/2020 Document Reviewed: 05/08/2020 Elsevier Patient Education  2021 [employer].

## 2021-03-19 LAB — BASIC METABOLIC PANEL
BUN/Creatinine Ratio: 24 (ref 10–24)
BUN: 35 mg/dL — ABNORMAL HIGH (ref 8–27)
CO2: 21 mmol/L (ref 20–29)
Calcium: 9.9 mg/dL (ref 8.6–10.2)
Chloride: 100 mmol/L (ref 96–106)
Creatinine, Ser: 1.45 mg/dL — ABNORMAL HIGH (ref 0.76–1.27)
Glucose: 368 mg/dL — ABNORMAL HIGH (ref 65–99)
Potassium: 4.4 mmol/L (ref 3.5–5.2)
Sodium: 138 mmol/L (ref 134–144)
eGFR: 53 mL/min/{1.73_m2} — ABNORMAL LOW (ref >59)

## 2021-03-19 LAB — PHOSPHORUS: Phosphorus: 3.4 mg/dL (ref 2.8–4.1)

## 2021-03-19 LAB — MAGNESIUM: Magnesium: 1.9 mg/dL (ref 1.6–2.3)

## 2021-04-02 ENCOUNTER — Telehealth: Payer: Self-pay

## 2021-04-02 NOTE — Telephone Encounter (Signed)
I put a RX fax in your inbox. I didn't see medications on current list. If you want patient to start these medications please send to pharmacy. Aquilla Solian, CMA

## 2021-04-03 ENCOUNTER — Other Ambulatory Visit: Payer: Self-pay | Admitting: Family Medicine

## 2021-04-03 DIAGNOSIS — M545 Low back pain, unspecified: Secondary | ICD-10-CM

## 2021-04-03 DIAGNOSIS — G8929 Other chronic pain: Secondary | ICD-10-CM

## 2021-04-06 DIAGNOSIS — E1165 Type 2 diabetes mellitus with hyperglycemia: Secondary | ICD-10-CM | POA: Diagnosis not present

## 2021-04-15 ENCOUNTER — Ambulatory Visit: Payer: Medicare HMO

## 2021-04-21 ENCOUNTER — Ambulatory Visit (INDEPENDENT_AMBULATORY_CARE_PROVIDER_SITE_OTHER): Payer: Medicare HMO

## 2021-04-21 VITALS — Ht 72.0 in | Wt 155.0 lb

## 2021-04-21 DIAGNOSIS — Z0000 Encounter for general adult medical examination without abnormal findings: Secondary | ICD-10-CM

## 2021-04-21 NOTE — Patient Instructions (Addendum)
You spoke to Colin Keller, CMA over the phone for your annual wellness visit.  We discussed goals: Goals    .   Acknowledge receipt of Medical illustrator    .  HEMOGLOBIN A1C < 8    .  I would like to manage my diabetes (pt-stated)      Current Barriers:  . Diabetes: T2DM; complicated by chronic medical conditions including HTN, HLD, CAD, HFpEF most recent A1c 11% on 06/06/19 . Current antihyperglycemic regimen: metformin 1 g BID, Tresiba (Insulin Degludec) 18 units daily. Novolog (Insulin Aspart) at a sliding scale of 4-6 units with breakfast and 4-6 units with dinner.  . Denies hypoglycemic symptoms (has experienced in the past, counseled on hypoglyemic) . Reports hyperglycemic symptoms, including: polyuria, polydipsia,  nocturia . Current meal patterns: o Breakfast: grits, bacon, eggs o Lunch:crackers o Supper: incorporates protein, veggie, low carb o Snacks: rare, does not like sweets o Drinks: avoids sugary drinks, water coffee . 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 Current blood glucose readings: FBGs 180-200; pt reports BG is consistently high; one reported FBG of 74 because he skipped dinner o Attributes high Bgs to lack of physical activity . Cardiovascular risk reduction: o Current hypertensive regimen: metoprolol BID o Current hyperlipidemia regimen: Repatha-starting this month (due for LDL, seeing heart care PharmD for lipids) o Current antiplatelet regimen: ASA  Pharmacist Clinical Goal(s):  Marland Kitchen Over the next 90 days, patient will work with PharmD and primary care provider to address related to diabetes management  Interventions: . Comprehensive medication review performed, medication list updated in electronic medical record . Continue Tresiba 18 units daily. Continue other medications as above o Applied for patient assistance to obtain Basaglar, Humalog via [employer] . Attempting to get SGLT2 added to regimen once barriers removed (financial).   Unable to apply for Jardiance of [name] based on patient's financial situation.  Will attempt to get Staglatro using special copay card (max savings $585 per RX)  Patient Self Care Activities:  . Patient will check blood glucose 3x daily, document, and provide at future appointments . Patient will focus on medication adherence  . Patient will take medications as prescribed . Patient will contact provider with any episodes of hypoglycemia . Patient will report any questions or concerns to provider   Please see past updates related to this goal by clicking on the "Past Updates" button in the selected goal      .  Quit Smoking    .  Quit smoking / using tobacco      We also discussed recommended health maintenance. Please call our office and schedule a visit for July. As discussed, you are due for the following.   Health Maintenance  Topic Date Due  . COLONOSCOPY (Pts 45-66yrs Insurance coverage will need to be confirmed)  Never done  . OPHTHALMOLOGY EXAM  06/21/2018  . COLON CANCER SCREENING ANNUAL FOBT  01/22/2019  . PNA vac Low Risk Adult (2 of 2 - PCV13) 05/07/2021  . INFLUENZA VACCINE  07/12/2021  . HEMOGLOBIN A1C  09/17/2021  . FOOT EXAM  01/14/2022  . TETANUS/TDAP  10/02/2024  . COVID-19 Vaccine  Completed  . Hepatitis C Screening  Completed  . HPV VACCINES  Aged Out   Schedule an apt with PCP in July for diabetes.  Complete an advance directive packet.  Schedule your eye exam. You are eligible for #2 covid booster.  Preventive Care 74 Years and Older, Male Preventive care refers to lifestyle  choices and visits with your health care provider that can promote health and wellness. This includes:  A yearly physical exam. This is also called an annual wellness visit.  Regular dental and eye exams.  Immunizations.  Screening for certain conditions.  Healthy lifestyle choices, such as: ? Eating a healthy diet. ? Getting regular exercise. ? Not using drugs or products  that contain nicotine and tobacco. ? Limiting alcohol use. What can I expect for my preventive care visit? Physical exam Your health care provider will check your:  Height and weight. These may be used to calculate your BMI (body mass index). BMI is a measurement that tells if you are at a healthy weight.  Heart rate and blood pressure.  Body temperature.  Skin for abnormal spots. Counseling Your health care provider may ask you questions about your:  Past medical problems.  Family's medical history.  Alcohol, tobacco, and drug use.  Emotional well-being.  Home life and relationship well-being.  Sexual activity.  Diet, exercise, and sleep habits.  History of falls.  Memory and ability to understand (cognition).  Work and work Astronomer.  Access to firearms. What immunizations do I need? Vaccines are usually given at various ages, according to a schedule. Your health care provider will recommend vaccines for you based on your age, medical history, and lifestyle or other factors, such as travel or where you work.   What tests do I need? Blood tests  Lipid and cholesterol levels. These may be checked every 5 years, or more often depending on your overall health.  Hepatitis C test.  Hepatitis B test. Screening  Lung cancer screening. You may have this screening every year starting at age 58 if you have a 30-pack-year history of smoking and currently smoke or have quit within the past 15 years.  Colorectal cancer screening. ? All adults should have this screening starting at age 47 and continuing until age 43. ? Your health care provider may recommend screening at age 56 if you are at increased risk. ? You will have tests every 1-10 years, depending on your results and the type of screening test.  Prostate cancer screening. Recommendations will vary depending on your family history and other risks.  Genital exam to check for testicular cancer or  hernias.  Diabetes screening. ? This is done by checking your blood sugar (glucose) after you have not eaten for a while (fasting). ? You may have this done every 1-3 years.  Abdominal aortic aneurysm (AAA) screening. You may need this if you are a current or former smoker.  STD (sexually transmitted disease) testing, if you are at risk. Follow these instructions at home: Eating and drinking  Eat a diet that includes fresh fruits and vegetables, whole grains, lean protein, and low-fat dairy products. Limit your intake of foods with high amounts of sugar, saturated fats, and salt.  Take vitamin and mineral supplements as recommended by your health care provider.  Do not drink alcohol if your health care provider tells you not to drink.  If you drink alcohol: ? Limit how much you have to 0-2 drinks a day. ? Be aware of how much alcohol is in your drink. In the [place] one drink equals one 12 oz bottle of beer (355 mL), one 5 oz glass of wine (148 mL), or one 1 oz glass of hard liquor (44 mL).   Lifestyle  Take daily care of your teeth and gums. Brush your teeth every morning and night with fluoride  toothpaste. Floss one time each day.  Stay active. Exercise for at least 30 minutes 5 or more days each week.  Do not use any products that contain nicotine or tobacco, such as cigarettes, e-cigarettes, and chewing tobacco. If you need help quitting, ask your health care provider.  Do not use drugs.  If you are sexually active, practice safe sex. Use a condom or other form of protection to prevent STIs (sexually transmitted infections).  Talk with your health care provider about taking a low-dose aspirin or statin.  Find healthy ways to cope with stress, such as: ? Meditation, yoga, or listening to music. ? Journaling. ? Talking to a trusted person. ? Spending time with friends and family. Safety  Always wear your seat belt while driving or riding in a vehicle.  Do not drive: ? If  you have been drinking alcohol. Do not ride with someone who has been drinking. ? When you are tired or distracted. ? While texting.  Wear a helmet and other protective equipment during sports activities.  If you have firearms in your house, make sure you follow all gun safety procedures. What's next?  Visit your health care provider once a year for an annual wellness visit.  Ask your health care provider how often you should have your eyes and teeth checked.  Stay up to date on all vaccines. This information is not intended to replace advice given to you by your health care provider. Make sure you discuss any questions you have with your health care provider. Document Revised: 08/27/2019 Document Reviewed: 11/22/2018 Elsevier Patient Education  2021 Elsevier Inc.  Our clinic's number is [phone]. Please call with questions or concerns about what we discussed today.

## 2021-04-21 NOTE — Progress Notes (Signed)
Subjective:   Colin Keller is a 65 y.o. male who presents for Medicare Annual/Subsequent preventive examination.  The patient consented to a virtual visit. Patient consented to have virtual visit and was identified by name and date of birth. Method of visit: Telephone   Encounter participants: Patient: Colin Keller - located at Home Nurse/Provider: Steva Colder - located at [facility] Others (if applicable): NA  Review of Systems: Defer to PCP  Cardiac Risk Factors include: advanced age (>96men, >60 women);diabetes mellitus;hypertension  Objective:    Vitals: Ht 6' (1.829 m)   Wt 155 lb (70.3 kg)   BMI 21.02 kg/m   Body mass index is 21.02 kg/m.  Advanced Directives 04/21/2021 03/18/2021 01/14/2021 12/10/2020 05/07/2020 03/26/2020 07/05/2019  Does Patient Have a Medical Advance Directive? No No No No No No No  Does patient want to make changes to medical advance directive? - - - - - - -  Would patient like information on creating a medical advance directive? No - Patient declined No - Patient declined Yes (MAU/Ambulatory/Procedural Areas - Information given) No - Patient declined No - Patient declined No - Patient declined No - Patient declined   Tobacco Social History   Tobacco Use  Smoking Status Former Smoker  . Packs/day: 1.00  . Years: 48.00  . Pack years: 48.00  . Types: Cigarettes  . Start date: 08/12/1970  . Quit date: 08/12/2018  . Years since quitting: 2.6  Smokeless Tobacco Never Used  Tobacco Comment   Quit post CABG in 2019 - previous 1ppd     Counseling given: no plans to restart Comment: Quit post CABG in 2019 - previous 1ppd  Clinical Intake:  Pre-visit preparation completed: Yes  Pain Score: 5   How often do you need to have someone help you when you read instructions, pamphlets, or other written materials from your doctor or pharmacy?: 2 - Rarely What is the last grade level you completed in school?: 10th grade  Interpreter Needed?: No  Past Medical  History:  Diagnosis Date  . Acute diastolic heart failure (HCC) 08/18/2018  . Atherosclerosis of both carotid arteries 08/18/2020   Carotid dopplers 08/2019 40-59% bilateral stenosis and right subclavian stenosis  . CHF (congestive heart failure) (HCC)   . Chronic low back pain without sciatica 10/16/2008   H/o chronic LBP with h/o lumbar disc herniation and intermittent radicular pain  Chronic pain, on stable doses of Norco 10/325 TID. MRI in 2003 showing SMALL CENTRAL DISC HERNIATION AT L4-5.  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     . CKD (chronic kidney disease), stage III (HCC)   . Coronary artery disease   . Diabetes mellitus   . Dyspnea   . GERD (gastroesophageal reflux disease)   . Hx of CABG 08/23/2018   LIMA to LAD, RIMA to RCA, SVG to OM3, EVH via right thigh  . Hyperlipidemia   . Hypertension   . Hypertension associated with diabetes (HCC) 12/22/2008   Qualifier: Diagnosis of  By: Lafonda Mosses MD, Darl Pikes    . Hypertensive nephropathy 08/18/2020  . Insulin dependent type 2 diabetes mellitus (HCC) 12/22/2020  . Proteinuria 08/18/2020  . 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 Subclavian artery stenosis, right (HCC) 08/18/2020   Carotid dopplers 08/2019 40-59% bilateral stenosis and right subclavian stenosis  . Tobacco abuse   . Tobacco abuse disorder    Qualifier:  Diagnosis of  By: Lafonda Mosses MD, Darl Pikes     Past Surgical History:  Procedure Laterality Date  . CORONARY ARTERY BYPASS GRAFT N/A 08/23/2018   Procedure: CORONARY ARTERY BYPASS GRAFTING (CABG) x 3; -Left Internal Mammary Artery to Left Anterior Descending Artery, -Right Internal Mammary Artery to Right Coronary Artery, -Saphenous Vein Graft to Obtuse Marginal;  ENDOSCOPIC HARVEST GREATER SAPHENOUS VEIN  -Right Thigh;  Surgeon: Purcell Nails, MD;  Location: [facility] OR;  Service: Open Heart Surgery;  Laterality: N/A;  . INTRAVASCULAR PRESSURE WIRE/FFR STUDY N/A  08/20/2018   Procedure: INTRAVASCULAR PRESSURE WIRE/FFR STUDY;  Surgeon: Tonny Bollman, MD;  Location: [facility] INVASIVE CV LAB;  Service: Cardiovascular;  Laterality: N/A;  . LEFT HEART CATH AND CORONARY ANGIOGRAPHY N/A 08/20/2018   Procedure: LEFT HEART CATH AND CORONARY ANGIOGRAPHY;  Surgeon: Tonny Bollman, MD;  Location: [facility] INVASIVE CV LAB;  Service: Cardiovascular;  Laterality: N/A;  . TEE WITHOUT CARDIOVERSION N/A 08/23/2018   Procedure: TRANSESOPHAGEAL ECHOCARDIOGRAM (TEE);  Surgeon: Purcell Nails, MD;  Location: [facility] OR;  Service: Open Heart Surgery;  Laterality: N/A;   Family History  Problem Relation Age of Onset  . Heart disease Mother   . Diabetes Mother   . Heart failure Mother   . Heart disease Father   . Sudden death Brother   . Drug abuse Son    Social History   Socioeconomic History  . Marital status: Married    Spouse name: Lawson Fiscal   . Number of children: 2  . Years of education: 10  . Highest education level: 10th grade  Occupational History  . Occupation: Music therapist  Tobacco Use  . Smoking status: Former Smoker    Packs/day: 1.00    Years: 48.00    Pack years: 48.00    Types: Cigarettes    Start date: 08/12/1970    Quit date: 08/12/2018    Years since quitting: 2.6  . Smokeless tobacco: Never Used  . Tobacco comment: Quit post CABG in 2019 - previous 1ppd  Vaping Use  . Vaping Use: Never used  Substance and Sexual Activity  . Alcohol use: No    Alcohol/week: 0.0 standard drinks  . Drug use: No  . Sexual activity: Yes    Birth control/protection: Post-menopausal  Other Topics Concern  . Not on file  Social History Narrative   Lives with his wife in [place].    Wife Lawson Fiscal is also [facility] patient.   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    Previously used marijuana -quit 10/03. Previous 1ppd smoker- quit 2019.   Surrogate decision maker: Joziah Dollins (wife)   Patient has one daughter.   Patients son died of a drug overdose March 09, 2021.   Patient has 2 dogs and 2 cats.        Social Determinants of Health   Financial Resource Strain: Low Risk   . Difficulty of Paying Living Expenses: Not hard at all  Food Insecurity: No Food Insecurity  . Worried About Programme researcher, broadcasting/film/video in the Last Year: Never true  . Ran Out of Food in the Last Year: Never true  Transportation Needs: No Transportation Needs  . Lack of Transportation (Medical): No  . Lack of Transportation (Non-Medical): No  Physical Activity: Sufficiently Active  . Days of Exercise per Week: 5 days  . Minutes of Exercise per Session: 30 min  Stress: No Stress Concern Present  . Feeling of Stress : Not at all  Social Connections: Moderately Isolated  . Frequency of Communication with Friends  and Family: More than three times a week  . Frequency of Social Gatherings with Friends and Family: More than three times a week  . Attends Religious Services: Never  . Active Member of Clubs or Organizations: No  . Attends Banker Meetings: Never  . Marital Status: Married   Outpatient Encounter Medications as of 04/21/2021  Medication Sig  . Alirocumab (PRALUENT) 150 MG/ML SOAJ Inject 150 mg into the skin every 14 (fourteen) days.  Marland Kitchen aspirin 81 MG tablet Take 81 mg by mouth daily.  . dapagliflozin propanediol (FARXIGA) 10 MG TABS tablet Take 1 tablet (10 mg total) by mouth daily.  . Famotidine 20 MG CHEW Chew 20 mg by mouth daily as needed (for reflux).  Marland Kitchen HYDROcodone-acetaminophen (NORCO) 10-325 MG tablet Take 1 tablet by mouth 2 (two) times daily as needed.  . insulin aspart (NOVOLOG FLEXPEN) 100 UNIT/ML FlexPen Inject 4-6 Units into the skin 2 (two) times daily with a meal.  . Insulin Glargine (BASAGLAR KWIKPEN) 100 UNIT/ML Inject 18 Units into the skin daily before breakfast.  . metFORMIN (GLUCOPHAGE) 500 MG tablet TAKE 2 TABLETS BY MOUTH TWICE DAILY WITHA MEAL  . metoprolol tartrate (LOPRESSOR) 50 MG tablet TAKE 1 TABLET BY MOUTH TWICE DAILY  . tamsulosin (FLOMAX) 0.4 MG CAPS capsule TAKE 1  CAPSULE BY MOUTH DAILY  . tizanidine (ZANAFLEX) 2 MG capsule Take 1-2 capsules (2-4 mg total) by mouth 3 (three) times daily.  . hydrALAZINE (APRESOLINE) 25 MG tablet Take 1 tablet (25 mg total) by mouth 3 (three) times daily.   No facility-administered encounter medications on file as of 04/21/2021.   Activities of Daily Living In your present state of health, do you have any difficulty performing the following activities: 04/21/2021  Hearing? N  Vision? N  Difficulty concentrating or making decisions? N  Walking or climbing stairs? N  Dressing or bathing? N  Doing errands, shopping? N  Preparing Food and eating ? N  Using the Toilet? N  In the past six months, have you accidently leaked urine? N  Do you have problems with loss of bowel control? N  Managing your Medications? N  Managing your Finances? N  Housekeeping or managing your Housekeeping? N  Some recent data might be hidden   Patient Care Team: McDiarmid, Leighton Roach, MD as PCP - General (Family Medicine) Jens Som Madolyn Frieze, MD as PCP - Cardiology (Cardiology) Antony Contras, MD as Consulting Physician (Ophthalmology)   Assessment:   This is a routine wellness examination for Colin Keller.  Exercise Activities and Dietary recommendations Current Exercise Habits: The patient has a physically strenuous job, but has no regular exercise apart from work., Exercise limited by: orthopedic condition(s)  Goals    .   Acknowledge receipt of Medical illustrator    .  HEMOGLOBIN A1C < 8    .  I would like to manage my diabetes (pt-stated)      Current Barriers:  . Diabetes: T2DM; complicated by chronic medical conditions including HTN, HLD, CAD, HFpEF most recent A1c 11% on 06/06/19 . Current antihyperglycemic regimen: metformin 1 g BID, Tresiba (Insulin Degludec) 18 units daily. Novolog (Insulin Aspart) at a sliding scale of 4-6 units with breakfast and 4-6 units with dinner.  . Denies hypoglycemic symptoms (has experienced in the past,  counseled on hypoglyemic) . Reports hyperglycemic symptoms, including: polyuria, polydipsia,  nocturia . Current meal patterns: o Breakfast: grits, bacon, eggs o Lunch:crackers o Supper: incorporates protein, veggie, low carb o Snacks: rare, does  not like sweets o Drinks: avoids sugary drinks, water coffee . 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 Current blood glucose readings: FBGs 180-200; pt reports BG is consistently high; one reported FBG of 74 because he skipped dinner o Attributes high Bgs to lack of physical activity . Cardiovascular risk reduction: o Current hypertensive regimen: metoprolol BID o Current hyperlipidemia regimen: Repatha-starting this month (due for LDL, seeing heart care PharmD for lipids) o Current antiplatelet regimen: ASA  Pharmacist Clinical Goal(s):  Marland Kitchen. Over the next 90 days, patient will work with PharmD and primary care provider to address related to diabetes management  Interventions: . Comprehensive medication review performed, medication list updated in electronic medical record . Continue Tresiba 18 units daily. Continue other medications as above o Applied for patient assistance to obtain Basaglar, Humalog via [employer] . Attempting to get SGLT2 added to regimen once barriers removed (financial).  Unable to apply for Jardiance of Marcelline DeistFarxiga based on patient's financial situation.  Will attempt to get Staglatro using special copay card (max savings $585 per RX)  Patient Self Care Activities:  . Patient will check blood glucose 3x daily, document, and provide at future appointments . Patient will focus on medication adherence  . Patient will take medications as prescribed . Patient will contact provider with any episodes of hypoglycemia . Patient will report any questions or concerns to provider   Please see past updates related to this goal by clicking on the "Past Updates" button in the selected goal      .  Quit Smoking    .  Quit  smoking / using tobacco      Fall Risk Fall Risk  04/21/2021 01/14/2021 05/07/2020 03/26/2020 06/06/2019  Falls in the past year? 0 0 0 0 0  Number falls in past yr: - 0 - - 0  Injury with Fall? - 0 - - -  Follow up Falls prevention discussed - - - Falls evaluation completed   Is the patient's home free of loose throw rugs in walkways, pet beds, electrical cords, etc?   yes      Grab bars in the bathroom? yes      Handrails on the stairs?   yes      Adequate lighting?   yes  Patient rating of health (0-10): 7  Depression Screen PHQ 2/9 Scores 04/21/2021 03/18/2021 01/14/2021 12/10/2020  PHQ - 2 Score 0 0 0 0  PHQ- 9 Score 4 6 0 5   Cognitive Function  6CIT Screen 04/21/2021  What Year? 0 points  What month? 0 points  What time? 0 points  Count back from 20 0 points  Months in reverse 0 points  Repeat phrase 0 points  Total Score 0   Immunization History  Administered Date(s) Administered  . Fluad Quad(high Dose 65+) 12/10/2020  . Influenza Whole 10/16/2008  . Influenza,inj,Quad PF,6+ Mos 10/02/2013, 08/28/2018  . PFIZER(Purple Top)SARS-COV-2 Vaccination 12/31/2019, 01/31/2020, 12/10/2020  . Pneumococcal Polysaccharide-23 05/07/2020  . Tdap 10/02/2014   Screening Tests Health Maintenance  Topic Date Due  . COLONOSCOPY (Pts 45-4534yrs Insurance coverage will need to be confirmed)  Never done  . OPHTHALMOLOGY EXAM  06/21/2018  . COLON CANCER SCREENING ANNUAL FOBT  01/22/2019  . PNA vac Low Risk Adult (2 of 2 - PCV13) 05/07/2021  . INFLUENZA VACCINE  07/12/2021  . HEMOGLOBIN A1C  09/17/2021  . FOOT EXAM  01/14/2022  . TETANUS/TDAP  10/02/2024  . COVID-19 Vaccine  Completed  . Hepatitis C  Screening  Completed  . HPV VACCINES  Aged Out   Cancer Screenings: Lung: Low Dose CT Chest recommended if Age 29-80 years, 30 pack-year currently smoking OR have quit w/in 15years. Patient does not qualify. Colorectal: 2019-patient does fecal cards.  Additional Screenings: Hepatitis C  Screening: Completed  HIV Screening: Completed   Plan:  Schedule an apt with PCP in July for diabetes.  Complete an advance directive packet.  Schedule your eye exam. You are eligible for #2 covid booster.  I have personally reviewed and noted the following in the patient's chart:   . Medical and social history . Use of alcohol, tobacco or illicit drugs  . Current medications and supplements . Functional ability and status . Nutritional status . Physical activity . Advanced directives . List of other physicians . Hospitalizations, surgeries, and ER visits in previous 12 months . Vitals . Screenings to include cognitive, depression, and falls . Referrals and appointments  In addition, I have reviewed and discussed with patient certain preventive protocols, quality metrics, and best practice recommendations. A written personalized care plan for preventive services as well as general preventive health recommendations were provided to patient.  This visit was conducted virtually in the setting of the COVID19 pandemic.   Steva Colder, CMA  04/21/2021

## 2021-04-26 ENCOUNTER — Other Ambulatory Visit: Payer: Self-pay | Admitting: Family Medicine

## 2021-04-26 DIAGNOSIS — E1165 Type 2 diabetes mellitus with hyperglycemia: Secondary | ICD-10-CM

## 2021-04-26 NOTE — Progress Notes (Signed)
I have reviewed this visit and agree with the documentation.   

## 2021-04-30 ENCOUNTER — Other Ambulatory Visit: Payer: Self-pay | Admitting: Family Medicine

## 2021-04-30 DIAGNOSIS — G8929 Other chronic pain: Secondary | ICD-10-CM

## 2021-04-30 DIAGNOSIS — M545 Low back pain, unspecified: Secondary | ICD-10-CM

## 2021-05-05 ENCOUNTER — Other Ambulatory Visit: Payer: Self-pay | Admitting: Family Medicine

## 2021-05-07 DIAGNOSIS — E1165 Type 2 diabetes mellitus with hyperglycemia: Secondary | ICD-10-CM | POA: Diagnosis not present

## 2021-05-11 ENCOUNTER — Other Ambulatory Visit: Payer: Self-pay | Admitting: Family Medicine

## 2021-05-11 DIAGNOSIS — E1165 Type 2 diabetes mellitus with hyperglycemia: Secondary | ICD-10-CM

## 2021-05-11 MED ORDER — FREESTYLE LIBRE 2 SENSOR MISC
11 refills | Status: DC
Start: 2021-05-11 — End: 2022-01-24

## 2021-05-21 ENCOUNTER — Telehealth: Payer: Self-pay | Admitting: Cardiology

## 2021-05-21 ENCOUNTER — Telehealth: Payer: Self-pay

## 2021-05-21 NOTE — Telephone Encounter (Signed)
Spoke with pt regarding refills of insulin pens. Pt advised to call his PCP for insulin refills. Pt states that he forgets where he gets some prescriptions from. Pt verbalizes understanding.

## 2021-05-21 NOTE — Telephone Encounter (Signed)
Patient calls nurse line stating he needs to submit a current application to get his Basaglar. Patient reports he is down to a 1.5 pens. Patient stated he filled out the last application "sometime ago." Will forward to pharmacy team for assistance.

## 2021-05-21 NOTE — Telephone Encounter (Signed)
Pt c/o medication issue:  1. Name of Medication: Insulin Glargine (BASAGLAR KWIKPEN) 100 UNIT/ML  2. How are you currently taking this medication (dosage and times per day)? As prescribed  3. Are you having a reaction (difficulty breathing--STAT)? No  4. What is your medication issue? PT is calling he only has one needle left.PT is wanting to know what does he need to do to get more delivered to him.

## 2021-05-24 NOTE — Telephone Encounter (Signed)
Patient comes to [facility] to sign [employer] application. Patient signed the form, however did not bring proof of income. Patient reported he did not file taxes and does not have proof of income. Signed form placed back in [employer].

## 2021-05-25 ENCOUNTER — Encounter: Payer: Self-pay | Admitting: Family Medicine

## 2021-05-25 DIAGNOSIS — Z794 Long term (current) use of insulin: Secondary | ICD-10-CM | POA: Insufficient documentation

## 2021-05-28 NOTE — Progress Notes (Signed)
Received notification from [facility] CARES regarding approval for HUMALOG & BASAGLAR. Patient assistance approved from 05/27/21 to 12/11/21.  Medications will ship 3-4 month supplys to pt's home.  Phone: [phone]

## 2021-05-31 ENCOUNTER — Other Ambulatory Visit: Payer: Self-pay | Admitting: Family Medicine

## 2021-05-31 DIAGNOSIS — G8929 Other chronic pain: Secondary | ICD-10-CM

## 2021-05-31 DIAGNOSIS — M545 Low back pain, unspecified: Secondary | ICD-10-CM

## 2021-06-07 DIAGNOSIS — E1165 Type 2 diabetes mellitus with hyperglycemia: Secondary | ICD-10-CM | POA: Diagnosis not present

## 2021-06-17 ENCOUNTER — Other Ambulatory Visit: Payer: Self-pay

## 2021-06-17 DIAGNOSIS — E1165 Type 2 diabetes mellitus with hyperglycemia: Secondary | ICD-10-CM

## 2021-06-18 MED ORDER — METFORMIN HCL 500 MG PO TABS: ORAL_TABLET | ORAL | 3 refills | Status: DC

## 2021-06-28 ENCOUNTER — Other Ambulatory Visit: Payer: Self-pay | Admitting: Family Medicine

## 2021-06-28 DIAGNOSIS — G8929 Other chronic pain: Secondary | ICD-10-CM

## 2021-06-28 DIAGNOSIS — M545 Low back pain, unspecified: Secondary | ICD-10-CM

## 2021-07-08 DIAGNOSIS — E1165 Type 2 diabetes mellitus with hyperglycemia: Secondary | ICD-10-CM | POA: Diagnosis not present

## 2021-07-22 NOTE — Progress Notes (Signed)
HPI: Follow-up coronary artery disease.  Previously admitted to [facility] with non-ST elevation myocardial infarction.  Echocardiogram September 2019 showed ejection fraction 50 to 55% and mild diastolic dysfunction.  Cardiac catheterization September 2019 showed severe coronary disease and he ultimately had coronary artery bypass and graft with a LIMA to the LAD, RIMA to the right coronary artery and saphenous vein graft to the obtuse marginal. He had problems with hyperkalemia postoperatively.  Also with chronic stage III kidney disease.  Echo 10/19 showed EF 50, mild MR and biatrial enlargement. Abdominal ultrasound December 2020 showed no aneurysm.  There was greater than 50% stenosis on the right external iliac and left common iliac arteries.  Carotid Dopplers September 2021 showed 60 to 79% right and 40 to 59% left.  Since he was last seen there is no dyspnea, chest pain, palpitations or syncope.  He does note bilateral lower extremity claudication after walking approximately 100 yards.  Current Outpatient Medications  Medication Sig Dispense Refill   Alirocumab (PRALUENT) 150 MG/ML SOAJ Inject 150 mg into the skin every 14 (fourteen) days. 2 mL 11   aspirin 81 MG tablet Take 81 mg by mouth daily.     Continuous Blood Gluc Sensor (FREESTYLE LIBRE 2 SENSOR) MISC As directed and replace sensor every 14 days. 2 each 11   Famotidine 20 MG CHEW Chew 20 mg by mouth daily as needed (for reflux).     HYDROcodone-acetaminophen (NORCO) 10-325 MG tablet TAKE 1 TABLET BY MOUTH TWICE DAILY AS NEEDED 60 tablet 0   insulin aspart (NOVOLOG FLEXPEN) 100 UNIT/ML FlexPen Inject 4-6 Units into the skin 2 (two) times daily with a meal.     Insulin Glargine (BASAGLAR KWIKPEN) 100 UNIT/ML Inject 18 Units into the skin daily before breakfast.     metFORMIN (GLUCOPHAGE) 500 MG tablet TAKE 2 TABLETS BY MOUTH TWICE DAILY WITHA MEAL 180 tablet 3   metoprolol tartrate (LOPRESSOR) 50 MG tablet TAKE 1 TABLET  BY MOUTH TWICE DAILY 180 tablet 3   tamsulosin (FLOMAX) 0.4 MG CAPS capsule TAKE 1 CAPSULE BY MOUTH DAILY 90 capsule 3   hydrALAZINE (APRESOLINE) 25 MG tablet Take 1 tablet (25 mg total) by mouth 3 (three) times daily. (Patient taking differently: Take 25 mg by mouth 2 (two) times daily.) 270 tablet 3   No current facility-administered medications for this visit.     Past Medical History:  Diagnosis Date   Acute diastolic heart failure (HCC) 08/18/2018   Atherosclerosis of both carotid arteries 08/18/2020   Carotid dopplers 08/2019 40-59% bilateral stenosis and right subclavian stenosis   CHF (congestive heart failure) (HCC)    Chronic low back pain without sciatica 10/16/2008   H/o chronic LBP with h/o lumbar disc herniation and intermittent radicular pain  Chronic pain, on stable doses of Norco 10/325 TID. MRI in 2003 showing SMALL CENTRAL DISC HERNIATION AT L4-5.  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   LIMA to LAD, RIMA to RCA, SVG to OM3, EVH via right thigh   Subclavian artery stenosis, right (HCC) 08/18/2020   Carotid dopplers 08/2019 40-59% bilateral stenosis and right subclavian stenosis   Tobacco abuse    Tobacco abuse disorder    Qualifier: Diagnosis of  By: Lafonda Mosses MD, Darl Pikes      Past Surgical History:  Procedure Laterality Date   CORONARY ARTERY BYPASS GRAFT N/A 08/23/2018   Procedure: CORONARY ARTERY BYPASS GRAFTING (CABG) x 3;  -Left Internal Mammary Artery to Left Anterior Descending Artery, -Right Internal Mammary Artery to Right Coronary Artery, -Saphenous Vein Graft to Obtuse Marginal;  ENDOSCOPIC HARVEST GREATER SAPHENOUS VEIN  -Right Thigh;  Surgeon: Purcell Nails, MD;  Location: [facility] OR;  Service: Open Heart Surgery;  Laterality: N/A;   INTRAVASCULAR PRESSURE WIRE/FFR STUDY N/A 08/20/2018   Procedure: INTRAVASCULAR PRESSURE WIRE/FFR STUDY;  Surgeon: Tonny Bollman, MD;  Location: [facility] INVASIVE CV LAB;  Service: Cardiovascular;  Laterality: N/A;   LEFT HEART CATH AND CORONARY ANGIOGRAPHY N/A 08/20/2018   Procedure: LEFT HEART CATH AND CORONARY ANGIOGRAPHY;  Surgeon: Tonny Bollman, MD;  Location: [facility] INVASIVE CV LAB;  Service: Cardiovascular;  Laterality: N/A;   TEE WITHOUT CARDIOVERSION N/A 08/23/2018   Procedure: TRANSESOPHAGEAL ECHOCARDIOGRAM (TEE);  Surgeon: Purcell Nails, MD;  Location: [facility] OR;  Service: Open Heart Surgery;  Laterality: N/A;    Social History   Socioeconomic History   Marital status: Married    Spouse name: Lawson Fiscal    Number of children: 2   Years of education: 10   Highest education level: 10th grade  Occupational History   Occupation: Music therapist  Tobacco Use   Smoking status: Former    Packs/day: 1.00    Years: 48.00    Pack years: 48.00    Types: Cigarettes    Start date: 08/12/1970    Quit date: 08/12/2018    Years since quitting: 2.9   Smokeless tobacco: Never   Tobacco comments:    Quit post CABG in 2019 - previous 1ppd  Vaping Use   Vaping Use: Never used  Substance and Sexual Activity   Alcohol use: No    Alcohol/week: 0.0 standard drinks   Drug use: No   Sexual activity: Yes    Birth control/protection: Post-menopausal  Other Topics Concern   Not on file  Social History Narrative   Lives with his wife in [place].    Wife Lawson Fiscal is also [facility] patient.   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    Previously used marijuana -quit 10/03. Previous 1ppd smoker- quit 2019.   Surrogate  decision maker: Mayra Brahm (wife)   Patient has one daughter.   Patients son died of a drug overdose 03/28/2021.   Patient has 2 dogs and 2 cats.       Social Determinants of Health   Financial Resource Strain: Low Risk    Difficulty of Paying Living Expenses: Not hard at all  Food Insecurity: No Food Insecurity   Worried About Programme researcher, broadcasting/film/video in the Last Year: Never true   Ran Out of Food in the Last Year: Never true  Transportation Needs: No Transportation Needs   Lack of Transportation (Medical): No   Lack of Transportation (Non-Medical): No  Physical Activity: Sufficiently Active   Days of Exercise per Week: 5 days   Minutes of Exercise per Session: 30 min  Stress:  No Stress Concern Present   Feeling of Stress : Not at all  Social Connections: Moderately Isolated   Frequency of Communication with Friends and Family: More than three times a week   Frequency of Social Gatherings with Friends and Family: More than three times a week   Attends Religious Services: Never   Database administrator or Organizations: No   Attends Engineer, structural: Never   Marital Status: Married  Catering manager Violence: Not At Risk   Fear of Current or Ex-Partner: No   Emotionally Abused: No   Physically Abused: No   Sexually Abused: No    Family History  Problem Relation Age of Onset   Heart disease Mother    Diabetes Mother    Heart failure Mother    Heart disease Father    Sudden death Brother    Drug abuse Son     ROS: no fevers or chills, productive cough, hemoptysis, dysphasia, odynophagia, melena, hematochezia, dysuria, hematuria, rash, seizure activity, orthopnea, PND, pedal edema. Remaining systems are negative.  Physical Exam: Well-developed well-nourished in no acute distress.  Skin is warm and dry.  HEENT is normal.  Neck is supple.  Chest is clear to auscultation with normal expansion.  Cardiovascular exam is regular rate and rhythm.  Abdominal exam  nontender or distended. No masses palpated. Extremities show no edema. neuro grossly intact  ECG-normal sinus rhythm at a rate of 60, normal axis, nonspecific ST changes.  Personally reviewed  A/P  1 coronary artery disease-patient denies chest pain.  Continue aspirin.  He is intolerant to statins.  2 hypertension-blood pressure elevated; increase hydralazine to 50 mg twice daily and follow.  3 hyperlipidemia-patient is intolerant to statins.  Continue praluent.  Check lipids and liver.  4 carotid artery disease-patient will need follow-up carotid Doppler September 2023.  5 Chronic combined systolic/diastolic congestive heart failure-he is euvolemic on examination today.   6 chronic stage III kidney disease-patient is followed now by nephrology.  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  Continue praluent.  8 claudication-we will arrange ABIs with Doppler.  Olga Millers, MD

## 2021-07-26 ENCOUNTER — Other Ambulatory Visit: Payer: Self-pay | Admitting: Family Medicine

## 2021-07-26 DIAGNOSIS — M545 Low back pain, unspecified: Secondary | ICD-10-CM

## 2021-07-26 DIAGNOSIS — G8929 Other chronic pain: Secondary | ICD-10-CM

## 2021-08-02 NOTE — Progress Notes (Signed)
[facility])                                            [facility] Quality Pharmacy Team                                        Statin Quality Measure Assessment    08/02/2021  Colin Keller 1956-08-13 [id number]  Per review of chart and payor information, this patient has been flagged for non-adherence to the following CMS Quality Measure:   [x]  Statin Use in Persons with Diabetes  [x]  Statin Use in Persons with Cardiovascular Disease   Currently prescribed statin:  []  Yes [x]  No     Comments: Intolerant due to myalgias. Earlier this year, patient assistance for PCSK9i was in process. Per medication dispense history in Epic,he has been filling [facility].  History of statin use:            [x]  Yes []  No   Comments: Per prior documentation, he has failed statins due to myalgias.   Please consider the following recommendations:   Code for past statin intolerance (required annually) Consider using code G72.0 (Drug Induced Myopathy) at next o/v to remove patient from the [facility] and SUPD measures.    Thank you for your time, , PharmD Clinical Pharmacist [facility] [phone]

## 2021-08-03 ENCOUNTER — Ambulatory Visit: Payer: Medicare HMO | Admitting: Cardiology

## 2021-08-03 ENCOUNTER — Other Ambulatory Visit: Payer: Self-pay

## 2021-08-03 ENCOUNTER — Encounter: Payer: Self-pay | Admitting: Cardiology

## 2021-08-03 VITALS — BP 160/70 | HR 60 | Ht 71.0 in | Wt 151.6 lb

## 2021-08-03 DIAGNOSIS — T466X5A Adverse effect of antihyperlipidemic and antiarteriosclerotic drugs, initial encounter: Secondary | ICD-10-CM

## 2021-08-03 DIAGNOSIS — E1159 Type 2 diabetes mellitus with other circulatory complications: Secondary | ICD-10-CM | POA: Diagnosis not present

## 2021-08-03 DIAGNOSIS — G720 Drug-induced myopathy: Secondary | ICD-10-CM | POA: Diagnosis not present

## 2021-08-03 DIAGNOSIS — I152 Hypertension secondary to endocrine disorders: Secondary | ICD-10-CM

## 2021-08-03 DIAGNOSIS — Z951 Presence of aortocoronary bypass graft: Secondary | ICD-10-CM

## 2021-08-03 DIAGNOSIS — I771 Stricture of artery: Secondary | ICD-10-CM | POA: Diagnosis not present

## 2021-08-03 DIAGNOSIS — E1165 Type 2 diabetes mellitus with hyperglycemia: Secondary | ICD-10-CM | POA: Diagnosis not present

## 2021-08-03 DIAGNOSIS — I739 Peripheral vascular disease, unspecified: Secondary | ICD-10-CM

## 2021-08-03 DIAGNOSIS — E785 Hyperlipidemia, unspecified: Secondary | ICD-10-CM | POA: Diagnosis not present

## 2021-08-03 LAB — COMPREHENSIVE METABOLIC PANEL
ALT: 10 IU/L (ref 0–44)
AST: 12 IU/L (ref 0–40)
Albumin/Globulin Ratio: 2 (ref 1.2–2.2)
Albumin: 4.5 g/dL (ref 3.8–4.8)
Alkaline Phosphatase: 57 IU/L (ref 44–121)
BUN/Creatinine Ratio: 18 (ref 10–24)
BUN: 27 mg/dL (ref 8–27)
Bilirubin Total: 0.4 mg/dL (ref 0.0–1.2)
CO2: 22 mmol/L (ref 20–29)
Calcium: 9.6 mg/dL (ref 8.6–10.2)
Chloride: 103 mmol/L (ref 96–106)
Creatinine, Ser: 1.53 mg/dL — ABNORMAL HIGH (ref 0.76–1.27)
Globulin, Total: 2.3 g/dL (ref 1.5–4.5)
Glucose: 198 mg/dL — ABNORMAL HIGH (ref 65–99)
Potassium: 4.9 mmol/L (ref 3.5–5.2)
Sodium: 139 mmol/L (ref 134–144)
Total Protein: 6.8 g/dL (ref 6.0–8.5)
eGFR: 50 mL/min/{1.73_m2} — ABNORMAL LOW (ref >59)

## 2021-08-03 LAB — LIPID PANEL
Chol/HDL Ratio: 2 ratio (ref 0.0–5.0)
Cholesterol, Total: 125 mg/dL (ref 100–199)
HDL: 64 mg/dL (ref >39)
LDL Chol Calc (NIH): 41 mg/dL (ref 0–99)
Triglycerides: 111 mg/dL (ref 0–149)
VLDL Cholesterol Cal: 20 mg/dL (ref 5–40)

## 2021-08-03 MED ORDER — HYDRALAZINE HCL 50 MG PO TABS: 50.0000 mg | ORAL_TABLET | Freq: Two times a day (BID) | ORAL | 3 refills | Status: DC

## 2021-08-03 NOTE — Patient Instructions (Signed)
Medication Instructions:   INCREASE HYDRALAZINE TO 50 MG TWICE DAILY= 2 OF THE 25 MG TABLETS TWICE DAILY  *If you need a refill on your cardiac medications before your next appointment, please call your pharmacy*   Testing/Procedures:  Your physician has requested that you have an ankle brachial index (ABI). During this test an ultrasound and blood pressure cuff are used to evaluate the arteries that supply the arms and legs with blood. Allow thirty minutes for this exam. There are no restrictions or special instructions.   Your physician has requested that you have a carotid duplex. This test is an ultrasound of the carotid arteries in your neck. It looks at blood flow through these arteries that supply the brain with blood. Allow one hour for this exam. There are no restrictions or special instructions.    Follow-Up: At [facility], you and your health needs are our priority.  As part of our continuing mission to provide you with exceptional heart care, we have created designated Provider Care Teams.  These Care Teams include your primary Cardiologist (physician) and Advanced Practice Providers (APPs -  Physician Assistants and Nurse Practitioners) who all work together to provide you with the care you need, when you need it.  We recommend signing up for the patient portal called "MyChart".  Sign up information is provided on this After Visit Summary.  MyChart is used to connect with patients for Virtual Visits (Telemedicine).  Patients are able to view lab/test results, encounter notes, upcoming appointments, etc.  Non-urgent messages can be sent to your provider as well.   To learn more about what you can do with MyChart, go to [email or web address].    Your next appointment:   6 month(s)  The format for your next appointment:   In Person  Provider:   Olga Millers, MD

## 2021-08-08 DIAGNOSIS — E1165 Type 2 diabetes mellitus with hyperglycemia: Secondary | ICD-10-CM | POA: Diagnosis not present

## 2021-08-09 ENCOUNTER — Encounter: Payer: Self-pay | Admitting: *Deleted

## 2021-08-23 ENCOUNTER — Other Ambulatory Visit: Payer: Self-pay | Admitting: Family Medicine

## 2021-08-23 DIAGNOSIS — M545 Low back pain, unspecified: Secondary | ICD-10-CM

## 2021-08-23 DIAGNOSIS — G8929 Other chronic pain: Secondary | ICD-10-CM

## 2021-08-24 ENCOUNTER — Other Ambulatory Visit: Payer: Self-pay | Admitting: Family Medicine

## 2021-08-24 DIAGNOSIS — N401 Enlarged prostate with lower urinary tract symptoms: Secondary | ICD-10-CM

## 2021-08-30 ENCOUNTER — Other Ambulatory Visit (HOSPITAL_COMMUNITY): Payer: Self-pay | Admitting: Cardiology

## 2021-08-30 ENCOUNTER — Other Ambulatory Visit: Payer: Self-pay

## 2021-08-30 ENCOUNTER — Ambulatory Visit (HOSPITAL_BASED_OUTPATIENT_CLINIC_OR_DEPARTMENT_OTHER)
Admission: RE | Admit: 2021-08-30 | Discharge: 2021-08-30 | Disposition: A | Discharge status: Home / Self Care | Payer: Medicare HMO | Source: Ambulatory Visit | Attending: Cardiology | Admitting: Cardiology

## 2021-08-30 ENCOUNTER — Ambulatory Visit (HOSPITAL_COMMUNITY)
Admission: RE | Admit: 2021-08-30 | Discharge: 2021-08-30 | Disposition: A | Discharge status: Home / Self Care | Payer: Medicare HMO | Source: Ambulatory Visit | Attending: Internal Medicine | Admitting: Internal Medicine

## 2021-08-30 DIAGNOSIS — I6523 Occlusion and stenosis of bilateral carotid arteries: Secondary | ICD-10-CM | POA: Insufficient documentation

## 2021-08-30 DIAGNOSIS — I739 Peripheral vascular disease, unspecified: Secondary | ICD-10-CM | POA: Diagnosis not present

## 2021-08-31 ENCOUNTER — Encounter: Payer: Self-pay | Admitting: Family Medicine

## 2021-08-31 DIAGNOSIS — I739 Peripheral vascular disease, unspecified: Secondary | ICD-10-CM

## 2021-08-31 DIAGNOSIS — I70219 Atherosclerosis of native arteries of extremities with intermittent claudication, unspecified extremity: Secondary | ICD-10-CM | POA: Insufficient documentation

## 2021-08-31 DIAGNOSIS — I728 Aneurysm of other specified arteries: Secondary | ICD-10-CM | POA: Insufficient documentation

## 2021-08-31 DIAGNOSIS — I779 Disorder of arteries and arterioles, unspecified: Secondary | ICD-10-CM | POA: Insufficient documentation

## 2021-08-31 HISTORY — DX: Peripheral vascular disease, unspecified: I73.9

## 2021-09-01 ENCOUNTER — Other Ambulatory Visit: Payer: Self-pay | Admitting: *Deleted

## 2021-09-01 DIAGNOSIS — I6523 Occlusion and stenosis of bilateral carotid arteries: Secondary | ICD-10-CM

## 2021-09-08 DIAGNOSIS — E1165 Type 2 diabetes mellitus with hyperglycemia: Secondary | ICD-10-CM | POA: Diagnosis not present

## 2021-09-17 ENCOUNTER — Other Ambulatory Visit: Payer: Self-pay | Admitting: Family Medicine

## 2021-09-17 DIAGNOSIS — G8929 Other chronic pain: Secondary | ICD-10-CM

## 2021-09-17 DIAGNOSIS — M545 Low back pain, unspecified: Secondary | ICD-10-CM

## 2021-09-21 ENCOUNTER — Other Ambulatory Visit: Payer: Self-pay

## 2021-09-21 ENCOUNTER — Ambulatory Visit: Payer: Medicare HMO | Admitting: Cardiovascular Disease

## 2021-09-21 ENCOUNTER — Encounter: Payer: Self-pay | Admitting: Cardiovascular Disease

## 2021-09-21 VITALS — BP 171/62 | HR 56 | Ht 71.0 in | Wt 153.8 lb

## 2021-09-21 DIAGNOSIS — I6523 Occlusion and stenosis of bilateral carotid arteries: Secondary | ICD-10-CM

## 2021-09-21 DIAGNOSIS — I10 Essential (primary) hypertension: Secondary | ICD-10-CM

## 2021-09-21 DIAGNOSIS — I739 Peripheral vascular disease, unspecified: Secondary | ICD-10-CM

## 2021-09-21 DIAGNOSIS — E785 Hyperlipidemia, unspecified: Secondary | ICD-10-CM

## 2021-09-21 DIAGNOSIS — I2510 Atherosclerotic heart disease of native coronary artery without angina pectoris: Secondary | ICD-10-CM

## 2021-09-21 NOTE — Progress Notes (Signed)
Cardiology Office Note   Date:  09/21/2021   ID:  Colin Keller, Colin Keller, MRN [id number]  PCP:  McDiarmid, Leighton Roach, MD  Cardiologist: Dr. Jens Som  No chief complaint on file.     History of Present Illness: Colin Keller is a 65 y.o. male who was referred by Dr. Jens Som for evaluation management of peripheral arterial disease.  He has known history of coronary artery disease with previous non-ST elevation myocardial infarction in 2019.  Cardiac catheterization showed severe three-vessel coronary artery disease and he was treated with CABG.  He has chronic medical conditions include hyperlipidemia with intolerance to statins, chronic kidney disease, type 2 diabetes and carotid artery disease. Previous screening for abdominal aortic aneurysm in 2020 was negative.  Moderately elevated velocities were noted in the iliac arteries. He reports bilateral calf claudication that started about 6 months ago.  Its equal on both side and happens after walking about 100 yards.  He has no rest pain or lower extremity ulceration.  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  By the end of the day, his legs feel very tired. Recent noninvasive vascular studies showed an ABI of 0.76 on the right and 0.63 on the left.  Duplex on the right showed severe stenosis in the distal SFA.  On the left, there was severe stenosis affecting the profunda as well as the distal SFA.  He has been diabetic for at least 30 years.  He started smoking at the age of 58 and has quit few times but continues to smoke few cigarettes a day.  Past Medical History:  Diagnosis Date   Acute diastolic heart failure (HCC) 08/18/2018   Atherosclerosis of both carotid arteries 08/18/2020   Carotid dopplers 08/2019 40-59% bilateral stenosis and right subclavian stenosis   CHF (congestive heart failure) (HCC)    Chronic low back pain without sciatica 10/16/2008   H/o chronic LBP with h/o lumbar disc herniation and  intermittent radicular pain  Chronic pain, on stable doses of Norco 10/325 TID. MRI in 2003 showing SMALL CENTRAL DISC HERNIATION AT L4-5.  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      CKD (chronic kidney disease), stage III (HCC)    Coronary artery disease    Diabetes mellitus    Dyspnea    GERD (gastroesophageal reflux disease)    Hx of CABG 08/23/2018   LIMA to LAD, RIMA to RCA, SVG to OM3, EVH via right thigh   Hyperlipidemia    Hypertension    Hypertension associated with diabetes (HCC) 12/22/2008   Qualifier: Diagnosis of  By: Lafonda Mosses MD, Susan     Hypertensive nephropathy 08/18/2020   Insulin dependent type 2 diabetes mellitus (HCC) 12/22/2020   Peripheral artery disease (HCC) 08/31/2021   [facility] Vascular Study lower extremities 08/30/21:            Today's ABI  Today's TBI   Previous ABIPrevious TBI  +-------+-----------+-----------+------------+------------+  Right  0.76       0.31       1.03                      +-------+-----------+-----------+------------+------------+  Left   0.63       0.20       0.95                      +-------+-----------+---------   Proteinuria 08/18/2020  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   LIMA to LAD, RIMA to RCA, SVG to OM3, EVH via right thigh   Subclavian artery stenosis, right (HCC) 08/18/2020   Carotid dopplers 08/2019 40-59% bilateral stenosis and right subclavian stenosis   Tobacco abuse    Tobacco abuse disorder    Qualifier: Diagnosis of  By: Lafonda Mosses MD, Darl Pikes      Past Surgical History:  Procedure Laterality Date   CORONARY ARTERY BYPASS GRAFT N/A 08/23/2018   Procedure: CORONARY ARTERY BYPASS GRAFTING (CABG) x 3; -Left Internal Mammary Artery to Left Anterior Descending Artery, -Right Internal Mammary Artery to Right Coronary Artery, -Saphenous Vein Graft to Obtuse Marginal;  ENDOSCOPIC HARVEST GREATER SAPHENOUS VEIN  -Right Thigh;  Surgeon: Purcell Nails, MD;  Location: [facility] OR;  Service: Open Heart  Surgery;  Laterality: N/A;   INTRAVASCULAR PRESSURE WIRE/FFR STUDY N/A 08/20/2018   Procedure: INTRAVASCULAR PRESSURE WIRE/FFR STUDY;  Surgeon: Tonny Bollman, MD;  Location: [facility] INVASIVE CV LAB;  Service: Cardiovascular;  Laterality: N/A;   LEFT HEART CATH AND CORONARY ANGIOGRAPHY N/A 08/20/2018   Procedure: LEFT HEART CATH AND CORONARY ANGIOGRAPHY;  Surgeon: Tonny Bollman, MD;  Location: [facility] INVASIVE CV LAB;  Service: Cardiovascular;  Laterality: N/A;   TEE WITHOUT CARDIOVERSION N/A 08/23/2018   Procedure: TRANSESOPHAGEAL ECHOCARDIOGRAM (TEE);  Surgeon: Purcell Nails, MD;  Location: [facility] OR;  Service: Open Heart Surgery;  Laterality: N/A;     Current Outpatient Medications  Medication Sig Dispense Refill   Alirocumab (PRALUENT) 150 MG/ML SOAJ Inject 150 mg into the skin every 14 (fourteen) days. 2 mL 11   aspirin 81 MG tablet Take 81 mg by mouth daily.     Continuous Blood Gluc Sensor (FREESTYLE LIBRE 2 SENSOR) MISC As directed and replace sensor every 14 days. 2 each 11   Cyanocobalamin (VITAMIN B 12 PO) Take by mouth daily.     Famotidine 20 MG CHEW Chew 20 mg by mouth daily as needed (for reflux).     hydrALAZINE (APRESOLINE) 50 MG tablet Take 1 tablet (50 mg total) by mouth 2 (two) times daily. 180 tablet 3   HYDROcodone-acetaminophen (NORCO) 10-325 MG tablet TAKE 1 TABLET BY MOUTH TWICE DAILY AS NEEDED 60 tablet 0   insulin aspart (NOVOLOG FLEXPEN) 100 UNIT/ML FlexPen Inject 4-6 Units into the skin 2 (two) times daily with a meal.     Insulin Glargine (BASAGLAR KWIKPEN) 100 UNIT/ML Inject 18 Units into the skin daily before breakfast.     metFORMIN (GLUCOPHAGE) 500 MG tablet TAKE 2 TABLETS BY MOUTH TWICE DAILY WITHA MEAL 180 tablet 3   metoprolol tartrate (LOPRESSOR) 50 MG tablet TAKE 1 TABLET BY MOUTH TWICE DAILY 180 tablet 3   tamsulosin (FLOMAX) 0.4 MG CAPS capsule TAKE 1 CAPSULE BY MOUTH DAILY 90 capsule 3   No current facility-administered medications for this visit.     Allergies:   Amlodipine, Amitriptyline, Carvedilol, Nortriptyline hcl, and Simvastatin    Social History:  The patient  reports that he quit smoking about 3 years ago. His smoking use included cigarettes. He started smoking about 51 years ago. He has a 48.00 pack-year smoking history. He has never used smokeless tobacco. He reports that he does not drink alcohol and does not use drugs.   Family History:  The patient's family history includes Diabetes in his mother; Drug abuse in his son; Heart disease in his father and mother; Heart failure in his mother; Sudden death in his brother.    ROS:  Please see  the history of present illness.   Otherwise, review of systems are positive for none.   All other systems are reviewed and negative.    PHYSICAL EXAM: VS:  BP (!) 171/62 (BP Location: Left Arm, Patient Position: Sitting, Cuff Size: Normal)   Pulse (!) 56   Ht 5\' 11"  (1.803 m)   Wt 153 lb 12.8 oz (69.8 kg)   SpO2 99%   BMI 21.45 kg/m  , BMI Body mass index is 21.45 kg/m. GEN: Well nourished, well developed, in no acute distress  HEENT: normal  Neck: no JVD or masses.  Bilateral carotid bruits Cardiac: RRR; no murmurs, rubs, or gallops,no edema  Respiratory:  clear to auscultation bilaterally, normal work of breathing GI: soft, nontender, nondistended, + BS MS: no deformity or atrophy  Skin: warm and dry, no rash Neuro:  Strength and sensation are intact Psych: euthymic mood, full affect Vascular: Femoral pulses +2 bilaterally.  Distal pulses are not palpable   EKG:  EKG is ordered today. The ekg ordered today demonstrates sinus bradycardia with possible old inferior infarct.   Recent Labs: 03/18/2021: Magnesium 1.9 08/03/2021: ALT 10; BUN 27; Creatinine, Ser 1.53; Potassium 4.9; Sodium 139    Lipid Panel    Component Value Date/Time   CHOL 125 08/03/2021 0850   TRIG 111 08/03/2021 0850   HDL 64 08/03/2021 0850   CHOLHDL 2.0 08/03/2021 0850   CHOLHDL 3.0 08/19/2018  0603   VLDL 14 08/19/2018 0603   LDLCALC 41 08/03/2021 0850   LDLDIRECT 105 09/19/2016 0922      Wt Readings from Last 3 Encounters:  09/21/21 153 lb 12.8 oz (69.8 kg)  08/03/21 151 lb 9.6 oz (68.8 kg)  04/21/21 155 lb (70.3 kg)      No flowsheet data found.    ASSESSMENT AND PLAN:  1.  Peripheral arterial disease: Severe bilateral calf claudication due to severe SFA disease.  I discussed with him the natural history and management of claudication.  I discussed different management options including an attempted walking exercise program versus proceeding with angiography with possible endovascular intervention.  The patient wants to try exercise first and he was given written instructions.  I discussed with him the importance of controlling his risk factors especially tobacco use. Follow-up in 2 months and if there is no improvement in symptoms, will recommend proceeding with angiography and endovascular intervention but will have to minimize contrast given underlying chronic kidney disease.  2.  Coronary artery disease involving native coronary arteries without angina: He seems to be doing well from a cardiac standpoint.  3.  Hyperlipidemia: He is intolerant to statin but doing well with Praluent with most recent LDL of 41.  4.  Essential hypertension: His blood pressure has been running high lately, consider switching metoprolol to carvedilol.  5.  Carotid artery disease: Annual Doppler is recommended.  6.  Stage III chronic kidney disease: Seems to be stable.  7.  Tobacco use: I discussed the strong association between tobacco use and peripheral arterial disease and stroke and advised him to quit.    Disposition:   FU with me in 2 months  Signed,  06/21/21, MD  09/21/2021 8:54 AM    [facility]

## 2021-09-21 NOTE — Patient Instructions (Signed)
Medication Instructions:  No changes *If you need a refill on your cardiac medications before your next appointment, please call your pharmacy*   Lab Work: None ordered If you have labs (blood work) drawn today and your tests are completely normal, you will receive your results only by: MyChart Message (if you have MyChart) OR A paper copy in the mail If you have any lab test that is abnormal or we need to change your treatment, we will call you to review the results.   Testing/Procedures: None ordered   Follow-Up: At [facility], you and your health needs are our priority.  As part of our continuing mission to provide you with exceptional heart care, we have created designated Provider Care Teams.  These Care Teams include your primary Cardiologist (physician) and Advanced Practice Providers (APPs -  Physician Assistants and Nurse Practitioners) who all work together to provide you with the care you need, when you need it.  We recommend signing up for the patient portal called "MyChart".  Sign up information is provided on this After Visit Summary.  MyChart is used to connect with patients for Virtual Visits (Telemedicine).  Patients are able to view lab/test results, encounter notes, upcoming appointments, etc.  Non-urgent messages can be sent to your provider as well.   To learn more about what you can do with MyChart, go to [email or web address].    Your next appointment:   2 month(s)  The format for your next appointment:   In Person  Provider:   Lorine Bears, MD    EXERCISE PROGRAM FOR INDIVIDUALS WITH  PERIPHERAL ARTERIAL DISEASE (PAD)   General Information:   Research in vascular exercise has demonstrated remarkable improvement in symptoms of leg pain (claudication) without expensive or invasive interventions. Regular walking programs are extremely helpful for patients with PAD and intermittent claudication.  These steps are designed to help you get started with  a safe and effective program to help you walk farther with less pain:   Walk at least three times a week (preferably every day).  Your goal is to build up to 30-45 minutes of total walking time (not counting rest breaks). It may take you several weeks to build up your exercise time starting at 5-10 minutes or whatever you can tolerate.  Walk as far as possible using moderate to maximal pain (7-8 on the scale below) as a signal to stop, and resume walking when the pain goes away.  On a treadmill, set the speed and grade at a level that brings on the claudication pain within 3 to 5 minutes. Walk at this rate until you experience claudication of moderate severity, rest until the pain improves, and then resume walking.  Over time, you will be able to walk longer at the designated speed and grade; workload should then be increased until you develop the pain within 3 to 5 minutes once again.  This regimen will induce a significant benefit. Studies have demonstrated that participants may be able to walk up to three or four times farther and have less leg pain, within twelve weeks, by following this protocol.  Pain Scale    0_____1_____2_____3_____4_____5_____6_____7_____8_____9_____10   No Pain                                   Moderate Pain  Maximal Pain

## 2021-09-27 ENCOUNTER — Encounter: Payer: Self-pay | Admitting: Family Medicine

## 2021-09-27 DIAGNOSIS — F17210 Nicotine dependence, cigarettes, uncomplicated: Secondary | ICD-10-CM | POA: Insufficient documentation

## 2021-10-14 ENCOUNTER — Telehealth: Payer: Self-pay

## 2021-10-14 ENCOUNTER — Encounter: Payer: Self-pay | Admitting: Family Medicine

## 2021-10-14 ENCOUNTER — Other Ambulatory Visit: Payer: Self-pay

## 2021-10-14 ENCOUNTER — Ambulatory Visit (INDEPENDENT_AMBULATORY_CARE_PROVIDER_SITE_OTHER): Payer: Medicare HMO | Admitting: Family Medicine

## 2021-10-14 VITALS — BP 164/78 | HR 64 | Ht 71.0 in | Wt 155.0 lb

## 2021-10-14 DIAGNOSIS — Z23 Encounter for immunization: Secondary | ICD-10-CM

## 2021-10-14 DIAGNOSIS — I152 Hypertension secondary to endocrine disorders: Secondary | ICD-10-CM

## 2021-10-14 DIAGNOSIS — E1151 Type 2 diabetes mellitus with diabetic peripheral angiopathy without gangrene: Secondary | ICD-10-CM | POA: Diagnosis not present

## 2021-10-14 DIAGNOSIS — E785 Hyperlipidemia, unspecified: Secondary | ICD-10-CM

## 2021-10-14 DIAGNOSIS — E1159 Type 2 diabetes mellitus with other circulatory complications: Secondary | ICD-10-CM | POA: Diagnosis not present

## 2021-10-14 DIAGNOSIS — I70219 Atherosclerosis of native arteries of extremities with intermittent claudication, unspecified extremity: Secondary | ICD-10-CM

## 2021-10-14 DIAGNOSIS — Z0000 Encounter for general adult medical examination without abnormal findings: Secondary | ICD-10-CM | POA: Diagnosis not present

## 2021-10-14 DIAGNOSIS — N1832 Chronic kidney disease, stage 3b: Secondary | ICD-10-CM | POA: Diagnosis not present

## 2021-10-14 DIAGNOSIS — M545 Low back pain, unspecified: Secondary | ICD-10-CM | POA: Diagnosis not present

## 2021-10-14 DIAGNOSIS — E11319 Type 2 diabetes mellitus with unspecified diabetic retinopathy without macular edema: Secondary | ICD-10-CM | POA: Diagnosis not present

## 2021-10-14 DIAGNOSIS — G8929 Other chronic pain: Secondary | ICD-10-CM

## 2021-10-14 DIAGNOSIS — E1169 Type 2 diabetes mellitus with other specified complication: Secondary | ICD-10-CM | POA: Diagnosis not present

## 2021-10-14 LAB — POCT GLYCOSYLATED HEMOGLOBIN (HGB A1C): HbA1c, POC (controlled diabetic range): 9.4 % — AB (ref 0.0–7.0)

## 2021-10-14 MED ORDER — EMPAGLIFLOZIN 10 MG PO TABS: 10.0000 mg | ORAL_TABLET | Freq: Every day | ORAL | 3 refills | Status: DC

## 2021-10-14 MED ORDER — CARVEDILOL 12.5 MG PO TABS: 12.5000 mg | ORAL_TABLET | Freq: Two times a day (BID) | ORAL | 3 refills | Status: DC

## 2021-10-14 NOTE — Progress Notes (Addendum)
30 minute visit CPT E&M Office Visit Time Before Visit; reviewing medical records (e.g. recent visits, labs, studies): 5 minutes During Visit (F2F time): 20 minutes After Visit (discussion with family or HCP, prescribing, ordering, referring, calling result/recommendations or documenting on same day): 10 minutes Total Visit Time: 35 minutes  [name] is alone Sources of clinical information for visit is/are patient and past medical records. Nursing assessment for this office visit was reviewed with the patient for accuracy and revision.     Previous Report(s) Reviewed: lab reports, office notes, and cardiology consultation notes and ABI report and cardiology labs  Depression screen [facility] 2/9 10/14/2021  Decreased Interest 0  Down, Depressed, Hopeless 0  PHQ - 2 Score 0  Altered sleeping 3  Tired, decreased energy 1  Change in appetite 1  Feeling bad or failure about yourself  1  Trouble concentrating 1  Moving slowly or fidgety/restless 1  Suicidal thoughts 0  PHQ-9 Score 8  Difficult doing work/chores Somewhat difficult  Some recent data might be hidden   Flowsheet Row Office Visit from 10/14/2021 in [facility] Clinical Support from 04/21/2021 in [facility] Office Visit from 03/18/2021 in [facility]  Thoughts that you would be better off dead, or of hurting yourself in some way Not at all Not at all Not at all  PHQ-9 Total Score 8 4 6        Fall Risk  10/14/2021 04/21/2021 01/14/2021 05/07/2020 03/26/2020  Falls in the past year? 1 0 0 0 0  Number falls in past yr: 0 - 0 - -  Injury with Fall? 0 - 0 - -  Follow up - Falls prevention discussed - - -    PHQ9 SCORE ONLY 10/14/2021 04/21/2021 03/18/2021  PHQ-9 Total Score 8 4 6     Adult vaccines due  Topic Date Due   TETANUS/TDAP  10/02/2024    Health Maintenance Due  Topic Date Due   COLONOSCOPY (Pts 45-58yrs Insurance coverage will need to be confirmed)   Never done   Zoster Vaccines- Shingrix (1 of 2) Never done   OPHTHALMOLOGY EXAM  06/21/2018   COLON CANCER SCREENING ANNUAL FOBT  01/22/2019   Pneumonia Vaccine 77+ Years old (2 - PCV) 05/07/2021    History/P.E. limitations: none  Adult vaccines due  Topic Date Due   TETANUS/TDAP  10/02/2024    Diabetes Health Maintenance Due  Topic Date Due   OPHTHALMOLOGY EXAM  06/21/2018   FOOT EXAM  01/14/2022   HEMOGLOBIN A1C  04/13/2022    Health Maintenance Due  Topic Date Due   COLONOSCOPY (Pts 45-66yrs Insurance coverage will need to be confirmed)  Never done   Zoster Vaccines- Shingrix (1 of 2) Never done   OPHTHALMOLOGY EXAM  06/21/2018   COLON CANCER SCREENING ANNUAL FOBT  01/22/2019   Pneumonia Vaccine 82+ Years old (2 - PCV) 05/07/2021     Chief Complaint  Patient presents with   Diabetes

## 2021-10-14 NOTE — Telephone Encounter (Signed)
Spoke with pt regarding re-enrollment with [employer]. Pt has an appt with Dr. Raymondo Band 10/21/21 and will sign app during his appt.

## 2021-10-14 NOTE — Patient Instructions (Signed)
Your A1c = 9.4 percent which is about the same as it was in April. Please see Dr Raymondo Band.  He may be aboe to help with better blood sugar control without lowering you sugar too much.  Please stop your metoprolol, and start carvedilol 12.5 mg tablet twice a day.  If you have problems tolerating this new medicine, please let Dr Paquita Printy  know.    Getting your eyes examines by an ophthalmologist for your diabetic retinopathy and cataracts is important.

## 2021-10-15 NOTE — Assessment & Plan Note (Signed)
Lab Results  Component Value Date   HGBA1C 9.4 (A) 10/14/2021  Same as it was in February.   Inadequate glycemic control in the setting of known microvascular (and macrovascular) end organ damages.   Review of CGM shows patient consistently staying in 300 to 350 range except for brief episodes in morning of readings below 60 (without significant symptoms).  He is taking basaglar 18 units qam and Novolog 4-6 unit twice a day with meals (Novolog started in 01/2021).  He has been hesitant to take morning Novolog for fear of hypoglycemia.    He is interested in starting an SGLT2i.  Previous attempt with [name] was well tolerated but insurance coverage was an issue.  I do not believe he has tried a GLP-1.   Given his improved, but still uncontrolled glycemia, Colin Keller agree to consult with Colin Raymondo Band with goal of getting his A1c below 8.0 at least.

## 2021-10-15 NOTE — Assessment & Plan Note (Signed)
Established problem Colin Keller has been putting off his ophth visit because of cost.  He believes he will sign up with a different [employer] patient C plan this fall so he will have better eye care coverage.    I emphasized the importance of his annual ophth evaluation given his history of diabetic retinopathy.  He said he understood.

## 2021-10-15 NOTE — Assessment & Plan Note (Signed)
Established problem Uncontrolled Colin Keller has been following the walking instructions of his cardiologist, Dr Gwenyth Bouillon.  He has to stop around 100 yards because of severe bilateral calf pain that resolves with rest. He reports bilateral lower leg pain laying in bed.  No lower extremity wounds.   Colin Keller is to see Dr Gwenyth Bouillon in December.  Colin Keller is considering asking him for an invasive procedure to treat the pain.

## 2021-10-15 NOTE — Assessment & Plan Note (Signed)
Established problem Well Controlled. No signs of complications, medication side effects, or red flags. Continue current medications and other regiments.  

## 2021-10-15 NOTE — Assessment & Plan Note (Signed)
Established problem Adequate analgesia. No adverse effects. Able to complete ADLs with use of HC/APAP 5/325  every 12 hr prn. No aberrant behaviors - [place] CSRS checked.   Continue current therapy regiment.

## 2021-10-15 NOTE — Assessment & Plan Note (Signed)
Declined CRC screening, Eye exam referral.   He needs his Prevnar20 at some point Reminder to get Shingrix

## 2021-10-18 ENCOUNTER — Other Ambulatory Visit: Payer: Self-pay | Admitting: Family Medicine

## 2021-10-18 DIAGNOSIS — G8929 Other chronic pain: Secondary | ICD-10-CM

## 2021-10-18 DIAGNOSIS — M545 Low back pain, unspecified: Secondary | ICD-10-CM

## 2021-10-21 ENCOUNTER — Encounter: Payer: Self-pay | Admitting: Pharmacist

## 2021-10-21 ENCOUNTER — Other Ambulatory Visit: Payer: Self-pay

## 2021-10-21 ENCOUNTER — Ambulatory Visit (INDEPENDENT_AMBULATORY_CARE_PROVIDER_SITE_OTHER): Payer: Medicare HMO | Admitting: Pharmacist

## 2021-10-21 DIAGNOSIS — F17210 Nicotine dependence, cigarettes, uncomplicated: Secondary | ICD-10-CM

## 2021-10-21 DIAGNOSIS — Z794 Long term (current) use of insulin: Secondary | ICD-10-CM

## 2021-10-21 DIAGNOSIS — E1165 Type 2 diabetes mellitus with hyperglycemia: Secondary | ICD-10-CM | POA: Diagnosis not present

## 2021-10-21 DIAGNOSIS — R69 Illness, unspecified: Secondary | ICD-10-CM | POA: Diagnosis not present

## 2021-10-21 MED ORDER — INSULIN LISPRO 100 UNIT/ML IJ SOLN: 3.0000 [IU] | Freq: Two times a day (BID) | INTRAMUSCULAR | Status: DC

## 2021-10-21 MED ORDER — BASAGLAR KWIKPEN 100 UNIT/ML ~~LOC~~ SOPN: 12.0000 [IU] | PEN_INJECTOR | Freq: Every day | SUBCUTANEOUS | Status: DC

## 2021-10-21 NOTE — Progress Notes (Signed)
S:    Chief Complaint  Patient presents with   Medication Management    Diabetes    Patient arrives in a pleasant mood, ambulating without assistance. Presents for diabetes evaluation, education, and management Patient was referred and last seen by Primary Care Provider Dr. McDiarmid on 10/14/2021.   Medication adherence reported good .   Current diabetes medications include: Basaglar (insulin glargine) 18 units in the morning, Humalog (insulin lispro) 3 units with breakfast and dinner. Although he was prescribed 4-6 units with meals,  he reports that 4 and 5 units of rapid insulin lowers his blood sugar too much.  Current hypertension medications include: hydralazine 50 mg BID, carvedilol 12.5 mg BID with meals  Current hyperlipidemia medications include: Praluent (alirocumab) 150 mg every 14 days, asprin 81 mg daily   Patient reports hypoglycemic events, as low as 50s. Typically occur in late-mornings/lunch time and correlates with use of rapid acting insulin.  Home glucose readings are difficult to interpret due to incorrect dates on meter. Meter was reset to better assess control at next visit.   Patient reports that he recently went to the pharmacy and was not able to pick up his Jardiance (empagliflozin) 10 mg prescription, due to it being over $100  Patient reported dietary habits: Eats ~2 meals/day Breakfast: light  Lunch: snacks  Dinner:largest meal of the day   Reports still occasionally smoking ~2 cigarettes/week, but is only buying individual cigarettes and gets intense cravings after meals, with coffee in the mornings, and times of stress.   Patient reported that his son passed away due to overdose earlier this year, which has caused him significant stress.   O:  Physical Exam Constitutional:      Appearance: Normal appearance. He is normal weight.  Neurological:     Mental Status: He is alert.    Review of Systems  All other systems reviewed and are  negative.  Lab Results  Component Value Date   HGBA1C 9.4 (A) 10/14/2021   Vitals:   10/21/21 1002  BP: (!) 160/82  Pulse: 65  SpO2: 98%    Lipid Panel     Component Value Date/Time   CHOL 125 08/03/2021 0850   TRIG 111 08/03/2021 0850   HDL 64 08/03/2021 0850   CHOLHDL 2.0 08/03/2021 0850   CHOLHDL 3.0 08/19/2018 0603   VLDL 14 08/19/2018 0603   LDLCALC 41 08/03/2021 0850   LDLDIRECT 105 09/19/2016 0922    Clinical Atherosclerotic Cardiovascular Disease (ASCVD): Yes    A/P: Diabetes longstanding currently poorly controlled. Patient is able to verbalize appropriate hypoglycemia management plan. Medication adherence appears good. Control is suboptimal due to misbalance of basal and bolus insulin. Due to frequent hypoglycemic events and inability to tolerate rapid insulin, there is a need to decrease long acting insulin and increase short acting insulin to improve control and prevent hypoglycemia.  -Decreased dose of basal insulin Basaglar (insulin glargine) from 18 to 12 units every morning  -Increased dose of  rapid insulin Humalog (insulin lispro) from 3 units with both larger meals to 3-4 units with breakfast and 4-5 units with dinner.  - Patient signed to initiate patient assistance paperwork for Humalog (insulin lispro), Basaglar (insulin glargine) and Jardiance (empagliflozin).  - Once approved/available to patient, will start Jardiance (empagliflozin) 10 mg daily  -Counseled on s/sx of and management of hypoglycemia -Next A1C anticipated 01/2022.  - Smoking has increased likely due to extreme stress due to death of son earlier this year. Comforted  patient and discussed the importance of not continuing to increase tobacco use any further.  - BP was not at goal during visit at 160/82. No changes were made at this visit, but will discuss potential of adding on an alpha blocker (doxazosin) or thiazide diuretic (HCTZ) at next visit in December   History of Chronic tobacco use  - quit post CABG in 2019. Relapsed to 8-10 cigarettes per day following overdose death of his only son. Discussed relapse details, current stress management and concern for increased tobacco use.  Patient agreed with plan to use ten or less cigarettes per day and work toward complete cessation again in the short-term future as he recovers from his grief.  Patient verbalized agreement with goals and plan.  Reassess at next visit.   Written patient instructions provided.  Total time in face to face counseling 40 minutes.   Follow up Pharmacist Clinic Visit on December 8th. Patient seen with Ruben Im, PharmD Candidate.

## 2021-10-21 NOTE — Patient Instructions (Addendum)
It was great seeing you today!   Decrease your long acting insulin Basaglar (insulin glargline) to 12 units in the morning   Increase your short acting insulin Humalog (insulin lispro) to 3 units before eating breakfast and 4-5 units before eating dinner   Follow up with me on December 8th

## 2021-10-21 NOTE — Assessment & Plan Note (Signed)
Diabetes longstanding currently poorly controlled. Patient is able to verbalize appropriate hypoglycemia management plan. Medication adherence appears good. Control is suboptimal due to misbalance of basal and bolus insulin. Due to frequent hypoglycemic events and inability to tolerate rapid insulin, there is a need to decrease long acting insulin and increase short acting insulin to improve control and prevent hypoglycemia.  -Decreased dose of basal insulin Basaglar (insulin glargine) from 18 to 12 units every morning  -Increased dose of  rapid insulin Humalog (insulin lispro) from 3 units with both larger meals to 3-4 units with breakfast and 4-5 units with dinner.  - Patient signed to initiate patient assistance paperwork for Humalog (insulin lispro), Basaglar (insulin glargine) and Jardiance (empagliflozin).  - Once approved/available to patient, will start Jardiance (empagliflozin) 10 mg daily  -Counseled on s/sx of and management of hypoglycemia -Next A1C anticipated 01/2022.  - Smoking has increased likely due to extreme stress due to death of son earlier this year. Comforted patient and discussed the importance of not continuing to increase tobacco use any further.  - BP was not at goal during visit at 160/82. No changes were made at this visit, but will discuss potential of adding on an alpha blocker (doxazosin) or thiazide diuretic (HCTZ) at next visit in December

## 2021-10-21 NOTE — Assessment & Plan Note (Signed)
History of Chronic tobacco use - quit post CABG in 2019. Relapsed to 8-10 cigarettes per day following overdose death of his only son Feb 19, 2021. Discussed relapse details, current stress management and concern for increased tobacco use.  Patient agreed with plan to use ten or less cigarettes per day and work toward complete cessation again in the short-term future as he recovers from his grief.  Patient verbalized agreement with goals and plan.  Reassess at next visit.

## 2021-10-22 ENCOUNTER — Telehealth: Payer: Self-pay

## 2021-10-22 NOTE — Telephone Encounter (Signed)
Was contacted by pharmacy team to reorder CGM supplies.   I submitted a reorder to [employer]. Patient contacted and advised to let me know if he does not hear anything from the company.

## 2021-10-25 NOTE — Progress Notes (Signed)
I reviewed and agree with Dr Macky Lower plan for Mr Prins.

## 2021-10-26 NOTE — Telephone Encounter (Signed)
Submitted RE ENROLLMENT application for [facility] & HUMALOG KWIKPEN to [employer] for patient assistance.   Phone: [phone]

## 2021-10-26 NOTE — Progress Notes (Signed)
Submitted application for JARDIANCE to [employer] ([employer]) for patient assistance.   Phone: [phone]  

## 2021-10-27 NOTE — Telephone Encounter (Signed)
Chart notes faxed to [employer].

## 2021-11-01 DIAGNOSIS — E1165 Type 2 diabetes mellitus with hyperglycemia: Secondary | ICD-10-CM | POA: Diagnosis not present

## 2021-11-15 ENCOUNTER — Other Ambulatory Visit: Payer: Self-pay | Admitting: Family Medicine

## 2021-11-15 DIAGNOSIS — G8929 Other chronic pain: Secondary | ICD-10-CM

## 2021-11-15 DIAGNOSIS — M545 Low back pain, unspecified: Secondary | ICD-10-CM

## 2021-11-16 NOTE — Progress Notes (Signed)
Received notification from [employer]) regarding patient assistance DENIAL for [employer].   PT MAY BE ELIGIBLE FOR MEDICATION THROUGH MEDICARE LOW INCOME SUBSIDY/EXTRA HELP. PT SHOULD CALL [phone] (SOCIAL SECURITY ADMIN)  TO DETERMINE THEIR ELIGIBILITY.  Phone:[phone]

## 2021-11-17 NOTE — Progress Notes (Signed)
Reviewed and appreciate Ms Hairston's efforts on patient's behalf.

## 2021-11-18 ENCOUNTER — Ambulatory Visit (INDEPENDENT_AMBULATORY_CARE_PROVIDER_SITE_OTHER): Payer: Medicare HMO | Admitting: Pharmacist

## 2021-11-18 ENCOUNTER — Other Ambulatory Visit: Payer: Self-pay

## 2021-11-18 ENCOUNTER — Telehealth: Payer: Self-pay | Admitting: Family Medicine

## 2021-11-18 ENCOUNTER — Ambulatory Visit (INDEPENDENT_AMBULATORY_CARE_PROVIDER_SITE_OTHER): Payer: Medicare HMO | Admitting: Family Medicine

## 2021-11-18 VITALS — BP 164/62 | HR 67 | Wt 153.6 lb

## 2021-11-18 VITALS — BP 164/62 | HR 67 | Wt 153.0 lb

## 2021-11-18 DIAGNOSIS — E1151 Type 2 diabetes mellitus with diabetic peripheral angiopathy without gangrene: Secondary | ICD-10-CM

## 2021-11-18 DIAGNOSIS — R6889 Other general symptoms and signs: Secondary | ICD-10-CM

## 2021-11-18 DIAGNOSIS — J029 Acute pharyngitis, unspecified: Secondary | ICD-10-CM | POA: Diagnosis not present

## 2021-11-18 LAB — POCT INFLUENZA A/B
Influenza A, POC: NEGATIVE
Influenza B, POC: NEGATIVE

## 2021-11-18 LAB — POCT RAPID STREP A (OFFICE): Rapid Strep A Screen: NEGATIVE

## 2021-11-18 NOTE — Telephone Encounter (Signed)
HIPAA compliant callback message left.  Please let him know when he calls back that his strep test is negative.   Flu test is pending.  Thanks.

## 2021-11-18 NOTE — Assessment & Plan Note (Signed)
Diabetes longstanding currently uncontrolled given reported home BG. Patient is able to verbalize appropriate hypoglycemia management plan. Medication adherence appears appropriate though he does self-titrate his insulins. Control is suboptimal due to requiring greater amount of insulin and current sickness. -Continued Basaglar (insulin glargine) 16 units -Increased Humalog (insulin lispro) to 5-7 units BIDAC while sick, then continue 3-5 units once recovered -Extensively discussed pathophysiology of diabetes, recommended lifestyle interventions, dietary effects on blood sugar control -Counseled on s/sx of and management of hypoglycemia

## 2021-11-18 NOTE — Progress Notes (Signed)
    SUBJECTIVE:   CHIEF COMPLAINT / HPI:   URI  This is a new problem. The current episode started in the past 7 days (Started 4 days ago, soon after he got exposed to his wife who had similar symptoms). The problem has been gradually improving. There has been no fever. Associated symptoms include sneezing, a sore throat and wheezing. Pertinent negatives include no congestion, coughing, ear pain, headaches, nausea or vomiting. Associated symptoms comments: He had throat pain which is now improving. He lost his voice, but now better. He sneezed for two days, but that has resolved.. Treatments tried: Tylenol. The treatment provided moderate relief.    PERTINENT  PMH / PSH: PMX reviewed  OBJECTIVE:   Vitals:   11/18/21 1108  BP: (!) 164/62  Pulse: 67  Weight: 153 lb (69.4 kg)     Physical Exam Vitals and nursing note reviewed.  HENT:     Mouth/Throat:     Pharynx: Oropharynx is clear. No oropharyngeal exudate.     Comments: Mild erythema of his oropharynx Eyes:     Extraocular Movements: Extraocular movements intact.     Conjunctiva/sclera: Conjunctivae normal.     Pupils: Pupils are equal, round, and reactive to light.  Cardiovascular:     Rate and Rhythm: Normal rate and regular rhythm.     Pulses: Normal pulses.     Heart sounds: Normal heart sounds. No murmur heard. Pulmonary:     Effort: Pulmonary effort is normal. No respiratory distress.     Breath sounds: Normal breath sounds. No stridor. No wheezing or rhonchi.  Musculoskeletal:     Cervical back: Neck supple. No tenderness.  Lymphadenopathy:     Cervical: No cervical adenopathy.  Neurological:     Mental Status: He is alert.     ASSESSMENT/PLAN:  Pharyngitis Censor score - zero Rapid strep pending COVID-19 test pending. Neg Influenza test. Continue Tylenol prn I will contact him with his result. I am reassured he is feeling better. May return soon or go to the ED if symptoms worsen. He was appreciative  of this assessment and agrees with the plan.     NB: Seen by Dr. Raymondo Band today for his BP and DM management. Further management per PCP.  Telephone call - I attempted to reach him after visit regarding neg strep. HIPPA compliant callback message left. Total time spent for this encounter was 25 minutes to include post visit phone call and lab testing.   Janit Pagan, MD [facility]

## 2021-11-18 NOTE — Patient Instructions (Signed)
Sore Throat When you have a sore throat, your throat may feel: Tender. Burning. Irritated. Scratchy. Painful when you swallow. Painful when you talk. Many things can cause a sore throat, such as: An infection. Allergies. Dry air. Smoke or pollution. Radiation treatment for cancer. Gastroesophageal reflux disease (GERD). A tumor. A sore throat can be the first sign of another sickness. It can happen with other problems, like: Coughing. Sneezing. Fever. Swelling of the glands in the neck. Most sore throats go away without treatment. Follow these instructions at home:   Medicines Take over-the-counter and prescription medicines only as told by your doctor. Children often get sore throats. Do not give your child aspirin. Use throat sprays to soothe your throat as told by your health care provider. Managing pain To help with pain: Sip warm liquids, such as broth, herbal tea, or warm water. Eat or drink cold or frozen liquids, such as frozen ice pops. Rinse your mouth (gargle) with a salt water mixture 3-4 times a day or as needed. To make salt water, dissolve -1 tsp (3-6 g) of salt in 1 cup (237 mL) of warm water. Do not swallow this mixture. Suck on hard candy or throat lozenges. Put a cool-mist humidifier in your bedroom at night. Sit in the bathroom with the door closed for 5-10 minutes while you run hot water in the shower. General instructions Do not smoke or use any products that contain nicotine or tobacco. If you need help quitting, ask your doctor. Get plenty of rest. Drink enough fluid to keep your pee (urine) pale yellow. Wash your hands often for at least 20 seconds with soap and water. If soap and water are not available, use hand sanitizer. Contact a doctor if: You have a fever for more than 2-3 days. You keep having symptoms for more than 2-3 days. Your throat does not get better in 7 days. You have a fever and your symptoms suddenly get worse. Your child  who is 3 months to 3 years old has a temperature of 102.2F (39C) or higher. Get help right away if: You have trouble breathing. You cannot swallow fluids, soft foods, or your spit. You have swelling in your throat or neck that gets worse. You feel like you may vomit (nauseous) and this feeling lasts a long time. You cannot stop vomiting. These symptoms may be an emergency. Get help right away. Call your local emergency services (911 in the [place] Do not wait to see if the symptoms will go away. Do not drive yourself to the hospital. Summary A sore throat is a painful, burning, irritated, or scratchy throat. Many things can cause a sore throat. Take over-the-counter medicines only as told by your doctor. Get plenty of rest. Drink enough fluid to keep your pee (urine) pale yellow. Contact a doctor if your symptoms get worse or your sore throat does not get better within 7 days. This information is not intended to replace advice given to you by your health care provider. Make sure you discuss any questions you have with your health care provider. Document Revised: 02/24/2021 Document Reviewed: 02/24/2021 Elsevier Patient Education  2022 [employer].  

## 2021-11-18 NOTE — Patient Instructions (Signed)
Continue Basaglar 16 units daily Increase Humalog while you are sick to 5-7 units with meals. Go back to 3-5 once you're feeling better and sugars are improving.   Jardiance patient assistance denied: must see if eligible for low income subsidy PT MAY BE ELIGIBLE FOR MEDICATION THROUGH MEDICARE LOW INCOME SUBSIDY/EXTRA HELP. PT SHOULD CALL [phone] (SOCIAL SECURITY ADMIN)  TO DETERMINE THEIR ELIGIBILITY. Phone:[phone]  Follow up visit in January with Dr. Raymondo Band.

## 2021-11-18 NOTE — Telephone Encounter (Signed)
Patient returns call to nurse line. Patient advised of negative strep test. Patient advised we will call him again once flu/covid tests are back.

## 2021-11-18 NOTE — Progress Notes (Signed)
S:     Chief Complaint  Patient presents with   Medication Management    Diabetes f/u   Patient arrives stating he does not feel well, thinks he may have the flu or a cold. Presents for diabetes evaluation, education, and management. Patient was referred and last seen by Primary Care Provider, Dr. McDiarmid, on 10/14/21. Last seen by pharmacy clinic 10/21/21.   At last visit, plan was to decrease Basaglar from 18 to 12 units daily due to hypoglycemia and increased Humalog to better cover meals. He is still taking 16-18 units of Basaglar and 3-5 units of Humalog with meals. He denies any hypoglycemia since last visit. Reports his BG have been higher since starting to be sick the last couple of days. He has not been taking Jardiance due to being in the donut hole. He is switching his Medicare part D plan to [facility] next year.  He has a good supply of both of his insulins.   He reports not taking carvedilol. He thinks someone told him to stop this but is not sure who.   Family/Social History:  -Tobacco: 2-3 cigarettes/day. Reports plan to quit prior to his appointment with Dr. Kirke Corin on 12/13.   Medication adherence reported.   Current diabetes medications include: Basaglar (insulin glargine) 16-18 units in the morning, Humalog (insulin lispro) 3-5 units with breakfast and dinner.  Current hypertension medications include: hydralazine 50 mg BID Current hyperlipidemia medications include: Praluent (alirocumab) 150 mg every 14 days, aspirin 81 mg daily  Patient denies hypoglycemic events.  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 1 per night.  Patient reports neuropathy (nerve  pain). Patient denies visual changes. Patient reports self foot exams.    O:  Physical Exam Vitals reviewed.  Constitutional:      Appearance: He is ill-appearing.  Cardiovascular:     Rate and Rhythm: Normal rate.  Pulmonary:     Effort: Pulmonary effort is normal.   Review of Systems  Constitutional:  Positive for chills and malaise/fatigue. Negative for fever.  HENT:  Positive for sore throat.    Lab Results  Component Value Date   HGBA1C 9.4 (A) 10/14/2021   There were no vitals filed for this visit.  Lipid Panel     Component Value Date/Time   CHOL 125 08/03/2021 0850   TRIG 111 08/03/2021 0850   HDL 64 08/03/2021 0850   CHOLHDL 2.0 08/03/2021 0850   CHOLHDL 3.0 08/19/2018 0603   VLDL 14 08/19/2018 0603   LDLCALC 41 08/03/2021 0850   LDLDIRECT 105 09/19/2016 0922   Unable to download [employer] data.   Clinical Atherosclerotic Cardiovascular Disease (ASCVD): Yes  The ASCVD Risk score (Arnett DK, et al., 2019) failed to calculate for the following reasons:   The valid total cholesterol range is 130 to 320 mg/dL   A/P: Diabetes longstanding currently uncontrolled given reported home BG. Patient is able to verbalize appropriate hypoglycemia management plan. Medication adherence appears appropriate though he does self-titrate his insulins. Control is suboptimal due to requiring greater amount of insulin and current sickness. -Continued Basaglar (insulin glargine) 16 units -Increased Humalog (insulin lispro) to 5-7 units BIDAC while sick, then continue 3-5 units once recovered -Extensively discussed pathophysiology of diabetes, recommended lifestyle interventions, dietary  effects on blood sugar control -Counseled on s/sx of and management of hypoglycemia -Next A1C anticipated February 2023.   Hypertension longstanding currently uncontrolled given BP reading today and at last few visits. Blood pressure goal = 130/80 mmHg. Medication adherence reported to  hydralazine. He has not been taking carvedilol. Given HR of 67 today while off of carvedilol, will discontinue this today.  -Continue hydralazine 50 mg BID -Consider addition of hydrochlorothiazide or doxazosin   Patient was seen by Dr. Lum Babe today for flu/cold-like symptoms. See her note for results and management.   Written patient instructions provided. Total time in face to face counseling 40 minutes.    Follow up Pharmacist Clinic Visit in 1 month. Patient seen with Pervis Hocking, PharmD - PGY2 Pharmacy Resident

## 2021-11-18 NOTE — Progress Notes (Signed)
Reviewed: I agree with the documentation and management of Dr. Koval. 

## 2021-11-19 LAB — NOVEL CORONAVIRUS, NAA: SARS-CoV-2, NAA: NOT DETECTED

## 2021-11-23 ENCOUNTER — Ambulatory Visit: Payer: Medicare HMO | Admitting: Cardiovascular Disease

## 2021-12-02 DIAGNOSIS — E1165 Type 2 diabetes mellitus with hyperglycemia: Secondary | ICD-10-CM | POA: Diagnosis not present

## 2021-12-16 ENCOUNTER — Other Ambulatory Visit: Payer: Self-pay | Admitting: Family Medicine

## 2021-12-16 ENCOUNTER — Ambulatory Visit: Payer: Medicare HMO | Admitting: Pharmacist

## 2021-12-16 DIAGNOSIS — G8929 Other chronic pain: Secondary | ICD-10-CM

## 2021-12-16 DIAGNOSIS — M545 Low back pain, unspecified: Secondary | ICD-10-CM

## 2021-12-17 ENCOUNTER — Other Ambulatory Visit: Payer: Self-pay

## 2021-12-17 DIAGNOSIS — G8929 Other chronic pain: Secondary | ICD-10-CM

## 2021-12-17 DIAGNOSIS — M545 Low back pain, unspecified: Secondary | ICD-10-CM

## 2021-12-17 MED ORDER — HYDROCODONE-ACETAMINOPHEN 10-325 MG PO TABS: 1.0000 | ORAL_TABLET | Freq: Two times a day (BID) | ORAL | 0 refills | Status: DC | PRN

## 2021-12-17 NOTE — Telephone Encounter (Signed)
Refill is due on 12/20/21. Will defer to PCP.

## 2021-12-17 NOTE — Telephone Encounter (Signed)
Patient's wife calls nurse line regarding request for hydrocodone-acetaminophen. Wife reports that drug store is closed on Sunday when medication is due.   Clarified with pharmacist that medication is due on Sunday, 1/8 and that today is the earliest day that patient can pick up rx.   Please advise if prescription can be sent with fill date of today or tomorrow, as pharmacy is closed on Sunday.   Veronda Prude, RN

## 2021-12-22 ENCOUNTER — Other Ambulatory Visit: Payer: Self-pay | Admitting: Cardiology

## 2022-01-05 DIAGNOSIS — R809 Proteinuria, unspecified: Secondary | ICD-10-CM | POA: Diagnosis not present

## 2022-01-05 DIAGNOSIS — E785 Hyperlipidemia, unspecified: Secondary | ICD-10-CM | POA: Diagnosis not present

## 2022-01-05 DIAGNOSIS — I70213 Atherosclerosis of native arteries of extremities with intermittent claudication, bilateral legs: Secondary | ICD-10-CM | POA: Diagnosis not present

## 2022-01-05 DIAGNOSIS — I2510 Atherosclerotic heart disease of native coronary artery without angina pectoris: Secondary | ICD-10-CM | POA: Diagnosis not present

## 2022-01-05 DIAGNOSIS — N1832 Chronic kidney disease, stage 3b: Secondary | ICD-10-CM | POA: Diagnosis not present

## 2022-01-05 DIAGNOSIS — I509 Heart failure, unspecified: Secondary | ICD-10-CM | POA: Diagnosis not present

## 2022-01-05 DIAGNOSIS — I129 Hypertensive chronic kidney disease with stage 1 through stage 4 chronic kidney disease, or unspecified chronic kidney disease: Secondary | ICD-10-CM | POA: Diagnosis not present

## 2022-01-05 DIAGNOSIS — Z951 Presence of aortocoronary bypass graft: Secondary | ICD-10-CM | POA: Diagnosis not present

## 2022-01-05 DIAGNOSIS — E1122 Type 2 diabetes mellitus with diabetic chronic kidney disease: Secondary | ICD-10-CM | POA: Diagnosis not present

## 2022-01-11 ENCOUNTER — Ambulatory Visit: Payer: Medicare HMO | Admitting: Cardiovascular Disease

## 2022-01-11 ENCOUNTER — Other Ambulatory Visit: Payer: Self-pay

## 2022-01-11 ENCOUNTER — Encounter: Payer: Self-pay | Admitting: Cardiovascular Disease

## 2022-01-11 VITALS — BP 132/68 | HR 62 | Ht 71.0 in | Wt 150.6 lb

## 2022-01-11 DIAGNOSIS — I6523 Occlusion and stenosis of bilateral carotid arteries: Secondary | ICD-10-CM

## 2022-01-11 DIAGNOSIS — I739 Peripheral vascular disease, unspecified: Secondary | ICD-10-CM | POA: Diagnosis not present

## 2022-01-11 DIAGNOSIS — I2510 Atherosclerotic heart disease of native coronary artery without angina pectoris: Secondary | ICD-10-CM

## 2022-01-11 DIAGNOSIS — E785 Hyperlipidemia, unspecified: Secondary | ICD-10-CM

## 2022-01-11 DIAGNOSIS — I10 Essential (primary) hypertension: Secondary | ICD-10-CM | POA: Diagnosis not present

## 2022-01-11 NOTE — Progress Notes (Signed)
Cardiology Office Note   Date:  01/11/2022   ID:  Colin Keller, DOB June 06, 1956, MRN [id number]  PCP:  McDiarmid, Leighton Roach, MD  Cardiologist: Dr. Jens Som  No chief complaint on file.      History of Present Illness: Colin Keller is a 66 y.o. male who is here today for follow-up visit regarding peripheral arterial disease.  He has known history of coronary artery disease with previous non-ST elevation myocardial infarction in 2019.  Cardiac catheterization showed severe three-vessel coronary artery disease and he was treated with CABG.  He has chronic medical conditions include hyperlipidemia with intolerance to statins, chronic kidney disease, type 2 diabetes and carotid artery disease. Previous screening for abdominal aortic aneurysm in 2020 was negative.  Moderately elevated velocities were noted in the iliac arteries. He was seen recently for bilateral calf claudication after walking about 100 yards. 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  By the end of the day, his legs feel very tired. Recent noninvasive vascular studies showed an ABI of 0.76 on the right and 0.63 on the left.  Duplex on the right showed severe stenosis in the distal SFA.  On the left, there was severe stenosis affecting the profunda as well as the distal SFA. I advised him to start a walking exercise program but he has not been able to do that as he stays busy with his work. He reports that his symptoms are still stable and overall manageable.  Past Medical History:  Diagnosis Date   Acute diastolic heart failure (HCC) 08/18/2018   Atherosclerosis of both carotid arteries 08/18/2020   Carotid dopplers 08/2019 40-59% bilateral stenosis and right subclavian stenosis   CHF (congestive heart failure) (HCC)    Chronic low back pain without sciatica 10/16/2008   H/o chronic LBP with h/o lumbar disc herniation and intermittent radicular pain  Chronic pain, on stable doses of Norco 10/325 TID. MRI in  2003 showing SMALL CENTRAL DISC HERNIATION AT L4-5.  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      CKD (chronic kidney disease), stage III (HCC)    Coronary artery disease    Diabetes mellitus    Dyspnea    GERD (gastroesophageal reflux disease)    Hx of CABG 08/23/2018   LIMA to LAD, RIMA to RCA, SVG to OM3, EVH via right thigh   Hyperlipidemia    Hypertension    Hypertension associated with diabetes (HCC) 12/22/2008   Qualifier: Diagnosis of  By: Lafonda Mosses MD, Susan     Hypertensive nephropathy 08/18/2020   Insulin dependent type 2 diabetes mellitus (HCC) 12/22/2020   Peripheral artery disease (HCC) 08/31/2021   [facility] Vascular Study lower extremities 08/30/21:            Today's ABI   Today's TBI    Previous ABI Previous TBI   +-------+-----------+-----------+------------+------------+   Right   0.76        0.31        1.03                        +-------+-----------+-----------+------------+------------+   Left    0.63        0.20        0.95                        +-------+-----------+---------   Proteinuria 08/18/2020   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   LIMA to LAD, RIMA to RCA, SVG to OM3, EVH via right thigh   Subclavian artery stenosis, right ([place]) 08/18/2020   Carotid dopplers 08/2019 40-59% bilateral stenosis and right subclavian stenosis   Tobacco abuse    Tobacco abuse disorder    Qualifier: Diagnosis of  By: Oneal Grout MD, Manuela Schwartz      Past Surgical History:  Procedure Laterality Date   CORONARY ARTERY BYPASS GRAFT N/A 08/23/2018   Procedure: CORONARY ARTERY BYPASS GRAFTING (CABG) x 3; -Left Internal Mammary Artery to Left Anterior Descending Artery, -Right Internal Mammary Artery to Right Coronary Artery, -Saphenous Vein Graft to Obtuse Marginal;  ENDOSCOPIC HARVEST GREATER SAPHENOUS VEIN  -Right Thigh;  Surgeon: Rexene Alberts, MD;  Location: [place];  Service: Open Heart Surgery;  Laterality: N/A;   INTRAVASCULAR PRESSURE WIRE/FFR STUDY N/A 08/20/2018    Procedure: INTRAVASCULAR PRESSURE WIRE/FFR STUDY;  Surgeon: Sherren Mocha, MD;  Location: [place] [facility];  Service: Cardiovascular;  Laterality: N/A;   LEFT HEART CATH AND CORONARY ANGIOGRAPHY N/A 08/20/2018   Procedure: LEFT HEART CATH AND CORONARY ANGIOGRAPHY;  Surgeon: Sherren Mocha, MD;  Location: [place] [facility];  Service: Cardiovascular;  Laterality: N/A;   TEE WITHOUT CARDIOVERSION N/A 08/23/2018   Procedure: TRANSESOPHAGEAL ECHOCARDIOGRAM (TEE);  Surgeon: Rexene Alberts, MD;  Location: [place];  Service: Open Heart Surgery;  Laterality: N/A;     Current Outpatient Medications  Medication Sig Dispense Refill   aspirin 81 MG tablet Take 81 mg by mouth daily.     Continuous Blood Gluc Sensor (FREESTYLE LIBRE 2 SENSOR) MISC As directed and replace sensor every 14 days. 2 each 11   Cyanocobalamin (VITAMIN B 12 PO) Take by mouth daily.     dapagliflozin propanediol (FARXIGA) 5 MG TABS tablet Take by mouth daily.     Famotidine 20 MG CHEW Chew 20 mg by mouth daily as needed (for reflux).     hydrALAZINE (APRESOLINE) 50 MG tablet Take 1 tablet (50 mg total) by mouth 2 (two) times daily. 180 tablet 3   HYDROcodone-acetaminophen (NORCO) 10-325 MG tablet Take 1 tablet by mouth 2 (two) times daily as needed for moderate pain. 60 tablet 0   Insulin Glargine (BASAGLAR KWIKPEN) 100 UNIT/ML Inject 12 Units into the skin daily before breakfast.     insulin lispro (HUMALOG) 100 UNIT/ML injection Inject 0.03-0.05 mLs (3-5 Units total) into the skin 2 (two) times daily with a meal. 3-4 units prior to breakfast, 4-5 units prior to evening meal     metFORMIN (GLUCOPHAGE) 500 MG tablet TAKE 2 TABLETS BY MOUTH TWICE DAILY WITHA MEAL 180 tablet 3   PRALUENT 150 MG/ML SOAJ INJECT 150 MG INTO THE SKIN EVERY 14 (FOURTEEN) DAYS. 6 mL 3   tamsulosin (FLOMAX) 0.4 MG CAPS capsule TAKE 1 CAPSULE BY MOUTH DAILY 90 capsule 3   No current facility-administered medications for this visit.    Allergies:    Amlodipine, Amitriptyline, Nortriptyline hcl, and Simvastatin    Social History:  The patient  reports that he quit smoking about 3 years ago. His smoking use included cigarettes. He started smoking about 51 years ago. He has a 48.00 pack-year smoking history. He has never used smokeless tobacco. He reports that he does not drink alcohol and does not use drugs.   Family History:  The patient's family history includes Diabetes in his mother; Drug abuse in his son; Heart disease in his father and mother; Heart failure in his mother; Sudden death  in his brother.    ROS:  Please see the history of present illness.   Otherwise, review of systems are positive for none.   All other systems are reviewed and negative.    PHYSICAL EXAM: VS:  BP 132/68    Pulse 62    Ht 5\' 11"  (1.803 m)    Wt 150 lb 9.6 oz (68.3 kg)    SpO2 97%    BMI 21.00 kg/m  , BMI Body mass index is 21 kg/m. GEN: Well nourished, well developed, in no acute distress  HEENT: normal  Neck: no JVD or masses.  Bilateral carotid bruits Cardiac: RRR; no murmurs, rubs, or gallops,no edema  Respiratory:  clear to auscultation bilaterally, normal work of breathing GI: soft, nontender, nondistended, + BS MS: no deformity or atrophy  Skin: warm and dry, no rash Neuro:  Strength and sensation are intact Psych: euthymic mood, full affect Vascular: Femoral pulses +2 bilaterally.  Distal pulses are not palpable   EKG:  EKG is not ordered today.    Recent Labs: 03/18/2021: Magnesium 1.9 08/03/2021: ALT 10; BUN 27; Creatinine, Ser 1.53; Potassium 4.9; Sodium 139    Lipid Panel    Component Value Date/Time   CHOL 125 08/03/2021 0850   TRIG 111 08/03/2021 0850   HDL 64 08/03/2021 0850   CHOLHDL 2.0 08/03/2021 0850   CHOLHDL 3.0 08/19/2018 0603   VLDL 14 08/19/2018 0603   LDLCALC 41 08/03/2021 0850   LDLDIRECT 105 09/19/2016 0922      Wt Readings from Last 3 Encounters:  01/11/22 150 lb 9.6 oz (68.3 kg)  11/18/21 153 lb (69.4  kg)  11/18/21 153 lb 9.6 oz (69.7 kg)      No flowsheet data found.    ASSESSMENT AND PLAN:  1.  Peripheral arterial disease: Moderate to severe bilateral calf claudication due to severe SFA disease.  He reports that his symptoms are stable overall.  He does not have as much exertional calf discomfort and seems to be more bothered by discomfort in his legs at night when he is trying to sleep.  He does have known history of insomnia and might have a component of restless leg syndrome.  His claudication does not seem to be lifestyle limiting at this point and thus I recommend continuing medical therapy.  I did discuss with him the option of adding cilostazol but he does not want to take an additional medication.   2.  Coronary artery disease involving native coronary arteries without angina: He seems to be doing well from a cardiac standpoint.  3.  Hyperlipidemia: He is intolerant to statin but doing well with Praluent with most recent LDL of 41.  4.  Essential hypertension: His blood pressure is controlled on current medications.  5.  Carotid artery disease: Annual Doppler is recommended.  6.  Stage III chronic kidney disease: Seems to be stable.  7.  Tobacco use: I again discussed with him the importance of smoking cessation but he reports inability to quit.    Disposition:   FU with me in 6 months  Signed,  Kathlyn Sacramento, MD  01/11/2022 11:50 AM    [place]

## 2022-01-11 NOTE — Patient Instructions (Signed)

## 2022-01-12 ENCOUNTER — Other Ambulatory Visit: Payer: Self-pay | Admitting: Family Medicine

## 2022-01-12 DIAGNOSIS — G8929 Other chronic pain: Secondary | ICD-10-CM

## 2022-01-12 DIAGNOSIS — M545 Low back pain, unspecified: Secondary | ICD-10-CM

## 2022-01-18 ENCOUNTER — Telehealth: Payer: Self-pay

## 2022-01-18 NOTE — Telephone Encounter (Signed)
Patient calls nurse line regarding request for CGM supplies. Patient states that insurance changed to [facility] and that they cover alternative CGM. Patient previously using [facility] for CGM supplies.   Will forward to pharmacy team on further guidance on ordering insurance covered CGM supplies.   Talbot Grumbling, RN

## 2022-01-19 ENCOUNTER — Other Ambulatory Visit: Payer: Self-pay | Admitting: Family Medicine

## 2022-01-19 DIAGNOSIS — E1165 Type 2 diabetes mellitus with hyperglycemia: Secondary | ICD-10-CM

## 2022-01-21 ENCOUNTER — Other Ambulatory Visit: Payer: Self-pay | Admitting: Family Medicine

## 2022-01-21 DIAGNOSIS — E1159 Type 2 diabetes mellitus with other circulatory complications: Secondary | ICD-10-CM

## 2022-01-21 MED ORDER — CARVEDILOL 12.5 MG PO TABS: 12.5000 mg | ORAL_TABLET | Freq: Two times a day (BID) | ORAL | 3 refills | Status: DC

## 2022-01-21 NOTE — Progress Notes (Signed)
HPI: Follow-up coronary artery disease.  Previously admitted to [facility] with non-ST elevation myocardial infarction.  Echocardiogram September 2019 showed ejection fraction 50 to [id number] and mild diastolic dysfunction.  Cardiac catheterization September 2019 showed severe coronary disease and he ultimately had coronary artery bypass and graft with a LIMA to the LAD, RIMA to the right coronary artery and saphenous vein graft to the obtuse marginal. He had problems with hyperkalemia postoperatively.  Also with chronic stage III kidney disease.  Echo 10/19 showed EF 50, mild MR and biatrial enlargement. Abdominal ultrasound December 2020 showed no aneurysm. There was greater than 50% stenosis on the right external iliac and left common iliac arteries.  Carotid Dopplers September 2022 showed 60 to 79% right and 60 to 79% left.  ABIs September 2022 moderate on the right and left.  Patient seen by Dr. Fletcher Anon and medical therapy recommended.  Since he was last seen patient denies dyspnea, chest pain, palpitations or syncope.  He continues to have claudication after approximately 200 yards right greater than left.  Current Outpatient Medications  Medication Sig Dispense Refill   aspirin 81 MG tablet Take 81 mg by mouth daily.     carvedilol (COREG) 12.5 MG tablet Take 1 tablet (12.5 mg total) by mouth 2 (two) times daily with a meal. 180 tablet 3   Continuous Blood Gluc Receiver (DEXCOM G6 RECEIVER) DEVI 1 Device by Does not apply route as directed. Use with Dexcom G6 sensor and transmitter 1 each 1   Continuous Blood Gluc Sensor (DEXCOM G6 SENSOR) MISC Inject 1 applicator into the skin as directed. Change sensor every 10 days. 3 each 11   Continuous Blood Gluc Transmit (DEXCOM G6 TRANSMITTER) MISC Inject 1 Device into the skin as directed. Reuse 8 times with sensor changes. 1 each 3   Cyanocobalamin (VITAMIN B 12 PO) Take by mouth daily.     dapagliflozin propanediol (FARXIGA) 5 MG TABS tablet  Take by mouth daily.     Famotidine 20 MG CHEW Chew 20 mg by mouth daily as needed (for reflux).     hydrALAZINE (APRESOLINE) 50 MG tablet Take 1 tablet (50 mg total) by mouth 2 (two) times daily. 180 tablet 3   HYDROcodone-acetaminophen (NORCO) 10-325 MG tablet TAKE 1 TABLET BY MOUTH 2 (TWO) TIMES DAILY AS NEEDED FOR MODERATE PAIN 60 tablet 0   Insulin Glargine (BASAGLAR KWIKPEN) 100 UNIT/ML Inject 12 Units into the skin daily before breakfast. (Patient taking differently: Inject 16 Units into the skin daily before breakfast. Does 18 units if needed if sugars are elevated)     insulin lispro (HUMALOG) 100 UNIT/ML injection Inject 0.03-0.05 mLs (3-5 Units total) into the skin 2 (two) times daily with a meal. 3-4 units prior to breakfast, 4-5 units prior to evening meal     metFORMIN (GLUCOPHAGE) 500 MG tablet TAKE 2 TABLETS BY MOUTH TWICE DAILY WITHA MEAL 180 tablet 3   PRALUENT 150 MG/ML SOAJ INJECT 150 MG INTO THE SKIN EVERY 14 (FOURTEEN) DAYS. 6 mL 3   tamsulosin (FLOMAX) 0.4 MG CAPS capsule TAKE 1 CAPSULE BY MOUTH DAILY 90 capsule 3   No current facility-administered medications for this visit.     Past Medical History:  Diagnosis Date   Acute diastolic heart failure ([place]) 08/18/2018   Atherosclerosis of both carotid arteries 08/18/2020   Carotid dopplers 08/2019 40-59% bilateral stenosis and right subclavian stenosis   CHF (congestive heart failure) (HCC)    Chronic low back pain  without sciatica 10/16/2008   H/o chronic LBP with h/o lumbar disc herniation and intermittent radicular pain  Chronic pain, on stable doses of Norco 10/325 TID. MRI in 2003 showing [place] AT L4-5.  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      CKD (chronic kidney disease), stage III (HCC)    Coronary artery disease    Diabetes mellitus    Dyspnea    GERD (gastroesophageal reflux disease)    Hx of CABG 08/23/2018   LIMA to LAD, RIMA to RCA, SVG to OM3, EVH via right  thigh   Hyperlipidemia    Hypertension    Hypertension associated with diabetes ([place]) 12/22/2008   Qualifier: Diagnosis of  By: Oneal Grout MD, Susan     Hypertensive nephropathy 08/18/2020   Insulin dependent type 2 diabetes mellitus ([place]) 12/22/2020   Peripheral artery disease ([place]) 08/31/2021   [facility] Vascular Study lower extremities 08/30/21:            Today's ABI   Today's TBI    Previous ABI Previous TBI   +-------+-----------+-----------+------------+------------+   Right   0.76        0.31        1.03                        +-------+-----------+-----------+------------+------------+   Left    0.63        0.20        0.95                        +-------+-----------+---------   Proteinuria 08/18/2020   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   LIMA to LAD, RIMA to RCA, SVG to OM3, EVH via right thigh   Subclavian artery stenosis, right ([place]) 08/18/2020   Carotid dopplers 08/2019 40-59% bilateral stenosis and right subclavian stenosis   Tobacco abuse    Tobacco abuse disorder    Qualifier: Diagnosis of  By: Oneal Grout MD, Manuela Schwartz      Past Surgical History:  Procedure Laterality Date   CORONARY ARTERY BYPASS GRAFT N/A 08/23/2018   Procedure: CORONARY ARTERY BYPASS GRAFTING (CABG) x 3; -Left Internal Mammary Artery to Left Anterior Descending Artery, -Right Internal Mammary Artery to Right Coronary Artery, -Saphenous Vein Graft to Obtuse Marginal;  ENDOSCOPIC HARVEST GREATER SAPHENOUS VEIN  -Right Thigh;  Surgeon: Rexene Alberts, MD;  Location: [place];  Service: Open Heart Surgery;  Laterality: N/A;   INTRAVASCULAR PRESSURE WIRE/FFR STUDY N/A 08/20/2018   Procedure: INTRAVASCULAR PRESSURE WIRE/FFR STUDY;  Surgeon: Sherren Mocha, MD;  Location: [place] [facility];  Service: Cardiovascular;  Laterality: N/A;   LEFT HEART CATH AND CORONARY ANGIOGRAPHY N/A 08/20/2018   Procedure: LEFT HEART CATH AND CORONARY ANGIOGRAPHY;  Surgeon: Sherren Mocha, MD;  Location: [place] [facility];  Service: Cardiovascular;   Laterality: N/A;   TEE WITHOUT CARDIOVERSION N/A 08/23/2018   Procedure: TRANSESOPHAGEAL ECHOCARDIOGRAM (TEE);  Surgeon: Rexene Alberts, MD;  Location: [place];  Service: Open Heart Surgery;  Laterality: N/A;    Social History   Socioeconomic History   Marital status: Married    Spouse name: Cecille Rubin    Number of children: 2   Years of education: 10   Highest education level: 10th grade  Occupational History   Occupation: Games developer  Tobacco Use   Smoking status: Former    Packs/day: 1.00    Years: 48.00    Pack  years: 48.00    Types: Cigarettes    Start date: 08/12/1970    Quit date: 08/12/2018    Years since quitting: 3.4   Smokeless tobacco: Never   Tobacco comments:    Quit post CABG in 2019 - previous 1ppd  Vaping Use   Vaping Use: Never used  Substance and Sexual Activity   Alcohol use: No    Alcohol/week: 0.0 standard drinks   Drug use: No   Sexual activity: Yes    Birth control/protection: Post-menopausal  Other Topics Concern   Not on file  Social History Narrative   Lives with his wife in [place].    Wife Cecille Rubin is also [facility] patient.   Works odd-jobs in Architect.    Previously used marijuana -quit 10/03. Previous 1ppd smoker- quit 2019.   Surrogate decision maker: Derrill Surti (wife)   Patient has one daughter.   Patients son died of a drug overdose 02/25/21.   Patient has 2 dogs and 2 cats.       Social Determinants of Health   Financial Resource Strain: Low Risk    Difficulty of Paying Living Expenses: Not hard at all  Food Insecurity: No Food Insecurity   Worried About Charity fundraiser in the Last Year: Never true   [place] in the Last Year: Never true  Transportation Needs: No Transportation Needs   Lack of Transportation (Medical): No   Lack of Transportation (Non-Medical): No  Physical Activity: Sufficiently Active   Days of Exercise per Week: 5 days   Minutes of Exercise per Session: 30 min  Stress: No Stress Concern Present   Feeling of  Stress : Not at all  Social Connections: Moderately Isolated   Frequency of Communication with Friends and Family: More than three times a week   Frequency of Social Gatherings with Friends and Family: More than three times a week   Attends Religious Services: Never   Marine scientist or Organizations: No   Attends Music therapist: Never   Marital Status: Married  Human resources officer Violence: Not At Risk   Fear of Current or Ex-Partner: No   Emotionally Abused: No   Physically Abused: No   Sexually Abused: No    Family History  Problem Relation Age of Onset   Heart disease Mother    Diabetes Mother    Heart failure Mother    Heart disease Father    Sudden death Brother    Drug abuse Son     ROS: no fevers or chills, productive cough, hemoptysis, dysphasia, odynophagia, melena, hematochezia, dysuria, hematuria, rash, seizure activity, orthopnea, PND, pedal edema, claudication. Remaining systems are negative.  Physical Exam: Well-developed well-nourished in no acute distress.  Skin is warm and dry.  HEENT is normal.  Neck is supple.  Chest is clear to auscultation with normal expansion.  Cardiovascular exam is regular rate and rhythm.  Abdominal exam nontender or distended. No masses palpated. Extremities show no edema. neuro grossly intact  A/P  1 coronary artery disease-patient doing well from a symptomatic standpoint.  Continue aspirin.  He is intolerant to statins.  2 peripheral vascular disease-seen by Dr. Fletcher Anon and medical therapy recommended.  3 hypertension-blood pressure elevated; however typically controlled.  I will ask him to track this and we will advance regimen if needed.  4 hyperlipidemia-intolerant to statins.  Continue Praluent.  5 carotid artery disease-plan follow-up carotid Doppler September 2023.  6 chronic combined systolic/diastolic congestive heart failure-euvolemic on examination  today.  7 chronic stage III kidney  disease-followed by nephrology.  8 history of statin myopathy.  Kirk Ruths, MD

## 2022-01-21 NOTE — Telephone Encounter (Signed)
Patient calls nurse line to request status of metformin refill.   Patient is also requesting carvedilol refill. Advised that at visit with Dr. Raymondo Band in December he reports that he was not taking it. Patient states that he has been taking it and it has been helping with his BP.   Medication is not on current medication list.   Please advise.   Veronda Prude, RN

## 2022-01-21 NOTE — Telephone Encounter (Signed)
No answer,  [place] HIPAA compliant message RE needs for glucose monitoring and plan to call again on Monday - provided my number if he can call me back later today.

## 2022-01-24 MED ORDER — DEXCOM G6 TRANSMITTER MISC: 1.0000 | 3 refills | Status: DC

## 2022-01-24 MED ORDER — DEXCOM G6 SENSOR MISC: 1.0000 | 11 refills | Status: DC

## 2022-01-24 MED ORDER — DEXCOM G6 RECEIVER DEVI: 1.0000 | 1 refills | Status: DC

## 2022-01-24 NOTE — Telephone Encounter (Signed)
Noted and agree. 

## 2022-01-24 NOTE — Telephone Encounter (Signed)
Contacted patient who was able to share that he was in need of a different type of CGM meter (currently has freestlye [place]) for insurance coverage.   I shared that we would begin process to proceed with [facility] G6 I asked him to pick up supplies of product prior to a visit with me next week. He was asked to schedule at his convenience for CGM restart.   New prescriptions for device, sensor and transmitter sent.   [name] has a diagnosis of diabetes, checks blood glucose readings > 4x per day, treats with 3 or 4 insulin injections daily, and requires frequent adjustments to insulin regimen. This patient will be seen every six months, minimally, to assess adherence to their CGM regimen and diabetes treatment plan.

## 2022-01-25 NOTE — Telephone Encounter (Signed)
After speaking with patients insurance and [facility] [employer], patient is able to continue [place].  [employer] reports his Josephine [place]go [place] had expired fat the end of 2022. Representative reported they already submitted a PA to patients insurance on 01/12/2022. Representative reports turn around time is 15-30 days. Representative reports they will reach out to patient with a determination.   I spoke with Raymondo Band who is willing to provide samples of Libre. I spoke with patient and updated him on progress and plan. Patient would like to come by and pick up sensors.   I have cancelled patients apt for dexcom education.

## 2022-01-26 NOTE — Telephone Encounter (Signed)
Patient contacted and informed of samples ready for pickup.   Patient instructed to ask for me once he arrives.  Samples are at my desk in RN room.

## 2022-01-27 NOTE — Telephone Encounter (Signed)
Patient picked up samples

## 2022-02-03 ENCOUNTER — Ambulatory Visit: Payer: Medicare HMO | Admitting: Pharmacist

## 2022-02-03 ENCOUNTER — Encounter: Payer: Self-pay | Admitting: Cardiology

## 2022-02-03 ENCOUNTER — Ambulatory Visit: Payer: Medicare HMO | Admitting: Cardiology

## 2022-02-03 ENCOUNTER — Other Ambulatory Visit: Payer: Self-pay

## 2022-02-03 VITALS — BP 148/66 | HR 65 | Ht 71.0 in | Wt 149.4 lb

## 2022-02-03 DIAGNOSIS — E785 Hyperlipidemia, unspecified: Secondary | ICD-10-CM | POA: Diagnosis not present

## 2022-02-03 DIAGNOSIS — I10 Essential (primary) hypertension: Secondary | ICD-10-CM

## 2022-02-03 DIAGNOSIS — I6523 Occlusion and stenosis of bilateral carotid arteries: Secondary | ICD-10-CM | POA: Diagnosis not present

## 2022-02-03 DIAGNOSIS — I2510 Atherosclerotic heart disease of native coronary artery without angina pectoris: Secondary | ICD-10-CM

## 2022-02-03 DIAGNOSIS — I739 Peripheral vascular disease, unspecified: Secondary | ICD-10-CM | POA: Diagnosis not present

## 2022-02-03 NOTE — Patient Instructions (Signed)

## 2022-02-10 ENCOUNTER — Other Ambulatory Visit: Payer: Self-pay | Admitting: Family Medicine

## 2022-02-10 DIAGNOSIS — G8929 Other chronic pain: Secondary | ICD-10-CM

## 2022-02-10 DIAGNOSIS — M545 Low back pain, unspecified: Secondary | ICD-10-CM

## 2022-02-11 ENCOUNTER — Telehealth: Payer: Self-pay | Admitting: Pharmacist

## 2022-02-11 NOTE — Telephone Encounter (Signed)
Patient called and shared that he has been unable to use the co-pay or free trial cards for [name] at his pharmacy due to him having government insurance.  ? ?He requested alternative assistance due to the high cost of this medication.  ? ?I shared with him that I would ask Jannette Spanner, CPhT to contact him and determine he may be able for medication assistance through the manufacturer.  ? ?He thanked me and asked that she call him at [phone] to discuss more.  ? ?[name], (dapagliflozin) 5mg  - one daily is the requested medication.  ?

## 2022-02-14 NOTE — Telephone Encounter (Signed)
Left message requesting call back regarding possible patient assistance. ? ?Call back #[phone] ?

## 2022-02-22 NOTE — Telephone Encounter (Signed)
Patient returns call to nurse line. Patient reports that he has not heard from insurance company regarding authorization.  ? ?Patient is requesting additional samples of sensors to last until determination on PA.  ? ?Please advise.  ? ?Talbot Grumbling, RN ? ?

## 2022-02-25 NOTE — Telephone Encounter (Signed)
Patient returns call to nurse line regarding request for sensor samples. Spoke with Dr. Valentina Lucks, advised that he placed two sensors on Page's desk. Informed patient and he will pick up this afternoon.  ? ?Colin Grumbling, RN ? ?

## 2022-02-28 ENCOUNTER — Encounter: Payer: Self-pay | Admitting: Family Medicine

## 2022-02-28 NOTE — Progress Notes (Signed)
CGM order To [employer] signed and Jerl Munyan office visit 10/14/21 note copy ?Fax'd. ?Libre 2 sensor monthly supply ?Freestyle precision neo strips for CGM ?Sensor monthly supply ? ?

## 2022-03-01 ENCOUNTER — Other Ambulatory Visit: Payer: Self-pay

## 2022-03-01 ENCOUNTER — Ambulatory Visit (INDEPENDENT_AMBULATORY_CARE_PROVIDER_SITE_OTHER): Payer: Medicare HMO

## 2022-03-01 DIAGNOSIS — I6523 Occlusion and stenosis of bilateral carotid arteries: Secondary | ICD-10-CM | POA: Diagnosis not present

## 2022-03-04 ENCOUNTER — Encounter: Payer: Self-pay | Admitting: *Deleted

## 2022-03-10 ENCOUNTER — Other Ambulatory Visit: Payer: Self-pay | Admitting: Family Medicine

## 2022-03-10 DIAGNOSIS — M545 Low back pain, unspecified: Secondary | ICD-10-CM

## 2022-03-10 DIAGNOSIS — G8929 Other chronic pain: Secondary | ICD-10-CM

## 2022-03-10 NOTE — Telephone Encounter (Signed)
Pt is aware that refill is due Sunday but the pharmacy is closed on Sunday, so they are requesting for Saturday pickup. Christen Bame, CMA ? ?

## 2022-03-28 ENCOUNTER — Telehealth: Payer: Self-pay

## 2022-03-28 NOTE — Telephone Encounter (Signed)
Patient calls nurse line reporting continued difficulty obtaining CGM supplies.  ? ?See previous notes for attempts to obtain via insurance and [employer].  ? ?Order form has been faxed to [employer] for [place] or Dexcom for hopeful approval. The original copy is at my desk.   ? ?Patient is requesting a sample sensor.  ? ?Will forward to pharmacy team.  ?

## 2022-03-30 NOTE — Telephone Encounter (Signed)
Spoke with [employer] checking on status of CGM order.  ? ?Representative reports his insurance is requiring a PA and they have started the process.  ? ?Rep reports she will reach out to me if anything is needed on our end.  ?

## 2022-03-30 NOTE — Telephone Encounter (Signed)
VM left informing patient of samples.  ?

## 2022-04-07 ENCOUNTER — Other Ambulatory Visit: Payer: Self-pay | Admitting: Family Medicine

## 2022-04-07 DIAGNOSIS — M545 Low back pain, unspecified: Secondary | ICD-10-CM

## 2022-04-07 DIAGNOSIS — G8929 Other chronic pain: Secondary | ICD-10-CM

## 2022-04-11 DIAGNOSIS — E1165 Type 2 diabetes mellitus with hyperglycemia: Secondary | ICD-10-CM | POA: Diagnosis not present

## 2022-05-04 ENCOUNTER — Other Ambulatory Visit: Payer: Self-pay

## 2022-05-04 ENCOUNTER — Telehealth: Payer: Self-pay

## 2022-05-04 DIAGNOSIS — G8929 Other chronic pain: Secondary | ICD-10-CM

## 2022-05-04 DIAGNOSIS — M545 Low back pain, unspecified: Secondary | ICD-10-CM

## 2022-05-04 NOTE — Telephone Encounter (Signed)
Patients wife calls nurse line requesting a refill on pain medication.   Wife reports the start date needs to be 6/27 (Saturday.)   Medication is due on 6/29 (Monday) however this is a holiday and the pharmacy will be closed. Pharmacy is closed on Sundays as well.   Will forward to PCP

## 2022-05-05 MED ORDER — HYDROCODONE-ACETAMINOPHEN 10-325 MG PO TABS: 1.0000 | ORAL_TABLET | Freq: Two times a day (BID) | ORAL | 0 refills | Status: DC | PRN

## 2022-05-05 NOTE — Telephone Encounter (Signed)
Pts wife calls again to check status.  Jone Baseman, CMA

## 2022-05-06 NOTE — Telephone Encounter (Signed)
Please let his wife know the patient's hydrocodone Rx was called into his pharmacy yesterday.

## 2022-05-06 NOTE — Telephone Encounter (Signed)
Attempted to reach pt's wife. No answer. LVM of note left by doctor. Aquilla Solian, CMA

## 2022-05-12 ENCOUNTER — Ambulatory Visit (INDEPENDENT_AMBULATORY_CARE_PROVIDER_SITE_OTHER): Payer: Medicare HMO | Admitting: Family Medicine

## 2022-05-12 ENCOUNTER — Encounter: Payer: Self-pay | Admitting: Family Medicine

## 2022-05-12 VITALS — BP 157/57 | HR 72 | Ht 71.0 in | Wt 146.1 lb

## 2022-05-12 DIAGNOSIS — R29898 Other symptoms and signs involving the musculoskeletal system: Secondary | ICD-10-CM

## 2022-05-12 DIAGNOSIS — Z23 Encounter for immunization: Secondary | ICD-10-CM

## 2022-05-12 DIAGNOSIS — I739 Peripheral vascular disease, unspecified: Secondary | ICD-10-CM | POA: Diagnosis not present

## 2022-05-12 DIAGNOSIS — Z1211 Encounter for screening for malignant neoplasm of colon: Secondary | ICD-10-CM | POA: Diagnosis not present

## 2022-05-12 DIAGNOSIS — I152 Hypertension secondary to endocrine disorders: Secondary | ICD-10-CM

## 2022-05-12 DIAGNOSIS — E1159 Type 2 diabetes mellitus with other circulatory complications: Secondary | ICD-10-CM | POA: Diagnosis not present

## 2022-05-12 DIAGNOSIS — E1165 Type 2 diabetes mellitus with hyperglycemia: Secondary | ICD-10-CM | POA: Diagnosis not present

## 2022-05-12 DIAGNOSIS — N1832 Chronic kidney disease, stage 3b: Secondary | ICD-10-CM

## 2022-05-12 DIAGNOSIS — M545 Low back pain, unspecified: Secondary | ICD-10-CM

## 2022-05-12 DIAGNOSIS — Z1212 Encounter for screening for malignant neoplasm of rectum: Secondary | ICD-10-CM

## 2022-05-12 DIAGNOSIS — E11319 Type 2 diabetes mellitus with unspecified diabetic retinopathy without macular edema: Secondary | ICD-10-CM

## 2022-05-12 DIAGNOSIS — G8929 Other chronic pain: Secondary | ICD-10-CM

## 2022-05-12 DIAGNOSIS — E1151 Type 2 diabetes mellitus with diabetic peripheral angiopathy without gangrene: Secondary | ICD-10-CM | POA: Diagnosis not present

## 2022-05-12 DIAGNOSIS — R202 Paresthesia of skin: Secondary | ICD-10-CM

## 2022-05-12 LAB — POCT GLYCOSYLATED HEMOGLOBIN (HGB A1C): HbA1c, POC (controlled diabetic range): 9.5 % — AB (ref 0.0–7.0)

## 2022-05-12 MED ORDER — EMPAGLIFLOZIN 25 MG PO TABS: 25.0000 mg | ORAL_TABLET | Freq: Every day | ORAL | 3 refills | Status: DC

## 2022-05-12 NOTE — Patient Instructions (Addendum)
    Please increase your hydralazine 50 mg tablet twice a day to three times a day for a week then go to hydralazine tablet four times a day.   Start Jardiance (empagliflozin) one tablet a day.    An order for nervce conduction studies has been submitted.     Due to healthcare laws, you may see the results of your imaging and laboratory studies on MyChart before your provider has had a chance to review them.   Your doctors understand that in some cases there may be results that are confusing or concerning to you.   Another cause of confusion is that all your laboratory results may not show up at the same time.   Your doctor may be waiting for multiple results to finally show up before your doctor can dicuss the results with you.     Please give [place] 72 hours in order for your doctor to thoroughly review all the results before contacting the office for clarification of your results.  If everything is normal, you will get a letter in the mail or a message in My Chart.  If there is a test or imaging study that is concerning to your doctor, they will call you to discuss them.   Please give [place] a call if you do not hear from [place] after 2 weeks.         SHINGLES VACCINATION  Chickenpox and shingles are related because they are caused by the same virus (varicella-zoster virus). After a person recovers from chickenpox, the virus stays dormant (inactive) in the body. It can reactivate years later and cause shingles.  CDC recommends that adults 50 years and older get two doses of the shingles vaccine called Shingrix (recombinant zoster vaccine) to prevent shingles and the complications from the disease.  Shingrix vaccination provides strong protection against shingles. In adults 50 years Shingrix is more than 90% effective at preventing shingles.  Studies show that Shingrix is safe. The vaccine helps your body create a strong defense against shingles. As a result, you are likely to have  temporary side effects from getting the shots. Some people feel they are having a mild flu without the head cold symptoms.  The side effects might affect your ability to do normal daily activities for 2 to 3 days.  Getting the shot when you have a couple days without commitments is a good idea, in case you do feel under the weather.  Medicare prescription drug plans (Part D) usually cover all available vaccines needed to prevent illness, like the shingles (Shingrix) shot. Contact your Medicare drug plan If you are uncertain if they will cover the shingles vaccination.   Your pharmacist can give you Shingrix as a shot in your upper arm. You will need two Shingrix vaccinations to complete your inoculation against shingles.  The second injection is given at least 2 months after the first Shingrix vaccination was given.

## 2022-05-13 ENCOUNTER — Encounter: Payer: Self-pay | Admitting: Family Medicine

## 2022-05-13 DIAGNOSIS — R202 Paresthesia of skin: Secondary | ICD-10-CM | POA: Insufficient documentation

## 2022-05-13 LAB — RENAL FUNCTION PANEL
Albumin: 4.2 g/dL (ref 3.8–4.8)
BUN/Creatinine Ratio: 20 (ref 10–24)
BUN: 30 mg/dL — ABNORMAL HIGH (ref 8–27)
CO2: 22 mmol/L (ref 20–29)
Calcium: 9.6 mg/dL (ref 8.6–10.2)
Chloride: 105 mmol/L (ref 96–106)
Creatinine, Ser: 1.48 mg/dL — ABNORMAL HIGH (ref 0.76–1.27)
Glucose: 382 mg/dL — ABNORMAL HIGH (ref 70–99)
Phosphorus: 3.6 mg/dL (ref 2.8–4.1)
Potassium: 5.4 mmol/L — ABNORMAL HIGH (ref 3.5–5.2)
Sodium: 141 mmol/L (ref 134–144)
eGFR: 52 mL/min/{1.73_m2} — ABNORMAL LOW (ref >59)

## 2022-05-13 LAB — MICROALBUMIN / CREATININE URINE RATIO
Creatinine, Urine: 45.5 mg/dL
Microalb/Creat Ratio: 2134 mg/g creat — ABNORMAL HIGH (ref 0–29)
Microalbumin, Urine: 970.9 ug/mL

## 2022-05-13 NOTE — Assessment & Plan Note (Signed)
Established problem Well Controlled and is at goal of urinary symptoms. No signs of complications, medication side effects, or red flags. Continue current medications and other regiments.

## 2022-05-13 NOTE — Progress Notes (Signed)
Colin Keller is alone Sources of clinical information for visit is/are patient and past medical records. Nursing assessment for this office visit was reviewed with the patient for accuracy and revision.     Previous Report(s) Reviewed: Cardiology consult last two consultation notes     05/12/2022   10:03 AM  Depression screen PHQ 2/9  Decreased Interest 0  Down, Depressed, Hopeless 1  PHQ - 2 Score 1  Altered sleeping 3  Tired, decreased energy 1  Change in appetite 1  Feeling bad or failure about yourself  1  Trouble concentrating 1  Moving slowly or fidgety/restless 1  Suicidal thoughts 0  PHQ-9 Score 9  Difficult doing work/chores Somewhat difficult   [place] Visit from 05/12/2022 in [place] Office Visit from 10/14/2021 in [place] from 04/21/2021 in [place]  Thoughts that you would be better off dead, or of hurting yourself in some way Not at all Not at all Not at all  PHQ-9 Total Score 9 8 4           05/12/2022   10:09 AM 10/14/2021    9:45 AM 04/21/2021   10:11 AM 01/14/2021    9:09 AM 05/07/2020    8:34 AM  [place] in the past year? 0 1 0 0 0  Number falls in past yr: 0 0  0   Injury with Fall? 0 0  0   Follow up   Falls prevention discussed         05/12/2022   10:03 AM 10/14/2021    9:45 AM 04/21/2021   10:05 AM  PHQ9 SCORE ONLY  PHQ-9 Total Score 9 8 4     Adult vaccines due  Topic Date Due   TETANUS/TDAP  10/02/2024    Health Maintenance Due  Topic Date Due   COLONOSCOPY (Pts 45-36yrs Insurance coverage will need to be confirmed)  Never done   Zoster Vaccines- Shingrix (1 of 2) Never done   OPHTHALMOLOGY EXAM  06/21/2018   COLON CANCER SCREENING ANNUAL FOBT  01/22/2019   FOOT EXAM  01/14/2022      History/P.E. limitations: none  Adult vaccines due  Topic Date Due   TETANUS/TDAP  10/02/2024    Diabetes Health Maintenance Due  Topic  Date Due   OPHTHALMOLOGY EXAM  06/21/2018   FOOT EXAM  01/14/2022   HEMOGLOBIN A1C  11/11/2022    Health Maintenance Due  Topic Date Due   COLONOSCOPY (Pts 45-66yrs Insurance coverage will need to be confirmed)  Never done   Zoster Vaccines- Shingrix (1 of 2) Never done   OPHTHALMOLOGY EXAM  06/21/2018   COLON CANCER SCREENING ANNUAL FOBT  01/22/2019   FOOT EXAM  01/14/2022     Chief Complaint  Patient presents with   Follow-up

## 2022-05-13 NOTE — Assessment & Plan Note (Addendum)
New complaint History elements: Onset / Duration: 6 months ago, acute onset.   Quality: Recurrent; loss of sensation in fore to mid foot on left,  Severity: Biomedical engineer  Course: unchanged since onset Modifying factors / Treatment prior to visit: nothing Context: uncontrolled diabetes, history of PAD Associated symptoms:  Unable to flex toes of left foot No pain/cramping in foot or legs with exertion  Exam Monofilament sensation Left 1/5  Right 5/5  Unable to flex left foot toes Able to flex right foot toes  A/ Numbness and weakness of right forefoot P/ EMG/NCV testing Referral back to Dr Kirke [facility]) to see if complaint related to patient's PAD  Work on improved glycemic control

## 2022-05-13 NOTE — Assessment & Plan Note (Signed)
Established problem Uncontrolled.  Patient is not at goal of A1c < 7.5%. Start: Empagliflozin 25 mg daily  Continue: Glargine 16-18 units, humalog 3-4 units am & 4-5 units pm, metfrmin 1000 BID

## 2022-05-13 NOTE — Assessment & Plan Note (Signed)
Established problem Controlled and allowing pt ability to complete ADLs and iADLs in efficient fashion.  No significant ADE identifies. No aberrant behaviors demonstrated Patient recently received RX for Norco 7.5 mg/325mg tablets, #60 tablets, RF:0 No concerns upon reviewing PDMP  Continue opioid analgesic current therapy regiment. 

## 2022-05-13 NOTE — Assessment & Plan Note (Signed)
Established problem Mr Tanney is concerned about his vision and possible cataracts He is willing to consult with ophthalmology  Referral was placed

## 2022-05-13 NOTE — Assessment & Plan Note (Signed)
Established problem Uncontrolled.  Patient is not at goal of < 130/90. Colin Keller is taking hydralazine (?HFrEF) twice a day - Recommend increase to three times a day for a week and then four times a day Continue carvedilol 12.5 mg twice a day Addition of empagliflozin may help with BP control RTC in 4 weeks May add ARB or increase carvedilol

## 2022-05-13 NOTE — Assessment & Plan Note (Addendum)
Basic Metabolic Panel:    Component Value Date/Time   NA 141 05/12/2022 1110   K 5.4 (H) 05/12/2022 1110   CL 105 05/12/2022 1110   CO2 22 05/12/2022 1110   BUN 30 (H) 05/12/2022 1110   CREATININE 1.48 (H) 05/12/2022 1110   CREATININE 1.57 (H) 07/14/2016 0847   GLUCOSE 382 (H) 05/12/2022 1110   GLUCOSE 240 (H) 10/08/2018 0515   CALCIUM 9.6 05/12/2022 1110   Established problem. Insurer did not cover Farxiga  Stable serum creatinine   Rx empagliflozin 25 mg daily start  Needs start of ARB next office visit for renoprotection  Surveillance iPTH next ov

## 2022-05-16 ENCOUNTER — Encounter: Payer: Self-pay | Admitting: Family Medicine

## 2022-05-17 ENCOUNTER — Encounter: Payer: Self-pay | Admitting: *Deleted

## 2022-05-17 ENCOUNTER — Telehealth: Payer: Self-pay | Admitting: Family Medicine

## 2022-05-17 DIAGNOSIS — E1151 Type 2 diabetes mellitus with diabetic peripheral angiopathy without gangrene: Secondary | ICD-10-CM

## 2022-05-17 DIAGNOSIS — N1832 Chronic kidney disease, stage 3b: Secondary | ICD-10-CM

## 2022-05-17 MED ORDER — EMPAGLIFLOZIN 25 MG PO TABS: 25.0000 mg | ORAL_TABLET | Freq: Every day | ORAL | 3 refills | Status: DC

## 2022-05-17 NOTE — Telephone Encounter (Signed)
I spoke with Colin Keller about the presence of albumin in his urine and this can indicate kidney injury, likely from his diabetes and hypertension.   We discussed checking a UPC ratio at next office visit 7/6.  If significantly elevated, I will likely recommend referral to nephrology.  Colin Keller reports he has not heard from his pharmacy about dispensing the prescribed empagliflozin from his last office visit.   I again sent in the prescription for empagliflozin 25 mg daily to [facility] today.

## 2022-05-18 ENCOUNTER — Telehealth: Payer: Self-pay | Admitting: Cardiovascular Disease

## 2022-05-18 NOTE — Telephone Encounter (Signed)
Pt states that he received a call but does not know who it was or what it was about. Please advise

## 2022-05-18 NOTE — Telephone Encounter (Signed)
Spoke to patient, unsure who called or what this was in regards to.  He states they left a voicemail but unsure who it was and he deleted the VM.   He believes they were trying to get him scheduled with Dr. Kirke Corin sooner.     Appt rescheduled for 6/13 with Dr. Kirke Corin.  Patient aware.

## 2022-05-24 ENCOUNTER — Ambulatory Visit: Payer: Medicare HMO | Admitting: Cardiovascular Disease

## 2022-05-24 ENCOUNTER — Encounter: Payer: Self-pay | Admitting: Cardiovascular Disease

## 2022-05-24 VITALS — BP 166/62 | HR 60 | Ht 71.0 in | Wt 142.4 lb

## 2022-05-24 DIAGNOSIS — I2510 Atherosclerotic heart disease of native coronary artery without angina pectoris: Secondary | ICD-10-CM | POA: Diagnosis not present

## 2022-05-24 DIAGNOSIS — I10 Essential (primary) hypertension: Secondary | ICD-10-CM | POA: Diagnosis not present

## 2022-05-24 DIAGNOSIS — I739 Peripheral vascular disease, unspecified: Secondary | ICD-10-CM | POA: Diagnosis not present

## 2022-05-24 DIAGNOSIS — I6523 Occlusion and stenosis of bilateral carotid arteries: Secondary | ICD-10-CM | POA: Diagnosis not present

## 2022-05-24 DIAGNOSIS — E785 Hyperlipidemia, unspecified: Secondary | ICD-10-CM | POA: Diagnosis not present

## 2022-05-24 MED ORDER — CILOSTAZOL 50 MG PO TABS: 50.0000 mg | ORAL_TABLET | Freq: Two times a day (BID) | ORAL | 5 refills | Status: DC

## 2022-05-24 NOTE — Progress Notes (Signed)
Cardiology Office Note   Date:  05/24/2022   ID:  Colin Keller, DOB Nov 22, 1956, MRN [id number]  PCP:  McDiarmid, Leighton Roach, MD  Cardiologist: Dr. Jens Som  No chief complaint on file.      History of Present Illness: Colin Keller is a 66 y.o. male who is here today for follow-up visit regarding peripheral arterial disease.  He has known history of coronary artery disease with previous non-ST elevation myocardial infarction in 2019.  Cardiac catheterization showed severe three-vessel coronary artery disease and he was treated with CABG.  He has chronic medical conditions include hyperlipidemia with intolerance to statins, chronic kidney disease, type 2 diabetes and carotid artery disease. Previous screening for abdominal aortic aneurysm in 2020 was negative.  Moderately elevated velocities were noted in the iliac arteries. He was seen last year for bilateral calf claudication.  Noninvasive vascular studies in September 2022 showed an ABI of 0.76 on the right and 0.63 on the left.  Duplex on the right showed severe stenosis in the distal SFA.  On the left, there was severe stenosis affecting the profunda as well as the distal SFA.  Most of his symptoms seem to be related to peripheral neuropathy with significant numbness in both feet especially at night.  In addition, he does have bilateral calf claudication that is worse on the left side.  He feels that his symptoms are stable.  Past Medical History:  Diagnosis Date   Acute diastolic heart failure (HCC) 08/18/2018   Atherosclerosis of both carotid arteries 08/18/2020   Carotid dopplers 08/2019 40-59% bilateral stenosis and right subclavian stenosis   CHF (congestive heart failure) (HCC)    Chronic low back pain without sciatica 10/16/2008   H/o chronic LBP with h/o lumbar disc herniation and intermittent radicular pain  Chronic pain, on stable doses of Norco 10/325 TID. MRI in 2003 showing SMALL CENTRAL DISC HERNIATION AT L4-5.  DIFFUSE DISC  BULGE AT L3-4 WITH SMALL ANNULAR TEAR POSTERIORLY.      CKD (chronic kidney disease), stage III (HCC)    Coronary artery disease    Diabetes mellitus    Dyspnea    GERD (gastroesophageal reflux disease)    Hx of CABG 08/23/2018   LIMA to LAD, RIMA to RCA, SVG to OM3, EVH via right thigh   Hyperlipidemia    Hypertension    Hypertension associated with diabetes (HCC) 12/22/2008   Qualifier: Diagnosis of  By: Lafonda Mosses MD, Susan     Hypertensive nephropathy 08/18/2020   Insulin dependent type 2 diabetes mellitus (HCC) 12/22/2020   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   LIMA to LAD, RIMA to RCA, SVG to OM3, EVH via right thigh   Subclavian artery stenosis, right (HCC) 08/18/2020   Carotid dopplers 08/2019 40-59%  bilateral stenosis and right subclavian stenosis   Tobacco abuse    Tobacco abuse disorder    Qualifier: Diagnosis of  By: Oneal Grout MD, Manuela Schwartz      Past Surgical History:  Procedure Laterality Date   CORONARY ARTERY BYPASS GRAFT N/A 08/23/2018   Procedure: CORONARY ARTERY BYPASS GRAFTING (CABG) x 3; -Left Internal Mammary Artery to Left Anterior Descending Artery, -Right Internal Mammary Artery to Right Coronary Artery, -Saphenous Vein Graft to Obtuse Marginal;  ENDOSCOPIC HARVEST GREATER SAPHENOUS VEIN  -Right Thigh;  Surgeon: Rexene Alberts, MD;  Location: [place];  Service: Open Heart Surgery;  Laterality: N/A;   INTRAVASCULAR PRESSURE WIRE/FFR STUDY N/A 08/20/2018   Procedure: INTRAVASCULAR PRESSURE WIRE/FFR STUDY;  Surgeon: Sherren Mocha, MD;  Location: [place] [facility];  Service: Cardiovascular;  Laterality: N/A;   LEFT HEART CATH AND CORONARY ANGIOGRAPHY N/A 08/20/2018   Procedure: LEFT HEART CATH AND CORONARY ANGIOGRAPHY;  Surgeon: Sherren Mocha, MD;  Location: [place] [facility];  Service: Cardiovascular;  Laterality: N/A;   TEE WITHOUT CARDIOVERSION N/A 08/23/2018   Procedure: TRANSESOPHAGEAL ECHOCARDIOGRAM (TEE);  Surgeon: Rexene Alberts, MD;  Location: [place];  Service: Open Heart Surgery;  Laterality: N/A;     Current Outpatient Medications  Medication Sig Dispense Refill   aspirin 81 MG tablet Take 81 mg by mouth daily.     carvedilol (COREG) 12.5 MG tablet Take 1 tablet (12.5 mg total) by mouth 2 (two) times daily with a meal. 180 tablet 3   cilostazol (PLETAL) 50 MG tablet Take 1 tablet (50 mg total) by mouth 2 (two) times daily. 60 tablet 5   Continuous Blood Gluc Receiver (DEXCOM G6 RECEIVER) DEVI 1 Device by Does not apply route as directed. Use with Dexcom G6 sensor and transmitter 1 each 1   Continuous Blood Gluc Sensor (DEXCOM G6 SENSOR) MISC Inject 1 applicator into the skin as directed. Change sensor every 10 days. 3 each 11   Continuous Blood Gluc Transmit (DEXCOM G6 TRANSMITTER) MISC Inject 1 Device into the skin as directed. Reuse 8 times with sensor changes. 1 each 3   Cyanocobalamin (VITAMIN B 12 PO) Take by mouth daily.     Famotidine 20 MG CHEW Chew 20 mg by mouth daily as needed (for reflux).     hydrALAZINE (APRESOLINE) 50 MG tablet Take 1 tablet (50 mg total) by mouth 2 (two) times daily. 180 tablet 3   HYDROcodone-acetaminophen (NORCO) 10-325 MG tablet Take 1 tablet by mouth 2 (two) times daily as needed for moderate pain. 60 tablet 0   Insulin Glargine (BASAGLAR KWIKPEN) 100 UNIT/ML Inject 12 Units into the skin daily before breakfast. (Patient taking differently: Inject 16 Units into the skin daily before breakfast. Does 18 units if needed if sugars are elevated)     insulin lispro  (HUMALOG) 100 UNIT/ML injection Inject 0.03-0.05 mLs (3-5 Units total) into the skin 2 (two) times daily with a meal. 3-4 units prior to breakfast, 4-5 units prior to evening meal     metFORMIN (GLUCOPHAGE) 500 MG tablet TAKE 2 TABLETS BY MOUTH TWICE DAILY WITHA MEAL 180 tablet 3   PRALUENT 150 MG/ML SOAJ INJECT 150 MG INTO THE SKIN EVERY 14 (FOURTEEN) DAYS. 6 mL 3   tamsulosin (FLOMAX) 0.4 MG CAPS capsule TAKE 1 CAPSULE BY MOUTH DAILY 90 capsule 3   No current facility-administered medications for this visit.    Allergies:   Amlodipine, Amitriptyline, Nortriptyline hcl, and Simvastatin    Social History:  The  patient  reports that he quit smoking about 3 years ago. His smoking use included cigarettes. He started smoking about 51 years ago. He has a 48.00 pack-year smoking history. He has never used smokeless tobacco. He reports that he does not drink alcohol and does not use drugs.   Family History:  The patient's family history includes Diabetes in his mother; Drug abuse in his son; Heart disease in his father and mother; Heart failure in his mother; Sudden death in his brother.    ROS:  Please see the history of present illness.   Otherwise, review of systems are positive for none.   All other systems are reviewed and negative.    PHYSICAL EXAM: VS:  BP (!) 166/62   Pulse 60   Ht 5\' 11"  (1.803 m)   Wt 142 lb 6.4 oz (64.6 kg)   SpO2 96%   BMI 19.86 kg/m  , BMI Body mass index is 19.86 kg/m. GEN: Well nourished, well developed, in no acute distress  HEENT: normal  Neck: no JVD or masses.  Bilateral carotid bruits Cardiac: RRR; no murmurs, rubs, or gallops,no edema  Respiratory:  clear to auscultation bilaterally, normal work of breathing GI: soft, nontender, nondistended, + BS MS: no deformity or atrophy  Skin: warm and dry, no rash Neuro:  Strength and sensation are intact Psych: euthymic mood, full affect Vascular: Femoral pulses +2 bilaterally.  Distal pulses are not  palpable   EKG:  EKG is  ordered today. EKG showed normal sinus rhythm with inferior T wave changes suggestive of ischemia.  No changes when compared to most recent EKG.   Recent Labs: 08/03/2021: ALT 10 05/12/2022: BUN 30; Creatinine, Ser 1.48; Potassium 5.4; Sodium 141    Lipid Panel    Component Value Date/Time   CHOL 125 08/03/2021 0850   TRIG 111 08/03/2021 0850   HDL 64 08/03/2021 0850   CHOLHDL 2.0 08/03/2021 0850   CHOLHDL 3.0 08/19/2018 0603   VLDL 14 08/19/2018 0603   LDLCALC 41 08/03/2021 0850   LDLDIRECT 105 09/19/2016 0922      Wt Readings from Last 3 Encounters:  05/24/22 142 lb 6.4 oz (64.6 kg)  05/12/22 146 lb 2 oz (66.3 kg)  02/03/22 149 lb 6.4 oz (67.8 kg)          No data to display            ASSESSMENT AND PLAN:  1.  Peripheral arterial disease: Moderate to severe bilateral calf claudication due to severe SFA disease.  His symptoms remain stable overall and he continues to have significant peripheral neuropathy.  He reports trying multiple medications for neuropathy in the past with no significant improvement.  For his claudication, I elected to add Pletal 50 mg twice daily. I discussed with him the option of adding small dose Xarelto 2.5 mg twice daily but he is concerned about the cost given that he goes into the donut hole quickly due to other medications.   2.  Coronary artery disease involving native coronary arteries without angina: He seems to be doing well from a cardiac standpoint.  3.  Hyperlipidemia: He is intolerant to statin but doing well with Praluent with most recent LDL of 41.  4.  Essential hypertension: His blood pressure is controlled on current medications.  5.  Carotid artery disease: Annual Doppler is recommended.  6.  Stage III chronic kidney disease: Seems to be stable.  7.  Tobacco use: I again discussed with him the importance  of smoking cessation but he reports inability to quit.    Disposition:   FU with me  in 6 months  Signed,  Kathlyn Sacramento, MD  05/24/2022 10:33 AM    [place]

## 2022-05-24 NOTE — Patient Instructions (Signed)
Medication Instructions:  START Cilostazol (Pletal) 50 mg twice daily  *If you need a refill on your cardiac medications before your next appointment, please call your pharmacy*   Lab Work: None ordered If you have labs (blood work) drawn today and your tests are completely normal, you will receive your results only by: MyChart Message (if you have MyChart) OR A paper copy in the mail If you have any lab test that is abnormal or we need to change your treatment, we will call you to review the results.   Testing/Procedures: None ordered   Follow-Up: At [facility], you and your health needs are our priority.  As part of our continuing mission to provide you with exceptional heart care, we have created designated Provider Care Teams.  These Care Teams include your primary Cardiologist (physician) and Advanced Practice Providers (APPs -  Physician Assistants and Nurse Practitioners) who all work together to provide you with the care you need, when you need it.  We recommend signing up for the patient portal called "MyChart".  Sign up information is provided on this After Visit Summary.  MyChart is used to connect with patients for Virtual Visits (Telemedicine).  Patients are able to view lab/test results, encounter notes, upcoming appointments, etc.  Non-urgent messages can be sent to your provider as well.   To learn more about what you can do with MyChart, go to [email or web address].    Your next appointment:   6 month(s)  The format for your next appointment:   In Person  Provider:   Dr. Kirke Corin  Important Information About Sugar

## 2022-06-03 ENCOUNTER — Other Ambulatory Visit: Payer: Self-pay | Admitting: Family Medicine

## 2022-06-03 DIAGNOSIS — M545 Low back pain, unspecified: Secondary | ICD-10-CM

## 2022-06-03 DIAGNOSIS — G8929 Other chronic pain: Secondary | ICD-10-CM

## 2022-06-11 DIAGNOSIS — E1165 Type 2 diabetes mellitus with hyperglycemia: Secondary | ICD-10-CM | POA: Diagnosis not present

## 2022-06-14 ENCOUNTER — Telehealth: Payer: Self-pay | Admitting: Student

## 2022-06-14 NOTE — Telephone Encounter (Signed)
After-hours call/emergency line  Patient and his wife called regarding concern for hypoglycemia. He said his glucose was 240 this morning and so he took 15 units Basaglar and short-acting insulin (unsure of how much) and then this afternoon at 1:00 pm, his glucose dropped to 51 when he went to [employer]. He said he felt lightheaded with blurry vision. He then ate a donut without much improvement in CBG at 54-61. 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 While on the phone, his glucose improved to low 70s and he said he was feeling much better. He was speaking in clear sentences with fluent speech. No headache, chest pain, abdominal pain or slurred speech/difficulty word finding. I educated patient to avoid the heat, dehydration and overexertion to prevent drops in his glucose for the rest of the day. Discussed warning signs of hyperglycemia/to have something sugary if his glucose <70. Patient has an appointment set up on 06/16/22 with Dr. McDiarmid. Discussed ED return precautions with patient and his wife.

## 2022-06-16 ENCOUNTER — Ambulatory Visit (INDEPENDENT_AMBULATORY_CARE_PROVIDER_SITE_OTHER): Payer: Medicare HMO | Admitting: Family Medicine

## 2022-06-16 ENCOUNTER — Encounter: Payer: Self-pay | Admitting: Family Medicine

## 2022-06-16 VITALS — BP 152/68 | HR 74 | Ht 71.0 in | Wt 144.0 lb

## 2022-06-16 DIAGNOSIS — I739 Peripheral vascular disease, unspecified: Secondary | ICD-10-CM | POA: Diagnosis not present

## 2022-06-16 DIAGNOSIS — I6523 Occlusion and stenosis of bilateral carotid arteries: Secondary | ICD-10-CM

## 2022-06-16 DIAGNOSIS — R809 Proteinuria, unspecified: Secondary | ICD-10-CM | POA: Diagnosis not present

## 2022-06-16 DIAGNOSIS — N1831 Chronic kidney disease, stage 3a: Secondary | ICD-10-CM

## 2022-06-16 DIAGNOSIS — I728 Aneurysm of other specified arteries: Secondary | ICD-10-CM

## 2022-06-16 DIAGNOSIS — F17210 Nicotine dependence, cigarettes, uncomplicated: Secondary | ICD-10-CM

## 2022-06-16 DIAGNOSIS — E11319 Type 2 diabetes mellitus with unspecified diabetic retinopathy without macular edema: Secondary | ICD-10-CM

## 2022-06-16 DIAGNOSIS — R202 Paresthesia of skin: Secondary | ICD-10-CM | POA: Diagnosis not present

## 2022-06-16 DIAGNOSIS — Z794 Long term (current) use of insulin: Secondary | ICD-10-CM

## 2022-06-16 DIAGNOSIS — E1151 Type 2 diabetes mellitus with diabetic peripheral angiopathy without gangrene: Secondary | ICD-10-CM | POA: Diagnosis not present

## 2022-06-16 DIAGNOSIS — I5022 Chronic systolic (congestive) heart failure: Secondary | ICD-10-CM

## 2022-06-16 DIAGNOSIS — E1159 Type 2 diabetes mellitus with other circulatory complications: Secondary | ICD-10-CM

## 2022-06-16 DIAGNOSIS — I152 Hypertension secondary to endocrine disorders: Secondary | ICD-10-CM

## 2022-06-16 NOTE — Patient Instructions (Addendum)
Referrals to [facility] for Diabetic Eye Exam.   Referral to [facility] Endocrinology for managing your diabetes  Will contact Dr Raymondo Band about getting you an SGLT2 inhibitor medication for your diabetes and kidney protection.   A referral to [facility] for leg nerve conduction testing.  Location at [address] [place], [place] [address] . Phone: [phone] Please let Dr Geri Hepler know if you have not hear about the nerve conduction testing within 5 business days.   Make sure to check out at the front desk before you leave.  If you had blood tests today, Dr Tinesha Siegrist will send you a MyChart message or a letter if the results are normal.  Otherwise, he will give you a call.   If Dr Laetitia Schnepf ordered you to have a referral, you should hear from the referral's office to schedule an appointment.    If you have not heard from the referral's office within 5 business-days, please let Dr Devyn Griffing know by sending him a message through MyChart, or calling the [facility] Office [phone]) to leave him a message.

## 2022-06-17 ENCOUNTER — Encounter: Payer: Self-pay | Admitting: Family Medicine

## 2022-06-17 LAB — PROTEIN / CREATININE RATIO, URINE
Creatinine, Urine: 67.3 mg/dL
Protein, Ur: 123.2 mg/dL
Protein/Creat Ratio: 1831 mg/g creat — ABNORMAL HIGH (ref 0–200)

## 2022-06-17 NOTE — Progress Notes (Signed)
[name] is alone Sources of clinical information for visit is/are patient, past medical records. Nursing assessment for this office visit was reviewed with the patient for accuracy and revision.     Previous Report(s) Reviewed: requested office visit notes from pt's nephrology, reviewed tc from patient's wife, review last office visit note with cardiology    06/16/2022   10:07 AM  Depression screen PHQ 2/9  Decreased Interest 0  Down, Depressed, Hopeless 1  PHQ - 2 Score 1  Altered sleeping 3  Tired, decreased energy 3  Change in appetite 1  Feeling bad or failure about yourself  2  Trouble concentrating 1  Moving slowly or fidgety/restless 1  Suicidal thoughts 0  PHQ-9 Score 12  Difficult doing work/chores Somewhat difficult   Flowsheet Row Office Visit from 06/16/2022 in [facility] Office Visit from 05/12/2022 in [facility] Office Visit from 10/14/2021 in [facility]  Thoughts that you would be better off dead, or of hurting yourself in some way Not at all Not at all Not at all  PHQ-9 Total Score 12 9 8           06/16/2022   10:08 AM 05/12/2022   10:09 AM 10/14/2021    9:45 AM 04/21/2021   10:11 AM 01/14/2021    9:09 AM  Fall Risk   Falls in the past year? 1 0 1 0 0  Number falls in past yr: 0 0 0  0  Injury with Fall?  0 0  0  Follow up    Falls prevention discussed        06/16/2022   10:07 AM 05/12/2022   10:03 AM 10/14/2021    9:45 AM  PHQ9 SCORE ONLY  PHQ-9 Total Score 12 9 8     Adult vaccines due  Topic Date Due   TETANUS/TDAP  10/02/2024    Health Maintenance Due  Topic Date Due   COLONOSCOPY (Pts 45-29yrs Insurance coverage will need to be confirmed)  Never done   Zoster Vaccines- Shingrix (1 of 2) Never done   OPHTHALMOLOGY EXAM  06/21/2018   COLON CANCER SCREENING ANNUAL FOBT  01/22/2019   FOOT EXAM  01/14/2022      History/P.E. limitations: none  Adult vaccines due  Topic Date Due    TETANUS/TDAP  10/02/2024    Diabetes Health Maintenance Due  Topic Date Due   OPHTHALMOLOGY EXAM  06/21/2018   FOOT EXAM  01/14/2022   HEMOGLOBIN A1C  11/11/2022    Health Maintenance Due  Topic Date Due   COLONOSCOPY (Pts 45-51yrs Insurance coverage will need to be confirmed)  Never done   Zoster Vaccines- Shingrix (1 of 2) Never done   OPHTHALMOLOGY EXAM  06/21/2018   COLON CANCER SCREENING ANNUAL FOBT  01/22/2019   FOOT EXAM  01/14/2022     Chief Complaint  Patient presents with   Diabetes   Hypertension

## 2022-06-17 NOTE — Assessment & Plan Note (Signed)
Established problem. Stable. Patient is at goal of no ischemic symptoms. No signs of complications, medication side effects, or red flags. Continue current medications and other regiments.

## 2022-06-17 NOTE — Assessment & Plan Note (Signed)
Established problem Reinforced Cardiology discussion with patient about role of smoking in progression of PAD.  patient appears to have an understanding.  He seems in a precontemplative phse with no interest in cessation at this time.

## 2022-06-17 NOTE — Assessment & Plan Note (Signed)
Established problem Checking UPC given  Mr Colin Keller is already connected with outpt nephrology ([facility]) He missed his last app't with them.  He said he would reschedule a visit with them.

## 2022-06-17 NOTE — Assessment & Plan Note (Signed)
Established problem Referral to [facility] for overdue follow up exam of his established diabetic retinopathy dx

## 2022-06-17 NOTE — Assessment & Plan Note (Addendum)
Established problem Pletal started by Dr Tomasa Rand (Card) has helped with pain at bedtime But weakness in left toes persist  patient plans on continuing Pletal and ASA Discussed role of smoking in in progression of calf claudication with exertion

## 2022-06-17 NOTE — Assessment & Plan Note (Signed)
Established problem Review of CMG shows glucose measures almost always greater than 250.   There was an episode of hypoglycemia down into the 50s aroung 1 PM on 06/14/22. He was symptomatic with strange feeling and blurring vision.   He had eaten his usual bfst amount at usual time around 8 am.  His CGM reading was ~ 120.  He took his usual dose 14 units of Basaglar aroudn 8 .  He did not take his usual morning Humalog.  He was able to self-rescue. Since then his glucose readings have been the usual greater than 250.   Uncontrolled.  Patient is not at goal of capillary blood glucose's less than 180 most of the time.  Will attempt again to prescribe an SGLT2i, this time with assistance from pharmacy to find one with adequate cost coverage by insurer.   We have not had success in quite some time in getting Colin Keller to glycemic control goals.  He is having microvascular and macrovascular complications from his diabetes.  He is amenable to consulting with Endocrinology with goal of achieving improved control.  Plan to continue current insulin regiment and try to get SGLT2i started, finances allowing.

## 2022-06-17 NOTE — Assessment & Plan Note (Addendum)
Established problem 03/01/22 Carotid Artery Dopplers: Right subclavian artery flow was disturbed. No right (dominant) arm claudication sxs Continue current CV RF reduction medication

## 2022-06-17 NOTE — Assessment & Plan Note (Signed)
Established problem Uncontrolled.  Patient is not at goal of weakness in left forefoot. Referral for [facility]-NCS [facility]

## 2022-06-17 NOTE — Assessment & Plan Note (Signed)
Established problem Well Controlled and is at goal of asymptomatic condition. No signs of complications, medication side effects, or red flags. Continue current medications and other regiments. Would like to know if ACEI/ARB alright to start for BP control. Will ask nephrology.

## 2022-06-17 NOTE — Assessment & Plan Note (Signed)
Established problem that has improved and has not meet goal of <130/80 (Renal disease).  Continue current medication regiment. I have been unable to find an SGLT2i that was affordable for Mr Sheeler.  Will ask [facility] to help identify an affordable one.   I have sent a question to OP Nephrology about use of ARB/ACEI in Mr Basic's case.

## 2022-06-22 ENCOUNTER — Telehealth: Payer: Self-pay

## 2022-06-22 ENCOUNTER — Encounter: Payer: Self-pay | Admitting: Family Medicine

## 2022-06-22 DIAGNOSIS — R809 Proteinuria, unspecified: Secondary | ICD-10-CM | POA: Insufficient documentation

## 2022-06-22 NOTE — Telephone Encounter (Signed)
Left voicemail requesting call back regarding patient assistance options.

## 2022-07-04 ENCOUNTER — Other Ambulatory Visit: Payer: Self-pay | Admitting: Family Medicine

## 2022-07-04 DIAGNOSIS — G8929 Other chronic pain: Secondary | ICD-10-CM

## 2022-07-04 DIAGNOSIS — M545 Low back pain, unspecified: Secondary | ICD-10-CM

## 2022-07-11 ENCOUNTER — Other Ambulatory Visit: Payer: Self-pay | Admitting: Family Medicine

## 2022-07-11 DIAGNOSIS — E1165 Type 2 diabetes mellitus with hyperglycemia: Secondary | ICD-10-CM

## 2022-07-12 ENCOUNTER — Ambulatory Visit: Payer: Medicare HMO | Admitting: Cardiovascular Disease

## 2022-07-12 DIAGNOSIS — E1165 Type 2 diabetes mellitus with hyperglycemia: Secondary | ICD-10-CM | POA: Diagnosis not present

## 2022-07-28 DIAGNOSIS — G629 Polyneuropathy, unspecified: Secondary | ICD-10-CM | POA: Insufficient documentation

## 2022-08-01 ENCOUNTER — Other Ambulatory Visit: Payer: Self-pay | Admitting: Family Medicine

## 2022-08-01 DIAGNOSIS — G8929 Other chronic pain: Secondary | ICD-10-CM

## 2022-08-01 DIAGNOSIS — M545 Low back pain, unspecified: Secondary | ICD-10-CM

## 2022-08-04 ENCOUNTER — Other Ambulatory Visit: Payer: Self-pay

## 2022-08-04 ENCOUNTER — Telehealth: Payer: Self-pay | Admitting: Family Medicine

## 2022-08-04 DIAGNOSIS — E1149 Type 2 diabetes mellitus with other diabetic neurological complication: Secondary | ICD-10-CM

## 2022-08-04 DIAGNOSIS — E1151 Type 2 diabetes mellitus with diabetic peripheral angiopathy without gangrene: Secondary | ICD-10-CM

## 2022-08-04 DIAGNOSIS — G629 Polyneuropathy, unspecified: Secondary | ICD-10-CM

## 2022-08-04 DIAGNOSIS — M545 Low back pain, unspecified: Secondary | ICD-10-CM

## 2022-08-04 DIAGNOSIS — G8929 Other chronic pain: Secondary | ICD-10-CM

## 2022-08-04 MED ORDER — HYDROCODONE-ACETAMINOPHEN 10-325 MG PO TABS: 1.0000 | ORAL_TABLET | Freq: Two times a day (BID) | ORAL | 0 refills | Status: DC | PRN

## 2022-08-04 NOTE — Telephone Encounter (Signed)
Received phone call from patient's wife regarding pain medicine.   The medication is out of stock at [facility]. Called and canceled rx at [facility]. They are requesting that medication be transferred to [facility] on [address].   Medication pended to this encounter.   Veronda Prude, RN

## 2022-08-04 NOTE — Telephone Encounter (Signed)
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   While the working explanation of these results is uncontrolled diabetes, I am concerned by the patient's the left foot drop with NCS showing absent left tibial motor response and EMG with abnormal motor unit action of the tibialis anterior and left extensor digitorum brevis.    We agreed to referrals to neurology about the sensorimotor polyneuropathy and endocrinology for his uncontrolled, brittle diabetic condition.

## 2022-08-09 ENCOUNTER — Other Ambulatory Visit: Payer: Self-pay | Admitting: Family Medicine

## 2022-08-09 DIAGNOSIS — N401 Enlarged prostate with lower urinary tract symptoms: Secondary | ICD-10-CM

## 2022-08-12 DIAGNOSIS — E1165 Type 2 diabetes mellitus with hyperglycemia: Secondary | ICD-10-CM | POA: Diagnosis not present

## 2022-08-19 ENCOUNTER — Telehealth: Payer: Self-pay | Admitting: Family Medicine

## 2022-08-19 DIAGNOSIS — M5416 Radiculopathy, lumbar region: Secondary | ICD-10-CM

## 2022-08-19 DIAGNOSIS — M21372 Foot drop, left foot: Secondary | ICD-10-CM

## 2022-08-19 DIAGNOSIS — G629 Polyneuropathy, unspecified: Secondary | ICD-10-CM

## 2022-08-22 ENCOUNTER — Telehealth: Payer: Self-pay

## 2022-08-22 NOTE — Telephone Encounter (Signed)
-----   Message from Leighton Roach McDiarmid, MD sent at 08/22/2022 11:29 AM EDT ----- Regarding: Lumbar spine MRI Please schedule Lumbar Spine MRI without contrast for lumbar radiculopathy with severe sensorineural polyneuropathy and chronic denervation

## 2022-08-22 NOTE — Telephone Encounter (Signed)
Called [facility]. Geraldine Contras stated that they call patients now with the MRI  that. Once they get the appointment made they will update it in Epic. Aquilla Solian, CMA

## 2022-08-22 NOTE — Telephone Encounter (Signed)
Discussed with Colin Keller referral to Colin Keller ([facility]) for evaluation of his severe sensorimotor polyneuropathy and chronic denervation of legs Discussed referral for Lumbar MRI wo contrast to assess for possible secondary crush syndrome.  Colin Keller was in agreement with proceeding with the specialty referral and MRI.

## 2022-08-24 ENCOUNTER — Telehealth: Payer: Self-pay

## 2022-08-24 ENCOUNTER — Other Ambulatory Visit (HOSPITAL_COMMUNITY): Payer: Self-pay | Admitting: Cardiology

## 2022-08-24 DIAGNOSIS — I739 Peripheral vascular disease, unspecified: Secondary | ICD-10-CM

## 2022-08-24 NOTE — Telephone Encounter (Signed)
Attempted to make appt for MRI at [facility].  Huntley Dec at front desk said that she will call patient to make appt. If patient doesn't answer. They will leave a detailed message,and will try again 3 days out from that 1st call. Aquilla Solian, CMA

## 2022-08-30 ENCOUNTER — Other Ambulatory Visit: Payer: Self-pay

## 2022-08-30 ENCOUNTER — Ambulatory Visit (HOSPITAL_COMMUNITY)
Admission: RE | Admit: 2022-08-30 | Discharge: 2022-08-30 | Disposition: A | Discharge status: Home / Self Care | Payer: Medicare HMO | Source: Ambulatory Visit | Attending: Cardiology | Admitting: Cardiology

## 2022-08-30 DIAGNOSIS — M545 Low back pain, unspecified: Secondary | ICD-10-CM

## 2022-08-30 DIAGNOSIS — G8929 Other chronic pain: Secondary | ICD-10-CM

## 2022-08-30 DIAGNOSIS — I739 Peripheral vascular disease, unspecified: Secondary | ICD-10-CM | POA: Diagnosis not present

## 2022-08-30 MED ORDER — HYDROCODONE-ACETAMINOPHEN 10-325 MG PO TABS: 1.0000 | ORAL_TABLET | Freq: Two times a day (BID) | ORAL | 0 refills | Status: DC | PRN

## 2022-08-30 NOTE — Telephone Encounter (Signed)
Patients wife calls nurse line requesting a refill on Hydrocodone.   Lori requests medication be filled on Friday (9/22) vs Sunday (9/24) due to pharmacy being closed on Sundays.  Will forward to PCP.

## 2022-08-31 ENCOUNTER — Telehealth: Payer: Self-pay | Admitting: Cardiology

## 2022-08-31 NOTE — Telephone Encounter (Signed)
pt aware of results  Follow up scheduled  

## 2022-08-31 NOTE — Telephone Encounter (Signed)
Follow Up:    Patient is returning Debra's call from today, concerning his doppler results.

## 2022-09-03 ENCOUNTER — Ambulatory Visit
Admission: RE | Admit: 2022-09-03 | Discharge: 2022-09-03 | Disposition: A | Discharge status: Home / Self Care | Payer: Medicare HMO | Source: Ambulatory Visit | Attending: Family Medicine | Admitting: Family Medicine

## 2022-09-03 DIAGNOSIS — R200 Anesthesia of skin: Secondary | ICD-10-CM | POA: Diagnosis not present

## 2022-09-03 DIAGNOSIS — M545 Low back pain, unspecified: Secondary | ICD-10-CM | POA: Diagnosis not present

## 2022-09-03 DIAGNOSIS — M48061 Spinal stenosis, lumbar region without neurogenic claudication: Secondary | ICD-10-CM | POA: Diagnosis not present

## 2022-09-03 DIAGNOSIS — M5416 Radiculopathy, lumbar region: Secondary | ICD-10-CM

## 2022-09-03 DIAGNOSIS — M21372 Foot drop, left foot: Secondary | ICD-10-CM

## 2022-09-03 DIAGNOSIS — M48062 Spinal stenosis, lumbar region with neurogenic claudication: Secondary | ICD-10-CM

## 2022-09-03 DIAGNOSIS — M4800 Spinal stenosis, site unspecified: Secondary | ICD-10-CM

## 2022-09-06 ENCOUNTER — Encounter: Payer: Self-pay | Admitting: Cardiovascular Disease

## 2022-09-06 ENCOUNTER — Ambulatory Visit: Payer: Medicare HMO | Attending: Cardiovascular Disease | Admitting: Cardiovascular Disease

## 2022-09-06 VITALS — BP 180/64 | HR 75 | Ht 71.0 in | Wt 145.2 lb

## 2022-09-06 DIAGNOSIS — E785 Hyperlipidemia, unspecified: Secondary | ICD-10-CM

## 2022-09-06 DIAGNOSIS — Z01812 Encounter for preprocedural laboratory examination: Secondary | ICD-10-CM | POA: Diagnosis not present

## 2022-09-06 DIAGNOSIS — Z720 Tobacco use: Secondary | ICD-10-CM

## 2022-09-06 DIAGNOSIS — I739 Peripheral vascular disease, unspecified: Secondary | ICD-10-CM

## 2022-09-06 DIAGNOSIS — I2510 Atherosclerotic heart disease of native coronary artery without angina pectoris: Secondary | ICD-10-CM

## 2022-09-06 DIAGNOSIS — I10 Essential (primary) hypertension: Secondary | ICD-10-CM

## 2022-09-06 NOTE — Progress Notes (Signed)
Cardiology Office Note   Date:  09/06/2022   ID:  Colin Keller, DOB Jan 30, 1956, MRN [id number]  PCP:  McDiarmid, Blane Ohara, MD  Cardiologist: Dr. Stanford Breed  No chief complaint on file.      History of Present Illness: Colin Keller is a 66 y.o. male who is here today for follow-up visit regarding peripheral arterial disease.  He has known history of coronary artery disease with previous non-ST elevation myocardial infarction in 2019.  Cardiac catheterization showed severe three-vessel coronary artery disease and he was treated with CABG.  He has chronic medical conditions include hyperlipidemia with intolerance to statins, chronic kidney disease, type 2 diabetes and carotid artery disease. Previous screening for abdominal aortic aneurysm in 2020 was negative.  Moderately elevated velocities were noted in the iliac arteries. He was seen last year for bilateral calf claudication.  Noninvasive vascular studies in September 2022 showed an ABI of 0.76 on the right and 0.63 on the left.  Duplex on the right showed severe stenosis in the distal SFA.  On the left, there was severe stenosis affecting the profunda as well as the distal SFA.  He had recent noninvasive vascular studies which showed an ABI of 0.6 on the right and 0.39 on the left.Duplex on the right showed severe distal SFA stenosis.  On the left, there was severe stenosis in the profunda and distal SFA.  He reports significant worsening of left calf claudication as well as increased numbness in the left foot especially at night.  He is having difficulty sleeping as a result.  He had MRI of the lumbar spine but the results are still pending.  Past Medical History:  Diagnosis Date   Acute diastolic heart failure ([place]) 08/18/2018   Atherosclerosis of both carotid arteries 08/18/2020   Carotid dopplers 08/2019 40-59% bilateral stenosis and right subclavian stenosis   Bilateral carotid artery stenosis 08/18/2020   Right Carotid: Velocities in  the right ICA are consistent with a 60-79%                 stenosis. Non-hemodynamically significant plaque <50% noted in                 the CCA. The ECA appears >50% stenosed. Proximal subclavian                 artery dilatation with elevated and turbulent flow just past the                 narrowing, measuring 1.6 cm.   Left Carotid: Velocities in the left ICA are    CHF (congestive heart failure) (HCC)    Chronic left shoulder pain 03/18/2021   Chronic low back pain without sciatica 10/16/2008   H/o chronic LBP with h/o lumbar disc herniation and intermittent radicular pain  Chronic pain, on stable doses of Norco 10/325 TID. MRI in 2003 showing [place] AT L4-5.  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      CKD (chronic kidney disease), stage III (HCC)    Coronary artery disease    Diabetes mellitus    Dyspnea    GERD (gastroesophageal reflux disease)    Hx of CABG 08/23/2018   LIMA to LAD, RIMA to RCA, SVG to OM3, [facility] via right thigh   Hyperlipidemia    Hypertension    Hypertension associated with diabetes ([place]) 12/22/2008   Qualifier: Diagnosis of  By: Oneal Grout MD, Manuela Schwartz  Hypertensive nephropathy 08/18/2020   Insulin dependent type 2 diabetes mellitus ([place]) 12/22/2020   Peripheral artery disease ([place]) 08/31/2021   [facility] Study lower extremities 08/30/21:            Today's ABI  Today's TBI   Previous ABIPrevious TBI  +-------+-----------+-----------+------------+------------+  Right  0.76       0.31       1.03                      +-------+-----------+-----------+------------+------------+  Left   0.63       0.20       0.95                      +-------+-----------+---------   Proteinuria 08/18/2020   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   LIMA to LAD, RIMA to RCA, SVG to OM3, [facility] via right thigh   Sleeping difficulties 09/08/2019   Subclavian artery stenosis, right ([place]) 08/18/2020   Carotid dopplers 08/2019 40-59% bilateral  stenosis and right subclavian stenosis   Tobacco abuse    Tobacco abuse disorder    Qualifier: Diagnosis of  By: Oneal Grout MD, Manuela Schwartz      Past Surgical History:  Procedure Laterality Date   CORONARY ARTERY BYPASS GRAFT N/A 08/23/2018   Procedure: CORONARY ARTERY BYPASS GRAFTING (CABG) x 3; -Left Internal Mammary Artery to Left Anterior Descending Artery, -Right Internal Mammary Artery to Right Coronary Artery, -Saphenous Vein Graft to Obtuse Marginal;  ENDOSCOPIC HARVEST GREATER SAPHENOUS VEIN  -Right Thigh;  Surgeon: Rexene Alberts, MD;  Location: [facility];  Service: Open Heart Surgery;  Laterality: N/A;   INTRAVASCULAR PRESSURE WIRE/FFR STUDY N/A 08/20/2018   Procedure: INTRAVASCULAR PRESSURE WIRE/FFR STUDY;  Surgeon: Sherren Mocha, MD;  Location: [place] [facility];  Service: Cardiovascular;  Laterality: N/A;   LEFT HEART CATH AND CORONARY ANGIOGRAPHY N/A 08/20/2018   Procedure: LEFT HEART CATH AND CORONARY ANGIOGRAPHY;  Surgeon: Sherren Mocha, MD;  Location: [place] [facility];  Service: Cardiovascular;  Laterality: N/A;   TEE WITHOUT CARDIOVERSION N/A 08/23/2018   Procedure: TRANSESOPHAGEAL ECHOCARDIOGRAM (TEE);  Surgeon: Rexene Alberts, MD;  Location: [facility];  Service: Open Heart Surgery;  Laterality: N/A;     Current Outpatient Medications  Medication Sig Dispense Refill   aspirin 81 MG tablet Take 81 mg by mouth daily.     carvedilol (COREG) 12.5 MG tablet Take 1 tablet (12.5 mg total) by mouth 2 (two) times daily with a meal. 180 tablet 3   Continuous Blood Gluc Receiver (DEXCOM G6 RECEIVER) DEVI 1 Device by Does not apply route as directed. Use with Dexcom G6 sensor and transmitter 1 each 1   Continuous Blood Gluc Sensor (DEXCOM G6 SENSOR) MISC Inject 1 applicator into the skin as directed. Change sensor every 10 days. 3 each 11   Continuous Blood Gluc Transmit (DEXCOM G6 TRANSMITTER) MISC Inject 1 Device into the skin as directed. Reuse 8 times with sensor changes. 1 each 3    Cyanocobalamin (VITAMIN B 12 PO) Take by mouth daily.     Famotidine 20 MG CHEW Chew 20 mg by mouth daily as needed (for reflux).     hydrALAZINE (APRESOLINE) 50 MG tablet Take 1 tablet (50 mg total) by mouth 2 (two) times daily. 180 tablet 3   HYDROcodone-acetaminophen (NORCO) 10-325 MG tablet Take 1 tablet by mouth 2 (two) times daily as needed for moderate pain. 60 tablet 0   Insulin Glargine (  BASAGLAR KWIKPEN) 100 UNIT/ML Inject 16-18 Units into the skin daily. 16.2 mL 1   insulin lispro (HUMALOG) 100 UNIT/ML injection Inject 0.03-0.05 mLs (3-5 Units total) into the skin 2 (two) times daily with a meal. 3-4 units prior to breakfast, 4-5 units prior to evening meal     metFORMIN (GLUCOPHAGE) 500 MG tablet TAKE 2 TABLETS BY MOUTH TWICE DAILY WITHA MEAL 180 tablet 3   PRALUENT 150 MG/ML SOAJ INJECT 150 MG INTO THE SKIN EVERY 14 (FOURTEEN) DAYS. 6 mL 3   tamsulosin (FLOMAX) 0.4 MG CAPS capsule TAKE 1 CAPSULE BY MOUTH DAILY 90 capsule 3   No current facility-administered medications for this visit.    Allergies:   Amlodipine, Amitriptyline, Nortriptyline hcl, and Simvastatin    Social History:  The patient  reports that he has been smoking cigarettes. He started smoking about 52 years ago. He has a 48.00 pack-year smoking history. He has never used smokeless tobacco. He reports that he does not drink alcohol and does not use drugs.   Family History:  The patient's family history includes Diabetes in his mother; Drug abuse in his son; Heart disease in his father and mother; Heart failure in his mother; Sudden death in his brother.    ROS:  Please see the history of present illness.   Otherwise, review of systems are positive for none.   All other systems are reviewed and negative.    PHYSICAL EXAM: VS:  BP (!) 180/64   Pulse 75   Ht 5\' 11"  (1.803 m)   Wt 145 lb 3.2 oz (65.9 kg)   SpO2 97%   BMI 20.25 kg/m  , BMI Body mass index is 20.25 kg/m. GEN: Well nourished, well developed, in no  acute distress  HEENT: normal  Neck: no JVD or masses.  Bilateral carotid bruits Cardiac: RRR; no murmurs, rubs, or gallops,no edema  Respiratory:  clear to auscultation bilaterally, normal work of breathing GI: soft, nontender, nondistended, + BS MS: no deformity or atrophy  Skin: warm and dry, no rash Neuro:  Strength and sensation are intact Psych: euthymic mood, full affect Vascular: Femoral pulses +2 bilaterally.  Distal pulses are not palpable   EKG:  EKG is not ordered today.    Recent Labs: 05/12/2022: BUN 30; Creatinine, Ser 1.48; Potassium 5.4; Sodium 141    Lipid Panel    Component Value Date/Time   CHOL 125 08/03/2021 0850   TRIG 111 08/03/2021 0850   HDL 64 08/03/2021 0850   CHOLHDL 2.0 08/03/2021 0850   CHOLHDL 3.0 08/19/2018 0603   VLDL 14 08/19/2018 0603   LDLCALC 41 08/03/2021 0850   LDLDIRECT 105 09/19/2016 0922      Wt Readings from Last 3 Encounters:  09/06/22 145 lb 3.2 oz (65.9 kg)  06/16/22 144 lb (65.3 kg)  05/24/22 142 lb 6.4 oz (64.6 kg)          No data to display            ASSESSMENT AND PLAN:  1.  Peripheral arterial disease: He has progressed to severe bilateral calf claudication and rest pain on the left side.  His left toe pressure is 0 and he is at high risk for limb loss.  Given this progression, recommend proceeding with abdominal aortogram with left lower extremity angiography and possible endovascular intervention.  I discussed the procedure in details as well as risks and benefits including contrast-induced nephropathy given underlying chronic kidney disease.  We will plan on 4 hours  of hydration before the procedure and using CO2.   He is currently on Pletal but will likely switch to clopidogrel if revascularization is performed.  2.  Coronary artery disease involving native coronary arteries without angina: He seems to be doing well from a cardiac standpoint.  3.  Hyperlipidemia: He is intolerant to statin but doing well  with Praluent with most recent LDL of 41.  4.  Essential hypertension: His blood pressure is elevated today but he is having pain which is likely driving his blood pressure.  5.  Carotid artery disease: Annual Doppler is recommended.  6.  Stage III chronic kidney disease: Seems to be stable.  7.  Tobacco use: I again discussed with him the importance of smoking cessation but he reports inability to quit.    Disposition:   Proceed with angiography on October 11 and follow-up in 1 month.  Signed,  Kathlyn Sacramento, MD  09/06/2022 1:31 PM    [place]

## 2022-09-06 NOTE — H&P (View-Only) (Signed)
Cardiology Office Note   Date:  09/06/2022   ID:  Colin Keller, DOB Jan 19, 1956, MRN [id number]  PCP:  McDiarmid, Blane Ohara, MD  Cardiologist: Dr. Stanford Breed  No chief complaint on file.      History of Present Illness: Colin Keller is a 66 y.o. male who is here today for follow-up visit regarding peripheral arterial disease.  He has known history of coronary artery disease with previous non-ST elevation myocardial infarction in 2019.  Cardiac catheterization showed severe three-vessel coronary artery disease and he was treated with CABG.  He has chronic medical conditions include hyperlipidemia with intolerance to statins, chronic kidney disease, type 2 diabetes and carotid artery disease. Previous screening for abdominal aortic aneurysm in 2020 was negative.  Moderately elevated velocities were noted in the iliac arteries. He was seen last year for bilateral calf claudication.  Noninvasive vascular studies in September 2022 showed an ABI of 0.76 on the right and 0.63 on the left.  Duplex on the right showed severe stenosis in the distal SFA.  On the left, there was severe stenosis affecting the profunda as well as the distal SFA.  He had recent noninvasive vascular studies which showed an ABI of 0.6 on the right and 0.39 on the left.Duplex on the right showed severe distal SFA stenosis.  On the left, there was severe stenosis in the profunda and distal SFA.  He reports significant worsening of left calf claudication as well as increased numbness in the left foot especially at night.  He is having difficulty sleeping as a result.  He had MRI of the lumbar spine but the results are still pending.  Past Medical History:  Diagnosis Date   Acute diastolic heart failure ([place]) 08/18/2018   Atherosclerosis of both carotid arteries 08/18/2020   Carotid dopplers 08/2019 40-59% bilateral stenosis and right subclavian stenosis   Bilateral carotid artery stenosis 08/18/2020   Right Carotid: Velocities in  the right ICA are consistent with a 60-79%                 stenosis. Non-hemodynamically significant plaque <50% noted in                 the CCA. The ECA appears >50% stenosed. Proximal subclavian                 artery dilatation with elevated and turbulent flow just past the                 narrowing, measuring 1.6 cm.   Left Carotid: Velocities in the left ICA are    CHF (congestive heart failure) (HCC)    Chronic left shoulder pain 03/18/2021   Chronic low back pain without sciatica 10/16/2008   H/o chronic LBP with h/o lumbar disc herniation and intermittent radicular pain  Chronic pain, on stable doses of Norco 10/325 TID. MRI in 2003 showing [place] AT L4-5.  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      CKD (chronic kidney disease), stage III (HCC)    Coronary artery disease    Diabetes mellitus    Dyspnea    GERD (gastroesophageal reflux disease)    Hx of CABG 08/23/2018   LIMA to LAD, RIMA to RCA, SVG to OM3, EV[facility] via right thigh   Hyperlipidemia    Hypertension    Hypertension associated with diabetes ([place]) 12/22/2008   Qualifier: Diagnosis of  By: Oneal Grout MD, Manuela Schwartz  Hypertensive nephropathy 08/18/2020   Insulin dependent type 2 diabetes mellitus ([place]) 12/22/2020   Peripheral artery disease ([place]) 08/31/2021   [facility] V[facility] Study lower extremities 08/30/21:            Today's ABI  Today's TBI   Previous ABIPrevious TBI  +-------+-----------+-----------+------------+------------+  Right  0.76       0.31       1.03                      +-------+-----------+-----------+------------+------------+  Left   0.63       0.20       0.95                      +-------+-----------+---------   Proteinuria 08/18/2020   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   LIMA to LAD, RIMA to RCA, SVG to OM3, [facility] via right thigh   Sleeping difficulties 09/08/2019   Subclavian artery stenosis, right ([place]) 08/18/2020   Carotid dopplers 08/2019 40-59% bilateral  stenosis and right subclavian stenosis   Tobacco abuse    Tobacco abuse disorder    Qualifier: Diagnosis of  By: Oneal Grout MD, Manuela Schwartz      Past Surgical History:  Procedure Laterality Date   CORONARY ARTERY BYPASS GRAFT N/A 08/23/2018   Procedure: CORONARY ARTERY BYPASS GRAFTING (CABG) x 3; -Left Internal Mammary Artery to Left Anterior Descending Artery, -Right Internal Mammary Artery to Right Coronary Artery, -Saphenous Vein Graft to Obtuse Marginal;  ENDOSCOPIC HARVEST GREATER SAPHENOUS VEIN  -Right Thigh;  Surgeon: Rexene Alberts, MD;  Location: [facility];  Service: Open Heart Surgery;  Laterality: N/A;   INTRAVASCULAR PRESSURE WIRE/FFR STUDY N/A 08/20/2018   Procedure: INTRAVASCULAR PRESSURE WIRE/FFR STUDY;  Surgeon: Sherren Mocha, MD;  Location: [place] [facility];  Service: Cardiovascular;  Laterality: N/A;   LEFT HEART CATH AND CORONARY ANGIOGRAPHY N/A 08/20/2018   Procedure: LEFT HEART CATH AND CORONARY ANGIOGRAPHY;  Surgeon: Sherren Mocha, MD;  Location: [facility];  Service: Cardiovascular;  Laterality: N/A;   TEE WITHOUT CARDIOVERSION N/A 08/23/2018   Procedure: TRANSESOPHAGEAL ECHOCARDIOGRAM (TEE);  Surgeon: Rexene Alberts, MD;  Location: [facility];  Service: Open Heart Surgery;  Laterality: N/A;     Current Outpatient Medications  Medication Sig Dispense Refill   aspirin 81 MG tablet Take 81 mg by mouth daily.     carvedilol (COREG) 12.5 MG tablet Take 1 tablet (12.5 mg total) by mouth 2 (two) times daily with a meal. 180 tablet 3   Continuous Blood Gluc Receiver (DEXCOM G6 RECEIVER) DEVI 1 Device by Does not apply route as directed. Use with Dexcom G6 sensor and transmitter 1 each 1   Continuous Blood Gluc Sensor (DEXCOM G6 SENSOR) MISC Inject 1 applicator into the skin as directed. Change sensor every 10 days. 3 each 11   Continuous Blood Gluc Transmit (DEXCOM G6 TRANSMITTER) MISC Inject 1 Device into the skin as directed. Reuse 8 times with sensor changes. 1 each 3    Cyanocobalamin (VITAMIN B 12 PO) Take by mouth daily.     Famotidine 20 MG CHEW Chew 20 mg by mouth daily as needed (for reflux).     hydrALAZINE (APRESOLINE) 50 MG tablet Take 1 tablet (50 mg total) by mouth 2 (two) times daily. 180 tablet 3   HYDROcodone-acetaminophen (NORCO) 10-325 MG tablet Take 1 tablet by mouth 2 (two) times daily as needed for moderate pain. 60 tablet 0   Insulin Glargine (  BASAGLAR KWIKPEN) 100 UNIT/ML Inject 16-18 Units into the skin daily. 16.2 mL 1   insulin lispro (HUMALOG) 100 UNIT/ML injection Inject 0.03-0.05 mLs (3-5 Units total) into the skin 2 (two) times daily with a meal. 3-4 units prior to breakfast, 4-5 units prior to evening meal     metFORMIN (GLUCOPHAGE) 500 MG tablet TAKE 2 TABLETS BY MOUTH TWICE DAILY WITHA MEAL 180 tablet 3   PRALUENT 150 MG/ML SOAJ INJECT 150 MG INTO THE SKIN EVERY 14 (FOURTEEN) DAYS. 6 mL 3   tamsulosin (FLOMAX) 0.4 MG CAPS capsule TAKE 1 CAPSULE BY MOUTH DAILY 90 capsule 3   No current facility-administered medications for this visit.    Allergies:   Amlodipine, Amitriptyline, Nortriptyline hcl, and Simvastatin    Social History:  The patient  reports that he has been smoking cigarettes. He started smoking about 52 years ago. He has a 48.00 pack-year smoking history. He has never used smokeless tobacco. He reports that he does not drink alcohol and does not use drugs.   Family History:  The patient's family history includes Diabetes in his mother; Drug abuse in his son; Heart disease in his father and mother; Heart failure in his mother; Sudden death in his brother.    ROS:  Please see the history of present illness.   Otherwise, review of systems are positive for none.   All other systems are reviewed and negative.    PHYSICAL EXAM: VS:  BP (!) 180/64   Pulse 75   Ht 5\' 11"  (1.803 m)   Wt 145 lb 3.2 oz (65.9 kg)   SpO2 97%   BMI 20.25 kg/m  , BMI Body mass index is 20.25 kg/m. GEN: Well nourished, well developed, in no  acute distress  HEENT: normal  Neck: no JVD or masses.  Bilateral carotid bruits Cardiac: RRR; no murmurs, rubs, or gallops,no edema  Respiratory:  clear to auscultation bilaterally, normal work of breathing GI: soft, nontender, nondistended, + BS MS: no deformity or atrophy  Skin: warm and dry, no rash Neuro:  Strength and sensation are intact Psych: euthymic mood, full affect Vascular: Femoral pulses +2 bilaterally.  Distal pulses are not palpable   EKG:  EKG is not ordered today.    Recent Labs: 05/12/2022: BUN 30; Creatinine, Ser 1.48; Potassium 5.4; Sodium 141    Lipid Panel    Component Value Date/Time   CHOL 125 08/03/2021 0850   TRIG 111 08/03/2021 0850   HDL 64 08/03/2021 0850   CHOLHDL 2.0 08/03/2021 0850   CHOLHDL 3.0 08/19/2018 0603   VLDL 14 08/19/2018 0603   LDLCALC 41 08/03/2021 0850   LDLDIRECT 105 09/19/2016 0922      Wt Readings from Last 3 Encounters:  09/06/22 145 lb 3.2 oz (65.9 kg)  06/16/22 144 lb (65.3 kg)  05/24/22 142 lb 6.4 oz (64.6 kg)          No data to display            ASSESSMENT AND PLAN:  1.  Peripheral arterial disease: He has progressed to severe bilateral calf claudication and rest pain on the left side.  His left toe pressure is 0 and he is at high risk for limb loss.  Given this progression, recommend proceeding with abdominal aortogram with left lower extremity angiography and possible endovascular intervention.  I discussed the procedure in details as well as risks and benefits including contrast-induced nephropathy given underlying chronic kidney disease.  We will plan on 4 hours  of hydration before the procedure and using CO2.   He is currently on Pletal but will likely switch to clopidogrel if revascularization is performed.  2.  Coronary artery disease involving native coronary arteries without angina: He seems to be doing well from a cardiac standpoint.  3.  Hyperlipidemia: He is intolerant to statin but doing well  with Praluent with most recent LDL of 41.  4.  Essential hypertension: His blood pressure is elevated today but he is having pain which is likely driving his blood pressure.  5.  Carotid artery disease: Annual Doppler is recommended.  6.  Stage III chronic kidney disease: Seems to be stable.  7.  Tobacco use: I again discussed with him the importance of smoking cessation but he reports inability to quit.    Disposition:   Proceed with angiography on October 11 and follow-up in 1 month.  Signed,  Kathlyn Sacramento, MD  09/06/2022 1:31 PM    [place]

## 2022-09-06 NOTE — Patient Instructions (Addendum)
Medication Instructions:  Your physician recommends that you continue on your current medications as directed. Please refer to the Current Medication list given to you today.  *If you need a refill on your cardiac medications before your next appointment, please call your pharmacy*   Lab Work: TODAY: BMET, CBC If you have labs (blood work) drawn today and your tests are completely normal, you will receive your results only by: [place] (if you have MyChart) OR A paper copy in the mail If you have any lab test that is abnormal or we need to change your treatment, we will call you to review the results.   Testing/Procedures:  [place] A DEPT OF [place] A DEPT OF [place]. [facility] [place] [id number] [place] [address] Dept: [phone] Loc: [phone]  Colin Keller  09/06/2022  You are scheduled for a Peripheral Angiogram on Wednesday, October 11 with Dr. Kathlyn Sacramento.  1. Please arrive at the [facility] (Main Entrance A) at [facility]: [address] [place], [place] [address] at 8:30 AM (This time is two hours before your procedure to ensure your preparation). Free valet parking service is available.   Special note: Every effort is made to have your procedure done on time. Please understand that emergencies sometimes delay scheduled procedures.  2. Diet: Do not eat solid foods after midnight.  The patient may have clear liquids until 5am upon the day of the procedure.  3. Labs: You will need to have blood drawn on TODAY  4. Medication instructions in preparation for your procedure:   Contrast Allergy: No  Do not take Diabetes Med Glucophage (Metformin) on the day of the procedure and HOLD 48 HOURS AFTER THE PROCEDURE.  On the morning of your procedure, take your Aspirin 81 mg and any morning medicines NOT listed above.  You may use sips of water.  5.  Plan for one night stay--bring personal belongings. 6. Bring a current list of your medications and current insurance cards. 7. You MUST have a responsible person to drive you home. 8. Someone MUST be with you the first 24 hours after you arrive home or your discharge will be delayed. 9. Please wear clothes that are easy to get on and off and wear slip-on shoes.  Thank you for allowing [place] to care for you!   -- [place] [facility]    Follow-Up: At [facility], you and your health needs are our priority.  As part of our continuing mission to provide you with exceptional heart care, we have created designated Provider Care Teams.  These Care Teams include your primary Cardiologist (physician) and Advanced Practice Providers (APPs -  Physician Assistants and Nurse Practitioners) who all work together to provide you with the care you need, when you need it.  We recommend signing up for the patient portal called "MyChart".  Sign up information is provided on this After Visit Summary.  MyChart is used to connect with patients for Virtual Visits (Telemedicine).  Patients are able to view lab/test results, encounter notes, upcoming appointments, etc.  Non-urgent messages can be sent to your provider as well.   To learn more about what you can do with MyChart, go to [email or web address].    Your next appointment:   1 month(s)  The format for your next appointment:   In Person  Provider:   Almyra Deforest, PA-C  Other Instructions   Important Information About [place]

## 2022-09-07 LAB — BASIC METABOLIC PANEL
BUN/Creatinine Ratio: 19 (ref 10–24)
BUN: 31 mg/dL — ABNORMAL HIGH (ref 8–27)
CO2: 26 mmol/L (ref 20–29)
Calcium: 9.7 mg/dL (ref 8.6–10.2)
Chloride: 100 mmol/L (ref 96–106)
Creatinine, Ser: 1.63 mg/dL — ABNORMAL HIGH (ref 0.76–1.27)
Glucose: 305 mg/dL — ABNORMAL HIGH (ref 70–99)
Potassium: 5.5 mmol/L — ABNORMAL HIGH (ref 3.5–5.2)
Sodium: 137 mmol/L (ref 134–144)
eGFR: 46 mL/min/{1.73_m2} — ABNORMAL LOW (ref >59)

## 2022-09-07 LAB — CBC WITH DIFFERENTIAL/PLATELET
Basophils Absolute: 0.1 10*3/uL (ref 0.0–0.2)
Basos: 1 %
EOS (ABSOLUTE): 0.5 10*3/uL — ABNORMAL HIGH (ref 0.0–0.4)
Eos: 7 %
Hematocrit: 32.1 % — ABNORMAL LOW (ref 37.5–51.0)
Hemoglobin: 10.9 g/dL — ABNORMAL LOW (ref 13.0–17.7)
Immature Grans (Abs): 0 10*3/uL (ref 0.0–0.1)
Immature Granulocytes: 0 %
Lymphocytes Absolute: 1.8 10*3/uL (ref 0.7–3.1)
Lymphs: 25 %
MCH: 32.2 pg (ref 26.6–33.0)
MCHC: 34 g/dL (ref 31.5–35.7)
MCV: 95 fL (ref 79–97)
Monocytes Absolute: 0.6 10*3/uL (ref 0.1–0.9)
Monocytes: 8 %
Neutrophils Absolute: 4.1 10*3/uL (ref 1.4–7.0)
Neutrophils: 59 %
Platelets: 208 10*3/uL (ref 150–450)
RBC: 3.38 x10E6/uL — ABNORMAL LOW (ref 4.14–5.80)
RDW: 12.1 % (ref 11.6–15.4)
WBC: 7.1 10*3/uL (ref 3.4–10.8)

## 2022-09-08 ENCOUNTER — Other Ambulatory Visit: Payer: Self-pay | Admitting: Family Medicine

## 2022-09-08 DIAGNOSIS — E1165 Type 2 diabetes mellitus with hyperglycemia: Secondary | ICD-10-CM

## 2022-09-11 DIAGNOSIS — E1165 Type 2 diabetes mellitus with hyperglycemia: Secondary | ICD-10-CM | POA: Diagnosis not present

## 2022-09-14 ENCOUNTER — Telehealth: Payer: Self-pay | Admitting: Cardiovascular Disease

## 2022-09-14 ENCOUNTER — Telehealth: Payer: Self-pay | Admitting: Cardiology

## 2022-09-14 NOTE — Telephone Encounter (Signed)
Sharyn Lull LPN already spoke with patient today

## 2022-09-14 NOTE — Telephone Encounter (Signed)
Emailed directions from 9-26 appt to pt. Returned call to pt, explained that the directions for upcoming Arteriogram are in that paperwork and I just emailed them via mychart as well. He will check and go from there. Pt is also asking about co-pay as well. Informed pt that he should ask when he gets there for the procedure. Informed pt that I do not have copay information. Informed pt that he he need assistance with copay issues not to forget to ask at procedure time. Verbalized understanding. He will ask the day of his procedure.

## 2022-09-14 NOTE — Telephone Encounter (Signed)
Patient has some questions about his upcoming procedure and his medications.

## 2022-09-14 NOTE — Telephone Encounter (Signed)
New Message:     Patient's wife called. She says patient  needs instruction and also what to do about his medicine before his procedure.. His procedure on 09-23-22

## 2022-09-15 NOTE — Telephone Encounter (Signed)
I spoke with Colin Keller about his Lumbar MRI results showing lumbar spinal canal stenosis, especially at L2-L3.  I conveyed to Colin Keller that I was uncertain how much his left leg pain, weakness and abnormal sensorimotor EMG/NCS is attributable to his spinal canal stenosis and how much attributable to other causes, in particular his left leg PAD and his diabetic neuropathy.   He is scheduled to see Colin Keller (Neuro) 09/26/22.    Before his visit with Colin Keller, Colin Keller is planning an abdominal aortogram with lower extremities on possible intervention on 09/21/22.   Colin Keller said he would contact Colin Keller office if he were unable to attend his initial consultation due to the possible extremity intervention. He would cancel and reschedule, if needed.

## 2022-09-19 ENCOUNTER — Telehealth: Payer: Self-pay | Admitting: Cardiovascular Disease

## 2022-09-19 NOTE — Telephone Encounter (Signed)
Pt calling because he has a procedure (ABDOMINAL AORTOGRAM W/LOWER EXTREMITY) on Wednesday 09/21/22 and wants to know if he needs to take his glucose monitor off during the surgery.

## 2022-09-19 NOTE — Telephone Encounter (Signed)
Patient asked if he could wear his glucose monitor on his arm for his aortogram. Reassured patient he could wear the monitor.

## 2022-09-20 ENCOUNTER — Telehealth: Payer: Self-pay | Admitting: *Deleted

## 2022-09-20 NOTE — Telephone Encounter (Signed)
Abdominal aortogram scheduled at [facility] for: Wednesday September 21, 2022 12:30 PM Arrival time and place: [place] Entrance A at: 7:30 AM-pre-procedure hydration  Nothing to eat after midnight prior to procedure, clear liquids until 5 AM day of procedure  Medication instructions: -Hold:  Insulin-AM of procedure  Metformin-day of procedure and 48 hours post procedure -Except hold medications usual morning medications can be taken with sips of water including aspirin 81 mg.  Confirmed patient has responsible adult to drive home post procedure and be with patient first 24 hours after arriving home.  Patient reports no new symptoms concerning for COVID-19 in the past 10 days.  Reviewed procedure instructions with patient.

## 2022-09-21 ENCOUNTER — Encounter (HOSPITAL_COMMUNITY): Payer: Self-pay | Admitting: Cardiovascular Disease

## 2022-09-21 ENCOUNTER — Encounter (HOSPITAL_COMMUNITY)
Admission: RE | Disposition: A | Discharge status: Home / Self Care | Payer: Self-pay | Source: Home / Self Care | Attending: Cardiovascular Disease

## 2022-09-21 ENCOUNTER — Other Ambulatory Visit: Payer: Self-pay

## 2022-09-21 ENCOUNTER — Ambulatory Visit (HOSPITAL_COMMUNITY)
Admission: RE | Admit: 2022-09-21 | Discharge: 2022-09-22 | Disposition: A | Discharge status: Home / Self Care | Payer: Medicare HMO | Attending: Cardiovascular Disease | Admitting: Cardiovascular Disease

## 2022-09-21 DIAGNOSIS — N1832 Chronic kidney disease, stage 3b: Secondary | ICD-10-CM | POA: Diagnosis present

## 2022-09-21 DIAGNOSIS — N1831 Chronic kidney disease, stage 3a: Secondary | ICD-10-CM | POA: Diagnosis not present

## 2022-09-21 DIAGNOSIS — I10 Essential (primary) hypertension: Secondary | ICD-10-CM | POA: Diagnosis present

## 2022-09-21 DIAGNOSIS — I2510 Atherosclerotic heart disease of native coronary artery without angina pectoris: Secondary | ICD-10-CM | POA: Insufficient documentation

## 2022-09-21 DIAGNOSIS — I252 Old myocardial infarction: Secondary | ICD-10-CM | POA: Insufficient documentation

## 2022-09-21 DIAGNOSIS — E1122 Type 2 diabetes mellitus with diabetic chronic kidney disease: Secondary | ICD-10-CM | POA: Insufficient documentation

## 2022-09-21 DIAGNOSIS — F17210 Nicotine dependence, cigarettes, uncomplicated: Secondary | ICD-10-CM | POA: Insufficient documentation

## 2022-09-21 DIAGNOSIS — Z794 Long term (current) use of insulin: Secondary | ICD-10-CM | POA: Diagnosis not present

## 2022-09-21 DIAGNOSIS — Z7984 Long term (current) use of oral hypoglycemic drugs: Secondary | ICD-10-CM | POA: Diagnosis not present

## 2022-09-21 DIAGNOSIS — Z951 Presence of aortocoronary bypass graft: Secondary | ICD-10-CM | POA: Diagnosis not present

## 2022-09-21 DIAGNOSIS — I6523 Occlusion and stenosis of bilateral carotid arteries: Secondary | ICD-10-CM | POA: Insufficient documentation

## 2022-09-21 DIAGNOSIS — E1169 Type 2 diabetes mellitus with other specified complication: Secondary | ICD-10-CM | POA: Diagnosis present

## 2022-09-21 DIAGNOSIS — I129 Hypertensive chronic kidney disease with stage 1 through stage 4 chronic kidney disease, or unspecified chronic kidney disease: Secondary | ICD-10-CM | POA: Diagnosis not present

## 2022-09-21 DIAGNOSIS — I739 Peripheral vascular disease, unspecified: Secondary | ICD-10-CM | POA: Diagnosis present

## 2022-09-21 DIAGNOSIS — M48062 Spinal stenosis, lumbar region with neurogenic claudication: Secondary | ICD-10-CM

## 2022-09-21 DIAGNOSIS — M4800 Spinal stenosis, site unspecified: Secondary | ICD-10-CM

## 2022-09-21 DIAGNOSIS — I70222 Atherosclerosis of native arteries of extremities with rest pain, left leg: Secondary | ICD-10-CM | POA: Insufficient documentation

## 2022-09-21 DIAGNOSIS — I70213 Atherosclerosis of native arteries of extremities with intermittent claudication, bilateral legs: Secondary | ICD-10-CM | POA: Insufficient documentation

## 2022-09-21 DIAGNOSIS — D649 Anemia, unspecified: Secondary | ICD-10-CM

## 2022-09-21 DIAGNOSIS — E785 Hyperlipidemia, unspecified: Secondary | ICD-10-CM | POA: Insufficient documentation

## 2022-09-21 DIAGNOSIS — E1151 Type 2 diabetes mellitus with diabetic peripheral angiopathy without gangrene: Secondary | ICD-10-CM | POA: Diagnosis present

## 2022-09-21 DIAGNOSIS — I152 Hypertension secondary to endocrine disorders: Secondary | ICD-10-CM | POA: Diagnosis present

## 2022-09-21 DIAGNOSIS — Z7902 Long term (current) use of antithrombotics/antiplatelets: Secondary | ICD-10-CM

## 2022-09-21 DIAGNOSIS — M48061 Spinal stenosis, lumbar region without neurogenic claudication: Secondary | ICD-10-CM

## 2022-09-21 HISTORY — DX: Long term (current) use of antithrombotics/antiplatelets: Z79.02

## 2022-09-21 HISTORY — PX: ABDOMINAL AORTOGRAM W/LOWER EXTREMITY: CATH118223

## 2022-09-21 HISTORY — PX: PERIPHERAL VASCULAR BALLOON ANGIOPLASTY: CATH118281

## 2022-09-21 HISTORY — PX: PERIPHERAL VASCULAR ATHERECTOMY: CATH118256

## 2022-09-21 LAB — GLUCOSE, CAPILLARY
Glucose-Capillary: 322 mg/dL — ABNORMAL HIGH (ref 70–99)
Glucose-Capillary: 134 mg/dL — ABNORMAL HIGH (ref 70–99)

## 2022-09-21 LAB — POCT ACTIVATED CLOTTING TIME
Activated Clotting Time: 191 seconds
Activated Clotting Time: 203 seconds
Activated Clotting Time: 227 seconds

## 2022-09-21 SURGERY — ABDOMINAL AORTOGRAM W/LOWER EXTREMITY
Anesthesia: LOCAL

## 2022-09-21 MED ORDER — SODIUM CHLORIDE 0.9% FLUSH: 3.0000 mL | Freq: Two times a day (BID) | INTRAVENOUS | Status: DC

## 2022-09-21 MED ORDER — ASPIRIN 81 MG PO CHEW
81.0000 mg | CHEWABLE_TABLET | Freq: Every day | ORAL | Status: DC
  Administered 2022-09-22: 81 mg via ORAL
  Filled 2022-09-21: qty 1

## 2022-09-21 MED ORDER — INSULIN ASPART 100 UNIT/ML IJ SOLN
5.0000 [IU] | Freq: Once | INTRAMUSCULAR | Status: AC
  Administered 2022-09-21: 5 [IU] via SUBCUTANEOUS
  Filled 2022-09-21: qty 1

## 2022-09-21 MED ORDER — FENTANYL CITRATE (PF) 100 MCG/2ML IJ SOLN
INTRAMUSCULAR | Status: AC
  Filled 2022-09-21: qty 2

## 2022-09-21 MED ORDER — NITROGLYCERIN 1 MG/10 ML FOR IR/CATH LAB
INTRA_ARTERIAL | Status: DC | PRN
  Administered 2022-09-21 (×6): 200 ug via INTRA_ARTERIAL

## 2022-09-21 MED ORDER — VERAPAMIL HCL 2.5 MG/ML IV SOLN
INTRAVENOUS | Status: AC
  Filled 2022-09-21: qty 2

## 2022-09-21 MED ORDER — HEPARIN SODIUM (PORCINE) 1000 UNIT/ML IJ SOLN
INTRAMUSCULAR | Status: AC
  Filled 2022-09-21: qty 10

## 2022-09-21 MED ORDER — IODIXANOL 320 MG/ML IV SOLN
INTRAVENOUS | Status: DC | PRN
  Administered 2022-09-21: 105 mL

## 2022-09-21 MED ORDER — CARVEDILOL 12.5 MG PO TABS
12.5000 mg | ORAL_TABLET | Freq: Two times a day (BID) | ORAL | Status: DC
  Administered 2022-09-21 – 2022-09-22 (×2): 12.5 mg via ORAL
  Filled 2022-09-21 (×2): qty 1

## 2022-09-21 MED ORDER — SODIUM CHLORIDE 0.9% FLUSH
3.0000 mL | Freq: Two times a day (BID) | INTRAVENOUS | Status: DC
  Administered 2022-09-22: 3 mL via INTRAVENOUS

## 2022-09-21 MED ORDER — SODIUM CHLORIDE 0.9 % IV SOLN: 250.0000 mL | INTRAVENOUS | Status: DC | PRN

## 2022-09-21 MED ORDER — BASAGLAR KWIKPEN 100 UNIT/ML ~~LOC~~ SOPN: 16.0000 [IU] | PEN_INJECTOR | Freq: Every day | SUBCUTANEOUS | Status: DC

## 2022-09-21 MED ORDER — TAMSULOSIN HCL 0.4 MG PO CAPS
0.4000 mg | ORAL_CAPSULE | Freq: Every day | ORAL | Status: DC
  Administered 2022-09-22: 0.4 mg via ORAL
  Filled 2022-09-21: qty 1

## 2022-09-21 MED ORDER — SODIUM CHLORIDE 0.9% FLUSH: 3.0000 mL | INTRAVENOUS | Status: DC | PRN

## 2022-09-21 MED ORDER — CLOPIDOGREL BISULFATE 300 MG PO TABS
ORAL_TABLET | ORAL | Status: AC
  Filled 2022-09-21: qty 1

## 2022-09-21 MED ORDER — NITROGLYCERIN IN D5W 200-5 MCG/ML-% IV SOLN
INTRAVENOUS | Status: AC
  Filled 2022-09-21: qty 250

## 2022-09-21 MED ORDER — SODIUM CHLORIDE 0.9 % WEIGHT BASED INFUSION
1.0000 mL/kg/h | INTRAVENOUS | Status: AC
  Administered 2022-09-21: 1 mL/kg/h via INTRAVENOUS

## 2022-09-21 MED ORDER — LABETALOL HCL 5 MG/ML IV SOLN
INTRAVENOUS | Status: AC
  Filled 2022-09-21: qty 4

## 2022-09-21 MED ORDER — ONDANSETRON HCL 4 MG/2ML IJ SOLN: 4.0000 mg | Freq: Four times a day (QID) | INTRAMUSCULAR | Status: DC | PRN

## 2022-09-21 MED ORDER — ACETAMINOPHEN 325 MG PO TABS
650.0000 mg | ORAL_TABLET | ORAL | Status: DC | PRN
  Administered 2022-09-22: 325 mg via ORAL
  Administered 2022-09-22: 650 mg via ORAL
  Filled 2022-09-21 (×2): qty 2

## 2022-09-21 MED ORDER — CLOPIDOGREL BISULFATE 300 MG PO TABS
ORAL_TABLET | ORAL | Status: DC | PRN
  Administered 2022-09-21: 300 mg via ORAL

## 2022-09-21 MED ORDER — SODIUM CHLORIDE 0.9 % IV SOLN: INTRAVENOUS | Status: DC

## 2022-09-21 MED ORDER — FENTANYL CITRATE (PF) 100 MCG/2ML IJ SOLN
INTRAMUSCULAR | Status: DC | PRN
  Administered 2022-09-21: 25 ug via INTRAVENOUS
  Administered 2022-09-21 (×2): 50 ug via INTRAVENOUS

## 2022-09-21 MED ORDER — CLOPIDOGREL BISULFATE 75 MG PO TABS
75.0000 mg | ORAL_TABLET | Freq: Every day | ORAL | Status: DC
  Administered 2022-09-22: 75 mg via ORAL
  Filled 2022-09-21: qty 1

## 2022-09-21 MED ORDER — HYDRALAZINE HCL 50 MG PO TABS
50.0000 mg | ORAL_TABLET | Freq: Three times a day (TID) | ORAL | Status: DC
  Administered 2022-09-21 – 2022-09-22 (×2): 50 mg via ORAL
  Filled 2022-09-21 (×2): qty 1

## 2022-09-21 MED ORDER — HEPARIN SODIUM (PORCINE) 1000 UNIT/ML IJ SOLN
INTRAMUSCULAR | Status: DC | PRN
  Administered 2022-09-21 (×2): 3000 [IU] via INTRAVENOUS
  Administered 2022-09-21: 6000 [IU] via INTRAVENOUS

## 2022-09-21 MED ORDER — MIDAZOLAM HCL 2 MG/2ML IJ SOLN
INTRAMUSCULAR | Status: DC | PRN
  Administered 2022-09-21 (×2): 1 mg via INTRAVENOUS

## 2022-09-21 MED ORDER — INSULIN GLARGINE-YFGN 100 UNIT/ML ~~LOC~~ SOLN
16.0000 [IU] | Freq: Every day | SUBCUTANEOUS | Status: DC
  Administered 2022-09-21: 16 [IU] via SUBCUTANEOUS
  Filled 2022-09-21 (×2): qty 0.16

## 2022-09-21 MED ORDER — LIDOCAINE HCL (PF) 1 % IJ SOLN
INTRAMUSCULAR | Status: DC | PRN
  Administered 2022-09-21: 15 mL

## 2022-09-21 MED ORDER — HEPARIN (PORCINE) IN NACL 1000-0.9 UT/500ML-% IV SOLN
INTRAVENOUS | Status: DC | PRN
  Administered 2022-09-21 (×2): 500 mL

## 2022-09-21 MED ORDER — LABETALOL HCL 5 MG/ML IV SOLN
10.0000 mg | INTRAVENOUS | Status: DC | PRN
  Administered 2022-09-22 (×3): 10 mg via INTRAVENOUS
  Filled 2022-09-21 (×3): qty 4

## 2022-09-21 MED ORDER — LABETALOL HCL 5 MG/ML IV SOLN
INTRAVENOUS | Status: DC | PRN
  Administered 2022-09-21 (×2): 10 mg via INTRAVENOUS

## 2022-09-21 MED ORDER — HYDROCODONE-ACETAMINOPHEN 10-325 MG PO TABS
1.0000 | ORAL_TABLET | Freq: Two times a day (BID) | ORAL | Status: DC | PRN
  Administered 2022-09-21 – 2022-09-22 (×3): 1 via ORAL
  Filled 2022-09-21 (×3): qty 1

## 2022-09-21 MED ORDER — ASPIRIN 81 MG PO CHEW: 81.0000 mg | CHEWABLE_TABLET | ORAL | Status: DC

## 2022-09-21 MED ORDER — MIDAZOLAM HCL 2 MG/2ML IJ SOLN
INTRAMUSCULAR | Status: AC
  Filled 2022-09-21: qty 2

## 2022-09-21 MED ORDER — VIPERSLIDE LUBRICANT OPTIME
TOPICAL | Status: DC | PRN
  Administered 2022-09-21: 1000 mL via SURGICAL_CAVITY

## 2022-09-21 MED ORDER — FAMOTIDINE 20 MG PO TABS
20.0000 mg | ORAL_TABLET | Freq: Every day | ORAL | Status: DC
  Administered 2022-09-22: 20 mg via ORAL
  Filled 2022-09-21: qty 1

## 2022-09-21 SURGICAL SUPPLY — 30 items
BALLN ADMIRAL INPACT 5X250 (BALLOONS) ×2
BALLN COYOTE OTW 4X150X150 (BALLOONS) ×2
BALLN IN.PACT DCB 6X120 (BALLOONS) ×2
BALLOON ADMIRAL INPACT 5X250 (BALLOONS) IMPLANT
BALLOON COYOTE OTW 4X150X150 (BALLOONS) IMPLANT
CATH ANGIO 5F PIGTAIL 65CM (CATHETERS) IMPLANT
CATH CROSS OVER TEMPO 5F (CATHETERS) IMPLANT
CATH STRAIGHT 5FR 65CM (CATHETERS) IMPLANT
DCB IN.PACT 6X120 (BALLOONS) IMPLANT
DEVICE CLOSURE MYNXGRIP 6/7F (Vascular Products) IMPLANT
DEVICE EMBOSHIELD NAV6 4.0-7.0 (FILTER) IMPLANT
DIAMONDBACK SOLID OAS 1.5MM (CATHETERS) ×2
KIT ANGIASSIST CO2 SYSTEM (KITS) IMPLANT
KIT ENCORE 26 ADVANTAGE (KITS) IMPLANT
KIT MICROPUNCTURE NIT STIFF (SHEATH) IMPLANT
KIT PV (KITS) ×2 IMPLANT
LUBRICANT VIPERSLIDE CORONARY (MISCELLANEOUS) IMPLANT
SHEATH PINNACLE 5F 10CM (SHEATH) IMPLANT
SHEATH PINNACLE 7F 10CM (SHEATH) IMPLANT
SHEATH PINNACLE MP 7F 45CM (SHEATH) IMPLANT
SHEATH PROBE COVER 6X72 (BAG) IMPLANT
STOPCOCK MORSE 400PSI 3WAY (MISCELLANEOUS) IMPLANT
SYR MEDRAD MARK 7 150ML (SYRINGE) ×2 IMPLANT
SYSTEM DIMNDBCK SLD OAS 1.5MM (CATHETERS) IMPLANT
TAPE SHOOT N SEE (TAPE) IMPLANT
TRANSDUCER W/STOPCOCK (MISCELLANEOUS) ×2 IMPLANT
TRAY PV CATH (CUSTOM PROCEDURE TRAY) ×2 IMPLANT
WIRE HITORQ VERSACORE ST 145CM (WIRE) IMPLANT
WIRE RUNTHROUGH IZANAI 014 300 (WIRE) IMPLANT
WIRE VIPER ADVANCE .017X335CM (WIRE) IMPLANT

## 2022-09-21 NOTE — Interval H&P Note (Signed)
History and Physical Interval Note:  09/21/2022 1:37 PM  Colin Keller  has presented today for surgery, with the diagnosis of pad.  The various methods of treatment have been discussed with the patient and family. After consideration of risks, benefits and other options for treatment, the patient has consented to  Procedure(s): ABDOMINAL AORTOGRAM W/LOWER EXTREMITY (N/A) as a surgical intervention.  The patient's history has been reviewed, patient examined, no change in status, stable for surgery.  I have reviewed the patient's chart and labs.  Questions were answered to the patient's satisfaction.     Kathlyn [place]

## 2022-09-22 ENCOUNTER — Encounter (HOSPITAL_COMMUNITY): Payer: Self-pay | Admitting: Cardiovascular Disease

## 2022-09-22 ENCOUNTER — Telehealth (HOSPITAL_COMMUNITY): Payer: Self-pay | Admitting: Pharmacy Technician

## 2022-09-22 ENCOUNTER — Other Ambulatory Visit (HOSPITAL_COMMUNITY): Payer: Self-pay

## 2022-09-22 DIAGNOSIS — D649 Anemia, unspecified: Secondary | ICD-10-CM

## 2022-09-22 DIAGNOSIS — E785 Hyperlipidemia, unspecified: Secondary | ICD-10-CM | POA: Diagnosis not present

## 2022-09-22 DIAGNOSIS — I70222 Atherosclerosis of native arteries of extremities with rest pain, left leg: Secondary | ICD-10-CM | POA: Diagnosis not present

## 2022-09-22 DIAGNOSIS — I739 Peripheral vascular disease, unspecified: Secondary | ICD-10-CM

## 2022-09-22 DIAGNOSIS — I70213 Atherosclerosis of native arteries of extremities with intermittent claudication, bilateral legs: Secondary | ICD-10-CM | POA: Diagnosis not present

## 2022-09-22 DIAGNOSIS — I2510 Atherosclerotic heart disease of native coronary artery without angina pectoris: Secondary | ICD-10-CM | POA: Diagnosis not present

## 2022-09-22 DIAGNOSIS — E1151 Type 2 diabetes mellitus with diabetic peripheral angiopathy without gangrene: Secondary | ICD-10-CM | POA: Diagnosis not present

## 2022-09-22 DIAGNOSIS — M4800 Spinal stenosis, site unspecified: Secondary | ICD-10-CM

## 2022-09-22 DIAGNOSIS — Z951 Presence of aortocoronary bypass graft: Secondary | ICD-10-CM | POA: Diagnosis not present

## 2022-09-22 DIAGNOSIS — N1831 Chronic kidney disease, stage 3a: Secondary | ICD-10-CM | POA: Diagnosis not present

## 2022-09-22 DIAGNOSIS — I129 Hypertensive chronic kidney disease with stage 1 through stage 4 chronic kidney disease, or unspecified chronic kidney disease: Secondary | ICD-10-CM | POA: Diagnosis not present

## 2022-09-22 DIAGNOSIS — E1122 Type 2 diabetes mellitus with diabetic chronic kidney disease: Secondary | ICD-10-CM | POA: Diagnosis not present

## 2022-09-22 LAB — BASIC METABOLIC PANEL
Anion gap: 7 (ref 5–15)
BUN: 25 mg/dL — ABNORMAL HIGH (ref 8–23)
CO2: 21 mmol/L — ABNORMAL LOW (ref 22–32)
Calcium: 8.6 mg/dL — ABNORMAL LOW (ref 8.9–10.3)
Chloride: 108 mmol/L (ref 98–111)
Creatinine, Ser: 1.4 mg/dL — ABNORMAL HIGH (ref 0.61–1.24)
GFR, Estimated: 55 mL/min — ABNORMAL LOW (ref >60)
Glucose, Bld: 331 mg/dL — ABNORMAL HIGH (ref 70–99)
Potassium: 4.1 mmol/L (ref 3.5–5.1)
Sodium: 136 mmol/L (ref 135–145)

## 2022-09-22 LAB — CBC
HCT: 28.4 % — ABNORMAL LOW (ref 39.0–52.0)
Hemoglobin: 9.4 g/dL — ABNORMAL LOW (ref 13.0–17.0)
MCH: 32 pg (ref 26.0–34.0)
MCHC: 33.1 g/dL (ref 30.0–36.0)
MCV: 96.6 fL (ref 80.0–100.0)
Platelets: 200 10*3/uL (ref 150–400)
RBC: 2.94 MIL/uL — ABNORMAL LOW (ref 4.22–5.81)
RDW: 13.2 % (ref 11.5–15.5)
WBC: 8.5 10*3/uL (ref 4.0–10.5)
nRBC: 0 % (ref 0.0–0.2)

## 2022-09-22 LAB — LIPID PANEL
Cholesterol: 118 mg/dL (ref 0–200)
HDL: 59 mg/dL (ref >40)
LDL Cholesterol: 42 mg/dL (ref 0–99)
Total CHOL/HDL Ratio: 2 RATIO
Triglycerides: 84 mg/dL (ref <150)
VLDL: 17 mg/dL (ref 0–40)

## 2022-09-22 LAB — HEMOGLOBIN A1C
Hgb A1c MFr Bld: 9.6 % — ABNORMAL HIGH (ref 4.8–5.6)
Mean Plasma Glucose: 228.82 mg/dL

## 2022-09-22 MED ORDER — CLOPIDOGREL BISULFATE 75 MG PO TABS: 75.0000 mg | ORAL_TABLET | Freq: Every day | ORAL | 1 refills | Status: DC

## 2022-09-22 MED ORDER — HYDRALAZINE HCL 50 MG PO TABS: 50.0000 mg | ORAL_TABLET | Freq: Three times a day (TID) | ORAL | 1 refills | Status: DC

## 2022-09-22 MED ORDER — INSULIN ASPART 100 UNIT/ML IJ SOLN: 0.0000 [IU] | Freq: Three times a day (TID) | INTRAMUSCULAR | Status: DC

## 2022-09-22 NOTE — Discharge Summary (Addendum)
Discharge Summary    Patient ID: Colin Keller MRN: [id number]; DOB: 1956-07-13  Admit date: 09/21/2022 Discharge date: 09/22/2022  PCP:  McDiarmid, Blane Ohara, MD   [facility] Prov[facility] Cardiologist:  Kirk Ruths, MD        Discharge Diagnoses    Principal Problem:   PAD (peripheral artery disease) ([place]) Active Problems:   Hyperlipidemia associated with type 2 diabetes mellitus ([place])   Hypertension associated with diabetes ([place])   DM (diabetes mellitus), type 2 with peripheral vascular complications ([place])   CKD stage G3a/A3, GFR 45-59 and albumin creatinine ratio >300 mg/g ([facility])   Spinal stenosis   Normocytic anemia    Diagnostic Studies/Procedures    PV Angio 09/21/22 1.  No significant aortoiliac disease. 2.  Left lower extremity: Severe heavily calcified disease affecting the proximal as well as distal SFA with three-vessel runoff below the knee.  The distal SFA is occluded. 3.  Successful orbital atherectomy and drug coated balloon angioplasty to the left SFA.   Recommendations: Dual antiplatelet therapy for at least 6 months. Rest of treatment of risk factors. Hydrate overnight due to chronic kidney disease.  105 mL of contrast was used. Staged right lower extremity angiography can be considered _____________   History of Present Illness     Colin Keller is a 66 y.o. male with CAD s/p NSTEMI/CABG in 2019, HLD intolerant to statins, DM2, CKD stage 3a, carotid artery disease, PAD, chronic combined CHF, HTN, tobacco abuse presented to the hospital for planned PV Angio. He was recently seen in the office by Dr. Fletcher Anon with increasing left leg claudication. He had recent noninvasive vascular studies which showed an ABI of 0.6 on the right and 0.39 on the left.Duplex on the right showed severe distal SFA stenosis.  On the left, there was severe stenosis in the profunda and distal SFA.  Hospital Course     1. PAD - underwent PV angio yesterday with  procedure details above, s/p Successful orbital atherectomy and drug coated balloon angioplasty to the left SFA; to consider staged RLE angiography - recommended for DAPT for at least 6 months and otherwise treatment of risk factors - Plavix added to ASA - per Dr. Harrell Gave: Does have femoral bruits, cannot see that this has been documented before, but has had carotid and abdominal bruits reported previously, suspect this is 2/2 PAD as site is stable; OK to discontinue Pletal at discharge but can reassess for resumption as outpatient if symptoms worsen - has PV f/u 10/06/22   2. Essential HTN - current rx: carvedilol 12.5mg  BID, hydralazine 50mg  q8hr (increased from BID), tamsulosin 0.4mg  daily - per Dr. Harrell Gave, continue the TID dosing of hydralazine and follow up as outpatient - will relay parameters for monitoring BP at home to patient    3. Hyperlipidemia - statin intolerant, on Praluent as OP, LDL 42   4. DM - continue home insulin regimen at discharge except reviewed with patient to hold metformin 48 hours post-angio; pt knows to contact pharmacy for refills from PCP   5. CKD stage 3a - creatinine appears at baseline post-intervention - per Dr. Jacalyn Lefevre notes, follows with nephrology   6. Normocytic anemia - chronic appearing by labs, some decline post-procedurally from 10.9-9.4 suspect related to procedure/hydration - can consider recheck as OP   7. Spinal stenosis - outpatient lumbar MRI showed findings c/w spinal stenosis - per PCP note 10/5, "I conveyed to Colin Keller that I was uncertain how much his left  leg pain, weakness and abnormal sensorimotor EMG/NCS is attributable to his spinal canal stenosis and how much attributable to other causes, in particular his left leg PAD and his diabetic neuropathy. He is scheduled to see Dr Leta Baptist (Neuro) 09/26/22" - further management per medicine  Dr. Harrell Gave has seen and examined the patient today and feels he is stable for  discharge. Instructions and medications are outlined below. Will also send msg to Dr. Fletcher Anon to inquire his preference for repeat duplex prior to follow-up.       Did the patient have an acute coronary syndrome (MI, NSTEMI, STEMI, etc) this admission?:  No                               Did the patient have a percutaneous coronary intervention (stent / angioplasty)?:  No.          _____________  Discharge Vitals Blood pressure (!) 186/67, pulse 65, temperature 97.9 F (36.6 C), temperature source Oral, resp. rate 16, height 5\' 11"  (1.803 m), weight 65.8 kg, SpO2 99 %.  Filed Weights   09/21/22 0749  Weight: 65.8 kg    Labs & Radiologic Studies    CBC Recent Labs    09/22/22 0055  WBC 8.5  HGB 9.4*  HCT 28.4*  MCV 96.6  PLT [id number]   Basic Metabolic Panel Recent Labs    09/22/22 0055  NA 136  K 4.1  CL 108  CO2 21*  GLUCOSE 331*  BUN 25*  CREATININE 1.40*  CALCIUM 8.6*   Liver Function Tests No results for input(s): "AST", "ALT", "ALKPHOS", "BILITOT", "PROT", "ALBUMIN" in the last 72 hours. No results for input(s): "LIPASE", "AMYLASE" in the last 72 hours. High Sensitivity Troponin:   No results for input(s): "TROPONINIHS" in the last 720 hours.  BNP Invalid input(s): "POCBNP" D-Dimer No results for input(s): "DDIMER" in the last 72 hours. Hemoglobin A1C Recent Labs    09/22/22 0055  HGBA1C 9.6*   Fasting Lipid Panel Recent Labs    09/22/22 0055  CHOL 118  HDL 59  LDLCALC 42  TRIG 84  CHOLHDL 2.0   Thyroid Function Tests No results for input(s): "TSH", "T4TOTAL", "T3FREE", "THYROIDAB" in the last 72 hours.  Invalid input(s): "FREET3" _____________  PERIPHERAL VASCULAR CATHETERIZATION  Result Date: 09/21/2022 1.  No significant aortoiliac disease. 2.  Left lower extremity: Severe heavily calcified disease affecting the proximal as well as distal SFA with three-vessel runoff below the knee.  The distal SFA is occluded. 3.  Successful orbital  atherectomy and drug coated balloon angioplasty to the left SFA. Recommendations: Dual antiplatelet therapy for at least 6 months. Rest of treatment of risk factors. Hydrate overnight due to chronic kidney disease.  105 mL of contrast was used. Staged right lower extremity angiography can be considered   Colin Lumbar Spine Wo Contrast  Result Date: 09/06/2022 CLINICAL DATA:  Lumbar radiculopathy, no red flags, no prior management leg sensorimotor neuropathy with left foot drop. Left leg numbness and low back pain for 2-3 months. EXAM: MRI LUMBAR SPINE WITHOUT CONTRAST TECHNIQUE: Multiplanar, multisequence Colin imaging of the lumbar spine was performed. No intravenous contrast was administered. COMPARISON:  None Available. FINDINGS: Segmentation:  Standard. Alignment:  Mild levoscoliosis.  Grade 1 retrolisthesis at L3-4. Vertebrae:  No fracture, evidence of discitis, or bone lesion. Conus medullaris and cauda equina: Conus extends to the T12 level. Conus and cauda equina appear normal. Paraspinal and other  soft tissues: Negative. Disc levels: L1-L2: Normal disc space and facet joints. No spinal canal stenosis. No neural foraminal stenosis. L2-L3: Large left asymmetric disc bulge with superimposed right subarticular extrusion with inferior migration. Severe spinal canal stenosis. Mass effect in the right subarticular recess. No neural foraminal stenosis. L3-L4: Medium-sized disc bulge. Moderate spinal canal stenosis. Bilateral narrowing of the subarticular recesses. Moderate right neural foraminal stenosis. L4-L5: Moderate facet hypertrophy with left asymmetric disc bulge and endplate spurring. Moderate spinal canal stenosis. Right subarticular recess stenosis. Mild right and moderate left neural foraminal stenosis. L5-S1: Mild facet hypertrophy with a 5 mm synovial cyst projecting from the anterior aspect of the right facet. No disc herniation. No spinal canal stenosis. No neural foraminal stenosis. Visualized sacrum:  Normal. IMPRESSION: 1. Severe spinal canal stenosis at L2-L3 due to large left asymmetric disc bulge with superimposed right subarticular extrusion. 2. Moderate spinal canal stenosis and moderate right neural foraminal stenosis at L3-L4. 3. Moderate spinal canal stenosis and moderate left neural foraminal stenosis at L4-L5. 4. A 5 mm synovial cyst projecting from the anterior aspect of the right L5-S1 facet. Electronically Signed   By: Ulyses Jarred M.D.   On: 09/06/2022 11:39   VAS [place] ABI WITH/WO TBI  Result Date: 08/30/2022  LOWER EXTREMITY DOPPLER STUDY Patient Name:  Colin Keller  Date of Exam:   08/30/2022 Medical Rec #: [id number]      Accession #:    [id number] Date of Birth: 1956/03/24      Patient Gender: M Patient Age:   52 years Exam Location:  [facility] Procedure:      VAS [place] ABI WITH/WO TBI Referring Phys: BRIAN CRENSHAW --------------------------------------------------------------------------------  Indications: Claudication, and rest pain. Patient presents for follow-up on              peripheral arterial disease. He reports increased numbness in his              left feet and toes as well as neuropathy symptoms. He has bilateral              calf claudication symptoms after walking about 100 yards and has to              stop and rest after walking or exerting himself. He was supposed to              start Pletal to help with these symptoms but patient reports he was              concerned to take it due to his heart disease. Patient also reports              he has back issues and is having an MRI this Saturday to further              evaluate. High Risk Factors: Hypertension, hyperlipidemia, Diabetes, past history of                    smoking, coronary artery disease. Other Factors: S/P CABG X 3.  Comparison Study: Previous ABIs performed 08/30/21 were 0.76 on the right and                   0.63 on the left. Performing Technologist: Mariane Masters RVT  Examination Guidelines: A complete  evaluation includes at minimum, Doppler waveform signals and systolic blood pressure reading at the level of bilateral brachial, anterior tibial, and posterior tibial arteries, when vessel segments are accessible. Bilateral testing  is considered an integral part of a complete examination. Photoelectric Plethysmograph (PPG) waveforms and toe systolic pressure readings are included as required and additional duplex testing as needed. Limited examinations for reoccurring indications may be performed as noted.  ABI Findings: +---------+------------------+-----+----------+---------+ Right    Rt Pressure (mmHg)IndexWaveform  Comment   +---------+------------------+-----+----------+---------+ Brachial 153                                        +---------+------------------+-----+----------+---------+ PTA      99                0.63 monophasic          +---------+------------------+-----+----------+---------+ PERO                            absent    inaudible +---------+------------------+-----+----------+---------+ DP       90                0.57 monophasic          +---------+------------------+-----+----------+---------+ Great Toe21                0.13 Abnormal            +---------+------------------+-----+----------+---------+ +---------+------------------+-----+-------------------+-------+ Left     Lt Pressure (mmHg)IndexWaveform           Comment +---------+------------------+-----+-------------------+-------+ Brachial 157                                               +---------+------------------+-----+-------------------+-------+ PTA      62                0.39 dampened monophasic        +---------+------------------+-----+-------------------+-------+ PERO     49                0.32 dampened monophasic        +---------+------------------+-----+-------------------+-------+ DP       60                0.38 dampened monophasic         +---------+------------------+-----+-------------------+-------+ Great Toe0                 0.00 Absent                     +---------+------------------+-----+-------------------+-------+ +-------+-----------+-----------+------------+------------+ ABI/TBIToday's ABIToday's TBIPrevious ABIPrevious TBI +-------+-----------+-----------+------------+------------+ Right  0.63       0.13       0.76        0.31         +-------+-----------+-----------+------------+------------+ Left   0.39       0          0.63        0            +-------+-----------+-----------+------------+------------+ Right ABIs appear decreased by 0.13 and Left ABIs appear decreased by 0.24 since previous exam on 08/30/21.  Summary: Right: Resting right ankle-brachial index indicates moderate right lower extremity arterial disease. The right toe-brachial index is abnormal. Left: Resting left ankle-brachial index indicates severe left lower extremity arterial disease. The left toe-brachial index is abnormal. *See table(s) above for measurements and observations. See arterial duplex report.  Electronically signed by Quay Burow MD on 08/30/2022 at  3:54:14 PM.    Final    VAS [place] LOWER EXTREMITY ARTERIAL DUPLEX  Result Date: 08/30/2022 LOWER EXTREMITY ARTERIAL DUPLEX STUDY Patient Name:  Colin Keller  Date of Exam:   08/30/2022 Medical Rec #: [id number]      Accession #:    [id number] Date of Birth: [birth year]      Patient Gender: M Patient Age:   71 years Exam Location:  [facility] Procedure:      VAS [place] LOWER EXTREMITY ARTERIAL DUPLEX Referring Phys: BRIAN CRENSHAW --------------------------------------------------------------------------------  Indications: Claudication, and peripheral artery disease. Patient presents for              follow-up on peripheral arterial disease. He reports increased              numbness in his left feet and toes as well as neuropathy symptoms.              He has bilateral calf claudication  symptoms after walking about 100              yards and has to stop and rest after walking or exerting himself.              He was supposed to start Pletal to help with these symptoms but              patient reports he was concerned to take it due to his heart              disease. Patient also reports he has back issues and is having an              MRI this Saturday to further evaluate. High Risk Factors: Hypertension, hyperlipidemia, Diabetes, past history of                    smoking, coronary artery disease. Other Factors: S/P CABG X 3.  Current ABI: Right-0.63              Left-0.39 Comparison Study: Previous arterial duplex performed 08/23/21 showed b/l CFA                   30-49% stenosis and bilateral 75-99% SFA stenosis. Performing Technologist: Mariane Masters RVT  Examination Guidelines: A complete evaluation includes B-mode imaging, spectral Doppler, color Doppler, and power Doppler as needed of all accessible portions of each vessel. Bilateral testing is considered an integral part of a complete examination. Limited examinations for reoccurring indications may be performed as noted.  +-----------+--------+-----+---------------+----------+------------------------+ RIGHT      PSV cm/sRatioStenosis       Waveform  Comments                 +-----------+--------+-----+---------------+----------+------------------------+ CFA Prox   195          30-49% stenosistriphasic                          +-----------+--------+-----+---------------+----------+------------------------+ CFA Distal 113                         triphasic                          +-----------+--------+-----+---------------+----------+------------------------+ DFA        258                         biphasic                           +-----------+--------+-----+---------------+----------+------------------------+  SFA Prox   155                         monophasicresistant                 +-----------+--------+-----+---------------+----------+------------------------+ SFA Mid    52                          monophasic                         +-----------+--------+-----+---------------+----------+------------------------+ SFA Distal 626     6.2  75-99% stenosisstenotic  severely elevated                                                         velocities, calcific                                                      plaque                   +-----------+--------+-----+---------------+----------+------------------------+ POP Prox   137                         monophasic                         +-----------+--------+-----+---------------+----------+------------------------+ POP Distal 70                          monophasic                         +-----------+--------+-----+---------------+----------+------------------------+ TP Trunk   62                          monophasic                         +-----------+--------+-----+---------------+----------+------------------------+ ATA Distal 50                          monophasic                         +-----------+--------+-----+---------------+----------+------------------------+ PTA Distal 52                          monophasic                         +-----------+--------+-----+---------------+----------+------------------------+ PERO Distal19                          monophasic                         +-----------+--------+-----+---------------+----------+------------------------+ A focal velocity elevation of 626 cm/s was obtained at distal SFA with post stenotic turbulence with a VR of 6.2. Findings are characteristic of  75-99% stenosis.  +-----------+--------+-----+---------------+----------+------------------------+ LEFT       PSV cm/sRatioStenosis       Waveform  Comments                 +-----------+--------+-----+---------------+----------+------------------------+ CFA  Prox   153          30-49% stenosisbiphasic                           +-----------+--------+-----+---------------+----------+------------------------+ CFA Distal 113                         monophasic                         +-----------+--------+-----+---------------+----------+------------------------+ DFA        530                         biphasic  elevated velocities      +-----------+--------+-----+---------------+----------+------------------------+ SFA Prox   151                         biphasic  bulky plaque             +-----------+--------+-----+---------------+----------+------------------------+ SFA Mid    40                          monophasic                         +-----------+--------+-----+---------------+----------+------------------------+ SFA Distal 509     9.3  75-99% stenosisstenotic  narrow flow channel,                                                      bulky plaque             +-----------+--------+-----+---------------+----------+------------------------+ POP Prox   33                          monophasiccannot rule out                                                           occlusion                +-----------+--------+-----+---------------+----------+------------------------+ POP Distal 58                          monophasic                         +-----------+--------+-----+---------------+----------+------------------------+ TP Trunk   52                          monophasic                         +-----------+--------+-----+---------------+----------+------------------------+ ATA Distal 35  monophasic                         +-----------+--------+-----+---------------+----------+------------------------+ PTA Distal 40                          monophasic                         +-----------+--------+-----+---------------+----------+------------------------+ PERO  Distal14                          monophasic                         +-----------+--------+-----+---------------+----------+------------------------+ A focal velocity elevation of 509 cm/s was obtained at distal SFA with post stenotic turbulence with a VR of 9.3. Findings are characteristic of 75-99% stenosis.  Summary: Right: 30-49% stenosis noted in the common femoral artery. 75-99% stenosis noted in the superficial femoral artery, high-end of range, based on PSV and velocity ratio. The right pedal acceleration time is 179 ms, which is category 2, indicating mild ischemia. Left: 30-49% stenosis noted in the common femoral artery. 75-99% stenosis noted in the superficial femoral artery, high-end of range, based on PSV and velocity ratio. The left pedal acceleration time is 221 ms, which is category 3, indicating moderate-severe ischemia.  See table(s) above for measurements and observations. See ABI report. Vascular consult recommended. Electronically signed by Quay Burow MD on 08/30/2022 at 3:53:48 PM.    Final    Disposition   Pt is being discharged home today in good condition.  Follow-up Plans & Appointments     Follow-up Information     [facility], [place] Follow up.   Specialties: Cardiology, Radiology Why: [facility] - [facility] location - follow-up scheduled on Thursday Oct 06, 2022 9:15 AM (Arrive by 9:00 AM). Isaac Laud is one of the PAs with our team. Contact information: [address] [place] [address] [phone]                Discharge Instructions     Diet - low sodium heart healthy   Complete by: As directed    Discharge instructions   Complete by: As directed    IMPORTANT: After a heart catheterization, you will need to avoid taking metformin for at least 48 hours. We would recommend you restart this the morning of 09/24/22.  You were started on a new medicine called clopidogrel (Plavix) to take IN ADDITION to your aspirin.  Your hydralazine  was increased to 3 times a day due to high blood pressure. It is preferred to space the each hydralazine doses out about every 8 hours as best as you can. Please monitor your blood pressure once a day, at least 3 hours after your morning medicines, and bring that log to your follow-up appointment. Call your doctor if you tend to get readings of greater than 130 on the top number or 80 on the bottom number.  We have stopped your cilostazol.   Increase activity slowly   Complete by: As directed    No driving for 2 days. No lifting over 5 lbs for 1 week. No sexual activity for 1 week. You may return to work in 1 week if you are feeling well. Keep procedure site clean & dry. If you notice increased pain, swelling, bleeding or pus, call/return!  You may shower,  but no soaking baths/hot tubs/pools for 1 week.        Discharge Medications   Allergies as of 09/22/2022       Reactions   Amlodipine Swelling   Leg swelling   Amitriptyline Other (See Comments)   Urinary retention   Nortriptyline Hcl Other (See Comments)   Vivid / bad dreams   Statins    Aches in Joints    Simvastatin Other (See Comments)   Arthralgias        Medication List     STOP taking these medications    cilostazol 50 MG tablet Commonly known as: PLETAL       TAKE these medications    aspirin 81 MG tablet Take 81 mg by mouth daily.   Basaglar KwikPen 100 UNIT/ML Inject 16-18 Units into the skin daily.   carvedilol 12.5 MG tablet Commonly known as: COREG Take 1 tablet (12.5 mg total) by mouth 2 (two) times daily with a meal.   clopidogrel 75 MG tablet Commonly known as: PLAVIX Take 1 tablet (75 mg total) by mouth daily.   Dexcom G6 Receiver Devi 1 Device by Does not apply route as directed. Use with Dexcom G6 sensor and transmitter   Dexcom G6 Sensor Misc Inject 1 applicator into the skin as directed. Change sensor every 10 days.   Dexcom G6 Transmitter Misc Inject 1 Device into the skin as  directed. Reuse 8 times with sensor changes.   famotidine 20 MG tablet Commonly known as: PEPCID Take 20 mg by mouth daily.   hydrALAZINE 50 MG tablet Commonly known as: APRESOLINE Take 1 tablet (50 mg total) by mouth 3 (three) times daily. What changed: when to take this   HYDROcodone-acetaminophen 10-325 MG tablet Commonly known as: NORCO Take 1 tablet by mouth 2 (two) times daily as needed for moderate pain.   insulin lispro 100 UNIT/ML injection Commonly known as: HUMALOG Inject 0.03-0.05 mLs (3-5 Units total) into the skin 2 (two) times daily with a meal. 3-4 units prior to breakfast, 4-5 units prior to evening meal What changed:  when to take this additional instructions   metFORMIN 500 MG tablet Commonly known as: GLUCOPHAGE TAKE 2 TABLETS BY MOUTH TWICE DAILY WITH A MEAL Notes to patient: IMPORTANT: After a heart catheterization, you will need to avoid taking metformin for at least 48 hours. We would recommend you restart this the morning of 09/24/22.    Praluent 150 MG/ML [place] Generic drug: Alirocumab INJECT 150 MG INTO THE SKIN EVERY 14 (FOURTEEN) DAYS.   tamsulosin 0.4 MG Caps capsule Commonly known as: FLOMAX TAKE 1 CAPSULE BY MOUTH DAILY   VITAMIN B 12 PO Take 1 tablet by mouth daily.         Declined to use TOC pharmacy so rx's sent to usual pharmacy.  Outstanding Labs/Studies   N/A  Duration of Discharge Encounter   Greater than 30 minutes including physician time.  Signed, Charlie Pitter, PA-C 09/22/2022, 11:47 AM

## 2022-09-22 NOTE — TOC Benefit Eligibility Note (Signed)
Patient Teacher, English as a foreign language completed.    The patient is currently admitted and upon discharge could be taking Farxiga 10 mg.  The current 30 day co-pay is $95.00.   The patient is currently admitted and upon discharge could be taking Jardiance 10 mg.  The current 30 day co-pay is $95.00.   The patient is insured through [employer], [employer] Patient Advocate Specialist [place] Patient Advocate Team Direct Number: [phone]  Fax: [phone]

## 2022-09-22 NOTE — Progress Notes (Signed)
Mobility Specialist Progress Note:   09/22/22 1052  Mobility  Activity Ambulated with assistance in hallway  Level of Assistance Modified independent, requires aide device or extra time  Assistive Device None  Distance Ambulated (ft) 450 ft  Activity Response Tolerated well  $Mobility charge 1 Mobility   Pt received in bed willing to participate in mobility. Complaints of pain in his feet. Left in bed with call bell in reach and all needs met.   [facility] Surveyor, mining Chat only

## 2022-09-22 NOTE — Progress Notes (Signed)
Patient given discharge instructions. PIV removed. Telemetry box removed, CCMD notified. Patient taken to vehicle in wheelchair by staff.  Berdena Cisek L Zahara Rembert, RN  

## 2022-09-22 NOTE — TOC Initial Note (Signed)
Transition of Care [facility]) - Initial/Assessment Note    Patient Details  Name: Colin Keller MRN: [id number] Date of Birth: 1956-08-08  Transition of Care [facility]) CM/SW Contact:    Verdell Carmine, RN Phone Number: 09/22/2022, 10:42 AM  Clinical Narrative:                      TOC transitions of care has re viwed the patient and fou nd no needs. Thvepatient will be discussed on daily progressione . If any needs identified pkease place a .C consull Patient Goals and CMS Choice        Expected Discharge Plan and Services                                                Prior Living Arrangements/Services                       Activities of Daily Living Home Assistive Devices/Equipment: Eyeglasses ADL Screening (condition at time of admission) Patient's cognitive ability adequate to safely complete daily activities?: Yes Is the patient deaf or have difficulty hearing?: No Does the patient have difficulty seeing, even when wearing glasses/contacts?: Yes Does the patient have difficulty concentrating, remembering, or making decisions?: No Patient able to express need for assistance with ADLs?: Yes Does the patient have difficulty dressing or bathing?: No Independently performs ADLs?: Yes (appropriate for developmental age) Does the patient have difficulty walking or climbing stairs?: No Weakness of Legs: None Weakness of Arms/Hands: None  Permission Sought/Granted                  Emotional Assessment              Admission diagnosis:  PAD (peripheral artery disease) ([place]) [I73.9] Patient Active Problem List   Diagnosis Date Noted   Spinal stenosis 09/22/2022   Normocytic anemia 09/22/2022   Proteinuria 06/22/2022   Paresthesia of left foot 05/13/2022   Tobacco dependence due to cigarettes 09/27/2021   Bilateral carotid artery disease ([place]) 08/31/2021   Aneurysm of right subclavian artery ([place]) 08/31/2021   Extremity atherosclerosis with  intermittent claudication ([place]) 08/31/2021   Encounter for long-term (current) use of insulin ([place]) 05/25/2021   BPH (benign prostatic hyperplasia) 08/27/2020   GERD without esophagitis 08/18/2020   Hypertensive nephropathy 08/18/2020   Left ventricular dysfunction 08/18/2020   Healthcare maintenance 05/08/2020   PAD (peripheral artery disease) ([place]) 11/23/2018   Heart failure with mid-range ejection fraction ([place])    Encounter for chronic pain management 02/16/2015   Diabetic retinopathy ([place]) 08/30/2013   CKD stage G3a/A3, GFR 45-59 and albumin creatinine ratio >300 mg/g ([place]) 03/05/2013   DM (diabetes mellitus), type 2 with peripheral vascular complications ([place]) 73/22/0254   Hypertension associated with diabetes ([place]) 12/22/2008   Hyperlipidemia associated with type 2 diabetes mellitus ([place]) 12/21/2008   Chronic low back pain without sciatica 10/16/2008   PCP:  McDiarmid, Blane Ohara, MD Pharmacy:   [facility], [place] [address] [place] [address] Phone: [phone] Fax: [address], [place] [address] Phone: [phone] Fax: [phone]  [id number] - [place], [place] - [address] [place] [address] Phone: [phone] Fax:  [place], [place] - [address] [place] [address] Phone: [phone] Fax: [phone]  [id number] - [place], [place]. [place] [address] Phone: [phone] Fax: [phone]     Social Determinants of Health (SDOH) Interventions    Readmission Risk Interventions     No data to display

## 2022-09-22 NOTE — Inpatient Diabetes Management (Addendum)
Inpatient Diabetes Program Recommendations  AACE/ADA: New Consensus Statement on Inpatient Glycemic Control (2015)  Target Ranges:  Prepandial:   less than 140 mg/dL      Peak postprandial:   less than 180 mg/dL (1-2 hours)      Critically ill patients:  140 - 180 mg/dL   Lab Results  Component Value Date   GLUCAP 134 (H) 09/21/2022   HGBA1C 9.5 (A) 05/12/2022    Review of Glycemic Control  Latest Reference Range & Units 09/21/22 16:28  Glucose-Capillary 70 - 99 mg/dL 134 (H)  (H): Data is abnormally high Diabetes history: Type 2 DM Outpatient Diabetes medications: Metformin 1000 mg BID, Basaglar 16-18 units QD, Humalog 3-5 units BID Current orders for Inpatient glycemic control: Semglee 16 units QD  Inpatient Diabetes Program Recommendations:    Consider adding A1C, Novolog 3 units TID (assuming patient is consuming >50% of meal) and Novolog 0-9 units TID & HS  Thanks, Bronson Curb, MSN, RNC-OB Diabetes Coordinator [phone] (8a-5p)

## 2022-09-22 NOTE — Telephone Encounter (Signed)
Pharmacy Patient Advocate Encounter  Insurance verification completed.    The patient is insured through [employer] Part D   The patient is currently admitted and ran test claims for the following: Farxiga, Jardiance.  Copays and coinsurance results were relayed to Inpatient clinical team. 

## 2022-09-22 NOTE — Progress Notes (Signed)
BP elevated. We managed with Hydralazine q 8 hrs and Labetalol PRN. Chronic pain on lower back and right shoulder scale 7-8/10. Tylenol and Norco given. Pain is well tolerated. Pt is able to rest. Right groin is negative for bleeding or hematoma. Good patent PT and DP pulses with doppler. We will monitor.  Kennyth Lose, RN

## 2022-09-22 NOTE — Progress Notes (Addendum)
Progress Note  Patient Name: Colin Keller Date of Encounter: 09/22/2022  Primary Cardiologist: Kirk Ruths, MD  Subjective   Feeling well. Left leg feels a little better this morning. He's hopeful the intervention will have made a difference. There's been some discussion of whether symptoms are related to PVD or spinal stenosis outlined below. No CP or SOB.   He does work - Education officer, community jobs - but does not need work note. Wants to use his usual pharmacy for any dc meds.  Inpatient Medications    Scheduled Meds:  aspirin  81 mg Oral Daily   carvedilol  12.5 mg Oral BID WC   clopidogrel  75 mg Oral Q breakfast   famotidine  20 mg Oral Daily   hydrALAZINE  50 mg Oral Q8H   insulin glargine-yfgn  16 Units Subcutaneous QHS   sodium chloride flush  3 mL Intravenous Q12H   tamsulosin  0.4 mg Oral Daily   Continuous Infusions:  sodium chloride     PRN Meds: sodium chloride, acetaminophen, HYDROcodone-acetaminophen, labetalol, ondansetron (ZOFRAN) IV, sodium chloride flush   Vital Signs    Vitals:   09/22/22 0310 09/22/22 0400 09/22/22 0559 09/22/22 0716  BP: (!) 163/80 (!) 178/78 (!) 157/67 (!) 156/65  Pulse: 76 80 71 77  Resp: 14 19 12 19   Temp: 98.5 F (36.9 C)   98.9 F (37.2 C)  TempSrc: Oral   Oral  SpO2: 97% 98% 98% 98%  Weight:      Height:        Intake/Output Summary (Last 24 hours) at 09/22/2022 0908 Last data filed at 09/22/2022 0723 Gross per 24 hour  Intake 2037.29 ml  Output 2200 ml  Net -162.71 ml      09/21/2022    7:49 AM 09/06/2022    8:22 AM 06/16/2022   10:06 AM  Last 3 Weights  Weight (lbs) 145 lb 145 lb 3.2 oz 144 lb  Weight (kg) 65.772 kg 65.862 kg 65.318 kg     Telemetry    NSR - Personally Reviewed  Physical Exam   GEN: No acute distress.  HEENT: Normocephalic, atraumatic, sclera non-icteric. Neck: No JVD or bruits. Cardiac: RRR no murmurs, rubs, or gallops.  Respiratory: Clear to auscultation bilaterally. Breathing is  unlabored. GI: Soft, nontender, non-distended, BS +x 4. MS: no deformity. Extremities: No clubbing or cyanosis. No edema. Distal pedal pulses in tact and equal bilaterally. He does have bilateral femoral bruits noted. Right groin cath site without hematoma, ecchymosis. Neuro:  AAOx3. Follows commands. Psych:  Responds to questions appropriately with a normal affect.  Labs    High Sensitivity Troponin:  No results for input(s): "TROPONINIHS" in the last 720 hours.    Cardiac EnzymesNo results for input(s): "TROPONINI" in the last 168 hours. No results for input(s): "TROPIPOC" in the last 168 hours.   Chemistry Recent Labs  Lab 09/22/22 0055  NA 136  K 4.1  CL 108  CO2 21*  GLUCOSE 331*  BUN 25*  CREATININE 1.40*  CALCIUM 8.6*  GFRNONAA 55*  ANIONGAP 7     Hematology Recent Labs  Lab 09/22/22 0055  WBC 8.5  RBC 2.94*  HGB 9.4*  HCT 28.4*  MCV 96.6  MCH 32.0  MCHC 33.1  RDW 13.2  PLT 200    BNPNo results for input(s): "BNP", "PROBNP" in the last 168 hours.   DDimer No results for input(s): "DDIMER" in the last 168 hours.   Radiology    PERIPHERAL VASCULAR  CATHETERIZATION  Result Date: 09/21/2022 1.  No significant aortoiliac disease. 2.  Left lower extremity: Severe heavily calcified disease affecting the proximal as well as distal SFA with three-vessel runoff below the knee.  The distal SFA is occluded. 3.  Successful orbital atherectomy and drug coated balloon angioplasty to the left SFA. Recommendations: Dual antiplatelet therapy for at least 6 months. Rest of treatment of risk factors. Hydrate overnight due to chronic kidney disease.  105 mL of contrast was used. Staged right lower extremity angiography can be considered    Cardiac Studies   PV Angio 09/21/22 1.  No significant aortoiliac disease. 2.  Left lower extremity: Severe heavily calcified disease affecting the proximal as well as distal SFA with three-vessel runoff below the knee.  The distal SFA  is occluded. 3.  Successful orbital atherectomy and drug coated balloon angioplasty to the left SFA.   Recommendations: Dual antiplatelet therapy for at least 6 months. Rest of treatment of risk factors. Hydrate overnight due to chronic kidney disease.  105 mL of contrast was used. Staged right lower extremity angiography can be considered  Patient Profile     66 y.o. male with CAD s/p NSTEMI/CABG in 2019, HLD intolerant to statins, DM2, CKD stage 3a, carotid artery disease, PAD, chronic combined CHF, HTN, tobacco abuse presented to the hospital for planned PV Angio. He was recently seen in the office by Dr. Fletcher Anon with increasing left leg claudication. He had recent noninvasive vascular studies which showed an ABI of 0.6 on the right and 0.39 on the left.Duplex on the right showed severe distal SFA stenosis.  On the left, there was severe stenosis in the profunda and distal SFA.  Assessment & Plan    1. PAD - underwent PV angio yesterday with procedure details above, s/p Successful orbital atherectomy and drug coated balloon angioplasty to the left SFA; to consider staged RLE angiography - recommended for DAPT for at least 6 months and otherwise treatment of risk factors - Plavix added to ASA - will d/w MD whether we are continuing Pletal as part of therapy, not reordered on admission, as well as presence of bilateral femoral bruits - no bruising or hematoma noted - has PV f/u 10/06/22  2. Essential HTN - current rx: carvedilol 12.5mg  BID, hydralazine 50mg  q8hr (increased from BID), tamsulosin 0.4mg  daily -> was elevated as OP, felt related to pain - remains elevated, will review dc dispo with MD  3. Hyperlipidemia - statin intolerant, on Praluent as OP, LDL 42  4. DM - continue home insulin regimen at discharge except hold metformin 48 hours post-angio; pt knows to contact pharmacy for refills from PCP  5. CKD stage 3a - creatinine appears at baseline post-intervention - per Dr.  Jacalyn Lefevre notes, follows with nephrology  6. Normocytic anemia - chronic appearing by labs, some decline post-procedurally from 10.9-9.4 suspect related to procedure/hydration - can consider recheck as OP  7. Spinal stenosis - outpatient lumbar MRI showed findings c/w spinal stenosis - per PCP note 10/5, "I conveyed to Mr Kessinger that I was uncertain how much his left leg pain, weakness and abnormal sensorimotor EMG/NCS is attributable to his spinal canal stenosis and how much attributable to other causes, in particular his left leg PAD and his diabetic neuropathy. He is scheduled to see Dr Leta Baptist (Neuro) 09/26/22" - further management per medicine  Remainder of cardiac issues felt stable this admission.  For questions or updates, please contact [place] Please consult [email or web address] for contact info  under Cardiology/STEMI.  Signed, Charlie Pitter, PA-C 09/22/2022, 9:08 AM

## 2022-09-26 ENCOUNTER — Telehealth: Payer: Self-pay | Admitting: Diagnostic Neuroimaging

## 2022-09-26 ENCOUNTER — Encounter: Payer: Self-pay | Admitting: Diagnostic Neuroimaging

## 2022-09-26 ENCOUNTER — Other Ambulatory Visit: Payer: Self-pay | Admitting: *Deleted

## 2022-09-26 ENCOUNTER — Ambulatory Visit: Payer: Medicare HMO | Admitting: Diagnostic Neuroimaging

## 2022-09-26 VITALS — BP 176/69 | HR 68 | Ht 71.0 in | Wt 149.5 lb

## 2022-09-26 DIAGNOSIS — M48062 Spinal stenosis, lumbar region with neurogenic claudication: Secondary | ICD-10-CM | POA: Diagnosis not present

## 2022-09-26 DIAGNOSIS — E1142 Type 2 diabetes mellitus with diabetic polyneuropathy: Secondary | ICD-10-CM | POA: Diagnosis not present

## 2022-09-26 DIAGNOSIS — I739 Peripheral vascular disease, unspecified: Secondary | ICD-10-CM

## 2022-09-26 NOTE — Progress Notes (Signed)
[facility]  PATIENT: Colin Keller DOB: 1956/11/13  REFERRING CLINICIAN: McDiarmid, Blane Ohara, MD HISTORY FROM: PATIENT REASON FOR VISIT: new consult   HISTORICAL  CHIEF COMPLAINT:  Chief Complaint  Patient presents with   New Patient (Initial Visit)    Pt states having back pain and left foot being numb, he has trouble with balancing and walking. He is struggling to walk. He states having no falls. Room 6 alone    HISTORY OF PRESENT ILLNESS:   66 year old male with left foot numbness.  Patient has longstanding diabetes and low back pain.  3 months ago noticed left foot numbness and left foot weakness.  Has had MRI of the lumbar spine which showed severe lumbar spinal stenosis.  Also has hemoglobin A1c of 9.6.  He is able to walk 5 to 10 minutes at a time but then has to stop because of severe pain in his legs and weakness.  Also has peripheral arterial disease.   REVIEW OF SYSTEMS: Full 14 system review of systems performed and negative with exception of: as per HPI.  ALLERGIES: Allergies  Allergen Reactions   Amlodipine Swelling    Leg swelling   Amitriptyline Other (See Comments)    Urinary retention   Nortriptyline Hcl Other (See Comments)    Vivid / bad dreams   Statins     Aches in Joints    Simvastatin Other (See Comments)    Arthralgias    HOME MEDICATIONS: Outpatient Medications Prior to Visit  Medication Sig Dispense Refill   aspirin 81 MG tablet Take 81 mg by mouth daily.     carvedilol (COREG) 12.5 MG tablet Take 1 tablet (12.5 mg total) by mouth 2 (two) times daily with a meal. 180 tablet 3   clopidogrel (PLAVIX) 75 MG tablet Take 1 tablet (75 mg total) by mouth daily. 90 tablet 1   Continuous Blood Gluc Receiver (DEXCOM G6 RECEIVER) DEVI 1 Device by Does not apply route as directed. Use with Dexcom G6 sensor and transmitter 1 each 1   Continuous Blood Gluc Sensor (DEXCOM G6 SENSOR) MISC Inject 1 applicator into the skin as directed.  Change sensor every 10 days. 3 each 11   Continuous Blood Gluc Transmit (DEXCOM G6 TRANSMITTER) MISC Inject 1 Device into the skin as directed. Reuse 8 times with sensor changes. 1 each 3   Cyanocobalamin (VITAMIN B 12 PO) Take 1 tablet by mouth daily.     famotidine (PEPCID) 20 MG tablet Take 20 mg by mouth daily.     hydrALAZINE (APRESOLINE) 50 MG tablet Take 1 tablet (50 mg total) by mouth 3 (three) times daily. 270 tablet 1   HYDROcodone-acetaminophen (NORCO) 10-325 MG tablet Take 1 tablet by mouth 2 (two) times daily as needed for moderate pain. 60 tablet 0   Insulin Glargine (BASAGLAR KWIKPEN) 100 UNIT/ML Inject 16-18 Units into the skin daily. 16.2 mL 1   insulin lispro (HUMALOG) 100 UNIT/ML injection Inject 0.03-0.05 mLs (3-5 Units total) into the skin 2 (two) times daily with a meal. 3-4 units prior to breakfast, 4-5 units prior to evening meal (Patient taking differently: Inject 3-5 Units into the skin See admin instructions. 3-4 units prior to breakfast, 4-5 units prior to evening meal as needed for high blood sugar)     metFORMIN (GLUCOPHAGE) 500 MG tablet TAKE 2 TABLETS BY MOUTH TWICE DAILY WITH A MEAL 180 tablet 3   PRALUENT 150 MG/ML SOAJ INJECT 150 MG INTO THE SKIN EVERY 14 (FOURTEEN)  DAYS. 6 mL 3   tamsulosin (FLOMAX) 0.4 MG CAPS capsule TAKE 1 CAPSULE BY MOUTH DAILY 90 capsule 3   No facility-administered medications prior to visit.    PAST MEDICAL HISTORY: Past Medical History:  Diagnosis Date   Acute diastolic heart failure ([place]) 08/18/2018   Atherosclerosis of both carotid arteries 08/18/2020   Carotid dopplers 08/2019 40-59% bilateral stenosis and right subclavian stenosis   Bilateral carotid artery stenosis 08/18/2020   Right Carotid: Velocities in the right ICA are consistent with a 60-79%                 stenosis. Non-hemodynamically significant plaque <50% noted in                 the CCA. The ECA appears >50% stenosed. Proximal subclavian                 artery dilatation  with elevated and turbulent flow just past the                 narrowing, measuring 1.6 cm.   Left Carotid: Velocities in the left ICA are    CHF (congestive heart failure) (HCC)    Chronic left shoulder pain 03/18/2021   Chronic low back pain without sciatica 10/16/2008   H/o chronic LBP with h/o lumbar disc herniation and intermittent radicular pain  Chronic pain, on stable doses of Norco 10/325 TID. MRI in 2003 showing [place] AT L4-5.  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      CKD (chronic kidney disease), stage III (HCC)    Coronary artery disease    Diabetes mellitus    Dyspnea    GERD (gastroesophageal reflux disease)    Hx of CABG 08/23/2018   LIMA to LAD, RIMA to RCA, SVG to OM3, [facility]H via right thigh   Hyperlipidemia    Hypertension    Hypertension associated with diabetes ([place]) 12/22/2008   Qualifier: Diagnosis of  By: Oneal Grout MD, Susan     Hypertensive nephropathy 08/18/2020   Insulin dependent type 2 diabetes mellitus ([place]) 12/22/2020   Peripheral artery disease ([place]) 08/31/2021   [facility] Vascular Study lower extremities 08/30/21:            Today's ABI  Today's TBI   Previous ABIPrevious TBI  +-------+-----------+-----------+------------+------------+  Right  0.76       0.31       1.03                      +-------+-----------+-----------+------------+------------+  Left   0.63       0.20       0.95                      +-------+-----------+---------   Proteinuria 08/18/2020   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   LIMA to LAD, RIMA to RCA, SVG to OM3, [facility] via right thigh   Sleeping difficulties 09/08/2019   Subclavian artery stenosis, right ([place]) 08/18/2020   Carotid dopplers 08/2019 40-59% bilateral stenosis and right subclavian stenosis   Tobacco abuse    Tobacco abuse disorder    Qualifier: Diagnosis of  By: Oneal Grout MD, Arville Go SURGICAL HISTORY: Past Surgical History:  Procedure Laterality Date   ABDOMINAL  AORTOGRAM W/LOWER EXTREMITY N/A 09/21/2022   Procedure: ABDOMINAL AORTOGRAM W/LOWER EXTREMITY;  Surgeon: Wellington Hampshire, MD;  Location: [facility];  Service: Cardiovascular;  Laterality: N/A;   CORONARY ARTERY BYPASS GRAFT N/A 08/23/2018   Procedure: CORONARY ARTERY BYPASS GRAFTING (CABG) x 3; -Left Internal Mammary Artery to Left Anterior Descending Artery, -Right Internal Mammary Artery to Right Coronary Artery, -Saphenous Vein Graft to Obtuse Marginal;  ENDOSCOPIC HARVEST GREATER SAPHENOUS VEIN  -Right Thigh;  Surgeon: Rexene Alberts, MD;  Location: [facility];  Service: Open Heart Surgery;  Laterality: N/A;   INTRAVASCULAR PRESSURE WIRE/FFR STUDY N/A 08/20/2018   Procedure: INTRAVASCULAR PRESSURE WIRE/FFR STUDY;  Surgeon: Sherren Mocha, MD;  Location: [facility];  Service: Cardiovascular;  Laterality: N/A;   LEFT HEART CATH AND CORONARY ANGIOGRAPHY N/A 08/20/2018   Procedure: LEFT HEART CATH AND CORONARY ANGIOGRAPHY;  Surgeon: Sherren Mocha, MD;  Location: [facility];  Service: Cardiovascular;  Laterality: N/A;   PERIPHERAL VASCULAR ATHERECTOMY Left 09/21/2022   Procedure: PERIPHERAL VASCULAR ATHERECTOMY;  Surgeon: Wellington Hampshire, MD;  Location: [facility];  Service: Cardiovascular;  Laterality: Left;  SFA   PERIPHERAL VASCULAR BALLOON ANGIOPLASTY Left 09/21/2022   Procedure: PERIPHERAL VASCULAR BALLOON ANGIOPLASTY;  Surgeon: Wellington Hampshire, MD;  Location: [facility];  Service: Cardiovascular;  Laterality: Left;  SFA   TEE WITHOUT CARDIOVERSION N/A 08/23/2018   Procedure: TRANSESOPHAGEAL ECHOCARDIOGRAM (TEE);  Surgeon: Rexene Alberts, MD;  Location: [facility];  Service: Open Heart Surgery;  Laterality: N/A;    FAMILY HISTORY: Family History  Problem Relation Age of Onset   Heart disease Mother    Diabetes Mother    Heart failure Mother    Heart disease Father    Sudden death Brother    Drug abuse Son     SOCIAL HISTORY: Social History    Socioeconomic History   Marital status: Married    Spouse name: Cecille Rubin    Number of children: 2   Years of education: 10   Highest education level: 10th grade  Occupational History   Occupation: Games developer  Tobacco Use   Smoking status: Every Day    Packs/day: 1.00    Years: 48.00    Total pack years: 48.00    Types: Cigarettes    Start date: 08/12/1970    Last attempt to quit: 08/12/2018    Years since quitting: 4.1   Smokeless tobacco: Never   Tobacco comments:    Quit post CABG in 2019 - previous 1ppd  Vaping Use   Vaping Use: Never used  Substance and Sexual Activity   Alcohol use: No    Alcohol/week: 0.0 standard drinks of alcohol   Drug use: No   Sexual activity: Yes    Birth control/protection: Post-menopausal  Other Topics Concern   Not on file  Social History Narrative   Lives with his wife in [place].    Wife Cecille Rubin is also [place] patient.   Works odd-jobs in Architect.    Previously used marijuana -quit 10/03. Previous 1ppd smoker- quit 2019.   Surrogate decision maker: Sepehr Plyler (wife)   Patient has one daughter.   Patients son died of a drug overdose 2021-03-10.   Patient has 2 dogs and 2 cats.       Social Determinants of Health   Financial Resource Strain: Low Risk  (04/21/2021)   Overall Financial Resource Strain (CARDIA)    Difficulty of Paying Living Expenses: Not hard at all  Food Insecurity: No Food Insecurity (09/21/2022)   Hunger Vital Sign    Worried About Running Out of Food in the Last Year: Never  true    Ran Out of Food in the Last Year: Never true  Transportation Needs: No Transportation Needs (09/21/2022)   PRAPARE - Hydrologist (Medical): No    Lack of Transportation (Non-Medical): No  Physical Activity: Sufficiently Active (04/21/2021)   Exercise Vital Sign    Days of Exercise per Week: 5 days    Minutes of Exercise per Session: 30 min  Stress: No Stress Concern Present (04/21/2021)   [place]    Feeling of Stress : Not at all  Social Connections: Moderately Isolated (04/21/2021)   Social Connection and Isolation Panel [NHANES]    Frequency of Communication with Friends and Family: More than three times a week    Frequency of Social Gatherings with Friends and Family: More than three times a week    Attends Religious Services: Never    Marine scientist or Organizations: No    Attends Archivist Meetings: Never    Marital Status: Married  Human resources officer Violence: Not At Risk (09/21/2022)   Humiliation, Afraid, Rape, and Kick questionnaire    Fear of Current or Ex-Partner: No    Emotionally Abused: No    Physically Abused: No    Sexually Abused: No     PHYSICAL EXAM  GENERAL EXAM/CONSTITUTIONAL: Vitals:  Vitals:   09/26/22 1046  BP: (!) 176/69  Pulse: 68  Weight: 149 lb 8 oz (67.8 kg)  Height: 5\' 11"  (1.803 m)   Body mass index is 20.85 kg/m. Wt Readings from Last 3 Encounters:  09/26/22 149 lb 8 oz (67.8 kg)  09/21/22 145 lb (65.8 kg)  09/06/22 145 lb 3.2 oz (65.9 kg)   Patient is in no distress; well developed, nourished and groomed; neck is supple  CARDIOVASCULAR: Examination of carotid arteries is normal; no carotid bruits Regular rate and rhythm, no murmurs Examination of peripheral vascular system by observation and palpation is normal  EYES: Ophthalmoscopic exam of optic discs and posterior segments is normal; no papilledema or hemorrhages No results found.  MUSCULOSKELETAL: Gait, strength, tone, movements noted in Neurologic exam below  NEUROLOGIC: MENTAL STATUS:      No data to display         awake, alert, oriented to person, place and time recent and remote memory intact normal attention and concentration language fluent, comprehension intact, naming intact fund of knowledge appropriate  CRANIAL NERVE:  2nd - no papilledema on fundoscopic exam 2nd, 3rd, 4th,  6th - pupils equal and reactive to light, visual fields full to confrontation, extraocular muscles intact, no nystagmus 5th - facial sensation symmetric 7th - facial strength symmetric 8th - hearing intact 9th - palate elevates symmetrically, uvula midline 11th - shoulder shrug symmetric 12th - tongue protrusion midline  MOTOR:  normal bulk and tone, full strength in the BUE, BLE; EXCEPT BILATERAL FOOT DORSIFLEXION 4/5  SENSORY:  normal and symmetric to light touch, temperature, vibration; REDUCED IN FEET / ANKLES  COORDINATION:  finger-nose-finger, fine finger movements normal  REFLEXES:  deep tendon reflexes TRACE and symmetric  GAIT/STATION:  ANTAGLIC GAIT     DIAGNOSTIC DATA (LABS, IMAGING, TESTING) - I reviewed patient records, labs, notes, testing and imaging myself where available.  Lab Results  Component Value Date   WBC 8.5 09/22/2022   HGB 9.4 (L) 09/22/2022   HCT 28.4 (L) 09/22/2022   MCV 96.6 09/22/2022   PLT 200 09/22/2022      Component  Value Date/Time   NA 136 09/22/2022 0055   NA 137 09/06/2022 0909   K 4.1 09/22/2022 0055   CL 108 09/22/2022 0055   CO2 21 (L) 09/22/2022 0055   GLUCOSE 331 (H) 09/22/2022 0055   BUN 25 (H) 09/22/2022 0055   BUN 31 (H) 09/06/2022 0909   CREATININE 1.40 (H) 09/22/2022 0055   CREATININE 1.57 (H) 07/14/2016 0847   CALCIUM 8.6 (L) 09/22/2022 0055   PROT 6.8 08/03/2021 0850   ALBUMIN 4.2 05/12/2022 1110   AST 12 08/03/2021 0850   ALT 10 08/03/2021 0850   ALKPHOS 57 08/03/2021 0850   BILITOT 0.4 08/03/2021 0850   GFRNONAA 55 (L) 09/22/2022 0055   GFRNONAA 62 08/12/2015 0943   GFRAA 55 (L) 12/10/2020 1643   GFRAA 71 08/12/2015 0943   Lab Results  Component Value Date   CHOL 118 09/22/2022   HDL 59 09/22/2022   LDLCALC 42 09/22/2022   LDLDIRECT 105 09/19/2016   TRIG 84 09/22/2022   CHOLHDL 2.0 09/22/2022   Lab Results  Component Value Date   HGBA1C 9.6 (H) 09/22/2022   Lab Results  Component Value Date    VITAMINB12 195 08/20/2018   Lab Results  Component Value Date   TSH 0.333 (L) 08/18/2018    07/28/22 EMG/NCS - severe sensory-motor polyneuropathy  09/03/22 MRI lumbar spine [I reviewed images myself and agree with interpretation. -VRP]  1. Severe spinal canal stenosis at L2-L3 due to large left asymmetric disc bulge with superimposed right subarticular extrusion. 2. Moderate spinal canal stenosis and moderate right neural foraminal stenosis at L3-L4. 3. Moderate spinal canal stenosis and moderate left neural foraminal stenosis at L4-L5. 4. A 5 mm synovial cyst projecting from the anterior aspect of the right L5-S1 facet.   ASSESSMENT AND PLAN  66 y.o. year old male here with:   Dx:  1. Spinal stenosis of lumbar region with neurogenic claudication   2. Diabetic polyneuropathy associated with type 2 diabetes mellitus ([place])     PLAN:  LUMBAR SPINAL STENOSIS (L2-3, L3-4, L4-5) - recommend neurosurgery consult - consider PT evaluation; consider cane / walker  SEVERE DIABETIC POLYNEUROPATHY (A1c 9.6; since ~2010) - continue diabetes control; left foot pain; continue   Orders Placed This Encounter  Procedures   Ambulatory referral to Neurosurgery   Return for pending if symptoms worsen or fail to improve, pending test results.    Penni Bombard, MD [id number], [id number] Certified in Neurology, Neurophysiology and Neuroimaging  [facility] [address] [place], [place] [address] [phone]

## 2022-09-26 NOTE — Patient Instructions (Signed)
  LUMBAR SPINAL STENOSIS (L2-3, L3-4, L4-5) - recommend neurosurgery consult - consider PT evaluation; consider cane / walker  SEVERE DIABETIC POLYNEUROPATHY (A1c 9.6; since ~2010) - continue diabetes control; left foot pain; continue

## 2022-09-26 NOTE — Telephone Encounter (Signed)
Referral to Neurosurgery faxed to [facility]. Phone: [phone], Fax: [phone]

## 2022-09-27 ENCOUNTER — Other Ambulatory Visit: Payer: Self-pay

## 2022-09-27 DIAGNOSIS — M545 Low back pain, unspecified: Secondary | ICD-10-CM

## 2022-09-27 DIAGNOSIS — M48062 Spinal stenosis, lumbar region with neurogenic claudication: Secondary | ICD-10-CM

## 2022-09-27 DIAGNOSIS — G8929 Other chronic pain: Secondary | ICD-10-CM

## 2022-09-28 ENCOUNTER — Telehealth: Payer: Self-pay | Admitting: Cardiovascular Disease

## 2022-09-28 ENCOUNTER — Other Ambulatory Visit: Payer: Self-pay | Admitting: Family Medicine

## 2022-09-28 MED ORDER — HYDROCODONE-ACETAMINOPHEN 5-325 MG PO TABS: 2.0000 | ORAL_TABLET | Freq: Two times a day (BID) | ORAL | 0 refills | Status: DC | PRN

## 2022-09-28 NOTE — Telephone Encounter (Signed)
Stopped HC-APAP 10 mg/325 #60 per month to HC-APAP 5mg /325 #120 per month due to national HC 10 mg tab shortage

## 2022-09-28 NOTE — Telephone Encounter (Signed)
Patient's wife returns call to nurse line. She reports that she has called several pharmacy and none have any stock due to nationwide back order.   Pharmacist suggested changing prescription to hydrocodone 5 mg/325 and updating quantity to 120.   [facility] [place] and [facility] this dosage in stock.   Will forward to PCP.   Talbot Grumbling, RN

## 2022-09-28 NOTE — Telephone Encounter (Signed)
Patient's wife calls nurse line. [facility] is out of stock of hydrocodone-acetaminophen.   [facility] [address] [facility] [place] Neither location has stock. Wife will call to other pharmacies in area. She will call back when she finds pharmacy with med in stock.   Talbot Grumbling, RN

## 2022-09-28 NOTE — Telephone Encounter (Signed)
Returned the call to the patient. He stated that he has been having left foot swelling post procedure. He stated that this was a problem prior to the procedure. The swelling does go down somewhat overnight. He denies any discoloration but did state that the foot is still numb like it was prior to the procedure although he said it he has gotten some minor feeling back.  Before the procedure the foot was cold to the touch. He stated that now it is a normal temperature. The foot is not painful expect at night he does have a burning sensation. He takes pain medications which relieves this pain.  He wants to know if soaking his foot in warm epsom salt water with help or if this will get better in time.   Follow up with Almyra Deforest 10/26 Dopplers 10/31

## 2022-09-28 NOTE — Telephone Encounter (Signed)
Swelling is expected after revascularization and should gradually improve.  I agree that the numbness might be related to diabetic neuropathy.  He does not have to do anything about this for now and his symptoms might just gradually improve.  We will see how the Doppler looks when he comes back.

## 2022-09-28 NOTE — Telephone Encounter (Signed)
  Pt requesting to get a call back from Dr. Tyrell Antonio nurse regarding his recent procedure. He said he have some questions

## 2022-09-28 NOTE — Telephone Encounter (Signed)
The patient has been made aware and verbalized understanding.

## 2022-10-04 ENCOUNTER — Telehealth: Payer: Self-pay

## 2022-10-04 NOTE — Telephone Encounter (Signed)
Patient's wife calls nurse line regarding CGM sensors. She reports that she has placed an order through [facility] ([facility]viously [facility]), however, it will take up to 14 business days to receive.   She states that patient placed last sensor a few days ago. She is requesting samples of sensors if possible.   She reports that he is using freestyle [place] 2 sensors.   Will forward to pharmacy team.   Talbot Grumbling, RN

## 2022-10-05 NOTE — Telephone Encounter (Signed)
Contacted patient RE [employer] 2 sensor supply.   Shared that I would be able to help him with supply.  He stated he would pick up tomorrow 10/26 when he had a follow-up Cardiology visit with Dr. Stanford Breed.  I have labeled two sensors and placed them in my office for him to have on 10/26  He reports he is still smoking and remains interested in quitting long-term.  He thanked me for remaining positive and supporting his cessation efforts.

## 2022-10-06 ENCOUNTER — Encounter: Payer: Self-pay | Admitting: Physician Assistant

## 2022-10-06 ENCOUNTER — Ambulatory Visit: Payer: Medicare HMO | Attending: Physician Assistant | Admitting: Physician Assistant

## 2022-10-06 VITALS — BP 160/66 | HR 73 | Ht 71.0 in | Wt 147.6 lb

## 2022-10-06 DIAGNOSIS — I739 Peripheral vascular disease, unspecified: Secondary | ICD-10-CM

## 2022-10-06 DIAGNOSIS — I10 Essential (primary) hypertension: Secondary | ICD-10-CM | POA: Diagnosis not present

## 2022-10-06 DIAGNOSIS — E785 Hyperlipidemia, unspecified: Secondary | ICD-10-CM | POA: Diagnosis not present

## 2022-10-06 DIAGNOSIS — I25810 Atherosclerosis of coronary artery bypass graft(s) without angina pectoris: Secondary | ICD-10-CM | POA: Diagnosis not present

## 2022-10-06 DIAGNOSIS — D649 Anemia, unspecified: Secondary | ICD-10-CM

## 2022-10-06 DIAGNOSIS — E119 Type 2 diabetes mellitus without complications: Secondary | ICD-10-CM | POA: Diagnosis not present

## 2022-10-06 DIAGNOSIS — I6523 Occlusion and stenosis of bilateral carotid arteries: Secondary | ICD-10-CM

## 2022-10-06 NOTE — Patient Instructions (Addendum)
Medication Instructions:  Your physician recommends that you continue on your current medications as directed. Please refer to the Current Medication list given to you today.  *If you need a refill on your cardiac medications before your next appointment, please call your pharmacy*  Lab Work: Your physician recommends that you return for lab work TODAY:  CBC  If you have labs (blood work) drawn today and your tests are completely normal, you will receive your results only by: MyChart Message (if you have MyChart) OR A paper copy in the mail If you have any lab test that is abnormal or we need to change your treatment, we will call you to review the results.  Testing/Procedures:  Almyra Deforest, PA-C has requested that you have a carotid duplex. This test is an ultrasound of the carotid arteries in your neck. It looks at blood flow through these arteries that supply the brain with blood. Allow one hour for this exam. There are no restrictions or special instructions.  Please schedule for March of 2024   Follow-Up: At [facility], you and your health needs are our priority.  As part of our continuing mission to provide you with exceptional heart care, we have created designated Provider Care Teams.  These Care Teams include your primary Cardiologist (physician) and Advanced Practice Providers (APPs -  Physician Assistants and Nurse Practitioners) who all work together to provide you with the care you need, when you need it.   Your next appointment:   February 2024  April 2024 The format for your next appointment:   In Person In Person   Provider:   Kirk Ruths, MD    Dr. Fletcher Anon  Other Instructions Monitor blood pressure at home for 1 week. If the top number is consistently 140 or greater increase Coreg (Carvedilol) to 25 mg 2 times a day and give our office a call.   Important Information About Sugar

## 2022-10-06 NOTE — Telephone Encounter (Signed)
Sensors picked up.

## 2022-10-06 NOTE — Progress Notes (Signed)
Cardiology Office Note:    Date:  10/08/2022   ID:  Colin Keller, DOB February 03, 1956, MRN [id number]  PCP:  McDiarmid, Blane Ohara, MD   [facility] Cardiologist:  Kirk Ruths, MD     Referring MD: McDiarmid, Blane Ohara, MD   Chief Complaint  Patient presents with   Follow-up    Seen for Dr. Stanford Breed    History of Present Illness:    Colin Keller is a 66 y.o. male with a hx of CAD s/p CABG, PAD, hypertension, hyperlipidemia, DM2, tobacco abuse, right subclavian artery stenosis and carotid artery disease.  Patient had NSTEMI in 2019.  Cardiac catheterization that time showed three-vessel disease, he subsequently underwent CABG.  He has intolerance of statins.  Screening for AAA in 2020 was negative, moderately elevated velocity was noted in the iliac arteries.  He was seen in 2022 for bilateral calf claudication.  ABI obtained in September 2022 showed left ABI 0.63, right ABI 0.76.  Right duplex showed severe stenosis in distal SFA, left leg duplex showed severe distal disease in the profunda as well as the distal SFA.  Most recent ABI shows left ABI dropped down to 0.39, right ABI 0.6.  Patient was last seen by Dr. Fletcher Anon at which time he complained of increasing claudication symptoms on the left side.  He eventually underwent a lower extremity angiography on 09/21/2022, this revealed severe heavily calcified proximal as well as distal SFA on the left side with three-vessel runoff, distal SFA was occluded, patient underwent successful orbital atherectomy and drug-coated balloon angioplasty to the left SFA.  Postprocedure, he was placed on dual antiplatelet therapy for 6 months.  If he has symptom in the future, staged right lower extremity angiography can be considered as well.  Pletal was discontinued postprocedure.  He does have significant spinal stenosis confirmed on lumbar MRI.  Patient presents today for post PV follow-up.  He mentions his left lower extremity claudication  symptom has significantly improved.  He still have numbness in his foot, however this is likely related to diabetic neuropathy.  At this time, he does not have any claudication symptoms he has right lower extremity, he is aware to let us know if he begins to have right lower extremity claudication symptom, if he does, then we will consider an right lower extremity angiography in the future.  He is due for carotid Doppler in March.  I have recommended a CBC today to make sure his anemia has not gotten worse.  He is aware that he need to continue on the Plavix for minimum of 6 months, I will defer to Dr. Fletcher Anon to consider take him off of Plavix after 6 months.  He can follow-up with Dr. Stanford Breed again in February and Dr. Fletcher Anon in April.  Pressure remain elevated in the 160s.  I did a manual recheck and his blood pressure was 160/66.  He says his blood pressure is lower at home.  I asked him to monitor his blood blood pressure at home, if systolic blood pressure remain greater than 140 mmHg after 1 week, he has been instructed to increase his carvedilol to 25 mg twice a day.   Past Medical History:  Diagnosis Date   Acute diastolic heart failure ([place]) 08/18/2018   Atherosclerosis of both carotid arteries 08/18/2020   Carotid dopplers 08/2019 40-59% bilateral stenosis and right subclavian stenosis   Bilateral carotid artery stenosis 08/18/2020   Right Carotid: Velocities in the right ICA are consistent  with a 60-79%                 stenosis. Non-hemodynamically significant plaque <50% noted in                 the CCA. The ECA appears >50% stenosed. Proximal subclavian                 artery dilatation with elevated and turbulent flow just past the                 narrowing, measuring 1.6 cm.   Left Carotid: Velocities in the left ICA are    CHF (congestive heart failure) (HCC)    Chronic left shoulder pain 03/18/2021   Chronic low back pain without sciatica 10/16/2008   H/o chronic LBP with h/o lumbar disc herniation  and intermittent radicular pain  Chronic pain, on stable doses of Norco 10/325 TID. MRI in 2003 showing [place] AT L4-5.  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      CKD (chronic kidney disease), stage III (HCC)    Coronary artery disease    Diabetes mellitus    Dyspnea    GERD (gastroesophageal reflux disease)    Hx of CABG 08/23/2018   LIMA to LAD, RIMA to RCA, SVG to OM3, EVH via right thigh   Hyperlipidemia    Hypertension    Hypertension associated with diabetes ([place]) 12/22/2008   Qualifier: Diagnosis of  By: Oneal Grout MD, Susan     Hypertensive nephropathy 08/18/2020   Insulin dependent type 2 diabetes mellitus ([place]) 12/22/2020   Peripheral artery disease ([place]) 08/31/2021   [facility] Study lower extremities 08/30/21:            Today's ABI  Today's TBI   Previous ABIPrevious TBI  +-------+-----------+-----------+------------+------------+  Right  0.76       0.31       1.03                      +-------+-----------+-----------+------------+------------+  Left   0.63       0.20       0.95                      +-------+-----------+---------   Proteinuria 08/18/2020   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   LIMA to LAD, RIMA to RCA, SVG to OM3, [facility] via right thigh   Sleeping difficulties 09/08/2019   Subclavian artery stenosis, right ([place]) 08/18/2020   Carotid dopplers 08/2019 40-59% bilateral stenosis and right subclavian stenosis   Tobacco abuse    Tobacco abuse disorder    Qualifier: Diagnosis of  By: Oneal Grout MD, Manuela Schwartz      Past Surgical History:  Procedure Laterality Date   ABDOMINAL AORTOGRAM W/LOWER EXTREMITY N/A 09/21/2022   Procedure: ABDOMINAL AORTOGRAM W/LOWER EXTREMITY;  Surgeon: Wellington Hampshire, MD;  Location: [facility];  Service: Cardiovascular;  Laterality: N/A;   CORONARY ARTERY BYPASS GRAFT N/A 08/23/2018   Procedure: CORONARY ARTERY BYPASS GRAFTING (CABG) x 3; -Left Internal Mammary Artery to Left  Anterior Descending Artery, -Right Internal Mammary Artery to Right Coronary Artery, -Saphenous Vein Graft to Obtuse Marginal;  ENDOSCOPIC HARVEST GREATER SAPHENOUS VEIN  -Right Thigh;  Surgeon: Rexene Alberts, MD;  Location: [facility];  Service: Open Heart Surgery;  Laterality: N/A;   INTRAVASCULAR PRESSURE WIRE/FFR STUDY N/A 08/20/2018   Procedure: INTRAVASCULAR PRESSURE WIRE/FFR STUDY;  Surgeon:  Sherren Mocha, MD;  Location: [facility];  Service: Cardiovascular;  Laterality: N/A;   LEFT HEART CATH AND CORONARY ANGIOGRAPHY N/A 08/20/2018   Procedure: LEFT HEART CATH AND CORONARY ANGIOGRAPHY;  Surgeon: Sherren Mocha, MD;  Location: [facility];  Service: Cardiovascular;  Laterality: N/A;   PERIPHERAL VASCULAR ATHERECTOMY Left 09/21/2022   Procedure: PERIPHERAL VASCULAR ATHERECTOMY;  Surgeon: Wellington Hampshire, MD;  Location: [facility];  Service: Cardiovascular;  Laterality: Left;  SFA   PERIPHERAL VASCULAR BALLOON ANGIOPLASTY Left 09/21/2022   Procedure: PERIPHERAL VASCULAR BALLOON ANGIOPLASTY;  Surgeon: Wellington Hampshire, MD;  Location: [facility];  Service: Cardiovascular;  Laterality: Left;  SFA   TEE WITHOUT CARDIOVERSION N/A 08/23/2018   Procedure: TRANSESOPHAGEAL ECHOCARDIOGRAM (TEE);  Surgeon: Rexene Alberts, MD;  Location: [facility];  Service: Open Heart Surgery;  Laterality: N/A;    Current Medications: Current Meds  Medication Sig   aspirin 81 MG tablet Take 81 mg by mouth daily.   carvedilol (COREG) 12.5 MG tablet Take 1 tablet (12.5 mg total) by mouth 2 (two) times daily with a meal.   clopidogrel (PLAVIX) 75 MG tablet Take 1 tablet (75 mg total) by mouth daily.   Continuous Blood Gluc Sensor (FREESTYLE LIBRE 2 SENSOR) MISC 1 Device by Does not apply route every 14 (fourteen) days.   Cyanocobalamin (VITAMIN B 12 PO) Take 1 tablet by mouth daily.   famotidine (PEPCID) 20 MG tablet Take 20 mg by mouth daily.   hydrALAZINE (APRESOLINE) 50 MG tablet Take 1  tablet (50 mg total) by mouth 3 (three) times daily.   HYDROcodone-acetaminophen (NORCO/VICODIN) 5-325 MG tablet Take 2 tablets by mouth 2 (two) times daily as needed for moderate pain.   Insulin Glargine (BASAGLAR KWIKPEN) 100 UNIT/ML Inject 16-18 Units into the skin daily.   insulin lispro (HUMALOG) 100 UNIT/ML injection Inject 0.03-0.05 mLs (3-5 Units total) into the skin 2 (two) times daily with a meal. 3-4 units prior to breakfast, 4-5 units prior to evening meal (Patient taking differently: Inject 3-5 Units into the skin See admin instructions. 3-4 units prior to breakfast, 4-5 units prior to evening meal as needed for high blood sugar)   metFORMIN (GLUCOPHAGE) 500 MG tablet TAKE 2 TABLETS BY MOUTH TWICE DAILY WITH A MEAL   PRALUENT 150 MG/ML SOAJ INJECT 150 MG INTO THE SKIN EVERY 14 (FOURTEEN) DAYS.   tamsulosin (FLOMAX) 0.4 MG CAPS capsule TAKE 1 CAPSULE BY MOUTH DAILY   [DISCONTINUED] Continuous Blood Gluc Receiver (DEXCOM G6 RECEIVER) DEVI 1 Device by Does not apply route as directed. Use with Dexcom G6 sensor and transmitter   [DISCONTINUED] Continuous Blood Gluc Sensor (DEXCOM G6 SENSOR) MISC Inject 1 applicator into the skin as directed. Change sensor every 10 days.   [DISCONTINUED] Continuous Blood Gluc Transmit (DEXCOM G6 TRANSMITTER) MISC Inject 1 Device into the skin as directed. Reuse 8 times with sensor changes.     Allergies:   Amlodipine, Amitriptyline, Nortriptyline hcl, Statins, and Simvastatin   Social History   Socioeconomic History   Marital status: Married    Spouse name: Cecille Rubin    Number of children: 2   Years of education: 10   Highest education level: 10th grade  Occupational History   Occupation: Games developer  Tobacco Use   Smoking status: Every Day    Packs/day: 1.00    Years: 48.00    Total pack years: 48.00    Types: Cigarettes    Start date: 08/12/1970  Last attempt to quit: 08/12/2018    Years since quitting: 4.1   Smokeless tobacco: Never   Tobacco  comments:    Quit post CABG in 2019 - previous 1ppd  Vaping Use   Vaping Use: Never used  Substance and Sexual Activity   Alcohol use: No    Alcohol/week: 0.0 standard drinks of alcohol   Drug use: No   Sexual activity: Yes    Birth control/protection: Post-menopausal  Other Topics Concern   Not on file  Social History Narrative   Lives with his wife in [place].    Wife Cecille Rubin is also [facility] patient.   Works odd-jobs in [employer].    Previously used marijuana -quit 10/03. Previous 1ppd smoker- quit 2019.   Surrogate decision maker: Kabeer Haw (wife)   Patient has one daughter.   Patients son died of a drug overdose 2021/03/12.   Patient has 2 dogs and 2 cats.       Social Determinants of Health   Financial Resource Strain: Low Risk  (04/21/2021)   Overall Financial Resource Strain (CARDIA)    Difficulty of Paying Living Expenses: Not hard at all  Food Insecurity: No Food Insecurity (09/21/2022)   Hunger Vital Sign    Worried About Running Out of Food in the Last Year: Never true    Ran Out of Food in the Last Year: Never true  Transportation Needs: No Transportation Needs (09/21/2022)   PRAPARE - Hydrologist (Medical): No    Lack of Transportation (Non-Medical): No  Physical Activity: Sufficiently Active (04/21/2021)   Exercise Vital Sign    Days of Exercise per Week: 5 days    Minutes of Exercise per Session: 30 min  Stress: No Stress Concern Present (04/21/2021)   [place]    Feeling of Stress : Not at all  Social Connections: Moderately Isolated (04/21/2021)   Social Connection and Isolation Panel [NHANES]    Frequency of Communication with Friends and Family: More than three times a week    Frequency of Social Gatherings with Friends and Family: More than three times a week    Attends Religious Services: Never    Marine scientist or Organizations: No    Attends Programme researcher, broadcasting/film/video: Never    Marital Status: Married     Family History: The patient's family history includes Diabetes in his mother; Drug abuse in his son; Heart disease in his father and mother; Heart failure in his mother; Sudden death in his brother.  ROS:   Please see the history of present illness.     All other systems reviewed and are negative.  EKGs/Labs/Other Studies Reviewed:    The following studies were reviewed today:  PV procedure 09/21/2022 1.  No significant aortoiliac disease. 2.  Left lower extremity: Severe heavily calcified disease affecting the proximal as well as distal SFA with three-vessel runoff below the knee.  The distal SFA is occluded. 3.  Successful orbital atherectomy and drug coated balloon angioplasty to the left SFA.   Recommendations: Dual antiplatelet therapy for at least 6 months. Rest of treatment of risk factors. Hydrate overnight due to chronic kidney disease.  105 mL of contrast was used. Staged right lower extremity angiography can be considered  EKG:  EKG is not ordered today.    Recent Labs: 09/22/2022: BUN 25; Creatinine, Ser 1.40; Potassium 4.1; Sodium 136 10/06/2022: Hemoglobin 9.9; Platelets 288  Recent Lipid Panel  Component Value Date/Time   CHOL 118 09/22/2022 0055   CHOL 125 08/03/2021 0850   TRIG 84 09/22/2022 0055   HDL 59 09/22/2022 0055   HDL 64 08/03/2021 0850   CHOLHDL 2.0 09/22/2022 0055   VLDL 17 09/22/2022 0055   LDLCALC 42 09/22/2022 0055   LDLCALC 41 08/03/2021 0850   LDLDIRECT 105 09/19/2016 0922     Risk Assessment/Calculations:           Physical Exam:    VS:  BP (!) 160/66   Pulse 73   Ht 5\' 11"  (1.803 m)   Wt 147 lb 9.6 oz (67 kg)   SpO2 99%   BMI 20.59 kg/m        Wt Readings from Last 3 Encounters:  10/06/22 147 lb 9.6 oz (67 kg)  09/26/22 149 lb 8 oz (67.8 kg)  09/21/22 145 lb (65.8 kg)     GEN:  Well nourished, well developed in no acute distress HEENT: Normal NECK:  No JVD; No carotid bruits LYMPHATICS: No lymphadenopathy CARDIAC: RRR, no murmurs, rubs, gallops RESPIRATORY:  Clear to auscultation without rales, wheezing or rhonchi  ABDOMEN: Soft, non-tender, non-distended MUSCULOSKELETAL:  No edema; No deformity  SKIN: Warm and dry NEUROLOGIC:  Alert and oriented x 3 PSYCHIATRIC:  Normal affect   ASSESSMENT:    1. PAD (peripheral artery disease) ([place])   2. Anemia, unspecified type   3. Bilateral carotid artery stenosis   4. Coronary artery disease involving coronary bypass graft of native heart without angina pectoris   5. Essential hypertension   6. Hyperlipidemia LDL goal <70   7. Controlled type 2 diabetes mellitus without complication, without long-term current use of insulin (HCC)    PLAN:    In order of problems listed above:  PAD: Recently underwent lower extremity angiography on 09/21/2022, this revealed a severe heavily calcified proximal as well as distal SFA disease on the left side with three-vessel runoff, distal SFA was also occluded.  The patient underwent successful orbital arthrectomy and drug-coated balloon angioplasty of the left SFA.  Postprocedure.  Patient was placed on aspirin and Plavix for minimum of 6 months.  His left lower extremity claudication symptom has significantly improved.  Although he does have disease in his right lower extremity as well, he has no claudication symptoms at this time.  We will continue observation.  He is due for repeat study in 6 months  Anemia: Obtain CBC to make sure his anemia is not getting worse  Bilateral carotid artery disease: Due for carotid Doppler around March 2023.  CAD s/p CABG: Denies any recent chest pain  Hypertension: Blood pressure stable  Hyperlipidemia: On Praluent  DM2: Managed by primary care provider.           Medication Adjustments/Labs and Tests Ordered: Current medicines are reviewed at length with the patient today.  Concerns regarding medicines are  outlined above.  Orders Placed This Encounter  Procedures   CBC   VAS US CAROTID   No orders of the defined types were placed in this encounter.   Patient Instructions  Medication Instructions:  Your physician recommends that you continue on your current medications as directed. Please refer to the Current Medication list given to you today.  *If you need a refill on your cardiac medications before your next appointment, please call your pharmacy*  Lab Work: Your physician recommends that you return for lab work TODAY:  CBC  If you have labs (blood work) drawn today and  your tests are completely normal, you will receive your results only by: MyChart Message (if you have MyChart) OR A paper copy in the mail If you have any lab test that is abnormal or we need to change your treatment, we will call you to review the results.  Testing/Procedures:  Almyra Deforest, PA-C has requested that you have a carotid duplex. This test is an ultrasound of the carotid arteries in your neck. It looks at blood flow through these arteries that supply the brain with blood. Allow one hour for this exam. There are no restrictions or special instructions.  Please schedule for March of 2024   Follow-Up: At [facility], you and your health needs are our priority.  As part of our continuing mission to provide you with exceptional heart care, we have created designated Provider Care Teams.  These Care Teams include your primary Cardiologist (physician) and Advanced Practice Providers (APPs -  Physician Assistants and Nurse Practitioners) who all work together to provide you with the care you need, when you need it.   Your next appointment:   February 2024  April 2024 The format for your next appointment:   In Person In Person   Provider:   Kirk Ruths, MD    Dr. Fletcher Anon  Other Instructions Monitor blood pressure at home for 1 week. If the top number is consistently 140 or greater increase Coreg  (Carvedilol) to 25 mg 2 times a day and give our office a call.   Important Information About Sugar          Hilbert Corrigan, [place]  10/08/2022 10:50 PM    [facility]

## 2022-10-07 LAB — CBC
Hematocrit: 29.8 % — ABNORMAL LOW (ref 37.5–51.0)
Hemoglobin: 9.9 g/dL — ABNORMAL LOW (ref 13.0–17.7)
MCH: 32.4 pg (ref 26.6–33.0)
MCHC: 33.2 g/dL (ref 31.5–35.7)
MCV: 97 fL (ref 79–97)
Platelets: 288 10*3/uL (ref 150–450)
RBC: 3.06 x10E6/uL — ABNORMAL LOW (ref 4.14–5.80)
RDW: 12.8 % (ref 11.6–15.4)
WBC: 9.1 10*3/uL (ref 3.4–10.8)

## 2022-10-08 ENCOUNTER — Encounter: Payer: Self-pay | Admitting: Physician Assistant

## 2022-10-09 ENCOUNTER — Other Ambulatory Visit: Payer: Self-pay | Admitting: Cardiology

## 2022-10-09 DIAGNOSIS — I2510 Atherosclerotic heart disease of native coronary artery without angina pectoris: Secondary | ICD-10-CM

## 2022-10-09 DIAGNOSIS — E785 Hyperlipidemia, unspecified: Secondary | ICD-10-CM

## 2022-10-09 DIAGNOSIS — I739 Peripheral vascular disease, unspecified: Secondary | ICD-10-CM

## 2022-10-10 ENCOUNTER — Encounter: Payer: Self-pay | Admitting: Family Medicine

## 2022-10-11 ENCOUNTER — Ambulatory Visit (HOSPITAL_COMMUNITY)
Admission: RE | Admit: 2022-10-11 | Discharge: 2022-10-11 | Disposition: A | Discharge status: Home / Self Care | Payer: Medicare HMO | Source: Ambulatory Visit | Attending: Cardiovascular Disease | Admitting: Cardiovascular Disease

## 2022-10-11 ENCOUNTER — Other Ambulatory Visit (HOSPITAL_COMMUNITY): Payer: Self-pay | Admitting: Cardiovascular Disease

## 2022-10-11 DIAGNOSIS — I739 Peripheral vascular disease, unspecified: Secondary | ICD-10-CM

## 2022-10-11 DIAGNOSIS — Z9862 Peripheral vascular angioplasty status: Secondary | ICD-10-CM

## 2022-10-11 NOTE — Telephone Encounter (Signed)
PA submitted for [employer]. Key: [id number]. Approved through 12/12/23

## 2022-10-27 ENCOUNTER — Other Ambulatory Visit: Payer: Self-pay

## 2022-10-27 DIAGNOSIS — M48062 Spinal stenosis, lumbar region with neurogenic claudication: Secondary | ICD-10-CM

## 2022-10-27 DIAGNOSIS — G8929 Other chronic pain: Secondary | ICD-10-CM

## 2022-10-27 DIAGNOSIS — M545 Low back pain, unspecified: Secondary | ICD-10-CM

## 2022-10-27 NOTE — Telephone Encounter (Signed)
Patient's wife calls nurse line requesting to [facility] drug store [facility].   Pharmacy updated and rx sent to PCP.   Veronda Prude, RN

## 2022-10-28 ENCOUNTER — Other Ambulatory Visit: Payer: Self-pay | Admitting: Family Medicine

## 2022-10-28 DIAGNOSIS — E1165 Type 2 diabetes mellitus with hyperglycemia: Secondary | ICD-10-CM

## 2022-10-28 MED ORDER — HYDROCODONE-ACETAMINOPHEN 5-325 MG PO TABS: 2.0000 | ORAL_TABLET | Freq: Two times a day (BID) | ORAL | 0 refills | Status: DC | PRN

## 2022-11-11 ENCOUNTER — Telehealth: Payer: Self-pay

## 2022-11-11 NOTE — Telephone Encounter (Signed)
Patient's wife calls nurse line regarding CGM sensors. Patient has been using [employer] and has placed last sensor. Unsure of when sensor was placed.  She states that our office is supposed to be completing paperwork for him.   Wife is asking for samples until paperwork can be submitted.   Will forward to pharmacy team for further advisement.   Veronda Prude, RN

## 2022-11-14 NOTE — Telephone Encounter (Signed)
Contacted patient to discuss Colin Keller.   Patient reports he has ~ 10 hours remaining on his current sensor.   I shared that I would supply two additional sensors (28 day supply) while the supply issues through his insurance was resolved.    2 sensors labeled and placed at front desk for pick-up later today by patient.   I will work with [name], CPhT to attempt resolution of sensor supply issue.

## 2022-11-14 NOTE — Telephone Encounter (Signed)
Noted and agree. 

## 2022-11-18 DIAGNOSIS — E1169 Type 2 diabetes mellitus with other specified complication: Secondary | ICD-10-CM | POA: Diagnosis not present

## 2022-11-24 ENCOUNTER — Other Ambulatory Visit: Payer: Self-pay

## 2022-11-24 DIAGNOSIS — M545 Low back pain, unspecified: Secondary | ICD-10-CM

## 2022-11-24 DIAGNOSIS — M48062 Spinal stenosis, lumbar region with neurogenic claudication: Secondary | ICD-10-CM

## 2022-11-24 DIAGNOSIS — G8929 Other chronic pain: Secondary | ICD-10-CM

## 2022-11-24 MED ORDER — HYDROCODONE-ACETAMINOPHEN 5-325 MG PO TABS: 2.0000 | ORAL_TABLET | Freq: Two times a day (BID) | ORAL | 0 refills | Status: DC | PRN

## 2022-11-25 ENCOUNTER — Telehealth: Payer: Self-pay

## 2022-11-25 DIAGNOSIS — G8929 Other chronic pain: Secondary | ICD-10-CM

## 2022-11-25 DIAGNOSIS — M48062 Spinal stenosis, lumbar region with neurogenic claudication: Secondary | ICD-10-CM

## 2022-11-25 DIAGNOSIS — M545 Low back pain, unspecified: Secondary | ICD-10-CM

## 2022-11-25 MED ORDER — HYDROCODONE-ACETAMINOPHEN 5-325 MG PO TABS: 2.0000 | ORAL_TABLET | Freq: Two times a day (BID) | ORAL | 0 refills | Status: DC | PRN

## 2022-11-25 NOTE — Telephone Encounter (Signed)
Prescription HC-APAP 5/325 tab, #120 sent [facility] [place]

## 2022-11-25 NOTE — Addendum Note (Signed)
Addended byPerley Jain, Deauna Yaw D on: 11/25/2022 03:10 PM   Modules accepted: Orders

## 2022-11-25 NOTE — Telephone Encounter (Signed)
Does this pharmacy have Hydrocodone Acetaminophen 10 mg/325 mg tablets?

## 2022-11-25 NOTE — Telephone Encounter (Signed)
Received call from [facility] in regards to Hydrocodone refill.   Pharmacy reports they will not double his prescription. They report they do not have "a lot" of Hydrocodone Acetaminophen 5mg -325mg  as well. Therefore, they report not wanting to take away supply from other patients.   The report they will give him #60.   I have called several pharmacies in his area and have not been able to find adequate stock of 10-325mg .   Will forward to PCP.

## 2022-11-25 NOTE — Telephone Encounter (Signed)
[facility] [place]

## 2022-11-25 NOTE — Telephone Encounter (Signed)
Patient has been informed and appreciative.  °

## 2022-12-02 ENCOUNTER — Telehealth: Payer: Self-pay | Admitting: Family Medicine

## 2022-12-02 NOTE — Telephone Encounter (Signed)
N/A unable to leave a message for patient to call back and schedule the Medicare Annual Wellness Visit (AWV) virtually or by telephone.  Last AWV 04/21/21  Please schedule at anytime with [facility].    Any questions, please call me at [phone]

## 2022-12-13 ENCOUNTER — Other Ambulatory Visit: Payer: Self-pay | Admitting: Cardiology

## 2022-12-13 DIAGNOSIS — E1169 Type 2 diabetes mellitus with other specified complication: Secondary | ICD-10-CM

## 2022-12-13 DIAGNOSIS — I739 Peripheral vascular disease, unspecified: Secondary | ICD-10-CM

## 2022-12-13 DIAGNOSIS — I2510 Atherosclerotic heart disease of native coronary artery without angina pectoris: Secondary | ICD-10-CM

## 2022-12-15 ENCOUNTER — Telehealth: Payer: Self-pay

## 2022-12-15 NOTE — Telephone Encounter (Signed)
Received call from [facility] requesting an updated medication list.   Medication faxed to [facility] at [phone]

## 2022-12-16 ENCOUNTER — Other Ambulatory Visit: Payer: Self-pay | Admitting: Pharmacist

## 2022-12-16 DIAGNOSIS — I739 Peripheral vascular disease, unspecified: Secondary | ICD-10-CM

## 2022-12-16 DIAGNOSIS — I2510 Atherosclerotic heart disease of native coronary artery without angina pectoris: Secondary | ICD-10-CM

## 2022-12-16 DIAGNOSIS — E1169 Type 2 diabetes mellitus with other specified complication: Secondary | ICD-10-CM

## 2022-12-16 MED ORDER — REPATHA SURECLICK 140 MG/ML ~~LOC~~ SOAJ: 1.0000 mL | SUBCUTANEOUS | 3 refills | Status: DC

## 2022-12-20 ENCOUNTER — Telehealth: Payer: Self-pay | Admitting: Cardiovascular Disease

## 2022-12-20 NOTE — Telephone Encounter (Signed)
Pt c/o medication issue:  1. Name of Medication: clopidogrel (PLAVIX) 75 MG tablet   2. How are you currently taking this medication (dosage and times per day)?   3. Are you having a reaction (difficulty breathing--STAT)?   4. What is your medication issue? Pt wants to know following his Peripheral Angiogram, if he is supposed to continue taking this blood thinner

## 2022-12-20 NOTE — Telephone Encounter (Signed)
Spoke with patient who wanted to know how long he needs to be on Plavix. According to OV notes on 10/26, he is to be on Plavix and ASA for 6 months until April. Patient informed and verbalized understanding. He has enough refils.

## 2022-12-21 ENCOUNTER — Telehealth: Payer: Self-pay

## 2022-12-21 DIAGNOSIS — M48062 Spinal stenosis, lumbar region with neurogenic claudication: Secondary | ICD-10-CM

## 2022-12-21 DIAGNOSIS — G8929 Other chronic pain: Secondary | ICD-10-CM

## 2022-12-21 DIAGNOSIS — M545 Low back pain, unspecified: Secondary | ICD-10-CM

## 2022-12-21 MED ORDER — HYDROCODONE-ACETAMINOPHEN 10-325 MG PO TABS: 1.0000 | ORAL_TABLET | Freq: Three times a day (TID) | ORAL | 0 refills | Status: DC | PRN

## 2022-12-21 NOTE — Telephone Encounter (Signed)
Received message from [employer] regarding patient's pain medication refill. They are requesting that authorization be provided for patient to pick up on Saturday,1/13 due to pharmacy being closed on Sunday.  Called [facility].   They now have stock of the hydrocodone-acetaminophen 10-325 mg tablets.   If appropriate, please send refill.   Thanks.   Talbot Grumbling, RN

## 2022-12-21 NOTE — Telephone Encounter (Signed)
Spoke with pt, Follow up scheduled per recall. 

## 2022-12-21 NOTE — Telephone Encounter (Signed)
Prescription sent topharmacy HC-APAP 10-325 tab, #60, disp on 12/24/22

## 2023-01-02 NOTE — Progress Notes (Signed)
HPI: Follow-up coronary artery disease.  Previously admitted to [facility] with non-ST elevation myocardial infarction.  Echocardiogram September 2019 showed ejection fraction 50 to 07% and mild diastolic dysfunction.  Cardiac catheterization September 2019 showed severe coronary disease and he ultimately had coronary artery bypass and graft with a LIMA to the LAD, RIMA to the right coronary artery and saphenous vein graft to the obtuse marginal. He had problems with hyperkalemia postoperatively.  Also with chronic stage III kidney disease.  Echo 10/19 showed EF 50, mild MR and biatrial enlargement. Abdominal ultrasound December 2020 showed no aneurysm. Carotid Dopplers March 2023 showed 60 to 79% right and 40 to 59% left stenosis.  Patient had orbital atherectomy and drug coated balloon angioplasty of the left SFA October 2023.  Since he was last seen he denies dyspnea, chest pain, palpitations or syncope.  Current Outpatient Medications  Medication Sig Dispense Refill   aspirin 81 MG tablet Take 81 mg by mouth daily.     carvedilol (COREG) 12.5 MG tablet Take 1 tablet (12.5 mg total) by mouth 2 (two) times daily with a meal. 180 tablet 3   clopidogrel (PLAVIX) 75 MG tablet Take 1 tablet (75 mg total) by mouth daily. 90 tablet 1   Continuous Blood Gluc Sensor (FREESTYLE LIBRE 2 SENSOR) MISC 1 Device by Does not apply route every 14 (fourteen) days.     Cyanocobalamin (VITAMIN B 12 PO) Take 1 tablet by mouth daily.     Evolocumab (REPATHA SURECLICK) 622 MG/ML SOAJ Inject 140 mg into the skin every 14 (fourteen) days. 6 mL 3   famotidine (PEPCID) 20 MG tablet Take 20 mg by mouth daily.     hydrALAZINE (APRESOLINE) 50 MG tablet Take 1 tablet (50 mg total) by mouth 3 (three) times daily. 270 tablet 1   HYDROcodone-acetaminophen (NORCO) 10-325 MG tablet Take 1 tablet by mouth every 8 (eight) hours as needed. 60 tablet 0   Insulin Glargine (BASAGLAR KWIKPEN) 100 UNIT/ML DIAL AND INJECT 16 TO  18 UNITS UNDER THE SKIN DAILY. 16.2 mL 3   insulin lispro (HUMALOG) 100 UNIT/ML injection Inject 0.03-0.05 mLs (3-5 Units total) into the skin 2 (two) times daily with a meal. 3-4 units prior to breakfast, 4-5 units prior to evening meal (Patient taking differently: Inject 3-5 Units into the skin See admin instructions. 3-4 units prior to breakfast, 4-5 units prior to evening meal as needed for high blood sugar)     metFORMIN (GLUCOPHAGE) 500 MG tablet TAKE 2 TABLETS BY MOUTH TWICE DAILY WITH A MEAL 180 tablet 3   tamsulosin (FLOMAX) 0.4 MG CAPS capsule TAKE 1 CAPSULE BY MOUTH DAILY 90 capsule 3   No current facility-administered medications for this visit.     Past Medical History:  Diagnosis Date   Acute diastolic heart failure ([place]) 08/18/2018   Atherosclerosis of both carotid arteries 08/18/2020   Carotid dopplers 08/2019 40-59% bilateral stenosis and right subclavian stenosis   Bilateral carotid artery stenosis 08/18/2020   Right Carotid: Velocities in the right ICA are consistent with a 60-79%                 stenosis. Non-hemodynamically significant plaque <50% noted in                 the CCA. The ECA appears >50% stenosed. Proximal subclavian                 artery dilatation with elevated and turbulent flow just  past the                 narrowing, measuring 1.6 cm.   Left Carotid: Velocities in the left ICA are    CHF (congestive heart failure) (HCC)    Chronic left shoulder pain 03/18/2021   Chronic low back pain without sciatica 10/16/2008   H/o chronic LBP with h/o lumbar disc herniation and intermittent radicular pain  Chronic pain, on stable doses of Norco 10/325 TID. MRI in 2003 showing SMALL CENTRAL DISC HERNIATION AT L4-5.  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      CKD (chronic kidney disease), stage III (HCC)    Coronary artery disease    Diabetes mellitus    Dyspnea    GERD (gastroesophageal reflux disease)    Hx of CABG 08/23/2018   LIMA to LAD, RIMA to  RCA, SVG to OM3, EVH via right thigh   Hyperlipidemia    Hypertension    Hypertension associated with diabetes (HCC) 12/22/2008   Qualifier: Diagnosis of  By: Lafonda Mosses MD, Susan     Hypertensive nephropathy 08/18/2020   Insulin dependent type 2 diabetes mellitus (HCC) 12/22/2020   Long term (current) use of antithrombotics/antiplatelets 09/21/2022   Planned 11-months DAPT for DES for PAD on 09/21/2022.  Planned 6 months therapy. End 03/23/23.   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   LIMA to LAD, RIMA to RCA, SVG to OM3, [facility] via right thigh   Sleeping difficulties 09/08/2019   Subclavian artery stenosis, right (HCC) 08/18/2020   Carotid dopplers 08/2019 40-59% bilateral stenosis and right subclavian stenosis   Tobacco abuse    Tobacco abuse disorder    Qualifier: Diagnosis of  By: Lafonda Mosses MD, Darl Pikes      Past Surgical History:  Procedure Laterality Date   ABDOMINAL AORTOGRAM W/LOWER EXTREMITY N/A 09/21/2022   Procedure: ABDOMINAL AORTOGRAM W/LOWER EXTREMITY;  Surgeon: Iran Ouch, MD;  Location: [facility] INVASIVE CV LAB;  Service: Cardiovascular;  Laterality: N/A;   CORONARY ARTERY BYPASS GRAFT N/A 08/23/2018   Procedure: CORONARY ARTERY BYPASS GRAFTING (CABG) x 3; -Left Internal Mammary Artery to Left Anterior Descending Artery, -Right Internal Mammary Artery to Right Coronary Artery, -Saphenous Vein Graft to Obtuse Marginal;  ENDOSCOPIC HARVEST GREATER SAPHENOUS VEIN  -Right Thigh;  Surgeon: Purcell Nails, MD;  Location: [facility] OR;  Service: Open  Heart Surgery;  Laterality: N/A;   INTRAVASCULAR PRESSURE WIRE/FFR STUDY N/A 08/20/2018   Procedure: INTRAVASCULAR PRESSURE WIRE/FFR STUDY;  Surgeon: Tonny Bollman, MD;  Location: [facility] INVASIVE CV LAB;  Service: Cardiovascular;  Laterality: N/A;   LEFT HEART CATH AND CORONARY ANGIOGRAPHY N/A 08/20/2018   Procedure: LEFT HEART CATH AND CORONARY ANGIOGRAPHY;  Surgeon: Tonny Bollman, MD;  Location: [facility] INVASIVE CV LAB;  Service: Cardiovascular;  Laterality: N/A;   PERIPHERAL VASCULAR ATHERECTOMY Left 09/21/2022   Procedure: PERIPHERAL VASCULAR ATHERECTOMY;  Surgeon: Wellington Hampshire, MD;  Location: [place] CV LAB;  Service: Cardiovascular;  Laterality: Left;  SFA   PERIPHERAL VASCULAR BALLOON ANGIOPLASTY Left 09/21/2022   Procedure: PERIPHERAL VASCULAR BALLOON ANGIOPLASTY;  Surgeon: Wellington Hampshire, MD;  Location: [place] CV LAB;  Service: Cardiovascular;  Laterality: Left;  SFA   TEE WITHOUT CARDIOVERSION N/A 08/23/2018   Procedure: TRANSESOPHAGEAL ECHOCARDIOGRAM (TEE);  Surgeon: Rexene Alberts, MD;  Location: [place];  Service: Open Heart Surgery;  Laterality: N/A;    Social History   Socioeconomic History   Marital status: Married    Spouse name: Cecille Rubin    Number of children: 2   Years of education: 10   Highest education level: 10th grade  Occupational History   Occupation: Games developer  Tobacco Use   Smoking status: Every Day    Packs/day: 1.00    Years: 48.00    Total pack years: 48.00    Types: Cigarettes    Start date: 08/12/1970    Last attempt to quit: 08/12/2018    Years since quitting: 4.4   Smokeless tobacco: Never   Tobacco comments:    Quit post CABG in 2019 - previous 1ppd  Vaping Use   Vaping Use: Never used  Substance and Sexual Activity   Alcohol use: No    Alcohol/week: 0.0 standard drinks of alcohol   Drug use: No   Sexual activity: Yes    Birth control/protection: Post-menopausal  Other Topics Concern   Not on file  Social History Narrative   Lives with  his wife in [place].    Wife Cecille Rubin is also [facility] patient.   Works odd-jobs in [employer].    Previously used marijuana -quit 10/03. Previous 1ppd smoker- quit 2019.   Surrogate decision maker: Gervase Colberg (wife)   Patient has one daughter.   Patients son died of a drug overdose Mar 09, 2021.   Patient has 2 dogs and 2 cats.       Social Determinants of Health   Financial Resource Strain: Low Risk  (04/21/2021)   Overall Financial Resource Strain (CARDIA)    Difficulty of Paying Living Expenses: Not hard at all  Food Insecurity: No Food Insecurity (09/21/2022)   Hunger Vital Sign    Worried About Running Out of Food in the Last Year: Never true    Ran Out of Food in the Last Year: Never true  Transportation Needs: No Transportation Needs (09/21/2022)   PRAPARE - Hydrologist (Medical): No    Lack of Transportation (Non-Medical): No  Physical Activity: Sufficiently Active (04/21/2021)   Exercise Vital Sign    Days of Exercise per Week: 5 days    Minutes of Exercise per Session: 30 min  Stress: No Stress Concern Present (04/21/2021)   [place]    Feeling of Stress : Not at all  Social Connections: Moderately Isolated (04/21/2021)   Social Connection and Isolation Panel [NHANES]    Frequency of Communication with Friends and Family: More than three times a week    Frequency of Social Gatherings with Friends and Family: More than three times a week    Attends Religious Services: Never    Marine scientist or Organizations: No    Attends Archivist Meetings: Never    Marital Status: Married  Human resources officer Violence: Not At Risk (09/21/2022)  Humiliation, Afraid, Rape, and Kick questionnaire    Fear of Current or Ex-Partner: No    Emotionally Abused: No    Physically Abused: No    Sexually Abused: No    Family History  Problem Relation Age of Onset   Heart disease Mother     Diabetes Mother    Heart failure Mother    Heart disease Father    Sudden death Brother    Drug abuse Son     ROS: no fevers or chills, productive cough, hemoptysis, dysphasia, odynophagia, melena, hematochezia, dysuria, hematuria, rash, seizure activity, orthopnea, PND, pedal edema, claudication. Remaining systems are negative.  Physical Exam: Well-developed well-nourished in no acute distress.  Skin is warm and dry.  HEENT is normal.  Neck is supple.  Chest is clear to auscultation with normal expansion.  Cardiovascular exam is regular rate and rhythm.  Abdominal exam nontender or distended. No masses palpated. Extremities show no edema. neuro grossly intact   A/P  1 coronary artery disease-patient denies chest pain.  Continue aspirin.  He is intolerant to statins.  2 peripheral vascular disease-followed by Dr. Kirke Corin.  3 hypertension-blood pressure controlled.  Continue present medical regimen.  4 hyperlipidemia-intolerant to statins.  Continue Praluent.  5 carotid artery disease-plan follow-up carotid Dopplers March 2024.  6 chronic combined systolic/diastolic congestive heart failure-patient appears to be euvolemic today.  7 chronic stage III kidney disease-managed by nephrology.  8 history of statin myopathy.  9 Tobacco abuse-patient counseled on discontinuing.  Olga Millers, MD

## 2023-01-16 ENCOUNTER — Encounter: Payer: Self-pay | Admitting: Cardiology

## 2023-01-16 ENCOUNTER — Ambulatory Visit: Payer: Medicare PPO | Attending: Cardiology | Admitting: Cardiology

## 2023-01-16 VITALS — BP 142/70 | HR 74 | Ht 71.0 in | Wt 150.0 lb

## 2023-01-16 DIAGNOSIS — I771 Stricture of artery: Secondary | ICD-10-CM

## 2023-01-16 DIAGNOSIS — I2510 Atherosclerotic heart disease of native coronary artery without angina pectoris: Secondary | ICD-10-CM | POA: Diagnosis not present

## 2023-01-16 DIAGNOSIS — I10 Essential (primary) hypertension: Secondary | ICD-10-CM | POA: Diagnosis not present

## 2023-01-16 DIAGNOSIS — I739 Peripheral vascular disease, unspecified: Secondary | ICD-10-CM

## 2023-01-16 DIAGNOSIS — E1169 Type 2 diabetes mellitus with other specified complication: Secondary | ICD-10-CM

## 2023-01-16 DIAGNOSIS — E785 Hyperlipidemia, unspecified: Secondary | ICD-10-CM

## 2023-01-16 MED ORDER — HYDRALAZINE HCL 100 MG PO TABS: 50.0000 mg | ORAL_TABLET | Freq: Two times a day (BID) | ORAL | 3 refills | Status: DC

## 2023-01-16 NOTE — Patient Instructions (Signed)
Medication Instructions:   INCREASE HYDRALAZINE TO 100 MG TWICE DAILY= 2 OF THE 50 MG TABLETS TWICE DAILY  *If you need a refill on your cardiac medications before your next appointment, please call your pharmacy*   Testing/Procedures:  Your physician has requested that you have a carotid duplex. This test is an ultrasound of the carotid arteries in your neck. It looks at blood flow through these arteries that supply the brain with blood. Allow one hour for this exam. There are no restrictions or special instructions. [facility] OFFICE-SCHEDULE IN MARCH   Follow-Up: At [facility], you and your health needs are our priority.  As part of our continuing mission to provide you with exceptional heart care, we have created designated Provider Care Teams.  These Care Teams include your primary Cardiologist (physician) and Advanced Practice Providers (APPs -  Physician Assistants and Nurse Practitioners) who all work together to provide you with the care you need, when you need it.  We recommend signing up for the patient portal called "MyChart".  Sign up information is provided on this After Visit Summary.  MyChart is used to connect with patients for Virtual Visits (Telemedicine).  Patients are able to view lab/test results, encounter notes, upcoming appointments, etc.  Non-urgent messages can be sent to your provider as well.   To learn more about what you can do with MyChart, go to [email or web address].    Your next appointment:   12 month(s)  Provider:   Kirk Ruths, MD

## 2023-01-19 ENCOUNTER — Other Ambulatory Visit: Payer: Self-pay

## 2023-01-19 NOTE — Telephone Encounter (Signed)
Spoke with patient. Informed of note left by PCP. Patient understood. Salvatore Marvel, CMA

## 2023-01-19 NOTE — Telephone Encounter (Signed)
Please let the patient know:  Because of the hydrocodone - Acetaminophen tablets being on back-order at many pharmacies, Dr Lige Lakeman sent an equivalent dose of oxycodone tablets to his pharmacy.   He should take 1 and a half tablets of the oxycodone to equal one of his usual hydrocodone-acetaminophen 10 mg/325 mg tablet.   Once the hydrocodone tablet shortage is resolved, will return to prescribing them instead of oxycodone/

## 2023-01-19 NOTE — Telephone Encounter (Signed)
Received phone call from patient's wife requesting hydrocodone-acetaminophen refill.   Called [facility], hydrocodone-acet 10-325 mg is on back order. Called [facility] in [place], [place], and [facility]. All pharmacies have medication on back order with no known ETA. Asked about hydrocodone-acetaminophen 5-325 mg. This is also on back order.   Pharmacist reports that they do have oxycodone in stock. Combination oxy-acet has very limited stock and is recommended that combo pill not be sent over.   Will forward to PCP for further med management.   Talbot Grumbling, RN

## 2023-01-20 MED ORDER — OXYCODONE HCL 5 MG PO TABS: 7.5000 mg | ORAL_TABLET | Freq: Three times a day (TID) | ORAL | 0 refills | Status: DC | PRN

## 2023-01-20 NOTE — Telephone Encounter (Signed)
Prescription signed and sent.

## 2023-01-20 NOTE — Telephone Encounter (Signed)
Patient's wife returns call to nurse line. Medication is still pended.   Will forward to PCP to sign and send to pharmacy.   Talbot Grumbling, RN

## 2023-01-28 ENCOUNTER — Other Ambulatory Visit: Payer: Self-pay | Admitting: Family Medicine

## 2023-01-28 DIAGNOSIS — E1159 Type 2 diabetes mellitus with other circulatory complications: Secondary | ICD-10-CM

## 2023-02-10 ENCOUNTER — Ambulatory Visit (HOSPITAL_COMMUNITY)
Admission: RE | Admit: 2023-02-10 | Discharge: 2023-02-10 | Disposition: A | Discharge status: Home / Self Care | Payer: Medicare HMO | Source: Ambulatory Visit | Attending: Cardiology | Admitting: Cardiology

## 2023-02-10 DIAGNOSIS — I771 Stricture of artery: Secondary | ICD-10-CM

## 2023-02-13 ENCOUNTER — Encounter (HOSPITAL_COMMUNITY): Payer: Medicare HMO

## 2023-02-13 ENCOUNTER — Encounter: Payer: Self-pay | Admitting: Family Medicine

## 2023-02-13 DIAGNOSIS — I6521 Occlusion and stenosis of right carotid artery: Secondary | ICD-10-CM | POA: Insufficient documentation

## 2023-02-14 ENCOUNTER — Telehealth: Payer: Self-pay

## 2023-02-14 DIAGNOSIS — G8929 Other chronic pain: Secondary | ICD-10-CM

## 2023-02-14 DIAGNOSIS — M48062 Spinal stenosis, lumbar region with neurogenic claudication: Secondary | ICD-10-CM

## 2023-02-14 MED ORDER — HYDROCODONE-ACETAMINOPHEN 7.5-325 MG PO TABS: 1.0000 | ORAL_TABLET | Freq: Four times a day (QID) | ORAL | 0 refills | Status: DC | PRN

## 2023-02-14 NOTE — Telephone Encounter (Signed)
Patient's wife calls nurse line regarding pain medication refill. Due to shortages, pharmacy remains out of hydrocodone-acetaminophen 10-325 mg. Wife is requesting that patient receive prescription for hydrocodone-acetaminophen 7.5 mg-325 mg, as pharmacy has this dosage in stock. She reports better pain management with hydrocodone vs oxycodone.   If appropriate, please send this prescription to [facility].   Talbot Grumbling, RN

## 2023-02-14 NOTE — Telephone Encounter (Signed)
Prescription HC-APAP 7.5/325 three times a day dispense 02/18/23

## 2023-02-17 ENCOUNTER — Encounter: Payer: Self-pay | Admitting: *Deleted

## 2023-02-17 MED ORDER — HYDROCODONE-ACETAMINOPHEN 10-325 MG PO TABS: 1.0000 | ORAL_TABLET | Freq: Two times a day (BID) | ORAL | 0 refills | Status: DC | PRN

## 2023-02-17 NOTE — Telephone Encounter (Signed)
Prior hydrocodone 7.5/325 tablet #90 Prescription stopped Prescription for hydrocodone-APAP 10/325 #60 no RF sent in to pharmacy

## 2023-02-17 NOTE — Telephone Encounter (Signed)
Patient's wife returns call to nurse line regarding pain med prescription.   She reports that pharmacy now has stock of hydrocodone 10-325 mg. Spoke with Dr. McDiarmid regarding change in prescription.   Canceled 7.5-325 mg rx at pharmacy. Will forward to PCP to send in updated 10-325 mg rx.   Thanks.   Talbot Grumbling, RN

## 2023-02-17 NOTE — Addendum Note (Signed)
Addended by: Lissa Morales D on: 02/17/2023 03:46 PM   Modules accepted: Orders

## 2023-03-16 ENCOUNTER — Telehealth: Payer: Self-pay

## 2023-03-16 DIAGNOSIS — M4800 Spinal stenosis, site unspecified: Secondary | ICD-10-CM

## 2023-03-16 DIAGNOSIS — M545 Low back pain, unspecified: Secondary | ICD-10-CM

## 2023-03-16 DIAGNOSIS — G8929 Other chronic pain: Secondary | ICD-10-CM

## 2023-03-16 MED ORDER — HYDROCODONE-ACETAMINOPHEN 7.5-325 MG PO TABS: 1.0000 | ORAL_TABLET | Freq: Three times a day (TID) | ORAL | 0 refills | Status: DC | PRN

## 2023-03-16 NOTE — Telephone Encounter (Signed)
Prescription sent HC-APAP 7.5-325 #80 tab fill 03/17/23. Last until 04/16/23

## 2023-03-16 NOTE — Telephone Encounter (Signed)
Wife returns call to nurse line. States that patient is also requesting nausea medicine. Previously prescribed phenergan.    Reports that he is having slight nausea. No vomiting, diarrhea or fever.   Advised that this has not been filled since 2017. Advised that PCP may not be able to send this in without an appointment.   Wife voices understanding. She is asking that I send request to PCP.   *Did schedule patient for PCP follow up on 04/13/23. Discussed that if patient had worsening of symptoms or starting having multiple episodes of vomiting, he would need to be seen by another provider.   Talbot Grumbling, RN

## 2023-03-16 NOTE — Telephone Encounter (Signed)
Patient's wife returns call to nurse line regarding prescription refill.   Due to shortage of 10-325 mg hydrocodone, she is requesting that provider send prescription for hydrocodone 7.5-325 mg tablets.   She is requesting fill on Friday if possible due to going out of town early on Saturday morning.   Advised that I would send message to provider.   Talbot Grumbling, RN

## 2023-03-16 NOTE — Telephone Encounter (Signed)
Patient's wife called nurse line regarding hydrocodone-acetaminophen prescription.   She reports that they are going out of town on Saturday and will not be back until 4/14.   Requesting to be able to pick up refill on pain medicine on Saturday before leaving.   Called [facility]. They do not have enough stock to fill patient's 60 tablet prescription. Pharmacist said they could try to order more, however, unsure when this will come in.   Attempted to contact wife with update. She did not answer, LVM asking her to call back.   Talbot Grumbling, RN

## 2023-03-16 NOTE — Addendum Note (Signed)
Addended by: Lissa Morales D on: 03/16/2023 04:29 PM   Modules accepted: Orders

## 2023-03-20 ENCOUNTER — Other Ambulatory Visit: Payer: Self-pay | Admitting: Physician Assistant

## 2023-03-20 DIAGNOSIS — I10 Essential (primary) hypertension: Secondary | ICD-10-CM

## 2023-03-22 MED ORDER — HYDRALAZINE HCL 100 MG PO TABS: 50.0000 mg | ORAL_TABLET | Freq: Two times a day (BID) | ORAL | 3 refills | Status: DC

## 2023-03-28 ENCOUNTER — Telehealth: Payer: Self-pay | Admitting: Cardiovascular Disease

## 2023-03-28 NOTE — Telephone Encounter (Signed)
Pt c/o medication issue:  1. Name of Medication: clopidogrel (PLAVIX) 75 MG tablet   2. How are you currently taking this medication (dosage and times per day)? TAKE 1 TABLET BY MOUTH DAILY   3. Are you having a reaction (difficulty breathing--STAT)? No   4. What is your medication issue? Patient wants to know if Dr. Kirke Corin still wants him to take medication before and after appt

## 2023-03-28 NOTE — Telephone Encounter (Signed)
Left a message for the patient to call back.  

## 2023-03-30 NOTE — Telephone Encounter (Signed)
Left a message for the patient to call back if anything further was needed. He has an appointment on 4/30 with Dr. Kirke Corin.

## 2023-04-10 ENCOUNTER — Encounter: Payer: Self-pay | Admitting: Family Medicine

## 2023-04-10 DIAGNOSIS — G720 Drug-induced myopathy: Secondary | ICD-10-CM | POA: Insufficient documentation

## 2023-04-11 ENCOUNTER — Other Ambulatory Visit: Payer: Self-pay

## 2023-04-11 ENCOUNTER — Encounter: Payer: Self-pay | Admitting: Cardiovascular Disease

## 2023-04-11 ENCOUNTER — Ambulatory Visit: Payer: Medicare HMO | Attending: Cardiovascular Disease | Admitting: Cardiovascular Disease

## 2023-04-11 VITALS — BP 168/64 | HR 73 | Ht 71.0 in | Wt 147.6 lb

## 2023-04-11 DIAGNOSIS — I739 Peripheral vascular disease, unspecified: Secondary | ICD-10-CM

## 2023-04-11 DIAGNOSIS — I6523 Occlusion and stenosis of bilateral carotid arteries: Secondary | ICD-10-CM

## 2023-04-11 DIAGNOSIS — E1159 Type 2 diabetes mellitus with other circulatory complications: Secondary | ICD-10-CM

## 2023-04-11 DIAGNOSIS — G8929 Other chronic pain: Secondary | ICD-10-CM

## 2023-04-11 DIAGNOSIS — Z720 Tobacco use: Secondary | ICD-10-CM

## 2023-04-11 DIAGNOSIS — I152 Hypertension secondary to endocrine disorders: Secondary | ICD-10-CM

## 2023-04-11 DIAGNOSIS — E785 Hyperlipidemia, unspecified: Secondary | ICD-10-CM

## 2023-04-11 DIAGNOSIS — I2510 Atherosclerotic heart disease of native coronary artery without angina pectoris: Secondary | ICD-10-CM

## 2023-04-11 DIAGNOSIS — I10 Essential (primary) hypertension: Secondary | ICD-10-CM | POA: Diagnosis not present

## 2023-04-11 DIAGNOSIS — M4800 Spinal stenosis, site unspecified: Secondary | ICD-10-CM

## 2023-04-11 MED ORDER — CARVEDILOL 25 MG PO TABS: 25.0000 mg | ORAL_TABLET | Freq: Two times a day (BID) | ORAL | 3 refills | Status: DC

## 2023-04-11 NOTE — Telephone Encounter (Signed)
Patient's wife calls nurse line regarding pain medicine refill.   Called pharmacy. Patient picked up 80 tablets, 26 day supply on 03/18/23.   Please advise if refill can be sent to pharmacy. Verified that pharmacy does have stock on hydrocodone-acetaminophen 7.5 mg-325 mg at this time.   Veronda Prude, RN

## 2023-04-11 NOTE — Progress Notes (Signed)
Cardiology Office Note   Date:  04/11/2023   ID:  Colin Keller, DOB Mar 13, 1956, MRN [id number]  PCP:  McDiarmid, Leighton Roach, MD  Cardiologist: Dr. Jens Som  No chief complaint on file.      History of Present Illness: Colin Keller is a 67 y.o. male who is here today for follow-up visit regarding peripheral arterial disease.  He has known history of coronary artery disease with previous non-ST elevation myocardial infarction in 2019.  Cardiac catheterization showed severe three-vessel coronary artery disease and he was treated with CABG.  He has chronic medical conditions include hyperlipidemia with intolerance to statins, chronic kidney disease, type 2 diabetes and carotid artery disease. Previous screening for abdominal aortic aneurysm in 2020 was negative.  Moderately elevated velocities were noted in the iliac arteries. He has been followed for peripheral arterial disease with bilateral SFA and popliteal disease and moderately reduced ABI.  He had worsening symptoms last year with progression to rest pain on the left side.  Repeat Doppler studies showed a drop in ABI to 0.39 on the left. I proceeded with angiography in October 2023 which showed no significant aortoiliac disease.  On the left side, there was severely calcified disease affecting the proximal as well as distal SFA with three-vessel runoff below the knee.  There was short occlusion of the distal SFA.  I performed successful orbital atherectomy and drug-coated balloon angioplasty to the left SFA with excellent results. He had significant improvement in left calf claudication since then although his symptoms did not resolve completely. He reports stable symptoms affecting the right calf.   Past Medical History:  Diagnosis Date   Acute diastolic heart failure (HCC) 08/18/2018   Atherosclerosis of both carotid arteries 08/18/2020   Carotid dopplers 08/2019 40-59% bilateral stenosis and right subclavian stenosis   Bilateral  carotid artery stenosis 08/18/2020   Right Carotid: Velocities in the right ICA are consistent with a 60-79%                 stenosis. Non-hemodynamically significant plaque <50% noted in                 the CCA. The ECA appears >50% stenosed. Proximal subclavian                 artery dilatation with elevated and turbulent flow just past the                 narrowing, measuring 1.6 cm.   Left Carotid: Velocities in the left ICA are    CHF (congestive heart failure) (HCC)    Chronic left shoulder pain 03/18/2021   Chronic low back pain without sciatica 10/16/2008   H/o chronic LBP with h/o lumbar disc herniation and intermittent radicular pain  Chronic pain, on stable doses of Norco 10/325 TID. MRI in 2003 showing SMALL CENTRAL DISC HERNIATION AT L4-5.  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      CKD (chronic kidney disease), stage III (HCC)    Coronary artery disease    Diabetes mellitus    Dyspnea    GERD (gastroesophageal reflux disease)    Hx of CABG 08/23/2018   LIMA to LAD, RIMA to RCA, SVG to OM3, EVH via right thigh   Hyperlipidemia    Hypertension    Hypertension associated with diabetes (HCC) 12/22/2008   Qualifier: Diagnosis of  By: Lafonda Mosses MD, Susan     Hypertensive nephropathy 08/18/2020   Insulin  dependent type 2 diabetes mellitus (HCC) 12/22/2020   Long term (current) use of antithrombotics/antiplatelets 09/21/2022   Planned 86-months DAPT for DES for PAD on 09/21/2022.  Planned 6 months therapy. End 03/23/23.   Peripheral artery disease (HCC) 08/31/2021   [facility] Vascular Study lower extremities 08/30/21:            Today's ABI  Today's TBI   Previous ABIPrevious TBI  +-------+-----------+-----------+------------+------------+  Right  0.76       0.31       1.03                      +-------+-----------+-----------+------------+------------+  Left   0.63       0.20       0.95                      +-------+-----------+---------   Proteinuria  08/18/2020   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   LIMA to LAD, RIMA to RCA, SVG to OM3, [facility] via right thigh   Sleeping difficulties 09/08/2019   Subclavian artery stenosis, right (HCC) 08/18/2020   Carotid dopplers 08/2019 40-59% bilateral stenosis and right subclavian stenosis   Tobacco abuse    Tobacco abuse disorder    Qualifier: Diagnosis of  By: Lafonda Mosses MD, Darl Pikes      Past Surgical History:  Procedure Laterality Date   ABDOMINAL AORTOGRAM W/LOWER EXTREMITY N/A 09/21/2022   Procedure: ABDOMINAL AORTOGRAM W/LOWER EXTREMITY;  Surgeon: Iran Ouch, MD;  Location: [facility] INVASIVE CV LAB;  Service: Cardiovascular;  Laterality: N/A;   CORONARY ARTERY BYPASS GRAFT N/A 08/23/2018   Procedure: CORONARY ARTERY BYPASS GRAFTING (CABG) x 3; -Left Internal Mammary Artery to Left Anterior Descending Artery, -Right Internal Mammary Artery to Right Coronary Artery, -Saphenous Vein Graft to Obtuse Marginal;  ENDOSCOPIC HARVEST GREATER SAPHENOUS VEIN  -Right Thigh;  Surgeon: Purcell Nails, MD;  Location: [facility] OR;  Service: Open Heart Surgery;  Laterality: N/A;   CORONARY PRESSURE/FFR STUDY N/A 08/20/2018   Procedure: INTRAVASCULAR PRESSURE WIRE/FFR STUDY;  Surgeon: Tonny Bollman, MD;  Location: [facility] INVASIVE CV LAB;  Service: Cardiovascular;  Laterality: N/A;   LEFT HEART CATH AND CORONARY ANGIOGRAPHY N/A 08/20/2018   Procedure: LEFT HEART CATH AND CORONARY ANGIOGRAPHY;  Surgeon: Tonny Bollman, MD;  Location: [facility] INVASIVE CV LAB;  Service: Cardiovascular;  Laterality: N/A;   PERIPHERAL VASCULAR ATHERECTOMY Left 09/21/2022   Procedure: PERIPHERAL VASCULAR ATHERECTOMY;  Surgeon: Iran Ouch, MD;  Location: [facility] INVASIVE CV LAB;  Service: Cardiovascular;  Laterality: Left;  SFA   PERIPHERAL VASCULAR BALLOON ANGIOPLASTY Left 09/21/2022   Procedure: PERIPHERAL VASCULAR BALLOON ANGIOPLASTY;  Surgeon: Iran Ouch, MD;  Location: [facility] INVASIVE CV LAB;  Service: Cardiovascular;  Laterality: Left;  SFA   TEE WITHOUT  CARDIOVERSION N/A 08/23/2018   Procedure: TRANSESOPHAGEAL ECHOCARDIOGRAM (TEE);  Surgeon: Purcell Nails, MD;  Location: [facility] OR;  Service: Open Heart Surgery;  Laterality: N/A;     Current Outpatient Medications  Medication Sig Dispense Refill   aspirin 81 MG tablet Take 81 mg by mouth daily.     carvedilol (COREG) 12.5 MG tablet TAKE 1 TABLET BY MOUTH TWICE DAILY WITH A MEAL 180 tablet 3   Continuous Blood Gluc Sensor (FREESTYLE LIBRE 2 SENSOR) MISC 1 Device by Does not apply route every 14 (fourteen) days.     Cyanocobalamin (VITAMIN B 12 PO) Take 1 tablet by mouth daily.     Evolocumab (REPATHA SURECLICK) 140 MG/ML SOAJ  Inject 140 mg into the skin every 14 (fourteen) days. 6 mL 3   famotidine (PEPCID) 20 MG tablet Take 20 mg by mouth daily.     hydrALAZINE (APRESOLINE) 100 MG tablet Take 0.5 tablets (50 mg total) by mouth 2 (two) times daily. (Patient taking differently: Take 100 mg by mouth 2 (two) times daily.) 180 tablet 3   HYDROcodone-acetaminophen (NORCO) 7.5-325 MG tablet Take 1 tablet by mouth every 8 (eight) hours as needed for moderate pain. 80 tablet 0   Insulin Glargine (BASAGLAR KWIKPEN) 100 UNIT/ML DIAL AND INJECT 16 TO 18 UNITS UNDER THE SKIN DAILY. 16.2 mL 3   insulin lispro (HUMALOG) 100 UNIT/ML injection Inject 0.03-0.05 mLs (3-5 Units total) into the skin 2 (two) times daily with a meal. 3-4 units prior to breakfast, 4-5 units prior to evening meal (Patient taking differently: Inject 3-5 Units into the skin See admin instructions. 3-4 units prior to breakfast, 4-5 units prior to evening meal as needed for high blood sugar)     metFORMIN (GLUCOPHAGE) 500 MG tablet TAKE 2 TABLETS BY MOUTH TWICE DAILY WITH A MEAL 180 tablet 3   tamsulosin (FLOMAX) 0.4 MG CAPS capsule TAKE 1 CAPSULE BY MOUTH DAILY 90 capsule 3   No current facility-administered medications for this visit.    Allergies:   Amlodipine, Amitriptyline, Nortriptyline hcl, Statins, and Simvastatin    Social  History:  The patient  reports that he has been smoking cigarettes. He started smoking about 52 years ago. He has a 48.00 pack-year smoking history. He has never used smokeless tobacco. He reports that he does not drink alcohol and does not use drugs.   Family History:  The patient's family history includes Diabetes in his mother; Drug abuse in his son; Heart disease in his father and mother; Heart failure in his mother; Sudden death in his brother.    ROS:  Please see the history of present illness.   Otherwise, review of systems are positive for none.   All other systems are reviewed and negative.    PHYSICAL EXAM: VS:  BP (!) 171/65   Pulse 73   Ht 5\' 11"  (1.803 m)   Wt 147 lb 9.6 oz (67 kg)   SpO2 99%   BMI 20.59 kg/m  , BMI Body mass index is 20.59 kg/m. GEN: Well nourished, well developed, in no acute distress  HEENT: normal  Neck: no JVD or masses.  Bilateral carotid bruits Cardiac: RRR; no murmurs, rubs, or gallops,no edema  Respiratory:  clear to auscultation bilaterally, normal work of breathing GI: soft, nontender, nondistended, + BS MS: no deformity or atrophy  Skin: warm and dry, no rash Neuro:  Strength and sensation are intact Psych: euthymic mood, full affect Vascular: Femoral pulses +2 bilaterally.  Distal pulses are not palpable on the right and +1 on the left.   EKG:  EKG is  ordered today. EKG showed normal sinus rhythm with minimal LVH and possible old inferior infarct.   Recent Labs: 09/22/2022: BUN 25; Creatinine, Ser 1.40; Potassium 4.1; Sodium 136 10/06/2022: Hemoglobin 9.9; Platelets 288    Lipid Panel    Component Value Date/Time   CHOL 118 09/22/2022 0055   CHOL 125 08/03/2021 0850   TRIG 84 09/22/2022 0055   HDL 59 09/22/2022 0055   HDL 64 08/03/2021 0850   CHOLHDL 2.0 09/22/2022 0055   VLDL 17 09/22/2022 0055   LDLCALC 42 09/22/2022 0055   LDLCALC 41 08/03/2021 0850   LDLDIRECT 105 09/19/2016 4098  Wt Readings from Last 3  Encounters:  04/11/23 147 lb 9.6 oz (67 kg)  01/16/23 150 lb (68 kg)  10/06/22 147 lb 9.6 oz (67 kg)          No data to display            ASSESSMENT AND PLAN:  1.  Peripheral arterial disease: Stat is post atherectomy and drug-coated balloon angioplasty to the left SFA last year with excellent results.  He finished 6 months of clopidogrel which will be stopped today.  Continue aspirin indefinitely.  Repeat Doppler studies.  He does have palpable pulses on the left but not the right.  He reports stable symptoms of right calf claudication and thus we will continue medical therapy for now.    2.  Coronary artery disease involving native coronary arteries without angina: He seems to be doing well from a cardiac standpoint.  3.  Hyperlipidemia: He is intolerant to statin but doing well with Repatha with most recent LDL of 42.  4.  Essential hypertension: His blood pressure was rechecked and it was still high.  I elected to increase carvedilol to 25 mg twice daily.  Continue hydralazine 100 mg twice daily and consider adding an ARB with close monitoring of renal function.  5.  Carotid artery disease: Most recent carotid Doppler in March showed 60 to 79% right carotid stenosis and 40 to 59% left carotid stenosis.  Repeat studies in 1 year.  6.  Stage III chronic kidney disease: Seems to be stable.  7.  Tobacco use: I again discussed with him the importance of smoking cessation but he reports inability to quit.    Disposition:   Follow-up in 6 months.  Signed,  Lorine Bears, MD  04/11/2023 9:00 AM    [facility]

## 2023-04-11 NOTE — Patient Instructions (Signed)
Medication Instructions:  STOP the Plavix  INCREASE the Carvedilol to 25 mg twice daily  *If you need a refill on your cardiac medications before your next appointment, please call your pharmacy*   Lab Work: None ordered If you have labs (blood work) drawn today and your tests are completely normal, you will receive your results only by: MyChart Message (if you have MyChart) OR A paper copy in the mail If you have any lab test that is abnormal or we need to change your treatment, we will call you to review the results.   Testing/Procedures: Your physician has requested that you have a lower extremity arterial duplex. During this test, ultrasound is used to evaluate arterial blood flow in the legs. Allow one hour for this exam. There are no restrictions or special instructions. This will take place at [address] [facility] [address] [facility], [address].  Your physician has requested that you have an ankle brachial index (ABI). During this test an ultrasound and blood pressure cuff are used to evaluate the arteries that supply the arms and legs with blood. Allow thirty minutes for this exam. There are no restrictions or special instructions. This will take place at [address], [address].    Follow-Up: At [facility], you and your health needs are our priority.  As part of our continuing mission to provide you with exceptional heart care, we have created designated Provider Care Teams.  These Care Teams include your primary Cardiologist (physician) and Advanced Practice Providers (APPs -  Physician Assistants and Nurse Practitioners) who all work together to provide you with the care you need, when you need it.  We recommend signing up for the patient portal called "MyChart".  Sign up information is provided on this After Visit Summary.  MyChart is used to connect with patients for Virtual Visits (Telemedicine).  Patients are able to view lab/test results, encounter notes, upcoming appointments,  etc.  Non-urgent messages can be sent to your provider as well.   To learn more about what you can do with MyChart, go to [email or web address].    Your next appointment:   6 month(s)  Provider:   Dr. Kirke Corin

## 2023-04-12 ENCOUNTER — Other Ambulatory Visit: Payer: Self-pay | Admitting: Family Medicine

## 2023-04-12 DIAGNOSIS — E1165 Type 2 diabetes mellitus with hyperglycemia: Secondary | ICD-10-CM

## 2023-04-12 MED ORDER — HYDROCODONE-ACETAMINOPHEN 7.5-325 MG PO TABS: 1.0000 | ORAL_TABLET | Freq: Three times a day (TID) | ORAL | 0 refills | Status: DC | PRN

## 2023-04-12 NOTE — Telephone Encounter (Signed)
[facility] before further refills  

## 2023-04-13 ENCOUNTER — Encounter: Payer: Self-pay | Admitting: Family Medicine

## 2023-04-13 ENCOUNTER — Ambulatory Visit (INDEPENDENT_AMBULATORY_CARE_PROVIDER_SITE_OTHER): Payer: Medicare HMO | Admitting: Family Medicine

## 2023-04-13 VITALS — BP 146/60 | HR 66 | Ht 71.0 in | Wt 150.2 lb

## 2023-04-13 DIAGNOSIS — Z79899 Other long term (current) drug therapy: Secondary | ICD-10-CM

## 2023-04-13 DIAGNOSIS — E1151 Type 2 diabetes mellitus with diabetic peripheral angiopathy without gangrene: Secondary | ICD-10-CM | POA: Diagnosis not present

## 2023-04-13 DIAGNOSIS — G720 Drug-induced myopathy: Secondary | ICD-10-CM

## 2023-04-13 DIAGNOSIS — I152 Hypertension secondary to endocrine disorders: Secondary | ICD-10-CM

## 2023-04-13 DIAGNOSIS — E1159 Type 2 diabetes mellitus with other circulatory complications: Secondary | ICD-10-CM

## 2023-04-13 DIAGNOSIS — E0590 Thyrotoxicosis, unspecified without thyrotoxic crisis or storm: Secondary | ICD-10-CM

## 2023-04-13 DIAGNOSIS — Z1211 Encounter for screening for malignant neoplasm of colon: Secondary | ICD-10-CM | POA: Diagnosis not present

## 2023-04-13 DIAGNOSIS — F17210 Nicotine dependence, cigarettes, uncomplicated: Secondary | ICD-10-CM

## 2023-04-13 DIAGNOSIS — N1832 Chronic kidney disease, stage 3b: Secondary | ICD-10-CM | POA: Diagnosis not present

## 2023-04-13 DIAGNOSIS — E11319 Type 2 diabetes mellitus with unspecified diabetic retinopathy without macular edema: Secondary | ICD-10-CM

## 2023-04-13 DIAGNOSIS — T466X5A Adverse effect of antihyperlipidemic and antiarteriosclerotic drugs, initial encounter: Secondary | ICD-10-CM

## 2023-04-13 DIAGNOSIS — M48062 Spinal stenosis, lumbar region with neurogenic claudication: Secondary | ICD-10-CM

## 2023-04-13 DIAGNOSIS — E875 Hyperkalemia: Secondary | ICD-10-CM

## 2023-04-13 DIAGNOSIS — N1831 Chronic kidney disease, stage 3a: Secondary | ICD-10-CM | POA: Diagnosis not present

## 2023-04-13 DIAGNOSIS — Z794 Long term (current) use of insulin: Secondary | ICD-10-CM

## 2023-04-13 LAB — POCT GLYCOSYLATED HEMOGLOBIN (HGB A1C): HbA1c, POC (controlled diabetic range): 10.8 % — AB (ref 0.0–7.0)

## 2023-04-13 MED ORDER — EMPAGLIFLOZIN 10 MG PO TABS
10.0000 mg | ORAL_TABLET | Freq: Every day | ORAL | 3 refills | Status: DC
Start: 2023-04-13 — End: 2023-05-04

## 2023-04-13 NOTE — Progress Notes (Signed)
Colin Keller is alone Sources of clinical information for visit is/are patient. Nursing assessment for this office visit was reviewed with the patient for accuracy and revision.     Previous Report(s) Reviewed: Consult notes cardiology 04/11/23     04/13/2023    9:39 AM  Depression screen PHQ 2/9  Decreased Interest 0  Down, Depressed, Hopeless 0  PHQ - 2 Score 0  Altered sleeping 3  Tired, decreased energy 2  Change in appetite 0  Feeling bad or failure about yourself  1  Trouble concentrating 1  Moving slowly or fidgety/restless 0  Suicidal thoughts 0  PHQ-9 Score 7   Flowsheet Row Office Visit from 04/13/2023 in [facility] Office Visit from 06/16/2022 in [facility] Office Visit from 05/12/2022 in [facility]  Thoughts that you would be better off dead, or of hurting yourself in some way Not at all Not at all Not at all  PHQ-9 Total Score 7 12 9           04/13/2023    8:55 AM 06/16/2022   10:08 AM 05/12/2022   10:09 AM 10/14/2021    9:45 AM 04/21/2021   10:11 AM  Fall Risk   Falls in the past year? 0 1 0 1 0  Number falls in past yr: 0 0 0 0   Injury with Fall? 0  0 0   Follow up     Falls prevention discussed       04/13/2023    9:39 AM 06/16/2022   10:07 AM 05/12/2022   10:03 AM  PHQ9 SCORE ONLY  PHQ-9 Total Score 7 12 9     There are no preventive care reminders to display for this patient.  Health Maintenance Due  Topic Date Due   Zoster Vaccines- Shingrix (1 of 2) Never done   COLONOSCOPY (Pts 45-8yrs Insurance coverage will need to be confirmed)  Never done   Lung Cancer Screening  Never done   OPHTHALMOLOGY EXAM  06/21/2018   COLON CANCER SCREENING ANNUAL FOBT  01/22/2019   Medicare Annual Wellness (AWV)  04/21/2022   COVID-19 Vaccine (4 - 2023-24 season) 08/12/2022      History/P.E. limitations: none  There are no preventive care reminders to display for this patient.  Diabetes Health  Maintenance Due  Topic Date Due   OPHTHALMOLOGY EXAM  06/21/2018   FOOT EXAM  06/17/2023   HEMOGLOBIN A1C  10/14/2023    Health Maintenance Due  Topic Date Due   Zoster Vaccines- Shingrix (1 of 2) Never done   COLONOSCOPY (Pts 45-60yrs Insurance coverage will need to be confirmed)  Never done   Lung Cancer Screening  Never done   OPHTHALMOLOGY EXAM  06/21/2018   COLON CANCER SCREENING ANNUAL FOBT  01/22/2019   Medicare Annual Wellness (AWV)  04/21/2022   COVID-19 Vaccine (4 - 2023-24 season) 08/12/2022     Chief Complaint  Patient presents with   Diabetes   Hypertension     --------------------------------------------------------------------------------------------------------------------------------------------- Visit Problem List with A/P  CKD stage G3b/A3, GFR 30-44 and albumin creatinine ratio >300 mg/g (HCC) Established problem worsened.     Latest Ref Rng & Units 04/13/2023   12:09 PM 09/22/2022   12:55 AM 09/06/2022    9:09 AM  BMP  Glucose 70 - 99 mg/dL 657  846  962   BUN 8 - 27 mg/dL 42  25  31   Creatinine 0.76 - 1.27 mg/dL 9.52  1.40  1.63   BUN/Creat Ratio 10 - 24 25   19    Sodium 134 - 144 mmol/L 135  136  137   Potassium 3.5 - 5.2 mmol/L 5.6  4.1  5.5   Chloride 96 - 106 mmol/L 101  108  100   CO2 20 - 29 mmol/L 19  21  26    Calcium 8.6 - 10.2 mg/dL 9.0  8.6  9.7   Estimated Creatinine Clearance: 39.8 mL/min (A) (by C-G formula based on SCr of 1.71 mg/dL (H)).   04/13/23 Albumin 3.9   Phosphorus 3.4  Kidney Failure Risk Calculator = 22 points ==> 29% risk Kidney failure in next 5 years  Needs better BP control Will start empagliflozin at 10 mg daily  Referral to nephrology for evaluation and treatment of patient's CKD    Hypertension associated with diabetes (HCC) Today's Vitals   04/13/23 0854 04/13/23 0938  BP: (!) 159/69 (!) 146/60  Pulse: 66   SpO2: 100%   Weight: 150 lb 3.2 oz (68.1 kg)   Height: 5\' 11"  (1.803 m)   PainSc: 7    PainLoc:  Generalized    Body mass index is 20.95 kg/m.  Established problem Uncontrolled.  Patient is not at goal of BP < 130/80. Mr Yeates is resistant to starting a new antihypertensive medication Asked him to spread out his Hydralazine 50 mg tablets from 100 mg twice a day to 50 mg four times a day  Starting empagliflozin 10 mg daily which may help with BP some.  Will titrate to 25 mg next visit in a month. Given his elevated potassium now and previously, I am hesitant to add ACEi/ARB. He was intolerant of leg edema with amlodipine Non-DHPs, Clonidine and alpha-2 blockers are options in future RTC 1 month    DM (diabetes mellitus), type 2 with peripheral vascular complications (HCC) Established problem Lab Results  Component Value Date   HGBA1C 10.8 (A) 04/13/2023    Uncontrolled.  Patient is not at goal of A1c < 8.0 initially.  Mr Whan did not follow up on the endocrinology referral from last July.  Patient having great variability in his CGM's glucose measurements. Going from 50s (with autonomic symptoms) into the 300s.  He is taking glargine 12-14 units in morning and Humalog 2-4 units in morning if capillary blood glucose > 250.  I am hesitant to work with Mr Artiga's insulin regiment given his apparent brittle diabetes. He was advised to eat snacks throughout the day.  He should not go more than 4 hours without eating something.  Improving his glycemic control is essential to an chance of Mr Wambach avoiding further manifestation of his coronary and peripheral artery disease, as well as avoiding ESRD. Mr Lysaght was made aware of the importance of him controlling his diabetes mellitus, BP, Cholesterol, stopping smoking, etc. Start: Empagliflozin 10 mg and titrate Referral to Endocrinology RTC one month   Diabetic retinopathy (HCC) Established problem Mr Beare did not go to the ophthalmology referral Will discuss with him again at f/u in one month  Heavy smoker (more than 20  cigarettes per day) Established problem Contemplative but does not believe he could reduce or stop smoking Will continue to attempt motivational methods  Referral for LD Lung CT CA screening made  Spinal stenosis, lumbar region with neurogenic claudication Mr Finkel did not go to see the Neurosurgeon as referred by Neurology His sxs are stable.  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  I asked patient to increase fluid intake and increase Humalog to twice and day, then return for recheck Basic Metabolic Panel next week. If persistent increase K+ will start use of Lokelma.

## 2023-04-13 NOTE — Patient Instructions (Signed)
Your blood suage is 10.8%, your goal would be less than 7.5% ideally, but would be happy if we can get you to less than 8.5%.   Your blood sugars bounce quickly from one end to the other.  This is called brittle diabetes.  Brittle diabetes is best handfles by an Endocrinologist. A referral has been made to Endocirinologist to help your diabetes management.   To give you full dayblood pressure control, please take one 50 mg hydralazine tablet four times a day, instead of two tablets twice a day.  Hydralazine lasts for 6 hours after each dose.    We are checking your kidney function today.  Your risk of developing end stage renal disease in the next 5 years is about 33%.  A referral to [facility] has been made to help protect your kidneys.    A medication that can help protect your kidneys, lower your blood pressure and lower your blood sugar is empagliflozin (Jardiance).  Take one tablet a day, in the morning.  It will increase how much sugar you pee out.  You may notice you are peeing out more than ususal.     A Lung CAT scan has been ordered to screen for lung cancer.  You should be called within the next 5 business day to schedule the CAT scan.

## 2023-04-14 ENCOUNTER — Telehealth: Payer: Self-pay

## 2023-04-14 ENCOUNTER — Other Ambulatory Visit (HOSPITAL_COMMUNITY): Payer: Self-pay

## 2023-04-14 ENCOUNTER — Encounter: Payer: Self-pay | Admitting: Family Medicine

## 2023-04-14 ENCOUNTER — Telehealth: Payer: Self-pay | Admitting: Family Medicine

## 2023-04-14 DIAGNOSIS — E875 Hyperkalemia: Secondary | ICD-10-CM

## 2023-04-14 DIAGNOSIS — N1832 Chronic kidney disease, stage 3b: Secondary | ICD-10-CM

## 2023-04-14 LAB — RENAL FUNCTION PANEL
Albumin: 3.9 g/dL (ref 3.9–4.9)
BUN/Creatinine Ratio: 25 — ABNORMAL HIGH (ref 10–24)
BUN: 42 mg/dL — ABNORMAL HIGH (ref 8–27)
CO2: 19 mmol/L — ABNORMAL LOW (ref 20–29)
Calcium: 9 mg/dL (ref 8.6–10.2)
Chloride: 101 mmol/L (ref 96–106)
Creatinine, Ser: 1.71 mg/dL — ABNORMAL HIGH (ref 0.76–1.27)
Glucose: 327 mg/dL — ABNORMAL HIGH (ref 70–99)
Phosphorus: 3.4 mg/dL (ref 2.8–4.1)
Potassium: 5.6 mmol/L — ABNORMAL HIGH (ref 3.5–5.2)
Sodium: 135 mmol/L (ref 134–144)
eGFR: 43 mL/min/{1.73_m2} — ABNORMAL LOW (ref >59)

## 2023-04-14 LAB — CBC WITH DIFFERENTIAL/PLATELET
Basophils Absolute: 0.1 10*3/uL (ref 0.0–0.2)
Basos: 1 %
EOS (ABSOLUTE): 0.3 10*3/uL (ref 0.0–0.4)
Eos: 3 %
Hematocrit: 29.4 % — ABNORMAL LOW (ref 37.5–51.0)
Hemoglobin: 9.7 g/dL — ABNORMAL LOW (ref 13.0–17.7)
Immature Grans (Abs): 0 10*3/uL (ref 0.0–0.1)
Immature Granulocytes: 0 %
Lymphocytes Absolute: 1.8 10*3/uL (ref 0.7–3.1)
Lymphs: 21 %
MCH: 31.6 pg (ref 26.6–33.0)
MCHC: 33 g/dL (ref 31.5–35.7)
MCV: 96 fL (ref 79–97)
Monocytes Absolute: 0.7 10*3/uL (ref 0.1–0.9)
Monocytes: 8 %
Neutrophils Absolute: 5.8 10*3/uL (ref 1.4–7.0)
Neutrophils: 67 %
Platelets: 238 10*3/uL (ref 150–450)
RBC: 3.07 x10E6/uL — ABNORMAL LOW (ref 4.14–5.80)
RDW: 13.7 % (ref 11.6–15.4)
WBC: 8.7 10*3/uL (ref 3.4–10.8)

## 2023-04-14 LAB — TSH RFX ON ABNORMAL TO FREE T4: TSH: 1.5 u[IU]/mL (ref 0.450–4.500)

## 2023-04-14 NOTE — Assessment & Plan Note (Signed)
Established problem Lab Results  Component Value Date   HGBA1C 10.8 (A) 04/13/2023    Uncontrolled.  Patient is not at goal of A1c < 8.0 initially.  Colin Keller did not follow up on the endocrinology referral from last July.  Patient having great variability in his CGM's glucose measurements. Going from 50s (with autonomic symptoms) into the 300s.  He is taking glargine 12-14 units in morning and Humalog 2-4 units in morning if capillary blood glucose > 250.  I am hesitant to work with Colin Keller's insulin regiment given his apparent brittle diabetes. He was advised to eat snacks throughout the day.  He should not go more than 4 hours without eating something.  Improving his glycemic control is essential to an chance of Colin Keller avoiding further manifestation of his coronary and peripheral artery disease, as well as avoiding ESRD. Colin Keller was made aware of the importance of him controlling his diabetes mellitus, BP, Cholesterol, stopping smoking, etc. Start: Empagliflozin 10 mg and titrate Referral to Endocrinology RTC one month

## 2023-04-14 NOTE — Telephone Encounter (Signed)
Patient calls nurse line in regards to [place].   He reports the medication is too expensive for him. He reports the pharmacist suggested trying a manufacture coupon. He reports no idea how to obtain this.   I attempted to do one online for him, however the site stated he was not eligible for coupon.   Will forward to pharmacy team for assistance.

## 2023-04-14 NOTE — Assessment & Plan Note (Signed)
Established problem Colin Keller did not go to the ophthalmology referral Will discuss with him again at f/u in one month

## 2023-04-14 NOTE — Assessment & Plan Note (Addendum)
Established problem worsened.     Latest Ref Rng & Units 04/13/2023   12:09 PM 09/22/2022   12:55 AM 09/06/2022    9:09 AM  BMP  Glucose 70 - 99 mg/dL 308  657  846   BUN 8 - 27 mg/dL 42  25  31   Creatinine 0.76 - 1.27 mg/dL 9.62  9.52  8.41   BUN/Creat Ratio 10 - 24 25   19    Sodium 134 - 144 mmol/L 135  136  137   Potassium 3.5 - 5.2 mmol/L 5.6  4.1  5.5   Chloride 96 - 106 mmol/L 101  108  100   CO2 20 - 29 mmol/L 19  21  26    Calcium 8.6 - 10.2 mg/dL 9.0  8.6  9.7   Estimated Creatinine Clearance: 39.8 mL/min (A) (by C-G formula based on SCr of 1.71 mg/dL (H)).   04/13/23 Albumin 3.9   Phosphorus 3.4  Kidney Failure Risk Calculator = 22 points ==> 29% risk Kidney failure in next 5 years  Needs better BP control Will start empagliflozin at 10 mg daily  Referral to nephrology for evaluation and treatment of patient's CKD

## 2023-04-14 NOTE — Telephone Encounter (Signed)
I spoke with Colin Keller about his elevated potassium level which he has had before. I asked him to increase his humalog insulin to at least twice a day and to increase his fluid intake, especially when he is out working.  Colin Keller said he soul come in next week for a recheck of his potassium.  If hyperkalemia persists, then may need to start [facility].   I asked him to contact cardiologist preop evaluation for the teeth extraction under sedation.  Lab placed for repeat Basic Metabolic Panel next week.  Will ask [facility] to schedule patient for a lab visit.

## 2023-04-14 NOTE — Telephone Encounter (Signed)
Spoke with patient regarding Jardiance copay. Gave him [facility] phone number and he will be applying for LIS in the meantime. Pt also says if medication is $45 he may can swing picking that up in the meantime if he needs to. Pt will give me a call back once he gets a decision from [place].

## 2023-04-14 NOTE — Assessment & Plan Note (Addendum)
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  I asked patient to increase fluid intake and increase Humalog to twice and day, then return for recheck Basic Metabolic Panel next week. If persistent increase K+ will start use of Lokelma.

## 2023-04-14 NOTE — Assessment & Plan Note (Signed)
Mr Rexroth did not go to see the Neurosurgeon as referred by Neurology His sxs are stable.  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

## 2023-04-14 NOTE — Telephone Encounter (Signed)
I spoke with Colin Keller to request preop evaluation from his cardiologist for the dental extractions.

## 2023-04-14 NOTE — Assessment & Plan Note (Signed)
Patient intolerant of several statins in past  He is tolerating Repatha from the [facility] with good LDL reduction

## 2023-04-14 NOTE — Assessment & Plan Note (Signed)
Today's Vitals   04/13/23 0854 04/13/23 0938  BP: (!) 159/69 (!) 146/60  Pulse: 66   SpO2: 100%   Weight: 150 lb 3.2 oz (68.1 kg)   Height: 5\' 11"  (1.803 m)   PainSc: 7    PainLoc: Generalized    Body mass index is 20.95 kg/m.  Established problem Uncontrolled.  Patient is not at goal of BP < 130/80. Colin Keller is resistant to starting a new antihypertensive medication Asked him to spread out his Hydralazine 50 mg tablets from 100 mg twice a day to 50 mg four times a day  Starting empagliflozin 10 mg daily which may help with BP some.  Will titrate to 25 mg next visit in a month. Given his elevated potassium now and previously, I am hesitant to add ACEi/ARB. He was intolerant of leg edema with amlodipine Non-DHPs, Clonidine and alpha-2 blockers are options in future RTC 1 month

## 2023-04-14 NOTE — Telephone Encounter (Signed)
Lori calls nurse line in regards to dental form.   She reports [facility] will be faxing over clearance.  She reports he is getting a full extraction and dentures.   Form is in PCP box.

## 2023-04-14 NOTE — Assessment & Plan Note (Signed)
Established problem Contemplative but does not believe he could reduce or stop smoking Will continue to attempt motivational methods  Referral for LD Lung CT CA screening made

## 2023-04-17 ENCOUNTER — Telehealth: Payer: Self-pay

## 2023-04-17 NOTE — Telephone Encounter (Signed)
   Pre-operative Risk Assessment    Patient Name: Colin Keller  DOB: 1956-05-09 MRN: [id number]     Request for Surgical Clearance    Procedure:  Dental Extraction - Amount of Teeth to be Pulled:  5 surgical extractions, Alveo upper arch with moderate conscious sedation   Date of Surgery:  Clearance TBD                                 Surgeon:  Alberteen Spindle DDS  Surgeon's Group or Practice Name:  [facility] Phone number:  [phone] Fax number:  [phone]   Type of Clearance Requested:   - Medical  - Pharmacy:  Hold Aspirin Needs instructions    Type of Anesthesia:   Moderate Conscious sedation    Additional requests/questions:   Request list of medications   Signed, Dorris Fetch   04/17/2023, 5:12 PM

## 2023-04-17 NOTE — Telephone Encounter (Signed)
Scheduled pt medication f/u for May 23rd @830 

## 2023-04-17 NOTE — Telephone Encounter (Signed)
Attempted to reach patient. No answer. LVM for patient to call the office to make lab only appt. Aquilla Solian, CMA

## 2023-04-18 NOTE — Telephone Encounter (Signed)
Dr. Kirke Corin,  You saw this patient on 04/11/2023. Will you please comment on medical clearance for extraction of 5 teeth under moderate sedation? Okay to hold aspirin for 5 days prior s/p atherectomy and DCB angioplasty of left SFA 09/21/2022?   Please route your response to P CV DIV Preop. I will communicate with requesting office once you have given recommendations.   Thank you!  Carlos Levering, NP

## 2023-04-20 NOTE — Telephone Encounter (Signed)
He is at low risk from a cardiac standpoint but he should not hold the aspirin.

## 2023-04-20 NOTE — Telephone Encounter (Signed)
   Patient Name: Colin Keller  DOB: 1956-02-25 MRN: [id number]  Primary Cardiologist: Olga Millers, MD  Chart reviewed as part of pre-operative protocol coverage. Pre-op clearance already addressed by colleagues in earlier phone notes. To summarize recommendations:  -He is at low risk from a cardiac standpoint but he should not hold the aspirin.  -Dr. Kirke Corin  Will route this bundled recommendation to requesting provider via Epic fax function and remove from pre-op pool. Please call with questions.  Sharlene Dory, PA-C 04/20/2023, 1:06 PM

## 2023-04-21 ENCOUNTER — Ambulatory Visit (HOSPITAL_COMMUNITY)
Admission: RE | Admit: 2023-04-21 | Discharge: 2023-04-21 | Disposition: A | Discharge status: Home / Self Care | Payer: Medicare HMO | Source: Ambulatory Visit | Attending: Cardiovascular Disease | Admitting: Cardiovascular Disease

## 2023-04-21 DIAGNOSIS — I739 Peripheral vascular disease, unspecified: Secondary | ICD-10-CM | POA: Insufficient documentation

## 2023-04-21 LAB — VAS US ABI WITH/WO TBI
Left ABI: 0.96
Right ABI: 0.63

## 2023-04-26 ENCOUNTER — Telehealth: Payer: Self-pay | Admitting: Cardiovascular Disease

## 2023-04-26 DIAGNOSIS — I739 Peripheral vascular disease, unspecified: Secondary | ICD-10-CM

## 2023-04-26 NOTE — Telephone Encounter (Signed)
Patient is returning RN's call for his Lower Arterial results.   Please advise.

## 2023-04-26 NOTE — Telephone Encounter (Signed)
Pt made aware of results along with MD's recommendations Pt verbalized understanding Repeat orders placed for [facility] location.    Iran Ouch, MD 04/21/2023  5:18 PM EDT     The left side with patent left SFA.  Repeat studies in 1 year.

## 2023-04-27 NOTE — Telephone Encounter (Signed)
Patient notified and states he will contact [facility] shortly because they are closed tomorrow.  He thanked me for my help and returning his call.

## 2023-04-27 NOTE — Telephone Encounter (Signed)
Patient wants confirmation of his clearance for dental work sent to his dentist's office - [facility].  Patient noted dental office is closed on Fridays.

## 2023-04-27 NOTE — Telephone Encounter (Signed)
Contacted [facility] to see if they have received clearance.   Due to them not receiving clearance; clearance letter has been refaxed.

## 2023-05-03 ENCOUNTER — Encounter: Payer: Self-pay | Admitting: Family Medicine

## 2023-05-03 DIAGNOSIS — G729 Myopathy, unspecified: Secondary | ICD-10-CM | POA: Insufficient documentation

## 2023-05-04 ENCOUNTER — Encounter: Payer: Self-pay | Admitting: Family Medicine

## 2023-05-04 ENCOUNTER — Ambulatory Visit (INDEPENDENT_AMBULATORY_CARE_PROVIDER_SITE_OTHER): Payer: Medicare HMO | Admitting: Family Medicine

## 2023-05-04 VITALS — BP 167/64 | HR 70 | Ht 71.0 in | Wt 143.0 lb

## 2023-05-04 DIAGNOSIS — E1159 Type 2 diabetes mellitus with other circulatory complications: Secondary | ICD-10-CM | POA: Diagnosis not present

## 2023-05-04 DIAGNOSIS — N1832 Chronic kidney disease, stage 3b: Secondary | ICD-10-CM | POA: Diagnosis not present

## 2023-05-04 DIAGNOSIS — G720 Drug-induced myopathy: Secondary | ICD-10-CM

## 2023-05-04 DIAGNOSIS — E1151 Type 2 diabetes mellitus with diabetic peripheral angiopathy without gangrene: Secondary | ICD-10-CM | POA: Diagnosis not present

## 2023-05-04 DIAGNOSIS — T466X5A Adverse effect of antihyperlipidemic and antiarteriosclerotic drugs, initial encounter: Secondary | ICD-10-CM

## 2023-05-04 DIAGNOSIS — I152 Hypertension secondary to endocrine disorders: Secondary | ICD-10-CM

## 2023-05-04 MED ORDER — HYDRALAZINE HCL 50 MG PO TABS
50.0000 mg | ORAL_TABLET | Freq: Three times a day (TID) | ORAL | 3 refills | Status: DC
Start: 2023-05-04 — End: 2024-06-11

## 2023-05-04 MED ORDER — EMPAGLIFLOZIN 25 MG PO TABS
25.0000 mg | ORAL_TABLET | Freq: Every day | ORAL | 3 refills | Status: DC
Start: 2023-05-04 — End: 2023-10-05

## 2023-05-04 NOTE — Patient Instructions (Addendum)
After Visit Summary  It was great to see you today.  Please schedule an appointment with the kidney doctor. We are checking your kidney labs today.  We are increasing your Jardiance to 25 mg per day.  Prescribing your hydralazine in 50 mg pills to be taken 4 times per day.  Follow up in 1 month.

## 2023-05-04 NOTE — Assessment & Plan Note (Signed)
Taking hydralazine 100 mg bid, carvedilol 25 mg bid, and Jardiance 12.5 mg pid. BP is poorly controlled at 167/64 on repeat. He was unable to split his 100 mg hydralazine pills. Sent in 50 mg hydralazine pills to be taken qid. Increased Jardiance to 25 mg daily. Follow up in 1 month.

## 2023-05-04 NOTE — Progress Notes (Signed)
      SUBJECTIVE:   CHIEF COMPLAINT / HPI:   Colin Keller is a 67 yo male who presents to the clinic for follow up on DM2, HTN, and CKD. Also complained about allergy symptoms recently.  Type II Diabetes Currently taking Metformin 1000 mg bid, Glargine 12-14 units daily, lispro 3-5 units bid, and Jardiance 12.5 mg daily. He reports compliance to medications with no adverse effects. Last A1c on 04/13/23 was 10.8. His morning sugar was 148 today on his CGM. He reports sugars in the 50s 1-2 times per week during the day after taking his lispro. Recommended scheduling with endo at last visit but he has not done this yet.  HTN Taking hydralazine 100 mg bid and carvedilol 25 mg bid. Instructed to take 50 mg qid but he is not able to split his pills. BP today was 170/65 and 167/64 on initial and repeat. Denies chest pain.  CKD Renal function panel on 04/13/23 showed Creatinine of 1.71, BUN of 42, and K of 5.6. Recommended patient see nephro at last visit but he has not scheduled appt yet.  Allergies Patient does a lot of work outside and has had allergy symptoms recently. He has taken allergy medication which he thinks is Zyrtec. His symptoms are now improving.  PERTINENT  PMH / PSH: HTN, DM2, HFmrEF, PAD, diabetic retinopathy, CKD stage G3b  OBJECTIVE:   BP (!) 167/64   Pulse 70   Ht 5\' 11"  (1.803 m)   Wt 143 lb (64.9 kg)   SpO2 100%   BMI 19.94 kg/m    Gen: alert, well appearing, in no acute distress HEENT: normocephalic, atraumatic CV: RRR, no MRG Pulm: normal WOB, clear to auscultation bilaterally Ab: normoactive bowel sounds, no tenderness or masses Ext: 1+ lower extremity edema to mid tibia   ASSESSMENT/PLAN:   Hypertension associated with diabetes (HCC) Taking hydralazine 100 mg bid, carvedilol 25 mg bid, and Jardiance 12.5 mg pid. BP is poorly controlled at 167/64 on repeat. He was unable to split his 100 mg hydralazine pills. Sent in 50 mg hydralazine pills to be taken qid.  Increased Jardiance to 25 mg daily. Follow up in 1 month.  DM (diabetes mellitus), type 2 with peripheral vascular complications (HCC) Currently taking Metformin 1000 mg bid, Glargine 12-14 units daily, lispro 3-5 units bid, and Jardiance 12.5 mg daily. Last A1c on 5/2 was 10.8. Started Jardiance at that time and he has had no adverse effects. Increasing Jardiance to 25 mg pid today. Counseled on bringing snacks if he has low sugars throughout the day. Encouraged to schedule appointment with endocrinology. Follow up in 1 month.  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 Recommended patient see nephro at last visit but he has not scheduled appt yet. Rechecking renal panel today. Encouraged to schedule with nephrology ASAP and patient said he would call today. Follow up in 1 month.  Allergies Allergy symptoms are now improving with Zyrtec. Continue taking as needed. If symptoms do not resolve can also use Flonase. Follow up as needed.   Health Maintenance - has not seen eye doctor in many year. Encouraged patient to schedule appointment with ophthalmology. - patient has been smoking less recently. Says he wants to quit someday but not currently ready. - scheduling appointment with dentist today.  Barrett Shell, Medical Student [facility]

## 2023-05-04 NOTE — Assessment & Plan Note (Signed)
Renal function panel on 04/13/23 showed Creatinine of 1.71, BUN of 42, and K of 5.6. Recommended patient see nephro at last visit but he has not scheduled appt yet. Rechecking renal panel today. Encouraged to schedule with nephrology ASAP and patient said he would call today. Follow up in 1 month.

## 2023-05-04 NOTE — Assessment & Plan Note (Addendum)
Currently taking Metformin 1000 mg bid, Glargine 12-14 units daily, lispro 3-5 units bid, and Jardiance 12.5 mg daily. Last A1c on 5/2 was 10.8. Started Jardiance at that time and he has had no adverse effects. Increasing Jardiance to 25 mg pid today. Counseled on bringing snacks if he has low sugars throughout the day. Encouraged to schedule appointment with endocrinology. Follow up in 1 month.

## 2023-05-05 ENCOUNTER — Encounter: Payer: Self-pay | Admitting: Family Medicine

## 2023-05-05 LAB — RENAL FUNCTION PANEL
Albumin: 3.9 g/dL (ref 3.9–4.9)
BUN/Creatinine Ratio: 20 (ref 10–24)
BUN: 40 mg/dL — ABNORMAL HIGH (ref 8–27)
CO2: 20 mmol/L (ref 20–29)
Calcium: 9.2 mg/dL (ref 8.6–10.2)
Chloride: 104 mmol/L (ref 96–106)
Creatinine, Ser: 1.98 mg/dL — ABNORMAL HIGH (ref 0.76–1.27)
Glucose: 348 mg/dL — ABNORMAL HIGH (ref 70–99)
Phosphorus: 4.1 mg/dL (ref 2.8–4.1)
Potassium: 5.2 mmol/L (ref 3.5–5.2)
Sodium: 139 mmol/L (ref 134–144)
eGFR: 36 mL/min/{1.73_m2} — ABNORMAL LOW (ref >59)

## 2023-05-05 NOTE — Progress Notes (Signed)
I saw the patient with Colin Keller. I agree with the finding and plans dosumented in his note.    Colin Keller has not scheduled an appointment with Endocrinologist as recommended on rcent two office visits.  Recommended again.  Will likely need to involve Colin Keller to help with his uncontrolled DMT2.  Agree with increase in empagliflozin to 25 mg daily.  This may help somewhat with his uncontrolled hypertension.    Facillitating Colin Keller taking hydralazine 50 mg four times a day by prescribing 50 mg tablets instead of 100 mg tabs.    Colin Keller's serum creatinine has risen 0.3 mg/dl since a month ago.  I suspect this is from starting empagliflozin. Will monitor serum creatinine with this next increase in the medication dose.    Colin Keller was strenuously encouraged to contact [facility] to schedule an initial appointment.    I will see patient back in a month.

## 2023-05-09 ENCOUNTER — Other Ambulatory Visit: Payer: Self-pay

## 2023-05-09 DIAGNOSIS — M4800 Spinal stenosis, site unspecified: Secondary | ICD-10-CM

## 2023-05-09 DIAGNOSIS — G8929 Other chronic pain: Secondary | ICD-10-CM

## 2023-05-09 MED ORDER — HYDROCODONE-ACETAMINOPHEN 7.5-325 MG PO TABS
1.0000 | ORAL_TABLET | Freq: Three times a day (TID) | ORAL | 0 refills | Status: DC | PRN
Start: 2023-05-11 — End: 2023-06-06

## 2023-05-11 ENCOUNTER — Telehealth: Payer: Self-pay | Admitting: Cardiovascular Disease

## 2023-05-11 NOTE — Telephone Encounter (Signed)
See 5/06 encounter  Colin Keller with [facility] is requesting to have clearance recommendation re-faxed if possible.  Phone#: [phone] Fax#: [phone]

## 2023-05-11 NOTE — Telephone Encounter (Signed)
Clearance note has been resent.

## 2023-05-16 ENCOUNTER — Telehealth: Payer: Self-pay

## 2023-05-16 NOTE — Telephone Encounter (Signed)
Patient's wife calls nurse line regarding insulin. Patient has been receiving insulin through [facility]. Patient is needing a new application for Humalog and Basaglar.   Will route to [place].   Patient also has had recent dental surgery. States that surgery was performed yesterday. He is recovering well, however, has questions about being prescribed ibuprofen.   Patient is hesitant to take ibuprofen as he was told in the past he should not take this medicine. Per chart review, patient has CKD. Advised to continue his current pain medication as prescribed by Dr. McDiarmid and to hold ibuprofen.   Supportive measures provided and discussed return precautions.   Veronda Prude, RN

## 2023-05-17 NOTE — Telephone Encounter (Signed)
I have reviewed and agree with Ms. Lyn Hollingshead, RN recommendations

## 2023-05-17 NOTE — Telephone Encounter (Signed)
[facility] enrollment ended 12/11/22.  Will mail re-enrollment application to pt's home.

## 2023-05-19 ENCOUNTER — Other Ambulatory Visit: Payer: Medicare HMO

## 2023-06-05 ENCOUNTER — Telehealth: Payer: Self-pay

## 2023-06-05 DIAGNOSIS — G8929 Other chronic pain: Secondary | ICD-10-CM

## 2023-06-05 DIAGNOSIS — M4800 Spinal stenosis, site unspecified: Secondary | ICD-10-CM

## 2023-06-05 NOTE — Telephone Encounter (Signed)
Received VM from wife regarding patient's hydrocodone refill.   They are requesting that refill be sent with fill date of 6/29, as pharmacy will be closed on Sunday, 6/30.  If appropriate, please send to [facility].   Veronda Prude, RN

## 2023-06-06 ENCOUNTER — Other Ambulatory Visit: Payer: Self-pay | Admitting: Family Medicine

## 2023-06-06 DIAGNOSIS — M48062 Spinal stenosis, lumbar region with neurogenic claudication: Secondary | ICD-10-CM

## 2023-06-06 DIAGNOSIS — G8929 Other chronic pain: Secondary | ICD-10-CM

## 2023-06-06 MED ORDER — HYDROCODONE-ACETAMINOPHEN 7.5-325 MG PO TABS
1.0000 | ORAL_TABLET | Freq: Three times a day (TID) | ORAL | 0 refills | Status: DC | PRN
Start: 2023-06-10 — End: 2023-06-07

## 2023-06-06 NOTE — Telephone Encounter (Signed)
Refill sent for HC-APAP 7.5-325 tab, #80 Fill date 06/10/23

## 2023-06-06 NOTE — Telephone Encounter (Signed)
Patient's wife returns call to nurse line.   Pharmacy has hydrocodone 10-325 mg in stock. She canceled prescription at pharmacy for 7.5 mg. Called and verified this with [facility].   They also confirmed that patient picked up a 27 day supply on 05/11/23.  Requesting that new order for hydrocodone acetaminophen 10-325 mg be sent in with an earlier fill date, as patient will be out of medication tomorrow.   Please advise.   Veronda Prude, RN

## 2023-06-07 MED ORDER — HYDROCODONE-ACETAMINOPHEN 10-325 MG PO TABS: 1.0000 | ORAL_TABLET | Freq: Two times a day (BID) | ORAL | 0 refills | Status: DC | PRN

## 2023-06-07 NOTE — Addendum Note (Signed)
Addended by: Acquanetta Belling D on: 06/07/2023 02:47 PM   Modules accepted: Orders

## 2023-06-19 ENCOUNTER — Telehealth: Payer: Self-pay | Admitting: Cardiology

## 2023-06-19 ENCOUNTER — Other Ambulatory Visit (HOSPITAL_COMMUNITY): Payer: Self-pay

## 2023-06-19 NOTE — Telephone Encounter (Signed)
Spoke with patient's wife. [facility] (Dr. Abel Presto) ordered iron infusions - first scheduled 7/11. She would like Dr. Jens Som and Dr. Kirke Corin to review and advise if OK to proceed. She is worried that nephrologist may not be aware of his heart/PAD issues. She said patient has not even taken OTC iron pills before going to infusions. Advised that CBC was 2 months ago and no updated notes received from [facility] but will send to providers for review/advice per her request.

## 2023-06-19 NOTE — Telephone Encounter (Signed)
Patient is anemic and is having iron infusions on 07/11. Patients wife would like to know if there is anything that they should be aware of prior to the infusions. She is not sure why he is not being given iron pills for his anemia and is concerned about this. She is waiting for a call back from his PCP. Please advise.

## 2023-06-20 ENCOUNTER — Telehealth: Payer: Self-pay

## 2023-06-20 NOTE — Telephone Encounter (Signed)
Rec's patients portion back.   PCP pages in providers box.

## 2023-06-20 NOTE — Telephone Encounter (Signed)
Left message for patient with Dr Creshaw's recommendations.   

## 2023-06-20 NOTE — Telephone Encounter (Signed)
Patient's wife calls nurse line regarding concerns for patient having an upcoming iron infusion. This was ordered by [facility].   First feraheme infusion is on 7/11.   Wife has already talked with cardiologist office regarding this. She has questions regarding why patient could not take oral supplementation.   Advised that I was unsure of his most recent labs as I am unable to view [facility]'s records.   Will forward to PCP for further advisement.   Veronda Prude, RN

## 2023-06-22 ENCOUNTER — Encounter (HOSPITAL_COMMUNITY)
Admission: RE | Admit: 2023-06-22 | Discharge: 2023-06-22 | Disposition: A | Discharge status: Home / Self Care | Payer: Medicare HMO | Source: Ambulatory Visit | Attending: Nephrology | Admitting: Nephrology

## 2023-06-22 DIAGNOSIS — N189 Chronic kidney disease, unspecified: Secondary | ICD-10-CM | POA: Insufficient documentation

## 2023-06-22 DIAGNOSIS — D631 Anemia in chronic kidney disease: Secondary | ICD-10-CM | POA: Insufficient documentation

## 2023-06-22 MED ORDER — SODIUM CHLORIDE 0.9 % IV SOLN
510.0000 mg | INTRAVENOUS | Status: DC
  Administered 2023-06-22: 510 mg via INTRAVENOUS
  Filled 2023-06-22: qty 510

## 2023-06-23 NOTE — Telephone Encounter (Signed)
I have not received anything from [facility].

## 2023-06-27 NOTE — Telephone Encounter (Signed)
Submitted RE-ENROLLMENT application for HUMALOG KWIKPEN & BASAGLAR to [employer] for patient assistance.   Phone: [phone]

## 2023-06-29 ENCOUNTER — Ambulatory Visit (HOSPITAL_COMMUNITY)
Admission: RE | Admit: 2023-06-29 | Discharge: 2023-06-29 | Disposition: A | Discharge status: Home / Self Care | Payer: Medicare HMO | Source: Ambulatory Visit | Attending: Nephrology | Admitting: Nephrology

## 2023-06-29 DIAGNOSIS — N189 Chronic kidney disease, unspecified: Secondary | ICD-10-CM | POA: Insufficient documentation

## 2023-06-29 DIAGNOSIS — D631 Anemia in chronic kidney disease: Secondary | ICD-10-CM | POA: Diagnosis present

## 2023-06-29 MED ORDER — SODIUM CHLORIDE 0.9 % IV SOLN
510.0000 mg | INTRAVENOUS | Status: AC
  Administered 2023-06-29: 510 mg via INTRAVENOUS
  Filled 2023-06-29: qty 510

## 2023-06-29 NOTE — Telephone Encounter (Signed)
Received notification from [facility] [employer] regarding approval for [facility] & HUMALOG KWIKPEN. Patient assistance approved from 06/27/23 to 12/12/23.  MEDICATIONS WILL SHIP TO PATIENTS HOME.  Phone: [phone]

## 2023-07-03 ENCOUNTER — Other Ambulatory Visit: Payer: Self-pay | Admitting: Family Medicine

## 2023-07-07 ENCOUNTER — Telehealth: Payer: Self-pay

## 2023-07-07 NOTE — Telephone Encounter (Signed)
Please ask Mr Colin Keller to take one Jardiance 25 mg tablet a day.  The dose was increased from 10 mg to improve his blood sugar control.

## 2023-07-07 NOTE — Telephone Encounter (Signed)
Spoke with Colin Keller.   She was advised of Jardiance increase to 25mg .   Colin Keller was appreciative of call.

## 2023-07-07 NOTE — Telephone Encounter (Signed)
Wife LVM on nurse line in regards to Jardiance dosage.   She reports they picked up Jardiance 25mg  from the pharmacy, however she reports he told her he was supposed to be taking Jardiance 10mg  vs 25mg .   According to the last OV note patient was increased to Jardiance 25mg  in May.   Will forward to PCP for clarification.

## 2023-07-31 ENCOUNTER — Other Ambulatory Visit: Payer: Self-pay | Admitting: Family Medicine

## 2023-08-06 NOTE — Progress Notes (Unsigned)
Subjective:   Colin Keller is a 67 y.o. male who presents for Medicare Annual/Subsequent preventive examination.  Visit Complete: {VISITMETHOD:8455102915}  Patient Medicare AWV questionnaire was completed by the patient on ***; I have confirmed that all information answered by patient is correct and no changes since this date.  Vital Signs: {telehealth vitals:30100}  Review of Systems    ***       Objective:    There were no vitals filed for this visit. There is no height or weight on file to calculate BMI.     05/04/2023    8:30 AM 04/13/2023    8:55 AM 09/21/2022   11:00 PM 09/21/2022    8:11 AM 06/16/2022   10:08 AM 05/12/2022   10:04 AM 10/14/2021    9:45 AM  Advanced Directives  Does Patient Have a Medical Advance Directive? No No No No No No No  Would patient like information on creating a medical advance directive?   No - Patient declined No - Patient declined No - Patient declined No - Patient declined No - Patient declined    Current Medications (verified) Outpatient Encounter Medications as of 08/07/2023  Medication Sig   aspirin 81 MG tablet Take 81 mg by mouth daily.   carvedilol (COREG) 25 MG tablet Take 1 tablet (25 mg total) by mouth 2 (two) times daily with a meal.   Continuous Blood Gluc Sensor (FREESTYLE LIBRE 2 SENSOR) MISC 1 Device by Does not apply route every 14 (fourteen) days.   Cyanocobalamin (VITAMIN B 12 PO) Take 1 tablet by mouth daily.   empagliflozin (JARDIANCE) 25 MG TABS tablet Take 1 tablet (25 mg total) by mouth daily.   Evolocumab (REPATHA SURECLICK) 140 MG/ML SOAJ Inject 140 mg into the skin every 14 (fourteen) days.   famotidine (PEPCID) 20 MG tablet Take 20 mg by mouth daily.   hydrALAZINE (APRESOLINE) 50 MG tablet Take 1 tablet (50 mg total) by mouth 3 (three) times daily.   HYDROcodone-acetaminophen (NORCO) 10-325 MG tablet Take 1 tablet by mouth 2 (two) times daily as needed.   Insulin Glargine (BASAGLAR KWIKPEN) 100 UNIT/ML DIAL AND  INJECT 16 TO 18 UNITS UNDER THE SKIN DAILY. (Patient taking differently: 12-14 Units.)   insulin lispro (HUMALOG) 100 UNIT/ML injection Inject 0.03-0.05 mLs (3-5 Units total) into the skin 2 (two) times daily with a meal. 3-4 units prior to breakfast, 4-5 units prior to evening meal (Patient taking differently: Inject 3-5 Units into the skin See admin instructions. 3-4 units prior to breakfast, 4-5 units prior to evening meal as needed for high blood sugar)   metFORMIN (GLUCOPHAGE) 500 MG tablet TAKE 2 TABLETS BY MOUTH TWICE DAILY WITH A MEAL   tamsulosin (FLOMAX) 0.4 MG CAPS capsule TAKE 1 CAPSULE BY MOUTH DAILY   No facility-administered encounter medications on file as of 08/07/2023.    Allergies (verified) Amlodipine, Amitriptyline, Nortriptyline hcl, Statins, and Simvastatin   History: Past Medical History:  Diagnosis Date   Acute diastolic heart failure (HCC) 08/18/2018   Atherosclerosis of both carotid arteries 08/18/2020   Carotid dopplers 08/2019 40-59% bilateral stenosis and right subclavian stenosis   Bilateral carotid artery stenosis 08/18/2020   Right Carotid: Velocities in the right ICA are consistent with a 60-79%                 stenosis. Non-hemodynamically significant plaque <50% noted in                 the CCA. The  ECA appears >50% stenosed. Proximal subclavian                 artery dilatation with elevated and turbulent flow just past the                 narrowing, measuring 1.6 cm.   Left Carotid: Velocities in the left ICA are    CHF (congestive heart failure) (HCC)    Chronic left shoulder pain 03/18/2021   Chronic low back pain without sciatica 10/16/2008   H/o chronic LBP with h/o lumbar disc herniation and intermittent radicular pain  Chronic pain, on stable doses of Norco 10/325 TID. MRI in 2003 showing SMALL CENTRAL DISC HERNIATION AT L4-5.  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      CKD (chronic kidney disease), stage III (HCC)    Coronary artery  disease    Diabetes mellitus    Dyspnea    GERD (gastroesophageal reflux disease)    Hx of CABG 08/23/2018   LIMA to LAD, RIMA to RCA, SVG to OM3, EVH via right thigh   Hyperlipidemia    Hypertension    Hypertension associated with diabetes (HCC) 12/22/2008   Qualifier: Diagnosis of  By: Lafonda Mosses MD, Susan     Hypertensive nephropathy 08/18/2020   Insulin dependent type 2 diabetes mellitus (HCC) 12/22/2020   Long term (current) use of antithrombotics/antiplatelets 09/21/2022   Planned 82-months DAPT for DES for PAD on 09/21/2022.  Planned 6 months therapy. End 03/23/23.   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   LIMA to LAD, RIMA to RCA, SVG to OM3, [facility] via right thigh   Sleeping difficulties 09/08/2019   Subclavian artery stenosis, right (HCC) 08/18/2020   Carotid dopplers 08/2019 40-59% bilateral stenosis and right subclavian stenosis   Tobacco abuse    Tobacco abuse disorder    Qualifier: Diagnosis of  By: Lafonda Mosses MD, Darl Pikes     Past Surgical History:  Procedure Laterality Date   ABDOMINAL AORTOGRAM W/LOWER EXTREMITY N/A 09/21/2022   Procedure: ABDOMINAL AORTOGRAM W/LOWER EXTREMITY;  Surgeon: Iran Ouch, MD;  Location: [facility] INVASIVE CV LAB;  Service: Cardiovascular;  Laterality: N/A;   CORONARY ARTERY BYPASS GRAFT N/A 08/23/2018   Procedure: CORONARY ARTERY BYPASS GRAFTING (CABG) x 3; -Left Internal Mammary Artery to Left Anterior Descending Artery, -Right Internal Mammary Artery to Right Coronary Artery, -Saphenous Vein Graft to Obtuse  Marginal;  ENDOSCOPIC HARVEST GREATER SAPHENOUS VEIN  -Right Thigh;  Surgeon: Purcell Nails, MD;  Location: [facility] OR;  Service: Open Heart Surgery;  Laterality: N/A;   CORONARY PRESSURE/FFR STUDY N/A 08/20/2018   Procedure: INTRAVASCULAR PRESSURE WIRE/FFR STUDY;  Surgeon: Tonny Bollman, MD;  Location: [facility] INVASIVE CV LAB;  Service: Cardiovascular;  Laterality: N/A;   LEFT HEART CATH  AND CORONARY ANGIOGRAPHY N/A 08/20/2018   Procedure: LEFT HEART CATH AND CORONARY ANGIOGRAPHY;  Surgeon: Tonny Bollman, MD;  Location: [facility] INVASIVE CV LAB;  Service: Cardiovascular;  Laterality: N/A;   PERIPHERAL VASCULAR ATHERECTOMY Left 09/21/2022   Procedure: PERIPHERAL VASCULAR ATHERECTOMY;  Surgeon: Iran Ouch, MD;  Location: [facility] INVASIVE CV LAB;  Service: Cardiovascular;  Laterality: Left;  SFA   PERIPHERAL VASCULAR BALLOON ANGIOPLASTY Left 09/21/2022   Procedure: PERIPHERAL VASCULAR BALLOON ANGIOPLASTY;  Surgeon: Iran Ouch, MD;  Location: [facility] INVASIVE CV LAB;  Service: Cardiovascular;  Laterality: Left;  SFA   TEE WITHOUT CARDIOVERSION N/A 08/23/2018   Procedure: TRANSESOPHAGEAL ECHOCARDIOGRAM (TEE);  Surgeon: Purcell Nails, MD;  [facility] OR;  Service: Open Heart Surgery;  Laterality: N/A;   Family History  Problem Relation Age of Onset   Heart disease Mother    Diabetes Mother    Heart failure Mother    Heart disease Father    Sudden death Brother    Drug abuse Son    Social History   Socioeconomic History   Marital status: Married    Spouse name: Lawson Fiscal    Number of children: 2   Years of education: 10   Highest education level: 10th grade  Occupational History   Occupation: Music therapist  Tobacco Use   Smoking status: Every Day    Current packs/day: 0.00    Average packs/day: 1 pack/day for 48.0 years (48.0 ttl pk-yrs)    Types: Cigarettes    Start date: 08/12/1970    Last attempt to quit: 08/12/2018    Years since quitting: 4.9   Smokeless tobacco: Never   Tobacco comments:     Quit post CABG in 2019 - previous 1ppd  Vaping Use   Vaping status: Never Used  Substance and Sexual Activity   Alcohol use: No    Alcohol/week: 0.0 standard drinks of alcohol   Drug use: No   Sexual activity: Yes    Birth control/protection: Post-menopausal  Other Topics Concern   Not on file  Social History Narrative   Lives with his wife in [place].    Wife Lawson Fiscal is also [facility] patient.   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    Previously used marijuana -quit 10/03. Previous 1ppd smoker- quit 2019.   Surrogate decision maker: Harpreet Buisson (wife)   Patient has one daughter.   Patients son died of a drug overdose 2021/02/16.   Patient has 2 dogs and 2 cats.       Social Determinants of Health   Financial Resource Strain: Low Risk  (04/21/2021)   Overall Financial Resource Strain (CARDIA)    Difficulty of Paying Living Expenses: Not hard at all  Food Insecurity: No Food Insecurity (09/21/2022)   Hunger Vital Sign    Worried About Running Out of Food in the Last Year: Never true    Ran Out of Food in the Last Year: Never true  Transportation Needs: No Transportation Needs (09/21/2022)   PRAPARE - Administrator, Civil Service (Medical): No    Lack of Transportation (Non-Medical): No  Physical Activity: Sufficiently Active (04/21/2021)   Exercise Vital Sign    Days of Exercise per Week: 5 days    Minutes of Exercise per Session: 30 min  Stress: No Stress Concern Present (04/21/2021)   [employer] of Occupational Health - Occupational Stress Questionnaire    Feeling of Stress : Not at all  Social Connections: Unknown (08/25/2022)   [facility]  Social Network    Social Network: Not on file    Tobacco Counseling Ready to quit: Not Answered Counseling given: Not Answered Tobacco comments: Quit post CABG in 2019 - previous 1ppd   Clinical Intake:                        Activities of Daily Living    09/21/2022   11:00 PM  In your  present state of health, do you have any difficulty performing the following activities:  Hearing? 0  Vision? 1  Difficulty concentrating or making decisions? 0  Walking or climbing stairs? 0  Dressing or bathing? 0  Doing errands, shopping? 0    Patient Care Team: McDiarmid, Leighton Roach, MD as PCP - General (Family Medicine) Jens Som Madolyn Frieze, MD as PCP - Cardiology (Cardiology) Iran Ouch, MD as PCP - [facility] Cardiology (Cardiology) Antony Contras, MD as Consulting Physician (Ophthalmology)  Indicate any recent Medical Services you may have received from other than [facility] providers in the past year (date may be approximate).     Assessment:   This is a routine wellness examination for Arjay.  Hearing/Vision screen No results found.  Dietary issues and exercise activities discussed:     Goals Addressed   None    Depression Screen    05/04/2023    8:29 AM 04/13/2023    9:39 AM 06/16/2022   10:07 AM 05/12/2022   10:03 AM 10/14/2021    9:45 AM 04/21/2021   10:05 AM 03/18/2021    9:03 AM  PHQ 2/9 Scores  PHQ - 2 Score 1 0 1 1 0 0 0  PHQ- 9 Score 8 7 12 9 8 4 6     Fall Risk    05/04/2023    8:29 AM 04/13/2023    8:55 AM 06/16/2022   10:08 AM 05/12/2022   10:09 AM 10/14/2021    9:45 AM  Fall Risk   Falls in the past year? 0 0 1 0 1  Number falls in past yr: 0 0 0 0 0  Injury with Fall? 0 0  0 0    MEDICARE RISK AT HOME:    TIMED UP AND GO:  Was the test performed?  No    Cognitive Function:        04/21/2021   10:12 AM  6CIT Screen  What Year? 0 points  What month? 0 points  What time? 0 points  Count back from 20 0 points  Months in reverse 0 points  Repeat phrase 0 points  Total Score 0 points    Immunizations Immunization History  Administered Date(s) Administered   Fluad Quad(high Dose 65+) 12/10/2020, 10/14/2021   Influenza Whole 10/16/2008   Influenza,inj,Quad PF,6+ Mos 10/02/2013, 08/28/2018   PFIZER(Purple Top)SARS-COV-2 Vaccination 12/31/2019,  01/31/2020, 12/10/2020   PNEUMOCOCCAL CONJUGATE-20 05/12/2022   Pneumococcal Polysaccharide-23 05/07/2020   Tdap 10/02/2014    TDAP status: Up to date  Flu Vaccine status: Due, Education has been provided regarding the importance of this vaccine. Advised may receive this vaccine at local pharmacy or Health Dept. Aware to provide a copy of the vaccination record if obtained from local pharmacy or Health Dept. Verbalized acceptance and understanding.  Pneumococcal vaccine status: Up to date  Covid-19 vaccine status: Information provided on how to obtain vaccines.   Qualifies for Shingles Vaccine? Yes   Zostavax completed No   Shingrix Completed?: No.    Education has been provided regarding the importance of this  vaccine. Patient has been advised to call insurance company to determine out of pocket expense if they have not yet received this vaccine. Advised may also receive vaccine at local pharmacy or Health Dept. Verbalized acceptance and understanding.  Screening Tests Health Maintenance  Topic Date Due   Zoster Vaccines- Shingrix (1 of 2) Never done   Colonoscopy  Never done   Lung Cancer Screening  Never done   OPHTHALMOLOGY EXAM  06/21/2018   COLON CANCER SCREENING ANNUAL FOBT  01/22/2019   Medicare Annual Wellness (AWV)  04/21/2022   COVID-19 Vaccine (4 - 2023-24 season) 08/12/2022   Diabetic kidney evaluation - Urine ACR  06/17/2023   FOOT EXAM  06/17/2023   INFLUENZA VACCINE  07/13/2023   HEMOGLOBIN A1C  10/14/2023   Diabetic kidney evaluation - eGFR measurement  05/03/2024   DTaP/Tdap/Td (2 - Td or Tdap) 10/02/2024   Pneumonia Vaccine 58+ Years old  Completed   Hepatitis C Screening  Completed   HPV VACCINES  Aged Out    Health Maintenance  Health Maintenance Due  Topic Date Due   Zoster Vaccines- Shingrix (1 of 2) Never done   Colonoscopy  Never done   Lung Cancer Screening  Never done   OPHTHALMOLOGY EXAM  06/21/2018   COLON CANCER SCREENING ANNUAL FOBT   01/22/2019   Medicare Annual Wellness (AWV)  04/21/2022   COVID-19 Vaccine (4 - 2023-24 season) 08/12/2022   Diabetic kidney evaluation - Urine ACR  06/17/2023   FOOT EXAM  06/17/2023   INFLUENZA VACCINE  07/13/2023    {Colorectal cancer screening:2101809}  Lung Cancer Screening: (Low Dose CT Chest recommended if Age 63-80 years, 20 pack-year currently smoking OR have quit w/in 15years.) does not qualify.   Lung Cancer Screening Referral: n/a  Additional Screening:  Hepatitis C Screening: does qualify; Completed 06/18/12  Vision Screening: Recommended annual ophthalmology exams for early detection of glaucoma and other disorders of the eye. Is the patient up to date with their annual eye exam?  {YES/NO:21197} Who is the provider or what is the name of the office in which the patient attends annual eye exams? *** If pt is not established with a provider, would they like to be referred to a provider to establish care? {YES/NO:21197}.   Dental Screening: Recommended annual dental exams for proper oral hygiene  Diabetic Foot Exam: Diabetic Foot Exam: Overdue, Pt has been advised about the importance in completing this exam. Pt is scheduled for diabetic foot exam on at next office visit.  Community Resource Referral / Chronic Care Management: CRR required this visit?  {YES/NO:21197}  CCM required this visit?  {CCM Required [phone]}     Plan:     I have personally reviewed and noted the following in the patient's chart:   Medical and social history Use of alcohol, tobacco or illicit drugs  Current medications and supplements including opioid prescriptions. {Opioid [phone]} Functional ability and status Nutritional status Physical activity Advanced directives List of other physicians Hospitalizations, surgeries, and ER visits in previous 12 months Vitals Screenings to include cognitive, depression, and falls Referrals and appointments  In  addition, I have reviewed and discussed with patient certain preventive protocols, quality metrics, and best practice recommendations. A written personalized care plan for preventive services as well as general preventive health recommendations were provided to patient.     Kandis Fantasia [place], [place]   7/82/9562   After Visit Summary: {CHL AMB AWV After Visit Summary:[id number]}  Nurse Notes: ***

## 2023-08-06 NOTE — Patient Instructions (Incomplete)
[name] , Thank you for taking time to come for your Medicare Wellness Visit. I appreciate your ongoing commitment to your health goals. Please review the following plan we discussed and let me know if I can assist you in the future.   Referrals/Orders/Follow-Ups/Clinician Recommendations: Aim for 30 minutes of exercise or brisk walking, 6-8 glasses of water, and 5 servings of fruits and vegetables each day.  This is a list of the screening recommended for you and due dates:  Health Maintenance  Topic Date Due   Zoster (Shingles) Vaccine (1 of 2) Never done   Colon Cancer Screening  Never done   Screening for Lung Cancer  Never done   Eye exam for diabetics  06/21/2018   Stool Blood Test  01/22/2019   Medicare Annual Wellness Visit  04/21/2022   COVID-19 Vaccine (4 - 2023-24 season) 08/12/2022   Yearly kidney health urinalysis for diabetes  06/17/2023   Complete foot exam   06/17/2023   Flu Shot  07/13/2023   Hemoglobin A1C  10/14/2023   Yearly kidney function blood test for diabetes  05/03/2024   DTaP/Tdap/Td vaccine (2 - Td or Tdap) 10/02/2024   Pneumonia Vaccine  Completed   Hepatitis C Screening  Completed   HPV Vaccine  Aged Out    Advanced directives: (ACP Link)Information on Advanced Care Planning can be found at [facility] [place] Advance Health Care Directives Advance Health Care Directives ([email or web address]   Next Medicare Annual Wellness Visit scheduled for next year: Yes

## 2023-08-07 ENCOUNTER — Ambulatory Visit (INDEPENDENT_AMBULATORY_CARE_PROVIDER_SITE_OTHER): Payer: Medicare HMO

## 2023-08-07 VITALS — Ht 71.0 in | Wt 139.0 lb

## 2023-08-07 DIAGNOSIS — Z1212 Encounter for screening for malignant neoplasm of rectum: Secondary | ICD-10-CM

## 2023-08-07 DIAGNOSIS — Z0000 Encounter for general adult medical examination without abnormal findings: Secondary | ICD-10-CM | POA: Diagnosis not present

## 2023-08-07 DIAGNOSIS — Z0100 Encounter for examination of eyes and vision without abnormal findings: Secondary | ICD-10-CM

## 2023-08-07 DIAGNOSIS — E1151 Type 2 diabetes mellitus with diabetic peripheral angiopathy without gangrene: Secondary | ICD-10-CM

## 2023-08-10 ENCOUNTER — Encounter: Payer: Self-pay | Admitting: Family Medicine

## 2023-08-10 ENCOUNTER — Ambulatory Visit (INDEPENDENT_AMBULATORY_CARE_PROVIDER_SITE_OTHER): Payer: Medicare HMO | Admitting: Family Medicine

## 2023-08-10 VITALS — BP 119/48 | HR 56 | Ht 71.0 in | Wt 137.5 lb

## 2023-08-10 DIAGNOSIS — I25119 Atherosclerotic heart disease of native coronary artery with unspecified angina pectoris: Secondary | ICD-10-CM

## 2023-08-10 DIAGNOSIS — E1151 Type 2 diabetes mellitus with diabetic peripheral angiopathy without gangrene: Secondary | ICD-10-CM | POA: Diagnosis not present

## 2023-08-10 DIAGNOSIS — E1159 Type 2 diabetes mellitus with other circulatory complications: Secondary | ICD-10-CM | POA: Diagnosis not present

## 2023-08-10 DIAGNOSIS — I5022 Chronic systolic (congestive) heart failure: Secondary | ICD-10-CM

## 2023-08-10 DIAGNOSIS — I152 Hypertension secondary to endocrine disorders: Secondary | ICD-10-CM | POA: Diagnosis not present

## 2023-08-10 HISTORY — DX: Atherosclerotic heart disease of native coronary artery with unspecified angina pectoris: I25.119

## 2023-08-10 LAB — POCT GLYCOSYLATED HEMOGLOBIN (HGB A1C): HbA1c, POC (controlled diabetic range): 9.1 % — AB (ref 0.0–7.0)

## 2023-08-10 NOTE — Patient Instructions (Addendum)
Your A1c is quite a bit better.  It is 9.1%, down from 10.8% back in May.   Dr Raymondo Band and I will look at the blood sugar numbers to see if we can find a pattern.  Your Blood Pressure is under good control.  Keep taking your blood pressure medicines like you are.   We will work on getting you appointments with the Eye doctors and the Chest CAT scan to look for lung cancer.   Keep taking your insulin like you are for now.  It must be working better than it was.

## 2023-08-10 NOTE — Progress Notes (Signed)
Colin Keller is alone Sources of clinical information for visit is/are patient and past medical records. Nursing assessment for this office visit was reviewed with the patient for accuracy and revision.     Previous Report(s) Reviewed: none     08/10/2023   10:58 AM  Depression screen PHQ 2/9  Decreased Interest 0  Down, Depressed, Hopeless 0  PHQ - 2 Score 0  Altered sleeping 3  Tired, decreased energy 2  Change in appetite 1  Feeling bad or failure about yourself  0  Trouble concentrating 0  Moving slowly or fidgety/restless 0  Suicidal thoughts 0  PHQ-9 Score 6   Flowsheet Row Office Visit from 08/10/2023 in [facility] Office Visit from 05/04/2023 in [facility] Office Visit from 04/13/2023 in [facility]  Thoughts that you would be better off dead, or of hurting yourself in some way Not at all Not at all Not at all  PHQ-9 Total Score 6 8 7           08/10/2023   10:58 AM 08/07/2023    9:29 AM 05/04/2023    8:29 AM 04/13/2023    8:55 AM 06/16/2022   10:08 AM  Fall Risk   Falls in the past year? 0 0 0 0 1  Number falls in past yr: 0 0 0 0 0  Injury with Fall? 0 0 0 0   Risk for fall due to :  No Fall Risks     Follow up  Falls prevention discussed;Education provided;Falls evaluation completed          08/10/2023   10:58 AM 08/07/2023    9:26 AM 05/04/2023    8:29 AM  PHQ9 SCORE ONLY  PHQ-9 Total Score 6 0 8    There are no preventive care reminders to display for this patient.  Health Maintenance Due  Topic Date Due   Zoster Vaccines- Shingrix (1 of 2) Never done   Colonoscopy  Never done   Lung Cancer Screening  Never done   OPHTHALMOLOGY EXAM  06/21/2018   COLON CANCER SCREENING ANNUAL FOBT  01/22/2019   Diabetic kidney evaluation - Urine ACR  06/17/2023   FOOT EXAM  06/17/2023   INFLUENZA VACCINE  07/13/2023      History/P.E. limitations: none  There are no preventive care reminders to  display for this patient.  Diabetes Health Maintenance Due  Topic Date Due   OPHTHALMOLOGY EXAM  06/21/2018   FOOT EXAM  06/17/2023   HEMOGLOBIN A1C  02/09/2024    Health Maintenance Due  Topic Date Due   Zoster Vaccines- Shingrix (1 of 2) Never done   Colonoscopy  Never done   Lung Cancer Screening  Never done   OPHTHALMOLOGY EXAM  06/21/2018   COLON CANCER SCREENING ANNUAL FOBT  01/22/2019   Diabetic kidney evaluation - Urine ACR  06/17/2023   FOOT EXAM  06/17/2023   INFLUENZA VACCINE  07/13/2023     No chief complaint on file.    --------------------------------------------------------------------------------------------------------------------------------------------- Visit Problem List with A/P  No problem-specific Assessment & Plan notes found for this encounter.   CPT E&M Office Visit Time Before Visit; reviewing medical records (e.g. recent visits, labs, studies): 5 minutes During Visit (F2F time): 20 minutes After Visit (discussion with family or HCP, prescribing, ordering, referring, calling result/recommendations or documenting on same day): 5 minutes Total Visit Time: 30 minutes

## 2023-08-15 ENCOUNTER — Other Ambulatory Visit: Payer: Self-pay | Admitting: Family Medicine

## 2023-08-15 DIAGNOSIS — N401 Enlarged prostate with lower urinary tract symptoms: Secondary | ICD-10-CM

## 2023-08-15 DIAGNOSIS — E1169 Type 2 diabetes mellitus with other specified complication: Secondary | ICD-10-CM | POA: Diagnosis not present

## 2023-08-30 DIAGNOSIS — E1122 Type 2 diabetes mellitus with diabetic chronic kidney disease: Secondary | ICD-10-CM | POA: Diagnosis not present

## 2023-08-30 DIAGNOSIS — R809 Proteinuria, unspecified: Secondary | ICD-10-CM | POA: Diagnosis not present

## 2023-08-30 DIAGNOSIS — N1832 Chronic kidney disease, stage 3b: Secondary | ICD-10-CM | POA: Diagnosis not present

## 2023-08-30 DIAGNOSIS — D631 Anemia in chronic kidney disease: Secondary | ICD-10-CM | POA: Diagnosis not present

## 2023-08-30 DIAGNOSIS — I129 Hypertensive chronic kidney disease with stage 1 through stage 4 chronic kidney disease, or unspecified chronic kidney disease: Secondary | ICD-10-CM | POA: Diagnosis not present

## 2023-08-30 DIAGNOSIS — N2581 Secondary hyperparathyroidism of renal origin: Secondary | ICD-10-CM | POA: Diagnosis not present

## 2023-08-31 ENCOUNTER — Other Ambulatory Visit: Payer: Self-pay

## 2023-08-31 ENCOUNTER — Other Ambulatory Visit: Payer: Self-pay | Admitting: Family Medicine

## 2023-09-25 ENCOUNTER — Telehealth: Payer: Self-pay

## 2023-09-25 NOTE — Telephone Encounter (Signed)
Wife returns call to nurse line.   She is requesting prescription to be sent into [facility] in [place].   Veronda Prude, RN

## 2023-09-25 NOTE — Telephone Encounter (Signed)
Patient's wife calls nurse line regarding patient testing positive for COVID.   Tested positive yesterday with symptom onset on Friday.   She reports body aches, runny nose, chills, congestion, cough (primarily in the AM), change in taste.   Denies known fever, they do not have thermometer to check. He is eating and drinking normally.   Requesting that message be sent to provider regarding antiviral therapy.   Advised patient of supportive measures and that I would send message to PCP.   Red flags discussed.   Veronda Prude, RN

## 2023-09-26 ENCOUNTER — Other Ambulatory Visit: Payer: Self-pay | Admitting: Family Medicine

## 2023-09-26 MED ORDER — PAXLOVID (300/100) 20 X 150 MG & 10 X 100MG PO TBPK: 2.0000 | ORAL_TABLET | Freq: Two times a day (BID) | ORAL | 0 refills | Status: AC

## 2023-09-26 MED ORDER — PAXLOVID (300/100) 20 X 150 MG & 10 X 100MG PO TBPK: 2.0000 | ORAL_TABLET | Freq: Two times a day (BID) | ORAL | 0 refills | Status: DC

## 2023-09-26 NOTE — Telephone Encounter (Signed)
Original sent to print. Resent electronically.   Attempted to call wife with update. She did not answer, LVM asking that she return my call.   Veronda Prude, RN

## 2023-09-29 ENCOUNTER — Telehealth: Payer: Self-pay

## 2023-09-29 MED ORDER — HYDROCODONE-ACETAMINOPHEN 10-325 MG PO TABS: 1.0000 | ORAL_TABLET | Freq: Two times a day (BID) | ORAL | 0 refills | Status: DC | PRN

## 2023-09-29 NOTE — Telephone Encounter (Signed)
Lori calls nurse line requesting a refill on patients pain medication.   She reports if PCP can send this in today to send it to [facility]. She reports [facility] closes today at noon.  However, medication appears to be due tomorrow per last script.  If not filling until tomorrow please send to [facility] as usual.   Will forward to PCP.

## 2023-10-02 NOTE — Telephone Encounter (Signed)
Patient returns call to nurse line.   He is still experiencing fatigue and decreased appetite. He reports having to force himself to eat.  He is drinking water, gatorade, gingerale.   He reports that blood sugar levels have been running in the 200-300's.   He is taking 10-12 units of Basaglar daily. He is not using sliding scale.   Advised of supportive measures and that it can take a while for energy levels to return post COVID.   Will forward to Dr. McDiarmid for further recommendations.   Veronda Prude, RN

## 2023-10-03 ENCOUNTER — Emergency Department (HOSPITAL_COMMUNITY): Payer: Medicare HMO

## 2023-10-03 ENCOUNTER — Other Ambulatory Visit: Payer: Self-pay

## 2023-10-03 ENCOUNTER — Telehealth: Payer: Self-pay

## 2023-10-03 ENCOUNTER — Encounter (HOSPITAL_COMMUNITY): Payer: Self-pay | Admitting: Family Medicine

## 2023-10-03 ENCOUNTER — Inpatient Hospital Stay (HOSPITAL_COMMUNITY)
Admission: EM | Admit: 2023-10-03 | Discharge: 2023-10-05 | DRG: 637 | Disposition: A | Discharge status: Home / Self Care | Payer: Medicare HMO | Attending: Family Medicine | Admitting: Family Medicine

## 2023-10-03 ENCOUNTER — Ambulatory Visit: Payer: Medicare HMO

## 2023-10-03 DIAGNOSIS — I6523 Occlusion and stenosis of bilateral carotid arteries: Secondary | ICD-10-CM | POA: Diagnosis not present

## 2023-10-03 DIAGNOSIS — E875 Hyperkalemia: Secondary | ICD-10-CM | POA: Diagnosis not present

## 2023-10-03 DIAGNOSIS — Z7984 Long term (current) use of oral hypoglycemic drugs: Secondary | ICD-10-CM | POA: Diagnosis not present

## 2023-10-03 DIAGNOSIS — Z79899 Other long term (current) drug therapy: Secondary | ICD-10-CM

## 2023-10-03 DIAGNOSIS — I130 Hypertensive heart and chronic kidney disease with heart failure and stage 1 through stage 4 chronic kidney disease, or unspecified chronic kidney disease: Secondary | ICD-10-CM | POA: Diagnosis not present

## 2023-10-03 DIAGNOSIS — E860 Dehydration: Secondary | ICD-10-CM | POA: Diagnosis present

## 2023-10-03 DIAGNOSIS — Z951 Presence of aortocoronary bypass graft: Secondary | ICD-10-CM

## 2023-10-03 DIAGNOSIS — N179 Acute kidney failure, unspecified: Secondary | ICD-10-CM | POA: Diagnosis not present

## 2023-10-03 DIAGNOSIS — E861 Hypovolemia: Secondary | ICD-10-CM | POA: Diagnosis present

## 2023-10-03 DIAGNOSIS — G8929 Other chronic pain: Secondary | ICD-10-CM | POA: Diagnosis present

## 2023-10-03 DIAGNOSIS — M545 Low back pain, unspecified: Secondary | ICD-10-CM | POA: Diagnosis present

## 2023-10-03 DIAGNOSIS — E1165 Type 2 diabetes mellitus with hyperglycemia: Secondary | ICD-10-CM

## 2023-10-03 DIAGNOSIS — E11319 Type 2 diabetes mellitus with unspecified diabetic retinopathy without macular edema: Secondary | ICD-10-CM | POA: Diagnosis present

## 2023-10-03 DIAGNOSIS — I5022 Chronic systolic (congestive) heart failure: Secondary | ICD-10-CM | POA: Diagnosis present

## 2023-10-03 DIAGNOSIS — N400 Enlarged prostate without lower urinary tract symptoms: Secondary | ICD-10-CM | POA: Diagnosis present

## 2023-10-03 DIAGNOSIS — I2489 Other forms of acute ischemic heart disease: Secondary | ICD-10-CM | POA: Diagnosis not present

## 2023-10-03 DIAGNOSIS — Z7982 Long term (current) use of aspirin: Secondary | ICD-10-CM

## 2023-10-03 DIAGNOSIS — W19XXXA Unspecified fall, initial encounter: Secondary | ICD-10-CM

## 2023-10-03 DIAGNOSIS — Z794 Long term (current) use of insulin: Secondary | ICD-10-CM | POA: Diagnosis not present

## 2023-10-03 DIAGNOSIS — E785 Hyperlipidemia, unspecified: Secondary | ICD-10-CM | POA: Diagnosis present

## 2023-10-03 DIAGNOSIS — E1110 Type 2 diabetes mellitus with ketoacidosis without coma: Secondary | ICD-10-CM | POA: Diagnosis not present

## 2023-10-03 DIAGNOSIS — N1831 Chronic kidney disease, stage 3a: Secondary | ICD-10-CM | POA: Diagnosis not present

## 2023-10-03 DIAGNOSIS — Y92009 Unspecified place in unspecified non-institutional (private) residence as the place of occurrence of the external cause: Secondary | ICD-10-CM | POA: Diagnosis not present

## 2023-10-03 DIAGNOSIS — U071 COVID-19: Secondary | ICD-10-CM | POA: Diagnosis not present

## 2023-10-03 DIAGNOSIS — I2510 Atherosclerotic heart disease of native coronary artery without angina pectoris: Secondary | ICD-10-CM | POA: Diagnosis present

## 2023-10-03 DIAGNOSIS — I509 Heart failure, unspecified: Secondary | ICD-10-CM | POA: Diagnosis not present

## 2023-10-03 DIAGNOSIS — K219 Gastro-esophageal reflux disease without esophagitis: Secondary | ICD-10-CM | POA: Diagnosis present

## 2023-10-03 DIAGNOSIS — F17210 Nicotine dependence, cigarettes, uncomplicated: Secondary | ICD-10-CM | POA: Diagnosis present

## 2023-10-03 DIAGNOSIS — Z8249 Family history of ischemic heart disease and other diseases of the circulatory system: Secondary | ICD-10-CM

## 2023-10-03 DIAGNOSIS — R0602 Shortness of breath: Secondary | ICD-10-CM | POA: Diagnosis not present

## 2023-10-03 DIAGNOSIS — J449 Chronic obstructive pulmonary disease, unspecified: Secondary | ICD-10-CM | POA: Diagnosis present

## 2023-10-03 DIAGNOSIS — R7989 Other specified abnormal findings of blood chemistry: Secondary | ICD-10-CM | POA: Diagnosis not present

## 2023-10-03 DIAGNOSIS — E0810 Diabetes mellitus due to underlying condition with ketoacidosis without coma: Secondary | ICD-10-CM | POA: Diagnosis not present

## 2023-10-03 DIAGNOSIS — I700 Atherosclerosis of aorta: Secondary | ICD-10-CM | POA: Diagnosis not present

## 2023-10-03 DIAGNOSIS — E1122 Type 2 diabetes mellitus with diabetic chronic kidney disease: Secondary | ICD-10-CM | POA: Diagnosis not present

## 2023-10-03 DIAGNOSIS — I110 Hypertensive heart disease with heart failure: Secondary | ICD-10-CM | POA: Diagnosis not present

## 2023-10-03 DIAGNOSIS — S0990XA Unspecified injury of head, initial encounter: Secondary | ICD-10-CM | POA: Diagnosis not present

## 2023-10-03 HISTORY — DX: Type 2 diabetes mellitus with ketoacidosis without coma: E11.10

## 2023-10-03 LAB — GLUCOSE, CAPILLARY
Glucose-Capillary: 201 mg/dL — ABNORMAL HIGH (ref 70–99)
Glucose-Capillary: 201 mg/dL — ABNORMAL HIGH (ref 70–99)

## 2023-10-03 LAB — CBC WITH DIFFERENTIAL/PLATELET
Abs Immature Granulocytes: 0.07 10*3/uL (ref 0.00–0.07)
Basophils Absolute: 0.1 10*3/uL (ref 0.0–0.1)
Basophils Relative: 0 %
Eosinophils Absolute: 0 10*3/uL (ref 0.0–0.5)
Eosinophils Relative: 0 %
HCT: 39.9 % (ref 39.0–52.0)
Hemoglobin: 12.7 g/dL — ABNORMAL LOW (ref 13.0–17.0)
Immature Granulocytes: 1 %
Lymphocytes Relative: 12 %
Lymphs Abs: 1.4 10*3/uL (ref 0.7–4.0)
MCH: 32.3 pg (ref 26.0–34.0)
MCHC: 31.8 g/dL (ref 30.0–36.0)
MCV: 101.5 fL — ABNORMAL HIGH (ref 80.0–100.0)
Monocytes Absolute: 0.2 10*3/uL (ref 0.1–1.0)
Monocytes Relative: 2 %
Neutro Abs: 9.7 10*3/uL — ABNORMAL HIGH (ref 1.7–7.7)
Neutrophils Relative %: 85 %
Platelets: 282 10*3/uL (ref 150–400)
RBC: 3.93 MIL/uL — ABNORMAL LOW (ref 4.22–5.81)
RDW: 13.3 % (ref 11.5–15.5)
WBC: 11.4 10*3/uL — ABNORMAL HIGH (ref 4.0–10.5)
nRBC: 0 % (ref 0.0–0.2)

## 2023-10-03 LAB — URINALYSIS, ROUTINE W REFLEX MICROSCOPIC
Bacteria, UA: NONE SEEN
Bilirubin Urine: NEGATIVE
Glucose, UA: >=500 mg/dL — AB
Ketones, ur: 80 mg/dL — AB
Leukocytes,Ua: NEGATIVE
Nitrite: NEGATIVE
Protein, ur: 100 mg/dL — AB
Specific Gravity, Urine: 1.017 (ref 1.005–1.030)
pH: 5 (ref 5.0–8.0)

## 2023-10-03 LAB — BASIC METABOLIC PANEL
Anion gap: 28 — ABNORMAL HIGH (ref 5–15)
Anion gap: 24 — ABNORMAL HIGH (ref 5–15)
Anion gap: 20 — ABNORMAL HIGH (ref 5–15)
BUN: 54 mg/dL — ABNORMAL HIGH (ref 8–23)
BUN: 56 mg/dL — ABNORMAL HIGH (ref 8–23)
BUN: 53 mg/dL — ABNORMAL HIGH (ref 8–23)
CO2: 9 mmol/L — ABNORMAL LOW (ref 22–32)
CO2: 7 mmol/L — ABNORMAL LOW (ref 22–32)
CO2: 12 mmol/L — ABNORMAL LOW (ref 22–32)
Calcium: 9.5 mg/dL (ref 8.9–10.3)
Calcium: 9.1 mg/dL (ref 8.9–10.3)
Calcium: 9.3 mg/dL (ref 8.9–10.3)
Chloride: 101 mmol/L (ref 98–111)
Chloride: 105 mmol/L (ref 98–111)
Chloride: 108 mmol/L (ref 98–111)
Creatinine, Ser: 2.55 mg/dL — ABNORMAL HIGH (ref 0.61–1.24)
Creatinine, Ser: 2.51 mg/dL — ABNORMAL HIGH (ref 0.61–1.24)
Creatinine, Ser: 2.34 mg/dL — ABNORMAL HIGH (ref 0.61–1.24)
GFR, Estimated: 26 mL/min — ABNORMAL LOW (ref >60)
GFR, Estimated: 27 mL/min — ABNORMAL LOW (ref >60)
GFR, Estimated: 29 mL/min — ABNORMAL LOW (ref >60)
Glucose, Bld: 483 mg/dL — ABNORMAL HIGH (ref 70–99)
Glucose, Bld: 430 mg/dL — ABNORMAL HIGH (ref 70–99)
Glucose, Bld: 275 mg/dL — ABNORMAL HIGH (ref 70–99)
Potassium: 5.7 mmol/L — ABNORMAL HIGH (ref 3.5–5.1)
Potassium: 5.5 mmol/L — ABNORMAL HIGH (ref 3.5–5.1)
Potassium: 4.2 mmol/L (ref 3.5–5.1)
Sodium: 138 mmol/L (ref 135–145)
Sodium: 136 mmol/L (ref 135–145)
Sodium: 140 mmol/L (ref 135–145)

## 2023-10-03 LAB — BRAIN NATRIURETIC PEPTIDE: B Natriuretic Peptide: 450.8 pg/mL — ABNORMAL HIGH (ref 0.0–100.0)

## 2023-10-03 LAB — CBG MONITORING, ED
Glucose-Capillary: 434 mg/dL — ABNORMAL HIGH (ref 70–99)
Glucose-Capillary: 482 mg/dL — ABNORMAL HIGH (ref 70–99)
Glucose-Capillary: 375 mg/dL — ABNORMAL HIGH (ref 70–99)
Glucose-Capillary: 332 mg/dL — ABNORMAL HIGH (ref 70–99)
Glucose-Capillary: 286 mg/dL — ABNORMAL HIGH (ref 70–99)
Glucose-Capillary: 250 mg/dL — ABNORMAL HIGH (ref 70–99)

## 2023-10-03 LAB — I-STAT VENOUS BLOOD GAS, ED
Acid-base deficit: 19 mmol/L — ABNORMAL HIGH (ref 0.0–2.0)
Bicarbonate: 6.5 mmol/L — ABNORMAL LOW (ref 20.0–28.0)
Calcium, Ion: 1.2 mmol/L (ref 1.15–1.40)
HCT: 40 % (ref 39.0–52.0)
Hemoglobin: 13.6 g/dL (ref 13.0–17.0)
O2 Saturation: 98 %
Potassium: 6.1 mmol/L — ABNORMAL HIGH (ref 3.5–5.1)
Sodium: 134 mmol/L — ABNORMAL LOW (ref 135–145)
TCO2: 7 mmol/L — ABNORMAL LOW (ref 22–32)
pCO2, Ven: 16.6 mm[Hg] — CL (ref 44–60)
pH, Ven: 7.197 — CL (ref 7.25–7.43)
pO2, Ven: 130 mm[Hg] — ABNORMAL HIGH (ref 32–45)

## 2023-10-03 LAB — COMPREHENSIVE METABOLIC PANEL
ALT: 16 U/L (ref 0–44)
AST: 18 U/L (ref 15–41)
Albumin: 3.6 g/dL (ref 3.5–5.0)
Alkaline Phosphatase: 66 U/L (ref 38–126)
Anion gap: 27 — ABNORMAL HIGH (ref 5–15)
BUN: 59 mg/dL — ABNORMAL HIGH (ref 8–23)
CO2: 7 mmol/L — ABNORMAL LOW (ref 22–32)
Calcium: 9.6 mg/dL (ref 8.9–10.3)
Chloride: 103 mmol/L (ref 98–111)
Creatinine, Ser: 2.58 mg/dL — ABNORMAL HIGH (ref 0.61–1.24)
GFR, Estimated: 26 mL/min — ABNORMAL LOW (ref >60)
Glucose, Bld: 507 mg/dL (ref 70–99)
Potassium: 6.4 mmol/L (ref 3.5–5.1)
Sodium: 137 mmol/L (ref 135–145)
Total Bilirubin: 1.7 mg/dL — ABNORMAL HIGH (ref 0.3–1.2)
Total Protein: 7.2 g/dL (ref 6.5–8.1)

## 2023-10-03 LAB — RESP PANEL BY RT-PCR (RSV, FLU A&B, COVID)  RVPGX2
Influenza A by PCR: NEGATIVE
Influenza B by PCR: NEGATIVE
Resp Syncytial Virus by PCR: NEGATIVE
SARS Coronavirus 2 by RT PCR: POSITIVE — AB

## 2023-10-03 LAB — LIPASE, BLOOD: Lipase: 22 U/L (ref 11–51)

## 2023-10-03 LAB — HIV ANTIBODY (ROUTINE TESTING W REFLEX): HIV Screen 4th Generation wRfx: NONREACTIVE

## 2023-10-03 LAB — TROPONIN I (HIGH SENSITIVITY)
Troponin I (High Sensitivity): 26 ng/L — ABNORMAL HIGH (ref <18)
Troponin I (High Sensitivity): 34 ng/L — ABNORMAL HIGH (ref <18)
Troponin I (High Sensitivity): 76 ng/L — ABNORMAL HIGH (ref <18)

## 2023-10-03 MED ORDER — ENOXAPARIN SODIUM 30 MG/0.3ML IJ SOSY
30.0000 mg | PREFILLED_SYRINGE | INTRAMUSCULAR | Status: DC
  Administered 2023-10-03 – 2023-10-04 (×2): 30 mg via SUBCUTANEOUS
  Filled 2023-10-03 (×2): qty 0.3

## 2023-10-03 MED ORDER — LACTATED RINGERS IV BOLUS
20.0000 mL/kg | Freq: Once | INTRAVENOUS | Status: AC
  Administered 2023-10-03: 1240 mL via INTRAVENOUS

## 2023-10-03 MED ORDER — LACTATED RINGERS IV SOLN: INTRAVENOUS | Status: DC

## 2023-10-03 MED ORDER — HYDRALAZINE HCL 50 MG PO TABS
50.0000 mg | ORAL_TABLET | Freq: Three times a day (TID) | ORAL | Status: DC
  Administered 2023-10-03 – 2023-10-05 (×5): 50 mg via ORAL
  Filled 2023-10-03 (×5): qty 1

## 2023-10-03 MED ORDER — TAMSULOSIN HCL 0.4 MG PO CAPS
0.4000 mg | ORAL_CAPSULE | Freq: Every day | ORAL | Status: DC
  Administered 2023-10-03 – 2023-10-05 (×3): 0.4 mg via ORAL
  Filled 2023-10-03 (×3): qty 1

## 2023-10-03 MED ORDER — INSULIN REGULAR(HUMAN) IN NACL 100-0.9 UT/100ML-% IV SOLN
INTRAVENOUS | Status: AC
  Administered 2023-10-03: 8 [IU]/h via INTRAVENOUS
  Administered 2023-10-04: 2.2 [IU]/h via INTRAVENOUS
  Filled 2023-10-03: qty 100

## 2023-10-03 MED ORDER — DEXTROSE IN LACTATED RINGERS 5 % IV SOLN: INTRAVENOUS | Status: DC

## 2023-10-03 MED ORDER — ACETAMINOPHEN 500 MG PO TABS
1000.0000 mg | ORAL_TABLET | Freq: Four times a day (QID) | ORAL | Status: DC | PRN
  Administered 2023-10-03 – 2023-10-04 (×2): 1000 mg via ORAL
  Filled 2023-10-03 (×2): qty 2

## 2023-10-03 MED ORDER — FAMOTIDINE 20 MG PO TABS
20.0000 mg | ORAL_TABLET | Freq: Every day | ORAL | Status: DC
  Administered 2023-10-03 – 2023-10-05 (×3): 20 mg via ORAL
  Filled 2023-10-03 (×3): qty 1

## 2023-10-03 MED ORDER — ORAL CARE MOUTH RINSE: 15.0000 mL | OROMUCOSAL | Status: DC | PRN

## 2023-10-03 MED ORDER — ALBUTEROL SULFATE (2.5 MG/3ML) 0.083% IN NEBU
2.5000 mg | INHALATION_SOLUTION | Freq: Once | RESPIRATORY_TRACT | Status: AC
  Administered 2023-10-03: 2.5 mg via RESPIRATORY_TRACT
  Filled 2023-10-03: qty 3

## 2023-10-03 MED ORDER — ASPIRIN 81 MG PO CHEW
81.0000 mg | CHEWABLE_TABLET | Freq: Every day | ORAL | Status: DC
  Administered 2023-10-03 – 2023-10-05 (×3): 81 mg via ORAL
  Filled 2023-10-03 (×3): qty 1

## 2023-10-03 MED ORDER — EMPAGLIFLOZIN 25 MG PO TABS: 25.0000 mg | ORAL_TABLET | Freq: Every day | ORAL | Status: DC

## 2023-10-03 MED ORDER — DEXTROSE 50 % IV SOLN: 0.0000 mL | INTRAVENOUS | Status: DC | PRN

## 2023-10-03 MED ORDER — ONDANSETRON 4 MG PO TBDP
4.0000 mg | ORAL_TABLET | Freq: Three times a day (TID) | ORAL | Status: DC | PRN
  Administered 2023-10-03: 4 mg via ORAL
  Filled 2023-10-03: qty 1

## 2023-10-03 MED ORDER — CARVEDILOL 25 MG PO TABS
25.0000 mg | ORAL_TABLET | Freq: Two times a day (BID) | ORAL | Status: DC
  Administered 2023-10-04 – 2023-10-05 (×3): 25 mg via ORAL
  Filled 2023-10-03 (×3): qty 1

## 2023-10-03 MED ORDER — LACTATED RINGERS IV BOLUS
1000.0000 mL | Freq: Once | INTRAVENOUS | Status: AC
Start: 2023-10-03 — End: 2023-10-03
  Administered 2023-10-03: 1000 mL via INTRAVENOUS

## 2023-10-03 NOTE — Progress Notes (Signed)
Colin China, MD made aware of patient arrival to unit. Per MD, patient okay to travel to [place] without RN. No need for repeat EKG unless active chest pain.

## 2023-10-03 NOTE — Telephone Encounter (Signed)
Lori calls nurse line in regards to patient.   See telephone note from 10/14.  Lawson Fiscal reports he has not improved over the last 24 hours. She reports he is very weak and lethargic.   She reports he has not eaten "anything really" since yesterday. However, reports his cbgs are still running in the 200s-300s. She reports she has been trying to stay hydrated, however does not "feel like drinking."   Lawson Fiscal denies any SOB or fevers. She reports he is asking her to take him somewhere for help.   Lawson Fiscal advised to take him to the ED or UC near them for evaluation.

## 2023-10-03 NOTE — ED Provider Triage Note (Signed)
Emergency Medicine Provider Triage Evaluation Note  Colin Keller , a 67 y.o. male  was evaluated in triage.  Pt complains of SHOB, generalized weakness. Patient day 10 or so COVID +, not improving. No fevers. Decreased PO intake, weight loss. COPD, has not smoked x 3 days due to feeling SHOb. No CP.   Review of Systems  Positive: SHOB Negative: CP  Physical Exam  There were no vitals taken for this visit. Gen:   Awake, no distress   Resp:  Normal effort  MSK:   Moves extremities without difficulty  Other:  Chronically ill appearing, no lower ext edema  Medical Decision Making  Medically screening exam initiated at 1:32 PM.  Appropriate orders placed.  [name] was informed that the remainder of the evaluation will be completed by another provider, this initial triage assessment does not replace that evaluation, and the importance of remaining in the ED until their evaluation is complete.     Jeannie Fend, PA-C 10/03/23 1338

## 2023-10-03 NOTE — H&P (Signed)
Hospital Admission History and Physical Service Pager: [phone]  Patient name: Colin Keller Medical record number: [id number] Date of Birth: [birth year] Age: 67 y.o. Gender: male  Primary Care Provider: McDiarmid, Leighton Roach, MD Consultants: None Code Status: Full Preferred Emergency Contact:  Contact Information     Name Relation Home Work Mobile   Rivere,Lori Spouse [phone]      Other Contacts   None on File     Chief Complaint: Cough, shortness of breath, weak  Assessment and Plan: Colin Keller is a 67 y.o. male presenting with cough, shortness of breath, weakness. Differential for this patient's presentation of this includes DKA (most likely given acidosis, elevated anion gap, elevated glucose patient's labs and characteristic appearance), HHS (less likely given presence of acidosis with elevated glucose), COVID (likely causing some of his symptoms and contributed to development of DKA, especially in the setting of not using insulin), COPD exacerbation (less likely given lab evidence of acidosis and reassuring exam/CXR), and ACS (less likely given lab evidence of acidosis and reassuring exam/EKG). Assessment & Plan Diabetic ketoacidosis without coma associated with type 2 diabetes mellitus (HCC) Reassuringly, vitals stable and exam with normal mental status.  He has been started on Endo tool with insulin and fluids titration parameters.  Will continue this.  Will also order every 4 BMPs to monitor glucose and anion gap.  Continue Endo tool until anion gap closed x 2 and able to transition to subcutaneous insulin.  Will also add Zofran 4 mg every 8 as needed.  Hold home insulin, Jardiance, metformin. Hyperkalemia K as high as 6.4, likely in setting of insulin deficiency on top of renal dysfunction from hypovolemia.  EKG with tented T waves.  Reassuringly asymptomatic.  He is status post 2.5 mg albuterol neb and is receiving insulin.  Will monitor with  every 4 BMPs.  Can redose albuterol as needed and consider Lokelma. COVID As tested at home.  Will repeat respiratory panel here.  Vitals, exam, chest x-ray reassuring.  Likely cause, at least in part, of shortness of breath and chest pain.  Reassuringly EKG without acute ischemic changes.  Will monitor with symptomatic treatment as needed. AKI (acute kidney injury) (HCC) Creatinine 2.58 on admission.  Unclear baseline, though last creatinine was 1.98 five months ago.  Bump is likely prerenal in nature given likely osmotic diuresis. Fall, initial encounter Likely in the setting of acidosis and hypovolemia.  Reassured by overall unremarkable neurological status on exam.  However, given age and aspirin use, will obtain CT head to rule out acute bleed.  Will add on Tylenol 1000 every 6 as needed for pain. Chronic congestive heart failure, unspecified heart failure type (HCC) Most recent EF in 2019 50% with hypokinesis appreciated.  He has received 2.24 L in boluses and is currently on LR infusion per Endo tool.  Will add strict I/O's and can consider bladder scans as needed to ensure adequate fluid output.  Chronic and Stable Conditions: CAD-continue aspirin Hypertension-Coreg 25 twice daily, hydralazine 50 3 times daily GERD-Pepcid 20 daily BPH-Flomax 0.4 daily  FEN/GI: N.p.o. while on IV insulin and fluids VTE Prophylaxis: Lovenox  Disposition: Progressive  History of Present Illness:  Colin Keller is a 67 y.o. male presenting with cough, shortness of breath, weakness.  Symptoms started 1 week ago whenever he started feeling more short of breath and weak.  It was not until 4 days ago, however, that he started to have more chills and weakness  when he was at his grandkids sports game.  At that time, he knew something was wrong.  He then tested for COVID at home which was positive.  As time went on, he slowly continued to feel worse.  He was not able to hold any food or water down and was  vomiting up even ice chips.  He was having constipation.  No fevers.  Did have shortness of breath and some chest pain.  He did not have trouble walking, though he did faint, and he feels like he did hit his head.  He also bruised up his arms with the fall.  He was only out for a couple seconds he says.  He says he has been admitted before with elevated glucose "without ketoacidosis."  He was given insulin and was able to leave the hospital soon.  In the ED, he was found to be hypertensive with otherwise normal exam.  Potassium 5.7, creatinine 2.55, glucose 43, anion gap 28.  Hemoglobin 12.7 with WBC 11.4.  UA with 80 ketones.  VBG with pH 7.197, pCO2 16.6.  EKG with tented T waves.  He was started on Endo tool for DKA, albuterol (in addition to the insulin) for hyperkalemia, and called for admission for further management.  Review Of Systems: Per HPI.  Pertinent Past Medical History: Coronary artery disease Aneurysm of right subclavian BPH Chronic low back pain CKD stage III CAD status post 3 CABG Diabetic retinopathy T2DM Chronic pain GERD CHF with ejection fraction of 50% with hypokinesis of the lateral basal inferior walls Smoker smoking history Hyperkalemia HLD Hypertension Normocytic anemia PAD Spinal stenosis with polyneuropathy Remainder reviewed in history tab.   Pertinent Past Surgical History: Peripheral vascular balloon angioplasty Peripheral vascular arthrectomy Multiple coronary artery bypass grafts, reported 3 Remainder reviewed in history tab.   Pertinent Social History: Tobacco use: Current, smoked 2 pack/day for 20 years, now smokes a couple cigarettes per day for the last 5 years since heart surgery Alcohol use: Drinks 1 beer very rarely Other Substance use: None currently, used to smoke marijuana years ago Lives with wife, unfortunately his son used to live with him as well, though he passed from a fentanyl overdose  Pertinent Family History: Mother-heart  disease, diabetes, heart failure Father-heart disease Brother-sudden death Remainder reviewed in history tab.   Important Outpatient Medications: Tylenol 1000 every 6 as needed Aspirin 81 daily Coreg 25 mg twice daily Repatha 140 mg every 14 days Hydralazine 50 mg 3 times daily Jardiance 25 mg daily Insulin glargine 16-18 units daily Insulin lispro sliding scale with meal Metformin 1000 twice daily Pepcid 20 daily Sodium bicarbonate 650 daily Flomax 0.4 mg daily Vitamin B12 daily Remainder reviewed in medication history.   Objective: BP (!) 173/64   Pulse 90   Temp 97.6 F (36.4 C) (Oral)   Resp 18   Wt 62 kg   SpO2 100%   BMI 19.06 kg/m  Exam: General: Sitting up in bed, conversant, no acute distress Eyes: Extraocular movements grossly intact, no scleral icterus ENNTM: Mildly dry mucous membranes, grossly normal range of motion of neck Cardiovascular: Regular rate and rhythm without murmurs rubs or gallops appreciated Respiratory: Clear to auscultation bilaterally without wheezing rales or rhonchi Gastrointestinal: Soft, nontender, nondistended, normoactive bowel sounds MSK: Moves all extremities grossly equally Derm: No obvious rashes or lesions Neuro: No gross focal deficit Psych: Alert and oriented, mood and affect congruent  Labs:  CBC BMET  Recent Labs  Lab 10/03/23 1340 10/03/23 1621  WBC 11.4*  --   HGB 12.7* 13.6  HCT 39.9 40.0  PLT 282  --    Recent Labs  Lab 10/03/23 1731  NA 136  K 5.5*  CL 105  CO2 7*  BUN 56*  CREATININE 2.51*  GLUCOSE 430*  CALCIUM 9.1     BHB 8.52 UA with ketones 80, glucose greater than 500 VBG with pH 7.197, pCO2 16.6  EKG: My own interpretation (not copied from electronic read) normal sinus rhythm, T wave inversions appreciated in inferior leads though unchanged from prior, no new left bundle branch block or ST elevations  Imaging Studies Performed: CXR IMPRESSION: Postop chest.  No acute cardiopulmonary  disease.  Evette Georges, MD 10/03/2023, 7:43 PM PGY-2,  Family Medicine FPTS Intern pager: [phone], text pages welcome Secure chat group [facility] Teaching Service

## 2023-10-03 NOTE — ED Notes (Signed)
ED TO INPATIENT HANDOFF REPORT  ED Nurse Name and Phone #: Eulogio Bear, RN  S Name/Age/Gender [name] 67 y.o. male Room/Bed: 016C/016C  Code Status   Code Status: Full Code  Home/SNF/Other Home Patient oriented to: self, place, time, and situation Is this baseline? Yes   Triage Complete: Triage complete  Chief Complaint DKA (diabetic ketoacidosis) (HCC) [E11.10]  Triage Note Patient with hx COPD arrives with continued cough, shortness of breath, and generalized body aches since being dx with covid on 10/14.    Allergies Allergies  Allergen Reactions   Amlodipine Other (See Comments)    Leg swelling   Amitriptyline Other (See Comments)    Urinary retention   Nortriptyline Hcl Other (See Comments)    Vivid / bad dreams   Statins     Aches in Joints    Simvastatin Other (See Comments)    Arthralgias    Level of Care/Admitting Diagnosis ED Disposition     ED Disposition  Admit   Condition  --   Comment  Hospital Area: [facility] [[id number]]  Level of Care: Progressive [102]  Admit to Progressive based on following criteria: GI, ENDOCRINE disease patients with GI bleeding, acute liver failure or pancreatitis, stable with diabetic ketoacidosis or thyrotoxicosis (hypothyroid) state.  May admit patient to [facility] or [facility] if equivalent level of care is available:: No  Covid Evaluation: Asymptomatic - no recent exposure (last 10 days) testing not required  Diagnosis: DKA (diabetic ketoacidosis) [facility]) [[id number]]  Admitting Physician: Evette Georges [[id number]]  Attending Physician: Westley Chandler [[id number]]  Certification:: I certify this patient will need inpatient services for at least 2 midnights  Expected Medical Readiness: 10/07/2023          B Medical/Surgery History Past Medical History:  Diagnosis Date   Acute diastolic heart failure (HCC) 08/18/2018   Atherosclerosis of both carotid arteries 08/18/2020   Carotid dopplers  08/2019 40-59% bilateral stenosis and right subclavian stenosis   Bilateral carotid artery stenosis 08/18/2020   Right Carotid: Velocities in the right ICA are consistent with a 60-79%                 stenosis. Non-hemodynamically significant plaque <50% noted in                 the CCA. The ECA appears >50% stenosed. Proximal subclavian                 artery dilatation with elevated and turbulent flow just past the                 narrowing, measuring 1.6 cm.   Left Carotid: Velocities in the left ICA are    CHF (congestive heart failure) (HCC)    Chronic left shoulder pain 03/18/2021   Chronic low back pain without sciatica 10/16/2008   H/o chronic LBP with h/o lumbar disc herniation and intermittent radicular pain  Chronic pain, on stable doses of Norco 10/325 TID. MRI in 2003 showing SMALL CENTRAL DISC HERNIATION AT L4-5.  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      CKD (chronic kidney disease), stage III (HCC)    Coronary artery disease    Diabetes mellitus    Dyspnea    GERD (gastroesophageal reflux disease)    Hx of CABG 08/23/2018   LIMA to LAD, RIMA to RCA, SVG to OM3, EVH via right thigh   Hyperlipidemia    Hypertension  Hypertension associated with diabetes (HCC) 12/22/2008   Qualifier: Diagnosis of  By: Lafonda Mosses MD, Darl Pikes     Hypertensive nephropathy 08/18/2020   Insulin dependent type 2 diabetes mellitus (HCC) 12/22/2020   Long term (current) use of antithrombotics/antiplatelets 09/21/2022   Planned 78-months DAPT for DES for PAD on 09/21/2022.  Planned 6 months therapy. End 03/23/23.   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   LIMA to LAD, RIMA to RCA, SVG to OM3, [facility] via right thigh   Sleeping difficulties 09/08/2019   Subclavian artery stenosis, right (HCC) 08/18/2020   Carotid dopplers 08/2019 40-59% bilateral stenosis and right subclavian stenosis   Tobacco abuse    Tobacco abuse disorder    Qualifier: Diagnosis of  By: Lafonda Mosses MD, Darl Pikes     Past Surgical History:  Procedure Laterality Date   ABDOMINAL AORTOGRAM W/LOWER EXTREMITY N/A 09/21/2022   Procedure: ABDOMINAL AORTOGRAM W/LOWER EXTREMITY;  Surgeon: Iran Ouch, MD;  [facility] INVASIVE CV LAB;  Service: Cardiovascular;  Laterality: N/A;   CORONARY ARTERY BYPASS GRAFT N/A 08/23/2018   Procedure: CORONARY ARTERY BYPASS GRAFTING (CABG) x 3; -Left Internal Mammary Artery to Left Anterior Descending Artery, -Right Internal Mammary Artery to Right Coronary Artery, -Saphenous Vein Graft to Obtuse Marginal;  ENDOSCOPIC HARVEST GREATER SAPHENOUS VEIN  -Right Thigh;  Surgeon: Purcell Nails, MD;  Location: [facility] OR;  Service: Open Heart Surgery;  Laterality: N/A;   CORONARY PRESSURE/FFR STUDY N/A 08/20/2018   Procedure: INTRAVASCULAR PRESSURE WIRE/FFR STUDY;  Surgeon: Tonny Bollman, MD;  Location: [facility] INVASIVE CV LAB;  Service: Cardiovascular;  Laterality: N/A;   LEFT HEART CATH AND CORONARY ANGIOGRAPHY N/A 08/20/2018   Procedure: LEFT HEART CATH AND CORONARY ANGIOGRAPHY;  Surgeon: Tonny Bollman, MD;  Location[facility] INVASIVE CV LAB;  Service: Cardiovascular;  Laterality: N/A;   PERIPHERAL VASCULAR ATHERECTOMY Left 09/21/2022   Procedure: PERIPHERAL VASCULAR ATHERECTOMY;  Surgeon: Iran Ouch, MD;  Location: [facility] INVASIVE CV LAB;  Service: Cardiovascular;  Laterality: Left;  SFA   PERIPHERAL VASCULAR BALLOON ANGIOPLASTY Left 09/21/2022   Procedure: PERIPHERAL VASCULAR BALLOON ANGIOPLASTY;  Surgeon: Iran Ouch, MD;  Location: [facility] INVASIVE CV LAB;   Service: Cardiovascular;  Laterality: Left;  SFA   TEE WITHOUT CARDIOVERSION N/A 08/23/2018   Procedure: TRANSESOPHAGEAL ECHOCARDIOGRAM (TEE);  Surgeon: Purcell Nails, MD;  Location: [facility] OR;  Service: Open Heart Surgery;  Laterality: N/A;     A IV Location/Drains/Wounds Patient Lines/Drains/Airways Status     Active Line/Drains/Airways     Name Placement date Placement time Site Days   Peripheral IV 10/03/23 20 G Anterior;Proximal;Right Forearm 10/03/23  1616  Forearm  less than 1   Peripheral IV 10/03/23 20 G 1" Anterior;Left Forearm 10/03/23  1618  Forearm  less than 1  Intake/Output Last 24 hours No intake or output data in the 24 hours ending 10/03/23 2100  Labs/Imaging Results for orders placed or performed during the hospital encounter of 10/03/23 (from the past 48 hour(s))  Basic metabolic panel     Status: Abnormal   Collection Time: 10/03/23  1:40 PM  Result Value Ref Range   Sodium 138 135 - 145 mmol/L   Potassium 5.7 (H) 3.5 - 5.1 mmol/L   Chloride 101 98 - 111 mmol/L   CO2 9 (L) 22 - 32 mmol/L   Glucose, Bld 483 (H) 70 - 99 mg/dL    Comment: Glucose reference range applies only to samples taken after fasting for at least 8 hours.   BUN 54 (H) 8 - 23 mg/dL   Creatinine, Ser 4.09 (H) 0.61 - 1.24 mg/dL   Calcium 9.5 8.9 - 81.1 mg/dL   GFR, Estimated 26 (L) >60 mL/min    Comment: (NOTE) Calculated using the CKD-EPI Creatinine Equation (2021)    Anion gap 28 (H) 5 - 15    Comment: ELECTROLYTES REPEATED TO VERIFY Performed at [facility] Lab, [address], [place], [place] [address]   CBC with Differential     Status: Abnormal   Collection Time: 10/03/23  1:40 PM  Result Value Ref Range   WBC 11.4 (H) 4.0 - 10.5 K/uL   RBC 3.93 (L) 4.22 - 5.81 MIL/uL   Hemoglobin 12.7 (L) 13.0 - 17.0 g/dL   HCT 29.5 62.1 - 30.8 %   MCV 101.5 (H) 80.0 - 100.0 fL   MCH 32.3 26.0 - 34.0 pg   MCHC 31.8 30.0 - 36.0 g/dL   RDW 65.7 84.6 - 96.2 %   Platelets  282 150 - 400 K/uL   nRBC 0.0 0.0 - 0.2 %   Neutrophils Relative % 85 %   Neutro Abs 9.7 (H) 1.7 - 7.7 K/uL   Lymphocytes Relative 12 %   Lymphs Abs 1.4 0.7 - 4.0 K/uL   Monocytes Relative 2 %   Monocytes Absolute 0.2 0.1 - 1.0 K/uL   Eosinophils Relative 0 %   Eosinophils Absolute 0.0 0.0 - 0.5 K/uL   Basophils Relative 0 %   Basophils Absolute 0.1 0.0 - 0.1 K/uL   Immature Granulocytes 1 %   Abs Immature Granulocytes 0.07 0.00 - 0.07 K/uL    Comment: Performed at [facility] Lab, [address], [place], [place] [address]  Brain natriuretic peptide     Status: Abnormal   Collection Time: 10/03/23  1:40 PM  Result Value Ref Range   B Natriuretic Peptide 450.8 (H) 0.0 - 100.0 pg/mL    Comment: Performed at [facility], [address], [place], [place] [address]  Troponin I (High Sensitivity)     Status: Abnormal   Collection Time: 10/03/23  3:31 PM  Result Value Ref Range   Troponin I (High Sensitivity) 26 (H) <18 ng/L    Comment: (NOTE) Elevated high sensitivity troponin I (hsTnI) values and significant  changes across serial measurements may suggest ACS but many other  chronic and acute conditions are known to elevate hsTnI results.  Refer to the Links section for chest pain algorithms and additional  guidance. Performed at [facility] Lab, [address] [place], [place] [address]   Lipase, blood     Status: None   Collection Time: 10/03/23  3:31 PM  Result Value Ref Range   Lipase 22 11 - 51 U/L  Comment: Performed at [facility], [address] [place], [place] [address]  Comprehensive metabolic panel     Status: Abnormal   Collection Time: 10/03/23  3:31 PM  Result Value Ref Range   Sodium 137 135 - 145 mmol/L   Potassium 6.4 (HH) 3.5 - 5.1 mmol/L    Comment: CRITICAL RESULT CALLED TO, READ BACK BY AND VERIFIED WITH C WALKER RN 10/03/2023 1711 BNUNNERY   Chloride 103 98 - 111 mmol/L   CO2 7 (L) 22 - 32 mmol/L   Glucose, Bld 507 (HH) 70 - 99  mg/dL    Comment: CRITICAL RESULT CALLED TO, READ BACK BY AND VERIFIED WITH C WALKER RN 10/03/2023 1711 BNUNNERY Glucose reference range applies only to samples taken after fasting for at least 8 hours.    BUN 59 (H) 8 - 23 mg/dL   Creatinine, Ser 6.04 (H) 0.61 - 1.24 mg/dL   Calcium 9.6 8.9 - 54.0 mg/dL   Total Protein 7.2 6.5 - 8.1 g/dL   Albumin 3.6 3.5 - 5.0 g/dL   AST 18 15 - 41 U/L   ALT 16 0 - 44 U/L   Alkaline Phosphatase 66 38 - 126 U/L   Total Bilirubin 1.7 (H) 0.3 - 1.2 mg/dL   GFR, Estimated 26 (L) >60 mL/min    Comment: (NOTE) Calculated using the CKD-EPI Creatinine Equation (2021)    Anion gap 27 (H) 5 - 15    Comment: ELECTROLYTES REPEATED TO VERIFY Performed at [facility], [address], [place], [place] [address]   Beta-hydroxybutyric acid     Status: Abnormal   Collection Time: 10/03/23  3:33 PM  Result Value Ref Range   Beta-Hydroxybutyric Acid 8.52 (H) 0.05 - 0.27 mmol/L    Comment: RESULT CONFIRMED BY MANUAL DILUTION Performed at [facility] Lab, [address], [place], [place] [address]   Urinalysis, Routine w reflex microscopic -Urine, Clean Catch     Status: Abnormal   Collection Time: 10/03/23  4:17 PM  Result Value Ref Range   Color, Urine STRAW (A) YELLOW   APPearance CLEAR CLEAR   Specific Gravity, Urine 1.017 1.005 - 1.030   pH 5.0 5.0 - 8.0   Glucose, UA >=500 (A) NEGATIVE mg/dL   Hgb urine dipstick SMALL (A) NEGATIVE   Bilirubin Urine NEGATIVE NEGATIVE   Ketones, ur 80 (A) NEGATIVE mg/dL   Protein, ur 956 (A) NEGATIVE mg/dL   Nitrite NEGATIVE NEGATIVE   Leukocytes,Ua NEGATIVE NEGATIVE   RBC / HPF 0-5 0 - 5 RBC/hpf   WBC, UA 0-5 0 - 5 WBC/hpf   Bacteria, UA NONE SEEN NONE SEEN   Squamous Epithelial / HPF 0-5 0 - 5 /HPF    Comment: Performed at [facility], [address] [place], [place] [address]  I-Stat venous blood gas, [facility], [facility], [facility])     Status: Abnormal   Collection Time: 10/03/23  4:21 PM  Result Value Ref  Range   pH, Ven 7.197 (LL) 7.25 - 7.43   pCO2, Ven 16.6 (LL) 44 - 60 mmHg   pO2, Ven 130 (H) 32 - 45 mmHg   Bicarbonate 6.5 (L) 20.0 - 28.0 mmol/L   TCO2 7 (L) 22 - 32 mmol/L   O2 Saturation 98 %   Acid-base deficit 19.0 (H) 0.0 - 2.0 mmol/L   Sodium 134 (L) 135 - 145 mmol/L   Potassium 6.1 (H) 3.5 - 5.1 mmol/L   Calcium, Ion 1.20 1.15 -  1.40 mmol/L   HCT 40.0 39.0 - 52.0 %   Hemoglobin 13.6 13.0 - 17.0 g/dL   Sample type VENOUS    Comment NOTIFIED PHYSICIAN   CBG monitoring, ED     Status: Abnormal   Collection Time: 10/03/23  4:41 PM  Result Value Ref Range   Glucose-Capillary 482 (H) 70 - 99 mg/dL    Comment: Glucose reference range applies only to samples taken after fasting for at least 8 hours.  CBG monitoring, ED     Status: Abnormal   Collection Time: 10/03/23  5:21 PM  Result Value Ref Range   Glucose-Capillary 434 (H) 70 - 99 mg/dL    Comment: Glucose reference range applies only to samples taken after fasting for at least 8 hours.  Troponin I (High Sensitivity)     Status: Abnormal   Collection Time: 10/03/23  5:31 PM  Result Value Ref Range   Troponin I (High Sensitivity) 34 (H) <18 ng/L    Comment: (NOTE) Elevated high sensitivity troponin I (hsTnI) values and significant  changes across serial measurements may suggest ACS but many other  chronic and acute conditions are known to elevate hsTnI results.  Refer to the "Links" section for chest pain algorithms and additional  guidance. Performed at [facility], [address] [place], [place] [address]   Basic metabolic panel     Status: Abnormal   Collection Time: 10/03/23  5:31 PM  Result Value Ref Range   Sodium 136 135 - 145 mmol/L   Potassium 5.5 (H) 3.5 - 5.1 mmol/L   Chloride 105 98 - 111 mmol/L   CO2 7 (L) 22 - 32 mmol/L   Glucose, Bld 430 (H) 70 - 99 mg/dL    Comment: Glucose reference range applies only to samples taken after fasting for at least 8 hours.   BUN 56 (H) 8 - 23 mg/dL    Creatinine, Ser 6.04 (H) 0.61 - 1.24 mg/dL   Calcium 9.1 8.9 - 54.0 mg/dL   GFR, Estimated 27 (L) >60 mL/min    Comment: (NOTE) Calculated using the CKD-EPI Creatinine Equation (2021)    Anion gap 24 (H) 5 - 15    Comment: ELECTROLYTES REPEATED TO VERIFY Performed at [facility], [address], [place], [place] [address]   CBG monitoring, ED     Status: Abnormal   Collection Time: 10/03/23  5:56 PM  Result Value Ref Range   Glucose-Capillary 375 (H) 70 - 99 mg/dL    Comment: Glucose reference range applies only to samples taken after fasting for at least 8 hours.  Resp panel by RT-PCR (RSV, Flu A&B, Covid) Anterior Nasal Swab     Status: Abnormal   Collection Time: 10/03/23  6:43 PM   Specimen: Anterior Nasal Swab  Result Value Ref Range   SARS Coronavirus 2 by RT PCR POSITIVE (A) NEGATIVE   Influenza A by PCR NEGATIVE NEGATIVE   Influenza B by PCR NEGATIVE NEGATIVE    Comment: (NOTE) The Xpert Xpress SARS-CoV-2/FLU/RSV plus assay is intended as an aid in the diagnosis of influenza from Nasopharyngeal swab specimens and should not be used as a sole basis for treatment. Nasal washings and aspirates are unacceptable for Xpert Xpress SARS-CoV-2/FLU/RSV testing.  Fact Sheet for Patients: BloggerCourse.com  Fact Sheet for Healthcare Providers: SeriousBroker.it  This test is not yet approved or cleared by the [place] FDA and has been authorized for detection and/or diagnosis of SARS-CoV-2 by FDA under an Emergency  Use Authorization (EUA). This EUA will remain in effect (meaning this test can be used) for the duration of the COVID-19 declaration under Section 564(b)(1) of the Act, 21 U.S.C. section 360bbb-3(b)(1), unless the authorization is terminated or revoked.     Resp Syncytial Virus by PCR NEGATIVE NEGATIVE    Comment: (NOTE) Fact Sheet for Patients: BloggerCourse.com  Fact Sheet for  Healthcare Providers: SeriousBroker.it  This test is not yet approved or cleared by the [place] FDA and has been authorized for detection and/or diagnosis of SARS-CoV-2 by FDA under an Emergency Use Authorization (EUA). This EUA will remain in effect (meaning this test can be used) for the duration of the COVID-19 declaration under Section 564(b)(1) of the Act, 21 U.S.C. section 360bbb-3(b)(1), unless the authorization is terminated or revoked.  Performed at [facility], [address] [place], [place] [address]   CBG monitoring, ED     Status: Abnormal   Collection Time: 10/03/23  7:00 PM  Result Value Ref Range   Glucose-Capillary 332 (H) 70 - 99 mg/dL    Comment: Glucose reference range applies only to samples taken after fasting for at least 8 hours.  HIV Antibody (routine testing w rflx)     Status: None   Collection Time: 10/03/23  7:28 PM  Result Value Ref Range   HIV Screen 4th Generation wRfx Non Reactive Non Reactive    Comment: Performed at [facility], [address] [place], [place] [address]  CBG monitoring, ED     Status: Abnormal   Collection Time: 10/03/23  7:58 PM  Result Value Ref Range   Glucose-Capillary 286 (H) 70 - 99 mg/dL    Comment: Glucose reference range applies only to samples taken after fasting for at least 8 hours.   DG Chest 2 View  Result Date: 10/03/2023 CLINICAL DATA:  Shortness of breath EXAM: CHEST - 2 VIEW COMPARISON:  10/06/2018 FINDINGS: Sternal wires. No consolidation, pneumothorax or effusion. No edema. Normal cardiopericardial silhouette. Calcified aorta. Degenerative changes along the spine. IMPRESSION: Postop chest.  No acute cardiopulmonary disease. Electronically Signed   By: Karen Kays M.D.   On: 10/03/2023 16:27    Pending Labs Unresulted Labs (From admission, onward)     Start     Ordered   10/03/23 2047  Basic metabolic panel  ONCE - STAT,   STAT        10/03/23 2046    10/03/23 1848  Basic metabolic panel  Now then every 4 hours,   R      10/03/23 1848            Vitals/Pain Today's Vitals   10/03/23 1900 10/03/23 1945 10/03/23 2000 10/03/23 2016  BP:  (!) 175/67 (!) 168/64   Pulse:  85 82   Resp:   (!) 21   Temp:    97.9 F (36.6 C)  TempSrc:    Oral  SpO2:  100% 100%   Weight: 62 kg     PainSc:        Isolation Precautions No active isolations  Medications Medications  insulin regular, human (MYXREDLIN) 100 units/ 100 mL infusion (7 Units/hr Intravenous Rate/Dose Change 10/03/23 1959)  lactated ringers infusion ( Intravenous New Bag/Given 10/03/23 1646)  dextrose 5 % in lactated ringers infusion (0 mLs Intravenous Hold 10/03/23 1643)  dextrose 50 % solution 0-50 mL (has no administration in time range)  acetaminophen (TYLENOL) tablet 1,000 mg (1,000 mg Oral Given 10/03/23 2004)  aspirin  chewable tablet 81 mg (81 mg Oral Given 10/03/23 2004)  carvedilol (COREG) tablet 25 mg (has no administration in time range)  hydrALAZINE (APRESOLINE) tablet 50 mg (has no administration in time range)  famotidine (PEPCID) tablet 20 mg (20 mg Oral Given 10/03/23 2004)  tamsulosin (FLOMAX) capsule 0.4 mg (0.4 mg Oral Given 10/03/23 2004)  enoxaparin (LOVENOX) injection 30 mg (30 mg Subcutaneous Given 10/03/23 2005)  ondansetron (ZOFRAN-ODT) disintegrating tablet 4 mg (4 mg Oral Given 10/03/23 2005)  lactated ringers bolus 1,000 mL (0 mLs Intravenous Stopped 10/03/23 1726)  lactated ringers bolus 1,240 mL (0 mLs Intravenous Stopped 10/03/23 1800)  albuterol (PROVENTIL) (2.5 MG/3ML) 0.083% nebulizer solution 2.5 mg (2.5 mg Nebulization Given 10/03/23 1653)    Mobility walks with person assist     Focused Assessments Neuro Assessment Handoff:  Swallow screen pass? Yes          Neuro Assessment:   Neuro Checks:      Has TPA been given? No If patient is a Neuro Trauma and patient is going to OR before floor call report to 4N Charge nurse:  [phone] or [phone]   R Recommendations: See Admitting Provider Note  Report given to:   Additional Notes: pt  is AAOx4. Pt is on room air.

## 2023-10-03 NOTE — Assessment & Plan Note (Signed)
K as high as 6.4, likely in setting of insulin deficiency on top of renal dysfunction from hypovolemia.  EKG with tented T waves.  Reassuringly asymptomatic.  He is status post 2.5 mg albuterol neb and is receiving insulin.  Will monitor with every 4 BMPs.  Can redose albuterol as needed and consider Lokelma.

## 2023-10-03 NOTE — ED Provider Notes (Signed)
[place] [facility]ENC[facility] DE[facility]MENT AT [facility] Provider Note   CSN: [id number] Arrival date & time: 10/03/23  1329     History Chief Complaint  Patient presents with   Cough   Shortness of Breath    Colin Keller is a 67 y.o. male with history of diabetes, still tobacco user, BPH, GERD, hypertension, hyperlipidemia presents the emergency room today for evaluation of fatigue for the past week.  Patient reports that he was diagnosed with COVID and feels like he has been more fatigued and more short of breath.  He has not been taking his insulin like he supposed to because of how poorly he feels.  He reports some runny nose and nasal congestion as well as a sore throat.  He reports of chest pain but only with a cough.  Denies any diarrhea, constipation, or any abdominal pain.  Reports some nausea but is only having posttussive emesis with green mucus.  Denies any other type of vomiting.  He is unsure what other medications he is on other than the insulin.  He is still a daily tobacco user but denies any EtOH or illicit drug use.   Cough Associated symptoms: rhinorrhea and shortness of breath   Associated symptoms: no chills, no fever and no headaches   Shortness of Breath Associated symptoms: cough and vomiting (Posttussive)   Associated symptoms: no abdominal pain, no fever and no headaches        Home Medications Prior to Admission medications   Medication Sig Start Date End Date Taking? Authorizing Provider  aspirin 81 MG tablet Take 81 mg by mouth daily.    [provider]  carvedilol (COREG) 25 MG tablet Take 1 tablet (25 mg total) by mouth 2 (two) times daily with a meal. 04/11/23   Iran Ouch, MD  Continuous Blood Gluc Sensor (FREESTYLE LIBRE 2 SENSOR) MISC 1 Device by Does not apply route every 14 (fourteen) days.    [provider]  Cyanocobalamin (VITAMIN B 12 PO) Take 1 tablet by mouth daily.    [provider]  empagliflozin  (JARDIANCE) 25 MG TABS tablet Take 1 tablet (25 mg total) by mouth daily. 05/04/23   McDiarmid, Leighton Roach, MD  Evolocumab (REPATHA SURECLICK) 140 MG/ML SOAJ Inject 140 mg into the skin every 14 (fourteen) days. 12/16/22   Iran Ouch, MD  famotidine (PEPCID) 20 MG tablet Take 20 mg by mouth daily.    [provider]  hydrALAZINE (APRESOLINE) 50 MG tablet Take 1 tablet (50 mg total) by mouth 3 (three) times daily. 05/04/23 04/28/24  McDiarmid, Leighton Roach, MD  HYDROcodone-acetaminophen (NORCO) 10-325 MG tablet Take 1 tablet by mouth 2 (two) times daily as needed. 09/30/23 10/30/23  McDiarmid, Leighton Roach, MD  Insulin Glargine (BASAGLAR KWIKPEN) 100 UNIT/ML DIAL AND INJECT 16 TO 18 UNITS UNDER THE SKIN DAILY. Patient taking differently: 12-14 Units. 10/28/22   McDiarmid, Leighton Roach, MD  insulin lispro (HUMALOG) 100 UNIT/ML injection Inject 0.03-0.05 mLs (3-5 Units total) into the skin 2 (two) times daily with a meal. 3-4 units prior to breakfast, 4-5 units prior to evening meal Patient taking differently: Inject 3-5 Units into the skin See admin instructions. 3-4 units prior to breakfast, 4-5 units prior to evening meal as needed for high blood sugar 10/21/21   Moses Manners, MD  metFORMIN (GLUCOPHAGE) 500 MG tablet TAKE 2 TABLETS BY MOUTH TWICE DAILY WITH A MEAL 04/12/23   McDiarmid, Leighton Roach, MD  sodium bicarbonate 650  MG tablet Take 325 mg by mouth 2 (two) times daily. 08/03/23   [provider]  tamsulosin (FLOMAX) 0.4 MG CAPS capsule TAKE 1 CAPSULE BY MOUTH DAILY 08/15/23   McDiarmid, Leighton Roach, MD      Allergies    Amlodipine, Amitriptyline, Nortriptyline hcl, Statins, and Simvastatin    Review of Systems   Review of Systems  Constitutional:  Positive for fatigue. Negative for chills and fever.  HENT:  Positive for congestion and rhinorrhea.   Respiratory:  Positive for cough and shortness of breath.   Cardiovascular:  Negative for leg swelling.  Gastrointestinal:  Positive for nausea and  vomiting (Posttussive). Negative for abdominal pain, constipation and diarrhea.  Genitourinary:  Negative for dysuria and hematuria.  Neurological:  Negative for syncope and headaches.    Physical Exam Updated Vital Signs BP (!) 183/67   Pulse 86   Temp 97.7 F (36.5 C) (Oral)   Resp 16   SpO2 100%  Physical Exam Vitals and nursing note reviewed.  Constitutional:      Comments: Uncomfortable appearing, cachetic  HENT:     Mouth/Throat:     Mouth: Mucous membranes are dry.     Comments: No pharyngeal erythema or edema noted.  Uvula midline. Cardiovascular:     Rate and Rhythm: Normal rate.  Pulmonary:     Effort: Pulmonary effort is normal.     Breath sounds: Normal breath sounds.  Abdominal:     Palpations: Abdomen is soft.     Tenderness: There is no abdominal tenderness.  Musculoskeletal:     Cervical back: Normal range of motion.     Right lower leg: No edema.     Left lower leg: No edema.  Skin:    General: Skin is warm and dry.  Neurological:     General: No focal deficit present.     Mental Status: He is alert and oriented to person, place, and time.     ED Results / Procedures / Treatments   Labs (all labs ordered are listed, but only abnormal results are displayed) Labs Reviewed  BASIC METABOLIC PANEL - Abnormal; Notable for the following components:      Result Value   Potassium 5.7 (*)    CO2 9 (*)    Glucose, Bld 483 (*)    BUN 54 (*)    Creatinine, Ser 2.55 (*)    GFR, Estimated 26 (*)    Anion gap 28 (*)    All other components within normal limits  CBC WITH DIFFERENTIAL/PLATELET - Abnormal; Notable for the following components:   WBC 11.4 (*)    RBC 3.93 (*)    Hemoglobin 12.7 (*)    MCV 101.5 (*)    Neutro Abs 9.7 (*)    All other components within normal limits  BRAIN NATRIURETIC PEPTIDE - Abnormal; Notable for the following components:   B Natriuretic Peptide 450.8 (*)    All other components within normal limits  URINALYSIS, ROUTINE W  REFLEX MICROSCOPIC - Abnormal; Notable for the following components:   Color, Urine STRAW (*)    Glucose, UA >=500 (*)    Hgb urine dipstick SMALL (*)    Ketones, ur 80 (*)    Protein, ur 100 (*)    All other components within normal limits  I-STAT VENOUS BLOOD GAS, ED - Abnormal; Notable for the following components:   pH, Ven 7.197 (*)    pCO2, Ven 16.6 (*)    pO2, Ven 130 (*)  Bicarbonate 6.5 (*)    TCO2 7 (*)    Acid-base deficit 19.0 (*)    Sodium 134 (*)    Potassium 6.1 (*)    All other components within normal limits  CBG MONITORING, ED - Abnormal; Notable for the following components:   Glucose-Capillary 482 (*)    All other components within normal limits  BETA-HYDROXYBUTYRIC ACID  LIPASE, BLOOD  COMPREHENSIVE METABOLIC PANEL  TROPONIN I (HIGH SENSITIVITY)  TROPONIN I (HIGH SENSITIVITY)    EKG EKG Interpretation Date/Time:  Tuesday October 03 2023 13:41:35 EDT Ventricular Rate:  78 PR Interval:  152 QRS Duration:  104 QT Interval:  388 QTC Calculation: 442 R Axis:   90  Text Interpretation: Normal sinus rhythm Rightward axis ST-t wave abnormality similar morphology to 2019 Abnormal ECG Confirmed by Gerhard Munch [phone]) on 10/03/2023 1:45:23 PM  Radiology DG Chest 2 View  Result Date: 10/03/2023 CLINICAL DATA:  Shortness of breath EXAM: CHEST - 2 VIEW COMPARISON:  10/06/2018 FINDINGS: Sternal wires. No consolidation, pneumothorax or effusion. No edema. Normal cardiopericardial silhouette. Calcified aorta. Degenerative changes along the spine. IMPRESSION: Postop chest.  No acute cardiopulmonary disease. Electronically Signed   By: Karen Kays M.D.   On: 10/03/2023 16:27    Procedures .Critical Care  Performed by: Achille Rich, PA-C Authorized by: Achille Rich, PA-C   Critical care provider statement:    Critical care time (minutes):  45   Critical care was necessary to treat or prevent imminent or life-threatening deterioration of the following  conditions:  Endocrine crisis (DKA)   Critical care was time spent personally by me on the following activities:  Development of treatment plan with patient or surrogate, discussions with consultants, evaluation of patient's response to treatment, examination of patient, ordering and review of laboratory studies, ordering and review of radiographic studies, ordering and performing treatments and interventions, pulse oximetry, re-evaluation of patient's condition and review of old charts   Care discussed with: admitting provider      Medications Ordered in ED Medications  insulin regular, human (MYXREDLIN) 100 units/ 100 mL infusion (8 Units/hr Intravenous New Bag/Given 10/03/23 1653)  lactated ringers infusion ( Intravenous New Bag/Given 10/03/23 1646)  dextrose 5 % in lactated ringers infusion (0 mLs Intravenous Hold 10/03/23 1643)  dextrose 50 % solution 0-50 mL (has no administration in time range)  lactated ringers bolus 1,000 mL (1,000 mLs Intravenous New Bag/Given 10/03/23 1616)  lactated ringers bolus 1,240 mL (1,240 mLs Intravenous New Bag/Given 10/03/23 1644)  albuterol (PROVENTIL) (2.5 MG/3ML) 0.083% nebulizer solution 2.5 mg (2.5 mg Nebulization Given 10/03/23 1653)    ED Course/ Medical Decision Making/ A&P    Medical Decision Making Amount and/or Complexity of Data Reviewed Labs: ordered.  Risk Prescription drug management. Decision regarding hospitalization.   67 y.o. male presents to the ER for evaluation of fatigue, SOB. Differential diagnosis includes but is not limited to CVA, spinal cord injury, ACS, arrhythmia, syncope, orthostatic hypotension, sepsis, hypoglycemia, hypoxia, electrolyte disturbance, endocrine disorder, anemia, environmental exposure, polypharmacy. Vital signs elevated BP, otherwise unremarkable. Physical exam as noted above.   CXR shows Postop chest.  No acute cardiopulmonary disease. Per radiologist's read.   I independently reviewed and  interpreted the patient's labs. BNP is slightly elevated at 450.8, however the patient appears hypovolemic.  CBC does show slight leukocytosis 11.4 with a left shift.  Hemoglobin actually improved from previous.  May be acute phase reaction or hemoconcentration due to patient's significant dehydration.  Lipase within normal limits.  Urinalysis shows 80 ketones present with glucose in the urine.  No signs of infection.  Beta-hydroxybutyrate acid significantly elevated 8.52.  I-STAT VBG shows pH of 7.197.  Troponin pending.  Potassium on the i-STAT VBG shows 6.1 however was 5.7 with previous labs.  They unfortunately repeated the CMP before labs and insulin were started and now shows a potassium of 6.4.  I did discuss this with my attending does not recommend any other interventions other than giving the insulin and glucose and will reevaluated after they have received this.  I did give the patient some albuterol.  Given his AKI in addition to the DKA, I did give him more fluids than the regular bolus per kilogram that was recommended.  Given the patient's labs, he is in diabetic ketoacidosis.  Insulin and fluids were initiated.  Will continue to trend the potassium.  Given his AKI in addition to the DKA, he will need admission to the hospital.  I discussed this with patient and family at bedside who are amenable to admission.  Will admit to family medicine.  Portions of this report may have been transcribed using voice recognition software. Every effort was made to ensure accuracy; however, inadvertent computerized transcription errors may be present.   I discussed this case with my attending physician who cosigned this note including patient's presenting symptoms, physical exam, and planned diagnostics and interventions. Attending physician stated agreement with plan or made changes to plan which were implemented.    Final Clinical Impression(s) / ED Diagnoses Final diagnoses:  Diabetic ketoacidosis  without coma associated with type 2 diabetes mellitus (HCC)  Hyperkalemia  COVID  AKI (acute kidney injury) (HCC)  Fall, initial encounter  Chronic congestive heart failure, unspecified heart failure type [facility])    Rx / DC Orders ED Discharge Orders     None         Achille Rich, PA-C 10/03/23 2100    Benjiman Core, MD 10/03/23 [phone]

## 2023-10-03 NOTE — Plan of Care (Signed)
  Problem: Education: Goal: Knowledge of General Education information will improve Description: Including pain rating scale, medication(s)/side effects and non-pharmacologic comfort measures Outcome: Progressing   Problem: Clinical Measurements: Goal: Ability to maintain clinical measurements within normal limits will improve Outcome: Progressing   Problem: Clinical Measurements: Goal: Diagnostic test results will improve Outcome: Progressing   Problem: Clinical Measurements: Goal: Respiratory complications will improve Outcome: Progressing   Problem: Clinical Measurements: Goal: Cardiovascular complication will be avoided Outcome: Progressing   Problem: Activity: Goal: Risk for activity intolerance will decrease Outcome: Progressing

## 2023-10-03 NOTE — ED Triage Notes (Signed)
Patient with hx COPD arrives with continued cough, shortness of breath, and generalized body aches since being dx with covid on 10/14.

## 2023-10-03 NOTE — Assessment & Plan Note (Signed)
Reassuringly, vitals stable and exam with normal mental status.  He has been started on Endo tool with insulin and fluids titration parameters.  Will continue this.  Will also order every 4 BMPs to monitor glucose and anion gap.  Continue Endo tool until anion gap closed x 2 and able to transition to subcutaneous insulin.  Will also add Zofran 4 mg every 8 as needed.  Hold home insulin, Jardiance, metformin.

## 2023-10-03 NOTE — Progress Notes (Cosign Needed Addendum)
FMTS Brief Progress Note  S: On interview, patient sitting up in bed.  Pleasant.  Attentive to interview.  No new concerns at this time.  Denies chest pain.  O: BP (!) 168/64   Pulse 82   Temp 97.6 F (36.4 C) (Oral)   Resp (!) 21   Wt 62 kg   SpO2 100%   BMI 19.06 kg/m    Physical exam Constitutional: NAD, attentive to interview. Cardiovascular: WNL tele Respiratory: No increased work of breathing Abdominal: Soft MSK: ROM intact.  Neuro: No gross deficits. Psych: Appropriate mood and affect.  A/P: DKA - BMP Q4H - Monitor potassium, temporize as needed - Ordered close I/Os  - Can have sips with meds - Rest of plan per primary team  Tomie China, MD 10/03/2023, 8:12 PM PGY-1, [facility] Night Resident  Please page [phone] with questions.

## 2023-10-04 ENCOUNTER — Inpatient Hospital Stay (HOSPITAL_COMMUNITY): Payer: Medicare HMO

## 2023-10-04 ENCOUNTER — Encounter (HOSPITAL_COMMUNITY): Payer: Self-pay | Admitting: Family Medicine

## 2023-10-04 DIAGNOSIS — Z794 Long term (current) use of insulin: Secondary | ICD-10-CM | POA: Diagnosis not present

## 2023-10-04 DIAGNOSIS — N179 Acute kidney failure, unspecified: Secondary | ICD-10-CM | POA: Diagnosis not present

## 2023-10-04 DIAGNOSIS — I509 Heart failure, unspecified: Secondary | ICD-10-CM | POA: Diagnosis not present

## 2023-10-04 DIAGNOSIS — W19XXXA Unspecified fall, initial encounter: Secondary | ICD-10-CM

## 2023-10-04 DIAGNOSIS — U071 COVID-19: Secondary | ICD-10-CM | POA: Diagnosis not present

## 2023-10-04 DIAGNOSIS — R7989 Other specified abnormal findings of blood chemistry: Secondary | ICD-10-CM

## 2023-10-04 DIAGNOSIS — E875 Hyperkalemia: Secondary | ICD-10-CM

## 2023-10-04 HISTORY — DX: Unspecified fall, initial encounter: W19.XXXA

## 2023-10-04 HISTORY — DX: Acute kidney failure, unspecified: N17.9

## 2023-10-04 HISTORY — DX: Heart failure, unspecified: I50.9

## 2023-10-04 HISTORY — DX: COVID-19: U07.1

## 2023-10-04 LAB — HEPATIC FUNCTION PANEL
ALT: 13 U/L (ref 0–44)
AST: 17 U/L (ref 15–41)
Albumin: 2.7 g/dL — ABNORMAL LOW (ref 3.5–5.0)
Alkaline Phosphatase: 52 U/L (ref 38–126)
Bilirubin, Direct: <0.1 mg/dL (ref 0.0–0.2)
Total Bilirubin: 0.5 mg/dL (ref 0.3–1.2)
Total Protein: 5.7 g/dL — ABNORMAL LOW (ref 6.5–8.1)

## 2023-10-04 LAB — GLUCOSE, CAPILLARY
Glucose-Capillary: 126 mg/dL — ABNORMAL HIGH (ref 70–99)
Glucose-Capillary: 145 mg/dL — ABNORMAL HIGH (ref 70–99)
Glucose-Capillary: 148 mg/dL — ABNORMAL HIGH (ref 70–99)
Glucose-Capillary: 153 mg/dL — ABNORMAL HIGH (ref 70–99)
Glucose-Capillary: 162 mg/dL — ABNORMAL HIGH (ref 70–99)
Glucose-Capillary: 169 mg/dL — ABNORMAL HIGH (ref 70–99)
Glucose-Capillary: 174 mg/dL — ABNORMAL HIGH (ref 70–99)
Glucose-Capillary: 175 mg/dL — ABNORMAL HIGH (ref 70–99)
Glucose-Capillary: 186 mg/dL — ABNORMAL HIGH (ref 70–99)
Glucose-Capillary: 188 mg/dL — ABNORMAL HIGH (ref 70–99)
Glucose-Capillary: 195 mg/dL — ABNORMAL HIGH (ref 70–99)
Glucose-Capillary: 197 mg/dL — ABNORMAL HIGH (ref 70–99)
Glucose-Capillary: 249 mg/dL — ABNORMAL HIGH (ref 70–99)

## 2023-10-04 LAB — BASIC METABOLIC PANEL
Anion gap: 13 (ref 5–15)
Anion gap: 8 (ref 5–15)
Anion gap: 8 (ref 5–15)
BUN: 53 mg/dL — ABNORMAL HIGH (ref 8–23)
BUN: 50 mg/dL — ABNORMAL HIGH (ref 8–23)
BUN: 47 mg/dL — ABNORMAL HIGH (ref 8–23)
CO2: 17 mmol/L — ABNORMAL LOW (ref 22–32)
CO2: 21 mmol/L — ABNORMAL LOW (ref 22–32)
CO2: 21 mmol/L — ABNORMAL LOW (ref 22–32)
Calcium: 9.2 mg/dL (ref 8.9–10.3)
Calcium: 9 mg/dL (ref 8.9–10.3)
Calcium: 9.1 mg/dL (ref 8.9–10.3)
Chloride: 107 mmol/L (ref 98–111)
Chloride: 110 mmol/L (ref 98–111)
Chloride: 110 mmol/L (ref 98–111)
Creatinine, Ser: 2.12 mg/dL — ABNORMAL HIGH (ref 0.61–1.24)
Creatinine, Ser: 1.77 mg/dL — ABNORMAL HIGH (ref 0.61–1.24)
Creatinine, Ser: 1.71 mg/dL — ABNORMAL HIGH (ref 0.61–1.24)
GFR, Estimated: 33 mL/min — ABNORMAL LOW (ref >60)
GFR, Estimated: 41 mL/min — ABNORMAL LOW (ref >60)
GFR, Estimated: 43 mL/min — ABNORMAL LOW (ref >60)
Glucose, Bld: 235 mg/dL — ABNORMAL HIGH (ref 70–99)
Glucose, Bld: 173 mg/dL — ABNORMAL HIGH (ref 70–99)
Glucose, Bld: 138 mg/dL — ABNORMAL HIGH (ref 70–99)
Potassium: 4.8 mmol/L (ref 3.5–5.1)
Potassium: 3.9 mmol/L (ref 3.5–5.1)
Potassium: 4.1 mmol/L (ref 3.5–5.1)
Sodium: 137 mmol/L (ref 135–145)
Sodium: 139 mmol/L (ref 135–145)
Sodium: 139 mmol/L (ref 135–145)

## 2023-10-04 LAB — BETA-HYDROXYBUTYRIC ACID: Beta-Hydroxybutyric Acid: >8 mmol/L — ABNORMAL HIGH (ref 0.05–0.27)

## 2023-10-04 LAB — TROPONIN I (HIGH SENSITIVITY)
Troponin I (High Sensitivity): 173 ng/L (ref <18)
Troponin I (High Sensitivity): 148 ng/L (ref <18)

## 2023-10-04 LAB — HEMOGLOBIN A1C
Hgb A1c MFr Bld: 10.7 % — ABNORMAL HIGH (ref 4.8–5.6)
Mean Plasma Glucose: 260.39 mg/dL

## 2023-10-04 MED ORDER — DEXTROMETHORPHAN POLISTIREX ER 30 MG/5ML PO SUER
15.0000 mg | Freq: Every evening | ORAL | Status: DC | PRN
  Administered 2023-10-05: 15 mg via ORAL
  Filled 2023-10-04 (×2): qty 5

## 2023-10-04 MED ORDER — INSULIN GLARGINE-YFGN 100 UNIT/ML ~~LOC~~ SOLN
20.0000 [IU] | Freq: Every day | SUBCUTANEOUS | Status: DC
  Administered 2023-10-04 – 2023-10-05 (×2): 20 [IU] via SUBCUTANEOUS
  Filled 2023-10-04 (×2): qty 0.2

## 2023-10-04 MED ORDER — INSULIN ASPART 100 UNIT/ML IJ SOLN
0.0000 [IU] | Freq: Three times a day (TID) | INTRAMUSCULAR | Status: DC
  Administered 2023-10-05: 3 [IU] via SUBCUTANEOUS

## 2023-10-04 NOTE — Hospital Course (Signed)
Colin Keller is a 67 y.o. male who was admitted to the [facility] at [facility] for nausea, vomiting, nonspecific chest pain. Hospital course is outlined below by problem.   Diabetic ketoacidosis without coma On presentation patient with stable vitals and.  He received IV fluids and insulin until his anion gap closed x 2, at which time he was transitioned to subcutaneous insulin.  His home insulin, Jardiance, metformin was held during this management.  Metformin was restarted by time of discharge.  COVID-19 Patient initially tested positive at home and also tested positive for COVID on arrival to the hospital.  He was outside of the window for Paxlovid and so this was not offered.  He did not require oxygen throughout his hospitalization.  Hyperkalemia Potassium on admission was 6.4, likely in the setting of insulin deficiency on top of renal dysfunction from hypovolemia.  He remained asymptomatic and potassium did downtrend to 3.7 by time of discharge.  Elevated troponin Likely due to demand ischemia.  Did downtrend on recheck.  Cardiology weighed in and agreed that patient did not need further trending of troponins, was stable for discharge.  Patient without chest pain or other concerning symptoms and was discharged in stable condition.  Other conditions that were chronic and stable:  CAD -continue aspirin Hypertension -Coreg 25 twice daily, hydralazine 50 mg 3 times daily GERD -Pepcid 20 mg daily BPH-Flomax 0.4 mg daily  Issues for follow up: Consider starting Jardiance back; held at this time for concern of DKA

## 2023-10-04 NOTE — Progress Notes (Signed)
Daily Progress Note Intern Pager: [phone]  Patient name: Colin Keller Medical record number: [id number] Date of birth: 01/31/1956 Age: 67 y.o. Gender: male  Primary Care Provider: McDiarmid, Colin Roach, MD Consultants: none Code Status: Full  Pt Overview and Major Events to Date:  10/22: Admitted  Assessment and Plan:  Colin Keller is a 67 year old male presenting with DKA in the setting of COVID-19 infection.  He has a history of type 2 diabetes, CHF, CAD, hypertension, GERD, BPH.  He is doing well and his anion gap has closed x 2, we have transitioned to insulin injections with sliding scale. Assessment & Plan DKA (diabetic ketoacidosis) (HCC) Blood sugars less than 200, anion gap closed x 2.  Discontinued Endo tool, insulin regimen as detailed below. -CBG checks with meals and at bedtime -Insulin glargine 20 units daily -Moderate sliding scale insulin -Carb modified diet AKI (acute kidney injury) (HCC) Creatinine today was 1.7 1 AM BMP to close the loop Chronic congestive heart failure (HCC) Patient remained euvolemic, discontinued fluids when discontinuing Endo tool. COVID Nausea and vomiting have remitted, otherwise patient feels well.  Continue with supportive care Fall (Resolved: 10/04/2023) Likely in the setting of acidosis and hypovolemia.  Reassured by overall unremarkable neurological status on exam.  However, given age and aspirin use, will obtain CT head to rule out acute bleed.  Will add on Tylenol 1000 every 6 as needed for pain. Elevated troponin Troponins peaked at 173 and then trended downward.  Cardiology was briefly consulted and felt this was demand ischemia.  EKG is reassuring   Chronic and Stable Problems:  CAD -continue aspirin Hypertension -Coreg 25 twice daily, hydralazine 50 mg 3 times daily GERD -Pepcid 20 mg daily BPH-Flomax 0.4 mg daily  FEN/GI: Carb modified PPx: Lovenox Dispo:Home pending clinical improvement .   Subjective:  Patient  awake, resting comfortably in bed on examination this morning.  He endorses that he is feeling much better today and inquired whether he would be able to return home today.  He reports that his nausea and vomiting have resolved and he is looking forward to being able to eat.  He denies any chest pain, shortness of breath at this time.  Objective: Temp:  [97.6 F (36.4 C)-98.7 F (37.1 C)] 97.7 F (36.5 C) (10/23 0845) Pulse Rate:  [61-93] 61 (10/23 0845) Resp:  [13-22] 14 (10/23 0845) BP: (119-195)/(57-100) 119/84 (10/23 0845) SpO2:  [98 %-100 %] 100 % (10/23 0845) Weight:  [56.1 kg-62 kg] 56.1 kg (10/22 2200) Physical Exam: General: Alert, oriented.  No obvious distress Cardiovascular: RRR, no M/R/G Respiratory: CTAB, no increased work of breathing Abdomen: Flat, soft, nontender Extremities: 2+ peripheral pulses, moves all 4 spontaneously.  Cap refill within normal limits  Laboratory: Most recent CBC Lab Results  Component Value Date   WBC 11.4 (H) 10/03/2023   HGB 13.6 10/03/2023   HCT 40.0 10/03/2023   MCV 101.5 (H) 10/03/2023   PLT 282 10/03/2023   Most recent BMP    Latest Ref Rng & Units 10/04/2023    6:32 AM  BMP  Glucose 70 - 99 mg/dL 147   BUN 8 - 23 mg/dL 47   Creatinine 8.29 - 1.24 mg/dL 5.62   Sodium 130 - 865 mmol/L 139   Potassium 3.5 - 5.1 mmol/L 4.1   Chloride 98 - 111 mmol/L 110   CO2 22 - 32 mmol/L 21   Calcium 8.9 - 10.3 mg/dL 9.1    Troponin: 784---> 148  Gerrit Heck, DO 10/04/2023, 9:28 AM  PGY-1, [facility] FPTS Intern pager: [phone], text pages welcome Secure chat group [facility] Teaching Service

## 2023-10-04 NOTE — Progress Notes (Signed)
Presents with c/o SOB, weakness. Pt with DKA, AKI, positive Covid.   10/04/23 1518  TOC Brief Assessment  Insurance and Status Reviewed  Patient has primary care physician Yes  Home environment has been reviewed From home with wife.  Prior level of function: PTA independent with ADL's, no DME usage  Prior/Current Home Services No current home services  Social Determinants of Health Reivew SDOH reviewed no interventions necessary  Readmission risk has been reviewed No  Transition of care needs no transition of care needs at this time

## 2023-10-04 NOTE — Treatment Plan (Signed)
Trop up to 170s, EKG overall unchanged from previous. Called cardiology to discuss current plan, no chest pain at present. Per cards, stop trending troponins, monitor sx-demand ischemia likely.   Rosland Riding Geophysical data processor

## 2023-10-04 NOTE — Telephone Encounter (Signed)
Reviewed and agree.

## 2023-10-04 NOTE — Assessment & Plan Note (Signed)
Most recent EF in 2019 50% with hypokinesis appreciated.  He has received 2.24 L in boluses and is currently on LR infusion per Endo tool.  Will add strict I/O's and can consider bladder scans as needed to ensure adequate fluid output.

## 2023-10-04 NOTE — Assessment & Plan Note (Signed)
Likely in the setting of acidosis and hypovolemia.  Reassured by overall unremarkable neurological status on exam.  However, given age and aspirin use, will obtain CT head to rule out acute bleed.  Will add on Tylenol 1000 every 6 as needed for pain.

## 2023-10-04 NOTE — Assessment & Plan Note (Addendum)
Blood sugars less than 200, anion gap closed x 2.  Discontinued Endo tool, insulin regimen as detailed below. -CBG checks with meals and at bedtime -Insulin glargine 20 units daily -Moderate sliding scale insulin -Carb modified diet

## 2023-10-04 NOTE — Assessment & Plan Note (Addendum)
Creatinine today was 1.7 1 AM BMP to close the loop

## 2023-10-04 NOTE — Assessment & Plan Note (Signed)
Creatinine 2.58 on admission.  Unclear baseline, though last creatinine was 1.98 five months ago.  Bump is likely prerenal in nature given likely osmotic diuresis.

## 2023-10-04 NOTE — Assessment & Plan Note (Addendum)
Nausea and vomiting have remitted, otherwise patient feels well.  Continue with supportive care

## 2023-10-04 NOTE — Assessment & Plan Note (Signed)
Troponins peaked at 173 and then trended downward.  Cardiology was briefly consulted and felt this was demand ischemia.  EKG is reassuring

## 2023-10-04 NOTE — Telephone Encounter (Signed)
Reviewed

## 2023-10-04 NOTE — Assessment & Plan Note (Addendum)
Patient remained euvolemic, discontinued fluids when discontinuing Endo tool.

## 2023-10-04 NOTE — Assessment & Plan Note (Signed)
As tested at home.  Will repeat respiratory panel here.  Vitals, exam, chest x-ray reassuring.  Likely cause, at least in part, of shortness of breath and chest pain.  Reassuringly EKG without acute ischemic changes.  Will monitor with symptomatic treatment as needed.

## 2023-10-05 DIAGNOSIS — E0810 Diabetes mellitus due to underlying condition with ketoacidosis without coma: Secondary | ICD-10-CM | POA: Diagnosis not present

## 2023-10-05 DIAGNOSIS — E1110 Type 2 diabetes mellitus with ketoacidosis without coma: Principal | ICD-10-CM

## 2023-10-05 LAB — BASIC METABOLIC PANEL WITH GFR
Anion gap: 7 (ref 5–15)
BUN: 32 mg/dL — ABNORMAL HIGH (ref 8–23)
CO2: 23 mmol/L (ref 22–32)
Calcium: 9.2 mg/dL (ref 8.9–10.3)
Chloride: 109 mmol/L (ref 98–111)
Creatinine, Ser: 1.34 mg/dL — ABNORMAL HIGH (ref 0.61–1.24)
GFR, Estimated: 57 mL/min — ABNORMAL LOW
Glucose, Bld: 132 mg/dL — ABNORMAL HIGH (ref 70–99)
Potassium: 3.7 mmol/L (ref 3.5–5.1)
Sodium: 139 mmol/L (ref 135–145)

## 2023-10-05 LAB — GLUCOSE, CAPILLARY
Glucose-Capillary: 110 mg/dL — ABNORMAL HIGH (ref 70–99)
Glucose-Capillary: 172 mg/dL — ABNORMAL HIGH (ref 70–99)

## 2023-10-05 MED ORDER — BASAGLAR KWIKPEN 100 UNIT/ML ~~LOC~~ SOPN: 20.0000 [IU] | PEN_INJECTOR | Freq: Every day | SUBCUTANEOUS | Status: DC

## 2023-10-05 NOTE — Assessment & Plan Note (Deleted)
Most recent CBG 110.  Anion gap initially elevated on admission is now closed x4. Insulin as below. -CBG checks with meals and at bedtime -Insulin glargine 20 units daily -Moderate sliding scale insulin -Carb modified diet

## 2023-10-05 NOTE — Discharge Summary (Addendum)
Family Medicine Teaching [facility] Discharge Summary  Patient name: Colin Keller Medical record number: [id number] Date of birth: 1956-04-10 Age: 67 y.o. Gender: male Date of Admission: 10/03/2023  Date of Discharge: 10/05/2023 Admitting Physician: Evette Georges, MD  Primary Care Provider: McDiarmid, Leighton Roach, MD Consultants: None  Indication for Hospitalization: Cough, shortness of breath, weakness  Brief Hospital Course:  DIONISIOS THIEU is a 67 y.o. male who was admitted to the [facility] Medicine Teaching Service at [facility] for nausea, vomiting, nonspecific chest pain. Hospital course is outlined below by problem.   Diabetic ketoacidosis without coma On presentation patient with stable vitals though with evidence of elevated glucose to 500s and acidosis with pH 7.1. He received IV fluids and IV insulin until his anion gap closed x 2, at which time he was able to take p.o. and transition to subcutaneous insulin.  His home insulin, Jardiance, and metformin were held over admission.  Metformin and home insulin were restarted by time of discharge.  COVID-19 Patient initially tested positive at home and also tested positive for COVID on arrival to the hospital.  He was outside of the window for Paxlovid and so this was not offered.  Chest x-ray stable.  He did not require oxygen throughout his hospitalization.  Hyperkalemia Potassium on admission was 6.4, likely in the setting of insulin deficiency on top of renal dysfunction from hypovolemia.  He remained asymptomatic and potassium did downtrend to 3.7 by time of discharge.  Elevated troponin Likely due to demand ischemia.  Did downtrend on recheck.  Cardiology weighed in and agreed that patient did not need further trending of troponins, was stable for discharge.  Patient without chest pain or other concerning symptoms and was discharged in stable condition.  Other conditions that were chronic and stable:  CAD -continue  aspirin Hypertension -Coreg 25 twice daily, hydralazine 50 mg 3 times daily GERD -Pepcid 20 mg daily BPH-Flomax 0.4 mg daily  Issues for follow up: Consider starting Jardiance back; held at this time for concern of euglycemic DKA    Discharge Diagnoses/Problem List:  Principal Problem:   DKA (diabetic ketoacidosis) (HCC) Active Problems:   AKI (acute kidney injury) (HCC)   Chronic congestive heart failure (HCC)   COVID   Elevated troponin  Disposition: Home  Discharge Condition: Stable  Discharge Exam:  General: Well-appearing, pleasant, no acute distress Cardiovascular: RRR, no murmurs Respiratory: CTA bilaterally, very good air movement, no increased work of breathing on room air Abdomen: Normoactive bowel sounds, soft, nontender Extremities: Moves all equally  Significant Procedures: None  Significant Labs and Imaging:      Latest Ref Rng & Units 10/03/2023    4:21 PM 10/03/2023    1:40 PM 04/13/2023   12:09 PM  CBC  WBC 4.0 - 10.5 K/uL  11.4  8.7   Hemoglobin 13.0 - 17.0 g/dL 57.8  46.9  9.7   Hematocrit 39.0 - 52.0 % 40.0  39.9  29.4   Platelets 150 - 400 K/uL  282  238     Recent Labs  Lab 10/03/23 2035 10/03/23 2242 10/04/23 0325 10/04/23 0632 10/04/23 1047 10/05/23 0404  NA 140 137 139 139  --  139  K 4.2 4.8 3.9 4.1  --  3.7  CL 108 107 110 110  --  109  CO2 12* 17* 21* 21*  --  23  GLUCOSE 275* 235* 173* 138*  --  132*  BUN 53* 53* 50* 47*  --  32*  CREATININE 2.34* 2.12* 1.77* 1.71*  --  1.34*  CALCIUM 9.3 9.2 9.0 9.1  --  9.2  ALKPHOS  --   --   --   --  52  --   AST  --   --   --   --  17  --   ALT  --   --   --   --  13  --   ALBUMIN  --   --   --   --  2.7*  --    Troponin 76> 173> 148 A1c 10.7  ABG    Component Value Date/Time   PHART 7.307 (L) 08/23/2018 1726   PCO2ART 38.1 08/23/2018 1726   PO2ART 89.0 08/23/2018 1726   HCO3 6.5 (L) 10/03/2023 1621   TCO2 7 (L) 10/03/2023 1621   ACIDBASEDEF 19.0 (H) 10/03/2023 1621   O2SAT  98 10/03/2023 1621    Results/Tests Pending at Time of Discharge: None  Discharge Medications:  Allergies as of 10/05/2023       Reactions   Amlodipine Other (See Comments)   Leg swelling   Amitriptyline Other (See Comments)   Urinary retention   Nortriptyline Hcl Other (See Comments)   Vivid / bad dreams   Statins    Aches in Joints    Simvastatin Other (See Comments)   Arthralgias        Medication List     STOP taking these medications    empagliflozin 25 MG Tabs tablet Commonly known as: JARDIANCE   HYDROcodone-acetaminophen 10-325 MG tablet Commonly known as: NORCO       TAKE these medications    acetaminophen 500 MG tablet Commonly known as: TYLENOL Take 1,000 mg by mouth once as needed for moderate pain (pain score 4-6).   aspirin 81 MG tablet Take 81 mg by mouth daily.   Basaglar KwikPen 100 UNIT/ML Inject 20 Units into the skin daily. What changed: See the new instructions.   carvedilol 25 MG tablet Commonly known as: COREG Take 1 tablet (25 mg total) by mouth 2 (two) times daily with a meal.   famotidine 20 MG tablet Commonly known as: PEPCID Take 20 mg by mouth daily.   FreeStyle Libre 2 Sensor Misc 1 Device by Does not apply route every 14 (fourteen) days.   hydrALAZINE 50 MG tablet Commonly known as: APRESOLINE Take 1 tablet (50 mg total) by mouth 3 (three) times daily.   insulin lispro 100 UNIT/ML injection Commonly known as: HUMALOG Inject 0.03-0.05 mLs (3-5 Units total) into the skin 2 (two) times daily with a meal. 3-4 units prior to breakfast, 4-5 units prior to evening meal What changed:  when to take this additional instructions   metFORMIN 500 MG tablet Commonly known as: GLUCOPHAGE TAKE 2 TABLETS BY MOUTH TWICE DAILY WITH A MEAL What changed: See the new instructions.   Repatha SureClick 140 MG/ML Soaj Generic drug: Evolocumab Inject 140 mg into the skin every 14 (fourteen) days.   sodium bicarbonate 650 MG  tablet Take 325 mg by mouth 2 (two) times daily.   tamsulosin 0.4 MG Caps capsule Commonly known as: FLOMAX TAKE 1 CAPSULE BY MOUTH DAILY   VITAMIN B 12 PO Take 1 tablet by mouth daily.        Discharge Instructions: Please refer to Patient Instructions section of EMR for full details.  Patient was counseled important signs and symptoms that should prompt return to medical care, changes in medications, dietary instructions, activity restrictions, and follow up  appointments.   Follow-Up Appointments:  Follow-up Information     McDiarmid, Leighton Roach, MD. Schedule an appointment as soon as possible for a visit.   Specialty: Family Medicine Why: Make an appointment for hospital follow up ASAP Contact information: [address] [place] [address] [phone]                 Cyndia Skeeters, DO 10/05/2023, 5:28 PM PGY-1, [facility] Family Medicine   I agree with the assessment and plan as documented above. Janeal Holmes, MD [facility] Family Medicine

## 2023-10-05 NOTE — Assessment & Plan Note (Deleted)
Creatinine 1.34 this AM, down from 1.71. - monitor with AM BMP - fluids?

## 2023-10-05 NOTE — Discharge Instructions (Addendum)
Dear Colin Keller,  Thank you for letting us participate in your care. You were hospitalized for weakness and shortness of breath and diagnosed with DKA (diabetic ketoacidosis) (HCC) and COVID. You were treated with insulin and fluids.   POST-HOSPITAL & CARE INSTRUCTIONS Be sure to take all of your medications as listed in this packet. Go to your follow up appointments (listed below)  DOCTOR'S APPOINTMENT   Future Appointments  Date Time Provider Department Center  08/16/2024  8:45 AM [facility] ANNUAL WELLNESS VISIT [facility]    Follow-up Information     McDiarmid, Leighton Roach, MD. Schedule an appointment as soon as possible for a visit.   Specialty: Family Medicine Why: Make an appointment for hospital follow up ASAP Contact information: [address] [place] [address] [phone]                 Take care and be well!  Family Medicine Teaching Service Inpatient Team [place]  [facility]  [address] [place], [place] [address] [phone]

## 2023-10-05 NOTE — Assessment & Plan Note (Deleted)
Nausea and vomiting have remitted, otherwise patient feels well.  Continue with supportive care

## 2023-10-06 ENCOUNTER — Telehealth: Payer: Self-pay

## 2023-10-06 NOTE — Transitions of Care (Post Inpatient/ED Visit) (Signed)
10/06/2023  Name: Colin Keller MRN: [id number] DOB: 1956/09/12  Today's TOC FU Call Status: Today's TOC FU Call Status:: Unsuccessful Call (1st Attempt) Unsuccessful Call (1st Attempt) Date: 10/06/23  Attempted to reach the patient regarding the most recent Inpatient/ED visit.  Follow Up Plan: Additional outreach attempts will be made to reach the patient to complete the Transitions of Care (Post Inpatient/ED visit) call.   Deidre Ala, RN Medical illustrator [facility] [phone]

## 2023-10-06 NOTE — Transitions of Care (Post Inpatient/ED Visit) (Signed)
10/06/2023  Name: Colin Keller MRN: [id number] DOB: Jul 29, 1956  Today's TOC FU Call Status: Today's TOC FU Call Status:: Successful TOC FU Call Completed TOC FU Call Complete Date: 10/06/23 Patient's Name and Date of Birth confirmed.  Transition Care Management Follow-up Telephone Call Date of Discharge: 10/05/23 Discharge Facility: [facility]) Type of Discharge: Inpatient Admission Primary Inpatient Discharge Diagnosis:: DKA How have you been since you were released from the hospital?: Better Any questions or concerns?: No  Items Reviewed: Did you receive and understand the discharge instructions provided?: Yes Medications obtained,verified, and reconciled?: Yes (Medications Reviewed) Any new allergies since your discharge?: No Dietary orders reviewed?: NA Do you have support at home?: Yes People in Home: spouse Name of Support/Comfort Primary Source: Lawson Fiscal  Medications Reviewed Today: Medications Reviewed Today     Reviewed by Redge Gainer, RN (Case Manager) on 10/06/23 at 1559  Med List Status: <None>   Medication Order Taking? Sig Documenting Provider Last Dose Status Informant  acetaminophen (TYLENOL) 500 MG tablet [id number] No Take 1,000 mg by mouth once as needed for moderate pain (pain score 4-6). [provider] 10/03/2023 Active Self  aspirin 81 MG tablet [id number] No Take 81 mg by mouth daily. [provider] Past Month Active Self  carvedilol (COREG) 25 MG tablet [id number] No Take 1 tablet (25 mg total) by mouth 2 (two) times daily with a meal. Iran Ouch, MD Past Month Active Self  Continuous Blood Gluc Sensor (FREESTYLE LIBRE 2 SENSOR) MISC [id number] No 1 Device by Does not apply route every 14 (fourteen) days. [provider] Taking Active Self  Cyanocobalamin (VITAMIN B 12 PO) [id number] No Take 1 tablet by mouth daily. [provider] Past Month Active Self  Evolocumab (REPATHA SURECLICK) 140 MG/ML SOAJ  [id number] No Inject 140 mg into the skin every 14 (fourteen) days. Iran Ouch, MD unknown Active Self           Med Note (CARD, AMY L   Tue Oct 03, 2023  5:36 PM) Patient hasn't taken in about a month due to being in the "donut hole".  famotidine (PEPCID) 20 MG tablet [id number] No Take 20 mg by mouth daily. [provider] Past Week Active Self  hydrALAZINE (APRESOLINE) 50 MG tablet [id number] No Take 1 tablet (50 mg total) by mouth 3 (three) times daily. McDiarmid, Leighton Roach, MD Past Week Active Self  Insulin Glargine [facility] KWIKPEN) 100 UNIT/ML [id number]  Inject 20 Units into the skin daily. Evette Georges, MD  Active   insulin lispro (HUMALOG) 100 UNIT/ML injection [id number] No Inject 0.03-0.05 mLs (3-5 Units total) into the skin 2 (two) times daily with a meal. 3-4 units prior to breakfast, 4-5 units prior to evening meal  Patient taking differently: Inject 3-5 Units into the skin See admin instructions. 3-4 units prior to breakfast, 4-5 units prior to evening meal as needed for high blood sugar   Hensel, Santiago Bumpers, MD Past Week Active Self  metFORMIN (GLUCOPHAGE) 500 MG tablet [id number] No TAKE 2 TABLETS BY MOUTH TWICE DAILY WITH A MEAL  Patient taking differently: Take 1,000 mg by mouth 2 (two) times daily with a meal. TAKE 2 TABLETS BY MOUTH TWICE DAILY WITH A MEAL   McDiarmid, Leighton Roach, MD 10/03/2023 Active Self  sodium bicarbonate 650 MG tablet [id number] No Take 325 mg by mouth 2 (two) times daily. [provider] Past Week Active Self  tamsulosin (FLOMAX) 0.4 MG CAPS capsule [id number] No TAKE 1 CAPSULE  BY MOUTH DAILY McDiarmid, Leighton Roach, MD Past Week Active Self            Home Care and Equipment/Supplies: Were Home Health Services Ordered?: NA Any new equipment or medical supplies ordered?: NA  Functional Questionnaire: Do you need assistance with bathing/showering or dressing?: No Do you need assistance with meal preparation?: No Do you need assistance with  eating?: No Do you have difficulty maintaining continence: No Do you need assistance with getting out of bed/getting out of a chair/moving?: No Do you have difficulty managing or taking your medications?: No  Follow up appointments reviewed: PCP Follow-up appointment confirmed?: No MD Provider Line Number:[phone] Given: No Specialist Hospital Follow-up appointment confirmed?: NA Do you need transportation to your follow-up appointment?: No Do you understand care options if your condition(s) worsen?: Yes-patient verbalized understanding  SDOH Interventions Today    Flowsheet Row Most Recent Value  SDOH Interventions   Food Insecurity Interventions Intervention Not Indicated  Transportation Interventions Intervention Not Indicated      The patient declined enrollment in 30 day program  Deidre Ala, RN RN Care Manager [facility] [phone]

## 2023-10-23 ENCOUNTER — Other Ambulatory Visit: Payer: Self-pay | Admitting: Family Medicine

## 2023-10-23 DIAGNOSIS — E1165 Type 2 diabetes mellitus with hyperglycemia: Secondary | ICD-10-CM

## 2023-10-26 ENCOUNTER — Encounter: Payer: Self-pay | Admitting: Family Medicine

## 2023-10-26 ENCOUNTER — Ambulatory Visit (INDEPENDENT_AMBULATORY_CARE_PROVIDER_SITE_OTHER): Payer: Medicare HMO | Admitting: Family Medicine

## 2023-10-26 VITALS — BP 174/69 | HR 63 | Ht 71.0 in | Wt 146.5 lb

## 2023-10-26 DIAGNOSIS — E1151 Type 2 diabetes mellitus with diabetic peripheral angiopathy without gangrene: Secondary | ICD-10-CM

## 2023-10-26 DIAGNOSIS — E1159 Type 2 diabetes mellitus with other circulatory complications: Secondary | ICD-10-CM | POA: Diagnosis not present

## 2023-10-26 DIAGNOSIS — I152 Hypertension secondary to endocrine disorders: Secondary | ICD-10-CM | POA: Diagnosis not present

## 2023-10-26 NOTE — Patient Instructions (Signed)
Take Hydralazine 50 mg three times a day Take Carvedilol 25 mg Twice a day.  Continue Basaglar 10 units each morning. Make sure to take your fast acting insulin 3 units twice a day with a full meal.

## 2023-10-26 NOTE — Progress Notes (Deleted)
Colin Keller is {Pc accompanied by:5710} Sources of clinical information for visit is/are {Information source:60032}. Nursing assessment for this office visit was reviewed with the patient for accuracy and revision.     Previous Report(s) Reviewed: {Outside review:15817}     08/10/2023   10:58 AM  Depression screen PHQ 2/9  Decreased Interest 0  Down, Depressed, Hopeless 0  PHQ - 2 Score 0  Altered sleeping 3  Tired, decreased energy 2  Change in appetite 1  Feeling bad or failure about yourself  0  Trouble concentrating 0  Moving slowly or fidgety/restless 0  Suicidal thoughts 0  PHQ-9 Score 6   Flowsheet Row Office Visit from 08/10/2023 in [facility] Of [place]. [facility] Office Visit from 05/04/2023 in [facility] - [facility] Of [facility]. [facility] Office Visit from 04/13/2023 in [facility] - A Dept Of [facility]. [facility]  Thoughts that you would be better off dead, or of hurting yourself in some way Not at all Not at all Not at all  PHQ-9 Total Score 6 8 7           10/26/2023   10:55 AM 08/10/2023   10:58 AM 08/07/2023    9:29 AM 05/04/2023    8:29 AM 04/13/2023    8:55 AM  Fall Risk   Falls in the past year? 1 0 0 0 0  Number falls in past yr: 0 0 0 0 0  Injury with Fall? 1 0 0 0 0  Risk for fall due to :   No Fall Risks    Follow up   Falls prevention discussed;Education provided;Falls evaluation completed         08/10/2023   10:58 AM 08/07/2023    9:26 AM 05/04/2023    8:29 AM  PHQ9 SCORE ONLY  PHQ-9 Total Score 6 0 8    There are no preventive care reminders to display for this patient.  Health Maintenance Due  Topic Date Due   Zoster Vaccines- Shingrix (1 of 2) Never done   Colonoscopy  Never done   Lung Cancer Screening  Never done   OPHTHALMOLOGY EXAM  06/21/2018   COLON CANCER SCREENING ANNUAL FOBT  01/22/2019   Diabetic kidney evaluation - Urine ACR  06/17/2023    FOOT EXAM  06/17/2023   INFLUENZA VACCINE  07/13/2023      History/P.E. limitations: {exam; limitations ed:60112}  There are no preventive care reminders to display for this patient.  Diabetes Health Maintenance Due  Topic Date Due   OPHTHALMOLOGY EXAM  06/21/2018   FOOT EXAM  06/17/2023   HEMOGLOBIN A1C  04/03/2024    Health Maintenance Due  Topic Date Due   Zoster Vaccines- Shingrix (1 of 2) Never done   Colonoscopy  Never done   Lung Cancer Screening  Never done   OPHTHALMOLOGY EXAM  06/21/2018   COLON CANCER SCREENING ANNUAL FOBT  01/22/2019   Diabetic kidney evaluation - Urine ACR  06/17/2023   FOOT EXAM  06/17/2023   INFLUENZA VACCINE  07/13/2023     No chief complaint on file.    --------------------------------------------------------------------------------------------------------------------------------------------- Visit Problem List with A/P  No problem-specific Assessment & Plan notes found for this encounter.

## 2023-10-27 ENCOUNTER — Encounter: Payer: Self-pay | Admitting: Family Medicine

## 2023-10-27 ENCOUNTER — Telehealth: Payer: Self-pay

## 2023-10-27 ENCOUNTER — Telehealth: Payer: Self-pay | Admitting: Family Medicine

## 2023-10-27 DIAGNOSIS — M48062 Spinal stenosis, lumbar region with neurogenic claudication: Secondary | ICD-10-CM

## 2023-10-27 DIAGNOSIS — G8929 Other chronic pain: Secondary | ICD-10-CM

## 2023-10-27 DIAGNOSIS — M545 Low back pain, unspecified: Secondary | ICD-10-CM

## 2023-10-27 DIAGNOSIS — G629 Polyneuropathy, unspecified: Secondary | ICD-10-CM

## 2023-10-27 NOTE — Assessment & Plan Note (Signed)
Established problem Uncontrolled.  Patient is not at goal of SBP < 130. There seemed to be confusion about the dose of hydralazine Colin Keller is to be taking, and confusion about the hydralazine tablet dose he has been taking. Whatever tablet size he has, he has been taking half a tablet three times a day.  It is not certain if the tablet is 50 mg or 100 mg.   Plan Take Hydralazine 50 mg (either using half 100mg  tab, or one whole 50 mg tablet), Take Carvedilol 25 mg twice a day. Not restarting Jardiance because of concern it may have played a contributing role to his DKA event, though it was most likely his Covid infection.  RTC 4 weeks Discussed how Home BP readings would be helpful

## 2023-10-27 NOTE — Progress Notes (Signed)
Follow up outpatient visit after Hospitalization  Colin Keller is accompanied by wife Sources of clinical information for visit is/are patient and relative(s). The Discharge Summary for the hospitalization from 10/03/23 to 10/05/23 was reviewed.  Nursing assessment for this office visit was reviewed with the patient for accuracy and revision.   HPI Principle Diagnosis requiring hospitalization: DKA  Brief Hospital course summary:   Indication for Hospitalization: Cough, shortness of breath, weakness   Brief Hospital Course:  Colin Keller is a 67 y.o. male who was admitted to the [facility] Medicine Teaching Service at [facility] for nausea, vomiting, nonspecific chest pain. Hospital course is outlined below by problem.    Diabetic ketoacidosis without coma On presentation patient with stable vitals though with evidence of elevated glucose to 500s and acidosis with pH 7.1. He received IV fluids and IV insulin until his anion gap closed x 2, at which time he was able to take p.o. and transition to subcutaneous insulin.  His home insulin, Jardiance, and metformin were held over admission.  Metformin and home insulin were restarted by time of discharge.   COVID-19 Patient initially tested positive at home and also tested positive for COVID on arrival to the hospital.  He was outside of the window for Paxlovid and so this was not offered.  Chest x-ray stable.  He did not require oxygen throughout his hospitalization.  Follow up instructions from patient's hospital healthcare providers:  Consider starting Jardiance back; held at this time for concern of euglycemic DKA ---------------------------------------------------------------------------------------------------------------------- Problems since hospital discharge: none ---------------------------------------------------------------------------------------------------------------------- Follow up appointments with specialists:  none --------------------------------------------------------------------------------- New medications started during hospitalization: none Chronic medications stopped during hospitalization: Jardiance Patient's Medication List was updated in the EMR: yes   Visit Problem List with A/P  DM (diabetes mellitus), type 2 with peripheral vascular complications (HCC) Established problem Uncontrolled.   Lab Results  Component Value Date   HGBA1C 10.7 (H) 10/04/2023  Up from 9.1% Patient is not at goal of <8.0%. Patient taking Basaglar 10 unit in morning Taking humalog 3-5 units once or twice a day based on his CGM  Reviewed CGM report last 2 weeks  Patient spending about 50% in range Two episodes of capillary blood glucose down to 60s.  Recommendation Continue Basaglar 10 unit qam Take Humalog 3 units every meal Ensure eating a full meal when taking the Humalog RTC 4 weeks   Hypertension associated with diabetes (HCC) Established problem Uncontrolled.  Patient is not at goal of SBP < 130. There seemed to be confusion about the dose of hydralazine Mr Bomba is to be taking, and confusion about the hydralazine tablet dose he has been taking. Whatever tablet size he has, he has been taking half a tablet three times a day.  It is not certain if the tablet is 50 mg or 100 mg.   Plan Take Hydralazine 50 mg (either using half 100mg  tab, or one whole 50 mg tablet), Take Carvedilol 25 mg twice a day. Not restarting Jardiance because of concern it may have played a contributing role to his DKA event, though it was most likely his Covid infection.  RTC 4 weeks Discussed how Home BP readings would be helpful

## 2023-10-27 NOTE — Telephone Encounter (Signed)
Received call from patient's wife regarding rx refill request for hydrocodone-acetaminophen 10-325 mg.   She states that this was discussed at yesterday's appointment. She wants to make sure that rx is called in prior to the weekend.   Will forward to PCP.   If appropriate, please send rx refill to [facility].   Veronda Prude, RN

## 2023-10-27 NOTE — Assessment & Plan Note (Signed)
Established problem Uncontrolled.   Lab Results  Component Value Date   HGBA1C 10.7 (H) 10/04/2023  Up from 9.1% Patient is not at goal of <8.0%. Patient taking Basaglar 10 unit in morning Taking humalog 3-5 units once or twice a day based on his CGM  Reviewed CGM report last 2 weeks  Patient spending about 50% in range Two episodes of capillary blood glucose down to 60s.  Recommendation Continue Basaglar 10 unit qam Take Humalog 3 units every meal Ensure eating a full meal when taking the Humalog RTC 4 weeks

## 2023-10-27 NOTE — Telephone Encounter (Signed)
Patients wife Lawson Fiscal)  is calling checking on the refill for patients pain medicine. She stated doctor was suppose to have done it when they were here for appointment. Lawson Fiscal would like for someone to call her back when it is called in to let her know.   Please Advise.   Thanks!

## 2023-10-28 ENCOUNTER — Other Ambulatory Visit: Payer: Self-pay | Admitting: Family Medicine

## 2023-10-30 MED ORDER — HYDROCODONE-ACETAMINOPHEN 10-325 MG PO TABS: 1.0000 | ORAL_TABLET | Freq: Two times a day (BID) | ORAL | 0 refills | Status: DC | PRN

## 2023-10-30 NOTE — Telephone Encounter (Signed)
HC/APAP 10/325 tab, #60 sent to pharmacy

## 2023-10-30 NOTE — Telephone Encounter (Signed)
Left patient a voicemail informing him that his hydrocodone was sent to the pharmacy.

## 2023-10-30 NOTE — Telephone Encounter (Signed)
Please let patient's wife know the hydrocodone was sent to pharmacy

## 2023-11-13 DIAGNOSIS — E1169 Type 2 diabetes mellitus with other specified complication: Secondary | ICD-10-CM | POA: Diagnosis not present

## 2023-11-24 ENCOUNTER — Other Ambulatory Visit: Payer: Self-pay

## 2023-11-24 DIAGNOSIS — E1122 Type 2 diabetes mellitus with diabetic chronic kidney disease: Secondary | ICD-10-CM | POA: Diagnosis not present

## 2023-11-24 DIAGNOSIS — G8929 Other chronic pain: Secondary | ICD-10-CM

## 2023-11-24 DIAGNOSIS — N179 Acute kidney failure, unspecified: Secondary | ICD-10-CM | POA: Diagnosis not present

## 2023-11-24 DIAGNOSIS — G629 Polyneuropathy, unspecified: Secondary | ICD-10-CM

## 2023-11-24 DIAGNOSIS — N1832 Chronic kidney disease, stage 3b: Secondary | ICD-10-CM | POA: Diagnosis not present

## 2023-11-24 DIAGNOSIS — N2581 Secondary hyperparathyroidism of renal origin: Secondary | ICD-10-CM | POA: Diagnosis not present

## 2023-11-24 DIAGNOSIS — D631 Anemia in chronic kidney disease: Secondary | ICD-10-CM | POA: Diagnosis not present

## 2023-11-24 DIAGNOSIS — M48062 Spinal stenosis, lumbar region with neurogenic claudication: Secondary | ICD-10-CM

## 2023-11-24 DIAGNOSIS — E1129 Type 2 diabetes mellitus with other diabetic kidney complication: Secondary | ICD-10-CM | POA: Diagnosis not present

## 2023-11-24 DIAGNOSIS — N189 Chronic kidney disease, unspecified: Secondary | ICD-10-CM | POA: Diagnosis not present

## 2023-11-24 DIAGNOSIS — I129 Hypertensive chronic kidney disease with stage 1 through stage 4 chronic kidney disease, or unspecified chronic kidney disease: Secondary | ICD-10-CM | POA: Diagnosis not present

## 2023-11-24 DIAGNOSIS — R809 Proteinuria, unspecified: Secondary | ICD-10-CM | POA: Diagnosis not present

## 2023-11-24 LAB — IRON,TIBC AND FERRITIN PANEL
%SAT: 36
Iron: 73
TIBC: 204
UIBC: 131

## 2023-11-24 LAB — CBC AND DIFFERENTIAL
HCT: 31 — AB (ref 41–53)
Hemoglobin: 10.1 — AB (ref 13.5–17.5)
Platelets: 248 10*3/uL (ref 150–400)
WBC: 7.3

## 2023-11-24 LAB — CBC: RBC: 3.07 — AB (ref 3.87–5.11)

## 2023-11-24 NOTE — Telephone Encounter (Signed)
Patients wife calls nurse line requesting a refill on patients Hydrocodone.   She stated its due on 12/15, however the pharmacy will be closed on Sunday.   It appears prescription was last filled on 11/18.  I called the pharmacy and they reported last pick up date was 11/18.  Will forward to PCP.

## 2023-11-27 MED ORDER — HYDROCODONE-ACETAMINOPHEN 10-325 MG PO TABS: 1.0000 | ORAL_TABLET | Freq: Two times a day (BID) | ORAL | 0 refills | Status: DC | PRN

## 2023-11-30 ENCOUNTER — Ambulatory Visit (INDEPENDENT_AMBULATORY_CARE_PROVIDER_SITE_OTHER): Payer: Medicare HMO | Admitting: Family Medicine

## 2023-11-30 VITALS — BP 135/51 | HR 69 | Ht 71.0 in | Wt 151.5 lb

## 2023-11-30 DIAGNOSIS — E1151 Type 2 diabetes mellitus with diabetic peripheral angiopathy without gangrene: Secondary | ICD-10-CM

## 2023-11-30 DIAGNOSIS — I152 Hypertension secondary to endocrine disorders: Secondary | ICD-10-CM

## 2023-11-30 DIAGNOSIS — Z794 Long term (current) use of insulin: Secondary | ICD-10-CM | POA: Diagnosis not present

## 2023-11-30 DIAGNOSIS — I70219 Atherosclerosis of native arteries of extremities with intermittent claudication, unspecified extremity: Secondary | ICD-10-CM | POA: Diagnosis not present

## 2023-11-30 DIAGNOSIS — E1165 Type 2 diabetes mellitus with hyperglycemia: Secondary | ICD-10-CM

## 2023-11-30 DIAGNOSIS — I739 Peripheral vascular disease, unspecified: Secondary | ICD-10-CM

## 2023-11-30 NOTE — Patient Instructions (Addendum)
Taking the 14 units of your glargine daily Take 2-3 units three times a day with meals  Let Dr Keysha Damewood know which dose size hydralazine tablets you are taking.

## 2023-12-01 ENCOUNTER — Encounter: Payer: Self-pay | Admitting: Family Medicine

## 2023-12-01 NOTE — Progress Notes (Signed)
[name] is alone Sources of clinical information for visit is/are patient and CGM report. Nursing assessment for this office visit was reviewed with the patient for accuracy and revision.     Previous Report(s) Reviewed: CGM report downloaded last two weeks     11/30/2023   11:11 AM  Depression screen PHQ 2/9  Decreased Interest 0  Down, Depressed, Hopeless 1  PHQ - 2 Score 1  Altered sleeping 3  Tired, decreased energy 1  Change in appetite 1  Feeling bad or failure about yourself  0  Trouble concentrating 0  Moving slowly or fidgety/restless 1  Suicidal thoughts 0  PHQ-9 Score 7  Difficult doing work/chores Somewhat difficult   Flowsheet Row Office Visit from 11/30/2023 in [facility] - [facility] Of [place]. [facility] Office Visit from 08/10/2023 in [facility] - A Dept Of Eligha Bridegroom. [facility] Office Visit from 05/04/2023 in [facility] - A Dept Of Eligha [facility]idegroom. [facility]  Thoughts that you would be better off dead, or of hurting yourself in some way Not at all Not at all Not at all  PHQ-9 Total Score 7 6 8           11/30/2023   11:11 AM 10/26/2023   10:55 AM 08/10/2023   10:58 AM 08/07/2023    9:29 AM 05/04/2023    8:29 AM  Fall Risk   Falls in the past year? 0 1 0 0 0  Number falls in past yr: 0 0 0 0 0  Injury with Fall? 0 1 0 0 0  Risk for fall due to :    No Fall Risks   Follow up    Falls prevention discussed;Education provided;Falls evaluation completed        11/30/2023   11:11 AM 08/10/2023   10:58 AM 08/07/2023    9:26 AM  PHQ9 SCORE ONLY  PHQ-9 Total Score 7 6 0    There are no preventive care reminders to display for this patient.  Health Maintenance Due  Topic Date Due   Zoster Vaccines- Shingrix (1 of 2) Never done   Colonoscopy  Never done   Lung Cancer Screening  Never done   OPHTHALMOLOGY EXAM  06/21/2018   COLON CANCER SCREENING ANNUAL FOBT  01/22/2019   Diabetic  kidney evaluation - Urine ACR  06/17/2023   FOOT EXAM  06/17/2023      History/P.E. limitations: none  There are no preventive care reminders to display for this patient.  Diabetes Health Maintenance Due  Topic Date Due   OPHTHALMOLOGY EXAM  06/21/2018   FOOT EXAM  06/17/2023   HEMOGLOBIN A1C  04/03/2024    Health Maintenance Due  Topic Date Due   Zoster Vaccines- Shingrix (1 of 2) Never done   Colonoscopy  Never done   Lung Cancer Screening  Never done   OPHTHALMOLOGY EXAM  06/21/2018   COLON CANCER SCREENING ANNUAL FOBT  01/22/2019   Diabetic kidney evaluation - Urine ACR  06/17/2023   FOOT EXAM  06/17/2023     Chief Complaint  Patient presents with   Medical Management of Chronic Issues     --------------------------------------------------------------------------------------------------------------------------------------------- Visit Problem List with A/P  No problem-specific Assessment & Plan notes found for this encounter.

## 2023-12-01 NOTE — Assessment & Plan Note (Signed)
Established problem that has improved and has meet goal of SBP <130.  It is uncertain if the whole tablet that Colin Keller has been taking a half of three times a day is a 100 mg or 50 mg tablet.  Colin Keller will let me know via MyChart. Continue current medication regiment.

## 2023-12-01 NOTE — Assessment & Plan Note (Signed)
Visit conducted in coordination & discussion with Dr Raymondo Band, Ilda Basset D. CGM report last two weeks predicts similar A1c 10.7 two months ago. Mr Colin Keller has had some afternoon lows of capillary blood glucose into 50s which he felt and was able to self rescue.  He believes he may have not eaten a snack.  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 No further SGLT2i therapy for patient.   Plan is for [name] is to take 14 units of Lantus daily with 2-3 units of humalog with each meal three times a day. Uncertain metformin is helping much given undetectable C-peptide level, but will continue until patient has better glycemic control or eGFR is contraindication.

## 2023-12-25 ENCOUNTER — Other Ambulatory Visit: Payer: Self-pay

## 2023-12-25 ENCOUNTER — Other Ambulatory Visit: Payer: Self-pay | Admitting: Family Medicine

## 2023-12-25 DIAGNOSIS — M48062 Spinal stenosis, lumbar region with neurogenic claudication: Secondary | ICD-10-CM

## 2023-12-25 DIAGNOSIS — G8929 Other chronic pain: Secondary | ICD-10-CM

## 2023-12-25 DIAGNOSIS — G629 Polyneuropathy, unspecified: Secondary | ICD-10-CM

## 2023-12-25 DIAGNOSIS — M545 Low back pain, unspecified: Secondary | ICD-10-CM

## 2023-12-25 MED ORDER — HYDROCODONE-ACETAMINOPHEN 10-325 MG PO TABS: 1.0000 | ORAL_TABLET | Freq: Two times a day (BID) | ORAL | 0 refills | Status: DC | PRN

## 2024-01-02 ENCOUNTER — Encounter: Payer: Self-pay | Admitting: Family Medicine

## 2024-01-02 LAB — PROTEIN / CREATININE RATIO, URINE
C-Peptide: <0.1
Creat: 1.28
Potassium, Bld: 5.4
Protein Creatinine Ratio: 5521

## 2024-01-02 LAB — PTH, INTACT: PTH: 46

## 2024-01-02 LAB — DISCHARGE PATIENT: Ferritin: 162

## 2024-01-09 DIAGNOSIS — E113593 Type 2 diabetes mellitus with proliferative diabetic retinopathy without macular edema, bilateral: Secondary | ICD-10-CM | POA: Diagnosis not present

## 2024-01-13 DIAGNOSIS — H5203 Hypermetropia, bilateral: Secondary | ICD-10-CM | POA: Diagnosis not present

## 2024-01-15 ENCOUNTER — Other Ambulatory Visit: Payer: Self-pay | Admitting: *Deleted

## 2024-01-15 ENCOUNTER — Telehealth: Payer: Self-pay

## 2024-01-15 DIAGNOSIS — I6523 Occlusion and stenosis of bilateral carotid arteries: Secondary | ICD-10-CM

## 2024-01-15 NOTE — Telephone Encounter (Signed)
Patient on nurse line in regards to [employer].   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   He reports he received a letter stating his enrollment is expiring.   He reports he needs help with the re enrollment process.   Advised will forward to pharmacy for assistance.

## 2024-01-15 NOTE — Telephone Encounter (Signed)
Reached out via mychart in 11/2023.  Will mail application to patients home.

## 2024-01-16 ENCOUNTER — Telehealth: Payer: Self-pay

## 2024-01-16 DIAGNOSIS — E1151 Type 2 diabetes mellitus with diabetic peripheral angiopathy without gangrene: Secondary | ICD-10-CM

## 2024-01-16 DIAGNOSIS — E1165 Type 2 diabetes mellitus with hyperglycemia: Secondary | ICD-10-CM

## 2024-01-16 DIAGNOSIS — E11319 Type 2 diabetes mellitus with unspecified diabetic retinopathy without macular edema: Secondary | ICD-10-CM

## 2024-01-17 NOTE — Progress Notes (Addendum)
 Pharmacy Medication Assistance Program Note    02/08/2024  Patient ID: Colin Keller, male   DOB: 20-Mar-1956, 68 y.o.   MRN: [id number]     01/16/2024  Outreach Medication One  Manufacturer Medication One [employer] Basaglar   Type of Assistance Manufacturer Assistance  Date Application Sent to Patient 01/17/2024  Application Items Requested Application  Date Application Received From Patient 01/30/2024  Date Application Received From Provider 02/06/2024  Date Application Submitted to Manufacturer 02/06/2024  Method Application Sent to Manufacturer Fax  Patient Assistance Determination Approved  Approval Start Date 02/07/2024  Approval End Date 12/11/2024        02/08/2024  Patient ID: Colin Keller, male  DOB: Mar 28, 1956, 68 y.o.  MRN:  [id number]     01/16/2024  Outreach Medication Two  Manufacturer Medication Two Retail Buy[employer] Drugs Humalog   Type of Assistance Manufacturer Assistance  Date Application Sent to Patient 01/17/2024  Application Items Requested Application  Date Application Received From Patient 01/30/2024  Date Application Received From Provider 02/06/2024  Method Application Sent to Manufacturer Fax  Date Application Submitted to Manufacturer 02/06/2024  Patient Assistance Determination Approved  Approval Start Date 02/07/2024

## 2024-01-22 ENCOUNTER — Other Ambulatory Visit: Payer: Self-pay | Admitting: Family Medicine

## 2024-01-22 DIAGNOSIS — M545 Low back pain, unspecified: Secondary | ICD-10-CM

## 2024-01-22 DIAGNOSIS — G8929 Other chronic pain: Secondary | ICD-10-CM

## 2024-01-22 DIAGNOSIS — M48062 Spinal stenosis, lumbar region with neurogenic claudication: Secondary | ICD-10-CM

## 2024-01-22 DIAGNOSIS — G629 Polyneuropathy, unspecified: Secondary | ICD-10-CM

## 2024-02-02 DIAGNOSIS — J3489 Other specified disorders of nose and nasal sinuses: Secondary | ICD-10-CM | POA: Diagnosis not present

## 2024-02-02 DIAGNOSIS — M791 Myalgia, unspecified site: Secondary | ICD-10-CM | POA: Diagnosis not present

## 2024-02-02 DIAGNOSIS — R0981 Nasal congestion: Secondary | ICD-10-CM | POA: Diagnosis not present

## 2024-02-02 DIAGNOSIS — R059 Cough, unspecified: Secondary | ICD-10-CM | POA: Diagnosis not present

## 2024-02-02 DIAGNOSIS — R509 Fever, unspecified: Secondary | ICD-10-CM | POA: Diagnosis not present

## 2024-02-06 ENCOUNTER — Telehealth: Payer: Self-pay

## 2024-02-06 NOTE — Telephone Encounter (Signed)
 Received VM from patient's wife, Lawson Fiscal, regarding recent urgent care visit. She reports that patient was seen on 2/22 at [facility] urgent care and was diagnosed with walking pneumonia. Patient was started on antibiotic and cough medication.   She just wanted to make PCP aware. If there are any questions, wife can be reached at [phone].  Veronda Prude, RN

## 2024-02-06 NOTE — Telephone Encounter (Signed)
 Please send new 90 day RX's to [facility] specialty pharmacy for patients PAP renewal (humalog and basaglar). Thanks!

## 2024-02-07 ENCOUNTER — Other Ambulatory Visit: Payer: Self-pay | Admitting: Cardiology

## 2024-02-07 DIAGNOSIS — I10 Essential (primary) hypertension: Secondary | ICD-10-CM

## 2024-02-07 MED ORDER — BASAGLAR KWIKPEN 100 UNIT/ML ~~LOC~~ SOPN: 20.0000 [IU] | PEN_INJECTOR | Freq: Every day | SUBCUTANEOUS | 3 refills | Status: DC

## 2024-02-07 MED ORDER — INSULIN LISPRO 100 UNIT/ML IJ SOLN: 3.0000 [IU] | Freq: Three times a day (TID) | INTRAMUSCULAR | 3 refills | Status: DC

## 2024-02-07 NOTE — Telephone Encounter (Signed)
 Humalog and Basaglar 90 day supplies with RF x 3 sent to [facility] specialty pharmacy

## 2024-02-15 ENCOUNTER — Ambulatory Visit (HOSPITAL_COMMUNITY): Admission: RE | Admit: 2024-02-15 | Payer: Medicare HMO | Source: Ambulatory Visit

## 2024-02-16 ENCOUNTER — Telehealth: Payer: Self-pay | Admitting: *Deleted

## 2024-02-16 NOTE — Telephone Encounter (Signed)
 Patient was identified as falling into the [place] Measure - Diabetes.   Patient was: Appointment scheduled with primary care provider in the next 30 days.

## 2024-02-19 ENCOUNTER — Other Ambulatory Visit: Payer: Self-pay

## 2024-02-19 DIAGNOSIS — G629 Polyneuropathy, unspecified: Secondary | ICD-10-CM

## 2024-02-19 DIAGNOSIS — M545 Low back pain, unspecified: Secondary | ICD-10-CM

## 2024-02-19 DIAGNOSIS — M48062 Spinal stenosis, lumbar region with neurogenic claudication: Secondary | ICD-10-CM

## 2024-02-19 DIAGNOSIS — G8929 Other chronic pain: Secondary | ICD-10-CM

## 2024-02-19 MED ORDER — HYDROCODONE-ACETAMINOPHEN 10-325 MG PO TABS: 1.0000 | ORAL_TABLET | Freq: Two times a day (BID) | ORAL | 0 refills | Status: DC | PRN

## 2024-02-20 ENCOUNTER — Ambulatory Visit (INDEPENDENT_AMBULATORY_CARE_PROVIDER_SITE_OTHER): Payer: Medicare HMO | Admitting: Family Medicine

## 2024-02-20 ENCOUNTER — Encounter: Payer: Self-pay | Admitting: Family Medicine

## 2024-02-20 VITALS — BP 155/65 | HR 63 | Ht 71.0 in | Wt 146.1 lb

## 2024-02-20 DIAGNOSIS — Z794 Long term (current) use of insulin: Secondary | ICD-10-CM

## 2024-02-20 DIAGNOSIS — I509 Heart failure, unspecified: Secondary | ICD-10-CM | POA: Diagnosis not present

## 2024-02-20 DIAGNOSIS — I152 Hypertension secondary to endocrine disorders: Secondary | ICD-10-CM

## 2024-02-20 DIAGNOSIS — E1159 Type 2 diabetes mellitus with other circulatory complications: Secondary | ICD-10-CM

## 2024-02-20 DIAGNOSIS — M48061 Spinal stenosis, lumbar region without neurogenic claudication: Secondary | ICD-10-CM

## 2024-02-20 DIAGNOSIS — N401 Enlarged prostate with lower urinary tract symptoms: Secondary | ICD-10-CM | POA: Diagnosis not present

## 2024-02-20 DIAGNOSIS — G720 Drug-induced myopathy: Secondary | ICD-10-CM | POA: Diagnosis not present

## 2024-02-20 DIAGNOSIS — E1151 Type 2 diabetes mellitus with diabetic peripheral angiopathy without gangrene: Secondary | ICD-10-CM

## 2024-02-20 DIAGNOSIS — E1165 Type 2 diabetes mellitus with hyperglycemia: Secondary | ICD-10-CM | POA: Diagnosis not present

## 2024-02-20 DIAGNOSIS — T466X5A Adverse effect of antihyperlipidemic and antiarteriosclerotic drugs, initial encounter: Secondary | ICD-10-CM

## 2024-02-20 DIAGNOSIS — F17210 Nicotine dependence, cigarettes, uncomplicated: Secondary | ICD-10-CM

## 2024-02-20 LAB — POCT GLYCOSYLATED HEMOGLOBIN (HGB A1C): HbA1c, POC (controlled diabetic range): 10.1 % — AB (ref 0.0–7.0)

## 2024-02-20 MED ORDER — TAMSULOSIN HCL 0.4 MG PO CAPS: 0.4000 mg | ORAL_CAPSULE | Freq: Every day | ORAL | 3 refills | Status: DC

## 2024-02-20 MED ORDER — BASAGLAR KWIKPEN 100 UNIT/ML ~~LOC~~ SOPN: 12.0000 [IU] | PEN_INJECTOR | Freq: Every day | SUBCUTANEOUS | Status: DC

## 2024-02-20 MED ORDER — DILTIAZEM HCL ER COATED BEADS 120 MG PO CP24
120.0000 mg | ORAL_CAPSULE | Freq: Every day | ORAL | 1 refills | Status: DC
Start: 2024-02-20 — End: 2024-04-17

## 2024-02-20 NOTE — Patient Instructions (Signed)
 Continue your insulin regiment as you are doing.  Start a new blood pressure Diltiazem 120 mg daily.  Take one tablet a day.   A referral was made for a Lung CAT scan to screen for lung cancer.  It is covered by your insurance 100%.

## 2024-02-20 NOTE — Progress Notes (Unsigned)
 Colin Keller is alone Sources of clinical information for visit is/are {Information source:60032}. Nursing assessment for this office visit was reviewed with the patient for accuracy and revision.     Previous Report(s) Reviewed: {Outside review:15817}     11/30/2023   11:11 AM  Depression screen PHQ 2/9  Decreased Interest 0  Down, Depressed, Hopeless 1  PHQ - 2 Score 1  Altered sleeping 3  Tired, decreased energy 1  Change in appetite 1  Feeling bad or failure about yourself  0  Trouble concentrating 0  Moving slowly or fidgety/restless 1  Suicidal thoughts 0  PHQ-9 Score 7  Difficult doing work/chores Somewhat difficult   Flowsheet Row Office Visit from 11/30/2023 in [facility] Of [place]. [facility] Office Visit from 08/10/2023 in [facility] - A Dept Of Eligha Bridegroom. [facility] Office Visit from 05/04/2023 in [facility] - A Dept Of Eligha Bridegroom. [facility]  Thoughts that you would be better off dead, or of hurting yourself in some way Not at all Not at all Not at all  PHQ-9 Total Score 7 6 8           11/30/2023   11:11 AM 10/26/2023   10:55 AM 08/10/2023   10:58 AM 08/07/2023    9:29 AM 05/04/2023    8:29 AM  Fall Risk   Falls in the past year? 0 1 0 0 0  Number falls in past yr: 0 0 0 0 0  Injury with Fall? 0 1 0 0 0  Risk for fall due to :    No Fall Risks   Follow up    Falls prevention discussed;Education provided;Falls evaluation completed        11/30/2023   11:11 AM 08/10/2023   10:58 AM 08/07/2023    9:26 AM  PHQ9 SCORE ONLY  PHQ-9 Total Score 7 6 0    There are no preventive care reminders to display for this patient.  Health Maintenance Due  Topic Date Due   Zoster Vaccines- Shingrix (1 of 2) Never done   Colonoscopy  Never done   Lung Cancer Screening  Never done   OPHTHALMOLOGY EXAM  06/21/2018   COLON CANCER SCREENING ANNUAL FOBT  01/22/2019   FOOT EXAM   06/17/2023   COVID-19 Vaccine (4 - 2024-25 season) 08/13/2023      History/P.E. limitations: {exam; limitations ed:60112}  There are no preventive care reminders to display for this patient.  Diabetes Health Maintenance Due  Topic Date Due   OPHTHALMOLOGY EXAM  06/21/2018   FOOT EXAM  06/17/2023   HEMOGLOBIN A1C  04/03/2024    Health Maintenance Due  Topic Date Due   Zoster Vaccines- Shingrix (1 of 2) Never done   Colonoscopy  Never done   Lung Cancer Screening  Never done   OPHTHALMOLOGY EXAM  06/21/2018   COLON CANCER SCREENING ANNUAL FOBT  01/22/2019   FOOT EXAM  06/17/2023   COVID-19 Vaccine (4 - 2024-25 season) 08/13/2023     No chief complaint on file.    --------------------------------------------------------------------------------------------------------------------------------------------- Visit Problem List with A/P  No problem-specific Assessment & Plan notes found for this encounter.

## 2024-02-21 ENCOUNTER — Encounter: Payer: Self-pay | Admitting: Family Medicine

## 2024-02-21 MED ORDER — BASAGLAR KWIKPEN 100 UNIT/ML ~~LOC~~ SOPN: 10.0000 [IU] | PEN_INJECTOR | Freq: Every day | SUBCUTANEOUS | Status: DC

## 2024-02-21 NOTE — Assessment & Plan Note (Signed)
Patient intolerant of several statins in past  He is tolerating Repatha from the [facility] with good LDL reduction

## 2024-02-21 NOTE — Assessment & Plan Note (Signed)
 Established problem Adequate analgesia. No adverse effects. Able to complete advanced ADLs with use of Norco 10/325 every 12 hours prn. No aberrant behaviors - PDMP checked.   Continue current therapy regiment.

## 2024-02-21 NOTE — Assessment & Plan Note (Addendum)
 Lab Results  Component Value Date   HGBA1C 10.1 (A) 02/20/2024  A1c 10.7% (10/04/23) Established problem that has improved and has not meet goal of <7.5%.  CGM report 02/06/23-02/20/24 (libre 2): In target range > 70% of time. < 70 4% of time, > 180 25% of time, >250 5% of time. Average glucose 220 with 46% variabiilty.   Continue insulin and metformin medication regiment.

## 2024-02-21 NOTE — Assessment & Plan Note (Signed)
 Established problem that has improved and has not meet goal of <140.   Continue Hydralazine 50 mg three times a day, Carvedilol 25 mg twice a day Start Diltiazem 120 mg 24h cap, one cap daily RTC 6 weeks for recheck.  Could add thiazide given improvement in serum creatinine.

## 2024-02-23 ENCOUNTER — Other Ambulatory Visit: Payer: Self-pay

## 2024-02-23 DIAGNOSIS — E11319 Type 2 diabetes mellitus with unspecified diabetic retinopathy without macular edema: Secondary | ICD-10-CM

## 2024-02-23 DIAGNOSIS — E1151 Type 2 diabetes mellitus with diabetic peripheral angiopathy without gangrene: Secondary | ICD-10-CM

## 2024-02-26 MED ORDER — INSULIN LISPRO 100 UNIT/ML IJ SOLN
3.0000 [IU] | Freq: Three times a day (TID) | INTRAMUSCULAR | 3 refills | Status: DC
Start: 2024-02-26 — End: 2024-03-19

## 2024-03-01 DIAGNOSIS — R809 Proteinuria, unspecified: Secondary | ICD-10-CM | POA: Diagnosis not present

## 2024-03-01 DIAGNOSIS — I129 Hypertensive chronic kidney disease with stage 1 through stage 4 chronic kidney disease, or unspecified chronic kidney disease: Secondary | ICD-10-CM | POA: Diagnosis not present

## 2024-03-01 DIAGNOSIS — E1122 Type 2 diabetes mellitus with diabetic chronic kidney disease: Secondary | ICD-10-CM | POA: Diagnosis not present

## 2024-03-01 DIAGNOSIS — E1129 Type 2 diabetes mellitus with other diabetic kidney complication: Secondary | ICD-10-CM | POA: Diagnosis not present

## 2024-03-01 DIAGNOSIS — N2581 Secondary hyperparathyroidism of renal origin: Secondary | ICD-10-CM | POA: Diagnosis not present

## 2024-03-01 DIAGNOSIS — N189 Chronic kidney disease, unspecified: Secondary | ICD-10-CM | POA: Diagnosis not present

## 2024-03-01 DIAGNOSIS — D631 Anemia in chronic kidney disease: Secondary | ICD-10-CM | POA: Diagnosis not present

## 2024-03-01 DIAGNOSIS — N179 Acute kidney failure, unspecified: Secondary | ICD-10-CM | POA: Diagnosis not present

## 2024-03-01 DIAGNOSIS — N1832 Chronic kidney disease, stage 3b: Secondary | ICD-10-CM | POA: Diagnosis not present

## 2024-03-04 ENCOUNTER — Encounter: Payer: Self-pay | Admitting: Cardiology

## 2024-03-04 ENCOUNTER — Ambulatory Visit (HOSPITAL_COMMUNITY)
Admission: RE | Admit: 2024-03-04 | Discharge: 2024-03-04 | Disposition: A | Discharge status: Home / Self Care | Source: Ambulatory Visit | Attending: Cardiology | Admitting: Cardiology

## 2024-03-04 DIAGNOSIS — I6523 Occlusion and stenosis of bilateral carotid arteries: Secondary | ICD-10-CM | POA: Diagnosis not present

## 2024-03-04 DIAGNOSIS — E785 Hyperlipidemia, unspecified: Secondary | ICD-10-CM

## 2024-03-04 DIAGNOSIS — I2510 Atherosclerotic heart disease of native coronary artery without angina pectoris: Secondary | ICD-10-CM

## 2024-03-04 DIAGNOSIS — I739 Peripheral vascular disease, unspecified: Secondary | ICD-10-CM

## 2024-03-04 DIAGNOSIS — E1169 Type 2 diabetes mellitus with other specified complication: Secondary | ICD-10-CM

## 2024-03-05 ENCOUNTER — Encounter: Payer: Self-pay | Admitting: *Deleted

## 2024-03-11 ENCOUNTER — Encounter: Payer: Self-pay | Admitting: *Deleted

## 2024-03-11 ENCOUNTER — Other Ambulatory Visit: Payer: Self-pay | Admitting: *Deleted

## 2024-03-11 DIAGNOSIS — I6523 Occlusion and stenosis of bilateral carotid arteries: Secondary | ICD-10-CM

## 2024-03-12 ENCOUNTER — Other Ambulatory Visit (HOSPITAL_COMMUNITY): Payer: Self-pay

## 2024-03-12 ENCOUNTER — Telehealth: Payer: Self-pay | Admitting: Pharmacy Technician

## 2024-03-12 ENCOUNTER — Encounter: Payer: Self-pay | Admitting: Pharmacy Technician

## 2024-03-12 MED ORDER — REPATHA SURECLICK 140 MG/ML ~~LOC~~ SOAJ: 1.0000 mL | SUBCUTANEOUS | 3 refills | Status: DC

## 2024-03-12 NOTE — Telephone Encounter (Signed)
 Patient Advocate Encounter   The patient was approved for a [employer] grant that will help cover the cost of repatha Total amount awarded, 2500.  Effective: 02/11/2024 - 02/09/25   [id number] [id number] [employer] ID: [id number]   Pharmacy provided with approval and processing information. Patient informed via mychart    However, per the pharmacy they do not have an active prescription for the repatha. Sending a message to staff.

## 2024-03-18 ENCOUNTER — Other Ambulatory Visit: Payer: Self-pay

## 2024-03-18 DIAGNOSIS — M48062 Spinal stenosis, lumbar region with neurogenic claudication: Secondary | ICD-10-CM

## 2024-03-18 DIAGNOSIS — G629 Polyneuropathy, unspecified: Secondary | ICD-10-CM

## 2024-03-18 DIAGNOSIS — G8929 Other chronic pain: Secondary | ICD-10-CM

## 2024-03-18 MED ORDER — HYDROCODONE-ACETAMINOPHEN 10-325 MG PO TABS: 1.0000 | ORAL_TABLET | Freq: Two times a day (BID) | ORAL | 0 refills | Status: DC | PRN

## 2024-03-19 ENCOUNTER — Ambulatory Visit: Payer: Self-pay | Attending: Cardiovascular Disease | Admitting: Cardiovascular Disease

## 2024-03-19 ENCOUNTER — Other Ambulatory Visit: Payer: Self-pay

## 2024-03-19 ENCOUNTER — Encounter: Payer: Self-pay | Admitting: Cardiovascular Disease

## 2024-03-19 VITALS — BP 150/70 | HR 73 | Ht 71.0 in | Wt 148.2 lb

## 2024-03-19 DIAGNOSIS — I6523 Occlusion and stenosis of bilateral carotid arteries: Secondary | ICD-10-CM

## 2024-03-19 DIAGNOSIS — Z720 Tobacco use: Secondary | ICD-10-CM

## 2024-03-19 DIAGNOSIS — I10 Essential (primary) hypertension: Secondary | ICD-10-CM

## 2024-03-19 DIAGNOSIS — I739 Peripheral vascular disease, unspecified: Secondary | ICD-10-CM | POA: Diagnosis not present

## 2024-03-19 DIAGNOSIS — E785 Hyperlipidemia, unspecified: Secondary | ICD-10-CM | POA: Diagnosis not present

## 2024-03-19 DIAGNOSIS — E1151 Type 2 diabetes mellitus with diabetic peripheral angiopathy without gangrene: Secondary | ICD-10-CM

## 2024-03-19 DIAGNOSIS — E11319 Type 2 diabetes mellitus with unspecified diabetic retinopathy without macular edema: Secondary | ICD-10-CM

## 2024-03-19 MED ORDER — INSULIN LISPRO 100 UNIT/ML IJ SOLN: 3.0000 [IU] | Freq: Three times a day (TID) | INTRAMUSCULAR | 3 refills | Status: DC

## 2024-03-19 NOTE — Progress Notes (Signed)
 Cardiology Office Note   Date:  03/19/2024   ID:  Colin Keller, DOB 1956-09-07, MRN [id number]  PCP:  McDiarmid, Leighton Roach, MD  Cardiologist: Dr. Jens Som  No chief complaint on file.      History of Present Illness: Colin Keller is a 68 y.o. male who is here today for follow-up visit regarding peripheral arterial disease.  He has known history of coronary artery disease with previous non-ST elevation myocardial infarction in 2019.  Cardiac catheterization showed severe three-vessel coronary artery disease and he was treated with CABG.  He has chronic medical conditions include hyperlipidemia with intolerance to statins, chronic kidney disease, type 2 diabetes and carotid artery disease. Previous screening for abdominal aortic aneurysm in 2020 was negative.  Moderately elevated velocities were noted in the iliac arteries. He has been followed for peripheral arterial disease with bilateral SFA and popliteal disease.  He had worsening symptoms in 2023 with progression to rest pain on the left side.  Repeat Doppler studies showed a drop in ABI to 0.39 on the left. Angiography in October 2023 showed no significant aortoiliac disease.  On the left side, there was severely calcified disease affecting the proximal as well as distal SFA with three-vessel runoff below the knee.  There was short occlusion of the distal SFA.  I performed successful orbital atherectomy and drug-coated balloon angioplasty to the left SFA with excellent results.  He was hospitalized in October with hyperglycemia and ketoacidosis in the setting of COVID-19 infection.  Jardiance was discontinued.  He had acute on chronic renal disease that improved with hydration.  He was found to have mildly elevated troponin which was felt to be due to supply demand ischemia. He reports stable bilateral calf claudication worse on the right side than the left side.  He has no rest pain or lower extremity ulceration.  He is trying to quit  smoking and currently smokes 5 to 6 cigarettes a day.    Past Medical History:  Diagnosis Date   Acute diastolic heart failure (HCC) 08/18/2018   AKI (acute kidney injury) (HCC) 10/04/2023   Atherosclerosis of both carotid arteries 08/18/2020   Carotid dopplers 08/2019 40-59% bilateral stenosis and right subclavian stenosis   Bilateral carotid artery stenosis 08/18/2020   Right Carotid: Velocities in the right ICA are consistent with a 60-79%                 stenosis. Non-hemodynamically significant plaque <50% noted in                 the CCA. The ECA appears >50% stenosed. Proximal subclavian                 artery dilatation with elevated and turbulent flow just past the                 narrowing, measuring 1.6 cm.   Left Carotid: Velocities in the left ICA are    CHF (congestive heart failure) (HCC)    Chronic congestive heart failure (HCC) 10/04/2023   Transthoracic echocardiogram 10/08/2018: LVEF is approximately 50% with hypokinesis of the lateral (base/mid) and basal inferior walls T     Chronic left shoulder pain 03/18/2021   Chronic low back pain without sciatica 10/16/2008   H/o chronic LBP with h/o lumbar disc herniation and intermittent radicular pain  Chronic pain, on stable doses of Norco 10/325 TID. MRI in 2003 showing SMALL CENTRAL DISC HERNIATION AT L4-5.  DIFFUSE DISC BULGE AT  L3-4 WITH SMALL ANNULAR TEAR POSTERIORLY.      CKD (chronic kidney disease), stage III (HCC)    Coronary artery disease    Coronary artery disease involving native heart with angina pectoris, unspecified vessel or lesion type (HCC) 08/10/2023   COVID 10/04/2023   Diabetes mellitus    DKA (diabetic ketoacidosis) (HCC) 10/03/2023   Dyspnea    Fall 10/04/2023   GERD (gastroesophageal reflux disease)    Hx of CABG 08/23/2018   LIMA to LAD, RIMA to RCA, SVG to OM3, EVH via right thigh   Hyperlipidemia    Hypertension    Hypertension associated with diabetes (HCC) 12/22/2008   Qualifier: Diagnosis of   By: Lafonda Mosses MD, Susan     Hypertensive nephropathy 08/18/2020   Insulin dependent type 2 diabetes mellitus (HCC) 12/22/2020   Left ventricular dysfunction 08/18/2020   TTE ([id number]): LVEF is approximately 50% with hypokinesis of the lateral (base/mid) and basal inferior walls The cavity size was normal.      Long term (current) use of antithrombotics/antiplatelets 09/21/2022   Planned 18-months DAPT for DES for PAD on 09/21/2022.  Planned 6 months therapy. End 03/23/23.   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   LIMA to LAD, RIMA to RCA, SVG to OM3, [facility] via right thigh   Sleeping difficulties 09/08/2019   Subclavian artery stenosis, right (HCC) 08/18/2020   Carotid dopplers 08/2019 40-59% bilateral stenosis and right subclavian stenosis   Tobacco abuse    Tobacco abuse disorder    Qualifier: Diagnosis of  By: Lafonda Mosses MD, Darl Pikes      Past Surgical History:  Procedure Laterality Date   ABDOMINAL AORTOGRAM W/LOWER EXTREMITY N/A 09/21/2022   Procedure: ABDOMINAL AORTOGRAM W/LOWER EXTREMITY;  Surgeon: Iran Ouch, MD;  Location: [facility] INVASIVE CV LAB;  Service: Cardiovascular;  Laterality: N/A;   CORONARY ARTERY BYPASS GRAFT N/A 08/23/2018   Procedure: CORONARY ARTERY BYPASS GRAFTING (CABG) x 3; -Left Internal Mammary Artery to Left Anterior Descending Artery, -Right Internal Mammary Artery to Right Coronary Artery, -Saphenous Vein Graft to Obtuse Marginal;  ENDOSCOPIC HARVEST GREATER SAPHENOUS VEIN  -Right Thigh;  Surgeon: Purcell Nails,  MD;  Location[facility] OR;  Service: Open Heart Surgery;  Laterality: N/A;   CORONARY PRESSURE/FFR STUDY N/A 08/20/2018   Procedure: INTRAVASCULAR PRESSURE WIRE/FFR STUDY;  Surgeon: Tonny Bollman, MD;  Location: [facility] INVASIVE CV LAB;  Service: Cardiovascular;  Laterality: N/A;   LEFT HEART CATH AND CORONARY ANGIOGRAPHY N/A 08/20/2018   Procedure: LEFT HEART CATH AND CORONARY ANGIOGRAPHY;  Surgeon: Tonny Bollman, MD;  Location: [facility] INVASIVE CV LAB;  Service: Cardiovascular;  Laterality: N/A;   PERIPHERAL VASCULAR ATHERECTOMY Left 09/21/2022   Procedure: PERIPHERAL VASCULAR ATHERECTOMY;  Surgeon: Iran Ouch, MD;  Location: [facility] INVASIVE CV LAB;  Service: Cardiovascular;  Laterality: Left;  SFA   PERIPHERAL VASCULAR BALLOON ANGIOPLASTY Left 09/21/2022   Procedure: PERIPHERAL VASCULAR BALLOON ANGIOPLASTY;  Surgeon: Iran Ouch, MD;  Location: [facility] INVASIVE CV LAB;  Service: Cardiovascular;  Laterality: Left;  SFA   TEE WITHOUT CARDIOVERSION N/A 08/23/2018   Procedure: TRANSESOPHAGEAL ECHOCARDIOGRAM (TEE);  Surgeon: Purcell Nails, MD;  Location: [facility] OR;  Service: Open Heart Surgery;  Laterality: N/A;     Current Outpatient Medications  Medication Sig Dispense Refill   acetaminophen (TYLENOL) 500 MG tablet Take 1,000 mg by mouth once as needed for moderate pain (pain score 4-6).     aspirin 81 MG tablet Take 81 mg by mouth daily.     carvedilol (COREG) 25 MG tablet Take 1 tablet (25 mg total) by mouth 2 (two) times daily with a meal. 180 tablet 3   Continuous Blood Gluc Sensor (FREESTYLE LIBRE 2 SENSOR) MISC 1 Device by Does not apply route every 14 (fourteen) days.     Cyanocobalamin (VITAMIN B 12 PO) Take 1 tablet by mouth daily.     diltiazem (CARDIZEM CD) 120 MG 24 hr capsule Take 1 capsule (120 mg total) by mouth daily. 30 capsule 1   Evolocumab (REPATHA SURECLICK) 140 MG/ML SOAJ Inject 140 mg into the skin every 14 (fourteen) days. 6 mL 3   famotidine (PEPCID) 20 MG tablet Take 20 mg by  mouth daily.     hydrALAZINE (APRESOLINE) 50 MG tablet Take 1 tablet (50 mg total) by mouth 3 (three) times daily. 90 tablet 3   HYDROcodone-acetaminophen (NORCO) 10-325 MG tablet Take 1 tablet by mouth 2 (two) times daily as needed. 60 tablet 0   Insulin Glargine (BASAGLAR KWIKPEN) 100 UNIT/ML Inject 10-12 Units into the skin daily.     insulin lispro (HUMALOG) 100 UNIT/ML injection Inject 0.03-0.05 mLs (3-5 Units total) into the skin 3 (three) times daily with meals. 3-4 units prior to breakfast, 4-5 units prior to evening meal 13.5 mL 3   metFORMIN (GLUCOPHAGE) 500 MG tablet Take 2 tablets (1,000 mg total) by mouth 2 (two) times daily with a meal. TAKE 2 TABLETS BY MOUTH TWICE DAILY WITH A MEAL 360 tablet 3   tamsulosin (FLOMAX) 0.4 MG CAPS capsule Take 1 capsule (0.4 mg total) by mouth daily. 90 capsule 3   No current facility-administered medications for this visit.    Allergies:   Amlodipine, Empagliflozin, Amitriptyline, Lisinopril, Nortriptyline hcl, Statins, and Simvastatin    Social History:  The patient  reports that he has been smoking cigarettes. He started smoking about 53 years ago. He has a 48 pack-year smoking history. He has never used smokeless tobacco. He reports that he does not drink alcohol and does not use drugs.   Family History:  The patient's family history includes Diabetes in his mother; Drug abuse in his son; Heart disease in his father and mother; Heart failure in his mother; Sudden death in his brother.    ROS:  Please see the history of present illness.   Otherwise, review of systems are positive for none.   All other systems are reviewed and negative.    PHYSICAL EXAM: VS:  BP (!) 150/70 (BP Location: Left Arm, Patient Position: Sitting)   Pulse 73   Ht 5\' 11"  (1.803 m)   Wt 148 lb 3.2 oz (67.2 kg)   SpO2 98%   BMI 20.67 kg/m  , BMI Body mass index is 20.67 kg/m. GEN: Well nourished, well developed, in no acute distress  HEENT:  normal  Neck: no JVD  or masses.  Bilateral carotid bruits Cardiac: RRR; no murmurs, rubs, or gallops,no edema  Respiratory:  clear to auscultation bilaterally, normal work of breathing GI: soft, nontender, nondistended, + BS MS: no deformity or atrophy  Skin: warm and dry, no rash Neuro:  Strength and sensation are intact Psych: euthymic mood, full affect   EKG:  EKG is not ordered today.     Recent Labs: 04/13/2023: TSH 1.500 10/03/2023: B Natriuretic Peptide 450.8 10/04/2023: ALT 13 10/05/2023: BUN 32; Sodium 139 11/24/2023: Creat 1.28; Hemoglobin 10.1; Platelets 248; Potassium, Bld 5.4    Lipid Panel    Component Value Date/Time   CHOL 118 09/22/2022 0055   CHOL 125 08/03/2021 0850   TRIG 84 09/22/2022 0055   HDL 59 09/22/2022 0055   HDL 64 08/03/2021 0850   CHOLHDL 2.0 09/22/2022 0055   VLDL 17 09/22/2022 0055   LDLCALC 42 09/22/2022 0055   LDLCALC 41 08/03/2021 0850   LDLDIRECT 105 09/19/2016 0922      Wt Readings from Last 3 Encounters:  03/19/24 148 lb 3.2 oz (67.2 kg)  02/20/24 146 lb 2 oz (66.3 kg)  11/30/23 151 lb 8 oz (68.7 kg)          No data to display            ASSESSMENT AND PLAN:  1.  Peripheral arterial disease: Stat is post atherectomy and drug-coated balloon angioplasty to the left SFA in 2023. He reports stable bilateral calf claudication worse on the right side.  He is due for follow-up ABI and duplex which was requested today.  2.  Coronary artery disease involving native coronary arteries without angina: He seems to be doing well from a cardiac standpoint.  3.  Hyperlipidemia: He is intolerant to statin but doing well with Repatha with most recent LDL of 42.  He did run out of Repatha due to cost but he applied for an assistance program and will resume soon.  4.  Essential hypertension: Blood pressure is mildly elevated but seems to be stable.  Continue carvedilol and hydralazine. 5.  Carotid artery disease: Recent carotid Doppler showed stable 60 to  79% right carotid stenosis and 40 to 59% left carotid stenosis.  Repeat studies in 1 year.  6.  Stage III chronic kidney disease: Seems to be stable.  7.  Tobacco use: He cut down to few cigarettes a day but has not been able to quit completely.  I discussed the importance of smoking cessation.    Disposition:   Follow-up in 6 months.  Signed,  Lorine Bears, MD  03/19/2024 10:06 AM    [facility]

## 2024-03-19 NOTE — Patient Instructions (Signed)
 Medication Instructions:  No changes *If you need a refill on your cardiac medications before your next appointment, please call your pharmacy*  Lab Work: None ordered If you have labs (blood work) drawn today and your tests are completely normal, you will receive your results only by: MyChart Message (if you have MyChart) OR A paper copy in the mail If you have any lab test that is abnormal or we need to change your treatment, we will call you to review the results.  Testing/Procedures: Your physician has requested that you have a lower extremity arterial duplex. During this test, ultrasound is used to evaluate arterial blood flow in the legs. Allow one hour for this exam. There are no restrictions or special instructions.   Your physician has requested that you have an ankle brachial index (ABI). During this test an ultrasound and blood pressure cuff are used to evaluate the arteries that supply the arms and legs with blood. Allow thirty minutes for this exam. There are no restrictions or special instructions.    Please note: We ask at that you not bring children with you during ultrasound (echo/ vascular) testing. Due to room size and safety concerns, children are not allowed in the ultrasound rooms during exams. Our front office staff cannot provide observation of children in our lobby area while testing is being conducted. An adult accompanying a patient to their appointment will only be allowed in the ultrasound room at the discretion of the ultrasound technician under special circumstances. We apologize for any inconvenience.    Follow-Up: At [facility], you and your health needs are our priority.  As part of our continuing mission to provide you with exceptional heart care, our providers are all part of one team.  This team includes your primary Cardiologist (physician) and Advanced Practice Providers or APPs (Physician Assistants and Nurse Practitioners) who all work  together to provide you with the care you need, when you need it.  Your next appointment:   6 month(s)  Provider:   Dr. Kirke Corin  We recommend signing up for the patient portal called "MyChart".  Sign up information is provided on this After Visit Summary.  MyChart is used to connect with patients for Virtual Visits (Telemedicine).  Patients are able to view lab/test results, encounter notes, upcoming appointments, etc.  Non-urgent messages can be sent to your provider as well.   To learn more about what you can do with MyChart, go to [email or web address].   Other Instructions Managing the Challenge of Quitting Smoking Quitting smoking is a physical and mental challenge. You may have cravings, withdrawal symptoms, and temptation to smoke. Before quitting, work with your health care provider to make a plan that can help you manage quitting. Making a plan before you quit may keep you from smoking when you have the urge to smoke while trying to quit. How to manage lifestyle changes Managing stress Stress can make you want to smoke, and wanting to smoke may cause stress. It is important to find ways to manage your stress. You could try some of the following: Practice relaxation techniques. Breathe slowly and deeply, in through your nose and out through your mouth. Listen to music. Soak in a bath or take a shower. Imagine a peaceful place or vacation. Get some support. Talk with family or friends about your stress. Join a support group. Talk with a counselor or therapist. Get some physical activity. Go for a walk, run, or bike ride. Play a favorite sport. Practice  yoga.  Medicines Talk with your health care provider about medicines that might help you deal with cravings and make quitting easier for you. Relationships Social situations can be difficult when you are quitting smoking. To manage this, you can: Avoid parties and other social situations where people might be smoking. Avoid  alcohol. Leave right away if you have the urge to smoke. Explain to your family and friends that you are quitting smoking. Ask for support and let them know you might be a bit grumpy. Plan activities where smoking is not an option. General instructions Be aware that many people gain weight after they quit smoking. However, not everyone does. To keep from gaining weight, have a plan in place before you quit, and stick to the plan after you quit. Your plan should include: Eating healthy snacks. When you have a craving, it may help to: Eat popcorn, or try carrots, celery, or other cut vegetables. Chew sugar-free gum. Changing how you eat. Eat small portion sizes at meals. Eat 4-6 small meals throughout the day instead of 1-2 large meals a day. Be mindful when you eat. You should avoid watching television or doing other things that might distract you as you eat. Exercising regularly. Make time to exercise each day. If you do not have time for a long workout, do short bouts of exercise for 5-10 minutes several times a day. Do some form of strengthening exercise, such as weight lifting. Do some exercise that gets your heart beating and causes you to breathe deeply, such as walking fast, running, swimming, or biking. This is very important. Drinking plenty of water or other low-calorie or no-calorie drinks. Drink enough fluid to keep your urine pale yellow.  How to recognize withdrawal symptoms Your body and mind may experience discomfort as you try to get used to not having nicotine in your system. These effects are called withdrawal symptoms. They may include: Feeling hungrier than normal. Having trouble concentrating. Feeling irritable or restless. Having trouble sleeping. Feeling depressed. Craving a cigarette. These symptoms may surprise you, but they are normal to have when quitting smoking. To manage withdrawal symptoms: Avoid places, people, and activities that trigger your  cravings. Remember why you want to quit. Get plenty of sleep. Avoid coffee and other drinks that contain caffeine. These may worsen some of your symptoms. How to manage cravings Come up with a plan for how to deal with your cravings. The plan should include the following: A definition of the specific situation you want to deal with. An activity or action you will take to replace smoking. A clear idea for how this action will help. The name of someone who could help you with this. Cravings usually last for 5-10 minutes. Consider taking the following actions to help you with your plan to deal with cravings: Keep your mouth busy. Chew sugar-free gum. Suck on hard candies or a straw. Brush your teeth. Keep your hands and body busy. Change to a different activity right away. Squeeze or play with a ball. Do an activity or a hobby, such as making bead jewelry, practicing needlepoint, or working with wood. Mix up your normal routine. Take a short exercise break. Go for a quick walk, or run up and down stairs. Focus on doing something kind or helpful for someone else. Call a friend or family member to talk during a craving. Join a support group. Contact a quitline. Where to find support To get help or find a support group: Call the [employer]'s Smoking Quitline: 1-800-QUIT-NOW (324-4010) Text QUIT to SmokefreeTXT: 272536 Where to find more information Visit these websites to find more information on quitting smoking: U.S. Department of Health and Human Services: www.smokefree.gov American Lung Association: www.freedomfromsmoking.org Centers for Disease Control and Prevention (CDC): FootballExhibition.com.br American Heart Association: www.heart.org Contact a health care provider if: You want to change your plan for quitting. The medicines you are taking are not helping. Your eating feels out of control or you cannot sleep. You feel depressed or become very anxious. Summary Quitting  smoking is a physical and mental challenge. You will face cravings, withdrawal symptoms, and temptation to smoke again. Preparation can help you as you go through these challenges. Try different techniques to manage stress, handle social situations, and prevent weight gain. You can deal with cravings by keeping your mouth busy (such as by chewing gum), keeping your hands and body busy, calling family or friends, or contacting a quitline for people who want to quit smoking. You can deal with withdrawal symptoms by avoiding places where people smoke, getting plenty of rest, and avoiding drinks that contain caffeine. This information is not intended to replace advice given to you by your health care provider. Make sure you discuss any questions you have with your health care provider. Document Revised: 11/19/2021 Document Reviewed: 11/19/2021 Elsevier Patient Education  2024 Elsevier Inc.      1st Floor: - Lobby - Registration  - Pharmacy  - Lab - Cafe  2nd Floor: - PV Lab - Diagnostic Testing (echo, CT, nuclear med)  3rd Floor: - Vacant  4th Floor: - TCTS (cardiothoracic surgery) - AFib Clinic - Structural Heart Clinic - Vascular Surgery  - Vascular Ultrasound  5th Floor: - HeartCare Cardiology (general and EP) - Clinical Pharmacy for coumadin, hypertension, lipid, weight-loss medications, and med management appointments    Valet parking services will be available as well.

## 2024-03-20 ENCOUNTER — Encounter: Payer: Self-pay | Admitting: Nurse Practitioner

## 2024-03-20 ENCOUNTER — Ambulatory Visit: Payer: Self-pay | Attending: Nurse Practitioner | Admitting: Cardiology

## 2024-03-20 VITALS — BP 138/60 | HR 60 | Ht 71.0 in | Wt 148.6 lb

## 2024-03-20 DIAGNOSIS — I6523 Occlusion and stenosis of bilateral carotid arteries: Secondary | ICD-10-CM

## 2024-03-20 DIAGNOSIS — E1169 Type 2 diabetes mellitus with other specified complication: Secondary | ICD-10-CM

## 2024-03-20 DIAGNOSIS — I739 Peripheral vascular disease, unspecified: Secondary | ICD-10-CM | POA: Diagnosis not present

## 2024-03-20 DIAGNOSIS — I2510 Atherosclerotic heart disease of native coronary artery without angina pectoris: Secondary | ICD-10-CM | POA: Diagnosis not present

## 2024-03-20 DIAGNOSIS — I10 Essential (primary) hypertension: Secondary | ICD-10-CM

## 2024-03-20 DIAGNOSIS — Z720 Tobacco use: Secondary | ICD-10-CM | POA: Diagnosis not present

## 2024-03-20 DIAGNOSIS — E785 Hyperlipidemia, unspecified: Secondary | ICD-10-CM | POA: Diagnosis not present

## 2024-03-20 NOTE — Progress Notes (Signed)
 Cardiology Office Note:  .   Date:  03/20/2024  ID:  Colin Keller, DOB 1956-03-07, MRN [id number] PCP: McDiarmid, Leighton Roach, MD  [facility] Providers Cardiologist:  Olga Millers, MD PV Cardiologist:  Lorine Bears, MD  1}   History of Present Illness: .   Colin Keller is a 68 y.o. male with history of CAD status post CABG 2019, PAD, hypertension, hyperlipidemia, statin intolerant, type 2 diabetes, tobacco abuse, right subclavian artery stenosis and carotid artery disease, anemia, CKD.  He has primarily been followed for his PAD with bilateral SFA and popliteal disease.  Worsening symptoms in 2023 with progression of resting pain on the left side.  ABI 0.39 on the left.  Angiography October 2023 with no significant aorta iliac disease.  On the left side severely calcified disease affecting the proximal/distal SFA with three-vessel runoff below the knee.  Short occlusion of distal SFA and underwent successful orbital atherectomy and angioplasty to the left SFA with good results.  He was hospitalized October 2024 for DKA and COVID-19.  Jardiance stopped.  He was seen yesterday by Kirke Corin noting stable bilateral calf claudication but worse on the right side.  Routine ABI and duplex was ordered.  Today he reports feeling well overall he still a very active individual and always doing some sort of house project.  He was out painting his house today, actually saw his neighbor passed out and mowed his lawn for him.  Having no chest pain or any significant shortness of breath.  He still notes some leg weakness/claudication.  Otherwise he is doing well and offers no other complaints.   ROS: Denies: Chest pain, shortness of breath, orthopnea, peripheral edema, palpitations, decreased exercise intolerance, fatigue, lightheadedness.   Studies Reviewed: .   Cardiac Studies & Procedures   ______________________________________________________________________________________________ CARDIAC  CATHETERIZATION  CARDIAC CATHETERIZATION 08/20/2018  Narrative  Mid LM to Dist LM lesion is 60% stenosed.  Ost LAD to Prox LAD lesion is 30% stenosed.  Prox LAD to Mid LAD lesion is 30% stenosed.  Ost Cx to Prox Cx lesion is 99% stenosed.  Prox RCA to Mid RCA lesion is 95% stenosed.  1.  Moderately severe distal left main stenosis with positive FFR of 0.75 2.  Critical heavily calcified mid RCA stenosis 3.  Critical heavily calcified proximal circumflex stenosis 4.  Nonobstructive LAD stenosis 5.  Mild segmental LV dysfunction with inferolateral hypokinesis and LVEF 50 to 55% by echo assessment 6.  Normal LVEDP  Recommendations: Surgical evaluation for consideration of CABG.  The patient will be restarted on heparin after his sheath is out.  Case discussed with Dr. Tyrone Sage. Pt stable/chest pain free on heparin.  Findings Coronary Findings Diagnostic  Dominance: Right  Left Main Mid LM to Dist LM lesion is 60% stenosed. FFR = 0.75 with IV adenosine. Distal LM stenosis  Left Anterior Descending Ost LAD to Prox LAD lesion is 30% stenosed. Prox LAD to Mid LAD lesion is 30% stenosed. The lesion is calcified.  Left Circumflex Ost Cx to Prox Cx lesion is 99% stenosed. The lesion is severely calcified. The LCx is underfilled because of critical proximal vessel stenosis. The vessel is severely calcified.  Right Coronary Artery Prox RCA to Mid RCA lesion is 95% stenosed. The lesion is severely calcified. The distal vessel fills antegrade but the branch vessels fill both antegrade and retrograde from collaterals.  Right Posterior Descending Artery Collaterals RPDA filled by collaterals from 1st Sept.  First Right Posterolateral Branch Collaterals 1st  RPL filled by collaterals from 2nd Sept.  Intervention  No interventions have been documented.     ECHOCARDIOGRAM  ECHOCARDIOGRAM COMPLETE 10/08/2018  Narrative *[place]* *[facility]* [address] [place], [place] [address] [phone]  ------------------------------------------------------------------- Transthoracic Echocardiography  Patient:    Colin Keller, Colin Keller MR #:       [id number] Study Date: 10/08/2018 Gender:     M Age:        74 Height:     180.3 cm Weight:     70.6 kg BSA:        1.88 m^2 Pt. Status: Room:       6E15C  ATTENDING    Chaney Malling [facility]sienta PERFORMING   [facility], Inpatient REFERRING    Oralia Manis SONOGRAPHER  Ilona Sorrel ADMITTING    Manson Passey, Carina M ORDERING     [place], Carina M REFERRING    Terisa Starr M  cc:  -------------------------------------------------------------------  ------------------------------------------------------------------- Indications:      CHF - 428.0.  ------------------------------------------------------------------- History:   PMH:  Chronic kidney disease.  Coronary artery disease. Congestive heart failure.  Risk factors:  Current tobacco use. Hypertension. Diabetes mellitus. Dyslipidemia.  ------------------------------------------------------------------- Study Conclusions  - Left ventricle: LVEF is approximately 50% with hypokinesis of the lateral (base/mid) and basal inferior walls The cavity size was normal. - Mitral valve: Calcified annulus. Mildly thickened leaflets . There was mild regurgitation. - Left atrium: The atrium was moderately dilated. - Right atrium: The atrium was mildly dilated.  ------------------------------------------------------------------- Labs, prior tests, procedures, and surgery: Coronary artery bypass grafting.  ------------------------------------------------------------------- Study data:  Comparison was made to the study of 08/20/2018.  Study status:  Routine.  Procedure:  The patient reported no pain pre or post test. Transthoracic echocardiography. Image quality was adequate.          Transthoracic echocardiography.  M-mode, complete 2D, spectral Doppler, and color  Doppler.  Birthdate: Patient birthdate: 09-Nov-1956.  Age:  Patient is 68 yr old.  Sex: Gender: male.    BMI: 21.7 kg/m^2.  Blood pressure:     128/72 Patient status:  Inpatient.  Study date:  Study date: 10/08/2018. Study time: 11:12 AM.  Location:  Bedside.  -------------------------------------------------------------------  ------------------------------------------------------------------- Left ventricle:  LVEF is approximately 50% with hypokinesis of the lateral (base/mid) and basal inferior walls The cavity size was normal.  ------------------------------------------------------------------- Aortic valve:   Mildly thickened leaflets.  Doppler:  There was no significant regurgitation.  ------------------------------------------------------------------- Mitral valve:   Calcified annulus. Mildly thickened leaflets . Doppler:  There was mild regurgitation.    Peak gradient (D): 4 mm Hg.  ------------------------------------------------------------------- Left atrium:  The atrium was moderately dilated.  ------------------------------------------------------------------- Right ventricle:  The cavity size was normal. Wall thickness was normal. Systolic function was normal.  ------------------------------------------------------------------- Tricuspid valve:   Structurally normal valve.   Leaflet separation was normal.  Doppler:  Transvalvular velocity was within the normal range. There was trivial regurgitation.  ------------------------------------------------------------------- Right atrium:  The atrium was mildly dilated.  ------------------------------------------------------------------- Pericardium:  There was no pericardial effusion.  ------------------------------------------------------------------- Systemic veins: Inferior vena cava: The vessel was dilated. The respirophasic diameter changes were blunted (< 50%), consistent with elevated central venous  pressure.  ------------------------------------------------------------------- Measurements  Left ventricle                           Value        Reference LV ID, ED, PLAX chordal  51.1  mm     43 - 52 LV ID, ES, PLAX chordal          (H)     40.4  mm     23 - 38 LV fx shortening, PLAX chordal   (L)     21    %      >=29 LV PW thickness, ED                      10.4  mm     ---------- IVS/LV PW ratio, ED                      1.03         <=1.3 Stroke volume, 2D                        63    ml     ---------- Stroke volume/bsa, 2D                    34    ml/m^2 ---------- LV ejection fraction, 1-p A4C            50    %      ---------- LV end-diastolic volume, 2-p             116   ml     ---------- LV end-systolic volume, 2-p              62    ml     ---------- LV ejection fraction, 2-p                47    %      ---------- Stroke volume, 2-p                       55    ml     ---------- LV end-diastolic volume/bsa, 2-p         62    ml/m^2 ---------- LV end-systolic volume/bsa, 2-p          33    ml/m^2 ---------- Stroke volume/bsa, 2-p                   29    ml/m^2 ---------- LV e&', lateral                           8.81  cm/s   ---------- LV E/e&', lateral                         11.1         ---------- LV e&', medial                            4.57  cm/s   ---------- LV E/e&', medial                          21.4         ---------- LV e&', average                           6.69  cm/s   ---------- LV E/e&', average  14.62        ----------  Ventricular septum                       Value        Reference IVS thickness, ED                        10.7  mm     ----------  LVOT                                     Value        Reference LVOT ID, S                               20    mm     ---------- LVOT area                                3.14  cm^2   ---------- LVOT peak velocity, S                    95.3  cm/s   ---------- LVOT mean  velocity, S                    64    cm/s   ---------- LVOT VTI, S                              20.2  cm     ---------- LVOT peak gradient, S                    4     mm Hg  ----------  Aorta                                    Value        Reference Aortic root ID, ED                       33    mm     ----------  Left atrium                              Value        Reference LA ID, A-P, ES                           39    mm     ---------- LA ID/bsa, A-P                           2.08  cm/m^2 <=2.2 LA volume, S                             89.2  ml     ---------- LA volume/bsa, S  47.5  ml/m^2 ---------- LA volume, ES, 1-p A4C                   85.6  ml     ---------- LA volume/bsa, ES, 1-p A4C               45.6  ml/m^2 ---------- LA volume, ES, 1-p A2C                   83.9  ml     ---------- LA volume/bsa, ES, 1-p A2C               44.7  ml/m^2 ----------  Mitral valve                             Value        Reference Mitral E-wave peak velocity              97.8  cm/s   ---------- Mitral A-wave peak velocity              64.8  cm/s   ---------- Mitral deceleration time                 158   ms     150 - 230 Mitral peak gradient, D                  4     mm Hg  ---------- Mitral E/A ratio, peak                   1.5          ----------  Pulmonary arteries                       Value        Reference PA pressure, S, DP                       27    mm Hg  <=30  Tricuspid valve                          Value        Reference Tricuspid regurg peak velocity           216   cm/s   ---------- Tricuspid peak RV-RA gradient            19    mm Hg  ----------  Right atrium                             Value        Reference RA ID, S-I, ES, A4C              (H)     56.5  mm     34 - 49 RA area, ES, A4C                 (H)     20.3  cm^2   8.3 - 19.5 RA volume, ES, A/L                       61.1  ml     ---------- RA volume/bsa, ES, A/L  32.6  ml/m^2  ----------  Systemic veins                           Value        Reference Estimated CVP                            8     mm Hg  ----------  Right ventricle                          Value        Reference TAPSE                                    14.1  mm     ---------- RV pressure, S, DP                       27    mm Hg  <=30 RV s&', lateral, S                        7.62  cm/s   ----------  Legend: (L)  and  (H)  mark values outside specified reference range.  ------------------------------------------------------------------- Prepared and Electronically Authenticated by  Dietrich Pates, M.D. 2019-10-28T18:17:28   TEE  ECHO TEE 08/23/2018  Interpretation Summary  Mitral valve: Mild regurgitation.  Right ventricle: Normal cavity size, wall thickness and ejection fraction.        ______________________________________________________________________________________________       Risk Assessment/Calculations:             Physical Exam:   VS:  BP 138/60 (BP Location: Left Arm, Patient Position: Sitting, Cuff Size: Normal)   Pulse 60   Ht 5\' 11"  (1.803 m)   Wt 148 lb 9.6 oz (67.4 kg)   SpO2 98%   BMI 20.73 kg/m    Wt Readings from Last 3 Encounters:  03/20/24 148 lb 9.6 oz (67.4 kg)  03/19/24 148 lb 3.2 oz (67.2 kg)  02/20/24 146 lb 2 oz (66.3 kg)    GEN: Well nourished, well developed in no acute distress NECK: No JVD; No carotid bruits CARDIAC: RRR, no murmurs, rubs, gallops RESPIRATORY:  Clear to auscultation without rales, wheezing or rhonchi  ABDOMEN: Soft, non-tender, non-distended EXTREMITIES:  No edema; No deformity   ASSESSMENT AND PLAN: .   CAD status post CABG 2019 Stable disease without any angina. Continue with aspirin, Coreg 25 mg, Repatha  PAD status post arthrectomy and angioplasty of left SFA 2023 Follows with Dr. Kirke Corin.  He has pending ABIs and lower extremity arterial Dopplers.  Carotid artery disease Most recent Doppler showing stable  60-79 right carotid stenosis and 40 to 59% stenosis in the left carotid artery.  Due to repeat studies in 1 year.  Hypertension This is primarily being managed by his PCP.  He is on Coreg 25 mg, diltiazem 120 mg, hydralazine 50 mg 3 times daily  Hyperlipidemia Statin intolerant Most recent LDL 42. On Repatha, patient assistance approved and has refills now.  Type 2 diabetes Uncontrolled A1c 4 weeks ago was 10.1% on insuiln. Had urinary issues and dehydration with jardiance in the setting of COVID  Nicotine dependence He is trying to quit, he is down to a few cigarettes a  day  CKD Anemia  Followed by nephro .       Dispo: 1 year follow-up with Dr. Jens Som  Signed, Abagail Kitchens, PA-C

## 2024-03-20 NOTE — Patient Instructions (Signed)
 Medication Instructions:  Your physician recommends that you continue on your current medications as directed. Please refer to the Current Medication list given to you today.  *If you need a refill on your cardiac medications before your next appointment, please call your pharmacy*  Lab Work: NONE ordered at this time of appointment    Testing/Procedures: NONE ordered at this time of appointment    Follow-Up: At [facility], you and your health needs are our priority.  As part of our continuing mission to provide you with exceptional heart care, our providers are all part of one team.  This team includes your primary Cardiologist (physician) and Advanced Practice Providers or APPs (Physician Assistants and Nurse Practitioners) who all work together to provide you with the care you need, when you need it.  Your next appointment:   1 year(s)  Provider:   Olga Millers, MD     We recommend signing up for the patient portal called "MyChart".  Sign up information is provided on this After Visit Summary.  MyChart is used to connect with patients for Virtual Visits (Telemedicine).  Patients are able to view lab/test results, encounter notes, upcoming appointments, etc.  Non-urgent messages can be sent to your provider as well.   To learn more about what you can do with MyChart, go to [email or web address].   Other Instructions       1st Floor: - Lobby - Registration  - Pharmacy  - Lab - Cafe  2nd Floor: - PV Lab - Diagnostic Testing (echo, CT, nuclear med)  3rd Floor: - Vacant  4th Floor: - TCTS (cardiothoracic surgery) - AFib Clinic - Structural Heart Clinic - Vascular Surgery  - Vascular Ultrasound  5th Floor: - HeartCare Cardiology (general and EP) - Clinical Pharmacy for coumadin, hypertension, lipid, weight-loss medications, and med management appointments    Valet parking services will be available as well.

## 2024-04-15 ENCOUNTER — Other Ambulatory Visit: Payer: Self-pay | Admitting: Family Medicine

## 2024-04-15 DIAGNOSIS — G629 Polyneuropathy, unspecified: Secondary | ICD-10-CM

## 2024-04-15 DIAGNOSIS — M545 Low back pain, unspecified: Secondary | ICD-10-CM

## 2024-04-15 DIAGNOSIS — G8929 Other chronic pain: Secondary | ICD-10-CM

## 2024-04-15 DIAGNOSIS — M48062 Spinal stenosis, lumbar region with neurogenic claudication: Secondary | ICD-10-CM

## 2024-04-16 ENCOUNTER — Other Ambulatory Visit: Payer: Self-pay | Admitting: Family Medicine

## 2024-04-16 DIAGNOSIS — E1159 Type 2 diabetes mellitus with other circulatory complications: Secondary | ICD-10-CM

## 2024-04-17 ENCOUNTER — Other Ambulatory Visit: Payer: Self-pay

## 2024-04-17 ENCOUNTER — Ambulatory Visit (HOSPITAL_COMMUNITY)
Admission: RE | Admit: 2024-04-17 | Discharge: 2024-04-17 | Disposition: A | Discharge status: Home / Self Care | Source: Ambulatory Visit | Attending: Cardiovascular Disease | Admitting: Cardiovascular Disease

## 2024-04-17 ENCOUNTER — Ambulatory Visit (HOSPITAL_BASED_OUTPATIENT_CLINIC_OR_DEPARTMENT_OTHER)
Admission: RE | Admit: 2024-04-17 | Discharge: 2024-04-17 | Disposition: A | Discharge status: Home / Self Care | Source: Ambulatory Visit | Attending: Cardiovascular Disease | Admitting: Cardiovascular Disease

## 2024-04-17 DIAGNOSIS — E11319 Type 2 diabetes mellitus with unspecified diabetic retinopathy without macular edema: Secondary | ICD-10-CM

## 2024-04-17 DIAGNOSIS — I739 Peripheral vascular disease, unspecified: Secondary | ICD-10-CM | POA: Insufficient documentation

## 2024-04-17 DIAGNOSIS — E1151 Type 2 diabetes mellitus with diabetic peripheral angiopathy without gangrene: Secondary | ICD-10-CM

## 2024-04-17 MED ORDER — INSULIN LISPRO 100 UNIT/ML IJ SOLN
3.0000 [IU] | Freq: Three times a day (TID) | INTRAMUSCULAR | 1 refills | Status: DC
Start: 2024-04-17 — End: 2024-07-30

## 2024-04-19 LAB — VAS US ABI WITH/WO TBI
Left ABI: 0.9
Right ABI: 0.75

## 2024-04-23 ENCOUNTER — Telehealth: Payer: Self-pay | Admitting: Cardiovascular Disease

## 2024-04-23 ENCOUNTER — Ambulatory Visit: Payer: Self-pay

## 2024-04-23 NOTE — Telephone Encounter (Signed)
 Pt returning call to a nurse for results

## 2024-04-23 NOTE — Telephone Encounter (Signed)
 Called patient, gave results.   Patient verbalized understanding, aware he needs to be seen in 1 month, scheduled at the [facility] office (per patient request) on June 24th at 10:00 AM.

## 2024-04-24 ENCOUNTER — Other Ambulatory Visit: Payer: Self-pay | Admitting: Cardiovascular Disease

## 2024-04-24 DIAGNOSIS — E1159 Type 2 diabetes mellitus with other circulatory complications: Secondary | ICD-10-CM

## 2024-04-24 NOTE — Telephone Encounter (Signed)
 Refill request

## 2024-05-10 ENCOUNTER — Telehealth: Payer: Self-pay

## 2024-05-10 DIAGNOSIS — E1151 Type 2 diabetes mellitus with diabetic peripheral angiopathy without gangrene: Secondary | ICD-10-CM

## 2024-05-10 NOTE — Telephone Encounter (Signed)
 Received call from patient regarding CGM supplies.   He reports that he has had a change in insurance and is no longer able to get supplies through [facility].   His new insurance HTA, allows him to get sensors at local pharmacy.   Will forward to PCP to send in new prescription to [facility].   Elsie Halo, RN

## 2024-05-14 ENCOUNTER — Telehealth: Payer: Self-pay | Admitting: Pharmacy Technician

## 2024-05-14 DIAGNOSIS — E785 Hyperlipidemia, unspecified: Secondary | ICD-10-CM

## 2024-05-14 NOTE — Telephone Encounter (Signed)
 Hi, we received notification that the repatha  needs a prior authorization. Insurance is asking for updated labs to prove LDL reduction. The last labs were done 09/22/2022. Can he get updated labs so we can submit the prior authorization? Thank you

## 2024-05-15 MED ORDER — FREESTYLE LIBRE 2 SENSOR MISC: 1.0000 | 0 refills | Status: DC

## 2024-05-15 NOTE — Telephone Encounter (Signed)
 Ok to get lipid profile.

## 2024-05-16 ENCOUNTER — Other Ambulatory Visit: Payer: Self-pay | Admitting: Family Medicine

## 2024-05-16 DIAGNOSIS — G629 Polyneuropathy, unspecified: Secondary | ICD-10-CM

## 2024-05-16 DIAGNOSIS — G8929 Other chronic pain: Secondary | ICD-10-CM

## 2024-05-16 DIAGNOSIS — M545 Low back pain, unspecified: Secondary | ICD-10-CM

## 2024-05-16 DIAGNOSIS — M48062 Spinal stenosis, lumbar region with neurogenic claudication: Secondary | ICD-10-CM

## 2024-05-16 NOTE — Telephone Encounter (Signed)
Left a message for the patient to call back.  Lab orders have been placed. 

## 2024-05-17 NOTE — Telephone Encounter (Signed)
 Message has been left about the need for labwork.

## 2024-05-20 DIAGNOSIS — N179 Acute kidney failure, unspecified: Secondary | ICD-10-CM | POA: Diagnosis not present

## 2024-05-20 DIAGNOSIS — N189 Chronic kidney disease, unspecified: Secondary | ICD-10-CM | POA: Diagnosis not present

## 2024-05-20 DIAGNOSIS — E1129 Type 2 diabetes mellitus with other diabetic kidney complication: Secondary | ICD-10-CM | POA: Diagnosis not present

## 2024-05-20 DIAGNOSIS — I129 Hypertensive chronic kidney disease with stage 1 through stage 4 chronic kidney disease, or unspecified chronic kidney disease: Secondary | ICD-10-CM | POA: Diagnosis not present

## 2024-05-20 DIAGNOSIS — D631 Anemia in chronic kidney disease: Secondary | ICD-10-CM | POA: Diagnosis not present

## 2024-05-20 DIAGNOSIS — N2581 Secondary hyperparathyroidism of renal origin: Secondary | ICD-10-CM | POA: Diagnosis not present

## 2024-05-20 DIAGNOSIS — R809 Proteinuria, unspecified: Secondary | ICD-10-CM | POA: Diagnosis not present

## 2024-05-20 DIAGNOSIS — E1122 Type 2 diabetes mellitus with diabetic chronic kidney disease: Secondary | ICD-10-CM | POA: Diagnosis not present

## 2024-05-20 DIAGNOSIS — N1832 Chronic kidney disease, stage 3b: Secondary | ICD-10-CM | POA: Diagnosis not present

## 2024-05-20 NOTE — Telephone Encounter (Signed)
 Third attempt to reach the patient. Encounter closed

## 2024-06-01 ENCOUNTER — Other Ambulatory Visit: Payer: Self-pay | Admitting: Family Medicine

## 2024-06-01 DIAGNOSIS — E1151 Type 2 diabetes mellitus with diabetic peripheral angiopathy without gangrene: Secondary | ICD-10-CM

## 2024-06-04 ENCOUNTER — Ambulatory Visit: Admitting: Cardiovascular Disease

## 2024-06-05 ENCOUNTER — Telehealth: Payer: Self-pay | Admitting: Pharmacy Technician

## 2024-06-05 ENCOUNTER — Other Ambulatory Visit: Payer: Self-pay | Admitting: *Deleted

## 2024-06-05 DIAGNOSIS — E785 Hyperlipidemia, unspecified: Secondary | ICD-10-CM

## 2024-06-05 NOTE — Telephone Encounter (Signed)
 Received another CMM from [facility] for repatha . I called the patient and he said he will come in to have his labs done when he comes in for his appt on 06/11/24 at [facility]. He is also aware of the [facility] in [place]. We will run the prior auth after we get the labs

## 2024-06-11 ENCOUNTER — Telehealth: Payer: Self-pay | Admitting: Internal Medicine

## 2024-06-11 ENCOUNTER — Encounter: Payer: Self-pay | Admitting: Cardiovascular Disease

## 2024-06-11 ENCOUNTER — Ambulatory Visit: Attending: Cardiovascular Disease | Admitting: Cardiovascular Disease

## 2024-06-11 VITALS — BP 162/74 | HR 59 | Ht 71.0 in | Wt 137.2 lb

## 2024-06-11 DIAGNOSIS — I2510 Atherosclerotic heart disease of native coronary artery without angina pectoris: Secondary | ICD-10-CM

## 2024-06-11 DIAGNOSIS — I739 Peripheral vascular disease, unspecified: Secondary | ICD-10-CM

## 2024-06-11 DIAGNOSIS — I10 Essential (primary) hypertension: Secondary | ICD-10-CM

## 2024-06-11 DIAGNOSIS — E785 Hyperlipidemia, unspecified: Secondary | ICD-10-CM | POA: Diagnosis not present

## 2024-06-11 DIAGNOSIS — E1159 Type 2 diabetes mellitus with other circulatory complications: Secondary | ICD-10-CM

## 2024-06-11 DIAGNOSIS — Z720 Tobacco use: Secondary | ICD-10-CM

## 2024-06-11 DIAGNOSIS — E1151 Type 2 diabetes mellitus with diabetic peripheral angiopathy without gangrene: Secondary | ICD-10-CM

## 2024-06-11 LAB — LIPID PANEL

## 2024-06-11 MED ORDER — HYDRALAZINE HCL 50 MG PO TABS: 50.0000 mg | ORAL_TABLET | Freq: Two times a day (BID) | ORAL | 1 refills | Status: DC

## 2024-06-11 NOTE — Telephone Encounter (Incomplete)
 Received a critical lab value for a K of 6.4. I talked to Alm Gaskins and his wife over the phone. Was previously on hydralazine , but this was discontinued a month or two ago. Currently taking glargine, lispro (last dose was 10pm), carvedilol , and diltiazem . No change in urine output. ROS negative for dysuria, oliguria or abdominal/pelvic pain, fever, chills, or urinary incontinence. Glucose from CGM read 249.

## 2024-06-11 NOTE — Patient Instructions (Signed)
 Medication Instructions:  No changes *If you need a refill on your cardiac medications before your next appointment, please call your pharmacy*  Lab Work: Your provider would like for you to have the following labs today: CMET and Lipid  If you have labs (blood work) drawn today and your tests are completely normal, you will receive your results only by: MyChart Message (if you have MyChart) OR A paper copy in the mail If you have any lab test that is abnormal or we need to change your treatment, we will call you to review the results.  Testing/Procedures: None ordered  Follow-Up: At [facility], you and your health needs are our priority.  As part of our continuing mission to provide you with exceptional heart care, our providers are all part of one team.  This team includes your primary Cardiologist (physician) and Advanced Practice Providers or APPs (Physician Assistants and Nurse Practitioners) who all work together to provide you with the care you need, when you need it.  Your next appointment:   6 month(s)  Provider:   Dr. Darron  We recommend signing up for the patient portal called MyChart.  Sign up information is provided on this After Visit Summary.  MyChart is used to connect with patients for Virtual Visits (Telemedicine).  Patients are able to view lab/test results, encounter notes, upcoming appointments, etc.  Non-urgent messages can be sent to your provider as well.   To learn more about what you can do with MyChart, go to [email or web address].

## 2024-06-11 NOTE — Progress Notes (Signed)
 Cardiology Office Note   Date:  06/11/2024   ID:  Colin Keller, DOB 1956/09/11, MRN [id number]  PCP:  McDiarmid, Krystal BIRCH, MD  Cardiologist: Dr. Pietro  No chief complaint on file.      History of Present Illness: Colin Keller is a 68 y.o. male who is here today for follow-up visit regarding peripheral arterial disease.  He has known history of coronary artery disease with previous non-ST elevation myocardial infarction in 2019.  Cardiac catheterization showed severe three-vessel coronary artery disease and he was treated with CABG.  He has chronic medical conditions include hyperlipidemia with intolerance to statins, chronic kidney disease, type 2 diabetes and carotid artery disease. Previous screening for abdominal aortic aneurysm in 2020 was negative.  Moderately elevated velocities were noted in the iliac arteries. He has been followed for peripheral arterial disease with bilateral SFA and popliteal disease.  He had worsening symptoms in 2023 with progression to rest pain on the left side.  Repeat Doppler studies showed a drop in ABI to 0.39 on the left. Angiography in October 2023 showed no significant aortoiliac disease.  On the left side, there was severely calcified disease affecting the proximal as well as distal SFA with three-vessel runoff below the knee.  There was short occlusion of the distal SFA.  I performed successful orbital atherectomy and drug-coated balloon angioplasty to the left SFA with excellent results.  He was hospitalized in October, 2024  with hyperglycemia and ketoacidosis in the setting of COVID-19 infection.  Jardiance  was discontinued.  He had acute on chronic renal disease that improved with hydration.  He was found to have mildly elevated troponin which was felt to be due to supply demand ischemia.  Most lower extremity arterial Doppler study showed an ABI of 0.75 on the right and 0.90 on the left.  Duplex on the left showed severe focal stenosis in the  mid SFA with peak velocity of almost 500.  He reports stable bilateral leg claudication which is worse on the right than the left.  He ran out of hydralazine .  He continues to smoke 6 cigarettes a day.     Past Medical History:  Diagnosis Date   Acute diastolic heart failure (HCC) 08/18/2018   AKI (acute kidney injury) (HCC) 10/04/2023   Atherosclerosis of both carotid arteries 08/18/2020   Carotid dopplers 08/2019 40-59% bilateral stenosis and right subclavian stenosis   Bilateral carotid artery stenosis 08/18/2020   Right Carotid: Velocities in the right ICA are consistent with a 60-79%                 stenosis. Non-hemodynamically significant plaque <50% noted in                 the CCA. The ECA appears >50% stenosed. Proximal subclavian                 artery dilatation with elevated and turbulent flow just past the                 narrowing, measuring 1.6 cm.   Left Carotid: Velocities in the left ICA are    CHF (congestive heart failure) (HCC)    Chronic congestive heart failure (HCC) 10/04/2023   Transthoracic echocardiogram 10/08/2018: LVEF is approximately 50% with hypokinesis of the lateral (base/mid) and basal inferior walls T     Chronic left shoulder pain 03/18/2021   Chronic low back pain without sciatica 10/16/2008   H/o chronic LBP with h/o  lumbar disc herniation and intermittent radicular pain  Chronic pain, on stable doses of Norco 10/325 TID. MRI in 2003 showing SMALL CENTRAL DISC HERNIATION AT L4-5.  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      CKD (chronic kidney disease), stage III (HCC)    Coronary artery disease    Coronary artery disease involving native heart with angina pectoris, unspecified vessel or lesion type (HCC) 08/10/2023   COVID 10/04/2023   Diabetes mellitus    DKA (diabetic ketoacidosis) (HCC) 10/03/2023   Dyspnea    Fall 10/04/2023   GERD (gastroesophageal reflux disease)    Hx of CABG 08/23/2018   LIMA to LAD, RIMA to RCA,  SVG to OM3, EVH via right thigh   Hyperlipidemia    Hypertension    Hypertension associated with diabetes (HCC) 12/22/2008   Qualifier: Diagnosis of  By: Edrick MD, Susan     Hypertensive nephropathy 08/18/2020   Insulin  dependent type 2 diabetes mellitus (HCC) 12/22/2020   Left ventricular dysfunction 08/18/2020   TTE ([id number]): LVEF is approximately 50% with hypokinesis of the lateral (base/mid) and basal inferior walls The cavity size was normal.      Long term (current) use of antithrombotics/antiplatelets 09/21/2022   Planned 23-months DAPT for DES for PAD on 09/21/2022.  Planned 6 months therapy. End 03/23/23.   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   LIMA to LAD, RIMA to RCA, SVG to OM3, [facility] via right thigh   Sleeping difficulties 09/08/2019   Subclavian artery stenosis, right (HCC) 08/18/2020   Carotid dopplers 08/2019 40-59% bilateral stenosis and right subclavian stenosis   Tobacco abuse    Tobacco abuse disorder    Qualifier: Diagnosis of  By: Edrick MD, Devere      Past Surgical History:  Procedure Laterality Date   ABDOMINAL AORTOGRAM W/LOWER EXTREMITY N/A 09/21/2022   Procedure: ABDOMINAL AORTOGRAM W/LOWER EXTREMITY;  Surgeon: Darron Deatrice LABOR, MD;  Location: [facility] INVASIVE CV LAB;  Service: Cardiovascular;  Laterality: N/A;   CORONARY ARTERY BYPASS GRAFT N/A 08/23/2018   Procedure: CORONARY ARTERY BYPASS GRAFTING (CABG) x 3; -Left Internal Mammary Artery to Left Anterior Descending Artery, -Right Internal Mammary Artery  to Right Coronary Artery, -Saphenous Vein Graft to Obtuse Marginal;  ENDOSCOPIC HARVEST GREATER SAPHENOUS VEIN  -Right Thigh;  Surgeon: Dusty Sudie DEL, MD;  Location: [facility] OR;  Service: Open Heart Surgery;  Laterality: N/A;   CORONARY PRESSURE/FFR STUDY N/A 08/20/2018   Procedure: INTRAVASCULAR PRESSURE WIRE/FFR STUDY;  Surgeon: Wonda Sharper, MD;  Location: [facility] INVASIVE CV LAB;  Service: Cardiovascular;  Laterality: N/A;   LEFT HEART CATH AND CORONARY ANGIOGRAPHY N/A 08/20/2018   Procedure: LEFT HEART CATH AND CORONARY ANGIOGRAPHY;  Surgeon: Wonda Sharper, MD;  Location: [facility] INVASIVE CV LAB;  Service: Cardiovascular;  Laterality: N/A;   PERIPHERAL VASCULAR ATHERECTOMY Left 09/21/2022   Procedure: PERIPHERAL VASCULAR ATHERECTOMY;  Surgeon: Darron Deatrice LABOR, MD;  Location: [facility] INVASIVE CV LAB;  Service: Cardiovascular;  Laterality: Left;  SFA   PERIPHERAL VASCULAR BALLOON ANGIOPLASTY Left 09/21/2022   Procedure: PERIPHERAL VASCULAR BALLOON ANGIOPLASTY;  Surgeon: Darron Deatrice LABOR, MD;  Location: [facility] INVASIVE CV LAB;  Service: Cardiovascular;  Laterality: Left;  SFA   TEE WITHOUT CARDIOVERSION N/A 08/23/2018   Procedure: TRANSESOPHAGEAL ECHOCARDIOGRAM (TEE);  Surgeon: Dusty Sudie DEL, MD;  Location: [facility] OR;  Service: Open Heart Surgery;  Laterality: N/A;     Current Outpatient Medications  Medication Sig Dispense Refill   acetaminophen  (TYLENOL ) 500 MG tablet Take 1,000 mg by mouth once as needed for moderate pain (pain score 4-6).     aspirin  81 MG tablet Take 81 mg by mouth daily.     carvedilol  (COREG ) 25 MG tablet TAKE 1 TABLET BY MOUTH TWICE DAILY WITH A MEAL 180 tablet 3   Continuous Glucose Sensor (FREESTYLE LIBRE 2 SENSOR) MISC USE 1 DEVICE EVERY 14 DAYS 1 each 11   Cyanocobalamin (VITAMIN B 12 PO) Take 1 tablet by mouth daily.     diltiazem  (CARDIZEM  CD) 120 MG 24 hr capsule TAKE 1 CAPSULE BY MOUTH DAILY 30 capsule 1   Evolocumab  (REPATHA  SURECLICK) 140 MG/ML SOAJ Inject 140 mg into the skin  every 14 (fourteen) days. 6 mL 3   famotidine  (PEPCID ) 20 MG tablet Take 20 mg by mouth daily.     HYDROcodone -acetaminophen  (NORCO) 10-325 MG tablet TAKE 1 TABLET BY MOUTH TWICE DAILY AS NEEDED 60 tablet 0   Insulin  Glargine (BASAGLAR  KWIKPEN) 100 UNIT/ML Inject 10-12 Units into the skin daily.     insulin  lispro (HUMALOG ) 100 UNIT/ML injection Inject 0.03-0.05 mLs (3-5 Units total) into the skin 3 (three) times daily with meals. 3-4 units prior to breakfast, 4-5 units prior to evening meal 13.5 mL 1   metFORMIN  (GLUCOPHAGE ) 500 MG tablet Take 2 tablets (1,000 mg total) by mouth 2 (two) times daily with a meal. TAKE 2 TABLETS BY MOUTH TWICE DAILY WITH A MEAL 360 tablet 3   tamsulosin  (FLOMAX ) 0.4 MG CAPS capsule Take 1 capsule (0.4 mg total) by mouth daily. 90 capsule 3   No current facility-administered medications for this visit.    Allergies:   Amlodipine , Empagliflozin , Amitriptyline , Lisinopril , Nortriptyline  hcl, Statins, and Simvastatin     Social History:  The patient  reports that he has been smoking cigarettes. He started smoking about 53 years ago. He has a 48 pack-year smoking history. He has never used smokeless tobacco. He reports that he does not drink alcohol and does not use drugs.   Family History:  The patient's family history includes Diabetes in his mother; Drug abuse in his son; Heart disease in his father and mother; Heart failure in his mother; Sudden death in his brother.    ROS:  Please see the history of present illness.   Otherwise, review of systems are positive for none.   All other systems are reviewed and negative.    PHYSICAL EXAM: VS:  BP (!) 162/74 (BP Location: Left Arm, Patient Position: Sitting)   Pulse (!) 59   Ht 5' 11 (1.803 m)   Wt 137 lb 3.2 oz (62.2 kg)   SpO2 97%   BMI 19.14 kg/m  , BMI Body mass index is 19.14 kg/m. GEN: Well nourished, well developed, in no acute distress  HEENT: normal  Neck: no JVD  or masses.  Bilateral carotid  bruits Cardiac: RRR; no murmurs, rubs, or gallops,no edema  Respiratory:  clear to auscultation bilaterally, normal work of breathing GI: soft, nontender, nondistended, + BS MS: no deformity or atrophy  Skin: warm and dry, no rash Neuro:  Strength and sensation are intact Psych: euthymic mood, full affect   EKG:  EKG is  ordered today. EKG showed sinus bradycardia.    Recent Labs: 10/03/2023: B Natriuretic Peptide 450.8 10/04/2023: ALT 13 10/05/2023: BUN 32; Sodium 139 11/24/2023: Creat 1.28; Hemoglobin 10.1; Platelets 248; Potassium, Bld 5.4    Lipid Panel    Component Value Date/Time   CHOL 118 09/22/2022 0055   CHOL 125 08/03/2021 0850   TRIG 84 09/22/2022 0055   HDL 59 09/22/2022 0055   HDL 64 08/03/2021 0850   CHOLHDL 2.0 09/22/2022 0055   VLDL 17 09/22/2022 0055   LDLCALC 42 09/22/2022 0055   LDLCALC 41 08/03/2021 0850   LDLDIRECT 105 09/19/2016 0922      Wt Readings from Last 3 Encounters:  06/11/24 137 lb 3.2 oz (62.2 kg)  03/20/24 148 lb 9.6 oz (67.4 kg)  03/19/24 148 lb 3.2 oz (67.2 kg)          No data to display            ASSESSMENT AND PLAN:  1.  Peripheral arterial disease: Stat is post atherectomy and drug-coated balloon angioplasty to the left SFA in 2023. He has focal restenosis in the left mid SFA.  However, his symptoms are unchanged and if anything he reports improvement.  He is known to have significant right SFA disease as well.  Continue medical therapy for now and I asked him to let me know if his symptoms worsen.  2.  Coronary artery disease involving native coronary arteries without angina: He seems to be doing well from a cardiac standpoint.  3.  Hyperlipidemia: He is intolerant to statin but doing well with Repatha .  I requested follow-up lipid and liver profile.  4.  Essential hypertension: His blood pressure is elevated but he ran out of hydralazine  which was refilled today.  He does have underlying chronic kidney disease  and will check renal function.  5.  Carotid artery disease: Recent carotid Doppler showed stable 60 to 79% right carotid stenosis and 40 to 59% left carotid stenosis.  Repeat studies in 1 year.  6.  Stage III chronic kidney disease: Seems to be stable.  7.  Tobacco use: He cut down to few cigarettes a day but has not been able to quit completely.  I discussed the importance of smoking cessation.    Disposition:   Follow-up in 6 months.  Signed,  Deatrice Cage, MD  06/11/2024 10:16 AM    [facility]
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
## 2024-06-12 ENCOUNTER — Other Ambulatory Visit: Payer: Self-pay | Admitting: Family Medicine

## 2024-06-12 ENCOUNTER — Other Ambulatory Visit (HOSPITAL_COMMUNITY): Payer: Self-pay

## 2024-06-12 ENCOUNTER — Other Ambulatory Visit: Payer: Self-pay | Admitting: *Deleted

## 2024-06-12 ENCOUNTER — Ambulatory Visit: Payer: Self-pay | Admitting: Cardiovascular Disease

## 2024-06-12 DIAGNOSIS — M545 Low back pain, unspecified: Secondary | ICD-10-CM

## 2024-06-12 DIAGNOSIS — E785 Hyperlipidemia, unspecified: Secondary | ICD-10-CM | POA: Diagnosis not present

## 2024-06-12 DIAGNOSIS — E1151 Type 2 diabetes mellitus with diabetic peripheral angiopathy without gangrene: Secondary | ICD-10-CM

## 2024-06-12 DIAGNOSIS — G8929 Other chronic pain: Secondary | ICD-10-CM

## 2024-06-12 DIAGNOSIS — I739 Peripheral vascular disease, unspecified: Secondary | ICD-10-CM

## 2024-06-12 DIAGNOSIS — M48062 Spinal stenosis, lumbar region with neurogenic claudication: Secondary | ICD-10-CM

## 2024-06-12 DIAGNOSIS — E1159 Type 2 diabetes mellitus with other circulatory complications: Secondary | ICD-10-CM

## 2024-06-12 DIAGNOSIS — E875 Hyperkalemia: Secondary | ICD-10-CM

## 2024-06-12 DIAGNOSIS — G629 Polyneuropathy, unspecified: Secondary | ICD-10-CM

## 2024-06-12 LAB — COMPREHENSIVE METABOLIC PANEL WITH GFR
ALT: 8 IU/L (ref 0–44)
AST: 13 IU/L (ref 0–40)
Albumin: 3.6 g/dL — ABNORMAL LOW (ref 3.9–4.9)
Alkaline Phosphatase: 72 IU/L (ref 44–121)
BUN/Creatinine Ratio: 19 (ref 10–24)
BUN: 36 mg/dL — ABNORMAL HIGH (ref 8–27)
Bilirubin Total: <0.2 mg/dL (ref 0.0–1.2)
CO2: 21 mmol/L (ref 20–29)
Calcium: 9.2 mg/dL (ref 8.6–10.2)
Chloride: 106 mmol/L (ref 96–106)
Creatinine, Ser: 1.92 mg/dL — ABNORMAL HIGH (ref 0.76–1.27)
Globulin, Total: 2.6 g/dL (ref 1.5–4.5)
Glucose: 301 mg/dL — ABNORMAL HIGH (ref 70–99)
Potassium: 6.4 mmol/L (ref 3.5–5.2)
Sodium: 140 mmol/L (ref 134–144)
Total Protein: 6.2 g/dL (ref 6.0–8.5)
eGFR: 37 mL/min/1.73 — ABNORMAL LOW (ref >59)

## 2024-06-12 LAB — LIPID PANEL
Cholesterol, Total: 129 mg/dL (ref 100–199)
HDL: 64 mg/dL (ref >39)
LDL CALC COMMENT:: 2 ratio (ref 0.0–5.0)
LDL Chol Calc (NIH): 42 mg/dL (ref 0–99)
Triglycerides: 132 mg/dL (ref 0–149)
VLDL Cholesterol Cal: 23 mg/dL (ref 5–40)

## 2024-06-12 NOTE — Telephone Encounter (Signed)
 Pharmacy Patient Advocate Encounter   Received notification from Pt Calls Messages that prior authorization for REPATHA  140MG  is required/requested.   Insurance verification completed.   The patient is insured through [facility] ADVANTAGE/RX ADVANCE .   Per test claim: PA required; PA submitted to above mentioned insurance via CoverMyMeds Key/confirmation #/EOC [id number] Status is pending

## 2024-06-12 NOTE — Progress Notes (Deleted)
 Reviewed and agree with Dr Macky Lower plan.

## 2024-06-13 ENCOUNTER — Telehealth: Payer: Self-pay

## 2024-06-13 ENCOUNTER — Telehealth: Payer: Self-pay | Admitting: Cardiovascular Disease

## 2024-06-13 DIAGNOSIS — E875 Hyperkalemia: Secondary | ICD-10-CM | POA: Diagnosis not present

## 2024-06-13 LAB — LIPID PANEL
Chol/HDL Ratio: 2.1 ratio (ref 0.0–5.0)
Cholesterol, Total: 136 mg/dL (ref 100–199)
HDL: 66 mg/dL (ref >39)
LDL Chol Calc (NIH): 52 mg/dL (ref 0–99)
Triglycerides: 100 mg/dL (ref 0–149)
VLDL Cholesterol Cal: 18 mg/dL (ref 5–40)

## 2024-06-13 MED ORDER — HYDROCODONE-ACETAMINOPHEN 10-325 MG PO TABS: 1.0000 | ORAL_TABLET | Freq: Two times a day (BID) | ORAL | 0 refills | Status: DC | PRN

## 2024-06-13 NOTE — Telephone Encounter (Signed)
 Patient LVM on nurse line reporting difficulty picking up [employer].  I called the pharmacy. They reports Sensors are ready for pick up.  Called patient to inform, however I had to leave a detailed VM.

## 2024-06-13 NOTE — Telephone Encounter (Signed)
 Pharmacy unable to fill patient's hydrocodone -APAP 10/325 tablets today. Pharmacy will be closed for 7/4 holiday. Patient will not be in town after 7/4 to pick up prescription on 7/5.  New prescription for hydrocodone -APAP 10/325 tablets, disp: #60, RF 0 sent to patient's pharamacy requesting refill of prescription today for reasons stated above.   Asked pharmacy to cancel the first hydrocodone /APAP 10/325 prescription sent today.

## 2024-06-13 NOTE — Telephone Encounter (Signed)
 Pharmacy Patient Advocate Encounter  Received notification from [facility] ADVANTAGE/RX ADVANCE that Prior Authorization for repatha  has been APPROVED from 06/12/24 to 12/13/24   PA #/Case ID/Reference #: [id number]    Sent mcyhart -patient has [employer] grant

## 2024-06-13 NOTE — Addendum Note (Signed)
 Addended byBETHA BALE, Walt Geathers D on: 06/13/2024 11:09 AM   Modules accepted: Orders

## 2024-06-13 NOTE — Telephone Encounter (Signed)
 Wife is calling to speak to you. States that it is very important. Please advise

## 2024-06-13 NOTE — Telephone Encounter (Signed)
 Returned the call to the patient. He asked that we call his wife.   Call placed to his wife. She was inquiring about why [facility] drew the patient's lipid panel when he needed the STAT BMET. An apology was given for the inconvenience. She expressed her understanding.

## 2024-06-14 LAB — BASIC METABOLIC PANEL WITH GFR
BUN/Creatinine Ratio: 19 (ref 10–24)
BUN: 42 mg/dL — ABNORMAL HIGH (ref 8–27)
CO2: 18 mmol/L — ABNORMAL LOW (ref 20–29)
Calcium: 9.1 mg/dL (ref 8.6–10.2)
Chloride: 106 mmol/L (ref 96–106)
Creatinine, Ser: 2.21 mg/dL — ABNORMAL HIGH (ref 0.76–1.27)
Glucose: 303 mg/dL — ABNORMAL HIGH (ref 70–99)
Potassium: 5.2 mmol/L (ref 3.5–5.2)
Sodium: 138 mmol/L (ref 134–144)
eGFR: 31 mL/min/1.73 — ABNORMAL LOW (ref >59)

## 2024-07-04 DIAGNOSIS — K219 Gastro-esophageal reflux disease without esophagitis: Secondary | ICD-10-CM | POA: Diagnosis not present

## 2024-07-04 DIAGNOSIS — G8929 Other chronic pain: Secondary | ICD-10-CM | POA: Diagnosis not present

## 2024-07-04 DIAGNOSIS — E1122 Type 2 diabetes mellitus with diabetic chronic kidney disease: Secondary | ICD-10-CM | POA: Diagnosis not present

## 2024-07-04 DIAGNOSIS — I2510 Atherosclerotic heart disease of native coronary artery without angina pectoris: Secondary | ICD-10-CM | POA: Diagnosis not present

## 2024-07-04 DIAGNOSIS — I4891 Unspecified atrial fibrillation: Secondary | ICD-10-CM | POA: Diagnosis not present

## 2024-07-04 DIAGNOSIS — H259 Unspecified age-related cataract: Secondary | ICD-10-CM | POA: Diagnosis not present

## 2024-07-04 DIAGNOSIS — N1832 Chronic kidney disease, stage 3b: Secondary | ICD-10-CM | POA: Diagnosis not present

## 2024-07-04 DIAGNOSIS — F17210 Nicotine dependence, cigarettes, uncomplicated: Secondary | ICD-10-CM | POA: Diagnosis not present

## 2024-07-04 DIAGNOSIS — I129 Hypertensive chronic kidney disease with stage 1 through stage 4 chronic kidney disease, or unspecified chronic kidney disease: Secondary | ICD-10-CM | POA: Diagnosis not present

## 2024-07-04 DIAGNOSIS — D6869 Other thrombophilia: Secondary | ICD-10-CM | POA: Diagnosis not present

## 2024-07-04 DIAGNOSIS — E785 Hyperlipidemia, unspecified: Secondary | ICD-10-CM | POA: Diagnosis not present

## 2024-07-04 DIAGNOSIS — Z794 Long term (current) use of insulin: Secondary | ICD-10-CM | POA: Diagnosis not present

## 2024-07-08 ENCOUNTER — Other Ambulatory Visit: Payer: Self-pay

## 2024-07-08 DIAGNOSIS — G8929 Other chronic pain: Secondary | ICD-10-CM

## 2024-07-08 DIAGNOSIS — M48062 Spinal stenosis, lumbar region with neurogenic claudication: Secondary | ICD-10-CM

## 2024-07-08 DIAGNOSIS — G629 Polyneuropathy, unspecified: Secondary | ICD-10-CM

## 2024-07-08 DIAGNOSIS — E1151 Type 2 diabetes mellitus with diabetic peripheral angiopathy without gangrene: Secondary | ICD-10-CM

## 2024-07-08 MED ORDER — FREESTYLE LIBRE 2 SENSOR MISC: 1 refills | Status: DC

## 2024-07-08 MED ORDER — HYDROCODONE-ACETAMINOPHEN 10-325 MG PO TABS: 1.0000 | ORAL_TABLET | Freq: Two times a day (BID) | ORAL | 0 refills | Status: DC | PRN

## 2024-07-08 NOTE — Telephone Encounter (Signed)
 Patient's wife calls nurse line regarding regarding Libre sensors.   She is asking if prescription can be updated for 3 month supply so that he is not having to go to the pharmacy every other week.   Pended libre sensors for a 3 month supply.   Katheryn is also wanting to go ahead and request refill for pain medication.   Forwarding to PCP.   Chiquita JAYSON English, RN

## 2024-07-24 ENCOUNTER — Other Ambulatory Visit (HOSPITAL_COMMUNITY): Payer: Self-pay

## 2024-07-24 ENCOUNTER — Telehealth: Payer: Self-pay | Admitting: Pharmacist

## 2024-07-24 MED ORDER — FREESTYLE LIBRE 3 READER DEVI
1.0000 | Freq: Once | 0 refills | Status: AC
Start: 2024-07-24 — End: 2024-07-24

## 2024-07-24 MED ORDER — FREESTYLE LIBRE 3 PLUS SENSOR MISC: 3 refills | Status: DC

## 2024-07-24 NOTE — Telephone Encounter (Signed)
 Reviewed and agree with Dr Rennis plan.

## 2024-07-24 NOTE — Telephone Encounter (Signed)
 Patient contacted for follow-up of Libre 2 use and switch to Libre2+ sensors. Reports using Libre CGM with reader.   Since last contact patient reports doing well. Reports smoking 0.5 ppd.  Current Medications include: metformin  500 mg 2 tabs BIDac, Basaglar  (insulin  glargine) 10-12 units daily, Humalog  (insuline lispro) 3-5 units TIDac Patient denies any significant medication related side effects.  Medication Plan: -Sent in prescription for Libre2+ to [facility]. Plan to reevaluate switch to Libre3+ if insurance allows. -Scheduled follow up with Dr. McDiarmid for 08/22/24 at 9:30 am. -Counseled to continue reducing daily cigarette intake.  Total time with patient call and documentation of interaction: 9 minutes.

## 2024-07-24 NOTE — Telephone Encounter (Signed)
 Patient contacted for follow-up of Libre CGM. Libre3 reader covered by insurance, plan to switch patient over to [place] reader and Libre3+ sensors. Prescriptions sent into [facility].  Total time with patient call and documentation of interaction: 3 minutes.

## 2024-07-26 NOTE — Telephone Encounter (Signed)
 Reviewed and agree.

## 2024-07-30 ENCOUNTER — Other Ambulatory Visit: Payer: Self-pay

## 2024-07-30 DIAGNOSIS — E1151 Type 2 diabetes mellitus with diabetic peripheral angiopathy without gangrene: Secondary | ICD-10-CM

## 2024-07-30 DIAGNOSIS — E11319 Type 2 diabetes mellitus with unspecified diabetic retinopathy without macular edema: Secondary | ICD-10-CM

## 2024-07-31 MED ORDER — INSULIN LISPRO 100 UNIT/ML IJ SOLN: 3.0000 [IU] | Freq: Three times a day (TID) | INTRAMUSCULAR | 1 refills | Status: DC

## 2024-08-01 ENCOUNTER — Other Ambulatory Visit: Payer: Self-pay

## 2024-08-01 DIAGNOSIS — E11319 Type 2 diabetes mellitus with unspecified diabetic retinopathy without macular edema: Secondary | ICD-10-CM

## 2024-08-01 DIAGNOSIS — E1151 Type 2 diabetes mellitus with diabetic peripheral angiopathy without gangrene: Secondary | ICD-10-CM

## 2024-08-02 MED ORDER — INSULIN LISPRO 100 UNIT/ML IJ SOLN: 3.0000 [IU] | Freq: Three times a day (TID) | INTRAMUSCULAR | 1 refills | Status: DC

## 2024-08-07 ENCOUNTER — Telehealth: Payer: Self-pay

## 2024-08-07 DIAGNOSIS — E11319 Type 2 diabetes mellitus with unspecified diabetic retinopathy without macular edema: Secondary | ICD-10-CM

## 2024-08-07 DIAGNOSIS — E1151 Type 2 diabetes mellitus with diabetic peripheral angiopathy without gangrene: Secondary | ICD-10-CM

## 2024-08-07 NOTE — Telephone Encounter (Signed)
 Received a refill request fax from [employer] (from [employer] patient assistance company) for patients HUMALOG  medication.   Please send a 90 day supply (with refills) to [facility] if applicable. Thanks!

## 2024-08-08 MED ORDER — INSULIN LISPRO 100 UNIT/ML IJ SOLN: 3.0000 [IU] | Freq: Three times a day (TID) | INTRAMUSCULAR | 5 refills | Status: DC

## 2024-08-08 NOTE — Telephone Encounter (Signed)
 Reviewed and agree with Dr Rennis plan.

## 2024-08-08 NOTE — Telephone Encounter (Signed)
 Asked to update insulin  order to preferred insulin  glargine.   New prescription provided. 2 pens provided for more than 1 month supply.

## 2024-08-09 NOTE — Telephone Encounter (Signed)
 Reviewed and agree with Dr Rennis plan.

## 2024-08-19 ENCOUNTER — Other Ambulatory Visit: Payer: Self-pay | Admitting: Family Medicine

## 2024-08-19 DIAGNOSIS — E1165 Type 2 diabetes mellitus with hyperglycemia: Secondary | ICD-10-CM

## 2024-08-19 DIAGNOSIS — E1151 Type 2 diabetes mellitus with diabetic peripheral angiopathy without gangrene: Secondary | ICD-10-CM

## 2024-08-22 ENCOUNTER — Ambulatory Visit: Admitting: Family Medicine

## 2024-08-22 ENCOUNTER — Encounter: Payer: Self-pay | Admitting: Family Medicine

## 2024-08-22 VITALS — BP 160/68 | HR 67 | Ht 71.0 in | Wt 141.0 lb

## 2024-08-22 DIAGNOSIS — I2510 Atherosclerotic heart disease of native coronary artery without angina pectoris: Secondary | ICD-10-CM | POA: Diagnosis not present

## 2024-08-22 DIAGNOSIS — Z23 Encounter for immunization: Secondary | ICD-10-CM

## 2024-08-22 DIAGNOSIS — E1159 Type 2 diabetes mellitus with other circulatory complications: Secondary | ICD-10-CM | POA: Diagnosis not present

## 2024-08-22 DIAGNOSIS — E1151 Type 2 diabetes mellitus with diabetic peripheral angiopathy without gangrene: Secondary | ICD-10-CM

## 2024-08-22 DIAGNOSIS — F17210 Nicotine dependence, cigarettes, uncomplicated: Secondary | ICD-10-CM

## 2024-08-22 DIAGNOSIS — I152 Hypertension secondary to endocrine disorders: Secondary | ICD-10-CM | POA: Diagnosis not present

## 2024-08-22 DIAGNOSIS — Z0000 Encounter for general adult medical examination without abnormal findings: Secondary | ICD-10-CM | POA: Diagnosis not present

## 2024-08-22 DIAGNOSIS — Z794 Long term (current) use of insulin: Secondary | ICD-10-CM | POA: Diagnosis not present

## 2024-08-22 LAB — POCT GLYCOSYLATED HEMOGLOBIN (HGB A1C): HbA1c, POC (controlled diabetic range): 9.5 % — AB (ref 0.0–7.0)

## 2024-08-22 MED ORDER — COVID-19 MRNA VAC-TRIS(PFIZER) 30 MCG/0.3ML IM SUSY: 0.3000 mL | PREFILLED_SYRINGE | Freq: Once | INTRAMUSCULAR | 0 refills | Status: AC

## 2024-08-22 MED ORDER — HYDRALAZINE HCL 50 MG PO TABS: 50.0000 mg | ORAL_TABLET | Freq: Three times a day (TID) | ORAL | 1 refills | Status: DC

## 2024-08-22 NOTE — Patient Instructions (Addendum)
 Your blood pressure is still higher than we recommend to help keep your kidneys working well.   When you finish the last Diltiazem  120 mg capsules, start the next higher dose of Diltiazem  180 mg capsule, one capsule a day.    Your A1c is 9.5% which is better than it was before but still not at the goal of less than 8.0%.  Since your CGM readings seem to indicate recent good blood sugar control, perhaps this means that you are now in better control starting recently.   Stay with your current insulin  therapy doses.   I would be okay to stop the tumsulosin (Flomax ) for a few days to a week.  See if you start having trouble peeing, like taking a long time to start, taking a long time to empty, dribbing for a long teim afterwards, feeling like you are not emptying your bladder completely, and getting up at night more to go pee.    If you start to have these problem, just restart your Flomax .

## 2024-08-23 LAB — MICROALBUMIN / CREATININE URINE RATIO
Creatinine, Urine: 39.5 mg/dL
Microalb/Creat Ratio: 4873 mg/g{creat} — ABNORMAL HIGH (ref 0–29)
Microalbumin, Urine: 1924.8 ug/mL

## 2024-08-23 NOTE — Assessment & Plan Note (Signed)
 Established problem that has improved and has not meet goal of A1c < 8.0%.  CGM report  last 2 weeks showing in range > 70%, < 70 4%, above 180 25%, above 250 5%. Low primarily in mornings Average capillary blood glucose 189 with 40% variability A1c predicated 7.9%  Continue basaglar  ~ 10 units qam (based on morning capillary blood glucose) and 3-5 units with bfst or evening meal (may not take or may adjust dose based on capillary blood glucose)  [place] metformin  500 mg bid.  May need to stopp if GFR continues to decline.

## 2024-08-23 NOTE — Assessment & Plan Note (Signed)
 Declined LD Lung CT - Colin Keller says he does not want to know if he has cancer.

## 2024-08-23 NOTE — Progress Notes (Signed)
 Colin Keller is alone Sources of clinical information for visit is/are patient. Nursing assessment for this office visit was reviewed with the patient for accuracy and revision.   Previous Report(s) Reviewed: none   Colin Keller's case was discussed with Colin Keller, PharmD    08/22/2024    9:36 AM  Depression screen PHQ 2/9  Decreased Interest 0  Down, Depressed, Hopeless 0  PHQ - 2 Score 0  Altered sleeping 0  Tired, decreased energy 0  Change in appetite 0  Feeling bad or failure about yourself  0  Trouble concentrating 0  Moving slowly or fidgety/restless 0  Suicidal thoughts 0  PHQ-9 Score 0   Flowsheet Row Office Visit from 08/22/2024 in [facility] - A Dept Of [place]. [facility] Office Visit from 02/20/2024 in [facility] - A Dept Of [place]. [facility] Office Visit from 11/30/2023 in [facility] - [facility] Dept Of [place]. [facility]  Thoughts that you would be better off dead, or of hurting yourself in some way Not at all Not at all Not at all  PHQ-9 Total Score 0 4 7       08/22/2024    9:36 AM 02/20/2024   10:08 AM 11/30/2023   11:11 AM 10/26/2023   10:55 AM 08/10/2023   10:58 AM  Fall Risk   Falls in the past year? 0 0 0 1 0  Number falls in past yr: 0 0 0 0 0  Injury with Fall? 0 0 0 1 0       08/22/2024    9:36 AM 02/20/2024   10:08 AM 11/30/2023   11:11 AM  PHQ9 SCORE ONLY  PHQ-9 Total Score 0 4  7      Data saved with a previous flowsheet row definition    There are no preventive care reminders to display for this patient.  Health Maintenance Due  Topic Date Due   Zoster Vaccines- Shingrix (1 of 2) Never done   Colonoscopy  Never done   Lung Cancer Screening  Never done   Diabetic kidney evaluation - Urine ACR  05/13/2023   Medicare Annual Wellness (AWV)  08/06/2024      History/P.E. limitations: none  There are no preventive care reminders to display for this patient.   Diabetes Health Maintenance Due  Topic Date Due   OPHTHALMOLOGY EXAM  12/13/2024 (Originally 06/21/2018)   HEMOGLOBIN A1C  02/19/2025    Health Maintenance Due  Topic Date Due   Zoster Vaccines- Shingrix (1 of 2) Never done   Colonoscopy  Never done   Lung Cancer Screening  Never done   Diabetic kidney evaluation - Urine ACR  05/13/2023   Medicare Annual Wellness (AWV)  08/06/2024     Chief Complaint  Patient presents with   Diabetes     Discussed the use of AI scribe software for clinical note transcription with the patient, who gave verbal consent to proceed.  History of Present Illness      SDOH Screenings   Food Insecurity: No Food Insecurity (10/06/2023)  Housing: Low Risk  (10/03/2023)  Transportation Needs: No Transportation Needs (10/06/2023)  Utilities: Not At Risk (10/03/2023)  Alcohol Screen: Low Risk  (08/07/2023)  Depression (PHQ2-9): Low Risk  (08/22/2024)  Financial Resource Strain: Low Risk  (08/07/2023)  Physical Activity: Sufficiently Active (08/07/2023)  Social Connections: Moderately Isolated (08/07/2023)  Stress: No Stress Concern Present (08/07/2023)  Tobacco Use: High Risk (08/22/2024)  Health Literacy: Adequate Health Literacy (08/07/2023)   --------------------------------------------------------------------------------------------------------------------------------------------- Visit Problem List with Assessment and Plan   Assessment and Plan Assessment & Plan      Hypertension associated with diabetes (HCC) Established problem Uncontrolled.  Patient is not at goal of SBP < 140. Increase Hydralazine  to 50 mg three times a day from BID  Continue: Carvedilol  25 mg twice a day, Diltiazem  24 hr 120 mg daily,  May increase to Diltiazem  24h 180 mg daily in future   DM (diabetes mellitus), type 2 with peripheral vascular complications (HCC) Established problem that has improved and has not meet goal of A1c < 8.0%.  CGM report  last 2 weeks  showing in range > 70%, < 70 4%, above 180 25%, above 250 5%. Low primarily in mornings Average capillary blood glucose 189 with 40% variability A1c predicated 7.9%  Continue basaglar  ~ 10 units qam (based on morning capillary blood glucose) and 3-5 units with bfst or evening meal (may not take or may adjust dose based on capillary blood glucose)  [place] metformin  500 mg bid.  May need to stopp if GFR continues to decline.    Healthcare maintenance Declined LD Lung CT - Colin Keller says he does not want to know if he has cancer.

## 2024-08-23 NOTE — Assessment & Plan Note (Signed)
 Established problem Uncontrolled.  Patient is not at goal of SBP < 140. Increase Hydralazine  to 50 mg three times a day from BID  Continue: Carvedilol  25 mg twice a day, Diltiazem  24 hr 120 mg daily,  May increase to Diltiazem  24h 180 mg daily in future

## 2024-09-03 ENCOUNTER — Ambulatory Visit: Attending: Cardiovascular Disease | Admitting: Cardiovascular Disease

## 2024-09-03 NOTE — Progress Notes (Deleted)
 Cardiology Office Note   Date:  09/03/2024   ID:  Colin Keller, DOB [birth year], MRN [id number]  PCP:  McDiarmid, Krystal BIRCH, MD  Cardiologist: Dr. Pietro  No chief complaint on file.      History of Present Illness: Colin Keller is a 68 y.o. male who is here today for follow-up visit regarding peripheral arterial disease.  He has known history of coronary artery disease with previous non-ST elevation myocardial infarction in 2019.  Cardiac catheterization showed severe three-vessel coronary artery disease and he was treated with CABG.  He has chronic medical conditions include hyperlipidemia with intolerance to statins, chronic kidney disease, type 2 diabetes and carotid artery disease. Previous screening for abdominal aortic aneurysm in 2020 was negative.  Moderately elevated velocities were noted in the iliac arteries. He has been followed for peripheral arterial disease with bilateral SFA and popliteal disease.  He had worsening symptoms in 2023 with progression to rest pain on the left side.  Repeat Doppler studies showed a drop in ABI to 0.39 on the left. Angiography in October 2023 showed no significant aortoiliac disease.  On the left side, there was severely calcified disease affecting the proximal as well as distal SFA with three-vessel runoff below the knee.  There was short occlusion of the distal SFA.  I performed successful orbital atherectomy and drug-coated balloon angioplasty to the left SFA with excellent results.  He was hospitalized in October, 2024  with hyperglycemia and ketoacidosis in the setting of COVID-19 infection.  Jardiance  was discontinued.  He had acute on chronic renal disease that improved with hydration.  He was found to have mildly elevated troponin which was felt to be due to supply demand ischemia.  Most lower extremity arterial Doppler study showed an ABI of 0.75 on the right and 0.90 on the left.  Duplex on the left showed severe focal stenosis in the  mid SFA with peak velocity of almost 500.  He reports stable bilateral leg claudication which is worse on the right than the left.  He ran out of hydralazine .  He continues to smoke 6 cigarettes a day.     Past Medical History:  Diagnosis Date   Acute diastolic heart failure (HCC) 08/18/2018   AKI (acute kidney injury) 10/04/2023   Atherosclerosis of both carotid arteries 08/18/2020   Carotid dopplers 08/2019 40-59% bilateral stenosis and right subclavian stenosis   Bilateral carotid artery stenosis 08/18/2020   Right Carotid: Velocities in the right ICA are consistent with a 60-79%                 stenosis. Non-hemodynamically significant plaque <50% noted in                 the CCA. The ECA appears >50% stenosed. Proximal subclavian                 artery dilatation with elevated and turbulent flow just past the                 narrowing, measuring 1.6 cm.   Left Carotid: Velocities in the left ICA are    CHF (congestive heart failure) (HCC)    Chronic congestive heart failure (HCC) 10/04/2023   Transthoracic echocardiogram 10/08/2018: LVEF is approximately 50% with hypokinesis of the lateral (base/mid) and basal inferior walls T     Chronic left shoulder pain 03/18/2021   Chronic low back pain without sciatica 10/16/2008   H/o chronic LBP with h/o lumbar  disc herniation and intermittent radicular pain  Chronic pain, on stable doses of Norco 10/325 TID. MRI in 2003 showing SMALL CENTRAL DISC HERNIATION AT L4-5.  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      CKD (chronic kidney disease), stage III (HCC)    Coronary artery disease    Coronary artery disease involving native heart with angina pectoris, unspecified vessel or lesion type 08/10/2023   COVID 10/04/2023   Diabetes mellitus    DKA (diabetic ketoacidosis) (HCC) 10/03/2023   Dyspnea    Fall 10/04/2023   GERD (gastroesophageal reflux disease)    Hx of CABG 08/23/2018   LIMA to LAD, RIMA to RCA, SVG to OM3,  EVH via right thigh   Hyperlipidemia    Hypertension    Hypertension associated with diabetes (HCC) 12/22/2008   Qualifier: Diagnosis of  By: Edrick MD, Susan     Hypertensive nephropathy 08/18/2020   Insulin  dependent type 2 diabetes mellitus (HCC) 12/22/2020   Left ventricular dysfunction 08/18/2020   TTE ([id number]): LVEF is approximately 50% with hypokinesis of the lateral (base/mid) and basal inferior walls The cavity size was normal.      Long term (current) use of antithrombotics/antiplatelets 09/21/2022   Planned 49-months DAPT for DES for PAD on 09/21/2022.  Planned 6 months therapy. End 03/23/23.   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   LIMA to LAD, RIMA to RCA, SVG to OM3, EVH via right thigh   Sleeping difficulties 09/08/2019   Subclavian artery stenosis, right 08/18/2020   Carotid dopplers 08/2019 40-59% bilateral stenosis and right subclavian stenosis   Tobacco abuse    Tobacco abuse disorder    Qualifier: Diagnosis of  By: Edrick MD, Devere      Past Surgical History:  Procedure Laterality Date   ABDOMINAL AORTOGRAM W/LOWER EXTREMITY N/A 09/21/2022   Procedure: ABDOMINAL AORTOGRAM W/LOWER EXTREMITY;  Surgeon: Darron Deatrice LABOR, MD;  Location: [facility] INVASIVE CV LAB;  Service: Cardiovascular;  Laterality: N/A;   CORONARY ARTERY BYPASS GRAFT N/A 08/23/2018   Procedure: CORONARY ARTERY BYPASS GRAFTING (CABG) x 3; -Left Internal Mammary Artery to Left Anterior Descending Artery, -Right Internal Mammary Artery to Right Coronary  Artery, -Saphenous Vein Graft to Obtuse Marginal;  ENDOSCOPIC HARVEST GREATER SAPHENOUS VEIN  -Right Thigh;  Surgeon: Dusty Sudie DEL, MD;  Location: [facility] OR;  Service: Open Heart Surgery;  Laterality: N/A;   CORONARY PRESSURE/FFR STUDY N/A 08/20/2018   Procedure: INTRAVASCULAR PRESSURE WIRE/FFR STUDY;  Surgeon: Wonda Sharper, MD;  Location: [facility] INVASIVE CV LAB;  Service: Cardiovascular;  Laterality: N/A;   LEFT HEART CATH AND CORONARY ANGIOGRAPHY N/A 08/20/2018   Procedure: LEFT HEART CATH AND CORONARY ANGIOGRAPHY;  Surgeon: Wonda Sharper, MD;  Location: [facility] INVASIVE CV LAB;  Service: Cardiovascular;  Laterality: N/A;  PERIPHERAL VASCULAR ATHERECTOMY Left 09/21/2022   Procedure: PERIPHERAL VASCULAR ATHERECTOMY;  Surgeon: Darron Deatrice LABOR, MD;  Location: [facility] INVASIVE CV LAB;  Service: Cardiovascular;  Laterality: Left;  SFA   PERIPHERAL VASCULAR BALLOON ANGIOPLASTY Left 09/21/2022   Procedure: PERIPHERAL VASCULAR BALLOON ANGIOPLASTY;  Surgeon: Darron Deatrice LABOR, MD;  Location: [facility] INVASIVE CV LAB;  Service: Cardiovascular;  Laterality: Left;  SFA   TEE WITHOUT CARDIOVERSION N/A 08/23/2018   Procedure: TRANSESOPHAGEAL ECHOCARDIOGRAM (TEE);  Surgeon: Dusty Sudie DEL, MD;  Location: [facility] OR;  Service: Open Heart Surgery;  Laterality: N/A;     Current Outpatient Medications  Medication Sig Dispense Refill   aspirin  81 MG tablet Take 81 mg by mouth daily.     carvedilol  (COREG ) 25 MG tablet TAKE 1 TABLET BY MOUTH TWICE DAILY WITH A MEAL 180 tablet 3   Continuous Glucose Sensor (FREESTYLE LIBRE 3 PLUS SENSOR) MISC Change sensor every 15 days. 6 each 3   Cyanocobalamin (VITAMIN B 12 PO) Take 1 tablet by mouth daily.     diltiazem  (CARTIA  XT) 120 MG 24 hr capsule TAKE 1 CAPSULE BY MOUTH DAILY 90 capsule 3   Evolocumab  (REPATHA  SURECLICK) 140 MG/ML SOAJ Inject 140 mg into the skin every 14 (fourteen) days. 6 mL 3   famotidine  (PEPCID ) 20 MG tablet Take 20 mg by mouth daily.     hydrALAZINE  (APRESOLINE ) 50  MG tablet Take 1 tablet (50 mg total) by mouth 3 (three) times daily. 180 tablet 1   HYDROcodone -acetaminophen  (NORCO) 10-325 MG tablet Take 1 tablet by mouth 2 (two) times daily as needed. 60 tablet 0   Insulin  Glargine (BASAGLAR  KWIKPEN) 100 UNIT/ML DIAL AND INJECT 20 UNITS UNDER THE SKIN DAILY. (Patient taking differently: 10 Units in the morning.) 18 mL 5   insulin  lispro (HUMALOG ) 100 UNIT/ML injection Inject 0.03-0.05 mLs (3-5 Units total) into the skin 3 (three) times daily with meals. 3-4 units prior to breakfast, 4-5 units prior to evening meal (Patient taking differently: Inject 3-5 Units into the skin 2 (two) times daily. 3-4 units prior to breakfast, 4-5 units prior to evening meal BASED ON cgm READING) 6 mL 5   metFORMIN  (GLUCOPHAGE ) 500 MG tablet Take 2 tablets (1,000 mg total) by mouth 2 (two) times daily with a meal. TAKE 2 TABLETS BY MOUTH TWICE DAILY WITH A MEAL 360 tablet 3   tamsulosin  (FLOMAX ) 0.4 MG CAPS capsule Take 1 capsule (0.4 mg total) by mouth daily. 90 capsule 3   No current facility-administered medications for this visit.    Allergies:   Amlodipine , Empagliflozin , Amitriptyline , Lisinopril , Nortriptyline  hcl, Statins, and Simvastatin     Social History:  The patient  reports that he has been smoking cigarettes. He started smoking about 54 years ago. He has a 48 pack-year smoking history. He has never used smokeless tobacco. He reports that he does not drink alcohol and does not use drugs.   Family History:  The patient's family history includes Diabetes in his mother; Drug abuse in his son; Heart disease in his father and mother; Heart failure in his mother; Sudden death in his brother.    ROS:  Please see the history of present illness.   Otherwise, review of systems are positive for none.   All other systems are reviewed and negative.    PHYSICAL EXAM: VS:  There were no vitals taken for this visit. , BMI There is no height or weight on file to calculate  BMI. GEN: Well nourished, well developed, in no  acute distress  HEENT: normal  Neck: no JVD or masses.  Bilateral carotid bruits Cardiac: RRR; no murmurs, rubs, or gallops,no edema  Respiratory:  clear to auscultation bilaterally, normal work of breathing GI: soft, nontender, nondistended, + BS MS: no deformity or atrophy  Skin: warm and dry, no rash Neuro:  Strength and sensation are intact Psych: euthymic mood, full affect   EKG:  EKG is  ordered today. EKG showed sinus bradycardia.    Recent Labs: 10/03/2023: B Natriuretic Peptide 450.8 11/24/2023: Hemoglobin 10.1; Platelets 248 06/11/2024: ALT 8 06/13/2024: BUN 42; Creatinine, Ser 2.21; Potassium 5.2; Sodium 138    Lipid Panel    Component Value Date/Time   CHOL 136 06/12/2024 0912   TRIG 100 06/12/2024 0912   HDL 66 06/12/2024 0912   CHOLHDL 2.1 06/12/2024 0912   CHOLHDL 2.0 09/22/2022 0055   VLDL 17 09/22/2022 0055   LDLCALC 52 06/12/2024 0912   LDLDIRECT 105 09/19/2016 0922      Wt Readings from Last 3 Encounters:  08/22/24 141 lb (64 kg)  06/11/24 137 lb 3.2 oz (62.2 kg)  03/20/24 148 lb 9.6 oz (67.4 kg)          No data to display            ASSESSMENT AND PLAN:  1.  Peripheral arterial disease: Stat is post atherectomy and drug-coated balloon angioplasty to the left SFA in 2023. He has focal restenosis in the left mid SFA.  However, his symptoms are unchanged and if anything he reports improvement.  He is known to have significant right SFA disease as well.  Continue medical therapy for now and I asked him to let me know if his symptoms worsen.  2.  Coronary artery disease involving native coronary arteries without angina: He seems to be doing well from a cardiac standpoint.  3.  Hyperlipidemia: He is intolerant to statin but doing well with Repatha .  I requested follow-up lipid and liver profile.  4.  Essential hypertension: His blood pressure is elevated but he ran out of hydralazine  which was  refilled today.  He does have underlying chronic kidney disease and will check renal function.  5.  Carotid artery disease: Recent carotid Doppler showed stable 60 to 79% right carotid stenosis and 40 to 59% left carotid stenosis.  Repeat studies in 1 year.  6.  Stage III chronic kidney disease: Seems to be stable.  7.  Tobacco use: He cut down to few cigarettes a day but has not been able to quit completely.  I discussed the importance of smoking cessation.    Disposition:   Follow-up in 6 months.  Signed,  Deatrice Cage, MD  09/03/2024 9:24 AM    [facility]

## 2024-09-04 ENCOUNTER — Encounter: Payer: Self-pay | Admitting: Cardiovascular Disease

## 2024-09-09 ENCOUNTER — Other Ambulatory Visit: Payer: Self-pay | Admitting: Family Medicine

## 2024-09-09 DIAGNOSIS — M48062 Spinal stenosis, lumbar region with neurogenic claudication: Secondary | ICD-10-CM

## 2024-09-09 DIAGNOSIS — G629 Polyneuropathy, unspecified: Secondary | ICD-10-CM

## 2024-09-09 DIAGNOSIS — G8929 Other chronic pain: Secondary | ICD-10-CM

## 2024-09-09 DIAGNOSIS — M545 Low back pain, unspecified: Secondary | ICD-10-CM

## 2024-09-19 ENCOUNTER — Telehealth: Payer: Self-pay | Admitting: Pharmacist

## 2024-09-19 NOTE — Telephone Encounter (Signed)
 Patient contacts office to request visit for transition from [place] 2 reader/sensors to the [place] 3 READER and Libre3+ sensors.  Returned to call and scheduled follow-up for CGM sensor exchanged on 10/14  Total time with patient call and documentation of interaction: 9 minutes.

## 2024-09-20 NOTE — Telephone Encounter (Signed)
 Reviewed and agree with Dr Rennis plan.

## 2024-09-23 ENCOUNTER — Telehealth: Payer: Self-pay

## 2024-09-23 NOTE — Telephone Encounter (Signed)
 Patient contacted for follow-up of GI symptoms.   Since last contact patient reports he has been able to keep liquid down and is now trying to eat some chicken noodle soup.   He has NOT taken any insulin  yet today and reports CGM readings> 300  Medication Plan: - Reduce Basaglar  (insulin  glargine) by half - take 10 units today in place of usual 20 units - Continue with usual prandial doses if solid food consumed.   - Discussed stress of illness may require higher dose than anticipated for smaller food intake.   Patient may reschedule previously planned in-person [facility] visit with me for 10/14 (tomorrow).  Total time with patient call and documentation of interaction: 12 minutes.

## 2024-09-23 NOTE — Telephone Encounter (Signed)
 Patient calls nurse line regarding sick symptoms. He reports several episodes of diarrhea and vomiting last night. Last episode of vomiting was around 3 am this morning.   He denies fever, reports chills.   Wife has administered imodium and phenergan  to help with symptoms. Patient has been tolerating fluids, sipping soda and water.   He has not had much appetite. Reports that blood sugar levels have been in the 200's for the last 24 hours. Encouraged to continue pushing fluids and advance diet as tolerated.   Patient has follow up visit with Dr. Koval tomorrow morning. We also scheduled OV in ATC at 9:30 after visit with Dr. Koval.   Patient is asking if he should make any adjustments with his medications in the meantime.   Will forward to Dr. Koval and PCP.   ED precautions discussed.   Chiquita JAYSON English, RN

## 2024-09-24 ENCOUNTER — Ambulatory Visit: Admitting: Pharmacist

## 2024-09-24 ENCOUNTER — Ambulatory Visit (INDEPENDENT_AMBULATORY_CARE_PROVIDER_SITE_OTHER): Admitting: Family Medicine

## 2024-09-24 ENCOUNTER — Encounter: Payer: Self-pay | Admitting: Pharmacist

## 2024-09-24 VITALS — BP 183/85 | HR 72 | Ht 71.0 in

## 2024-09-24 VITALS — BP 193/81 | HR 73 | Wt 141.6 lb

## 2024-09-24 DIAGNOSIS — E1159 Type 2 diabetes mellitus with other circulatory complications: Secondary | ICD-10-CM

## 2024-09-24 DIAGNOSIS — I152 Hypertension secondary to endocrine disorders: Secondary | ICD-10-CM | POA: Diagnosis not present

## 2024-09-24 DIAGNOSIS — E1151 Type 2 diabetes mellitus with diabetic peripheral angiopathy without gangrene: Secondary | ICD-10-CM

## 2024-09-24 DIAGNOSIS — K529 Noninfective gastroenteritis and colitis, unspecified: Secondary | ICD-10-CM | POA: Diagnosis not present

## 2024-09-24 MED ORDER — PROMETHAZINE HCL 25 MG PO TABS: 25.0000 mg | ORAL_TABLET | Freq: Four times a day (QID) | ORAL | 0 refills | Status: DC | PRN

## 2024-09-24 NOTE — Assessment & Plan Note (Signed)
 Diabetes longstanding since 2010 currently controlled. Patient is able to verbalize appropriate hypoglycemia management plan. Medication adherence appears good. Patient reports that he has been sick and not taking his medications for the past few days due to throwing up. Long acting insulin  was adjusted to accommodate for lack of oral intake to 10 units.  -Increased basal insulin  Basaglar  (insulin  glargine) back to 20 units daily as food intake improves -Continued rapid insulin  Humalog  (insulin  lispro) at 3-5 units with meals as food intake improves -Continued metformin  500 mg 2 tab twice daily.  -Counseled on eating a mid afternoon snack between 3-4 pm if blood sugar is less than 150.  - Switched from [place] 2 sensor and reader to [place] 3 sensor. Reader provided.  -Patient educated on purpose, proper use, and potential adverse effects .  -Extensively discussed pathophysiology of diabetes, recommended lifestyle interventions, dietary effects on blood sugar control.  -Counseled on s/sx of and management of hypoglycemia.

## 2024-09-24 NOTE — Patient Instructions (Addendum)
 It was nice to see you today! Please try to eat mid afternoon snack between 3-4 pm if blood sugar is less than 150.  Your goal blood sugar is 80-130 before eating and less than 180 after eating.  Continue all medication the same.  Continue to take mealtime insulin  with meals as your food intake improves.  Return to 20 units of Lantus  (insulin  glargine) once daily with food intake returns to normal  Restart Phenergan  25 mg as needed   Keep up the good work with diet and exercise. Aim for a diet full of vegetables, fruit and lean meats (chicken, [place], fish). Try to limit salt intake by eating fresh or frozen vegetables (instead of canned), rinse canned vegetables prior to cooking and do not add any additional salt to meals.

## 2024-09-24 NOTE — Progress Notes (Signed)
 S:     Chief Complaint  Patient presents with   Medication Management    Diabetes CGM management   68 y.o. male who presents for diabetes evaluation, education, and management. Patient arrives in  good spirits and presents without  any assistance. Majority of visit today focused on transition from [place] 2 READER -to [place] 3 READER with Libre3+ sensors  Patient was referred and last seen by Primary Care Provider, Dr. Keneth, on 08/22/24.   PMH is significant for diabetes, tobacco abuse, hypertension, hyperlipidemia, Chronic Kidney Disease Stage 3 Patient reports Diabetes was diagnosed in 2013.   Current diabetes medications include: Basaglar  (insulin  glargine) 10 units daily, Humalog  (insulin  lispro) 5 units with meals, metformin  500 mg 2 tabs BID  Current hypertension medications include: Cartia  XT diltiazem  120 mg once daily and carvedilol  25 mg tablet BID Current hyperlipidemia medications include Repatha  - evolocumab  every 14 days  Patient reports adherence to taking all medications as prescribed.  Do you feel that your medications are working for you? yes Have you been experiencing any side effects to the medications prescribed? no Do you have any problems obtaining medications due to transportation or finances? no  Patient reports few hypoglycemic events.  O:  Review of Systems  All other systems reviewed and are negative.   Physical Exam Constitutional:      Appearance: Normal appearance.  Neurological:     Mental Status: He is alert.  Psychiatric:        Mood and Affect: Mood normal.        Behavior: Behavior normal.        Thought Content: Thought content normal.        Judgment: Judgment normal.    Libre3 CGM Download today 09/11/24-09/24/24 % Time CGM is active: 91% Average Glucose: 213 mg/dL Glucose Management Indicator: 8.4  Glucose Variability: 43.2% (goal <36%) Time in Goal:  - Time in range 70-180: 31% - Time above range: 63% - Time below range:  6% - Note that patient has afternoon lows - counseled on snack if glucose less than 150 around 3-4pm  Lab Results  Component Value Date   HGBA1C 9.5 (A) 08/22/2024   Vitals:   09/24/24 0921  BP: (!) 193/81  Pulse: 73  SpO2: 100%    Lipid Panel     Component Value Date/Time   CHOL 136 06/12/2024 0912   TRIG 100 06/12/2024 0912   HDL 66 06/12/2024 0912   CHOLHDL 2.1 06/12/2024 0912   CHOLHDL 2.0 09/22/2022 0055   VLDL 17 09/22/2022 0055   LDLCALC 52 06/12/2024 0912   LDLDIRECT 105 09/19/2016 0922    Clinical Atherosclerotic Cardiovascular Disease (ASCVD): Yes  A/P: Diabetes longstanding since 2010 currently controlled. Patient is able to verbalize appropriate hypoglycemia management plan. Medication adherence appears good. Patient reports that he has been sick and not taking his medications for the past few days due to throwing up. Long acting insulin  was adjusted to accommodate for lack of oral intake to 10 units.  -Increased basal insulin  Basaglar  (insulin  glargine) back to 20 units daily as food intake improves -Continued rapid insulin  Humalog  (insulin  lispro) at 3-5 units with meals as food intake improves -Continued metformin  500 mg 2 tab twice daily.  -Counseled on eating a mid afternoon snack between 3-4 pm if blood sugar is less than 150.  - Switched from [place] 2 sensor and reader to [place] 3 sensor. Reader provided.  -Patient educated on purpose, proper use, and potential adverse effects .  -  Extensively discussed pathophysiology of diabetes, recommended lifestyle interventions, dietary effects on blood sugar control.  -Counseled on s/sx of and management of hypoglycemia.   Last LDL is at goal of <70  mg/dL. ASCVD risk factors include diabetes. -Continue Repatha  - evolocumab  140 mg injection every 14 days  Hypertension longstanding since 2010 currently uncontrolled. Blood pressure goal of <130/80  mmHg. Medication adherence ok. Patient reports that he has been sick and  not taking his medications for the past few days due to throwing up and Blood Pressure was elevated today in office at 193/81 likely due to missed doses and stress of illness.  -Continue diltiazem  120 mg daily -Continue carvedilol  25 mg BID RESTART taking when able to tolerate PO medications.  Vomiting/Diarrhea resolving - deferred to Dr. Adele today. -Restart Phenergan  25 mg as needed for nausea   Written patient instructions provided. Patient verbalized understanding of treatment plan.  Total time in face to face counseling 27 minutes.    Follow-up:  Pharmacist: Not scheduled, can be seen as needed.  PCP clinic visit today  Patient seen with Lawson Mao, PharmD Candidate - PY3 student and Belvie Macintosh, PharmD - PY4 Candidate.

## 2024-09-24 NOTE — Assessment & Plan Note (Addendum)
 Hypertension longstanding since 2010 currently uncontrolled. Blood pressure goal of <130/80  mmHg. Medication adherence ok. Patient reports that he has been sick and not taking his medications for the past few days due to throwing up and Blood Pressure was elevated today in office at 193/81 likely due to missed doses and stress of illness.  -Continue diltiazem  120 mg daily -Continue carvedilol  25 mg BID RESTART taking when able to tolerate PO medications.

## 2024-09-24 NOTE — Progress Notes (Addendum)
    SUBJECTIVE:   CHIEF COMPLAINT / HPI:   GI upset Saw Dr Koval prior to this appt, was prescribed phenergan . Koval adjusted insulin  regimen Nausea, a few episodes of vomiting, some diarrhea. Nonbloody Sx started Sat after going to baseball game No fevers No sick contact No med changes Drinking fluids, broth as of last night Pt has not taken home meds (BP meds) due to N/V. Took insulin  this morning Aching back and joints  PERTINENT  PMH / PSH: DM2, PAD, CHF, polyneuropathy, CKD, spinal stenosis, chronic pain  OBJECTIVE:   BP (!) 183/85   Pulse 72   Ht 5' 11 (1.803 m)   SpO2 100%   BMI 19.75 kg/m   - General: No acute distress. Mildly uncomfortable and tired appearing. Awake and conversant. - Eyes: Normal conjunctiva, anicteric. Round symmetric pupils. - ENT: Hearing grossly intact. No nasal discharge. - Neck: Neck is supple. No masses or thyromegaly. - Respiratory: Respirations are non-labored. - Skin: No visible rashes or ulcers. - Psych: Alert and oriented. Cooperative, Appropriate mood and affect, Normal judgment. - MSK: Normal ambulation. - Neuro: Sensation and CN II-XII grossly normal.   ASSESSMENT/PLAN:   Assessment & Plan Gastroenteritis Patient with 3 to 4 days of nausea, nonbloody emesis, nonbloody diarrhea.  Suspect viral etiology.  Has not been taking home medicines for the last few days due to ongoing nausea-elevated blood pressure x 2 (193/81 taken for [facility] pharmacy appt) reflects this.  [facility] pharmacist provided Phenergan  prescription and adjusted insulin  regimen.  Reassured the patient able to tolerate fluids and appears well overall.  Advised continuation of fluids with electrolytes and resumption of home meds as able.  Advised close follow-up if symptoms are not improving in the next 1 to 3 days.  Called patient back after appt- states he does not have any pain when he pushes on his abdomen. Reassuring against peritonitis, alongside patient's general well  appearance and normal ambulation. Also advised holding metformin  for the next day or two as he recovers as this may exacerbate his GI upset. Pt voiced understanding.      Rea Raring, MD [facility]

## 2024-09-24 NOTE — Patient Instructions (Signed)
 Thank you for coming in today! Here is a summary of what we discussed:  Sorry you aren't feeling well! Keep drinking Gatorade, broth, or any other fluid with electrolytes. Please try to start taking your medications again too.   Please let us  know if your symptoms aren't getting better in the next 1-3 days or if you develop any new symptoms.  Please call the clinic at [phone] if your symptoms worsen or you have any concerns.  Best, Dr Adele

## 2024-09-27 NOTE — Progress Notes (Signed)
 Reviewed and agree with Dr Rennis plan.

## 2024-10-01 DIAGNOSIS — N189 Chronic kidney disease, unspecified: Secondary | ICD-10-CM | POA: Diagnosis not present

## 2024-10-01 DIAGNOSIS — R809 Proteinuria, unspecified: Secondary | ICD-10-CM | POA: Diagnosis not present

## 2024-10-01 DIAGNOSIS — N1832 Chronic kidney disease, stage 3b: Secondary | ICD-10-CM | POA: Diagnosis not present

## 2024-10-01 DIAGNOSIS — I129 Hypertensive chronic kidney disease with stage 1 through stage 4 chronic kidney disease, or unspecified chronic kidney disease: Secondary | ICD-10-CM | POA: Diagnosis not present

## 2024-10-01 DIAGNOSIS — N179 Acute kidney failure, unspecified: Secondary | ICD-10-CM | POA: Diagnosis not present

## 2024-10-01 DIAGNOSIS — N2581 Secondary hyperparathyroidism of renal origin: Secondary | ICD-10-CM | POA: Diagnosis not present

## 2024-10-01 DIAGNOSIS — D631 Anemia in chronic kidney disease: Secondary | ICD-10-CM | POA: Diagnosis not present

## 2024-10-07 ENCOUNTER — Other Ambulatory Visit: Payer: Self-pay | Admitting: Family Medicine

## 2024-10-07 DIAGNOSIS — G629 Polyneuropathy, unspecified: Secondary | ICD-10-CM

## 2024-10-07 DIAGNOSIS — M545 Low back pain, unspecified: Secondary | ICD-10-CM

## 2024-10-07 DIAGNOSIS — M48062 Spinal stenosis, lumbar region with neurogenic claudication: Secondary | ICD-10-CM

## 2024-10-07 DIAGNOSIS — G8929 Other chronic pain: Secondary | ICD-10-CM

## 2024-10-08 ENCOUNTER — Telehealth: Payer: Self-pay | Admitting: Pharmacist

## 2024-10-08 MED ORDER — FREESTYLE LIBRE 3 PLUS SENSOR MISC: 3 refills | Status: DC

## 2024-10-08 NOTE — Telephone Encounter (Signed)
 Patient contacts office to request assistance with CGM (continuous glucose monitor) supply. He states his pharmacy filled the libre2+ sensors.   He has Libre 3+ sensors as a prescription on his profile.  Resent the previous prescription to his pharmacy.   Patient thanked me for the assistance.   Total time with patient call and documentation of interaction: 9 minutes.

## 2024-10-09 NOTE — Telephone Encounter (Signed)
 Reviewed and agree with Dr Rennis plan.

## 2024-10-18 ENCOUNTER — Telehealth: Payer: Self-pay

## 2024-10-18 NOTE — Telephone Encounter (Signed)
 Reviewed and agree.

## 2024-10-18 NOTE — Telephone Encounter (Signed)
 Patients wife calls nurse line reporting leg weakness.   She reports he just got home from work and could barely make it up the front steps. 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   She reports he already has neuropathy in his feet that may be a contributing factor.   She denies any one sided weakness, dizziness, vision changes, facial changes or slurring of speech. No shortness of breath or chest pains.  She reports he is resting on the couch and drinking fluids.   She is requesting a Monday morning apt for evaluation.   She reports she is going to monitor him for the next few hours. She reports if no change will take him the ED.  ED precautions discussed for worsening symptoms or no improvement.

## 2024-10-21 ENCOUNTER — Ambulatory Visit: Admitting: Family Medicine

## 2024-10-24 ENCOUNTER — Telehealth: Payer: Self-pay | Admitting: Pharmacist

## 2024-10-24 ENCOUNTER — Other Ambulatory Visit (HOSPITAL_COMMUNITY): Payer: Self-pay

## 2024-10-24 NOTE — Telephone Encounter (Signed)
 Patient contacts office to report prolonged period of hypoglycemia yesterday.   Patient reported taking long-acting Basaglar  (insulin  glargine) yesterday AM - dose 10 units.  Denies use of short-acting in the AM due to not eating much of a breakfast.   3-4 hours later, patient reports that shortly after 12:00 Noon, he had a CGM alarm and symptoms of low glucose.  Despite attempts to correct low glucose reading with eating everything he had, patient reports his sugar dropped to 48.   He also shared that he glucose did not return to normal until approximately 9:30 PM  We discussed that low reading could be sensor failure and he needs to verify with blood glucose meter.  We also discussed the role of glucagon and potential need for glucagon rescue.  Due to the long history of diabetes, we agreed that use of a glucagon rescue strategy should be implemented.  Patient scheduled at first available appointment to be seen for Glucagon education.   11/19  Plan to determine preferred formulation with help of [facility], CPhT prior to visit.  Total time with patient call and documentation of interaction: 12 minutes.

## 2024-10-24 NOTE — Telephone Encounter (Signed)
 Colin Keller, CPhT assisted to determine that Baqsimi (nasal glucagon) is preferred at $0 copay for [employer] insurance.   Plan to order at upcoming visit.

## 2024-10-30 ENCOUNTER — Ambulatory Visit (INDEPENDENT_AMBULATORY_CARE_PROVIDER_SITE_OTHER): Admitting: Pharmacist

## 2024-10-30 ENCOUNTER — Encounter: Payer: Self-pay | Admitting: Pharmacist

## 2024-10-30 VITALS — BP 178/53 | HR 62 | Wt 146.8 lb

## 2024-10-30 DIAGNOSIS — E785 Hyperlipidemia, unspecified: Secondary | ICD-10-CM | POA: Diagnosis not present

## 2024-10-30 DIAGNOSIS — E1169 Type 2 diabetes mellitus with other specified complication: Secondary | ICD-10-CM

## 2024-10-30 DIAGNOSIS — E1151 Type 2 diabetes mellitus with diabetic peripheral angiopathy without gangrene: Secondary | ICD-10-CM | POA: Diagnosis not present

## 2024-10-30 DIAGNOSIS — E1159 Type 2 diabetes mellitus with other circulatory complications: Secondary | ICD-10-CM

## 2024-10-30 DIAGNOSIS — I152 Hypertension secondary to endocrine disorders: Secondary | ICD-10-CM | POA: Diagnosis not present

## 2024-10-30 MED ORDER — HYDROCHLOROTHIAZIDE 12.5 MG PO TABS: 12.5000 mg | ORAL_TABLET | Freq: Every day | ORAL | 3 refills | Status: DC

## 2024-10-30 MED ORDER — BAQSIMI TWO PACK 3 MG/DOSE NA POWD: NASAL | 5 refills | Status: DC

## 2024-10-30 NOTE — Assessment & Plan Note (Signed)
 Hypertension longstanding since 2010 currently uncontrolled with multiple in office Blood Pressure >190 systolic. Blood pressure goal of <130/80  mmHg. Medication adherence good. Blood pressure control is suboptimal due to suboptimal regimen. Patient reports that he was out of carvedilol  but picked it up earlier this morning. -Continued Carvedilol  25 mg BID, Diltiazem  120 mg SR daily, hydralazine  50 mg TID -Start hydrochlorothiazide  12.5 mg daily -BMET ordered

## 2024-10-30 NOTE — Patient Instructions (Addendum)
 It was nice to see you today!  Your goal blood sugar is 80-130 before eating and less than 180 after eating.  Medication Changes: START Baqsimi  nasal spray as needed for hypoglycemic episodes RESTART hydrochlorothiazide  12.5 mg daily  Continue all other medication the same.   Keep up the good work with diet and exercise. Aim for a diet full of vegetables, fruit and lean meats (chicken, turkey, fish). Try to limit salt intake by eating fresh or frozen vegetables (instead of canned), rinse canned vegetables prior to cooking and do not add any additional salt to meals.

## 2024-10-30 NOTE — Progress Notes (Signed)
 S:     Chief Complaint  Patient presents with   Medication Management    Diabetes management   68 y.o. male who presents for diabetes evaluation, education, and management. Patient arrives in  good spirits and presents without  any assistance.  Patient was referred and last seen by Primary Care Provider, Dr. Adele, on 09/24/24.   PMH is significant for PAD, T2DM, hypertension, hyperlipidemia, HFmrEF, Chronic Kidney Disease Stage 3b.  Patient reports Diabetes was diagnosed in 2013.   Current diabetes medications include: Basaglar  (insulin  glargine) 10-12 units, Humalog  (insulin  lispro) 3-5 units,  Current hypertension medications include: Carvedilol  25 mg BID, Diltiazem  120 mg SR daily, hydralazine  50 mg TID Current hyperlipidemia medications include: Repatha    Patient reports adherence to taking all medications as prescribed.   Patient reports hypoglycemic events.  Patient reported dietary habits: Eats 2 meals/day  O:  Review of Systems  All other systems reviewed and are negative.  Physical Exam Vitals reviewed.  Pulmonary:     Effort: Pulmonary effort is normal.  Neurological:     Mental Status: He is alert.  Psychiatric:        Mood and Affect: Mood normal.        Behavior: Behavior normal.        Thought Content: Thought content normal.        Judgment: Judgment normal.    Libre3 CGM Download today not available due to reader not connecting to portal Average glucose: 284 TIR: 14%  Lab Results  Component Value Date   HGBA1C 9.5 (A) 08/22/2024   Vitals:   10/30/24 1044 10/30/24 1115  BP: (!) 205/77 (!) 178/53  Pulse:  62  SpO2:  98%    Lipid Panel     Component Value Date/Time   CHOL 136 06/12/2024 0912   TRIG 100 06/12/2024 0912   HDL 66 06/12/2024 0912   CHOLHDL 2.1 06/12/2024 0912   CHOLHDL 2.0 09/22/2022 0055   VLDL 17 09/22/2022 0055   LDLCALC 52 06/12/2024 0912   LDLDIRECT 105 09/19/2016 0922   A/P: Diabetes longstanding since 2013  currently uncontrolled with average glucose on CGM of 284 and TIR 14%. Patient is  able to verbalize appropriate hypoglycemia management plan. Medication adherence appears good. Control is suboptimal due to suboptimal regimen. Patient reports experiencing one extended hypoglycemic episode that lasted 6-7 hours and was not resolved with meals. -Continued Basaglar  (insulin  glargine) 10-12 units daily -Continued Humalog  (insulin  lispro) 2-4 units with meals -Continued metformin  1000mg  BID -Prescribed Baqsimi nasal glucagon rescue for any hypoglycemic episodes -Patient educated on purpose, proper use, and potential adverse effects.  -Extensively discussed pathophysiology of diabetes, recommended lifestyle interventions, dietary effects on blood sugar control.  -Counseled on s/sx of and management of hypoglycemia.   ASCVD risk - Secondary prevention in patient with diabetes. Last LDL is 52 at goal of <70  mg/dL. Patient has intolerance to statins - myalgias.  -Continued Repatha    Hypertension longstanding since 2010 currently uncontrolled with multiple in office Blood Pressure >190 systolic. Blood pressure goal of <130/80  mmHg. Medication adherence good. Blood pressure control is suboptimal due to suboptimal regimen. Patient reports that he was out of carvedilol  but picked it up earlier this morning. -Continued Carvedilol  25 mg BID, Diltiazem  120 mg SR daily, hydralazine  50 mg TID -Start hydrochlorothiazide  12.5 mg daily -BMET ordered   Written patient instructions provided. Patient verbalized understanding of treatment plan.  Total time in face to face counseling 38 minutes.  Follow-up:  Pharmacist 11/27/2024 Patient seen with Lawson Mao, PharmD Candidate - PY3 student and [employer] PharmD - PY4 Candidate.

## 2024-10-30 NOTE — Assessment & Plan Note (Signed)
 Diabetes longstanding since 2013 currently uncontrolled with average glucose on CGM of 284 and TIR 14%. Patient is  able to verbalize appropriate hypoglycemia management plan. Medication adherence appears good. Control is suboptimal due to suboptimal regimen. Patient reports experiencing one extended hypoglycemic episode that lasted 6-7 hours and was not resolved with meals. -Continued Basaglar  (insulin  glargine) 10-12 units daily -Continued Humalog  (insulin  lispro) 2-4 units with meals -Continued metformin  1000mg  BID -Prescribed Baqsimi  nasal glucagon  rescue for any hypoglycemic episodes -Patient educated on purpose, proper use, and potential adverse effects.  -Extensively discussed pathophysiology of diabetes, recommended lifestyle interventions, dietary effects on blood sugar control.  -Counseled on s/sx of and management of hypoglycemia.

## 2024-10-30 NOTE — Assessment & Plan Note (Signed)
 ASCVD risk - Secondary prevention in patient with diabetes. Last LDL is 52 at goal of <70  mg/dL. Patient has intolerance to statins - myalgias.  -Continued Repatha 

## 2024-10-31 ENCOUNTER — Telehealth: Payer: Self-pay

## 2024-10-31 ENCOUNTER — Telehealth: Payer: Self-pay | Admitting: Pharmacist

## 2024-10-31 LAB — BASIC METABOLIC PANEL WITH GFR
BUN/Creatinine Ratio: 18 (ref 10–24)
BUN: 38 mg/dL — ABNORMAL HIGH (ref 8–27)
CO2: 21 mmol/L (ref 20–29)
Calcium: 8.7 mg/dL (ref 8.6–10.2)
Chloride: 105 mmol/L (ref 96–106)
Creatinine, Ser: 2.06 mg/dL — ABNORMAL HIGH (ref 0.76–1.27)
Glucose: 292 mg/dL — ABNORMAL HIGH (ref 70–99)
Potassium: 5.1 mmol/L (ref 3.5–5.2)
Sodium: 137 mmol/L (ref 134–144)
eGFR: 34 mL/min/1.73 — ABNORMAL LOW (ref >59)

## 2024-10-31 NOTE — Telephone Encounter (Signed)
 Current enrollment with [employer] for Basaglar  and Humalog  assistance ends 12/11/24.

## 2024-10-31 NOTE — Telephone Encounter (Signed)
 Patient contacted for follow-up of lab results  Shared that BMET values were stable/normal.  No change in plan as discussed yesterday.   Total time with patient call and documentation of interaction: 3 minutes.

## 2024-10-31 NOTE — Progress Notes (Signed)
 Reviewed and agree with Dr Rennis plan.

## 2024-11-01 NOTE — Telephone Encounter (Signed)
 Reviewed and agree with Dr Rennis plan.

## 2024-11-05 ENCOUNTER — Other Ambulatory Visit: Payer: Self-pay | Admitting: Family Medicine

## 2024-11-05 DIAGNOSIS — G8929 Other chronic pain: Secondary | ICD-10-CM

## 2024-11-05 DIAGNOSIS — G629 Polyneuropathy, unspecified: Secondary | ICD-10-CM

## 2024-11-05 DIAGNOSIS — M48062 Spinal stenosis, lumbar region with neurogenic claudication: Secondary | ICD-10-CM

## 2024-11-06 ENCOUNTER — Telehealth: Payer: Self-pay

## 2024-11-06 NOTE — Telephone Encounter (Signed)
 Wife returned call to nurse line. Advised that rx was sent to pharmacy.  Scheduled patient for med follow up on 01/09/25.  Chiquita JAYSON English, RN

## 2024-11-06 NOTE — Telephone Encounter (Signed)
 Received call from patient and patient's spouse regarding questions with blood pressure medications.   Patient is asking how many BP medications he is supposed to be taking.   Advised patient that Dr. Koval added new medication, hydrochlorothiazide  after last visit. Patient has not yet picked this up.   Advised patient of the following per Dr. Rennis note.   Continued Carvedilol  25 mg BID, Diltiazem  120 mg SR daily, hydralazine  50 mg TID -Start hydrochlorothiazide  12.5 mg daily  Patient will follow up with our office if he has any concerns after stating new medication.   Chiquita JAYSON English, RN

## 2024-11-12 ENCOUNTER — Encounter: Payer: Self-pay | Admitting: Cardiovascular Disease

## 2024-11-12 ENCOUNTER — Ambulatory Visit: Attending: Cardiovascular Disease | Admitting: Cardiovascular Disease

## 2024-11-12 VITALS — BP 190/62 | HR 68 | Ht 70.0 in | Wt 154.0 lb

## 2024-11-12 DIAGNOSIS — R0602 Shortness of breath: Secondary | ICD-10-CM

## 2024-11-12 DIAGNOSIS — I6523 Occlusion and stenosis of bilateral carotid arteries: Secondary | ICD-10-CM

## 2024-11-12 DIAGNOSIS — I1A0 Resistant hypertension: Secondary | ICD-10-CM | POA: Diagnosis not present

## 2024-11-12 DIAGNOSIS — I25118 Atherosclerotic heart disease of native coronary artery with other forms of angina pectoris: Secondary | ICD-10-CM | POA: Diagnosis not present

## 2024-11-12 DIAGNOSIS — I739 Peripheral vascular disease, unspecified: Secondary | ICD-10-CM

## 2024-11-12 DIAGNOSIS — Z720 Tobacco use: Secondary | ICD-10-CM

## 2024-11-12 DIAGNOSIS — I10 Essential (primary) hypertension: Secondary | ICD-10-CM

## 2024-11-12 DIAGNOSIS — E785 Hyperlipidemia, unspecified: Secondary | ICD-10-CM | POA: Diagnosis not present

## 2024-11-12 NOTE — Patient Instructions (Signed)
 Medication Instructions:  No changes *If you need a refill on your cardiac medications before your next appointment, please call your pharmacy*  Lab Work: None ordered If you have labs (blood work) drawn today and your tests are completely normal, you will receive your results only by: MyChart Message (if you have MyChart) OR A paper copy in the mail If you have any lab test that is abnormal or we need to change your treatment, we will call you to review the results.  Testing/Procedures: Your physician has requested that you have an echocardiogram. Echocardiography is a painless test that uses sound waves to create images of your heart. It provides your doctor with information about the size and shape of your heart and how well your heart's chambers and valves are working. You may receive an ultrasound enhancing agent through an IV if needed to better visualize your heart during the echo.This procedure takes approximately one hour. There are no restrictions for this procedure.  This will take place at [address] [facility], 2nd floor  Your physician has requested that you have a renal artery duplex. During this test, an ultrasound is used to evaluate blood flow to the kidneys. Take your medications as you usually do. This will take place at [address] 4th floor.  No food after 11PM the night before.  Water is OK. (Don't drink liquids if you have been instructed not to for ANOTHER test). Avoid foods that produce bowel gas, for 24 hours prior to exam (see below). No breakfast, no chewing gum, no smoking or carbonated beverages. Patient may take morning medications with water. Come in for test at least 15 minutes early to register.  Please note: We ask at that you not bring children with you during ultrasound (echo/ vascular) testing. Due to room size and safety concerns, children are not allowed in the ultrasound rooms during exams. Our front office staff cannot provide observation of children  in our lobby area while testing is being conducted. An adult accompanying a patient to their appointment will only be allowed in the ultrasound room at the discretion of the ultrasound technician under special circumstances. We apologize for any inconvenience.   Follow-Up: At [facility], you and your health needs are our priority.  As part of our continuing mission to provide you with exceptional heart care, our providers are all part of one team.  This team includes your primary Cardiologist (physician) and Advanced Practice Providers or APPs (Physician Assistants and Nurse Practitioners) who all work together to provide you with the care you need, when you need it.  Your next appointment:   2 month(s)  Provider:   Dr. Arida

## 2024-11-12 NOTE — Progress Notes (Signed)
 Cardiology Office Note   Date:  11/12/2024   ID:  Colin Keller, DOB 1956-05-05, MRN [id number]  PCP:  McDiarmid, Krystal BIRCH, MD  Cardiologist: Dr. Pietro  No chief complaint on file.      History of Present Illness: Colin Keller is a 68 y.o. male who is here today for follow-up visit regarding peripheral arterial disease.  He has known history of coronary artery disease with previous non-ST elevation myocardial infarction in 2019.  Cardiac catheterization showed severe three-vessel coronary artery disease and he was treated with CABG.  He has chronic medical conditions include hyperlipidemia with intolerance to statins, chronic kidney disease, type 2 diabetes and carotid artery disease. Previous screening for abdominal aortic aneurysm in 2020 was negative.  Moderately elevated velocities were noted in the iliac arteries. He has been followed for peripheral arterial disease with bilateral SFA and popliteal disease.  He had worsening symptoms in 2023 with progression to rest pain on the left side.  Repeat Doppler studies showed a drop in ABI to 0.39 on the left. Angiography in October 2023 showed no significant aortoiliac disease.  On the left side, there was severely calcified disease affecting the proximal as well as distal SFA with three-vessel runoff below the knee.  There was short occlusion of the distal SFA.  I performed successful orbital atherectomy and drug-coated balloon angioplasty to the left SFA with excellent results.  He was hospitalized in October, 2024  with hyperglycemia and ketoacidosis in the setting of COVID-19 infection.  Jardiance  was discontinued.  He had acute on chronic renal disease that improved with hydration.  He was found to have mildly elevated troponin which was felt to be due to supply demand ischemia.  Most recent lower extremity arterial Doppler study showed an ABI of 0.75 on the right and 0.90 on the left.  Duplex on the left showed severe focal stenosis  in the mid SFA with peak velocity of almost 500.  He reports worsening lower extremity edema mostly on the right side.  This is usually worse at the end of the day and gets better next morning.  He also reports worsening shortness of breath with no chest pain. He did not report claudication before but now he reports bilateral calf claudication slightly worse on the right than the left.  This is happening with short distance walking.     Past Medical History:  Diagnosis Date   Acute diastolic heart failure (HCC) 08/18/2018   AKI (acute kidney injury) 10/04/2023   Atherosclerosis of both carotid arteries 08/18/2020   Carotid dopplers 08/2019 40-59% bilateral stenosis and right subclavian stenosis   Bilateral carotid artery stenosis 08/18/2020   Right Carotid: Velocities in the right ICA are consistent with a 60-79%                 stenosis. Non-hemodynamically significant plaque <50% noted in                 the CCA. The ECA appears >50% stenosed. Proximal subclavian                 artery dilatation with elevated and turbulent flow just past the                 narrowing, measuring 1.6 cm.   Left Carotid: Velocities in the left ICA are    CHF (congestive heart failure) (HCC)    Chronic congestive heart failure (HCC) 10/04/2023   Transthoracic echocardiogram 10/08/2018: LVEF is  approximately 50% with hypokinesis of the lateral (base/mid) and basal inferior walls T     Chronic left shoulder pain 03/18/2021   Chronic low back pain without sciatica 10/16/2008   H/o chronic LBP with h/o lumbar disc herniation and intermittent radicular pain  Chronic pain, on stable doses of Norco 10/325 TID. MRI in 2003 showing SMALL CENTRAL DISC HERNIATION AT L4-5.  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      CKD (chronic kidney disease), stage III (HCC)    Coronary artery disease    Coronary artery disease involving native heart with angina pectoris, unspecified vessel or lesion type  08/10/2023   COVID 10/04/2023   Diabetes mellitus    DKA (diabetic ketoacidosis) (HCC) 10/03/2023   Dyspnea    Fall 10/04/2023   GERD (gastroesophageal reflux disease)    Hx of CABG 08/23/2018   LIMA to LAD, RIMA to RCA, SVG to OM3, EVH via right thigh   Hyperlipidemia    Hypertension    Hypertension associated with diabetes (HCC) 12/22/2008   Qualifier: Diagnosis of  By: Edrick MD, Susan     Hypertensive nephropathy 08/18/2020   Insulin  dependent type 2 diabetes mellitus (HCC) 12/22/2020   Left ventricular dysfunction 08/18/2020   TTE ([id number]): LVEF is approximately 50% with hypokinesis of the lateral (base/mid) and basal inferior walls The cavity size was normal.      Long term (current) use of antithrombotics/antiplatelets 09/21/2022   Planned 37-months DAPT for DES for PAD on 09/21/2022.  Planned 6 months therapy. End 03/23/23.   Peripheral artery disease 08/31/2021   [facility] Card[facility] Vascular Study lower extremities 08/30/21:            Today's ABI  Today's TBI   Previous ABIPrevious TBI  +-------+-----------+-----------+------------+------------+  Right  0.76       0.31       1.03                      +-------+-----------+-----------+------------+------------+  Left   0.63       0.20       0.95                      +-------+-----------+---------   Proteinuria 08/18/2020   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   LIMA to LAD, RIMA to RCA, SVG to OM3, EVH via right thigh   Sleeping difficulties 09/08/2019   Subclavian artery stenosis, right 08/18/2020   Carotid dopplers 08/2019 40-59% bilateral stenosis and right subclavian stenosis   Tobacco abuse    Tobacco abuse disorder    Qualifier: Diagnosis of  By: Edrick MD, Devere      Past Surgical History:  Procedure Laterality Date   ABDOMINAL AORTOGRAM W/LOWER EXTREMITY N/A 09/21/2022   Procedure: ABDOMINAL AORTOGRAM W/LOWER EXTREMITY;  Surgeon: Darron Deatrice LABOR, MD;  Location: [facility] INVASIVE CV LAB;  Service: Cardiovascular;   Laterality: N/A;   CORONARY ARTERY BYPASS GRAFT N/A 08/23/2018   Procedure: CORONARY ARTERY BYPASS GRAFTING (CABG) x 3; -Left Internal Mammary Artery to Left Anterior Descending Artery, -Right Internal Mammary Artery to Right Coronary Artery, -Saphenous Vein Graft to Obtuse Marginal;  ENDOSCOPIC HARVEST GREATER SAPHENOUS VEIN  -Right Thigh;  Surgeon: Dusty Sudie DEL, MD;  Location: [facility] OR;  Service: Open Heart Surgery;  Laterality: N/A;   CORONARY PRESSURE/FFR STUDY N/A 08/20/2018   Procedure: INTRAVASCULAR PRESSURE WIRE/FFR STUDY;  Surgeon: Wonda Sharper, MD;  Location: [facility] INVASIVE CV LAB;  Service: Cardiovascular;  Laterality: N/A;   LEFT HEART CATH AND CORONARY ANGIOGRAPHY N/A 08/20/2018   Procedure: LEFT HEART CATH AND CORONARY ANGIOGRAPHY;  Surgeon: Wonda Sharper, MD;  Location: [facility] INVASIVE CV LAB;  Service: Cardiovascular;  Laterality: N/A;   PERIPHERAL VASCULAR ATHERECTOMY Left 09/21/2022   Procedure: PERIPHERAL VASCULAR ATHERECTOMY;  Surgeon: Darron Deatrice LABOR, MD;  Location: [facility] INVASIVE CV LAB;  Service: Cardiovascular;  Laterality: Left;  SFA   PERIPHERAL VASCULAR BALLOON ANGIOPLASTY Left 09/21/2022   Procedure: PERIPHERAL VASCULAR BALLOON ANGIOPLASTY;  Surgeon: Darron Deatrice LABOR, MD;  Location: [facility] INVASIVE CV LAB;  Service: Cardiovascular;  Laterality: Left;  SFA   TEE WITHOUT CARDIOVERSION N/A 08/23/2018   Procedure: TRANSESOPHAGEAL ECHOCARDIOGRAM (TEE);  Surgeon: Dusty Sudie DEL, MD;  Location: [facility] OR;  Service: Open Heart Surgery;  Laterality: N/A;     Current Outpatient Medications  Medication Sig Dispense Refill   aspirin  81 MG tablet Take 81 mg by mouth daily.     carvedilol  (COREG ) 25 MG tablet TAKE 1 TABLET BY MOUTH TWICE DAILY WITH A MEAL 180 tablet 3   cetirizine (ZYRTEC) 10 MG chewable tablet Chew 10 mg by mouth daily.     Continuous Glucose Sensor (FREESTYLE LIBRE 3 PLUS SENSOR) MISC Change sensor every 15 days. 6 each 3   diltiazem  (CARTIA  XT) 120 MG 24 hr capsule TAKE 1  CAPSULE BY MOUTH DAILY 90 capsule 3   Evolocumab  (REPATHA  SURECLICK) 140 MG/ML SOAJ Inject 140 mg into the skin every 14 (fourteen) days. 6 mL 3   famotidine  (PEPCID ) 20 MG tablet Take 20 mg by mouth daily.     hydrALAZINE  (APRESOLINE ) 50 MG tablet Take 1 tablet (50 mg total) by mouth 3 (three) times daily. 180 tablet 1   hydrochlorothiazide  (HYDRODIURIL ) 12.5 MG tablet Take 1 tablet (12.5 mg total) by mouth daily. 30 tablet 3   HYDROcodone -acetaminophen  (NORCO) 10-325 MG tablet TAKE 1 TABLET BY MOUTH TWICE DAILY AS NEEDED 60 tablet 0   Insulin  Glargine (BASAGLAR  KWIKPEN) 100 UNIT/ML DIAL AND INJECT 20 UNITS UNDER THE SKIN DAILY. (Patient taking differently: 10-12 Units daily.) 18 mL 5   insulin  lispro (HUMALOG ) 100 UNIT/ML injection Inject 0.03-0.05 mLs (3-5 Units total) into the skin 3 (three) times daily with meals. 3-4 units prior to breakfast, 4-5 units prior to evening meal 6 mL 5   metFORMIN  (GLUCOPHAGE ) 500 MG tablet Take 2 tablets (1,000 mg total) by mouth 2 (two) times daily with a meal. TAKE 2 TABLETS BY MOUTH TWICE DAILY WITH A MEAL 360 tablet 3   promethazine  (PHENERGAN ) 25 MG tablet Take 1 tablet (25 mg total) by mouth every 6 (six) hours as needed for nausea or vomiting. 15 tablet 0   tamsulosin  (FLOMAX ) 0.4 MG CAPS capsule Take 1 capsule (0.4 mg total) by mouth daily. 90 capsule 3   Cyanocobalamin (VITAMIN B 12 PO) Take 1 tablet by mouth daily. (Patient not taking: Reported on 11/12/2024)     Glucagon  (BAQSIMI  TWO PACK) 3 MG/DOSE POWD Use as directed PRN low glucose 1 each 5   No current facility-administered medications for this visit.    Allergies:   Amlodipine , Empagliflozin , Amitriptyline , Lisinopril , Nortriptyline  hcl, Statins, and Simvastatin     Social History:  The patient  reports that he has been smoking cigarettes. He started smoking about 54 years ago. He has a 48 pack-year smoking history. He has never used smokeless tobacco. He reports that he does not drink alcohol  and does not use drugs.   Family History:  The patient's  family history includes Diabetes in his mother; Drug abuse in his son; Heart disease in his father and mother; Heart failure in his mother; Sudden death in his brother.    ROS:  Please see the history of present illness.   Otherwise, review of systems are positive for none.   All other systems are reviewed and negative.    PHYSICAL EXAM: VS:  BP (!) 190/62   Pulse 68   Ht 5' 10 (1.778 m)   Wt 154 lb (69.9 kg)   SpO2 98%   BMI 22.10 kg/m  , BMI Body mass index is 22.1 kg/m. GEN: Well nourished, well developed, in no acute distress  HEENT: normal  Neck: no  masses.  Bilateral carotid bruits.  Mild JVD Cardiac: RRR; no murmurs, rubs, or gallops, moderate edema worse on the right side Respiratory:  clear to auscultation bilaterally, normal work of breathing GI: soft, nontender, nondistended, + BS MS: no deformity or atrophy  Skin: warm and dry, no rash Neuro:  Strength and sensation are intact Psych: euthymic mood, full affect   EKG:  EKG is not ordered today.    Recent Labs: 11/24/2023: Hemoglobin 10.1; Platelets 248 06/11/2024: ALT 8 10/30/2024: BUN 38; Creatinine, Ser 2.06; Potassium 5.1; Sodium 137    Lipid Panel    Component Value Date/Time   CHOL 136 06/12/2024 0912   TRIG 100 06/12/2024 0912   HDL 66 06/12/2024 0912   CHOLHDL 2.1 06/12/2024 0912   CHOLHDL 2.0 09/22/2022 0055   VLDL 17 09/22/2022 0055   LDLCALC 52 06/12/2024 0912   LDLDIRECT 105 09/19/2016 0922      Wt Readings from Last 3 Encounters:  11/12/24 154 lb (69.9 kg)  10/30/24 146 lb 12.8 oz (66.6 kg)  09/24/24 141 lb 9.6 oz (64.2 kg)          No data to display            ASSESSMENT AND PLAN:  1.  Peripheral arterial disease: Stat is post atherectomy and drug-coated balloon angioplasty to the left SFA in 2023. He has focal restenosis in the left mid SFA.  He now reports bilateral calf claudication which seems to be severe.   Will monitor this for now given worsening renal function.  2.  Coronary artery disease involving native coronary arteries with other forms of angina: He denies chest pain but reports significant worsening of exertional dyspnea.  There might be an element of heart failure as well with increased lower extremity edema and mild JVD.  I requested an echocardiogram for evaluation.  3.  Hyperlipidemia: He is intolerant to statin but doing well with Repatha .  Most recent lipid profile showed an LDL of 52.  4.  Resistant hypertension: His blood pressure is uncontrolled in spite of 4 antihypertensive medications.  In addition, there has been gradual decline of renal function.  I am concerned about the possibility of bilateral renal artery stenosis as a culprit.  I requested renal artery duplex.   5.  Carotid artery disease: Recent carotid Doppler showed stable 60 to 79% right carotid stenosis and 40 to 59% left carotid stenosis.  Repeat studies in March  6.  Tobacco use: He cut down to few cigarettes a day but has not been able to quit completely.  I discussed the importance of smoking cessation.    Disposition:   Follow-up in 2 months.  Signed,  Deatrice Cage, MD  11/12/2024 9:36 AM    [facility]

## 2024-11-15 ENCOUNTER — Emergency Department (HOSPITAL_COMMUNITY)

## 2024-11-15 ENCOUNTER — Encounter (HOSPITAL_COMMUNITY): Payer: Self-pay

## 2024-11-15 ENCOUNTER — Other Ambulatory Visit: Payer: Self-pay

## 2024-11-15 ENCOUNTER — Inpatient Hospital Stay (HOSPITAL_COMMUNITY)
Admission: EM | Admit: 2024-11-15 | Discharge: 2024-11-17 | DRG: 175 | Disposition: A | Discharge status: Home / Self Care

## 2024-11-15 DIAGNOSIS — I480 Paroxysmal atrial fibrillation: Secondary | ICD-10-CM | POA: Diagnosis not present

## 2024-11-15 DIAGNOSIS — K219 Gastro-esophageal reflux disease without esophagitis: Secondary | ICD-10-CM | POA: Diagnosis present

## 2024-11-15 DIAGNOSIS — E162 Hypoglycemia, unspecified: Secondary | ICD-10-CM | POA: Insufficient documentation

## 2024-11-15 DIAGNOSIS — K810 Acute cholecystitis: Secondary | ICD-10-CM | POA: Diagnosis not present

## 2024-11-15 DIAGNOSIS — K838 Other specified diseases of biliary tract: Secondary | ICD-10-CM | POA: Diagnosis not present

## 2024-11-15 DIAGNOSIS — E1165 Type 2 diabetes mellitus with hyperglycemia: Secondary | ICD-10-CM | POA: Diagnosis not present

## 2024-11-15 DIAGNOSIS — J90 Pleural effusion, not elsewhere classified: Secondary | ICD-10-CM

## 2024-11-15 DIAGNOSIS — E1151 Type 2 diabetes mellitus with diabetic peripheral angiopathy without gangrene: Secondary | ICD-10-CM | POA: Diagnosis not present

## 2024-11-15 DIAGNOSIS — R4182 Altered mental status, unspecified: Secondary | ICD-10-CM | POA: Diagnosis not present

## 2024-11-15 DIAGNOSIS — R001 Bradycardia, unspecified: Secondary | ICD-10-CM | POA: Diagnosis not present

## 2024-11-15 DIAGNOSIS — D638 Anemia in other chronic diseases classified elsewhere: Secondary | ICD-10-CM | POA: Diagnosis not present

## 2024-11-15 DIAGNOSIS — Z9889 Other specified postprocedural states: Secondary | ICD-10-CM | POA: Diagnosis not present

## 2024-11-15 DIAGNOSIS — R1031 Right lower quadrant pain: Secondary | ICD-10-CM | POA: Diagnosis not present

## 2024-11-15 DIAGNOSIS — E1169 Type 2 diabetes mellitus with other specified complication: Secondary | ICD-10-CM | POA: Diagnosis present

## 2024-11-15 DIAGNOSIS — N179 Acute kidney failure, unspecified: Secondary | ICD-10-CM | POA: Diagnosis not present

## 2024-11-15 DIAGNOSIS — K91841 Postprocedural hemorrhage and hematoma of a digestive system organ or structure following other procedure: Secondary | ICD-10-CM | POA: Diagnosis not present

## 2024-11-15 DIAGNOSIS — I708 Atherosclerosis of other arteries: Secondary | ICD-10-CM | POA: Diagnosis not present

## 2024-11-15 DIAGNOSIS — I10 Essential (primary) hypertension: Secondary | ICD-10-CM | POA: Diagnosis not present

## 2024-11-15 DIAGNOSIS — E1159 Type 2 diabetes mellitus with other circulatory complications: Secondary | ICD-10-CM | POA: Diagnosis not present

## 2024-11-15 DIAGNOSIS — R7401 Elevation of levels of liver transaminase levels: Secondary | ICD-10-CM | POA: Diagnosis not present

## 2024-11-15 DIAGNOSIS — E1121 Type 2 diabetes mellitus with diabetic nephropathy: Secondary | ICD-10-CM | POA: Diagnosis not present

## 2024-11-15 DIAGNOSIS — J918 Pleural effusion in other conditions classified elsewhere: Secondary | ICD-10-CM | POA: Diagnosis not present

## 2024-11-15 DIAGNOSIS — I5033 Acute on chronic diastolic (congestive) heart failure: Secondary | ICD-10-CM

## 2024-11-15 DIAGNOSIS — N1832 Chronic kidney disease, stage 3b: Secondary | ICD-10-CM | POA: Diagnosis not present

## 2024-11-15 DIAGNOSIS — E1122 Type 2 diabetes mellitus with diabetic chronic kidney disease: Secondary | ICD-10-CM | POA: Diagnosis not present

## 2024-11-15 DIAGNOSIS — E440 Moderate protein-calorie malnutrition: Secondary | ICD-10-CM | POA: Diagnosis not present

## 2024-11-15 DIAGNOSIS — Z789 Other specified health status: Secondary | ICD-10-CM

## 2024-11-15 DIAGNOSIS — I2699 Other pulmonary embolism without acute cor pulmonale: Secondary | ICD-10-CM | POA: Diagnosis not present

## 2024-11-15 DIAGNOSIS — K831 Obstruction of bile duct: Secondary | ICD-10-CM | POA: Diagnosis not present

## 2024-11-15 DIAGNOSIS — E119 Type 2 diabetes mellitus without complications: Secondary | ICD-10-CM | POA: Diagnosis not present

## 2024-11-15 DIAGNOSIS — I739 Peripheral vascular disease, unspecified: Secondary | ICD-10-CM | POA: Diagnosis not present

## 2024-11-15 DIAGNOSIS — R918 Other nonspecific abnormal finding of lung field: Secondary | ICD-10-CM | POA: Diagnosis not present

## 2024-11-15 DIAGNOSIS — K869 Disease of pancreas, unspecified: Secondary | ICD-10-CM | POA: Diagnosis not present

## 2024-11-15 DIAGNOSIS — F22 Delusional disorders: Secondary | ICD-10-CM | POA: Diagnosis not present

## 2024-11-15 DIAGNOSIS — K8309 Other cholangitis: Secondary | ICD-10-CM | POA: Diagnosis not present

## 2024-11-15 DIAGNOSIS — R0609 Other forms of dyspnea: Secondary | ICD-10-CM | POA: Diagnosis not present

## 2024-11-15 DIAGNOSIS — I5020 Unspecified systolic (congestive) heart failure: Secondary | ICD-10-CM | POA: Diagnosis present

## 2024-11-15 DIAGNOSIS — I509 Heart failure, unspecified: Secondary | ICD-10-CM

## 2024-11-15 DIAGNOSIS — G9341 Metabolic encephalopathy: Secondary | ICD-10-CM | POA: Diagnosis not present

## 2024-11-15 DIAGNOSIS — I771 Stricture of artery: Secondary | ICD-10-CM | POA: Diagnosis present

## 2024-11-15 DIAGNOSIS — R933 Abnormal findings on diagnostic imaging of other parts of digestive tract: Secondary | ICD-10-CM | POA: Diagnosis not present

## 2024-11-15 DIAGNOSIS — D649 Anemia, unspecified: Secondary | ICD-10-CM | POA: Diagnosis not present

## 2024-11-15 DIAGNOSIS — I2510 Atherosclerotic heart disease of native coronary artery without angina pectoris: Secondary | ICD-10-CM | POA: Diagnosis not present

## 2024-11-15 DIAGNOSIS — R748 Abnormal levels of other serum enzymes: Secondary | ICD-10-CM | POA: Diagnosis not present

## 2024-11-15 DIAGNOSIS — D72825 Bandemia: Secondary | ICD-10-CM | POA: Diagnosis not present

## 2024-11-15 DIAGNOSIS — A419 Sepsis, unspecified organism: Secondary | ICD-10-CM | POA: Diagnosis not present

## 2024-11-15 DIAGNOSIS — I4891 Unspecified atrial fibrillation: Secondary | ICD-10-CM | POA: Diagnosis not present

## 2024-11-15 DIAGNOSIS — R1011 Right upper quadrant pain: Secondary | ICD-10-CM | POA: Diagnosis not present

## 2024-11-15 DIAGNOSIS — I152 Hypertension secondary to endocrine disorders: Secondary | ICD-10-CM | POA: Diagnosis not present

## 2024-11-15 DIAGNOSIS — I779 Disorder of arteries and arterioles, unspecified: Secondary | ICD-10-CM | POA: Diagnosis present

## 2024-11-15 DIAGNOSIS — R739 Hyperglycemia, unspecified: Secondary | ICD-10-CM

## 2024-11-15 DIAGNOSIS — I5022 Chronic systolic (congestive) heart failure: Secondary | ICD-10-CM | POA: Diagnosis not present

## 2024-11-15 DIAGNOSIS — M48061 Spinal stenosis, lumbar region without neurogenic claudication: Secondary | ICD-10-CM | POA: Diagnosis present

## 2024-11-15 DIAGNOSIS — R0602 Shortness of breath: Secondary | ICD-10-CM | POA: Diagnosis not present

## 2024-11-15 DIAGNOSIS — C259 Malignant neoplasm of pancreas, unspecified: Secondary | ICD-10-CM | POA: Diagnosis not present

## 2024-11-15 DIAGNOSIS — K819 Cholecystitis, unspecified: Secondary | ICD-10-CM | POA: Diagnosis not present

## 2024-11-15 LAB — I-STAT VENOUS BLOOD GAS, ED
Acid-base deficit: 2 mmol/L (ref 0.0–2.0)
Bicarbonate: 21.8 mmol/L (ref 20.0–28.0)
Calcium, Ion: 1.12 mmol/L — ABNORMAL LOW (ref 1.15–1.40)
HCT: 29 % — ABNORMAL LOW (ref 39.0–52.0)
Hemoglobin: 9.9 g/dL — ABNORMAL LOW (ref 13.0–17.0)
O2 Saturation: 98 %
Potassium: 3.7 mmol/L (ref 3.5–5.1)
Sodium: 139 mmol/L (ref 135–145)
TCO2: 23 mmol/L (ref 22–32)
pCO2, Ven: 31.5 mmHg — ABNORMAL LOW (ref 44–60)
pH, Ven: 7.448 — ABNORMAL HIGH (ref 7.25–7.43)
pO2, Ven: 100 mmHg — ABNORMAL HIGH (ref 32–45)

## 2024-11-15 LAB — BASIC METABOLIC PANEL WITH GFR
Anion gap: 9 (ref 5–15)
BUN: 36 mg/dL — ABNORMAL HIGH (ref 8–23)
CO2: 26 mmol/L (ref 22–32)
Calcium: 8.9 mg/dL (ref 8.9–10.3)
Chloride: 105 mmol/L (ref 98–111)
Creatinine, Ser: 2.14 mg/dL — ABNORMAL HIGH (ref 0.61–1.24)
GFR, Estimated: 32 mL/min — ABNORMAL LOW (ref >60)
Glucose, Bld: 326 mg/dL — ABNORMAL HIGH (ref 70–99)
Potassium: 3.6 mmol/L (ref 3.5–5.1)
Sodium: 140 mmol/L (ref 135–145)

## 2024-11-15 LAB — CBG MONITORING, ED
Glucose-Capillary: 306 mg/dL — ABNORMAL HIGH (ref 70–99)
Glucose-Capillary: 339 mg/dL — ABNORMAL HIGH (ref 70–99)
Glucose-Capillary: 326 mg/dL — ABNORMAL HIGH (ref 70–99)

## 2024-11-15 LAB — CBC
HCT: 30.6 % — ABNORMAL LOW (ref 39.0–52.0)
Hemoglobin: 10.1 g/dL — ABNORMAL LOW (ref 13.0–17.0)
MCH: 32.2 pg (ref 26.0–34.0)
MCHC: 33 g/dL (ref 30.0–36.0)
MCV: 97.5 fL (ref 80.0–100.0)
Platelets: 318 K/uL (ref 150–400)
RBC: 3.14 MIL/uL — ABNORMAL LOW (ref 4.22–5.81)
RDW: 12.9 % (ref 11.5–15.5)
WBC: 11.9 K/uL — ABNORMAL HIGH (ref 4.0–10.5)
nRBC: 0 % (ref 0.0–0.2)

## 2024-11-15 LAB — URINALYSIS, ROUTINE W REFLEX MICROSCOPIC
Bacteria, UA: NONE SEEN
Bilirubin Urine: NEGATIVE
Glucose, UA: >=500 mg/dL — AB
Ketones, ur: 5 mg/dL — AB
Leukocytes,Ua: NEGATIVE
Nitrite: NEGATIVE
Protein, ur: >=300 mg/dL — AB
Specific Gravity, Urine: 1.014 (ref 1.005–1.030)
pH: 6 (ref 5.0–8.0)

## 2024-11-15 LAB — BRAIN NATRIURETIC PEPTIDE: B Natriuretic Peptide: 3163.4 pg/mL — ABNORMAL HIGH (ref 0.0–100.0)

## 2024-11-15 LAB — I-STAT CHEM 8, ED
BUN: 37 mg/dL — ABNORMAL HIGH (ref 8–23)
Calcium, Ion: 1.19 mmol/L (ref 1.15–1.40)
Chloride: 105 mmol/L (ref 98–111)
Creatinine, Ser: 2.1 mg/dL — ABNORMAL HIGH (ref 0.61–1.24)
Glucose, Bld: 313 mg/dL — ABNORMAL HIGH (ref 70–99)
HCT: 30 % — ABNORMAL LOW (ref 39.0–52.0)
Hemoglobin: 10.2 g/dL — ABNORMAL LOW (ref 13.0–17.0)
Potassium: 3.7 mmol/L (ref 3.5–5.1)
Sodium: 141 mmol/L (ref 135–145)
TCO2: 23 mmol/L (ref 22–32)

## 2024-11-15 LAB — BETA-HYDROXYBUTYRIC ACID: Beta-Hydroxybutyric Acid: 1.09 mmol/L — ABNORMAL HIGH (ref 0.05–0.27)

## 2024-11-15 LAB — D-DIMER, QUANTITATIVE: D-Dimer, Quant: 2.39 ug{FEU}/mL — ABNORMAL HIGH (ref 0.00–0.50)

## 2024-11-15 LAB — TROPONIN I (HIGH SENSITIVITY)
Troponin I (High Sensitivity): 95 ng/L — ABNORMAL HIGH (ref <18)
Troponin I (High Sensitivity): 87 ng/L — ABNORMAL HIGH (ref <18)

## 2024-11-15 MED ORDER — MORPHINE SULFATE (PF) 2 MG/ML IV SOLN: 2.0000 mg | INTRAVENOUS | Status: DC | PRN

## 2024-11-15 MED ORDER — PANTOPRAZOLE SODIUM 40 MG IV SOLR
40.0000 mg | Freq: Once | INTRAVENOUS | Status: AC
  Administered 2024-11-15: 40 mg via INTRAVENOUS
  Filled 2024-11-15: qty 10

## 2024-11-15 MED ORDER — LORATADINE 10 MG PO TABS
10.0000 mg | ORAL_TABLET | Freq: Every day | ORAL | Status: DC
  Administered 2024-11-16 – 2024-11-17 (×2): 10 mg via ORAL
  Filled 2024-11-15 (×2): qty 1

## 2024-11-15 MED ORDER — ONDANSETRON HCL 4 MG/2ML IJ SOLN
4.0000 mg | Freq: Once | INTRAMUSCULAR | Status: AC
  Administered 2024-11-15: 4 mg via INTRAVENOUS
  Filled 2024-11-15: qty 2

## 2024-11-15 MED ORDER — HYDRALAZINE HCL 50 MG PO TABS
50.0000 mg | ORAL_TABLET | Freq: Three times a day (TID) | ORAL | Status: DC
  Administered 2024-11-16 – 2024-11-17 (×5): 50 mg via ORAL
  Filled 2024-11-15 (×5): qty 1

## 2024-11-15 MED ORDER — INSULIN ASPART 100 UNIT/ML IJ SOLN
0.0000 [IU] | Freq: Three times a day (TID) | INTRAMUSCULAR | Status: DC
  Administered 2024-11-16: 5 [IU] via SUBCUTANEOUS
  Administered 2024-11-16: 11 [IU] via SUBCUTANEOUS
  Administered 2024-11-17: 3 [IU] via SUBCUTANEOUS
  Filled 2024-11-15: qty 5
  Filled 2024-11-15: qty 1
  Filled 2024-11-15: qty 3

## 2024-11-15 MED ORDER — LACTATED RINGERS IV BOLUS
2000.0000 mL | Freq: Once | INTRAVENOUS | Status: AC
  Administered 2024-11-15: 2000 mL via INTRAVENOUS

## 2024-11-15 MED ORDER — HEPARIN (PORCINE) 25000 UT/250ML-% IV SOLN
1200.0000 [IU]/h | INTRAVENOUS | Status: AC
  Administered 2024-11-15: 1200 [IU]/h via INTRAVENOUS
  Filled 2024-11-15: qty 250

## 2024-11-15 MED ORDER — IOHEXOL 350 MG/ML SOLN
60.0000 mL | Freq: Once | INTRAVENOUS | Status: AC | PRN
  Administered 2024-11-15: 60 mL via INTRAVENOUS

## 2024-11-15 MED ORDER — INSULIN GLARGINE 100 UNIT/ML ~~LOC~~ SOLN
10.0000 [IU] | Freq: Every day | SUBCUTANEOUS | Status: DC
  Administered 2024-11-16: 10 [IU] via SUBCUTANEOUS
  Filled 2024-11-15: qty 0.1

## 2024-11-15 MED ORDER — HEPARIN SODIUM (PORCINE) 5000 UNIT/ML IJ SOLN: 4000.0000 [IU] | Freq: Once | INTRAMUSCULAR | Status: DC

## 2024-11-15 MED ORDER — FAMOTIDINE 20 MG PO TABS
20.0000 mg | ORAL_TABLET | Freq: Every day | ORAL | Status: DC
  Administered 2024-11-16: 20 mg via ORAL
  Filled 2024-11-15: qty 1

## 2024-11-15 MED ORDER — TAMSULOSIN HCL 0.4 MG PO CAPS
0.4000 mg | ORAL_CAPSULE | Freq: Every day | ORAL | Status: DC
  Administered 2024-11-16 – 2024-11-17 (×2): 0.4 mg via ORAL
  Filled 2024-11-15 (×2): qty 1

## 2024-11-15 MED ORDER — DILTIAZEM HCL 60 MG PO TABS
120.0000 mg | ORAL_TABLET | Freq: Once | ORAL | Status: AC
  Administered 2024-11-15: 120 mg via ORAL
  Filled 2024-11-15: qty 2

## 2024-11-15 MED ORDER — ONDANSETRON HCL 4 MG/2ML IJ SOLN: 4.0000 mg | Freq: Four times a day (QID) | INTRAMUSCULAR | Status: DC | PRN

## 2024-11-15 MED ORDER — FUROSEMIDE 10 MG/ML IJ SOLN
40.0000 mg | Freq: Two times a day (BID) | INTRAMUSCULAR | Status: DC
  Administered 2024-11-16: 40 mg via INTRAVENOUS
  Filled 2024-11-15: qty 4

## 2024-11-15 MED ORDER — FUROSEMIDE 10 MG/ML IJ SOLN
40.0000 mg | Freq: Once | INTRAMUSCULAR | Status: AC
  Administered 2024-11-15: 40 mg via INTRAVENOUS
  Filled 2024-11-15: qty 4

## 2024-11-15 MED ORDER — INSULIN ASPART 100 UNIT/ML IJ SOLN
0.0000 [IU] | Freq: Every day | INTRAMUSCULAR | Status: DC
  Administered 2024-11-16: 5 [IU] via SUBCUTANEOUS
  Filled 2024-11-15: qty 1

## 2024-11-15 MED ORDER — INSULIN ASPART 100 UNIT/ML IJ SOLN
5.0000 [IU] | Freq: Once | INTRAMUSCULAR | Status: AC
  Administered 2024-11-15: 5 [IU] via INTRAVENOUS
  Filled 2024-11-15: qty 5

## 2024-11-15 MED ORDER — HYDRALAZINE HCL 25 MG PO TABS
50.0000 mg | ORAL_TABLET | Freq: Once | ORAL | Status: AC
  Administered 2024-11-15: 50 mg via ORAL
  Filled 2024-11-15: qty 2

## 2024-11-15 MED ORDER — HEPARIN BOLUS VIA INFUSION
4500.0000 [IU] | Freq: Once | INTRAVENOUS | Status: AC
  Administered 2024-11-15: 4500 [IU] via INTRAVENOUS
  Filled 2024-11-15: qty 4500

## 2024-11-15 MED ORDER — DILTIAZEM HCL ER COATED BEADS 180 MG PO CP24
180.0000 mg | ORAL_CAPSULE | Freq: Every day | ORAL | Status: DC
  Administered 2024-11-16 – 2024-11-17 (×2): 180 mg via ORAL
  Filled 2024-11-15 (×2): qty 1

## 2024-11-15 MED ORDER — ONDANSETRON HCL 4 MG PO TABS: 4.0000 mg | ORAL_TABLET | Freq: Four times a day (QID) | ORAL | Status: DC | PRN

## 2024-11-15 NOTE — ED Notes (Signed)
 Per MD, pt to only receive 1000 ml of the ordered 2000 ml LR.

## 2024-11-15 NOTE — H&P (Signed)
 History and Physical    Patient: Colin Keller FMW:[id number] DOB: [birth year] DOA: 11/15/2024 DOS: the patient was seen and examined on 11/15/2024 PCP: McDiarmid, Krystal BIRCH, MD  Patient coming from: Home  Chief Complaint:  Chief Complaint  Patient presents with   Hyperglycemia   SOB   HPI: Colin Keller is a 68 y.o. male with medical history significant of chronic diastolic heart failure, insulin -dependent diabetes, coronary artery disease previous DKA, history of CABG, hyperlipidemia, essential hypertension, peripheral vascular disease, history of subclavian stenosis on the right, chronic low back pain with spinal stenosis, chronic tobacco abuse, who presented to the ER with elevated blood sugar, exertional dyspnea and concern for possible ketoacidosis.  Patient was seen and evaluated.  He has significant hypertensive urgency with initial blood pressure 223/92, has some leukocytosis anemia AKI on CKD 3 as well as CT findings of small pleural effusion and pulmonary embolism.  Patient initiated on heparin .  No evidence of DKA.  Being admitted for further evaluation and treatment.  Review of Systems: As mentioned in the history of present illness. All other systems reviewed and are negative. Past Medical History:  Diagnosis Date   Acute diastolic heart failure (HCC) 08/18/2018   AKI (acute kidney injury) 10/04/2023   Atherosclerosis of both carotid arteries 08/18/2020   Carotid dopplers 08/2019 40-59% bilateral stenosis and right subclavian stenosis   Bilateral carotid artery stenosis 08/18/2020   Right Carotid: Velocities in the right ICA are consistent with a 60-79%                 stenosis. Non-hemodynamically significant plaque <50% noted in                 the CCA. The ECA appears >50% stenosed. Proximal subclavian                 artery dilatation with elevated and turbulent flow just past the                 narrowing, measuring 1.6 cm.   Left Carotid: Velocities in the left ICA are    CHF  (congestive heart failure) (HCC)    Chronic congestive heart failure (HCC) 10/04/2023   Transthoracic echocardiogram 10/08/2018: LVEF is approximately 50% with hypokinesis of the lateral (base/mid) and basal inferior walls T     Chronic left shoulder pain 03/18/2021   Chronic low back pain without sciatica 10/16/2008   H/o chronic LBP with h/o lumbar disc herniation and intermittent radicular pain  Chronic pain, on stable doses of Norco 10/325 TID. MRI in 2003 showing SMALL CENTRAL DISC HERNIATION AT L4-5.  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      CKD (chronic kidney disease), stage III (HCC)    Coronary artery disease    Coronary artery disease involving native heart with angina pectoris, unspecified vessel or lesion type 08/10/2023   COVID 10/04/2023   Diabetes mellitus    DKA (diabetic ketoacidosis) (HCC) 10/03/2023   Dyspnea    Fall 10/04/2023   GERD (gastroesophageal reflux disease)    Hx of CABG 08/23/2018   LIMA to LAD, RIMA to RCA, SVG to OM3, EVH via right thigh   Hyperlipidemia    Hypertension    Hypertension associated with diabetes (HCC) 12/22/2008   Qualifier: Diagnosis of  By: Edrick MD, Susan     Hypertensive nephropathy 08/18/2020   Insulin  dependent type 2 diabetes mellitus (HCC) 12/22/2020   Left ventricular dysfunction 08/18/2020   TTE (  [id number]): LVEF is approximately 50% with hypokinesis of the lateral (base/mid) and basal inferior walls The cavity size was normal.      Long term (current) use of antithrombotics/antiplatelets 09/21/2022   Planned 2-months DAPT for DES for PAD on 09/21/2022.  Planned 6 months therapy. End 03/23/23.   Peripheral artery disease 08/31/2021   [facility] Cardiology Vascular Study lower extremities 08/30/21:            Today's ABI  Today's TBI   Previous ABIPrevious TBI  +-------+-----------+-----------+------------+------------+  Right  0.76       0.31       1.03                       +-------+-----------+-----------+------------+------------+  Left   0.63       0.20       0.95                      +-------+-----------+---------   Proteinuria 08/18/2020   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   LIMA to LAD, RIMA to RCA, SVG to OM3, EVH via right thigh   Sleeping difficulties 09/08/2019   Subclavian artery stenosis, right 08/18/2020   Carotid dopplers 08/2019 40-59% bilateral stenosis and right subclavian stenosis   Tobacco abuse    Tobacco abuse disorder    Qualifier: Diagnosis of  By: Edrick MD, Devere     Past Surgical History:  Procedure Laterality Date   ABDOMINAL AORTOGRAM W/LOWER EXTREMITY N/A 09/21/2022   Procedure: ABDOMINAL AORTOGRAM W/LOWER EXTREMITY;  Surgeon: Darron Deatrice LABOR, MD;  Location: [facility] INVASIVE CV LAB;  Service: Cardiovascular;  Laterality: N/A;   CORONARY ARTERY BYPASS GRAFT N/A 08/23/2018   Procedure: CORONARY ARTERY BYPASS GRAFTING (CABG) x 3; -Left Internal Mammary Artery to Left Anterior Descending Artery, -Right Internal Mammary Artery to Right Coronary Artery, -Saphenous Vein Graft to Obtuse Marginal;  ENDOSCOPIC HARVEST GREATER SAPHENOUS VEIN  -Right Thigh;  Surgeon: Dusty Sudie DEL, MD;  Location: [facility] OR;  Service: Open Heart Surgery;  Laterality: N/A;   CORONARY PRESSURE/FFR STUDY N/A 08/20/2018   Procedure: INTRAVASCULAR PRESSURE WIRE/FFR STUDY;  Surgeon: Wonda Sharper, MD;  Location: [facility] INVASIVE CV LAB;  Service: Cardiovascular;  Laterality: N/A;   LEFT HEART CATH AND CORONARY ANGIOGRAPHY N/A 08/20/2018   Procedure: LEFT HEART CATH AND CORONARY ANGIOGRAPHY;  Surgeon: Wonda Sharper, MD;  Location: [facility] INVASIVE CV LAB;  Service: Cardiovascular;  Laterality: N/A;   PERIPHERAL VASCULAR ATHERECTOMY Left 09/21/2022   Procedure: PERIPHERAL VASCULAR ATHERECTOMY;  Surgeon: Darron Deatrice LABOR, MD;  Location: [facility] INVASIVE CV LAB;  Service: Cardiovascular;  Laterality: Left;  SFA   PERIPHERAL VASCULAR BALLOON ANGIOPLASTY Left 09/21/2022   Procedure: PERIPHERAL  VASCULAR BALLOON ANGIOPLASTY;  Surgeon: Darron Deatrice LABOR, MD;  Location: [facility] INVASIVE CV LAB;  Service: Cardiovascular;  Laterality: Left;  SFA   TEE WITHOUT CARDIOVERSION N/A 08/23/2018   Procedure: TRANSESOPHAGEAL ECHOCARDIOGRAM (TEE);  Surgeon: Dusty Sudie DEL, MD;  Location: [facility] OR;  Service: Open Heart Surgery;  Laterality: N/A;   Social History:  reports that he has been smoking cigarettes. He started smoking about 54 years ago. He has a 48 pack-year smoking history. He has never used smokeless tobacco. He reports that he does not drink alcohol and does not use drugs.  Allergies  Allergen Reactions   Amlodipine  Other (See Comments)    Leg swelling   Empagliflozin  Other (See Comments)    Euglycemic DKA   Amitriptyline  Other (See Comments)  Urinary retention   Lisinopril  Other (See Comments)    Hyperkalemia    Nortriptyline  Hcl Other (See Comments)    Vivid / bad dreams   Statins     Aches in Joints    Simvastatin  Other (See Comments)    Arthralgias    Family History  Problem Relation Age of Onset   Heart disease Mother    Diabetes Mother    Heart failure Mother    Heart disease Father    Sudden death Brother    Drug abuse Son     Prior to Admission medications   Medication Sig Start Date End Date Taking? Authorizing Provider  aspirin  81 MG tablet Take 81 mg by mouth daily.    [provider]  carvedilol  (COREG ) 25 MG tablet TAKE 1 TABLET BY MOUTH TWICE DAILY WITH A MEAL 04/24/24   Darron Deatrice LABOR, MD  cetirizine (ZYRTEC) 10 MG chewable tablet Chew 10 mg by mouth daily.    [provider]  Continuous Glucose Sensor (FREESTYLE LIBRE 3 PLUS SENSOR) MISC Change sensor every 15 days. 10/08/24   McDiarmid, Todd D, MD  Cyanocobalamin (VITAMIN B 12 PO) Take 1 tablet by mouth daily. Patient not taking: Reported on 11/12/2024    [provider]  diltiazem  (CARTIA  XT) 120 MG 24 hr capsule TAKE 1 CAPSULE BY MOUTH DAILY 06/12/24   McDiarmid, Krystal BIRCH, MD   Evolocumab  (REPATHA  SURECLICK) 140 MG/ML SOAJ Inject 140 mg into the skin every 14 (fourteen) days. 03/12/24   Darron Deatrice LABOR, MD  famotidine  (PEPCID ) 20 MG tablet Take 20 mg by mouth daily.    [provider]  Glucagon  (BAQSIMI  TWO PACK) 3 MG/DOSE POWD Use as directed PRN low glucose 10/30/24   McDiarmid, Krystal BIRCH, MD  hydrALAZINE  (APRESOLINE ) 50 MG tablet Take 1 tablet (50 mg total) by mouth 3 (three) times daily. 08/22/24 08/17/25  McDiarmid, Krystal BIRCH, MD  hydrochlorothiazide  (HYDRODIURIL ) 12.5 MG tablet Take 1 tablet (12.5 mg total) by mouth daily. 10/30/24   McDiarmid, Krystal BIRCH, MD  HYDROcodone -acetaminophen  (NORCO) 10-325 MG tablet TAKE 1 TABLET BY MOUTH TWICE DAILY AS NEEDED 11/06/24   McDiarmid, Krystal BIRCH, MD  Insulin  Glargine (BASAGLAR  KWIKPEN) 100 UNIT/ML DIAL AND INJECT 20 UNITS UNDER THE SKIN DAILY. Patient taking differently: 10-12 Units daily. 08/19/24   McDiarmid, Krystal BIRCH, MD  insulin  lispro (HUMALOG ) 100 UNIT/ML injection Inject 0.03-0.05 mLs (3-5 Units total) into the skin 3 (three) times daily with meals. 3-4 units prior to breakfast, 4-5 units prior to evening meal 08/08/24 02/04/25  McDiarmid, Krystal BIRCH, MD  metFORMIN  (GLUCOPHAGE ) 500 MG tablet Take 2 tablets (1,000 mg total) by mouth 2 (two) times daily with a meal. TAKE 2 TABLETS BY MOUTH TWICE DAILY WITH A MEAL 10/23/23 11/12/24  McDiarmid, Krystal BIRCH, MD  promethazine  (PHENERGAN ) 25 MG tablet Take 1 tablet (25 mg total) by mouth every 6 (six) hours as needed for nausea or vomiting. 09/24/24   McDiarmid, Krystal BIRCH, MD  tamsulosin  (FLOMAX ) 0.4 MG CAPS capsule Take 1 capsule (0.4 mg total) by mouth daily. 02/20/24   McDiarmid, Krystal BIRCH, MD    Physical Exam: Vitals:   11/15/24 1855 11/15/24 2011 11/15/24 2051 11/15/24 2127  BP: (!) 202/94 (!) 205/92 (!) 201/86 (!) 180/74  Pulse: 94 95 94 93  Resp: 18 18 20 15   Temp:   99 F (37.2 C)   TempSrc:   Oral   SpO2: 98% 100% 97% 97%  Weight:      Height:  Constitutional: Chronically ill  looking no distress NAD, calm, comfortable Eyes: PERRL, lids and conjunctivae normal ENMT: Mucous membranes are moist. Posterior pharynx clear of any exudate or lesions.Normal dentition.  Neck: normal, supple, no masses, no thyromegaly Respiratory: Decreased air entry bilaterally with some basal crackles normal respiratory effort. No accessory muscle use.  Cardiovascular: Regular rate and rhythm, no murmurs / rubs / gallops. No extremity edema. 2+ pedal pulses. No carotid bruits.  Abdomen: no tenderness, no masses palpated. No hepatosplenomegaly. Bowel sounds positive.  Musculoskeletal: Good range of motion, no joint swelling or tenderness, Skin: no rashes, lesions, ulcers. No induration Neurologic: CN 2-12 grossly intact. Sensation intact, DTR normal. Strength 5/5 in all 4.  Psychiatric: Normal judgment and insight. Alert and oriented x 3. Normal mood  Data Reviewed:  Blood pressure is 202/94, pulse 94 respiratory rate 18, oxygen sat 98% on2 L.  BNP is 3163, white count 11.9 hemoglobin 9.9 creatinine 2.1, glucose 339.  Venous pH is 7.448.  BUN is 37.  Initial troponin 95 and then 87.  Urinalysis shows glucosuria otherwise no other findings.  Beta-hydroxybutyrate acid 1.09.  Chest x-ray showed no acute findings.  CT angiogram of the chest showed posterior basilar segmental artery PE in the left lower lobe, also new high grade stenosis of the proximal right subclavian artery, moderate left pleural effusion small right pleural effusion as well as pulmonary nodules including a 5 mm left upper nodule and 80 mm right lower lobe nodule  Assessment and Plan:  #1 exertional dyspnea: Most likely multifactorial.  Patient has bilateral pleural effusion.  Also small pulmonary embolism.  Will treat underlying causes.  #2 pulmonary embolism: Patient initiated on IV heparin  drip.  Will continue the drip.  May switch to Eliquis  #3 chronic diastolic dysfunction: Will confirm and resume home regimen  #4  bilateral pleural effusions: Most likely secondary to CHF.  Will try diuresis if symptoms worsen and no improvement may need to have some thoracentesis.  #5 insulin -dependent diabetes type 2: Initiate sliding scale and long-acting insulin .  Blood sugars markedly elevated in the 400s initially.  Currently getting better once insulin  and hydration  #6 peripheral vascular disease: Chronic on treatment.  Patient has known subclavian artery stenosis defer to cardiology  #7 hyperlipidemia: Patient has statin allergies.  Not on statin .  Defer to cardiology  #8 chronic kidney disease stage IIIb: Continue monitoring.  Appears to be close to baseline  #9 malignant hypertension: Blood pressure appears to be markedly elevated.  Resume home regimen and add IV medications    Advance Care Planning:   Code Status: Full Code   Consults: None  Family Communication: None family at bedside  Severity of Illness: The appropriate patient status for this patient is INPATIENT. Inpatient status is judged to be reasonable and necessary in order to provide the required intensity of service to ensure the patient's safety. The patient's presenting symptoms, physical exam findings, and initial radiographic and laboratory data in the context of their chronic comorbidities is felt to place them at high risk for further clinical deterioration. Furthermore, it is not anticipated that the patient will be medically stable for discharge from the hospital within 2 midnights of admission.   * I certify that at the point of admission it is my clinical judgment that the patient will require inpatient hospital care spanning beyond 2 midnights from the point of admission due to high intensity of service, high risk for further deterioration and high frequency of surveillance required.*  AuthorBETHA SIM KNOLL, MD 11/15/2024 9:34 PM  For on call review [email or web address].

## 2024-11-15 NOTE — Progress Notes (Signed)
 PHARMACY - ANTICOAGULATION CONSULT NOTE  Pharmacy Consult for heparin  Indication: pulmonary embolus  Allergies  Allergen Reactions   Amlodipine  Other (See Comments)    Leg swelling   Empagliflozin  Other (See Comments)    Euglycemic DKA   Amitriptyline  Other (See Comments)    Urinary retention   Lisinopril  Other (See Comments)    Hyperkalemia    Nortriptyline  Hcl Other (See Comments)    Vivid / bad dreams   Statins     Aches in Joints    Simvastatin  Other (See Comments)    Arthralgias    Patient Measurements: Height: 5' 10 (177.8 cm) Weight: 69.9 kg (154 lb) IBW/kg (Calculated) : 73 HEPARIN  DW (KG): 69.9  Vital Signs: Temp: 99 F (37.2 C) (12/05 2051) Temp Source: Oral (12/05 2051) BP: 201/86 (12/05 2051) Pulse Rate: 94 (12/05 2051)  Labs: Recent Labs    11/15/24 1643 11/15/24 1647 11/15/24 1708 11/15/24 1755 11/15/24 1908  HGB 10.1* 10.2*  --  9.9*  --   HCT 30.6* 30.0*  --  29.0*  --   PLT 318  --   --   --   --   CREATININE 2.14* 2.10*  --   --   --   TROPONINIHS  --   --  95*  --  87*    Estimated Creatinine Clearance: 32.4 mL/min (A) (by C-G formula based on SCr of 2.1 mg/dL (H)).   Medical History: Past Medical History:  Diagnosis Date   Acute diastolic heart failure (HCC) 08/18/2018   AKI (acute kidney injury) 10/04/2023   Atherosclerosis of both carotid arteries 08/18/2020   Carotid dopplers 08/2019 40-59% bilateral stenosis and right subclavian stenosis   Bilateral carotid artery stenosis 08/18/2020   Right Carotid: Velocities in the right ICA are consistent with a 60-79%                 stenosis. Non-hemodynamically significant plaque <50% noted in                 the CCA. The ECA appears >50% stenosed. Proximal subclavian                 artery dilatation with elevated and turbulent flow just past the                 narrowing, measuring 1.6 cm.   Left Carotid: Velocities in the left ICA are    CHF (congestive heart failure) (HCC)    Chronic  congestive heart failure (HCC) 10/04/2023   Transthoracic echocardiogram 10/08/2018: LVEF is approximately 50% with hypokinesis of the lateral (base/mid) and basal inferior walls T     Chronic left shoulder pain 03/18/2021   Chronic low back pain without sciatica 10/16/2008   H/o chronic LBP with h/o lumbar disc herniation and intermittent radicular pain  Chronic pain, on stable doses of Norco 10/325 TID. MRI in 2003 showing SMALL CENTRAL DISC HERNIATION AT L4-5.  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      CKD (chronic kidney disease), stage III (HCC)    Coronary artery disease    Coronary artery disease involving native heart with angina pectoris, unspecified vessel or lesion type 08/10/2023   COVID 10/04/2023   Diabetes mellitus    DKA (diabetic ketoacidosis) (HCC) 10/03/2023   Dyspnea    Fall 10/04/2023   GERD (gastroesophageal reflux disease)    Hx of CABG 08/23/2018   LIMA to LAD, RIMA to RCA, SVG to OM3, EVH via  right thigh   Hyperlipidemia    Hypertension    Hypertension associated with diabetes (HCC) 12/22/2008   Qualifier: Diagnosis of  By: Edrick MD, Devere     Hypertensive nephropathy 08/18/2020   Insulin  dependent type 2 diabetes mellitus (HCC) 12/22/2020   Left ventricular dysfunction 08/18/2020   TTE ([id number]): LVEF is approximately 50% with hypokinesis of the lateral (base/mid) and basal inferior walls The cavity size was normal.      Long term (current) use of antithrombotics/antiplatelets 09/21/2022   Planned 62-months DAPT for DES for PAD on 09/21/2022.  Planned 6 months therapy. End 03/23/23.   Peripheral artery disease 08/31/2021   [facility] Cardiology Vascular Study lower extremities 08/30/21:            Today's ABI  Today's TBI   Previous ABIPrevious TBI  +-------+-----------+-----------+------------+------------+  Right  0.76       0.31       1.03                      +-------+-----------+-----------+------------+------------+  Left    0.63       0.20       0.95                      +-------+-----------+---------   Proteinuria 08/18/2020   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   LIMA to LAD, RIMA to RCA, SVG to OM3, EVH via right thigh   Sleeping difficulties 09/08/2019   Subclavian artery stenosis, right 08/18/2020   Carotid dopplers 08/2019 40-59% bilateral stenosis and right subclavian stenosis   Tobacco abuse    Tobacco abuse disorder    Qualifier: Diagnosis of  By: Edrick MD, Devere      Assessment: 33 YOM presenting with SOB, CT angio shows faint nonocclusive PE, he is not on anticoagulation PTA,   Goal of Therapy:  Heparin  level 0.3-0.7 units/ml Monitor platelets by anticoagulation protocol: Yes   Plan:  Heparin  4500 units IV x 1, and gtt at 1200 units/hr F/u 8 hour heparin  level F/u long term [facility] plan  Dorn Poot, PharmD, [facility] Clinical Pharmacist ED Pharmacist Phone # [phone] 11/15/2024 9:23 PM

## 2024-11-15 NOTE — ED Triage Notes (Signed)
 POV pt concerned for Ketoacidotics, pt stated having blood sugar in the 300's nausea and  overall ill feeling. Pt report shortness of breath.   Aox4, ambulatory at triage, report weakness

## 2024-11-15 NOTE — ED Provider Notes (Signed)
  EMERGENCY DEPARTMENT AT [facility] Provider Note   CSN: [id number] Arrival date & time: 11/15/24  1611     Patient presents with: Hyperglycemia and SOB   Colin Keller is a 68 y.o. male.  History of CAD, status post three-vessel CABG, HLD, CKD, type II DM, carotid artery disease, PAD with bilateral SFA and popliteal disease presenting with hyperglycemia, concern for DKA.  History per patient and patient family.  Endorses nausea and vomiting, shortness of breath.  Ambulatory, however endorses weakness.  History per patient, patient daughter at bedside.  Reports starting yesterday, have been having worsening epigastric pain with burning sensation in his chest.  He has had multiple episodes of vomiting, tried to take a Phenergan  but was unable to tolerate.  However, was able to tolerate if he took it with Sprite.  Endorses symptoms are primarily worse when lying flat, or if he is walking.  He tried to take a famotidine  however this did not help.  He denies chest pain, but does endorse worsening shortness of breath.  Endorses that he did take his insulin  this morning, but none of his other medications, including his blood pressure medications.  He is also concerned because his blood pressure has been going pretty high.  Denies any fever or chills, recent illnesses.  He is primarily concerned that he may be developing DKA, as he has a history of developing DKA with history of type 2 diabetes.  He is also worried about his acid reflux at this time.  Versus that he does have some swelling present to his right lower extremity with a history of PAD, and states that his swelling is currently at its baseline.  {Add pertinent medical, surgical, social history, OB history to HPI:32947}  Hyperglycemia      Prior to Admission medications   Medication Sig Start Date End Date Taking? Authorizing Provider  aspirin  81 MG tablet Take 81 mg by mouth daily.    [provider]   carvedilol  (COREG ) 25 MG tablet TAKE 1 TABLET BY MOUTH TWICE DAILY WITH A MEAL 04/24/24   Darron Deatrice LABOR, MD  cetirizine (ZYRTEC) 10 MG chewable tablet Chew 10 mg by mouth daily.    [provider]  Continuous Glucose Sensor (FREESTYLE LIBRE 3 PLUS SENSOR) MISC Change sensor every 15 days. 10/08/24   McDiarmid, Todd D, MD  Cyanocobalamin (VITAMIN B 12 PO) Take 1 tablet by mouth daily. Patient not taking: Reported on 11/12/2024    [provider]  diltiazem  (CARTIA  XT) 120 MG 24 hr capsule TAKE 1 CAPSULE BY MOUTH DAILY 06/12/24   McDiarmid, Krystal BIRCH, MD  Evolocumab  (REPATHA  SURECLICK) 140 MG/ML SOAJ Inject 140 mg into the skin every 14 (fourteen) days. 03/12/24   Darron Deatrice LABOR, MD  famotidine  (PEPCID ) 20 MG tablet Take 20 mg by mouth daily.    [provider]  Glucagon  (BAQSIMI  TWO PACK) 3 MG/DOSE POWD Use as directed PRN low glucose 10/30/24   McDiarmid, Krystal BIRCH, MD  hydrALAZINE  (APRESOLINE ) 50 MG tablet Take 1 tablet (50 mg total) by mouth 3 (three) times daily. 08/22/24 08/17/25  McDiarmid, Krystal BIRCH, MD  hydrochlorothiazide  (HYDRODIURIL ) 12.5 MG tablet Take 1 tablet (12.5 mg total) by mouth daily. 10/30/24   McDiarmid, Krystal BIRCH, MD  HYDROcodone -acetaminophen  (NORCO) 10-325 MG tablet TAKE 1 TABLET BY MOUTH TWICE DAILY AS NEEDED 11/06/24   McDiarmid, Krystal BIRCH, MD  Insulin  Glargine (BASAGLAR  KWIKPEN) 100 UNIT/ML DIAL AND INJECT 20 UNITS UNDER THE SKIN DAILY.  Patient taking differently: 10-12 Units daily. 08/19/24   McDiarmid, Krystal BIRCH, MD  insulin  lispro (HUMALOG ) 100 UNIT/ML injection Inject 0.03-0.05 mLs (3-5 Units total) into the skin 3 (three) times daily with meals. 3-4 units prior to breakfast, 4-5 units prior to evening meal 08/08/24 02/04/25  McDiarmid, Krystal BIRCH, MD  metFORMIN  (GLUCOPHAGE ) 500 MG tablet Take 2 tablets (1,000 mg total) by mouth 2 (two) times daily with a meal. TAKE 2 TABLETS BY MOUTH TWICE DAILY WITH A MEAL 10/23/23 11/12/24  McDiarmid, Krystal BIRCH, MD  promethazine   (PHENERGAN ) 25 MG tablet Take 1 tablet (25 mg total) by mouth every 6 (six) hours as needed for nausea or vomiting. 09/24/24   McDiarmid, Krystal BIRCH, MD  tamsulosin  (FLOMAX ) 0.4 MG CAPS capsule Take 1 capsule (0.4 mg total) by mouth daily. 02/20/24   McDiarmid, Krystal BIRCH, MD    Allergies: Amlodipine , Empagliflozin , Amitriptyline , Lisinopril , Nortriptyline  hcl, Statins, and Simvastatin     Review of Systems  Updated Vital Signs BP (!) 205/94 (BP Location: Right Arm)   Pulse 89   Temp 97.8 F (36.6 C)   Resp 17   Ht 5' 10 (1.778 m)   Wt 69.9 kg   SpO2 98%   BMI 22.10 kg/m   Physical Exam Vitals and nursing note reviewed.  Constitutional:      General: He is not in acute distress.    Appearance: He is well-developed.  HENT:     Head: Normocephalic and atraumatic.     Mouth/Throat:     Mouth: Mucous membranes are dry.     Pharynx: Oropharynx is clear.  Eyes:     Conjunctiva/sclera: Conjunctivae normal.  Cardiovascular:     Rate and Rhythm: Normal rate and regular rhythm.     Pulses: Normal pulses.     Heart sounds: Normal heart sounds. No murmur heard.    No gallop.  Pulmonary:     Effort: Pulmonary effort is normal. No respiratory distress.     Breath sounds: Normal breath sounds.  Abdominal:     General: Abdomen is flat. There is no distension.     Palpations: Abdomen is soft.     Tenderness: There is no abdominal tenderness.  Musculoskeletal:        General: Swelling present.     Cervical back: Normal range of motion and neck supple. No rigidity.     Comments: 2+ pitting edema right lower extremity in comparison to the left.  Patient states that this is his baseline.  Skin:    General: Skin is warm and dry.     Capillary Refill: Capillary refill takes less than 2 seconds.  Neurological:     General: No focal deficit present.     Mental Status: He is alert and oriented to person, place, and time.  Psychiatric:        Mood and Affect: Mood normal.     (all labs ordered  are listed, but only abnormal results are displayed) Labs Reviewed  CBG MONITORING, ED - Abnormal; Notable for the following components:      Result Value   Glucose-Capillary 306 (*)    All other components within normal limits  I-STAT CHEM 8, ED - Abnormal; Notable for the following components:   BUN 37 (*)    Creatinine, Ser 2.10 (*)    Glucose, Bld 313 (*)    Hemoglobin 10.2 (*)    HCT 30.0 (*)    All other components within normal limits  CBC  URINALYSIS, ROUTINE W  REFLEX MICROSCOPIC  BASIC METABOLIC PANEL WITH GFR  BRAIN NATRIURETIC PEPTIDE  CBG MONITORING, ED    EKG: None  Radiology: No results found.  {Document cardiac monitor, telemetry assessment procedure when appropriate:32947} Procedures   Medications Ordered in the ED - No data to display    {Click here for ABCD2, HEART and other calculators REFRESH Note before signing:1}                              Medical Decision Making Amount and/or Complexity of Data Reviewed Labs: ordered. Radiology: ordered.  Risk Prescription drug management.   ***  {Document critical care time when appropriate  Document review of labs and clinical decision tools ie CHADS2VASC2, etc  Document your independent review of radiology images and any outside records  Document your discussion with family members, caretakers and with consultants  Document social determinants of health affecting pt's care  Document your decision making why or why not admission, treatments were needed:32947:::1}   Final diagnoses:  None    ED Discharge Orders     None

## 2024-11-16 ENCOUNTER — Inpatient Hospital Stay (HOSPITAL_COMMUNITY)

## 2024-11-16 ENCOUNTER — Other Ambulatory Visit (HOSPITAL_COMMUNITY): Payer: Self-pay

## 2024-11-16 DIAGNOSIS — R0609 Other forms of dyspnea: Secondary | ICD-10-CM | POA: Diagnosis not present

## 2024-11-16 LAB — GLUCOSE, CAPILLARY
Glucose-Capillary: 107 mg/dL — ABNORMAL HIGH (ref 70–99)
Glucose-Capillary: 141 mg/dL — ABNORMAL HIGH (ref 70–99)
Glucose-Capillary: 247 mg/dL — ABNORMAL HIGH (ref 70–99)
Glucose-Capillary: 325 mg/dL — ABNORMAL HIGH (ref 70–99)
Glucose-Capillary: 367 mg/dL — ABNORMAL HIGH (ref 70–99)

## 2024-11-16 LAB — CBC
HCT: 26.2 % — ABNORMAL LOW (ref 39.0–52.0)
Hemoglobin: 8.9 g/dL — ABNORMAL LOW (ref 13.0–17.0)
MCH: 32.6 pg (ref 26.0–34.0)
MCHC: 34 g/dL (ref 30.0–36.0)
MCV: 96 fL (ref 80.0–100.0)
Platelets: 268 K/uL (ref 150–400)
RBC: 2.73 MIL/uL — ABNORMAL LOW (ref 4.22–5.81)
RDW: 13.2 % (ref 11.5–15.5)
WBC: 13.6 K/uL — ABNORMAL HIGH (ref 4.0–10.5)
nRBC: 0 % (ref 0.0–0.2)

## 2024-11-16 LAB — HEMOGLOBIN A1C
Hgb A1c MFr Bld: 9.6 % — ABNORMAL HIGH (ref 4.8–5.6)
Mean Plasma Glucose: 228.82 mg/dL

## 2024-11-16 LAB — ECHOCARDIOGRAM COMPLETE
AR max vel: 2.58 cm2
AV Peak grad: 3.1 mmHg
Ao pk vel: 0.89 m/s
Area-P 1/2: 4.31 cm2
Height: 70 in
S' Lateral: 3.8 cm
Weight: 2464 [oz_av]

## 2024-11-16 LAB — COMPREHENSIVE METABOLIC PANEL WITH GFR
ALT: 8 U/L (ref 0–44)
AST: 11 U/L — ABNORMAL LOW (ref 15–41)
Albumin: 2.3 g/dL — ABNORMAL LOW (ref 3.5–5.0)
Alkaline Phosphatase: 76 U/L (ref 38–126)
Anion gap: 12 (ref 5–15)
BUN: 36 mg/dL — ABNORMAL HIGH (ref 8–23)
CO2: 21 mmol/L — ABNORMAL LOW (ref 22–32)
Calcium: 8.1 mg/dL — ABNORMAL LOW (ref 8.9–10.3)
Chloride: 104 mmol/L (ref 98–111)
Creatinine, Ser: 2.27 mg/dL — ABNORMAL HIGH (ref 0.61–1.24)
GFR, Estimated: 30 mL/min — ABNORMAL LOW (ref >60)
Glucose, Bld: 343 mg/dL — ABNORMAL HIGH (ref 70–99)
Potassium: 3.6 mmol/L (ref 3.5–5.1)
Sodium: 137 mmol/L (ref 135–145)
Total Bilirubin: 0.7 mg/dL (ref 0.0–1.2)
Total Protein: 5.6 g/dL — ABNORMAL LOW (ref 6.5–8.1)

## 2024-11-16 LAB — HIV ANTIBODY (ROUTINE TESTING W REFLEX): HIV Screen 4th Generation wRfx: NONREACTIVE

## 2024-11-16 LAB — HEPARIN LEVEL (UNFRACTIONATED): Heparin Unfractionated: 0.33 [IU]/mL (ref 0.30–0.70)

## 2024-11-16 MED ORDER — APIXABAN 5 MG PO TABS: 5.0000 mg | ORAL_TABLET | Freq: Two times a day (BID) | ORAL | Status: DC

## 2024-11-16 MED ORDER — APIXABAN 5 MG PO TABS
10.0000 mg | ORAL_TABLET | Freq: Two times a day (BID) | ORAL | Status: DC
  Administered 2024-11-16 – 2024-11-17 (×3): 10 mg via ORAL
  Filled 2024-11-16 (×3): qty 2

## 2024-11-16 MED ORDER — APIXABAN 5 MG PO TABS
5.0000 mg | ORAL_TABLET | Freq: Two times a day (BID) | ORAL | 2 refills | Status: DC
  Filled 2024-11-16: qty 60, 30d supply, fill #0

## 2024-11-16 MED ORDER — INSULIN GLARGINE 100 UNIT/ML ~~LOC~~ SOLN
10.0000 [IU] | Freq: Two times a day (BID) | SUBCUTANEOUS | Status: DC
  Administered 2024-11-16: 10 [IU] via SUBCUTANEOUS
  Filled 2024-11-16 (×2): qty 0.1

## 2024-11-16 MED ORDER — CARVEDILOL 6.25 MG PO TABS
6.2500 mg | ORAL_TABLET | Freq: Two times a day (BID) | ORAL | Status: DC
  Administered 2024-11-16 – 2024-11-17 (×2): 6.25 mg via ORAL
  Filled 2024-11-16 (×2): qty 1

## 2024-11-16 MED ORDER — PANTOPRAZOLE SODIUM 40 MG PO TBEC
40.0000 mg | DELAYED_RELEASE_TABLET | Freq: Every day | ORAL | Status: DC
  Administered 2024-11-16 – 2024-11-17 (×2): 40 mg via ORAL
  Filled 2024-11-16 (×2): qty 1

## 2024-11-16 MED ORDER — FUROSEMIDE 40 MG PO TABS
40.0000 mg | ORAL_TABLET | Freq: Every day | ORAL | Status: DC
  Administered 2024-11-17: 40 mg via ORAL
  Filled 2024-11-16: qty 1

## 2024-11-16 MED ORDER — HYDROCODONE-ACETAMINOPHEN 10-325 MG PO TABS
1.0000 | ORAL_TABLET | Freq: Three times a day (TID) | ORAL | Status: DC | PRN
  Administered 2024-11-16 (×2): 1 via ORAL
  Filled 2024-11-16 (×2): qty 1

## 2024-11-16 MED ORDER — APIXABAN (ELIQUIS) VTE STARTER PACK (10MG AND 5MG)
ORAL_TABLET | ORAL | 0 refills | Status: DC
  Filled 2024-11-16: qty 74, 30d supply, fill #0

## 2024-11-16 MED ORDER — ALUM & MAG HYDROXIDE-SIMETH 200-200-20 MG/5ML PO SUSP: 30.0000 mL | Freq: Four times a day (QID) | ORAL | Status: DC | PRN

## 2024-11-16 NOTE — Progress Notes (Addendum)
 PHARMACY - ANTICOAGULATION CONSULT NOTE  Pharmacy Consult for heparin  Indication: pulmonary embolus  Allergies  Allergen Reactions   Amlodipine  Other (See Comments)    Leg swelling   Empagliflozin  Other (See Comments)    Euglycemic DKA   Amitriptyline  Other (See Comments)    Urinary retention   Lisinopril  Other (See Comments)    Hyperkalemia    Nortriptyline  Hcl Other (See Comments)    Vivid / bad dreams   Statins     Aches in Joints    Simvastatin  Other (See Comments)    Arthralgias    Patient Measurements: Height: 5' 10 (177.8 cm) Weight: 69.9 kg (154 lb) IBW/kg (Calculated) : 73 HEPARIN  DW (KG): 69.9  Vital Signs: Temp: 98.5 F (36.9 C) (12/06 0426) Temp Source: Oral (12/06 0426) BP: 159/86 (12/06 0426) Pulse Rate: 75 (12/06 0426)  Labs: Recent Labs    11/15/24 1643 11/15/24 1647 11/15/24 1708 11/15/24 1755 11/15/24 1908 11/16/24 0605  HGB 10.1* 10.2*  --  9.9*  --  8.9*  HCT 30.6* 30.0*  --  29.0*  --  26.2*  PLT 318  --   --   --   --  268  HEPARINUNFRC  --   --   --   --   --  0.33  CREATININE 2.14* 2.10*  --   --   --  2.27*  TROPONINIHS  --   --  95*  --  87*  --     Estimated Creatinine Clearance: 29.9 mL/min (A) (by C-G formula based on SCr of 2.27 mg/dL (H)).   Medical History: Past Medical History:  Diagnosis Date   Acute diastolic heart failure (HCC) 08/18/2018   AKI (acute kidney injury) 10/04/2023   Atherosclerosis of both carotid arteries 08/18/2020   Carotid dopplers 08/2019 40-59% bilateral stenosis and right subclavian stenosis   Bilateral carotid artery stenosis 08/18/2020   Right Carotid: Velocities in the right ICA are consistent with a 60-79%                 stenosis. Non-hemodynamically significant plaque <50% noted in                 the CCA. The ECA appears >50% stenosed. Proximal subclavian                 artery dilatation with elevated and turbulent flow just past the                 narrowing, measuring 1.6 cm.   Left  Carotid: Velocities in the left ICA are    CHF (congestive heart failure) (HCC)    Chronic congestive heart failure (HCC) 10/04/2023   Transthoracic echocardiogram 10/08/2018: LVEF is approximately 50% with hypokinesis of the lateral (base/mid) and basal inferior walls T     Chronic left shoulder pain 03/18/2021   Chronic low back pain without sciatica 10/16/2008   H/o chronic LBP with h/o lumbar disc herniation and intermittent radicular pain  Chronic pain, on stable doses of Norco 10/325 TID. MRI in 2003 showing SMALL CENTRAL DISC HERNIATION AT L4-5.  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      CKD (chronic kidney disease), stage III (HCC)    Coronary artery disease    Coronary artery disease involving native heart with angina pectoris, unspecified vessel or lesion type 08/10/2023   COVID 10/04/2023   Diabetes mellitus    DKA (diabetic ketoacidosis) (HCC) 10/03/2023   Dyspnea    Fall  10/04/2023   GERD (gastroesophageal reflux disease)    Hx of CABG 08/23/2018   LIMA to LAD, RIMA to RCA, SVG to OM3, EVH via right thigh   Hyperlipidemia    Hypertension    Hypertension associated with diabetes (HCC) 12/22/2008   Qualifier: Diagnosis of  By: Edrick MD, Susan     Hypertensive nephropathy 08/18/2020   Insulin  dependent type 2 diabetes mellitus (HCC) 12/22/2020   Left ventricular dysfunction 08/18/2020   TTE ([id number]): LVEF is approximately 50% with hypokinesis of the lateral (base/mid) and basal inferior walls The cavity size was normal.      Long term (current) use of antithrombotics/antiplatelets 09/21/2022   Planned 57-months DAPT for DES for PAD on 09/21/2022.  Planned 6 months therapy. End 03/23/23.   Peripheral artery disease 08/31/2021   [facility] Vascular Study lower extremities 08/30/21:            Today's ABI  Today's TBI   Previous ABIPrevious TBI  +-------+-----------+-----------+------------+------------+  Right  0.76       0.31        1.03                      +-------+-----------+-----------+------------+------------+  Left   0.63       0.20       0.95                      +-------+-----------+---------   Proteinuria 08/18/2020   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   LIMA to LAD, RIMA to RCA, SVG to OM3, EVH via right thigh   Sleeping difficulties 09/08/2019   Subclavian artery stenosis, right 08/18/2020   Carotid dopplers 08/2019 40-59% bilateral stenosis and right subclavian stenosis   Tobacco abuse    Tobacco abuse disorder    Qualifier: Diagnosis of  By: Edrick MD, Devere      Assessment: 88 YOM presenting with SOB, CT angio shows faint nonocclusive PE, he is not on anticoagulation PTA.  12/6 AM: Heparin  level therapeutic at 0.33 with heparin  running at 1200 units/hour. Slight dip in Hgb/PLT. No signs of bleeding or issues with the heparin  infusion noted.   Goal of Therapy:  Heparin  level 0.3-0.7 units/ml Monitor platelets by anticoagulation protocol: Yes   Plan:  Continue heparin  infusion at 1,200 units/h  F/u 8 hour heparin  level F/u long term AC plan  ADDENDUM:  Pharmacy consulted to transition patient from heparin  to apixaban  for PE.   - STOP heparin  infusion - START apixaban  10 mg BID x 7 days, followed by 5 mg BID- administer first dose at the same time heparin  infusion is stopped  - AVS posted, needs education prior to discharge - pt will be able to use 30d free card at d/c, after which time copay will be $47    Massie Fila, PharmD Clinical Pharmacist  11/16/2024 9:36 AM

## 2024-11-16 NOTE — Progress Notes (Signed)
 Echocardiogram 2D Echocardiogram has been performed.  Daquann Merriott N Akeela Busk,RDCS 11/16/2024, 3:02 PM

## 2024-11-16 NOTE — Discharge Instructions (Addendum)
 Information on my medicine - ELIQUIS  (apixaban )  Why was Eliquis  prescribed for you? Eliquis  was prescribed to treat blood clots that may have been found in the veins of your legs (deep vein thrombosis) or in your lungs (pulmonary embolism) and to reduce the risk of them occurring again.  What do You need to know about Eliquis  ? The starting dose is 10 mg (two 5 mg tablets) taken TWICE daily for the FIRST SEVEN (7) DAYS, then on 11/23/24  the dose is reduced to ONE 5 mg tablet taken TWICE daily.  Eliquis  may be taken with or without food.   Try to take the dose about the same time in the morning and in the evening. If you have difficulty swallowing the tablet whole please discuss with your pharmacist how to take the medication safely.  Take Eliquis  exactly as prescribed and DO NOT stop taking Eliquis  without talking to the doctor who prescribed the medication.  Stopping may increase your risk of developing a new blood clot.  Refill your prescription before you run out.  After discharge, you should have regular check-up appointments with your healthcare provider that is prescribing your Eliquis .    What do you do if you miss a dose? If a dose of ELIQUIS  is not taken at the scheduled time, take it as soon as possible on the same day and twice-daily administration should be resumed. The dose should not be doubled to make up for a missed dose.  Important Safety Information A possible side effect of Eliquis  is bleeding. You should call your healthcare provider right away if you experience any of the following: Bleeding from an injury or your nose that does not stop. Unusual colored urine (red or dark brown) or unusual colored stools (red or black). Unusual bruising for unknown reasons. A serious fall or if you hit your head (even if there is no bleeding).  Some medicines may interact with Eliquis  and might increase your risk of bleeding or clotting while on Eliquis . To help avoid this,  consult your healthcare provider or pharmacist prior to using any new prescription or non-prescription medications, including herbals, vitamins, non-steroidal anti-inflammatory drugs (NSAIDs) and supplements.  This website has more information on Eliquis  (apixaban ): [email or web address]

## 2024-11-16 NOTE — Progress Notes (Addendum)
 PROGRESS NOTE  Colin Keller FMW:[id number] DOB: 1956/02/12 DOA: 11/15/2024 PCP: McDiarmid, Krystal BIRCH, MD   LOS: 1 day   Brief Narrative / Interim history: 68 year old male with history of chronic diastolic CHF, IDDM, CAD with prior episodes of DKA, history of CABG, HTN, HLD, peripheral vascular disease comes into the hospital with exertional dyspnea, intermittent chest discomfort.  He was quite hypertensive on arrival with a blood pressure in the 200 systolic, felt to have slight fluid overload with small pleural effusions, but a CT scan showed a PE.  He was started on heparin  and admitted to the hospital.  Subjective / 24h Interval events: Complains of indigestion this morning, but overall feeling better than last night.  He has less shortness of breath  Assesement and Plan: Principal problem Acute pulmonary embolism -fairly small, started on heparin  and he is tolerating it well.  He denies any prior history of significant bleeds.  Switch to Eliquis .  Have asked patient to start ambulating including the hallway to assess his dyspnea with activity - Update a 2D echocardiogram  Addendum 4 PM.  2D echocardiogram resulted, showing LVEF 45-50% with mildly decreased function, global hypokinesis.  He also has grade 3 diastolic dysfunction, RV function was normal.  It is slightly different from the one in 2019, but not a whole lot.  He has no active ACS type symptoms and generally has been fairly asymptomatic with ADLs up until this episode.  I went back and discussed with him and his daughter that he probably needs to be on some form of diuretic moving forward, being very mindful of his kidney function.  We also discussed low-sodium diet.  Will initiate p.o. furosemide  starting tomorrow morning since he already received the IV dose today  Active problems Chronic systolic CHF, CAD, history of CABG -most recent 2D echo was in 2019, LVEF was 50% with hypokinesis of the lateral and basal inferior walls.  He  was slightly fluid overloaded on admission and received Lasix  last night, including a dose this morning.  Repeat 2D echo pending, it was planned as an outpatient anyway - Appears euvolemic today, he is on room air, comfortable, discontinue further furosemide   CKD 3B /borderline stage IV - it appears that his most recent outpatient creatinine values ranging between 1.9 and 2.2.  Currently appears at baseline  Diabetes mellitus, poorly controlled, with hyperglycemia -remains hyperglycemic on the regimen below, increase glargine today, continue sliding scale.  A1c is at 9.6  Peripheral vascular disease-chronic, outpatient follow-up  Essential hypertension-significantly elevated on admission, but improving.  Home diltiazem  has been resumed, since I will discontinue furosemide  I will start her carvedilol  but at a lower dose  Chronic pain-continue home hydrocodone   Normocytic anemia-likely of chronic renal disease, there is no evidence of bleeding  Tobacco use- patient is determined to quit following this hospitalization, he lives with his wife and she is a non-smoker.  Discussed that she will get rid of his cigarettes before returning home  Scheduled Meds:  apixaban   10 mg Oral BID   Followed by   NOREEN ON 11/23/2024] apixaban   5 mg Oral BID   diltiazem   180 mg Oral Daily   famotidine   20 mg Oral Daily   hydrALAZINE   50 mg Oral TID   insulin  aspart  0-15 Units Subcutaneous TID WC   insulin  aspart  0-5 Units Subcutaneous QHS   insulin  glargine  10 Units Subcutaneous Daily   loratadine   10 mg Oral Daily   pantoprazole   40  mg Oral Daily   tamsulosin   0.4 mg Oral Daily   Continuous Infusions: PRN Meds:.alum & mag hydroxide-simeth, HYDROcodone -acetaminophen , morphine  injection, ondansetron  **OR** ondansetron  (ZOFRAN ) IV  Current Outpatient Medications  Medication Instructions   APIXABAN  (ELIQUIS ) VTE STARTER PACK (10MG  AND 5MG ) Take as directed on package: start with two-5mg  tablets twice  daily for 7 days. On day 8, switch to one-5mg  tablet twice daily.   [START ON 12/17/2024] apixaban  (ELIQUIS ) 5 mg, Oral, 2 times daily, To begin after completion of Eliquis  (apixaban ) VTE starter pack   aspirin  81 mg, Oral, Daily   carvedilol  (COREG ) 25 mg, Oral, 2 times daily with meals   cetirizine (ZYRTEC) 10 mg, Oral, Daily   Continuous Glucose Sensor (FREESTYLE LIBRE 3 PLUS SENSOR) MISC Change sensor every 15 days.   Cyanocobalamin (VITAMIN B 12 PO) 1 tablet, Daily   diltiazem  (CARDIZEM  CD) 180 mg, Oral, Daily   famotidine  (PEPCID ) 20 mg, Oral, Daily   Glucagon  (BAQSIMI  TWO PACK) 3 MG/DOSE POWD Use as directed PRN low glucose   hydrALAZINE  (APRESOLINE ) 50 mg, Oral, 3 times daily   hydrochlorothiazide  (HYDRODIURIL ) 12.5 mg, Oral, Daily   HYDROcodone -acetaminophen  (NORCO) 10-325 MG tablet 1 tablet, Oral, 2 times daily PRN   Insulin  Glargine (BASAGLAR  KWIKPEN) 100 UNIT/ML DIAL AND INJECT 20 UNITS UNDER THE SKIN DAILY.   insulin  lispro (HUMALOG ) 3-5 Units, Subcutaneous, 3 times daily with meals, 3-4 units prior to breakfast, 4-5 units prior to evening meal   metFORMIN  (GLUCOPHAGE ) 1,000 mg, Oral, 2 times daily with meals, TAKE 2 TABLETS BY MOUTH TWICE DAILY WITH A MEAL   promethazine  (PHENERGAN ) 25 mg, Oral, Every 6 hours PRN   Repatha  SureClick 140 mg, Subcutaneous, Every 14 days   tamsulosin  (FLOMAX ) 0.4 mg, Oral, Daily    Diet Orders (From admission, onward)     Start     Ordered   11/15/24 2302  Diet heart healthy/carb modified Room service appropriate? Yes; Fluid consistency: Thin  Diet effective now       Question Answer Comment  Diet-HS Snack? Nothing   Room service appropriate? Yes   Fluid consistency: Thin      11/15/24 2301            DVT prophylaxis:  apixaban  (ELIQUIS ) tablet 10 mg  apixaban  (ELIQUIS ) tablet 5 mg   Lab Results  Component Value Date   PLT 268 11/16/2024      Code Status: Full Code  Family Communication: Wife present at bedside, daughter over  the phone  Status is: Inpatient Remains inpatient appropriate because: Echo pending, blood pressure and glucose control  Level of care: Telemetry  Consultants:  none  Objective: Vitals:   11/15/24 2305 11/16/24 0426 11/16/24 0742 11/16/24 1034  BP: (!) 189/88 (!) 159/86 (!) 172/73 131/68  Pulse:  75 80 63  Resp: (!) 21 19    Temp: 99.1 F (37.3 C) 98.5 F (36.9 C)  98.2 F (36.8 C)  TempSrc: Oral Oral  Oral  SpO2:   95%   Weight:      Height:        Intake/Output Summary (Last 24 hours) at 11/16/2024 1118 Last data filed at 11/16/2024 0700 Gross per 24 hour  Intake 1467.09 ml  Output 1200 ml  Net 267.09 ml   Wt Readings from Last 3 Encounters:  11/15/24 69.9 kg  11/12/24 69.9 kg  10/30/24 66.6 kg    Examination:  Constitutional: NAD Eyes: no scleral icterus ENMT: Mucous membranes are moist.  Neck: normal, supple  Respiratory: clear to auscultation bilaterally, no wheezing, no crackles. Normal respiratory effort.  Slightly diminished at the bases Cardiovascular: Regular rate and rhythm, no murmurs / rubs / gallops. No LE edema.  Abdomen: non distended, no tenderness. Bowel sounds positive.  Musculoskeletal: no clubbing / cyanosis.    Data Reviewed: I have independently reviewed following labs and imaging studies   CBC Recent Labs  Lab 11/15/24 1643 11/15/24 1647 11/15/24 1755 11/16/24 0605  WBC 11.9*  --   --  13.6*  HGB 10.1* 10.2* 9.9* 8.9*  HCT 30.6* 30.0* 29.0* 26.2*  PLT 318  --   --  268  MCV 97.5  --   --  96.0  MCH 32.2  --   --  32.6  MCHC 33.0  --   --  34.0  RDW 12.9  --   --  13.2    Recent Labs  Lab 11/15/24 1643 11/15/24 1647 11/15/24 1749 11/15/24 1755 11/15/24 2352 11/16/24 0605  NA 140 141  --  139  --  137  K 3.6 3.7  --  3.7  --  3.6  CL 105 105  --   --   --  104  CO2 26  --   --   --   --  21*  GLUCOSE 326* 313*  --   --   --  343*  BUN 36* 37*  --   --   --  36*  CREATININE 2.14* 2.10*  --   --   --  2.27*  CALCIUM   8.9  --   --   --   --  8.1*  AST  --   --   --   --   --  11*  ALT  --   --   --   --   --  8  ALKPHOS  --   --   --   --   --  76  BILITOT  --   --   --   --   --  0.7  ALBUMIN   --   --   --   --   --  2.3*  DDIMER  --   --  2.39*  --   --   --   HGBA1C  --   --   --   --  9.6*  --   BNP 3,163.4*  --   --   --   --   --     ------------------------------------------------------------------------------------------------------------------ No results for input(s): CHOL, HDL, LDLCALC, TRIG, CHOLHDL, LDLDIRECT in the last 72 hours.  Lab Results  Component Value Date   HGBA1C 9.6 (H) 11/15/2024   ------------------------------------------------------------------------------------------------------------------ No results for input(s): TSH, T4TOTAL, T3FREE, THYROIDAB in the last 72 hours.  Invalid input(s): FREET3  Cardiac Enzymes No results for input(s): CKMB, TROPONINI, MYOGLOBIN in the last 168 hours.  Invalid input(s): CK ------------------------------------------------------------------------------------------------------------------    Component Value Date/Time   BNP 3,163.4 (H) 11/15/2024 1643    CBG: Recent Labs  Lab 11/15/24 1624 11/15/24 2022 11/15/24 2215 11/16/24 0054  GLUCAP 306* 339* 326* 367*    No results found for this or any previous visit (from the past 240 hours).   Radiology Studies: CT Angio Chest PE W and/or Wo Contrast Result Date: 11/15/2024 EXAM: CTA of the Chest with contrast for PE 11/15/2024 07:49:11 PM TECHNIQUE: CTA of the chest was performed after the administration of 60 mL of iohexol  (OMNIPAQUE ) 350 MG/ML injection. Multiplanar reformatted images are provided for  review. MIP images are provided for review. Automated exposure control, iterative reconstruction, and/or weight based adjustment of the mA/kV was utilized to reduce the radiation dose to as low as reasonably achievable. COMPARISON: None available.  CLINICAL HISTORY: sob w/ + d dimer FINDINGS: PULMONARY ARTERIES: Pulmonary arteries are adequately opacified for evaluation. A faint nonocclusive filling defect is present in the posterior basilar segmental artery of the left lower lobe. No other definite emboli are present. Main pulmonary artery is normal in caliber. MEDIASTINUM: The heart and pericardium demonstrate no acute abnormality. Dense atherosclerotic calcifications are present in the coronary arteries. Atherosclerotic changes are present in the aortic arch and great vessel origins. 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 There is no acute abnormality of the thoracic aorta. LYMPH NODES: No mediastinal, hilar or axillary lymphadenopathy. LUNGS AND PLEURA: A moderate left pleural effusion is present. A small right pleural effusion is present. Mild dependent atelectasis is present bilaterally. A 5 mm nodule in the left upper lobe is best visualized on image 49 of series 6. Punctate nodules are present in the left lower lobe. An 8 mm nodule is present within the superior segment of the right lower lobe on image 65 of series 7. No pneumothorax. UPPER ABDOMEN: Limited images of the upper abdomen are unremarkable. SOFT TISSUES AND BONES: No acute bone or soft tissue abnormality. IMPRESSION: 1. Faint nonocclusive filling defect in the posterior basilar segmental artery of the left lower lobe, consistent with a small pulmonary embolism. No other definite emboli are present. 2. 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 3. Moderate left pleural effusion and small right pleural effusion. 4. Pulmonary nodules including a 5 mm left upper lobe nodule and an 8 mm right lower lobe nodule. For the 8 mm solid nodule, recommend non-contrast chest CT at 36 months; if high risk for malignancy, consider PET/CT or tissue sampling per Fleischner Society Guidelines. Electronically signed by: Lonni Necessary MD  11/15/2024 08:08 PM EST RP Workstation: [id number]   DG Chest 2 View Result Date: 11/15/2024 EXAM: 2 VIEW(S) XRAY OF THE CHEST 11/15/2024 06:07:00 PM COMPARISON: 10/03/2023 status post coronary artery bypass graft. CLINICAL HISTORY: SOB. FINDINGS: LUNGS AND PLEURA: No focal pulmonary opacity. No pleural effusion. No pneumothorax. HEART AND MEDIASTINUM: No acute abnormality of the cardiac and mediastinal silhouettes. BONES AND SOFT TISSUES: No acute osseous abnormality. IMPRESSION: 1. No acute cardiopulmonary process. Electronically signed by: [name] Seip MD 11/15/2024 06:13 PM EST RP Workstation: [id number]     Nilda Fendt, MD, PhD [facility]  Between 7 am - 7 pm I am available, please contact me via Amion (for emergencies) or Securechat (non urgent messages)  Between 7 pm - 7 am I am not available, please contact night coverage MD/APP via Amion

## 2024-11-16 NOTE — Hospital Course (Addendum)
 This is a 68 year old male with PMH of CHF, T2 DM, CAD, H/O CABG, HLD, HTN, PVD who was admitted to the [facility] for acute PE.  His hospital course is detailed below:  Pulmonary embolism Patient presented with dyspnea, CTA chest revealed posterior basilar segmental artery PE in the left lower lobe.  Patient started on IV heparin  drip (12/5-12/6) and switched to Eliquis  (12/6-; tentatively planning a 4 month course).  CHF exacerbation CTA chest with moderate left pleural effusion small right pleural effusion as well as pulmonary nodules including a 5 mm left upper nodule and 80 mm right lower lobe nodule.  Bilateral pleural effusions suspected secondary to CHF with associated hypervolemia on admission exam.  Patient received IV Lasix  40 mg x 2 and appeared euvolemic, so diuresis was discontinued.  Echocardiogram, which had been planned outpatient prior to admission, revealed mildly reduced EF 45-50%, no other acute changes from prior echo in 2019.  Malignant hypertension History of resistant hypertension, uncontrolled despite 4 medication regimen per cardiology outpatient who ordered outpatient renal artery Doppler ultrasound to evaluate for renal artery stenosis.  Patient with BP of 202/94 in the ED, restarted on home medication regimen with improvement of blood pressure.  Additional chronic conditions managed during this hospitalization include: T2DM, PVD, CKD.  PCP recommendations: Follow-up pulmonary nodules and ensure up-to-date on all cancer screenings. Ensure compliance with Eliquis . Optimize GDMT for mild HFrEF, could add ARB and MRA

## 2024-11-17 ENCOUNTER — Other Ambulatory Visit (HOSPITAL_COMMUNITY): Payer: Self-pay

## 2024-11-17 DIAGNOSIS — E1159 Type 2 diabetes mellitus with other circulatory complications: Secondary | ICD-10-CM

## 2024-11-17 DIAGNOSIS — I5020 Unspecified systolic (congestive) heart failure: Secondary | ICD-10-CM

## 2024-11-17 DIAGNOSIS — I2699 Other pulmonary embolism without acute cor pulmonale: Secondary | ICD-10-CM | POA: Diagnosis not present

## 2024-11-17 DIAGNOSIS — E1151 Type 2 diabetes mellitus with diabetic peripheral angiopathy without gangrene: Secondary | ICD-10-CM | POA: Diagnosis not present

## 2024-11-17 DIAGNOSIS — I739 Peripheral vascular disease, unspecified: Secondary | ICD-10-CM

## 2024-11-17 DIAGNOSIS — R0609 Other forms of dyspnea: Secondary | ICD-10-CM | POA: Diagnosis not present

## 2024-11-17 DIAGNOSIS — I152 Hypertension secondary to endocrine disorders: Secondary | ICD-10-CM

## 2024-11-17 DIAGNOSIS — N1832 Chronic kidney disease, stage 3b: Secondary | ICD-10-CM

## 2024-11-17 DIAGNOSIS — M48061 Spinal stenosis, lumbar region without neurogenic claudication: Secondary | ICD-10-CM

## 2024-11-17 DIAGNOSIS — Z789 Other specified health status: Secondary | ICD-10-CM

## 2024-11-17 DIAGNOSIS — E162 Hypoglycemia, unspecified: Secondary | ICD-10-CM | POA: Insufficient documentation

## 2024-11-17 DIAGNOSIS — K219 Gastro-esophageal reflux disease without esophagitis: Secondary | ICD-10-CM

## 2024-11-17 LAB — GLUCOSE, CAPILLARY
Glucose-Capillary: 25 mg/dL — CL (ref 70–99)
Glucose-Capillary: 25 mg/dL — CL (ref 70–99)
Glucose-Capillary: 126 mg/dL — ABNORMAL HIGH (ref 70–99)
Glucose-Capillary: 124 mg/dL — ABNORMAL HIGH (ref 70–99)
Glucose-Capillary: 177 mg/dL — ABNORMAL HIGH (ref 70–99)

## 2024-11-17 LAB — COMPREHENSIVE METABOLIC PANEL WITH GFR
ALT: 9 U/L (ref 0–44)
AST: 13 U/L — ABNORMAL LOW (ref 15–41)
Albumin: 2.1 g/dL — ABNORMAL LOW (ref 3.5–5.0)
Alkaline Phosphatase: 74 U/L (ref 38–126)
Anion gap: 7 (ref 5–15)
BUN: 33 mg/dL — ABNORMAL HIGH (ref 8–23)
CO2: 23 mmol/L (ref 22–32)
Calcium: 8.3 mg/dL — ABNORMAL LOW (ref 8.9–10.3)
Chloride: 110 mmol/L (ref 98–111)
Creatinine, Ser: 2.46 mg/dL — ABNORMAL HIGH (ref 0.61–1.24)
GFR, Estimated: 27 mL/min — ABNORMAL LOW (ref >60)
Glucose, Bld: 65 mg/dL — ABNORMAL LOW (ref 70–99)
Potassium: 3.5 mmol/L (ref 3.5–5.1)
Sodium: 140 mmol/L (ref 135–145)
Total Bilirubin: 0.2 mg/dL (ref 0.0–1.2)
Total Protein: 5.1 g/dL — ABNORMAL LOW (ref 6.5–8.1)

## 2024-11-17 LAB — CBC
HCT: 24.2 % — ABNORMAL LOW (ref 39.0–52.0)
Hemoglobin: 8.1 g/dL — ABNORMAL LOW (ref 13.0–17.0)
MCH: 32.3 pg (ref 26.0–34.0)
MCHC: 33.5 g/dL (ref 30.0–36.0)
MCV: 96.4 fL (ref 80.0–100.0)
Platelets: 251 K/uL (ref 150–400)
RBC: 2.51 MIL/uL — ABNORMAL LOW (ref 4.22–5.81)
RDW: 13.1 % (ref 11.5–15.5)
WBC: 10 K/uL (ref 4.0–10.5)
nRBC: 0 % (ref 0.0–0.2)

## 2024-11-17 LAB — MAGNESIUM: Magnesium: 1.5 mg/dL — ABNORMAL LOW (ref 1.7–2.4)

## 2024-11-17 MED ORDER — DEXTROSE 50 % IV SOLN
INTRAVENOUS | Status: DC
  Filled 2024-11-17: qty 50

## 2024-11-17 MED ORDER — CARVEDILOL 6.25 MG PO TABS
6.2500 mg | ORAL_TABLET | Freq: Two times a day (BID) | ORAL | 0 refills | Status: DC
  Filled 2024-11-17: qty 30, 15d supply, fill #0

## 2024-11-17 MED ORDER — PANTOPRAZOLE SODIUM 40 MG PO TBEC
40.0000 mg | DELAYED_RELEASE_TABLET | Freq: Every day | ORAL | 0 refills | Status: DC
  Filled 2024-11-17: qty 30, 30d supply, fill #0

## 2024-11-17 MED ORDER — DEXTROSE 50 % IV SOLN
INTRAVENOUS | Status: AC
  Administered 2024-11-17: 50 mL
  Filled 2024-11-17: qty 50

## 2024-11-17 MED ORDER — FUROSEMIDE 40 MG PO TABS
20.0000 mg | ORAL_TABLET | Freq: Every day | ORAL | 0 refills | Status: DC
  Filled 2024-11-17: qty 30, 60d supply, fill #0

## 2024-11-17 NOTE — Discharge Summary (Addendum)
 Family Medicine Teaching [facility] Discharge Summary  Patient name: Colin Keller Medical record number: [id number] Date of birth: 04/01/1956 Age: 68 y.o. Gender: male Date of Admission: 11/15/2024  Date of Discharge: 11/17/2024 Admitting Physician: Emery LITTIE Fuss, MD  Primary Care Provider: McDiarmid, Krystal BIRCH, MD Consultants: None  Indication for Hospitalization: Acute PE  Discharge Diagnoses/Problem List:  Principal Problem for Admission: Acute pulmonary embolism Other Problems addressed during stay:  Principal Problem:   Exertional dyspnea Active Problems:   Hyperlipidemia associated with type 2 diabetes mellitus (HCC)   Hypertension associated with diabetes (HCC)   DM (diabetes mellitus), type 2 with peripheral vascular complications (HCC)   CKD stage G3b/A3, GFR 30-44 and albumin  creatinine ratio >300 mg/g (HCC)   HFrEF (heart failure with reduced ejection fraction) (HCC)   PAD (peripheral artery disease)   Bilateral carotid artery disease   Subclavian artery stenosis, right   Severe Lumbar spinal stenosis   GERD without esophagitis   Pleural effusion due to CHF (congestive heart failure) (HCC)   Acute pulmonary embolism (HCC)   Hypoglycemia   Brief Hospital Course:  This is a 68 year old male with PMH of CHF, T2 DM, CAD, H/O CABG, HLD, HTN, PVD who was admitted to the [facility] for acute PE.  His hospital course is detailed below:  Pulmonary embolism Patient presented with dyspnea, CTA chest revealed posterior basilar segmental artery PE in the left lower lobe.  Patient started on IV heparin  drip (12/5-12/6) and switched to Eliquis  (12/6-; tentatively planning a 4 month course).  CHF exacerbation CTA chest with moderate left pleural effusion small right pleural effusion as well as pulmonary nodules including a 5 mm left upper nodule and 80 mm right lower lobe nodule.  Bilateral pleural effusions suspected secondary to CHF with  associated hypervolemia on admission exam.  Patient received IV Lasix  40 mg x 2 and appeared euvolemic, so diuresis was discontinued.  Echocardiogram, which had been planned outpatient prior to admission, revealed mildly reduced EF 45-50%, no other acute changes from prior echo in 2019.  Malignant hypertension History of resistant hypertension, uncontrolled despite 4 medication regimen per cardiology outpatient who ordered outpatient renal artery Doppler ultrasound to evaluate for renal artery stenosis.  Patient with BP of 202/94 in the ED, restarted on home medication regimen with improvement of blood pressure.  Additional chronic conditions managed during this hospitalization include: T2DM, PVD, CKD.  PCP recommendations: Follow-up pulmonary nodules and ensure up-to-date on all cancer screenings. Ensure compliance with Eliquis . Optimize GDMT for mild HFrEF, could add ARB and MRA     Results/Tests Pending at Time of Discharge:  Unresulted Labs (From admission, onward)    None        Disposition: Home  Discharge Condition: Stable  Discharge Exam:  Vitals:   11/17/24 0934 11/17/24 1146  BP: (!) 168/83 (!) 168/75  Pulse:  66  Resp:  20  Temp:  97.8 F (36.6 C)  SpO2:  98%   General: Resting comfortably in no acute distress  cardiovascular: Regular rate rhythm, normal S1 and S2, no murmurs rubs or gallops Respiratory: Clear to auscultation bilaterally, no wheezes or crackles Abdomen: Active bowel sounds, nondistended, nontender Extremities: Nonedematous, nontender  Significant Procedures: None  Significant Labs and Imaging:  Recent Labs  Lab 11/16/24 0605 11/17/24 0248  WBC 13.6* 10.0  HGB 8.9* 8.1*  HCT 26.2* 24.2*  PLT 268 251   Recent Labs  Lab 11/16/24 0605 11/17/24 0248  NA 137  140  K 3.6 3.5  CL 104 110  CO2 21* 23  GLUCOSE 343* 65*  BUN 36* 33*  CREATININE 2.27* 2.46*  CALCIUM  8.1* 8.3*  MG  --  1.5*  ALKPHOS 76 74  AST 11* 13*  ALT 8 9   ALBUMIN  2.3* 2.1*    CTA PE IMPRESSION: 1. Faint nonocclusive filling defect in the posterior basilar segmental artery of the left lower lobe, consistent with a small pulmonary embolism. No other definite emboli are present. 2. 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 3. Moderate left pleural effusion and small right pleural effusion. 4. Pulmonary nodules including a 5 mm left upper lobe nodule and an 8 mm right lower lobe nodule. For the 8 mm solid nodule, recommend non-contrast chest CT at 36 months; if high risk for malignancy, consider PET/CT or tissue sampling per Fleischner Society Guidelines.  Discharge Medications:  Allergies as of 11/17/2024       Reactions   Amlodipine  Other (See Comments)   Leg swelling   Empagliflozin  Other (See Comments)   Euglycemic DKA   Amitriptyline  Other (See Comments)   Urinary retention   Lisinopril  Other (See Comments)   Hyperkalemia   Nortriptyline  Hcl Other (See Comments)   Vivid / bad dreams   Statins    Aches in Joints    Simvastatin  Other (See Comments)   Arthralgias        Medication List     STOP taking these medications    famotidine  20 MG tablet Commonly known as: PEPCID    VITAMIN B 12 PO       TAKE these medications    aspirin  81 MG tablet Take 81 mg by mouth daily.   Baqsimi  Two Pack 3 MG/DOSE Powd Generic drug: Glucagon  Use as directed PRN low glucose   Basaglar  KwikPen 100 UNIT/ML DIAL AND INJECT 20 UNITS UNDER THE SKIN DAILY. What changed: See the new instructions.   carvedilol  6.25 MG tablet Commonly known as: COREG  Take 1 tablet (6.25 mg total) by mouth 2 (two) times daily with a meal. What changed:  medication strength how much to take   cetirizine 10 MG chewable tablet Commonly known as: ZYRTEC Chew 10 mg by mouth daily.   diltiazem  180 MG 24 hr capsule Commonly known as: CARDIZEM  CD Take 180 mg by mouth daily.   Eliquis  DVT/PE Starter Pack Generic drug:  Apixaban  Starter Pack (10mg  and 5mg ) Take as directed on package: start with two-5mg  tablets twice daily for 7 days. On day 8, switch to one-5mg  tablet twice daily.   Eliquis  5 MG Tabs tablet Generic drug: apixaban  Take 1 tablet (5 mg total) by mouth 2 (two) times daily. To begin after completion of Eliquis  (apixaban ) VTE starter pack Start taking on: December 17, 2024   FreeStyle Libre 3 Plus Sensor Misc Change sensor every 15 days.   furosemide  40 MG tablet Commonly known as: LASIX  Take 0.5 tablets (20 mg total) by mouth daily. Start taking on: November 18, 2024   hydrALAZINE  50 MG tablet Commonly known as: APRESOLINE  Take 1 tablet (50 mg total) by mouth 3 (three) times daily.   hydrochlorothiazide  12.5 MG tablet Commonly known as: HYDRODIURIL  Take 1 tablet (12.5 mg total) by mouth daily.   HYDROcodone -acetaminophen  10-325 MG tablet Commonly known as: NORCO TAKE 1 TABLET BY MOUTH TWICE DAILY AS NEEDED What changed: when to take this   insulin  lispro 100 UNIT/ML injection Commonly known as: HUMALOG  Inject 0.03-0.05 mLs (3-5 Units total)  into the skin 3 (three) times daily with meals. 3-4 units prior to breakfast, 4-5 units prior to evening meal   metFORMIN  500 MG tablet Commonly known as: GLUCOPHAGE  Take 2 tablets (1,000 mg total) by mouth 2 (two) times daily with a meal. TAKE 2 TABLETS BY MOUTH TWICE DAILY WITH A MEAL   pantoprazole  40 MG tablet Commonly known as: PROTONIX  Take 1 tablet (40 mg total) by mouth daily. Start taking on: November 18, 2024   promethazine  25 MG tablet Commonly known as: PHENERGAN  Take 1 tablet (25 mg total) by mouth every 6 (six) hours as needed for nausea or vomiting. What changed: how much to take   Repatha  SureClick 140 MG/ML Soaj Generic drug: Evolocumab  Inject 140 mg into the skin every 14 (fourteen) days.   tamsulosin  0.4 MG Caps capsule Commonly known as: FLOMAX  Take 1 capsule (0.4 mg total) by mouth daily.        Discharge  Instructions: Please refer to Patient Instructions section of EMR for full details.  Patient was counseled important signs and symptoms that should prompt return to medical care, changes in medications, dietary instructions, activity restrictions, and follow up appointments.   Follow-Up Appointments: Scheduled with Dr. Billee 12/11 at 9:10 AM  Elicia Hamlet, MD 11/17/2024, 6:27 PM PGY-1, [facility]

## 2024-11-17 NOTE — Assessment & Plan Note (Deleted)
 Baseline creatinine function ranging 1.9-2.2, remains at baseline

## 2024-11-17 NOTE — Progress Notes (Addendum)
 CBG 25, 2 orange juices given, 15 min recheck, CBG 25 again.  Gave 50ml 50% Dextrose .  CBG recheck 126 and breakfast will be delivered at 0730

## 2024-11-17 NOTE — Assessment & Plan Note (Deleted)
 Last A1c 9.6, increased glargine yesterday.  CBGs today had a low of 25 early morning, did not correct after juice, given D50 and corrected to 126, started early breakfast.  Will continue to monitor closely for glycemic control and hypoglycemia. -Moderate SSI, CBGs every 4 - DC glargine 10 units twice daily

## 2024-11-17 NOTE — Assessment & Plan Note (Deleted)
 Vital signs stable, on room air.  Small PE found on CTA, started on heparin  and switched to Eliquis  and tolerating well.  Dyspnea ***. -Continue loading dose Eliquis  12/6 - 12/12 -Continue 5mg  Eliquis  BID, for 3 months, consider prolonged course given questionable unprovoked PE

## 2024-11-17 NOTE — Progress Notes (Addendum)
 Discharge   Patient and wife expressed verbal understanding of discharge POC.   Patient and wife given time to ask any questions.  Additional education included in AVS.  Alert oriented in good spirits.   Tele and PIV removed.  Pressure dressings intact.  Discharging to ER entrance.  TOC Meds are at bedside per patient and wife.

## 2024-11-17 NOTE — Assessment & Plan Note (Deleted)
 Essential hypertension: BPs remain mildly elevated, resumed home Dilt, restarted carvedilol  at 6.25 mg, hydralazine  50 mg 3 times daily Chronic pain: Continue home hydrocodone , DC as needed morphine  GERD: DC famotidine  20 mg daily, continue pantoprazole  40 mg daily BPH: Flomax  0.4 mg

## 2024-11-17 NOTE — Assessment & Plan Note (Deleted)
 Updated echo found LVEF 45 to 50% with mild decreased function and global hypokinesis, grade 3 diastolic dysfunction mildly different from previous echo done 2019. - Daily weights, 148 pounds today - Furosemide  p.o.

## 2024-11-17 NOTE — Plan of Care (Signed)
  Problem: Education: Goal: Knowledge of General Education information will improve Description: Including pain rating scale, medication(s)/side effects and non-pharmacologic comfort measures Outcome: Progressing   Problem: Activity: Goal: Risk for activity intolerance will decrease Outcome: Progressing   Problem: Coping: Goal: Level of anxiety will decrease Outcome: Progressing   Problem: Pain Managment: Goal: General experience of comfort will improve and/or be controlled Outcome: Progressing   Problem: Fluid Volume: Goal: Ability to maintain a balanced intake and output will improve Outcome: Progressing

## 2024-11-17 NOTE — Progress Notes (Signed)
 POC BGL taken and was 124. Patient stated this was normal for him. Patient educated on parameters for glucose control. Pt refused 2 units insulin  per protocol. Will recheck at 1200.

## 2024-11-18 ENCOUNTER — Encounter: Payer: Self-pay | Admitting: Family Medicine

## 2024-11-19 ENCOUNTER — Inpatient Hospital Stay (HOSPITAL_COMMUNITY)

## 2024-11-19 ENCOUNTER — Inpatient Hospital Stay (HOSPITAL_COMMUNITY)
Admission: EM | Admit: 2024-11-19 | Discharge: 2024-12-06 | Disposition: A | Discharge status: Home / Self Care | Source: Home / Self Care

## 2024-11-19 ENCOUNTER — Emergency Department (HOSPITAL_COMMUNITY)

## 2024-11-19 ENCOUNTER — Encounter (HOSPITAL_COMMUNITY): Payer: Self-pay | Admitting: Emergency Medicine

## 2024-11-19 ENCOUNTER — Other Ambulatory Visit: Payer: Self-pay

## 2024-11-19 DIAGNOSIS — I480 Paroxysmal atrial fibrillation: Secondary | ICD-10-CM | POA: Diagnosis present

## 2024-11-19 DIAGNOSIS — I2694 Multiple subsegmental pulmonary emboli without acute cor pulmonale: Secondary | ICD-10-CM | POA: Diagnosis present

## 2024-11-19 DIAGNOSIS — D72829 Elevated white blood cell count, unspecified: Secondary | ICD-10-CM | POA: Diagnosis not present

## 2024-11-19 DIAGNOSIS — I6523 Occlusion and stenosis of bilateral carotid arteries: Secondary | ICD-10-CM | POA: Diagnosis not present

## 2024-11-19 DIAGNOSIS — K8689 Other specified diseases of pancreas: Secondary | ICD-10-CM | POA: Diagnosis not present

## 2024-11-19 DIAGNOSIS — Z9889 Other specified postprocedural states: Secondary | ICD-10-CM

## 2024-11-19 DIAGNOSIS — R002 Palpitations: Secondary | ICD-10-CM | POA: Diagnosis present

## 2024-11-19 DIAGNOSIS — E119 Type 2 diabetes mellitus without complications: Secondary | ICD-10-CM

## 2024-11-19 DIAGNOSIS — R188 Other ascites: Secondary | ICD-10-CM | POA: Diagnosis not present

## 2024-11-19 DIAGNOSIS — R918 Other nonspecific abnormal finding of lung field: Secondary | ICD-10-CM | POA: Diagnosis present

## 2024-11-19 DIAGNOSIS — R1011 Right upper quadrant pain: Secondary | ICD-10-CM

## 2024-11-19 DIAGNOSIS — R338 Other retention of urine: Secondary | ICD-10-CM | POA: Diagnosis present

## 2024-11-19 DIAGNOSIS — Z8616 Personal history of COVID-19: Secondary | ICD-10-CM | POA: Diagnosis not present

## 2024-11-19 DIAGNOSIS — I2699 Other pulmonary embolism without acute cor pulmonale: Secondary | ICD-10-CM | POA: Diagnosis present

## 2024-11-19 DIAGNOSIS — R001 Bradycardia, unspecified: Secondary | ICD-10-CM | POA: Diagnosis present

## 2024-11-19 DIAGNOSIS — R64 Cachexia: Secondary | ICD-10-CM | POA: Diagnosis present

## 2024-11-19 DIAGNOSIS — Z86711 Personal history of pulmonary embolism: Secondary | ICD-10-CM

## 2024-11-19 DIAGNOSIS — N1832 Chronic kidney disease, stage 3b: Secondary | ICD-10-CM | POA: Diagnosis present

## 2024-11-19 DIAGNOSIS — C252 Malignant neoplasm of tail of pancreas: Secondary | ICD-10-CM | POA: Diagnosis present

## 2024-11-19 DIAGNOSIS — I130 Hypertensive heart and chronic kidney disease with heart failure and stage 1 through stage 4 chronic kidney disease, or unspecified chronic kidney disease: Secondary | ICD-10-CM | POA: Diagnosis present

## 2024-11-19 DIAGNOSIS — E1122 Type 2 diabetes mellitus with diabetic chronic kidney disease: Secondary | ICD-10-CM | POA: Diagnosis present

## 2024-11-19 DIAGNOSIS — K838 Other specified diseases of biliary tract: Secondary | ICD-10-CM | POA: Diagnosis not present

## 2024-11-19 DIAGNOSIS — E440 Moderate protein-calorie malnutrition: Secondary | ICD-10-CM | POA: Diagnosis present

## 2024-11-19 DIAGNOSIS — Z7984 Long term (current) use of oral hypoglycemic drugs: Secondary | ICD-10-CM

## 2024-11-19 DIAGNOSIS — C250 Malignant neoplasm of head of pancreas: Secondary | ICD-10-CM | POA: Diagnosis present

## 2024-11-19 DIAGNOSIS — T182XXA Foreign body in stomach, initial encounter: Secondary | ICD-10-CM | POA: Diagnosis not present

## 2024-11-19 DIAGNOSIS — R9431 Abnormal electrocardiogram [ECG] [EKG]: Secondary | ICD-10-CM | POA: Diagnosis not present

## 2024-11-19 DIAGNOSIS — J449 Chronic obstructive pulmonary disease, unspecified: Secondary | ICD-10-CM | POA: Diagnosis present

## 2024-11-19 DIAGNOSIS — E11319 Type 2 diabetes mellitus with unspecified diabetic retinopathy without macular edema: Secondary | ICD-10-CM

## 2024-11-19 DIAGNOSIS — I9581 Postprocedural hypotension: Secondary | ICD-10-CM | POA: Diagnosis not present

## 2024-11-19 DIAGNOSIS — I3481 Nonrheumatic mitral (valve) annulus calcification: Secondary | ICD-10-CM | POA: Diagnosis present

## 2024-11-19 DIAGNOSIS — K861 Other chronic pancreatitis: Secondary | ICD-10-CM | POA: Diagnosis present

## 2024-11-19 DIAGNOSIS — F17210 Nicotine dependence, cigarettes, uncomplicated: Secondary | ICD-10-CM | POA: Diagnosis not present

## 2024-11-19 DIAGNOSIS — R933 Abnormal findings on diagnostic imaging of other parts of digestive tract: Secondary | ICD-10-CM

## 2024-11-19 DIAGNOSIS — E1121 Type 2 diabetes mellitus with diabetic nephropathy: Secondary | ICD-10-CM | POA: Diagnosis not present

## 2024-11-19 DIAGNOSIS — F22 Delusional disorders: Secondary | ICD-10-CM | POA: Diagnosis present

## 2024-11-19 DIAGNOSIS — Z789 Other specified health status: Secondary | ICD-10-CM

## 2024-11-19 DIAGNOSIS — R748 Abnormal levels of other serum enzymes: Secondary | ICD-10-CM | POA: Diagnosis not present

## 2024-11-19 DIAGNOSIS — E11649 Type 2 diabetes mellitus with hypoglycemia without coma: Secondary | ICD-10-CM | POA: Diagnosis present

## 2024-11-19 DIAGNOSIS — K869 Disease of pancreas, unspecified: Secondary | ICD-10-CM | POA: Diagnosis not present

## 2024-11-19 DIAGNOSIS — F32A Depression, unspecified: Secondary | ICD-10-CM | POA: Diagnosis present

## 2024-11-19 DIAGNOSIS — Z7901 Long term (current) use of anticoagulants: Secondary | ICD-10-CM

## 2024-11-19 DIAGNOSIS — Z7902 Long term (current) use of antithrombotics/antiplatelets: Secondary | ICD-10-CM

## 2024-11-19 DIAGNOSIS — R140 Abdominal distension (gaseous): Secondary | ICD-10-CM | POA: Diagnosis present

## 2024-11-19 DIAGNOSIS — D72825 Bandemia: Secondary | ICD-10-CM | POA: Diagnosis not present

## 2024-11-19 DIAGNOSIS — K449 Diaphragmatic hernia without obstruction or gangrene: Secondary | ICD-10-CM | POA: Diagnosis present

## 2024-11-19 DIAGNOSIS — E1165 Type 2 diabetes mellitus with hyperglycemia: Secondary | ICD-10-CM | POA: Diagnosis present

## 2024-11-19 DIAGNOSIS — N401 Enlarged prostate with lower urinary tract symptoms: Secondary | ICD-10-CM | POA: Diagnosis present

## 2024-11-19 DIAGNOSIS — Z7982 Long term (current) use of aspirin: Secondary | ICD-10-CM

## 2024-11-19 DIAGNOSIS — N179 Acute kidney failure, unspecified: Secondary | ICD-10-CM | POA: Diagnosis present

## 2024-11-19 DIAGNOSIS — I10 Essential (primary) hypertension: Secondary | ICD-10-CM | POA: Diagnosis present

## 2024-11-19 DIAGNOSIS — K819 Cholecystitis, unspecified: Secondary | ICD-10-CM | POA: Diagnosis not present

## 2024-11-19 DIAGNOSIS — Z8711 Personal history of peptic ulcer disease: Secondary | ICD-10-CM

## 2024-11-19 DIAGNOSIS — K269 Duodenal ulcer, unspecified as acute or chronic, without hemorrhage or perforation: Secondary | ICD-10-CM | POA: Diagnosis present

## 2024-11-19 DIAGNOSIS — I160 Hypertensive urgency: Secondary | ICD-10-CM | POA: Diagnosis present

## 2024-11-19 DIAGNOSIS — I358 Other nonrheumatic aortic valve disorders: Secondary | ICD-10-CM | POA: Diagnosis present

## 2024-11-19 DIAGNOSIS — R7989 Other specified abnormal findings of blood chemistry: Secondary | ICD-10-CM | POA: Diagnosis not present

## 2024-11-19 DIAGNOSIS — I2510 Atherosclerotic heart disease of native coronary artery without angina pectoris: Secondary | ICD-10-CM | POA: Diagnosis present

## 2024-11-19 DIAGNOSIS — R1912 Hyperactive bowel sounds: Secondary | ICD-10-CM | POA: Diagnosis present

## 2024-11-19 DIAGNOSIS — K2100 Gastro-esophageal reflux disease with esophagitis, without bleeding: Secondary | ICD-10-CM | POA: Diagnosis present

## 2024-11-19 DIAGNOSIS — A419 Sepsis, unspecified organism: Secondary | ICD-10-CM | POA: Diagnosis not present

## 2024-11-19 DIAGNOSIS — K2980 Duodenitis without bleeding: Secondary | ICD-10-CM | POA: Diagnosis not present

## 2024-11-19 DIAGNOSIS — K8309 Other cholangitis: Secondary | ICD-10-CM | POA: Diagnosis present

## 2024-11-19 DIAGNOSIS — I5022 Chronic systolic (congestive) heart failure: Secondary | ICD-10-CM

## 2024-11-19 DIAGNOSIS — K315 Obstruction of duodenum: Secondary | ICD-10-CM | POA: Diagnosis present

## 2024-11-19 DIAGNOSIS — R109 Unspecified abdominal pain: Secondary | ICD-10-CM | POA: Diagnosis present

## 2024-11-19 DIAGNOSIS — Z886 Allergy status to analgesic agent status: Secondary | ICD-10-CM

## 2024-11-19 DIAGNOSIS — R4182 Altered mental status, unspecified: Secondary | ICD-10-CM | POA: Diagnosis not present

## 2024-11-19 DIAGNOSIS — Z79899 Other long term (current) drug therapy: Secondary | ICD-10-CM

## 2024-11-19 DIAGNOSIS — K831 Obstruction of bile duct: Principal | ICD-10-CM | POA: Diagnosis present

## 2024-11-19 DIAGNOSIS — K264 Chronic or unspecified duodenal ulcer with hemorrhage: Secondary | ICD-10-CM | POA: Diagnosis not present

## 2024-11-19 DIAGNOSIS — I5043 Acute on chronic combined systolic (congestive) and diastolic (congestive) heart failure: Secondary | ICD-10-CM | POA: Diagnosis present

## 2024-11-19 DIAGNOSIS — Z555 Less than a high school diploma: Secondary | ICD-10-CM

## 2024-11-19 DIAGNOSIS — I4891 Unspecified atrial fibrillation: Secondary | ICD-10-CM | POA: Diagnosis not present

## 2024-11-19 DIAGNOSIS — R6520 Severe sepsis without septic shock: Secondary | ICD-10-CM | POA: Diagnosis present

## 2024-11-19 DIAGNOSIS — K91841 Postprocedural hemorrhage and hematoma of a digestive system organ or structure following other procedure: Secondary | ICD-10-CM

## 2024-11-19 DIAGNOSIS — K810 Acute cholecystitis: Secondary | ICD-10-CM | POA: Diagnosis present

## 2024-11-19 DIAGNOSIS — K862 Cyst of pancreas: Secondary | ICD-10-CM | POA: Diagnosis present

## 2024-11-19 DIAGNOSIS — K828 Other specified diseases of gallbladder: Secondary | ICD-10-CM | POA: Diagnosis present

## 2024-11-19 DIAGNOSIS — Z888 Allergy status to other drugs, medicaments and biological substances status: Secondary | ICD-10-CM

## 2024-11-19 DIAGNOSIS — Z885 Allergy status to narcotic agent status: Secondary | ICD-10-CM

## 2024-11-19 DIAGNOSIS — Z9181 History of falling: Secondary | ICD-10-CM

## 2024-11-19 DIAGNOSIS — C259 Malignant neoplasm of pancreas, unspecified: Secondary | ICD-10-CM | POA: Diagnosis not present

## 2024-11-19 DIAGNOSIS — Z833 Family history of diabetes mellitus: Secondary | ICD-10-CM

## 2024-11-19 DIAGNOSIS — R54 Age-related physical debility: Secondary | ICD-10-CM | POA: Diagnosis present

## 2024-11-19 DIAGNOSIS — D649 Anemia, unspecified: Secondary | ICD-10-CM | POA: Diagnosis present

## 2024-11-19 DIAGNOSIS — Z781 Physical restraint status: Secondary | ICD-10-CM

## 2024-11-19 DIAGNOSIS — I899 Noninfective disorder of lymphatic vessels and lymph nodes, unspecified: Secondary | ICD-10-CM | POA: Diagnosis not present

## 2024-11-19 DIAGNOSIS — I25810 Atherosclerosis of coronary artery bypass graft(s) without angina pectoris: Secondary | ICD-10-CM | POA: Diagnosis not present

## 2024-11-19 DIAGNOSIS — D638 Anemia in other chronic diseases classified elsewhere: Secondary | ICD-10-CM | POA: Diagnosis present

## 2024-11-19 DIAGNOSIS — E1151 Type 2 diabetes mellitus with diabetic peripheral angiopathy without gangrene: Secondary | ICD-10-CM | POA: Diagnosis present

## 2024-11-19 DIAGNOSIS — H5712 Ocular pain, left eye: Secondary | ICD-10-CM | POA: Diagnosis present

## 2024-11-19 DIAGNOSIS — Z794 Long term (current) use of insulin: Secondary | ICD-10-CM | POA: Diagnosis not present

## 2024-11-19 DIAGNOSIS — R7689 Other specified abnormal immunological findings in serum: Secondary | ICD-10-CM | POA: Diagnosis present

## 2024-11-19 DIAGNOSIS — Z8507 Personal history of malignant neoplasm of pancreas: Secondary | ICD-10-CM

## 2024-11-19 DIAGNOSIS — G9341 Metabolic encephalopathy: Secondary | ICD-10-CM | POA: Diagnosis present

## 2024-11-19 DIAGNOSIS — R591 Generalized enlarged lymph nodes: Secondary | ICD-10-CM | POA: Diagnosis present

## 2024-11-19 DIAGNOSIS — R7401 Elevation of levels of liver transaminase levels: Secondary | ICD-10-CM | POA: Diagnosis not present

## 2024-11-19 DIAGNOSIS — R680 Hypothermia, not associated with low environmental temperature: Secondary | ICD-10-CM | POA: Diagnosis present

## 2024-11-19 DIAGNOSIS — K5900 Constipation, unspecified: Secondary | ICD-10-CM | POA: Diagnosis present

## 2024-11-19 DIAGNOSIS — Z681 Body mass index (BMI) 19 or less, adult: Secondary | ICD-10-CM

## 2024-11-19 DIAGNOSIS — E8809 Other disorders of plasma-protein metabolism, not elsewhere classified: Secondary | ICD-10-CM | POA: Diagnosis present

## 2024-11-19 DIAGNOSIS — Z951 Presence of aortocoronary bypass graft: Secondary | ICD-10-CM

## 2024-11-19 DIAGNOSIS — Z8249 Family history of ischemic heart disease and other diseases of the circulatory system: Secondary | ICD-10-CM

## 2024-11-19 DIAGNOSIS — I25119 Atherosclerotic heart disease of native coronary artery with unspecified angina pectoris: Secondary | ICD-10-CM | POA: Diagnosis not present

## 2024-11-19 DIAGNOSIS — G4733 Obstructive sleep apnea (adult) (pediatric): Secondary | ICD-10-CM | POA: Diagnosis present

## 2024-11-19 DIAGNOSIS — I5020 Unspecified systolic (congestive) heart failure: Secondary | ICD-10-CM | POA: Diagnosis not present

## 2024-11-19 DIAGNOSIS — K209 Esophagitis, unspecified without bleeding: Secondary | ICD-10-CM | POA: Diagnosis not present

## 2024-11-19 DIAGNOSIS — R1031 Right lower quadrant pain: Secondary | ICD-10-CM | POA: Diagnosis not present

## 2024-11-19 DIAGNOSIS — I739 Peripheral vascular disease, unspecified: Secondary | ICD-10-CM | POA: Diagnosis not present

## 2024-11-19 DIAGNOSIS — K30 Functional dyspepsia: Secondary | ICD-10-CM | POA: Diagnosis present

## 2024-11-19 DIAGNOSIS — R948 Abnormal results of function studies of other organs and systems: Secondary | ICD-10-CM | POA: Diagnosis present

## 2024-11-19 LAB — URINALYSIS, ROUTINE W REFLEX MICROSCOPIC
Bilirubin Urine: NEGATIVE
Glucose, UA: >=500 mg/dL — AB
Ketones, ur: NEGATIVE mg/dL
Leukocytes,Ua: NEGATIVE
Nitrite: NEGATIVE
Protein, ur: >=300 mg/dL — AB
Specific Gravity, Urine: 1.011 (ref 1.005–1.030)
pH: 5 (ref 5.0–8.0)

## 2024-11-19 LAB — I-STAT VENOUS BLOOD GAS, ED
Acid-base deficit: 1 mmol/L (ref 0.0–2.0)
Bicarbonate: 23.2 mmol/L (ref 20.0–28.0)
Calcium, Ion: 1.11 mmol/L — ABNORMAL LOW (ref 1.15–1.40)
HCT: 25 % — ABNORMAL LOW (ref 39.0–52.0)
Hemoglobin: 8.5 g/dL — ABNORMAL LOW (ref 13.0–17.0)
O2 Saturation: 79 %
Potassium: 3.9 mmol/L (ref 3.5–5.1)
Sodium: 134 mmol/L — ABNORMAL LOW (ref 135–145)
TCO2: 24 mmol/L (ref 22–32)
pCO2, Ven: 36.9 mmHg — ABNORMAL LOW (ref 44–60)
pH, Ven: 7.406 (ref 7.25–7.43)
pO2, Ven: 43 mmHg (ref 32–45)

## 2024-11-19 LAB — CBC
HCT: 26.4 % — ABNORMAL LOW (ref 39.0–52.0)
Hemoglobin: 8.7 g/dL — ABNORMAL LOW (ref 13.0–17.0)
MCH: 32.1 pg (ref 26.0–34.0)
MCHC: 33 g/dL (ref 30.0–36.0)
MCV: 97.4 fL (ref 80.0–100.0)
Platelets: 261 K/uL (ref 150–400)
RBC: 2.71 MIL/uL — ABNORMAL LOW (ref 4.22–5.81)
RDW: 12.9 % (ref 11.5–15.5)
WBC: 12.1 K/uL — ABNORMAL HIGH (ref 4.0–10.5)
nRBC: 0 % (ref 0.0–0.2)

## 2024-11-19 LAB — COMPREHENSIVE METABOLIC PANEL WITH GFR
ALT: 35 U/L (ref 0–44)
AST: 51 U/L — ABNORMAL HIGH (ref 15–41)
Albumin: 2.5 g/dL — ABNORMAL LOW (ref 3.5–5.0)
Alkaline Phosphatase: 274 U/L — ABNORMAL HIGH (ref 38–126)
Anion gap: 11 (ref 5–15)
BUN: 40 mg/dL — ABNORMAL HIGH (ref 8–23)
CO2: 21 mmol/L — ABNORMAL LOW (ref 22–32)
Calcium: 8.4 mg/dL — ABNORMAL LOW (ref 8.9–10.3)
Chloride: 102 mmol/L (ref 98–111)
Creatinine, Ser: 3.02 mg/dL — ABNORMAL HIGH (ref 0.61–1.24)
GFR, Estimated: 21 mL/min — ABNORMAL LOW (ref >60)
Glucose, Bld: 357 mg/dL — ABNORMAL HIGH (ref 70–99)
Potassium: 3.9 mmol/L (ref 3.5–5.1)
Sodium: 134 mmol/L — ABNORMAL LOW (ref 135–145)
Total Bilirubin: 1.1 mg/dL (ref 0.0–1.2)
Total Protein: 6.1 g/dL — ABNORMAL LOW (ref 6.5–8.1)

## 2024-11-19 LAB — LIPASE, BLOOD: Lipase: 28 U/L (ref 11–51)

## 2024-11-19 LAB — GLUCOSE, CAPILLARY
Glucose-Capillary: 344 mg/dL — ABNORMAL HIGH (ref 70–99)
Glucose-Capillary: 254 mg/dL — ABNORMAL HIGH (ref 70–99)
Glucose-Capillary: 182 mg/dL — ABNORMAL HIGH (ref 70–99)
Glucose-Capillary: 76 mg/dL (ref 70–99)

## 2024-11-19 MED ORDER — HEPARIN BOLUS VIA INFUSION
4000.0000 [IU] | Freq: Once | INTRAVENOUS | Status: AC
  Administered 2024-11-19: 4000 [IU] via INTRAVENOUS
  Filled 2024-11-19: qty 4000

## 2024-11-19 MED ORDER — HEPARIN (PORCINE) 25000 UT/250ML-% IV SOLN: 1200.0000 [IU]/h | INTRAVENOUS | Status: DC

## 2024-11-19 MED ORDER — ACETAMINOPHEN 650 MG RE SUPP: 650.0000 mg | Freq: Four times a day (QID) | RECTAL | Status: DC

## 2024-11-19 MED ORDER — LIDOCAINE 5 % EX PTCH
3.0000 | MEDICATED_PATCH | CUTANEOUS | Status: DC
  Administered 2024-11-20 – 2024-11-26 (×5): 3 via TRANSDERMAL
  Administered 2024-11-28: 1 via TRANSDERMAL
  Administered 2024-11-30 – 2024-12-05 (×3): 3 via TRANSDERMAL
  Filled 2024-11-19 (×17): qty 3

## 2024-11-19 MED ORDER — ONDANSETRON HCL 4 MG/2ML IJ SOLN
4.0000 mg | Freq: Once | INTRAMUSCULAR | Status: AC
  Administered 2024-11-19: 4 mg via INTRAVENOUS
  Filled 2024-11-19: qty 2

## 2024-11-19 MED ORDER — PIPERACILLIN-TAZOBACTAM 3.375 G IVPB
3.3750 g | Freq: Three times a day (TID) | INTRAVENOUS | Status: DC
  Administered 2024-11-19 – 2024-11-22 (×8): 3.375 g via INTRAVENOUS
  Filled 2024-11-19 (×8): qty 50

## 2024-11-19 MED ORDER — ASPIRIN EC 81 MG PO TBEC
81.0000 mg | DELAYED_RELEASE_TABLET | Freq: Every day | ORAL | Status: DC
  Administered 2024-11-20 – 2024-11-25 (×5): 81 mg via ORAL
  Filled 2024-11-19 (×6): qty 1

## 2024-11-19 MED ORDER — ACETAMINOPHEN 500 MG PO TABS
1000.0000 mg | ORAL_TABLET | Freq: Four times a day (QID) | ORAL | Status: DC
  Administered 2024-11-19 – 2024-11-20 (×3): 1000 mg via ORAL
  Filled 2024-11-19 (×3): qty 2

## 2024-11-19 MED ORDER — INSULIN GLARGINE 100 UNIT/ML ~~LOC~~ SOLN
5.0000 [IU] | Freq: Once | SUBCUTANEOUS | Status: AC
  Administered 2024-11-19: 5 [IU] via SUBCUTANEOUS
  Filled 2024-11-19: qty 0.05

## 2024-11-19 MED ORDER — DICYCLOMINE HCL 10 MG PO CAPS
10.0000 mg | ORAL_CAPSULE | Freq: Once | ORAL | Status: AC
  Administered 2024-11-19: 10 mg via ORAL
  Filled 2024-11-19: qty 1

## 2024-11-19 MED ORDER — HEPARIN (PORCINE) 25000 UT/250ML-% IV SOLN
1200.0000 [IU]/h | INTRAVENOUS | Status: AC
  Administered 2024-11-19 – 2024-11-20 (×2): 1200 [IU]/h via INTRAVENOUS
  Administered 2024-11-21: 1050 [IU]/h via INTRAVENOUS
  Filled 2024-11-19 (×3): qty 250

## 2024-11-19 MED ORDER — LACTATED RINGERS IV BOLUS
1000.0000 mL | Freq: Once | INTRAVENOUS | Status: AC
  Administered 2024-11-19: 1000 mL via INTRAVENOUS

## 2024-11-19 MED ORDER — MORPHINE SULFATE (PF) 4 MG/ML IV SOLN
4.0000 mg | Freq: Once | INTRAVENOUS | Status: AC
  Administered 2024-11-19: 4 mg via INTRAVENOUS
  Filled 2024-11-19: qty 1

## 2024-11-19 MED ORDER — INSULIN ASPART 100 UNIT/ML IJ SOLN
0.0000 [IU] | Freq: Three times a day (TID) | INTRAMUSCULAR | Status: DC
  Administered 2024-11-19: 3 [IU] via SUBCUTANEOUS
  Administered 2024-11-20 (×2): 1 [IU] via SUBCUTANEOUS
  Administered 2024-11-20 – 2024-11-21 (×2): 2 [IU] via SUBCUTANEOUS
  Administered 2024-11-21: 3 [IU] via SUBCUTANEOUS
  Administered 2024-11-21: 2 [IU] via SUBCUTANEOUS
  Administered 2024-11-22: 4 [IU] via SUBCUTANEOUS
  Filled 2024-11-19 (×2): qty 3
  Filled 2024-11-19: qty 2
  Filled 2024-11-19: qty 1
  Filled 2024-11-19: qty 3
  Filled 2024-11-19: qty 4
  Filled 2024-11-19: qty 2

## 2024-11-19 MED ORDER — ACETAMINOPHEN 325 MG PO TABS: 650.0000 mg | ORAL_TABLET | Freq: Four times a day (QID) | ORAL | Status: DC | PRN

## 2024-11-19 MED ORDER — HYDROMORPHONE HCL 1 MG/ML IJ SOLN
1.0000 mg | INTRAMUSCULAR | Status: DC | PRN
  Administered 2024-11-19 – 2024-11-20 (×4): 1 mg via INTRAVENOUS
  Filled 2024-11-19 (×4): qty 1

## 2024-11-19 MED ORDER — PIPERACILLIN-TAZOBACTAM 3.375 G IVPB 30 MIN
3.3750 g | Freq: Once | INTRAVENOUS | Status: AC
  Administered 2024-11-19: 3.375 g via INTRAVENOUS
  Filled 2024-11-19: qty 50

## 2024-11-19 MED ORDER — ACETAMINOPHEN 650 MG RE SUPP: 650.0000 mg | Freq: Four times a day (QID) | RECTAL | Status: DC | PRN

## 2024-11-19 MED ORDER — DEXTROSE 50 % IV SOLN
25.0000 mL | Freq: Once | INTRAVENOUS | Status: AC
  Administered 2024-11-19: 25 mL via INTRAVENOUS
  Filled 2024-11-19: qty 50

## 2024-11-19 MED ORDER — PANTOPRAZOLE SODIUM 40 MG PO TBEC
40.0000 mg | DELAYED_RELEASE_TABLET | Freq: Every day | ORAL | Status: DC
  Administered 2024-11-19 – 2024-11-26 (×7): 40 mg via ORAL
  Filled 2024-11-19 (×7): qty 1

## 2024-11-19 MED ORDER — LACTATED RINGERS IV SOLN: INTRAVENOUS | Status: AC

## 2024-11-19 MED ORDER — DILTIAZEM HCL ER COATED BEADS 180 MG PO CP24
180.0000 mg | ORAL_CAPSULE | Freq: Every day | ORAL | Status: DC
  Administered 2024-11-19 – 2024-11-22 (×4): 180 mg via ORAL
  Filled 2024-11-19 (×4): qty 1

## 2024-11-19 MED ORDER — PANTOPRAZOLE SODIUM 40 MG IV SOLR
40.0000 mg | Freq: Once | INTRAVENOUS | Status: AC
  Administered 2024-11-19: 40 mg via INTRAVENOUS
  Filled 2024-11-19: qty 10

## 2024-11-19 MED ORDER — TAMSULOSIN HCL 0.4 MG PO CAPS
0.4000 mg | ORAL_CAPSULE | Freq: Every day | ORAL | Status: DC
  Administered 2024-11-19 – 2024-11-21 (×3): 0.4 mg via ORAL
  Filled 2024-11-19 (×3): qty 1

## 2024-11-19 MED ORDER — ALUM & MAG HYDROXIDE-SIMETH 200-200-20 MG/5ML PO SUSP
15.0000 mL | Freq: Once | ORAL | Status: AC
  Administered 2024-11-19: 15 mL via ORAL
  Filled 2024-11-19: qty 30

## 2024-11-19 MED ORDER — HEPARIN BOLUS VIA INFUSION
4000.0000 [IU] | Freq: Once | INTRAVENOUS | Status: DC
  Filled 2024-11-19: qty 4000

## 2024-11-19 MED ORDER — INSULIN ASPART PROT & ASPART (70-30 MIX) 100 UNIT/ML ~~LOC~~ SUSP: 6.0000 [IU] | Freq: Once | SUBCUTANEOUS | Status: DC

## 2024-11-19 MED ORDER — CARVEDILOL 6.25 MG PO TABS
6.2500 mg | ORAL_TABLET | Freq: Two times a day (BID) | ORAL | Status: DC
  Administered 2024-11-20 – 2024-11-22 (×5): 6.25 mg via ORAL
  Filled 2024-11-19 (×6): qty 1

## 2024-11-19 MED ORDER — INSULIN ASPART 100 UNIT/ML IJ SOLN
3.0000 [IU] | Freq: Once | INTRAMUSCULAR | Status: AC
  Administered 2024-11-19: 3 [IU] via SUBCUTANEOUS
  Filled 2024-11-19: qty 3

## 2024-11-19 NOTE — Assessment & Plan Note (Addendum)
-   Admit to [facility] Med Surg with attending Dr. Orie - NPO - mIVF 100cc/hr LR while NPO.  - Gen Surg consulted, appreciate recs - Pain regimen:  - Dilaudid  1mg  q4h prn for pain - Start IV zosyn  for intra-abdominal coverage  - f/u MRCP  - consult GI if abnormal - tylenol  prn for pain or fever

## 2024-11-19 NOTE — Plan of Care (Signed)

## 2024-11-19 NOTE — Assessment & Plan Note (Signed)
 HTN/CAD/CHF: Continue home Coreg   BPH: Continue home tamsulosin  GERD: Continue home Protonix 

## 2024-11-19 NOTE — TOC CM/SW Note (Signed)
 Transition of Care [facility]) - Inpatient Brief Assessment   Patient Details  Name: Colin Keller MRN: [id number] Date of Birth: 26-Jun-1956  [facility] CM/SW Contact:    Lauraine FORBES Saa, LCSWA Phone Number: 11/19/2024, 4:38 PM   Clinical Narrative:  4:39 PM Per chart review, patient resides at home with spouse. Patient has a PCP and insurance. Patient does not have SNF or HH history. Patient has DME (supplies for diabetes management) history with [facility]. Patient's preferred pharmacy's are [facility], [facility] [address], [facility] [address], and [place]. No TOC needs identified at this time. TOC will continue to follow.  Transition of Care Asessment: Insurance and Status: Insurance coverage has been reviewed Patient has primary care physician: Yes Home environment has been reviewed: Private Residence Prior level of function:: N/A Prior/Current Home Services: No current home services Social Drivers of Health Review: SDOH reviewed no interventions necessary Readmission risk has been reviewed: Yes (Currently Yellow 18%) Transition of care needs: no transition of care needs at this time

## 2024-11-19 NOTE — Consult Note (Addendum)
 Consult Note  Colin Keller February 14, 1956  [id number].    Requesting MD:  Dr. Fairy Gravely, DO  Chief Complaint/Reason for Consult:  Abdominal pain  HPI:  Colin Keller is a 68 year old male with a PMH of recently diagnosed PE on Eliquis , T2DM on insulin , HTN, HLD, CHF coronary artery disease, hx of CABG x 3, CKD, and GERD that presented to the ED with abdominal pain. Patient explained that he has been having abdominal pain that began of the lower left abdomen and is now prominent of the RUQ. Pain has been present for at least 1 week prior to his previous hospital admission. Additionally, he reports that he has had similar episodes of pain in the past. He denies n/v. Last BM was yesterday. His last food intake was a minimal amount of dinner yesterday evening. Denies CP, SOB, fevers.  Patient work up included labs and imaging in the ED. Labs are: -Lipase 28 -CMP with abnormalities of: sodium 134, Co2 21, Glucose 357, BUN 40, Cr 3.02, Calcium  8.4, Total protein 6.1, Albumin  2.5, AST 51, Alk phos 274, GFR 21 -CBC with WBC 12.1 and HG 8.7 -UA with glucose, small hgb, protein >=300, and rare bacteria -Stat venous blood gas abnormalities pCO2, ven 36.9, sodium 134, calcium  ion 1.11, HCT 25.0, HGB 8.5  CT imaging and ultrasound showed concerns of thickening and CBD dilation (Full reports listed below). General surgery was asked to consult.    Surgical history: TEE without cardioversion, Left peripheral vascular balloon angioplasty/atherectomy (2023), Left heart cath and coronary angiography (2019) Anticoagulation/Antiplatelet medications: Eliquis  that was last taken 12/8 around 9:30PM; Takes 81 mg Aspirin  but has not taken this in at least 2 days.  Tobacco use: Has not smoked since previous admission. Smoking 1ppd Alcohol use: Rarley Illicit drug use: Denies  ROS: Per HPI  Family History  Problem Relation Age of Onset   Heart disease Mother    Diabetes Mother    Heart failure  Mother    Heart disease Father    Sudden death Brother    Drug abuse Son     Past Medical History:  Diagnosis Date   Acute diastolic heart failure (HCC) 08/18/2018   AKI (acute kidney injury) 10/04/2023   Atherosclerosis of both carotid arteries 08/18/2020   Carotid dopplers 08/2019 40-59% bilateral stenosis and right subclavian stenosis   Bilateral carotid artery stenosis 08/18/2020   Right Carotid: Velocities in the right ICA are consistent with a 60-79%                 stenosis. Non-hemodynamically significant plaque <50% noted in                 the CCA. The ECA appears >50% stenosed. Proximal subclavian                 artery dilatation with elevated and turbulent flow just past the                 narrowing, measuring 1.6 cm.   Left Carotid: Velocities in the left ICA are    CHF (congestive heart failure) (HCC)    Chronic congestive heart failure (HCC) 10/04/2023   Transthoracic echocardiogram 10/08/2018: LVEF is approximately 50% with hypokinesis of the lateral (base/mid) and basal inferior walls T     Chronic left shoulder pain 03/18/2021   Chronic low back pain without sciatica 10/16/2008   H/o chronic LBP with h/o lumbar disc herniation and intermittent radicular pain  Chronic pain, on stable doses of Norco 10/325 TID. MRI in 2003 showing SMALL CENTRAL DISC HERNIATION AT L4-5.  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      CKD (chronic kidney disease), stage III (HCC)    Coronary artery disease    Coronary artery disease involving native heart with angina pectoris, unspecified vessel or lesion type 08/10/2023   COVID 10/04/2023   Diabetes mellitus    DKA (diabetic ketoacidosis) (HCC) 10/03/2023   Dyspnea    Fall 10/04/2023   GERD (gastroesophageal reflux disease)    Hx of CABG 08/23/2018   LIMA to LAD, RIMA to RCA, SVG to OM3, EVH via right thigh   Hyperlipidemia    Hypertension    Hypertension associated with diabetes (HCC) 12/22/2008   Qualifier:  Diagnosis of  By: Edrick MD, Susan     Hypertensive nephropathy 08/18/2020   Insulin  dependent type 2 diabetes mellitus (HCC) 12/22/2020   Left ventricular dysfunction 08/18/2020   TTE ([id number]): LVEF is approximately 50% with hypokinesis of the lateral (base/mid) and basal inferior walls The cavity size was normal.      Long term (current) use of antithrombotics/antiplatelets 09/21/2022   Planned 30-months DAPT for DES for PAD on 09/21/2022.  Planned 6 months therapy. End 03/23/23.   Peripheral artery disease 08/31/2021   [facility] Cardiology Vascular Study lower extremities 08/30/21:            Today's ABI  Today's TBI   Previous ABIPrevious TBI  +-------+-----------+-----------+------------+------------+  Right  0.76       0.31       1.03                      +-------+-----------+-----------+------------+------------+  Left   0.63       0.20       0.95                      +-------+-----------+---------   Proteinuria 08/18/2020   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   LIMA to LAD, RIMA to RCA, SVG to OM3, EVH via right thigh   Sleeping difficulties 09/08/2019   Subclavian artery stenosis, right 08/18/2020   Carotid dopplers 08/2019 40-59% bilateral stenosis and right subclavian stenosis   Tobacco abuse    Tobacco abuse disorder    Qualifier: Diagnosis of  By: Edrick MD, Devere      Past Surgical History:  Procedure Laterality Date   ABDOMINAL AORTOGRAM W/LOWER EXTREMITY N/A 09/21/2022   Procedure: ABDOMINAL AORTOGRAM W/LOWER EXTREMITY;  Surgeon: Darron Deatrice LABOR, MD;  Location: [facility] INVASIVE CV LAB;  Service: Cardiovascular;  Laterality: N/A;   CORONARY ARTERY BYPASS GRAFT N/A 08/23/2018   Procedure: CORONARY ARTERY BYPASS GRAFTING (CABG) x 3; -Left Internal Mammary Artery to Left Anterior Descending Artery, -Right Internal Mammary Artery to Right Coronary Artery, -Saphenous Vein Graft to Obtuse Marginal;  ENDOSCOPIC HARVEST GREATER SAPHENOUS VEIN  -Right Thigh;  Surgeon: Dusty Sudie DEL, MD;  Location: [facility] OR;  Service: Open Heart Surgery;  Laterality: N/A;   CORONARY PRESSURE/FFR STUDY N/A 08/20/2018   Procedure: INTRAVASCULAR PRESSURE WIRE/FFR STUDY;  Surgeon: Wonda Sharper, MD;  Location: [facility] INVASIVE CV LAB;  Service: Cardiovascular;  Laterality: N/A;   LEFT HEART CATH AND CORONARY ANGIOGRAPHY N/A 08/20/2018   Procedure: LEFT HEART CATH AND CORONARY ANGIOGRAPHY;  Surgeon: Wonda Sharper, MD;  Location: [facility] INVASIVE CV LAB;  Service: Cardiovascular;  Laterality: N/A;   PERIPHERAL VASCULAR ATHERECTOMY Left 09/21/2022  Procedure: PERIPHERAL VASCULAR ATHERECTOMY;  Surgeon: Darron Deatrice LABOR, MD;  Location: [facility] INVASIVE CV LAB;  Service: Cardiovascular;  Laterality: Left;  SFA   PERIPHERAL VASCULAR BALLOON ANGIOPLASTY Left 09/21/2022   Procedure: PERIPHERAL VASCULAR BALLOON ANGIOPLASTY;  Surgeon: Darron Deatrice LABOR, MD;  Location: [facility] INVASIVE CV LAB;  Service: Cardiovascular;  Laterality: Left;  SFA   TEE WITHOUT CARDIOVERSION N/A 08/23/2018   Procedure: TRANSESOPHAGEAL ECHOCARDIOGRAM (TEE);  Surgeon: Dusty Sudie DEL, MD;  Location: [facility] OR;  Service: Open Heart Surgery;  Laterality: N/A;    Social History:  reports that he has been smoking cigarettes. He started smoking about 54 years ago. He has a 48 pack-year smoking history. He has never used smokeless tobacco. He reports that he does not drink alcohol and does not use drugs.  Allergies:  Allergies  Allergen Reactions   Amlodipine  Other (See Comments)    Leg swelling   Empagliflozin  Other (See Comments)    Euglycemic DKA   Amitriptyline  Other (See Comments)    Urinary retention   Lisinopril  Other (See Comments)    Hyperkalemia    Nortriptyline  Hcl Other (See Comments)    Vivid / bad dreams   Statins     Aches in Joints    Simvastatin  Other (See Comments)    Arthralgias    (Not in a hospital admission)   Blood pressure (!) 177/79, pulse 64, temperature 97.8 F (36.6 C), temperature source Oral, resp. rate 16,  SpO2 100%. Physical Exam:  General: Pleasant male who is laying in bed in NAD. HEENT: Head is normocephalic, atraumatic. Heart: HR normal during encounter.  Lungs: Respiratory effort nonlabored on room air. Abd: Soft, ND. Tenderness to palpation that is prominent of RUQ, RLQ, and epigastric region. +BS. No rebound tenderness or guarding.  MS: Able to move all 4 extremities.  Skin: Warm and dry. Slightly jaundiced. Psych: A&Ox3 with an appropriate affect.   Results for orders placed or performed during the hospital encounter of 11/19/24 (from the past 48 hours)  Lipase, blood     Status: None   Collection Time: 11/19/24  9:17 AM  Result Value Ref Range   Lipase 28 11 - 51 U/L    Comment: Performed at [facility] Lab, [address] [place], [place] [address]  Comprehensive metabolic panel     Status: Abnormal   Collection Time: 11/19/24  9:17 AM  Result Value Ref Range   Sodium 134 (L) 135 - 145 mmol/L   Potassium 3.9 3.5 - 5.1 mmol/L   Chloride 102 98 - 111 mmol/L   CO2 21 (L) 22 - 32 mmol/L   Glucose, Bld 357 (H) 70 - 99 mg/dL    Comment: Glucose reference range applies only to samples taken after fasting for at least 8 hours.   BUN 40 (H) 8 - 23 mg/dL   Creatinine, Ser 6.97 (H) 0.61 - 1.24 mg/dL   Calcium  8.4 (L) 8.9 - 10.3 mg/dL   Total Protein 6.1 (L) 6.5 - 8.1 g/dL   Albumin  2.5 (L) 3.5 - 5.0 g/dL   AST 51 (H) 15 - 41 U/L   ALT 35 0 - 44 U/L   Alkaline Phosphatase 274 (H) 38 - 126 U/L   Total Bilirubin 1.1 0.0 - 1.2 mg/dL   GFR, Estimated 21 (L) >60 mL/min    Comment: (NOTE) Calculated using the CKD-EPI Creatinine Equation (2021)    Anion gap 11 5 - 15    Comment: Performed at [facility], [address] [place], [place] [address]  CBC     Status: Abnormal   Collection Time: 11/19/24  9:17 AM  Result Value Ref Range   WBC 12.1 (H) 4.0 - 10.5 K/uL   RBC 2.71 (L) 4.22 - 5.81 MIL/uL   Hemoglobin 8.7 (L) 13.0 - 17.0 g/dL   HCT 73.5 (L) 60.9 - 47.9 %   MCV  97.4 80.0 - 100.0 fL   MCH 32.1 26.0 - 34.0 pg   MCHC 33.0 30.0 - 36.0 g/dL   RDW 87.0 88.4 - 84.4 %   Platelets 261 150 - 400 K/uL   nRBC 0.0 0.0 - 0.2 %    Comment: Performed at [facility] Lab, [address] [place], [place] [address]  Urinalysis, Routine w reflex microscopic -Urine, Clean Catch     Status: Abnormal   Collection Time: 11/19/24  9:28 AM  Result Value Ref Range   Color, Urine YELLOW YELLOW   APPearance HAZY (A) CLEAR   Specific Gravity, Urine 1.011 1.005 - 1.030   pH 5.0 5.0 - 8.0   Glucose, UA >=500 (A) NEGATIVE mg/dL   Hgb urine dipstick SMALL (A) NEGATIVE   Bilirubin Urine NEGATIVE NEGATIVE   Ketones, ur NEGATIVE NEGATIVE mg/dL   Protein, ur >=699 (A) NEGATIVE mg/dL   Nitrite NEGATIVE NEGATIVE   Leukocytes,Ua NEGATIVE NEGATIVE   RBC / HPF 6-10 0 - 5 RBC/hpf   WBC, UA 6-10 0 - 5 WBC/hpf   Bacteria, UA RARE (A) NONE SEEN   Squamous Epithelial / HPF 0-5 0 - 5 /HPF   Mucus PRESENT    Hyaline Casts, UA PRESENT     Comment: Performed at [facility], [address], [place], [place] [address]  I-Stat venous blood gas, [facility], [facility], [facility])     Status: Abnormal   Collection Time: 11/19/24 10:04 AM  Result Value Ref Range   pH, Ven 7.406 7.25 - 7.43   pCO2, Ven 36.9 (L) 44 - 60 mmHg   pO2, Ven 43 32 - 45 mmHg   Bicarbonate 23.2 20.0 - 28.0 mmol/L   TCO2 24 22 - 32 mmol/L   O2 Saturation 79 %   Acid-base deficit 1.0 0.0 - 2.0 mmol/L   Sodium 134 (L) 135 - 145 mmol/L   Potassium 3.9 3.5 - 5.1 mmol/L   Calcium , Ion 1.11 (L) 1.15 - 1.40 mmol/L   HCT 25.0 (L) 39.0 - 52.0 %   Hemoglobin 8.5 (L) 13.0 - 17.0 g/dL   Sample type VENOUS    US  Abdomen Limited RUQ (LIVER/GB) Result Date: 11/19/2024 EXAM: Right Upper Quadrant Abdominal Ultrasound 11/19/2024 11:48:11 AM TECHNIQUE: Real-time ultrasonography of the right upper quadrant of the abdomen was performed. COMPARISON: CT of the abdomen and pelvis dated 11/19/2024. CLINICAL HISTORY: Cholecystitis. FINDINGS:  LIVER: The liver demonstrates normal echogenicity. No intrahepatic biliary ductal dilatation. No evidence of mass. BILIARY SYSTEM: Gallbladder wall thickness measures 7 mm. The patient is not focally tender over the gallbladder. No pericholecystic fluid. No cholelithiasis. The common bile duct is abnormally dilated, measuring 13.5 mm. No intrahepatic biliary ductal dilatation. OTHER: No right upper quadrant ascites. IMPRESSION: 1. Gallbladder wall thickening (7 mm) without focal tenderness. 2. Dilated common bile duct (13.5 mm) without intrahepatic biliary ductal dilatation. Electronically signed by: Evalene Coho MD 11/19/2024 12:10 PM EST RP Workstation: [id number]   CT ABDOMEN PELVIS WO CONTRAST Result Date: 11/19/2024 CLINICAL DATA:  Abdominal pain. EXAM: CT ABDOMEN AND PELVIS WITHOUT CONTRAST TECHNIQUE: Multidetector CT imaging  of the abdomen and pelvis was performed following the standard protocol without IV contrast. RADIATION DOSE REDUCTION: This exam was performed according to the departmental dose-optimization program which includes automated exposure control, adjustment of the mA and/or kV according to patient size and/or use of iterative reconstruction technique. COMPARISON:  06/20/2013 FINDINGS: Lower chest: Dependent atelectasis. Hepatobiliary: No suspicious focal abnormality in the liver on this study without intravenous contrast. Gallbladder lumen is filled with high density material. This is a new finding since chest CTA 11/15/2024 and likely reflects vicarious excretion of intravenous contrast administered for that study. Gallbladder wall appears circumferentially thickened. Intrahepatic biliary duct dilatation evident. Common bile duct not well demonstrated on this noncontrast exam. Pancreas: No focal mass lesion. No dilatation of the main duct. No intraparenchymal cyst. No peripancreatic edema. Spleen: No splenomegaly. No suspicious focal mass lesion. Adrenals/Urinary Tract: No right  adrenal nodule or mass. Soft tissue nodularity in the expected location of the left adrenal gland may reflect nodular thickening, small lymph nodes, or vascular anatomy. This is not well evaluated given lack of intravenous contrast material. Both kidneys are heterogeneous in appearance. Probable punctate stones bilaterally. No hydronephrosis. No hydroureter. The urinary bladder appears normal for the degree of distention. Stomach/Bowel: Stomach is distended with fluid. Duodenum is normally positioned as is the ligament of Treitz. No small bowel wall thickening. No small bowel dilatation. The terminal ileum is normal. The appendix is not well visualized, but there is no edema or inflammation in the region of the cecal tip to suggest appendicitis. No gross colonic mass. No colonic wall thickening. Vascular/Lymphatic: There is advanced atherosclerotic calcification of the abdominal aorta without aneurysm. Possible borderline lymphadenopathy in the left para-aortic space. No pelvic sidewall lymphadenopathy. Reproductive: The prostate gland and seminal vesicles are unremarkable. Other: Small volume confluent edema is seen in the right upper quadrant of the abdomen adjacent to the gallbladder. Musculoskeletal: No worrisome lytic or sclerotic osseous abnormality. IMPRESSION: 1. Gallbladder lumen is filled with high density material, likely vicarious excretion of intravenous contrast administered for chest CTA 11/15/2024. Gallbladder wall appears circumferentially thickened with small volume confluent edema in the right upper quadrant of the abdomen adjacent to the gallbladder. Imaging features raise the question of acute cholecystitis. Right upper quadrant ultrasound may prove helpful. 2. Intrahepatic biliary duct dilatation. Common bile duct not well demonstrated on this noncontrast exam. Correlation with liver function test recommended. 3. Soft tissue nodularity in the expected location of the left adrenal gland may  reflect nodular thickening, small lymph nodes, or vascular anatomy. This is not well evaluated given lack of intravenous contrast material. 4. Possible borderline lymphadenopathy in the left para-aortic space. 5.  Aortic Atherosclerosis (ICD10-I70.0). Electronically Signed   By: Camellia Candle M.D.   On: 11/19/2024 10:44      Assessment/Plan Abdominal pain Gallbladder wall thickening CBD dilation -CT abd/pel wo contrast shows gallbladder lumen filled with high density material likely related to contrast administered for chest CTA 12/5. Gall bladder appears circumferentially thickened with small volume confluent edema in RUQ of abdomen adjacent to the gallbladder. Intrahepatic biliary duct dilatation noted.  -RUQ ultrasound shows gallbladder wall thickening. No cholelithiasis. CBD is dilated 13.5 mm. No intrahepatic biliary ductal dilatation.  -Lipase 28 normal -Elevated AST 51, Elevated from baseline ALT 35, Elevated Alk phos 274 -Tbili norm 1.1 -WBC 12.1 and HGB 8.7 -Agree with IV antibiotics.  -Discussed labs, imaging, and history with patient. We discussed bilary anatomy and concern of gallbladder wall thickening, CBD dilation, and pain that  could be concerning for cholecystitis vs choledocholithiasis. At this time, would recommend MRCP to further evaluate CBD. Should this be concerning, would recommend GI consult. If MRCP negative, would recommend HIDA scan. Due to medical history and recent diagnosis, he would not be a great surgical candidate for laparoscopic cholecystectomy. However, if there is concern of cholecystitis could benefit from perc chole drain. These recommendations were explained to the patient and his wife and they expressed understanding. Will discuss case further with attending. All questions were answered.   -No emergency surgery needed at this time.  -Will continue to follow.  Recommend admit to medicine.   FEN: Currently NPO; IVF per primary team VTE: Hold Eliquis  at  this time pending further imaging ID: Zosyn   I reviewed nursing notes, pended ED provider notes, last 24 h vitals and pain scores, last 48 h intake and output, last 24 h labs and trends, and last 24 h imaging results.  This care required high  level of medical decision making.   Marjorie Carlyon Favre, [facility] 11/19/2024, 12:42 PM Please see Amion for pager number during day hours 7:00am-4:30pm

## 2024-11-19 NOTE — Plan of Care (Addendum)
 FMTS Brief Progress Note  S:right sided mid abdominal pain and back pain. No CP, SOB, n/v. Last BM yesterday, normally every other day.    O: BP 109/84 (BP Location: Left Arm)   Pulse 72   Temp 98.3 F (36.8 C) (Oral)   Resp 18   Ht 5' 10 (1.778 m)   SpO2 99%   BMI 21.29 kg/m    General: thin male lying in bed, NAD CV: RRR, 2+ Lungs: CTAB Abd: soft, nondistended, guarding throughout the entire abdomen. No rebound. Tender over the right mid abdomen, no tenderness on the left. + bowel sounds.   A/P: MRCP with bile duct stricture and adjacent cystic foci within the pancreas recommended additional imaging, will discuss with day team for appropriate timing of further studies.    - continue zosyn  and heparin  - augment pain management with lidocaine  patches, aquathermia, scheduled tylenol , continue dilaudid  PRN - Orders reviewed. Labs for AM ordered  Alena Morrison, Elio, MD 11/19/2024, 8:34 PM PGY-1, [facility] Night Resident  Please page [phone] with questions.

## 2024-11-19 NOTE — Progress Notes (Addendum)
 PHARMACY - ANTICOAGULATION CONSULT NOTE  Pharmacy Consult for IV Heparin  Indication: pulmonary embolus  Allergies  Allergen Reactions   Amlodipine  Other (See Comments)    Leg swelling   Empagliflozin  Other (See Comments)    Euglycemic DKA   Amitriptyline  Other (See Comments)    Urinary retention   Lisinopril  Other (See Comments)    Hyperkalemia    Nortriptyline  Hcl Other (See Comments)    Vivid / bad dreams   Statins     Aches in Joints    Simvastatin  Other (See Comments)    Arthralgias    Patient Measurements:  Height: 5' 10 (177.8 cm) Weight: 69.9 kg (154 lb) IBW/kg (Calculated) : 73 HEPARIN  DW (KG): 69.9  Vital Signs: Temp: 98.3 F (36.8 C) (12/09 1459) Temp Source: Oral (12/09 1459) BP: 166/73 (12/09 1459) Pulse Rate: 67 (12/09 1459)  Labs: Recent Labs    11/17/24 0248 11/19/24 0917 11/19/24 1004  HGB 8.1* 8.7* 8.5*  HCT 24.2* 26.4* 25.0*  PLT 251 261  --   CREATININE 2.46* 3.02*  --     Estimated Creatinine Clearance: 21.7 mL/min (A) (by C-G formula based on SCr of 3.02 mg/dL (H)).   Medical History: Past Medical History:  Diagnosis Date   Acute diastolic heart failure (HCC) 08/18/2018   AKI (acute kidney injury) 10/04/2023   Atherosclerosis of both carotid arteries 08/18/2020   Carotid dopplers 08/2019 40-59% bilateral stenosis and right subclavian stenosis   Bilateral carotid artery stenosis 08/18/2020   Right Carotid: Velocities in the right ICA are consistent with a 60-79%                 stenosis. Non-hemodynamically significant plaque <50% noted in                 the CCA. The ECA appears >50% stenosed. Proximal subclavian                 artery dilatation with elevated and turbulent flow just past the                 narrowing, measuring 1.6 cm.   Left Carotid: Velocities in the left ICA are    CHF (congestive heart failure) (HCC)    Chronic congestive heart failure (HCC) 10/04/2023   Transthoracic echocardiogram 10/08/2018: LVEF is  approximately 50% with hypokinesis of the lateral (base/mid) and basal inferior walls T     Chronic left shoulder pain 03/18/2021   Chronic low back pain without sciatica 10/16/2008   H/o chronic LBP with h/o lumbar disc herniation and intermittent radicular pain  Chronic pain, on stable doses of Norco 10/325 TID. MRI in 2003 showing SMALL CENTRAL DISC HERNIATION AT L4-5.  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      CKD (chronic kidney disease), stage III (HCC)    Coronary artery disease    Coronary artery disease involving native heart with angina pectoris, unspecified vessel or lesion type 08/10/2023   COVID 10/04/2023   Diabetes mellitus    DKA (diabetic ketoacidosis) (HCC) 10/03/2023   Dyspnea    Fall 10/04/2023   GERD (gastroesophageal reflux disease)    Hx of CABG 08/23/2018   LIMA to LAD, RIMA to RCA, SVG to OM3, EVH via right thigh   Hyperlipidemia    Hypertension    Hypertension associated with diabetes (HCC) 12/22/2008   Qualifier: Diagnosis of  By: Edrick MD, Susan     Hypertensive nephropathy 08/18/2020   Insulin  dependent type 2  diabetes mellitus (HCC) 12/22/2020   Left ventricular dysfunction 08/18/2020   TTE ([id number]): LVEF is approximately 50% with hypokinesis of the lateral (base/mid) and basal inferior walls The cavity size was normal.      Long term (current) use of antithrombotics/antiplatelets 09/21/2022   Planned 51-months DAPT for DES for PAD on 09/21/2022.  Planned 6 months therapy. End 03/23/23.   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   LIMA to LAD, RIMA to RCA, SVG to OM3, EVH via right thigh   Sleeping difficulties 09/08/2019   Subclavian artery stenosis, right 08/18/2020   Carotid dopplers 08/2019 40-59% bilateral stenosis and right subclavian stenosis   Tobacco abuse    Tobacco abuse disorder    Qualifier: Diagnosis of  By: Edrick MD, Devere      Medications:  Scheduled:   aspirin   81 mg Oral Daily   carvedilol   6.25 mg Oral BID WC   diltiazem   180 mg Oral Daily   insulin  aspart  0-6 Units Subcutaneous TID WC   insulin  aspart  3 Units Subcutaneous Once   pantoprazole   40 mg Oral Daily   tamsulosin   0.4 mg Oral Daily   Infusions:   lactated ringers       Assessment: 68 years of age male with a pulmonary embolism (diagnosed 11/15/24) recently discharged on apixaban  (last dose 12/8 at 21:30 PM) admitted with abdominal pain and concern for cholecystitis vs choledocholithiasis with possible need for intervention. Pharmacy consulted to transition to IV heparin .   Hemoglobin is low stable at 8.5. Platelets are within normal limits.  Noted to have an acute on chronic kidney injury with SCr elevated (baseline ~2).   MR Abdomen - no bleeding noted.   Goal of Therapy:  Heparin  level 0.3-0.7 units/ml Monitor platelets by anticoagulation protocol: Yes   Plan:  Give 4000 units bolus x 1 Start heparin  infusion at 1200 units/hr Check anti-Xa level in 8 hours and daily while on heparin  Continue to monitor H&H and platelets  Arshawn Valdez, PharmD Please refer to [facility] for [facility] numbers 11/19/2024,4:15 PM

## 2024-11-19 NOTE — Assessment & Plan Note (Signed)
 From prior hospitalization, pt very sensitive to insulin  dosing and goes hypoglycemic easily.  - Start home glargine 10u daily - Start sSSI - CBG w/ meals - Hold home metformin 

## 2024-11-19 NOTE — Assessment & Plan Note (Addendum)
 Acute on chronic kidney injury, likely pre-renal in setting of poor PO.  - s/p 1L in ED, on mIVF as above - holding home lasix , hydral, hydrochlorothiazide , dilt - AM BMP, Mg, CBC

## 2024-11-19 NOTE — H&P (Addendum)
 Hospital Admission History and Physical Service Pager: [phone]  Patient name: Colin Keller Medical record number: [id number] Date of Birth: 11-09-1956 Age: 68 y.o. Gender: male  Primary Care Provider: Krystal McDiarmid MD Consultants: General surgery Code Status: Full Preferred Emergency Contact:  Contact Information     Name Relation Home Work Mobile   [place] Spouse [phone]  [phone]   Almond,Bethany Daughter   [phone]      Other Contacts   None on File      Chief Complaint: Abdominal Pain  Differential and Medical Decision Making:  Colin Keller is a 68 y.o. male presenting with RUQ abdominal pain.  Leading differential is choledocholithiasis given imaging finds of CBD dilation, although stone not visualized.  Pancreatitis is also considered but less likely given normal lipase.  UA not cw infxn and no urinary sx's. General surgery has evaluated patient, and recommend getting MRCP, and consulting GI if abnormal.  No surgical interventions at this time. Afebrile and hemodynamically stable.    Assessment & Plan Abdominal pain - Admit to [facility] with attending Dr. Orie - NPO - mIVF 100cc/hr LR while NPO.  - Gen Surg consulted, appreciate recs - Pain regimen:  - Dilaudid  1mg  q4h prn for pain - Start IV zosyn  for intra-abdominal coverage  - f/u MRCP  - consult GI if abnormal - tylenol  prn for pain or fever Acute pulmonary embolism (HCC) Recently discharged on eliquis . Resp status stable today. - Start heparin  drip per pharm for PE - Hold eliquis  for potential procedure T2DM (type 2 diabetes mellitus) (HCC) From prior hospitalization, pt very sensitive to insulin  dosing and goes hypoglycemic easily.  - Start home glargine 10u daily - Start sSSI - CBG w/ meals - Hold home metformin  AKI (acute kidney injury) Acute on chronic kidney injury, likely pre-renal in setting of poor PO.  - s/p 1L in ED, on mIVF as above - holding home lasix ,  hydral, hydrochlorothiazide , dilt - AM BMP, Mg, CBC Chronic health problem HTN/CAD/CHF: Continue home Coreg   BPH: Continue home tamsulosin  GERD: Continue home Protonix   FEN/GI: NPO VTE Prophylaxis: Hep drip  Disposition: med surg  History of Present Illness:  Colin Keller is a 68 y.o. male presenting with abdominal pain.  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  Reports that he has had intermittent RUQ abdominal pain for the past few weeks but has gotten progressively worse. Reports that he had this abm pain during his 12/5 admission, but his chest pain was worse from his PE and it was potentially thought to be referred PE pain. Since discharge, he reports that the pain persisted and was interfering with his sleep. They finally decided the pain was too much and came back to ED. Reports that he was still having appetite and wanted to eat despite the abdominal pain.  Denies nausea, vomiting, diarrhea.  Denies fevers.   ED Course: VSS and afebrile on presentation. Labs notable for elevated alk phos, Cr, leukocytosis. CTAP and RUQ US  showed CBD dilation and gallbladder wall thickening. Was treated w/ 1L LR bolus, protonix , maalox, zofran , bentyl , and morphine . Gen Surgery was consulted by ED. MRCP pending.    Pertinent Past Medical History: CHF, T2 DM, CAD, H/O CABG, HLD, HTN, PVD  Remainder reviewed in history tab.   Pertinent Past Surgical History: CAD s/p CABG  Remainder reviewed in history tab.  Pertinent Social History: Lives with wife.   Pertinent Family History: Mother: heart dz, diabetes, heart failure Father: Heart dz  .   Important Outpatient Medications: As aspirin  Basaglar  20 units daily Coreg  6.25 twice daily Zyrtec Diltiazem  180 daily Eliquis  5 mg twice daily Lasix  20 mg daily Hydro 15 mg 3 times daily HCTZ 12.5 mg daily Metformin  1000  twice daily Protonix  Repatha  Tamsulosin    Objective: BP (!) 177/79   Pulse 64   Temp 97.8 F (36.6 C) (Oral)   Resp 16   SpO2 100%  Exam: Gen: alert, comfortable when laying still. NAD. HEENT: NCAT. MMM.  ResP: CTAB. Normal WOB on RA CV: RRR. No murmurs Abm: TTP in RUQ. No rebound, rigidity, or guarding. Nondistended.    Labs:  CBC BMET  Recent Labs  Lab 11/19/24 0917 11/19/24 1004  WBC 12.1*  --   HGB 8.7* 8.5*  HCT 26.4* 25.0*  PLT 261  --    Recent Labs  Lab 11/19/24 0917 11/19/24 1004  NA 134* 134*  K 3.9 3.9  CL 102  --   CO2 21*  --   BUN 40*  --   CREATININE 3.02*  --   GLUCOSE 357*  --   CALCIUM  8.4*  --        Imaging Studies Performed:  CT Abm/Pel 1. Gallbladder lumen is filled with high density material, likely vicarious excretion of intravenous contrast administered for chest CTA 11/15/2024. Gallbladder wall appears circumferentially thickened with small volume confluent edema in the right upper quadrant of the abdomen adjacent to the gallbladder. Imaging features raise the question of acute cholecystitis. Right upper quadrant ultrasound may prove helpful. 2. Intrahepatic biliary duct dilatation. Common bile duct not well demonstrated on this noncontrast exam. Correlation with liver function test recommended. 3. Soft tissue nodularity in the expected location of the left adrenal gland may reflect nodular thickening, small lymph nodes, or vascular anatomy. This is not well evaluated given lack of intravenous contrast material. 4. Possible borderline lymphadenopathy in the left para-aortic space. 5.  Aortic Atherosclerosis (ICD10-I70.0).  RUQ 1. Gallbladder wall thickening (7 mm) without focal tenderness. 2. Dilated common bile duct (13.5 mm) without intrahepatic biliary ductal dilatation.   Elicia Hamlet, MD 11/19/2024, 12:43 PM PGY-3, [facility]  FPTS Intern pager: [phone], text pages welcome Secure chat group  [facility] Teaching Service

## 2024-11-19 NOTE — ED Provider Notes (Signed)
 [facility] AT [facility] Provider Note  CSN: [id number] Arrival date & time: 11/19/24 9173  Chief Complaint(s) No chief complaint on file.  HPI Colin Keller is a 68 y.o. male here today with abdominal pain.  Patient was discharged yesterday after being admitted for PE, started on Eliquis .  Patient states the original reason he came to the emergency room on the fifth was due to abdominal pain, pain after eating.  Patient denies any chest pain or shortness of breath.   Past Medical History Past Medical History:  Diagnosis Date   Acute diastolic heart failure (HCC) 08/18/2018   AKI (acute kidney injury) 10/04/2023   Atherosclerosis of both carotid arteries 08/18/2020   Carotid dopplers 08/2019 40-59% bilateral stenosis and right subclavian stenosis   Bilateral carotid artery stenosis 08/18/2020   Right Carotid: Velocities in the right ICA are consistent with a 60-79%                 stenosis. Non-hemodynamically significant plaque <50% noted in                 the CCA. The ECA appears >50% stenosed. Proximal subclavian                 artery dilatation with elevated and turbulent flow just past the                 narrowing, measuring 1.6 cm.   Left Carotid: Velocities in the left ICA are    CHF (congestive heart failure) (HCC)    Chronic congestive heart failure (HCC) 10/04/2023   Transthoracic echocardiogram 10/08/2018: LVEF is approximately 50% with hypokinesis of the lateral (base/mid) and basal inferior walls T     Chronic left shoulder pain 03/18/2021   Chronic low back pain without sciatica 10/16/2008   H/o chronic LBP with h/o lumbar disc herniation and intermittent radicular pain  Chronic pain, on stable doses of Norco 10/325 TID. MRI in 2003 showing SMALL CENTRAL DISC HERNIATION AT L4-5.  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      CKD (chronic kidney disease), stage III (HCC)    Coronary artery disease    Coronary artery  disease involving native heart with angina pectoris, unspecified vessel or lesion type 08/10/2023   COVID 10/04/2023   Diabetes mellitus    DKA (diabetic ketoacidosis) (HCC) 10/03/2023   Dyspnea    Fall 10/04/2023   GERD (gastroesophageal reflux disease)    Hx of CABG 08/23/2018   LIMA to LAD, RIMA to RCA, SVG to OM3, EVH via right thigh   Hyperlipidemia    Hypertension    Hypertension associated with diabetes (HCC) 12/22/2008   Qualifier: Diagnosis of  By: Edrick MD, Susan     Hypertensive nephropathy 08/18/2020   Insulin  dependent type 2 diabetes mellitus (HCC) 12/22/2020   Left ventricular dysfunction 08/18/2020   TTE ([id number]): LVEF is approximately 50% with hypokinesis of the lateral (base/mid) and basal inferior walls The cavity size was normal.      Long term (current) use of antithrombotics/antiplatelets 09/21/2022   Planned 52-months DAPT for DES for PAD on 09/21/2022.  Planned 6 months therapy. End 03/23/23.   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   LIMA to LAD, RIMA to RCA, SVG to OM3, EVH via right thigh   Sleeping difficulties 09/08/2019   Subclavian artery stenosis, right 08/18/2020   Carotid dopplers 08/2019 40-59% bilateral stenosis and right subclavian stenosis   Tobacco abuse    Tobacco abuse disorder    Qualifier: Diagnosis of  By: Edrick MD, Susan     Patient Active Problem List   Diagnosis Date Noted   Chronic health problem 11/17/2024   Hypoglycemia 11/17/2024   Exertional dyspnea 11/15/2024   Acute pulmonary  embolism (HCC) 11/15/2024   Smoking greater than 20 pack years 08/22/2024   Pleural effusion due to CHF (congestive heart failure) (HCC) 10/04/2023   Statin myopathy 04/10/2023   Subclavian artery stenosis, right 02/10/2023   Normocytic anemia 09/22/2022   Severe Lumbar spinal stenosis 09/03/2022   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 07/28/2022   Paresthesia of left foot 05/13/2022   Heavy smoker (more than 20 cigarettes per day) 09/27/2021   Bilateral carotid artery disease 08/31/2021   Aneurysm of right subclavian artery 08/31/2021   Extremity atherosclerosis with intermittent claudication 08/31/2021   Encounter for long-term (current) use of insulin  (HCC) 05/25/2021   BPH (benign prostatic hyperplasia) 08/27/2020   GERD without esophagitis 08/18/2020   Healthcare maintenance 05/08/2020   PAD (peripheral artery disease) 11/23/2018   HFrEF (heart failure with reduced ejection fraction) (HCC)    Encounter for chronic pain management 02/16/2015   Diabetic retinopathy (HCC) 08/30/2013   Pulmonary nodule less than 1 cm in diameter with moderate to high risk for malignant neoplasm 07/01/2013   CKD stage G3b/A3, GFR 30-44 and albumin  creatinine ratio >300 mg/g (HCC) 03/05/2013   DM (diabetes mellitus), type 2 with peripheral vascular complications (HCC) 10/24/2012   Hypertension associated with diabetes (HCC) 12/22/2008   Hyperlipidemia associated with type 2 diabetes mellitus (HCC) 12/21/2008   Chronic low back pain without sciatica 10/16/2008   Home Medication(s) Prior to Admission medications   Medication Sig Start Date End Date Taking? Authorizing Provider  apixaban  (ELIQUIS ) 5 MG TABS tablet Take 1 tablet (5 mg total) by mouth 2 (two) times daily. To begin after completion of Eliquis  (apixaban ) VTE starter pack 12/17/24   Gherghe, Nilda HERO, MD  APIXABAN  (ELIQUIS ) VTE STARTER PACK (10MG  AND 5MG ) Take as directed on package: start with two-5mg  tablets twice daily  for 7 days. On day 8, switch to one-5mg  tablet twice daily. 11/16/24   Gherghe, Costin M, MD  aspirin  81 MG tablet Take 81 mg by mouth daily.    [provider]  carvedilol  (COREG ) 6.25 MG tablet Take 1 tablet (6.25 mg total) by mouth 2 (two) times daily with a meal. 11/17/24   Elicia Hamlet, MD  cetirizine (ZYRTEC) 10 MG chewable tablet Chew 10 mg by mouth daily.    [provider]  diltiazem  (CARDIZEM  CD) 180 MG 24 hr capsule Take 180 mg by mouth daily. 10/29/24   [provider]  Evolocumab  (REPATHA  SURECLICK) 140 MG/ML SOAJ Inject 140 mg into the skin every 14 (fourteen) days. 03/12/24  Darron Deatrice LABOR, MD  furosemide  (LASIX ) 40 MG tablet Take 0.5 tablets (20 mg total) by mouth daily. 11/18/24   Elicia Hamlet, MD  Glucagon  (BAQSIMI  TWO PACK) 3 MG/DOSE POWD Use as directed PRN low glucose 10/30/24   McDiarmid, Krystal BIRCH, MD  hydrALAZINE  (APRESOLINE ) 50 MG tablet Take 1 tablet (50 mg total) by mouth 3 (three) times daily. 08/22/24 08/17/25  McDiarmid, Krystal BIRCH, MD  hydrochlorothiazide  (HYDRODIURIL ) 12.5 MG tablet Take 1 tablet (12.5 mg total) by mouth daily. 10/30/24   McDiarmid, Krystal BIRCH, MD  HYDROcodone -acetaminophen  (NORCO) 10-325 MG tablet TAKE 1 TABLET BY MOUTH TWICE DAILY AS NEEDED Patient taking differently: Take 1 tablet by mouth in the morning and at bedtime. 11/06/24   McDiarmid, Krystal BIRCH, MD  Insulin  Glargine (BASAGLAR  KWIKPEN) 100 UNIT/ML DIAL AND INJECT 20 UNITS UNDER THE SKIN DAILY. Patient taking differently: 10-12 Units daily. 08/19/24   McDiarmid, Krystal BIRCH, MD  insulin  lispro (HUMALOG ) 100 UNIT/ML injection Inject 0.03-0.05 mLs (3-5 Units total) into the skin 3 (three) times daily with meals. 3-4 units prior to breakfast, 4-5 units prior to evening meal 08/08/24 02/04/25  McDiarmid, Krystal BIRCH, MD  metFORMIN  (GLUCOPHAGE ) 500 MG tablet Take 2 tablets (1,000 mg total) by mouth 2 (two) times daily with a meal. TAKE 2 TABLETS BY MOUTH TWICE DAILY WITH A MEAL 10/23/23 11/15/25   McDiarmid, Krystal BIRCH, MD  pantoprazole  (PROTONIX ) 40 MG tablet Take 1 tablet (40 mg total) by mouth daily. 11/18/24   Elicia Hamlet, MD  promethazine  (PHENERGAN ) 25 MG tablet Take 1 tablet (25 mg total) by mouth every 6 (six) hours as needed for nausea or vomiting. Patient taking differently: Take 12.5-25 mg by mouth every 6 (six) hours as needed for nausea or vomiting. 09/24/24   McDiarmid, Krystal BIRCH, MD  tamsulosin  (FLOMAX ) 0.4 MG CAPS capsule Take 1 capsule (0.4 mg total) by mouth daily. 02/20/24   McDiarmid, Krystal BIRCH, MD                                                                                                                                    Past Surgical History Past Surgical History:  Procedure Laterality Date   ABDOMINAL AORTOGRAM W/LOWER EXTREMITY N/A 09/21/2022   Procedure: ABDOMINAL AORTOGRAM W/LOWER EXTREMITY;  Surgeon: Darron Deatrice LABOR, MD;  Location: [facility] INVASIVE CV LAB;  Service: Cardiovascular;  Laterality: N/A;   CORONARY ARTERY BYPASS GRAFT N/A 08/23/2018   Procedure: CORONARY ARTERY BYPASS GRAFTING (CABG) x 3; -Left Internal Mammary Artery to Left Anterior Descending Artery, -Right Internal Mammary Artery to Right Coronary Artery, -Saphenous Vein Graft to Obtuse Marginal;  ENDOSCOPIC HARVEST GREATER SAPHENOUS VEIN  -Right Thigh;  Surgeon: Dusty Sudie DEL, MD;  Location: [facility] OR;  Service: Open Heart Surgery;  Laterality: N/A;   CORONARY PRESSURE/FFR STUDY N/A 08/20/2018   Procedure: INTRAVASCULAR PRESSURE WIRE/FFR STUDY;  Surgeon: Wonda Sharper, MD;  Location: [facility] INVASIVE CV LAB;  Service: Cardiovascular;  Laterality: N/A;   LEFT HEART CATH AND CORONARY ANGIOGRAPHY N/A 08/20/2018   Procedure: LEFT HEART CATH AND CORONARY ANGIOGRAPHY;  Surgeon: Wonda Sharper, MD;  Location: [facility] INVASIVE CV LAB;  Service: Cardiovascular;  Laterality: N/A;   PERIPHERAL VASCULAR ATHERECTOMY Left 09/21/2022   Procedure: PERIPHERAL VASCULAR ATHERECTOMY;  Surgeon: Darron Deatrice LABOR, MD;  Location: [facility] INVASIVE  CV LAB;  Service: Cardiovascular;  Laterality: Left;  SFA   PERIPHERAL VASCULAR BALLOON ANGIOPLASTY Left 09/21/2022   Procedure: PERIPHERAL VASCULAR BALLOON ANGIOPLASTY;  Surgeon: Darron Deatrice LABOR, MD;  Location: [facility] INVASIVE CV LAB;  Service: Cardiovascular;  Laterality: Left;  SFA   TEE WITHOUT CARDIOVERSION N/A 08/23/2018   Procedure: TRANSESOPHAGEAL ECHOCARDIOGRAM (TEE);  Surgeon: Dusty Sudie DEL, MD;  Location: [facility] OR;  Service: Open Heart Surgery;  Laterality: N/A;   Family History Family History  Problem Relation Age of Onset   Heart disease Mother    Diabetes Mother    Heart failure Mother    Heart disease Father    Sudden death Brother    Drug abuse Son     Social History Social History   Tobacco Use   Smoking status: Every Day    Current packs/day: 0.00    Average packs/day: 1 pack/day for 48.0 years (48.0 ttl pk-yrs)    Types: Cigarettes    Start date: 08/12/1970    Last attempt to quit: 08/12/2018    Years since quitting: 6.2   Smokeless tobacco: Never   Tobacco comments:    Quit post CABG in 2019 - previous 1ppd  Vaping Use   Vaping status: Never Used  Substance Use Topics   Alcohol  use: No    Alcohol /week: 0.0 standard drinks of alcohol    Drug use: No   Allergies Amlodipine , Empagliflozin , Amitriptyline , Lisinopril , Nortriptyline  hcl, Statins, and Simvastatin   Review of Systems Review of Systems  Physical Exam Vital Signs  I have reviewed the triage vital signs BP (!) 158/65   Pulse 62   Temp 97.8 F (36.6 C) (Oral)   Resp 16   SpO2 100%   Physical Exam Vitals and nursing note reviewed.  Constitutional:      Appearance: He is not toxic-appearing.  Cardiovascular:     Rate and Rhythm: Normal rate.  Pulmonary:     Effort: Pulmonary effort is normal.  Abdominal:     General: Abdomen is flat. There is no distension.     Palpations: Abdomen is soft. There is no mass.     Tenderness: There is abdominal tenderness. There is no guarding or rebound.   Neurological:     Mental Status: He is alert.     ED Results and Treatments Labs (all labs ordered are listed, but only abnormal results are displayed) Labs Reviewed  CBC - Abnormal; Notable for the following components:      Result Value   WBC 12.1 (*)    RBC 2.71 (*)    Hemoglobin 8.7 (*)    HCT 26.4 (*)    All other components within normal limits  LIPASE, BLOOD  COMPREHENSIVE METABOLIC PANEL WITH GFR  URINALYSIS, ROUTINE W REFLEX MICROSCOPIC  I-STAT VENOUS BLOOD GAS, ED  Radiology No results found.  Pertinent labs & imaging results that were available during my care of the patient were reviewed by me and considered in my medical decision making (see MDM for details).  Medications Ordered in ED Medications  lactated ringers  bolus 1,000 mL (has no administration in time range)  alum & mag hydroxide-simeth (MAALOX/MYLANTA) 200-200-20 MG/5ML suspension 15 mL (has no administration in time range)  pantoprazole  (PROTONIX ) injection 40 mg (has no administration in time range)  ondansetron  (ZOFRAN ) injection 4 mg (has no administration in time range)  dicyclomine  (BENTYL ) capsule 10 mg (has no administration in time range)                                                                                                                                     Procedures Procedures  (including critical care time)  Medical Decision Making / ED Course   This patient presents to the ED for concern of abdominal pain, this involves an extensive number of treatment options, and is a complaint that carries with it a high risk of complications and morbidity.  The differential diagnosis includes gastritis, pancreatitis, enteritis, colitis, less likely appendicitis, less likely bowel obstruction, less likely bowel perforation, peptic ulcer disease.  MDM: Patient's abdomen  is overall soft.  His symptoms do sound most consistent with peptic ulcer disease, has recently started taking Protonix .  Will obtain imaging of the patient's abdomen pelvis.  Noncontrasted imaging the patient's abdomen ordered.  Will provide him with some Protonix , Maalox and Bentyl .  Basic blood work ordered.  Reassessment 1 PM-patient's CT and ultrasound imaging are concerning for acute cholecystitis.  Patient also with enlarged common bile duct.  He will require an MRCP.  Reached out to family medicine team as patient was recently discharged and admitted to their service.  They will manage patient's anticoagulation.  Discussed with general surgery who agreed to follow along with the patient.   Additional history obtained: -Additional history obtained from wife at bedside -External records from outside source obtained and reviewed including: Chart review including previous notes, labs, imaging, consultation notes   Lab Tests: -I ordered, reviewed, and interpreted labs.   The pertinent results include:   Labs Reviewed  CBC - Abnormal; Notable for the following components:      Result Value   WBC 12.1 (*)    RBC 2.71 (*)    Hemoglobin 8.7 (*)    HCT 26.4 (*)    All other components within normal limits  LIPASE, BLOOD  COMPREHENSIVE METABOLIC PANEL WITH GFR  URINALYSIS, ROUTINE W REFLEX MICROSCOPIC  I-STAT VENOUS BLOOD GAS, ED       Imaging Studies ordered: I ordered imaging studies including order quadrant ultrasound, CT abdomen pelvis I independently visualized and interpreted imaging. I agree with the radiologist interpretation   Medicines ordered and prescription drug management: Meds ordered this encounter  Medications   lactated ringers  bolus 1,000 mL  alum & mag hydroxide-simeth (MAALOX/MYLANTA) 200-200-20 MG/5ML suspension 15 mL   pantoprazole  (PROTONIX ) injection 40 mg   ondansetron  (ZOFRAN ) injection 4 mg   dicyclomine  (BENTYL ) capsule 10 mg    -I have reviewed  the patients home medicines and have made adjustments as needed   Cardiac Monitoring: The patient was maintained on a cardiac monitor.  I personally viewed and interpreted the cardiac monitored which showed an underlying rhythm of: Normal sinus rhythm  Social Determinants of Health:  Factors impacting patients care include: Multiple medical comorbidities including anticoagulated status   Reevaluation: After the interventions noted above, I reevaluated the patient and found that they have :improved  Co morbidities that complicate the patient evaluation  Past Medical History:  Diagnosis Date   Acute diastolic heart failure (HCC) 08/18/2018   AKI (acute kidney injury) 10/04/2023   Atherosclerosis of both carotid arteries 08/18/2020   Carotid dopplers 08/2019 40-59% bilateral stenosis and right subclavian stenosis   Bilateral carotid artery stenosis 08/18/2020   Right Carotid: Velocities in the right ICA are consistent with a 60-79%                 stenosis. Non-hemodynamically significant plaque <50% noted in                 the CCA. The ECA appears >50% stenosed. Proximal subclavian                 artery dilatation with elevated and turbulent flow just past the                 narrowing, measuring 1.6 cm.   Left Carotid: Velocities in the left ICA are    CHF (congestive heart failure) (HCC)    Chronic congestive heart failure (HCC) 10/04/2023   Transthoracic echocardiogram 10/08/2018: LVEF is approximately 50% with hypokinesis of the lateral (base/mid) and basal inferior walls T     Chronic left shoulder pain 03/18/2021   Chronic low back pain without sciatica 10/16/2008   H/o chronic LBP with h/o lumbar disc herniation and intermittent radicular pain  Chronic pain, on stable doses of Norco 10/325 TID. MRI in 2003 showing SMALL CENTRAL DISC HERNIATION AT L4-5.  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      CKD (chronic kidney disease), stage III (HCC)    Coronary  artery disease    Coronary artery disease involving native heart with angina pectoris, unspecified vessel or lesion type 08/10/2023   COVID 10/04/2023   Diabetes mellitus    DKA (diabetic ketoacidosis) (HCC) 10/03/2023   Dyspnea    Fall 10/04/2023   GERD (gastroesophageal reflux disease)    Hx of CABG 08/23/2018   LIMA to LAD, RIMA to RCA, SVG to OM3, EVH via right thigh   Hyperlipidemia    Hypertension    Hypertension associated with diabetes (HCC) 12/22/2008   Qualifier: Diagnosis of  By: Edrick MD, Susan     Hypertensive nephropathy 08/18/2020   Insulin  dependent type 2 diabetes mellitus (HCC) 12/22/2020   Left ventricular dysfunction 08/18/2020   TTE ([id number]): LVEF is approximately 50% with hypokinesis of the lateral (base/mid) and basal inferior walls The cavity size was normal.      Long term (current) use of antithrombotics/antiplatelets 09/21/2022   Planned 11-months DAPT for DES for PAD on 09/21/2022.  Planned 6 months therapy. End 03/23/23.   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

## 2024-11-19 NOTE — Progress Notes (Signed)
 Notified Dr. Elicia of pt strict NPO  and ordered to get  coreg  PO. MD ordered to hold Coreg  at this time. Sherline Jubilee

## 2024-11-19 NOTE — Inpatient Diabetes Management (Addendum)
 Inpatient Diabetes Program Recommendations  AACE/ADA: New Consensus Statement on Inpatient Glycemic Control (2015)  Target Ranges:  Prepandial:   less than 140 mg/dL      Peak postprandial:   less than 180 mg/dL (1-2 hours)      Critically ill patients:  140 - 180 mg/dL   Lab Results  Component Value Date   GLUCAP 177 (H) 11/17/2024   HGBA1C 9.6 (H) 11/15/2024    Review of Glycemic Control  Diabetes history: DM2 Outpatient Diabetes medications:  Basaglar  10 units QAM Humalog  3-5 units TID  Freestyle Libre 3 Plus Current orders for Inpatient glycemic control:  None  Inpatient Diabetes Program Recommendations:    Semglee  5 units every day Novolog  0-6 units TID   Spoke with patient and spouse at bedside.  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   Had hypoglycemia over the weekend when he was here after receiving Semglee  10 units BID.  Will follow while inpatient.  He is currently wearing a [employer].  Provided him with a sample Libre for after MRI.    Thank you, Wyvonna Pinal, MSN, CDCES Diabetes Coordinator Inpatient Diabetes Program [phone] (team pager from 8a-5p)

## 2024-11-19 NOTE — Assessment & Plan Note (Addendum)
 Recently discharged on eliquis . Resp status stable today. - Start heparin  drip per pharm for PE - Hold eliquis  for potential procedure

## 2024-11-19 NOTE — Hospital Course (Addendum)
 Colin Keller is a 68 y.o.male with a history of recent PE, CHF, T2DM, CAD s/p CABG, HLD, HTN, PVD who was admitted to the family medicine teaching Service at [facility] for acute symptomatic biliary stricture. His hospital course is detailed below:  Patient initially admitted for work up of abdominal pain, but during admission continued to decompensate with the problems listed below.  Complicated hospital course with many interventions and medication adjustments.  The following hospital summary includes the final medication adjustments made.   Acute cholecystitis  Pancreatic Adenocarcinoma  Biliary stricture Patient recently discharged for small PE.  Returned with intermittent RUQ abdominal pain progressively worsening over the last few days, was found to have CBD dilation and gallbladder wall thickening on CTAP and RUQ US .  Lab work with elevated LFTs, alk phos and bilirubin.  MRCP found diffuse biliary ductal dilatation due to distal CBD stricture and multiple tiny cystic foci within the pancreatic head at the site of distal common bile duct stricture.  Gen surg was consulted and recommended HIDA scan which confirmed CBD stricture and thickened gallbladder wall concerning for acute cholecystitis with intrahepatic biliary duct dilatation.  GI was consulted for management of cholecystitis along with concern for pancreatic mass.  Given concern for intrabdominal infection, patient was treated with ceftriaxone  (12/14 - 12/19) and Flagyl  (12/14 - 12/18).  Patient underwent ERCP with biliary sphincterotomy and CBD stent placement on 12/12.  Postprocedure patient had bradycardia vs junctional rhythm in the 40s requiring atropine  and was admitted to ICU overnight for monitoring.  T. bili and LFTs gradually improved after biliary stent and cholecystostomy tube.SABRA  Percutaneous cholecystotomy tube had good output and remained at discharged with plan to follow-up with IR outpatient***.  EUS 12/18 showing concern for  pancreatic adenocarcinoma, core needle biopsies confirmed the same.  Oncology was consulted and discussed chemotherapy with family, Port-A-Cath was placed while admitted with plans of starting chemotherapy 12/17/24.  Atrial Fibrillation Patient went into new Afib w/ RVR on 12/17, found incidentally as patient was asymptomatic.  Trop and EKG reassuring.  Cardiology was consulted and he was treated with IV rate control and transitioned to oral Coreg  12.5 mg BID and Amiodarone  200 mg BID for 1 week followed by 200 mg daily thereafter ***.   PE Patient with recent PE and was on Eliquis  5 mg BID outpatient.  He was treated with IV heparin  inpatient which was held intermittently for procedures and acute blood loss and transitioned back to Eliquis  5 mg BID prior to discharge, with hemoglobin stable at ***.   Anemia  Thought to be chronic anemia due to renal disease and some component of GI blood loss.  Patient's hemoglobin was monitored throughout this extended hospital stay.  He had clipping of one bleeding melanotic spot of duodenum next to the sphincterotomy site during initial EUS.  Patient received 2 units of pRBC while admitted, and hemoglobin stable at discharge***.  AKI  Given patients ongoing AKI and nephrotic range proteinuria, nephrology was consulted.  Renal ultrasound was unremarkable.  Autoimmune workup was unremarkable***.  Home lasix  and hydrochlorothiazide  were held.  Nephrology recommended renal biopsy, however patient and family agreed to forego in light of the new pancreatic cancer diagnosis, nephrology will follow up outpatient.  Elevated stable creatinine at discharge***.   Other chronic conditions were medically managed with home medications and formulary alternatives as necessary (T2DM, CHF, CAD, HLD, HTN)  PCP Follow-up Recommendations: Ensure appropriate IR follow up for tube (see 12/16 note, needs daily flushes etc)  Follow-up on the ERCP stent exchange in 3 to 4 months if not a  surgical candidate  Outpatient PET scan to assess metabolic activity of lung nodules and evaluate for metastatic disease per oncology. Chemotherapy per oncology with follow up imaging as indicated.

## 2024-11-19 NOTE — ED Triage Notes (Signed)
 Pt presents with abd pain.  Just DC yesterday from admission from yesterday for PE.  Today he has had right side abd pain for several days.  It is 10/10 right now.  They feel it was not addressed while being tx for PE because it has not resolved.  Pt has also had some unstable glucose readings during previous admission.

## 2024-11-20 ENCOUNTER — Encounter: Payer: Self-pay | Admitting: Family Medicine

## 2024-11-20 ENCOUNTER — Inpatient Hospital Stay (HOSPITAL_COMMUNITY)

## 2024-11-20 DIAGNOSIS — K819 Cholecystitis, unspecified: Principal | ICD-10-CM | POA: Insufficient documentation

## 2024-11-20 DIAGNOSIS — K810 Acute cholecystitis: Secondary | ICD-10-CM

## 2024-11-20 DIAGNOSIS — I2699 Other pulmonary embolism without acute cor pulmonale: Secondary | ICD-10-CM

## 2024-11-20 DIAGNOSIS — R1011 Right upper quadrant pain: Secondary | ICD-10-CM | POA: Diagnosis not present

## 2024-11-20 DIAGNOSIS — K869 Disease of pancreas, unspecified: Secondary | ICD-10-CM

## 2024-11-20 LAB — GLUCOSE, CAPILLARY
Glucose-Capillary: 86 mg/dL (ref 70–99)
Glucose-Capillary: 111 mg/dL — ABNORMAL HIGH (ref 70–99)
Glucose-Capillary: 162 mg/dL — ABNORMAL HIGH (ref 70–99)
Glucose-Capillary: 218 mg/dL — ABNORMAL HIGH (ref 70–99)
Glucose-Capillary: 197 mg/dL — ABNORMAL HIGH (ref 70–99)
Glucose-Capillary: 176 mg/dL — ABNORMAL HIGH (ref 70–99)
Glucose-Capillary: 216 mg/dL — ABNORMAL HIGH (ref 70–99)

## 2024-11-20 LAB — CBC
HCT: 25 % — ABNORMAL LOW (ref 39.0–52.0)
Hemoglobin: 8.4 g/dL — ABNORMAL LOW (ref 13.0–17.0)
MCH: 32.6 pg (ref 26.0–34.0)
MCHC: 33.6 g/dL (ref 30.0–36.0)
MCV: 96.9 fL (ref 80.0–100.0)
Platelets: 212 K/uL (ref 150–400)
RBC: 2.58 MIL/uL — ABNORMAL LOW (ref 4.22–5.81)
RDW: 13 % (ref 11.5–15.5)
WBC: 12 K/uL — ABNORMAL HIGH (ref 4.0–10.5)
nRBC: 0 % (ref 0.0–0.2)

## 2024-11-20 LAB — COMPREHENSIVE METABOLIC PANEL WITH GFR
ALT: 85 U/L — ABNORMAL HIGH (ref 0–44)
AST: 137 U/L — ABNORMAL HIGH (ref 15–41)
Albumin: 2.1 g/dL — ABNORMAL LOW (ref 3.5–5.0)
Alkaline Phosphatase: 320 U/L — ABNORMAL HIGH (ref 38–126)
Anion gap: 12 (ref 5–15)
BUN: 33 mg/dL — ABNORMAL HIGH (ref 8–23)
CO2: 21 mmol/L — ABNORMAL LOW (ref 22–32)
Calcium: 8.2 mg/dL — ABNORMAL LOW (ref 8.9–10.3)
Chloride: 105 mmol/L (ref 98–111)
Creatinine, Ser: 2.8 mg/dL — ABNORMAL HIGH (ref 0.61–1.24)
GFR, Estimated: 24 mL/min — ABNORMAL LOW (ref >60)
Glucose, Bld: 173 mg/dL — ABNORMAL HIGH (ref 70–99)
Potassium: 4.1 mmol/L (ref 3.5–5.1)
Sodium: 138 mmol/L (ref 135–145)
Total Bilirubin: 3 mg/dL — ABNORMAL HIGH (ref 0.0–1.2)
Total Protein: 5.6 g/dL — ABNORMAL LOW (ref 6.5–8.1)

## 2024-11-20 LAB — APTT
aPTT: 102 s — ABNORMAL HIGH (ref 24–36)
aPTT: 53 s — ABNORMAL HIGH (ref 24–36)

## 2024-11-20 LAB — MAGNESIUM: Magnesium: 1.7 mg/dL (ref 1.7–2.4)

## 2024-11-20 LAB — HEPARIN LEVEL (UNFRACTIONATED): Heparin Unfractionated: >1.1 [IU]/mL — ABNORMAL HIGH (ref 0.30–0.70)

## 2024-11-20 MED ORDER — ACETAMINOPHEN 10 MG/ML IV SOLN
1000.0000 mg | Freq: Four times a day (QID) | INTRAVENOUS | Status: DC
  Filled 2024-11-20: qty 100

## 2024-11-20 MED ORDER — HYDROMORPHONE HCL 1 MG/ML IJ SOLN
1.0000 mg | INTRAMUSCULAR | Status: DC | PRN
  Administered 2024-11-20 – 2024-11-22 (×6): 1 mg via INTRAVENOUS
  Filled 2024-11-20 (×6): qty 1

## 2024-11-20 MED ORDER — MORPHINE SULFATE (PF) 4 MG/ML IV SOLN: 3.0000 mg | Freq: Once | INTRAVENOUS | Status: DC | PRN

## 2024-11-20 MED ORDER — ACETAMINOPHEN 500 MG PO TABS
1000.0000 mg | ORAL_TABLET | Freq: Three times a day (TID) | ORAL | Status: DC
  Administered 2024-11-20 – 2024-11-21 (×3): 1000 mg via ORAL
  Filled 2024-11-20 (×4): qty 2

## 2024-11-20 MED ORDER — OXYCODONE HCL 5 MG PO TABS
10.0000 mg | ORAL_TABLET | Freq: Four times a day (QID) | ORAL | Status: DC
  Administered 2024-11-20 – 2024-11-21 (×6): 10 mg via ORAL
  Filled 2024-11-20 (×6): qty 2

## 2024-11-20 MED ORDER — ALUM & MAG HYDROXIDE-SIMETH 200-200-20 MG/5ML PO SUSP
15.0000 mL | Freq: Four times a day (QID) | ORAL | Status: DC | PRN
  Administered 2024-11-20 – 2024-12-03 (×4): 15 mL via ORAL
  Filled 2024-11-20 (×6): qty 30

## 2024-11-20 MED ORDER — TECHNETIUM TC 99M MEBROFENIN IV KIT
7.5000 | PACK | Freq: Once | INTRAVENOUS | Status: AC
  Administered 2024-11-20: 7.5 via INTRAVENOUS

## 2024-11-20 MED ORDER — INSULIN GLARGINE 100 UNIT/ML ~~LOC~~ SOLN
5.0000 [IU] | Freq: Every day | SUBCUTANEOUS | Status: DC
  Administered 2024-11-20 – 2024-11-21 (×2): 5 [IU] via SUBCUTANEOUS
  Filled 2024-11-20 (×3): qty 0.05

## 2024-11-20 MED ORDER — ONDANSETRON HCL 4 MG/2ML IJ SOLN
4.0000 mg | Freq: Four times a day (QID) | INTRAMUSCULAR | Status: DC | PRN
  Administered 2024-11-29: 4 mg via INTRAVENOUS
  Filled 2024-11-20 (×2): qty 2

## 2024-11-20 MED ORDER — MORPHINE SULFATE (PF) 4 MG/ML IV SOLN: 3.0000 mg | Freq: Once | INTRAVENOUS | Status: DC

## 2024-11-20 NOTE — Progress Notes (Signed)
 Called for consult. Chart reviewed. Patient has been in nuclear medicine getting HIDA. I spoke at length with patient's daughter, son-in-law and wife in the room about CT / MR findings.   It sounds like from what the wife tells me about her conversation with surgery that patient would not be a candidate for cholecystectomy but would need a percutaneous drain if has acute cholecystitis.   Etiology of biliary duct dilation unclear at this point..  Patient will most likely require ERCP for further evaluation and also decompression.  I did discuss this probable scenario with the family.   Will await HIDA and follow-up backup with the patient in the a.m.  Of note, patient has not had Eliquis  since the morning of 11/19/2024.  He is currently on IV heparin  for recently diagnosed PE

## 2024-11-20 NOTE — Consult Note (Addendum)
 NOTE:   Consultation note started on 11/20/24 but consult was not done until today - 11/21/24 as patient was out of the room getting HIDA scan .                                                  Consultation Note   Referring Provider:   Family Medicine Teaching Service PCP: McDiarmid, Krystal BIRCH, MD Primary Gastroenterologist: Sampson       Reason for Consultation:  Hepatobiliary and pancreas abnormalities on imaging, elevated LFTs, abdominal pain  DOA: 11/19/2024         Hospital Day: 3   ASSESSMENT    68 year old male with multiple medical problems admitted with   Abdominal pain Elevated LFTs Abnormal imaging concerning for acute cholecystitis,  intrahepatic / extrahepatic biliary duct dilation with  concern for distal CBD stricture HIDA scan done yesterday but the gallbladder could not be evaluated because the common bile duct did not fill.  Diffuse dilation of CBD concerning for malignant stricture (  CEA normal, CA 19-9 elevated at 113.   Leukocytosis, progressive Secondary to biliary obstruction /cholangitis ? Afebrile.   Pancreatic cysts MRI -limited study due to lack of IV contrast but showing multiple tiny cystic foci within the pancreatic head at the site of distal common bile duct stricture, possibly due to chronic pancreatitis, however, pancreatic mass cannot definitely be excluded  Pulmonary embolism, recently diagnosed Takes Eliquis   Chronic normocytic anemia, ? 2/2 to CKD Hemoglobin 8.9, baseline hemoglobin 9-10  Type 2 diabetes, uncontrolled  Hypertension, resistant  PAD  CAD/history of CABG   Chronic diastolic CHF  CKD3b  See PMH for any additional medical history  / medical problems  Principal Problem:   Abdominal pain Active Problems:   AKI (acute kidney injury)   Acute pulmonary embolism (HCC)   Chronic health problem   Cholecystitis, acute   Pancreatic lesion    PLAN:   --Patient needs ERCP for evaluation of CBD obstruction and  also biliary decompression.  This will be most likely done tomorrow. The benefits and risks of ERCP with possible sphincterotomy and stent placement not limited to cardiopulmonary complications of sedation, bleeding, infection, perforation,and pancreatitis were discussed with the patient who agrees to proceed.  ERCP was also extensively discussed with patient's daughter, son-in-law and wife --Will hold IV heparin  6 hours prior to ERCP -- Continue Zosyn    HPI   Brief History:   Colin Keller is a 68 year old male with multiple medical problems.  He was hospitalized earlier this month after presenting to the hospital with exertional dyspnea and chest pain.  On arrival he was hypertensive slightly volume overloaded but ultimately found to have a pulmonary embolism .  Treated with heparin  , eventually transitioned to Eliquis  and discharged home 11/17/2024                                                Interval History:  Patient returned to the ED yesterday with complaints of abdominal pain .  Found to have new elevation in LFTs and abnormal hepatobiliary imaging.  Admitted for further evaluation.  The pain he describes is actually the same pain he was having during recent admission when diagnosed with  a PE.    Admitting labs /imaging notable for glucose of 357 Creatinine 3.02 (up from 2.46) Alkaline phosphatase 274 AST 51 ALT 35 Total bilirubin 1.1 Wbc 12 Hemoglobin 8.7  RUQ ultrasound IMPRESSION: 1. Gallbladder wall thickening (7 mm) without focal tenderness. 2. Dilated common bile duct (13.5 mm) without intrahepatic biliary ductal dilatation.  Noncontrast CTAP IMPRESSION: 1. Gallbladder lumen is filled with high density material, likely vicarious excretion of intravenous contrast administered for chest CTA 11/15/2024. Gallbladder wall appears circumferentially thickened with small volume confluent edema in the right upper quadrant of the abdomen adjacent to the gallbladder. Imaging features  raise the question of acute cholecystitis. Right upper quadrant ultrasound may prove helpful. 2. Intrahepatic biliary duct dilatation. Common bile duct not well demonstrated on this noncontrast exam. Correlation with liver function test recommended. 3. Soft tissue nodularity in the expected location of the left adrenal gland may reflect nodular thickening, small lymph nodes, or vascular anatomy. This is not well evaluated given lack of intravenous contrast material. 4. Possible borderline lymphadenopathy in the left para-aortic space. 5.  Aortic Atherosclerosis (ICD10-I70.0)  MR abdomen MRCP wo contrast *Study limted by lack of IV contrast and movement IMPRESSION: Diffuse biliary ductal dilatation due to distal common bile duct stricture. No evidence of choledocholithiasis. Multiple tiny cystic foci within the pancreatic head at the site of distal common bile duct stricture. This may be due to chronic pancreatitis, however, pancreatic mass cannot definitely be excluded on this unenhanced exam. Recommend further imaging evaluation with pancreatic protocol abdomen CT without and with contrast Distended gallbladder with mild diffuse wall thickening, which is nonspecific. Differential diagnosis includes cholecystitis and other etiologies such as hypoalbuminemia, liver disease, or heart failure.  Anasarca and minimal perihepatic ascites.   Labs and Imaging:  Recent Labs    11/19/24 0917 11/20/24 0448 11/21/24 0424  PROT 6.1* 5.6* 5.8*  ALBUMIN  2.5* 2.1* 2.0*  AST 51* 137* 289*  ALT 35 85* 187*  ALKPHOS 274* 320* 540*  BILITOT 1.1 3.0* 3.9*   Recent Labs    11/19/24 0917 11/19/24 1004 11/20/24 0448 11/21/24 0424  WBC 12.1*  --  12.0* 17.9*  HGB 8.7* 8.5* 8.4* 8.9*  HCT 26.4* 25.0* 25.0* 25.6*  MCV 97.4  --  96.9 95.2  PLT 261  --  212 244   Recent Labs    11/19/24 0917 11/19/24 1004 11/20/24 0448 11/21/24 0424  NA 134* 134* 138 134*  K 3.9 3.9 4.1 3.7  CL 102  --  105 101   CO2 21*  --  21* 23  GLUCOSE 357*  --  173* 200*  BUN 40*  --  33* 34*  CREATININE 3.02*  --  2.80* 2.51*  CALCIUM  8.4*  --  8.2* 8.0*    Past Medical History:  Diagnosis Date   Acute diastolic heart failure (HCC) 08/18/2018   AKI (acute kidney injury) 10/04/2023   Atherosclerosis of both carotid arteries 08/18/2020   Carotid dopplers 08/2019 40-59% bilateral stenosis and right subclavian stenosis   Bilateral carotid artery stenosis 08/18/2020   Right Carotid: Velocities in the right ICA are consistent with a 60-79%                 stenosis. Non-hemodynamically significant plaque <50% noted in                 the CCA. The ECA appears >50% stenosed. Proximal subclavian  artery dilatation with elevated and turbulent flow just past the                 narrowing, measuring 1.6 cm.   Left Carotid: Velocities in the left ICA are    CHF (congestive heart failure) (HCC)    Chronic congestive heart failure (HCC) 10/04/2023   Transthoracic echocardiogram 10/08/2018: LVEF is approximately 50% with hypokinesis of the lateral (base/mid) and basal inferior walls T     Chronic left shoulder pain 03/18/2021   Chronic low back pain without sciatica 10/16/2008   H/o chronic LBP with h/o lumbar disc herniation and intermittent radicular pain  Chronic pain, on stable doses of Norco 10/325 TID. MRI in 2003 showing SMALL CENTRAL DISC HERNIATION AT L4-5.  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      CKD (chronic kidney disease), stage III (HCC)    Coronary artery disease    Coronary artery disease involving native heart with angina pectoris, unspecified vessel or lesion type 08/10/2023   COVID 10/04/2023   Diabetes mellitus    DKA (diabetic ketoacidosis) (HCC) 10/03/2023   Dyspnea    Fall 10/04/2023   GERD (gastroesophageal reflux disease)    Hx of CABG 08/23/2018   LIMA to LAD, RIMA to RCA, SVG to OM3, EVH via right thigh   Hyperlipidemia    Hypertension     Hypertension associated with diabetes (HCC) 12/22/2008   Qualifier: Diagnosis of  By: Edrick MD, Susan     Hypertensive nephropathy 08/18/2020   Insulin  dependent type 2 diabetes mellitus (HCC) 12/22/2020   Left ventricular dysfunction 08/18/2020   TTE ([id number]): LVEF is approximately 50% with hypokinesis of the lateral (base/mid) and basal inferior walls The cavity size was normal.      Long term (current) use of antithrombotics/antiplatelets 09/21/2022   Planned 48-months DAPT for DES for PAD on 09/21/2022.  Planned 6 months therapy. End 03/23/23.   Peripheral artery disease 08/31/2021   [facility] Cardiology Vascular Study lower extremities 08/30/21:            Today's ABI  Today's TBI   Previous ABIPrevious TBI  +-------+-----------+-----------+------------+------------+  Right  0.76       0.31       1.03                      +-------+-----------+-----------+------------+------------+  Left   0.63       0.20       0.95                      +-------+-----------+---------   Proteinuria 08/18/2020   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   LIMA to LAD, RIMA to RCA, SVG to OM3, EVH via right thigh   Sleeping difficulties 09/08/2019   Smoking greater than 20 pack years 08/22/2024   Subclavian artery stenosis, right 08/18/2020   Carotid dopplers 08/2019 40-59% bilateral stenosis and right subclavian stenosis   Tobacco abuse    Tobacco abuse disorder    Qualifier: Diagnosis of  By: Edrick MD, Devere      Past Surgical History:  Procedure Laterality Date   ABDOMINAL AORTOGRAM W/LOWER EXTREMITY N/A 09/21/2022   Procedure: ABDOMINAL AORTOGRAM W/LOWER EXTREMITY;  Surgeon: Darron Deatrice LABOR, MD;  Location: [facility] INVASIVE CV LAB;  Service: Cardiovascular;  Laterality: N/A;   CORONARY ARTERY BYPASS GRAFT N/A 08/23/2018   Procedure: CORONARY ARTERY BYPASS GRAFTING (CABG) x 3; -Left Internal Mammary Artery to Left Anterior  Descending Artery, -Right Internal Mammary Artery to Right Coronary Artery,  -Saphenous Vein Graft to Obtuse Marginal;  ENDOSCOPIC HARVEST GREATER SAPHENOUS VEIN  -Right Thigh;  Surgeon: Dusty Sudie DEL, MD;  Location: [facility];  Service: Open Heart Surgery;  Laterality: N/A;   CORONARY PRESSURE/FFR STUDY N/A 08/20/2018   Procedure: INTRAVASCULAR PRESSURE WIRE/FFR STUDY;  Surgeon: Wonda Sharper, MD;  Location: [facility] INVASIVE CV LAB;  Service: Cardiovascular;  Laterality: N/A;   LEFT HEART CATH AND CORONARY ANGIOGRAPHY N/A 08/20/2018   Procedure: LEFT HEART CATH AND CORONARY ANGIOGRAPHY;  Surgeon: Wonda Sharper, MD;  Location: [facility] INVASIVE CV LAB;  Service: Cardiovascular;  Laterality: N/A;   PERIPHERAL VASCULAR ATHERECTOMY Left 09/21/2022   Procedure: PERIPHERAL VASCULAR ATHERECTOMY;  Surgeon: Darron Deatrice LABOR, MD;  Location: [facility] INVASIVE CV LAB;  Service: Cardiovascular;  Laterality: Left;  SFA   PERIPHERAL VASCULAR BALLOON ANGIOPLASTY Left 09/21/2022   Procedure: PERIPHERAL VASCULAR BALLOON ANGIOPLASTY;  Surgeon: Darron Deatrice LABOR, MD;  Location: [facility] INVASIVE CV LAB;  Service: Cardiovascular;  Laterality: Left;  SFA   TEE WITHOUT CARDIOVERSION N/A 08/23/2018   Procedure: TRANSESOPHAGEAL ECHOCARDIOGRAM (TEE);  Surgeon: Dusty Sudie DEL, MD;  Location: [facility] OR;  Service: Open Heart Surgery;  Laterality: N/A;    Family History  Problem Relation Age of Onset   Heart disease Mother    Diabetes Mother    Heart failure Mother    Heart disease Father    Sudden death Brother    Drug abuse Son     Prior to Admission medications   Medication Sig Start Date End Date Taking? Authorizing Provider  APIXABAN  (ELIQUIS ) VTE STARTER PACK (10MG  AND 5MG ) Take as directed on package: start with two-5mg  tablets twice daily for 7 days. On day 8, switch to one-5mg  tablet twice daily. 11/16/24  Yes Gherghe, Costin M, MD  aspirin  81 MG tablet Take 81 mg by mouth daily.   Yes [provider]  carvedilol  (COREG ) 6.25 MG tablet Take 1 tablet (6.25 mg total) by mouth 2 (two) times daily  with a meal. 11/17/24  Yes Elicia Hamlet, MD  cetirizine (ZYRTEC) 10 MG chewable tablet Chew 10 mg by mouth as needed for allergies.   Yes [provider]  diltiazem  (CARDIZEM  CD) 180 MG 24 hr capsule Take 180 mg by mouth daily. 10/29/24  Yes [provider]  Evolocumab  (REPATHA  SURECLICK) 140 MG/ML SOAJ Inject 140 mg into the skin every 14 (fourteen) days. 03/12/24  Yes Darron Deatrice LABOR, MD  furosemide  (LASIX ) 40 MG tablet Take 0.5 tablets (20 mg total) by mouth daily. 11/18/24  Yes Elicia Hamlet, MD  Glucagon  (BAQSIMI  TWO PACK) 3 MG/DOSE POWD Use as directed PRN low glucose 10/30/24  Yes McDiarmid, Krystal BIRCH, MD  hydrALAZINE  (APRESOLINE ) 50 MG tablet Take 1 tablet (50 mg total) by mouth 3 (three) times daily. 08/22/24 08/17/25 Yes McDiarmid, Krystal BIRCH, MD  hydrochlorothiazide  (HYDRODIURIL ) 12.5 MG tablet Take 1 tablet (12.5 mg total) by mouth daily. 10/30/24  Yes McDiarmid, Krystal BIRCH, MD  HYDROcodone -acetaminophen  (NORCO) 10-325 MG tablet TAKE 1 TABLET BY MOUTH TWICE DAILY AS NEEDED Patient taking differently: Take 1 tablet by mouth in the morning and at bedtime. 11/06/24  Yes McDiarmid, Krystal BIRCH, MD  Insulin  Glargine (BASAGLAR  KWIKPEN) 100 UNIT/ML DIAL AND INJECT 20 UNITS UNDER THE SKIN DAILY. Patient taking differently: Inject 10-12 Units into the skin daily. 08/19/24  Yes McDiarmid, Krystal BIRCH, MD  insulin  lispro (HUMALOG ) 100 UNIT/ML injection Inject 0.03-0.05 mLs (3-5 Units total) into the skin 3 (  three) times daily with meals. 3-4 units prior to breakfast, 4-5 units prior to evening meal 08/08/24 02/04/25 Yes McDiarmid, Krystal BIRCH, MD  metFORMIN  (GLUCOPHAGE ) 500 MG tablet Take 2 tablets (1,000 mg total) by mouth 2 (two) times daily with a meal. TAKE 2 TABLETS BY MOUTH TWICE DAILY WITH A MEAL 10/23/23 11/15/25 Yes McDiarmid, Krystal BIRCH, MD  pantoprazole  (PROTONIX ) 40 MG tablet Take 1 tablet (40 mg total) by mouth daily. 11/18/24  Yes Elicia Hamlet, MD  promethazine  (PHENERGAN ) 25 MG tablet Take 1 tablet (25 mg  total) by mouth every 6 (six) hours as needed for nausea or vomiting. Patient taking differently: Take 12.5-25 mg by mouth every 6 (six) hours as needed for nausea or vomiting. 09/24/24  Yes McDiarmid, Krystal BIRCH, MD  tamsulosin  (FLOMAX ) 0.4 MG CAPS capsule Take 1 capsule (0.4 mg total) by mouth daily. 02/20/24  Yes McDiarmid, Krystal BIRCH, MD  apixaban  (ELIQUIS ) 5 MG TABS tablet Take 1 tablet (5 mg total) by mouth 2 (two) times daily. To begin after completion of Eliquis  (apixaban ) VTE starter pack Patient not taking: Reported on 11/19/2024 12/17/24   Trixie Nilda HERO, MD    Current Facility-Administered Medications  Medication Dose Route Frequency Provider Last Rate Last Admin   acetaminophen  (TYLENOL ) tablet 1,000 mg  1,000 mg Oral Q8H Baloch, Mahnoor, MD   1,000 mg at 11/21/24 0505   alum & mag hydroxide-simeth (MAALOX/MYLANTA) 200-200-20 MG/5ML suspension 15 mL  15 mL Oral Q6H PRN Alena Morrison, Reagan, MD   15 mL at 11/20/24 2219   aspirin  EC tablet 81 mg  81 mg Oral Daily Zheng, Jacky, MD   81 mg at 11/21/24 9074   carvedilol  (COREG ) tablet 6.25 mg  6.25 mg Oral BID WC Zheng, Jacky, MD   6.25 mg at 11/21/24 9075   diltiazem  (CARDIZEM  CD) 24 hr capsule 180 mg  180 mg Oral Daily Zheng, Jacky, MD   180 mg at 11/21/24 0925   heparin  ADULT infusion 100 units/mL (25000 units/250mL)  1,200 Units/hr Intravenous Continuous Hershal Vito MATSU, RPH 12 mL/hr at 11/21/24 0524 1,200 Units/hr at 11/21/24 0524   HYDROmorphone  (DILAUDID ) injection 1 mg  1 mg Intravenous Q4H PRN Baloch, Mahnoor, MD   1 mg at 11/21/24 0053   insulin  aspart (novoLOG ) injection 0-6 Units  0-6 Units Subcutaneous TID WC Zheng, Jacky, MD   2 Units at 11/21/24 9073   insulin  glargine (LANTUS ) injection 5 Units  5 Units Subcutaneous Daily Nygaard, Joseph, MD   5 Units at 11/21/24 0925   lidocaine  (LIDODERM ) 5 % 3 patch  3 patch Transdermal Q24H Alena Morrison, Reagan, MD   3 patch at 11/20/24 1948   ondansetron  (ZOFRAN ) injection 4 mg  4 mg  Intravenous Q6H PRN Baloch, Mahnoor, MD       oxyCODONE  (Oxy IR/ROXICODONE ) immediate release tablet 10 mg  10 mg Oral Q6H Baloch, Mahnoor, MD   10 mg at 11/21/24 0505   pantoprazole  (PROTONIX ) EC tablet 40 mg  40 mg Oral Daily Zheng, Jacky, MD   40 mg at 11/21/24 9074   piperacillin -tazobactam (ZOSYN ) IVPB 3.375 g  3.375 g Intravenous Q8H Millen, Jessica B, RPH 12.5 mL/hr at 11/21/24 0931 3.375 g at 11/21/24 0931   polyethylene glycol (MIRALAX / GLYCOLAX) packet 17 g  17 g Oral Daily Zheng, Jacky, MD   17 g at 11/21/24 9075   senna (SENOKOT) tablet 8.6 mg  1 tablet Oral Daily Elicia Hamlet, MD   8.6 mg at 11/21/24 [phone]  tamsulosin  (FLOMAX ) capsule 0.4 mg  0.4 mg Oral Daily Elicia Hamlet, MD   0.4 mg at 11/21/24 9075    Allergies as of 11/19/2024 - Review Complete 11/19/2024  Allergen Reaction Noted   Amlodipine  Other (See Comments) 08/28/2018   Empagliflozin  Other (See Comments) 12/01/2023   Amitriptyline  Other (See Comments) 02/24/2017   Lisinopril  Other (See Comments) 02/21/2024   Nortriptyline  hcl Other (See Comments) 03/29/2018   Statins  09/14/2022   Simvastatin  Other (See Comments) 08/23/2013    Social History   Socioeconomic History   Marital status: Married    Spouse name: Katheryn    Number of children: 2   Years of education: 10   Highest education level: 10th grade  Occupational History   Occupation: music therapist  Tobacco Use   Smoking status: Every Day    Current packs/day: 0.00    Average packs/day: 1 pack/day for 48.0 years (48.0 ttl pk-yrs)    Types: Cigarettes    Start date: 08/12/1970    Last attempt to quit: 08/12/2018    Years since quitting: 6.2   Smokeless tobacco: Never   Tobacco comments:    Quit post CABG in 2019 - previous 1ppd  Vaping Use   Vaping status: Never Used  Substance and Sexual Activity   Alcohol use: No    Alcohol/week: 0.0 standard drinks of alcohol   Drug use: No   Sexual activity: Yes    Birth control/protection: Post-menopausal  Other  Topics Concern   Not on file  Social History Narrative   Lives with his wife in [place].    Wife Katheryn is also [facility] patient.   Works odd-jobs in holiday representative.    Previously used marijuana -quit 10/03. Previous 1ppd smoker- quit 2019.   Surrogate decision maker: Colin Keller (wife)   Patient has one daughter.   Patients son died of a drug overdose 14-Mar-2021.   Patient has 2 dogs and 2 cats.       Social Drivers of Health   Tobacco Use: High Risk (11/19/2024)   Patient History    Smoking Tobacco Use: Every Day    Smokeless Tobacco Use: Never    Passive Exposure: Not on file  Financial Resource Strain: Low Risk (08/07/2023)   Overall Financial Resource Strain (CARDIA)    Difficulty of Paying Living Expenses: Not hard at all  Food Insecurity: No Food Insecurity (11/19/2024)   Epic    Worried About Programme Researcher, Broadcasting/film/video in the Last Year: Never true    Ran Out of Food in the Last Year: Never true  Transportation Needs: No Transportation Needs (11/19/2024)   Epic    Lack of Transportation (Medical): No    Lack of Transportation (Non-Medical): No  Physical Activity: Sufficiently Active (08/07/2023)   Exercise Vital Sign    Days of Exercise per Week: 5 days    Minutes of Exercise per Session: 30 min  Stress: No Stress Concern Present (08/07/2023)   [employer] of Occupational Health - Occupational Stress Questionnaire    Feeling of Stress : Not at all  Social Connections: Moderately Isolated (11/19/2024)   Social Connection and Isolation Panel    Frequency of Communication with Friends and Family: More than three times a week    Frequency of Social Gatherings with Friends and Family: Three times a week    Attends Religious Services: Never    Active Member of Clubs or Organizations: No    Attends Banker Meetings: Never    Marital Status: Married  Intimate Partner Violence: Not At Risk (11/19/2024)   Epic    Fear of Current or Ex-Partner: No    Emotionally Abused: No     Physically Abused: No    Sexually Abused: No  Depression (PHQ2-9): Low Risk (08/22/2024)   Depression (PHQ2-9)    PHQ-2 Score: 0  Alcohol Screen: Low Risk (08/07/2023)   Alcohol Screen    Last Alcohol Screening Score (AUDIT): 0  Housing: Low Risk (11/19/2024)   Epic    Unable to Pay for Housing in the Last Year: No    Number of Times Moved in the Last Year: 0    Homeless in the Last Year: No  Utilities: Not At Risk (11/19/2024)   Epic    Threatened with loss of utilities: No  Health Literacy: Adequate Health Literacy (08/07/2023)   B1300 Health Literacy    Frequency of need for help with medical instructions: Rarely     Code Status   Code Status: Full Code  Review of Systems: All systems reviewed and negative except where noted in HPI.  Physical Exam: Vital signs in last 24 hours: Temp:  [97.7 F (36.5 C)-98.4 F (36.9 C)] 98.4 F (36.9 C) (12/11 0724) Pulse Rate:  [58-72] 68 (12/11 0724) Resp:  [16-18] 16 (12/11 0724) BP: (153-187)/(63-80) 167/69 (12/11 0724) SpO2:  [95 %-97 %] 95 % (12/11 0724) Last BM Date : 11/18/24  General:  Pleasant male in NAD Psych:  Cooperative. Normal mood and affect Eyes: Pupils equal Ears:  Normal auditory acuity Nose: No deformity, discharge or lesions Neck:  Supple, no masses felt Lungs:  Clear to auscultation.  Heart:  Regular rate, regular rhythm.  Abdomen:  Soft, nondistended, nontender, active bowel sounds, no masses felt Rectal :  Deferred Msk: Symmetrical without gross deformities.  Neurologic:  Alert, oriented, grossly normal neurologically Extremities : No edema Skin:  Intact without significant lesions.    Intake/Output from previous day: 12/10 0701 - 12/11 0700 In: 2640.8 [P.O.:300; I.V.:2040.8; IV Piggyback:300] Out: 900 [Urine:900] Intake/Output this shift:  No intake/output data recorded.   Vina Dasen, NP-C   11/21/2024, 10:05 AM

## 2024-11-20 NOTE — Progress Notes (Signed)
 PHARMACY - ANTICOAGULATION CONSULT NOTE  Pharmacy Consult for IV Heparin  Indication: pulmonary embolus  Allergies  Allergen Reactions   Amlodipine  Other (See Comments)    Leg swelling   Empagliflozin  Other (See Comments)    Euglycemic DKA   Amitriptyline  Other (See Comments)    Urinary retention   Lisinopril  Other (See Comments)    Hyperkalemia    Nortriptyline  Hcl Other (See Comments)    Vivid / bad dreams   Statins     Aches in Joints    Simvastatin  Other (See Comments)    Arthralgias    Patient Measurements: Height: 5' 10 (177.8 cm) IBW/kg (Calculated) : 73Height: 5' 10 (177.8 cm) Weight: 69.9 kg (154 lb) IBW/kg (Calculated) : 73 HEPARIN  DW (KG): 69.9  Vital Signs: Temp: 97.9 F (36.6 C) (12/10 1000) Temp Source: Oral (12/10 1000) BP: 187/80 (12/10 1100) Pulse Rate: 73 (12/10 1000)  Labs: Recent Labs    11/19/24 0917 11/19/24 1004 11/20/24 0448 11/20/24 1834  HGB 8.7* 8.5* 8.4*  --   HCT 26.4* 25.0* 25.0*  --   PLT 261  --  212  --   APTT  --   --  102* 53*  HEPARINUNFRC  --   --  >1.10*  --   CREATININE 3.02*  --  2.80*  --     Estimated Creatinine Clearance: 23.4 mL/min (A) (by C-G formula based on SCr of 2.8 mg/dL (H)).    Assessment: 68 yo male with recent diagnosis of pulmonary embolism (diagnosed 11/15/24) discharged on apixaban  (last dose 12/8 at 21:30 PM).  Returned to ED with abdominal pain and concern for cholecystitis vs choledocholithiasis with possible need for intervention. Pharmacy consulted to transition to IV heparin .   aPTT down to subtherapeutic (53 sec) on infusion at 1100 units/hr. No issues with line or bleeding reported per RN.  Goal of Therapy:  Heparin  level 0.3-0.7 units/ml aPTT 66-102 seconds Monitor platelets by anticoagulation protocol: Yes   Plan:  Increase heparin  back to 1200 units/hr F/u 8h PTT  Vito Ralph, PharmD, BCPS Please see amion for complete clinical pharmacist phone list 11/20/2024 7:06 PM

## 2024-11-20 NOTE — Assessment & Plan Note (Addendum)
 VSS w/ hypertension. Patient in significant RUQ and RLQ pain w/o peritonitis. CT AP and US  RUQ concern for dilated CBD but no stone on MRCP. Pancreatic cysts with possible malignancy. Gallbladder thickening. HIDA scan 12/10 pm to eval for gallbladder pathology. Potential for f/u CT w/ and w/o of pancreas.  - Admit to [facility] Surg with attending Dr. Orie - NPO - mIVF 100cc/hr LR while NPO.  - Gen Surg consulted, appreciate recs - Pain regimen held pending hida scan, resume this PM:  - Increase Dilaudid  2mg  q4h and 1g IV tylenol  for pain - Continue IV zosyn  for intra-abdominal coverage  -GI consulted, appreciate their recs

## 2024-11-20 NOTE — Assessment & Plan Note (Addendum)
 From prior hospitalization, pt very sensitive to insulin  dosing and goes hypoglycemic easily.  - Reduce LAI to 5u daily given NPO - Start sSSI very sensitive - CBG w/ meals once NPO d/c - Hold home metformin 

## 2024-11-20 NOTE — Progress Notes (Signed)
 OT Cancellation Note  Patient Details Name: NIKI COSMAN MRN: [id number] DOB: 07-13-1956   Cancelled Treatment:    Reason Eval/Treat Not Completed: Pain limiting ability to participate. Pt experiencing increased pain at this time and is requesting to wait on completing therapy evaluations. Will follow up with pt tomorrow and complete OT eval if able to participate.   Leita Howell, OTR/L,CBIS  Supplemental OT - [facility] Secure Chat Preferred   11/20/2024, 1:08 PM

## 2024-11-20 NOTE — Progress Notes (Signed)
 Subjective/Chief Complaint: Patient reports moderate to severe RUQ abdominal pain   Objective: Vital signs in last 24 hours: Temp:  [97.2 F (36.2 C)-98.9 F (37.2 C)] 98.3 F (36.8 C) (12/10 0442) Pulse Rate:  [62-72] 70 (12/10 0442) Resp:  [16-19] 18 (12/10 0442) BP: (109-179)/(62-84) 172/70 (12/10 0442) SpO2:  [97 %-100 %] 99 % (12/10 0442) Last BM Date : 11/18/24  Intake/Output from previous day: 12/09 0701 - 12/10 0700 In: 42.9 [IV Piggyback:42.9] Out: 400 [Urine:400] Intake/Output this shift: No intake/output data recorded.  Exam: Awake and alert Appears uncomfortable Abdomen soft, non-distended, mild to mod RUQ tenderness with some guarding  Lab Results:  Recent Labs    11/19/24 0917 11/19/24 1004 11/20/24 0448  WBC 12.1*  --  12.0*  HGB 8.7* 8.5* 8.4*  HCT 26.4* 25.0* 25.0*  PLT 261  --  212   BMET Recent Labs    11/19/24 0917 11/19/24 1004 11/20/24 0448  NA 134* 134* 138  K 3.9 3.9 4.1  CL 102  --  105  CO2 21*  --  21*  GLUCOSE 357*  --  173*  BUN 40*  --  33*  CREATININE 3.02*  --  2.80*  CALCIUM  8.4*  --  8.2*   PT/INR No results for input(s): LABPROT, INR in the last 72 hours. ABG Recent Labs    11/19/24 1004  HCO3 23.2    Studies/Results: MR ABDOMEN MRCP WO CONTRAST Result Date: 11/19/2024 CLINICAL DATA:  Abdominal pain. Gallbladder wall thickening and biliary ductal dilatation on recent ultrasound. EXAM: MRI ABDOMEN WITHOUT CONTRAST  (INCLUDING MRCP) TECHNIQUE: Multiplanar multisequence MR imaging of the abdomen was performed. Heavily T2-weighted images of the biliary and pancreatic ducts were obtained, and three-dimensional MRCP images were rendered by post processing. COMPARISON:  None Available. FINDINGS: Lower chest: No acute findings. Hepatobiliary:  No masses visualized on this unenhanced exam. The gallbladder is mildly distended and shows mild diffuse wall thickening. No definite gallstones are seen. Pericholecystic  edema is seen but is similar to diffuse mesenteric, retroperitoneal, and body wall edema seen throughout the abdomen. Diffuse biliary ductal dilatation is seen, with common bile duct measuring 14 mm in diameter. No evidence of choledocholithiasis. Stricture of the distal common bile duct is seen within the pancreatic head. Pancreas: Evaluation is limited by lack of intravenous contrast and some motion artifact. Multiple tiny cystic foci are seen within the pancreatic head at the site of distal common bile duct stricture. This may be due to chronic pancreatitis, however, pancreatic mass cannot definitely be excluded. No No evidence of main pancreatic ductal dilatation. Spleen:  Within normal limits in size. Adrenals/Urinary tract: Unremarkable. No evidence of nephrolithiasis or hydronephrosis. Stomach/Bowel: No dilated bowel loops. Diffuse mesenteric edema and minimal perihepatic ascites. Vascular/Lymphatic: No pathologically enlarged lymph nodes identified. No evidence of abdominal aortic aneurysm. Other:  None. Musculoskeletal:  No suspicious bone lesions identified. 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 No evidence of choledocholithiasis. Multiple tiny cystic foci within the pancreatic head at the site of distal common bile duct stricture. This may be due to chronic pancreatitis, however, pancreatic mass cannot definitely be excluded on this unenhanced exam. Recommend further imaging evaluation with pancreatic protocol abdomen CT without and with contrast Distended gallbladder with mild diffuse wall thickening, which is nonspecific. Differential diagnosis includes cholecystitis and other etiologies such as hypoalbuminemia, liver disease, or heart failure. Anasarca and minimal perihepatic ascites. Electronically Signed   By: Norleen DELENA Graham CHRISTELLA.D.  On: 11/19/2024 17:48   MR 3D Recon At Scanner Result Date: 11/19/2024 CLINICAL DATA:  Abdominal pain. Gallbladder wall  thickening and biliary ductal dilatation on recent ultrasound. EXAM: MRI ABDOMEN WITHOUT CONTRAST  (INCLUDING MRCP) TECHNIQUE: Multiplanar multisequence MR imaging of the abdomen was performed. Heavily T2-weighted images of the biliary and pancreatic ducts were obtained, and three-dimensional MRCP images were rendered by post processing. COMPARISON:  None Available. FINDINGS: Lower chest: No acute findings. Hepatobiliary:  No masses visualized on this unenhanced exam. The gallbladder is mildly distended and shows mild diffuse wall thickening. No definite gallstones are seen. Pericholecystic edema is seen but is similar to diffuse mesenteric, retroperitoneal, and body wall edema seen throughout the abdomen. Diffuse biliary ductal dilatation is seen, with common bile duct measuring 14 mm in diameter. No evidence of choledocholithiasis. Stricture of the distal common bile duct is seen within the pancreatic head. Pancreas: Evaluation is limited by lack of intravenous contrast and some motion artifact. Multiple tiny cystic foci are seen within the pancreatic head at the site of distal common bile duct stricture. This may be due to chronic pancreatitis, however, pancreatic mass cannot definitely be excluded. No No evidence of main pancreatic ductal dilatation. Spleen:  Within normal limits in size. Adrenals/Urinary tract: Unremarkable. No evidence of nephrolithiasis or hydronephrosis. Stomach/Bowel: No dilated bowel loops. Diffuse mesenteric edema and minimal perihepatic ascites. Vascular/Lymphatic: No pathologically enlarged lymph nodes identified. No evidence of abdominal aortic aneurysm. Other:  None. Musculoskeletal:  No suspicious bone lesions identified. 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 No evidence of choledocholithiasis. Multiple tiny cystic foci within the pancreatic head at the site of distal common bile duct stricture. This may be due to chronic  pancreatitis, however, pancreatic mass cannot definitely be excluded on this unenhanced exam. Recommend further imaging evaluation with pancreatic protocol abdomen CT without and with contrast Distended gallbladder with mild diffuse wall thickening, which is nonspecific. Differential diagnosis includes cholecystitis and other etiologies such as hypoalbuminemia, liver disease, or heart failure. Anasarca and minimal perihepatic ascites. Electronically Signed   By: Norleen DELENA Kil M.D.   On: 11/19/2024 17:48   US  Abdomen Limited RUQ (LIVER/GB) Result Date: 11/19/2024 EXAM: Right Upper Quadrant Abdominal Ultrasound 11/19/2024 11:48:11 AM TECHNIQUE: Real-time ultrasonography of the right upper quadrant of the abdomen was performed. COMPARISON: CT of the abdomen and pelvis dated 11/19/2024. CLINICAL HISTORY: Cholecystitis. FINDINGS: LIVER: The liver demonstrates normal echogenicity. No intrahepatic biliary ductal dilatation. No evidence of mass. BILIARY SYSTEM: Gallbladder wall thickness measures 7 mm. The patient is not focally tender over the gallbladder. No pericholecystic fluid. No cholelithiasis. The common bile duct is abnormally dilated, measuring 13.5 mm. No intrahepatic biliary ductal dilatation. OTHER: No right upper quadrant ascites. IMPRESSION: 1. Gallbladder wall thickening (7 mm) without focal tenderness. 2. Dilated common bile duct (13.5 mm) without intrahepatic biliary ductal dilatation. Electronically signed by: Evalene Coho MD 11/19/2024 12:10 PM EST RP Workstation: [id number]   CT ABDOMEN PELVIS WO CONTRAST Result Date: 11/19/2024 CLINICAL DATA:  Abdominal pain. EXAM: CT ABDOMEN AND PELVIS WITHOUT CONTRAST TECHNIQUE: Multidetector CT imaging of the abdomen and pelvis was performed following the standard protocol without IV contrast. RADIATION DOSE REDUCTION: This exam was performed according to the departmental dose-optimization program which includes automated exposure control, adjustment of the  mA and/or kV according to patient size and/or use of iterative reconstruction technique. COMPARISON:  06/20/2013 FINDINGS: Lower chest: Dependent atelectasis. Hepatobiliary: No suspicious focal abnormality in the liver on this study without  intravenous contrast. Gallbladder lumen is filled with high density material. This is a new finding since chest CTA 11/15/2024 and likely reflects vicarious excretion of intravenous contrast administered for that study. Gallbladder wall appears circumferentially thickened. Intrahepatic biliary duct dilatation evident. Common bile duct not well demonstrated on this noncontrast exam. Pancreas: No focal mass lesion. No dilatation of the main duct. No intraparenchymal cyst. No peripancreatic edema. Spleen: No splenomegaly. No suspicious focal mass lesion. Adrenals/Urinary Tract: No right adrenal nodule or mass. Soft tissue nodularity in the expected location of the left adrenal gland may reflect nodular thickening, small lymph nodes, or vascular anatomy. This is not well evaluated given lack of intravenous contrast material. Both kidneys are heterogeneous in appearance. Probable punctate stones bilaterally. No hydronephrosis. No hydroureter. The urinary bladder appears normal for the degree of distention. Stomach/Bowel: Stomach is distended with fluid. Duodenum is normally positioned as is the ligament of Treitz. No small bowel wall thickening. No small bowel dilatation. The terminal ileum is normal. The appendix is not well visualized, but there is no edema or inflammation in the region of the cecal tip to suggest appendicitis. No gross colonic mass. No colonic wall thickening. Vascular/Lymphatic: There is advanced atherosclerotic calcification of the abdominal aorta without aneurysm. Possible borderline lymphadenopathy in the left para-aortic space. No pelvic sidewall lymphadenopathy. Reproductive: The prostate gland and seminal vesicles are unremarkable. Other: Small volume  confluent edema is seen in the right upper quadrant of the abdomen adjacent to the gallbladder. Musculoskeletal: No worrisome lytic or sclerotic osseous abnormality. IMPRESSION: 1. Gallbladder lumen is filled with high density material, likely vicarious excretion of intravenous contrast administered for chest CTA 11/15/2024. Gallbladder wall appears circumferentially thickened with small volume confluent edema in the right upper quadrant of the abdomen adjacent to the gallbladder. Imaging features raise the question of acute cholecystitis. Right upper quadrant ultrasound may prove helpful. 2. Intrahepatic biliary duct dilatation. Common bile duct not well demonstrated on this noncontrast exam. Correlation with liver function test recommended. 3. Soft tissue nodularity in the expected location of the left adrenal gland may reflect nodular thickening, small lymph nodes, or vascular anatomy. This is not well evaluated given lack of intravenous contrast material. 4. Possible borderline lymphadenopathy in the left para-aortic space. 5.  Aortic Atherosclerosis (ICD10-I70.0). Electronically Signed   By: Camellia Candle M.D.   On: 11/19/2024 10:44    Anti-infectives: Anti-infectives (From admission, onward)    Start     Dose/Rate Route Frequency Ordered Stop   11/19/24 2000  piperacillin -tazobactam (ZOSYN ) IVPB 3.375 g        3.375 g 12.5 mL/hr over 240 Minutes Intravenous Every 8 hours 11/19/24 1901     11/19/24 1300  piperacillin -tazobactam (ZOSYN ) IVPB 3.375 g        3.375 g 100 mL/hr over 30 Minutes Intravenous  Once 11/19/24 1259 11/19/24 1508       Assessment/Plan: RUQ abdominal pain, gallbladder wall thickening, dilated bile ducts, increasing LFT's  -MRCP worrisome for bile duct stricture, possible pancreatic mass.  No stones are seen in the gallbladder or bile duct.  Cholecystitis not excluded given wall thickening.  Findings in the bile duct and pancreas worrisome for stricture and possible  malignancy -WBC still elevated -LFT's increasing  -recommend GI consult to consider EUS or ERCP. -will order a CA 19-9 and CEA -will order a HIDA scan to evaluate for cholecystitis.  If positive, will need IR consult for percutaneous cholecystostomy tube  I discussed the MRCP findings and suspicion for malignancy  with the patient  Complex medical decision making  Vicenta Poli MD 11/20/2024

## 2024-11-20 NOTE — Progress Notes (Addendum)
 PHARMACY - ANTICOAGULATION CONSULT NOTE  Pharmacy Consult for IV Heparin  Indication: pulmonary embolus  Allergies  Allergen Reactions   Amlodipine  Other (See Comments)    Leg swelling   Empagliflozin  Other (See Comments)    Euglycemic DKA   Amitriptyline  Other (See Comments)    Urinary retention   Lisinopril  Other (See Comments)    Hyperkalemia    Nortriptyline  Hcl Other (See Comments)    Vivid / bad dreams   Statins     Aches in Joints    Simvastatin  Other (See Comments)    Arthralgias    Patient Measurements: Height: 5' 10 (177.8 cm) IBW/kg (Calculated) : 73Height: 5' 10 (177.8 cm) Weight: 69.9 kg (154 lb) IBW/kg (Calculated) : 73 HEPARIN  DW (KG): 69.9  Vital Signs: Temp: 98.3 F (36.8 C) (12/10 0442) Temp Source: Oral (12/10 0442) BP: 172/70 (12/10 0442) Pulse Rate: 70 (12/10 0442)  Labs: Recent Labs    11/19/24 0917 11/19/24 1004 11/20/24 0448  HGB 8.7* 8.5* 8.4*  HCT 26.4* 25.0* 25.0*  PLT 261  --  212  APTT  --   --  102*  HEPARINUNFRC  --   --  >1.10*  CREATININE 3.02*  --  2.80*    Estimated Creatinine Clearance: 23.4 mL/min (A) (by C-G formula based on SCr of 2.8 mg/dL (H)).   Medical History: Past Medical History:  Diagnosis Date   Acute diastolic heart failure (HCC) 08/18/2018   AKI (acute kidney injury) 10/04/2023   Atherosclerosis of both carotid arteries 08/18/2020   Carotid dopplers 08/2019 40-59% bilateral stenosis and right subclavian stenosis   Bilateral carotid artery stenosis 08/18/2020   Right Carotid: Velocities in the right ICA are consistent with a 60-79%                 stenosis. Non-hemodynamically significant plaque <50% noted in                 the CCA. The ECA appears >50% stenosed. Proximal subclavian                 artery dilatation with elevated and turbulent flow just past the                 narrowing, measuring 1.6 cm.   Left Carotid: Velocities in the left ICA are    CHF (congestive heart failure) (HCC)    Chronic  congestive heart failure (HCC) 10/04/2023   Transthoracic echocardiogram 10/08/2018: LVEF is approximately 50% with hypokinesis of the lateral (base/mid) and basal inferior walls T     Chronic left shoulder pain 03/18/2021   Chronic low back pain without sciatica 10/16/2008   H/o chronic LBP with h/o lumbar disc herniation and intermittent radicular pain  Chronic pain, on stable doses of Norco 10/325 TID. MRI in 2003 showing SMALL CENTRAL DISC HERNIATION AT L4-5.  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      CKD (chronic kidney disease), stage III (HCC)    Coronary artery disease    Coronary artery disease involving native heart with angina pectoris, unspecified vessel or lesion type 08/10/2023   COVID 10/04/2023   Diabetes mellitus    DKA (diabetic ketoacidosis) (HCC) 10/03/2023   Dyspnea    Fall 10/04/2023   GERD (gastroesophageal reflux disease)    Hx of CABG 08/23/2018   LIMA to LAD, RIMA to RCA, SVG to OM3, EVH via right thigh   Hyperlipidemia    Hypertension    Hypertension associated with diabetes (  HCC) 12/22/2008   Qualifier: Diagnosis of  By: Edrick MD, Devere     Hypertensive nephropathy 08/18/2020   Insulin  dependent type 2 diabetes mellitus (HCC) 12/22/2020   Left ventricular dysfunction 08/18/2020   TTE ([id number]): LVEF is approximately 50% with hypokinesis of the lateral (base/mid) and basal inferior walls The cavity size was normal.      Long term (current) use of antithrombotics/antiplatelets 09/21/2022   Planned 30-months DAPT for DES for PAD on 09/21/2022.  Planned 6 months therapy. End 03/23/23.   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   LIMA to LAD, RIMA to RCA, SVG to OM3, EVH via right thigh   Sleeping difficulties 09/08/2019   Subclavian artery stenosis, right 08/18/2020   Carotid dopplers 08/2019 40-59% bilateral stenosis and right subclavian stenosis   Tobacco abuse    Tobacco abuse disorder    Qualifier: Diagnosis of  By: Edrick MD, Devere      Medications:  Scheduled:   acetaminophen   1,000 mg Oral Q6H   Or   acetaminophen   650 mg Rectal Q6H   aspirin  EC  81 mg Oral Daily   carvedilol   6.25 mg Oral BID WC   diltiazem   180 mg Oral Daily   insulin  aspart  0-6 Units Subcutaneous TID WC   lidocaine   3 patch Transdermal Q24H   pantoprazole   40 mg Oral Daily   tamsulosin   0.4 mg Oral Daily   Infusions:   heparin  1,200 Units/hr (11/19/24 1947)   lactated ringers  100 mL/hr at 11/20/24 0351   piperacillin -tazobactam (ZOSYN )  IV 3.375 g (11/20/24 0347)    Assessment: 68 yo male with recent diagnosis of pulmonary embolism (diagnosed 11/15/24) discharged on apixaban  (last dose 12/8 at 21:30 PM).  Returned to ED with abdominal pain and concern for cholecystitis vs choledocholithiasis with possible need for intervention. Pharmacy consulted to transition to IV heparin .   Hemoglobin is low stable at 8.4. Platelets are within normal limits.  Noted to have an acute on chronic kidney injury with SCr elevated but improving with IVF (baseline ~2).  Patient currently NPO for procedures.  aPTT 102, heparin  level >1.1 (as expected after recent apixaban  use).  Will dose heparin  based on aPTT goals until levels correlate.  aPTT at upper end of goal on heparin  at 1200 units/hr.  No bleeding noted.  Goal of Therapy:  Heparin  level 0.3-0.7 units/ml aPTT 66-102 seconds Monitor platelets by anticoagulation protocol: Yes   Plan:  Decrease heparin  to 1100 units/hr Repeat aPTT in 8 hours for  confirmation Daily heparin  level, CBC, and aPTT  [employer], Pharm.D., BCPS Clinical Pharmacist Clinical phone for 11/20/2024 from 7:30-3:00 is [phone].  **Pharmacist phone directory can be found on [email or web address] listed under [facility].  11/20/2024 9:12 AM

## 2024-11-20 NOTE — Progress Notes (Signed)
 Daily Progress Note Intern Pager: [phone]  Patient name: Colin Keller Medical record number: [id number] Date of birth: [birth year] Age: 68 y.o. Gender: male  Primary Care Provider: McDiarmid, Krystal BIRCH, MD Consultants: Gen surg, GI Code Status: Full  Pt Overview and Major Events to Date:  12-9 admitted, gen surg consulted 12-10 gi consulted  Medical Decision Making:  14 m w/ hx of recent hospitalization w/ PE  Assessment & Plan Abdominal pain VSS w/ hypertension. Patient in significant RUQ and RLQ pain w/o peritonitis. CT AP and US  RUQ concern for dilated CBD but no stone on MRCP. Pancreatic cysts with possible malignancy. Gallbladder thickening. HIDA scan 12/10 pm to eval for gallbladder pathology. Potential for f/u CT w/ and w/o of pancreas.  - Admit to FMTS Med Surg with attending Dr. Orie - NPO - mIVF 100cc/hr LR while NPO.  - Gen Surg consulted, appreciate recs - Pain regimen held pending hida scan, resume this PM:  - Increase Dilaudid  2mg  q4h and 1g IV tylenol  for pain - Continue IV zosyn  for intra-abdominal coverage  -GI consulted, appreciate their recs Acute pulmonary embolism (HCC) Recently discharged on eliquis . Normal wob on room air. - Continue heparin  drip per pharm for PE - Hold eliquis  for potential procedure T2DM (type 2 diabetes mellitus) (HCC) From prior hospitalization, pt very sensitive to insulin  dosing and goes hypoglycemic easily.  - Reduce LAI to 5u daily given NPO - Start sSSI very sensitive - CBG w/ meals once NPO d/c - Hold home metformin  AKI (acute kidney injury) Acute on chronic kidney injury, likely pre-renal in setting of poor PO.  - / hr mIVF w/ LR - holding home lasix , hydral, hydrochlorothiazide , dilt - AM CMP, Mg, CBC Chronic health problem HTN/CAD/CHF: Continue home Coreg   BPH: Continue home tamsulosin  GERD: Continue home Protonix    FEN/GI: NPO PPx: heparin  drip Dispo:Pending clinical improvement    Subjective:   Patient endorses significant R sided abdominal pain. Discussed briefly the results of his imaging to this point. Patient expressed his pain interfering w/ his ability to focus on medical conversations.  Objective: Temp:  [97.2 F (36.2 C)-98.9 F (37.2 C)] 98.3 F (36.8 C) (12/10 0442) Pulse Rate:  [62-72] 70 (12/10 0442) Resp:  [16-19] 18 (12/10 0442) BP: (109-179)/(62-84) 172/70 (12/10 0442) SpO2:  [97 %-100 %] 99 % (12/10 0442) Physical Exam: General: Awake, alert, significant distress from abd pain. Communicates clearly. Cardio: RRR. Normal S1, S2. No murmur, rub, gallop. 2+ radial and dorsalis pedis pulses b/l w/ good capillary refill. Resp: CTA bilaterally. No wheezes, rales, or rhonchi. Normal work of breathing on room air Abdomen: soft, non distended. Significant TTP in RUQ and RLQ abdomen with some guarding. No rigidity or rebound appreciated.   Laboratory: Most recent CBC Lab Results  Component Value Date   WBC 12.0 (H) 11/20/2024   HGB 8.4 (L) 11/20/2024   HCT 25.0 (L) 11/20/2024   MCV 96.9 11/20/2024   PLT 212 11/20/2024   Most recent BMP    Latest Ref Rng & Units 11/20/2024    4:48 AM  BMP  Glucose 70 - 99 mg/dL 826   BUN 8 - 23 mg/dL 33   Creatinine 9.38 - 1.24 mg/dL 7.19   Sodium 864 - 854 mmol/L 138   Potassium 3.5 - 5.1 mmol/L 4.1   Chloride 98 - 111 mmol/L 105   CO2 22 - 32 mmol/L 21   Calcium  8.9 - 10.3 mg/dL 8.2    Mg 1.7  Lipase WNL AST 51 ALT 35 -> 137, 85 Alk Phos 274 -> 320 UA w/ glucose > 500 protein > 300 rare bacteria, no leuks or nitrites  Imaging/Diagnostic Tests:  CXR: No acute CP process  MRCP:  Diffuse biliary ductal dilatation due to distal common bile duct stricture. No evidence of choledocholithiasis.   Multiple tiny cystic foci within the pancreatic head at the site of distal common bile duct stricture. This may be due to chronic pancreatitis, however, pancreatic mass cannot definitely be excluded on this unenhanced exam.  Recommend further imaging evaluation with pancreatic protocol abdomen CT without and with contrast   Distended gallbladder with mild diffuse wall thickening, which is nonspecific. Differential diagnosis includes cholecystitis and other etiologies such as hypoalbuminemia, liver disease, or heart failure.   Anasarca and minimal perihepatic ascites.   RUQ US  1. Gallbladder wall thickening (7 mm) without focal tenderness. 2. Dilated common bile duct (13.5 mm) without intrahepatic biliary ductal dilatation.  CT AP w/o  1. Gallbladder lumen is filled with high density material, likely vicarious excretion of intravenous contrast administered for chest CTA 11/15/2024. Gallbladder wall appears circumferentially thickened with small volume confluent edema in the right upper quadrant of the abdomen adjacent to the gallbladder. Imaging features raise the question of acute cholecystitis. Right upper quadrant ultrasound may prove helpful. 2. Intrahepatic biliary duct dilatation. Common bile duct not well demonstrated on this noncontrast exam. Correlation with liver function test recommended. 3. Soft tissue nodularity in the expected location of the left adrenal gland may reflect nodular thickening, small lymph nodes, or vascular anatomy. This is not well evaluated given lack of intravenous contrast material. 4. Possible borderline lymphadenopathy in the left para-aortic space. 5.  Aortic Atherosclerosis (ICD10-I70.0).  Manon Jester, DO 11/20/2024, 8:11 AM  PGY-1, [facility] FPTS Intern pager: [phone], text pages welcome Secure chat group [facility] Teaching Service

## 2024-11-20 NOTE — Progress Notes (Signed)
 BG recheck at 0100 111. Glucometer did not upload result.

## 2024-11-20 NOTE — Assessment & Plan Note (Addendum)
 Recently discharged on eliquis . Normal wob on room air. - Continue heparin  drip per pharm for PE - Hold eliquis  for potential procedure

## 2024-11-20 NOTE — Assessment & Plan Note (Addendum)
 Acute on chronic kidney injury, likely pre-renal in setting of poor PO.  - / hr mIVF w/ LR - holding home lasix , hydral, hydrochlorothiazide , dilt - AM CMP, Mg, CBC

## 2024-11-20 NOTE — Assessment & Plan Note (Signed)
 HTN/CAD/CHF: Continue home Coreg   BPH: Continue home tamsulosin  GERD: Continue home Protonix 

## 2024-11-20 NOTE — Plan of Care (Signed)
 Spoke with pt wife and daughter at bedside. Updated them on current plan to get HIDA scan, GI consult, and discussed current workup of pancreatic lesions. Discussed that there is a concern for malignancy but will need further testing to confirm.

## 2024-11-20 NOTE — Progress Notes (Signed)
 PT Cancellation Note  Patient Details Name: Colin Keller MRN: [id number] DOB: 1956-10-06   Cancelled Treatment:    Reason Eval/Treat Not Completed: Pain limiting ability to participate (Currently pt is in significant pain. Unable to participate. Will follow up as able and appropriate.)  Colin Keller, DPT, CLT  Acute Rehabilitation Services Office: [phone] (Secure chat preferred)   Colin Keller 11/20/2024, 1:08 PM

## 2024-11-21 ENCOUNTER — Inpatient Hospital Stay

## 2024-11-21 DIAGNOSIS — K869 Disease of pancreas, unspecified: Secondary | ICD-10-CM

## 2024-11-21 DIAGNOSIS — R933 Abnormal findings on diagnostic imaging of other parts of digestive tract: Secondary | ICD-10-CM

## 2024-11-21 DIAGNOSIS — Z7901 Long term (current) use of anticoagulants: Secondary | ICD-10-CM

## 2024-11-21 DIAGNOSIS — K831 Obstruction of bile duct: Secondary | ICD-10-CM

## 2024-11-21 LAB — CBC
HCT: 25.6 % — ABNORMAL LOW (ref 39.0–52.0)
Hemoglobin: 8.9 g/dL — ABNORMAL LOW (ref 13.0–17.0)
MCH: 33.1 pg (ref 26.0–34.0)
MCHC: 34.8 g/dL (ref 30.0–36.0)
MCV: 95.2 fL (ref 80.0–100.0)
Platelets: 244 K/uL (ref 150–400)
RBC: 2.69 MIL/uL — ABNORMAL LOW (ref 4.22–5.81)
RDW: 12.8 % (ref 11.5–15.5)
WBC: 17.9 K/uL — ABNORMAL HIGH (ref 4.0–10.5)
nRBC: 0 % (ref 0.0–0.2)

## 2024-11-21 LAB — COMPREHENSIVE METABOLIC PANEL WITH GFR
ALT: 187 U/L — ABNORMAL HIGH (ref 0–44)
AST: 289 U/L — ABNORMAL HIGH (ref 15–41)
Albumin: 2 g/dL — ABNORMAL LOW (ref 3.5–5.0)
Alkaline Phosphatase: 540 U/L — ABNORMAL HIGH (ref 38–126)
Anion gap: 10 (ref 5–15)
BUN: 34 mg/dL — ABNORMAL HIGH (ref 8–23)
CO2: 23 mmol/L (ref 22–32)
Calcium: 8 mg/dL — ABNORMAL LOW (ref 8.9–10.3)
Chloride: 101 mmol/L (ref 98–111)
Creatinine, Ser: 2.51 mg/dL — ABNORMAL HIGH (ref 0.61–1.24)
GFR, Estimated: 27 mL/min — ABNORMAL LOW (ref >60)
Glucose, Bld: 200 mg/dL — ABNORMAL HIGH (ref 70–99)
Potassium: 3.7 mmol/L (ref 3.5–5.1)
Sodium: 134 mmol/L — ABNORMAL LOW (ref 135–145)
Total Bilirubin: 3.9 mg/dL — ABNORMAL HIGH (ref 0.0–1.2)
Total Protein: 5.8 g/dL — ABNORMAL LOW (ref 6.5–8.1)

## 2024-11-21 LAB — GLUCOSE, CAPILLARY
Glucose-Capillary: 236 mg/dL — ABNORMAL HIGH (ref 70–99)
Glucose-Capillary: 279 mg/dL — ABNORMAL HIGH (ref 70–99)
Glucose-Capillary: 212 mg/dL — ABNORMAL HIGH (ref 70–99)
Glucose-Capillary: 157 mg/dL — ABNORMAL HIGH (ref 70–99)

## 2024-11-21 LAB — APTT
aPTT: 94 s — ABNORMAL HIGH (ref 24–36)
aPTT: 130 s — ABNORMAL HIGH (ref 24–36)

## 2024-11-21 LAB — HEPARIN LEVEL (UNFRACTIONATED)
Heparin Unfractionated: >1.1 [IU]/mL — ABNORMAL HIGH (ref 0.30–0.70)
Heparin Unfractionated: >1.1 [IU]/mL — ABNORMAL HIGH (ref 0.30–0.70)

## 2024-11-21 LAB — MAGNESIUM: Magnesium: 1.8 mg/dL (ref 1.7–2.4)

## 2024-11-21 LAB — CEA: CEA: 3.7 ng/mL (ref 0.0–4.7)

## 2024-11-21 LAB — CANCER ANTIGEN 19-9: CA 19-9: 113 U/mL — ABNORMAL HIGH (ref 0–35)

## 2024-11-21 MED ORDER — SENNA 8.6 MG PO TABS
1.0000 | ORAL_TABLET | Freq: Every day | ORAL | Status: DC
  Administered 2024-11-21 – 2024-12-01 (×9): 8.6 mg via ORAL
  Filled 2024-11-21 (×9): qty 1

## 2024-11-21 MED ORDER — POLYETHYLENE GLYCOL 3350 17 G PO PACK
17.0000 g | PACK | Freq: Every day | ORAL | Status: DC
  Administered 2024-11-21 – 2024-11-29 (×6): 17 g via ORAL
  Filled 2024-11-21 (×7): qty 1

## 2024-11-21 MED ORDER — INDOMETHACIN 50 MG RE SUPP: 100.0000 mg | Freq: Once | RECTAL | Status: DC

## 2024-11-21 NOTE — Evaluation (Signed)
 Physical Therapy Evaluation Patient Details Name: Colin Keller MRN: [id number] DOB: [birth year] Today's Date: 11/21/2024  History of Present Illness  68 yo M adm 12/9 with RUQ abdominal pain, acute PE started heparin , AKI PMH DM2, chronic AKI, CAD CHF HTN,CABG, PVD, recent d/c on 12/7 for posterior basilar segmental artery PE in the left lower lobe, discharged home with Eliquis    Clinical Impression  Pt admitted with above diagnosis. PTA pt lived at home with wife, independent. Pt currently with functional limitations due to the deficits listed below (see PT Problem List). On eval, pt demo mod I bed mobility. Supervision transfers, and CGA amb 200' without AD. Pt will benefit from acute skilled PT to increase their independence and safety with mobility to allow discharge. PT to follow acutely. No follow services indicated. No DME needs.          If plan is discharge home, recommend the following: Assistance with cooking/housework;Assist for transportation;A little help with bathing/dressing/bathroom   Can travel by private vehicle        Equipment Recommendations None recommended by PT  Recommendations for Other Services       Functional Status Assessment Patient has had a recent decline in their functional status and demonstrates the ability to make significant improvements in function in a reasonable and predictable amount of time.     Precautions / Restrictions Precautions Precautions: None Recall of Precautions/Restrictions: Intact      Mobility  Bed Mobility Overal bed mobility: Modified Independent                  Transfers Overall transfer level: Needs assistance Equipment used: None Transfers: Sit to/from Stand Sit to Stand: Supervision                Ambulation/Gait Ambulation/Gait assistance: Contact guard assist Gait Distance (Feet): 200 Feet Assistive device: None Gait Pattern/deviations: Step-through pattern, Decreased stride length, Drifts  right/left Gait velocity: decreased Gait velocity interpretation: 1.31 - 2.62 ft/sec, indicative of limited community ambulator   General Gait Details: mildly unsteady with occasional use of handrail  Stairs            Wheelchair Mobility     Tilt Bed    Modified Rankin (Stroke Patients Only)       Balance Overall balance assessment: Mild deficits observed, not formally tested                                           Pertinent Vitals/Pain Pain Assessment Pain Assessment: Faces Faces Pain Scale: Hurts little more Pain Location: abdomen Pain Descriptors / Indicators: Sore Pain Intervention(s): Monitored during session, Repositioned    Home Living Family/patient expects to be discharged to:: Private residence Living Arrangements: Spouse/significant other Available Help at Discharge: Family;Available 24 hours/day Type of Home: Mobile home Home Access: Stairs to enter   Entrance Stairs-Number of Steps: 1   Home Layout: One level Home Equipment: Shower seat      Prior Function Prior Level of Function : Independent/Modified Independent;Driving                     Extremity/Trunk Assessment   Upper Extremity Assessment Upper Extremity Assessment: Generalized weakness    Lower Extremity Assessment Lower Extremity Assessment: Generalized weakness    Cervical / Trunk Assessment Cervical / Trunk Assessment: Kyphotic  Communication   Communication Communication: No apparent difficulties  Cognition Arousal: Alert Behavior During Therapy: WFL for tasks assessed/performed   PT - Cognitive impairments: No apparent impairments                         Following commands: Intact       Cueing Cueing Techniques: Verbal cues     General Comments General comments (skin integrity, edema, etc.): VSS on RA    Exercises     Assessment/Plan    PT Assessment Patient needs continued PT services  PT Problem List  Decreased strength;Decreased mobility;Decreased activity tolerance;Decreased balance       PT Treatment Interventions Therapeutic exercise;Gait training;Balance training;Functional mobility training;Therapeutic activities;Patient/family education    PT Goals (Current goals can be found in the Care Plan section)  Acute Rehab PT Goals Patient Stated Goal: home PT Goal Formulation: With patient Time For Goal Achievement: 12/05/24 Potential to Achieve Goals: Good    Frequency Min 2X/week     Co-evaluation               AM-PAC PT 6 Clicks Mobility  Outcome Measure Help needed turning from your back to your side while in a flat bed without using bedrails?: None Help needed moving from lying on your back to sitting on the side of a flat bed without using bedrails?: None Help needed moving to and from a bed to a chair (including a wheelchair)?: A Little Help needed standing up from a chair using your arms (e.g., wheelchair or bedside chair)?: A Little Help needed to walk in hospital room?: A Little Help needed climbing 3-5 steps with a railing? : A Little 6 Click Score: 20    End of Session Equipment Utilized During Treatment: Gait belt Activity Tolerance: Patient tolerated treatment well Patient left: in bed;with call bell/phone within reach;with family/visitor present Nurse Communication: Mobility status PT Visit Diagnosis: Muscle weakness (generalized) (M62.81)    Time: 9157-9095 PT Time Calculation (min) (ACUTE ONLY): 22 min   Charges:   PT Evaluation $PT Eval Moderate Complexity: 1 Mod   PT General Charges $$ ACUTE PT VISIT: 1 Visit         Sari MATSU., PT  Office # [phone]   Erven Sari Shaker 11/21/2024, 9:54 AM

## 2024-11-21 NOTE — Assessment & Plan Note (Addendum)
 Pain improved 12/11 AM. WBCs and LFTs uptrending. HIDA confirmed CBD stricture, ERCP scheduled for 12/12pm.  - Pain regimen: sch oxycodone  10mg  q6, tylenol  1g q8, Dilaudid  1mg  q4 PRN - Bowel regimen w/ miralax bid and senna daily - Zofran  4mg  q6 prn - Continue IV zosyn  for concern for possible cholangitis, although less likely given clinical picture  -GI consulted, appreciate their recs -Gen Surg consulted, appreciate recs

## 2024-11-21 NOTE — Progress Notes (Signed)
 PHARMACY - ANTICOAGULATION CONSULT NOTE  Pharmacy Consult for IV Heparin  Indication: pulmonary embolus  Allergies  Allergen Reactions   Amlodipine  Other (See Comments)    Leg swelling   Empagliflozin  Other (See Comments)    Euglycemic DKA   Amitriptyline  Other (See Comments)    Urinary retention   Lisinopril  Other (See Comments)    Hyperkalemia    Nortriptyline  Hcl Other (See Comments)    Vivid / bad dreams   Statins     Aches in Joints    Simvastatin  Other (See Comments)    Arthralgias    Patient Measurements: Height: 5' 10 (177.8 cm) IBW/kg (Calculated) : 73Height: 5' 10 (177.8 cm) Weight: 69.9 kg (154 lb) IBW/kg (Calculated) : 73 HEPARIN  DW (KG): 69.9  Vital Signs: Temp: 97.7 F (36.5 C) (12/11 0505) Temp Source: Oral (12/11 0505) BP: 167/70 (12/11 0505) Pulse Rate: 67 (12/11 0505)  Labs: Recent Labs    11/19/24 0917 11/19/24 1004 11/20/24 0448 11/20/24 1834 11/21/24 0424  HGB 8.7* 8.5* 8.4*  --  8.9*  HCT 26.4* 25.0* 25.0*  --  25.6*  PLT 261  --  212  --  244  APTT  --   --  102* 53* 94*  HEPARINUNFRC  --   --  >1.10*  --  >1.10*  CREATININE 3.02*  --  2.80*  --   --     Estimated Creatinine Clearance: 23.4 mL/min (A) (by C-G formula based on SCr of 2.8 mg/dL (H)).    Assessment: 68 yo male with recent diagnosis of pulmonary embolism (diagnosed 11/15/24) discharged on apixaban  (last dose 12/8 at 21:30 PM).  Returned to ED with abdominal pain and concern for cholecystitis vs choledocholithiasis with possible need for intervention. Pharmacy consulted to transition to IV heparin .   aPTT down to subtherapeutic (53 sec) on infusion at 1100 units/hr. No issues with line or bleeding reported per RN.  12/11 AM update:  aPTT therapeutic  Goal of Therapy:  Heparin  level 0.3-0.7 units/ml aPTT 66-102 seconds Monitor platelets by anticoagulation protocol: Yes   Plan:  Cont heparin  1200 units/hr Heparin  level and aPTT in 8 hours  [name] Mckusick,  PharmD, BCPS Clinical Pharmacist Phone: [phone]

## 2024-11-21 NOTE — Progress Notes (Addendum)
 ERCP scheduled for 1 PM tomorrow with Dr. Abran.  I have notified nursing and pharmacy about holding IV heparin  at 9 AM for the ERCP  Addendum: Will hold IV heparin  at 7 am instead of 9 am. Pharmacy and Nursing notified

## 2024-11-21 NOTE — Assessment & Plan Note (Addendum)
 Recently discharged on eliquis . Normal wob on room air. - Continue heparin  drip per pharm for PE - Hold eliquis  for potential procedure

## 2024-11-21 NOTE — Assessment & Plan Note (Addendum)
 HTN/CAD/CHF: Continue home Coreg , diltiazem  BPH: Continue home tamsulosin  GERD: Continue home Protonix 

## 2024-11-21 NOTE — Inpatient Diabetes Management (Signed)
 Inpatient Diabetes Program Recommendations  AACE/ADA: New Consensus Statement on Inpatient Glycemic Control (2015)  Target Ranges:  Prepandial:   less than 140 mg/dL      Peak postprandial:   less than 180 mg/dL (1-2 hours)      Critically ill patients:  140 - 180 mg/dL   Lab Results  Component Value Date   GLUCAP 216 (H) 11/20/2024   HGBA1C 9.6 (H) 11/15/2024    Review of Glycemic Control  Latest Reference Range & Units 11/20/24 08:05 11/20/24 11:50 11/20/24 16:41 11/20/24 22:06  Glucose-Capillary 70 - 99 mg/dL 781 (H) 802 (H) 823 (H) 216 (H)  (H): Data is abnormally high  Diabetes history: DM2 Outpatient Diabetes medications:  Basaglar  10 units QAM Humalog  3-5 units TID  Freestyle Libre 3 Plus Current orders for Inpatient glycemic control:  Semglee  5 units every day   Inpatient Diabetes Program Recommendations:    If appropriate, please consider increasing basal insulin  slightly:  Semglee  6 units every day.  Thank you, Wyvonna Pinal, MSN, CDCES Diabetes Coordinator Inpatient Diabetes Program [phone] (team pager from 8a-5p)

## 2024-11-21 NOTE — Progress Notes (Signed)
°  Patient seen in consult today though note dated 12/10. .    Consult note was started on 12/10 but consult not done until today as patient was out of room in HIDA when I went to see him yesterday. I refreshed the note but unfortunately the date didn't refresh to today's date.

## 2024-11-21 NOTE — Progress Notes (Signed)
 PHARMACY - ANTICOAGULATION CONSULT NOTE  Pharmacy Consult for IV Heparin  Indication: pulmonary embolus  Allergies  Allergen Reactions   Amlodipine  Other (See Comments)    Leg swelling   Empagliflozin  Other (See Comments)    Euglycemic DKA   Amitriptyline  Other (See Comments)    Urinary retention   Lisinopril  Other (See Comments)    Hyperkalemia    Nortriptyline  Hcl Other (See Comments)    Vivid / bad dreams   Statins     Aches in Joints    Simvastatin  Other (See Comments)    Arthralgias    Patient Measurements: Height: 5' 10 (177.8 cm) IBW/kg (Calculated) : 73Height: 5' 10 (177.8 cm) Weight: 69.9 kg (154 lb) IBW/kg (Calculated) : 73 HEPARIN  DW (KG): 69.9  Vital Signs: Temp: 98.4 F (36.9 C) (12/11 0724) Temp Source: Oral (12/11 0724) BP: 167/69 (12/11 0724) Pulse Rate: 68 (12/11 0724)  Labs: Recent Labs    11/19/24 0917 11/19/24 1004 11/19/24 1004 11/20/24 0448 11/20/24 1834 11/21/24 0424 11/21/24 1409  HGB 8.7* 8.5*  --  8.4*  --  8.9*  --   HCT 26.4* 25.0*  --  25.0*  --  25.6*  --   PLT 261  --   --  212  --  244  --   APTT  --   --    < > 102* 53* 94* 130*  HEPARINUNFRC  --   --   --  >1.10*  --  >1.10* >1.10*  CREATININE 3.02*  --   --  2.80*  --  2.51*  --    < > = values in this interval not displayed.    Estimated Creatinine Clearance: 26.1 mL/min (A) (by C-G formula based on SCr of 2.51 mg/dL (H)).    Assessment: 68 yo male with recent diagnosis of pulmonary embolism (diagnosed 11/15/24) discharged on apixaban  (last dose 12/8 at 21:30 PM).  Returned to ED with abdominal pain and concern for cholecystitis vs choledocholithiasis with possible need for intervention. Pharmacy consulted to transition to IV heparin .   aPTT is supratherapeutic at 130 sec on infusion at 1200 units/hr. No issues with line or bleeding reported per RN. Given recent apixaban  use, will monitor anticoagulation using aPTT until aPTT and heparin  levels correlate.   Goal of  Therapy:  Heparin  level 0.3-0.7 units/ml aPTT 66-102 seconds Monitor platelets by anticoagulation protocol: Yes   Plan:  Decrease heparin  drip to 1050 units/hr F/u 8h PTT Daily heparin  level, CBC, and aPTT  Monitor for s/sx of bleeding  Thank you for involving pharmacy in this patient's care.  Delon Sax, PharmD, BCPS Clinical Pharmacist Clinical phone for 11/21/2024 is [phone] 11/21/2024 3:05 PM

## 2024-11-21 NOTE — Assessment & Plan Note (Addendum)
 From prior hospitalization, pt very sensitive to insulin  dosing and goes hypoglycemic easily.  - Continue LAI 5u daily - Continue SSI very sensitive - CBG w/ meals  - Hold home metformin 

## 2024-11-21 NOTE — Progress Notes (Signed)
 Progress Note     Subjective: Wife at bedside. Pt reports some epigastric and RUQ abodminal pain that is worsened a little with eating. Pt and wife report GI has already been by this morning and are planning likely ERCP tomorrow.   Objective: Vital signs in last 24 hours: Temp:  [97.7 F (36.5 C)-98.4 F (36.9 C)] 98.4 F (36.9 C) (12/11 0724) Pulse Rate:  [58-72] 68 (12/11 0724) Resp:  [16-18] 16 (12/11 0724) BP: (153-187)/(63-80) 167/69 (12/11 0724) SpO2:  [95 %-97 %] 95 % (12/11 0724) Last BM Date : 11/18/24  Intake/Output from previous day: 12/10 0701 - 12/11 0700 In: 2640.8 [P.O.:300; I.V.:2040.8; IV Piggyback:300] Out: 900 [Urine:900] Intake/Output this shift: No intake/output data recorded.  PE: General: pleasant, WD, thin male who is laying in bed in NAD HEENT: sclera are icteric Heart: regular, rate, and rhythm.   Lungs: Respiratory effort nonlabored Abd: soft, mild epigastric and RUQ ttp, ND Skin:jaundiced Psych: A&Ox3 with an appropriate affect.    Lab Results:  Recent Labs    11/20/24 0448 11/21/24 0424  WBC 12.0* 17.9*  HGB 8.4* 8.9*  HCT 25.0* 25.6*  PLT 212 244   BMET Recent Labs    11/20/24 0448 11/21/24 0424  NA 138 134*  K 4.1 3.7  CL 105 101  CO2 21* 23  GLUCOSE 173* 200*  BUN 33* 34*  CREATININE 2.80* 2.51*  CALCIUM  8.2* 8.0*   PT/INR No results for input(s): LABPROT, INR in the last 72 hours. CMP     Component Value Date/Time   NA 134 (L) 11/21/2024 0424   NA 137 10/30/2024 1126   K 3.7 11/21/2024 0424   CL 101 11/21/2024 0424   CO2 23 11/21/2024 0424   GLUCOSE 200 (H) 11/21/2024 0424   BUN 34 (H) 11/21/2024 0424   BUN 38 (H) 10/30/2024 1126   CREATININE 2.51 (H) 11/21/2024 0424   CREATININE 1.28 11/24/2023 0000   CALCIUM  8.0 (L) 11/21/2024 0424   PROT 5.8 (L) 11/21/2024 0424   PROT 6.2 06/11/2024 1046   ALBUMIN  2.0 (L) 11/21/2024 0424   ALBUMIN  3.6 (L) 06/11/2024 1046   AST 289 (H) 11/21/2024 0424   ALT 187  (H) 11/21/2024 0424   ALKPHOS 540 (H) 11/21/2024 0424   BILITOT 3.9 (H) 11/21/2024 0424   BILITOT <0.2 06/11/2024 1046   GFRNONAA 27 (L) 11/21/2024 0424   GFRNONAA 62 08/12/2015 0943   GFRAA 55 (L) 12/10/2020 1643   GFRAA 71 08/12/2015 0943   Lipase     Component Value Date/Time   LIPASE 28 11/19/2024 0917       Studies/Results: NM Hepatobiliary Liver Func Result Date: 11/20/2024 EXAM: NM HEPATOBILLARY SCAN 11/20/2024 04:18:31 PM TECHNIQUE: RADIOPHARMACEUTICAL: 7.5 mCi Tc-16m mebrofenin Dynamic images of the abdomen and pelvis were obtained in the anterior projection for 90 minutes after intravenous administration of radiopharmaceutical. COMPARISON: MRI 11/19/2024. CLINICAL HISTORY: Cholecystitis. FINDINGS: There is uniform uptake within the liver. No activity is present within the common bile duct over the entirety of the exam. The gallbladder cannot be assessed without filling of the common duct. Findings concerning for distal common bile duct obstruction in light of the prior day MRCP. IMPRESSION: 1. Findings concerning for distal common bile duct obstruction in light of the prior day MRCP. Electronically signed by: Norleen Boxer MD 11/20/2024 04:26 PM EST RP Workstation: [id number]   MR ABDOMEN MRCP WO CONTRAST Result Date: 11/19/2024 CLINICAL DATA:  Abdominal pain. Gallbladder wall thickening and biliary ductal dilatation on recent ultrasound.  EXAM: MRI ABDOMEN WITHOUT CONTRAST  (INCLUDING MRCP) TECHNIQUE: Multiplanar multisequence MR imaging of the abdomen was performed. Heavily T2-weighted images of the biliary and pancreatic ducts were obtained, and three-dimensional MRCP images were rendered by post processing. COMPARISON:  None Available. FINDINGS: Lower chest: No acute findings. Hepatobiliary:  No masses visualized on this unenhanced exam. The gallbladder is mildly distended and shows mild diffuse wall thickening. No definite gallstones are seen. Pericholecystic edema is seen but is  similar to diffuse mesenteric, retroperitoneal, and body wall edema seen throughout the abdomen. Diffuse biliary ductal dilatation is seen, with common bile duct measuring 14 mm in diameter. No evidence of choledocholithiasis. Stricture of the distal common bile duct is seen within the pancreatic head. Pancreas: Evaluation is limited by lack of intravenous contrast and some motion artifact. Multiple tiny cystic foci are seen within the pancreatic head at the site of distal common bile duct stricture. This may be due to chronic pancreatitis, however, pancreatic mass cannot definitely be excluded. No No evidence of main pancreatic ductal dilatation. Spleen:  Within normal limits in size. Adrenals/Urinary tract: Unremarkable. No evidence of nephrolithiasis or hydronephrosis. Stomach/Bowel: No dilated bowel loops. Diffuse mesenteric edema and minimal perihepatic ascites. Vascular/Lymphatic: No pathologically enlarged lymph nodes identified. No evidence of abdominal aortic aneurysm. Other:  None. Musculoskeletal:  No suspicious bone lesions identified. 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 No evidence of choledocholithiasis. Multiple tiny cystic foci within the pancreatic head at the site of distal common bile duct stricture. This may be due to chronic pancreatitis, however, pancreatic mass cannot definitely be excluded on this unenhanced exam. Recommend further imaging evaluation with pancreatic protocol abdomen CT without and with contrast Distended gallbladder with mild diffuse wall thickening, which is nonspecific. Differential diagnosis includes cholecystitis and other etiologies such as hypoalbuminemia, liver disease, or heart failure. Anasarca and minimal perihepatic ascites. Electronically Signed   By: Norleen DELENA Kil M.D.   On: 11/19/2024 17:48   MR 3D Recon At Scanner Result Date: 11/19/2024 CLINICAL DATA:  Abdominal pain. Gallbladder wall thickening and biliary  ductal dilatation on recent ultrasound. EXAM: MRI ABDOMEN WITHOUT CONTRAST  (INCLUDING MRCP) TECHNIQUE: Multiplanar multisequence MR imaging of the abdomen was performed. Heavily T2-weighted images of the biliary and pancreatic ducts were obtained, and three-dimensional MRCP images were rendered by post processing. COMPARISON:  None Available. FINDINGS: Lower chest: No acute findings. Hepatobiliary:  No masses visualized on this unenhanced exam. The gallbladder is mildly distended and shows mild diffuse wall thickening. No definite gallstones are seen. Pericholecystic edema is seen but is similar to diffuse mesenteric, retroperitoneal, and body wall edema seen throughout the abdomen. Diffuse biliary ductal dilatation is seen, with common bile duct measuring 14 mm in diameter. No evidence of choledocholithiasis. Stricture of the distal common bile duct is seen within the pancreatic head. Pancreas: Evaluation is limited by lack of intravenous contrast and some motion artifact. Multiple tiny cystic foci are seen within the pancreatic head at the site of distal common bile duct stricture. This may be due to chronic pancreatitis, however, pancreatic mass cannot definitely be excluded. No No evidence of main pancreatic ductal dilatation. Spleen:  Within normal limits in size. Adrenals/Urinary tract: Unremarkable. No evidence of nephrolithiasis or hydronephrosis. Stomach/Bowel: No dilated bowel loops. Diffuse mesenteric edema and minimal perihepatic ascites. Vascular/Lymphatic: No pathologically enlarged lymph nodes identified. No evidence of abdominal aortic aneurysm. Other:  None. Musculoskeletal:  No suspicious bone lesions identified. IMPRESSION: Diffuse biliary ductal dilatation due to  distal common bile duct stricture. No evidence of choledocholithiasis. Multiple tiny cystic foci within the pancreatic head at the site of distal common bile duct stricture. This may be due to chronic pancreatitis, however, pancreatic  mass cannot definitely be excluded on this unenhanced exam. Recommend further imaging evaluation with pancreatic protocol abdomen CT without and with contrast Distended gallbladder with mild diffuse wall thickening, which is nonspecific. Differential diagnosis includes cholecystitis and other etiologies such as hypoalbuminemia, liver disease, or heart failure. Anasarca and minimal perihepatic ascites. Electronically Signed   By: Norleen DELENA Kil M.D.   On: 11/19/2024 17:48   US  Abdomen Limited RUQ (LIVER/GB) Result Date: 11/19/2024 EXAM: Right Upper Quadrant Abdominal Ultrasound 11/19/2024 11:48:11 AM TECHNIQUE: Real-time ultrasonography of the right upper quadrant of the abdomen was performed. COMPARISON: CT of the abdomen and pelvis dated 11/19/2024. CLINICAL HISTORY: Cholecystitis. FINDINGS: LIVER: The liver demonstrates normal echogenicity. No intrahepatic biliary ductal dilatation. No evidence of mass. BILIARY SYSTEM: Gallbladder wall thickness measures 7 mm. The patient is not focally tender over the gallbladder. No pericholecystic fluid. No cholelithiasis. The common bile duct is abnormally dilated, measuring 13.5 mm. No intrahepatic biliary ductal dilatation. OTHER: No right upper quadrant ascites. IMPRESSION: 1. Gallbladder wall thickening (7 mm) without focal tenderness. 2. Dilated common bile duct (13.5 mm) without intrahepatic biliary ductal dilatation. Electronically signed by: Evalene Coho MD 11/19/2024 12:10 PM EST RP Workstation: [id number]    Anti-infectives: Anti-infectives (From admission, onward)    Start     Dose/Rate Route Frequency Ordered Stop   11/19/24 2000  piperacillin -tazobactam (ZOSYN ) IVPB 3.375 g        3.375 g 12.5 mL/hr over 240 Minutes Intravenous Every 8 hours 11/19/24 1901     11/19/24 1300  piperacillin -tazobactam (ZOSYN ) IVPB 3.375 g        3.375 g 100 mL/hr over 30 Minutes Intravenous  Once 11/19/24 1259 11/19/24 1508        Assessment/Plan  Biliary  obstruction  - CT 12/9 with thickened gallbladder, intrahepatic biliary ductal dilation, soft tissue nodularity in location of expected left adrenal gland, possible borderline lymphadenopathy in left para-aortic space - RUQ US  12/9 with gallbladder wall thickening without focal tenderness and dilated common duct - MRCP 12/9 without choledocholithiasis, multiple tiny cystic foci in pancreatic head, distended gallbladder with mild diffuse wall thickening  which is nonspecific, anasarca and minimal perihepatic ascites - no clear cholelithiasis noted on any imaging modality  - HIDA yesterday with no CBD activity and gallbladder can't really be assessed in that setting - findings suggestive of distal CBD obstruction  - Tbili is 3.9 from 1.1 on admit, Alk Phos/AST/ALT all rising as well and patient is jaundiced - GI following and planning ERCP/EUS - pt with worsening leukocytosis of 17K but may be more related to biliary obstruction than cholecystitis, would continue abx regardless right now - general surgery will follow up GI workup but no acute surgical intervention planned at this time  FEN: CLD, IVF per [facility] VTE: hep gtt ID: [id number]   - per [facility] -  Acute PE - hep gtt CAD s/p CABG HTN HLD CHFrEF T2DM AKI on CKD PAD OSA Chronic back pain Hx of tobacco abuse   LOS: 2 days   I reviewed Consultant GI notes, hospitalist notes, last 24 h vitals and pain scores, last 48 h intake and output, last 24 h labs and trends, and last 24 h imaging results.  This care required moderate level of medical decision making.    Burnard SAUNDERS  [facility] 11/21/2024, 10:52 AM Please see Amion for pager number during day hours 7:00am-4:30pm

## 2024-11-21 NOTE — Progress Notes (Signed)
 OT Cancellation Note  Patient Details Name: Colin Keller MRN: [id number] DOB: Nov 18, 1956   Cancelled Treatment:    Reason Eval/Treat Not Completed: Patient declined, no reason specified.  Second check, increased pain, may check back one more time.    Malay Fantroy D Elisavet Buehrer 11/21/2024, 1:39 PM 11/21/2024  RP, OTR/L  Acute Rehabilitation Services  Office:  [phone]

## 2024-11-21 NOTE — Assessment & Plan Note (Addendum)
 Acute on chronic kidney injury, likely pre-renal in setting of poor PO. Cr improving, 2.51 from 2.80 on 12/11am.  - D/c IVF - holding home lasix , hydral, hydrochlorothiazide , dilt - AM CMP, Mg, CBC

## 2024-11-21 NOTE — Progress Notes (Addendum)
 Daily Progress Note Intern Pager: [phone]  Patient name: Colin Keller Medical record number: [id number] Date of birth: 1956/06/30 Age: 68 y.o. Gender: male  Primary Care Provider: McDiarmid, Krystal BIRCH, MD Consultants: Gen surg, GI Code Status: Full  Pt Overview and Major Events to Date:  12/9 admitted  Medical Decision Making:  68yo m w/ hx of recent PE hospitalized for abdominal pain. Imaging thus far showing stricture and dilation of CBD along w/ pancreatic cysts w/ questionable malignancy. ErCP w/ GI at 1pm 12/12.  Assessment & Plan Abdominal pain Pain improved 12/11 AM. WBCs and LFTs uptrending. HIDA confirmed CBD stricture, ERCP scheduled for 12/12pm.  - Pain regimen: sch oxycodone  10mg  q6, tylenol  1g q8, Dilaudid  1mg  q4 PRN - Bowel regimen w/ miralax bid and senna daily - Zofran  4mg  q6 prn - Continue IV zosyn  for concern for possible cholangitis, although less likely given clinical picture  -GI consulted, appreciate their recs -Gen Surg consulted, appreciate recs Acute pulmonary embolism (HCC) Recently discharged on eliquis . Normal wob on room air. - Continue heparin  drip per pharm for PE - Hold eliquis  for potential procedure T2DM (type 2 diabetes mellitus) (HCC) From prior hospitalization, pt very sensitive to insulin  dosing and goes hypoglycemic easily.  - Continue LAI 5u daily - Continue SSI very sensitive - CBG w/ meals  - Hold home metformin  AKI (acute kidney injury) Acute on chronic kidney injury, likely pre-renal in setting of poor PO. Cr improving, 2.51 from 2.80 on 12/11am.  - D/c IVF - holding home lasix , hydral, hydrochlorothiazide , dilt - AM CMP, Mg, CBC Chronic health problem HTN/CAD/CHF: Continue home Coreg , diltiazem  BPH: Continue home tamsulosin  GERD: Continue home Protonix    FEN/GI: clear liquids, NPO @MN  PPx: heparin  drip, hold at 7am Dispo: Pending clinical improvement  Subjective:  Reports continued pain but was able to get some sleep  which is an improvement from yesterday. Tolerating some light food w/o emesis. No BM so far.   Objective: Temp:  [97.7 F (36.5 C)-98.4 F (36.9 C)] 98.4 F (36.9 C) (12/11 0724) Pulse Rate:  [58-73] 68 (12/11 0724) Resp:  [16-18] 16 (12/11 0724) BP: (153-187)/(63-80) 167/69 (12/11 0724) SpO2:  [95 %-100 %] 95 % (12/11 0724) Physical Exam: General: Awake, alert, NAD. Communicates clearly. Cardio: RRR. Normal S1, S2. No murmur, rub, gallop. 2+ radial and dorsalis pedis pulses b/l w/ good capillary refill. Resp: CTA bilaterally. No wheezes, rales, or rhonchi. Normal work of breathing on room air.  Abdomen: soft, non-distended. Tenderness to deep palpation in RLQ. Negative murphy's sign.  Mild guarding to palpation w/o rigidity or rebound.   Laboratory: Most recent CBC Lab Results  Component Value Date   WBC 17.9 (H) 11/21/2024   HGB 8.9 (L) 11/21/2024   HCT 25.6 (L) 11/21/2024   MCV 95.2 11/21/2024   PLT 244 11/21/2024   Most recent BMP    Latest Ref Rng & Units 11/21/2024    4:24 AM  BMP  Glucose 70 - 99 mg/dL 799   BUN 8 - 23 mg/dL 34   Creatinine 9.38 - 1.24 mg/dL 7.48   Sodium 864 - 854 mmol/L 134   Potassium 3.5 - 5.1 mmol/L 3.7   Chloride 98 - 111 mmol/L 101   CO2 22 - 32 mmol/L 23   Calcium  8.9 - 10.3 mg/dL 8.0    AST 710 ALT 812 Alk Phos 540 Ca 8.0 CEA CA 19-9 both pending  Imaging/Diagnostic Tests:  HIDA: 1. Findings concerning for distal common bile  duct obstruction in light of the prior day MRCP.  Manon Jester, DO 11/21/2024, 7:30 AM  PGY-1, [facility] FPTS Intern pager: [phone], text pages welcome Secure chat group [facility] Teaching Service

## 2024-11-22 ENCOUNTER — Encounter (HOSPITAL_COMMUNITY): Admission: EM | Disposition: A | Discharge status: Home Health | Payer: Self-pay | Source: Home / Self Care

## 2024-11-22 ENCOUNTER — Inpatient Hospital Stay (HOSPITAL_COMMUNITY)

## 2024-11-22 ENCOUNTER — Inpatient Hospital Stay (HOSPITAL_COMMUNITY): Admitting: Anesthesiology

## 2024-11-22 ENCOUNTER — Encounter (HOSPITAL_COMMUNITY): Payer: Self-pay | Admitting: Family Medicine

## 2024-11-22 DIAGNOSIS — K8309 Other cholangitis: Secondary | ICD-10-CM

## 2024-11-22 DIAGNOSIS — R4182 Altered mental status, unspecified: Secondary | ICD-10-CM

## 2024-11-22 DIAGNOSIS — D649 Anemia, unspecified: Secondary | ICD-10-CM

## 2024-11-22 DIAGNOSIS — K831 Obstruction of bile duct: Principal | ICD-10-CM

## 2024-11-22 DIAGNOSIS — R001 Bradycardia, unspecified: Secondary | ICD-10-CM

## 2024-11-22 DIAGNOSIS — E119 Type 2 diabetes mellitus without complications: Secondary | ICD-10-CM

## 2024-11-22 DIAGNOSIS — D72829 Elevated white blood cell count, unspecified: Secondary | ICD-10-CM

## 2024-11-22 DIAGNOSIS — F22 Delusional disorders: Secondary | ICD-10-CM

## 2024-11-22 DIAGNOSIS — N179 Acute kidney failure, unspecified: Secondary | ICD-10-CM

## 2024-11-22 DIAGNOSIS — R7401 Elevation of levels of liver transaminase levels: Secondary | ICD-10-CM

## 2024-11-22 DIAGNOSIS — Z7901 Long term (current) use of anticoagulants: Secondary | ICD-10-CM | POA: Diagnosis not present

## 2024-11-22 DIAGNOSIS — R748 Abnormal levels of other serum enzymes: Secondary | ICD-10-CM

## 2024-11-22 DIAGNOSIS — K838 Other specified diseases of biliary tract: Secondary | ICD-10-CM

## 2024-11-22 HISTORY — PX: ERCP: SHX5425

## 2024-11-22 HISTORY — PX: SPHINCTEROTOMY: SHX5279

## 2024-11-22 HISTORY — PX: BILIARY STENT PLACEMENT: SHX5538

## 2024-11-22 LAB — COMPREHENSIVE METABOLIC PANEL WITH GFR
ALT: 198 U/L — ABNORMAL HIGH (ref 0–44)
ALT: 169 U/L — ABNORMAL HIGH (ref 0–44)
AST: 231 U/L — ABNORMAL HIGH (ref 15–41)
AST: 154 U/L — ABNORMAL HIGH (ref 15–41)
Albumin: 2 g/dL — ABNORMAL LOW (ref 3.5–5.0)
Albumin: 2 g/dL — ABNORMAL LOW (ref 3.5–5.0)
Alkaline Phosphatase: 590 U/L — ABNORMAL HIGH (ref 38–126)
Alkaline Phosphatase: 582 U/L — ABNORMAL HIGH (ref 38–126)
Anion gap: 10 (ref 5–15)
Anion gap: 14 (ref 5–15)
BUN: 41 mg/dL — ABNORMAL HIGH (ref 8–23)
BUN: 50 mg/dL — ABNORMAL HIGH (ref 8–23)
CO2: 25 mmol/L (ref 22–32)
CO2: 22 mmol/L (ref 22–32)
Calcium: 8.4 mg/dL — ABNORMAL LOW (ref 8.9–10.3)
Calcium: 8.1 mg/dL — ABNORMAL LOW (ref 8.9–10.3)
Chloride: 99 mmol/L (ref 98–111)
Chloride: 97 mmol/L — ABNORMAL LOW (ref 98–111)
Creatinine, Ser: 2.95 mg/dL — ABNORMAL HIGH (ref 0.61–1.24)
Creatinine, Ser: 3.27 mg/dL — ABNORMAL HIGH (ref 0.61–1.24)
GFR, Estimated: 22 mL/min — ABNORMAL LOW (ref >60)
GFR, Estimated: 20 mL/min — ABNORMAL LOW (ref >60)
Glucose, Bld: 154 mg/dL — ABNORMAL HIGH (ref 70–99)
Glucose, Bld: 312 mg/dL — ABNORMAL HIGH (ref 70–99)
Potassium: 3.7 mmol/L (ref 3.5–5.1)
Potassium: 4.2 mmol/L (ref 3.5–5.1)
Sodium: 134 mmol/L — ABNORMAL LOW (ref 135–145)
Sodium: 133 mmol/L — ABNORMAL LOW (ref 135–145)
Total Bilirubin: 4.2 mg/dL — ABNORMAL HIGH (ref 0.0–1.2)
Total Bilirubin: 3.9 mg/dL — ABNORMAL HIGH (ref 0.0–1.2)
Total Protein: 5.9 g/dL — ABNORMAL LOW (ref 6.5–8.1)
Total Protein: 5.9 g/dL — ABNORMAL LOW (ref 6.5–8.1)

## 2024-11-22 LAB — CBC
HCT: 24.3 % — ABNORMAL LOW (ref 39.0–52.0)
HCT: 19.8 % — ABNORMAL LOW (ref 39.0–52.0)
HCT: 20.7 % — ABNORMAL LOW (ref 39.0–52.0)
Hemoglobin: 8.4 g/dL — ABNORMAL LOW (ref 13.0–17.0)
Hemoglobin: 6.8 g/dL — CL (ref 13.0–17.0)
Hemoglobin: 6.9 g/dL — CL (ref 13.0–17.0)
MCH: 32.7 pg (ref 26.0–34.0)
MCH: 33 pg (ref 26.0–34.0)
MCH: 31.9 pg (ref 26.0–34.0)
MCHC: 34.6 g/dL (ref 30.0–36.0)
MCHC: 34.3 g/dL (ref 30.0–36.0)
MCHC: 33.3 g/dL (ref 30.0–36.0)
MCV: 94.6 fL (ref 80.0–100.0)
MCV: 96.1 fL (ref 80.0–100.0)
MCV: 95.8 fL (ref 80.0–100.0)
Platelets: 258 K/uL (ref 150–400)
Platelets: 210 K/uL (ref 150–400)
Platelets: 217 K/uL (ref 150–400)
RBC: 2.57 MIL/uL — ABNORMAL LOW (ref 4.22–5.81)
RBC: 2.06 MIL/uL — ABNORMAL LOW (ref 4.22–5.81)
RBC: 2.16 MIL/uL — ABNORMAL LOW (ref 4.22–5.81)
RDW: 13.2 % (ref 11.5–15.5)
RDW: 13.3 % (ref 11.5–15.5)
RDW: 13.2 % (ref 11.5–15.5)
WBC: 22.6 K/uL — ABNORMAL HIGH (ref 4.0–10.5)
WBC: 16.8 K/uL — ABNORMAL HIGH (ref 4.0–10.5)
WBC: 16.7 K/uL — ABNORMAL HIGH (ref 4.0–10.5)
nRBC: 0.1 % (ref 0.0–0.2)
nRBC: 0 % (ref 0.0–0.2)
nRBC: 0 % (ref 0.0–0.2)

## 2024-11-22 LAB — BLOOD GAS, VENOUS
Acid-base deficit: 1.8 mmol/L (ref 0.0–2.0)
Bicarbonate: 23.7 mmol/L (ref 20.0–28.0)
Drawn by: 6013
O2 Saturation: 49.6 %
Patient temperature: 35.3
pCO2, Ven: 39 mmHg — ABNORMAL LOW (ref 44–60)
pH, Ven: 7.38 (ref 7.25–7.43)
pO2, Ven: <31 mmHg — CL (ref 32–45)

## 2024-11-22 LAB — BASIC METABOLIC PANEL WITH GFR
Anion gap: 13 (ref 5–15)
BUN: 54 mg/dL — ABNORMAL HIGH (ref 8–23)
CO2: 22 mmol/L (ref 22–32)
Calcium: 7.9 mg/dL — ABNORMAL LOW (ref 8.9–10.3)
Chloride: 101 mmol/L (ref 98–111)
Creatinine, Ser: 3.43 mg/dL — ABNORMAL HIGH (ref 0.61–1.24)
GFR, Estimated: 18 mL/min — ABNORMAL LOW (ref >60)
Glucose, Bld: 237 mg/dL — ABNORMAL HIGH (ref 70–99)
Potassium: 3.7 mmol/L (ref 3.5–5.1)
Sodium: 136 mmol/L (ref 135–145)

## 2024-11-22 LAB — MAGNESIUM: Magnesium: 2.1 mg/dL (ref 1.7–2.4)

## 2024-11-22 LAB — GLUCOSE, CAPILLARY
Glucose-Capillary: 160 mg/dL — ABNORMAL HIGH (ref 70–99)
Glucose-Capillary: 247 mg/dL — ABNORMAL HIGH (ref 70–99)
Glucose-Capillary: 316 mg/dL — ABNORMAL HIGH (ref 70–99)
Glucose-Capillary: 286 mg/dL — ABNORMAL HIGH (ref 70–99)
Glucose-Capillary: 230 mg/dL — ABNORMAL HIGH (ref 70–99)
Glucose-Capillary: 213 mg/dL — ABNORMAL HIGH (ref 70–99)
Glucose-Capillary: 239 mg/dL — ABNORMAL HIGH (ref 70–99)

## 2024-11-22 LAB — APTT: aPTT: 87 s — ABNORMAL HIGH (ref 24–36)

## 2024-11-22 LAB — POCT I-STAT, CHEM 8
BUN: 48 mg/dL — ABNORMAL HIGH (ref 8–23)
Calcium, Ion: 1.11 mmol/L — ABNORMAL LOW (ref 1.15–1.40)
Chloride: 99 mmol/L (ref 98–111)
Creatinine, Ser: 3.3 mg/dL — ABNORMAL HIGH (ref 0.61–1.24)
Glucose, Bld: 299 mg/dL — ABNORMAL HIGH (ref 70–99)
HCT: 22 % — ABNORMAL LOW (ref 39.0–52.0)
Hemoglobin: 7.5 g/dL — ABNORMAL LOW (ref 13.0–17.0)
Potassium: 3.8 mmol/L (ref 3.5–5.1)
Sodium: 136 mmol/L (ref 135–145)
TCO2: 22 mmol/L (ref 22–32)

## 2024-11-22 LAB — AMMONIA: Ammonia: 21 umol/L (ref 9–35)

## 2024-11-22 LAB — MRSA NEXT GEN BY PCR, NASAL: MRSA by PCR Next Gen: NOT DETECTED

## 2024-11-22 LAB — TROPONIN I (HIGH SENSITIVITY): Troponin I (High Sensitivity): 25 ng/L — ABNORMAL HIGH (ref <18)

## 2024-11-22 LAB — HEPARIN LEVEL (UNFRACTIONATED): Heparin Unfractionated: >1.1 [IU]/mL — ABNORMAL HIGH (ref 0.30–0.70)

## 2024-11-22 LAB — PREPARE RBC (CROSSMATCH)

## 2024-11-22 SURGERY — ERCP, WITH INTERVENTION IF INDICATED
Anesthesia: General

## 2024-11-22 MED ORDER — HEPARIN (PORCINE) 25000 UT/250ML-% IV SOLN
1050.0000 [IU]/h | INTRAVENOUS | Status: DC
  Filled 2024-11-22 (×2): qty 250

## 2024-11-22 MED ORDER — PHENYLEPHRINE 80 MCG/ML (10ML) SYRINGE FOR IV PUSH (FOR BLOOD PRESSURE SUPPORT)
PREFILLED_SYRINGE | INTRAVENOUS | Status: DC | PRN
  Administered 2024-11-22 (×2): 160 ug via INTRAVENOUS

## 2024-11-22 MED ORDER — ONDANSETRON HCL 4 MG/2ML IJ SOLN: 4.0000 mg | Freq: Once | INTRAMUSCULAR | Status: DC | PRN

## 2024-11-22 MED ORDER — MELATONIN 5 MG PO TABS
5.0000 mg | ORAL_TABLET | Freq: Every evening | ORAL | Status: DC | PRN
  Administered 2024-11-22 – 2024-12-05 (×6): 5 mg via ORAL
  Filled 2024-11-22 (×7): qty 1

## 2024-11-22 MED ORDER — VASOPRESSIN 20 UNIT/ML IV SOLN
INTRAVENOUS | Status: DC | PRN
  Administered 2024-11-22 (×2): 2 [IU] via INTRAVENOUS
  Administered 2024-11-22: 1 [IU] via INTRAVENOUS

## 2024-11-22 MED ORDER — DOCUSATE SODIUM 100 MG PO CAPS: 100.0000 mg | ORAL_CAPSULE | Freq: Two times a day (BID) | ORAL | Status: DC | PRN

## 2024-11-22 MED ORDER — LIDOCAINE 2% (20 MG/ML) 5 ML SYRINGE
INTRAMUSCULAR | Status: DC | PRN
  Administered 2024-11-22 (×2): 100 mg via INTRAVENOUS

## 2024-11-22 MED ORDER — DICLOFENAC SUPPOSITORY 100 MG
RECTAL | Status: DC | PRN
  Administered 2024-11-22: 100 mg via RECTAL

## 2024-11-22 MED ORDER — ACETAMINOPHEN 10 MG/ML IV SOLN
1000.0000 mg | Freq: Four times a day (QID) | INTRAVENOUS | Status: AC
  Administered 2024-11-23 (×2): 1000 mg via INTRAVENOUS
  Filled 2024-11-22 (×2): qty 100

## 2024-11-22 MED ORDER — SODIUM CHLORIDE 0.9 % IV SOLN
INTRAVENOUS | Status: DC | PRN
  Administered 2024-11-22: 75 mL

## 2024-11-22 MED ORDER — PROPOFOL 10 MG/ML IV BOLUS
INTRAVENOUS | Status: DC | PRN
  Administered 2024-11-22: 100 mg via INTRAVENOUS

## 2024-11-22 MED ORDER — EPINEPHRINE 1 MG/10ML IV SOSY
PREFILLED_SYRINGE | INTRAVENOUS | Status: DC | PRN
  Administered 2024-11-22: 10 ug via INTRAVENOUS

## 2024-11-22 MED ORDER — INSULIN ASPART 100 UNIT/ML IJ SOLN: 5.0000 [IU] | Freq: Once | INTRAMUSCULAR | Status: DC

## 2024-11-22 MED ORDER — NALOXONE HCL 0.4 MG/ML IJ SOLN
INTRAMUSCULAR | Status: AC
  Filled 2024-11-22: qty 1

## 2024-11-22 MED ORDER — ROCURONIUM BROMIDE 10 MG/ML (PF) SYRINGE
PREFILLED_SYRINGE | INTRAVENOUS | Status: DC | PRN
  Administered 2024-11-22: 50 mg via INTRAVENOUS

## 2024-11-22 MED ORDER — HYDROMORPHONE HCL 1 MG/ML IJ SOLN: 0.5000 mg | INTRAMUSCULAR | Status: DC | PRN

## 2024-11-22 MED ORDER — DEXAMETHASONE SOD PHOSPHATE PF 10 MG/ML IJ SOLN
INTRAMUSCULAR | Status: DC | PRN
  Administered 2024-11-22: 10 mg via INTRAVENOUS

## 2024-11-22 MED ORDER — ALBUMIN HUMAN 5 % IV SOLN: INTRAVENOUS | Status: DC | PRN

## 2024-11-22 MED ORDER — TAMSULOSIN HCL 0.4 MG PO CAPS
0.4000 mg | ORAL_CAPSULE | Freq: Every day | ORAL | Status: DC
  Administered 2024-11-22 – 2024-12-06 (×14): 0.4 mg via ORAL
  Filled 2024-11-22 (×14): qty 1

## 2024-11-22 MED ORDER — INSULIN ASPART 100 UNIT/ML IJ SOLN
0.0000 [IU] | INTRAMUSCULAR | Status: DC
  Administered 2024-11-22: 5 [IU] via SUBCUTANEOUS
  Administered 2024-11-23: 8 [IU] via SUBCUTANEOUS
  Administered 2024-11-23: 5 [IU] via SUBCUTANEOUS
  Administered 2024-11-23: 11 [IU] via SUBCUTANEOUS
  Administered 2024-11-23: 8 [IU] via SUBCUTANEOUS
  Administered 2024-11-23: 5 [IU] via SUBCUTANEOUS
  Administered 2024-11-23: 15 [IU] via SUBCUTANEOUS
  Administered 2024-11-24 (×2): 5 [IU] via SUBCUTANEOUS
  Administered 2024-11-24 (×3): 8 [IU] via SUBCUTANEOUS
  Administered 2024-11-24 – 2024-11-25 (×2): 2 [IU] via SUBCUTANEOUS
  Administered 2024-11-25: 05:00:00 11 [IU] via SUBCUTANEOUS
  Administered 2024-11-25: 09:00:00 8 [IU] via SUBCUTANEOUS
  Administered 2024-11-25: 12:00:00 5 [IU] via SUBCUTANEOUS
  Administered 2024-11-26: 21:00:00 8 [IU] via SUBCUTANEOUS
  Administered 2024-11-26: 12:00:00 5 [IU] via SUBCUTANEOUS
  Administered 2024-11-26: 06:00:00 2 [IU] via SUBCUTANEOUS
  Administered 2024-11-26: 18:00:00 8 [IU] via SUBCUTANEOUS
  Administered 2024-11-27: 09:00:00 3 [IU] via SUBCUTANEOUS
  Administered 2024-11-27: 05:00:00 2 [IU] via SUBCUTANEOUS
  Administered 2024-11-27: 01:00:00 5 [IU] via SUBCUTANEOUS
  Filled 2024-11-22: qty 5
  Filled 2024-11-22: qty 11
  Filled 2024-11-22: qty 3
  Filled 2024-11-22: qty 8
  Filled 2024-11-22: qty 2
  Filled 2024-11-22: qty 15
  Filled 2024-11-22 (×3): qty 2
  Filled 2024-11-22: qty 11
  Filled 2024-11-22 (×2): qty 8
  Filled 2024-11-22: qty 11
  Filled 2024-11-22: qty 8
  Filled 2024-11-22: qty 1
  Filled 2024-11-22 (×2): qty 8
  Filled 2024-11-22 (×2): qty 5
  Filled 2024-11-22: qty 3
  Filled 2024-11-22 (×2): qty 5
  Filled 2024-11-22: qty 2
  Filled 2024-11-22: qty 8
  Filled 2024-11-22: qty 5
  Filled 2024-11-22: qty 8

## 2024-11-22 MED ORDER — FENTANYL CITRATE (PF) 100 MCG/2ML IJ SOLN
INTRAMUSCULAR | Status: AC
  Filled 2024-11-22: qty 2

## 2024-11-22 MED ORDER — ATROPINE SULFATE 1 MG/10ML IJ SOSY
PREFILLED_SYRINGE | INTRAMUSCULAR | Status: AC
  Filled 2024-11-22: qty 10

## 2024-11-22 MED ORDER — OXYCODONE HCL 5 MG PO TABS: 5.0000 mg | ORAL_TABLET | Freq: Once | ORAL | Status: DC | PRN

## 2024-11-22 MED ORDER — CHLORHEXIDINE GLUCONATE CLOTH 2 % EX PADS
6.0000 | MEDICATED_PAD | Freq: Every day | CUTANEOUS | Status: DC
  Administered 2024-11-22 – 2024-12-06 (×14): 6 via TOPICAL

## 2024-11-22 MED ORDER — OXYCODONE HCL 5 MG PO TABS: 5.0000 mg | ORAL_TABLET | ORAL | Status: DC | PRN

## 2024-11-22 MED ORDER — SUGAMMADEX SODIUM 200 MG/2ML IV SOLN
INTRAVENOUS | Status: DC | PRN
  Administered 2024-11-22: 200 mg via INTRAVENOUS

## 2024-11-22 MED ORDER — ATROPINE SULFATE 1 MG/10ML IJ SOSY
PREFILLED_SYRINGE | INTRAMUSCULAR | Status: DC | PRN
  Administered 2024-11-22: .4 mg via INTRAVENOUS

## 2024-11-22 MED ORDER — PIPERACILLIN-TAZOBACTAM 3.375 G IVPB
3.3750 g | Freq: Three times a day (TID) | INTRAVENOUS | Status: DC
  Administered 2024-11-22 – 2024-11-23 (×2): 3.375 g via INTRAVENOUS
  Filled 2024-11-22 (×2): qty 50

## 2024-11-22 MED ORDER — INSULIN ASPART 100 UNIT/ML IJ SOLN
8.0000 [IU] | Freq: Once | INTRAMUSCULAR | Status: AC
  Administered 2024-11-22: 8 [IU] via SUBCUTANEOUS

## 2024-11-22 MED ORDER — EPHEDRINE SULFATE-NACL 50-0.9 MG/10ML-% IV SOSY
PREFILLED_SYRINGE | INTRAVENOUS | Status: DC | PRN
  Administered 2024-11-22 (×2): 10 mg via INTRAVENOUS

## 2024-11-22 MED ORDER — FENTANYL CITRATE (PF) 100 MCG/2ML IJ SOLN: 25.0000 ug | INTRAMUSCULAR | Status: DC | PRN

## 2024-11-22 MED ORDER — INSULIN GLARGINE 100 UNIT/ML ~~LOC~~ SOLN: 8.0000 [IU] | Freq: Every day | SUBCUTANEOUS | Status: DC

## 2024-11-22 MED ORDER — GLYCOPYRROLATE PF 0.2 MG/ML IJ SOSY
PREFILLED_SYRINGE | INTRAMUSCULAR | Status: DC | PRN
  Administered 2024-11-22 (×2): .2 mg via INTRAVENOUS

## 2024-11-22 MED ORDER — GLUCAGON HCL RDNA (DIAGNOSTIC) 1 MG IJ SOLR
INTRAMUSCULAR | Status: DC | PRN
  Administered 2024-11-22: .5 mg via INTRAVENOUS

## 2024-11-22 MED ORDER — ACETAMINOPHEN 10 MG/ML IV SOLN: 1000.0000 mg | Freq: Once | INTRAVENOUS | Status: DC | PRN

## 2024-11-22 MED ORDER — OXYCODONE HCL 5 MG/5ML PO SOLN: 5.0000 mg | Freq: Once | ORAL | Status: DC | PRN

## 2024-11-22 MED ORDER — NICOTINE 7 MG/24HR TD PT24
7.0000 mg | MEDICATED_PATCH | Freq: Every day | TRANSDERMAL | Status: DC
  Filled 2024-11-22 (×4): qty 1

## 2024-11-22 MED ORDER — LACTATED RINGERS IV SOLN: INTRAVENOUS | Status: DC

## 2024-11-22 MED ORDER — POLYETHYLENE GLYCOL 3350 17 G PO PACK
17.0000 g | PACK | Freq: Every day | ORAL | Status: DC | PRN
  Administered 2024-11-25: 18:00:00 17 g via ORAL
  Filled 2024-11-22: qty 1

## 2024-11-22 MED ORDER — FENTANYL CITRATE (PF) 250 MCG/5ML IJ SOLN
INTRAMUSCULAR | Status: DC | PRN
  Administered 2024-11-22: 50 ug via INTRAVENOUS

## 2024-11-22 MED ORDER — INSULIN GLARGINE 100 UNIT/ML ~~LOC~~ SOLN
5.0000 [IU] | Freq: Once | SUBCUTANEOUS | Status: DC
  Filled 2024-11-22: qty 0.05

## 2024-11-22 MED ORDER — SODIUM CHLORIDE 0.9% IV SOLUTION: Freq: Once | INTRAVENOUS | Status: AC

## 2024-11-22 MED ORDER — HEPARIN (PORCINE) 25000 UT/250ML-% IV SOLN
1050.0000 [IU]/h | INTRAVENOUS | Status: DC
  Filled 2024-11-22: qty 250

## 2024-11-22 MED FILL — Diclofenac Potassium Tab 50 MG: RECTAL | Qty: 1 | Status: AC

## 2024-11-22 MED FILL — Glucagon HCl (rDNA) Diagnostic For Inj 1 MG (Base Equiv): INTRAMUSCULAR | Qty: 2 | Status: AC

## 2024-11-22 NOTE — Progress Notes (Signed)
 OT Cancellation Note  Patient Details Name: Colin Keller MRN: [id number] DOB: 1956/08/25   Cancelled Treatment:    Reason Eval/Treat Not Completed: Patient at procedure or test/ unavailable.  OT will continue efforts in the acute setting.  Of note, increasing delusions and confusion overnight.    Franceen Erisman D Loralyn Rachel 11/22/2024, 9:05 AM 11/22/2024  RP, OTR/L  Acute Rehabilitation Services  Office:  [phone]

## 2024-11-22 NOTE — Progress Notes (Signed)
 Patient on floor, rapid was called to help  transfer patient to a progressive or ICU. Patient family at bedside, patient appears confused. Stated he was at home. Doctor at bedside. BP taken HR is fluctuating. Patient placed on telemetry monitor machine from crash cart

## 2024-11-22 NOTE — Progress Notes (Signed)
 Progress Note   Subjective   Patient states pain is slightly improved. WBC rising. Afebrile. Per primary team he has been a bit confused overnight and this AM.    Objective   Vital signs in last 24 hours: Temp:  [97.6 F (36.4 C)-97.8 F (36.6 C)] 97.8 F (36.6 C) (12/12 0734) Pulse Rate:  [56-66] 56 (12/12 0734) Resp:  [16-19] 16 (12/12 0734) BP: (121-139)/(59-72) 131/62 (12/12 0734) SpO2:  [96 %-99 %] 96 % (12/12 0734) Weight:  [67.5 kg] 67.5 kg (12/12 0643) Last BM Date : 11/19/24 General:    white male in NAD Abd: soft, mild tenderness Neurologic:  Alert and oriented Psych:  Cooperative. Normal mood and affect.  Intake/Output from previous day: 12/11 0701 - 12/12 0700 In: 592.5 [P.O.:200; I.V.:251.9; IV Piggyback:140.7] Out: 600 [Urine:600] Intake/Output this shift: No intake/output data recorded.  Lab Results: Recent Labs    11/20/24 0448 11/21/24 0424 11/22/24 0344  WBC 12.0* 17.9* 22.6*  HGB 8.4* 8.9* 8.4*  HCT 25.0* 25.6* 24.3*  PLT 212 244 258   BMET Recent Labs    11/20/24 0448 11/21/24 0424 11/22/24 0344  NA 138 134* 134*  K 4.1 3.7 3.7  CL 105 101 99  CO2 21* 23 25  GLUCOSE 173* 200* 154*  BUN 33* 34* 41*  CREATININE 2.80* 2.51* 2.95*  CALCIUM  8.2* 8.0* 8.4*   LFT Recent Labs    11/22/24 0344  PROT 5.9*  ALBUMIN  2.0*  AST 231*  ALT 198*  ALKPHOS 590*  BILITOT 4.2*   PT/INR No results for input(s): LABPROT, INR in the last 72 hours.  Studies/Results: NM Hepatobiliary Liver Func Result Date: 11/20/2024 EXAM: NM HEPATOBILLARY SCAN 11/20/2024 04:18:31 PM TECHNIQUE: RADIOPHARMACEUTICAL: 7.5 mCi Tc-5m mebrofenin Dynamic images of the abdomen and pelvis were obtained in the anterior projection for 90 minutes after intravenous administration of radiopharmaceutical. COMPARISON: MRI 11/19/2024. CLINICAL HISTORY: Cholecystitis. FINDINGS: There is uniform uptake within the liver. No activity is present within the common bile duct  over the entirety of the exam. The gallbladder cannot be assessed without filling of the common duct. Findings concerning for distal common bile duct obstruction in light of the prior day MRCP. IMPRESSION: 1. Findings concerning for distal common bile duct obstruction in light of the prior day MRCP. Electronically signed by: Norleen Boxer MD 11/20/2024 04:26 PM EST RP Workstation: [id number]       Assessment / Plan:    68 y/o male here with the following:  Elevated liver enzymes with leukocytosis - cholangitis vs. cholecystitis? 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 pancreatic mass vs. chronic pancreatitis - imaging is limited without contrast Pulmonary embolism - on heparin  drip  See full note from yesterday regarding details of his case. He is on antibiotics, WBC has risen but afebrile and hemodynamically stable. Mental status altered this AM, likely due to infection.  Discussed with him and his family that it is unclear if his lab abnormalities are secondary to a biliary stricture or possibly cholecystitis, it is hard to say on his imaging workup to date. MRCP does suggest a CBD stricture - concern for cholangitis. ERCP is scheduled for today to evaluate his bile duct, evaluate for stricture and decompress the biliary tree if needed. Currently scheduled with Dr. Abran for 1 PM. Heparin  drip was stopped at 7 AM. I have discussed ERCP with them, risks / benefits, they understand and wanted to proceed. Patient's mental status is a bit altered  this AM, will have family consent for the procedure. I tried calling his wife again this AM, no answer, will call back, although I am told she is on her way to the hospital.   Keep NPO, continue antibiotics, holding heparin  drip for ERCP. Further recommendations pending the results. If ERCP shows no stricture, he may warrant cholecystectomy or cholecystostomy tube, defer that decision to surgery.   Of note, he does also warrant  evaluation of his pancreas, ideally with EUS but that is not readily available this week. Can consider a contrast enhanced imaging study pending renal function. Will await ERCP first.   Call with questions.  Marcey Naval, MD [facility]

## 2024-11-22 NOTE — Assessment & Plan Note (Addendum)
 From prior hospitalization, pt very sensitive to insulin  dosing and goes hypoglycemic easily.  Blood glucoses elevated this morning since short acting held for n.p.o. - Increase LAI to 8u daily - Continue SSI very sensitive - CBG w/ meals  - Hold home metformin 

## 2024-11-22 NOTE — Progress Notes (Signed)
 PHARMACY - ANTICOAGULATION CONSULT NOTE  Pharmacy Consult for IV Heparin  Indication: pulmonary embolus  Allergies  Allergen Reactions   Amlodipine  Other (See Comments)    Leg swelling   Empagliflozin  Other (See Comments)    Euglycemic DKA   Amitriptyline  Other (See Comments)    Urinary retention   Lisinopril  Other (See Comments)    Hyperkalemia    Nortriptyline  Hcl Other (See Comments)    Vivid / bad dreams   Statins     Aches in Joints    Simvastatin  Other (See Comments)    Arthralgias    Patient Measurements: Height: 5' 10 (177.8 cm) IBW/kg (Calculated) : 73Height: 5' 10 (177.8 cm) Weight: 69.9 kg (154 lb) IBW/kg (Calculated) : 73 HEPARIN  DW (KG): 69.9  Vital Signs: Temp: 97.7 F (36.5 C) (12/12 0320) Temp Source: Oral (12/12 0320) BP: 138/72 (12/12 0320) Pulse Rate: 66 (12/12 0320)  Labs: Recent Labs    11/20/24 0448 11/20/24 1834 11/21/24 0424 11/21/24 1409 11/22/24 0344  HGB 8.4*  --  8.9*  --  8.4*  HCT 25.0*  --  25.6*  --  24.3*  PLT 212  --  244  --  258  APTT 102*   < > 94* 130* 87*  HEPARINUNFRC >1.10*  --  >1.10* >1.10* >1.10*  CREATININE 2.80*  --  2.51*  --  2.95*   < > = values in this interval not displayed.    Estimated Creatinine Clearance: 22.2 mL/min (A) (by C-G formula based on SCr of 2.95 mg/dL (H)).    Assessment: 68 yo male with recent diagnosis of pulmonary embolism (diagnosed 11/15/24) discharged on apixaban  (last dose 12/8 at 21:30 PM).  Returned to ED with abdominal pain and concern for cholecystitis vs choledocholithiasis with possible need for intervention. Pharmacy consulted to transition to IV heparin .   aPTT is supratherapeutic at 130 sec on infusion at 1200 units/hr. No issues with line or bleeding reported per RN. Given recent apixaban  use, will monitor anticoagulation using aPTT until aPTT and heparin  levels correlate.   12/12 AM update:  aPTT therapeutic this AM after rate decrease Heparin  off 0700 this AM for  procedure  Goal of Therapy:  Heparin  level 0.3-0.7 units/ml aPTT 66-102 seconds Monitor platelets by anticoagulation protocol: Yes   Plan:  Cont heparin  1050 units/hr F/U anti-coagulation plan after procedure today  [name] Mckusick, PharmD, BCPS Clinical Pharmacist Phone: [phone]

## 2024-11-22 NOTE — Progress Notes (Signed)
 Returned pt to [facility]. When pt returned, noticed that there was no telemetry in unit. Dr Lonnie notified in chat. Dr Delores came down to endoscopy unit to locate pt. Informed he was back on floor. Dr Brendolyn aware (covering for Dr Keneth). Dr Abran and Dr Leigh on their way to review pt.

## 2024-11-22 NOTE — Progress Notes (Addendum)
 Daily Progress Note Intern Pager: [phone]  Patient name: Colin Keller Medical record number: [id number] Date of birth: [birth year] Age: 68 y.o. Gender: male  Primary Care Provider: McDiarmid, Krystal BIRCH, MD Consultants: Darlyn Ogren Code Status: Full  Pt Overview and Major Events to Date:  12/9 admitted 12/12 ERCP   Medical Decision Making:  68yo m w/ hx of recent PE hospitalized for abdominal pain. Imaging thus far showing stricture and dilation of CBD along w/ pancreatic cysts w/ questionable malignancy. Concern for infection given increasing leukocytosis and new delusions overnight. ERCP w/ GI at 1pm 12/12 w/ possible cholecystectomy vs cholecystostomy tube to follow.  Assessment & Plan Abdominal pain Biliary stricture (HCC) Pain well controlled 12/12 AM. WBCs uptrending to 22. LFTs flat.  - Pain regimen: Changed to oxycodone  5mg  q4 prn, Dilaudid  0.5mg  q4 PRN - Bowel regimen w/ miralax bid and senna daily - Zofran  4mg  q6 prn - Continue IV zosyn  for concern for possible cholangitis -GI consulted, appreciate their recs -Gen Surg consulted, appreciate recs -AM CBC Delusions (HCC) Patient experiencing delusions overnight 12/12. Possibly 2/2 infection with possible contribution from narcotic pain medications.  -Neuro exam non focal -Normal respiration, no concern for hypercarbia Acute pulmonary embolism (HCC) Recently discharged on eliquis . Normal wob on room air. - Continue heparin  drip per pharm for PE in setting of future procedures - Hold eliquis  T2DM (type 2 diabetes mellitus) (HCC) From prior hospitalization, pt very sensitive to insulin  dosing and goes hypoglycemic easily.  Blood glucoses elevated this morning since short acting held for n.p.o. - Increase LAI to 8u daily - Continue SSI very sensitive - CBG w/ meals  - Hold home metformin  AKI (acute kidney injury) Cr uptrended vs prior, 2.95 from 2.51  - Resume maintenance IVF with 125 mL/h LR - holding home lasix ,  hydral, hydrochlorothiazide  - AM CMP, Mg Chronic health problem HTN/CAD/CHF: Continue home Coreg , diltiazem  BPH: Continue home tamsulosin  GERD: Continue home Protonix   FEN/GI: NPO for ercp PPx: iv heparin , held at 7am.  Dispo:Pending clinical improvement   Subjective:  Patient reports good pain control today. Has been NPO since midnight. He describes some delusions that he experienced overnight and still feels were real this AM.   Objective: Temp:  [97.6 F (36.4 C)-97.7 F (36.5 C)] 97.7 F (36.5 C) (12/12 0320) Pulse Rate:  [56-66] 56 (12/12 0629) Resp:  [16-19] 19 (12/12 0629) BP: (121-139)/(59-72) 134/59 (12/12 0629) SpO2:  [97 %-99 %] 98 % (12/12 0629) Weight:  [67.5 kg] 67.5 kg (12/12 9356) Physical Exam: General: Awake, alert, NAD. Communicates clearly. Cardio: RRR. Normal S1, S2. No murmur, rub, gallop. 2+ radial and dorsalis pedis pulses b/l w/ good capillary refill. Resp: CTA bilaterally. No wheezes, rales, or rhonchi. Normal work of breathing on room air Abdomen: soft, non-distended. Normoactive BS auscultated. Some tenderness to deep palpatoin in the RLQ, without guarding, rigidity, or rebound.  Neuro: Aox3, no focal deficits. Conversant w/ continued delusions, but not agitated. Moves all extremities.   Laboratory: Most recent CBC Lab Results  Component Value Date   WBC 22.6 (H) 11/22/2024   HGB 8.4 (L) 11/22/2024   HCT 24.3 (L) 11/22/2024   MCV 94.6 11/22/2024   PLT 258 11/22/2024   Most recent BMP    Latest Ref Rng & Units 11/22/2024    3:44 AM  BMP  Glucose 70 - 99 mg/dL 845   BUN 8 - 23 mg/dL 41   Creatinine 9.38 - 1.24 mg/dL 7.04   Sodium 864 -  145 mmol/L 134   Potassium 3.5 - 5.1 mmol/L 3.7   Chloride 98 - 111 mmol/L 99   CO2 22 - 32 mmol/L 25   Calcium  8.9 - 10.3 mg/dL 8.4    AST 768 ALT 801 Alk Phos 590 AG 10 Alb 2.0  Imaging/Diagnostic Tests: None  Manon Jester, DO 11/22/2024, 7:34 AM  PGY-1, [facility] FPTS  Intern pager: [phone], text pages welcome Secure chat group [facility] Teaching Service

## 2024-11-22 NOTE — Plan of Care (Signed)

## 2024-11-22 NOTE — Progress Notes (Signed)
 PHARMACY - ANTICOAGULATION CONSULT NOTE  Pharmacy Consult for IV Heparin  Indication: pulmonary embolus  Allergies  Allergen Reactions   Amlodipine  Other (See Comments)    Leg swelling   Empagliflozin  Other (See Comments)    Euglycemic DKA   Amitriptyline  Other (See Comments)    Urinary retention   Lisinopril  Other (See Comments)    Hyperkalemia    Nortriptyline  Hcl Other (See Comments)    Vivid / bad dreams   Statins     Aches in Joints    Simvastatin  Other (See Comments)    Arthralgias    Patient Measurements: Height: 5' 10 (177.8 cm) Weight: 67.5 kg (148 lb 13 oz) IBW/kg (Calculated) : 73Height: 5' 10 (177.8 cm) Weight: 69.9 kg (154 lb) IBW/kg (Calculated) : 73 HEPARIN  DW (KG): 69.9  Vital Signs: Temp: 97.3 F (36.3 C) (12/12 1448) Temp Source: Temporal (12/12 1448) BP: 133/59 (12/12 1520) Pulse Rate: 43 (12/12 1520)  Labs: Recent Labs    11/20/24 0448 11/20/24 1834 11/21/24 0424 11/21/24 1409 11/22/24 0344 11/22/24 1221 11/22/24 1505  HGB 8.4*  --  8.9*  --  8.4*  --  7.5*  HCT 25.0*  --  25.6*  --  24.3*  --  22.0*  PLT 212  --  244  --  258  --   --   APTT 102*   < > 94* 130* 87*  --   --   HEPARINUNFRC >1.10*  --  >1.10* >1.10* >1.10*  --   --   CREATININE 2.80*  --  2.51*  --  2.95* 3.27* 3.30*   < > = values in this interval not displayed.    Estimated Creatinine Clearance: 19.9 mL/min (A) (by C-G formula based on SCr of 3.3 mg/dL (H)).    Assessment: 68 yo male with recent diagnosis of pulmonary embolism (diagnosed 11/15/24) discharged on apixaban  (last dose 12/8 at 21:30 PM).  Returned to ED with abdominal pain and concern for cholecystitis vs choledocholithiasis with possible need for intervention. Pharmacy consulted to transition to IV heparin .   aPTT is supratherapeutic at 130 sec on infusion at 1200 units/hr. No issues with line or bleeding reported per RN. Given recent apixaban  use, will monitor anticoagulation using aPTT until aPTT and  heparin  levels correlate.   12/12 PM: Restart heparin  after procedure. No bolus.  Goal of Therapy:  Heparin  level 0.3-0.7 units/ml aPTT 66-102 seconds Monitor platelets by anticoagulation protocol: Yes   Plan:  Resume heparin  1050 units/hr 8 hour Heparin  level and aPTT  Daily Heparin  level and aPTT  Larraine Brazier, PharmD Clinical Pharmacist 11/22/2024  3:37 PM **Pharmacist phone directory can now be found on [email or web address] ([email or web address]).  Listed under [facility].

## 2024-11-22 NOTE — Significant Event (Addendum)
 Rapid Response Event Note   Reason for Call :  Bradycardic 40s, AMS  Initial Focused Assessment:  Patient just returned from ERCP, bradycardic and worsened AMS. Per report, bradycardic began during procedure, atropine given without response; pt returned to [facility]. Skin cool and dry, skin appears jaundiced. Lungs clear diminished, heart tones brady.   110/54 (71) HR 40 apical RR 17 O2 96% RA 95.6 rectal  CBG 230  Interventions/Plan of Care:  MD at bedside EKG CCM and cards consult, to bedside ICU tx order, transferred to 3M02 labs Family updated at bedside  Event Summary:  MD Notified: Baloch MD, L. Adolph PA  Call Time: 1645 Arrival Time: 1650 End Time: 1810  Tonna Chiquita POUR, RN

## 2024-11-22 NOTE — Anesthesia Preprocedure Evaluation (Signed)
 Anesthesia Evaluation  Patient identified by MRN, date of birth, ID band Patient awake    Reviewed: Allergy & Precautions, NPO status , Patient's Chart, lab work & pertinent test results, reviewed documented beta blocker date and time   History of Anesthesia Complications Negative for: history of anesthetic complications  Airway Mallampati: II  TM Distance: >3 FB     Dental  (+) Edentulous Lower, Edentulous Upper   Pulmonary shortness of breath, Current Smoker and Patient abstained from smoking.   breath sounds clear to auscultation       Cardiovascular hypertension, + angina with exertion + CAD, + CABG, + Peripheral Vascular Disease and +CHF  + dysrhythmias + pacemaker + Cardiac Defibrillator  Rhythm:Regular Rate:Normal  IMPRESSIONS     1. Left ventricular ejection fraction, by estimation, is 45 to 50%. Left  ventricular ejection fraction by 3D volume is 45 %. The left ventricle has  mildly decreased function. The left ventricle demonstrates global  hypokinesis. Left ventricular diastolic   parameters are consistent with Grade III diastolic dysfunction  (restrictive). Elevated left atrial pressure. The average left ventricular  global longitudinal strain is -15.3 %. The global longitudinal strain is  abnormal.   2. Right ventricular systolic function is normal. The right ventricular  size is moderately enlarged. Tricuspid regurgitation signal is inadequate  for assessing PA pressure.   3. Left atrial size was severely dilated.   4. The mitral valve is normal in structure. Trivial mitral valve  regurgitation. No evidence of mitral stenosis.   5. The aortic valve is normal in structure. Aortic valve regurgitation is  not visualized. No aortic stenosis is present.   6. The inferior vena cava is dilated in size with >50% respiratory  variability, suggesting right atrial pressure of 8 mmHg.   7. There is a long serpiginous  density next to the oriface of the IVC  into the RA. Review of this area on prior CTA shows no evidcene of  thrombus. This likely represents a prominent eustacion valve.      Neuro/Psych neg Seizures  Neuromuscular disease    GI/Hepatic ,GERD  ,,(+) neg Cirrhosis        Endo/Other  diabetes, Type 2    Renal/GU CRFRenal disease     Musculoskeletal   Abdominal   Peds  Hematology  (+) Blood dyscrasia, anemia   Anesthesia Other Findings   Reproductive/Obstetrics                              Anesthesia Physical Anesthesia Plan  ASA: 3  Anesthesia Plan: General   Post-op Pain Management:    Induction: Intravenous  PONV Risk Score and Plan: 1 and Ondansetron  and Propofol  infusion  Airway Management Planned: Oral ETT  Additional Equipment:   Intra-op Plan:   Post-operative Plan: Extubation in OR  Informed Consent: I have reviewed the patients History and Physical, chart, labs and discussed the procedure including the risks, benefits and alternatives for the proposed anesthesia with the patient or authorized representative who has indicated his/her understanding and acceptance.     Dental advisory given  Plan Discussed with: CRNA  Anesthesia Plan Comments:         Anesthesia Quick Evaluation

## 2024-11-22 NOTE — Consult Note (Signed)
 Cardiology Consultation   Patient ID: Colin Keller MRN: [id number]; DOB: December 12, 1956  Admit date: 11/19/2024 Date of Consult: 11/22/2024  PCP:  McDiarmid, Krystal BIRCH, MD   [facility] Providers Cardiologist:  Redell Shallow, MD  [facility] Cardiologist:  Deatrice Cage, MD        History of Present Illness:   Mr. Huot 68 year old male with hypertension, hyperlipidemia, type 2 diabetes mellitus, CAD s/p CABG x 3, PAD, recent diagnosis of PE in 11/2024, admitted with abdominal pain, leukocytosis, elevated liver enzymes with ongoing concern for cholecystitis/cholangitis.  Patient underwent endoscopy earlier this afternoon, and has been bradycardic since then.  Cardiology was consulted for bradycardia.  Talking to patient, his family, and other providers, it appears that patient has been confused since yesterday, unable to provide any meaningful history.  He denies any chest pain, or shortness of breath, complains of elbow pain, denies any abdominal pain at this time.  However, has had significant abdominal pain since his admission.  He is hypothermic requiring warming blanket at this time, maintaining blood pressure, but heart rate running in 30s and 40s.   Past Medical History:  Diagnosis Date   Acute diastolic heart failure (HCC) 08/18/2018   AKI (acute kidney injury) 10/04/2023   Atherosclerosis of both carotid arteries 08/18/2020   Carotid dopplers 08/2019 40-59% bilateral stenosis and right subclavian stenosis   Bilateral carotid artery stenosis 08/18/2020   Right Carotid: Velocities in the right ICA are consistent with a 60-79%                 stenosis. Non-hemodynamically significant plaque <50% noted in                 the CCA. The ECA appears >50% stenosed. Proximal subclavian                 artery dilatation with elevated and turbulent flow just past the                 narrowing, measuring 1.6 cm.   Left Carotid: Velocities in the left ICA are    CHF (congestive heart failure)  (HCC)    Chronic congestive heart failure (HCC) 10/04/2023   Transthoracic echocardiogram 10/08/2018: LVEF is approximately 50% with hypokinesis of the lateral (base/mid) and basal inferior walls T     Chronic left shoulder pain 03/18/2021   Chronic low back pain without sciatica 10/16/2008   H/o chronic LBP with h/o lumbar disc herniation and intermittent radicular pain  Chronic pain, on stable doses of Norco 10/325 TID. MRI in 2003 showing SMALL CENTRAL DISC HERNIATION AT L4-5.  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      CKD (chronic kidney disease), stage III (HCC)    Coronary artery disease    Coronary artery disease involving native heart with angina pectoris, unspecified vessel or lesion type 08/10/2023   COVID 10/04/2023   Diabetes mellitus    DKA (diabetic ketoacidosis) (HCC) 10/03/2023   Dyspnea    Fall 10/04/2023   GERD (gastroesophageal reflux disease)    Hx of CABG 08/23/2018   LIMA to LAD, RIMA to RCA, SVG to OM3, EVH via right thigh   Hyperlipidemia    Hypertension    Hypertension associated with diabetes (HCC) 12/22/2008   Qualifier: Diagnosis of  By: Edrick MD, Susan     Hypertensive nephropathy 08/18/2020   Insulin  dependent type 2 diabetes mellitus (HCC) 12/22/2020   Left ventricular dysfunction 08/18/2020   TTE (  [id number]): LVEF is approximately 50% with hypokinesis of the lateral (base/mid) and basal inferior walls The cavity size was normal.      Long term (current) use of antithrombotics/antiplatelets 09/21/2022   Planned 49-months DAPT for DES for PAD on 09/21/2022.  Planned 6 months therapy. End 03/23/23.   Peripheral artery disease 08/31/2021   [facility] Vascular Study lower extremities 08/30/21:            Today's ABI  Today's TBI   Previous ABIPrevious TBI  +-------+-----------+-----------+------------+------------+  Right  0.76       0.31       1.03                       +-------+-----------+-----------+------------+------------+  Left   0.63       0.20       0.95                      +-------+-----------+---------   Proteinuria 08/18/2020   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   LIMA to LAD, RIMA to RCA, SVG to OM3, EVH via right thigh   Sleeping difficulties 09/08/2019   Smoking greater than 20 pack years 08/22/2024   Subclavian artery stenosis, right 08/18/2020   Carotid dopplers 08/2019 40-59% bilateral stenosis and right subclavian stenosis   Tobacco abuse    Tobacco abuse disorder    Qualifier: Diagnosis of  By: Edrick MD, Devere      Past Surgical History:  Procedure Laterality Date   ABDOMINAL AORTOGRAM W/LOWER EXTREMITY N/A 09/21/2022   Procedure: ABDOMINAL AORTOGRAM W/LOWER EXTREMITY;  Surgeon: Darron Deatrice LABOR, MD;  Location: [facility] INVASIVE CV LAB;  Service: Cardiovascular;  Laterality: N/A;   CORONARY ARTERY BYPASS GRAFT N/A 08/23/2018   Procedure: CORONARY ARTERY BYPASS GRAFTING (CABG) x 3; -Left Internal Mammary Artery to Left Anterior Descending Artery, -Right Internal Mammary Artery to Right Coronary Artery, -Saphenous Vein Graft to Obtuse Marginal;  ENDOSCOPIC HARVEST GREATER SAPHENOUS VEIN  -Right Thigh;  Surgeon: Dusty Sudie DEL, MD;  Location: [facility] OR;  Service: Open Heart Surgery;  Laterality: N/A;   CORONARY PRESSURE/FFR STUDY N/A 08/20/2018   Procedure: INTRAVASCULAR PRESSURE WIRE/FFR STUDY;  Surgeon: Wonda Sharper, MD;  Location[facility]VAS[facility] CV LAB;  Service: Cardiovascular;  Laterality: N/A;   LEFT HEART CATH AND CORONARY ANGIOGRAPHY N/A 08/20/2018   Procedure: LEFT HEART CATH AND CORONARY ANGIOGRAPHY;  Surgeon: Wonda Sharper, MD;  Location: [facility] INVASIVE CV LAB;  Service: Cardiovascular;  Laterality: N/A;   PERIPHERAL VASCULAR ATHERECTOMY Left 09/21/2022   Procedure: PERIPHERAL VASCULAR ATHERECTOMY;  Surgeon: Darron Deatrice LABOR, MD;  Location: [facility] INVASIVE CV LAB;  Service: Cardiovascular;  Laterality: Left;  SFA   PERIPHERAL VASCULAR  BALLOON ANGIOPLASTY Left 09/21/2022   Procedure: PERIPHERAL VASCULAR BALLOON ANGIOPLASTY;  Surgeon: Darron Deatrice LABOR, MD;  Location: [facility] INVASIVE CV LAB;  Service: Cardiovascular;  Laterality: Left;  SFA   TEE WITHOUT CARDIOVERSION N/A 08/23/2018   Procedure: TRANSESOPHAGEAL ECHOCARDIOGRAM (TEE);  Surgeon: Dusty Sudie DEL, MD;  Location: [facility] OR;  Service: Open Heart Surgery;  Laterality: N/A;     Home Medications:  Prior to Admission medications  Medication Sig Start Date End Date Taking? Authorizing Provider  APIXABAN  (ELIQUIS ) VTE STARTER PACK (10MG  AND 5MG ) Take as directed on package: start with two-5mg  tablets twice daily for 7 days. On day 8, switch to one-5mg  tablet twice daily. 11/16/24  Yes Gherghe, Costin M, MD  aspirin  81 MG tablet Take 81 mg by mouth  daily.   Yes [provider]  carvedilol  (COREG ) 6.25 MG tablet Take 1 tablet (6.25 mg total) by mouth 2 (two) times daily with a meal. 11/17/24  Yes Elicia Hamlet, MD  cetirizine (ZYRTEC) 10 MG chewable tablet Chew 10 mg by mouth as needed for allergies.   Yes [provider]  diltiazem  (CARDIZEM  CD) 180 MG 24 hr capsule Take 180 mg by mouth daily. 10/29/24  Yes [provider]  Evolocumab  (REPATHA  SURECLICK) 140 MG/ML SOAJ Inject 140 mg into the skin every 14 (fourteen) days. 03/12/24  Yes Darron Deatrice LABOR, MD  furosemide  (LASIX ) 40 MG tablet Take 0.5 tablets (20 mg total) by mouth daily. 11/18/24  Yes Elicia Hamlet, MD  Glucagon  (BAQSIMI  TWO PACK) 3 MG/DOSE POWD Use as directed PRN low glucose 10/30/24  Yes McDiarmid, Krystal BIRCH, MD  hydrALAZINE  (APRESOLINE ) 50 MG tablet Take 1 tablet (50 mg total) by mouth 3 (three) times daily. 08/22/24 08/17/25 Yes McDiarmid, Krystal BIRCH, MD  hydrochlorothiazide  (HYDRODIURIL ) 12.5 MG tablet Take 1 tablet (12.5 mg total) by mouth daily. 10/30/24  Yes McDiarmid, Krystal BIRCH, MD  HYDROcodone -acetaminophen  (NORCO) 10-325 MG tablet TAKE 1 TABLET BY MOUTH TWICE DAILY AS NEEDED Patient taking  differently: Take 1 tablet by mouth in the morning and at bedtime. 11/06/24  Yes McDiarmid, Krystal BIRCH, MD  Insulin  Glargine (BASAGLAR  KWIKPEN) 100 UNIT/ML DIAL AND INJECT 20 UNITS UNDER THE SKIN DAILY. Patient taking differently: Inject 10-12 Units into the skin daily. 08/19/24  Yes McDiarmid, Krystal BIRCH, MD  insulin  lispro (HUMALOG ) 100 UNIT/ML injection Inject 0.03-0.05 mLs (3-5 Units total) into the skin 3 (three) times daily with meals. 3-4 units prior to breakfast, 4-5 units prior to evening meal 08/08/24 02/04/25 Yes McDiarmid, Krystal BIRCH, MD  metFORMIN  (GLUCOPHAGE ) 500 MG tablet Take 2 tablets (1,000 mg total) by mouth 2 (two) times daily with a meal. TAKE 2 TABLETS BY MOUTH TWICE DAILY WITH A MEAL 10/23/23 11/15/25 Yes McDiarmid, Krystal BIRCH, MD  pantoprazole  (PROTONIX ) 40 MG tablet Take 1 tablet (40 mg total) by mouth daily. 11/18/24  Yes Elicia Hamlet, MD  promethazine  (PHENERGAN ) 25 MG tablet Take 1 tablet (25 mg total) by mouth every 6 (six) hours as needed for nausea or vomiting. Patient taking differently: Take 12.5-25 mg by mouth every 6 (six) hours as needed for nausea or vomiting. 09/24/24  Yes McDiarmid, Krystal BIRCH, MD  tamsulosin  (FLOMAX ) 0.4 MG CAPS capsule Take 1 capsule (0.4 mg total) by mouth daily. 02/20/24  Yes McDiarmid, Krystal BIRCH, MD  apixaban  (ELIQUIS ) 5 MG TABS tablet Take 1 tablet (5 mg total) by mouth 2 (two) times daily. To begin after completion of Eliquis  (apixaban ) VTE starter pack Patient not taking: Reported on 11/19/2024 12/17/24   Gherghe, Costin M, MD    Inpatient Medications: Scheduled Meds:  aspirin  EC  81 mg Oral Daily   atropine       indomethacin  100 mg Rectal Once   insulin  aspart  0-6 Units Subcutaneous TID WC   insulin  glargine  5 Units Subcutaneous Once   [START ON 11/23/2024] insulin  glargine  8 Units Subcutaneous Daily   lidocaine   3 patch Transdermal Q24H   nicotine   7 mg Transdermal Daily   pantoprazole   40 mg Oral Daily   polyethylene glycol  17 g Oral Daily   senna  1  tablet Oral Daily   tamsulosin   0.4 mg Oral Daily   Continuous Infusions:  acetaminophen      heparin      lactated  ringers  125 mL/hr at 11/22/24 1654   piperacillin -tazobactam (ZOSYN )  IV 3.375 g (11/22/24 0129)   PRN Meds: alum & mag hydroxide-simeth, atropine, HYDROmorphone  (DILAUDID ) injection, melatonin, ondansetron  (ZOFRAN ) IV, oxyCODONE   Allergies:   Allergies[1]  Social History:   Social History   Socioeconomic History   Marital status: Married    Spouse name: Katheryn    Number of children: 2   Years of education: 10   Highest education level: 10th grade  Occupational History   Occupation: music therapist  Tobacco Use   Smoking status: Every Day    Current packs/day: 0.00    Average packs/day: 1 pack/day for 48.0 years (48.0 ttl pk-yrs)    Types: Cigarettes    Start date: 08/12/1970    Last attempt to quit: 08/12/2018    Years since quitting: 6.2   Smokeless tobacco: Never   Tobacco comments:    Quit post CABG in 2019 - previous 1ppd  Vaping Use   Vaping status: Never Used  Substance and Sexual Activity   Alcohol use: No    Alcohol/week: 0.0 standard drinks of alcohol   Drug use: No   Sexual activity: Yes    Birth control/protection: Post-menopausal  Other Topics Concern   Not on file  Social History Narrative   Lives with his wife in [place].    Wife Katheryn is also [facility] patient.   Works odd-jobs in holiday representative.    Previously used marijuana -quit 10/03. Previous 1ppd smoker- quit 2019.   Surrogate decision maker: Isaiahs Chancy (wife)   Patient has one daughter.   Patients son died of a drug overdose 26-Feb-2021.   Patient has 2 dogs and 2 cats.       Social Drivers of Health   Tobacco Use: High Risk (11/22/2024)   Patient History    Smoking Tobacco Use: Every Day    Smokeless Tobacco Use: Never    Passive Exposure: Not on file  Financial Resource Strain: Low Risk (08/07/2023)   Overall Financial Resource Strain (CARDIA)    Difficulty of Paying Living Expenses: Not  hard at all  Food Insecurity: No Food Insecurity (11/19/2024)   Epic    Worried About Programme Researcher, Broadcasting/film/video in the Last Year: Never true    Ran Out of Food in the Last Year: Never true  Transportation Needs: No Transportation Needs (11/19/2024)   Epic    Lack of Transportation (Medical): No    Lack of Transportation (Non-Medical): No  Physical Activity: Sufficiently Active (08/07/2023)   Exercise Vital Sign    Days of Exercise per Week: 5 days    Minutes of Exercise per Session: 30 min  Stress: No Stress Concern Present (08/07/2023)   [employer] of Occupational Health - Occupational Stress Questionnaire    Feeling of Stress : Not at all  Social Connections: Moderately Isolated (11/19/2024)   Social Connection and Isolation Panel    Frequency of Communication with Friends and Family: More than three times a week    Frequency of Social Gatherings with Friends and Family: Three times a week    Attends Religious Services: Never    Active Member of Clubs or Organizations: No    Attends Banker Meetings: Never    Marital Status: Married  Catering Manager Violence: Not At Risk (11/19/2024)   Epic    Fear of Current or Ex-Partner: No    Emotionally Abused: No    Physically Abused: No    Sexually Abused: No  Depression (PHQ2-9): Low Risk (  08/22/2024)   Depression (PHQ2-9)    PHQ-2 Score: 0  Alcohol Screen: Low Risk (08/07/2023)   Alcohol Screen    Last Alcohol Screening Score (AUDIT): 0  Housing: Low Risk (11/19/2024)   Epic    Unable to Pay for Housing in the Last Year: No    Number of Times Moved in the Last Year: 0    Homeless in the Last Year: No  Utilities: Not At Risk (11/19/2024)   Epic    Threatened with loss of utilities: No  Health Literacy: Adequate Health Literacy (08/07/2023)   B1300 Health Literacy    Frequency of need for help with medical instructions: Rarely    Family History:    Family History  Problem Relation Age of Onset   Heart disease Mother     Diabetes Mother    Heart failure Mother    Heart disease Father    Sudden death Brother    Drug abuse Son      ROS:  Please see the history of present illness.   All other ROS reviewed and negative.     Physical Exam/Data:   Vitals:   11/22/24 1648 11/22/24 1714 11/22/24 1715 11/22/24 1725  BP: (!) 110/54 (!) 109/54 117/62 (!) 113/53  Pulse: (!) 38 (!) 39 (!) 40 (!) 38  Resp: 20   17  Temp:      TempSrc:      SpO2: 100% 99%  96%  Weight:      Height:        Intake/Output Summary (Last 24 hours) at 11/22/2024 1730 Last data filed at 11/22/2024 1455 Gross per 24 hour  Intake 2238.99 ml  Output 600 ml  Net 1638.99 ml      11/22/2024    6:43 AM 11/17/2024    6:00 AM 11/15/2024    4:24 PM  Last 3 Weights  Weight (lbs) 148 lb 13 oz 148 lb 5.9 oz 154 lb  Weight (kg) 67.5 kg 67.3 kg 69.854 kg     Body mass index is 21.35 kg/m.  Physical Exam Vitals and nursing note reviewed.  Constitutional:      General: He is not in acute distress.    Appearance: He is ill-appearing and toxic-appearing.  Neck:     Vascular: No JVD.  Cardiovascular:     Rate and Rhythm: Regular rhythm. Bradycardia present.     Heart sounds: Normal heart sounds. No murmur heard. Pulmonary:     Effort: Pulmonary effort is normal.     Breath sounds: Normal breath sounds. No wheezing or rales.  Musculoskeletal:     Right lower leg: No edema.     Left lower leg: No edema.  Skin:    Coloration: Skin is jaundiced.      EKG:  The EKG was personally reviewed and demonstrates:   EKG 11/22/2024: Marked sinus bradycardia or junctional rhythm with PACs Cannot exclude old inferior infarct Significant change compared to previous EKGs on 11/15/2024 that showed sinus tachycardia  Telemetry:  Telemetry was personally reviewed and demonstrates:   Bedside telemetry shows bradycardia in 30s/40s  Relevant CV Studies: Echocardiogram 11/16/2024:  1. Left ventricular ejection fraction, by estimation, is 45  to 50%. Left  ventricular ejection fraction by 3D volume is 45 %. The left ventricle has  mildly decreased function. The left ventricle demonstrates global  hypokinesis. Left ventricular diastolic  parameters are consistent with Grade III diastolic dysfunction (restrictive). Elevated left atrial pressure. The average left ventricular global longitudinal strain is -  15.3 %. The global longitudinal strain is abnormal.   2. Right ventricular systolic function is normal. The right ventricular  size is moderately enlarged. Tricuspid regurgitation signal is inadequate  for assessing PA pressure.   3. Left atrial size was severely dilated.   4. The mitral valve is normal in structure. Trivial mitral valve  regurgitation. No evidence of mitral stenosis.   5. The aortic valve is normal in structure. Aortic valve regurgitation is  not visualized. No aortic stenosis is present.   6. The inferior vena cava is dilated in size with >50% respiratory  variability, suggesting right atrial pressure of 8 mmHg.   7. There is a long serpiginous density next to the oriface of the IVC  into the RA. Review of this area on prior CTA shows no evidcene of  thrombus. This likely represents a prominent eustacion valve.   CT abdomen with contrast 11/19/2024: 1. Gallbladder lumen is filled with high density material, likely vicarious excretion of intravenous contrast administered for chest CTA 11/15/2024. Gallbladder wall appears circumferentially thickened with small volume confluent edema in the right upper quadrant of the abdomen adjacent to the gallbladder. Imaging features raise the question of acute cholecystitis. Right upper quadrant ultrasound may prove helpful. 2. Intrahepatic biliary duct dilatation. Common bile duct not well demonstrated on this noncontrast exam. Correlation with liver function test recommended. 3. Soft tissue nodularity in the expected location of the left adrenal gland may reflect nodular  thickening, small lymph nodes, or vascular anatomy. This is not well evaluated given lack of intravenous contrast material. 4. Possible borderline lymphadenopathy in the left para-aortic space. 5.  Aortic Atherosclerosis (ICD10-I70.0).  CTA chest 11/15/2024: 1. Faint nonocclusive filling defect in the posterior basilar segmental artery of the left lower lobe, consistent with a small pulmonary embolism. No other definite emboli are present. 2. 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 3. Moderate left pleural effusion and small right pleural effusion. 4. Pulmonary nodules including a 5 mm left upper lobe nodule and an 8 mm right lower lobe nodule. For the 8 mm solid nodule, recommend non-contrast chest CT at 36 months; if high risk for malignancy, consider PET/CT or tissue sampling per Fleischner Society Guidelines.       Laboratory Data:  High Sensitivity Troponin:   Recent Labs  Lab 11/15/24 1708 11/15/24 1908  TROPONINIHS 95* 87*     Chemistry Recent Labs  Lab 11/17/24 0248 11/19/24 0917 11/20/24 0448 11/21/24 0424 11/22/24 0344 11/22/24 1221 11/22/24 1505  NA 140   < > 138 134* 134* 133* 136  K 3.5   < > 4.1 3.7 3.7 4.2 3.8  CL 110   < > 105 101 99 97* 99  CO2 23   < > 21* 23 25 22   --   GLUCOSE 65*   < > 173* 200* 154* 312* 299*  BUN 33*   < > 33* 34* 41* 50* 48*  CREATININE 2.46*   < > 2.80* 2.51* 2.95* 3.27* 3.30*  CALCIUM  8.3*   < > 8.2* 8.0* 8.4* 8.1*  --   MG 1.5*  --  1.7 1.8  --   --   --   GFRNONAA 27*   < > 24* 27* 22* 20*  --   ANIONGAP 7   < > 12 10 10 14   --    < > = values in this interval not displayed.    Recent Labs  Lab 11/21/24 0424 11/22/24 0344 11/22/24 1221  PROT 5.8* 5.9* 5.9*  ALBUMIN  2.0* 2.0* 2.0*  AST 289* 231* 154*  ALT 187* 198* 169*  ALKPHOS 540* 590* 582*  BILITOT 3.9* 4.2* 3.9*   Lipids No results for input(s): CHOL, TRIG, HDL, LABVLDL, LDLCALC, CHOLHDL in the last 168 hours.   Hematology Recent Labs  Lab 11/20/24 0448 11/21/24 0424 11/22/24 0344 11/22/24 1505  WBC 12.0* 17.9* 22.6*  --   RBC 2.58* 2.69* 2.57*  --   HGB 8.4* 8.9* 8.4* 7.5*  HCT 25.0* 25.6* 24.3* 22.0*  MCV 96.9 95.2 94.6  --   MCH 32.6 33.1 32.7  --   MCHC 33.6 34.8 34.6  --   RDW 13.0 12.8 13.2  --   PLT 212 244 258  --    Thyroid No results for input(s): TSH, FREET4 in the last 168 hours.  BNPNo results for input(s): BNP, PROBNP in the last 168 hours.  DDimer  Recent Labs  Lab 11/15/24 1749  DDIMER 2.39*     Radiology/Studies:  DG C-Arm 1-60 Min-No Report Result Date: 11/22/2024 Fluoroscopy was utilized by the requesting physician.  No radiographic interpretation.   DG C-Arm 1-60 Min-No Report Result Date: 11/22/2024 Fluoroscopy was utilized by the requesting physician.  No radiographic interpretation.   NM Hepatobiliary Liver Func Result Date: 11/20/2024 EXAM: NM HEPATOBILLARY SCAN 11/20/2024 04:18:31 PM TECHNIQUE: RADIOPHARMACEUTICAL: 7.5 mCi Tc-74m mebrofenin Dynamic images of the abdomen and pelvis were obtained in the anterior projection for 90 minutes after intravenous administration of radiopharmaceutical. COMPARISON: MRI 11/19/2024. CLINICAL HISTORY: Cholecystitis. FINDINGS: There is uniform uptake within the liver. No activity is present within the common bile duct over the entirety of the exam. The gallbladder cannot be assessed without filling of the common duct. Findings concerning for distal common bile duct obstruction in light of the prior day MRCP. IMPRESSION: 1. Findings concerning for distal common bile duct obstruction in light of the prior day MRCP. Electronically signed by: Norleen Boxer MD 11/20/2024 04:26 PM EST RP Workstation: [id number]   MR ABDOMEN MRCP WO CONTRAST Result Date: 11/19/2024 CLINICAL DATA:  Abdominal pain. Gallbladder wall thickening and biliary ductal dilatation on recent ultrasound. EXAM: MRI ABDOMEN WITHOUT CONTRAST  (INCLUDING  MRCP) TECHNIQUE: Multiplanar multisequence MR imaging of the abdomen was performed. Heavily T2-weighted images of the biliary and pancreatic ducts were obtained, and three-dimensional MRCP images were rendered by post processing. COMPARISON:  None Available. FINDINGS: Lower chest: No acute findings. Hepatobiliary:  No masses visualized on this unenhanced exam. The gallbladder is mildly distended and shows mild diffuse wall thickening. No definite gallstones are seen. Pericholecystic edema is seen but is similar to diffuse mesenteric, retroperitoneal, and body wall edema seen throughout the abdomen. Diffuse biliary ductal dilatation is seen, with common bile duct measuring 14 mm in diameter. No evidence of choledocholithiasis. Stricture of the distal common bile duct is seen within the pancreatic head. Pancreas: Evaluation is limited by lack of intravenous contrast and some motion artifact. Multiple tiny cystic foci are seen within the pancreatic head at the site of distal common bile duct stricture. This may be due to chronic pancreatitis, however, pancreatic mass cannot definitely be excluded. No No evidence of main pancreatic ductal dilatation. Spleen:  Within normal limits in size. Adrenals/Urinary tract: Unremarkable. No evidence of nephrolithiasis or hydronephrosis. Stomach/Bowel: No dilated bowel loops. Diffuse mesenteric edema and minimal perihepatic ascites. Vascular/Lymphatic: No pathologically enlarged lymph nodes identified. No evidence of abdominal aortic aneurysm. Other:  None. Musculoskeletal:  No suspicious bone lesions identified. IMPRESSION: Diffuse biliary  ductal dilatation due to distal common bile duct stricture. No evidence of choledocholithiasis. Multiple tiny cystic foci within the pancreatic head at the site of distal common bile duct stricture. This may be due to chronic pancreatitis, however, pancreatic mass cannot definitely be excluded on this unenhanced exam. Recommend further imaging  evaluation with pancreatic protocol abdomen CT without and with contrast Distended gallbladder with mild diffuse wall thickening, which is nonspecific. Differential diagnosis includes cholecystitis and other etiologies such as hypoalbuminemia, liver disease, or heart failure. Anasarca and minimal perihepatic ascites. Electronically Signed   By: Norleen DELENA Kil M.D.   On: 11/19/2024 17:48   MR 3D Recon At Scanner Result Date: 11/19/2024 CLINICAL DATA:  Abdominal pain. Gallbladder wall thickening and biliary ductal dilatation on recent ultrasound. EXAM: MRI ABDOMEN WITHOUT CONTRAST  (INCLUDING MRCP) TECHNIQUE: Multiplanar multisequence MR imaging of the abdomen was performed. Heavily T2-weighted images of the biliary and pancreatic ducts were obtained, and three-dimensional MRCP images were rendered by post processing. COMPARISON:  None Available. FINDINGS: Lower chest: No acute findings. Hepatobiliary:  No masses visualized on this unenhanced exam. The gallbladder is mildly distended and shows mild diffuse wall thickening. No definite gallstones are seen. Pericholecystic edema is seen but is similar to diffuse mesenteric, retroperitoneal, and body wall edema seen throughout the abdomen. Diffuse biliary ductal dilatation is seen, with common bile duct measuring 14 mm in diameter. No evidence of choledocholithiasis. Stricture of the distal common bile duct is seen within the pancreatic head. Pancreas: Evaluation is limited by lack of intravenous contrast and some motion artifact. Multiple tiny cystic foci are seen within the pancreatic head at the site of distal common bile duct stricture. This may be due to chronic pancreatitis, however, pancreatic mass cannot definitely be excluded. No No evidence of main pancreatic ductal dilatation. Spleen:  Within normal limits in size. Adrenals/Urinary tract: Unremarkable. No evidence of nephrolithiasis or hydronephrosis. Stomach/Bowel: No dilated bowel loops. Diffuse mesenteric  edema and minimal perihepatic ascites. Vascular/Lymphatic: No pathologically enlarged lymph nodes identified. No evidence of abdominal aortic aneurysm. Other:  None. Musculoskeletal:  No suspicious bone lesions identified. 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 No evidence of choledocholithiasis. Multiple tiny cystic foci within the pancreatic head at the site of distal common bile duct stricture. This may be due to chronic pancreatitis, however, pancreatic mass cannot definitely be excluded on this unenhanced exam. Recommend further imaging evaluation with pancreatic protocol abdomen CT without and with contrast Distended gallbladder with mild diffuse wall thickening, which is nonspecific. Differential diagnosis includes cholecystitis and other etiologies such as hypoalbuminemia, liver disease, or heart failure. Anasarca and minimal perihepatic ascites. Electronically Signed   By: Norleen DELENA Kil M.D.   On: 11/19/2024 17:48   US  Abdomen Limited RUQ (LIVER/GB) Result Date: 11/19/2024 EXAM: Right Upper Quadrant Abdominal Ultrasound 11/19/2024 11:48:11 AM TECHNIQUE: Real-time ultrasonography of the right upper quadrant of the abdomen was performed. COMPARISON: CT of the abdomen and pelvis dated 11/19/2024. CLINICAL HISTORY: Cholecystitis. FINDINGS: LIVER: The liver demonstrates normal echogenicity. No intrahepatic biliary ductal dilatation. No evidence of mass. BILIARY SYSTEM: Gallbladder wall thickness measures 7 mm. The patient is not focally tender over the gallbladder. No pericholecystic fluid. No cholelithiasis. The common bile duct is abnormally dilated, measuring 13.5 mm. No intrahepatic biliary ductal dilatation. OTHER: No right upper quadrant ascites. IMPRESSION: 1. Gallbladder wall thickening (7 mm) without focal tenderness. 2. Dilated common bile duct (13.5 mm) without intrahepatic biliary ductal dilatation. Electronically signed by: Evalene Coho MD  11/19/2024 12:10 PM EST RP Workstation: [id number]   CT ABDOMEN PELVIS WO CONTRAST Result Date: 11/19/2024 CLINICAL DATA:  Abdominal pain. EXAM: CT ABDOMEN AND PELVIS WITHOUT CONTRAST TECHNIQUE: Multidetector CT imaging of the abdomen and pelvis was performed following the standard protocol without IV contrast. RADIATION DOSE REDUCTION: This exam was performed according to the departmental dose-optimization program which includes automated exposure control, adjustment of the mA and/or kV according to patient size and/or use of iterative reconstruction technique. COMPARISON:  06/20/2013 FINDINGS: Lower chest: Dependent atelectasis. Hepatobiliary: No suspicious focal abnormality in the liver on this study without intravenous contrast. Gallbladder lumen is filled with high density material. This is a new finding since chest CTA 11/15/2024 and likely reflects vicarious excretion of intravenous contrast administered for that study. Gallbladder wall appears circumferentially thickened. Intrahepatic biliary duct dilatation evident. Common bile duct not well demonstrated on this noncontrast exam. Pancreas: No focal mass lesion. No dilatation of the main duct. No intraparenchymal cyst. No peripancreatic edema. Spleen: No splenomegaly. No suspicious focal mass lesion. Adrenals/Urinary Tract: No right adrenal nodule or mass. Soft tissue nodularity in the expected location of the left adrenal gland may reflect nodular thickening, small lymph nodes, or vascular anatomy. This is not well evaluated given lack of intravenous contrast material. Both kidneys are heterogeneous in appearance. Probable punctate stones bilaterally. No hydronephrosis. No hydroureter. The urinary bladder appears normal for the degree of distention. Stomach/Bowel: Stomach is distended with fluid. Duodenum is normally positioned as is the ligament of Treitz. No small bowel wall thickening. No small bowel dilatation. The terminal ileum is normal. The  appendix is not well visualized, but there is no edema or inflammation in the region of the cecal tip to suggest appendicitis. No gross colonic mass. No colonic wall thickening. Vascular/Lymphatic: There is advanced atherosclerotic calcification of the abdominal aorta without aneurysm. Possible borderline lymphadenopathy in the left para-aortic space. No pelvic sidewall lymphadenopathy. Reproductive: The prostate gland and seminal vesicles are unremarkable. Other: Small volume confluent edema is seen in the right upper quadrant of the abdomen adjacent to the gallbladder. Musculoskeletal: No worrisome lytic or sclerotic osseous abnormality. IMPRESSION: 1. Gallbladder lumen is filled with high density material, likely vicarious excretion of intravenous contrast administered for chest CTA 11/15/2024. Gallbladder wall appears circumferentially thickened with small volume confluent edema in the right upper quadrant of the abdomen adjacent to the gallbladder. Imaging features raise the question of acute cholecystitis. Right upper quadrant ultrasound may prove helpful. 2. Intrahepatic biliary duct dilatation. Common bile duct not well demonstrated on this noncontrast exam. Correlation with liver function test recommended. 3. Soft tissue nodularity in the expected location of the left adrenal gland may reflect nodular thickening, small lymph nodes, or vascular anatomy. This is not well evaluated given lack of intravenous contrast material. 4. Possible borderline lymphadenopathy in the left para-aortic space. 5.  Aortic Atherosclerosis (ICD10-I70.0). Electronically Signed   By: Camellia Candle M.D.   On: 11/19/2024 10:44     Assessment and Plan:   68 year old male with hypertension, hyperlipidemia, type 2 diabetes mellitus, CAD s/p CABG x 3, PAD, recent diagnosis of PE in 11/2024, admitted with abdominal pain, leukocytosis, elevated liver enzymes with ongoing concern for cholecystitis/cholangitis.  Patient underwent  endoscopy earlier this afternoon, and has been bradycardic since then.  Cardiology was consulted for bradycardia.  Bradycardia: Profound junctional/sinus bradycardia in 30s and 40s, maintaining maps. Patient is hypothermic, has leukocytosis. I reckon his bradycardia is secondary to sepsis. I do not recommend temporary pacemaker in the  setting of suspected sepsis/bacteremia. He was no immediate indication for any cardiac procedures at this time. Supportive care as per ICU team.           [facility] will sign off. Please call us  back in case of any question.  For questions or updates, please contact  [facility] Please consult [email or web address] for contact info under    Signed, Newman JINNY Lawrence, MD  11/22/2024 5:30 PM     [1]  Allergies Allergen Reactions   Amlodipine  Other (See Comments)    Leg swelling   Empagliflozin  Other (See Comments)    Euglycemic DKA   Amitriptyline  Other (See Comments)    Urinary retention   Lisinopril  Other (See Comments)    Hyperkalemia    Nortriptyline  Hcl Other (See Comments)    Vivid / bad dreams   Statins     Aches in Joints    Simvastatin  Other (See Comments)    Arthralgias

## 2024-11-22 NOTE — Assessment & Plan Note (Addendum)
 Patient experiencing delusions overnight 12/12. Possibly 2/2 infection with possible contribution from narcotic pain medications.  -Neuro exam non focal -Normal respiration, no concern for hypercarbia

## 2024-11-22 NOTE — Progress Notes (Signed)
 Patient transferred to ICU bedside shift report given to [facility] . Notes from doctor/ PACU shared w nurse. Patient temp low, bear hugger was applied as well as the warm blankets placed on him from 5 C. Tele was placed on patient. Patient remained brady with HR 39-45. Patient face was more jaundice and yellow per family, night shift RN, and doctor. Patient is still not at baseline with his mentation. Family went with patient to ICU, left in waiting room. IV fluid was started before leaving 5 C. Patient will get heparin  without a bolus. ICU nurse aware.

## 2024-11-22 NOTE — H&P (View-Only) (Signed)
 Progress Note   Subjective   Patient states pain is slightly improved. WBC rising. Afebrile. Per primary team he has been a bit confused overnight and this AM.    Objective   Vital signs in last 24 hours: Temp:  [97.6 F (36.4 C)-97.8 F (36.6 C)] 97.8 F (36.6 C) (12/12 0734) Pulse Rate:  [56-66] 56 (12/12 0734) Resp:  [16-19] 16 (12/12 0734) BP: (121-139)/(59-72) 131/62 (12/12 0734) SpO2:  [96 %-99 %] 96 % (12/12 0734) Weight:  [67.5 kg] 67.5 kg (12/12 0643) Last BM Date : 11/19/24 General:    white male in NAD Abd: soft, mild tenderness Neurologic:  Alert and oriented Psych:  Cooperative. Normal mood and affect.  Intake/Output from previous day: 12/11 0701 - 12/12 0700 In: 592.5 [P.O.:200; I.V.:251.9; IV Piggyback:140.7] Out: 600 [Urine:600] Intake/Output this shift: No intake/output data recorded.  Lab Results: Recent Labs    11/20/24 0448 11/21/24 0424 11/22/24 0344  WBC 12.0* 17.9* 22.6*  HGB 8.4* 8.9* 8.4*  HCT 25.0* 25.6* 24.3*  PLT 212 244 258   BMET Recent Labs    11/20/24 0448 11/21/24 0424 11/22/24 0344  NA 138 134* 134*  K 4.1 3.7 3.7  CL 105 101 99  CO2 21* 23 25  GLUCOSE 173* 200* 154*  BUN 33* 34* 41*  CREATININE 2.80* 2.51* 2.95*  CALCIUM  8.2* 8.0* 8.4*   LFT Recent Labs    11/22/24 0344  PROT 5.9*  ALBUMIN  2.0*  AST 231*  ALT 198*  ALKPHOS 590*  BILITOT 4.2*   PT/INR No results for input(s): LABPROT, INR in the last 72 hours.  Studies/Results: NM Hepatobiliary Liver Func Result Date: 11/20/2024 EXAM: NM HEPATOBILLARY SCAN 11/20/2024 04:18:31 PM TECHNIQUE: RADIOPHARMACEUTICAL: 7.5 mCi Tc-5m mebrofenin Dynamic images of the abdomen and pelvis were obtained in the anterior projection for 90 minutes after intravenous administration of radiopharmaceutical. COMPARISON: MRI 11/19/2024. CLINICAL HISTORY: Cholecystitis. FINDINGS: There is uniform uptake within the liver. No activity is present within the common bile duct  over the entirety of the exam. The gallbladder cannot be assessed without filling of the common duct. Findings concerning for distal common bile duct obstruction in light of the prior day MRCP. IMPRESSION: 1. Findings concerning for distal common bile duct obstruction in light of the prior day MRCP. Electronically signed by: Norleen Boxer MD 11/20/2024 04:26 PM EST RP Workstation: [id number]       Assessment / Plan:    68 y/o male here with the following:  Elevated liver enzymes with leukocytosis - cholangitis vs. cholecystitis? 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 pancreatic mass vs. chronic pancreatitis - imaging is limited without contrast Pulmonary embolism - on heparin  drip  See full note from yesterday regarding details of his case. He is on antibiotics, WBC has risen but afebrile and hemodynamically stable. Mental status altered this AM, likely due to infection.  Discussed with him and his family that it is unclear if his lab abnormalities are secondary to a biliary stricture or possibly cholecystitis, it is hard to say on his imaging workup to date. MRCP does suggest a CBD stricture - concern for cholangitis. ERCP is scheduled for today to evaluate his bile duct, evaluate for stricture and decompress the biliary tree if needed. Currently scheduled with Dr. Abran for 1 PM. Heparin  drip was stopped at 7 AM. I have discussed ERCP with them, risks / benefits, they understand and wanted to proceed. Patient's mental status is a bit altered  this AM, will have family consent for the procedure. I tried calling his wife again this AM, no answer, will call back, although I am told she is on her way to the hospital.   Keep NPO, continue antibiotics, holding heparin  drip for ERCP. Further recommendations pending the results. If ERCP shows no stricture, he may warrant cholecystectomy or cholecystostomy tube, defer that decision to surgery.   Of note, he does also warrant  evaluation of his pancreas, ideally with EUS but that is not readily available this week. Can consider a contrast enhanced imaging study pending renal function. Will await ERCP first.   Call with questions.  Marcey Naval, MD [facility]

## 2024-11-22 NOTE — Assessment & Plan Note (Addendum)
 HTN/CAD/CHF: Continue home Coreg , diltiazem  BPH: Continue home tamsulosin  GERD: Continue home Protonix 

## 2024-11-22 NOTE — Interval H&P Note (Signed)
 History and Physical Interval Note:  11/22/2024 1:04 PM  Colin Keller  has presented today for surgery, with the diagnosis of biliary obstruction.  I have reviewed his inpatient records and discussed his case with the general GI team.  Dr. Hassan rounding note today also reviewed.  As he was confused earlier today, the various methods of treatment have been discussed with the patient and family (his wife who provided informed consent). After consideration of risks, benefits and other options for treatment, the patient has consented to  Procedures: ERCP, WITH INTERVENTION IF INDICATED (N/A) as a surgical intervention.  In addition, in the preprocedure admissions area, the patient's history has been reviewed, patient examined, no change in status, stable for surgery.  I have reviewed the patient's chart and labs.  Questions were answered to the patient's satisfaction.     Norleen Kiang

## 2024-11-22 NOTE — Care Management Important Message (Signed)
 Important Message  Patient Details  Name: Colin Keller MRN: [id number] Date of Birth: 02-Mar-1956   Important Message Given:  Yes - Medicare IM     Claretta Deed 11/22/2024, 2:46 PM

## 2024-11-22 NOTE — Progress Notes (Signed)
 Pt cared for in endoscopy recovery. Initially unresponsive post-procedure, maintaining Sp02 on room air, but with long periods of apnea. HR stable in 40s sinus bradycardia. BP maintained. Dr Keneth called to review pt. Discussed need for narcan, but pt eventually responsive to pain, and later alert without needing. EKG and Istat done and reviewed by Dr Keneth. CBG 299, 8 units of Novolog  ordered and same administered 1519hrs. Repeat CBG post insulin  286. Fluctuating orientation in recovery. At times pt was oriented to person, but sometimes giving his daughter's name as his own. Able to state birthday, but disoriented to time/place/situation. Noted confusion pre-procedure. Slightly agitated, but easily redirected. Dr Keneth reviewed pt before transfer to floor, and spoke with Dr on [facility] to update. Handed over to [place] [facility] and she was okay to accept pt.

## 2024-11-22 NOTE — Transfer of Care (Signed)
 Immediate Anesthesia Transfer of Care Note  Patient: Colin Keller  Procedure(s) Performed: ERCP, WITH INTERVENTION IF INDICATED  Patient Location: PACU  Anesthesia Type:General  Level of Consciousness: drowsy  Airway & Oxygen Therapy: Patient Spontanous Breathing  Post-op Assessment: Report given to RN  Post vital signs: Reviewed and stable  Last Vitals:  Vitals Value Taken Time  BP 124/57 11/22/24 14:48  Temp 36.3 C 11/22/24 14:48  Pulse 45 11/22/24 14:54  Resp 8 11/22/24 14:54  SpO2 100 % 11/22/24 14:54  Vitals shown include unfiled device data.  Last Pain:  Vitals:   11/22/24 1448  TempSrc: Temporal  PainSc: Asleep      Patients Stated Pain Goal: 0 (11/22/24 0810)  Complications: No notable events documented.

## 2024-11-22 NOTE — Assessment & Plan Note (Addendum)
 Pain well controlled 12/12 AM. WBCs uptrending to 22. LFTs flat.  - Pain regimen: Changed to oxycodone  5mg  q4 prn, Dilaudid  0.5mg  q4 PRN - Bowel regimen w/ miralax bid and senna daily - Zofran  4mg  q6 prn - Continue IV zosyn  for concern for possible cholangitis -GI consulted, appreciate their recs -Gen Surg consulted, appreciate recs -AM CBC

## 2024-11-22 NOTE — Assessment & Plan Note (Addendum)
 Cr uptrended vs prior, 2.95 from 2.51  - Resume maintenance IVF with 125 mL/h LR - holding home lasix , hydral, hydrochlorothiazide  - AM CMP, Mg

## 2024-11-22 NOTE — Plan of Care (Signed)
 Informed by PACU that patient was transferred back to [facility] floor despite active orders for progressive level of care. Evaluated patient w/ Dr Lonnie.   Patient w/ AMS and persistent bradycardia in the high 30 and low 40s w/ otherwise stable vitals on the floor after transfer out of PACU. Pt responsive, consistently arousable and intermittently conversational but oriented only to self.   Informed by charge nurse that no progressive beds were available. Rapid called for expedited telemetry, PCCM page for bradycardia w/ AMS. PA Adolph and rapid nurse Chiquita quickly present at bedside. Initiated transfer to MICU. Trop, BMP, Mg drawn by phlebotomy.   EKG showed sinus bradycardia w/ few ectopic beats w/o evidence of heart block.   Pt remained stable in the same condition throughout this time.

## 2024-11-22 NOTE — Consult Note (Signed)
 NAME:  Colin Keller, MRN:  [id number], DOB:  [birth year], LOS: 3 ADMISSION DATE:  11/19/2024, CONSULTATION DATE:  11/22/24 REFERRING MD:  Delores CHIEF COMPLAINT:  Hypotension, heart block   History of Present Illness:  Colin Keller is a 68 y.o. male who has a PMH as outlined below.  He had recent admission to [facility] 12/5 through 12/7 for left lower lobe PE.  He was discharged home with Eliquis  x 4 months.  He presented back to [facility] ED on 12/9 with abdominal pain, right upper quadrant for the past few weeks but progressively worse.  Apparently had this pain during his previous admission but chest pain was worse.  He was unable to sleep once he returned home therefore he came back to the ED.  Workup revealed a new elevation in LFTs as well as CTA and RUQ ultrasound that showed CBD dilatation and gallbladder wall thickening but no intrahepatic biliary ductal dilatation.  General surgery was consulted and MRCP was ordered as well as empiric antibiotics.  MRCP 12/9 demonstrated diffuse biliary ductal dilatation and a distal CBD stricture without evidence of choledocholithiasis.  HIDA scan 12/10 was then obtained to rule out cholecystitis but this was nondiagnostic.  GI waiting on the case and an ERCP was ultimately recommended.  On 12/12, he was taken for ERCP with biliary sphincterotomy and common bile duct stent placement.  Postprocedure, he was initially unresponsive with sinus bradycardia versus junctional rhythm in the 40s and borderline blood pressure.  Mental status gradually improved to the point he was oriented to person but remains slightly confused and disoriented to time/place/situation.  Given his bradycardia and borderline hypotension, PCCM and cardiology were both called in consultation.  Pertinent  Medical History:  has Hyperlipidemia associated with type 2 diabetes mellitus (HCC); Hypertension associated with diabetes (HCC); Chronic low back pain without sciatica; DM (diabetes  mellitus), type 2 with peripheral vascular complications (HCC); CKD stage G3b/A3, GFR 30-44 and albumin  creatinine ratio >300 mg/g (HCC); Pulmonary nodule less than 1 cm in diameter with moderate to high risk for malignant neoplasm; Diabetic retinopathy (HCC); Encounter for chronic pain management; HFrEF (heart failure with reduced ejection fraction) (HCC); PAD (peripheral artery disease); Healthcare maintenance; GERD without esophagitis; BPH (benign prostatic hyperplasia); T2DM (type 2 diabetes mellitus) (HCC); Encounter for long-term (current) use of insulin  (HCC); Bilateral carotid artery disease; Aneurysm of right subclavian artery; Extremity atherosclerosis with intermittent claudication; Heavy smoker (more than 20 cigarettes per day); Paresthesia of left foot; Severe Lumbar spinal stenosis; Normocytic anemia; Subclavian artery stenosis, right; Statin myopathy; 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   Significant Hospital Events: Including procedures, antibiotic start and stop dates in addition to other pertinent events   12/9 admit 12/9 MRCP with no choledocholithiasis, distended gallbladder with mild diffuse wall thickening, anasarca. 12/10 HIDA with no CBD activity, findings suggestive of distal CBD obstruction. 12/12 ERCP with biliary sphincterotomy and common bile duct stent placement 12/12 PCCM, cards consult 2/2 junctional rhythm and hypotension post procedure  Interim History / Subjective:  Drowsy, but wakes to voice. Oriented to self, disoriented to place. Not  having pain. Denies chest pain, shortness of breath, or lightheadedness.   Objective:  Blood pressure (!) 110/54, pulse (!) 38, temperature (!) 97.1 F (36.2 C), temperature source Temporal, resp. rate 20, height 5' 10 (1.778 m), weight 67.5 kg, SpO2 100%.        Intake/Output Summary (Last 24 hours) at 11/22/2024 1712 Last data filed at 11/22/2024 1455 Gross per 24 hour  Intake 2238.99 ml  Output 600 ml  Net 1638.99 ml   Filed Weights   11/22/24 0643  Weight: 67.5 kg     Physical Exam: General: older male, acutely ill appearing  Neuro: drowsy, oriented to self, disoriented to place, situation, non focal exam  HEENT: icteric sclera, mucous membranes dry, ncat Cardiovascular: s1s2, bradycardia, no edema  Lungs: CTAB, room air, resp even and unlabored  Abdomen: rounded, soft, grimace to palpation  Skin: jaundiced GU: na  Assessment & Plan:   Symptomatic bradycardia Likely multifactorial in setting of sepsis. Patient hypothermic to 95 rectal on exam. +/- anesthesia side effect.  EKG with mixed sinus or junctional bradycardia. MAP 70. Atropine tried by anesthesia with no effect.  - repeat BMP, mag, trop now  - cardiology consult, appreciate rec - f/u echo cardiogram  - dc coreg , diltiazem   - warm blankets - increase core temp  - treat underlying cholangitis/cholecystitis/sepsis - transfer to ICU for closer monitoring   Altered mental status  Reportedly worsening over the past few days; multifactorial in setting of uremia, sepsis, bradycardia - dc all pain meds for the moment - can add them back slowly PRN over next 24 hours until he improves hemodynamically and mental status  - check ammonia  - treat underlying conditions  - limit sedating medications  - neuro checks  - delirium precautions   Biliary obstruction - s/p ERCP with biliary sphincterotomy and common bile duct stent placement (12/12, Dr. Abran). Elevated liver enzymes  Concern for cholangitis, ?  Pancreatic mass. - Postop care per GI. - GI and surgery both following, appreciate the assistance. - Might need cholecystectomy versus percutaneous cholecystostomy tube in the next few days. - Continue empiric Zosyn . - Continue fluids. - Trend LFT's. - Cautious pain control.  Acute pulmonary embolism  - hold heparin  now in setting of ABLA getting transfusion  - con't pulse oximetry monitoring   Acute kidney injury  - hold hydrochlorothiazide , lasix   - trend bmp, mag, phos - replete elytes - strict I&O - Avoid nephrotoxic agents, renally dose medications - ensure adequate renal perfusion   Anemia - 1g drop in Hgb this PM since AM labs. - type and screen  - 1 U PRBC now  - trend  - stop heparin    Type 2 diabetes  - SSI, Lantus . - Hold PTA metformin .  Labs   CBC: Recent Labs  Lab 11/17/24 0248 11/19/24 0917 11/19/24 1004 11/20/24 0448 11/21/24 0424 11/22/24 0344 11/22/24 1505  WBC 10.0 12.1*  --  12.0* 17.9* 22.6*  --   HGB 8.1* 8.7* 8.5* 8.4* 8.9* 8.4* 7.5*  HCT 24.2* 26.4* 25.0* 25.0* 25.6* 24.3* 22.0*  MCV 96.4 97.4  --  96.9 95.2 94.6  --   PLT 251 261  --  212 244 258  --     Basic Metabolic Panel: Recent Labs  Lab 11/17/24 0248 11/19/24 0917 11/19/24 1004 11/20/24 0448 11/21/24 0424 11/22/24 0344 11/22/24 1221 11/22/24 1505  NA 140 134*   < > 138 134* 134* 133* 136  K 3.5 3.9   < > 4.1 3.7 3.7 4.2 3.8  CL 110 102  --  105 101 99  97* 99  CO2 23 21*  --  21* 23 25 22   --   GLUCOSE 65* 357*  --  173* 200* 154* 312* 299*  BUN 33* 40*  --  33* 34* 41* 50* 48*  CREATININE 2.46* 3.02*  --  2.80* 2.51* 2.95* 3.27* 3.30*  CALCIUM  8.3* 8.4*  --  8.2* 8.0* 8.4* 8.1*  --   MG 1.5*  --   --  1.7 1.8  --   --   --    < > = values in this interval not displayed.   GFR: Estimated Creatinine Clearance: 19.9 mL/min (A) (by C-G formula based on SCr of 3.3 mg/dL (H)). Recent Labs  Lab 11/19/24 0917 11/20/24 0448 11/21/24 0424 11/22/24 0344  WBC 12.1*  12.0* 17.9* 22.6*    Liver Function Tests: Recent Labs  Lab 11/19/24 0917 11/20/24 0448 11/21/24 0424 11/22/24 0344 11/22/24 1221  AST 51* 137* 289* 231* 154*  ALT 35 85* 187* 198* 169*  ALKPHOS 274* 320* 540* 590* 582*  BILITOT 1.1 3.0* 3.9* 4.2* 3.9*  PROT 6.1* 5.6* 5.8* 5.9* 5.9*  ALBUMIN  2.5* 2.1* 2.0* 2.0* 2.0*   Recent Labs  Lab 11/19/24 0917  LIPASE 28   No results for input(s): AMMONIA in the last 168 hours.  ABG    Component Value Date/Time   PHART 7.307 (L) 08/23/2018 1726   PCO2ART 38.1 08/23/2018 1726   PO2ART 89.0 08/23/2018 1726   HCO3 23.2 11/19/2024 1004   TCO2 22 11/22/2024 1505   ACIDBASEDEF 1.0 11/19/2024 1004   O2SAT 79 11/19/2024 1004     Coagulation Profile: No results for input(s): INR, PROTIME in the last 168 hours.  Cardiac Enzymes: No results for input(s): CKTOTAL, CKMB, CKMBINDEX, TROPONINI in the last 168 hours.  HbA1C: HbA1c, POC (controlled diabetic range)  Date/Time Value Ref Range Status  08/22/2024 09:44 AM 9.5 (A) 0.0 - 7.0 % Final  02/20/2024 10:15 AM 10.1 (A) 0.0 - 7.0 % Final   Hgb A1c MFr Bld  Date/Time Value Ref Range Status  11/15/2024 11:52 PM 9.6 (H) 4.8 - 5.6 % Final    Comment:    (NOTE) Diagnosis of Diabetes The following HbA1c ranges recommended by the American Diabetes Association (ADA) may be used as an aid in the diagnosis of diabetes mellitus.  Hemoglobin             Suggested A1C NGSP%              Diagnosis  <5.7                   Non Diabetic  5.7-6.4                Pre-Diabetic  >6.4                   Diabetic  <7.0                   Glycemic control for                       adults with diabetes.      CBG: Recent Labs  Lab 11/21/24 1949 11/22/24 0325 11/22/24 0803 11/22/24 1132 11/22/24 1552  GLUCAP 157* 160* 247* 316* 286*    Review of Systems:   As above  Past Medical History:  He,  has a past medical history of Acute diastolic heart failure (HCC)  (08/18/2018), AKI (acute kidney injury) (10/04/2023), Atherosclerosis of  both carotid arteries (08/18/2020), Bilateral carotid artery stenosis (08/18/2020), CHF (congestive heart failure) (HCC), Chronic congestive heart failure (HCC) (10/04/2023), Chronic left shoulder pain (03/18/2021), Chronic low back pain without sciatica (10/16/2008), CKD (chronic kidney disease), stage III (HCC), Coronary artery disease, Coronary artery disease involving native heart with angina pectoris, unspecified vessel or lesion type (08/10/2023), COVID (10/04/2023), Diabetes mellitus, DKA (diabetic ketoacidosis) (HCC) (10/03/2023), Dyspnea, Fall (10/04/2023), GERD (gastroesophageal reflux disease), CABG (08/23/2018), Hyperlipidemia, Hypertension, Hypertension associated with diabetes (HCC) (12/22/2008), Hypertensive nephropathy (08/18/2020), Insulin  dependent type 2 diabetes mellitus (HCC) (12/22/2020), Left ventricular dysfunction (08/18/2020), Long term (current) use of antithrombotics/antiplatelets (09/21/2022), Peripheral artery disease (08/31/2021), Proteinuria (08/18/2020), 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   Surgical History:   Past Surgical History:  Procedure Laterality Date   ABDOMINAL AORTOGRAM W/LOWER EXTREMITY N/A 09/21/2022   Procedure: ABDOMINAL AORTOGRAM W/LOWER EXTREMITY;  Surgeon: Darron Deatrice LABOR, MD;  Location: [facility] INVASIVE CV LAB;  Service: Cardiovascular;  Laterality: N/A;   CORONARY ARTERY BYPASS GRAFT N/A 08/23/2018   Procedure: CORONARY ARTERY BYPASS GRAFTING (CABG) x 3; -Left Internal Mammary Artery to Left Anterior Descending Artery, -Right Internal Mammary Artery to Right Coronary Artery, -Saphenous Vein Graft to Obtuse Marginal;  ENDOSCOPIC HARVEST GREATER SAPHENOUS VEIN  -Right Thigh;  Surgeon: Dusty Sudie DEL, MD;  Location: [facility] OR;   Service: Open Heart Surgery;  Laterality: N/A;   CORONARY PRESSURE/FFR STUDY N/A 08/20/2018   Procedure: INTRAVASCULAR PRESSURE WIRE/FFR STUDY;  Surgeon: Wonda Sharper, MD;  Location: [facility] INVASIVE CV LAB;  Service: Cardiovascular;  Laterality: N/A;   LEFT HEART CATH AND CORONARY ANGIOGRAPHY N/A 08/20/2018   Procedure: LEFT HEART CATH AND CORONARY ANGIOGRAPHY;  Surgeon: Wonda Sharper, MD;  Location: [facility] INVASIVE CV LAB;  Service: Cardiovascular;  Laterality: N/A;   PERIPHERAL VASCULAR ATHERECTOMY Left 09/21/2022   Procedure: PERIPHERAL VASCULAR ATHERECTOMY;  Surgeon: Darron Deatrice LABOR, MD;  Location: [facility] INVASIVE CV LAB;  Service: Cardiovascular;  Laterality: Left;  SFA   PERIPHERAL VASCULAR BALLOON ANGIOPLASTY Left 09/21/2022   Procedure: PERIPHERAL VASCULAR BALLOON ANGIOPLASTY;  Surgeon: Darron Deatrice LABOR, MD;  Location: [facility] INVASIVE CV LAB;  Service: Cardiovascular;  Laterality: Left;  SFA   TEE WITHOUT CARDIOVERSION N/A 08/23/2018   Procedure: TRANSESOPHAGEAL ECHOCARDIOGRAM (TEE);  Surgeon: Dusty Sudie DEL, MD;  Location: [facility] OR;  Service: Open Heart Surgery;  Laterality: N/A;     Social History:   reports that he has been smoking cigarettes. He started smoking about 54 years ago. He has a 48 pack-year smoking history. He has never used smokeless tobacco. He reports that he does not drink alcohol and does not use drugs.   Family History:  His family history includes Diabetes in his mother; Drug abuse in his son; Heart disease in his father and mother; Heart failure in his mother; Sudden death in his brother.   Allergies Allergies[1]   Home Medications  Prior to Admission medications  Medication Sig Start Date End Date Taking? Authorizing Provider  APIXABAN  (ELIQUIS ) VTE STARTER PACK (10MG  AND 5MG ) Take as directed on package: start with two-5mg  tablets twice daily for 7 days. On day 8, switch to one-5mg  tablet twice daily. 11/16/24  Yes Gherghe, Costin M, MD  aspirin  81 MG tablet Take 81  mg by mouth daily.   Yes [provider]  carvedilol  (COREG ) 6.25 MG tablet Take 1 tablet (6.25 mg total) by mouth 2 (two) times daily with  a meal. 11/17/24  Yes Elicia Hamlet, MD  cetirizine (ZYRTEC) 10 MG chewable tablet Chew 10 mg by mouth as needed for allergies.   Yes [provider]  diltiazem  (CARDIZEM  CD) 180 MG 24 hr capsule Take 180 mg by mouth daily. 10/29/24  Yes [provider]  Evolocumab  (REPATHA  SURECLICK) 140 MG/ML SOAJ Inject 140 mg into the skin every 14 (fourteen) days. 03/12/24  Yes Darron Deatrice LABOR, MD  furosemide  (LASIX ) 40 MG tablet Take 0.5 tablets (20 mg total) by mouth daily. 11/18/24  Yes Elicia Hamlet, MD  Glucagon  (BAQSIMI  TWO PACK) 3 MG/DOSE POWD Use as directed PRN low glucose 10/30/24  Yes McDiarmid, Krystal BIRCH, MD  hydrALAZINE  (APRESOLINE ) 50 MG tablet Take 1 tablet (50 mg total) by mouth 3 (three) times daily. 08/22/24 08/17/25 Yes McDiarmid, Krystal BIRCH, MD  hydrochlorothiazide  (HYDRODIURIL ) 12.5 MG tablet Take 1 tablet (12.5 mg total) by mouth daily. 10/30/24  Yes McDiarmid, Krystal BIRCH, MD  HYDROcodone -acetaminophen  (NORCO) 10-325 MG tablet TAKE 1 TABLET BY MOUTH TWICE DAILY AS NEEDED Patient taking differently: Take 1 tablet by mouth in the morning and at bedtime. 11/06/24  Yes McDiarmid, Krystal BIRCH, MD  Insulin  Glargine (BASAGLAR  KWIKPEN) 100 UNIT/ML DIAL AND INJECT 20 UNITS UNDER THE SKIN DAILY. Patient taking differently: Inject 10-12 Units into the skin daily. 08/19/24  Yes McDiarmid, Krystal BIRCH, MD  insulin  lispro (HUMALOG ) 100 UNIT/ML injection Inject 0.03-0.05 mLs (3-5 Units total) into the skin 3 (three) times daily with meals. 3-4 units prior to breakfast, 4-5 units prior to evening meal 08/08/24 02/04/25 Yes McDiarmid, Krystal BIRCH, MD  metFORMIN  (GLUCOPHAGE ) 500 MG tablet Take 2 tablets (1,000 mg total) by mouth 2 (two) times daily with a meal. TAKE 2 TABLETS BY MOUTH TWICE DAILY WITH A MEAL 10/23/23 11/15/25 Yes McDiarmid, Krystal BIRCH, MD  pantoprazole  (PROTONIX ) 40 MG  tablet Take 1 tablet (40 mg total) by mouth daily. 11/18/24  Yes Elicia Hamlet, MD  promethazine  (PHENERGAN ) 25 MG tablet Take 1 tablet (25 mg total) by mouth every 6 (six) hours as needed for nausea or vomiting. Patient taking differently: Take 12.5-25 mg by mouth every 6 (six) hours as needed for nausea or vomiting. 09/24/24  Yes McDiarmid, Krystal BIRCH, MD  tamsulosin  (FLOMAX ) 0.4 MG CAPS capsule Take 1 capsule (0.4 mg total) by mouth daily. 02/20/24  Yes McDiarmid, Krystal BIRCH, MD  apixaban  (ELIQUIS ) 5 MG TABS tablet Take 1 tablet (5 mg total) by mouth 2 (two) times daily. To begin after completion of Eliquis  (apixaban ) VTE starter pack Patient not taking: Reported on 11/19/2024 12/17/24   Trixie Nilda HERO, MD     Critical care time: 4  The patient is critically ill with multiple organ system failure and requires high complexity decision making for assessment and support, frequent evaluation and titration of therapies, advanced monitoring, review of radiographic studies and interpretation of complex data.    Critical Care Time devoted to patient care services, exclusive of separately billable procedures, described in this note is 50   Tinnie FORBES Adolph DEVONNA [place] [facility] 11/22/2024 6:04 PM  Please see [email or web address] for pager details.  From 7A-7P if no response, please call [phone] After hours, please call ELink [phone]       [1]  Allergies Allergen Reactions   Amlodipine  Other (See Comments)    Leg swelling   Empagliflozin  Other (See Comments)    Euglycemic DKA   Amitriptyline  Other (See Comments)    Urinary retention   Lisinopril  Other (  See Comments)    Hyperkalemia    Nortriptyline  Hcl Other (See Comments)    Vivid / bad dreams   Statins     Aches in Joints    Simvastatin  Other (See Comments)    Arthralgias

## 2024-11-22 NOTE — Anesthesia Procedure Notes (Signed)
 Procedure Name: Intubation Date/Time: 11/22/2024 1:17 PM  Performed by: Delores Duwaine SAUNDERS, CRNAPre-anesthesia Checklist: Patient identified, Emergency Drugs available, Suction available and Patient being monitored Patient Re-evaluated:Patient Re-evaluated prior to induction Oxygen Delivery Method: Circle System Utilized Preoxygenation: Pre-oxygenation with 100% oxygen Induction Type: IV induction Ventilation: Mask ventilation without difficulty Laryngoscope Size: Mac and 4 Grade View: Grade I Tube type: Oral Tube size: 7.5 mm Number of attempts: 1 Airway Equipment and Method: Stylet and Oral airway Placement Confirmation: ETT inserted through vocal cords under direct vision, positive ETCO2 and breath sounds checked- equal and bilateral Secured at: 23 cm Tube secured with: Tape Dental Injury: Teeth and Oropharynx as per pre-operative assessment

## 2024-11-22 NOTE — Assessment & Plan Note (Addendum)
 Recently discharged on eliquis . Normal wob on room air. - Continue heparin  drip per pharm for PE in setting of future procedures - Hold eliquis 

## 2024-11-22 NOTE — Progress Notes (Signed)
 Progress Note     Interval: Per wife and RN, had some confusion last night. Is alert and oriented this AM. Appears quite jaundiced, worse from yesterday per wife. WBC continuing to trend up.  Objective: Vital signs in last 24 hours: Temp:  [97.6 F (36.4 C)-97.8 F (36.6 C)] 97.8 F (36.6 C) (12/12 0734) Pulse Rate:  [56-66] 56 (12/12 0734) Resp:  [16-19] 16 (12/12 0734) BP: (121-139)/(59-72) 131/62 (12/12 0734) SpO2:  [96 %-99 %] 96 % (12/12 0734) Weight:  [67.5 kg] 67.5 kg (12/12 0643) Last BM Date : 11/19/24  Intake/Output from previous day: 12/11 0701 - 12/12 0700 In: 592.5 [P.O.:200; I.V.:251.9; IV Piggyback:140.7] Out: 600 [Urine:600] Intake/Output this shift: No intake/output data recorded.  PE: General: no acute distress HEENT: sclera are icteric Heart: regular, rate, and rhythm.   Lungs: Respiratory effort nonlabored Abd: soft, nondistended, no RUQ tenderness, minimal tenderness in epigastrium Skin:jaundiced Psych: A&Ox3 with an appropriate affect.    Lab Results:  Recent Labs    11/21/24 0424 11/22/24 0344  WBC 17.9* 22.6*  HGB 8.9* 8.4*  HCT 25.6* 24.3*  PLT 244 258   BMET Recent Labs    11/21/24 0424 11/22/24 0344  NA 134* 134*  K 3.7 3.7  CL 101 99  CO2 23 25  GLUCOSE 200* 154*  BUN 34* 41*  CREATININE 2.51* 2.95*  CALCIUM  8.0* 8.4*   PT/INR No results for input(s): LABPROT, INR in the last 72 hours. CMP     Component Value Date/Time   NA 134 (L) 11/22/2024 0344   NA 137 10/30/2024 1126   K 3.7 11/22/2024 0344   CL 99 11/22/2024 0344   CO2 25 11/22/2024 0344   GLUCOSE 154 (H) 11/22/2024 0344   BUN 41 (H) 11/22/2024 0344   BUN 38 (H) 10/30/2024 1126   CREATININE 2.95 (H) 11/22/2024 0344   CREATININE 1.28 11/24/2023 0000   CALCIUM  8.4 (L) 11/22/2024 0344   PROT 5.9 (L) 11/22/2024 0344   PROT 6.2 06/11/2024 1046   ALBUMIN  2.0 (L) 11/22/2024 0344   ALBUMIN  3.6 (L) 06/11/2024 1046   AST 231 (H) 11/22/2024 0344   ALT 198  (H) 11/22/2024 0344   ALKPHOS 590 (H) 11/22/2024 0344   BILITOT 4.2 (H) 11/22/2024 0344   BILITOT <0.2 06/11/2024 1046   GFRNONAA 22 (L) 11/22/2024 0344   GFRNONAA 62 08/12/2015 0943   GFRAA 55 (L) 12/10/2020 1643   GFRAA 71 08/12/2015 0943   Lipase     Component Value Date/Time   LIPASE 28 11/19/2024 0917     Studies/Results: NM Hepatobiliary Liver Func Result Date: 11/20/2024 EXAM: NM HEPATOBILLARY SCAN 11/20/2024 04:18:31 PM TECHNIQUE: RADIOPHARMACEUTICAL: 7.5 mCi Tc-7m mebrofenin Dynamic images of the abdomen and pelvis were obtained in the anterior projection for 90 minutes after intravenous administration of radiopharmaceutical. COMPARISON: MRI 11/19/2024. CLINICAL HISTORY: Cholecystitis. FINDINGS: There is uniform uptake within the liver. No activity is present within the common bile duct over the entirety of the exam. The gallbladder cannot be assessed without filling of the common duct. Findings concerning for distal common bile duct obstruction in light of the prior day MRCP. IMPRESSION: 1. Findings concerning for distal common bile duct obstruction in light of the prior day MRCP. Electronically signed by: Norleen Boxer MD 11/20/2024 04:26 PM EST RP Workstation: [id number]    Assessment/Plan  Biliary obstruction  - CT 12/9 with thickened gallbladder, intrahepatic biliary ductal dilation, soft tissue nodularity in location of expected left adrenal gland, possible borderline lymphadenopathy in left  para-aortic space - RUQ US  12/9 with gallbladder wall thickening without focal tenderness and dilated common duct - MRCP 12/9 without choledocholithiasis, multiple tiny cystic foci in pancreatic head, distended gallbladder with mild diffuse wall thickening  which is nonspecific, anasarca and minimal perihepatic ascites - no clear cholelithiasis noted on any imaging modality  - HIDA 12/10 with no CBD activity and gallbladder can't really be assessed in that setting - findings suggestive  of distal CBD obstruction  - Tbili is 4.2 from 1.1 on admit, Alk Phos/AST/ALT all rising as well and patient is jaundiced - GI following and planning ERCP/EUS - pt with worsening leukocytosis of 22.6K but may be more related to biliary obstruction than cholecystitis, would continue abx regardless right now - General surgery will continue to follow GI workup but no plans for surgical intervention at this time  FEN: NPO for procedure, IVF per [facility] VTE: hep gtt--held for procedure ID: Zosyn   - per [facility] -  Acute PE - hep gtt CAD s/p CABG HTN HLD CHFrEF T2DM AKI on CKD PAD OSA Chronic back pain Hx of tobacco abuse   LOS: 3 days   I reviewed Consultant GI notes, hospitalist notes, last 24 h vitals and pain scores, last 48 h intake and output, last 24 h labs and trends, and last 24 h imaging results.  This care required moderate level of medical decision making.    Richerd Silversmith, MD  [facility] 11/22/2024, 9:41 AM Please see Amion for pager number during day hours 7:00am-4:30pm

## 2024-11-22 NOTE — Op Note (Signed)
 [facility] Patient Name: Colin Keller Procedure Date : 11/22/2024 MRN: [id number] Attending MD: Norleen SAILOR. Abran , MD, [id number] Date of Birth: 18-Apr-1956 CSN: [id number] Age: 67 Admit Type: Inpatient Procedure:                ERCP with biliary sphincterotomy and common bile                            duct stent placement Indications:              Common bile duct stricture, Abnormal abdominal CT,                            Abnormal MRCP, Elevated liver enzymes Providers:                Norleen SAILOR. Abran, MD, Ozell Pouch, Haskel Chris, Technician Referring MD:             [facility] hospitalist Medicines:                General Anesthesia Complications:            No immediate complications. Estimated Blood Loss:     Estimated blood loss: none. Procedure:                Pre-Anesthesia Assessment:                           - Prior to the procedure, a History and Physical                            was performed, and patient medications and                            allergies were reviewed. The patient's tolerance of                            previous anesthesia was also reviewed. The risks                            and benefits of the procedure and the sedation                            options and risks were discussed with the patient.                            All questions were answered, and informed consent                            was obtained. Prior Anticoagulants: The patient has                            taken heparin , last dose was day of procedure. ASA  Grade Assessment: III - A patient with severe                            systemic disease. After reviewing the risks and                            benefits, the patient was deemed in satisfactory                            condition to undergo the procedure.                           After obtaining informed consent, the scope was                             passed under direct vision. Throughout the                            procedure, the patient's blood pressure, pulse, and                            oxygen saturations were monitored continuously. The                            [id number]-Q190V ([id number] Olympus duodenoscope was                            introduced through the mouth, and used to inject                            contrast into and used to inject contrast into the                            bile duct and ventral pancreatic duct. The ERCP was                            technically difficult and complex. The patient                            tolerated the procedure well. Scope In: Scope Out: Findings:      1. The side-viewing endoscope was passed blindly into the esophagus      2. The stomach was not fully evaluated. The duodenum revealed a normal       appearing major papilla. The minor papilla was not sought.      3. A scout radiograph of the abdomen with the endoscope and position       revealed median sternotomy wires      4. Initial injection of contrast yielded partial filling of the distal       bile duct and pancreatic duct. They appear to have a common channel.      5. Subsequent injection of contrast yielded a normal-appearing       pancreatic duct and a dilated proximal biliary tree      6. There was a submucosal injection which made cannulation more  difficult.      7. A wire easily passed into the pancreatic duct. The 2 wire approach       was used to successfully cannulate the common bile duct.      8. Injection of contrast yielded a distal biliary stricture estimated to       be about 2.5 to 3 cm in length. Above this was diffuse dilation of the       biliary tree. There was NO filling of the gallbladder      9. Due to difficulties with cannulation and the need for biliary       drainage, it was elected not to brush the biliary stricture, but rather       place a 10 French 7 cm plastic biliary stent.  Stent was in good position       with excellent drainage. The bile that drained was dark, not purulent.. Impression:               1. Distal biliary stricture with obstruction as                            described                           2. No evidence for cholangitis or common duct stones                           3. Question the stricture is part of an obstructing                            lesion involving the cystic duct with concurrent                            cholecystitis                           4. Normal pancreatic duct. Recommendation:           1. Standard post ERCP care and observation                           2. Trend liver tests, white blood cell count, and                            clinical course                           3. If the patient continues to behave as if he has                            cholecystitis, may benefit from cholecystostomy tube                           All of the above information has been shared with                            the inpatient general GI team, who will resume GI  care Procedure Code(s):        --- Professional ---                           [phone], Endoscopic retrograde                            cholangiopancreatography (ERCP); with placement of                            endoscopic stent into biliary or pancreatic duct,                            including pre- and post-dilation and guide wire                            passage, when performed, including sphincterotomy,                            when performed, each stent Diagnosis Code(s):        --- Professional ---                           K83.1, Obstruction of bile duct                           R74.8, Abnormal levels of other serum enzymes                           R93.5, Abnormal findings on diagnostic imaging of                            other abdominal regions, including retroperitoneum                           R93.2, Abnormal findings  on diagnostic imaging of                            liver and biliary tract CPT copyright 2022 [employer]. All rights reserved. The codes documented in this report are preliminary and upon coder review may  be revised to meet current compliance requirements. Norleen SAILOR. Abran, MD 11/22/2024 3:26:16 PM This report has been signed electronically. Number of Addenda: 0

## 2024-11-23 ENCOUNTER — Inpatient Hospital Stay (HOSPITAL_COMMUNITY)

## 2024-11-23 DIAGNOSIS — R1011 Right upper quadrant pain: Secondary | ICD-10-CM

## 2024-11-23 DIAGNOSIS — I5022 Chronic systolic (congestive) heart failure: Secondary | ICD-10-CM

## 2024-11-23 DIAGNOSIS — E1165 Type 2 diabetes mellitus with hyperglycemia: Secondary | ICD-10-CM

## 2024-11-23 DIAGNOSIS — A419 Sepsis, unspecified organism: Secondary | ICD-10-CM

## 2024-11-23 DIAGNOSIS — I2699 Other pulmonary embolism without acute cor pulmonale: Secondary | ICD-10-CM

## 2024-11-23 DIAGNOSIS — E1122 Type 2 diabetes mellitus with diabetic chronic kidney disease: Secondary | ICD-10-CM

## 2024-11-23 DIAGNOSIS — K810 Acute cholecystitis: Secondary | ICD-10-CM

## 2024-11-23 DIAGNOSIS — Z9889 Other specified postprocedural states: Secondary | ICD-10-CM

## 2024-11-23 DIAGNOSIS — N179 Acute kidney failure, unspecified: Secondary | ICD-10-CM

## 2024-11-23 DIAGNOSIS — G9341 Metabolic encephalopathy: Secondary | ICD-10-CM

## 2024-11-23 DIAGNOSIS — R9431 Abnormal electrocardiogram [ECG] [EKG]: Secondary | ICD-10-CM

## 2024-11-23 DIAGNOSIS — N1832 Chronic kidney disease, stage 3b: Secondary | ICD-10-CM

## 2024-11-23 DIAGNOSIS — D638 Anemia in other chronic diseases classified elsewhere: Secondary | ICD-10-CM

## 2024-11-23 LAB — GLUCOSE, CAPILLARY
Glucose-Capillary: 209 mg/dL — ABNORMAL HIGH (ref 70–99)
Glucose-Capillary: 226 mg/dL — ABNORMAL HIGH (ref 70–99)
Glucose-Capillary: 262 mg/dL — ABNORMAL HIGH (ref 70–99)
Glucose-Capillary: 278 mg/dL — ABNORMAL HIGH (ref 70–99)
Glucose-Capillary: 303 mg/dL — ABNORMAL HIGH (ref 70–99)
Glucose-Capillary: 371 mg/dL — ABNORMAL HIGH (ref 70–99)

## 2024-11-23 LAB — ECHOCARDIOGRAM LIMITED
Area-P 1/2: 4.63 cm2
Calc EF: 52 %
Height: 70 in
Single Plane A2C EF: 54 %
Single Plane A4C EF: 49.6 %
Weight: 2380.97 [oz_av]

## 2024-11-23 LAB — COMPREHENSIVE METABOLIC PANEL WITH GFR
ALT: 143 U/L — ABNORMAL HIGH (ref 0–44)
AST: 93 U/L — ABNORMAL HIGH (ref 15–41)
Albumin: 2 g/dL — ABNORMAL LOW (ref 3.5–5.0)
Alkaline Phosphatase: 450 U/L — ABNORMAL HIGH (ref 38–126)
Anion gap: 12 (ref 5–15)
BUN: 61 mg/dL — ABNORMAL HIGH (ref 8–23)
CO2: 23 mmol/L (ref 22–32)
Calcium: 8.2 mg/dL — ABNORMAL LOW (ref 8.9–10.3)
Chloride: 100 mmol/L (ref 98–111)
Creatinine, Ser: 3.73 mg/dL — ABNORMAL HIGH (ref 0.61–1.24)
GFR, Estimated: 17 mL/min — ABNORMAL LOW (ref >60)
Glucose, Bld: 194 mg/dL — ABNORMAL HIGH (ref 70–99)
Potassium: 3.7 mmol/L (ref 3.5–5.1)
Sodium: 135 mmol/L (ref 135–145)
Total Bilirubin: 2.8 mg/dL — ABNORMAL HIGH (ref 0.0–1.2)
Total Protein: 5.7 g/dL — ABNORMAL LOW (ref 6.5–8.1)

## 2024-11-23 LAB — CBC
HCT: 21.6 % — ABNORMAL LOW (ref 39.0–52.0)
Hemoglobin: 7.5 g/dL — ABNORMAL LOW (ref 13.0–17.0)
MCH: 31.5 pg (ref 26.0–34.0)
MCHC: 34.7 g/dL (ref 30.0–36.0)
MCV: 90.8 fL (ref 80.0–100.0)
Platelets: 264 K/uL (ref 150–400)
RBC: 2.38 MIL/uL — ABNORMAL LOW (ref 4.22–5.81)
RDW: 15.4 % (ref 11.5–15.5)
WBC: 16.7 K/uL — ABNORMAL HIGH (ref 4.0–10.5)
nRBC: 0 % (ref 0.0–0.2)

## 2024-11-23 LAB — TYPE AND SCREEN
ABO/RH(D): A POS
Antibody Screen: NEGATIVE
Unit division: 0

## 2024-11-23 LAB — HEPARIN LEVEL (UNFRACTIONATED): Heparin Unfractionated: >1.1 [IU]/mL — ABNORMAL HIGH (ref 0.30–0.70)

## 2024-11-23 LAB — BPAM RBC
Blood Product Expiration Date: 202512302359
ISSUE DATE / TIME: 202512121945
Unit Type and Rh: 6200

## 2024-11-23 LAB — APTT: aPTT: 38 s — ABNORMAL HIGH (ref 24–36)

## 2024-11-23 LAB — MAGNESIUM: Magnesium: 2 mg/dL (ref 1.7–2.4)

## 2024-11-23 MED ORDER — BOOST / RESOURCE BREEZE PO LIQD CUSTOM
1.0000 | Freq: Three times a day (TID) | ORAL | Status: DC
  Administered 2024-11-23 – 2024-12-02 (×17): 1 via ORAL

## 2024-11-23 MED ORDER — PIPERACILLIN-TAZOBACTAM IN DEX 2-0.25 GM/50ML IV SOLN
2.2500 g | Freq: Four times a day (QID) | INTRAVENOUS | Status: DC
  Administered 2024-11-23 – 2024-11-24 (×5): 2.25 g via INTRAVENOUS
  Filled 2024-11-23 (×8): qty 50

## 2024-11-23 MED ORDER — STERILE WATER FOR INJECTION IJ SOLN
INTRAMUSCULAR | Status: AC
  Filled 2024-11-23: qty 10

## 2024-11-23 MED ORDER — POTASSIUM CHLORIDE CRYS ER 20 MEQ PO TBCR
20.0000 meq | EXTENDED_RELEASE_TABLET | Freq: Once | ORAL | Status: AC
  Administered 2024-11-23: 20 meq via ORAL
  Filled 2024-11-23: qty 1

## 2024-11-23 MED ORDER — HYDRALAZINE HCL 20 MG/ML IJ SOLN
10.0000 mg | INTRAMUSCULAR | Status: DC | PRN
  Administered 2024-11-24: 10 mg via INTRAVENOUS
  Filled 2024-11-23 (×3): qty 1

## 2024-11-23 MED ORDER — INSULIN GLARGINE 100 UNIT/ML ~~LOC~~ SOLN
10.0000 [IU] | Freq: Two times a day (BID) | SUBCUTANEOUS | Status: DC
  Administered 2024-11-23: 10 [IU] via SUBCUTANEOUS
  Filled 2024-11-23 (×2): qty 0.1

## 2024-11-23 MED ORDER — ACETAMINOPHEN 325 MG PO TABS: 650.0000 mg | ORAL_TABLET | Freq: Three times a day (TID) | ORAL | Status: DC | PRN

## 2024-11-23 MED ORDER — INSULIN GLARGINE 100 UNIT/ML ~~LOC~~ SOLN
5.0000 [IU] | Freq: Two times a day (BID) | SUBCUTANEOUS | Status: DC
  Administered 2024-11-23: 5 [IU] via SUBCUTANEOUS
  Filled 2024-11-23 (×3): qty 0.05

## 2024-11-23 MED ORDER — HYDROCODONE-ACETAMINOPHEN 7.5-325 MG PO TABS
1.0000 | ORAL_TABLET | Freq: Four times a day (QID) | ORAL | Status: DC | PRN
  Administered 2024-11-23: 1 via ORAL
  Filled 2024-11-23: qty 1

## 2024-11-23 MED ORDER — ISOSORBIDE MONONITRATE ER 60 MG PO TB24
60.0000 mg | ORAL_TABLET | Freq: Every day | ORAL | Status: DC
  Administered 2024-11-23 – 2024-12-06 (×14): 60 mg via ORAL
  Filled 2024-11-23 (×14): qty 1

## 2024-11-23 MED ORDER — OLANZAPINE 10 MG IM SOLR
2.5000 mg | Freq: Once | INTRAMUSCULAR | Status: AC | PRN
  Administered 2024-11-23: 2.5 mg via INTRAMUSCULAR
  Filled 2024-11-23: qty 10

## 2024-11-23 MED ORDER — HYDRALAZINE HCL 50 MG PO TABS
50.0000 mg | ORAL_TABLET | Freq: Three times a day (TID) | ORAL | Status: AC
  Administered 2024-11-23 – 2024-11-29 (×18): 50 mg via ORAL
  Filled 2024-11-23 (×18): qty 1

## 2024-11-23 NOTE — Progress Notes (Signed)
 eLink Physician-Brief Progress Note Patient Name: Colin Keller DOB: 1956-02-01 MRN: [id number]   Date of Service  11/23/2024  HPI/Events of Note  BP 166/69, MAP 98  eICU Interventions  PRN iv Hydralazine  ordered for SBP > 160.        [name] 11/23/2024, 1:09 AM
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
## 2024-11-23 NOTE — Progress Notes (Signed)
 Progress Note   Subjective   Patient transferred to ICU post ERCP yesterday for bradycardia. He was agitated overnight. Seems to be doing much better this AM, mental status more clear, HR stable. He denies any pain. Already ate something this AM.    Objective   Vital signs in last 24 hours: Temp:  [95.6 F (35.3 C)-98.7 F (37.1 C)] 96.9 F (36.1 C) (12/13 0715) Pulse Rate:  [36-98] 66 (12/13 0800) Resp:  [8-24] 17 (12/13 0800) BP: (103-173)/(42-86) 156/68 (12/13 0800) SpO2:  [95 %-100 %] 98 % (12/13 0800) Last BM Date : 11/19/24 General:    white male in NAD Neurologic:  Alert and oriented,  grossly normal neurologically. Psych:  Cooperative. Normal mood and affect.  Intake/Output from previous day: 12/12 0701 - 12/13 0700 In: 2033.9 [I.V.:1210; Blood:226; IV Piggyback:597.9] Out: -  Intake/Output this shift: Total I/O In: 126.5 [IV Piggyback:126.5] Out: -   Lab Results: Recent Labs    11/22/24 1735 11/22/24 1844 11/23/24 0208  WBC 16.8* 16.7* 16.7*  HGB 6.8* 6.9* 7.5*  HCT 19.8* 20.7* 21.6*  PLT 210 217 264   BMET Recent Labs    11/22/24 1221 11/22/24 1505 11/22/24 1735 11/23/24 0208  NA 133* 136 136 135  K 4.2 3.8 3.7 3.7  CL 97* 99 101 100  CO2 22  --  22 23  GLUCOSE 312* 299* 237* 194*  BUN 50* 48* 54* 61*  CREATININE 3.27* 3.30* 3.43* 3.73*  CALCIUM  8.1*  --  7.9* 8.2*   LFT Recent Labs    11/23/24 0208  PROT 5.7*  ALBUMIN  2.0*  AST 93*  ALT 143*  ALKPHOS 450*  BILITOT 2.8*   PT/INR No results for input(s): LABPROT, INR in the last 72 hours.  Studies/Results: DG C-Arm 1-60 Min-No Report Result Date: 11/22/2024 Fluoroscopy was utilized by the requesting physician.  No radiographic interpretation.   DG C-Arm 1-60 Min-No Report Result Date: 11/22/2024 Fluoroscopy was utilized by the requesting physician.  No radiographic interpretation.       Assessment / Plan:    68 y/o male here with the following:  Biliary  stricture / biliary obstruction - s/p ERCP with stenting 12/12 ? cholecystitis Abnormal imaging pancreas - limited by noncontrast study  ERCP yesterday confirmed CBD stricture. CBD stented per Dr. Abran - no purulent material noted, however his liver enzymes and WBC are all trending down this AM. We have suspected he may also have a compnent of cholecystitis causing this presentation. On antibiotics. Surgery does not think a good candidate for cholecystectomy currently. If he clinically worsens would pursue cholecystostomy tube per IR, however hopefully that is not needed at this time, he looks much better today. Some agitation overnight, in the setting of infection / anesthesia, mental status appears more appropriate this AM and HR stable.  Otherwise, unclear cause of biliary stricture at this time, need to rule out malignancy. Technically challenging ERCP yesterday, brushings not obtained. He will need further evaluation of his pancreas / biliary tree. Ideally EUS would be next step, unclear availability for that this upcoming week, will discuss with my advanced endoscopy colleagues. If EUS not available in near future, consider MRCP with contrast if safe per radiology (acute on chronic CKD).   Patient had already advanced diet upon rounds this AM, appears to be tolerating it well. Trend LAEs and WBC for now. We will reassess him tomorrow. Discussed with family at bedside, answered their questions. Call in the interim with  questions.  This encounter included highly complex medical decision making and included time reviewing the chart, face-to-face time with the patient, and documenting the encounter. The patient was provided an opportunity to ask questions and all were answered.   Marcey Naval, MD [facility]

## 2024-11-23 NOTE — Progress Notes (Addendum)
 Patient ID: Colin Keller, male   DOB: 09/26/1956, 68 y.o.   MRN: [id number]   Acute Care Surgery Service Progress Note:    Chief Complaint/Subjective: Events noted - agitated overnight Events noted immediately after ERCP Got 1u prbc last night Pt without c/o. No n/v/abd pain; currently eating solid food  Objective: Vital signs in last 24 hours: Temp:  [95.6 F (35.3 C)-98.7 F (37.1 C)] 96.9 F (36.1 C) (12/13 0715) Pulse Rate:  [36-98] 66 (12/13 0800) Resp:  [8-24] 17 (12/13 0800) BP: (103-173)/(42-86) 156/68 (12/13 0800) SpO2:  [95 %-100 %] 98 % (12/13 0800) Last BM Date : 11/19/24  Intake/Output from previous day: 12/12 0701 - 12/13 0700 In: 2033.9 [I.V.:1210; Blood:226; IV Piggyback:597.9] Out: -  Intake/Output this shift: Total I/O In: 126.5 [IV Piggyback:126.5] Out: -   Lungs:  nonlabored  Cardiovascular: reg  Abd: soft, nt, nd, no guarding  Extremities: no edema, +SCDs  Neuro: alertx 3 nonfocal  Lab Results: CBC  Recent Labs    11/22/24 1844 11/23/24 0208  WBC 16.7* 16.7*  HGB 6.9* 7.5*  HCT 20.7* 21.6*  PLT 217 264   BMET Recent Labs    11/22/24 1735 11/23/24 0208  NA 136 135  K 3.7 3.7  CL 101 100  CO2 22 23  GLUCOSE 237* 194*  BUN 54* 61*  CREATININE 3.43* 3.73*  CALCIUM  7.9* 8.2*   LFT    Latest Ref Rng & Units 11/23/2024    2:08 AM 11/22/2024   12:21 PM 11/22/2024    3:44 AM  Hepatic Function  Total Protein 6.5 - 8.1 g/dL 5.7  5.9  5.9   Albumin  3.5 - 5.0 g/dL 2.0  2.0  2.0   AST 15 - 41 U/L 93  154  231   ALT 0 - 44 U/L 143  169  198   Alk Phosphatase 38 - 126 U/L 450  582  590   Total Bilirubin 0.0 - 1.2 mg/dL 2.8  3.9  4.2    PT/INR No results for input(s): LABPROT, INR in the last 72 hours. ABG Recent Labs    11/22/24 1734  HCO3 23.7    Studies/Results:  Anti-infectives: Anti-infectives (From admission, onward)    Start     Dose/Rate Route Frequency Ordered Stop   11/22/24 2100  piperacillin -tazobactam  (ZOSYN ) IVPB 3.375 g        3.375 g 12.5 mL/hr over 240 Minutes Intravenous Every 8 hours 11/22/24 2100     11/19/24 2000  piperacillin -tazobactam (ZOSYN ) IVPB 3.375 g  Status:  Discontinued        3.375 g 12.5 mL/hr over 240 Minutes Intravenous Every 8 hours 11/19/24 1901 11/22/24 2100   11/19/24 1300  piperacillin -tazobactam (ZOSYN ) IVPB 3.375 g        3.375 g 100 mL/hr over 30 Minutes Intravenous  Once 11/19/24 1259 11/19/24 1508       Medications: Scheduled Meds:  aspirin  EC  81 mg Oral Daily   Chlorhexidine  Gluconate Cloth  6 each Topical Daily   feeding supplement  1 Container Oral TID BM   hydrALAZINE   50 mg Oral Q8H   indomethacin   100 mg Rectal Once   insulin  aspart  0-15 Units Subcutaneous Q4H   insulin  glargine  10 Units Subcutaneous BID   isosorbide  mononitrate  60 mg Oral Daily   lidocaine   3 patch Transdermal Q24H   nicotine   7 mg Transdermal Daily   pantoprazole   40 mg Oral Daily   polyethylene glycol  17 g Oral Daily   senna  1 tablet Oral Daily   tamsulosin   0.4 mg Oral Daily   Continuous Infusions:  acetaminophen  Stopped (11/23/24 0618)   piperacillin -tazobactam (ZOSYN )  IV 12.5 mL/hr at 11/23/24 0806   PRN Meds:.alum & mag hydroxide-simeth, docusate sodium , hydrALAZINE , melatonin, ondansetron  (ZOFRAN ) IV, polyethylene glycol  Assessment/Plan: Patient Active Problem List   Diagnosis Date Noted   Elevated liver enzymes 11/22/2024   Delusions (HCC) 11/22/2024   Biliary obstruction (HCC) 11/22/2024   Symptomatic bradycardia 11/22/2024   Leukocytosis 11/22/2024   Biliary sepsis (HCC) 11/22/2024   Bile duct stricture (HCC) 11/21/2024   Abnormal finding on GI tract imaging 11/21/2024   Anticoagulated 11/21/2024   Cholecystitis, acute 11/20/2024   Pancreatic lesion 11/20/2024   Abdominal pain 11/19/2024   Chronic health problem 11/17/2024   Hypoglycemia 11/17/2024   Exertional dyspnea 11/15/2024   Acute pulmonary embolism (HCC) 11/15/2024   AKI (acute  kidney injury) 10/04/2023   Pleural effusion due to CHF (congestive heart failure) (HCC) 10/04/2023   Statin myopathy 04/10/2023   Subclavian artery stenosis, right 02/10/2023   Normocytic anemia 09/22/2022   Severe Lumbar spinal stenosis 09/03/2022   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 07/28/2022   Paresthesia of left foot 05/13/2022   Heavy smoker (more than 20 cigarettes per day) 09/27/2021   Bilateral carotid artery disease 08/31/2021   Aneurysm of right subclavian artery 08/31/2021   Extremity atherosclerosis with intermittent claudication 08/31/2021   Encounter for long-term (current) use of insulin  (HCC) 05/25/2021   T2DM (type 2 diabetes mellitus) (HCC) 12/22/2020   BPH (benign prostatic hyperplasia) 08/27/2020   GERD without esophagitis 08/18/2020   Healthcare maintenance 05/08/2020   PAD (peripheral artery disease) 11/23/2018   HFrEF (heart failure with reduced ejection fraction) (HCC)    Encounter for chronic pain management 02/16/2015   Diabetic retinopathy (HCC) 08/30/2013   Pulmonary nodule less than 1 cm in diameter with moderate to high risk for malignant neoplasm 07/01/2013   CKD stage G3b/A3, GFR 30-44 and albumin  creatinine ratio >300 mg/g (HCC) 03/05/2013   DM (diabetes mellitus), type 2 with peripheral vascular complications (HCC) 10/24/2012   Hypertension associated with diabetes (HCC) 12/22/2008   Hyperlipidemia associated with type 2 diabetes mellitus (HCC) 12/21/2008   Chronic low back pain without sciatica 10/16/2008   s/p Procedures: ERCP, WITH INTERVENTION IF INDICATED SPHINCTEROTOMY, BILIARY INSERTION, STENT, BILE DUCT 11/22/2024   Biliary obstruction  - CT 12/9 with thickened gallbladder, intrahepatic biliary ductal dilation, soft tissue nodularity in location of expected left adrenal gland, possible borderline lymphadenopathy in left para-aortic space - RUQ US  12/9 with gallbladder wall thickening without focal tenderness and  dilated common duct - MRCP 12/9 without choledocholithiasis, multiple tiny cystic foci in pancreatic head, distended gallbladder with mild diffuse wall thickening  which is nonspecific, anasarca and minimal perihepatic ascites - no clear cholelithiasis noted on any imaging modality  - HIDA 12/10 with no CBD activity and gallbladder can't really be assessed in that setting - findings suggestive of distal CBD obstruction  - Tbili is 4.2 from 1.1 on admit, Alk Phos/AST/ALT all rising as well and patient is jaundiced -ERCP 12/13 - distal biliary stricture 3cm unclear etiology, plastic stent placed, no brushings, nonfilling of cystic duct -LFTs trending down  Pt has unclear etiology of his biliary stricture; nonfilling of cystic duct on ERCP suggestive of cholecystitis however clinically pt is improving- no fever, no pain on exam, wbc stable, lfts trending down -  At this point- cont  to trend LFTs, cont IV abx for poss cholecystitis; since improving I think can hold off on cholecystostomy tube (not a candidate for cholecystectomy during this admission); if develops worsening picture - rec cholecystostomy tube   FEN: carb modified, IVF per [facility] VTE: hep gtt-- ID: Zosyn    - per [facility] -  Acute PE - hep gtt CAD s/p CABG HTN HLD CHFrEF T2DM AKI on CKD PAD OSA Chronic back pain Hx of tobacco abuse    LOS: 3 days    I reviewed Consultant GI notes, hospitalist notes, last 24 h vitals and pain scores, last 48 h intake and output, last 24 h labs and trends, and last 24 h imaging results, reviewed ERCP note   This care required moderate level of medical decision making.  Disposition:  LOS: 4 days    Camellia HERO. Tanda, MD, FACS General, Bariatric, & Minimally Invasive Surgery [phone] [facility], [facility] [address]

## 2024-11-23 NOTE — Progress Notes (Signed)
 NAME:  Colin Keller, MRN:  [id number], DOB:  1956/06/12, LOS: 4 ADMISSION DATE:  11/19/2024, CONSULTATION DATE:  11/22/24 REFERRING MD:  Delores CHIEF COMPLAINT:  Hypotension, heart block   History of Present Illness:  Colin Keller is a 68 y.o. male who has a PMH as outlined below.  He had recent admission to [facility] 12/5 through 12/7 for left lower lobe PE.  He was discharged home with Eliquis  x 4 months.  He presented back to [facility] ED on 12/9 with abdominal pain, right upper quadrant for the past few weeks but progressively worse.  Apparently had this pain during his previous admission but chest pain was worse.  He was unable to sleep once he returned home therefore he came back to the ED.  Workup revealed a new elevation in LFTs as well as CTA and RUQ ultrasound that showed CBD dilatation and gallbladder wall thickening but no intrahepatic biliary ductal dilatation.  General surgery was consulted and MRCP was ordered as well as empiric antibiotics.  MRCP 12/9 demonstrated diffuse biliary ductal dilatation and a distal CBD stricture without evidence of choledocholithiasis.  HIDA scan 12/10 was then obtained to rule out cholecystitis but this was nondiagnostic.  GI waiting on the case and an ERCP was ultimately recommended.  On 12/12, he was taken for ERCP with biliary sphincterotomy and common bile duct stent placement.  Postprocedure, he was initially unresponsive with sinus bradycardia versus junctional rhythm in the 40s and borderline blood pressure.  Mental status gradually improved to the point he was oriented to person but remains slightly confused and disoriented to time/place/situation.  Given his bradycardia and borderline hypotension, PCCM and cardiology were both called in consultation.  Pertinent  Medical History:  has Hyperlipidemia associated with type 2 diabetes mellitus (HCC); Hypertension associated with diabetes (HCC); Chronic low back pain without sciatica; DM (diabetes  mellitus), type 2 with peripheral vascular complications (HCC); CKD stage G3b/A3, GFR 30-44 and albumin  creatinine ratio >300 mg/g (HCC); Pulmonary nodule less than 1 cm in diameter with moderate to high risk for malignant neoplasm; Diabetic retinopathy (HCC); Encounter for chronic pain management; HFrEF (heart failure with reduced ejection fraction) (HCC); PAD (peripheral artery disease); Healthcare maintenance; GERD without esophagitis; BPH (benign prostatic hyperplasia); T2DM (type 2 diabetes mellitus) (HCC); Encounter for long-term (current) use of insulin  (HCC); Bilateral carotid artery disease; Aneurysm of right subclavian artery; Extremity atherosclerosis with intermittent claudication; Heavy smoker (more than 20 cigarettes per day); Paresthesia of left foot; Severe Lumbar spinal stenosis; Normocytic anemia; Subclavian artery stenosis, right; Statin myopathy; 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   Significant Hospital Events: Including procedures, antibiotic start and stop dates in addition to other pertinent events   12/9 admit 12/9 MRCP with no choledocholithiasis, distended gallbladder with mild diffuse wall thickening, anasarca. 12/10 HIDA with no CBD activity, findings suggestive of distal CBD obstruction. 12/12 ERCP with biliary sphincterotomy and common bile duct stent placement 12/12 PCCM, cards consult 2/2 junctional rhythm and hypotension post procedure  Interim History / Subjective:  Overnight  patient was agitated requiring Zyprexa  and 2-point restraint He stated abdominal pain is better, remained afebrile, white count  is trending down Heart rate improved now in 60s  Objective:  Blood pressure (!) 168/68, pulse 63, temperature (!) 96.9 F (36.1 C), temperature source Axillary, resp. rate 15, height 5' 10 (1.778 m), weight 67.5 kg, SpO2 99%.        Intake/Output Summary (Last 24 hours) at 11/23/2024 0740 Last data filed at 11/23/2024 0600 Gross per 24 hour  Intake 2033.87 ml  Output --  Net 2033.87 ml   Filed Weights   11/22/24 0643  Weight: 67.5 kg     Physical Exam: General: Elderly male, lying on the bed HEENT: [place]/AT, eyes anicteric.  moist mucus membranes Neuro: Alert, awake following commands Chest: Coarse breath sounds, no wheezes or rhonchi Heart: Regular rate and rhythm, no murmurs or gallops Abdomen: Soft, nontender, nondistended, bowel sounds present  Labs and images reviewed  Patient Lines/Drains/Airways Status     Active Line/Drains/Airways     Name Placement date Placement time Site Days   Peripheral IV 11/20/24 20 G 1 Anterior;Right Forearm 11/20/24  1407  Forearm  3   Peripheral IV 11/20/24 20 G 1 Left;Lateral Forearm 11/20/24  1412  Forearm  3   GI Stent 11/22/24  1527  --  1            Assessment & Plan:   Symptomatic bradycardia/escape junctional rhythm Likely multifactorial in setting of sepsis. Patient hypothermic to 95 rectal on exam. +/- anesthesia side effect.  EKG with mixed sinus or junctional bradycardia. MAP 70 Patient received atropine  with no effect.  Now heart rate has improved, and 60s, blood pressure remains slightly elevated Cardiology was consulted, recommend watchful waiting and holding AV nodal blocking agents  Acute septic encephalopathy Patient mental status is improving, he is oriented to time place and person He was agitated overnight required Zyprexa  Continue to treat underlying cause  Sepsis due to  acute cholecystitis  Obstructive jaundice due to distal biliary tract stricture status post ERCP with biliary stent placement  Appreciate GI consult Continue IV Zosyn  If patient continued to have symptoms, he may need cholecystectomy General Surgery was consulted, will call them back to reevaluate the patient Patient was noted to have multiple small cystic lesion on pancreas likely due to chronic pancreatitis but pancreatic mass cannot be ruled out, he will need CT scan with pancreatic protocol once clinically stable White count is trending down He remained afebrile  Acute pulmonary embolism  Patient was recently diagnosed with acute PE His hemoglobin dropped and required PRBC transfusion, will hold off on IV heparin  infusion for now but needs to be restarted if hemoglobin remained stable by tomorrow  Chronic HFrEF Patient is on Coreg , hydralazine  Hold Coreg  for now, started on Imdur  and hydralazine   Acute kidney injury on CKD stage IIIb Continue to hold hydrochlorothiazide , lasix   Monitor intake and output Avoid nephrotoxic agent  Anemia of chronic disease Patient received 1 unit PRBC yesterday Monitor H&H and transfuse if less than 7  Poorly controlled diabetes type 2 with hyperglycemia Patient's hemoglobin A1c was 9.8 recently His blood sugars are not well-controlled He started on Lantus  10 units twice daily Continue sliding scale insulin  with CBG goal 140-180 Hold metformin    Labs   CBC: Recent Labs  Lab 11/21/24 0424 11/22/24 0344 11/22/24 1505 11/22/24 1735 11/22/24 1844 11/23/24 0208  WBC 17.9* 22.6*  --  16.8* 16.7* 16.7*  HGB 8.9* 8.4* 7.5* 6.8* 6.9* 7.5*  HCT 25.6* 24.3* 22.0* 19.8* 20.7* 21.6*  MCV 95.2 94.6  --  96.1 95.8  90.8  PLT 244 258  --  210 217 264    Basic Metabolic Panel: Recent Labs  Lab 11/17/24 0248 11/19/24 0917 11/20/24 0448 11/21/24 0424 11/22/24 0344 11/22/24 1221 11/22/24 1505 11/22/24 1735 11/23/24 0208  NA 140   < > 138  134* 134* 133* 136 136 135  K 3.5   < > 4.1 3.7 3.7 4.2 3.8 3.7 3.7  CL 110   < > 105 101 99 97* 99 101 100  CO2 23   < > 21* 23 25 22   --  22 23  GLUCOSE 65*   < > 173* 200* 154* 312* 299* 237* 194*  BUN 33*   < > 33* 34* 41* 50* 48* 54* 61*  CREATININE 2.46*   < > 2.80* 2.51* 2.95* 3.27* 3.30* 3.43* 3.73*  CALCIUM  8.3*   < > 8.2* 8.0* 8.4* 8.1*  --  7.9* 8.2*  MG 1.5*  --  1.7 1.8  --   --   --  2.1 2.0   < > = values in this interval not displayed.   GFR: Estimated Creatinine Clearance: 17.6 mL/min (A) (by C-G formula based on SCr of 3.73 mg/dL (H)). Recent Labs  Lab 11/22/24 0344 11/22/24 1735 11/22/24 1844 11/23/24 0208  WBC 22.6* 16.8* 16.7* 16.7*    Liver Function Tests: Recent Labs  Lab 11/20/24 0448 11/21/24 0424 11/22/24 0344 11/22/24 1221 11/23/24 0208  AST 137* 289* 231* 154* 93*  ALT 85* 187* 198* 169* 143*  ALKPHOS 320* 540* 590* 582* 450*  BILITOT 3.0* 3.9* 4.2* 3.9* 2.8*  PROT 5.6* 5.8* 5.9* 5.9* 5.7*  ALBUMIN  2.1* 2.0* 2.0* 2.0* 2.0*   Recent Labs  Lab 11/19/24 0917  LIPASE 28   Recent Labs  Lab 11/22/24 1844  AMMONIA 21    ABG    Component Value Date/Time   PHART 7.307 (L) 08/23/2018 1726   PCO2ART 38.1 08/23/2018 1726   PO2ART 89.0 08/23/2018 1726   HCO3 23.7 11/22/2024 1734   TCO2 22 11/22/2024 1505   ACIDBASEDEF 1.8 11/22/2024 1734   O2SAT 49.6 11/22/2024 1734     Coagulation Profile: No results for input(s): INR, PROTIME in the last 168 hours.  Cardiac Enzymes: No results for input(s): CKTOTAL, CKMB, CKMBINDEX, TROPONINI in the last 168 hours.  HbA1C: HbA1c, POC (controlled diabetic range)  Date/Time Value Ref Range Status  08/22/2024 09:44 AM 9.5 (A) 0.0 - 7.0 % Final  02/20/2024 10:15 AM 10.1 (A) 0.0 - 7.0 % Final   Hgb A1c MFr Bld  Date/Time Value Ref Range Status  11/15/2024 11:52 PM 9.6 (H) 4.8 - 5.6 % Final    Comment:    (NOTE) Diagnosis of Diabetes The following HbA1c ranges recommended by the  American Diabetes Association (ADA) may be used as an aid in the diagnosis of diabetes mellitus.  Hemoglobin             Suggested A1C NGSP%              Diagnosis  <5.7                   Non Diabetic  5.7-6.4                Pre-Diabetic  >6.4                   Diabetic  <7.0                   Glycemic control  for                       adults with diabetes.      CBG: Recent Labs  Lab 11/22/24 1756 11/22/24 1927 11/22/24 2304 11/23/24 0307 11/23/24 0722  GLUCAP 230* 213* 239* 209* 262*      Valinda Novas, MD [facility] See Amion for pager If no response to pager, please call [phone] until 7pm After 7pm, Please call E-link [phone]

## 2024-11-23 NOTE — Progress Notes (Signed)
 eLink Physician-Brief Progress Note Patient Name: Colin Keller DOB: 11-07-1956 MRN: [id number]   Date of Service  11/23/2024  HPI/Events of Note  Patient with agitated delirium, wife states he is not a drinker. He has been trying to get out of bed.  eICU Interventions  Zyprexa  2.5 mg IM x 1, Posey restraints.        Terri Malerba U Nary Sneed 11/23/2024, 1:19 AM

## 2024-11-23 NOTE — Plan of Care (Signed)

## 2024-11-24 ENCOUNTER — Encounter (HOSPITAL_COMMUNITY): Payer: Self-pay | Admitting: Internal Medicine

## 2024-11-24 ENCOUNTER — Inpatient Hospital Stay (HOSPITAL_COMMUNITY)

## 2024-11-24 DIAGNOSIS — R933 Abnormal findings on diagnostic imaging of other parts of digestive tract: Secondary | ICD-10-CM | POA: Diagnosis not present

## 2024-11-24 DIAGNOSIS — R748 Abnormal levels of other serum enzymes: Secondary | ICD-10-CM | POA: Diagnosis not present

## 2024-11-24 DIAGNOSIS — F22 Delusional disorders: Secondary | ICD-10-CM

## 2024-11-24 DIAGNOSIS — K831 Obstruction of bile duct: Secondary | ICD-10-CM | POA: Diagnosis not present

## 2024-11-24 DIAGNOSIS — I2699 Other pulmonary embolism without acute cor pulmonale: Secondary | ICD-10-CM | POA: Diagnosis not present

## 2024-11-24 LAB — CBC
HCT: 23.4 % — ABNORMAL LOW (ref 39.0–52.0)
Hemoglobin: 8.3 g/dL — ABNORMAL LOW (ref 13.0–17.0)
MCH: 31.8 pg (ref 26.0–34.0)
MCHC: 35.5 g/dL (ref 30.0–36.0)
MCV: 89.7 fL (ref 80.0–100.0)
Platelets: 276 K/uL (ref 150–400)
RBC: 2.61 MIL/uL — ABNORMAL LOW (ref 4.22–5.81)
RDW: 15.3 % (ref 11.5–15.5)
WBC: 18.8 K/uL — ABNORMAL HIGH (ref 4.0–10.5)
nRBC: 0 % (ref 0.0–0.2)

## 2024-11-24 LAB — BASIC METABOLIC PANEL WITH GFR
Anion gap: 15 (ref 5–15)
BUN: 74 mg/dL — ABNORMAL HIGH (ref 8–23)
CO2: 23 mmol/L (ref 22–32)
Calcium: 8 mg/dL — ABNORMAL LOW (ref 8.9–10.3)
Chloride: 94 mmol/L — ABNORMAL LOW (ref 98–111)
Creatinine, Ser: 4.09 mg/dL — ABNORMAL HIGH (ref 0.61–1.24)
GFR, Estimated: 15 mL/min — ABNORMAL LOW (ref >60)
Glucose, Bld: 214 mg/dL — ABNORMAL HIGH (ref 70–99)
Potassium: 3.5 mmol/L (ref 3.5–5.1)
Sodium: 132 mmol/L — ABNORMAL LOW (ref 135–145)

## 2024-11-24 LAB — HEPATIC FUNCTION PANEL
ALT: 211 U/L — ABNORMAL HIGH (ref 0–44)
AST: 347 U/L — ABNORMAL HIGH (ref 15–41)
Albumin: 1.9 g/dL — ABNORMAL LOW (ref 3.5–5.0)
Alkaline Phosphatase: 1158 U/L — ABNORMAL HIGH (ref 38–126)
Bilirubin, Direct: 1.8 mg/dL — ABNORMAL HIGH (ref 0.0–0.2)
Indirect Bilirubin: 1.4 mg/dL — ABNORMAL HIGH (ref 0.3–0.9)
Total Bilirubin: 3.2 mg/dL — ABNORMAL HIGH (ref 0.0–1.2)
Total Protein: 6 g/dL — ABNORMAL LOW (ref 6.5–8.1)

## 2024-11-24 LAB — GLUCOSE, CAPILLARY
Glucose-Capillary: 150 mg/dL — ABNORMAL HIGH (ref 70–99)
Glucose-Capillary: 186 mg/dL — ABNORMAL HIGH (ref 70–99)
Glucose-Capillary: 242 mg/dL — ABNORMAL HIGH (ref 70–99)
Glucose-Capillary: 257 mg/dL — ABNORMAL HIGH (ref 70–99)
Glucose-Capillary: 261 mg/dL — ABNORMAL HIGH (ref 70–99)
Glucose-Capillary: 292 mg/dL — ABNORMAL HIGH (ref 70–99)

## 2024-11-24 LAB — URINALYSIS, ROUTINE W REFLEX MICROSCOPIC
Bilirubin Urine: NEGATIVE
Glucose, UA: 50 mg/dL — AB
Ketones, ur: NEGATIVE mg/dL
Leukocytes,Ua: NEGATIVE
Nitrite: NEGATIVE
Protein, ur: >=300 mg/dL — AB
Specific Gravity, Urine: 1.015 (ref 1.005–1.030)
pH: 5 (ref 5.0–8.0)

## 2024-11-24 LAB — CREATININE, URINE, RANDOM: Creatinine, Urine: 56 mg/dL

## 2024-11-24 LAB — SODIUM, URINE, RANDOM: Sodium, Ur: 30 mmol/L

## 2024-11-24 MED ORDER — FENTANYL CITRATE (PF) 100 MCG/2ML IJ SOLN
INTRAMUSCULAR | Status: AC
  Filled 2024-11-24: qty 2

## 2024-11-24 MED ORDER — LIDOCAINE-EPINEPHRINE 1 %-1:100000 IJ SOLN
20.0000 mL | Freq: Once | INTRAMUSCULAR | Status: AC
  Administered 2024-11-24: 20 mL via INTRADERMAL
  Filled 2024-11-24: qty 20

## 2024-11-24 MED ORDER — FENTANYL CITRATE (PF) 100 MCG/2ML IJ SOLN
INTRAMUSCULAR | Status: AC | PRN
  Administered 2024-11-24 (×2): 50 ug via INTRAVENOUS

## 2024-11-24 MED ORDER — LIDOCAINE-EPINEPHRINE 1 %-1:100000 IJ SOLN
INTRAMUSCULAR | Status: AC
  Filled 2024-11-24: qty 1

## 2024-11-24 MED ORDER — IOHEXOL 300 MG/ML  SOLN
50.0000 mL | Freq: Once | INTRAMUSCULAR | Status: AC | PRN
  Administered 2024-11-24: 5 mL

## 2024-11-24 MED ORDER — POTASSIUM CHLORIDE CRYS ER 20 MEQ PO TBCR
20.0000 meq | EXTENDED_RELEASE_TABLET | Freq: Once | ORAL | Status: AC
  Administered 2024-11-24: 20 meq via ORAL
  Filled 2024-11-24: qty 1

## 2024-11-24 MED ORDER — OXYCODONE HCL 5 MG PO TABS
2.5000 mg | ORAL_TABLET | Freq: Four times a day (QID) | ORAL | Status: DC | PRN
  Administered 2024-11-24 – 2024-11-25 (×2): 2.5 mg via ORAL
  Filled 2024-11-24 (×2): qty 1

## 2024-11-24 MED ORDER — MIDAZOLAM HCL 2 MG/2ML IJ SOLN
INTRAMUSCULAR | Status: AC
  Filled 2024-11-24: qty 2

## 2024-11-24 MED ORDER — SODIUM CHLORIDE 0.9 % IV SOLN
2.0000 g | INTRAVENOUS | Status: DC
  Administered 2024-11-24 – 2024-11-25 (×2): 2 g via INTRAVENOUS
  Filled 2024-11-24 (×2): qty 20

## 2024-11-24 MED ORDER — INSULIN GLARGINE 100 UNIT/ML ~~LOC~~ SOLN
10.0000 [IU] | Freq: Every day | SUBCUTANEOUS | Status: DC
  Administered 2024-11-24 – 2024-11-25 (×2): 10 [IU] via SUBCUTANEOUS
  Filled 2024-11-24 (×2): qty 0.1

## 2024-11-24 MED ORDER — MIDAZOLAM HCL (PF) 2 MG/2ML IJ SOLN
INTRAMUSCULAR | Status: AC | PRN
  Administered 2024-11-24 (×2): 1 mg via INTRAVENOUS

## 2024-11-24 MED ORDER — HEPARIN (PORCINE) 25000 UT/250ML-% IV SOLN
1050.0000 [IU]/h | INTRAVENOUS | Status: AC
  Administered 2024-11-24 – 2024-11-25 (×2): 1050 [IU]/h via INTRAVENOUS
  Filled 2024-11-24 (×2): qty 250

## 2024-11-24 MED ORDER — LACTATED RINGERS IV SOLN: INTRAVENOUS | Status: AC

## 2024-11-24 MED ORDER — METRONIDAZOLE 500 MG/100ML IV SOLN
500.0000 mg | Freq: Two times a day (BID) | INTRAVENOUS | Status: DC
  Administered 2024-11-24 – 2024-11-25 (×2): 500 mg via INTRAVENOUS
  Filled 2024-11-24 (×2): qty 100

## 2024-11-24 NOTE — Assessment & Plan Note (Addendum)
 VSS. Patient endorsing continued abdominal pain. Worsened delirium overnight requiring restraints. LFTs and bilirubin uptrending. WBCs uptrending vs prior.  - Pain regimen: Changed to oxycodone  2.5mg  q6 prn, d/c'd Dilaudid  given concern for delirium - Bowel regimen w/ miralax  bid and senna daily - Zofran  4mg  q6 prn for nausea - Maalox q6 prn, caution w/ aki - Continue IV zosyn  for concern for acute cholecystitis -GI consulted, appreciate their recs -Gen Surg consulted, appreciate recs  Both of the above services recommend IR consult for percutaneous cholecystostomy tube placement -Consult IR -AM CBC, CMP

## 2024-11-24 NOTE — Assessment & Plan Note (Addendum)
 Cr continues to uptrend day over day, now up to 4.09. On Zosyn  for acute infection. Urinating appropriately.  - Resume maintenance IVF with 100 mL/h LR - holding home lasix , hydral, hydrochlorothiazide  - AM CMP - Nephro consulted   - IR procedure for acute cholecystitis involves contrast, requiring nephro approval in the setting of this AKI

## 2024-11-24 NOTE — Progress Notes (Signed)
 eLink Physician-Brief Progress Note Patient Name: Colin Keller DOB: [birth year] MRN: [id number]   Date of Service  11/24/2024  HPI/Events of Note  AM K+ 3.6 with GFR 15    able to take po meds  eICU Interventions  K 20 meqs x 1 ordered     Intervention Category Intermediate Interventions: Electrolyte abnormality - evaluation and management  Damien ONEIDA Grout 11/24/2024, 4:49 AM

## 2024-11-24 NOTE — Assessment & Plan Note (Addendum)
 HTN/CAD/CHF: hold home Coreg , diltiazem  BPH: Continue home tamsulosin  GERD: Continue home Protonix 

## 2024-11-24 NOTE — Progress Notes (Signed)
 eLink Physician-Brief Progress Note Patient Name: Colin Keller DOB: 14-Dec-1955 MRN: [id number]   Date of Service  11/24/2024  HPI/Events of Note  Received request for Posey belt Seen on camera restless and is at risk for fall He remains critically ill  eICU Interventions  Soft waist belt ordered Bedside team to assess in am if restraints to be continued     Intervention Category Minor Interventions: Agitation / anxiety - evaluation and management  Damien ONEIDA Grout 11/24/2024, 1:36 AM

## 2024-11-24 NOTE — Assessment & Plan Note (Addendum)
 BG persistently elevated > 200 last 24 hours. Received 15u LAI total 12/13, 5u at 2100.  - Increase LAI to 10u daily - Change SSI to moderate  - Hold home metformin 

## 2024-11-24 NOTE — Plan of Care (Signed)
 FMTS Interim Progress Note  S: Seen patient at bedside postop from percutaneous cholecystotomy.  Patient's pain is overall improved.  Wife is at bedside and also agrees he looks less jaundiced than before.  She expresses multiple concerns regarding communication amongst her family and the medical team.  O: BP (!) 146/90 (BP Location: Right Arm)   Pulse 83   Temp 98 F (36.7 C) (Oral)   Resp 17   Ht 5' 6 (1.676 m) Comment: Simultaneous filing. User may not have seen previous data.  Wt 64.6 kg Comment: Simultaneous filing. User may not have seen previous data.  SpO2 95%   BMI 22.99 kg/m   General: Lying in bed, no acute distress, resting comfortably Cardiologic: Regular rate Pulmonary: Breathing speaking comfortably on room air Abdominal: Percutaneous tube without excessive drainage around insertion site though with good amount of biliary drainage in suction bulb Dermatologic: Jaundice of face and bilateral upper extremities  A/P: Concern for cholecystitis with cholangitis Status post percutaneous cholecystotomy, GI continues to follow.  Continue monitoring biliary drainage.  Continue antibiotics.  Continue pain control given this is well-controlled.  Will chat with team about communication concerns in the AM.  Tharon Lung, MD 11/24/2024, 11:08 PM PGY-3, [facility] Service pager [phone]

## 2024-11-24 NOTE — Consult Note (Signed)
 Renal Service Consult Note [facility] Lamar JONETTA Fret, MD  Patient: Colin Keller Date: 11/24/2024 Requesting Physician: Dr. KYM Daring  Reason for Consult: Renal failure HPI: The patient is a 68 y.o. year-old w/ PMH as below who presented to ED on 12/09 c/o abdominal pain. Had been just dc'd on 12/07 after admit for acute PE. In ED BP was 170/ 62, HR 65, RR 16, temp 98. 100% sats on RA. Labs showed K+ 3.9, BUN 40, creat 2.8, Ca 8.4, alb 2.5, AST 51/ ALT 35, tbili 1.1, wBC 12k, Hb 8.7. UA showed 6-10 rbc/wbc, prot > 300. CT showed gallbladder to have thickened walls circumferentially with some edema in the abdomen adjacent to the gallbladder. Imaging features raise the question of acute cholecystitis.  IV abx were started. Other imaging was done. Pt was seen by gen surgery and GI and both recommended IR to see for perc cholecystostomy. Pt went for IR the procedure today 12/14. Since admission creat was 2.8, but the last 48 hrs has worsened up to 3.7 yesterday and 4.0 today. UOP recorded yest was 1.3 L which was more than prior days (600- 900cc/d). K= 3.5, CO2 23 and alb 1.9.  We are asked to see for renal failure.    Pt seen in room. Pt is post-procedure and sedated. No hx obtained. Family states he has a kidney doctor, not sure the name. They know he is stage 3, not sure a vs b.     ROS - n/a    Past Medical History  Past Medical History:  Diagnosis Date   Acute diastolic heart failure (HCC) 08/18/2018   AKI (acute kidney injury) 10/04/2023   Atherosclerosis of both carotid arteries 08/18/2020   Carotid dopplers 08/2019 40-59% bilateral stenosis and right subclavian stenosis   Bilateral carotid artery stenosis 08/18/2020   Right Carotid: Velocities in the right ICA are consistent with a 60-79%                 stenosis. Non-hemodynamically significant plaque <50% noted in                 the CCA. The ECA appears >50% stenosed. Proximal subclavian                 artery  dilatation with elevated and turbulent flow just past the                 narrowing, measuring 1.6 cm.   Left Carotid: Velocities in the left ICA are    CHF (congestive heart failure) (HCC)    Chronic congestive heart failure (HCC) 10/04/2023   Transthoracic echocardiogram 10/08/2018: LVEF is approximately 50% with hypokinesis of the lateral (base/mid) and basal inferior walls T     Chronic left shoulder pain 03/18/2021   Chronic low back pain without sciatica 10/16/2008   H/o chronic LBP with h/o lumbar disc herniation and intermittent radicular pain  Chronic pain, on stable doses of Norco 10/325 TID. MRI in 2003 showing SMALL CENTRAL DISC HERNIATION AT L4-5.  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      CKD (chronic kidney disease), stage III (HCC)    Coronary artery disease    Coronary artery disease involving native heart with angina pectoris, unspecified vessel or lesion type 08/10/2023   COVID 10/04/2023   Diabetes mellitus    DKA (diabetic ketoacidosis) (HCC) 10/03/2023   Dyspnea    Fall 10/04/2023   GERD (gastroesophageal reflux disease)  Hx of CABG 08/23/2018   LIMA to LAD, RIMA to RCA, SVG to OM3, EVH via right thigh   Hyperlipidemia    Hypertension    Hypertension associated with diabetes (HCC) 12/22/2008   Qualifier: Diagnosis of  By: Edrick MD, Susan     Hypertensive nephropathy 08/18/2020   Insulin  dependent type 2 diabetes mellitus (HCC) 12/22/2020   Left ventricular dysfunction 08/18/2020   TTE ([id number]): LVEF is approximately 50% with hypokinesis of the lateral (base/mid) and basal inferior walls The cavity size was normal.      Long term (current) use of antithrombotics/antiplatelets 09/21/2022   Planned 65-months DAPT for DES for PAD on 09/21/2022.  Planned 6 months therapy. End 03/23/23.   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   LIMA to LAD, RIMA to RCA, SVG to OM3, EVH via right thigh   Sleeping difficulties 09/08/2019   Smoking greater than 20 pack years 08/22/2024   Subclavian artery stenosis, right 08/18/2020   Carotid dopplers 08/2019 40-59% bilateral stenosis and right subclavian stenosis   Tobacco abuse    Tobacco abuse disorder    Qualifier: Diagnosis of  By: Edrick MD, Devere     Past Surgical History  Past Surgical History:  Procedure Laterality Date   ABDOMINAL AORTOGRAM W/LOWER EXTREMITY N/A 09/21/2022   Procedure: ABDOMINAL AORTOGRAM W/LOWER EXTREMITY;  Surgeon: Darron Deatrice LABOR, MD;  Location: [facility] INVASIVE CV LAB;  Service: Cardiovascular;  Laterality: N/A;   CORONARY ARTERY BYPASS GRAFT N/A 08/23/2018   Procedure: CORONARY ARTERY BYPASS GRAFTING (CABG) x 3; -Left Internal Mammary Artery to Left Anterior Descending Artery, -Right Internal Mammary Artery to Right Coronary Artery, -Saphenous Vein Graft to Obtuse Marginal;  ENDOSCOPIC HARVEST GREATER SAPHENOUS VEIN  -Right Thigh;  Surgeon: Dusty Sudie DEL, MD;  Location: [facility] OR;  Service: Open Heart Surgery;  Laterality: N/A;   CORONARY PRESSURE/FFR STUDY N/A 08/20/2018   Procedure: INTRAVASCULAR PRESSURE WIRE/FFR STUDY;  Surgeon: Wonda Sharper, MD;  Location: [facility] INVASIVE CV LAB;  Service: Cardiovascular;  Laterality: N/A;   LEFT HEART CATH AND CORONARY ANGIOGRAPHY N/A 08/20/2018   Procedure: LEFT HEART CATH AND CORONARY ANGIOGRAPHY;  Surgeon: Wonda Sharper, MD;  Location: [facility] INVASIVE CV LAB;  Service: Cardiovascular;  Laterality: N/A;   PERIPHERAL VASCULAR ATHERECTOMY Left 09/21/2022   Procedure: PERIPHERAL  VASCULAR ATHERECTOMY;  Surgeon: Darron Deatrice LABOR, MD;  Location: [facility] INVASIVE CV LAB;  Service: Cardiovascular;  Laterality: Left;  SFA   PERIPHERAL VASCULAR BALLOON ANGIOPLASTY Left 09/21/2022   Procedure: PERIPHERAL VASCULAR BALLOON ANGIOPLASTY;  Surgeon: Darron Deatrice LABOR, MD;  Location: [facility] INVASIVE CV LAB;  Service: Cardiovascular;  Laterality: Left;  SFA   TEE WITHOUT CARDIOVERSION N/A 08/23/2018   Procedure: TRANSESOPHAGEAL ECHOCARDIOGRAM (TEE);  Surgeon: Dusty Sudie DEL, MD;  Location: [facility] OR;  Service: Open Heart  Surgery;  Laterality: N/A;   Family History  Family History  Problem Relation Age of Onset   Heart disease Mother    Diabetes Mother    Heart failure Mother    Heart disease Father    Sudden death Brother    Drug abuse Son    Social History  reports that he has been smoking cigarettes. He started smoking about 54 years ago. He has a 48 pack-year smoking history. He has never used smokeless tobacco. He reports that he does not drink alcohol  and does not use drugs. Allergies Allergies[1] Home medications Prior to Admission medications  Medication Sig Start Date End Date Taking? Authorizing Provider  APIXABAN  (ELIQUIS ) VTE STARTER PACK (10MG  AND 5MG ) Take as directed on package: start with two-5mg  tablets twice daily for 7 days. On day 8, switch to one-5mg  tablet twice daily. 11/16/24  Yes Gherghe, Costin M, MD  aspirin  81 MG tablet Take 81 mg by mouth daily.   Yes [provider]  carvedilol  (COREG ) 6.25 MG tablet Take 1 tablet (6.25 mg total) by mouth 2 (two) times daily with a meal. 11/17/24  Yes Elicia Hamlet, MD  cetirizine (ZYRTEC) 10 MG chewable tablet Chew 10 mg by mouth as needed for allergies.   Yes [provider]  diltiazem  (CARDIZEM  CD) 180 MG 24 hr capsule Take 180 mg by mouth daily. 10/29/24  Yes [provider]  Evolocumab  (REPATHA  SURECLICK) 140 MG/ML SOAJ Inject 140 mg into the skin every 14 (fourteen) days. 03/12/24  Yes Darron Deatrice LABOR, MD  furosemide  (LASIX ) 40 MG tablet Take 0.5 tablets (20 mg total) by mouth daily. 11/18/24  Yes Elicia Hamlet, MD  Glucagon  (BAQSIMI  TWO PACK) 3 MG/DOSE POWD Use as directed PRN low glucose 10/30/24  Yes McDiarmid, Krystal BIRCH, MD  hydrALAZINE  (APRESOLINE ) 50 MG tablet Take 1 tablet (50 mg total) by mouth 3 (three) times daily. 08/22/24 08/17/25 Yes McDiarmid, Krystal BIRCH, MD  hydrochlorothiazide  (HYDRODIURIL ) 12.5 MG tablet Take 1 tablet (12.5 mg total) by mouth daily. 10/30/24  Yes McDiarmid, Krystal BIRCH, MD  HYDROcodone -acetaminophen  (NORCO) 10-325 MG tablet TAKE 1 TABLET BY MOUTH TWICE DAILY AS NEEDED Patient taking differently: Take 1 tablet by mouth in the morning and at bedtime. 11/06/24  Yes McDiarmid, Krystal BIRCH, MD  Insulin  Glargine (BASAGLAR  KWIKPEN) 100 UNIT/ML DIAL AND INJECT 20 UNITS UNDER THE SKIN DAILY. Patient taking differently: Inject 10-12 Units into the skin daily. 08/19/24  Yes McDiarmid, Krystal BIRCH, MD  insulin  lispro (HUMALOG ) 100 UNIT/ML injection Inject 0.03-0.05 mLs (3-5 Units total) into the skin 3 (three) times daily with meals. 3-4 units prior to breakfast, 4-5 units prior to evening meal 08/08/24 02/04/25 Yes McDiarmid, Krystal BIRCH, MD  metFORMIN  (GLUCOPHAGE ) 500 MG tablet Take 2 tablets (1,000 mg total) by mouth 2 (two) times daily with a meal. TAKE 2 TABLETS BY MOUTH TWICE DAILY WITH A MEAL 10/23/23 11/15/25 Yes McDiarmid, Krystal BIRCH, MD  pantoprazole  (PROTONIX ) 40 MG tablet Take 1 tablet (40 mg total) by mouth daily. 11/18/24  Yes Elicia Hamlet, MD  promethazine  (PHENERGAN ) 25 MG tablet Take 1 tablet (25 mg total) by mouth every 6 (six) hours as needed for nausea or vomiting. Patient taking differently: Take 12.5-25 mg by mouth every 6 (six) hours as needed for nausea or vomiting. 09/24/24  Yes McDiarmid, Krystal BIRCH, MD  tamsulosin  (FLOMAX ) 0.4 MG CAPS capsule Take 1 capsule (0.4 mg total) by mouth daily. 02/20/24  Yes McDiarmid, Krystal BIRCH, MD  apixaban  (ELIQUIS ) 5 MG  TABS tablet Take 1 tablet (5 mg total) by mouth  2 (two) times daily. To begin after completion of Eliquis  (apixaban ) VTE starter pack Patient not taking: Reported on 11/19/2024 12/17/24   Trixie Nilda HERO, MD     Vitals:   11/24/24 1615 11/24/24 1620 11/24/24 1625 11/24/24 1630  BP:   (!) 173/97 (!) 169/89  Pulse: 78 80 82 82  Resp:   18 18  Temp:      TempSrc:      SpO2: 96% 98% 98% 99%  Weight:      Height:       Exam Gen alert, no distress, elderly man, sedated post procedure Sclera anicteric, throat clear  No jvd or bruits, flat neck veins Chest clear bilat to bases RRR no MRG Abd soft ntnd no mass or ascites +bs Ext no LE or UE edema, no other edema Neuro is alert, Ox 3 , nf   Home bp meds: Coreg   Lasix  Cardizem  Hydralazine  HCTZ    Date   Creat  eGFR (ml/min) 2009- 2016  1.04- 1.62 2017   1.37- 1.57 2019   0.90- 1.96 2020   1.61- 2.17 2021   1.40- 1.89 2022   1.45- 1.53 2023   1.40- 1.63 May 2024  1.71- 1.98 Oct 2024  2.58 >> 1.34 July 2025  1.92- 2.21 31- 37 ml/min 10/30/24  2.06 12/05- 11/17/24 2.10- 2.46 12/09   3.02  21 ml/min 12/10   2.80  24 12/11   2.51  27 12/12   2.95  22 12/13   3.73  17   11/24/24  4.09  15 ml/min   BP: no hypotension since admission I/O: 6.8 L in and 3.2 L out = +3.6 L  IP meds of interest:  Coreg  6.25 bid, from 12/10-12/12 Cardizem  180 every day from 12/09- 12/12 Hydralazine  50mg  tid, started 12/13 ED LR 1 L bolus IV zosyn  12/09, lowered to 2.25 gm q 6 hrs on 12/13 IV hydralazine  10mg  prn  12/05 - > Omnipaque  w/ CTA chest, 60 cc used 12/12 - > Omnipaque  75 cc given during ercp procedure 12/14 - > Omnipaque  5 ml given in IR   UA - 12/09, glu >500, prot > 300, 6-10 rbc/ wbc, 0-5 epi CT abd / pelvis 12/09: Urinary Tract: both kidneys are heterogeneous in appearance. Probable punctate stones bilaterally. No hydronephrosis. No hydroureter. The urinary bladder appears normal for the degree of distention. CXR 12/05 -> no active disease Labs today --> K+ 3.5, N1  132, BUN 74, creat 4.09, alb 1.9  Assessment/ Plan:   AKI on CKD 3b: b/l creatinine is 1.9- 2.2 from July 2025, eGFR 31- 37 ml/min. Creat was 2.1- 2.4 from earlier admit 12/05- 12/07. Creat on admit here 12/09 was 3.0, then dropped to 2.5, and now is back up to 3.7 yesterday and 4.0 today.  UOP has been good. Wt's aren't taken. Pt got IV contrast twice, once on 12/05 (60 cc) and again on 12/12 (75 cc). No hypotension, BP's are great. No obstruction on initial CT, will get new renal US . Last UA showed ++protein, mild rbcs/ wbcs, will repeat. Looks a bit dry, will cont IVFs (LR) at 85 cc/hr. Creat bumped after 75 cc contrast on 12/12, however this contrast was used during an ERCP w/ biliary sphincterotomy and CBD stent placement, and therefore the contrast is not going directly into the venous circulation. Another possibility is zosyn -induced AKI. Will ask pharm/ pmd to find a new antibiotic substitute. Bladder  scan tonight was 35 cc, but will place foley anyway so we can monitor UOP in this pt w/ advanced AKI on CKD. Get renal US , repeat UA, get UNa/ UCr. Have d/w family. Will follow.     Myer Fret  MD [facility] 11/24/2024, 4:54 PM  Recent Labs  Lab 11/23/24 0208 11/24/24 0130  CREATININE 3.73* 4.09*  K 3.7 3.5   Inpatient medications:  aspirin  EC  81 mg Oral Daily   Chlorhexidine  Gluconate Cloth  6 each Topical Daily   feeding supplement  1 Container Oral TID BM   hydrALAZINE   50 mg Oral Q8H   indomethacin   100 mg Rectal Once   insulin  aspart  0-15 Units Subcutaneous Q4H   insulin  glargine  10 Units Subcutaneous Daily   isosorbide  mononitrate  60 mg Oral Daily   lidocaine   3 patch Transdermal Q24H   nicotine   7 mg Transdermal Daily   pantoprazole   40 mg Oral Daily   polyethylene glycol  17 g Oral Daily   senna  1 tablet Oral Daily   tamsulosin   0.4 mg Oral Daily    lactated ringers  Stopped (11/24/24 1558)   piperacillin -tazobactam (ZOSYN )  IV Stopped (11/24/24 1532)   alum & mag  hydroxide-simeth, melatonin, ondansetron  (ZOFRAN ) IV, oxyCODONE , polyethylene glycol      [1]  Allergies Allergen Reactions   Amlodipine  Other (See Comments)    Leg swelling   Empagliflozin  Other (See Comments)    Euglycemic DKA   Amitriptyline  Other (See Comments)    Urinary retention   Lisinopril  Other (See Comments)    Hyperkalemia    Nortriptyline  Hcl Other (See Comments)    Vivid / bad dreams   Statins     Aches in Joints    Simvastatin  Other (See Comments)    Arthralgias

## 2024-11-24 NOTE — Consult Note (Signed)
 Chief Complaint: Patient was seen in consultation today for cholecystitis.  Referring Physician(s): Delores Fought, MD (FMTS)  Supervising Physician: Jennefer Rover  Patient Status: [facility] - In-pt  History of Present Illness: Colin Keller is a 68 y.o. male with a past medical history significant for carotid artery stenosis, CHF, CAD s/p CABG x 3, HTN, HLD, PAD, PE, CKD III, DM who initially presented to the ED on 11/15/24 with concern for dyspnea and possible ketoacidosis. On evaluation he was found to have HTN urgency, leukocytosis, anemia, AKI. He underwent CTA chest which noted acute PE - he was admitted and started on heparin  gtt, ultimately being discharged on Eliquis  on 12/7.  He returned to the ED on 12/9 with complaints of abdominal pain (RUQ) which had been present for several weeks but recently worsened. He underwent CT abd/pelvis w/o contrast which showed:  1. Gallbladder lumen is filled with high density material, likely vicarious excretion of intravenous contrast administered for chest CTA 11/15/2024. Gallbladder wall appears circumferentially thickened with small volume confluent edema in the right upper quadrant of the abdomen adjacent to the gallbladder. Imaging features raise the question of acute cholecystitis. Right upper quadrant ultrasound may prove helpful. 2. Intrahepatic biliary duct dilatation. Common bile duct not well demonstrated on this noncontrast exam. Correlation with liver function test recommended. 3. Soft tissue nodularity in the expected location of the left adrenal gland may reflect nodular thickening, small lymph nodes, or vascular anatomy. This is not well evaluated given lack of intravenous contrast material. 4. Possible borderline lymphadenopathy in the left para-aortic space. 5.  Aortic Atherosclerosis (ICD10-I70.0).  RUQ US  showed:  1. Gallbladder wall thickening (7 mm) without focal tenderness. 2. Dilated common bile duct (13.5 mm)  without intrahepatic biliary ductal dilatation.  He was admitted and underwent MRCP which noted diffuse biliary ductal dilatation and distal CBD stricture without evidence of choledocholithiasis. HIDA scan was also attempted but was non diagnostic. He underwent ERCP with GI on 12/12 which showed distal biliary stricture 3 cm on unclear etiology, plastic stent was placed. Post procedure he was unresponsive with bradycardia and hypotension which gradually improved with medications, however he has remained confused and intermittently agitated. Since the procedure he has developed worsening RUQ pain, nausea and reported emesis with worsening leukocytosis. He is not currently a candidate for cholecystectomy and due to this IR has been consulted for percutaneous cholecystostomy placement.  Presented to patient's room on 34M, however he is sleeping and his wife asks that I do not disturb him as he has had a lot of trouble sleeping. We spoke in the hallway and she notes that he has had occasional abdominal pain for awhile but this has recently worsened a lot in the last 24 hours or so, she was told he vomited but he denies this. He has been nauseous. He has been quite agitated and delirious, his daughter was staying with him the other night and he was very mean to her which is completely out of character for him. His pain seems to have improved significantly with recent oxycodone  administration. We discussed percutaneous cholecystostomy indications and risks, as well as need for tube to remain in place for 6+ weeks. She states understanding and is agreeable to proceed.  Past Medical History:  Diagnosis Date   Acute diastolic heart failure (HCC) 08/18/2018   AKI (acute kidney injury) 10/04/2023   Atherosclerosis of both carotid arteries 08/18/2020   Carotid dopplers 08/2019 40-59% bilateral stenosis and right subclavian stenosis   Bilateral  carotid artery stenosis 08/18/2020   Right Carotid: Velocities in the  right ICA are consistent with a 60-79%                 stenosis. Non-hemodynamically significant plaque <50% noted in                 the CCA. The ECA appears >50% stenosed. Proximal subclavian                 artery dilatation with elevated and turbulent flow just past the                 narrowing, measuring 1.6 cm.   Left Carotid: Velocities in the left ICA are    CHF (congestive heart failure) (HCC)    Chronic congestive heart failure (HCC) 10/04/2023   Transthoracic echocardiogram 10/08/2018: LVEF is approximately 50% with hypokinesis of the lateral (base/mid) and basal inferior walls T     Chronic left shoulder pain 03/18/2021   Chronic low back pain without sciatica 10/16/2008   H/o chronic LBP with h/o lumbar disc herniation and intermittent radicular pain  Chronic pain, on stable doses of Norco 10/325 TID. MRI in 2003 showing SMALL CENTRAL DISC HERNIATION AT L4-5.  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      CKD (chronic kidney disease), stage III (HCC)    Coronary artery disease    Coronary artery disease involving native heart with angina pectoris, unspecified vessel or lesion type 08/10/2023   COVID 10/04/2023   Diabetes mellitus    DKA (diabetic ketoacidosis) (HCC) 10/03/2023   Dyspnea    Fall 10/04/2023   GERD (gastroesophageal reflux disease)    Hx of CABG 08/23/2018   LIMA to LAD, RIMA to RCA, SVG to OM3, EVH via right thigh   Hyperlipidemia    Hypertension    Hypertension associated with diabetes (HCC) 12/22/2008   Qualifier: Diagnosis of  By: Edrick MD, Susan     Hypertensive nephropathy 08/18/2020   Insulin  dependent type 2 diabetes mellitus (HCC) 12/22/2020   Left ventricular dysfunction 08/18/2020   TTE ([id number]): LVEF is approximately 50% with hypokinesis of the lateral (base/mid) and basal inferior walls The cavity size was normal.      Long term (current) use of antithrombotics/antiplatelets 09/21/2022   Planned 48-months DAPT for DES  for PAD on 09/21/2022.  Planned 6 months therapy. End 03/23/23.   Peripheral artery disease 08/31/2021   [facility] Vascular Study lower extremities 08/30/21:            Today's ABI  Today's TBI   Previous ABIPrevious TBI  +-------+-----------+-----------+------------+------------+  Right  0.76       0.31       1.03                      +-------+-----------+-----------+------------+------------+  Left   0.63       0.20       0.95                      +-------+-----------+---------   Proteinuria 08/18/2020   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   LIMA to LAD, RIMA to RCA, SVG to OM3, EVH via right thigh   Sleeping difficulties 09/08/2019   Smoking greater than 20 pack years 08/22/2024   Subclavian artery stenosis, right 08/18/2020   Carotid dopplers 08/2019 40-59% bilateral stenosis and right subclavian stenosis   Tobacco abuse    Tobacco  abuse disorder    Qualifier: Diagnosis of  By: Edrick MD, Devere      Past Surgical History:  Procedure Laterality Date   ABDOMINAL AORTOGRAM W/LOWER EXTREMITY N/A 09/21/2022   Procedure: ABDOMINAL AORTOGRAM W/LOWER EXTREMITY;  Surgeon: Darron Deatrice LABOR, MD;  Location: [facility] INVASIVE CV LAB;  Service: Cardiovascular;  Laterality: N/A;   CORONARY ARTERY BYPASS GRAFT N/A 08/23/2018   Procedure: CORONARY ARTERY BYPASS GRAFTING (CABG) x 3; -Left Internal Mammary Artery to Left Anterior Descending Artery, -Right Internal Mammary Artery to Right Coronary Artery, -Saphenous Vein Graft to Obtuse Marginal;  ENDOSCOPIC HARVEST GREATER SAPHENOUS VEIN  -Right Thigh;  Surgeon: Dusty Sudie DEL, MD;  Location: [facility] OR;  Service: Open Heart Surgery;  Laterality: N/A;   CORONARY PRESSURE/FFR STUDY N/A 08/20/2018   Procedure: INTRAVASCULAR PRESSURE WIRE/FFR STUDY;  Surgeon: Wonda Sharper, MD;  Location: [facility] INVASIVE CV LAB;  Service: Cardiovascular;  Laterality: N/A;   LEFT HEART CATH AND CORONARY ANGIOGRAPHY N/A 08/20/2018   Procedure: LEFT HEART CATH AND CORONARY  ANGIOGRAPHY;  Surgeon: Wonda Sharper, MD;  Location: [facility] INVASIVE CV LAB;  Service: Cardiovascular;  Laterality: N/A;   PERIPHERAL VASCULAR ATHERECTOMY Left 09/21/2022   Procedure: PERIPHERAL VASCULAR ATHERECTOMY;  Surgeon: Darron Deatrice LABOR, MD;  Location: [facility] INVASIVE CV LAB;  Service: Cardiovascular;  Laterality: Left;  SFA   PERIPHERAL VASCULAR BALLOON ANGIOPLASTY Left 09/21/2022   Procedure: PERIPHERAL VASCULAR BALLOON ANGIOPLASTY;  Surgeon: Darron Deatrice LABOR, MD;  Location: [facility] INVASIVE CV LAB;  Service: Cardiovascular;  Laterality: Left;  SFA   TEE WITHOUT CARDIOVERSION N/A 08/23/2018   Procedure: TRANSESOPHAGEAL ECHOCARDIOGRAM (TEE);  Surgeon: Dusty Sudie DEL, MD;  Location: [facility] OR;  Service: Open Heart Surgery;  Laterality: N/A;    Allergies: Amlodipine , Empagliflozin , Amitriptyline , Lisinopril , Nortriptyline  hcl, Statins, and Simvastatin   Medications: Prior to Admission medications  Medication Sig Start Date End Date Taking? Authorizing Provider  APIXABAN  (ELIQUIS ) VTE STARTER PACK (10MG  AND 5MG ) Take as directed on package: start with two-5mg  tablets twice daily for 7 days. On day 8, switch to one-5mg  tablet twice daily. 11/16/24  Yes Gherghe, Costin M, MD  aspirin  81 MG tablet Take 81 mg by mouth daily.   Yes [provider]  carvedilol  (COREG ) 6.25 MG tablet Take 1 tablet (6.25 mg total) by mouth 2 (two) times daily with a meal. 11/17/24  Yes Elicia Hamlet, MD  cetirizine (ZYRTEC) 10 MG chewable tablet Chew 10 mg by mouth as needed for allergies.   Yes [provider]  diltiazem  (CARDIZEM  CD) 180 MG 24 hr capsule Take 180 mg by mouth daily. 10/29/24  Yes [provider]  Evolocumab  (REPATHA  SURECLICK) 140 MG/ML SOAJ Inject 140 mg into the skin every 14 (fourteen) days. 03/12/24  Yes Darron Deatrice LABOR, MD  furosemide  (LASIX ) 40 MG tablet Take 0.5 tablets (20 mg total) by mouth daily. 11/18/24  Yes Elicia Hamlet, MD  Glucagon  (BAQSIMI  TWO PACK) 3 MG/DOSE POWD Use  as directed PRN low glucose 10/30/24  Yes McDiarmid, Krystal BIRCH, MD  hydrALAZINE  (APRESOLINE ) 50 MG tablet Take 1 tablet (50 mg total) by mouth 3 (three) times daily. 08/22/24 08/17/25 Yes McDiarmid, Krystal BIRCH, MD  hydrochlorothiazide  (HYDRODIURIL ) 12.5 MG tablet Take 1 tablet (12.5 mg total) by mouth daily. 10/30/24  Yes McDiarmid, Krystal BIRCH, MD  HYDROcodone -acetaminophen  (NORCO) 10-325 MG tablet TAKE 1 TABLET BY MOUTH TWICE DAILY AS NEEDED Patient taking differently: Take 1 tablet by mouth in the morning and at bedtime. 11/06/24  Yes McDiarmid, Krystal BIRCH, MD  Insulin   Glargine (BASAGLAR  KWIKPEN) 100 UNIT/ML DIAL AND INJECT 20 UNITS UNDER THE SKIN DAILY. Patient taking differently: Inject 10-12 Units into the skin daily. 08/19/24  Yes McDiarmid, Krystal BIRCH, MD  insulin  lispro (HUMALOG ) 100 UNIT/ML injection Inject 0.03-0.05 mLs (3-5 Units total) into the skin 3 (three) times daily with meals. 3-4 units prior to breakfast, 4-5 units prior to evening meal 08/08/24 02/04/25 Yes McDiarmid, Krystal BIRCH, MD  metFORMIN  (GLUCOPHAGE ) 500 MG tablet Take 2 tablets (1,000 mg total) by mouth 2 (two) times daily with a meal. TAKE 2 TABLETS BY MOUTH TWICE DAILY WITH A MEAL 10/23/23 11/15/25 Yes McDiarmid, Krystal BIRCH, MD  pantoprazole  (PROTONIX ) 40 MG tablet Take 1 tablet (40 mg total) by mouth daily. 11/18/24  Yes Elicia Hamlet, MD  promethazine  (PHENERGAN ) 25 MG tablet Take 1 tablet (25 mg total) by mouth every 6 (six) hours as needed for nausea or vomiting. Patient taking differently: Take 12.5-25 mg by mouth every 6 (six) hours as needed for nausea or vomiting. 09/24/24  Yes McDiarmid, Krystal BIRCH, MD  tamsulosin  (FLOMAX ) 0.4 MG CAPS capsule Take 1 capsule (0.4 mg total) by mouth daily. 02/20/24  Yes McDiarmid, Krystal BIRCH, MD  apixaban  (ELIQUIS ) 5 MG TABS tablet Take 1 tablet (5 mg total) by mouth 2 (two) times daily. To begin after completion of Eliquis  (apixaban ) VTE starter pack Patient not taking: Reported on 11/19/2024 12/17/24   Trixie Nilda HERO, MD      Family History  Problem Relation Age of Onset   Heart disease Mother    Diabetes Mother    Heart failure Mother    Heart disease Father    Sudden death Brother    Drug abuse Son     Social History   Socioeconomic History   Marital status: Married    Spouse name: Katheryn    Number of children: 2   Years of education: 10   Highest education level: 10th grade  Occupational History   Occupation: music therapist  Tobacco Use   Smoking status: Every Day    Current packs/day: 0.00    Average packs/day: 1 pack/day for 48.0 years (48.0 ttl pk-yrs)    Types: Cigarettes    Start date: 08/12/1970    Last attempt to quit: 08/12/2018    Years since quitting: 6.2   Smokeless tobacco: Never   Tobacco comments:    Quit post CABG in 2019 - previous 1ppd  Vaping Use   Vaping status: Never Used  Substance and Sexual Activity   Alcohol  use: No    Alcohol /week: 0.0 standard drinks of alcohol    Drug use: No   Sexual activity: Yes    Birth control/protection: Post-menopausal  Other Topics Concern   Not on file  Social History Narrative   Lives with his wife in [place].    Wife Katheryn is also [facility] patient.   Works odd-jobs in holiday representative.    Previously used marijuana -quit 10/03. Previous 1ppd smoker- quit 2019.   Surrogate decision maker: Grafton Warzecha (wife)   Patient has one daughter.   Patients son died of a drug overdose 03-10-21.   Patient has 2 dogs and 2 cats.       Social Drivers of Health   Tobacco Use: High Risk (11/22/2024)   Patient History    Smoking Tobacco Use: Every Day    Smokeless Tobacco Use: Never    Passive Exposure: Not on file  Financial Resource Strain: Low Risk (08/07/2023)   Overall Financial Resource Strain (CARDIA)  Difficulty of Paying Living Expenses: Not hard at all  Food Insecurity: No Food Insecurity (11/19/2024)   Epic    Worried About Programme Researcher, Broadcasting/film/video in the Last Year: Never true    Ran Out of Food in the Last Year: Never true  Transportation Needs:  No Transportation Needs (11/19/2024)   Epic    Lack of Transportation (Medical): No    Lack of Transportation (Non-Medical): No  Physical Activity: Sufficiently Active (08/07/2023)   Exercise Vital Sign    Days of Exercise per Week: 5 days    Minutes of Exercise per Session: 30 min  Stress: No Stress Concern Present (08/07/2023)   [employer] of Occupational Health - Occupational Stress Questionnaire    Feeling of Stress : Not at all  Social Connections: Moderately Isolated (11/19/2024)   Social Connection and Isolation Panel    Frequency of Communication with Friends and Family: More than three times a week    Frequency of Social Gatherings with Friends and Family: Three times a week    Attends Religious Services: Never    Active Member of Clubs or Organizations: No    Attends Banker Meetings: Never    Marital Status: Married  Depression (PHQ2-9): Low Risk (08/22/2024)   Depression (PHQ2-9)    PHQ-2 Score: 0  Alcohol  Screen: Low Risk (08/07/2023)   Alcohol  Screen    Last Alcohol  Screening Score (AUDIT): 0  Housing: Low Risk (11/19/2024)   Epic    Unable to Pay for Housing in the Last Year: No    Number of Times Moved in the Last Year: 0    Homeless in the Last Year: No  Utilities: Not At Risk (11/19/2024)   Epic    Threatened with loss of utilities: No  Health Literacy: Adequate Health Literacy (08/07/2023)   B1300 Health Literacy    Frequency of need for help with medical instructions: Rarely     Review of Systems: A 12 point ROS discussed and pertinent positives are indicated in the HPI above.  All other systems are negative.  Review of Systems  Unable to perform ROS: Mental status change    Vital Signs: BP (!) 175/89   Pulse 86   Temp 97.6 F (36.4 C) (Oral)   Resp 17   Ht 5' 10 (1.778 m)   Wt 148 lb 13 oz (67.5 kg)   SpO2 95%   BMI 21.35 kg/m   Physical Exam Vitals and nursing note reviewed.  Constitutional:      Comments: Somnolent; wife  requests he not be disturbed   HENT:     Head: Normocephalic.  Cardiovascular:     Rate and Rhythm: Normal rate.  Pulmonary:     Effort: Pulmonary effort is normal.  Skin:    General: Skin is warm and dry.     Coloration: Skin is not jaundiced.      MD Evaluation Airway: WNL Heart: WNL Abdomen: WNL Chest/ Lungs: WNL ASA  Classification: 3 Mallampati/Airway Score: Two   Imaging: ECHOCARDIOGRAM LIMITED Result Date: 11/23/2024    ECHOCARDIOGRAM LIMITED REPORT   Patient Name:   Colin Keller Date of Exam: 11/23/2024 Medical Rec #:  [id number]     Height:       70.0 in Accession #:    [id number]    Weight:       148.8 lb Date of Birth:  08-May-1956     BSA:          1.841 m Patient  Age:    68 years      BP:           131/69 mmHg Patient Gender: M             HR:           59 bpm. Exam Location:  Inpatient Procedure: Limited Echo (Both Spectral and Color Flow Doppler were utilized            during procedure). Indications:    R94.31 Abnormal EKG  History:        Patient has prior history of Echocardiogram examinations, most                 recent 11/16/2024. CAD.  Sonographer:    Nathanel Devonshire Referring Phys: [id number] TINNIE FORBES FURTH IMPRESSIONS  1. Limited echo with limited images  2. Left ventricular ejection fraction, by estimation, is 50 to 55%. Left ventricular ejection fraction by 2D MOD biplane is 52.0 %. The left ventricle has low normal function. The left ventricle demonstrates global hypokinesis. There is mild left ventricular hypertrophy. Left ventricular diastolic parameters are consistent with Grade III diastolic dysfunction (restrictive). Elevated left ventricular end-diastolic pressure.  3. The mitral valve is degenerative. Trivial mitral valve regurgitation.  4. The inferior vena cava is normal in size with <50% respiratory variability, suggesting right atrial pressure of 8 mmHg. Comparison(s): Changes from prior study are noted. 11/16/2024: LVEF 45-50%, grade III DD. FINDINGS  Left  Ventricle: Left ventricular ejection fraction, by estimation, is 50 to 55%. Left ventricular ejection fraction by 2D MOD biplane is 52.0 %. The left ventricle has low normal function. The left ventricle demonstrates global hypokinesis. There is mild left ventricular hypertrophy. Left ventricular diastolic parameters are consistent with Grade III diastolic dysfunction (restrictive). Elevated left ventricular end-diastolic pressure. Mitral Valve: The mitral valve is degenerative in appearance. Mild to moderate mitral annular calcification. Trivial mitral valve regurgitation. Venous: The inferior vena cava is normal in size with less than 50% respiratory variability, suggesting right atrial pressure of 8 mmHg.  LV Volumes (MOD)               Biplane EF (MOD) LV vol d, MOD    131.0 ml      LV Biplane EF:   Left A2C:                                            ventricular LV vol d, MOD    145.0 ml                       ejection A4C:                                            fraction by LV vol s, MOD    60.3 ml                        2D MOD A2C:                                            biplane is LV vol s, MOD  73.1 ml                        52.0 %. A4C: LV SV MOD A2C:   70.7 ml       Diastology LV SV MOD A4C:   145.0 ml      LV e' medial:   3.48 cm/s LV SV MOD BP:    73.1 ml       LV E/e' medial: 34.8  MITRAL VALVE MV Area (PHT): 4.63 cm MV Decel Time: 164 msec MV E velocity: 121.00 cm/s MV A velocity: 51.90 cm/s MV E/A ratio:  2.33 Vinie Maxcy MD Electronically signed by Vinie Maxcy MD Signature Date/Time: 11/23/2024/12:26:13 PM    Final    DG C-Arm 1-60 Min-No Report Result Date: 11/22/2024 Fluoroscopy was utilized by the requesting physician.  No radiographic interpretation.   DG C-Arm 1-60 Min-No Report Result Date: 11/22/2024 Fluoroscopy was utilized by the requesting physician.  No radiographic interpretation.   NM Hepatobiliary Liver Func Result Date: 11/20/2024 EXAM: NM HEPATOBILLARY SCAN  11/20/2024 04:18:31 PM TECHNIQUE: RADIOPHARMACEUTICAL: 7.5 mCi Tc-47m mebrofenin  Dynamic images of the abdomen and pelvis were obtained in the anterior projection for 90 minutes after intravenous administration of radiopharmaceutical. COMPARISON: MRI 11/19/2024. CLINICAL HISTORY: Cholecystitis. FINDINGS: There is uniform uptake within the liver. No activity is present within the common bile duct over the entirety of the exam. The gallbladder cannot be assessed without filling of the common duct. Findings concerning for distal common bile duct obstruction in light of the prior day MRCP. IMPRESSION: 1. Findings concerning for distal common bile duct obstruction in light of the prior day MRCP. Electronically signed by: Norleen Boxer MD 11/20/2024 04:26 PM EST RP Workstation: [id number]   MR ABDOMEN MRCP WO CONTRAST Result Date: 11/19/2024 CLINICAL DATA:  Abdominal pain. Gallbladder wall thickening and biliary ductal dilatation on recent ultrasound. EXAM: MRI ABDOMEN WITHOUT CONTRAST  (INCLUDING MRCP) TECHNIQUE: Multiplanar multisequence MR imaging of the abdomen was performed. Heavily T2-weighted images of the biliary and pancreatic ducts were obtained, and three-dimensional MRCP images were rendered by post processing. COMPARISON:  None Available. FINDINGS: Lower chest: No acute findings. Hepatobiliary:  No masses visualized on this unenhanced exam. The gallbladder is mildly distended and shows mild diffuse wall thickening. No definite gallstones are seen. Pericholecystic edema is seen but is similar to diffuse mesenteric, retroperitoneal, and body wall edema seen throughout the abdomen. Diffuse biliary ductal dilatation is seen, with common bile duct measuring 14 mm in diameter. No evidence of choledocholithiasis. Stricture of the distal common bile duct is seen within the pancreatic head. Pancreas: Evaluation is limited by lack of intravenous contrast and some motion artifact. Multiple tiny cystic foci are seen  within the pancreatic head at the site of distal common bile duct stricture. This may be due to chronic pancreatitis, however, pancreatic mass cannot definitely be excluded. No No evidence of main pancreatic ductal dilatation. Spleen:  Within normal limits in size. Adrenals/Urinary tract: Unremarkable. No evidence of nephrolithiasis or hydronephrosis. Stomach/Bowel: No dilated bowel loops. Diffuse mesenteric edema and minimal perihepatic ascites. Vascular/Lymphatic: No pathologically enlarged lymph nodes identified. No evidence of abdominal aortic aneurysm. Other:  None. Musculoskeletal:  No suspicious bone lesions identified. 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 No evidence of choledocholithiasis. Multiple tiny cystic foci within the pancreatic head at the site of distal common bile duct stricture. This may be due to chronic pancreatitis, however, pancreatic mass cannot definitely be  excluded on this unenhanced exam. Recommend further imaging evaluation with pancreatic protocol abdomen CT without and with contrast Distended gallbladder with mild diffuse wall thickening, which is nonspecific. Differential diagnosis includes cholecystitis and other etiologies such as hypoalbuminemia, liver disease, or heart failure. Anasarca and minimal perihepatic ascites. Electronically Signed   By: Norleen DELENA Kil M.D.   On: 11/19/2024 17:48   MR 3D Recon At Scanner Result Date: 11/19/2024 CLINICAL DATA:  Abdominal pain. Gallbladder wall thickening and biliary ductal dilatation on recent ultrasound. EXAM: MRI ABDOMEN WITHOUT CONTRAST  (INCLUDING MRCP) TECHNIQUE: Multiplanar multisequence MR imaging of the abdomen was performed. Heavily T2-weighted images of the biliary and pancreatic ducts were obtained, and three-dimensional MRCP images were rendered by post processing. COMPARISON:  None Available. FINDINGS: Lower chest: No acute findings. Hepatobiliary:  No masses visualized on  this unenhanced exam. The gallbladder is mildly distended and shows mild diffuse wall thickening. No definite gallstones are seen. Pericholecystic edema is seen but is similar to diffuse mesenteric, retroperitoneal, and body wall edema seen throughout the abdomen. Diffuse biliary ductal dilatation is seen, with common bile duct measuring 14 mm in diameter. No evidence of choledocholithiasis. Stricture of the distal common bile duct is seen within the pancreatic head. Pancreas: Evaluation is limited by lack of intravenous contrast and some motion artifact. Multiple tiny cystic foci are seen within the pancreatic head at the site of distal common bile duct stricture. This may be due to chronic pancreatitis, however, pancreatic mass cannot definitely be excluded. No No evidence of main pancreatic ductal dilatation. Spleen:  Within normal limits in size. Adrenals/Urinary tract: Unremarkable. No evidence of nephrolithiasis or hydronephrosis. Stomach/Bowel: No dilated bowel loops. Diffuse mesenteric edema and minimal perihepatic ascites. Vascular/Lymphatic: No pathologically enlarged lymph nodes identified. No evidence of abdominal aortic aneurysm. Other:  None. Musculoskeletal:  No suspicious bone lesions identified. 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 No evidence of choledocholithiasis. Multiple tiny cystic foci within the pancreatic head at the site of distal common bile duct stricture. This may be due to chronic pancreatitis, however, pancreatic mass cannot definitely be excluded on this unenhanced exam. Recommend further imaging evaluation with pancreatic protocol abdomen CT without and with contrast Distended gallbladder with mild diffuse wall thickening, which is nonspecific. Differential diagnosis includes cholecystitis and other etiologies such as hypoalbuminemia, liver disease, or heart failure. Anasarca and minimal perihepatic ascites. Electronically Signed    By: Norleen DELENA Kil M.D.   On: 11/19/2024 17:48   US  Abdomen Limited RUQ (LIVER/GB) Result Date: 11/19/2024 EXAM: Right Upper Quadrant Abdominal Ultrasound 11/19/2024 11:48:11 AM TECHNIQUE: Real-time ultrasonography of the right upper quadrant of the abdomen was performed. COMPARISON: CT of the abdomen and pelvis dated 11/19/2024. CLINICAL HISTORY: Cholecystitis. FINDINGS: LIVER: The liver demonstrates normal echogenicity. No intrahepatic biliary ductal dilatation. No evidence of mass. BILIARY SYSTEM: Gallbladder wall thickness measures 7 mm. The patient is not focally tender over the gallbladder. No pericholecystic fluid. No cholelithiasis. The common bile duct is abnormally dilated, measuring 13.5 mm. No intrahepatic biliary ductal dilatation. OTHER: No right upper quadrant ascites. IMPRESSION: 1. Gallbladder wall thickening (7 mm) without focal tenderness. 2. Dilated common bile duct (13.5 mm) without intrahepatic biliary ductal dilatation. Electronically signed by: Evalene Coho MD 11/19/2024 12:10 PM EST RP Workstation: [id number]   CT ABDOMEN PELVIS WO CONTRAST Result Date: 11/19/2024 CLINICAL DATA:  Abdominal pain. EXAM: CT ABDOMEN AND PELVIS WITHOUT CONTRAST TECHNIQUE: Multidetector CT imaging of the abdomen and pelvis was performed following the standard  protocol without IV contrast. RADIATION DOSE REDUCTION: This exam was performed according to the departmental dose-optimization program which includes automated exposure control, adjustment of the mA and/or kV according to patient size and/or use of iterative reconstruction technique. COMPARISON:  06/20/2013 FINDINGS: Lower chest: Dependent atelectasis. Hepatobiliary: No suspicious focal abnormality in the liver on this study without intravenous contrast. Gallbladder lumen is filled with high density material. This is a new finding since chest CTA 11/15/2024 and likely reflects vicarious excretion of intravenous contrast administered for that study.  Gallbladder wall appears circumferentially thickened. Intrahepatic biliary duct dilatation evident. Common bile duct not well demonstrated on this noncontrast exam. Pancreas: No focal mass lesion. No dilatation of the main duct. No intraparenchymal cyst. No peripancreatic edema. Spleen: No splenomegaly. No suspicious focal mass lesion. Adrenals/Urinary Tract: No right adrenal nodule or mass. Soft tissue nodularity in the expected location of the left adrenal gland may reflect nodular thickening, small lymph nodes, or vascular anatomy. This is not well evaluated given lack of intravenous contrast material. Both kidneys are heterogeneous in appearance. Probable punctate stones bilaterally. No hydronephrosis. No hydroureter. The urinary bladder appears normal for the degree of distention. Stomach/Bowel: Stomach is distended with fluid. Duodenum is normally positioned as is the ligament of Treitz. No small bowel wall thickening. No small bowel dilatation. The terminal ileum is normal. The appendix is not well visualized, but there is no edema or inflammation in the region of the cecal tip to suggest appendicitis. No gross colonic mass. No colonic wall thickening. Vascular/Lymphatic: There is advanced atherosclerotic calcification of the abdominal aorta without aneurysm. Possible borderline lymphadenopathy in the left para-aortic space. No pelvic sidewall lymphadenopathy. Reproductive: The prostate gland and seminal vesicles are unremarkable. Other: Small volume confluent edema is seen in the right upper quadrant of the abdomen adjacent to the gallbladder. Musculoskeletal: No worrisome lytic or sclerotic osseous abnormality. IMPRESSION: 1. Gallbladder lumen is filled with high density material, likely vicarious excretion of intravenous contrast administered for chest CTA 11/15/2024. Gallbladder wall appears circumferentially thickened with small volume confluent edema in the right upper quadrant of the abdomen adjacent  to the gallbladder. Imaging features raise the question of acute cholecystitis. Right upper quadrant ultrasound may prove helpful. 2. Intrahepatic biliary duct dilatation. Common bile duct not well demonstrated on this noncontrast exam. Correlation with liver function test recommended. 3. Soft tissue nodularity in the expected location of the left adrenal gland may reflect nodular thickening, small lymph nodes, or vascular anatomy. This is not well evaluated given lack of intravenous contrast material. 4. Possible borderline lymphadenopathy in the left para-aortic space. 5.  Aortic Atherosclerosis (ICD10-I70.0). Electronically Signed   By: Camellia Candle M.D.   On: 11/19/2024 10:44   ECHOCARDIOGRAM COMPLETE Result Date: 11/16/2024    ECHOCARDIOGRAM REPORT   Patient Name:   Colin Keller Date of Exam: 11/16/2024 Medical Rec #:  [id number]     Height:       70.0 in Accession #:    [id number]    Weight:       154.0 lb Date of Birth:  [birth year]     BSA:          1.868 m Patient Age:    68 years      BP:           140/67 mmHg Patient Gender: M             HR:           93 bpm. Exam Location:  Inpatient Procedure: 2D Echo, 3D Echo, Color Doppler, Cardiac Doppler and Strain Analysis            (Both Spectral and Color Flow Doppler were utilized during            procedure). Indications:    Pulmonary Embolus  History:        Patient has prior history of Echocardiogram examinations, most                 recent 10/08/2018. CHF; Risk Factors:Current Smoker,                 Hypertension, Diabetes and Dyslipidemia.  Sonographer:    Logan Shove RDCS Referring Phys: [id number] NILDA HERO GHERGHE IMPRESSIONS  1. Left ventricular ejection fraction, by estimation, is 45 to 50%. Left ventricular ejection fraction by 3D volume is 45 %. The left ventricle has mildly decreased function. The left ventricle demonstrates global hypokinesis. Left ventricular diastolic  parameters are consistent with Grade III diastolic dysfunction (restrictive).  Elevated left atrial pressure. The average left ventricular global longitudinal strain is -15.3 %. The global longitudinal strain is abnormal.  2. Right ventricular systolic function is normal. The right ventricular size is moderately enlarged. Tricuspid regurgitation signal is inadequate for assessing PA pressure.  3. Left atrial size was severely dilated.  4. The mitral valve is normal in structure. Trivial mitral valve regurgitation. No evidence of mitral stenosis.  5. The aortic valve is normal in structure. Aortic valve regurgitation is not visualized. No aortic stenosis is present.  6. The inferior vena cava is dilated in size with >50% respiratory variability, suggesting right atrial pressure of 8 mmHg.  7. There is a long serpiginous density next to the oriface of the IVC into the RA. Review of this area on prior CTA shows no evidcene of thrombus. This likely represents a prominent eustacion valve. FINDINGS  Left Ventricle: Left ventricular ejection fraction, by estimation, is 45 to 50%. Left ventricular ejection fraction by 3D volume is 45 %. The left ventricle has mildly decreased function. The left ventricle demonstrates global hypokinesis. The average left ventricular global longitudinal strain is -15.3 %. Strain was performed and the global longitudinal strain is abnormal. The left ventricular internal cavity size was normal in size. There is no left ventricular hypertrophy. Left ventricular diastolic parameters are consistent with Grade III diastolic dysfunction (restrictive). Elevated left atrial pressure. Right Ventricle: The right ventricular size is moderately enlarged. No increase in right ventricular wall thickness. Right ventricular systolic function is normal. Tricuspid regurgitation signal is inadequate for assessing PA pressure. Left Atrium: Left atrial size was severely dilated. Right Atrium: There is a long serpiginous density next to the oriface of the IVC into the RA. Review of this area  on prior CTA shows no evidcene of thrombus. This likely represents a prominent eustacion valve. Right atrial size was normal in size. Pericardium: There is no evidence of pericardial effusion. Mitral Valve: The mitral valve is normal in structure. Mild to moderate mitral annular calcification. Trivial mitral valve regurgitation. No evidence of mitral valve stenosis. Tricuspid Valve: The tricuspid valve is normal in structure. Tricuspid valve regurgitation is not demonstrated. No evidence of tricuspid stenosis. Aortic Valve: The aortic valve is normal in structure. Aortic valve regurgitation is not visualized. No aortic stenosis is present. Aortic valve peak gradient measures 3.1 mmHg. Pulmonic Valve: The pulmonic valve was normal in structure. Pulmonic valve regurgitation is not visualized. No evidence of pulmonic stenosis. Aorta: The aortic root is  normal in size and structure. Venous: The inferior vena cava is dilated in size with greater than 50% respiratory variability, suggesting right atrial pressure of 8 mmHg. IAS/Shunts: No atrial level shunt detected by color flow Doppler. Additional Comments: 3D was performed not requiring image post processing on an independent workstation and was abnormal.  LEFT VENTRICLE PLAX 2D LVIDd:         5.00 cm         Diastology LVIDs:         3.80 cm         LV e' medial:    5.44 cm/s LV PW:         1.00 cm         LV E/e' medial:  20.0 LV IVS:        1.10 cm         LV e' lateral:   9.03 cm/s LVOT diam:     2.00 cm         LV E/e' lateral: 12.1 LVOT Area:     3.14 cm LV IVRT:       137 msec        2D Longitudinal                                Strain                                2D Strain GLS   -15.0 %                                (A4C):                                2D Strain GLS   -15.7 %                                (A3C):                                2D Strain GLS   -15.3 %                                (A2C):                                2D Strain GLS   -15.3  %                                Avg:                                 3D Volume EF                                LV 3D EF:    Left  ventricul                                             ar                                             ejection                                             fraction                                             by 3D                                             volume is                                             45 %.                                 3D Volume EF:                                3D EF:        45 %                                LV EDV:       162 ml                                LV ESV:       89 ml                                LV SV:        74 ml RIGHT VENTRICLE            IVC RV Basal diam:  4.50 cm    IVC diam: 2.30 cm RV Mid diam:    3.50 cm RV S prime:     8.59 cm/s  PULMONARY VEINS TAPSE (M-mode): 1.1 cm     Diastolic Velocity: 44.20 cm/s                            S/D Velocity:       0.70                            Systolic Velocity:  31.50 cm/s LEFT ATRIUM              Index         RIGHT ATRIUM           Index LA diam:        5.20 cm  2.78 cm/m    RA Area:     20.30 cm LA Vol (A2C):   193.0 ml 103.33 ml/m  RA Volume:   57.10 ml  30.57 ml/m LA Vol (A4C):   98.8 ml  52.89 ml/m LA Biplane Vol: 143.0 ml 76.56 ml/m  AORTIC VALVE AV Area (Vmax): 2.58 cm AV Vmax:        88.50 cm/s AV Peak Grad:   3.1 mmHg LVOT Vmax:      72.60 cm/s  AORTA Ao Root diam: 3.20 cm Ao Asc diam:  3.20 cm MITRAL VALVE MV Area (PHT): 4.31 cm     SHUNTS MV Decel Time: 176 msec     Systemic Diam: 2.00 cm MV E velocity: 109.00 cm/s MV A velocity: 30.50 cm/s MV E/A ratio:  3.57 Wilbert Bihari MD Electronically signed by Wilbert Bihari MD Signature Date/Time: 11/16/2024/3:52:00 PM    Final    CT Angio Chest PE W and/or Wo Contrast Result Date: 11/15/2024 EXAM: CTA of the Chest with contrast for PE 11/15/2024 07:49:11 PM TECHNIQUE: CTA of the chest was  performed after the administration of 60 mL of iohexol  (OMNIPAQUE ) 350 MG/ML injection. Multiplanar reformatted images are provided for review. MIP images are provided for review. Automated exposure control, iterative reconstruction, and/or weight based adjustment of the mA/kV was utilized to reduce the radiation dose to as low as reasonably achievable. COMPARISON: None available. CLINICAL HISTORY: sob w/ + d dimer FINDINGS: PULMONARY ARTERIES: Pulmonary arteries are adequately opacified for evaluation. A faint nonocclusive filling defect is present in the posterior basilar segmental artery of the left lower lobe. No other definite emboli are present. Main pulmonary artery is normal in caliber. MEDIASTINUM: The heart and pericardium demonstrate no acute abnormality. Dense atherosclerotic calcifications are present in the coronary arteries. Atherosclerotic changes are present in the aortic arch and great vessel origins. 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 There is no acute abnormality of the thoracic aorta. LYMPH NODES: No mediastinal, hilar or axillary lymphadenopathy. LUNGS AND PLEURA: A moderate left pleural effusion is present. A small right pleural effusion is present. Mild dependent atelectasis is present bilaterally. A 5 mm nodule in the left upper lobe is best visualized on image 49 of series 6. Punctate nodules are present in the left lower lobe. An 8 mm nodule is present within the superior segment of the right lower lobe on image 65 of series 7. No pneumothorax. UPPER ABDOMEN: Limited images of the upper abdomen are unremarkable. SOFT TISSUES AND BONES: No acute bone or soft tissue abnormality. IMPRESSION: 1. Faint nonocclusive filling defect in the posterior basilar segmental artery of the left lower lobe, consistent with a small pulmonary embolism. No other definite emboli are present. 2. 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 3. Moderate  left pleural effusion and small right pleural effusion. 4. Pulmonary nodules including a 5 mm left upper lobe nodule and an 8 mm right lower lobe nodule. For the 8 mm solid nodule, recommend non-contrast chest CT at 36 months; if high risk for malignancy, consider PET/CT or tissue sampling per Fleischner Society Guidelines. 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 CLINICAL HISTORY: SOB. FINDINGS: LUNGS AND PLEURA: No focal pulmonary opacity. No pleural effusion. No pneumothorax. HEART AND MEDIASTINUM: No acute abnormality of the cardiac and mediastinal silhouettes. BONES AND SOFT TISSUES: No acute osseous abnormality. IMPRESSION: 1. No acute cardiopulmonary process. Electronically signed by: [name] Seip MD 11/15/2024 06:13 PM EST RP Workstation: [id number]    Labs:  CBC: Recent Labs    11/22/24 1735 11/22/24 1844 11/23/24 0208 11/24/24 0130  WBC 16.8* 16.7* 16.7* 18.8*  HGB 6.8* 6.9* 7.5* 8.3*  HCT 19.8* 20.7* 21.6* 23.4*  PLT 210 217 264 276    COAGS: Recent Labs    11/21/24 0424 11/21/24 1409 11/22/24 0344 11/23/24 0208  APTT 94* 130* 87* 38*    BMP: Recent Labs    11/22/24 1221 11/22/24 1505 11/22/24 1735 11/23/24 0208 11/24/24 0130  NA 133* 136 136 135 132*  K 4.2 3.8 3.7 3.7 3.5  CL 97* 99 101 100 94*  CO2 22  --  22 23 23   GLUCOSE 312* 299* 237* 194* 214*  BUN 50* 48* 54* 61* 74*  CALCIUM  8.1*  --  7.9* 8.2* 8.0*  CREATININE 3.27* 3.30* 3.43* 3.73* 4.09*  GFRNONAA 20*  --  18* 17* 15*    LIVER FUNCTION TESTS: Recent Labs    11/22/24 0344 11/22/24 1221 11/23/24 0208 11/24/24 0822  BILITOT 4.2* 3.9* 2.8* 3.2*  AST 231* 154* 93* 347*  ALT 198* 169* 143* 211*  ALKPHOS 590* 582* 450* 1,158*  PROT 5.9* 5.9* 5.7* 6.0*  ALBUMIN  2.0* 2.0*  2.0* 1.9*    TUMOR MARKERS: No results for input(s): AFPTM, CEA, CA199, CHROMGRNA in the last 8760 hours.  Assessment and Plan:  68 y/o M with history as per HPI seen today for cholecystitis and percutaneous cholecystostomy placement. Patient history and imaging reviewed by Dr. Jennefer who approves procedure for today. Discussed with patient's wife at bedside who gives consent to proceed.  Risks and benefits discussed with the patient including, but not limited to bleeding, infection, gallbladder perforation, bile leak, sepsis or even death.  All of the patient's questions were answered, patient is agreeable to proceed.  Consent signed and in chart.  Thank you for this interesting consult.  I greatly enjoyed meeting Colin Keller and look forward to participating in their care.  A copy of this report was sent to the requesting provider on this date.  Electronically Signed: Clotilda DELENA Hesselbach, PA-C 11/24/2024, 3:14 PM   I spent a total of 40 Minutes  in face to face in clinical consultation, greater than 50% of which was counseling/coordinating care for cholecystitis.

## 2024-11-24 NOTE — Plan of Care (Signed)
   Problem: Education: Goal: Knowledge of General Education information will improve Description Including pain rating scale, medication(s)/side effects and non-pharmacologic comfort measures Outcome: Progressing   Problem: Clinical Measurements: Goal: Diagnostic test results will improve Outcome: Progressing Goal: Respiratory complications will improve Outcome: Progressing

## 2024-11-24 NOTE — Assessment & Plan Note (Addendum)
 Recently discharged on eliquis . Normal wob on room air. - Holding heparin  drip for possible procedure today - Hold eliquis 

## 2024-11-24 NOTE — Progress Notes (Addendum)
 Daily Progress Note Intern Pager: [phone]  Patient name: ETHYN SCHETTER Medical record number: [id number] Date of birth: [birth year] Age: 68 y.o. Gender: male  Primary Care Provider: McDiarmid, Krystal BIRCH, MD Consultants: GI, gen surg, nephro, IR Code Status: Full  Pt Overview and Major Events to Date:  12-9 admitted 12-12 ERCP, transfer to ICU for persistent bradycardia 12-14 return to floor from ICU, IR and Nephro consulted  Medical Decision Making:  4 m w/ hx of recent PE admitted for abdominal pain now suspected 2/2 acute cholecystitis. VSS but labs concerning w/ elevated LFTs and bilirubin, progressively worsening AKI. Will proceed towards percutaneous cholecystostomy tube placement w/ IR.  Assessment & Plan Abdominal pain Biliary stricture (HCC) Biliary sepsis (HCC) VSS. Patient endorsing continued abdominal pain. Worsened delirium overnight requiring restraints. LFTs and bilirubin uptrending. WBCs uptrending vs prior.  - Pain regimen: Changed to oxycodone  2.5mg  q6 prn, d/c'd Dilaudid  given concern for delirium - Bowel regimen w/ miralax  bid and senna daily - Zofran  4mg  q6 prn for nausea - Maalox q6 prn, caution w/ aki - Continue IV zosyn  for concern for acute cholecystitis -GI consulted, appreciate their recs -Gen Surg consulted, appreciate recs  Both of the above services recommend IR consult for percutaneous cholecystostomy tube placement -Consult IR -AM CBC, CMP Delusions (HCC) Patient experiencing delusions overnight 12/12. Possibly 2/2 infection with possible contribution from narcotic pain medications.  -Neuro exam non focal -Normal respiration, no concern for hypercarbia -Scaled back pain regimen as described above -Also possibly 2/2 uremia in the setting of persistent AKI, see below AKI (acute kidney injury) Cr continues to uptrend day over day, now up to 4.09. On Zosyn  for acute infection. Urinating appropriately.  - Resume maintenance IVF with 100 mL/h  LR - holding home lasix , hydral, hydrochlorothiazide  - AM CMP - Nephro consulted   - IR procedure for acute cholecystitis involves contrast, requiring nephro approval in the setting of this AKI Acute pulmonary embolism (HCC) Recently discharged on eliquis . Normal wob on room air. - Holding heparin  drip for possible procedure today - Hold eliquis  T2DM (type 2 diabetes mellitus) (HCC) BG persistently elevated > 200 last 24 hours. Received 15u LAI total 12/13, 5u at 2100.  - Increase LAI to 10u daily - Change SSI to moderate  - Hold home metformin  Chronic health problem HTN/CAD/CHF: hold home Coreg , diltiazem  BPH: Continue home tamsulosin  GERD: Continue home Protonix   FEN/GI: NPO in anticipation of procedure PPx: holding heparin  for the same Dispo:pending clinical improvement   Subjective:  Denied n/v with me this AM. Endorses one BM yesterday w/o evidence of blood. Endorses continued confusion overnight. He is feeling badly, reports feeling much worse than when we last spoke 12/12. Reports indigestion, back pain, abdominal pain. Feels oriented but still experiencing confusion and seeing things. Reports that he has the experience of experiencing [place] daily though he never did go to [place].   Objective: Temp:  [97 F (36.1 C)-98 F (36.7 C)] 98 F (36.7 C) (12/14 0709) Pulse Rate:  [39-84] 77 (12/14 0700) Resp:  [12-23] 13 (12/14 0700) BP: (127-181)/(66-98) 130/98 (12/14 0700) SpO2:  [95 %-99 %] 96 % (12/14 0700) Physical Exam: General: Awake, alert. Communicates clearly. Moderate distress, not agitated.  Cardio: RRR. Normal S1, S2. No murmur, rub, gallop. 2+ radial and dorsalis pedis pulses b/l w/ good capillary refill.  Resp: CTA bilaterally. No wheezes, rales, or rhonchi. Normal work of breathing on room air Abdomen: soft, diffusely TTP in R side of abdomen w/  increased guarding vs prior exam and positive Murphys.  Neuro: Aox4, appropriately responsive and conversant, no  focal deficits.  Psych: Describing delusions but demonstrates good insight  Laboratory: Most recent CBC Lab Results  Component Value Date   WBC 18.8 (H) 11/24/2024   HGB 8.3 (L) 11/24/2024   HCT 23.4 (L) 11/24/2024   MCV 89.7 11/24/2024   PLT 276 11/24/2024   Most recent BMP    Latest Ref Rng & Units 11/24/2024    1:30 AM  BMP  Glucose 70 - 99 mg/dL 785   BUN 8 - 23 mg/dL 74   Creatinine 9.38 - 1.24 mg/dL 5.90   Sodium 864 - 854 mmol/L 132   Potassium 3.5 - 5.1 mmol/L 3.5   Chloride 98 - 111 mmol/L 94   CO2 22 - 32 mmol/L 23   Calcium  8.9 - 10.3 mg/dL 8.0     AST 652 ALT 788 Alk Phos 1158 T bili  3.2 D bili 1.8 Ind bili 1.4  Imaging/Diagnostic Tests: None  Manon Jester, DO 11/24/2024, 8:11 AM  PGY-1, [facility] FPTS Intern pager: [phone], text pages welcome Secure chat group [facility] Teaching Service

## 2024-11-24 NOTE — Plan of Care (Signed)
°  Problem: Clinical Measurements: Goal: Diagnostic test results will improve 11/24/2024 1630 by Teressa Rouleau, RN Outcome: Not Progressing Liver enzymes increased, to IR for biliary drain.   Problem: Nutrition: Goal: Adequate nutrition will be maintained 11/24/2024 1630 by Teressa Rouleau, RN Outcome: Not Progressing Poor oral intake, now again NPO

## 2024-11-24 NOTE — Progress Notes (Addendum)
 Assessment & Plan: HD#10 - Biliary obstruction  - CT 12/9 with thickened gallbladder, intrahepatic biliary ductal dilation, soft tissue nodularity in location of expected left adrenal gland, possible borderline lymphadenopathy in left para-aortic space - RUQ US  12/9 with gallbladder wall thickening without focal tenderness and dilated common duct - MRCP 12/9 without choledocholithiasis, multiple tiny cystic foci in pancreatic head, distended gallbladder with mild diffuse wall thickening  which is nonspecific, anasarca and minimal perihepatic ascites - no clear cholelithiasis noted on any imaging modality  - HIDA 12/10 with no CBD activity and gallbladder can't really be assessed in that setting - findings suggestive of distal CBD obstruction  - ERCP 12/12 - distal biliary stricture 3cm unclear etiology, plastic stent placed, no brushings, nonfilling of cystic duct   Patient with nausea and emesis this AM with breakfast.  Some abdominal pain.  WBC up to 18.8K this AM.  Mild tenderness on exam.  Clinical picture more concerning for cholecystitis.  Would consider proceeding with percutaneous cholecystostomy by IR unless GI has another suggestion.  Discussed with patient this AM.   FEN: carb modified, IVF per TR[facility] VTE: hep gtt-- ID: [email or web address]    - per TR[facility] -  Acute PE - hep gtt CAD s/p CABG HTN HLD CHFrEF T2DM AKI on CKD PAD OSA Chronic back pain Hx of tobacco abuse          Krystal Spinner, MD [facility] A [facility] practice Office: [phone]        Chief Complaint: Biliary obstruction  Subjective: Patient in bed, nausea and emesis this AM with breakfast. Mild pain.  Objective: Vital signs in last 24 hours: Temp:  [97 F (36.1 C)-98 F (36.7 C)] 98 F (36.7 C) (12/14 0709) Pulse Rate:  [39-84] 77 (12/14 0700) Resp:  [12-23] 13 (12/14 0700) BP: (127-181)/(66-98) 130/98 (12/14 0700) SpO2:  [95 %-99 %] 96 % (12/14 0700) Last BM Date :  11/19/24  Intake/Output from previous day: 12/13 0701 - 12/14 0700 In: 996.5 [P.O.:720; IV Piggyback:276.5] Out: 1300 [Urine:1300] Intake/Output this shift: No intake/output data recorded.  Physical Exam: HEENT - sclerae clear, mucous membranes moist Neck - soft Abdomen - mild distension; mild tenderness; no mass  Lab Results:  Recent Labs    11/23/24 0208 11/24/24 0130  WBC 16.7* 18.8*  HGB 7.5* 8.3*  HCT 21.6* 23.4*  PLT 264 276   BMET Recent Labs    11/23/24 0208 11/24/24 0130  NA 135 132*  K 3.7 3.5  CL 100 94*  CO2 23 23  GLUCOSE 194* 214*  BUN 61* 74*  CREATININE 3.73* 4.09*  CALCIUM  8.2* 8.0*   PT/INR No results for input(s): LABPROT, INR in the last 72 hours. Comprehensive Metabolic Panel:    Component Value Date/Time   NA 132 (L) 11/24/2024 0130   NA 135 11/23/2024 0208   NA 137 10/30/2024 1126   NA 138 06/13/2024 1011   K 3.5 11/24/2024 0130   K 3.7 11/23/2024 0208   CL 94 (L) 11/24/2024 0130   CL 100 11/23/2024 0208   CO2 23 11/24/2024 0130   CO2 23 11/23/2024 0208   BUN 74 (H) 11/24/2024 0130   BUN 61 (H) 11/23/2024 0208   BUN 38 (H) 10/30/2024 1126   BUN 42 (H) 06/13/2024 1011   CREATININE 4.09 (H) 11/24/2024 0130   CREATININE 3.73 (H) 11/23/2024 0208   CREATININE 1.28 11/24/2023 0000   CREATININE 1.57 (H) 07/14/2016 0847   GLUCOSE 214 (H) 11/24/2024 0130  GLUCOSE 194 (H) 11/23/2024 0208   CALCIUM  8.0 (L) 11/24/2024 0130   CALCIUM  8.2 (L) 11/23/2024 0208   AST 93 (H) 11/23/2024 0208   AST 154 (H) 11/22/2024 1221   ALT 143 (H) 11/23/2024 0208   ALT 169 (H) 11/22/2024 1221   ALKPHOS 450 (H) 11/23/2024 0208   ALKPHOS 582 (H) 11/22/2024 1221   BILITOT 2.8 (H) 11/23/2024 0208   BILITOT 3.9 (H) 11/22/2024 1221   BILITOT <0.2 06/11/2024 1046   BILITOT 0.4 08/03/2021 0850   PROT 5.7 (L) 11/23/2024 0208   PROT 5.9 (L) 11/22/2024 1221   PROT 6.2 06/11/2024 1046   PROT 6.8 08/03/2021 0850   ALBUMIN  2.0 (L) 11/23/2024 0208    ALBUMIN  2.0 (L) 11/22/2024 1221   ALBUMIN  3.6 (L) 06/11/2024 1046   ALBUMIN  3.9 05/04/2023 1204    Studies/Results: ECHOCARDIOGRAM LIMITED Result Date: 11/23/2024    ECHOCARDIOGRAM LIMITED REPORT   Patient Name:   Colin Keller Date of Exam: 11/23/2024 Medical Rec #:  [id number]     Height:       70.0 in Accession #:    [id number]    Weight:       148.8 lb Date of Birth:  [birth year]     BSA:          1.841 m Patient Age:    68 years      BP:           131/69 mmHg Patient Gender: M             HR:           59 bpm. Exam Location:  Inpatient Procedure: Limited Echo (Both Spectral and Color Flow Doppler were utilized            during procedure). Indications:    R94.31 Abnormal EKG  History:        Patient has prior history of Echocardiogram examinations, most                 recent 11/16/2024. CAD.  Sonographer:    Nathanel Devonshire Referring Phys: [id number] TINNIE FORBES FURTH IMPRESSIONS  1. Limited echo with limited images  2. Left ventricular ejection fraction, by estimation, is 50 to 55%. Left ventricular ejection fraction by 2D MOD biplane is 52.0 %. The left ventricle has low normal function. The left ventricle demonstrates global hypokinesis. There is mild left ventricular hypertrophy. Left ventricular diastolic parameters are consistent with Grade III diastolic dysfunction (restrictive). Elevated left ventricular end-diastolic pressure.  3. The mitral valve is degenerative. Trivial mitral valve regurgitation.  4. The inferior vena cava is normal in size with <50% respiratory variability, suggesting right atrial pressure of 8 mmHg. Comparison(s): Changes from prior study are noted. 11/16/2024: LVEF 45-50%, grade III DD. FINDINGS  Left Ventricle: Left ventricular ejection fraction, by estimation, is 50 to 55%. Left ventricular ejection fraction by 2D MOD biplane is 52.0 %. The left ventricle has low normal function. The left ventricle demonstrates global hypokinesis. There is mild left ventricular hypertrophy. Left  ventricular diastolic parameters are consistent with Grade III diastolic dysfunction (restrictive). Elevated left ventricular end-diastolic pressure. Mitral Valve: The mitral valve is degenerative in appearance. Mild to moderate mitral annular calcification. Trivial mitral valve regurgitation. Venous: The inferior vena cava is normal in size with less than 50% respiratory variability, suggesting right atrial pressure of 8 mmHg.  LV Volumes (MOD)               Biplane EF (MOD) LV  vol d, MOD    131.0 ml      LV Biplane EF:   Left A2C:                                            ventricular LV vol d, MOD    145.0 ml                       ejection A4C:                                            fraction by LV vol s, MOD    60.3 ml                        2D MOD A2C:                                            biplane is LV vol s, MOD    73.1 ml                        52.0 %. A4C: LV SV MOD A2C:   70.7 ml       Diastology LV SV MOD A4C:   145.0 ml      LV e' medial:   3.48 cm/s LV SV MOD BP:    73.1 ml       LV E/e' medial: 34.8  MITRAL VALVE MV Area (PHT): 4.63 cm MV Decel Time: 164 msec MV E velocity: 121.00 cm/s MV A velocity: 51.90 cm/s MV E/A ratio:  2.33 Vinie Maxcy MD Electronically signed by Vinie Maxcy MD Signature Date/Time: 11/23/2024/12:26:13 PM    Final    DG C-Arm 1-60 Min-No Report Result Date: 11/22/2024 Fluoroscopy was utilized by the requesting physician.  No radiographic interpretation.   DG C-Arm 1-60 Min-No Report Result Date: 11/22/2024 Fluoroscopy was utilized by the requesting physician.  No radiographic interpretation.      Krystal Spinner 11/24/2024  Patient ID: Colin Keller, male   DOB: 03/27/1956, 68 y.o.   MRN: [id number]

## 2024-11-24 NOTE — Procedures (Signed)
 Interventional Radiology Procedure Note  Procedure: Fluoroscopic and ultrasound guided percutaneous cholecystostomy tube placement  Findings: Please refer to procedural dictation for full description. 10 Fr, subcostal/transabdominal placement, to bulb suction.  Complications: None immediate  Estimated Blood Loss: < 5 mL  Recommendations: Keep to bulb suction until SIRS clears, then convert to bag. IR will follow.   Ester Sides, MD

## 2024-11-24 NOTE — Assessment & Plan Note (Addendum)
 Patient experiencing delusions overnight 12/12. Possibly 2/2 infection with possible contribution from narcotic pain medications.  -Neuro exam non focal -Normal respiration, no concern for hypercarbia -Scaled back pain regimen as described above -Also possibly 2/2 uremia in the setting of persistent AKI, see below

## 2024-11-24 NOTE — Progress Notes (Signed)
 PHARMACY - ANTICOAGULATION CONSULT NOTE  Pharmacy Consult for IV heparin  Indication: pulmonary embolus  Allergies[1]  Patient Measurements: Height: 5' 10 (177.8 cm) Weight: 67.5 kg (148 lb 13 oz) IBW/kg (Calculated) : 73  Vital Signs: Temp: 98.4 F (36.9 C) (12/14 1705) Temp Source: Oral (12/14 1130) BP: 160/76 (12/14 1705) Pulse Rate: 80 (12/14 1705)  Labs: Recent Labs    11/22/24 0344 11/22/24 1221 11/22/24 1735 11/22/24 1844 11/23/24 0208 11/24/24 0130  HGB 8.4*   < > 6.8* 6.9* 7.5* 8.3*  HCT 24.3*   < > 19.8* 20.7* 21.6* 23.4*  PLT 258  --  210 217 264 276  APTT 87*  --   --   --  38*  --   HEPARINUNFRC >1.10*  --   --   --  >1.10*  --   CREATININE 2.95*   < > 3.43*  --  3.73* 4.09*  TROPONINIHS  --   --  25*  --   --   --    < > = values in this interval not displayed.    Estimated Creatinine Clearance: 16 mL/min (A) (by C-G formula based on SCr of 4.09 mg/dL (H)).   Medical History: Past Medical History:  Diagnosis Date   Acute diastolic heart failure (HCC) 08/18/2018   AKI (acute kidney injury) 10/04/2023   Atherosclerosis of both carotid arteries 08/18/2020   Carotid dopplers 08/2019 40-59% bilateral stenosis and right subclavian stenosis   Bilateral carotid artery stenosis 08/18/2020   Right Carotid: Velocities in the right ICA are consistent with a 60-79%                 stenosis. Non-hemodynamically significant plaque <50% noted in                 the CCA. The ECA appears >50% stenosed. Proximal subclavian                 artery dilatation with elevated and turbulent flow just past the                 narrowing, measuring 1.6 cm.   Left Carotid: Velocities in the left ICA are    CHF (congestive heart failure) (HCC)    Chronic congestive heart failure (HCC) 10/04/2023   Transthoracic echocardiogram 10/08/2018: LVEF is approximately 50% with hypokinesis of the lateral (base/mid) and basal inferior walls T     Chronic left shoulder pain 03/18/2021    Chronic low back pain without sciatica 10/16/2008   H/o chronic LBP with h/o lumbar disc herniation and intermittent radicular pain  Chronic pain, on stable doses of Norco 10/325 TID. MRI in 2003 showing SMALL CENTRAL DISC HERNIATION AT L4-5.  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      CKD (chronic kidney disease), stage III (HCC)    Coronary artery disease    Coronary artery disease involving native heart with angina pectoris, unspecified vessel or lesion type 08/10/2023   COVID 10/04/2023   Diabetes mellitus    DKA (diabetic ketoacidosis) (HCC) 10/03/2023   Dyspnea    Fall 10/04/2023   GERD (gastroesophageal reflux disease)    Hx of CABG 08/23/2018   LIMA to LAD, RIMA to RCA, SVG to OM3, EVH via right thigh   Hyperlipidemia    Hypertension    Hypertension associated with diabetes (HCC) 12/22/2008   Qualifier: Diagnosis of  By: Edrick MD, Susan     Hypertensive nephropathy 08/18/2020   Insulin  dependent type  2 diabetes mellitus (HCC) 12/22/2020   Left ventricular dysfunction 08/18/2020   TTE ([id number]): LVEF is approximately 50% with hypokinesis of the lateral (base/mid) and basal inferior walls The cavity size was normal.      Long term (current) use of antithrombotics/antiplatelets 09/21/2022   Planned 27-months DAPT for DES for PAD on 09/21/2022.  Planned 6 months therapy. End 03/23/23.   Peripheral artery disease 08/31/2021   [facility] Cardiology Vascular Study lower extremities 08/30/21:            Today's ABI  Today's TBI   Previous ABIPrevious TBI  +-------+-----------+-----------+------------+------------+  Right  0.76       0.31       1.03                      +-------+-----------+-----------+------------+------------+  Left   0.63       0.20       0.95                      +-------+-----------+---------   Proteinuria 08/18/2020   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   LIMA to LAD, RIMA to RCA, SVG to OM3, EVH via right thigh   Sleeping difficulties  09/08/2019   Smoking greater than 20 pack years 08/22/2024   Subclavian artery stenosis, right 08/18/2020   Carotid dopplers 08/2019 40-59% bilateral stenosis and right subclavian stenosis   Tobacco abuse    Tobacco abuse disorder    Qualifier: Diagnosis of  By: Edrick MD, Devere     Assessment: Colin Keller is a 68 y.o. year old male admitted on 11/19/2024 with concern for cholecystitis s/p ERCP w/ stenting on 12/12 and percutaneous cholecystostomy by IR on 12/14. Recently diagnosed with new PE (11/15/24) and started on eliquis . Last dose eliquis  prior to admission 12/8 @ 9:30 pm. Pharmacy consulted to dose heparin  (no bolus d/t required pRBC transfusion, last given 12/12). Will trend aPTT until correlating with anti-xa levels with the presence of DOAC prior. Heparin  previously therapeutic on 1050 units/hr.  Goal of Therapy:  Heparin  level 0.3-0.7 units/ml aPTT 66-102 seconds Monitor platelets by anticoagulation protocol: Yes   Plan:  Heparin  infusion at 1050 units/hr (no bolus) 8h aPTT  Daily aPTT, heparin  level, CBC, and monitoring for bleeding F/u plans for anticoagulation   Thank you for allowing pharmacy to participate in this patient's care.  [name], PharmD Clinical Pharmacist 11/24/2024,6:57 PM       [1]  Allergies Allergen Reactions   Amlodipine  Other (See Comments)    Leg swelling   Empagliflozin  Other (See Comments)    Euglycemic DKA   Amitriptyline  Other (See Comments)    Urinary retention   Lisinopril  Other (See Comments)    Hyperkalemia    Nortriptyline  Hcl Other (See Comments)    Vivid / bad dreams   Statins     Aches in Joints    Simvastatin  Other (See Comments)    Arthralgias

## 2024-11-24 NOTE — Progress Notes (Signed)
 Progress Note   Subjective  Patient had an episode of vomiting this AM and has had some recurrent pain. WBC uptrending to 18 today and rise in liver enzymes.    Objective   Vital signs in last 24 hours: Temp:  [97 F (36.1 C)-98 F (36.7 C)] 98 F (36.7 C) (12/14 0709) Pulse Rate:  [39-84] 77 (12/14 0700) Resp:  [12-23] 13 (12/14 0700) BP: (130-181)/(67-98) 130/98 (12/14 0700) SpO2:  [95 %-99 %] 96 % (12/14 0700) Last BM Date : 11/19/24 General:    AA male in NAD Abdomen:  Soft, some abdominal TTP in RUQ  Psych:  Cooperative. Normal mood and affect.  Intake/Output from previous day: 12/13 0701 - 12/14 0700 In: 996.5 [P.O.:720; IV Piggyback:276.5] Out: 1300 [Urine:1300] Intake/Output this shift: Total I/O In: 50 [IV Piggyback:50] Out: -   Lab Results: Recent Labs    11/22/24 1844 11/23/24 0208 11/24/24 0130  WBC 16.7* 16.7* 18.8*  HGB 6.9* 7.5* 8.3*  HCT 20.7* 21.6* 23.4*  PLT 217 264 276   BMET Recent Labs    11/22/24 1735 11/23/24 0208 11/24/24 0130  NA 136 135 132*  K 3.7 3.7 3.5  CL 101 100 94*  CO2 22 23 23   GLUCOSE 237* 194* 214*  BUN 54* 61* 74*  CREATININE 3.43* 3.73* 4.09*  CALCIUM  7.9* 8.2* 8.0*   LFT Recent Labs    11/24/24 0822  PROT 6.0*  ALBUMIN  1.9*  AST 347*  ALT 211*  ALKPHOS 1,158*  BILITOT 3.2*  BILIDIR 1.8*  IBILI 1.4*   PT/INR No results for input(s): LABPROT, INR in the last 72 hours.  Studies/Results: ECHOCARDIOGRAM LIMITED Result Date: 11/23/2024    ECHOCARDIOGRAM LIMITED REPORT   Patient Name:   Colin Keller Date of Exam: 11/23/2024 Medical Rec #:  [id number]     Height:       70.0 in Accession #:    [id number]    Weight:       148.8 lb Date of Birth:  01/19/1956     BSA:          1.841 m Patient Age:    68 years      BP:           131/69 mmHg Patient Gender: M             HR:           59 bpm. Exam Location:  Inpatient Procedure: Limited Echo (Both Spectral and Color Flow Doppler were utilized             during procedure). Indications:    R94.31 Abnormal EKG  History:        Patient has prior history of Echocardiogram examinations, most                 recent 11/16/2024. CAD.  Sonographer:    Nathanel Devonshire Referring Phys: [id number] TINNIE FORBES FURTH IMPRESSIONS  1. Limited echo with limited images  2. Left ventricular ejection fraction, by estimation, is 50 to 55%. Left ventricular ejection fraction by 2D MOD biplane is 52.0 %. The left ventricle has low normal function. The left ventricle demonstrates global hypokinesis. There is mild left ventricular hypertrophy. Left ventricular diastolic parameters are consistent with Grade III diastolic dysfunction (restrictive). Elevated left ventricular end-diastolic pressure.  3. The mitral valve is degenerative. Trivial mitral valve regurgitation.  4. The inferior vena cava is normal in size with <50% respiratory variability, suggesting right atrial pressure  of 8 mmHg. Comparison(s): Changes from prior study are noted. 11/16/2024: LVEF 45-50%, grade III DD. FINDINGS  Left Ventricle: Left ventricular ejection fraction, by estimation, is 50 to 55%. Left ventricular ejection fraction by 2D MOD biplane is 52.0 %. The left ventricle has low normal function. The left ventricle demonstrates global hypokinesis. There is mild left ventricular hypertrophy. Left ventricular diastolic parameters are consistent with Grade III diastolic dysfunction (restrictive). Elevated left ventricular end-diastolic pressure. Mitral Valve: The mitral valve is degenerative in appearance. Mild to moderate mitral annular calcification. Trivial mitral valve regurgitation. Venous: The inferior vena cava is normal in size with less than 50% respiratory variability, suggesting right atrial pressure of 8 mmHg.  LV Volumes (MOD)               Biplane EF (MOD) LV vol d, MOD    131.0 ml      LV Biplane EF:   Left A2C:                                            ventricular LV vol d, MOD    145.0 ml                        ejection A4C:                                            fraction by LV vol s, MOD    60.3 ml                        2D MOD A2C:                                            biplane is LV vol s, MOD    73.1 ml                        52.0 %. A4C: LV SV MOD A2C:   70.7 ml       Diastology LV SV MOD A4C:   145.0 ml      LV e' medial:   3.48 cm/s LV SV MOD BP:    73.1 ml       LV E/e' medial: 34.8  MITRAL VALVE MV Area (PHT): 4.63 cm MV Decel Time: 164 msec MV E velocity: 121.00 cm/s MV A velocity: 51.90 cm/s MV E/A ratio:  2.33 Vinie Maxcy MD Electronically signed by Vinie Maxcy MD Signature Date/Time: 11/23/2024/12:26:13 PM    Final    DG C-Arm 1-60 Min-No Report Result Date: 11/22/2024 Fluoroscopy was utilized by the requesting physician.  No radiographic interpretation.   DG C-Arm 1-60 Min-No Report Result Date: 11/22/2024 Fluoroscopy was utilized by the requesting physician.  No radiographic interpretation.       Assessment / Plan:    68 y/o male here with the following:   Biliary stricture / biliary obstruction - s/p ERCP with stenting 12/12 ? cholecystitis Abnormal imaging pancreas - limited by noncontrast study Pulmonary embolism - on heparin  drip - held   ERCP 12/12 confirmed CBD stricture. CBD stented per  Dr. Abran - no purulent material noted, however his liver enzymes and WBC improved post ERCP yesterday. We have suspected he may also have a compnent of cholecystitis contributing to this presentation. On antibiotics. Surgery does not think a good candidate for cholecystectomy currently.   Unfortunately overnight his WBC has risen, LAEs worse, clinically he looks worse - recurrent pain / vomiting. Suspect cholecystitis. Given he is not a good surgical candidate, agree with general surgery to consult IR for cholecystostomy tube. Heparin  drip has been held, NPO. Hopefully they can do it today.  I had a long conversation with the patient's wife about the changes this AM. She agrees  with IR consult about possible cholecystostomy tube.   Otherwise, unclear cause of biliary stricture at this time, need to rule out malignancy. Technically challenging ERCP, brushings not obtained. He will need further evaluation of his pancreas / biliary tree. Ideally EUS would be next step, unclear availability for that this upcoming week, will discuss with my advanced endoscopy colleagues and inpatient team who is coming on service tomorrow. Hopefully this may be possible for him this week. If EUS not available in near future, consider MRCP with contrast if safe per radiology (acute on chronic CKD).    Dr. Albertus to assume inpatient GI care tomorrow.    This encounter included highly complex medical decision making and included time reviewing the chart, face-to-face time with the patient, and documenting the encounter, and time spent discussing with the primary team.    Marcey Naval, MD [facility]

## 2024-11-25 ENCOUNTER — Encounter

## 2024-11-25 ENCOUNTER — Inpatient Hospital Stay (HOSPITAL_COMMUNITY)

## 2024-11-25 DIAGNOSIS — Z794 Long term (current) use of insulin: Secondary | ICD-10-CM

## 2024-11-25 DIAGNOSIS — E1121 Type 2 diabetes mellitus with diabetic nephropathy: Secondary | ICD-10-CM

## 2024-11-25 LAB — GLUCOSE, CAPILLARY
Glucose-Capillary: 126 mg/dL — ABNORMAL HIGH (ref 70–99)
Glucose-Capillary: 207 mg/dL — ABNORMAL HIGH (ref 70–99)
Glucose-Capillary: 227 mg/dL — ABNORMAL HIGH (ref 70–99)
Glucose-Capillary: 257 mg/dL — ABNORMAL HIGH (ref 70–99)
Glucose-Capillary: 312 mg/dL — ABNORMAL HIGH (ref 70–99)
Glucose-Capillary: 85 mg/dL (ref 70–99)
Glucose-Capillary: 92 mg/dL (ref 70–99)

## 2024-11-25 LAB — COMPREHENSIVE METABOLIC PANEL WITH GFR
ALT: 199 U/L — ABNORMAL HIGH (ref 0–44)
AST: 168 U/L — ABNORMAL HIGH (ref 15–41)
Albumin: 1.6 g/dL — ABNORMAL LOW (ref 3.5–5.0)
Alkaline Phosphatase: 948 U/L — ABNORMAL HIGH (ref 38–126)
Anion gap: 14 (ref 5–15)
BUN: 77 mg/dL — ABNORMAL HIGH (ref 8–23)
CO2: 17 mmol/L — ABNORMAL LOW (ref 22–32)
Calcium: 8 mg/dL — ABNORMAL LOW (ref 8.9–10.3)
Chloride: 101 mmol/L (ref 98–111)
Creatinine, Ser: 3.98 mg/dL — ABNORMAL HIGH (ref 0.61–1.24)
GFR, Estimated: 15 mL/min — ABNORMAL LOW (ref >60)
Glucose, Bld: 296 mg/dL — ABNORMAL HIGH (ref 70–99)
Potassium: 4.1 mmol/L (ref 3.5–5.1)
Sodium: 132 mmol/L — ABNORMAL LOW (ref 135–145)
Total Bilirubin: 2.7 mg/dL — ABNORMAL HIGH (ref 0.0–1.2)
Total Protein: 5.1 g/dL — ABNORMAL LOW (ref 6.5–8.1)

## 2024-11-25 LAB — CBC WITH DIFFERENTIAL/PLATELET
Abs Immature Granulocytes: 0.16 K/uL — ABNORMAL HIGH (ref 0.00–0.07)
Basophils Absolute: 0 K/uL (ref 0.0–0.1)
Basophils Relative: 0 %
Eosinophils Absolute: 0.1 K/uL (ref 0.0–0.5)
Eosinophils Relative: 1 %
HCT: 23.2 % — ABNORMAL LOW (ref 39.0–52.0)
Hemoglobin: 7.9 g/dL — ABNORMAL LOW (ref 13.0–17.0)
Immature Granulocytes: 1 %
Lymphocytes Relative: 7 %
Lymphs Abs: 0.9 K/uL (ref 0.7–4.0)
MCH: 31 pg (ref 26.0–34.0)
MCHC: 34.1 g/dL (ref 30.0–36.0)
MCV: 91 fL (ref 80.0–100.0)
Monocytes Absolute: 1.2 K/uL — ABNORMAL HIGH (ref 0.1–1.0)
Monocytes Relative: 9 %
Neutro Abs: 11.7 K/uL — ABNORMAL HIGH (ref 1.7–7.7)
Neutrophils Relative %: 82 %
Platelets: 247 K/uL (ref 150–400)
RBC: 2.55 MIL/uL — ABNORMAL LOW (ref 4.22–5.81)
RDW: 15.1 % (ref 11.5–15.5)
WBC: 14 K/uL — ABNORMAL HIGH (ref 4.0–10.5)
nRBC: 0 % (ref 0.0–0.2)

## 2024-11-25 LAB — HEPATITIS PANEL, ACUTE
HCV Ab: NONREACTIVE
Hep A IgM: NONREACTIVE
Hep B C IgM: NONREACTIVE
Hepatitis B Surface Ag: NONREACTIVE

## 2024-11-25 LAB — APTT: aPTT: 66 s — ABNORMAL HIGH (ref 24–36)

## 2024-11-25 LAB — HEPARIN LEVEL (UNFRACTIONATED): Heparin Unfractionated: 0.44 [IU]/mL (ref 0.30–0.70)

## 2024-11-25 MED ORDER — OXYCODONE HCL 5 MG PO TABS
5.0000 mg | ORAL_TABLET | Freq: Once | ORAL | Status: AC
  Administered 2024-11-25: 03:00:00 5 mg via ORAL
  Filled 2024-11-25: qty 1

## 2024-11-25 MED ORDER — INSULIN GLARGINE 100 UNIT/ML ~~LOC~~ SOLN
5.0000 [IU] | Freq: Once | SUBCUTANEOUS | Status: DC
  Filled 2024-11-25: qty 0.05

## 2024-11-25 MED ORDER — INSULIN GLARGINE 100 UNIT/ML ~~LOC~~ SOLN
15.0000 [IU] | Freq: Every day | SUBCUTANEOUS | Status: DC
  Administered 2024-11-26 – 2024-11-27 (×2): 15 [IU] via SUBCUTANEOUS
  Filled 2024-11-25 (×2): qty 0.15

## 2024-11-25 MED ORDER — SODIUM CHLORIDE 0.9% FLUSH
5.0000 mL | Freq: Three times a day (TID) | INTRAVENOUS | Status: DC
  Administered 2024-11-25 – 2024-12-06 (×33): 5 mL

## 2024-11-25 MED ORDER — LACTATED RINGERS IV SOLN: INTRAVENOUS | Status: DC

## 2024-11-25 MED ORDER — OXYCODONE HCL 5 MG PO TABS
5.0000 mg | ORAL_TABLET | Freq: Four times a day (QID) | ORAL | Status: DC | PRN
  Administered 2024-11-25 – 2024-12-04 (×18): 5 mg via ORAL
  Filled 2024-11-25 (×19): qty 1

## 2024-11-25 MED ORDER — METRONIDAZOLE 500 MG PO TABS
500.0000 mg | ORAL_TABLET | Freq: Two times a day (BID) | ORAL | Status: AC
  Administered 2024-11-25 – 2024-11-28 (×7): 500 mg via ORAL
  Filled 2024-11-25 (×7): qty 1

## 2024-11-25 NOTE — Plan of Care (Signed)
 FMTS Interim Progress Note  S:Seen at bedside. Doing much better today. Was able to walk up and down the hall twice. Drain site pain controlled. More lower back pain from lying in the bed. Updated wife over the phone about renal US  results and plans for EUS in the AM.  O: BP (!) 156/74   Pulse 73   Temp 97.7 F (36.5 C) (Oral)   Resp 18   Ht 5' 10 (1.778 m)   Wt 67.5 kg   SpO2 98%   BMI 21.35 kg/m   GEN: Alert, NAD, conversant CAR: Regular rate RES: Breathing and speaking comfortably on RA EXT: Moves all grossly equally  A/P: Cholecystitis s/p perc chole Stable. Will give heating pad for lower back pain, otherwise we will continue pain regimen. EUS in AM; he is NPO at MN and heparin  to stop at 2 AM with placement of SCDs.  AKI with proteinuria Holding ASA. Plan for biopsy on Monday after washout. Renal US  this PM limited but did not show hydronephrosis.  Tharon Lung, MD 11/25/2024, 8:57 PM PGY-3, [facility] Family Medicine Service pager [phone]

## 2024-11-25 NOTE — Consult Note (Addendum)
 Chief Complaint: Patient was seen in consultation today for proteinuria, with consideration for non-target renal biopsy.  Referring Provider(s): Dr. Ephriam Stank, MD   Supervising Physician: Luverne Aran  Patient Status: [facility] - In-pt  Patient is Full Code  History of Present Illness: Colin Keller is a 68 y.o. male  with PMHx notable for CKD stage III, proteinuria, AKI, HTN, HLD, DMT2, CAD, CHF, COPD, tobacco use, and others as delineated below.  Patient is known to IR, having most recently undergone PERC Coley placement on 12/14, by Dr. Jennefer.  Per Dr. Aureliano progress note dated today: AKI on CKD3B Nephrotic Syndrome -baseline Cr ~1.9-2.2, followed by Dr. Rayburn OP. Has baseline nephrotic range proteinuria -AKI presumed to be pre-renal injury and possibly zosyn  related AKI. Zosyn  stopped. 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 Nonoliguric -renal ultrasound pending. Urine sediment relatively bland -will need to get in our OP EMR to see what work up he has had in the past for his nephrotic syndrome. Suspecting this is driven by uncontrolled DM but I believe we should move forward with a renal biopsy while he's here. He is on aspirin , will d/w primary service in regards to holding this (will need 5 day washout). Discussed with wife at the bedside and she is in agreement. In the interim, will proceed with serological work up (hepatitis panel, ANA, ANCA, SPEP/FLC/UPEP, PLA2R) -no indications to start renal replacement therapy [...].   Interventional Radiology was requested for random renal biopsy. Patient is tentatively scheduled for same 12/22 due to aspirin  washout.   Patient is alert and laying in bed, calm.  His wife is at the bedside. Patient is currently complaining of abdominal pain from recent drain placement.  His tenderness is worse with cough and deep breaths. Patient denies any fevers, headache, chest  pain, SOB, cough, nausea, vomiting or bleeding.    Past Medical History:  Diagnosis Date   Acute diastolic heart failure (HCC) 08/18/2018   AKI (acute kidney injury) 10/04/2023   Atherosclerosis of both carotid arteries 08/18/2020   Carotid dopplers 08/2019 40-59% bilateral stenosis and right subclavian stenosis   Bilateral carotid artery stenosis 08/18/2020   Right Carotid: Velocities in the right ICA are consistent with a 60-79%                 stenosis. Non-hemodynamically significant plaque <50% noted in                 the CCA. The ECA appears >50% stenosed. Proximal subclavian                 artery dilatation with elevated and turbulent flow just past the                 narrowing, measuring 1.6 cm.   Left Carotid: Velocities in the left ICA are    CHF (congestive heart failure) (HCC)    Chronic congestive heart failure (HCC) 10/04/2023   Transthoracic echocardiogram 10/08/2018: LVEF is approximately 50% with hypokinesis of the lateral (base/mid) and basal inferior walls T     Chronic left shoulder pain 03/18/2021   Chronic low back pain without sciatica 10/16/2008   H/o chronic LBP with h/o lumbar disc herniation and intermittent radicular pain  Chronic pain, on stable doses of Norco 10/325 TID. MRI in 2003 showing SMALL CENTRAL DISC HERNIATION AT L4-5.  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  CKD (chronic kidney disease), stage III (HCC)    Coronary artery disease    Coronary artery disease involving native heart with angina pectoris, unspecified vessel or lesion type 08/10/2023   COVID 10/04/2023   Diabetes mellitus    DKA (diabetic ketoacidosis) (HCC) 10/03/2023   Dyspnea    Fall 10/04/2023   GERD (gastroesophageal reflux disease)    Hx of CABG 08/23/2018   LIMA to LAD, RIMA to RCA, SVG to OM3, EVH via right thigh   Hyperlipidemia    Hypertension    Hypertension associated with diabetes (HCC) 12/22/2008   Qualifier: Diagnosis of  By: Edrick  MD, Susan     Hypertensive nephropathy 08/18/2020   Insulin  dependent type 2 diabetes mellitus (HCC) 12/22/2020   Left ventricular dysfunction 08/18/2020   TTE ([id number]): LVEF is approximately 50% with hypokinesis of the lateral (base/mid) and basal inferior walls The cavity size was normal.      Long term (current) use of antithrombotics/antiplatelets 09/21/2022   Planned 22-months DAPT for DES for PAD on 09/21/2022.  Planned 6 months therapy. End 03/23/23.   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   LIMA to LAD, RIMA to RCA, SVG to OM3, EVH via right thigh   Sleeping difficulties 09/08/2019   Smoking greater than 20 pack years 08/22/2024   Subclavian artery stenosis, right 08/18/2020   Carotid dopplers 08/2019 40-59% bilateral stenosis and right subclavian stenosis   Tobacco abuse    Tobacco abuse disorder    Qualifier: Diagnosis of  By: Edrick MD, Devere      Past Surgical History:  Procedure Laterality Date   ABDOMINAL AORTOGRAM W/LOWER EXTREMITY N/A 09/21/2022   Procedure: ABDOMINAL AORTOGRAM W/LOWER EXTREMITY;  Surgeon: Darron Deatrice LABOR, MD;  Location: [facility] INVASIVE CV LAB;  Service: Cardiovascular;  Laterality: N/A;   BILIARY STENT PLACEMENT  11/22/2024   Procedure: INSERTION, STENT, BILE DUCT;  Surgeon: Abran Norleen SAILOR, MD;  Location: [facility] ENDOSCOPY;  Service: Gastroenterology;;   CORONARY ARTERY BYPASS GRAFT N/A 08/23/2018   Procedure: CORONARY ARTERY BYPASS GRAFTING (CABG) x 3; -Left Internal Mammary Artery to Left Anterior Descending  Artery, -Right Internal Mammary Artery to Right Coronary Artery, -Saphenous Vein Graft to Obtuse Marginal;  ENDOSCOPIC HARVEST GREATER SAPHENOUS VEIN  -Right Thigh;  Surgeon: Dusty Sudie DEL, MD;  Location: [facility] OR;  Service: Open Heart Surgery;  Laterality: N/A;   CORONARY PRESSURE/FFR STUDY N/A 08/20/2018   Procedure: INTRAVASCULAR PRESSURE WIRE/FFR STUDY;  Surgeon: Wonda Sharper, MD;  Location: [facility] INVASIVE CV LAB;  Service: Cardiovascular;  Laterality: N/A;   ERCP N/A 11/22/2024   Procedure: ERCP, WITH INTERVENTION IF INDICATED;  Surgeon: Abran Norleen SAILOR, MD;  Location: [facility] ENDOSCOPY;  Service: Gastroenterology;  Laterality: N/A;   IR PERC CHOLECYSTOSTOMY  11/24/2024   LEFT HEART CATH AND CORONARY  ANGIOGRAPHY N/A 08/20/2018   Procedure: LEFT HEART CATH AND CORONARY ANGIOGRAPHY;  Surgeon: Wonda Sharper, MD;  Location: [facility] INVASIVE CV LAB;  Service: Cardiovascular;  Laterality: N/A;   PERIPHERAL VASCULAR ATHERECTOMY Left 09/21/2022   Procedure: PERIPHERAL VASCULAR ATHERECTOMY;  Surgeon: Darron Deatrice LABOR, MD;  Location: [facility] INVASIVE CV LAB;  Service: Cardiovascular;  Laterality: Left;  SFA   PERIPHERAL VASCULAR BALLOON ANGIOPLASTY Left 09/21/2022   Procedure: PERIPHERAL VASCULAR BALLOON ANGIOPLASTY;  Surgeon: Darron Deatrice LABOR, MD;  Location: [facility] INVASIVE CV LAB;  Service: Cardiovascular;  Laterality: Left;  SFA   SPHINCTEROTOMY  11/22/2024   Procedure: SPHINCTEROTOMY, BILIARY;  Surgeon: Abran Norleen SAILOR, MD;  Location: [facility] ENDOSCOPY;  Service: Gastroenterology;;   TEE WITHOUT CARDIOVERSION N/A 08/23/2018   Procedure: TRANSESOPHAGEAL ECHOCARDIOGRAM (TEE);  Surgeon: Dusty Sudie DEL, MD;  Location: [facility] OR;  Service: Open Heart Surgery;  Laterality: N/A;    Allergies: Amlodipine , Empagliflozin , Amitriptyline , Lisinopril , Nortriptyline  hcl, Statins, and Simvastatin   Medications: Prior to Admission medications  Medication Sig Start Date End Date Taking? Authorizing Provider  APIXABAN  (ELIQUIS ) VTE  STARTER PACK (10MG  AND 5MG ) Take as directed on package: start with two-5mg  tablets twice daily for 7 days. On day 8, switch to one-5mg  tablet twice daily. 11/16/24  Yes Gherghe, Costin M, MD  aspirin  81 MG tablet Take 81 mg by mouth daily.   Yes [provider]  carvedilol  (COREG ) 6.25 MG tablet Take 1 tablet (6.25 mg total) by mouth 2 (two) times daily with a meal. 11/17/24  Yes Elicia Hamlet, MD  cetirizine (ZYRTEC) 10 MG chewable tablet Chew 10 mg by mouth as needed for allergies.   Yes [provider]  diltiazem  (CARDIZEM  CD) 180 MG 24 hr capsule Take 180 mg by mouth daily. 10/29/24  Yes [provider]  Evolocumab  (REPATHA  SURECLICK) 140 MG/ML SOAJ Inject 140 mg into the skin every 14 (fourteen) days. 03/12/24  Yes Darron Deatrice LABOR, MD  furosemide  (LASIX ) 40 MG tablet Take 0.5 tablets (20 mg total) by mouth daily. 11/18/24  Yes Elicia Hamlet, MD  Glucagon  (BAQSIMI  TWO PACK) 3 MG/DOSE POWD Use as directed PRN low glucose 10/30/24  Yes McDiarmid, Krystal BIRCH, MD  hydrALAZINE  (APRESOLINE ) 50 MG tablet Take 1 tablet (50 mg total) by mouth 3 (three) times daily. 08/22/24 08/17/25 Yes McDiarmid, Krystal BIRCH, MD  hydrochlorothiazide  (HYDRODIURIL ) 12.5 MG tablet Take 1 tablet (12.5 mg total) by mouth daily. 10/30/24  Yes McDiarmid, Krystal BIRCH, MD  HYDROcodone -acetaminophen  (NORCO) 10-325 MG tablet TAKE 1 TABLET BY MOUTH TWICE DAILY AS NEEDED Patient taking differently: Take 1 tablet by mouth in the morning and at bedtime. 11/06/24  Yes McDiarmid, Krystal BIRCH, MD  Insulin  Glargine (BASAGLAR  KWIKPEN) 100 UNIT/ML DIAL AND INJECT 20 UNITS UNDER THE SKIN DAILY. Patient taking differently: Inject 10-12 Units into the skin daily. 08/19/24  Yes McDiarmid, Krystal BIRCH, MD  insulin  lispro (HUMALOG ) 100 UNIT/ML injection Inject 0.03-0.05 mLs (3-5 Units total) into the skin 3 (three) times daily with meals. 3-4 units prior to breakfast, 4-5 units prior to evening meal 08/08/24 02/04/25 Yes McDiarmid, Krystal BIRCH, MD  metFORMIN   (GLUCOPHAGE ) 500 MG tablet Take 2 tablets (1,000 mg total) by mouth 2 (two) times daily with a meal. TAKE 2 TABLETS BY MOUTH TWICE DAILY WITH A MEAL 10/23/23 11/15/25 Yes McDiarmid, Krystal BIRCH, MD  pantoprazole  (PROTONIX ) 40 MG tablet Take 1 tablet (40 mg total) by mouth daily. 11/18/24  Yes Elicia Hamlet, MD  promethazine  (PHENERGAN ) 25 MG tablet Take 1 tablet (25 mg  total) by mouth every 6 (six) hours as needed for nausea or vomiting. Patient taking differently: Take 12.5-25 mg by mouth every 6 (six) hours as needed for nausea or vomiting. 09/24/24  Yes McDiarmid, Krystal BIRCH, MD  tamsulosin  (FLOMAX ) 0.4 MG CAPS capsule Take 1 capsule (0.4 mg total) by mouth daily. 02/20/24  Yes McDiarmid, Krystal BIRCH, MD  apixaban  (ELIQUIS ) 5 MG TABS tablet Take 1 tablet (5 mg total) by mouth 2 (two) times daily. To begin after completion of Eliquis  (apixaban ) VTE starter pack Patient not taking: Reported on 11/19/2024 12/17/24   Trixie Nilda HERO, MD     Family History  Problem Relation Age of Onset   Heart disease Mother    Diabetes Mother    Heart failure Mother    Heart disease Father    Sudden death Brother    Drug abuse Son     Social History   Socioeconomic History   Marital status: Married    Spouse name: Katheryn    Number of children: 2   Years of education: 10   Highest education level: 10th grade  Occupational History   Occupation: music therapist  Tobacco Use   Smoking status: Every Day    Current packs/day: 0.00    Average packs/day: 1 pack/day for 48.0 years (48.0 ttl pk-yrs)    Types: Cigarettes    Start date: 08/12/1970    Last attempt to quit: 08/12/2018    Years since quitting: 6.2   Smokeless tobacco: Never   Tobacco comments:    Quit post CABG in 2019 - previous 1ppd  Vaping Use   Vaping status: Never Used  Substance and Sexual Activity   Alcohol  use: No    Alcohol /week: 0.0 standard drinks of alcohol    Drug use: No   Sexual activity: Yes    Birth control/protection: Post-menopausal  Other Topics  Concern   Not on file  Social History Narrative   Lives with his wife in [place].    Wife Katheryn is also [facility] patient.   Works odd-jobs in holiday representative.    Previously used marijuana -quit 10/03. Previous 1ppd smoker- quit 2019.   Surrogate decision maker: Uriah Philipson (wife)   Patient has one daughter.   Patients son died of a drug overdose Mar 04, 2021.   Patient has 2 dogs and 2 cats.       Social Drivers of Health   Tobacco Use: High Risk (11/22/2024)   Patient History    Smoking Tobacco Use: Every Day    Smokeless Tobacco Use: Never    Passive Exposure: Not on file  Financial Resource Strain: Low Risk (08/07/2023)   Overall Financial Resource Strain (CARDIA)    Difficulty of Paying Living Expenses: Not hard at all  Food Insecurity: No Food Insecurity (11/19/2024)   Epic    Worried About Programme Researcher, Broadcasting/film/video in the Last Year: Never true    Ran Out of Food in the Last Year: Never true  Transportation Needs: No Transportation Needs (11/19/2024)   Epic    Lack of Transportation (Medical): No    Lack of Transportation (Non-Medical): No  Physical Activity: Sufficiently Active (08/07/2023)   Exercise Vital Sign    Days of Exercise per Week: 5 days    Minutes of Exercise per Session: 30 min  Stress: No Stress Concern Present (08/07/2023)   [employer] of Occupational Health - Occupational Stress Questionnaire    Feeling of Stress : Not at all  Social Connections: Moderately Isolated (11/19/2024)   Social  Connection and Isolation Panel    Frequency of Communication with Friends and Family: More than three times a week    Frequency of Social Gatherings with Friends and Family: Three times a week    Attends Religious Services: Never    Active Member of Clubs or Organizations: No    Attends Banker Meetings: Never    Marital Status: Married  Depression (PHQ2-9): Low Risk (08/22/2024)   Depression (PHQ2-9)    PHQ-2 Score: 0  Alcohol  Screen: Low Risk (08/07/2023)   Alcohol   Screen    Last Alcohol  Screening Score (AUDIT): 0  Housing: Low Risk (11/19/2024)   Epic    Unable to Pay for Housing in the Last Year: No    Number of Times Moved in the Last Year: 0    Homeless in the Last Year: No  Utilities: Not At Risk (11/19/2024)   Epic    Threatened with loss of utilities: No  Health Literacy: Adequate Health Literacy (08/07/2023)   B1300 Health Literacy    Frequency of need for help with medical instructions: Rarely     Review of Systems: A 12 point ROS discussed and pertinent positives are indicated in the HPI above.  All other systems are negative.  Vital Signs: BP (!) 127/55   Pulse 89   Temp 98.4 F (36.9 C)   Resp 19   Ht 5' 10 (1.778 m)   Wt 148 lb 13 oz (67.5 kg)   SpO2 97%   BMI 21.35 kg/m   Advance Care Plan: The advanced care place/surrogate decision maker was discussed at the time of visit and the patient did not wish to discuss or was not able to name a surrogate decision maker or provide an advance care plan.  Physical Exam Vitals reviewed.  Constitutional:      General: He is not in acute distress.    Appearance: Normal appearance.  HENT:     Mouth/Throat:     Mouth: Mucous membranes are moist.  Cardiovascular:     Rate and Rhythm: Normal rate and regular rhythm.     Pulses: Normal pulses.     Heart sounds: Normal heart sounds.  Pulmonary:     Effort: Pulmonary effort is normal.     Breath sounds: Normal breath sounds.  Abdominal:     General: Abdomen is flat. There is no distension.     Tenderness: There is abdominal tenderness.     Comments: RUQ drain appropriately dressed. Dressing is clean, dry, intact. Drain incision site non-tender, without evidence of infection. Retaining suture and Stat Lock in place.  30 mL blood-tinged bilious output in collection bulb. Line flushes well.    Musculoskeletal:        General: Normal range of motion.     Cervical back: Normal range of motion.  Skin:    General: Skin is warm and  dry.  Neurological:     Mental Status: He is alert and oriented to person, place, and time.  Psychiatric:        Mood and Affect: Mood normal.        Behavior: Behavior normal.        Thought Content: Thought content normal.        Judgment: Judgment normal.     Imaging: IR Perc Cholecystostomy Result Date: 11/25/2024 INDICATION: 68 year old male with concern for acute cholecystitis. EXAM: Fluoroscopic and ultrasound-guided percutaneous cholecystostomy tube placement MEDICATIONS: The patient was currently receiving intravenous antibiotics as an inpatient, no additional antibiotics  were administered. ANESTHESIA/SEDATION: Moderate (conscious) sedation was employed during this procedure. A total of Versed  2 mg and Fentanyl  100 mcg was administered intravenously. Moderate Sedation Time: 6 minutes. The patient's level of consciousness and vital signs were monitored continuously by radiology nursing throughout the procedure under my direct supervision. FLUOROSCOPY TIME:  One mGy reference air kerma COMPLICATIONS: None immediate. PROCEDURE: Informed written consent was obtained from the patient after a thorough discussion of the procedural risks, benefits and alternatives. All questions were addressed. Maximal Sterile Barrier Technique was utilized including caps, mask, sterile gowns, sterile gloves, sterile drape, hand hygiene and skin antiseptic. A timeout was performed prior to the initiation of the procedure. The patient was placed supine on the angiographic table. The patient's right upper quadrant was then prepped and draped in normal sterile fashion with maximum sterile barrier. Ultrasound demonstrates a distended gallbladder. Subdermal Local anesthesia was provided at the planned skin entry site. Under ultrasound guidance, deeper local anesthetic was provided through intercostal muscles and along the liver capsule. Ultrasound was used to puncture the gallbladder using a 18 gauge trocar needle via a  subcostal, transabdominal approach. A 0.035 inch exchange wire was placed in the tract was dilated. A 10 French multipurpose drainage catheter was advanced into the gallbladder lumen. Approximately 100 cc of bilious aspirate was a vacuo aided. Contrast injection demonstrated nondistended gallbladder without visualization of the cystic duct. The drain was then secured in place using a 0-silk suture and a Stayfix device. A sterile dressing was applied. The tube was placed to bulb suction. A culture was sent to the lab for analysis. The patient tolerated procedure well without evidence of immediate complication was transferred back to the floor in stable condition. IMPRESSION: Successful placement of percutaneous, transabdominal 10 French cholecystostomy tube. Ester Sides, MD [facility] Radiology Electronically Signed   By: Ester Sides M.D.   On: 11/25/2024 07:27   ECHOCARDIOGRAM LIMITED Result Date: 11/23/2024    ECHOCARDIOGRAM LIMITED REPORT   Patient Name:   Colin Keller Date of Exam: 11/23/2024 Medical Rec #:  [id number]     Height:       70.0 in Accession #:    [id number]    Weight:       148.8 lb Date of Birth:  [birth year]     BSA:          1.841 m Patient Age:    68 years      BP:           131/69 mmHg Patient Gender: M             HR:           59 bpm. Exam Location:  Inpatient Procedure: Limited Echo (Both Spectral and Color Flow Doppler were utilized            during procedure). Indications:    R94.31 Abnormal EKG  History:        Patient has prior history of Echocardiogram examinations, most                 recent 11/16/2024. CAD.  Sonographer:    Nathanel Devonshire Referring Phys: [id number] TINNIE FORBES FURTH IMPRESSIONS  1. Limited echo with limited images  2. Left ventricular ejection fraction, by estimation, is 50 to 55%. Left ventricular ejection fraction by 2D MOD biplane is 52.0 %. The left ventricle has low normal function. The left ventricle demonstrates  global hypokinesis. There is mild left ventricular hypertrophy. Left  ventricular diastolic parameters are consistent with Grade III diastolic dysfunction (restrictive). Elevated left ventricular end-diastolic pressure.  3. The mitral valve is degenerative. Trivial mitral valve regurgitation.  4. The inferior vena cava is normal in size with <50% respiratory variability, suggesting right atrial pressure of 8 mmHg. Comparison(s): Changes from prior study are noted. 11/16/2024: LVEF 45-50%, grade III DD. FINDINGS  Left Ventricle: Left ventricular ejection fraction, by estimation, is 50 to 55%. Left ventricular ejection fraction by 2D MOD biplane is 52.0 %. The left ventricle has low normal function. The left ventricle demonstrates global hypokinesis. There is mild left ventricular hypertrophy. Left ventricular diastolic parameters are consistent with Grade III diastolic dysfunction (restrictive). Elevated left ventricular end-diastolic pressure. Mitral Valve: The mitral valve is degenerative in appearance. Mild to moderate mitral annular calcification. Trivial mitral valve regurgitation. Venous: The inferior vena cava is normal in size with less than 50% respiratory variability, suggesting right atrial pressure of 8 mmHg.  LV Volumes (MOD)               Biplane EF (MOD) LV vol d, MOD    131.0 ml      LV Biplane EF:   Left A2C:                                            ventricular LV vol d, MOD    145.0 ml                       ejection A4C:                                            fraction by LV vol s, MOD    60.3 ml                        2D MOD A2C:                                            biplane is LV vol s, MOD    73.1 ml                        52.0 %. A4C: LV SV MOD A2C:   70.7 ml       Diastology LV SV MOD A4C:   145.0 ml      LV e' medial:   3.48 cm/s LV SV MOD BP:    73.1 ml       LV E/e' medial: 34.8  MITRAL VALVE MV Area (PHT): 4.63 cm MV Decel Time: 164 msec MV E velocity: 121.00 cm/s MV A velocity:  51.90 cm/s MV E/A ratio:  2.33 Vinie Maxcy MD Electronically signed by Vinie Maxcy MD Signature Date/Time: 11/23/2024/12:26:13 PM    Final    DG C-Arm 1-60 Min-No Report Result Date: 11/22/2024 Fluoroscopy was utilized by the requesting physician.  No radiographic interpretation.   DG C-Arm 1-60 Min-No Report Result Date: 11/22/2024 Fluoroscopy was utilized by the requesting physician.  No radiographic interpretation.   NM Hepatobiliary Liver Func Result Date: 11/20/2024 EXAM: NM HEPATOBILLARY SCAN 11/20/2024 04:18:31  PM TECHNIQUE: RADIOPHARMACEUTICAL: 7.5 mCi Tc-2m mebrofenin  Dynamic images of the abdomen and pelvis were obtained in the anterior projection for 90 minutes after intravenous administration of radiopharmaceutical. COMPARISON: MRI 11/19/2024. CLINICAL HISTORY: Cholecystitis. FINDINGS: There is uniform uptake within the liver. No activity is present within the common bile duct over the entirety of the exam. The gallbladder cannot be assessed without filling of the common duct. Findings concerning for distal common bile duct obstruction in light of the prior day MRCP. IMPRESSION: 1. Findings concerning for distal common bile duct obstruction in light of the prior day MRCP. Electronically signed by: Norleen Boxer MD 11/20/2024 04:26 PM EST RP Workstation: [id number]   MR ABDOMEN MRCP WO CONTRAST Result Date: 11/19/2024 CLINICAL DATA:  Abdominal pain. Gallbladder wall thickening and biliary ductal dilatation on recent ultrasound. EXAM: MRI ABDOMEN WITHOUT CONTRAST  (INCLUDING MRCP) TECHNIQUE: Multiplanar multisequence MR imaging of the abdomen was performed. Heavily T2-weighted images of the biliary and pancreatic ducts were obtained, and three-dimensional MRCP images were rendered by post processing. COMPARISON:  None Available. FINDINGS: Lower chest: No acute findings. Hepatobiliary:  No masses visualized on this unenhanced exam. The gallbladder is mildly distended and shows mild  diffuse wall thickening. No definite gallstones are seen. Pericholecystic edema is seen but is similar to diffuse mesenteric, retroperitoneal, and body wall edema seen throughout the abdomen. Diffuse biliary ductal dilatation is seen, with common bile duct measuring 14 mm in diameter. No evidence of choledocholithiasis. Stricture of the distal common bile duct is seen within the pancreatic head. Pancreas: Evaluation is limited by lack of intravenous contrast and some motion artifact. Multiple tiny cystic foci are seen within the pancreatic head at the site of distal common bile duct stricture. This may be due to chronic pancreatitis, however, pancreatic mass cannot definitely be excluded. No No evidence of main pancreatic ductal dilatation. Spleen:  Within normal limits in size. Adrenals/Urinary tract: Unremarkable. No evidence of nephrolithiasis or hydronephrosis. Stomach/Bowel: No dilated bowel loops. Diffuse mesenteric edema and minimal perihepatic ascites. Vascular/Lymphatic: No pathologically enlarged lymph nodes identified. No evidence of abdominal aortic aneurysm. Other:  None. Musculoskeletal:  No suspicious bone lesions identified. 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 No evidence of choledocholithiasis. Multiple tiny cystic foci within the pancreatic head at the site of distal common bile duct stricture. This may be due to chronic pancreatitis, however, pancreatic mass cannot definitely be excluded on this unenhanced exam. Recommend further imaging evaluation with pancreatic protocol abdomen CT without and with contrast Distended gallbladder with mild diffuse wall thickening, which is nonspecific. Differential diagnosis includes cholecystitis and other etiologies such as hypoalbuminemia, liver disease, or heart failure. Anasarca and minimal perihepatic ascites. Electronically Signed   By: Norleen DELENA Kil M.D.   On: 11/19/2024 17:48   MR 3D Recon At  Scanner Result Date: 11/19/2024 CLINICAL DATA:  Abdominal pain. Gallbladder wall thickening and biliary ductal dilatation on recent ultrasound. EXAM: MRI ABDOMEN WITHOUT CONTRAST  (INCLUDING MRCP) TECHNIQUE: Multiplanar multisequence MR imaging of the abdomen was performed. Heavily T2-weighted images of the biliary and pancreatic ducts were obtained, and three-dimensional MRCP images were rendered by post processing. COMPARISON:  None Available. FINDINGS: Lower chest: No acute findings. Hepatobiliary:  No masses visualized on this unenhanced exam. The gallbladder is mildly distended and shows mild diffuse wall thickening. No definite gallstones are seen. Pericholecystic edema is seen but is similar to diffuse mesenteric, retroperitoneal, and body wall edema seen throughout the abdomen. Diffuse biliary ductal dilatation is seen,  with common bile duct measuring 14 mm in diameter. No evidence of choledocholithiasis. Stricture of the distal common bile duct is seen within the pancreatic head. Pancreas: Evaluation is limited by lack of intravenous contrast and some motion artifact. Multiple tiny cystic foci are seen within the pancreatic head at the site of distal common bile duct stricture. This may be due to chronic pancreatitis, however, pancreatic mass cannot definitely be excluded. No No evidence of main pancreatic ductal dilatation. Spleen:  Within normal limits in size. Adrenals/Urinary tract: Unremarkable. No evidence of nephrolithiasis or hydronephrosis. Stomach/Bowel: No dilated bowel loops. Diffuse mesenteric edema and minimal perihepatic ascites. Vascular/Lymphatic: No pathologically enlarged lymph nodes identified. No evidence of abdominal aortic aneurysm. Other:  None. Musculoskeletal:  No suspicious bone lesions identified. 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 No evidence of choledocholithiasis. Multiple tiny cystic foci within the pancreatic head at the  site of distal common bile duct stricture. This may be due to chronic pancreatitis, however, pancreatic mass cannot definitely be excluded on this unenhanced exam. Recommend further imaging evaluation with pancreatic protocol abdomen CT without and with contrast Distended gallbladder with mild diffuse wall thickening, which is nonspecific. Differential diagnosis includes cholecystitis and other etiologies such as hypoalbuminemia, liver disease, or heart failure. Anasarca and minimal perihepatic ascites. Electronically Signed   By: Norleen DELENA Kil M.D.   On: 11/19/2024 17:48   US  Abdomen Limited RUQ (LIVER/GB) Result Date: 11/19/2024 EXAM: Right Upper Quadrant Abdominal Ultrasound 11/19/2024 11:48:11 AM TECHNIQUE: Real-time ultrasonography of the right upper quadrant of the abdomen was performed. COMPARISON: CT of the abdomen and pelvis dated 11/19/2024. CLINICAL HISTORY: Cholecystitis. FINDINGS: LIVER: The liver demonstrates normal echogenicity. No intrahepatic biliary ductal dilatation. No evidence of mass. BILIARY SYSTEM: Gallbladder wall thickness measures 7 mm. The patient is not focally tender over the gallbladder. No pericholecystic fluid. No cholelithiasis. The common bile duct is abnormally dilated, measuring 13.5 mm. No intrahepatic biliary ductal dilatation. OTHER: No right upper quadrant ascites. IMPRESSION: 1. Gallbladder wall thickening (7 mm) without focal tenderness. 2. Dilated common bile duct (13.5 mm) without intrahepatic biliary ductal dilatation. Electronically signed by: Evalene Coho MD 11/19/2024 12:10 PM EST RP Workstation: [id number]   CT ABDOMEN PELVIS WO CONTRAST Result Date: 11/19/2024 CLINICAL DATA:  Abdominal pain. EXAM: CT ABDOMEN AND PELVIS WITHOUT CONTRAST TECHNIQUE: Multidetector CT imaging of the abdomen and pelvis was performed following the standard protocol without IV contrast. RADIATION DOSE REDUCTION: This exam was performed according to the departmental  dose-optimization program which includes automated exposure control, adjustment of the mA and/or kV according to patient size and/or use of iterative reconstruction technique. COMPARISON:  06/20/2013 FINDINGS: Lower chest: Dependent atelectasis. Hepatobiliary: No suspicious focal abnormality in the liver on this study without intravenous contrast. Gallbladder lumen is filled with high density material. This is a new finding since chest CTA 11/15/2024 and likely reflects vicarious excretion of intravenous contrast administered for that study. Gallbladder wall appears circumferentially thickened. Intrahepatic biliary duct dilatation evident. Common bile duct not well demonstrated on this noncontrast exam. Pancreas: No focal mass lesion. No dilatation of the main duct. No intraparenchymal cyst. No peripancreatic edema. Spleen: No splenomegaly. No suspicious focal mass lesion. Adrenals/Urinary Tract: No right adrenal nodule or mass. Soft tissue nodularity in the expected location of the left adrenal gland may reflect nodular thickening, small lymph nodes, or vascular anatomy. This is not well evaluated given lack of intravenous contrast material. Both kidneys are heterogeneous in appearance. Probable punctate stones bilaterally.  No hydronephrosis. No hydroureter. The urinary bladder appears normal for the degree of distention. Stomach/Bowel: Stomach is distended with fluid. Duodenum is normally positioned as is the ligament of Treitz. No small bowel wall thickening. No small bowel dilatation. The terminal ileum is normal. The appendix is not well visualized, but there is no edema or inflammation in the region of the cecal tip to suggest appendicitis. No gross colonic mass. No colonic wall thickening. Vascular/Lymphatic: There is advanced atherosclerotic calcification of the abdominal aorta without aneurysm. Possible borderline lymphadenopathy in the left para-aortic space. No pelvic sidewall lymphadenopathy.  Reproductive: The prostate gland and seminal vesicles are unremarkable. Other: Small volume confluent edema is seen in the right upper quadrant of the abdomen adjacent to the gallbladder. Musculoskeletal: No worrisome lytic or sclerotic osseous abnormality. IMPRESSION: 1. Gallbladder lumen is filled with high density material, likely vicarious excretion of intravenous contrast administered for chest CTA 11/15/2024. Gallbladder wall appears circumferentially thickened with small volume confluent edema in the right upper quadrant of the abdomen adjacent to the gallbladder. Imaging features raise the question of acute cholecystitis. Right upper quadrant ultrasound may prove helpful. 2. Intrahepatic biliary duct dilatation. Common bile duct not well demonstrated on this noncontrast exam. Correlation with liver function test recommended. 3. Soft tissue nodularity in the expected location of the left adrenal gland may reflect nodular thickening, small lymph nodes, or vascular anatomy. This is not well evaluated given lack of intravenous contrast material. 4. Possible borderline lymphadenopathy in the left para-aortic space. 5.  Aortic Atherosclerosis (ICD10-I70.0). Electronically Signed   By: Camellia Candle M.D.   On: 11/19/2024 10:44   ECHOCARDIOGRAM COMPLETE Result Date: 11/16/2024    ECHOCARDIOGRAM REPORT   Patient Name:   Colin Keller Date of Exam: 11/16/2024 Medical Rec #:  [id number]     Height:       70.0 in Accession #:    [id number]    Weight:       154.0 lb Date of Birth:  03-16-1956     BSA:          1.868 m Patient Age:    68 years      BP:           140/67 mmHg Patient Gender: M             HR:           93 bpm. Exam Location:  Inpatient Procedure: 2D Echo, 3D Echo, Color Doppler, Cardiac Doppler and Strain Analysis            (Both Spectral and Color Flow Doppler were utilized during            procedure). Indications:    Pulmonary Embolus  History:        Patient has prior history of Echocardiogram  examinations, most                 recent 10/08/2018. CHF; Risk Factors:Current Smoker,                 Hypertension, Diabetes and Dyslipidemia.  Sonographer:    Logan Shove RDCS Referring Phys: [id number] NILDA HERO GHERGHE IMPRESSIONS  1. Left ventricular ejection fraction, by estimation, is 45 to 50%. Left ventricular ejection fraction by 3D volume is 45 %. The left ventricle has mildly decreased function. The left ventricle demonstrates global hypokinesis. Left ventricular diastolic  parameters are consistent with Grade III diastolic dysfunction (restrictive). Elevated left atrial pressure. The average left ventricular global longitudinal strain  is -15.3 %. The global longitudinal strain is abnormal.  2. Right ventricular systolic function is normal. The right ventricular size is moderately enlarged. Tricuspid regurgitation signal is inadequate for assessing PA pressure.  3. Left atrial size was severely dilated.  4. The mitral valve is normal in structure. Trivial mitral valve regurgitation. No evidence of mitral stenosis.  5. The aortic valve is normal in structure. Aortic valve regurgitation is not visualized. No aortic stenosis is present.  6. The inferior vena cava is dilated in size with >50% respiratory variability, suggesting right atrial pressure of 8 mmHg.  7. There is a long serpiginous density next to the oriface of the IVC into the RA. Review of this area on prior CTA shows no evidcene of thrombus. This likely represents a prominent eustacion valve. FINDINGS  Left Ventricle: Left ventricular ejection fraction, by estimation, is 45 to 50%. Left ventricular ejection fraction by 3D volume is 45 %. The left ventricle has mildly decreased function. The left ventricle demonstrates global hypokinesis. The average left ventricular global longitudinal strain is -15.3 %. Strain was performed and the global longitudinal strain is abnormal. The left ventricular internal cavity size was normal in size. There is no left  ventricular hypertrophy. Left ventricular diastolic parameters are consistent with Grade III diastolic dysfunction (restrictive). Elevated left atrial pressure. Right Ventricle: The right ventricular size is moderately enlarged. No increase in right ventricular wall thickness. Right ventricular systolic function is normal. Tricuspid regurgitation signal is inadequate for assessing PA pressure. Left Atrium: Left atrial size was severely dilated. Right Atrium: There is a long serpiginous density next to the oriface of the IVC into the RA. Review of this area on prior CTA shows no evidcene of thrombus. This likely represents a prominent eustacion valve. Right atrial size was normal in size. Pericardium: There is no evidence of pericardial effusion. Mitral Valve: The mitral valve is normal in structure. Mild to moderate mitral annular calcification. Trivial mitral valve regurgitation. No evidence of mitral valve stenosis. Tricuspid Valve: The tricuspid valve is normal in structure. Tricuspid valve regurgitation is not demonstrated. No evidence of tricuspid stenosis. Aortic Valve: The aortic valve is normal in structure. Aortic valve regurgitation is not visualized. No aortic stenosis is present. Aortic valve peak gradient measures 3.1 mmHg. Pulmonic Valve: The pulmonic valve was normal in structure. Pulmonic valve regurgitation is not visualized. No evidence of pulmonic stenosis. Aorta: The aortic root is normal in size and structure. Venous: The inferior vena cava is dilated in size with greater than 50% respiratory variability, suggesting right atrial pressure of 8 mmHg. IAS/Shunts: No atrial level shunt detected by color flow Doppler. Additional Comments: 3D was performed not requiring image post processing on an independent workstation and was abnormal.  LEFT VENTRICLE PLAX 2D LVIDd:         5.00 cm         Diastology LVIDs:         3.80 cm         LV e' medial:    5.44 cm/s LV PW:         1.00 cm         LV E/e'  medial:  20.0 LV IVS:        1.10 cm         LV e' lateral:   9.03 cm/s LVOT diam:     2.00 cm         LV E/e' lateral: 12.1 LVOT Area:     3.14  cm LV IVRT:       137 msec        2D Longitudinal                                Strain                                2D Strain GLS   -15.0 %                                (A4C):                                2D Strain GLS   -15.7 %                                (A3C):                                2D Strain GLS   -15.3 %                                (A2C):                                2D Strain GLS   -15.3 %                                Avg:                                 3D Volume EF                                LV 3D EF:    Left                                             ventricul                                             ar                                             ejection                                             fraction  by 3D                                             volume is                                             45 %.                                 3D Volume EF:                                3D EF:        45 %                                LV EDV:       162 ml                                LV ESV:       89 ml                                LV SV:        74 ml RIGHT VENTRICLE            IVC RV Basal diam:  4.50 cm    IVC diam: 2.30 cm RV Mid diam:    3.50 cm RV S prime:     8.59 cm/s  PULMONARY VEINS TAPSE (M-mode): 1.1 cm     Diastolic Velocity: 44.20 cm/s                            S/D Velocity:       0.70                            Systolic Velocity:  31.50 cm/s LEFT ATRIUM              Index         RIGHT ATRIUM           Index LA diam:        5.20 cm  2.78 cm/m    RA Area:     20.30 cm LA Vol (A2C):   193.0 ml 103.33 ml/m  RA Volume:   57.10 ml  30.57 ml/m LA Vol (A4C):   98.8 ml  52.89 ml/m LA Biplane Vol: 143.0 ml 76.56 ml/m  AORTIC VALVE AV Area (Vmax): 2.58 cm AV Vmax:         88.50 cm/s AV Peak Grad:   3.1 mmHg LVOT Vmax:      72.60 cm/s  AORTA Ao Root diam: 3.20 cm Ao Asc diam:  3.20 cm MITRAL VALVE MV Area (PHT): 4.31 cm     SHUNTS MV Decel Time: 176 msec     Systemic Diam: 2.00 cm MV E velocity: 109.00 cm/s MV  A velocity: 30.50 cm/s MV E/A ratio:  3.57 Wilbert Bihari MD Electronically signed by Wilbert Bihari MD Signature Date/Time: 11/16/2024/3:52:00 PM    Final    CT Angio Chest PE W and/or Wo Contrast Result Date: 11/15/2024 EXAM: CTA of the Chest with contrast for PE 11/15/2024 07:49:11 PM TECHNIQUE: CTA of the chest was performed after the administration of 60 mL of iohexol  (OMNIPAQUE ) 350 MG/ML injection. Multiplanar reformatted images are provided for review. MIP images are provided for review. Automated exposure control, iterative reconstruction, and/or weight based adjustment of the mA/kV was utilized to reduce the radiation dose to as low as reasonably achievable. COMPARISON: None available. CLINICAL HISTORY: sob w/ + d dimer FINDINGS: PULMONARY ARTERIES: Pulmonary arteries are adequately opacified for evaluation. A faint nonocclusive filling defect is present in the posterior basilar segmental artery of the left lower lobe. No other definite emboli are present. Main pulmonary artery is normal in caliber. MEDIASTINUM: The heart and pericardium demonstrate no acute abnormality. Dense atherosclerotic calcifications are present in the coronary arteries. Atherosclerotic changes are present in the aortic arch and great vessel origins. 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 There is no acute abnormality of the thoracic aorta. LYMPH NODES: No mediastinal, hilar or axillary lymphadenopathy. LUNGS AND PLEURA: A moderate left pleural effusion is present. A small right pleural effusion is present. Mild dependent atelectasis is present bilaterally. A 5 mm nodule in the left upper lobe is best visualized on image 49 of series 6. Punctate nodules are  present in the left lower lobe. An 8 mm nodule is present within the superior segment of the right lower lobe on image 65 of series 7. No pneumothorax. UPPER ABDOMEN: Limited images of the upper abdomen are unremarkable. SOFT TISSUES AND BONES: No acute bone or soft tissue abnormality. IMPRESSION: 1. Faint nonocclusive filling defect in the posterior basilar segmental artery of the left lower lobe, consistent with a small pulmonary embolism. No other definite emboli are present. 2. 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 3. Moderate left pleural effusion and small right pleural effusion. 4. Pulmonary nodules including a 5 mm left upper lobe nodule and an 8 mm right lower lobe nodule. For the 8 mm solid nodule, recommend non-contrast chest CT at 36 months; if high risk for malignancy, consider PET/CT or tissue sampling per Fleischner Society Guidelines. 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 CLINICAL HISTORY: SOB. FINDINGS: LUNGS AND PLEURA: No focal pulmonary opacity. No pleural effusion. No pneumothorax. HEART AND MEDIASTINUM: No acute abnormality of the cardiac and mediastinal silhouettes. BONES AND SOFT TISSUES: No acute osseous abnormality. IMPRESSION: 1. No acute cardiopulmonary process. Electronically signed by: [name] Seip MD 11/15/2024 06:13 PM EST RP Workstation: [id number]    Labs:  CBC: Recent Labs    11/22/24 1844 11/23/24 0208 11/24/24 0130 11/25/24 0414  WBC 16.7* 16.7* 18.8* 14.0*  HGB 6.9* 7.5* 8.3* 7.9*  HCT 20.7* 21.6* 23.4* 23.2*  PLT 217 264 276 247    COAGS: Recent Labs    11/21/24 1409 11/22/24 0344 11/23/24 0208 11/25/24 0414  APTT 130* 87* 38* 66*    BMP: Recent Labs    11/22/24 1735 11/23/24 0208 11/24/24 0130  11/25/24 0414  NA 136 135 132* 132*  K 3.7 3.7 3.5  4.1  CL 101 100 94* 101  CO2 22 23 23  17*  GLUCOSE 237* 194* 214* 296*  BUN 54* 61* 74* 77*  CALCIUM  7.9* 8.2* 8.0* 8.0*  CREATININE 3.43* 3.73* 4.09* 3.98*  GFRNONAA 18* 17* 15* 15*    LIVER FUNCTION TESTS: Recent Labs    11/22/24 1221 11/23/24 0208 11/24/24 0822 11/25/24 0414  BILITOT 3.9* 2.8* 3.2* 2.7*  AST 154* 93* 347* 168*  ALT 169* 143* 211* 199*  ALKPHOS 582* 450* 1,158* 948*  PROT 5.9* 5.7* 6.0* 5.1*  ALBUMIN  2.0* 2.0* 1.9* 1.6*    TUMOR MARKERS: No results for input(s): AFPTM, CEA, CA199, CHROMGRNA in the last 8760 hours.  Assessment and Plan: AKI on CKD stage III, nephrotic syndrome.   Patient noted with AKI during admission, presumed to be prerenal injury versus medication related.  Zosyn  was discontinued.  Possible concern for peri-infectious glomerulonephritis.  Patient is nonoliguric.  Renal ultrasound is pending at this time.  Current nephrotic symptoms suspected to be driven by uncontrolled diabetes.  However, nephrology requesting nontarget renal biopsy for diagnosis.  He is approved for same, with IR.  Patient did receive 81 mg aspirin  this morning.  This will require no less than 5-day washout period care team advised, and amenable to aspirin  hold, effective today.  Patient will present for tentatively scheduled non-targeted renal biopsy on 12/22, following aspirin  hold.  Patient will be made n.p.o. at midnight on 12/22 in anticipation of moderate sedation. All current labs and medications are within acceptable parameters.  Heparin  drip will require discontinued 4 hours prior to renal biopsy, and may be restarted 2 hours post procedure.  IR will coordinate with care team timing to discontinue heparin  drip on day of procedure. Allergies reviewed: Amlodipine ; empagliflozin ; amitriptyline ; lisinopril ; nortriptyline ; statins.  Risks and benefits of renal biopsy was discussed with the patient and/or  patient's family including, but not limited to bleeding, infection, damage to adjacent structures or low yield requiring additional tests.  All of the questions were answered and there is agreement to proceed.  Consent signed and in chart.   2.  Acute cholecystitis, status post percutaneous cholecystostomy tube placement.  Drain Location: RUQ Size: Fr size: 10 Fr Date of placement: 11/24/2024  Currently to: Drain collection device: suction bulb 24 hour output:  Output by Drain (mL) 11/23/24 0701 - 11/23/24 1900 11/23/24 1901 - 11/24/24 0700 11/24/24 0701 - 11/24/24 1900 11/24/24 1901 - 11/25/24 0700 11/25/24 0701 - 11/25/24 1625  Biliary Tube Cholecystostomy 10 Fr. RUQ   40 260 270    Interval imaging/drain manipulation:  None.  Current examination: Patient currently tender within expected limits from recent placement. Flushes/aspirates easily.  Insertion site unremarkable. Suture and stat lock in place. Dressed appropriately.   Plan: Continue TID flushes with 5 cc NS. Record output Q shift. Dressing changes QD or PRN if soiled.  Call IR APP or on call IR MD if difficulty flushing or sudden change in drain output.  Repeat imaging/possible drain injection once output < 10 mL/QD (excluding flush material). Consideration for drain removal if output is < 10 mL/QD (excluding flush material), pending discussion with the providing surgical service.  Discharge planning: Please contact IR APP or on call IR MD prior to patient d/c to ensure appropriate follow up plans are in place. Patient will need to flush drain QD with 5 cc NS, record output QD, dressing changes every 2-3 days or earlier if soiled.   IR will continue to follow - please call with  questions or concerns.     Thank you for allowing our service to participate in Colin Keller 's care.  Electronically Signed: Carlin DELENA Griffon, PA-C   11/25/2024, 3:54 PM      I spent a total of 40 Minutes in face to face in  clinical consultation, greater than 50% of which was counseling/coordinating care for proteinuria, with consideration for non-target renal biopsy.

## 2024-11-25 NOTE — Anesthesia Postprocedure Evaluation (Signed)
 Anesthesia Post Note  Patient: Colin Keller  Procedure(s) Performed: ERCP, WITH INTERVENTION IF INDICATED SPHINCTEROTOMY, BILIARY INSERTION, STENT, BILE DUCT     Patient location during evaluation: PACU Anesthesia Type: General Level of consciousness: awake, confused and combative Pain management: pain level controlled Vital Signs Assessment: post-procedure vital signs reviewed and stable Respiratory status: spontaneous breathing, nonlabored ventilation, respiratory function stable and patient connected to nasal cannula oxygen Cardiovascular status: blood pressure returned to baseline and stable Postop Assessment: no apparent nausea or vomiting Anesthetic complications: no Comments: Initially minimally responsive but gradually improved during PACU stay. Remains bradycardic (sinus) but with stable blood pressure. Now somewhat more confused than preop, possibly due to atropine  administered intraoperatively for profound bradycardia, but awake and alert. Intraoperative and postoperative course reviewed with primary team.     No notable events documented.  Last Vitals:  Vitals:   11/24/24 2042 11/25/24 0511  BP: (!) 182/75 (!) 154/70  Pulse: 85 93  Resp: 18   Temp: 36.9 C 36.9 C  SpO2: 99% 96%    Last Pain:  Vitals:   11/25/24 0511  TempSrc: Oral  PainSc:                  [name] MARLA Cornea

## 2024-11-25 NOTE — Inpatient Diabetes Management (Signed)
 Inpatient Diabetes Program Recommendations  AACE/ADA: New Consensus Statement on Inpatient Glycemic Control (2015)  Target Ranges:  Prepandial:   less than 140 mg/dL      Peak postprandial:   less than 180 mg/dL (1-2 hours)      Critically ill patients:  140 - 180 mg/dL   Lab Results  Component Value Date   GLUCAP 257 (H) 11/25/2024   HGBA1C 9.6 (H) 11/15/2024    Review of Glycemic Control  Latest Reference Range & Units 11/24/24 11:29 11/24/24 15:37 11/24/24 20:39 11/24/24 23:55 11/25/24 05:10 11/25/24 08:17  Glucose-Capillary 70 - 99 mg/dL 707 (H) 742 (H) 849 (H) 186 (H) 312 (H) 257 (H)   Diabetes history: DM 2 Outpatient Diabetes medications:  Basaglar  10 units QAM Humalog  3-5 units TID  Freestyle Libre 3 Plus Current orders for Inpatient glycemic control:  Novolog  0-15 units q 4 hours Lantus  10 units daily  Inpatient Diabetes Program Recommendations:    Note diet started today.  Consider increasing Lantus  to 12 units daily.  Also recommend reducing Novolog  to sensitive (0-9 unis) tid with meals and HS plus Novolog  meal coverage 2-3 units tid with  meals (hold if patient eats less than 50% or NPO).   Thanks,  Randall Bullocks, RN, BC-ADM Inpatient Diabetes Coordinator Pager [phone]  (8a-5p)

## 2024-11-25 NOTE — Assessment & Plan Note (Addendum)
 BG elevated > 200 throughout last 24 hours. 37u ssi yesterday.  -Change to 15u LAI daily - Continue SSI moderate  - Hold home metformin 

## 2024-11-25 NOTE — Progress Notes (Addendum)
 Daily Progress Note Intern Pager: [phone]  Patient name: Colin Keller Medical record number: [id number] Date of birth: 1956-10-17 Age: 68 y.o. Gender: male  Primary Care Provider: McDiarmid, Krystal BIRCH, MD Consultants: Gen surg, GI, IR, Nephro Code Status: Full  Pt Overview and Major Events to Date:  12-9 admitted 12-12 ERCP, transfer to ICU for persistent bradycardia 12-14 return to floor from ICU, IR and Nephro consulted, IR placed cholecystostomy tube  Medical Decision Making:  68 yo m w/ hx of recent PE now s/p ERCP for CBD dilatation and cholecystostomy tube by IR on 12/14. VSS, LFTs and WBC downtrending. EUS scheduled for 12/16 to investigate pancreatic cysts that could be contributing to his CBD dilatation. Patient has had persistent AKI while on zosyn . Nephrology  recommending renal biopsy after aspirin  wash out, and will start w/ renal US .  Assessment & Plan Abdominal pain Biliary stricture (HCC) Biliary sepsis (HCC) Afebrile w/ high BP but otherwise stable vitals. S/p percutaneous cholecystomy tube w/ IR. LFTs, leukocytes downtrending.  - Pain regimen: Changed to oxycodone  5mg  q6 prn - Bowel regimen w/ miralax  bid and senna daily - Zofran  4mg  q6 prn for nausea - Maalox q6 prn, caution w/ aki, has not received since 12/13 - Change antibiotics to Ceftriaxone   and Flagyl  from Zosyn  in the setting of continued acute kidney injury  -GI consulted, appreciate their recommendations  -Endoscopic US  for investigation of pancreatic cysts on 12/16.  -NPO @ midnight  and holding heparin  at 0200.  - General surgery following  -AM CBC, CMP AKI (acute kidney injury) AKI persistent but stable. Patient w/ foley catheter in to monitor UOP, appropriate to this point.  -Nephro consulted, appreciate their recommendations Renal US  pending Plan for renal biopsy after aspirin  wash out x 5 days (starting 12/16) Added foley for monitoring UOP Lab workup from Nephro includes the following:  C3, C4, Anti DNA antibody, ANCA, hepatitis panel, ANA w/ reflex, kappa/lambda light chains, protein electrophoresis, microalbumin/Cr ratio, protein/Cr ratio - Change maintenance IVF to 85 mL/h LR - holding home lasix , hydrochlorothiazide  - AM CMP Acute pulmonary embolism (HCC) Recently discharged on eliquis . Normal wob on room air. - Continue heparin  until 0200 for EUS tomorrow.  - Hold eliquis  T2DM (type 2 diabetes mellitus) (HCC) BG elevated > 200 throughout last 24 hours. 37u ssi yesterday.  -Change to 15u LAI daily - Continue SSI moderate  - Hold home metformin  Delusions (HCC) (Resolved: 11/25/2024) No delusions reported overnight. Will continue to monitor and caution with pain medications.  -Neuro exam non focal -Normal respiration, no concern for hypercarbia -Scaled back pain regimen as described above -Also possibly 2/2 uremia in the setting of persistent AKI, see AKI Chronic health problem HTN/CAD/CHF: hold home Coreg , diltiazem . Restart home hydralazine  50mg  q8h BPH: Continue home tamsulosin  GERD: Continue home Protonix    FEN/GI: Heart healthy carb mod, NPO @ MN PPx: heparin  drip, d/c @ 0200 Dispo:pending clinical improvement   Subjective:  Pt reports no delusions overnight. He endorses continued abdominal pain, but feel slightly improved vs prior. Tolerated his procedure all things considered. He endorses some constipation the last couple of days.   Objective: Temp:  [97.6 F (36.4 C)-98.5 F (36.9 C)] 98.4 F (36.9 C) (12/15 0511) Pulse Rate:  [78-93] 93 (12/15 0511) Resp:  [15-20] 18 (12/14 2042) BP: (154-182)/(70-97) 154/70 (12/15 0511) SpO2:  [95 %-99 %] 96 % (12/15 0511) Physical Exam: General: Awake but tired appearing  Cardio: RRR. Normal S1, S2. No murmur, rub, gallop.  2+ radial and dorsalis pedis pulses b/l w/ good capillary refill.  Resp: CTA bilaterally. No wheezes, rales, or rhonchi. Normal work of breathing on room air Abdomen: soft, non-distended.  Mild guarding with RUQ deep palpation. Cholecystostomy tube secured in place with drainage through tube.  Neuro: AOx4. No focal deficits.    Laboratory: Most recent CBC Lab Results  Component Value Date   WBC 14.0 (H) 11/25/2024   HGB 7.9 (L) 11/25/2024   HCT 23.2 (L) 11/25/2024   MCV 91.0 11/25/2024   PLT 247 11/25/2024   Most recent BMP    Latest Ref Rng & Units 11/25/2024    4:14 AM  BMP  Glucose 70 - 99 mg/dL 703   BUN 8 - 23 mg/dL 77   Creatinine 9.38 - 1.24 mg/dL 6.01   Sodium 864 - 854 mmol/L 132   Potassium 3.5 - 5.1 mmol/L 4.1   Chloride 98 - 111 mmol/L 101   CO2 22 - 32 mmol/L 17   Calcium  8.9 - 10.3 mg/dL 8.0    AST 831 ALT 800 Alk Phos 948  Pending: C3, C4, Anti DNA antibody, ANCA, hepatitis panel, ANA w/ reflex, kappa/lambda light chains, protein electrophoresis, microalbumin/Cr ratio, protein/Cr ratio  Imaging/Diagnostic Tests:  Renal US  pending   Manon Jester, DO 11/25/2024, 8:19 AM  PGY-1, [facility] FPTS Intern pager: [phone], text pages welcome Secure chat group [facility] Teaching Service

## 2024-11-25 NOTE — Progress Notes (Signed)
°  3 Days Post-Op   Chief Complaint/Subjective: No complaints overnight, family confused about procedure timings  Objective: Vital signs in last 24 hours: Temp:  [97.6 F (36.4 C)-98.5 F (36.9 C)] 98.4 F (36.9 C) (12/15 0819) Pulse Rate:  [78-93] 89 (12/15 0819) Resp:  [15-20] 19 (12/15 0819) BP: (154-182)/(70-97) 155/70 (12/15 0819) SpO2:  [95 %-99 %] 97 % (12/15 0819) Last BM Date : 11/19/24 Intake/Output from previous day: 12/14 0701 - 12/15 0700 In: 698.8 [I.V.:493.8; IV Piggyback:200] Out: 1215 [Urine:915; Drains:300]  PE: Gen: NAD Resp: nonlabored Card: RRR Abd: soft, nontender, drain with bloody/bilious output  Lab Results:  Recent Labs    11/24/24 0130 11/25/24 0414  WBC 18.8* 14.0*  HGB 8.3* 7.9*  HCT 23.4* 23.2*  PLT 276 247   Recent Labs    11/24/24 0130 11/25/24 0414  NA 132* 132*  K 3.5 4.1  CL 94* 101  CO2 23 17*  GLUCOSE 214* 296*  BUN 74* 77*  CREATININE 4.09* 3.98*  CALCIUM  8.0* 8.0*   No results for input(s): LABPROT, INR in the last 72 hours.    Component Value Date/Time   NA 132 (L) 11/25/2024 0414   NA 137 10/30/2024 1126   K 4.1 11/25/2024 0414   CL 101 11/25/2024 0414   CO2 17 (L) 11/25/2024 0414   GLUCOSE 296 (H) 11/25/2024 0414   BUN 77 (H) 11/25/2024 0414   BUN 38 (H) 10/30/2024 1126   CREATININE 3.98 (H) 11/25/2024 0414   CREATININE 1.28 11/24/2023 0000   CALCIUM  8.0 (L) 11/25/2024 0414   PROT 5.1 (L) 11/25/2024 0414   PROT 6.2 06/11/2024 1046   ALBUMIN  1.6 (L) 11/25/2024 0414   ALBUMIN  3.6 (L) 06/11/2024 1046   AST 168 (H) 11/25/2024 0414   ALT 199 (H) 11/25/2024 0414   ALKPHOS 948 (H) 11/25/2024 0414   BILITOT 2.7 (H) 11/25/2024 0414   BILITOT <0.2 06/11/2024 1046   GFRNONAA 15 (L) 11/25/2024 0414   GFRNONAA 62 08/12/2015 0943   GFRAA 55 (L) 12/10/2020 1643   GFRAA 71 08/12/2015 0943    Assessment/Plan  s/p Procedures: ERCP, WITH INTERVENTION IF INDICATED SPHINCTEROTOMY, BILIARY INSERTION, STENT, BILE  DUCT 11/22/2024  Biliary obstruction concerning for stricture, ERCP stent placed, unable to do brushings, cholecystostomy tube placed 12/14 for cholecystitis concern   FEN - heart healthy diet VTE - heparin  drip for PE ID - ceftriaxone  12/14--> Disposition - inpatient   LOS: 6 days   I reviewed last 24 h vitals and pain scores, last 48 h intake and output, last 24 h labs and trends, and last 24 h imaging results.  This care required straight-forward level of medical decision making.   Herlene Righter [facility] 11/25/2024, 8:57 AM Please see Amion for pager number during day hours 7:00am-4:30pm or 7:00am -11:30am on weekends

## 2024-11-25 NOTE — Evaluation (Signed)
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 Post -op ERCP 12/12.     Clinical Impressions Pt admitted with the above concerns. Pt currently with functional limitations due to the deficits listed below (see OT Problem List). Prior to admit, pt was living at home with his wife, independent with all ADL tasks. Pt will benefit from acute skilled OT to increase their safety and independence with ADL and functional mobility for ADL to facilitate discharge. At this time, pt may benefit from follow up Home health OT to further increase strength, endurance, and activity tolerance to all him to complete ADL tasks with decreased difficulty.      If plan is discharge home, recommend the following:   A little help with walking and/or transfers;A little help with bathing/dressing/bathroom;Help with stairs or ramp for entrance;Assistance with cooking/housework;Assist for transportation     Functional Status Assessment   Patient has had a recent decline in their functional status and demonstrates the ability to make significant improvements in function in a reasonable and predictable amount of time.     Equipment Recommendations   None recommended by OT      Precautions/Restrictions   Precautions Precautions: None Recall of Precautions/Restrictions: Intact Restrictions Weight Bearing Restrictions Per Provider Order: No     Mobility Bed Mobility Overal bed mobility: Modified Independent             General bed mobility comments: Increased time to compete although did so without physical assist.    Transfers Overall transfer  level: Needs assistance Equipment used: None Transfers: Sit to/from Stand Sit to Stand: Contact guard assist           General transfer comment: VC for hand placement during RW management prior to sit to stand.      Balance Overall balance assessment: Mild deficits observed, not formally tested        ADL either performed or assessed with clinical judgement   ADL      Upper Body Bathing: Supervision/ safety;Sitting   Lower Body Bathing: Supervison/ safety;Sitting/lateral leans   Upper Body Dressing : Supervision/safety;Sitting   Lower Body Dressing: Supervision/safety;Sitting/lateral leans   Toilet Transfer: Contact guard assist;Rolling walker (2 wheels);Stand-pivot   Toileting- Clothing Manipulation and Hygiene: Supervision/safety;Sitting/lateral lean               Vision Baseline Vision/History: 0 No visual deficits Ability to See in Adequate Light: 0 Adequate Patient Visual Report: No change from baseline Vision Assessment?: No apparent visual deficits     Perception Perception: Not tested       Praxis Praxis: Not tested       Pertinent Vitals/Pain Pain Assessment Pain Assessment: Faces Faces Pain Scale: Hurts little more Pain Location: abdomen Pain Descriptors / Indicators: Sore Pain Intervention(s): Monitored during session, Limited activity within patient's tolerance, Premedicated before session     Extremity/Trunk Assessment Upper Extremity Assessment Upper Extremity Assessment: Generalized weakness;Right hand dominant   Lower Extremity Assessment Lower Extremity Assessment: Defer to PT evaluation   Cervical / Trunk Assessment Cervical / Trunk Assessment: Kyphotic   Communication Communication Communication: No apparent difficulties   Cognition Arousal: Alert Behavior During Therapy: [facility] [place] for  tasks assessed/performed, Flat affect    Following commands: Intact       Cueing  General Comments   Cueing Techniques: Verbal  cues  VSS on RA           Home Living Family/patient expects to be discharged to:: Private residence Living Arrangements: Spouse/significant other Available Help at Discharge: Family;Available 24 hours/day Type of Home: Mobile home Home Access: Stairs to enter Entrance Stairs-Number of Steps: 1   Home Layout: One level     Bathroom Shower/Tub: Producer, Television/film/video: Standard     Home Equipment: Shower seat   Additional Comments: shower seat is wife's      Prior Functioning/Environment Prior Level of Function : Independent/Modified Independent;Driving       OT Problem List: Decreased strength;Pain;Decreased activity tolerance;Impaired balance (sitting and/or standing)   OT Treatment/Interventions: Self-care/ADL training;Therapeutic exercise;Therapeutic activities;Energy conservation;DME and/or AE instruction;Patient/family education;Balance training      OT Goals(Current goals can be found in the care plan section)   Acute Rehab OT Goals Patient Stated Goal: to get some sleep/rest OT Goal Formulation: With patient Time For Goal Achievement: 12/03/2024 Potential to Achieve Goals: Good   OT Frequency:  Min 2X/week       AM-PAC OT 6 Clicks Daily Activity     Outcome Measure Help from another person eating meals?: None Help from another person taking care of personal grooming?: None Help from another person toileting, which includes using toliet, bedpan, or urinal?: A Lot Help from another person bathing (including washing, rinsing, drying)?: A Little Help from another person to put on and taking off regular upper body clothing?: A Little Help from another person to put on and taking off regular lower body clothing?: A Little 6 Click Score: 19   End of Session Equipment Utilized During Treatment: Rolling walker (2 wheels)  Activity Tolerance: Patient limited by fatigue Patient left: in bed;with call bell/phone within reach;with bed alarm set;with  family/visitor present  OT Visit Diagnosis: Muscle weakness (generalized) (M62.81)                Time: 8887-8869 OT Time Calculation (min): 18 min Charges:  OT General Charges $OT Visit: 1 Visit OT Evaluation $OT Eval Low Complexity: 1 Low  [employer], OTR/L,CBIS  Supplemental OT - [facility] and [employer] Secure Chat Preferred    Yesmin Mutch, Leita BIRCH 11/25/2024, 2:03 PM

## 2024-11-25 NOTE — Assessment & Plan Note (Addendum)
 Recently discharged on eliquis . Normal wob on room air. - Continue heparin  until 0200 for EUS tomorrow.  - Hold eliquis 

## 2024-11-25 NOTE — Assessment & Plan Note (Addendum)
 No delusions reported overnight. Will continue to monitor and caution with pain medications.  -Neuro exam non focal -Normal respiration, no concern for hypercarbia -Scaled back pain regimen as described above -Also possibly 2/2 uremia in the setting of persistent AKI, see AKI

## 2024-11-25 NOTE — Progress Notes (Signed)
  [facility] Progress Note    Assessment/ Plan:   AKI on CKD3B Nephrotic Syndrome -baseline Cr ~1.9-2.2, followed by Dr. Rayburn OP. Has baseline nephrotic range proteinuria -AKI presumed to be pre-renal injury and possibly zosyn  related AKI. Zosyn  stopped. 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 Nonoliguric -renal ultrasound pending. Urine sediment relatively bland -will need to get in our OP EMR to see what work up he has had in the past for his nephrotic syndrome. Suspecting this is driven by uncontrolled DM but I believe we should move forward with a renal biopsy while he's here. He is on aspirin , will d/w primary service in regards to holding this (will need 5 day washout). Discussed with wife at the bedside and she is in agreement. In the interim, will proceed with serological work up (hepatitis panel, ANA, ANCA, SPEP/FLC/UPEP, PLA2R) -no indications to start renal replacement therapy -Avoid nephrotoxic medications including NSAIDs and iodinated intravenous contrast exposure unless the latter is absolutely indicated.  Preferred narcotic agents for pain control are hydromorphone , fentanyl , and methadone. Morphine  should not be used. Avoid Baclofen  and avoid oral sodium phosphate and magnesium  citrate based laxatives / bowel preps. Continue strict Input and Output monitoring. Will monitor the patient closely with you and intervene or adjust therapy as indicated by changes in clinical status/labs   Biliary obstruction Elevated LFTs-improving -s/p ERCP with stent, s/p chole tube w/ IR -GI and gen surg following -zosyn  switched to rocephin  and flagyl   HTN -hold home lasix , can titrate hydralazine  if needed  Uncontrolled DM2 with hyperglycemia -mgmt per primary service  PE -on heparin  gtt, per primary service -suspecting this is secondary to nephrotic syndrome?  Discussed with wife at the bedside. Discussed  with primary service.  Subjective:   Patient seen and examined bedside. UOP ~0.9L+1 unmeasured void. Has not been eating/drinking for a while now. No complaints   Objective:   BP (!) 155/70 (BP Location: Left Arm)   Pulse 89   Temp 98.4 F (36.9 C)   Resp 19   Ht 5' 10 (1.778 m)   Wt 67.5 kg   SpO2 97%   BMI 21.35 kg/m   Intake/Output Summary (Last 24 hours) at 11/25/2024 1026 Last data filed at 11/25/2024 9344 Gross per 24 hour  Intake 648.77 ml  Output 1215 ml  Net -566.23 ml   Weight change:   Physical Exam: Gen: ill & tired appearing, NAD, laying flat in bed CVS: RRR Resp: CTA BL Abd: soft, nt/nd, +chole drain Ext: no edema Neuro: awake, following commands  Imaging: IR Perc Cholecystostomy Result Date: 11/25/2024 INDICATION: 68 year old male with concern for acute cholecystitis. EXAM: Fluoroscopic and ultrasound-guided percutaneous cholecystostomy tube placement MEDICATIONS: The patient was currently receiving intravenous antibiotics as an inpatient, no additional antibiotics were administered. ANESTHESIA/SEDATION: Moderate (conscious) sedation was employed during this procedure. A total of Versed  2 mg and Fentanyl  100 mcg was administered intravenously. Moderate Sedation Time: 6 minutes. The patient's level of consciousness and vital signs were monitored continuously by radiology nursing throughout the procedure under my direct supervision. FLUOROSCOPY TIME:  One mGy reference air kerma COMPLICATIONS: None immediate. PROCEDURE: Informed written consent was obtained from the patient after a thorough discussion of the procedural risks, benefits and alternatives. All questions were addressed. Maximal Sterile Barrier Technique was utilized including caps, mask, sterile gowns, sterile gloves, sterile drape, hand hygiene and skin antiseptic. A timeout was performed prior to the initiation of the procedure. The patient  was placed supine on the angiographic table. The patient's  right upper quadrant was then prepped and draped in normal sterile fashion with maximum sterile barrier. Ultrasound demonstrates a distended gallbladder. Subdermal Local anesthesia was provided at the planned skin entry site. Under ultrasound guidance, deeper local anesthetic was provided through intercostal muscles and along the liver capsule. Ultrasound was used to puncture the gallbladder using a 18 gauge trocar needle via a subcostal, transabdominal approach. A 0.035 inch exchange wire was placed in the tract was dilated. A 10 French multipurpose drainage catheter was advanced into the gallbladder lumen. Approximately 100 cc of bilious aspirate was a vacuo aided. Contrast injection demonstrated nondistended gallbladder without visualization of the cystic duct. The drain was then secured in place using a 0-silk suture and a Stayfix device. A sterile dressing was applied. The tube was placed to bulb suction. A culture was sent to the lab for analysis. The patient tolerated procedure well without evidence of immediate complication was transferred back to the floor in stable condition. IMPRESSION: Successful placement of percutaneous, transabdominal 10 French cholecystostomy tube. Ester Sides, MD [facility] Electronically Signed   By: Ester Sides M.D.   On: 11/25/2024 07:27   ECHOCARDIOGRAM LIMITED Result Date: 11/23/2024    ECHOCARDIOGRAM LIMITED REPORT   Patient Name:   Colin Keller Date of Exam: 11/23/2024 Medical Rec #:  [id number]     Height:       70.0 in Accession #:    [id number]    Weight:       148.8 lb Date of Birth:  1956/05/06     BSA:          1.841 m Patient Age:    68 years      BP:           131/69 mmHg Patient Gender: M             HR:           59 bpm. Exam Location:  Inpatient Procedure: Limited Echo (Both Spectral and Color Flow Doppler were utilized            during procedure). Indications:    R94.31 Abnormal EKG  History:         Patient has prior history of Echocardiogram examinations, most                 recent 11/16/2024. CAD.  Sonographer:    Nathanel Devonshire Referring Phys: [id number] TINNIE FORBES FURTH IMPRESSIONS  1. Limited echo with limited images  2. Left ventricular ejection fraction, by estimation, is 50 to 55%. Left ventricular ejection fraction by 2D MOD biplane is 52.0 %. The left ventricle has low normal function. The left ventricle demonstrates global hypokinesis. There is mild left ventricular hypertrophy. Left ventricular diastolic parameters are consistent with Grade III diastolic dysfunction (restrictive). Elevated left ventricular end-diastolic pressure.  3. The mitral valve is degenerative. Trivial mitral valve regurgitation.  4. The inferior vena cava is normal in size with <50% respiratory variability, suggesting right atrial pressure of 8 mmHg. Comparison(s): Changes from prior study are noted. 11/16/2024: LVEF 45-50%, grade III DD. FINDINGS  Left Ventricle: Left ventricular ejection fraction, by estimation, is 50 to 55%. Left ventricular ejection fraction by 2D MOD biplane is 52.0 %. The left ventricle has low normal function. The left ventricle demonstrates global hypokinesis. There is mild left ventricular hypertrophy. Left ventricular diastolic parameters are consistent with Grade III diastolic dysfunction (restrictive). Elevated  left ventricular end-diastolic pressure. Mitral Valve: The mitral valve is degenerative in appearance. Mild to moderate mitral annular calcification. Trivial mitral valve regurgitation. Venous: The inferior vena cava is normal in size with less than 50% respiratory variability, suggesting right atrial pressure of 8 mmHg.  LV Volumes (MOD)               Biplane EF (MOD) LV vol d, MOD    131.0 ml      LV Biplane EF:   Left A2C:                                            ventricular LV vol d, MOD    145.0 ml                       ejection A4C:                                            fraction by LV  vol s, MOD    60.3 ml                        2D MOD A2C:                                            biplane is LV vol s, MOD    73.1 ml                        52.0 %. A4C: LV SV MOD A2C:   70.7 ml       Diastology LV SV MOD A4C:   145.0 ml      LV e' medial:   3.48 cm/s LV SV MOD BP:    73.1 ml       LV E/e' medial: 34.8  MITRAL VALVE MV Area (PHT): 4.63 cm MV Decel Time: 164 msec MV E velocity: 121.00 cm/s MV A velocity: 51.90 cm/s MV E/A ratio:  2.33 Vinie Maxcy MD Electronically signed by Vinie Maxcy MD Signature Date/Time: 11/23/2024/12:26:13 PM    Final     Labs: BMET Recent Labs  Lab 11/21/24 0424 11/22/24 0344 11/22/24 1221 11/22/24 1505 11/22/24 1735 11/23/24 0208 11/24/24 0130 11/25/24 0414  NA 134* 134* 133* 136 136 135 132* 132*  K 3.7 3.7 4.2 3.8 3.7 3.7 3.5 4.1  CL 101 99 97* 99 101 100 94* 101  CO2 23 25 22   --  22 23 23  17*  GLUCOSE 200* 154* 312* 299* 237* 194* 214* 296*  BUN 34* 41* 50* 48* 54* 61* 74* 77*  CREATININE 2.51* 2.95* 3.27* 3.30* 3.43* 3.73* 4.09* 3.98*  CALCIUM  8.0* 8.4* 8.1*  --  7.9* 8.2* 8.0* 8.0*   CBC Recent Labs  Lab 11/22/24 1844 11/23/24 0208 11/24/24 0130 11/25/24 0414  WBC 16.7* 16.7* 18.8* 14.0*  NEUTROABS  --   --   --  11.7*  HGB 6.9* 7.5* 8.3* 7.9*  HCT 20.7* 21.6* 23.4* 23.2*  MCV 95.8 90.8 89.7 91.0  PLT 217 264 276 247    Medications:     aspirin   EC  81 mg Oral Daily   Chlorhexidine  Gluconate Cloth  6 each Topical Daily   feeding supplement  1 Container Oral TID BM   hydrALAZINE   50 mg Oral Q8H   insulin  aspart  0-15 Units Subcutaneous Q4H   insulin  glargine  10 Units Subcutaneous Daily   isosorbide  mononitrate  60 mg Oral Daily   lidocaine   3 patch Transdermal Q24H   metroNIDAZOLE   500 mg Oral Q12H   nicotine   7 mg Transdermal Daily   pantoprazole   40 mg Oral Daily   polyethylene glycol  17 g Oral Daily   senna  1 tablet Oral Daily   sodium chloride  flush  5 mL Intracatheter Q8H   tamsulosin   0.4 mg Oral Daily       Ephriam Stank, MD [facility] 11/25/2024, 10:26 AM

## 2024-11-25 NOTE — Progress Notes (Signed)
 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 Post -op ERCP 12/12.    PT Comments  Pt progressing towards physical therapy goals. Was able to perform transfers and ambulation with up to min assist for balance support and safety. Did not use AD on eval but today requiring IV pole use and steadying assist. Will continue to follow and progress as able per POC.    If plan is discharge home, recommend the following: Assistance with cooking/housework;Assist for transportation;A little help with bathing/dressing/bathroom   Can travel by private vehicle        Equipment Recommendations  None recommended by PT    Recommendations for Other Services       Precautions / Restrictions Precautions Precautions: None Recall of Precautions/Restrictions: Intact Restrictions Weight Bearing Restrictions Per Provider Order: No     Mobility  Bed Mobility Overal bed mobility: Modified Independent             General bed mobility comments: Increased time to compete although did so without physical assist.    Transfers Overall transfer level: Needs assistance Equipment used: None Transfers: Sit to/from Stand Sit to Stand: Min assist           General transfer comment: Assist for balance support upon full stand. Posterior lean and bracing against bed.    Ambulation/Gait Ambulation/Gait assistance: Contact guard assist, Min assist Gait Distance (Feet): 200 Feet Assistive device: IV Pole Gait Pattern/deviations: Step-through pattern, Decreased stride length, Drifts right/left Gait velocity: decreased Gait velocity interpretation: 1.31 - 2.62  ft/sec, indicative of limited community ambulator   General Gait Details: Motivated for distance. Utilized IV pole today and was unsteady, requiring min assist at times to recover.   Stairs             Wheelchair Mobility     Tilt Bed    Modified Rankin (Stroke Patients Only)       Balance Overall balance assessment: Mild deficits observed, not formally tested                                          Communication Communication Communication: No apparent difficulties  Cognition Arousal: Alert Behavior During Therapy: WFL for tasks assessed/performed, Flat affect   PT - Cognitive impairments: No apparent impairments                         Following commands: Intact      Cueing Cueing Techniques: Verbal cues  Exercises General Exercises - Lower Extremity Long Arc Quad: 10 reps, AROM, Both, Seated    General Comments General comments (skin integrity, edema, etc.): VSS on RA      Pertinent Vitals/Pain Pain Assessment Pain Assessment: Faces Faces Pain Scale: Hurts a little bit Pain Location: abdomen Pain Descriptors / Indicators: Sore Pain Intervention(s): Limited activity within patient's tolerance, Monitored during session, Repositioned    Home Living Family/patient expects to be discharged to:: Private residence Living Arrangements: Spouse/significant other Available Help at Discharge: Family;Available 24 hours/day Type of Home: Mobile home Home Access: Stairs to  enter   Entrance Stairs-Number of Steps: 1   Home Layout: One level Home Equipment: Shower seat Additional Comments: shower seat is wife's    Prior Function            PT Goals (current goals can now be found in the care plan section) Acute Rehab PT Goals Patient Stated Goal: home PT Goal Formulation: With patient Time For Goal Achievement: 12/05/24 Potential to Achieve Goals: Good Progress towards PT goals: Progressing toward goals     Frequency    Min 2X/week      PT Plan      Co-evaluation              AM-PAC PT 6 Clicks Mobility   Outcome Measure  Help needed turning from your back to your side while in a flat bed without using bedrails?: None Help needed moving from lying on your back to sitting on the side of a flat bed without using bedrails?: None Help needed moving to and from a bed to a chair (including a wheelchair)?: A Little Help needed standing up from a chair using your arms (e.g., wheelchair or bedside chair)?: A Little Help needed to walk in hospital room?: A Little Help needed climbing 3-5 steps with a railing? : A Little 6 Click Score: 20    End of Session Equipment Utilized During Treatment: Gait belt Activity Tolerance: Patient tolerated treatment well Patient left: in bed;with call bell/phone within reach;with family/visitor present Nurse Communication: Mobility status PT Visit Diagnosis: Muscle weakness (generalized) (M62.81)     Time: 8458-8398 PT Time Calculation (min) (ACUTE ONLY): 20 min  Charges:    $Gait Training: 8-22 mins PT General Charges $$ ACUTE PT VISIT: 1 Visit                     Leita Sable, PT, DPT Acute Rehabilitation Services Secure Chat Preferred Office: [phone]    Leita JONETTA Sable 11/25/2024, 4:38 PM

## 2024-11-25 NOTE — Assessment & Plan Note (Addendum)
 Afebrile w/ high BP but otherwise stable vitals. S/p percutaneous cholecystomy tube w/ IR. LFTs, leukocytes downtrending.  - Pain regimen: Changed to oxycodone  5mg  q6 prn - Bowel regimen w/ miralax  bid and senna daily - Zofran  4mg  q6 prn for nausea - Maalox q6 prn, caution w/ aki, has not received since 12/13 - Change antibiotics to Ceftriaxone   and Flagyl  from Zosyn  in the setting of continued acute kidney injury  -GI consulted, appreciate their recommendations  -Endoscopic US  for investigation of pancreatic cysts on 12/16.  -NPO @ midnight  and holding heparin  at 0200.  - General surgery following  -AM CBC, CMP

## 2024-11-25 NOTE — Progress Notes (Addendum)
 Patient ID: Colin Keller, male   DOB: 1956/01/12, 68 y.o.   MRN: [id number]    Progress Note   Subjective   Day # 6 CC; abdominal pain-biliary obstruction Patient's status post ERCP with common bile duct stent 11/22/2024 for biliary stricture, no cytology/brushings done Abnormal imaging of pancreas New PE-on IV heparin   IV heparin  IV ceftriaxone   Patient underwent cholecystostomy tube placement last evening per IR due to concern for acute cholecystitis.  Tolerated procedure, patient says he is sore but otherwise feeling a bit better, he has been able to eat solid food, no nausea or vomiting.  Weak.  He has been afebrile overnight  Labs today-WBC 14.0/trending down Hemoglobin 7.9/hematocrit 23.2-stable Sodium 132/potassium 4.1/BUN 77/creatinine 3.98 T. bili 2.7/alk phos 948/AST 168/ALT 199 trending down  CA 19-9-elevated at 113/CEA within normal limits   Objective   Vital signs in last 24 hours: Temp:  [98.4 F (36.9 C)-98.5 F (36.9 C)] 98.4 F (36.9 C) (12/15 0819) Pulse Rate:  [78-93] 89 (12/15 0819) Resp:  [15-20] 19 (12/15 0819) BP: (154-182)/(70-97) 155/70 (12/15 0819) SpO2:  [95 %-99 %] 97 % (12/15 0819) Last BM Date : 11/19/24 General:   Older white male in NAD fatigued appearing Heart:  Regular rate and rhythm; no murmurs Lungs: Respirations even and unlabored, lungs CTA bilaterally Abdomen:  Soft, nontender and nondistended. Normal bowel sounds.  Cholecystostomy tube with bilious drainage Extremities:  Without edema. Neurologic:  Alert and oriented,  grossly normal neurologically. Psych:  Cooperative. Normal mood and affect.  Intake/Output from previous day: 12/14 0701 - 12/15 0700 In: 698.8 [I.V.:493.8; IV Piggyback:200] Out: 1215 [Urine:915; Drains:300] Intake/Output this shift: Total I/O In: 80 [P.O.:80] Out: 420 [Urine:300; Drains:120]  Lab Results: Recent Labs    11/23/24 0208 11/24/24 0130 11/25/24 0414  WBC 16.7* 18.8* 14.0*  HGB 7.5* 8.3*  7.9*  HCT 21.6* 23.4* 23.2*  PLT 264 276 247   BMET Recent Labs    11/23/24 0208 11/24/24 0130 11/25/24 0414  NA 135 132* 132*  K 3.7 3.5 4.1  CL 100 94* 101  CO2 23 23 17*  GLUCOSE 194* 214* 296*  BUN 61* 74* 77*  CREATININE 3.73* 4.09* 3.98*  CALCIUM  8.2* 8.0* 8.0*   LFT Recent Labs    11/24/24 0822 11/25/24 0414  PROT 6.0* 5.1*  ALBUMIN  1.9* 1.6*  AST 347* 168*  ALT 211* 199*  ALKPHOS 1,158* 948*  BILITOT 3.2* 2.7*  BILIDIR 1.8*  --   IBILI 1.4*  --    PT/INR No results for input(s): LABPROT, INR in the last 72 hours.  Studies/Results: IR Perc Cholecystostomy Result Date: 11/25/2024 INDICATION: 68 year old male with concern for acute cholecystitis. EXAM: Fluoroscopic and ultrasound-guided percutaneous cholecystostomy tube placement MEDICATIONS: The patient was currently receiving intravenous antibiotics as an inpatient, no additional antibiotics were administered. ANESTHESIA/SEDATION: Moderate (conscious) sedation was employed during this procedure. A total of Versed  2 mg and Fentanyl  100 mcg was administered intravenously. Moderate Sedation Time: 6 minutes. The patient's level of consciousness and vital signs were monitored continuously by radiology nursing throughout the procedure under my direct supervision. FLUOROSCOPY TIME:  One mGy reference air kerma COMPLICATIONS: None immediate. PROCEDURE: Informed written consent was obtained from the patient after a thorough discussion of the procedural risks, benefits and alternatives. All questions were addressed. Maximal Sterile Barrier Technique was utilized including caps, mask, sterile gowns, sterile gloves, sterile drape, hand hygiene and skin antiseptic. A timeout was performed prior to the initiation of the procedure. The patient was  placed supine on the angiographic table. The patient's right upper quadrant was then prepped and draped in normal sterile fashion with maximum sterile barrier. Ultrasound demonstrates a  distended gallbladder. Subdermal Local anesthesia was provided at the planned skin entry site. Under ultrasound guidance, deeper local anesthetic was provided through intercostal muscles and along the liver capsule. Ultrasound was used to puncture the gallbladder using a 18 gauge trocar needle via a subcostal, transabdominal approach. A 0.035 inch exchange wire was placed in the tract was dilated. A 10 French multipurpose drainage catheter was advanced into the gallbladder lumen. Approximately 100 cc of bilious aspirate was a vacuo aided. Contrast injection demonstrated nondistended gallbladder without visualization of the cystic duct. The drain was then secured in place using a 0-silk suture and a Stayfix device. A sterile dressing was applied. The tube was placed to bulb suction. A culture was sent to the lab for analysis. The patient tolerated procedure well without evidence of immediate complication was transferred back to the floor in stable condition. IMPRESSION: Successful placement of percutaneous, transabdominal 10 French cholecystostomy tube. Ester Sides, MD [facility] Electronically Signed   By: Ester Sides M.D.   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  Brushings unable to be done Status post cholecystostomy tube placement yesterday  Continues on IV ceftriaxone   Patient is afebrile, no significant abdominal pain just sore at cholecystostomy tube site.  Bilious nonpurulent effluent,  Leukocytosis improved, LFTs trending down  Etiology of the biliary stricture is undetermined at this time, patient needs EUS for further evaluation hopefully this can be accomplished later this admission, concern for possible malignant  stricture. MRI of the abdomen/MRCP showed diffuse biliary ductal dilation, evaluation of the pancreas was limited by lack of IV contrast, there were tiny foci/cystic foci within the pancreatic head which could represent chronic pancreatitis, however pancreatic mass could not definitely be excluded.  #2 new PE-query hypercoagulable state secondary to underlying malignancy-on IV heparin  #3 diabetes mellitus #4 anemia-stable-follow carefully as on IV heparin   #5 acute on chronic kidney disease-nephrology following   Plan; as above Continue to trend LFTs and CBC Continue IV ceftriaxone  We have reached out to advanced endoscopy/Dr. Mansouraty  who will review imaging, and determine if any availability later this week for EUS while patient inpatient GI will continue to follow with you   Addendum-we are able to schedule for upper EUS/possible FNA for tomorrow morning 11/26/2024 at 8 AM with Dr. Wilhelmenia. N.p.o. past midnight Hold heparin  at 2 AM 11/26/2024-timing regarding restart pending EUS.   Principal Problem:   Abdominal pain Active Problems:   T2DM (type 2 diabetes mellitus) (HCC)   AKI (acute kidney injury)   Acute pulmonary embolism (HCC)   Chronic health problem   Cholecystitis, acute   Pancreatic lesion   Biliary stricture (HCC)   Abnormal finding on GI tract imaging   Anticoagulated   Elevated liver enzymes   Delusions (HCC)   Biliary obstruction (HCC)   Leukocytosis   Biliary sepsis (HCC)   S/P ERCP     LOS: 6 days   Zaylynn Rickett EsterwoodPA-C  11/25/2024, 11:49 AM

## 2024-11-25 NOTE — Assessment & Plan Note (Addendum)
 HTN/CAD/CHF: hold home Coreg , diltiazem . Restart home hydralazine  50mg  q8h BPH: Continue home tamsulosin  GERD: Continue home Protonix 

## 2024-11-25 NOTE — Assessment & Plan Note (Addendum)
 AKI persistent but stable. Patient w/ foley catheter in to monitor UOP, appropriate to this point.  -Nephro consulted, appreciate their recommendations Renal US  pending Plan for renal biopsy after aspirin  wash out x 5 days (starting 12/16) Added foley for monitoring UOP Lab workup from Nephro includes the following: C3, C4, Anti DNA antibody, ANCA, hepatitis panel, ANA w/ reflex, kappa/lambda light chains, protein electrophoresis, microalbumin/Cr ratio, protein/Cr ratio - Change maintenance IVF to 85 mL/h LR - holding home lasix , hydrochlorothiazide  - AM CMP

## 2024-11-25 NOTE — Plan of Care (Signed)

## 2024-11-25 NOTE — H&P (View-Only) (Signed)
 Patient ID: Colin Keller, male   DOB: 1956/01/12, 68 y.o.   MRN: [id number]    Progress Note   Subjective   Day # 6 CC; abdominal pain-biliary obstruction Patient's status post ERCP with common bile duct stent 11/22/2024 for biliary stricture, no cytology/brushings done Abnormal imaging of pancreas New PE-on IV heparin   IV heparin  IV ceftriaxone   Patient underwent cholecystostomy tube placement last evening per IR due to concern for acute cholecystitis.  Tolerated procedure, patient says he is sore but otherwise feeling a bit better, he has been able to eat solid food, no nausea or vomiting.  Weak.  He has been afebrile overnight  Labs today-WBC 14.0/trending down Hemoglobin 7.9/hematocrit 23.2-stable Sodium 132/potassium 4.1/BUN 77/creatinine 3.98 T. bili 2.7/alk phos 948/AST 168/ALT 199 trending down  CA 19-9-elevated at 113/CEA within normal limits   Objective   Vital signs in last 24 hours: Temp:  [98.4 F (36.9 C)-98.5 F (36.9 C)] 98.4 F (36.9 C) (12/15 0819) Pulse Rate:  [78-93] 89 (12/15 0819) Resp:  [15-20] 19 (12/15 0819) BP: (154-182)/(70-97) 155/70 (12/15 0819) SpO2:  [95 %-99 %] 97 % (12/15 0819) Last BM Date : 11/19/24 General:   Older white male in NAD fatigued appearing Heart:  Regular rate and rhythm; no murmurs Lungs: Respirations even and unlabored, lungs CTA bilaterally Abdomen:  Soft, nontender and nondistended. Normal bowel sounds.  Cholecystostomy tube with bilious drainage Extremities:  Without edema. Neurologic:  Alert and oriented,  grossly normal neurologically. Psych:  Cooperative. Normal mood and affect.  Intake/Output from previous day: 12/14 0701 - 12/15 0700 In: 698.8 [I.V.:493.8; IV Piggyback:200] Out: 1215 [Urine:915; Drains:300] Intake/Output this shift: Total I/O In: 80 [P.O.:80] Out: 420 [Urine:300; Drains:120]  Lab Results: Recent Labs    11/23/24 0208 11/24/24 0130 11/25/24 0414  WBC 16.7* 18.8* 14.0*  HGB 7.5* 8.3*  7.9*  HCT 21.6* 23.4* 23.2*  PLT 264 276 247   BMET Recent Labs    11/23/24 0208 11/24/24 0130 11/25/24 0414  NA 135 132* 132*  K 3.7 3.5 4.1  CL 100 94* 101  CO2 23 23 17*  GLUCOSE 194* 214* 296*  BUN 61* 74* 77*  CREATININE 3.73* 4.09* 3.98*  CALCIUM  8.2* 8.0* 8.0*   LFT Recent Labs    11/24/24 0822 11/25/24 0414  PROT 6.0* 5.1*  ALBUMIN  1.9* 1.6*  AST 347* 168*  ALT 211* 199*  ALKPHOS 1,158* 948*  BILITOT 3.2* 2.7*  BILIDIR 1.8*  --   IBILI 1.4*  --    PT/INR No results for input(s): LABPROT, INR in the last 72 hours.  Studies/Results: IR Perc Cholecystostomy Result Date: 11/25/2024 INDICATION: 68 year old male with concern for acute cholecystitis. EXAM: Fluoroscopic and ultrasound-guided percutaneous cholecystostomy tube placement MEDICATIONS: The patient was currently receiving intravenous antibiotics as an inpatient, no additional antibiotics were administered. ANESTHESIA/SEDATION: Moderate (conscious) sedation was employed during this procedure. A total of Versed  2 mg and Fentanyl  100 mcg was administered intravenously. Moderate Sedation Time: 6 minutes. The patient's level of consciousness and vital signs were monitored continuously by radiology nursing throughout the procedure under my direct supervision. FLUOROSCOPY TIME:  One mGy reference air kerma COMPLICATIONS: None immediate. PROCEDURE: Informed written consent was obtained from the patient after a thorough discussion of the procedural risks, benefits and alternatives. All questions were addressed. Maximal Sterile Barrier Technique was utilized including caps, mask, sterile gowns, sterile gloves, sterile drape, hand hygiene and skin antiseptic. A timeout was performed prior to the initiation of the procedure. The patient was  placed supine on the angiographic table. The patient's right upper quadrant was then prepped and draped in normal sterile fashion with maximum sterile barrier. Ultrasound demonstrates a  distended gallbladder. Subdermal Local anesthesia was provided at the planned skin entry site. Under ultrasound guidance, deeper local anesthetic was provided through intercostal muscles and along the liver capsule. Ultrasound was used to puncture the gallbladder using a 18 gauge trocar needle via a subcostal, transabdominal approach. A 0.035 inch exchange wire was placed in the tract was dilated. A 10 French multipurpose drainage catheter was advanced into the gallbladder lumen. Approximately 100 cc of bilious aspirate was a vacuo aided. Contrast injection demonstrated nondistended gallbladder without visualization of the cystic duct. The drain was then secured in place using a 0-silk suture and a Stayfix device. A sterile dressing was applied. The tube was placed to bulb suction. A culture was sent to the lab for analysis. The patient tolerated procedure well without evidence of immediate complication was transferred back to the floor in stable condition. IMPRESSION: Successful placement of percutaneous, transabdominal 10 French cholecystostomy tube. Ester Sides, MD [facility] Electronically Signed   By: Ester Sides M.D.   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  Brushings unable to be done Status post cholecystostomy tube placement yesterday  Continues on IV ceftriaxone   Patient is afebrile, no significant abdominal pain just sore at cholecystostomy tube site.  Bilious nonpurulent effluent,  Leukocytosis improved, LFTs trending down  Etiology of the biliary stricture is undetermined at this time, patient needs EUS for further evaluation hopefully this can be accomplished later this admission, concern for possible malignant  stricture. MRI of the abdomen/MRCP showed diffuse biliary ductal dilation, evaluation of the pancreas was limited by lack of IV contrast, there were tiny foci/cystic foci within the pancreatic head which could represent chronic pancreatitis, however pancreatic mass could not definitely be excluded.  #2 new PE-query hypercoagulable state secondary to underlying malignancy-on IV heparin  #3 diabetes mellitus #4 anemia-stable-follow carefully as on IV heparin   #5 acute on chronic kidney disease-nephrology following   Plan; as above Continue to trend LFTs and CBC Continue IV ceftriaxone  We have reached out to advanced endoscopy/Dr. Mansouraty  who will review imaging, and determine if any availability later this week for EUS while patient inpatient GI will continue to follow with you   Addendum-we are able to schedule for upper EUS/possible FNA for tomorrow morning 11/26/2024 at 8 AM with Dr. Wilhelmenia. N.p.o. past midnight Hold heparin  at 2 AM 11/26/2024-timing regarding restart pending EUS.   Principal Problem:   Abdominal pain Active Problems:   T2DM (type 2 diabetes mellitus) (HCC)   AKI (acute kidney injury)   Acute pulmonary embolism (HCC)   Chronic health problem   Cholecystitis, acute   Pancreatic lesion   Biliary stricture (HCC)   Abnormal finding on GI tract imaging   Anticoagulated   Elevated liver enzymes   Delusions (HCC)   Biliary obstruction (HCC)   Leukocytosis   Biliary sepsis (HCC)   S/P ERCP     LOS: 6 days   Zaylynn Rickett EsterwoodPA-C  11/25/2024, 11:49 AM

## 2024-11-25 NOTE — Progress Notes (Signed)
 PHARMACY - ANTICOAGULATION  Pharmacy Consult for heparin  Indication: pulmonary embolus Brief A/P: aPTT within goal range Continue Heparin  at current rate   Allergies[1]  Patient Measurements: Height: 5' 10 (177.8 cm) Weight: 67.5 kg (148 lb 13 oz) IBW/kg (Calculated) : 73  Vital Signs: Temp: 98.4 F (36.9 C) (12/15 0511) Temp Source: Oral (12/15 0511) BP: 154/70 (12/15 0511) Pulse Rate: 93 (12/15 0511)  Labs: Recent Labs    11/22/24 1735 11/22/24 1844 11/23/24 0208 11/24/24 0130 11/25/24 0414  HGB 6.8*   < > 7.5* 8.3* 7.9*  HCT 19.8*   < > 21.6* 23.4* 23.2*  PLT 210   < > 264 276 247  APTT  --   --  38*  --  66*  HEPARINUNFRC  --   --  >1.10*  --  0.44  CREATININE 3.43*  --  3.73* 4.09* 3.98*  TROPONINIHS 25*  --   --   --   --    < > = values in this interval not displayed.    Estimated Creatinine Clearance: 16.5 mL/min (A) (by C-G formula based on SCr of 3.98 mg/dL (H)).  Assessment: 68 y.o. male with PE, Eliquis  on hold, for heparin   Goal of Therapy:  Heparin  level 0.3-0.7 units/ml aPTT 66-102 seconds Monitor platelets by anticoagulation protocol: Yes   Plan:  No change to heparin    Cathlyn Arrant, PharmD, BCPS  11/25/2024,5:15 AM        [1]  Allergies Allergen Reactions   Amlodipine  Other (See Comments)    Leg swelling   Empagliflozin  Other (See Comments)    Euglycemic DKA   Amitriptyline  Other (See Comments)    Urinary retention   Lisinopril  Other (See Comments)    Hyperkalemia    Nortriptyline  Hcl Other (See Comments)    Vivid / bad dreams   Statins     Aches in Joints    Simvastatin  Other (See Comments)    Arthralgias

## 2024-11-26 ENCOUNTER — Inpatient Hospital Stay (HOSPITAL_COMMUNITY)

## 2024-11-26 ENCOUNTER — Inpatient Hospital Stay (HOSPITAL_COMMUNITY): Admitting: Anesthesiology

## 2024-11-26 ENCOUNTER — Encounter (HOSPITAL_COMMUNITY): Payer: Self-pay | Admitting: Family Medicine

## 2024-11-26 ENCOUNTER — Encounter (HOSPITAL_COMMUNITY): Admission: EM | Disposition: A | Discharge status: Home Health | Payer: Self-pay | Source: Home / Self Care

## 2024-11-26 DIAGNOSIS — I2510 Atherosclerotic heart disease of native coronary artery without angina pectoris: Secondary | ICD-10-CM

## 2024-11-26 DIAGNOSIS — K315 Obstruction of duodenum: Secondary | ICD-10-CM

## 2024-11-26 DIAGNOSIS — T182XXA Foreign body in stomach, initial encounter: Secondary | ICD-10-CM

## 2024-11-26 DIAGNOSIS — K264 Chronic or unspecified duodenal ulcer with hemorrhage: Secondary | ICD-10-CM

## 2024-11-26 DIAGNOSIS — K449 Diaphragmatic hernia without obstruction or gangrene: Secondary | ICD-10-CM

## 2024-11-26 DIAGNOSIS — K831 Obstruction of bile duct: Secondary | ICD-10-CM | POA: Diagnosis not present

## 2024-11-26 DIAGNOSIS — K209 Esophagitis, unspecified without bleeding: Secondary | ICD-10-CM

## 2024-11-26 DIAGNOSIS — F17210 Nicotine dependence, cigarettes, uncomplicated: Secondary | ICD-10-CM

## 2024-11-26 DIAGNOSIS — K91841 Postprocedural hemorrhage and hematoma of a digestive system organ or structure following other procedure: Secondary | ICD-10-CM

## 2024-11-26 DIAGNOSIS — K2980 Duodenitis without bleeding: Secondary | ICD-10-CM | POA: Diagnosis not present

## 2024-11-26 HISTORY — PX: HEMOSTASIS CLIP PLACEMENT: SHX6857

## 2024-11-26 HISTORY — PX: ESOPHAGOGASTRODUODENOSCOPY: SHX5428

## 2024-11-26 LAB — CBC
HCT: 21.9 % — ABNORMAL LOW (ref 39.0–52.0)
HCT: 24.8 % — ABNORMAL LOW (ref 39.0–52.0)
HCT: 23.9 % — ABNORMAL LOW (ref 39.0–52.0)
Hemoglobin: 7.6 g/dL — ABNORMAL LOW (ref 13.0–17.0)
Hemoglobin: 8.4 g/dL — ABNORMAL LOW (ref 13.0–17.0)
Hemoglobin: 7.9 g/dL — ABNORMAL LOW (ref 13.0–17.0)
MCH: 31.4 pg (ref 26.0–34.0)
MCH: 30.9 pg (ref 26.0–34.0)
MCH: 31 pg (ref 26.0–34.0)
MCHC: 34.7 g/dL (ref 30.0–36.0)
MCHC: 33.9 g/dL (ref 30.0–36.0)
MCHC: 33.1 g/dL (ref 30.0–36.0)
MCV: 90.5 fL (ref 80.0–100.0)
MCV: 91.2 fL (ref 80.0–100.0)
MCV: 93.7 fL (ref 80.0–100.0)
Platelets: 251 K/uL (ref 150–400)
Platelets: 284 K/uL (ref 150–400)
Platelets: 274 K/uL (ref 150–400)
RBC: 2.42 MIL/uL — ABNORMAL LOW (ref 4.22–5.81)
RBC: 2.72 MIL/uL — ABNORMAL LOW (ref 4.22–5.81)
RBC: 2.55 MIL/uL — ABNORMAL LOW (ref 4.22–5.81)
RDW: 15.1 % (ref 11.5–15.5)
RDW: 15.3 % (ref 11.5–15.5)
RDW: 15.1 % (ref 11.5–15.5)
WBC: 11.4 K/uL — ABNORMAL HIGH (ref 4.0–10.5)
WBC: 13.3 K/uL — ABNORMAL HIGH (ref 4.0–10.5)
WBC: 11.6 K/uL — ABNORMAL HIGH (ref 4.0–10.5)
nRBC: 0 % (ref 0.0–0.2)
nRBC: 0 % (ref 0.0–0.2)
nRBC: 0 % (ref 0.0–0.2)

## 2024-11-26 LAB — GLUCOSE, CAPILLARY
Glucose-Capillary: 144 mg/dL — ABNORMAL HIGH (ref 70–99)
Glucose-Capillary: 169 mg/dL — ABNORMAL HIGH (ref 70–99)
Glucose-Capillary: 169 mg/dL — ABNORMAL HIGH (ref 70–99)
Glucose-Capillary: 202 mg/dL — ABNORMAL HIGH (ref 70–99)
Glucose-Capillary: 288 mg/dL — ABNORMAL HIGH (ref 70–99)
Glucose-Capillary: 267 mg/dL — ABNORMAL HIGH (ref 70–99)

## 2024-11-26 LAB — COMPREHENSIVE METABOLIC PANEL WITH GFR
ALT: 129 U/L — ABNORMAL HIGH (ref 0–44)
AST: 43 U/L — ABNORMAL HIGH (ref 15–41)
Albumin: 1.6 g/dL — ABNORMAL LOW (ref 3.5–5.0)
Alkaline Phosphatase: 721 U/L — ABNORMAL HIGH (ref 38–126)
Anion gap: 9 (ref 5–15)
BUN: 75 mg/dL — ABNORMAL HIGH (ref 8–23)
CO2: 22 mmol/L (ref 22–32)
Calcium: 7.9 mg/dL — ABNORMAL LOW (ref 8.9–10.3)
Chloride: 106 mmol/L (ref 98–111)
Creatinine, Ser: 3.91 mg/dL — ABNORMAL HIGH (ref 0.61–1.24)
GFR, Estimated: 16 mL/min — ABNORMAL LOW (ref >60)
Glucose, Bld: 130 mg/dL — ABNORMAL HIGH (ref 70–99)
Potassium: 3.8 mmol/L (ref 3.5–5.1)
Sodium: 137 mmol/L (ref 135–145)
Total Bilirubin: 1.5 mg/dL — ABNORMAL HIGH (ref 0.0–1.2)
Total Protein: 5.1 g/dL — ABNORMAL LOW (ref 6.5–8.1)

## 2024-11-26 LAB — PROTEIN ELECTROPHORESIS, SERUM
A/G Ratio: 0.7 (ref 0.7–1.7)
Albumin ELP: 1.9 g/dL — ABNORMAL LOW (ref 2.9–4.4)
Alpha-1-Globulin: 0.3 g/dL (ref 0.0–0.4)
Alpha-2-Globulin: 0.8 g/dL (ref 0.4–1.0)
Beta Globulin: 0.7 g/dL (ref 0.7–1.3)
Gamma Globulin: 0.7 g/dL (ref 0.4–1.8)
Globulin, Total: 2.6 g/dL (ref 2.2–3.9)
Total Protein ELP: 4.5 g/dL — ABNORMAL LOW (ref 6.0–8.5)

## 2024-11-26 LAB — HEPARIN LEVEL (UNFRACTIONATED): Heparin Unfractionated: 0.38 [IU]/mL (ref 0.30–0.70)

## 2024-11-26 LAB — MISC LABCORP TEST (SEND OUT): Labcorp test code: 141330

## 2024-11-26 LAB — C3 COMPLEMENT: C3 Complement: 114 mg/dL (ref 82–167)

## 2024-11-26 LAB — ENA+DNA/DS+ANTICH+CENTRO+JO...
Anti JO-1: <0.2 AI (ref 0.0–0.9)
Centromere Ab Screen: 1.8 AI — ABNORMAL HIGH (ref 0.0–0.9)
Chromatin Ab SerPl-aCnc: <0.2 AI (ref 0.0–0.9)
ENA SM Ab Ser-aCnc: <0.2 AI (ref 0.0–0.9)
Ribonucleic Protein: <0.2 AI (ref 0.0–0.9)
SSA (Ro) (ENA) Antibody, IgG: 0.3 AI (ref 0.0–0.9)
SSB (La) (ENA) Antibody, IgG: 0.2 AI (ref 0.0–0.9)
Scleroderma (Scl-70) (ENA) Antibody, IgG: <0.2 AI (ref 0.0–0.9)
ds DNA Ab: 2 [IU]/mL (ref 0–9)

## 2024-11-26 LAB — LIPASE, BLOOD: Lipase: <10 U/L — ABNORMAL LOW (ref 11–51)

## 2024-11-26 LAB — ANTI-DNA ANTIBODY, DOUBLE-STRANDED: ds DNA Ab: 2 [IU]/mL (ref 0–9)

## 2024-11-26 LAB — KAPPA/LAMBDA LIGHT CHAINS
Kappa free light chain: 57.5 mg/L — ABNORMAL HIGH (ref 3.3–19.4)
Kappa, lambda light chain ratio: 1.01 (ref 0.26–1.65)
Lambda free light chains: 56.8 mg/L — ABNORMAL HIGH (ref 5.7–26.3)

## 2024-11-26 LAB — APTT: aPTT: 38 s — ABNORMAL HIGH (ref 24–36)

## 2024-11-26 LAB — ANA W/REFLEX IF POSITIVE: Anti Nuclear Antibody (ANA): POSITIVE — AB

## 2024-11-26 LAB — C4 COMPLEMENT: Complement C4, Body Fluid: 19 mg/dL (ref 12–38)

## 2024-11-26 SURGERY — EGD (ESOPHAGOGASTRODUODENOSCOPY)
Anesthesia: Monitor Anesthesia Care

## 2024-11-26 MED ORDER — SODIUM CHLORIDE 0.9 % IV SOLN: INTRAVENOUS | Status: DC | PRN

## 2024-11-26 MED ORDER — PROPOFOL 1000 MG/100ML IV EMUL
INTRAVENOUS | Status: AC
  Filled 2024-11-26: qty 300

## 2024-11-26 MED ORDER — PROPOFOL 500 MG/50ML IV EMUL
INTRAVENOUS | Status: DC | PRN
  Administered 2024-11-26: 08:00:00 30 mg via INTRAVENOUS
  Administered 2024-11-26: 08:00:00 80 ug/kg/min via INTRAVENOUS

## 2024-11-26 MED ORDER — FENTANYL CITRATE (PF) 100 MCG/2ML IJ SOLN
INTRAMUSCULAR | Status: AC
  Filled 2024-11-26: qty 2

## 2024-11-26 MED ORDER — ONDANSETRON HCL 4 MG/2ML IJ SOLN
INTRAMUSCULAR | Status: DC | PRN
  Administered 2024-11-26: 09:00:00 4 mg via INTRAVENOUS

## 2024-11-26 MED ORDER — PHENYLEPHRINE HCL-NACL 20-0.9 MG/250ML-% IV SOLN
INTRAVENOUS | Status: AC
  Filled 2024-11-26: qty 500

## 2024-11-26 MED ORDER — GLUCAGON HCL RDNA (DIAGNOSTIC) 1 MG IJ SOLR
INTRAMUSCULAR | Status: AC
  Filled 2024-11-26: qty 1

## 2024-11-26 MED ORDER — PANTOPRAZOLE SODIUM 40 MG PO TBEC
40.0000 mg | DELAYED_RELEASE_TABLET | Freq: Two times a day (BID) | ORAL | Status: DC
  Administered 2024-11-26 – 2024-11-29 (×6): 40 mg via ORAL
  Filled 2024-11-26 (×6): qty 1

## 2024-11-26 MED ORDER — EPHEDRINE SULFATE (PRESSORS) 25 MG/5ML IV SOSY
PREFILLED_SYRINGE | INTRAVENOUS | Status: DC | PRN
  Administered 2024-11-26: 08:00:00 10 mg via INTRAVENOUS

## 2024-11-26 MED ORDER — SUCRALFATE 1 G PO TABS
1.0000 g | ORAL_TABLET | Freq: Two times a day (BID) | ORAL | Status: DC
  Administered 2024-11-26 – 2024-12-06 (×19): 1 g via ORAL
  Filled 2024-11-26 (×20): qty 1

## 2024-11-26 MED ORDER — FENTANYL CITRATE (PF) 100 MCG/2ML IJ SOLN
INTRAMUSCULAR | Status: DC | PRN
  Administered 2024-11-26: 08:00:00 50 ug via INTRAVENOUS

## 2024-11-26 MED ORDER — SODIUM CHLORIDE 0.9 % IV SOLN
2.0000 g | INTRAVENOUS | Status: AC
  Administered 2024-11-26 – 2024-11-28 (×3): 2 g via INTRAVENOUS
  Filled 2024-11-26 (×3): qty 20

## 2024-11-26 NOTE — Interval H&P Note (Signed)
 History and Physical Interval Note:  11/26/2024 7:50 AM  Alm LELON Gaskins  has presented today for surgery, with the diagnosis of Jaundice, biliary stricture.  The various methods of treatment have been discussed with the patient and family. After consideration of risks, benefits and other options for treatment, the patient has consented to  Procedures with comments: ULTRASOUND, UPPER GI TRACT, ENDOSCOPIC (N/A) - possible FNA as a surgical intervention.  The patient's history has been reviewed, patient examined, no change in status, stable for surgery.  I have reviewed the patient's chart and labs.  Questions were answered to the patient's satisfaction.     The risks of an EUS including intestinal perforation, bleeding, infection, aspiration, and medication effects were discussed as was the possibility it may not give a definitive diagnosis if a biopsy is performed.  When a biopsy of the pancreas is done as part of the EUS, there is an additional risk of pancreatitis at the rate of about 1-2%.  It was explained that procedure related pancreatitis is typically mild, although it can be severe and even life threatening, which is why we do not perform random pancreatic biopsies and only biopsy a lesion/area we feel is concerning enough to warrant the risk.    Huston Stonehocker Mansouraty Jr

## 2024-11-26 NOTE — Progress Notes (Signed)
 Referring Physician(s): Delores Fought, MD (FMTS)   Supervising Physician: Jenna Hacker  Patient Status:  [facility] - In-pt  Chief Complaint:  S/p cholecystostomy tube 12/14 by Dr. Jennefer  Brief History: Colin Keller is a 68 y.o. male with a past medical history significant for carotid artery stenosis, CHF, CAD s/p CABG x 3, HTN, HLD, PAD, PE, CKD III, DM. He presented to the ED on 12/9 with complaints of abdominal pain (RUQ) which had been present for several weeks but recently worsened. He underwent CT abd/pelvis w/o contrast which showed biliary duct dilation and gall bladder features concerning for acute cholecystitis.   He was admitted and underwent MRCP which noted diffuse biliary ductal dilatation and distal CBD stricture without evidence of choledocholithiasis. HIDA scan was also attempted but was non diagnostic. He underwent ERCP with GI on 12/12 which showed distal biliary stricture 3 cm on unclear etiology, plastic stent was placed.   After that procedure he developed worsening RUQ pain, nausea and reported emesis with worsening leukocytosis. He is not currently a candidate for cholecystectomy which led team to request IR perc chole which was approved and completed by Dr. Jennefer on 12/14.  Patient is also currently on IR tech list for 12/22 non-targeted renal bx, scheduled at this date for asa washout. Please see note from Carim, PA-C from 12/15 for details pertaining to this.  Pt underwent EGD today but unable to perform EUS given food in stomach .  Subjective:  Patient resting in bed reports improved abdominal pain. Denies fever, chills, N/V. Tells me that he is looking forward to when he can be discharged. No concerns with drain.    Allergies: Amlodipine , Empagliflozin , Amitriptyline , Lisinopril , Nortriptyline  hcl, Statins, and Simvastatin   Medications: Prior to Admission medications  Medication Sig Start Date End Date Taking? Authorizing Provider  APIXABAN   (ELIQUIS ) VTE STARTER PACK (10MG  AND 5MG ) Take as directed on package: start with two-5mg  tablets twice daily for 7 days. On day 8, switch to one-5mg  tablet twice daily. 11/16/24  Yes Gherghe, Costin M, MD  aspirin  81 MG tablet Take 81 mg by mouth daily.   Yes [provider]  carvedilol  (COREG ) 6.25 MG tablet Take 1 tablet (6.25 mg total) by mouth 2 (two) times daily with a meal. 11/17/24  Yes Elicia Hamlet, MD  cetirizine (ZYRTEC) 10 MG chewable tablet Chew 10 mg by mouth as needed for allergies.   Yes [provider]  diltiazem  (CARDIZEM  CD) 180 MG 24 hr capsule Take 180 mg by mouth daily. 10/29/24  Yes [provider]  Evolocumab  (REPATHA  SURECLICK) 140 MG/ML SOAJ Inject 140 mg into the skin every 14 (fourteen) days. 03/12/24  Yes Darron Deatrice LABOR, MD  furosemide  (LASIX ) 40 MG tablet Take 0.5 tablets (20 mg total) by mouth daily. 11/18/24  Yes Elicia Hamlet, MD  Glucagon  (BAQSIMI  TWO PACK) 3 MG/DOSE POWD Use as directed PRN low glucose 10/30/24  Yes McDiarmid, Krystal BIRCH, MD  hydrALAZINE  (APRESOLINE ) 50 MG tablet Take 1 tablet (50 mg total) by mouth 3 (three) times daily. 08/22/24 08/17/25 Yes McDiarmid, Krystal BIRCH, MD  hydrochlorothiazide  (HYDRODIURIL ) 12.5 MG tablet Take 1 tablet (12.5 mg total) by mouth daily. 10/30/24  Yes McDiarmid, Krystal BIRCH, MD  HYDROcodone -acetaminophen  (NORCO) 10-325 MG tablet TAKE 1 TABLET BY MOUTH TWICE DAILY AS NEEDED Patient taking differently: Take 1 tablet by mouth in the morning and at bedtime. 11/06/24  Yes McDiarmid, Krystal BIRCH, MD  Insulin  Glargine (BASAGLAR  KWIKPEN) 100 UNIT/ML DIAL AND INJECT 20  UNITS UNDER THE SKIN DAILY. Patient taking differently: Inject 10-12 Units into the skin daily. 08/19/24  Yes McDiarmid, Krystal BIRCH, MD  insulin  lispro (HUMALOG ) 100 UNIT/ML injection Inject 0.03-0.05 mLs (3-5 Units total) into the skin 3 (three) times daily with meals. 3-4 units prior to breakfast, 4-5 units prior to evening meal 08/08/24 02/04/25 Yes McDiarmid, Krystal BIRCH, MD   metFORMIN  (GLUCOPHAGE ) 500 MG tablet Take 2 tablets (1,000 mg total) by mouth 2 (two) times daily with a meal. TAKE 2 TABLETS BY MOUTH TWICE DAILY WITH A MEAL 10/23/23 11/15/25 Yes McDiarmid, Krystal BIRCH, MD  pantoprazole  (PROTONIX ) 40 MG tablet Take 1 tablet (40 mg total) by mouth daily. 11/18/24  Yes Elicia Hamlet, MD  promethazine  (PHENERGAN ) 25 MG tablet Take 1 tablet (25 mg total) by mouth every 6 (six) hours as needed for nausea or vomiting. Patient taking differently: Take 12.5-25 mg by mouth every 6 (six) hours as needed for nausea or vomiting. 09/24/24  Yes McDiarmid, Krystal BIRCH, MD  tamsulosin  (FLOMAX ) 0.4 MG CAPS capsule Take 1 capsule (0.4 mg total) by mouth daily. 02/20/24  Yes McDiarmid, Krystal BIRCH, MD  apixaban  (ELIQUIS ) 5 MG TABS tablet Take 1 tablet (5 mg total) by mouth 2 (two) times daily. To begin after completion of Eliquis  (apixaban ) VTE starter pack Patient not taking: Reported on 11/19/2024 12/17/24   Trixie Nilda HERO, MD     Vital Signs: BP (!) 174/72   Pulse 71   Temp 97.6 F (36.4 C)   Resp 18   Ht 5' 10 (1.778 m)   Wt 148 lb 13 oz (67.5 kg)   SpO2 99%   BMI 21.35 kg/m   Physical Exam HENT:     Mouth/Throat:     Mouth: Mucous membranes are moist.     Pharynx: Oropharynx is clear.  Pulmonary:     Effort: Pulmonary effort is normal.  Abdominal:     General: There is no distension.     Palpations: Abdomen is soft.     Tenderness: There is no abdominal tenderness.     Comments: RUQ perc chole present with dark bilious output present in bulb. Insertion site unremarkable apart from old, dried blood on dressing. Flushes easily. Nontender, ext suture intact  Skin:    General: Skin is warm and dry.  Psychiatric:        Behavior: Behavior normal.        Thought Content: Thought content normal.     Imaging: US  RENAL Result Date: 11/25/2024 EXAM: US  Retroperitoneum Complete, Renal. 11/25/2024 03:04:13 PM TECHNIQUE: Real-time ultrasonography of the retroperitoneum renal was  performed. COMPARISON: US  Renal 01/22/2020. CLINICAL HISTORY: Acute renal failure superimposed on stage 3 chronic kidney disease, unspecified acute renal failure type, unspecified whether stage 3a or 3b CKD. FINDINGS: LIMITATIONS: There is limited visualization of the kidney secondary to bandage. RIGHT KIDNEY/URETER: Right kidney measures 9.2 x 4.9 x 4.8 cm. Normal cortical echogenicity. No hydronephrosis. No calculus. No mass. LEFT KIDNEY/URETER: Left kidney measures 9.5 x 5.9 x 5.1 cm. Normal cortical echogenicity. No hydronephrosis. No calculus. No mass. BLADDER: Foley catheter decompresses the bladder. IMPRESSION: 1. Limited visualization of the kidneys due to overlying bandage. No hydronephrosis. Electronically signed by: Greig Pique MD 11/25/2024 08:01 PM EST RP Workstation: [id number]   IR Perc Cholecystostomy Result Date: 11/25/2024 INDICATION: 68 year old male with concern for acute cholecystitis. EXAM: Fluoroscopic and ultrasound-guided percutaneous cholecystostomy tube placement MEDICATIONS: The patient was currently receiving intravenous antibiotics as an inpatient, no additional antibiotics were administered. ANESTHESIA/SEDATION:  Moderate (conscious) sedation was employed during this procedure. A total of Versed  2 mg and Fentanyl  100 mcg was administered intravenously. Moderate Sedation Time: 6 minutes. The patient's level of consciousness and vital signs were monitored continuously by radiology nursing throughout the procedure under my direct supervision. FLUOROSCOPY TIME:  One mGy reference air kerma COMPLICATIONS: None immediate. PROCEDURE: Informed written consent was obtained from the patient after a thorough discussion of the procedural risks, benefits and alternatives. All questions were addressed. Maximal Sterile Barrier Technique was utilized including caps, mask, sterile gowns, sterile gloves, sterile drape, hand hygiene and skin antiseptic. A timeout was performed prior to the initiation  of the procedure. The patient was placed supine on the angiographic table. The patient's right upper quadrant was then prepped and draped in normal sterile fashion with maximum sterile barrier. Ultrasound demonstrates a distended gallbladder. Subdermal Local anesthesia was provided at the planned skin entry site. Under ultrasound guidance, deeper local anesthetic was provided through intercostal muscles and along the liver capsule. Ultrasound was used to puncture the gallbladder using a 18 gauge trocar needle via a subcostal, transabdominal approach. A 0.035 inch exchange wire was placed in the tract was dilated. A 10 French multipurpose drainage catheter was advanced into the gallbladder lumen. Approximately 100 cc of bilious aspirate was a vacuo aided. Contrast injection demonstrated nondistended gallbladder without visualization of the cystic duct. The drain was then secured in place using a 0-silk suture and a Stayfix device. A sterile dressing was applied. The tube was placed to bulb suction. A culture was sent to the lab for analysis. The patient tolerated procedure well without evidence of immediate complication was transferred back to the floor in stable condition. IMPRESSION: Successful placement of percutaneous, transabdominal 10 French cholecystostomy tube. Ester Sides, MD [facility] Radiology Electronically Signed   By: Ester Sides M.D.   On: 11/25/2024 07:27   ECHOCARDIOGRAM LIMITED Result Date: 11/23/2024    ECHOCARDIOGRAM LIMITED REPORT   Patient Name:   Colin Keller Date of Exam: 11/23/2024 Medical Rec #:  [id number]     Height:       70.0 in Accession #:    [id number]    Weight:       148.8 lb Date of Birth:  06/01/1956     BSA:          1.841 m Patient Age:    68 years      BP:           131/69 mmHg Patient Gender: M             HR:           59 bpm. Exam Location:  Inpatient Procedure: Limited Echo (Both Spectral and Color Flow Doppler were  utilized            during procedure). Indications:    R94.31 Abnormal EKG  History:        Patient has prior history of Echocardiogram examinations, most                 recent 11/16/2024. CAD.  Sonographer:    Nathanel Devonshire Referring Phys: [id number] TINNIE FORBES FURTH IMPRESSIONS  1. Limited echo with limited images  2. Left ventricular ejection fraction, by estimation, is 50 to 55%. Left ventricular ejection fraction by 2D MOD biplane is 52.0 %. The left ventricle has low normal function. The left ventricle demonstrates global hypokinesis. There is mild left ventricular hypertrophy. Left ventricular diastolic parameters  are consistent with Grade III diastolic dysfunction (restrictive). Elevated left ventricular end-diastolic pressure.  3. The mitral valve is degenerative. Trivial mitral valve regurgitation.  4. The inferior vena cava is normal in size with <50% respiratory variability, suggesting right atrial pressure of 8 mmHg. Comparison(s): Changes from prior study are noted. 11/16/2024: LVEF 45-50%, grade III DD. FINDINGS  Left Ventricle: Left ventricular ejection fraction, by estimation, is 50 to 55%. Left ventricular ejection fraction by 2D MOD biplane is 52.0 %. The left ventricle has low normal function. The left ventricle demonstrates global hypokinesis. There is mild left ventricular hypertrophy. Left ventricular diastolic parameters are consistent with Grade III diastolic dysfunction (restrictive). Elevated left ventricular end-diastolic pressure. Mitral Valve: The mitral valve is degenerative in appearance. Mild to moderate mitral annular calcification. Trivial mitral valve regurgitation. Venous: The inferior vena cava is normal in size with less than 50% respiratory variability, suggesting right atrial pressure of 8 mmHg.  LV Volumes (MOD)               Biplane EF (MOD) LV vol d, MOD    131.0 ml      LV Biplane EF:   Left A2C:                                            ventricular LV vol d, MOD    145.0 ml                        ejection A4C:                                            fraction by LV vol s, MOD    60.3 ml                        2D MOD A2C:                                            biplane is LV vol s, MOD    73.1 ml                        52.0 %. A4C: LV SV MOD A2C:   70.7 ml       Diastology LV SV MOD A4C:   145.0 ml      LV e' medial:   3.48 cm/s LV SV MOD BP:    73.1 ml       LV E/e' medial: 34.8  MITRAL VALVE MV Area (PHT): 4.63 cm MV Decel Time: 164 msec MV E velocity: 121.00 cm/s MV A velocity: 51.90 cm/s MV E/A ratio:  2.33 Vinie Maxcy MD Electronically signed by Vinie Maxcy MD Signature Date/Time: 11/23/2024/12:26:13 PM    Final    DG C-Arm 1-60 Min-No Report Result Date: 11/22/2024 Fluoroscopy was utilized by the requesting physician.  No radiographic interpretation.   DG C-Arm 1-60 Min-No Report Result Date: 11/22/2024 Fluoroscopy was utilized by the requesting physician.  No radiographic interpretation.    Labs:  CBC: Recent Labs    11/23/24 0208 11/24/24 0130 11/25/24 0414 11/26/24  0440  WBC 16.7* 18.8* 14.0* 11.4*  HGB 7.5* 8.3* 7.9* 7.6*  HCT 21.6* 23.4* 23.2* 21.9*  PLT 264 276 247 251    COAGS: Recent Labs    11/22/24 0344 11/23/24 0208 11/25/24 0414 11/26/24 0440  APTT 87* 38* 66* 38*    BMP: Recent Labs    11/23/24 0208 11/24/24 0130 11/25/24 0414 11/26/24 0440  NA 135 132* 132* 137  K 3.7 3.5 4.1 3.8  CL 100 94* 101 106  CO2 23 23 17* 22  GLUCOSE 194* 214* 296* 130*  BUN 61* 74* 77* 75*  CALCIUM  8.2* 8.0* 8.0* 7.9*  CREATININE 3.73* 4.09* 3.98* 3.91*  GFRNONAA 17* 15* 15* 16*    LIVER FUNCTION TESTS: Recent Labs    11/23/24 0208 11/24/24 0822 11/25/24 0414 11/26/24 0440  BILITOT 2.8* 3.2* 2.7* 1.5*  AST 93* 347* 168* 43*  ALT 143* 211* 199* 129*  ALKPHOS 450* 1,158* 948* 721*  PROT 5.7* 6.0* 5.1* 5.1*  ALBUMIN  2.0* 1.9* 1.6* 1.6*    Assessment and Plan:  Drain Location: RUQ Size: Fr size: 10 Fr Date of  placement: 12/14 by Dr. Jennefer  Currently to: Drain collection device: suction bulb 24 hour output:  Output by Drain (mL) 11/24/24 0701 - 11/24/24 1900 11/24/24 1901 - 11/25/24 0700 11/25/24 0701 - 11/25/24 1900 11/25/24 1901 - 11/26/24 0700 11/26/24 0701 - 11/26/24 1356  Biliary Tube Cholecystostomy 10 Fr. RUQ 40 260 410  60    Interval imaging/drain manipulation:  None  Current examination: Flushes easily.  Insertion site unremarkable. Suture and stat lock in place. Dressed appropriately.  T bili improving after stent, WBC improved again today (11.4<14<18.8) Afebrile, hypertensive, VS unremark otherwise  Plan: Continue TID flushes with 5 cc NS. Record output Q shift. Dressing changes QD or PRN if soiled.  Call IR APP or on call IR MD if difficulty flushing or sudden change in drain output. Will follow GI and surgery notes to direct ongoing planning     Discharge planning: Please contact IR APP or on call IR MD prior to patient d/c to ensure appropriate follow up plans are in place. Patient will need to flush drain QD with 5 cc NS, record output QD, dressing changes every 2-3 days or earlier if soiled.   The following is typical course associated with perc chole drains:   Percutaneous cholecystostomy drain to remain in place at least 6 weeks.   Recommend fluoroscopy with injection of the drain in IR to evaluate for patency of the cystic duct.  If the duct is patent and general surgery feels patient is stable for cholecystectomy, the drain would be removed at time of surgery.  If the duct is patent and general surgery feels patient is NEVER a candidate for cholecystectomy, drain can be capped for a trial. If symptoms recur, then place to gravity bag again.  If trial is successful, discuss possible removal of the drain. If trial in unsuccessful, then patient will need routine exchanges of the  chole tube about every 8-10 weeks.  Please call the IR PA at [phone] when patient  is about to be discharged and we will arrange the follow up drain injection (ok to leave message).    IR will continue to follow - please call with questions or concerns.     Electronically Signed: Laymon Coast, NP 11/26/2024, 1:15 PM   I spent a total of 15 Minutes at the the patient's bedside AND on the patient's hospital floor or unit, greater  than 50% of which was counseling/coordinating care for s/p perc chole 12/14 by Dr. Jennefer

## 2024-11-26 NOTE — Assessment & Plan Note (Addendum)
 VSS. WBCs and LFTs downtrending reassuringly. Percutaneous cholecystostomy tube in place. Pt's clinical status significantly improved vs prior exam.  -GI consulted, appreciate their recommendations -S/p EUS This AM - US  limited by food in stomach but clips applied to a single site of bleeding.   -Repeat attempt EUS tentatively scheduled for 12/18 pending availability - Pain regimen: Continue oxycodone  5mg  q6 prn - Bowel regimen w/ miralax  bid and senna daily - Zofran  4mg  q6 prn for nausea - Continue CTX and flagyl  until 5 days post-cholecystostomy tube placement (11-25-11/19) - General surgery following  -AM CBC, CMP

## 2024-11-26 NOTE — Anesthesia Preprocedure Evaluation (Addendum)
 Anesthesia Evaluation  Patient identified by MRN, date of birth, ID band Patient awake    Reviewed: Allergy & Precautions, NPO status , Patient's Chart, lab work & pertinent test results, reviewed documented beta blocker date and time   History of Anesthesia Complications Negative for: history of anesthetic complications  Airway Mallampati: II  TM Distance: >3 FB     Dental  (+) Edentulous Upper, Edentulous Lower   Pulmonary shortness of breath, neg COPD, Current Smoker and Patient abstained from smoking., PE   breath sounds clear to auscultation       Cardiovascular hypertension, + angina with exertion + CAD, + Peripheral Vascular Disease and +CHF  (-) Past MI + dysrhythmias  Rhythm:Regular Rate:Normal     Neuro/Psych neg Seizures  Neuromuscular disease    GI/Hepatic ,GERD  ,,(+) neg Cirrhosis        Endo/Other  diabetes, Type 2    Renal/GU CRFRenal disease     Musculoskeletal   Abdominal   Peds  Hematology  (+) Blood dyscrasia, anemia   Anesthesia Other Findings   Reproductive/Obstetrics                              Anesthesia Physical Anesthesia Plan  ASA: 3  Anesthesia Plan: MAC   Post-op Pain Management:    Induction: Intravenous  PONV Risk Score and Plan: 1 and Ondansetron  and Propofol  infusion  Airway Management Planned: Natural Airway and Simple Face Mask  Additional Equipment:   Intra-op Plan:   Post-operative Plan:   Informed Consent: I have reviewed the patients History and Physical, chart, labs and discussed the procedure including the risks, benefits and alternatives for the proposed anesthesia with the patient or authorized representative who has indicated his/her understanding and acceptance.       Plan Discussed with: CRNA  Anesthesia Plan Comments:          Anesthesia Quick Evaluation

## 2024-11-26 NOTE — Plan of Care (Signed)

## 2024-11-26 NOTE — Progress Notes (Signed)
 Daily Progress Note Intern Pager: [phone]  Patient name: Colin Keller Medical record number: [id number] Date of birth: 1956/01/08 Age: 68 y.o. Gender: male  Primary Care Provider: McDiarmid, Krystal BIRCH, MD Consultants: Nephro, IR, GI, Gen Surg Code Status: Full  Pt Overview and Major Events to Date:  12-9 admitted 12-12 ERCP, transfer to ICU for persistent bradycardia 12-14 return to floor from ICU, IR and Nephro consulted, IR placed cholecystostomy tube 12-16 Endoscopy performed  Medical Decision Making:  68 yo m w/ hx of recent small PE now s/p ERCP for CBD dilatation and cholecystostomy tube by IR on 12/14. VSS, LFTs and WBC continue to downtrend, patient's clinical status notably improved this afternoon after EUS. Unable to complete EUS due to food present in fundus, one bleeding ulcer clipped and repeat EUS scheduled tentatively for 12/18 to investigate pancreatic cysts that could be contributing to his CBD dilatation. Patient has had persistent AKI while on zosyn , now on CTX and flagyl  for 5 days after cholecystostomy tube placement. Nephrology recommending renal biopsy after aspirin  wash out. Watching hgb closely, holding heparin  until 12/17 AM given endoscopy findings.  Assessment & Plan Abdominal pain Biliary stricture (HCC) Biliary sepsis (HCC) VSS. WBCs and LFTs downtrending reassuringly. Percutaneous cholecystostomy tube in place. Pt's clinical status significantly improved vs prior exam.  -GI consulted, appreciate their recommendations -S/p EUS This AM - US  limited by food in stomach but clips applied to a single site of bleeding.   -Repeat attempt EUS tentatively scheduled for 12/18 pending availability - Pain regimen: Continue oxycodone  5mg  q6 prn - Bowel regimen w/ miralax  bid and senna daily - Zofran  4mg  q6 prn for nausea - Continue CTX and flagyl  until 5 days post-cholecystostomy tube placement (11-25-11/19) - General surgery following  -AM CBC, CMP AKI (acute  kidney injury) AKI persistent but stable. UOP reassuring, ~1733mL last 24 hours.  -Nephro consulted, appreciate their recommendations Renal US  WNL Plan for renal biopsy (tentatively 12/22) after aspirin  wash out x 5 days (starting 12/16) Foley in place to monitor UOP Lab workup from Nephro includes the following: C3, C4, Anti DNA antibody, ANCA, hepatitis panel, ANA w/ reflex, kappa/lambda light chains, protein electrophoresis, microalbumin/Cr ratio, protein/Cr ratio - D/c'd IVF - holding home lasix , hydrochlorothiazide  - AM CMP Anemia Pt is s/p one blood transfusion during this admission. H/h trended down from 8.3 - 7.9 - 7.6. Repeat CBC reassuring w/ Hgb 8.4. One small site of bleeding seen on endoscopy and clipped by GI.  -Repeat PM CBC  -Type and Screen Acute pulmonary embolism (HCC) Recent hx of small PE. -DVT US  negative, will hold heparin  until 12/17 AM given concern for bleeding on endoscopy.  - Hold eliquis  T2DM (type 2 diabetes mellitus) (HCC) BG in good range last 24 hours.  -Continue 15u LAI daily - Continue SSI moderate  - Hold home metformin  Chronic health problem HTN/CAD/CHF: hold home Coreg , diltiazem .  -Continue home hydralazine  50mg  q8h BPH: Continue home tamsulosin  GERD: Continue home Protonix   FEN/GI: Full liquids PPx: Holding heparin  until 12/17 AM Dispo:pending clinical improvement   Subjective:  Pt tolerated endoscopy well. Endorses a large BM this AM which has made him feel much better. Overall feeling good, discussed his hospital course so far with daughter and wife, need for further hospitalization due to f/u EUS and renal biopsy.  Objective: Temp:  [97.7 F (36.5 C)-98.3 F (36.8 C)] 97.8 F (36.6 C) (12/16 0715) Pulse Rate:  [70-76] 76 (12/16 0715) Resp:  [16-19] 16 (12/16  0715) BP: (121-175)/(55-74) 175/70 (12/16 0715) SpO2:  [98 %-99 %] 98 % (12/16 0715) Physical Exam: General: Awake, alert, NAD. Communicates clearly. Much improved mental  status and clarity.  Cardio: RRR. Normal S1, S2. No murmur, rub, gallop. 2+ radial and dorsalis pedis pulses b/l w/ good capillary refill. Resp: CTA bilaterally. No wheezes, rales, or rhonchi. Normal work of breathing on room air Abdomen: soft, non-tender, non-distended. Much improved. Cholecystostomy tube in place, clean and dry.  Skin: Improving jaundice vs prior  Neuro: AOx4. No focal deficits.   Laboratory: Most recent CBC Lab Results  Component Value Date   WBC 11.4 (H) 11/26/2024   HGB 7.6 (L) 11/26/2024   HCT 21.9 (L) 11/26/2024   MCV 90.5 11/26/2024   PLT 251 11/26/2024   Most recent BMP    Latest Ref Rng & Units 11/26/2024    4:40 AM  BMP  Glucose 70 - 99 mg/dL 869   BUN 8 - 23 mg/dL 75   Creatinine 9.38 - 1.24 mg/dL 6.08   Sodium 864 - 854 mmol/L 137   Potassium 3.5 - 5.1 mmol/L 3.8   Chloride 98 - 111 mmol/L 106   CO2 22 - 32 mmol/L 22   Calcium  8.9 - 10.3 mg/dL 7.9    AST 43 ALT 870 Alk Phos 721 T Bili 1.5 Lipase < 10  Imaging/Diagnostic Tests: Renal US  1. Limited visualization of the kidneys due to overlying bandage. No hydronephrosis.    Manon Jester, DO 11/26/2024, 8:28 AM  PGY-1, [facility] FPTS Intern pager: [phone], text pages welcome Secure chat group [facility] Teaching Service

## 2024-11-26 NOTE — Op Note (Signed)
 [facility] Patient Name: Colin Keller Procedure Date : 11/26/2024 MRN: [id number] Attending MD: Aloha Finner , MD, [id number] Date of Birth: [birth year] CSN: [id number] Age: 68 Admit Type: Inpatient Procedure:                Upper GI endoscopy (intention EUS see below) Indications:              Abnormal CT of the GI tract, Abnormal MRI of the GI                            tract, Rule out malignancy Providers:                Aloha Finner, MD, Clotilda Schmitz, RN, Curtistine Bishop, Technician Referring MD:              Medicines:                Monitored Anesthesia Care Complications:            No immediate complications. Estimated Blood Loss:     Estimated blood loss was minimal. Procedure:                Pre-Anesthesia Assessment:                           - Prior to the procedure, a History and Physical                            was performed, and patient medications and                            allergies were reviewed. The patient's tolerance of                            previous anesthesia was also reviewed. The risks                            and benefits of the procedure and the sedation                            options and risks were discussed with the patient.                            All questions were answered, and informed consent                            was obtained. Prior Anticoagulants: The patient has                            taken heparin , last dose was day of procedure. ASA                            Grade Assessment: III - A patient with severe  systemic disease. After reviewing the risks and                            benefits, the patient was deemed in satisfactory                            condition to undergo the procedure.                           After obtaining informed consent, the endoscope was                            passed under direct vision. Throughout the                             procedure, the patient's blood pressure, pulse, and                            oxygen saturations were monitored continuously. The                            [id number]-H190 ([id number]) Olympus endoscope was introduced                            through the mouth, and advanced to the second part                            of duodenum. The [id number]-[id number] ([id number]) Olympus                            duodenoscope was introduced through the mouth, and                            advanced to the area of papilla. After obtaining                            informed consent, the endoscope was passed under                            direct vision. Throughout the procedure, the                            patient's blood pressure, pulse, and oxygen                            saturations were monitored continuously.The upper                            GI endoscopy was accomplished without difficulty.                            The patient tolerated the procedure. Scope In: Scope Out: Findings:      No gross lesions were noted in the proximal esophagus and in the mid  esophagus.      LA Grade C (one or more mucosal breaks continuous between tops of 2 or       more mucosal folds, less than 75% circumference) esophagitis with no       bleeding was found in the distal esophagus.      A 2 cm hiatal hernia was present.      A large amount of food (residue) was found in the entire examined       stomach. This prevents visualization of the entire stomach.      Food (residue) was found in the duodenal bulb. This was able to be       suctioned.      Localized severe inflammation characterized by congestion (edema),       erythema, friability and granularity was found in the duodenal sweep       (this was not clearly documented on recent ERCP procedure) likely from       recent manipulations to the region.      An acquired benign-appearing, intrinsic moderate stenosis was found in       this area  of inflammation. Able to be traversed with adult endoscope,       but for ERCP scope to pass, needed to have Jagwire to follow (thus a 450       cm 0.035 inch Jagwire was placed to follow it into the 2nd portion of       the duodenum.      A previously placed plastic biliary stent was seen at the major papilla.      One oozing superficial duodenal ulcer with pigmented material was found       in the superior aspect of the area of the papilla from prior       sphincterotomy site. A visible vessel itself was not seen but when water        was placed on the area, there was mild oozing noted. The lesion was 8 mm       in largest dimension. For hemostasis, two hemostatic clips were       successfully placed (MR conditional). There was no bleeding at the end       of the procedure. The biliary stent was still in good position with bile       flow noted. Impression:               - No gross lesions in the proximal esophagus and in                            the mid esophagus.                           - LA Grade C esophagitis with no bleeding found                            distally..                           - 2 cm hiatal hernia.                           - A large amount of food (residue) in the stomach.  This precludes ability to perform EUS.                           - Retained food in the duodenum bulb.                           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 Traversed with EGD                            scope but ERCP scope required wire placement to                            follow (but was able to traverse with gentle                            pressure).                           - Plastic biliary stent at the major papilla.                           - Oozing duodenal ulceration with pigmented spot                            and oozing noted at the area of sphincterotomy                             site. Clips (MR conditional) were placed.                           - EUS not able to be performed due to the                            foodstuffs found at this time. Recommendation:           - The patient will be observed post-procedure,                            until all discharge criteria are met.                           - Return patient to hospital ward for ongoing care.                           - Full liquid diet.                           - PPI BID.                           - Carafate  1 g twice daily.                           -  Trend Hgb/Hct.                           - If possible to hold heparin  would wait at least                            24-48 hours, but if cannot hold due to recent PE,                            then may restart @800PM  without bolus and monitor.                            Consider role of IV Filter, if extended time off                            anticoagulation. (per medicine service).                           - Timing of repeat attempt to perform EUS is TBD at                            this time due to my availability. Will discuss                            further with Inpatient GI how to approach.                           - The findings and recommendations were discussed                            with the patient.                           - The findings and recommendations were discussed                            with the referring physician. Procedure Code(s):        --- Professional ---                           [phone], Esophagogastroduodenoscopy, flexible,                            transoral; with control of bleeding, any method Diagnosis Code(s):        --- Professional ---                           K20.90, Esophagitis, unspecified without bleeding                           K44.9, Diaphragmatic hernia without obstruction or                            gangrene  K29.80, Duodenitis without bleeding                            K31.5, Obstruction of duodenum                           K26.4, Chronic or unspecified duodenal ulcer with                            hemorrhage                           R93.3, Abnormal findings on diagnostic imaging of                            other parts of digestive tract CPT copyright 2022 [employer]. All rights reserved. The codes documented in this report are preliminary and upon coder review may  be revised to meet current compliance requirements. Aloha Finner, MD 11/26/2024 8:54:11 AM Number of Addenda: 0

## 2024-11-26 NOTE — Assessment & Plan Note (Addendum)
 BG in good range last 24 hours.  -Continue 15u LAI daily - Continue SSI moderate  - Hold home metformin 

## 2024-11-26 NOTE — Assessment & Plan Note (Addendum)
 AKI persistent but stable. UOP reassuring, ~1781mL last 24 hours.  -Nephro consulted, appreciate their recommendations Renal US  WNL Plan for renal biopsy (tentatively 12/22) after aspirin  wash out x 5 days (starting 12/16) Foley in place to monitor UOP Lab workup from Nephro includes the following: C3, C4, Anti DNA antibody, ANCA, hepatitis panel, ANA w/ reflex, kappa/lambda light chains, protein electrophoresis, microalbumin/Cr ratio, protein/Cr ratio - D/c'd IVF - holding home lasix , hydrochlorothiazide  - AM CMP

## 2024-11-26 NOTE — Anesthesia Postprocedure Evaluation (Signed)
 Anesthesia Post Note  Patient: Colin Keller  Procedure(s) Performed: EGD (ESOPHAGOGASTRODUODENOSCOPY) CONTROL OF HEMORRHAGE, GI TRACT, ENDOSCOPIC, BY CLIPPING OR OVERSEWING     Patient location during evaluation: PACU Anesthesia Type: MAC Level of consciousness: awake and alert Pain management: pain level controlled Vital Signs Assessment: post-procedure vital signs reviewed and stable Respiratory status: spontaneous breathing, nonlabored ventilation, respiratory function stable and patient connected to nasal cannula oxygen Cardiovascular status: blood pressure returned to baseline and stable Postop Assessment: no apparent nausea or vomiting Anesthetic complications: no   No notable events documented.  Last Vitals:  Vitals:   11/26/24 0904 11/26/24 0925  BP: (!) 175/72 (!) 174/72  Pulse: 71 71  Resp: 14 18  Temp:  36.4 C  SpO2: 97% 99%    Last Pain:  Vitals:   11/26/24 0930  TempSrc:   PainSc: 0-No pain                 [name] MARLA Cornea

## 2024-11-26 NOTE — Assessment & Plan Note (Addendum)
 Recent hx of small PE. -DVT US  negative, will hold heparin  until 12/17 AM given concern for bleeding on endoscopy.  - Hold eliquis 

## 2024-11-26 NOTE — Progress Notes (Addendum)
 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 Rayburn OP. Has baseline nephrotic range proteinuria. Renal ultrasound without obstruction. Urine sediment relatively bland -AKI presumed to be pre-renal injury and possibly zosyn  related AKI. Zosyn  stopped. Also concern for peri-infectious GN -can hold fluids for now given that he is able to take PO, encouraged PO hydration. Low threshold to restart fluids if Cr is uptrending -Cr stable today: 3.9. Nonoliguric -his nephrotic syndrome is likely being driven by uncontrolled DM but I believe we should move forward with a renal biopsy while he's here. Last ASA dose 12/15--plan for biopsy next Monday with IR (req form in chart). Checking serologies in the interim which are pending. Thus far, acute hep panel neg, PLA2R WNL, C3&4 WNL -no indications to start renal replacement therapy -Avoid nephrotoxic medications including NSAIDs and iodinated intravenous contrast exposure unless the latter is absolutely indicated.  Preferred narcotic agents for pain control are hydromorphone , fentanyl , and methadone. Morphine  should not be used. Avoid Baclofen  and avoid oral sodium phosphate and magnesium  citrate based laxatives / bowel preps. Continue strict Input and Output monitoring. Will monitor the patient closely with you and intervene or adjust therapy as indicated by changes in clinical status/labs   Biliary obstruction Elevated LFTs-improving -s/p ERCP with stent, s/p chole tube w/ IR -GI and gen surg following -zosyn  switched to rocephin  and flagyl  -s/p EGD today, unable to perform EUS given food in stomach  HTN -hold home lasix , can titrate hydralazine  if needed  Uncontrolled DM2 with hyperglycemia -mgmt per primary service  PE -per primary service, ?heparin  gtt? -suspecting this is secondary to nephrotic syndrome?  Discussed with patient's  wife.  Subjective:   Patient seen and examined bedside. UOP ~1.3L. S/p EUS attempt today. Wife on patient's speakerphone. He reports feeling well. Has been on a liquid diet which he is tolerating. Denies any chest pain, SOB, abd pain.   Objective:   BP (!) 174/72   Pulse 71   Temp 97.6 F (36.4 C)   Resp 18   Ht 5' 10 (1.778 m)   Wt 67.5 kg   SpO2 99%   BMI 21.35 kg/m   Intake/Output Summary (Last 24 hours) at 11/26/2024 1122 Last data filed at 11/26/2024 1020 Gross per 24 hour  Intake 1331.49 ml  Output 1695 ml  Net -363.51 ml   Weight change:   Physical Exam: Gen: NAD, sitting up in bed CVS: RRR Resp: CTA BL Abd: soft, nt/nd, +chole drain Ext: no edema Neuro: awake/alert, following commands  Imaging: US  RENAL Result Date: 11/25/2024 EXAM: US  Retroperitoneum Complete, Renal. 11/25/2024 03:04:13 PM TECHNIQUE: Real-time ultrasonography of the retroperitoneum renal was performed. COMPARISON: US  Renal 01/22/2020. CLINICAL HISTORY: Acute renal failure superimposed on stage 3 chronic kidney disease, unspecified acute renal failure type, unspecified whether stage 3a or 3b CKD. FINDINGS: LIMITATIONS: There is limited visualization of the kidney secondary to bandage. RIGHT KIDNEY/URETER: Right kidney measures 9.2 x 4.9 x 4.8 cm. Normal cortical echogenicity. No hydronephrosis. No calculus. No mass. LEFT KIDNEY/URETER: Left kidney measures 9.5 x 5.9 x 5.1 cm. Normal cortical echogenicity. No hydronephrosis. No calculus. No mass. BLADDER: Foley catheter decompresses the bladder. IMPRESSION: 1. Limited visualization of the kidneys due to overlying bandage. No hydronephrosis. Electronically signed by: Greig Pique MD 11/25/2024 08:01 PM EST RP Workstation: [id number]   IR Perc Cholecystostomy Result Date: 11/25/2024 INDICATION: 68 year old male with concern for acute cholecystitis. EXAM: Fluoroscopic and  ultrasound-guided percutaneous cholecystostomy tube placement MEDICATIONS: The  patient was currently receiving intravenous antibiotics as an inpatient, no additional antibiotics were administered. ANESTHESIA/SEDATION: Moderate (conscious) sedation was employed during this procedure. A total of Versed  2 mg and Fentanyl  100 mcg was administered intravenously. Moderate Sedation Time: 6 minutes. The patient's level of consciousness and vital signs were monitored continuously by radiology nursing throughout the procedure under my direct supervision. FLUOROSCOPY TIME:  One mGy reference air kerma COMPLICATIONS: None immediate. PROCEDURE: Informed written consent was obtained from the patient after a thorough discussion of the procedural risks, benefits and alternatives. All questions were addressed. Maximal Sterile Barrier Technique was utilized including caps, mask, sterile gowns, sterile gloves, sterile drape, hand hygiene and skin antiseptic. A timeout was performed prior to the initiation of the procedure. The patient was placed supine on the angiographic table. The patient's right upper quadrant was then prepped and draped in normal sterile fashion with maximum sterile barrier. Ultrasound demonstrates a distended gallbladder. Subdermal Local anesthesia was provided at the planned skin entry site. Under ultrasound guidance, deeper local anesthetic was provided through intercostal muscles and along the liver capsule. Ultrasound was used to puncture the gallbladder using a 18 gauge trocar needle via a subcostal, transabdominal approach. A 0.035 inch exchange wire was placed in the tract was dilated. A 10 French multipurpose drainage catheter was advanced into the gallbladder lumen. Approximately 100 cc of bilious aspirate was a vacuo aided. Contrast injection demonstrated nondistended gallbladder without visualization of the cystic duct. The drain was then secured in place using a 0-silk suture and a Stayfix device. A sterile dressing was applied. The tube was placed to bulb suction. A culture was  sent to the lab for analysis. The patient tolerated procedure well without evidence of immediate complication was transferred back to the floor in stable condition. IMPRESSION: Successful placement of percutaneous, transabdominal 10 French cholecystostomy tube. Ester Sides, MD [facility] Radiology Electronically Signed   By: Ester Sides M.D.   On: 11/25/2024 07:27    Labs: BMET Recent Labs  Lab 11/22/24 0344 11/22/24 1221 11/22/24 1505 11/22/24 1735 11/23/24 0208 11/24/24 0130 11/25/24 0414 11/26/24 0440  NA 134* 133* 136 136 135 132* 132* 137  K 3.7 4.2 3.8 3.7 3.7 3.5 4.1 3.8  CL 99 97* 99 101 100 94* 101 106  CO2 25 22  --  22 23 23  17* 22  GLUCOSE 154* 312* 299* 237* 194* 214* 296* 130*  BUN 41* 50* 48* 54* 61* 74* 77* 75*  CREATININE 2.95* 3.27* 3.30* 3.43* 3.73* 4.09* 3.98* 3.91*  CALCIUM  8.4* 8.1*  --  7.9* 8.2* 8.0* 8.0* 7.9*   CBC Recent Labs  Lab 11/23/24 0208 11/24/24 0130 11/25/24 0414 11/26/24 0440  WBC 16.7* 18.8* 14.0* 11.4*  NEUTROABS  --   --  11.7*  --   HGB 7.5* 8.3* 7.9* 7.6*  HCT 21.6* 23.4* 23.2* 21.9*  MCV 90.8 89.7 91.0 90.5  PLT 264 276 247 251    Medications:     Chlorhexidine  Gluconate Cloth  6 each Topical Daily   feeding supplement  1 Container Oral TID BM   hydrALAZINE   50 mg Oral Q8H   insulin  aspart  0-15 Units Subcutaneous Q4H   insulin  glargine  15 Units Subcutaneous Daily   isosorbide  mononitrate  60 mg Oral Daily   lidocaine   3 patch Transdermal Q24H   metroNIDAZOLE   500 mg Oral Q12H   pantoprazole   40 mg Oral Daily  polyethylene glycol  17 g Oral Daily   senna  1 tablet Oral Daily   sodium chloride  flush  5 mL Intracatheter Q8H   tamsulosin   0.4 mg Oral Daily      Ephriam Stank, MD [facility] 11/26/2024, 11:22 AM

## 2024-11-26 NOTE — Transfer of Care (Signed)
 Immediate Anesthesia Transfer of Care Note  Patient: Colin Keller  Procedure(s) Performed: EGD (ESOPHAGOGASTRODUODENOSCOPY) CONTROL OF HEMORRHAGE, GI TRACT, ENDOSCOPIC, BY CLIPPING OR OVERSEWING  Patient Location: PACU and Endoscopy Unit  Anesthesia Type:MAC  Level of Consciousness: sedated and responds to stimulation  Airway & Oxygen Therapy: Patient Spontanous Breathing and Patient connected to face mask oxygen  Post-op Assessment: Report given to RN and Post -op Vital signs reviewed and stable  Post vital signs: Reviewed and stable  Last Vitals:  Vitals Value Taken Time  BP 161/69 11/26/24 08:44  Temp    Pulse 71 11/26/24 08:47  Resp 17 11/26/24 08:47  SpO2 98 % 11/26/24 08:47  Vitals shown include unfiled device data.  Last Pain:  Vitals:   11/26/24 0715  TempSrc: Temporal  PainSc: 7       Patients Stated Pain Goal: 5 (11/25/24 0926)  Complications: No notable events documented.

## 2024-11-26 NOTE — Plan of Care (Signed)

## 2024-11-26 NOTE — Assessment & Plan Note (Addendum)
 HTN/CAD/CHF: hold home Coreg , diltiazem .  -Continue home hydralazine  50mg  q8h BPH: Continue home tamsulosin  GERD: Continue home Protonix 

## 2024-11-26 NOTE — Plan of Care (Addendum)
 Spoke with Dr. Albertus,  GI regarding course of antibiotics s/p percutaneous cholecystomy tube placement on 12/14. Per Dr. Albertus, will plan to give antibiotics for 5 days after tube placement assuming he does well. Antibiotics to end 12/18

## 2024-11-26 NOTE — Progress Notes (Signed)
 PHARMACY - ANTICOAGULATION CONSULT NOTE  Pharmacy Consult for IV heparin  Indication: pulmonary embolus  Allergies[1]  Patient Measurements: Height: 5' 10 (177.8 cm) Weight: 67.5 kg (148 lb 13 oz) IBW/kg (Calculated) : 73  Vital Signs: Temp: 97.8 F (36.6 C) (12/16 0715) Temp Source: Temporal (12/16 0715) BP: 175/70 (12/16 0715) Pulse Rate: 76 (12/16 0715)  Labs: Recent Labs    11/24/24 0130 11/25/24 0414 11/26/24 0440  HGB 8.3* 7.9* 7.6*  HCT 23.4* 23.2* 21.9*  PLT 276 247 251  APTT  --  66* 38*  HEPARINUNFRC  --  0.44 0.38  CREATININE 4.09* 3.98* 3.91*    Estimated Creatinine Clearance: 16.8 mL/min (A) (by C-G formula based on SCr of 3.91 mg/dL (H)).   Medical History: Past Medical History:  Diagnosis Date   Acute diastolic heart failure (HCC) 08/18/2018   AKI (acute kidney injury) 10/04/2023   Atherosclerosis of both carotid arteries 08/18/2020   Carotid dopplers 08/2019 40-59% bilateral stenosis and right subclavian stenosis   Bilateral carotid artery stenosis 08/18/2020   Right Carotid: Velocities in the right ICA are consistent with a 60-79%                 stenosis. Non-hemodynamically significant plaque <50% noted in                 the CCA. The ECA appears >50% stenosed. Proximal subclavian                 artery dilatation with elevated and turbulent flow just past the                 narrowing, measuring 1.6 cm.   Left Carotid: Velocities in the left ICA are    CHF (congestive heart failure) (HCC)    Chronic congestive heart failure (HCC) 10/04/2023   Transthoracic echocardiogram 10/08/2018: LVEF is approximately 50% with hypokinesis of the lateral (base/mid) and basal inferior walls T     Chronic left shoulder pain 03/18/2021   Chronic low back pain without sciatica 10/16/2008   H/o chronic LBP with h/o lumbar disc herniation and intermittent radicular pain  Chronic pain, on stable doses of Norco 10/325 TID. MRI in 2003 showing SMALL CENTRAL DISC  HERNIATION AT L4-5.  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      CKD (chronic kidney disease), stage III (HCC)    Coronary artery disease    Coronary artery disease involving native heart with angina pectoris, unspecified vessel or lesion type 08/10/2023   COVID 10/04/2023   Diabetes mellitus    DKA (diabetic ketoacidosis) (HCC) 10/03/2023   Dyspnea    Fall 10/04/2023   GERD (gastroesophageal reflux disease)    Hx of CABG 08/23/2018   LIMA to LAD, RIMA to RCA, SVG to OM3, EVH via right thigh   Hyperlipidemia    Hypertension    Hypertension associated with diabetes (HCC) 12/22/2008   Qualifier: Diagnosis of  By: Edrick MD, Susan     Hypertensive nephropathy 08/18/2020   Insulin  dependent type 2 diabetes mellitus (HCC) 12/22/2020   Left ventricular dysfunction 08/18/2020   TTE ([id number]): LVEF is approximately 50% with hypokinesis of the lateral (base/mid) and basal inferior walls The cavity size was normal.      Long term (current) use of antithrombotics/antiplatelets 09/21/2022   Planned 60-months DAPT for DES for PAD on 09/21/2022.  Planned 6 months therapy. End 03/23/23.   Peripheral artery disease 08/31/2021   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 year old male admitted on 11/19/2024 with concern for cholecystitis s/p ERCP w/ stenting on 12/12 and percutaneous cholecystostomy by IR on 12/14. Recently diagnosed with nonocclusive small PE (11/15/24) and started on eliquis . Last dose eliquis  prior to admission 12/8 @ 9:30 pm. Pharmacy consulted to dose heparin  (no bolus d/t required pRBC transfusion, last given 12/12). Will trend aPTT until correlating with anti-xa levels with the presence of DOAC prior. Heparin  previously therapeutic on 1050 units/hr.  Heparin  level 0.38 is not correlating with aPTT 38, which is subtherapeutic as expected after holding heparin  for EUS. Levels drawn after stopping heparin . Planned for renal biopsy 12/22. Will need to hold heparin  4 hours before and can restart 2 hours after per IR.   Goal of Therapy:  Heparin  level 0.3-0.7 units/ml aPTT 66-102 seconds Monitor platelets by anticoagulation protocol: Yes   Plan:  F/u restart Heparin  infusion at 1050 units/hr after EUS  F/u aPTT until correlates with heparin  level  Daily aPTT, heparin  level, CBC, and monitoring for bleeding F/u restart apixaban    Thank you for allowing pharmacy to participate in this patient's care.  Jinnie Door, PharmD, BCPS, BCCP Clinical Pharmacist  Please check AMION for all [facility] Pharmacy phone numbers After 10:00 PM, call Main Pharmacy [phone]     [1]  Allergies Allergen Reactions   Amlodipine  Other (See Comments)    Leg swelling   Empagliflozin  Other (See Comments)    Euglycemic DKA   Amitriptyline  Other (See Comments)    Urinary retention   Lisinopril  Other (See Comments)    Hyperkalemia    Nortriptyline  Hcl Other (See Comments)    Vivid / bad dreams   Statins     Aches in Joints    Simvastatin  Other (See Comments)    Arthralgias

## 2024-11-26 NOTE — Assessment & Plan Note (Signed)
 Pt is s/p one blood transfusion during this admission. H/h trended down from 8.3 - 7.9 - 7.6. Repeat CBC reassuring w/ Hgb 8.4. One small site of bleeding seen on endoscopy and clipped by GI.  -Repeat PM CBC  -Type and Screen

## 2024-11-27 ENCOUNTER — Inpatient Hospital Stay (HOSPITAL_COMMUNITY)

## 2024-11-27 ENCOUNTER — Ambulatory Visit: Admitting: Pharmacist

## 2024-11-27 DIAGNOSIS — I10 Essential (primary) hypertension: Secondary | ICD-10-CM

## 2024-11-27 DIAGNOSIS — I4891 Unspecified atrial fibrillation: Secondary | ICD-10-CM | POA: Diagnosis not present

## 2024-11-27 DIAGNOSIS — I2510 Atherosclerotic heart disease of native coronary artery without angina pectoris: Secondary | ICD-10-CM | POA: Diagnosis not present

## 2024-11-27 DIAGNOSIS — K819 Cholecystitis, unspecified: Secondary | ICD-10-CM

## 2024-11-27 LAB — COMPREHENSIVE METABOLIC PANEL WITH GFR
ALT: 113 U/L — ABNORMAL HIGH (ref 0–44)
AST: 25 U/L (ref 15–41)
Albumin: 2.3 g/dL — ABNORMAL LOW (ref 3.5–5.0)
Alkaline Phosphatase: 786 U/L — ABNORMAL HIGH (ref 38–126)
Anion gap: 11 (ref 5–15)
BUN: 66 mg/dL — ABNORMAL HIGH (ref 8–23)
CO2: 23 mmol/L (ref 22–32)
Calcium: 8.5 mg/dL — ABNORMAL LOW (ref 8.9–10.3)
Chloride: 106 mmol/L (ref 98–111)
Creatinine, Ser: 3.38 mg/dL — ABNORMAL HIGH (ref 0.61–1.24)
GFR, Estimated: 19 mL/min — ABNORMAL LOW (ref >60)
Glucose, Bld: 120 mg/dL — ABNORMAL HIGH (ref 70–99)
Potassium: 4.3 mmol/L (ref 3.5–5.1)
Sodium: 139 mmol/L (ref 135–145)
Total Bilirubin: 0.8 mg/dL (ref 0.0–1.2)
Total Protein: 5.3 g/dL — ABNORMAL LOW (ref 6.5–8.1)

## 2024-11-27 LAB — CBC
HCT: 23.5 % — ABNORMAL LOW (ref 39.0–52.0)
HCT: 28.2 % — ABNORMAL LOW (ref 39.0–52.0)
HCT: 28.3 % — ABNORMAL LOW (ref 39.0–52.0)
Hemoglobin: 7.8 g/dL — ABNORMAL LOW (ref 13.0–17.0)
Hemoglobin: 9.6 g/dL — ABNORMAL LOW (ref 13.0–17.0)
Hemoglobin: 9.6 g/dL — ABNORMAL LOW (ref 13.0–17.0)
MCH: 31 pg (ref 26.0–34.0)
MCH: 31.5 pg (ref 26.0–34.0)
MCH: 31.3 pg (ref 26.0–34.0)
MCHC: 33.2 g/dL (ref 30.0–36.0)
MCHC: 34 g/dL (ref 30.0–36.0)
MCHC: 33.9 g/dL (ref 30.0–36.0)
MCV: 93.3 fL (ref 80.0–100.0)
MCV: 92.5 fL (ref 80.0–100.0)
MCV: 92.2 fL (ref 80.0–100.0)
Platelets: 288 K/uL (ref 150–400)
Platelets: 319 K/uL (ref 150–400)
Platelets: 316 K/uL (ref 150–400)
RBC: 2.52 MIL/uL — ABNORMAL LOW (ref 4.22–5.81)
RBC: 3.05 MIL/uL — ABNORMAL LOW (ref 4.22–5.81)
RBC: 3.07 MIL/uL — ABNORMAL LOW (ref 4.22–5.81)
RDW: 15.2 % (ref 11.5–15.5)
RDW: 15.3 % (ref 11.5–15.5)
RDW: 15.5 % (ref 11.5–15.5)
WBC: 11.9 K/uL — ABNORMAL HIGH (ref 4.0–10.5)
WBC: 13.3 K/uL — ABNORMAL HIGH (ref 4.0–10.5)
WBC: 12.6 K/uL — ABNORMAL HIGH (ref 4.0–10.5)
nRBC: 0 % (ref 0.0–0.2)
nRBC: 0 % (ref 0.0–0.2)
nRBC: 0 % (ref 0.0–0.2)

## 2024-11-27 LAB — ECHOCARDIOGRAM LIMITED
Height: 70 in
S' Lateral: 3.3 cm
Weight: 2380.97 [oz_av]

## 2024-11-27 LAB — ANCA PROFILE
Anti-MPO Antibodies: <0.2 U (ref 0.0–0.9)
Anti-PR3 Antibodies: <0.2 U (ref 0.0–0.9)
Atypical P-ANCA titer: <1:20 {titer}
C-ANCA: <1:20 {titer}
P-ANCA: <1:20 {titer}

## 2024-11-27 LAB — GLUCOSE, CAPILLARY
Glucose-Capillary: 142 mg/dL — ABNORMAL HIGH (ref 70–99)
Glucose-Capillary: 179 mg/dL — ABNORMAL HIGH (ref 70–99)
Glucose-Capillary: 230 mg/dL — ABNORMAL HIGH (ref 70–99)
Glucose-Capillary: 235 mg/dL — ABNORMAL HIGH (ref 70–99)
Glucose-Capillary: 284 mg/dL — ABNORMAL HIGH (ref 70–99)
Glucose-Capillary: 285 mg/dL — ABNORMAL HIGH (ref 70–99)

## 2024-11-27 LAB — FERRITIN: Ferritin: 217 ng/mL (ref 24–336)

## 2024-11-27 LAB — PREPARE RBC (CROSSMATCH)

## 2024-11-27 LAB — APTT: aPTT: 38 s — ABNORMAL HIGH (ref 24–36)

## 2024-11-27 LAB — HEPARIN LEVEL (UNFRACTIONATED): Heparin Unfractionated: 0.14 [IU]/mL — ABNORMAL LOW (ref 0.30–0.70)

## 2024-11-27 LAB — TROPONIN T, HIGH SENSITIVITY
Troponin T High Sensitivity: 45 ng/L — ABNORMAL HIGH (ref 0–19)
Troponin T High Sensitivity: 46 ng/L — ABNORMAL HIGH (ref 0–19)

## 2024-11-27 LAB — TSH: TSH: 2.48 u[IU]/mL (ref 0.350–4.500)

## 2024-11-27 MED ORDER — CARVEDILOL 6.25 MG PO TABS
6.2500 mg | ORAL_TABLET | Freq: Two times a day (BID) | ORAL | Status: DC
  Administered 2024-11-27: 12:00:00 6.25 mg via ORAL
  Filled 2024-11-27: qty 1

## 2024-11-27 MED ORDER — SODIUM CHLORIDE 0.9% IV SOLUTION: Freq: Once | INTRAVENOUS | Status: AC

## 2024-11-27 MED ORDER — INSULIN GLARGINE 100 UNIT/ML ~~LOC~~ SOLN
18.0000 [IU] | Freq: Every day | SUBCUTANEOUS | Status: DC
  Filled 2024-11-27: qty 0.18

## 2024-11-27 MED ORDER — METOPROLOL TARTRATE 5 MG/5ML IV SOLN
5.0000 mg | Freq: Once | INTRAVENOUS | Status: AC
  Administered 2024-11-27: 14:00:00 5 mg via INTRAVENOUS
  Filled 2024-11-27: qty 5

## 2024-11-27 MED ORDER — DILTIAZEM HCL ER COATED BEADS 120 MG PO CP24: 120.0000 mg | ORAL_CAPSULE | Freq: Every day | ORAL | Status: DC

## 2024-11-27 MED ORDER — ADULT MULTIVITAMIN W/MINERALS CH
1.0000 | ORAL_TABLET | Freq: Every day | ORAL | Status: DC
  Administered 2024-11-27 – 2024-12-06 (×9): 1 via ORAL
  Filled 2024-11-27 (×9): qty 1

## 2024-11-27 MED ORDER — DILTIAZEM HCL 25 MG/5ML IV SOLN
20.0000 mg | Freq: Once | INTRAVENOUS | Status: DC
  Filled 2024-11-27: qty 5

## 2024-11-27 MED ORDER — DILTIAZEM HCL ER COATED BEADS 120 MG PO CP24
120.0000 mg | ORAL_CAPSULE | Freq: Every day | ORAL | Status: DC
  Administered 2024-11-27 – 2024-12-01 (×5): 120 mg via ORAL
  Filled 2024-11-27 (×6): qty 1

## 2024-11-27 MED ORDER — INSULIN ASPART 100 UNIT/ML IJ SOLN
0.0000 [IU] | Freq: Three times a day (TID) | INTRAMUSCULAR | Status: DC
  Administered 2024-11-27 – 2024-11-28 (×3): 8 [IU] via SUBCUTANEOUS
  Filled 2024-11-27 (×2): qty 8
  Filled 2024-11-27: qty 11
  Filled 2024-11-27: qty 8

## 2024-11-27 MED ORDER — METOPROLOL TARTRATE 50 MG PO TABS: 50.0000 mg | ORAL_TABLET | Freq: Two times a day (BID) | ORAL | Status: DC

## 2024-11-27 MED ORDER — METOPROLOL TARTRATE 50 MG PO TABS
50.0000 mg | ORAL_TABLET | Freq: Two times a day (BID) | ORAL | Status: DC
  Administered 2024-11-27 – 2024-11-29 (×5): 50 mg via ORAL
  Filled 2024-11-27 (×5): qty 1

## 2024-11-27 MED ORDER — DILTIAZEM HCL-DEXTROSE 125-5 MG/125ML-% IV SOLN (PREMIX)
5.0000 mg/h | INTRAVENOUS | Status: DC
  Filled 2024-11-27: qty 125

## 2024-11-27 MED ORDER — HEPARIN (PORCINE) 25000 UT/250ML-% IV SOLN
1050.0000 [IU]/h | INTRAVENOUS | Status: AC
  Administered 2024-11-27: 17:00:00 1050 [IU]/h via INTRAVENOUS
  Filled 2024-11-27: qty 250

## 2024-11-27 NOTE — Progress Notes (Signed)
 Referring Provider(s): Dr. Suzann Daring, MD  Supervising Physician: Philip Cornet  Patient Status:  [facility] - In-pt  Chief Complaint: Biliary obstruction s/p cholecystostomy tube 12/14 by Dr. Jennefer   Brief History:  Patient presented to ED on 12/9 with complaints of several weeks of RUQ pain. CT abd/pelvis w/o contrast demonstrated biliary duct dilation and gall bladder features concerning for acute cholecystitis. He was admitted and underwent MRCP which noted diffuse biliary ductal dilatation and distal CBD stricture without evidence of choledocholithiasis. HIDA scan was non diagnostic. He underwent ERCP with GI on 12/12 which showed distal biliary stricture 3 cm on unclear etiology, plastic stent was placed. He subsequently developed worsening leukocytosis and RUQ pain with nausea and vomiting. He is nota candidate for cholecystectomy. IR was requested for a perc chole which placed by Dr. Jennefer on 12/14. Patient is also currently anticipated for 12/22 non-targeted renal bx in IR, scheduled out due to ASA washout requirement.  Subjective:  Patient alert and laying in bed, calm. His wife is at the bedside Currently without any significant complaints. He is not bothered by his drain, but notes it is tender at times. Patient denies any fevers, headache, chest pain, SOB, cough, nausea, vomiting or bleeding.   Allergies: Amlodipine , Empagliflozin , Amitriptyline , Lisinopril , Nortriptyline  hcl, Statins, and Simvastatin   Medications: Prior to Admission medications  Medication Sig Start Date End Date Taking? Authorizing Provider  APIXABAN  (ELIQUIS ) VTE STARTER PACK (10MG  AND 5MG ) Take as directed on package: start with two-5mg  tablets twice daily for 7 days. On day 8, switch to one-5mg  tablet twice daily. 11/16/24  Yes Gherghe, Costin M, MD  aspirin  81 MG tablet Take 81 mg by mouth daily.   Yes [provider]  carvedilol  (COREG ) 6.25 MG tablet Take 1 tablet (6.25 mg total) by mouth 2  (two) times daily with a meal. 11/17/24  Yes Elicia Hamlet, MD  cetirizine (ZYRTEC) 10 MG chewable tablet Chew 10 mg by mouth as needed for allergies.   Yes [provider]  diltiazem  (CARDIZEM  CD) 180 MG 24 hr capsule Take 180 mg by mouth daily. 10/29/24  Yes [provider]  Evolocumab  (REPATHA  SURECLICK) 140 MG/ML SOAJ Inject 140 mg into the skin every 14 (fourteen) days. 03/12/24  Yes Darron Deatrice LABOR, MD  furosemide  (LASIX ) 40 MG tablet Take 0.5 tablets (20 mg total) by mouth daily. 11/18/24  Yes Elicia Hamlet, MD  Glucagon  (BAQSIMI  TWO PACK) 3 MG/DOSE POWD Use as directed PRN low glucose 10/30/24  Yes McDiarmid, Krystal BIRCH, MD  hydrALAZINE  (APRESOLINE ) 50 MG tablet Take 1 tablet (50 mg total) by mouth 3 (three) times daily. 08/22/24 08/17/25 Yes McDiarmid, Krystal BIRCH, MD  hydrochlorothiazide  (HYDRODIURIL ) 12.5 MG tablet Take 1 tablet (12.5 mg total) by mouth daily. 10/30/24  Yes McDiarmid, Krystal BIRCH, MD  HYDROcodone -acetaminophen  (NORCO) 10-325 MG tablet TAKE 1 TABLET BY MOUTH TWICE DAILY AS NEEDED Patient taking differently: Take 1 tablet by mouth in the morning and at bedtime. 11/06/24  Yes McDiarmid, Krystal BIRCH, MD  Insulin  Glargine (BASAGLAR  KWIKPEN) 100 UNIT/ML DIAL AND INJECT 20 UNITS UNDER THE SKIN DAILY. Patient taking differently: Inject 10-12 Units into the skin daily. 08/19/24  Yes McDiarmid, Krystal BIRCH, MD  insulin  lispro (HUMALOG ) 100 UNIT/ML injection Inject 0.03-0.05 mLs (3-5 Units total) into the skin 3 (three) times daily with meals. 3-4 units prior to breakfast, 4-5 units prior to evening meal 08/08/24 02/04/25 Yes McDiarmid, Krystal BIRCH, MD  metFORMIN  (GLUCOPHAGE ) 500 MG tablet Take 2 tablets (1,000 mg  total) by mouth 2 (two) times daily with a meal. TAKE 2 TABLETS BY MOUTH TWICE DAILY WITH A MEAL 10/23/23 11/15/25 Yes McDiarmid, Krystal BIRCH, MD  pantoprazole  (PROTONIX ) 40 MG tablet Take 1 tablet (40 mg total) by mouth daily. 11/18/24  Yes Elicia Hamlet, MD  promethazine  (PHENERGAN ) 25 MG tablet Take 1  tablet (25 mg total) by mouth every 6 (six) hours as needed for nausea or vomiting. Patient taking differently: Take 12.5-25 mg by mouth every 6 (six) hours as needed for nausea or vomiting. 09/24/24  Yes McDiarmid, Krystal BIRCH, MD  tamsulosin  (FLOMAX ) 0.4 MG CAPS capsule Take 1 capsule (0.4 mg total) by mouth daily. 02/20/24  Yes McDiarmid, Krystal BIRCH, MD  apixaban  (ELIQUIS ) 5 MG TABS tablet Take 1 tablet (5 mg total) by mouth 2 (two) times daily. To begin after completion of Eliquis  (apixaban ) VTE starter pack Patient not taking: Reported on 11/19/2024 12/17/24   Trixie Nilda HERO, MD     Vital Signs: BP 134/89 (BP Location: Left Arm)   Pulse 87   Temp 98.2 F (36.8 C) (Oral)   Resp 16   Ht 5' 10 (1.778 m)   Wt 148 lb 13 oz (67.5 kg)   SpO2 100%   BMI 21.35 kg/m   Physical Exam Constitutional:      Appearance: Normal appearance.  Cardiovascular:     Rate and Rhythm: Normal rate.  Pulmonary:     Effort: Pulmonary effort is normal.  Abdominal:     General: Abdomen is flat. There is no distension.     Tenderness: There is abdominal tenderness.     Comments: RUQ drain appropriately dressed. Dressing is soiled with dried blood.  Drain incision site non-tender, without evidence of infection. Retaining suture and Stat Lock in place.  Bloody bilious output in collection bulb. Line flushes well per RN report.   Musculoskeletal:        General: Normal range of motion.  Skin:    General: Skin is warm and dry.  Neurological:     Mental Status: He is alert and oriented to person, place, and time.    Labs:  CBC: Recent Labs    11/26/24 1355 11/26/24 2216 11/27/24 0406 11/27/24 1421  WBC 13.3* 11.6* 11.9* 13.3*  HGB 8.4* 7.9* 7.8* 9.6*  HCT 24.8* 23.9* 23.5* 28.2*  PLT 284 274 288 319    COAGS: Recent Labs    11/23/24 0208 11/25/24 0414 11/26/24 0440 11/27/24 0406  APTT 38* 66* 38* 38*    BMP: Recent Labs    11/24/24 0130 11/25/24 0414 11/26/24 0440 11/27/24 0406  NA  132* 132* 137 139  K 3.5 4.1 3.8 4.3  CL 94* 101 106 106  CO2 23 17* 22 23  GLUCOSE 214* 296* 130* 120*  BUN 74* 77* 75* 66*  CALCIUM  8.0* 8.0* 7.9* 8.5*  CREATININE 4.09* 3.98* 3.91* 3.38*  GFRNONAA 15* 15* 16* 19*    LIVER FUNCTION TESTS: Recent Labs    11/24/24 0822 11/25/24 0414 11/26/24 0440 11/27/24 0406  BILITOT 3.2* 2.7* 1.5* 0.8  AST 347* 168* 43* 25  ALT 211* 199* 129* 113*  ALKPHOS 1,158* 948* 721* 786*  PROT 6.0* 5.1* 5.1* 5.3*  ALBUMIN  1.9* 1.6* 1.6* 2.3*    Assessment and Plan:  Drain Location: RUQ Size: Fr size: 10 Fr Date of placement: 11/24/2024  Currently to: Drain collection device: suction bulb 24 hour output:  Output by Drain (mL) 11/25/24 0701 - 11/25/24 1900 11/25/24 1901 - 11/26/24 0700  11/26/24 0701 - 11/26/24 1900 11/26/24 1901 - 11/27/24 0700 11/27/24 0701 - 11/27/24 1525  Biliary Tube Cholecystostomy 10 Fr. RUQ 410  120 170     Interval imaging/drain manipulation:  DG ERCP today (11/27/2024).  Current examination: Flushes easily per RN report. Insertion site unremarkable. Suture and stat lock in place. Original dressing still in place, soiled with dried blood.   Plan: Dressing soiled, and replaced. Please replace dressing daily or anytime dressing is soiled. Due to larger volume output daily, JP bulb was exchanged for gravity bag. RN advised. Continue TID flushes with 5 cc NS. Record output Q shift. Call IR APP or on call IR MD if difficulty flushing or sudden change in drain output.  Repeat imaging/possible drain injection once output < 10 mL/QD (excluding flush material). Consideration for drain removal if output is < 10 mL/QD (excluding flush material), pending discussion with the providing surgical service.  Discharge planning: Please contact IR APP or on call IR MD prior to patient d/c to ensure appropriate follow up plans are in place. Patient will need to flush drain QD with 5 cc NS, record output QD, dressing changes every  2-3 days or earlier if soiled.   The following is typical course associated with perc chole drains:  Percutaneous cholecystostomy drain to remain in place at least 6 weeks.  Recommend fluoroscopy with injection of the drain in IR to evaluate for patency of the cystic duct. If the duct is patent and general surgery feels patient is stable for cholecystectomy, the drain would be removed at time of surgery. If the duct is patent and general surgery feels patient is NEVER a candidate for cholecystectomy, drain can be capped for a trial. If symptoms recur, then place to gravity bag again. If trial is successful, discuss possible removal of the drain. If trial in unsuccessful, then patient will need routine exchanges of the  chole tube about every 8-10 weeks. Please call the IR PA at [phone] when patient is about to be discharged and we will arrange the follow up drain injection (ok to leave message).    IR will continue to follow - please call with questions or concerns.    Thank you for this interesting consult.  I greatly enjoyed meeting QUINCY BOY and look forward to participating in their care.   Electronically Signed: Carlin DELENA Griffon, PA-C 11/27/2024, 3:10 PM     I spent a total of 15 Minutes at the the patient's bedside AND on the patient's hospital floor or unit, greater than 50% of which was counseling/coordinating care for RUQ perc chole drain care and follow-up.

## 2024-11-27 NOTE — Progress Notes (Signed)
 PT Cancellation Note  Patient Details Name: ZAKARI BATHE MRN: [id number] DOB: [birth year]   Cancelled Treatment:    Reason Eval/Treat Not Completed: Patient at procedure or test/unavailable. Checked on pt x2 this afternoon and both times with staff present in room. Chart reviewed and noted events of the day. Will check back tomorrow for readiness to participate with PT.    Leita JONETTA Sable 11/27/2024, 3:04 PM  Leita Sable, PT, DPT Acute Rehabilitation Services Secure Chat Preferred Office: [phone]

## 2024-11-27 NOTE — Plan of Care (Signed)
 FMTS Interim Progress Note  S:Patient says he is in pain 8/10 in his right upper quadrant and his back. His back pain is his chronic back pain that he takes oxy for at home. He last took oxycodone  at 10am this morning.  He denies nausea or vomiting.  Denies light headedness or chest pain. Says he was a little short of breath earlier when he got up to use the urinal but has since improved.   O: BP 133/72 (BP Location: Left Arm)   Pulse 99   Temp 98.2 F (36.8 C)   Resp 18   Ht 5' 10 (1.778 m)   Wt 67.5 kg   SpO2 100%   BMI 21.35 kg/m   General: not in acute distress  CV: irregular and tachycardic  Resp: normal work of breathing on room air  Abd: mildly distended, tender to palpation in RUQ, soft   A/P: Afib  Patient continues to be tachycardic but not persistently above 110. Patient remains asymptomatic. S/p IV metoprolol  5 mg, po metoprolol  50 mg, and po diltiazen 120 mg.  - If patient remains with HR > 110 will spot dose with IV metoprolol  5 mg  - Continue to monitor vitals   Biliary stricture I Biliary Sepsis  Patient continues to be afebrile with controlled pain. Plan for repeat EUS tomorrow AM to assess pancreas.  - Will ask nurse to give him his prn oxycodone .  - NPO at midnight.   PE Patient with recent PE. Was on eliquis  at home. On heparin  in the hospital.  - Stop heparin  at midnight for EUS tomorrow.   Anemia  Patient with anemia likely due to blood loss from duodenal ulcer (now clipped) and of chronic disease. S/p 2 units of pRBC.  - 9pm CBC  - Transfusion threshold 8 for CAD.   Nicholas Bar, MD 11/27/2024, 8:46 PM PGY-3, [facility] Service pager [phone]

## 2024-11-27 NOTE — Progress Notes (Addendum)
 Patient ID: Colin Keller, male   DOB: 02/22/1956, 68 y.o.   MRN: [id number]    Progress Note   Subjective   Day #8 CC; acute abdominal pain, elevated LFTs / biliary obstruction, new PE  IV ceftriaxone /metronidazole  Off heparin  Transfused x 1 this am - just completed  Status post ERCP with common bile duct stent/11/22/2024 for biliary stricture no cytology/brushings done  Status post placement of cholecystostomy tube 11/24/2024 for cholecystitis   Abnormal imaging of pancreas multiple tiny cystic foci seen within the pancreatic head at the site of the distal common bile duct stricture could be related to chronic pancreatitis however cannot rule out pancreatic mass definitively no evidence of main pancreatic duct dilation-EUS attempted yesterday but patient had stomach full of food, precluding exam, was also noted to have some oozing from his previous sphincterotomy site which was treated for hemostasis   Labs today-WBC 11.9/hemoglobin 7.8/hematocrit 23.5- Sodium 139/potassium 4.3/BUN 66/creatinine 3.38 T. bili 0.8/alk phos 786/AST 25/ALT 113-improved   Patient sitting up in the chair, he has been a bit hungry on the full liquid diet, no complaints of nausea or vomiting, still has some abdominal discomfort in the upper abdomen  Noted to be tachycardic this morning and nurses just confirm that he is in A-fib with RVR with rate in the 140s-cardiology is to see and he is to be started on a diltiazem  drip    Objective   Vital signs in last 24 hours: Temp:  [97.5 F (36.4 C)-98.1 F (36.7 C)] 97.8 F (36.6 C) (12/17 1045) Pulse Rate:  [70-145] 88 (12/17 1045) Resp:  [18-20] 20 (12/17 1045) BP: (139-176)/(64-121) 139/112 (12/17 1045) SpO2:  [98 %-100 %] 100 % (12/17 1045) Last BM Date : 11/26/24 General:   Older white male in NAD, wife at bedside Heart: Tachy irregular rate and rhythm with S1-S2 Lungs: Respirations even and unlabored, lungs CTA bilaterally Abdomen:  Soft, there  is mild tenderness in the epigastrium hypogastrium right upper quadrant. Normal bowel sounds.  Cholecystostomy tube with bilious drainage Extremities:  Without edema. Neurologic:  Alert and oriented,  grossly normal neurologically. Psych:  Cooperative. Normal mood and affect.  Intake/Output from previous day: 12/16 0701 - 12/17 0700 In: 1315 [P.O.:800; I.V.:400; IV Piggyback:100] Out: 1940 [Urine:1650; Drains:290] Intake/Output this shift: Total I/O In: 120 [P.O.:120] Out: 0   Lab Results: Recent Labs    11/26/24 1355 11/26/24 2216 11/27/24 0406  WBC 13.3* 11.6* 11.9*  HGB 8.4* 7.9* 7.8*  HCT 24.8* 23.9* 23.5*  PLT 284 274 288   BMET Recent Labs    11/25/24 0414 11/26/24 0440 11/27/24 0406  NA 132* 137 139  K 4.1 3.8 4.3  CL 101 106 106  CO2 17* 22 23  GLUCOSE 296* 130* 120*  BUN 77* 75* 66*  CREATININE 3.98* 3.91* 3.38*  CALCIUM  8.0* 7.9* 8.5*   LFT Recent Labs    11/27/24 0406  PROT 5.3*  ALBUMIN  2.3*  AST 25  ALT 113*  ALKPHOS 786*  BILITOT 0.8   PT/INR No results for input(s): LABPROT, INR in the last 72 hours.  Studies/Results: VAS US  LOWER EXTREMITY VENOUS (DVT) Result Date: 11/26/2024  Lower Venous DVT Study Patient Name:  Colin Keller  Date of Exam:   11/26/2024 Medical Rec #: [id number]      Accession #:    [id number] Date of Birth: [birth year]      Patient Gender: M Patient Age:   72 years Exam Location:  [facility] Procedure:  VAS US  LOWER EXTREMITY VENOUS (DVT) Referring Phys: CARINA BROWN --------------------------------------------------------------------------------  Indications: Pulmonary embolism.  Comparison Study: No prior exam. Performing Technologist: Edilia Elden Appl  Examination Guidelines: A complete evaluation includes B-mode imaging, spectral Doppler, color Doppler, and power Doppler as needed of all accessible portions of each vessel. Bilateral testing is considered an integral part of a complete examination.  Limited examinations for reoccurring indications may be performed as noted. The reflux portion of the exam is performed with the patient in reverse Trendelenburg.  +---------+---------------+---------+-----------+----------+--------------+ RIGHT    CompressibilityPhasicitySpontaneityPropertiesThrombus Aging +---------+---------------+---------+-----------+----------+--------------+ CFV      Full           No       Yes                                 +---------+---------------+---------+-----------+----------+--------------+ SFJ      Full           Yes      Yes                                 +---------+---------------+---------+-----------+----------+--------------+ FV Prox  Full                                                        +---------+---------------+---------+-----------+----------+--------------+ FV Mid   Full                                                        +---------+---------------+---------+-----------+----------+--------------+ FV DistalFull                                                        +---------+---------------+---------+-----------+----------+--------------+ PFV      Full                                                        +---------+---------------+---------+-----------+----------+--------------+ POP      Full           Yes      Yes                                 +---------+---------------+---------+-----------+----------+--------------+ PTV      Full                                                        +---------+---------------+---------+-----------+----------+--------------+ PERO     Full                                                        +---------+---------------+---------+-----------+----------+--------------+  Plaque noted in adjacent arteries.  +---------+---------------+---------+-----------+----------+--------------+ LEFT     CompressibilityPhasicitySpontaneityPropertiesThrombus Aging  +---------+---------------+---------+-----------+----------+--------------+ CFV      Full           Yes      Yes                                 +---------+---------------+---------+-----------+----------+--------------+ SFJ      Full           Yes      Yes                                 +---------+---------------+---------+-----------+----------+--------------+ FV Prox  Full                                                        +---------+---------------+---------+-----------+----------+--------------+ FV Mid   Full                                                        +---------+---------------+---------+-----------+----------+--------------+ FV DistalFull                                                        +---------+---------------+---------+-----------+----------+--------------+ PFV      Full                                                        +---------+---------------+---------+-----------+----------+--------------+ POP      Full           Yes      Yes                                 +---------+---------------+---------+-----------+----------+--------------+ PTV      Full                                                        +---------+---------------+---------+-----------+----------+--------------+ PERO     Full                                                        +---------+---------------+---------+-----------+----------+--------------+     Summary: BILATERAL: - No evidence of deep vein thrombosis seen in the lower extremities, bilaterally. -No evidence of popliteal cyst, bilaterally.   *See table(s) above for measurements and observations.    Preliminary    US  RENAL Result Date: 11/25/2024  EXAM: US  Retroperitoneum Complete, Renal. 11/25/2024 03:04:13 PM TECHNIQUE: Real-time ultrasonography of the retroperitoneum renal was performed. COMPARISON: US  Renal 01/22/2020. CLINICAL HISTORY: Acute renal failure superimposed on stage 3  chronic kidney disease, unspecified acute renal failure type, unspecified whether stage 3a or 3b CKD. FINDINGS: LIMITATIONS: There is limited visualization of the kidney secondary to bandage. RIGHT KIDNEY/URETER: Right kidney measures 9.2 x 4.9 x 4.8 cm. Normal cortical echogenicity. No hydronephrosis. No calculus. No mass. LEFT KIDNEY/URETER: Left kidney measures 9.5 x 5.9 x 5.1 cm. Normal cortical echogenicity. No hydronephrosis. No calculus. No mass. BLADDER: Foley catheter decompresses the bladder. IMPRESSION: 1. Limited visualization of the kidneys due to overlying bandage. No hydronephrosis. Electronically signed by: Greig Pique MD 11/25/2024 08:01 PM EST RP Workstation: [id number]       Assessment / Plan:    #60 68 year old white male admitted with abdominal pain, workup consistent with biliary obstruction/sepsis  He is status post ERCP and stent placement to common bile duct for CBD stricture 11/22/2024, no stones seen, unable to obtain brushings Status post cholecystostomy tube placement 11/24/2024 for acute cholecystitis  Continues on IV ceftriaxone   EUS attempted yesterday morning but patient found to have stomach full of food and fluid, unable to complete EUS.  He was noted to have some oozing from the previous sphincterotomy site which was treated for hemostasis.  Patient had been on heparin  which has been held since yesterday.  No evidence of further active bleeding, he did receive 1 unit of packed RBCs this morning  Etiology of the biliary stricture at this time is undetermined, there were some tiny punctate calcifications noted on imaging, may be secondary to underlying chronic pancreatitis but cannot rule out malignancy  #2 new PE-heparin  to be resumed today 3 diabetes mellitus #4 anemia stable continue to follow 5 acute on chronic kidney disease-nephrology following-pending renal biopsy  #6 new onset of A-fib with RVR today-cardiology to see, to be started on diltiazem   drip and will moved to another unit.  Plan; from GI perspective we will plan to keep him n.p.o. in a.m. We are hoping that he will be able to undergo repeat EUS tomorrow afternoon with Dr. Wilhelmenia at [facility].  This will require day trip to [place] long and return postprocedure.  Heparin  will need to be held for at least 4 hours prior to procedure.  Continue IV ceftriaxone /metronidazole   Patient and wife aware that he will need to be reassessed in a.m. and depending on cardiac status, improvement in rate control etc. we will decide if appropriate to proceed with EUS tomorrow or if this will need to be rescheduled.     Principal Problem:   Abdominal pain Active Problems:   T2DM (type 2 diabetes mellitus) (HCC)   Anemia   AKI (acute kidney injury)   Acute pulmonary embolism (HCC)   Chronic health problem   Cholecystitis   Pancreatic lesion   Biliary stricture (HCC)   Abnormal finding on GI tract imaging   Anticoagulated   Elevated liver enzymes   Biliary obstruction (HCC)   Leukocytosis   Biliary sepsis (HCC)   S/P ERCP   Bleeding from sphincterotomy site     LOS: 8 days   Analyah Mcconnon EsterwoodPA-C  11/27/2024, 10:59 AM

## 2024-11-27 NOTE — Assessment & Plan Note (Addendum)
 Pt hypertensive, otherwise VSS. WBCs, LFTs stable, slightly downtrending. Reassuring current clinical picture.  -GI consulted, appreciate their recommendations  -Repeat attempt EUS tentatively scheduled for 12/18 pending availability  -NPO @ MN and stay on full liquids to facilitate gastric emptying  - Pain regimen: Continue oxycodone  5mg  q6 prn - Bowel regimen w/ miralax  bid and senna daily - Zofran  4mg  q6 prn for nausea - Continue CTX and flagyl  until 5 days post-cholecystostomy tube placement (11-25-11/19) - General surgery following  -AM CBC, CMP

## 2024-11-27 NOTE — Progress Notes (Signed)
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 Post -op ERCP 12/12.   OT comments  Pt presented in bed and was agreeable to session. He completed ADLS with set up to supervision but was limited due to HR as went up into 150s and had to have pt go into sitting to complete and take rest. Pt was able to physically complete tasks but cued on pacing self and education provided about modifications due to pt can not feel changes in HR in session. At this time Acute Occupational Therapy to follow.      If plan is discharge home, recommend the following:  A little help with walking and/or transfers;A little help with bathing/dressing/bathroom;Help with stairs or ramp for entrance;Assistance with cooking/housework;Assist for transportation   Equipment Recommendations  None recommended by OT    Recommendations for Other Services      Precautions / Restrictions Precautions Precautions: None Recall of Precautions/Restrictions: Intact Restrictions Weight Bearing Restrictions Per Provider Order: No       Mobility Bed Mobility Overal bed mobility: Modified Independent             General bed mobility comments: HOB elevated    Transfers Overall transfer level: Needs assistance Equipment used: None Transfers: Sit to/from Stand Sit to Stand: Modified independent (Device/Increase time)           General transfer comment: Pt gave RW intially to ensure does not need in but then reported felt fine and did not need extranal support     Balance Overall balance assessment: Needs assistance Sitting-balance support: Feet supported Sitting  balance-Leahy Scale: Good     Standing balance support: No upper extremity supported Standing balance-Leahy Scale: Fair Standing balance comment: noted when completed ADLS while standing has some posterior lean onto heels                           ADL either performed or assessed with clinical judgement   ADL Overall ADL's : Needs assistance/impaired Eating/Feeding: Set up;Sitting (clear liquids)   Grooming: Wash/dry face;Wash/dry hands;Supervision/safety;Set up;Sitting;Standing Grooming Details (indicate cue type and reason): started standing but due to HR required to complete in sitting Upper Body Bathing: Set up;Supervision/ safety;Sitting;Standing Upper Body Bathing Details (indicate cue type and reason): required to complete in sitting due to HR Lower Body Bathing: Supervison/ safety;Sit to/from stand;Sitting/lateral leans   Upper Body Dressing : Supervision/safety;Sitting   Lower Body Dressing: Supervision/safety;Sit to/from stand;Sitting/lateral leans   Toilet Transfer: Supervision/safety   Toileting- Clothing Manipulation and Hygiene: Supervision/safety              Extremity/Trunk Assessment Upper Extremity Assessment Upper Extremity Assessment: Generalized weakness   Lower Extremity Assessment Lower Extremity Assessment: Defer to PT evaluation        Vision       Perception Perception Perception: Within Functional Limits   Praxis Praxis Praxis: [facility]   Communication Communication Communication: No apparent difficulties   Cognition Arousal: Alert Behavior During Therapy: WFL for tasks assessed/performed, Flat affect  Following commands: Intact        Cueing   Cueing Techniques: Verbal cues  Exercises      Shoulder Instructions       General Comments      Pertinent Vitals/ Pain       Pain Assessment Pain Assessment: Faces Faces Pain Scale: Hurts little more Pain Location:  abdomen Pain Descriptors / Indicators: Sore Pain Intervention(s): Limited activity within patient's tolerance, Monitored during session  Home Living                                          Prior Functioning/Environment              Frequency  Min 2X/week        Progress Toward Goals  OT Goals(current goals can now be found in the care plan section)  Progress towards OT goals: Progressing toward goals  Acute Rehab OT Goals Patient Stated Goal: to go home OT Goal Formulation: With patient Time For Goal Achievement: 11/15/2024 Potential to Achieve Goals: Good ADL Goals Pt Will Perform Grooming: Independently;standing Pt Will Transfer to Toilet: with modified independence;ambulating;regular height toilet Pt Will Perform Toileting - Clothing Manipulation and hygiene: with modified independence;sitting/lateral leans Pt/caregiver will Perform Home Exercise Program: Increased strength;Both right and left upper extremity;With theraband;Independently;With written HEP provided Additional ADL Goal #1: Pt will demonstrate improved OOB activity tolerance while participating in self care task for ~10-15 minutes.  Plan      Co-evaluation                 AM-PAC OT 6 Clicks Daily Activity     Outcome Measure   Help from another person eating meals?: None Help from another person taking care of personal grooming?: None Help from another person toileting, which includes using toliet, bedpan, or urinal?: A Little Help from another person bathing (including washing, rinsing, drying)?: A Little Help from another person to put on and taking off regular upper body clothing?: None Help from another person to put on and taking off regular lower body clothing?: A Little 6 Click Score: 21    End of Session Equipment Utilized During Treatment: Gait belt  OT Visit Diagnosis: Muscle weakness (generalized) (M62.81)   Activity Tolerance Other (comment) (HR)    Patient Left in chair;with call bell/phone within reach   Nurse Communication Mobility status (HR)        Time: 9097-9069 OT Time Calculation (min): 28 min  Charges: OT General Charges $OT Visit: 1 Visit OT Treatments $Self Care/Home Management : 23-37 mins  Warrick POUR OTR/L  Acute Rehab Services  [phone] office number   Warrick Berber 11/27/2024, 9:39 AM

## 2024-11-27 NOTE — Progress Notes (Signed)
 [facility] Progress Note    Assessment/ Plan:   AKI on CKD3B Nephrotic Syndrome -baseline Cr ~1.9-2.2, followed by Dr. Rayburn OP. Has baseline nephrotic range proteinuria. Renal ultrasound without obstruction. Urine sediment relatively bland -AKI presumed to be pre-renal injury and possibly zosyn  related AKI. Zosyn  stopped. Also concern for peri-infectious GN -can hold fluids for now given that he is able to take PO, encouraged PO hydration. Low threshold to restart fluids if Cr is uptrending -Cr stable today: 3.38. Nonoliguric -his nephrotic syndrome is likely being driven by uncontrolled DM but I believe we should move forward with a renal biopsy while he's here. Last ASA dose 12/15--plan for biopsy next Monday with IR (req form in chart). Checking serologies in the interim which are pending. Thus far unrevealing with the exception of ANA positive (anti centromere ab) -no indications to start renal replacement therapy -can remove foley -Avoid nephrotoxic medications including NSAIDs and iodinated intravenous contrast exposure unless the latter is absolutely indicated.  Preferred narcotic agents for pain control are hydromorphone , fentanyl , and methadone. Morphine  should not be used. Avoid Baclofen  and avoid oral sodium phosphate and magnesium  citrate based laxatives / bowel preps. Continue strict Input and Output monitoring. Will monitor the patient closely with you and intervene or adjust therapy as indicated by changes in clinical status/labs   Biliary obstruction Elevated LFTs-improving -s/p ERCP with stent, s/p chole tube w/ IR -GI and gen surg following -zosyn  switched to rocephin  and flagyl  -s/p EGD 1216, unable to perform EUS given food in stomach  HTN -hold home lasix , can titrate hydralazine  if needed  Uncontrolled DM2 with hyperglycemia -mgmt per primary service  PE -per primary service, A/C per primary -suspecting this is secondary to nephrotic  syndrome?  Discussed with patient's wife.  Subjective:   Patient seen and examined bedside. No complaints, feels well. UOP ~1.6L.   Objective:   BP (!) 176/64 (BP Location: Right Arm)   Pulse 71   Temp 98.1 F (36.7 C)   Resp 18   Ht 5' 10 (1.778 m)   Wt 67.5 kg   SpO2 99%   BMI 21.35 kg/m   Intake/Output Summary (Last 24 hours) at 11/27/2024 1022 Last data filed at 11/27/2024 0740 Gross per 24 hour  Intake 830 ml  Output 1480 ml  Net -650 ml   Weight change:   Physical Exam: Gen: NAD, sitting up in chair CVS: RRR Resp: CTA BL Abd: soft, nt/nd, +chole drain Ext: no edema Neuro: awake/alert, following commands  Imaging: VAS US  LOWER EXTREMITY VENOUS (DVT) Result Date: 11/26/2024  Lower Venous DVT Study Patient Name:  Colin Keller  Date of Exam:   11/26/2024 Medical Rec #: [id number]      Accession #:    [id number] Date of Birth: 10-Aug-1956      Patient Gender: M Patient Age:   68 years Exam Location:  [facility] Procedure:      VAS US  LOWER EXTREMITY VENOUS (DVT) Referring Phys: CARINA BROWN --------------------------------------------------------------------------------  Indications: Pulmonary embolism.  Comparison Study: No prior exam. Performing Technologist: Edilia Elden Appl  Examination Guidelines: A complete evaluation includes B-mode imaging, spectral Doppler, color Doppler, and power Doppler as needed of all accessible portions of each vessel. Bilateral testing is considered an integral part of a complete examination. Limited examinations for reoccurring indications may be performed as noted. The reflux portion of the exam is performed with the patient in reverse Trendelenburg.  +---------+---------------+---------+-----------+----------+--------------+ RIGHT    CompressibilityPhasicitySpontaneityPropertiesThrombus Aging +---------+---------------+---------+-----------+----------+--------------+ CFV  Full           No       Yes                                  +---------+---------------+---------+-----------+----------+--------------+ SFJ      Full           Yes      Yes                                 +---------+---------------+---------+-----------+----------+--------------+ FV Prox  Full                                                        +---------+---------------+---------+-----------+----------+--------------+ FV Mid   Full                                                        +---------+---------------+---------+-----------+----------+--------------+ FV DistalFull                                                        +---------+---------------+---------+-----------+----------+--------------+ PFV      Full                                                        +---------+---------------+---------+-----------+----------+--------------+ POP      Full           Yes      Yes                                 +---------+---------------+---------+-----------+----------+--------------+ PTV      Full                                                        +---------+---------------+---------+-----------+----------+--------------+ PERO     Full                                                        +---------+---------------+---------+-----------+----------+--------------+ Plaque noted in adjacent arteries.  +---------+---------------+---------+-----------+----------+--------------+ LEFT     CompressibilityPhasicitySpontaneityPropertiesThrombus Aging +---------+---------------+---------+-----------+----------+--------------+ CFV      Full           Yes      Yes                                 +---------+---------------+---------+-----------+----------+--------------+  SFJ      Full           Yes      Yes                                 +---------+---------------+---------+-----------+----------+--------------+ FV Prox  Full                                                         +---------+---------------+---------+-----------+----------+--------------+ FV Mid   Full                                                        +---------+---------------+---------+-----------+----------+--------------+ FV DistalFull                                                        +---------+---------------+---------+-----------+----------+--------------+ PFV      Full                                                        +---------+---------------+---------+-----------+----------+--------------+ POP      Full           Yes      Yes                                 +---------+---------------+---------+-----------+----------+--------------+ PTV      Full                                                        +---------+---------------+---------+-----------+----------+--------------+ PERO     Full                                                        +---------+---------------+---------+-----------+----------+--------------+     Summary: BILATERAL: - No evidence of deep vein thrombosis seen in the lower extremities, bilaterally. -No evidence of popliteal cyst, bilaterally.   *See table(s) above for measurements and observations.    Preliminary    US  RENAL Result Date: 11/25/2024 EXAM: US  Retroperitoneum Complete, Renal. 11/25/2024 03:04:13 PM TECHNIQUE: Real-time ultrasonography of the retroperitoneum renal was performed. COMPARISON: US  Renal 01/22/2020. CLINICAL HISTORY: Acute renal failure superimposed on stage 3 chronic kidney disease, unspecified acute renal failure type, unspecified whether stage 3a or 3b CKD. FINDINGS: LIMITATIONS: There is limited visualization of the kidney secondary to bandage. RIGHT KIDNEY/URETER: Right kidney measures 9.2 x 4.9 x 4.8 cm. Normal cortical echogenicity. No  hydronephrosis. No calculus. No mass. LEFT KIDNEY/URETER: Left kidney measures 9.5 x 5.9 x 5.1 cm. Normal cortical echogenicity. No hydronephrosis. No calculus. No  mass. BLADDER: Foley catheter decompresses the bladder. IMPRESSION: 1. Limited visualization of the kidneys due to overlying bandage. No hydronephrosis. Electronically signed by: Greig Pique MD 11/25/2024 08:01 PM EST RP Workstation: [id number]    Labs: BMET Recent Labs  Lab 11/22/24 1221 11/22/24 1505 11/22/24 1735 11/23/24 0208 11/24/24 0130 11/25/24 0414 11/26/24 0440 11/27/24 0406  NA 133* 136 136 135 132* 132* 137 139  K 4.2 3.8 3.7 3.7 3.5 4.1 3.8 4.3  CL 97* 99 101 100 94* 101 106 106  CO2 22  --  22 23 23  17* 22 23  GLUCOSE 312* 299* 237* 194* 214* 296* 130* 120*  BUN 50* 48* 54* 61* 74* 77* 75* 66*  CREATININE 3.27* 3.30* 3.43* 3.73* 4.09* 3.98* 3.91* 3.38*  CALCIUM  8.1*  --  7.9* 8.2* 8.0* 8.0* 7.9* 8.5*   CBC Recent Labs  Lab 11/25/24 0414 11/26/24 0440 11/26/24 1355 11/26/24 2216 11/27/24 0406  WBC 14.0* 11.4* 13.3* 11.6* 11.9*  NEUTROABS 11.7*  --   --   --   --   HGB 7.9* 7.6* 8.4* 7.9* 7.8*  HCT 23.2* 21.9* 24.8* 23.9* 23.5*  MCV 91.0 90.5 91.2 93.7 93.3  PLT 247 251 284 274 288    Medications:     sodium chloride    Intravenous Once   Chlorhexidine  Gluconate Cloth  6 each Topical Daily   feeding supplement  1 Container Oral TID BM   hydrALAZINE   50 mg Oral Q8H   insulin  aspart  0-15 Units Subcutaneous Q4H   insulin  glargine  15 Units Subcutaneous Daily   isosorbide  mononitrate  60 mg Oral Daily   lidocaine   3 patch Transdermal Q24H   metroNIDAZOLE   500 mg Oral Q12H   pantoprazole   40 mg Oral BID   polyethylene glycol  17 g Oral Daily   senna  1 tablet Oral Daily   sodium chloride  flush  5 mL Intracatheter Q8H   sucralfate   1 g Oral BID   tamsulosin   0.4 mg Oral Daily      Ephriam Stank, MD [facility] 11/27/2024, 10:22 AM

## 2024-11-27 NOTE — Assessment & Plan Note (Addendum)
 Cr down to 3.38, good uop. Off fluids, encourage PO.  Remove foley, STRICT I/Os UOPs -Nephro consulted, appreciate their recommendations Renal US  WNL Plan for renal biopsy (tentatively 12/22) after aspirin  wash out x 5 days (starting 12/16) D/c Foley with strict I/Os Lab workup from Nephro initially reassuring, negative dsDNA.  -Bladder scans every shift - holding home lasix , hydrochlorothiazide  - AM CMP

## 2024-11-27 NOTE — Progress Notes (Signed)
 Patient heart rate is 145, MD notified. EKG ordered and shows A-fib with RVR. MD placed order for diltiazem  drip. Advised by CN we can not administer on unit. Pt must be moved to progressive unit. MD notified.

## 2024-11-27 NOTE — Assessment & Plan Note (Addendum)
 HTN/CAD/CHF: Restart Coreg , continue to hold diltiazem .  -Continue home hydralazine  50mg  q8h BPH: Continue home tamsulosin  GERD: Continue home Protonix 

## 2024-11-27 NOTE — Assessment & Plan Note (Addendum)
 AM hgb 7.8. Previous source of bleeding stabilized during endoscopy. Ferritin normal.  -Will proceed with transfusing one unit of pRBCs and f/u H/h -Hold heparin  until f/u h/h.  -Repeat PM CBC

## 2024-11-27 NOTE — Progress Notes (Addendum)
 Daily Progress Note Intern Pager: [phone]  Patient name: Colin Keller Medical record number: [id number] Date of birth: [birth year] Age: 68 y.o. Gender: male  Primary Care Provider: McDiarmid, Krystal BIRCH, MD Consultants: Nephro, GI, Gen surg, IR Code Status: Full  Pt Overview and Major Events to Date:  12-9 admitted 12-12 ERCP, transfer to ICU for persistent bradycardia 12-14 return to floor from ICU, IR and Nephro consulted, IR placed cholecystostomy tube 12-16 EUS performed, no biopsies taken due to food   Medical Decision Making:  68 year old man with history of recent small PE, CAD admitted for concern for cholangitis from biliary stricture and cholecystitis. He is s/p ERCP with stent (no brushings), percutaneous   cholecystostomy tube, and EUS. Hospital course complicated by concern for GI bleeding with oozing from duodenal ulceration requiring transfusion, AKI, and new onset atrial fibrillation.  Assessment & Plan Abdominal pain Biliary stricture (HCC) Biliary sepsis (HCC) Pt hypertensive, otherwise VSS. WBCs, LFTs stable, slightly downtrending. Reassuring current clinical picture.  -GI consulted, appreciate their recommendations  -Repeat attempt EUS tentatively scheduled for 12/18 pending availability  -NPO @ MN and stay on full liquids to facilitate gastric emptying  - Pain regimen: Continue oxycodone  5mg  q6 prn - Bowel regimen w/ miralax  bid and senna daily - Zofran  4mg  q6 prn for nausea - Continue CTX and flagyl  until 5 days post-cholecystostomy tube placement (11-25-11/19) - General surgery following  -AM CBC, CMP AKI (acute kidney injury) Cr down to 3.38, good uop. Off fluids, encourage PO.  Remove foley, STRICT I/Os UOPs -Nephro consulted, appreciate their recommendations Renal US  WNL Plan for renal biopsy (tentatively 12/22) after aspirin  wash out x 5 days (starting 12/16) D/c Foley with strict I/Os Lab workup from Nephro initially reassuring, negative dsDNA.   -Bladder scans every shift - holding home lasix , hydrochlorothiazide  - AM CMP Anemia AM hgb 7.8. Previous source of bleeding stabilized during endoscopy. Ferritin normal.  -Will proceed with transfusing one unit of pRBCs and f/u H/h -Hold heparin  until f/u h/h.  -Repeat PM CBC  Acute pulmonary embolism (HCC) Recent hx of small PE.  -DVT US  negative -Continue to hold heparin  as above  - Hold eliquis  T2DM (type 2 diabetes mellitus) (HCC) CBGs borderline high -Increase to 18u LAI - Continue SSI moderate  - Hold home metformin  Chronic health problem HTN/CAD/CHF: Restart Coreg , continue to hold diltiazem .  -Continue home hydralazine  50mg  q8h BPH: Continue home tamsulosin  GERD: Continue home Protonix   FEN/GI: Full liquids PPx: Holding heparin  Dispo:Pending clinical improvement   Subjective:  No acute events overnight.  No signs of bloody bowel movements.  Patient appears significantly improved.  Tolerating liquids.  Endorsing no abdominal pain today.  Discussed the output from his cholecystostomy tube and the expected changes in consistency and color.  Objective: Temp:  [97.1 F (36.2 C)-98.1 F (36.7 C)] 98.1 F (36.7 C) (12/17 0737) Pulse Rate:  [70-73] 71 (12/17 0737) Resp:  [12-24] 18 (12/17 0737) BP: (161-178)/(64-81) 176/64 (12/17 0737) SpO2:  [97 %-99 %] 99 % (12/17 0737) Physical Exam: General: Awake, alert, NAD. Communicates clearly. Cardio: RRR. Normal S1, S2. No murmur, rub, gallop. 2+ radial pulses b/l w/ good capillary refill.  Resp: CTA bilaterally. No wheezes, rales, or rhonchi. Normal work of breathing on room air Abdomen: soft, non-tender even to deep palpation, non-distended. Normoactive BS auscultated. No guarding, rigidity, or rebound. Negative Murphys.  Cholecystostomy tube secured in place, no erythema surrounding.  Neuro: AOx4.  No focal deficits.  Appropriately conversational.  Laboratory: Most recent CBC Lab Results  Component Value Date    WBC 11.9 (H) 11/27/2024   HGB 7.8 (L) 11/27/2024   HCT 23.5 (L) 11/27/2024   MCV 93.3 11/27/2024   PLT 288 11/27/2024   Most recent BMP    Latest Ref Rng & Units 11/27/2024    4:06 AM  BMP  Glucose 70 - 99 mg/dL 879   BUN 8 - 23 mg/dL 66   Creatinine 9.38 - 1.24 mg/dL 6.61   Sodium 864 - 854 mmol/L 139   Potassium 3.5 - 5.1 mmol/L 4.3   Chloride 98 - 111 mmol/L 106   CO2 22 - 32 mmol/L 23   Calcium  8.9 - 10.3 mg/dL 8.5    AST 25, ALT 886, alkaline phosphatase 786 Ferritin 217  Imaging/Diagnostic Tests: None   Manon Jester, DO 11/27/2024, 7:58 AM  PGY-1, [facility] FPTS Intern pager: [phone], text pages welcome Secure chat group [facility] Teaching Service

## 2024-11-27 NOTE — Assessment & Plan Note (Addendum)
 CBGs borderline high -Increase to 18u LAI - Continue SSI moderate  - Hold home metformin 

## 2024-11-27 NOTE — Plan of Care (Signed)
 FMTS Interim Progress Note  S: Evaluated patient with wife in the room given new onset A-fib with RVR.  Patient endorses no new symptoms, states that he feels totally fine just like he did this morning.  He would not know that something was going on if people had told him.  That being said, they describe that his prior heart attack was largely asymptomatic, they are reassured that we are willing that out at the moment.  He denies chest pain, palpitations or shortness of breath explicitly.  O: BP (!) 149/101   Pulse 89   Temp 98 F (36.7 C) (Oral)   Resp 16   Ht 5' 10 (1.778 m)   Wt 67.5 kg   SpO2 100%   BMI 21.35 kg/m   General: Awake, alert, NAD. Communicates clearly. Cardio: Tachycardic and irregular. No murmur, rub, gallop. 2+ radial and dorsalis pedis pulses b/l w/ good capillary refill. No LE edema Resp: CTA bilaterally. No wheezes, rales, or rhonchi. Normal work of breathing on room air Abdomen: soft, non-tender, non-distended.  Extremities: warm and well perfused Neuro: AOx4. No focal deficits.  Appropriately conversational  A/P: Afib w/ RVR Transfer to progressive LoC Metoprolol  tartrate 5mg  IV - diltiazem  administration delayed by lack of beds on floor capable of supporting dilt drip  Echo ordered Trop, TSH, CBC ordered  Manon Jester, DO 11/27/2024, 1:51 PM PGY-1, [facility] Service pager [phone]

## 2024-11-27 NOTE — Plan of Care (Signed)

## 2024-11-27 NOTE — Assessment & Plan Note (Addendum)
 Recent hx of small PE.  -DVT US  negative -Continue to hold heparin  as above  - Hold eliquis 

## 2024-11-27 NOTE — Progress Notes (Signed)
 PHARMACY - ANTICOAGULATION CONSULT NOTE  Pharmacy Consult for IV heparin  Indication: pulmonary embolus  Allergies[1]  Patient Measurements: Height: 5' 10 (177.8 cm) Weight: 67.5 kg (148 lb 13 oz) IBW/kg (Calculated) : 73  Vital Signs: Temp: 98.2 F (36.8 C) (12/17 1404) Temp Source: Oral (12/17 1404) BP: 134/89 (12/17 1404) Pulse Rate: 87 (12/17 1404)  Labs: Recent Labs    11/25/24 0414 11/26/24 0440 11/26/24 1355 11/26/24 2216 11/27/24 0406 11/27/24 1421  HGB 7.9* 7.6*   < > 7.9* 7.8* 9.6*  HCT 23.2* 21.9*   < > 23.9* 23.5* 28.2*  PLT 247 251   < > 274 288 319  APTT 66* 38*  --   --  38*  --   HEPARINUNFRC 0.44 0.38  --   --  0.14*  --   CREATININE 3.98* 3.91*  --   --  3.38*  --    < > = values in this interval not displayed.    Estimated Creatinine Clearance: 19.4 mL/min (A) (by C-G formula based on SCr of 3.38 mg/dL (H)).   Medical History: Past Medical History:  Diagnosis Date   Acute diastolic heart failure (HCC) 08/18/2018   AKI (acute kidney injury) 10/04/2023   Atherosclerosis of both carotid arteries 08/18/2020   Carotid dopplers 08/2019 40-59% bilateral stenosis and right subclavian stenosis   Bilateral carotid artery stenosis 08/18/2020   Right Carotid: Velocities in the right ICA are consistent with a 60-79%                 stenosis. Non-hemodynamically significant plaque <50% noted in                 the CCA. The ECA appears >50% stenosed. Proximal subclavian                 artery dilatation with elevated and turbulent flow just past the                 narrowing, measuring 1.6 cm.   Left Carotid: Velocities in the left ICA are    CHF (congestive heart failure) (HCC)    Chronic congestive heart failure (HCC) 10/04/2023   Transthoracic echocardiogram 10/08/2018: LVEF is approximately 50% with hypokinesis of the lateral (base/mid) and basal inferior walls T     Chronic left shoulder pain 03/18/2021   Chronic low back pain without sciatica 10/16/2008    H/o chronic LBP with h/o lumbar disc herniation and intermittent radicular pain  Chronic pain, on stable doses of Norco 10/325 TID. MRI in 2003 showing SMALL CENTRAL DISC HERNIATION AT L4-5.  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      CKD (chronic kidney disease), stage III (HCC)    Coronary artery disease    Coronary artery disease involving native heart with angina pectoris, unspecified vessel or lesion type 08/10/2023   COVID 10/04/2023   Diabetes mellitus    DKA (diabetic ketoacidosis) (HCC) 10/03/2023   Dyspnea    Fall 10/04/2023   GERD (gastroesophageal reflux disease)    Hx of CABG 08/23/2018   LIMA to LAD, RIMA to RCA, SVG to OM3, EVH via right thigh   Hyperlipidemia    Hypertension    Hypertension associated with diabetes (HCC) 12/22/2008   Qualifier: Diagnosis of  By: Edrick MD, Susan     Hypertensive nephropathy 08/18/2020   Insulin  dependent type 2 diabetes mellitus (HCC) 12/22/2020   Left ventricular dysfunction 08/18/2020   TTE ([id number]): LVEF is approximately 50% with  hypokinesis of the lateral (base/mid) and basal inferior walls The cavity size was normal.      Long term (current) use of antithrombotics/antiplatelets 09/21/2022   Planned 55-months DAPT for DES for PAD on 09/21/2022.  Planned 6 months therapy. End 03/23/23.   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 year old male admitted on 11/19/2024 with concern for cholecystitis s/p ERCP w/ stenting on 12/12 and percutaneous cholecystostomy by IR on 12/14. Recently diagnosed with new PE (11/15/24) and started on eliquis . Last dose eliquis  prior to admission 12/8 @ 9:30 pm. Pharmacy consulted to dose heparin  (no bolus d/t required pRBC transfusion, last given 12/12). Will trend aPTT until correlating with anti-xa levels with the presence of DOAC prior. Heparin  previously therapeutic on 1050 units/hr.  11/27/24 PM update: heparin  drip to resume now, plan to stop at 23:59 for GI procedure tomorrow. Will resume at previous rate of 1050 units/hr as aPTT was therapeutic at 66 sec.  Goal of Therapy:  Heparin  level 0.3-0.7 units/ml aPTT 66-102 seconds Monitor platelets by anticoagulation protocol: Yes   Plan:  Resume Heparin  infusion at 1050 units/hr  Check aPTT, heparin  level prior to stopping drip at midnight tonight  F/u anticoagulation plans after procedure 12/18  Thank you for allowing pharmacy to participate in this patient's care.  Rocky Slade, PharmD, BCPS Clinical Pharmacist 11/27/2024,3:59 PM        [1]  Allergies Allergen Reactions   Amlodipine  Other (See Comments)    Leg swelling   Empagliflozin  Other (See Comments)    Euglycemic DKA   Amitriptyline  Other (See Comments)    Urinary retention   Lisinopril  Other (See Comments)    Hyperkalemia    Nortriptyline  Hcl Other (See Comments)    Vivid / bad dreams   Statins     Aches in Joints    Simvastatin  Other (See Comments)    Arthralgias

## 2024-11-27 NOTE — Progress Notes (Signed)
 Progress Note  Patient Name: Colin Keller Date of Encounter: 11/27/2024 [facility] Cardiologist: Redell Shallow, MD   Interval Summary   Patient is asymptomatic with AF RVR.  Denied chest pain, change in shortness of breath, or palpitations. Was standing up washing his face when he was told he went into AF.  Vital Signs Vitals:   11/27/24 1022 11/27/24 1045 11/27/24 1117 11/27/24 1245  BP: (!) 142/121 (!) 139/112 (!) 138/108 (!) 149/101  Pulse: (!) 145 (!) 125 89   Resp:  20 16   Temp:  97.8 F (36.6 C) 98 F (36.7 C) 98 F (36.7 C)  TempSrc:  Oral Oral Oral  SpO2:  100% 100% 100%  Weight:      Height:        Intake/Output Summary (Last 24 hours) at 11/27/2024 1301 Last data filed at 11/27/2024 1245 Gross per 24 hour  Intake 1266 ml  Output 1980 ml  Net -714 ml      11/22/2024    6:43 AM 11/17/2024    6:00 AM 11/15/2024    4:24 PM  Last 3 Weights  Weight (lbs) 148 lb 13 oz 148 lb 5.9 oz 154 lb  Weight (kg) 67.5 kg 67.3 kg 69.854 kg      Telemetry/ECG  Went into AFL/AF at 0915, broke to sinus then went into AF , HR now ~160 - Personally Reviewed  Physical Exam  GEN: No acute distress.   Neck: No JVD Cardiac: irregularly, irregular, no murmurs, rubs, or gallops. Radial pulse 2+ Respiratory: Clear to auscultation bilaterally.  MS: No edema  Assessment & Plan  Colin Keller 68 year old male with hypertension, hyperlipidemia, type 2 diabetes mellitus, CAD s/p CABG x 3, PAD, recent diagnosis of PE in 11/2024, admitted on 12/9 with biliary sepsis. Patient underwent endoscopy and developed bradycardia. Cardiology consulted on 12/12 and felt 2/2 sepsis. Recommended was to treat underlying. Cardiology signed off.  Patient went into AF RVR today. Cardiology asked to re-engage.   Atrial Fibrillation, with episode of RVR Patient was in AF RVR, HR ~160, during interview he was given a prn dose of IV lopressor  now, AF HR ~100 Chad Vas score 4  Patient was to  start IV cardizem , though floor unable to provide that service. He was given IV lopressor  that did lower HR form 160 -> 100. Given response to BB, do not think he needs to start IV cardizem .  Would avoid amiodarone with LFTs. Class IC deferred 2/2 CAD. Defer sotalol with renal function.  Anticoagulation complicated by acute blood loss anemia and ongoing procedures.   Stop coreg  Start lopressor  50 mg BID  Restart dilt 120 mg Hold off on anticoagulation with ABLA, restart when hgb stable  Acute Blood Loss anemia C/b acute blood loss anemia requiring transfusion  Hgb today 7.8, has now received one unit today.  Anticoagulation held.   CAD s/p CABG x 3  Antiplatelet had been deferred 2/2 chronic anticoagulation, however that is currently held 2/2 ABLA BB as above  Diastolic Dysfunction At outpatient visit patient had mentioned worsening DOE which could be an anginal equivalent though given volume overload, concern for HF. Outpatient echo showed G3 DD. LVEF 45-50% with follow up 50-55% several days later. There is another Echo limited pending this admission.  Unsure if volume overload was 2/2 kidney disease or heart disease. On exam today, euvolemic.   Hypertension BP:149/101  Patient has a history of uncontrolled HTN. He was to obtained renal artery duplex outpatient. Could  consider obtaining this admission.  BB and CCB as above Continue hydralazine  50 mg TID Continue imdur  60 mg  Hyperlipidemia Statin intolerant Continue repatha  outpatient   PAD S/p atherectomy and drug coated balloon plasty to L SFA 2023 At outpatient visit had reported bilateral claudication, though not life limiting. Plan was to monitor outpatient.  Antiplatelet had been deferred 2/2 chronic anticoagulation, however that is currently held 2/2 ABLA  Carotid Artery Disease Doppler showed stable 60-79% stenosis to RCA and 40-59^ to LCA.  Continue to monitor outpatient.  Antiplatelet had been deferred 2/2  chronic anticoagulation, however that is currently held 2/2 ABLA  Biliary sepsis GI following, patient is s/p ERCP stenting IR following, patient is s/p chole tube  AKI -Nephrotic syndrome Nephrology following, Plan to obtain renal biopsy this admission. Cr today 3.38   Tobacco Use Recommend cessation.   Per primary T2DM BPH GERD Sepsis  Anemia AKI Hx of PE    For questions or updates, please contact [facility] Please consult [email or web address] for contact info under       Signed, Leontine LOISE Salen, PA-C

## 2024-11-28 ENCOUNTER — Telehealth: Payer: Self-pay

## 2024-11-28 ENCOUNTER — Encounter (HOSPITAL_COMMUNITY): Admission: EM | Disposition: A | Discharge status: Home Health | Payer: Self-pay | Source: Home / Self Care

## 2024-11-28 ENCOUNTER — Encounter

## 2024-11-28 ENCOUNTER — Encounter (HOSPITAL_COMMUNITY): Payer: Self-pay | Admitting: Family Medicine

## 2024-11-28 ENCOUNTER — Inpatient Hospital Stay (HOSPITAL_COMMUNITY): Admitting: Anesthesiology

## 2024-11-28 DIAGNOSIS — I25119 Atherosclerotic heart disease of native coronary artery with unspecified angina pectoris: Secondary | ICD-10-CM

## 2024-11-28 DIAGNOSIS — K315 Obstruction of duodenum: Secondary | ICD-10-CM | POA: Diagnosis not present

## 2024-11-28 DIAGNOSIS — G629 Polyneuropathy, unspecified: Secondary | ICD-10-CM

## 2024-11-28 DIAGNOSIS — K838 Other specified diseases of biliary tract: Secondary | ICD-10-CM | POA: Diagnosis not present

## 2024-11-28 DIAGNOSIS — I899 Noninfective disorder of lymphatic vessels and lymph nodes, unspecified: Secondary | ICD-10-CM

## 2024-11-28 DIAGNOSIS — C250 Malignant neoplasm of head of pancreas: Secondary | ICD-10-CM

## 2024-11-28 DIAGNOSIS — R188 Other ascites: Secondary | ICD-10-CM

## 2024-11-28 DIAGNOSIS — I25810 Atherosclerosis of coronary artery bypass graft(s) without angina pectoris: Secondary | ICD-10-CM

## 2024-11-28 DIAGNOSIS — T182XXA Foreign body in stomach, initial encounter: Secondary | ICD-10-CM

## 2024-11-28 DIAGNOSIS — E440 Moderate protein-calorie malnutrition: Secondary | ICD-10-CM

## 2024-11-28 DIAGNOSIS — I10 Essential (primary) hypertension: Secondary | ICD-10-CM

## 2024-11-28 DIAGNOSIS — F17210 Nicotine dependence, cigarettes, uncomplicated: Secondary | ICD-10-CM

## 2024-11-28 DIAGNOSIS — I6523 Occlusion and stenosis of bilateral carotid arteries: Secondary | ICD-10-CM | POA: Diagnosis not present

## 2024-11-28 DIAGNOSIS — I739 Peripheral vascular disease, unspecified: Secondary | ICD-10-CM | POA: Diagnosis not present

## 2024-11-28 DIAGNOSIS — G8929 Other chronic pain: Secondary | ICD-10-CM

## 2024-11-28 DIAGNOSIS — M48062 Spinal stenosis, lumbar region with neurogenic claudication: Secondary | ICD-10-CM

## 2024-11-28 DIAGNOSIS — K8689 Other specified diseases of pancreas: Secondary | ICD-10-CM | POA: Diagnosis not present

## 2024-11-28 DIAGNOSIS — D72825 Bandemia: Secondary | ICD-10-CM

## 2024-11-28 DIAGNOSIS — I4891 Unspecified atrial fibrillation: Secondary | ICD-10-CM | POA: Diagnosis not present

## 2024-11-28 HISTORY — PX: EUS: SHX5427

## 2024-11-28 HISTORY — PX: FINE NEEDLE ASPIRATION: SHX6590

## 2024-11-28 HISTORY — PX: ESOPHAGOGASTRODUODENOSCOPY: SHX5428

## 2024-11-28 HISTORY — PX: FOREIGN BODY REMOVAL: SHX962

## 2024-11-28 LAB — BPAM RBC
Blood Product Expiration Date: 202601122359
ISSUE DATE / TIME: 202512171017
Unit Type and Rh: 6200

## 2024-11-28 LAB — GLUCOSE, CAPILLARY
Glucose-Capillary: 275 mg/dL — ABNORMAL HIGH (ref 70–99)
Glucose-Capillary: 305 mg/dL — ABNORMAL HIGH (ref 70–99)
Glucose-Capillary: 113 mg/dL — ABNORMAL HIGH (ref 70–99)
Glucose-Capillary: 120 mg/dL — ABNORMAL HIGH (ref 70–99)
Glucose-Capillary: 35 mg/dL — CL (ref 70–99)
Glucose-Capillary: 114 mg/dL — ABNORMAL HIGH (ref 70–99)

## 2024-11-28 LAB — TYPE AND SCREEN
ABO/RH(D): A POS
Antibody Screen: NEGATIVE
Unit division: 0

## 2024-11-28 LAB — CBC
HCT: 26.7 % — ABNORMAL LOW (ref 39.0–52.0)
HCT: 30.4 % — ABNORMAL LOW (ref 39.0–52.0)
Hemoglobin: 9.3 g/dL — ABNORMAL LOW (ref 13.0–17.0)
Hemoglobin: 10 g/dL — ABNORMAL LOW (ref 13.0–17.0)
MCH: 32 pg (ref 26.0–34.0)
MCH: 30.7 pg (ref 26.0–34.0)
MCHC: 34.8 g/dL (ref 30.0–36.0)
MCHC: 32.9 g/dL (ref 30.0–36.0)
MCV: 91.8 fL (ref 80.0–100.0)
MCV: 93.3 fL (ref 80.0–100.0)
Platelets: 294 K/uL (ref 150–400)
Platelets: 368 K/uL (ref 150–400)
RBC: 2.91 MIL/uL — ABNORMAL LOW (ref 4.22–5.81)
RBC: 3.26 MIL/uL — ABNORMAL LOW (ref 4.22–5.81)
RDW: 15.3 % (ref 11.5–15.5)
RDW: 15.4 % (ref 11.5–15.5)
WBC: 12.1 K/uL — ABNORMAL HIGH (ref 4.0–10.5)
WBC: 14.5 K/uL — ABNORMAL HIGH (ref 4.0–10.5)
nRBC: 0 % (ref 0.0–0.2)
nRBC: 0 % (ref 0.0–0.2)

## 2024-11-28 LAB — RENAL FUNCTION PANEL
Albumin: 2.4 g/dL — ABNORMAL LOW (ref 3.5–5.0)
Anion gap: 8 (ref 5–15)
BUN: 58 mg/dL — ABNORMAL HIGH (ref 8–23)
CO2: 23 mmol/L (ref 22–32)
Calcium: 8.4 mg/dL — ABNORMAL LOW (ref 8.9–10.3)
Chloride: 103 mmol/L (ref 98–111)
Creatinine, Ser: 3.05 mg/dL — ABNORMAL HIGH (ref 0.61–1.24)
GFR, Estimated: 21 mL/min — ABNORMAL LOW (ref >60)
Glucose, Bld: 202 mg/dL — ABNORMAL HIGH (ref 70–99)
Phosphorus: 4.2 mg/dL (ref 2.5–4.6)
Potassium: 4.3 mmol/L (ref 3.5–5.1)
Sodium: 135 mmol/L (ref 135–145)

## 2024-11-28 LAB — HEPARIN LEVEL (UNFRACTIONATED): Heparin Unfractionated: 0.22 [IU]/mL — ABNORMAL LOW (ref 0.30–0.70)

## 2024-11-28 LAB — APTT: aPTT: 50 s — ABNORMAL HIGH (ref 24–36)

## 2024-11-28 SURGERY — ULTRASOUND, UPPER GI TRACT, ENDOSCOPIC
Anesthesia: Monitor Anesthesia Care

## 2024-11-28 MED ORDER — DEXTROSE 50 % IV SOLN
INTRAVENOUS | Status: AC
  Administered 2024-11-28: 18:00:00 50 mL
  Filled 2024-11-28: qty 50

## 2024-11-28 MED ORDER — HEPARIN (PORCINE) 25000 UT/250ML-% IV SOLN
1100.0000 [IU]/h | INTRAVENOUS | Status: DC
  Administered 2024-11-28: 1050 [IU]/h via INTRAVENOUS
  Administered 2024-11-29: 1100 [IU]/h via INTRAVENOUS
  Filled 2024-11-28 (×2): qty 250

## 2024-11-28 MED ORDER — INSULIN ASPART 100 UNIT/ML IJ SOLN
0.0000 [IU] | Freq: Three times a day (TID) | INTRAMUSCULAR | Status: DC
  Administered 2024-11-28: 13:00:00 15 [IU] via SUBCUTANEOUS
  Administered 2024-11-29: 3 [IU] via SUBCUTANEOUS
  Filled 2024-11-28: qty 3

## 2024-11-28 MED ORDER — INSULIN GLARGINE 100 UNIT/ML ~~LOC~~ SOLN
20.0000 [IU] | Freq: Every day | SUBCUTANEOUS | Status: DC
  Administered 2024-11-28 – 2024-11-29 (×2): 20 [IU] via SUBCUTANEOUS
  Filled 2024-11-28 (×3): qty 0.2

## 2024-11-28 MED ORDER — INSULIN ASPART 100 UNIT/ML IJ SOLN: 0.0000 [IU] | Freq: Every day | INTRAMUSCULAR | Status: DC

## 2024-11-28 MED ORDER — PROPOFOL 500 MG/50ML IV EMUL
INTRAVENOUS | Status: DC | PRN
  Administered 2024-11-28: 15:00:00 175 ug/kg/min via INTRAVENOUS

## 2024-11-28 MED ORDER — POLYVINYL ALCOHOL 1.4 % OP SOLN
1.0000 [drp] | OPHTHALMIC | Status: DC | PRN
  Administered 2024-11-29 (×2): 1 [drp] via OPHTHALMIC
  Filled 2024-11-28: qty 15

## 2024-11-28 MED ORDER — ACETAMINOPHEN 325 MG PO TABS
650.0000 mg | ORAL_TABLET | Freq: Four times a day (QID) | ORAL | Status: DC
  Administered 2024-11-28 – 2024-12-06 (×26): 650 mg via ORAL
  Filled 2024-11-28 (×24): qty 2

## 2024-11-28 MED ADMIN — Sodium Chloride IV Soln 0.9%: 500 mL | INTRAMUSCULAR | @ 15:00:00 | NDC 00338004904

## 2024-11-28 NOTE — Assessment & Plan Note (Addendum)
 Recent hx of small PE. Hgb 9.3, s/p 1u pRBCs 12/17. Stable.   -Continue to hold heparin  as above for EUS.  - PM CBC - Expeditiously restart heparin  after PM CBC post EUS - DVT US  negative - Hold eliquis 

## 2024-11-28 NOTE — Transfer of Care (Signed)
 Immediate Anesthesia Transfer of Care Note  Patient: Colin Keller  Procedure(s) Performed: ULTRASOUND, UPPER GI TRACT, ENDOSCOPIC REMOVAL, FOREIGN BODY EGD (ESOPHAGOGASTRODUODENOSCOPY) FINE NEEDLE ASPIRATION  Patient Location: PACU and Endoscopy Unit  Anesthesia Type:MAC  Level of Consciousness: sedated and responds to stimulation  Airway & Oxygen Therapy: Patient Spontanous Breathing and Patient connected to face mask oxygen  Post-op Assessment: Report given to RN  Post vital signs: Reviewed and stable  Last Vitals:  Vitals Value Taken Time  BP 93/46 11/28/24 16:18  Temp    Pulse 64 11/28/24 16:20  Resp 19 11/28/24 16:20  SpO2 100 % 11/28/24 16:20  Vitals shown include unfiled device data.  Last Pain:  Vitals:   11/28/24 1457  TempSrc: Temporal  PainSc: 6       Patients Stated Pain Goal: 5 (11/25/24 0926)  Complications: No notable events documented.

## 2024-11-28 NOTE — Anesthesia Preprocedure Evaluation (Signed)
 Anesthesia Evaluation  Patient identified by MRN, date of birth, ID band Patient awake    Reviewed: Allergy & Precautions, NPO status , Patient's Chart, lab work & pertinent test results, reviewed documented beta blocker date and time   History of Anesthesia Complications Negative for: history of anesthetic complications  Airway Mallampati: II  TM Distance: >3 FB     Dental  (+) Edentulous Upper, Edentulous Lower   Pulmonary shortness of breath, neg COPD, Current Smoker and Patient abstained from smoking., PE   breath sounds clear to auscultation       Cardiovascular hypertension, Pt. on medications + angina with exertion + CAD, + CABG, + Peripheral Vascular Disease and +CHF  (-) Past MI + dysrhythmias  Rhythm:Regular Rate:Normal     Neuro/Psych neg Seizures  Neuromuscular disease    GI/Hepatic Neg liver ROS,GERD  ,,(+) neg Cirrhosis        Endo/Other  diabetes, Type 2    Renal/GU Renal InsufficiencyRenal disease     Musculoskeletal   Abdominal   Peds  Hematology  (+) Blood dyscrasia, anemia   Anesthesia Other Findings   Reproductive/Obstetrics                              Anesthesia Physical Anesthesia Plan  ASA: 3  Anesthesia Plan: MAC   Post-op Pain Management:    Induction: Intravenous  PONV Risk Score and Plan: 1 and Ondansetron  and Propofol  infusion  Airway Management Planned: Natural Airway and Nasal Cannula  Additional Equipment:   Intra-op Plan:   Post-operative Plan:   Informed Consent: I have reviewed the patients History and Physical, chart, labs and discussed the procedure including the risks, benefits and alternatives for the proposed anesthesia with the patient or authorized representative who has indicated his/her understanding and acceptance.       Plan Discussed with: CRNA  Anesthesia Plan Comments:          Anesthesia Quick Evaluation

## 2024-11-28 NOTE — Assessment & Plan Note (Addendum)
 VSS, patient largely asymptomatic last 12 hours. WBCs continue to trend down reassuringly, no further LFTs collected.  -GI consulted, appreciate their recommendations  -Repeat attempt EUS today to evaluate pancreatic lesions  -NPO until procedure - Pain regimen: Continue oxycodone  5mg  q6 prn -patient appears to have been scaling back his usage of PRNs.  - Bowel regimen w/ miralax  bid and senna daily - Zofran  4mg  q6 prn for nausea - Continue CTX and flagyl  until 5 days post-cholecystostomy tube placement (11-25-11/19) - General surgery following  -AM CMP

## 2024-11-28 NOTE — Progress Notes (Signed)
 Patient ID: Colin Keller, male   DOB: Oct 01, 1956, 68 y.o.   MRN: [id number]    Progress Note   Subjective   Day # 9 CC; cute abdominal pain, elevated LFTs, biliary obstruction, new PE  New onset A-fib with RVR yesterday-rate much better controlled today-currently in the 90s  IV ceftriaxone /metronidazole  IV heparin -off  Cholecystostomy tube in place  Labs today WBC 12.1/hemoglobin 9.3/hematocrit 26.7 Potassium 4.3/glucose 202/BUN 58/creatinine 3.05  Echo yesterday-EF 40 to 45%, right ventricular systolic function moderately reduced, left atrial size moderately dilated trivial mitral valve regurgitation no aortic stenosis  No complaints of chest pain, no shortness of breath, says he slept a little bit last night, no abdominal pain other than discomfort at the cholecystostomy site   Objective   Vital signs in last 24 hours: Temp:  [97.5 F (36.4 C)-98.2 F (36.8 C)] 98.1 F (36.7 C) (12/18 0725) Pulse Rate:  [85-145] 87 (12/18 0725) Resp:  [16-20] 18 (12/18 0725) BP: (130-155)/(69-121) 155/69 (12/18 0725) SpO2:  [99 %-100 %] 100 % (12/18 0725) Last BM Date : 11/26/24 General:    Older white male acute distress Heart:  irRegular rate and rhythm; no murmurs Lungs: Respirations even and unlabored, lungs CTA bilaterally Abdomen:  Soft, nontender and nondistended. Normal bowel sounds.  Cystostomy tube in place Extremities:  Without edema. Neurologic:  Alert and oriented,  grossly normal neurologically. Psych:  Cooperative. Normal mood and affect.  Intake/Output from previous day: 12/17 0701 - 12/18 0700 In: 686 [P.O.:240; Blood:436] Out: 1958 [Urine:1608; Drains:350] Intake/Output this shift: Total I/O In: 100 [IV Piggyback:100] Out: -   Lab Results: Recent Labs    11/27/24 1421 11/27/24 2115 11/28/24 0506  WBC 13.3* 12.6* 12.1*  HGB 9.6* 9.6* 9.3*  HCT 28.2* 28.3* 26.7*  PLT 319 316 294   BMET Recent Labs    11/26/24 0440 11/27/24 0406 11/28/24 0506  NA  137 139 135  K 3.8 4.3 4.3  CL 106 106 103  CO2 22 23 23   GLUCOSE 130* 120* 202*  BUN 75* 66* 58*  CREATININE 3.91* 3.38* 3.05*  CALCIUM  7.9* 8.5* 8.4*   LFT Recent Labs    11/27/24 0406 11/28/24 0506  PROT 5.3*  --   ALBUMIN  2.3* 2.4*  AST 25  --   ALT 113*  --   ALKPHOS 786*  --   BILITOT 0.8  --    PT/INR No results for input(s): LABPROT, INR in the last 72 hours.  Studies/Results: VAS US  LOWER EXTREMITY VENOUS (DVT) Result Date: 11/28/2024  Lower Venous DVT Study Patient Name:  GRANT SWAGER  Date of Exam:   11/26/2024 Medical Rec #: [id number]      Accession #:    [id number] Date of Birth: Oct 08, 1956      Patient Gender: M Patient Age:   66 years Exam Location:  [facility] Procedure:      VAS US  LOWER EXTREMITY VENOUS (DVT) Referring Phys: CARINA BROWN --------------------------------------------------------------------------------  Indications: Pulmonary embolism.  Comparison Study: No prior exam. Performing Technologist: Edilia Elden Appl  Examination Guidelines: A complete evaluation includes B-mode imaging, spectral Doppler, color Doppler, and power Doppler as needed of all accessible portions of each vessel. Bilateral testing is considered an integral part of a complete examination. Limited examinations for reoccurring indications may be performed as noted. The reflux portion of the exam is performed with the patient in reverse Trendelenburg.  +---------+---------------+---------+-----------+----------+--------------+ RIGHT    CompressibilityPhasicitySpontaneityPropertiesThrombus Aging +---------+---------------+---------+-----------+----------+--------------+ CFV      Full  No       Yes                                 +---------+---------------+---------+-----------+----------+--------------+ SFJ      Full           Yes      Yes                                 +---------+---------------+---------+-----------+----------+--------------+  FV Prox  Full                                                        +---------+---------------+---------+-----------+----------+--------------+ FV Mid   Full                                                        +---------+---------------+---------+-----------+----------+--------------+ FV DistalFull                                                        +---------+---------------+---------+-----------+----------+--------------+ PFV      Full                                                        +---------+---------------+---------+-----------+----------+--------------+ POP      Full           Yes      Yes                                 +---------+---------------+---------+-----------+----------+--------------+ PTV      Full                                                        +---------+---------------+---------+-----------+----------+--------------+ PERO     Full                                                        +---------+---------------+---------+-----------+----------+--------------+ Plaque noted in adjacent arteries.  +---------+---------------+---------+-----------+----------+--------------+ LEFT     CompressibilityPhasicitySpontaneityPropertiesThrombus Aging +---------+---------------+---------+-----------+----------+--------------+ CFV      Full           Yes      Yes                                 +---------+---------------+---------+-----------+----------+--------------+ SFJ  Full           Yes      Yes                                 +---------+---------------+---------+-----------+----------+--------------+ FV Prox  Full                                                        +---------+---------------+---------+-----------+----------+--------------+ FV Mid   Full                                                        +---------+---------------+---------+-----------+----------+--------------+ FV  DistalFull                                                        +---------+---------------+---------+-----------+----------+--------------+ PFV      Full                                                        +---------+---------------+---------+-----------+----------+--------------+ POP      Full           Yes      Yes                                 +---------+---------------+---------+-----------+----------+--------------+ PTV      Full                                                        +---------+---------------+---------+-----------+----------+--------------+ PERO     Full                                                        +---------+---------------+---------+-----------+----------+--------------+     Summary: BILATERAL: - No evidence of deep vein thrombosis seen in the lower extremities, bilaterally. -No evidence of popliteal cyst, bilaterally.   *See table(s) above for measurements and observations. Electronically signed by Fonda Rim on 11/28/2024 at 7:49:10 AM.    Final    ECHOCARDIOGRAM LIMITED Result Date: 11/27/2024    ECHOCARDIOGRAM LIMITED REPORT   Patient Name:   BLEU MINERD Date of Exam: 11/27/2024 Medical Rec #:  [id number]     Height:       70.0 in Accession #:    [id number]    Weight:       148.8 lb Date of Birth:  04/28/1956     BSA:  1.841 m Patient Age:    68 years      BP:           149/101 mmHg Patient Gender: M             HR:           130 bpm. Exam Location:  Inpatient Procedure: Limited Echo, Cardiac Doppler and Color Doppler (Both Spectral and            Color Flow Doppler were utilized during procedure). Indications:    Afib with RVR  History:        Patient has prior history of Echocardiogram examinations, most                 recent 11/23/2024. PAD; Risk Factors:Dyslipidemia, Diabetes and                 Hypertension.  Sonographer:    Carmelita Hartshorn RDCS, FE, PE Referring Phys: [phone] CARINA M BROWN IMPRESSIONS  1. Left  ventricular ejection fraction, by estimation, is 40 to 45%. The left ventricle has mildly decreased function. The left ventricle demonstrates global hypokinesis. There is mild concentric left ventricular hypertrophy. Left ventricular diastolic function could not be evaluated.  2. Right ventricular systolic function is moderately reduced.  3. Left atrial size was moderately dilated.  4. The mitral valve is degenerative. Trivial mitral valve regurgitation. Severe mitral annular calcification.  5. The aortic valve is tricuspid. There is mild calcification of the aortic valve. Aortic valve sclerosis/calcification is present, without any evidence of aortic stenosis. Conclusion(s)/Recommendation(s): EF hard to assess in setting of rapid AF. FINDINGS  Left Ventricle: Left ventricular ejection fraction, by estimation, is 40 to 45%. The left ventricle has mildly decreased function. The left ventricle demonstrates global hypokinesis. There is mild concentric left ventricular hypertrophy. Left ventricular diastolic function could not be evaluated. Left ventricular diastolic function could not be evaluated due to atrial fibrillation. Right Ventricle: Right ventricular systolic function is moderately reduced. Left Atrium: Left atrial size was moderately dilated. Mitral Valve: The mitral valve is degenerative in appearance. Severe mitral annular calcification. Trivial mitral valve regurgitation. Tricuspid Valve: Tricuspid valve regurgitation is trivial. Aortic Valve: The aortic valve is tricuspid. There is mild calcification of the aortic valve. Aortic valve sclerosis/calcification is present, without any evidence of aortic stenosis. Pulmonic Valve: The pulmonic valve was not well visualized. Venous: The inferior vena cava was not well visualized. LEFT VENTRICLE PLAX 2D LVIDd:         4.00 cm LVIDs:         3.30 cm LV PW:         1.30 cm LV IVS:        1.30 cm LVOT diam:     2.30 cm LVOT Area:     4.15 cm  LEFT ATRIUM          Index LA diam:    4.30 cm 2.34 cm/m   AORTA Ao Root diam: 3.60 cm  SHUNTS Systemic Diam: 2.30 cm Toribio Fuel MD Electronically signed by Toribio Fuel MD Signature Date/Time: 11/27/2024/1:22:06 PM    Final        Assessment / Plan:    #54 68 year old white male admitted with abdominal pain, workup consistent with biliary obstruction/sepsis  Status post ERCP stent placement to CBD for CBD stricture 11/22/2024 no stones seen, unable to obtain brushings Etiology of stricture indeterminant-high with suggestion of punctate foci of calcifications in the pancreatic head/body which could be due to  underlying chronic pancreatitis but cannot rule out malignancy  Status post cholecystostomy tube placement  11/24/2024-IR following  Continues on ceftriaxone /metronidazole   #2 postsphincterotomy oozing/bleeding noted at initial attempt at EUS on 11/26/2024-this was treated for hemostasis Hemoglobin stable  #3 new onset A-fib yesterday with RVR-he was not placed on diltiazem  drip as no beds available on progressive unit.  Cardiology following, rate much better controlled today patient asymptomatic #4  new PE-heparin  on hold this morning for endoscopic procedure  #5 diabetes mellitus #6.  Anemia as above, stable multifactoral #7.  Acute on chronic kidney disease-nephrology following plan for renal biopsy #8 coronary artery disease, echo yesterday EF 40 to 45%  Plan; continue ceftriaxone /metronidazole  Keep n.p.o. today Heparin  off at 8 AM today for EUS Patient will be transported via [facility] to [facility] early this afternoon for EUS possible FNA with Dr. Wilhelmenia , and will return here postprocedure Further GI recommendations pending results of EUS    Principal Problem:   Abdominal pain Active Problems:   T2DM (type 2 diabetes mellitus) (HCC)   Anemia   Coronary artery disease involving native coronary artery of native heart without angina pectoris   AKI (acute kidney injury)    Acute pulmonary embolism (HCC)   Chronic health problem   Cholecystitis   Pancreatic lesion   Biliary stricture (HCC)   Abnormal finding on GI tract imaging   Anticoagulated   Elevated liver enzymes   Biliary obstruction (HCC)   Leukocytosis   Biliary sepsis (HCC)   S/P ERCP   Bleeding from sphincterotomy site   Atrial fibrillation with RVR (HCC)   Primary hypertension   Atrial fibrillation with rapid ventricular response (HCC)     LOS: 9 days   Harrol Novello EsterwoodPA-C  11/28/2024, 9:30 AM

## 2024-11-28 NOTE — Assessment & Plan Note (Addendum)
 HTN/CAD/CHF: Hold Coreg . Diltiazem  per Afib plan.  -Continue home hydralazine  50mg  q8h BPH: Continue home tamsulosin  GERD: Continue home Protonix 

## 2024-11-28 NOTE — Interval H&P Note (Signed)
 History and Physical Interval Note:  11/28/2024 3:16 PM  Colin Keller  has presented today for surgery, with the diagnosis of Common bile duct stricture, status post stent-abnormal pancreatic imaging-rule out malignancy.  The various methods of treatment have been discussed with the patient and family. After consideration of risks, benefits and other options for treatment, the patient has consented to  Procedures with comments: ULTRASOUND, UPPER GI TRACT, ENDOSCOPIC (N/A) - with possible FNA as a surgical intervention.  The patient's history has been reviewed, patient examined, no change in status, stable for surgery.  I have reviewed the patient's chart and labs.  Questions were answered to the patient's satisfaction.    The risks of an EUS including intestinal perforation, bleeding, infection, aspiration, and medication effects were discussed as was the possibility it may not give a definitive diagnosis if a biopsy is performed.  When a biopsy of the pancreas is done as part of the EUS, there is an additional risk of pancreatitis at the rate of about 1-2%.  It was explained that procedure related pancreatitis is typically mild, although it can be severe and even life threatening, which is why we do not perform random pancreatic biopsies and only biopsy a lesion/area we feel is concerning enough to warrant the risk.    Colin Keller

## 2024-11-28 NOTE — Assessment & Plan Note (Addendum)
 Experienced an episode of Afib w/ RvR on 12/17 afternoon. Asymptomatic at the time. Trops flat. Treated with IV metoprolol  5mg . Patient now rate controlled, rhythm still irregular. Denies chest pain, dyspnea, palpitations.  -Cardiology consulted, appreciate their recs -Continue 50mg  BID PO -Continue dilt 120mg  daily -Defer conversion until anticoagulated -Will restart heparin  pending PM CBC -Consider need for long term anticoagulation

## 2024-11-28 NOTE — Progress Notes (Signed)
 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 Post -op ERCP 12/12.    PT Comments  Pt progressing towards physical therapy goals. Pt reports feeling fatigued and anxious about upcoming procedure at [facility]. Complains of mouth being dry due to NPO status. Focus of session was ADL's at the sink where pt brushed teeth, used mouthwash, and scrubbed gums with mouth swab. Pt reports feeling better at end of session however declines any more activity, asking to go back to bed. Will continue to follow.    If plan is discharge home, recommend the following: Assistance with cooking/housework;Assist for transportation;A little help with bathing/dressing/bathroom   Can travel by private vehicle        Equipment Recommendations  None recommended by PT    Recommendations for Other Services       Precautions / Restrictions Precautions Precautions: None Recall of Precautions/Restrictions: Intact Precaution/Restrictions Comments: Drain Restrictions Weight Bearing Restrictions Per Provider Order: No     Mobility  Bed Mobility Overal bed mobility: Modified Independent             General bed mobility comments: HOB elevated    Transfers Overall transfer level: Modified independent Equipment used: None Transfers: Sit to/from Stand             General transfer comment: No UE support needed.    Ambulation/Gait Ambulation/Gait assistance: Contact guard assist, Min assist Gait Distance (Feet): 25 Feet Assistive device: None Gait Pattern/deviations: Step-through pattern, Decreased stride length, Drifts right/left Gait velocity: Decreased Gait  velocity interpretation: <1.31 ft/sec, indicative of household ambulator   General Gait Details: In room only. Occasionally reaching out for tray table or sink for support but overall without overt LOB.   Stairs             Wheelchair Mobility     Tilt Bed    Modified Rankin (Stroke Patients Only)       Balance Overall balance assessment: Needs assistance Sitting-balance support: Feet supported Sitting balance-Leahy Scale: Good     Standing balance support: No upper extremity supported Standing balance-Leahy Scale: Fair                              Hotel Manager: No apparent difficulties  Cognition Arousal: Alert Behavior During Therapy: WFL for tasks assessed/performed, Flat affect   PT - Cognitive impairments: No apparent impairments                         Following commands: Intact      Cueing Cueing Techniques: Verbal cues  Exercises      General Comments        Pertinent Vitals/Pain Pain Assessment Pain Assessment: Faces Faces Pain Scale: No hurt    Home Living                          Prior Function            PT Goals (current goals can now be found in the care plan section) Acute Rehab PT Goals Patient Stated Goal: home  PT Goal Formulation: With patient Time For Goal Achievement: 12/05/24 Potential to Achieve Goals: Good Progress towards PT goals: Progressing toward goals    Frequency    Min 2X/week      PT Plan      Co-evaluation              AM-PAC PT 6 Clicks Mobility   Outcome Measure  Help needed turning from your back to your side while in a flat bed without using bedrails?: None Help needed moving from lying on your back to sitting on the side of a flat bed without using bedrails?: None Help needed moving to and from a bed to a chair (including a wheelchair)?: A Little Help needed standing up from a chair using your arms (e.g., wheelchair or  bedside chair)?: A Little Help needed to walk in hospital room?: A Little Help needed climbing 3-5 steps with a railing? : A Little 6 Click Score: 20    End of Session Equipment Utilized During Treatment: Gait belt Activity Tolerance: Patient tolerated treatment well Patient left: in bed;with call bell/phone within reach;with family/visitor present Nurse Communication: Mobility status PT Visit Diagnosis: Muscle weakness (generalized) (M62.81)     Time: 8862-8841 PT Time Calculation (min) (ACUTE ONLY): 21 min  Charges:    $Therapeutic Activity: 8-22 mins PT General Charges $$ ACUTE PT VISIT: 1 Visit                     Leita Sable, PT, DPT Acute Rehabilitation Services Secure Chat Preferred Office: [phone]    Leita JONETTA Sable 11/28/2024, 1:33 PM

## 2024-11-28 NOTE — Op Note (Addendum)
 [facility] Patient Name: Colin Keller Procedure Date: 11/28/2024 MRN: [id number] Attending MD: Aloha Finner , MD, [id number] Date of Birth: December 27, 1955 CSN: [id number] Age: 68 Admit Type: Inpatient Procedure:                Upper EUS Indications:              Common bile duct dilation (etiology unknown) seen                            on CT scan Providers:                Aloha Finner, MD, Clotilda Schmitz, RN, Lorrayne Kitty, Technician Referring MD:              Medicines:                Monitored Anesthesia Care Complications:            No immediate complications. Estimated Blood Loss:     Estimated blood loss was minimal. Procedure:                Pre-Anesthesia Assessment:                           - Prior to the procedure, a History and Physical                            was performed, and patient medications and                            allergies were reviewed. The patient's tolerance of                            previous anesthesia was also reviewed. The risks                            and benefits of the procedure and the sedation                            options and risks were discussed with the patient.                            All questions were answered, and informed consent                            was obtained. Prior Anticoagulants: The patient has                            taken heparin , last dose was day of procedure. ASA                            Grade Assessment: III - A patient with severe  systemic disease. After reviewing the risks and                            benefits, the patient was deemed in satisfactory                            condition to undergo the procedure.                           After obtaining informed consent, the endoscope was                            passed under direct vision. Throughout the                            procedure, the patient's blood  pressure, pulse, and                            oxygen saturations were monitored continuously. The                            [id number]-[id number] ([id number]) Olympus endoscope was introduced                            through the mouth, and advanced to the second part                            of duodenum. The [id number] ([id number] Olympus                            ultrasound scope was introduced through the mouth,                            and advanced to the duodenum for ultrasound                            examination from the esophagus, stomach and                            duodenum. The upper EUS was accomplished without                            difficulty. The patient tolerated the procedure. Scope In: Scope Out: Findings:      ENDOSCOPIC FINDING: :      No gross lesions were noted in the esophagus.      A medium amount of food (residue) was found in the entire examined       stomach. Suction via Endoscope was performed which cleared the stomach.      An acquired benign-appearing, intrinsic moderate inflammatory stenosis       was found in the duodenal sweep and was traversed.      A previously placed plastic biliary stent was seen at the major papilla.       2 hemoclips present in the region of prior sphincterotomy.  ENDOSONOGRAPHIC FINDING: :      An irregular masslike region was identified in the pancreatic head. The       area was homogenous and more hypoechoic than the rest of the remaining       pancreatic parenchyma. The region measured 33 mm by 23 mm in maximal       cross-sectional diameter. The outer margins were irregular. 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 An intact       interface was seen of the superior mesenteric artery and celiac trunk       suggesting a lack of invasion. The remainder of the pancreas was       examined. The endosonographic appearance of parenchyma suggested atrophy       and mild ductal dilation/prominence.  The CBD stent could be seen       coursing through this region. Fine needle biopsy was performed. Color       Doppler imaging was utilized prior to needle puncture to confirm a lack       of significant vascular structures within the needle path. Six passes       were made with the 22 gauge Acquire biopsy needle using a transduodenal       approach. A visible core of tissue was obtained. Preliminary cytologic       examination and touch preps were performed. The cellularity of the       specimen was adequate. Final cytology results are pending.      One stent was visualized endosonographically in the common bile duct.      No malignant-appearing lymph nodes were visualized in the celiac region       (level 20) and peripancreatic region.      Endosonographic imaging in the visualized portion of the liver showed no       mass.      A small amount of perihepatic fluid, visualized as an anechoic feature,       was found in the peritoneal cavity.      Endosonographic imaging in the gastroesophageal junction showed no wall       thickening.      The cealiac region was visualized.      The esophagus, stomach and duodenum were examined endosonographically. Impression:               EGD Impression:                           - No gross lesions in the esophagus.                           - A medium amount of food (residue) in the stomach.                            Suctioned with adequate clearance.                           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                           - Plastic biliary stent at the major papilla. 2                            hemoclips noted at sphincterotomy site.                           EUS Impression:                           - A masslike region was identified in the                            pancreatic head where the CBD stent  courses.                            Cytology results are pending. 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 This was staged T2 N0 Mx by                            endosonographic criteria. The staging applies if                            malignancy is confirmed. Fine needle biopsy                            performed.                           - One stent was visualized endosonographically in                            the common bile duct.                           - No malignant-appearing lymph nodes were                            visualized in the celiac region (level 20) and                            peripancreatic region.                           - Perihepatic ascites noted. Moderate Sedation:      Not Applicable - Patient had care per Anesthesia. Recommendation:           - The patient will be observed post-procedure,  until all discharge criteria are met.                           - Return patient to hospital ward for ongoing care.                           - Patient has a contact number available for                            emergencies. The signs and symptoms of potential                            delayed complications were discussed with the                            patient. Return to normal activities tomorrow.                            Written discharge instructions were provided to the                            patient.                           - Advance diet as tolerated. FLD and ADAT, monitor                            in the setting of the significant inflammatory                            process and narrowing within the duodenal sweep.                           - Heparin  may restart @0000  without bolus. Would                            hold on DOAC for at least 24 hours to decrease risk                            of post-interventional bleeding.                            - Observe patient's clinical course.                           - Await cytology results.                           - If malignancy is noted will need referrals                            expedited to Hepatobiliary surgery and Oncology.                           - The findings  and recommendations were discussed                            with the patient.                           - The findings and recommendations were discussed                            with the referring physician. Procedure Code(s):        --- Professional ---                           [id number], Esophagogastroduodenoscopy, flexible,                            transoral; with transendoscopic ultrasound-guided                            intramural or transmural fine needle                            aspiration/biopsy(s) (includes endoscopic                            ultrasound examination of the esophagus, stomach,                            and either the duodenum or a surgically altered                            stomach where the jejunum is examined distal to the                            anastomosis) Diagnosis Code(s):        --- Professional ---                           K31.5, Obstruction of duodenum                           K86.89, Other specified diseases of pancreas                           I89.9, Noninfective disorder of lymphatic vessels                            and lymph nodes, unspecified                           R93.2, Abnormal findings on diagnostic imaging of                            liver and biliary tract CPT copyright 2022 [employer]. All rights reserved. The codes documented in this report are preliminary and upon coder review may  be revised to meet current compliance requirements. Aloha Finner, MD 11/28/2024 4:48:18  PM Number of Addenda: 0

## 2024-11-28 NOTE — Assessment & Plan Note (Addendum)
 Cr improved to 3. UOP 1600. Pt did receive one in and out cath overnight but able to void fully and spontaneously when instructed to do so.  -Continue to hold foley -Continue bladder scans, consider d/c AM -Nephro consulted, appreciate their recommendations Plan for renal biopsy (tentatively 12/22) after aspirin  wash out x 5 days (starting 12/16) Strict I/Os Lab workup from Nephro initially reassuring, negative dsDNA.  - holding home lasix , hydrochlorothiazide  - AM CMP

## 2024-11-28 NOTE — Plan of Care (Addendum)
 FMTS Interim Progress Note  S:MD at bedside to evaluate patient after returning from procedure. Patient is A&O, sleepy but arousable. Denies any chest pain, trouble breathing, shakiness, or dizziness.   O: BP 139/64 (BP Location: Left Arm)   Pulse 78   Temp (!) 96.7 F (35.9 C)   Resp 16   Ht 5' 10 (1.778 m)   Wt 67.1 kg   SpO2 100%   BMI 21.24 kg/m   General: A&O, NAD Cardiac: RRR, no m/r/g Respiratory: NWOB on RA  GI: Soft, NTTP, non-distended  Neuro: Following commands  Psych: Appropriate mood and affect  A/P: Mr. Hatchel is a 68 year old male initially admitted for abdominal pain now having further workup for concern for pancreatic mass.  Patient is now returned from [facility] long after UED.  Patient appears stable.  CBG check while MD was at bedside, noted to be 35 mg/dL. - S/p 1 ampoule of D50, receiving juice at bedside - CBG check q37m - Full liquid diet, ADAT -Continuous cardiac monitoring -Vitals per floor - Heparin  per pharmacy to start 0000, no bolus per GI recs    Lonnie, Ashaun Gaughan, MD 11/28/2024, 5:45 PM PGY-2, [facility] pager [phone]

## 2024-11-28 NOTE — Telephone Encounter (Signed)
 Patient's wife calls nurse line regarding pain medication refill.   Patient is currently hospitalized and may be discharged by the weekend or early next week.   He is due for his pain medication refill on 12/26, however, wife is asking if it can be filled on 12/04/24.   Will send refill request to PCP.  Medication is not on current med list due to hospitalization.   Please advise.   Chiquita JAYSON English, RN

## 2024-11-28 NOTE — Plan of Care (Addendum)
 Was paged by nurse Channing Hefty, she reports that this patient had a second bladder scan tonight with 450cc. Patient has been awake and alert for the last hour, though has not reported discomfort. Patient was straight cathed earlier this evening and drained ~400cc and had one other spontaneous void earlier in the evening. Due to repeat procedure later this morning and second instance of retention overnight, an order to place a foley was given.   Addendum: When nurse was about to place foley patient attempted to urinate and had output of 700. PVR was 8 cc. Patient apparently did not want to get up previously and was holding urine. Cancelled foley order. Continue bladder scans.

## 2024-11-28 NOTE — Progress Notes (Signed)
 [name] Progress Note    Assessment/ Plan:   AKI on CKD3B Nephrotic Syndrome -baseline Cr ~1.9-2.2, followed by Dr. Rayburn OP. Has baseline nephrotic range proteinuria. Renal ultrasound without obstruction. Urine sediment relatively bland -AKI presumed to be pre-renal injury and possibly zosyn  related AKI. Zosyn  stopped. Also concern for peri-infectious GN -can hold fluids for now given that he is able to take PO, encouraged PO hydration. Low threshold to restart fluids if Cr is uptrending -Cr down to 3. Nonoliguric -his nephrotic syndrome is likely being driven by uncontrolled DM but I believe we should move forward with a renal biopsy while he's here. Last ASA dose 12/15--plan for biopsy next Monday with IR (req form in chart). Serologies unrevealing with the exception of ANA positive (anti centromere ab) -no indications to start renal replacement therapy -continue with bladder scans -Avoid nephrotoxic medications including NSAIDs and iodinated intravenous contrast exposure unless the latter is absolutely indicated.  Preferred narcotic agents for pain control are hydromorphone , fentanyl , and methadone. Morphine  should not be used. Avoid Baclofen  and avoid oral sodium phosphate and magnesium  citrate based laxatives / bowel preps. Continue strict Input and Output monitoring. Will monitor the patient closely with you and intervene or adjust therapy as indicated by changes in clinical status/labs   Biliary obstruction Elevated LFTs-improving -s/p ERCP with stent, s/p chole tube w/ IR -GI and gen surg following -zosyn  switched to rocephin  and flagyl  -s/p EGD 1216, unable to perform EUS given food in stomach  HTN -hold home lasix , can titrate hydralazine  if needed  Uncontrolled DM2 with hyperglycemia -mgmt per primary service  PE -per primary service, A/C per primary -suspecting this is secondary to nephrotic syndrome?   Subjective:   Patient seen and examined  bedside. No complaints, feels well. Had some issues with urinary retention on bladder scan, ended up not requiring foley EUS planned for today   Objective:   BP (!) 155/69 (BP Location: Left Arm)   Pulse 87   Temp 98.1 F (36.7 C)   Resp 18   Ht 5' 10 (1.778 m)   Wt 67.5 kg   SpO2 100%   BMI 21.35 kg/m   Intake/Output Summary (Last 24 hours) at 11/28/2024 1037 Last data filed at 11/28/2024 0818 Gross per 24 hour  Intake 666 ml  Output 1958 ml  Net -1292 ml   Weight change:   Physical Exam: Gen: NAD, sitting up in bed urinating CVS: RRR Resp: unlabored, normal wob Abd: soft, nt/nd, +chole drain Ext: no edema Neuro: awake/alert, following commands  Imaging: VAS US  LOWER EXTREMITY VENOUS (DVT) Result Date: 11/28/2024  Lower Venous DVT Study Patient Name:  Colin Keller  Date of Exam:   11/26/2024 Medical Rec #: [id number]      Accession #:    [id number] Date of Birth: 08-25-1956      Patient Gender: M Patient Age:   68 years Exam Location:  [facility] Procedure:      VAS US  LOWER EXTREMITY VENOUS (DVT) Referring Phys: CARINA BROWN --------------------------------------------------------------------------------  Indications: Pulmonary embolism.  Comparison Study: No prior exam. Performing Technologist: Edilia Elden Appl  Examination Guidelines: A complete evaluation includes B-mode imaging, spectral Doppler, color Doppler, and power Doppler as needed of all accessible portions of each vessel. Bilateral testing is considered an integral part of a complete examination. Limited examinations for reoccurring indications may be performed as noted. The reflux portion of the exam is performed with the patient in reverse Trendelenburg.  +---------+---------------+---------+-----------+----------+--------------+ RIGHT  CompressibilityPhasicitySpontaneityPropertiesThrombus Aging +---------+---------------+---------+-----------+----------+--------------+ CFV      Full            No       Yes                                 +---------+---------------+---------+-----------+----------+--------------+ SFJ      Full           Yes      Yes                                 +---------+---------------+---------+-----------+----------+--------------+ FV Prox  Full                                                        +---------+---------------+---------+-----------+----------+--------------+ FV Mid   Full                                                        +---------+---------------+---------+-----------+----------+--------------+ FV DistalFull                                                        +---------+---------------+---------+-----------+----------+--------------+ PFV      Full                                                        +---------+---------------+---------+-----------+----------+--------------+ POP      Full           Yes      Yes                                 +---------+---------------+---------+-----------+----------+--------------+ PTV      Full                                                        +---------+---------------+---------+-----------+----------+--------------+ PERO     Full                                                        +---------+---------------+---------+-----------+----------+--------------+ Plaque noted in adjacent arteries.  +---------+---------------+---------+-----------+----------+--------------+ LEFT     CompressibilityPhasicitySpontaneityPropertiesThrombus Aging +---------+---------------+---------+-----------+----------+--------------+ CFV      Full           Yes      Yes                                 +---------+---------------+---------+-----------+----------+--------------+  SFJ      Full           Yes      Yes                                 +---------+---------------+---------+-----------+----------+--------------+ FV Prox  Full                                                         +---------+---------------+---------+-----------+----------+--------------+ FV Mid   Full                                                        +---------+---------------+---------+-----------+----------+--------------+ FV DistalFull                                                        +---------+---------------+---------+-----------+----------+--------------+ PFV      Full                                                        +---------+---------------+---------+-----------+----------+--------------+ POP      Full           Yes      Yes                                 +---------+---------------+---------+-----------+----------+--------------+ PTV      Full                                                        +---------+---------------+---------+-----------+----------+--------------+ PERO     Full                                                        +---------+---------------+---------+-----------+----------+--------------+     Summary: BILATERAL: - No evidence of deep vein thrombosis seen in the lower extremities, bilaterally. -No evidence of popliteal cyst, bilaterally.   *See table(s) above for measurements and observations. Electronically signed by Fonda Rim on 11/28/2024 at 7:49:10 AM.    Final    ECHOCARDIOGRAM LIMITED Result Date: 11/27/2024    ECHOCARDIOGRAM LIMITED REPORT   Patient Name:   Colin Keller Date of Exam: 11/27/2024 Medical Rec #:  [id number]     Height:       70.0 in Accession #:    [id number]    Weight:       148.8 lb Date of Birth:  [birth year]  BSA:          1.841 m Patient Age:    68 years      BP:           149/101 mmHg Patient Gender: M             HR:           130 bpm. Exam Location:  Inpatient Procedure: Limited Echo, Cardiac Doppler and Color Doppler (Both Spectral and            Color Flow Doppler were utilized during procedure). Indications:    Afib with RVR  History:        Patient has  prior history of Echocardiogram examinations, most                 recent 11/23/2024. PAD; Risk Factors:Dyslipidemia, Diabetes and                 Hypertension.  Sonographer:    Carmelita Hartshorn RDCS, FE, PE Referring Phys: [phone] CARINA M BROWN IMPRESSIONS  1. Left ventricular ejection fraction, by estimation, is 40 to 45%. The left ventricle has mildly decreased function. The left ventricle demonstrates global hypokinesis. There is mild concentric left ventricular hypertrophy. Left ventricular diastolic function could not be evaluated.  2. Right ventricular systolic function is moderately reduced.  3. Left atrial size was moderately dilated.  4. The mitral valve is degenerative. Trivial mitral valve regurgitation. Severe mitral annular calcification.  5. The aortic valve is tricuspid. There is mild calcification of the aortic valve. Aortic valve sclerosis/calcification is present, without any evidence of aortic stenosis. Conclusion(s)/Recommendation(s): EF hard to assess in setting of rapid AF. FINDINGS  Left Ventricle: Left ventricular ejection fraction, by estimation, is 40 to 45%. The left ventricle has mildly decreased function. The left ventricle demonstrates global hypokinesis. There is mild concentric left ventricular hypertrophy. Left ventricular diastolic function could not be evaluated. Left ventricular diastolic function could not be evaluated due to atrial fibrillation. Right Ventricle: Right ventricular systolic function is moderately reduced. Left Atrium: Left atrial size was moderately dilated. Mitral Valve: The mitral valve is degenerative in appearance. Severe mitral annular calcification. Trivial mitral valve regurgitation. Tricuspid Valve: Tricuspid valve regurgitation is trivial. Aortic Valve: The aortic valve is tricuspid. There is mild calcification of the aortic valve. Aortic valve sclerosis/calcification is present, without any evidence of aortic stenosis. Pulmonic Valve: The pulmonic valve  was not well visualized. Venous: The inferior vena cava was not well visualized. LEFT VENTRICLE PLAX 2D LVIDd:         4.00 cm LVIDs:         3.30 cm LV PW:         1.30 cm LV IVS:        1.30 cm LVOT diam:     2.30 cm LVOT Area:     4.15 cm  LEFT ATRIUM         Index LA diam:    4.30 cm 2.34 cm/m   AORTA Ao Root diam: 3.60 cm  SHUNTS Systemic Diam: 2.30 cm Toribio Fuel MD Electronically signed by Toribio Fuel MD Signature Date/Time: 11/27/2024/1:22:06 PM    Final     Labs: BMET Recent Labs  Lab 11/22/24 1735 11/23/24 9791 11/24/24 0130 11/25/24 0414 11/26/24 0440 11/27/24 0406 11/28/24 0506  NA 136 135 132* 132* 137 139 135  K 3.7 3.7 3.5 4.1 3.8 4.3 4.3  CL 101 100 94* 101 106 106 103  CO2 22 23  23 17* 22 23 23   GLUCOSE 237* 194* 214* 296* 130* 120* 202*  BUN 54* 61* 74* 77* 75* 66* 58*  CREATININE 3.43* 3.73* 4.09* 3.98* 3.91* 3.38* 3.05*  CALCIUM  7.9* 8.2* 8.0* 8.0* 7.9* 8.5* 8.4*  PHOS  --   --   --   --   --   --  4.2   CBC Recent Labs  Lab 11/25/24 0414 11/26/24 0440 11/27/24 0406 11/27/24 1421 11/27/24 2115 11/28/24 0506  WBC 14.0*   < > 11.9* 13.3* 12.6* 12.1*  NEUTROABS 11.7*  --   --   --   --   --   HGB 7.9*   < > 7.8* 9.6* 9.6* 9.3*  HCT 23.2*   < > 23.5* 28.2* 28.3* 26.7*  MCV 91.0   < > 93.3 92.5 92.2 91.8  PLT 247   < > 288 319 316 294   < > = values in this interval not displayed.    Medications:     Chlorhexidine  Gluconate Cloth  6 each Topical Daily   diltiazem   120 mg Oral Daily   feeding supplement  1 Container Oral TID BM   hydrALAZINE   50 mg Oral Q8H   insulin  aspart  0-15 Units Subcutaneous TID WC   insulin  glargine  20 Units Subcutaneous Daily   isosorbide  mononitrate  60 mg Oral Daily   lidocaine   3 patch Transdermal Q24H   metoprolol  tartrate  50 mg Oral BID   metroNIDAZOLE   500 mg Oral Q12H   multivitamin with minerals  1 tablet Oral Daily   pantoprazole   40 mg Oral BID   polyethylene glycol  17 g Oral Daily   senna  1  tablet Oral Daily   sodium chloride  flush  5 mL Intracatheter Q8H   sucralfate   1 g Oral BID   tamsulosin   0.4 mg Oral Daily      Ephriam Stank, MD [facility] 11/28/2024, 10:37 AM

## 2024-11-28 NOTE — Assessment & Plan Note (Addendum)
 CBGs > 180 last 24 hours.  Increase to 20LAI Switch to Resistant SSI w/ HS correction - Hold home metformin 

## 2024-11-28 NOTE — Progress Notes (Signed)
 PHARMACY - ANTICOAGULATION CONSULT NOTE  Pharmacy Consult for IV heparin  Indication: pulmonary embolus  Allergies[1]  Patient Measurements: Height: 5' 10 (177.8 cm) Weight: 67.1 kg (148 lb) IBW/kg (Calculated) : 73 HEPARIN  DW (KG): 67.1  Vital Signs: Temp: 96.7 F (35.9 C) (12/18 1744) Temp Source: Temporal (12/18 1457) BP: 139/64 (12/18 1744) Pulse Rate: 78 (12/18 1744)  Labs: Recent Labs    11/26/24 0440 11/26/24 1355 11/27/24 0406 11/27/24 1421 11/27/24 2115 11/27/24 2341 11/28/24 0506  HGB 7.6*   < > 7.8* 9.6* 9.6*  --  9.3*  HCT 21.9*   < > 23.5* 28.2* 28.3*  --  26.7*  PLT 251   < > 288 319 316  --  294  APTT 38*  --  38*  --   --  50*  --   HEPARINUNFRC 0.38  --  0.14*  --   --  0.22*  --   CREATININE 3.91*  --  3.38*  --   --   --  3.05*   < > = values in this interval not displayed.    Estimated Creatinine Clearance: 21.4 mL/min (A) (by C-G formula based on SCr of 3.05 mg/dL (H)).  Assessment: Colin Keller is a 68 y.o. year old male admitted on 11/19/2024 with concern for cholecystitis s/p ERCP w/ stenting on 12/12 and percutaneous cholecystostomy by IR on 12/14. Recently diagnosed with new PE (11/15/24) and started on eliquis . Last dose eliquis  prior to admission 12/8 @ 9:30 pm. Pharmacy consulted to restart heparin  at midnight (no bolus d/t required pRBC transfusion, last given 12/12).   Heparin  previously therapeutic on 1050 units/hr. Will resume at prior rate with no bolus.  Goal of Therapy:  Heparin  level 0.3-0.7 units/ml aPTT 66-102 seconds Monitor platelets by anticoagulation protocol: Yes   Plan:  Restart heparin  infusion at 1050 units/hr at midnight per FMTS Check heparin  level in 8 hours and daily while on heparin  Continue to monitor H&H and platelets  Thank you for allowing pharmacy to be a part of this patients care.  Shelba Collier, PharmD, BCPS Clinical Pharmacist     [1]  Allergies Allergen Reactions   Amlodipine  Other (See  Comments)    Leg swelling   Empagliflozin  Other (See Comments)    Euglycemic DKA   Amitriptyline  Other (See Comments)    Urinary retention   Lisinopril  Other (See Comments)    Hyperkalemia    Nortriptyline  Hcl Other (See Comments)    Vivid / bad dreams   Statins     Aches in Joints    Simvastatin  Other (See Comments)    Arthralgias

## 2024-11-28 NOTE — Plan of Care (Signed)
 FMTS Interim Progress Note  S:Visited patient at the bedside with Dr. Lorrane after getting phone call from nurse regarding phone calls from wife. She was concerned about pt's eye pain, hypoglycemia and results of his recent EUS.  Patient says that after he woke up from procedure he had intense eye pain and felt like something was in his eye. 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  Otherwise he does not have any other complaints.  Wife was on the phone while we were in the room. We informed her that patient became hypoglycemic with insulin  due to being NPO. He was treated with dextrose  and has normal CBG now.  We informed them both that patient probably has an abrasion causing pain from rubbing eyes after awaking.    O: BP (!) 140/68 (BP Location: Left Arm)   Pulse 89   Temp (!) 97.5 F (36.4 C) (Oral)   Resp 15   Ht 5' 10 (1.778 m)   Wt 67.1 kg   SpO2 100%   BMI 21.24 kg/m   General: in no acute distress  HEENT: left eye watering, eye lid mildly erythematous from patient holding towel over it. PERRLA, EOMI, no debris visible in the eye, no pain with accomodation or eye movements. Scleral melanocytosis on lateral left eye.  Resp: normal work of breathing   A/P: Biliary stricture S/p EUS. Pancreatic biopsies were taken.  - Gastroenterology to discuss with patient tomorrow   Eye Pain  Given patient's exam most likely an abrasion from rubbing eyes after waking up from anesthesia.  - Eye was irritated with lubricating eye drops.  - Eye pad dressing  - Lubricating eye drops prn  - Fluorescein testing tomorrow   Hypoglycemia  Has now improved after receiving dextrose . Most likely due to insulin  while NPO.  - CBG AC at bedtime  - has diet order in place.   PE  Patient on heparin  for PE while inpatient. Heparin  held while getting procedure.  - Restart heparin  at midnight.   Nicholas Bar, MD 11/28/2024, 10:55  PM PGY-3, [facility] Service pager [phone]

## 2024-11-28 NOTE — Anesthesia Postprocedure Evaluation (Signed)
 Anesthesia Post Note  Patient: Colin Keller  Procedure(s) Performed: ULTRASOUND, UPPER GI TRACT, ENDOSCOPIC REMOVAL, FOREIGN BODY EGD (ESOPHAGOGASTRODUODENOSCOPY) FINE NEEDLE ASPIRATION     Patient location during evaluation: PACU Anesthesia Type: MAC Level of consciousness: awake and alert Pain management: pain level controlled Vital Signs Assessment: post-procedure vital signs reviewed and stable Respiratory status: spontaneous breathing, nonlabored ventilation, respiratory function stable and patient connected to nasal cannula oxygen Cardiovascular status: stable and blood pressure returned to baseline Postop Assessment: no apparent nausea or vomiting Anesthetic complications: no   No notable events documented.  Last Vitals:  Vitals:   11/28/24 1744 11/28/24 1910  BP: 139/64 (!) 140/68  Pulse: 78 89  Resp: 16 15  Temp: (!) 35.9 C (!) 36.4 C  SpO2: 100% 100%    Last Pain:  Vitals:   11/28/24 1910  TempSrc: Oral  PainSc:                  Kenn Rekowski E

## 2024-11-28 NOTE — H&P (View-Only) (Signed)
 Patient ID: Colin Keller, male   DOB: Oct 01, 1956, 68 y.o.   MRN: [id number]    Progress Note   Subjective   Day # 9 CC; cute abdominal pain, elevated LFTs, biliary obstruction, new PE  New onset A-fib with RVR yesterday-rate much better controlled today-currently in the 90s  IV ceftriaxone /metronidazole  IV heparin -off  Cholecystostomy tube in place  Labs today WBC 12.1/hemoglobin 9.3/hematocrit 26.7 Potassium 4.3/glucose 202/BUN 58/creatinine 3.05  Echo yesterday-EF 40 to 45%, right ventricular systolic function moderately reduced, left atrial size moderately dilated trivial mitral valve regurgitation no aortic stenosis  No complaints of chest pain, no shortness of breath, says he slept a little bit last night, no abdominal pain other than discomfort at the cholecystostomy site   Objective   Vital signs in last 24 hours: Temp:  [97.5 F (36.4 C)-98.2 F (36.8 C)] 98.1 F (36.7 C) (12/18 0725) Pulse Rate:  [85-145] 87 (12/18 0725) Resp:  [16-20] 18 (12/18 0725) BP: (130-155)/(69-121) 155/69 (12/18 0725) SpO2:  [99 %-100 %] 100 % (12/18 0725) Last BM Date : 11/26/24 General:    Older white male acute distress Heart:  irRegular rate and rhythm; no murmurs Lungs: Respirations even and unlabored, lungs CTA bilaterally Abdomen:  Soft, nontender and nondistended. Normal bowel sounds.  Cystostomy tube in place Extremities:  Without edema. Neurologic:  Alert and oriented,  grossly normal neurologically. Psych:  Cooperative. Normal mood and affect.  Intake/Output from previous day: 12/17 0701 - 12/18 0700 In: 686 [P.O.:240; Blood:436] Out: 1958 [Urine:1608; Drains:350] Intake/Output this shift: Total I/O In: 100 [IV Piggyback:100] Out: -   Lab Results: Recent Labs    11/27/24 1421 11/27/24 2115 11/28/24 0506  WBC 13.3* 12.6* 12.1*  HGB 9.6* 9.6* 9.3*  HCT 28.2* 28.3* 26.7*  PLT 319 316 294   BMET Recent Labs    11/26/24 0440 11/27/24 0406 11/28/24 0506  NA  137 139 135  K 3.8 4.3 4.3  CL 106 106 103  CO2 22 23 23   GLUCOSE 130* 120* 202*  BUN 75* 66* 58*  CREATININE 3.91* 3.38* 3.05*  CALCIUM  7.9* 8.5* 8.4*   LFT Recent Labs    11/27/24 0406 11/28/24 0506  PROT 5.3*  --   ALBUMIN  2.3* 2.4*  AST 25  --   ALT 113*  --   ALKPHOS 786*  --   BILITOT 0.8  --    PT/INR No results for input(s): LABPROT, INR in the last 72 hours.  Studies/Results: VAS US  LOWER EXTREMITY VENOUS (DVT) Result Date: 11/28/2024  Lower Venous DVT Study Patient Name:  Colin Keller  Date of Exam:   11/26/2024 Medical Rec #: [id number]      Accession #:    [id number] Date of Birth: Oct 08, 1956      Patient Gender: M Patient Age:   66 years Exam Location:  [facility] Procedure:      VAS US  LOWER EXTREMITY VENOUS (DVT) Referring Phys: CARINA BROWN --------------------------------------------------------------------------------  Indications: Pulmonary embolism.  Comparison Study: No prior exam. Performing Technologist: Edilia Elden Appl  Examination Guidelines: A complete evaluation includes B-mode imaging, spectral Doppler, color Doppler, and power Doppler as needed of all accessible portions of each vessel. Bilateral testing is considered an integral part of a complete examination. Limited examinations for reoccurring indications may be performed as noted. The reflux portion of the exam is performed with the patient in reverse Trendelenburg.  +---------+---------------+---------+-----------+----------+--------------+ RIGHT    CompressibilityPhasicitySpontaneityPropertiesThrombus Aging +---------+---------------+---------+-----------+----------+--------------+ CFV      Full  No       Yes                                 +---------+---------------+---------+-----------+----------+--------------+ SFJ      Full           Yes      Yes                                 +---------+---------------+---------+-----------+----------+--------------+  FV Prox  Full                                                        +---------+---------------+---------+-----------+----------+--------------+ FV Mid   Full                                                        +---------+---------------+---------+-----------+----------+--------------+ FV DistalFull                                                        +---------+---------------+---------+-----------+----------+--------------+ PFV      Full                                                        +---------+---------------+---------+-----------+----------+--------------+ POP      Full           Yes      Yes                                 +---------+---------------+---------+-----------+----------+--------------+ PTV      Full                                                        +---------+---------------+---------+-----------+----------+--------------+ PERO     Full                                                        +---------+---------------+---------+-----------+----------+--------------+ Plaque noted in adjacent arteries.  +---------+---------------+---------+-----------+----------+--------------+ LEFT     CompressibilityPhasicitySpontaneityPropertiesThrombus Aging +---------+---------------+---------+-----------+----------+--------------+ CFV      Full           Yes      Yes                                 +---------+---------------+---------+-----------+----------+--------------+ SFJ  Full           Yes      Yes                                 +---------+---------------+---------+-----------+----------+--------------+ FV Prox  Full                                                        +---------+---------------+---------+-----------+----------+--------------+ FV Mid   Full                                                        +---------+---------------+---------+-----------+----------+--------------+ FV  DistalFull                                                        +---------+---------------+---------+-----------+----------+--------------+ PFV      Full                                                        +---------+---------------+---------+-----------+----------+--------------+ POP      Full           Yes      Yes                                 +---------+---------------+---------+-----------+----------+--------------+ PTV      Full                                                        +---------+---------------+---------+-----------+----------+--------------+ PERO     Full                                                        +---------+---------------+---------+-----------+----------+--------------+     Summary: BILATERAL: - No evidence of deep vein thrombosis seen in the lower extremities, bilaterally. -No evidence of popliteal cyst, bilaterally.   *See table(s) above for measurements and observations. Electronically signed by Fonda Rim on 11/28/2024 at 7:49:10 AM.    Final    ECHOCARDIOGRAM LIMITED Result Date: 11/27/2024    ECHOCARDIOGRAM LIMITED REPORT   Patient Name:   Colin Keller Date of Exam: 11/27/2024 Medical Rec #:  [id number]     Height:       70.0 in Accession #:    [id number]    Weight:       148.8 lb Date of Birth:  04/28/1956     BSA:  1.841 m Patient Age:    68 years      BP:           149/101 mmHg Patient Gender: M             HR:           130 bpm. Exam Location:  Inpatient Procedure: Limited Echo, Cardiac Doppler and Color Doppler (Both Spectral and            Color Flow Doppler were utilized during procedure). Indications:    Afib with RVR  History:        Patient has prior history of Echocardiogram examinations, most                 recent 11/23/2024. PAD; Risk Factors:Dyslipidemia, Diabetes and                 Hypertension.  Sonographer:    Carmelita Hartshorn RDCS, FE, PE Referring Phys: [phone] CARINA M BROWN IMPRESSIONS  1. Left  ventricular ejection fraction, by estimation, is 40 to 45%. The left ventricle has mildly decreased function. The left ventricle demonstrates global hypokinesis. There is mild concentric left ventricular hypertrophy. Left ventricular diastolic function could not be evaluated.  2. Right ventricular systolic function is moderately reduced.  3. Left atrial size was moderately dilated.  4. The mitral valve is degenerative. Trivial mitral valve regurgitation. Severe mitral annular calcification.  5. The aortic valve is tricuspid. There is mild calcification of the aortic valve. Aortic valve sclerosis/calcification is present, without any evidence of aortic stenosis. Conclusion(s)/Recommendation(s): EF hard to assess in setting of rapid AF. FINDINGS  Left Ventricle: Left ventricular ejection fraction, by estimation, is 40 to 45%. The left ventricle has mildly decreased function. The left ventricle demonstrates global hypokinesis. There is mild concentric left ventricular hypertrophy. Left ventricular diastolic function could not be evaluated. Left ventricular diastolic function could not be evaluated due to atrial fibrillation. Right Ventricle: Right ventricular systolic function is moderately reduced. Left Atrium: Left atrial size was moderately dilated. Mitral Valve: The mitral valve is degenerative in appearance. Severe mitral annular calcification. Trivial mitral valve regurgitation. Tricuspid Valve: Tricuspid valve regurgitation is trivial. Aortic Valve: The aortic valve is tricuspid. There is mild calcification of the aortic valve. Aortic valve sclerosis/calcification is present, without any evidence of aortic stenosis. Pulmonic Valve: The pulmonic valve was not well visualized. Venous: The inferior vena cava was not well visualized. LEFT VENTRICLE PLAX 2D LVIDd:         4.00 cm LVIDs:         3.30 cm LV PW:         1.30 cm LV IVS:        1.30 cm LVOT diam:     2.30 cm LVOT Area:     4.15 cm  LEFT ATRIUM          Index LA diam:    4.30 cm 2.34 cm/m   AORTA Ao Root diam: 3.60 cm  SHUNTS Systemic Diam: 2.30 cm Toribio Fuel MD Electronically signed by Toribio Fuel MD Signature Date/Time: 11/27/2024/1:22:06 PM    Final        Assessment / Plan:    #54 68 year old white male admitted with abdominal pain, workup consistent with biliary obstruction/sepsis  Status post ERCP stent placement to CBD for CBD stricture 11/22/2024 no stones seen, unable to obtain brushings Etiology of stricture indeterminant-high with suggestion of punctate foci of calcifications in the pancreatic head/body which could be due to  underlying chronic pancreatitis but cannot rule out malignancy  Status post cholecystostomy tube placement  11/24/2024-IR following  Continues on ceftriaxone /metronidazole   #2 postsphincterotomy oozing/bleeding noted at initial attempt at EUS on 11/26/2024-this was treated for hemostasis Hemoglobin stable  #3 new onset A-fib yesterday with RVR-he was not placed on diltiazem  drip as no beds available on progressive unit.  Cardiology following, rate much better controlled today patient asymptomatic #4  new PE-heparin  on hold this morning for endoscopic procedure  #5 diabetes mellitus #6.  Anemia as above, stable multifactoral #7.  Acute on chronic kidney disease-nephrology following plan for renal biopsy #8 coronary artery disease, echo yesterday EF 40 to 45%  Plan; continue ceftriaxone /metronidazole  Keep n.p.o. today Heparin  off at 8 AM today for EUS Patient will be transported via [facility] to [facility] early this afternoon for EUS possible FNA with Dr. Wilhelmenia , and will return here postprocedure Further GI recommendations pending results of EUS    Principal Problem:   Abdominal pain Active Problems:   T2DM (type 2 diabetes mellitus) (HCC)   Anemia   Coronary artery disease involving native coronary artery of native heart without angina pectoris   AKI (acute kidney injury)    Acute pulmonary embolism (HCC)   Chronic health problem   Cholecystitis   Pancreatic lesion   Biliary stricture (HCC)   Abnormal finding on GI tract imaging   Anticoagulated   Elevated liver enzymes   Biliary obstruction (HCC)   Leukocytosis   Biliary sepsis (HCC)   S/P ERCP   Bleeding from sphincterotomy site   Atrial fibrillation with RVR (HCC)   Primary hypertension   Atrial fibrillation with rapid ventricular response (HCC)     LOS: 9 days   Harrol Novello EsterwoodPA-C  11/28/2024, 9:30 AM

## 2024-11-28 NOTE — Progress Notes (Signed)
 Initial Nutrition Assessment  DOCUMENTATION CODES:  Non-severe (moderate) malnutrition in context of chronic illness  INTERVENTION:  Advance diet as tolerated to heart-healthy diet. Glucerna Shake PO TID when diet advances. Each supplement provides 220 Kcals and 10 grams of protein. Continue Boost Breeze TID if the patient is to remain on a clear-liquid diet. Each supplement provides 250 Kcals and 9 grams of protein. Continue Multivitamin PO once daily. Consider Creon with meals.  NUTRITION DIAGNOSIS:  Moderate Malnutrition related to chronic illness as evidenced by severe muscle depletion, moderate fat depletion   GOAL:  Patient will meet greater than or equal to 90% of their needs   MONITOR:  PO intake, Supplement acceptance, Diet advancement, Labs, Weight trends  REASON FOR ASSESSMENT:  Malnutrition Screening Tool, NPO/Clear Liquid Diet    ASSESSMENT:  Patient presented with jaundice and worsening abdominal pain without decrease in appetite and was found to have biliary obstruction with sepsis and AKI. PMH significant for recent admission with pulmonary embolism discharged 11/17/24, DM2, CKD3, CHF, CAD s/p CABG, HTN, dyslipidemia, PVD, chronic back pain, and GERD.  12/09 MRCP 12/12 ERCP with biliary sphincterotomy and CBD stent, Rapid Response required for unresponsiveness post-operatively. 12/14 cholecystostomy tube placement 12/16 failed EUS due to stomach full of food 12/18 planned EUS at [facility] this afternoon  Visited the pleasant patient who states his appetite has been very good even throughout his GI issues and workups. He was eating well and drinking ONS whenever he was not NPO and denies any abdominal discomfort or pain with PO. He dislikes [employer] but states he will drink them if he is on a CLD after I explained that it is the best form of nutrition while on clears. His UBW is 145-150 lbs and has been stable. He tells me he has always been a lean person all my  life. He is typically quite active as a painter/handyman.  Typical day's intake: Breakfast - eggs, sausage, gravy Lunch - typically skips if he is too busy with work but will bring cookies to snack on for his blood sugar. Dinner - spaghetti  Scheduled Meds:  Chlorhexidine  Gluconate Cloth  6 each Topical Daily   diltiazem   120 mg Oral Daily   feeding supplement  1 Container Oral TID BM   hydrALAZINE   50 mg Oral Q8H   insulin  aspart  0-15 Units Subcutaneous TID WC   insulin  glargine  20 Units Subcutaneous Daily   isosorbide  mononitrate  60 mg Oral Daily   lidocaine   3 patch Transdermal Q24H   metoprolol  tartrate  50 mg Oral BID   metroNIDAZOLE   500 mg Oral Q12H   multivitamin with minerals  1 tablet Oral Daily   pantoprazole   40 mg Oral BID   polyethylene glycol  17 g Oral Daily   senna  1 tablet Oral Daily   sodium chloride  flush  5 mL Intracatheter Q8H   sucralfate   1 g Oral BID   tamsulosin   0.4 mg Oral Daily   Continuous Infusions:  cefTRIAXone  (ROCEPHIN )  IV Stopped (11/27/24 2306)   PRN Meds:.alum & mag hydroxide-simeth, melatonin, ondansetron  (ZOFRAN ) IV, oxyCODONE , polyethylene glycol  Diet Order             Diet NPO time specified Except for: Sips with Meds  Diet effective midnight                  Meal Intake: 100% when not NPO per patient  Labs:     Latest Ref Rng &  Units 11/28/2024    5:06 AM 11/27/2024    4:06 AM 11/26/2024    4:40 AM  CMP  Glucose 70 - 99 mg/dL 797  879  869   BUN 8 - 23 mg/dL 58  66  75   Creatinine 0.61 - 1.24 mg/dL 6.94  6.61  6.08   Sodium 135 - 145 mmol/L 135  139  137   Potassium 3.5 - 5.1 mmol/L 4.3  4.3  3.8   Chloride 98 - 111 mmol/L 103  106  106   CO2 22 - 32 mmol/L 23  23  22    Calcium  8.9 - 10.3 mg/dL 8.4  8.5  7.9   Total Protein 6.5 - 8.1 g/dL  5.3  5.1   Total Bilirubin 0.0 - 1.2 mg/dL  0.8  1.5   Alkaline Phos 38 - 126 U/L  786  721   AST 15 - 41 U/L  25  43   ALT 0 - 44 U/L  113  129     I/O: -135 mL  since admit  NUTRITION - FOCUSED PHYSICAL EXAM: Flowsheet Row Most Recent Value  Orbital Region No depletion  Upper Arm Region Mild depletion  Thoracic and Lumbar Region Moderate depletion  Buccal Region No depletion  Temple Region Moderate depletion  Clavicle Bone Region No depletion  Clavicle and Acromion Bone Region Moderate depletion  Scapular Bone Region Severe depletion  Dorsal Hand Moderate depletion  Patellar Region No depletion  Anterior Thigh Region Mild depletion  Posterior Calf Region Severe depletion  Edema (RD Assessment) Mild  [+1 RLE]  Hair Reviewed  Eyes Reviewed  Mouth Reviewed  Skin Reviewed  Nails Reviewed    EDUCATION NEEDS:  Education needs have been addressed  Skin:  Skin Assessment: Reviewed RN Assessment  Last BM:  12/16  Height:  Ht Readings from Last 1 Encounters:  11/19/24 5' 10 (1.778 m)   Weight:  Wt Readings from Last 10 Encounters:  11/22/24 67.5 kg  11/17/24 67.3 kg  11/12/24 69.9 kg  10/30/24 66.6 kg  09/24/24 64.2 kg  08/22/24 64 kg  06/11/24 62.2 kg  03/20/24 67.4 kg  03/19/24 67.2 kg  02/20/24 66.3 kg   Weight Change: relatively stable weight noted  Usual Body Weight: 145-150 lbs - has been stable  Edema: +1 RLE  Ideal Body Weight:  75.5 kg   BMI:  Body mass index is 21.35 kg/m.  Estimated Nutritional Needs:  Kcal:  1900-2200 Protein:  100-120 Fluid:  >/=1900    Leverne Ruth, MS, RDN, LDN [place] See AMION for contact information Secure chat preferred

## 2024-11-28 NOTE — TOC Progression Note (Signed)
 Transition of Care [facility]) - Progression Note    Patient Details  Name: Colin Keller MRN: [id number] Date of Birth: 1956/07/27  [facility]) CM/SW Contact  Tom-Johnson, Joey Hudock Daphne, RN Phone Number: 11/28/2024, 1:55 PM  Clinical Narrative:     Home health recommended, patient has no preference. Referral sent via Hub and [facility] accepted, info on AVS.   Patient not Medically ready for discharge.  CM will continue to follow as patient progresses with care towards discharge.                    Expected Discharge Plan and Services                                               Social Drivers of Health (SDOH) Interventions SDOH Screenings   Food Insecurity: No Food Insecurity (11/19/2024)  Housing: Low Risk (11/19/2024)  Transportation Needs: No Transportation Needs (11/19/2024)  Utilities: Not At Risk (11/19/2024)  Alcohol  Screen: Low Risk (08/07/2023)  Depression (PHQ2-9): Low Risk (08/22/2024)  Financial Resource Strain: Low Risk (08/07/2023)  Physical Activity: Sufficiently Active (08/07/2023)  Social Connections: Moderately Isolated (11/19/2024)  Stress: No Stress Concern Present (08/07/2023)  Tobacco Use: High Risk (11/26/2024)  Health Literacy: Adequate Health Literacy (08/07/2023)    Readmission Risk Interventions     No data to display

## 2024-11-28 NOTE — Progress Notes (Signed)
 Progress Note  2 Days Post-Op  Subjective: Patient denies pain. Currently NPO. No n/v. Had BM 2 days ago. Having flatulence. Feels bloated.   ROS  All negative with the exception of above.  Objective: Vital signs in last 24 hours: Temp:  [97.5 F (36.4 C)-98.2 F (36.8 C)] 98.1 F (36.7 C) (12/18 0725) Pulse Rate:  [85-145] 87 (12/18 0725) Resp:  [16-20] 18 (12/18 0725) BP: (130-155)/(69-121) 155/69 (12/18 0725) SpO2:  [99 %-100 %] 100 % (12/18 0725) Last BM Date : 11/26/24  Intake/Output from previous day: 12/17 0701 - 12/18 0700 In: 686 [P.O.:240; Blood:436] Out: 1958 [Urine:1608; Drains:350] Intake/Output this shift: Total I/O In: 100 [IV Piggyback:100] Out: -   PE: General: Pleasant male who is laying in bed in NAD. HEENT: Head is normocephalic, atraumatic. Heart: HR normal during encounter.  Lungs: Respiratory effort nonlabored on room air.  Abd: Soft, NT. Mild distention. No rebound tenderness or guarding. Right sided drain in place with brown thin fluid draining. MS: Able to move all 4 extremities. Psych: A&Ox3 with an appropriate affect.    Lab Results:  Recent Labs    11/27/24 2115 11/28/24 0506  WBC 12.6* 12.1*  HGB 9.6* 9.3*  HCT 28.3* 26.7*  PLT 316 294   BMET Recent Labs    11/27/24 0406 11/28/24 0506  NA 139 135  K 4.3 4.3  CL 106 103  CO2 23 23  GLUCOSE 120* 202*  BUN 66* 58*  CREATININE 3.38* 3.05*  CALCIUM  8.5* 8.4*   PT/INR No results for input(s): LABPROT, INR in the last 72 hours. CMP     Component Value Date/Time   NA 135 11/28/2024 0506   NA 137 10/30/2024 1126   K 4.3 11/28/2024 0506   CL 103 11/28/2024 0506   CO2 23 11/28/2024 0506   GLUCOSE 202 (H) 11/28/2024 0506   BUN 58 (H) 11/28/2024 0506   BUN 38 (H) 10/30/2024 1126   CREATININE 3.05 (H) 11/28/2024 0506   CREATININE 1.28 11/24/2023 0000   CALCIUM  8.4 (L) 11/28/2024 0506   PROT 5.3 (L) 11/27/2024 0406   PROT 6.2 06/11/2024 1046   ALBUMIN  2.4 (L)  11/28/2024 0506   ALBUMIN  3.6 (L) 06/11/2024 1046   AST 25 11/27/2024 0406   ALT 113 (H) 11/27/2024 0406   ALKPHOS 786 (H) 11/27/2024 0406   BILITOT 0.8 11/27/2024 0406   BILITOT <0.2 06/11/2024 1046   GFRNONAA 21 (L) 11/28/2024 0506   GFRNONAA 62 08/12/2015 0943   GFRAA 55 (L) 12/10/2020 1643   GFRAA 71 08/12/2015 0943   Lipase     Component Value Date/Time   LIPASE <10 (L) 11/26/2024 1100       Studies/Results: VAS US  LOWER EXTREMITY VENOUS (DVT) Result Date: 11/28/2024  Lower Venous DVT Study Patient Name:  JOZIAH DOLLINS  Date of Exam:   11/26/2024 Medical Rec #: [id number]      Accession #:    [id number] Date of Birth: 07/23/1956      Patient Gender: M Patient Age:   68 years Exam Location:  [facility] Procedure:      VAS US  LOWER EXTREMITY VENOUS (DVT) Referring Phys: CARINA BROWN --------------------------------------------------------------------------------  Indications: Pulmonary embolism.  Comparison Study: No prior exam. Performing Technologist: Edilia Elden Appl  Examination Guidelines: A complete evaluation includes B-mode imaging, spectral Doppler, color Doppler, and power Doppler as needed of all accessible portions of each vessel. Bilateral testing is considered an integral part of a complete examination. Limited examinations for  reoccurring indications may be performed as noted. The reflux portion of the exam is performed with the patient in reverse Trendelenburg.  +---------+---------------+---------+-----------+----------+--------------+ RIGHT    CompressibilityPhasicitySpontaneityPropertiesThrombus Aging +---------+---------------+---------+-----------+----------+--------------+ CFV      Full           No       Yes                                 +---------+---------------+---------+-----------+----------+--------------+ SFJ      Full           Yes      Yes                                  +---------+---------------+---------+-----------+----------+--------------+ FV Prox  Full                                                        +---------+---------------+---------+-----------+----------+--------------+ FV Mid   Full                                                        +---------+---------------+---------+-----------+----------+--------------+ FV DistalFull                                                        +---------+---------------+---------+-----------+----------+--------------+ PFV      Full                                                        +---------+---------------+---------+-----------+----------+--------------+ POP      Full           Yes      Yes                                 +---------+---------------+---------+-----------+----------+--------------+ PTV      Full                                                        +---------+---------------+---------+-----------+----------+--------------+ PERO     Full                                                        +---------+---------------+---------+-----------+----------+--------------+ Plaque noted in adjacent arteries.  +---------+---------------+---------+-----------+----------+--------------+ LEFT     CompressibilityPhasicitySpontaneityPropertiesThrombus Aging +---------+---------------+---------+-----------+----------+--------------+ CFV      Full  Yes      Yes                                 +---------+---------------+---------+-----------+----------+--------------+ SFJ      Full           Yes      Yes                                 +---------+---------------+---------+-----------+----------+--------------+ FV Prox  Full                                                        +---------+---------------+---------+-----------+----------+--------------+ FV Mid   Full                                                         +---------+---------------+---------+-----------+----------+--------------+ FV DistalFull                                                        +---------+---------------+---------+-----------+----------+--------------+ PFV      Full                                                        +---------+---------------+---------+-----------+----------+--------------+ POP      Full           Yes      Yes                                 +---------+---------------+---------+-----------+----------+--------------+ PTV      Full                                                        +---------+---------------+---------+-----------+----------+--------------+ PERO     Full                                                        +---------+---------------+---------+-----------+----------+--------------+     Summary: BILATERAL: - No evidence of deep vein thrombosis seen in the lower extremities, bilaterally. -No evidence of popliteal cyst, bilaterally.   *See table(s) above for measurements and observations. Electronically signed by Fonda Rim on 11/28/2024 at 7:49:10 AM.    Final    ECHOCARDIOGRAM LIMITED Result Date: 11/27/2024    ECHOCARDIOGRAM LIMITED REPORT   Patient Name:   Sunil ARKIN IMRAN Date of Exam: 11/27/2024 Medical Rec #:  [id number]     Height:       70.0 in Accession #:    [id number]    Weight:       148.8 lb Date of Birth:  [birth year]     BSA:          1.841 m Patient Age:    68 years      BP:           149/101 mmHg Patient Gender: M             HR:           130 bpm. Exam Location:  Inpatient Procedure: Limited Echo, Cardiac Doppler and Color Doppler (Both Spectral and            Color Flow Doppler were utilized during procedure). Indications:    Afib with RVR  History:        Patient has prior history of Echocardiogram examinations, most                 recent 11/23/2024. PAD; Risk Factors:Dyslipidemia, Diabetes and                 Hypertension.  Sonographer:    Carmelita Hartshorn RDCS, FE, PE Referring Phys: [phone] CARINA M BROWN IMPRESSIONS  1. Left ventricular ejection fraction, by estimation, is 40 to 45%. The left ventricle has mildly decreased function. The left ventricle demonstrates global hypokinesis. There is mild concentric left ventricular hypertrophy. Left ventricular diastolic function could not be evaluated.  2. Right ventricular systolic function is moderately reduced.  3. Left atrial size was moderately dilated.  4. The mitral valve is degenerative. Trivial mitral valve regurgitation. Severe mitral annular calcification.  5. The aortic valve is tricuspid. There is mild calcification of the aortic valve. Aortic valve sclerosis/calcification is present, without any evidence of aortic stenosis. Conclusion(s)/Recommendation(s): EF hard to assess in setting of rapid AF. FINDINGS  Left Ventricle: Left ventricular ejection fraction, by estimation, is 40 to 45%. The left ventricle has mildly decreased function. The left ventricle demonstrates global hypokinesis. There is mild concentric left ventricular hypertrophy. Left ventricular diastolic function could not be evaluated. Left ventricular diastolic function could not be evaluated due to atrial fibrillation. Right Ventricle: Right ventricular systolic function is moderately reduced. Left Atrium: Left atrial size was moderately dilated. Mitral Valve: The mitral valve is degenerative in appearance. Severe mitral annular calcification. Trivial mitral valve regurgitation. Tricuspid Valve: Tricuspid valve regurgitation is trivial. Aortic Valve: The aortic valve is tricuspid. There is mild calcification of the aortic valve. Aortic valve sclerosis/calcification is present, without any evidence of aortic stenosis. Pulmonic Valve: The pulmonic valve was not well visualized. Venous: The inferior vena cava was not well visualized. LEFT VENTRICLE PLAX 2D LVIDd:         4.00 cm LVIDs:         3.30 cm LV PW:         1.30 cm LV IVS:         1.30 cm LVOT diam:     2.30 cm LVOT Area:     4.15 cm  LEFT ATRIUM         Index LA diam:    4.30 cm 2.34 cm/m   AORTA Ao Root diam: 3.60 cm  SHUNTS Systemic Diam: 2.30 cm Toribio Fuel MD Electronically signed by Toribio Fuel MD Signature Date/Time: 11/27/2024/1:22:06 PM    Final     Anti-infectives: Anti-infectives (From admission, onward)    Start  Dose/Rate Route Frequency Ordered Stop   11/26/24 2200  cefTRIAXone  (ROCEPHIN ) 2 g in sodium chloride  0.9 % 100 mL IVPB        2 g 200 mL/hr over 30 Minutes Intravenous Every 24 hours 11/26/24 1458 11/28/24 2359   11/25/24 2200  metroNIDAZOLE  (FLAGYL ) tablet 500 mg        500 mg Oral Every 12 hours 11/25/24 0949 11/28/24 2359   11/24/24 2200  cefTRIAXone  (ROCEPHIN ) 2 g in sodium chloride  0.9 % 100 mL IVPB  Status:  Discontinued        2 g 200 mL/hr over 30 Minutes Intravenous Every 24 hours 11/24/24 1921 11/26/24 1458   11/24/24 2200  metroNIDAZOLE  (FLAGYL ) IVPB 500 mg  Status:  Discontinued        500 mg 100 mL/hr over 60 Minutes Intravenous Every 12 hours 11/24/24 1921 11/25/24 0949   11/23/24 1400  piperacillin -tazobactam (ZOSYN ) IVPB 2.25 g  Status:  Discontinued        2.25 g 100 mL/hr over 30 Minutes Intravenous Every 6 hours 11/23/24 0947 11/24/24 1921   11/22/24 2100  piperacillin -tazobactam (ZOSYN ) IVPB 3.375 g  Status:  Discontinued        3.375 g 12.5 mL/hr over 240 Minutes Intravenous Every 8 hours 11/22/24 2100 11/23/24 0947   11/19/24 2000  piperacillin -tazobactam (ZOSYN ) IVPB 3.375 g  Status:  Discontinued        3.375 g 12.5 mL/hr over 240 Minutes Intravenous Every 8 hours 11/19/24 1901 11/22/24 2100   11/19/24 1300  piperacillin -tazobactam (ZOSYN ) IVPB 3.375 g        3.375 g 100 mL/hr over 30 Minutes Intravenous  Once 11/19/24 1259 11/19/24 1508        Assessment/Plan Abdominal pain Biliary obstruction/Sepsis Biliary stricture -ERCP and stent placement to common bile duct for CBD stricture 11/22/2024;  No stones seen but unable to obtain brushing.  -Status post cholecystostomy tube placement 11/24/2024 for acute cholecystitis; Output noted to be 350 mL. -Pt denies abdominal pain. No n/v. Had bowel movement 2 days ago. Having flatulence.  -EUS planned tentatively today. -Agree with continued IV abx.  FEN: NPO; IVF per primary team VTE: Heparin   ID: Metronidazole /rocephin   Biliary stricture/Biliary sepsis AKI Anemia Acute pulmonary embolism T2DM HTN CAD CHF BPH GERD   LOS: 9 days   I reviewed consulting provider notes, specialist notes, hospitalist notes, nursing notes, last 24 h vitals and pain scores, last 48 h intake and output, last 24 h labs and trends, and last 24 h imaging results.  This care required moderate level of medical decision making.   Marjorie Carlyon Favre, [facility] 11/28/2024, 10:21 AM Please see Amion for pager number during day hours 7:00am-4:30pm

## 2024-11-28 NOTE — Progress Notes (Signed)
 Daily Progress Note Intern Pager: [phone]  Patient name: Colin Keller Medical record number: [id number] Date of birth: 1956/10/29 Age: 68 y.o. Gender: male  Primary Care Provider: McDiarmid, Krystal BIRCH, MD Consultants: GI, Gen Surg, Nephro, Cardiology Code Status: Full   Pt Overview and Major Events to Date:  12-9 admitted 12-12 ERCP, transfer to ICU for persistent bradycardia 12-14 return to floor from ICU, IR and Nephro consulted, IR placed cholecystostomy tube 12-16 EUS performed, no biopsies taken due to food  12/17 - Afib RvR 12/18 EUS   Medical Decision Making:  68 year old man with history of recent small PE, CAD admitted with abdominal pain. He is now s/p ERCP for biliary stricture with concern for cholangitis and percutaneous cholecystostomy tube for acute cholecystitis.  He is undergoing repeat endoscopic ultrasound today to evaluate suspicious pancreatic cysts.  On 12/17 he did have an episode of A-fib with RVR that was successfully controlled with IV metoprolol , now on p.o. metoprolol  and diltiazem .  Will restart heparin  pending an afternoon H&H given his fluctuating severe anemia requiring 1 unit of PRBCs on 12/17.  Patient's clinical presentation has been significantly improved and reassuring since placement of the cholecystostomy tube.  Assessment & Plan Coronary artery disease involving native coronary artery of native heart without angina pectoris Atrial fibrillation with RVR [facility]) Experienced an episode of Afib w/ RvR on 12/17 afternoon. Asymptomatic at the time. Trops flat. Treated with IV metoprolol  5mg . Patient now rate controlled, rhythm still irregular. Denies chest pain, dyspnea, palpitations.  -Cardiology consulted, appreciate their recs -Continue 50mg  BID PO -Continue dilt 120mg  daily -Defer conversion until anticoagulated -Will restart heparin  pending PM CBC -Consider need for long term anticoagulation Abdominal pain Biliary stricture (HCC) Biliary  sepsis (HCC) VSS, patient largely asymptomatic last 12 hours. WBCs continue to trend down reassuringly, no further LFTs collected.  -GI consulted, appreciate their recommendations  -Repeat attempt EUS today to evaluate pancreatic lesions  -NPO until procedure - Pain regimen: Continue oxycodone  5mg  q6 prn -patient appears to have been scaling back his usage of PRNs.  - Bowel regimen w/ miralax  bid and senna daily - Zofran  4mg  q6 prn for nausea - Continue CTX and flagyl  until 5 days post-cholecystostomy tube placement (11-25-11/19) - General surgery following  -AM CMP AKI (acute kidney injury) Cr improved to 3. UOP 1600. Pt did receive one in and out cath overnight but able to void fully and spontaneously when instructed to do so.  -Continue to hold foley -Continue bladder scans, consider d/c AM -Nephro consulted, appreciate their recommendations Plan for renal biopsy (tentatively 12/22) after aspirin  wash out x 5 days (starting 12/16) Strict I/Os Lab workup from Nephro initially reassuring, negative dsDNA.  - holding home lasix , hydrochlorothiazide  - AM CMP Anemia Acute pulmonary embolism (HCC) Recent hx of small PE. Hgb 9.3, s/p 1u pRBCs 12/17. Stable.   -Continue to hold heparin  as above for EUS.  - PM CBC - Expeditiously restart heparin  after PM CBC post EUS - DVT US  negative - Hold eliquis  T2DM (type 2 diabetes mellitus) (HCC) CBGs > 180 last 24 hours.  Increase to 20LAI Switch to Resistant SSI w/ HS correction - Hold home metformin  Chronic health problem HTN/CAD/CHF: Hold Coreg . Diltiazem  per Afib plan.  -Continue home hydralazine  50mg  q8h BPH: Continue home tamsulosin  GERD: Continue home Protonix     FEN/GI: NPO PPx: Holding for procedure, restart heparin  on return Dispo:Pending clinical improvement    Subjective:  NAEON. He does report trouble sleeping due to  back pain which he reports he tossed and turned trying to find a comfortable position. No abd pain, chest  pain, dyspnea or palpitation today. He reports feeling well outside of understandable hunger from frequently being NPO.   Objective: Temp:  [97.5 F (36.4 C)-98.2 F (36.8 C)] 98.1 F (36.7 C) (12/18 0725) Pulse Rate:  [85-145] 87 (12/18 0725) Resp:  [16-20] 18 (12/18 0725) BP: (130-155)/(69-121) 155/69 (12/18 0725) SpO2:  [99 %-100 %] 100 % (12/18 0725) Physical Exam: General: Awake, alert, NAD. Communicates clearly. Cardio: Irregular rhythm w/ normal rate. Normal S1, S2. No murmur, rub, gallop. 2+ radial and dorsalis pedis pulses b/l w/ good capillary refill.  Resp: CTA bilaterally. No wheezes, rales, or rhonchi. Normal work of breathing on room air Abdomen: soft, non-tender, non-distended. Normoactive BS auscultated. No guarding, rigidity, or rebound.   Laboratory: Most recent CBC Lab Results  Component Value Date   WBC 12.1 (H) 11/28/2024   HGB 9.3 (L) 11/28/2024   HCT 26.7 (L) 11/28/2024   MCV 91.8 11/28/2024   PLT 294 11/28/2024   Most recent BMP    Latest Ref Rng & Units 11/28/2024    5:06 AM  BMP  Glucose 70 - 99 mg/dL 797   BUN 8 - 23 mg/dL 58   Creatinine 9.38 - 1.24 mg/dL 6.94   Sodium 864 - 854 mmol/L 135   Potassium 3.5 - 5.1 mmol/L 4.3   Chloride 98 - 111 mmol/L 103   CO2 22 - 32 mmol/L 23   Calcium  8.9 - 10.3 mg/dL 8.4    Trop 45, 46 TSH 2.48 Phos 4.2  Imaging/Diagnostic Tests:   Manon Jester, DO 11/28/2024, 9:01 AM  PGY-1, [facility] FPTS Intern pager: [phone], text pages welcome Secure chat group [facility] Teaching Service

## 2024-11-28 NOTE — Progress Notes (Signed)
°  Progress Note  Patient Name: Colin Keller Date of Encounter: 11/28/2024 [facility] Cardiologist: Redell Shallow, MD   Interval Summary   No CV complaints. Vaguely aware of palpitations at times. He is in atrial fibrillation, but is now rate controlled (80s overnight, 90s this morning). Plan for endoscopic ultrasound at [facility] later this afternoon.  Vital Signs Vitals:   11/27/24 1623 11/27/24 1944 11/28/24 0549 11/28/24 0725  BP: 130/74 133/72 (!) 147/77 (!) 155/69  Pulse: 98 99 85 87  Resp: 18 18 16 18   Temp: 98.1 F (36.7 C) 98.2 F (36.8 C) (!) 97.5 F (36.4 C) 98.1 F (36.7 C)  TempSrc: Oral  Oral   SpO2: 100% 100% 99% 100%  Weight:      Height:        Intake/Output Summary (Last 24 hours) at 11/28/2024 0944 Last data filed at 11/28/2024 0818 Gross per 24 hour  Intake 666 ml  Output 1958 ml  Net -1292 ml      11/22/2024    6:43 AM 11/17/2024    6:00 AM 11/15/2024    4:24 PM  Last 3 Weights  Weight (lbs) 148 lb 13 oz 148 lb 5.9 oz 154 lb  Weight (kg) 67.5 kg 67.3 kg 69.854 kg      Telemetry/ECG  A-fib rate controlled- Personally Reviewed  Physical Exam  GEN: No acute distress.  Lying completely supine in bed without any respiratory difficulty Neck: No JVD Cardiac: Irregular , no murmurs, rubs, or gallops.  Respiratory: Clear to auscultation bilaterally. GI: Soft, nontender, non-distended  MS: No edema  Assessment & Plan  68 year old man with history of CAD s/p CABG, PAD (bilateral moderate carotid stenosis, history of atherectomy left SFA 2023), type II DM, HLP, HTN, nephrotic syndrome/AKI, recent pulmonary embolism, presenting 11/19/2024 with biliary sepsis and common bile duct stricture, recent ERCP/sphincterotomy and common bile duct stent, cholecystostomy tube for acute cholecystitis.  Paroxysmal atrial fibrillation in the setting of acute noncardiac illness with recent invasive procedures and bleeding from sphincterotomy  site, plan for renal biopsy next week, unable to anticoagulate.  Severe renal dysfunction with a slight improving trend in creatinine over the last 4 days, baseline creatinine seems to be around 2.0 (GFR around 30) earlier this year, now 3.0.  No plans for cardioversion or antiarrhythmic medications until we can start anticoagulation.  He is well rate controlled and asymptomatic.  Continue current dose of beta-blocker.   For questions or updates, please contact [facility] Please consult [email or web address] for contact info under         Signed, Jerel Balding, MD

## 2024-11-29 DIAGNOSIS — I5020 Unspecified systolic (congestive) heart failure: Secondary | ICD-10-CM

## 2024-11-29 DIAGNOSIS — H5712 Ocular pain, left eye: Secondary | ICD-10-CM | POA: Diagnosis not present

## 2024-11-29 LAB — CBC WITH DIFFERENTIAL/PLATELET
Abs Immature Granulocytes: 0.21 K/uL — ABNORMAL HIGH (ref 0.00–0.07)
Basophils Absolute: 0 K/uL (ref 0.0–0.1)
Basophils Relative: 0 %
Eosinophils Absolute: 0.5 K/uL (ref 0.0–0.5)
Eosinophils Relative: 4 %
HCT: 29.4 % — ABNORMAL LOW (ref 39.0–52.0)
Hemoglobin: 9.8 g/dL — ABNORMAL LOW (ref 13.0–17.0)
Immature Granulocytes: 2 %
Lymphocytes Relative: 15 %
Lymphs Abs: 2 K/uL (ref 0.7–4.0)
MCH: 31.5 pg (ref 26.0–34.0)
MCHC: 33.3 g/dL (ref 30.0–36.0)
MCV: 94.5 fL (ref 80.0–100.0)
Monocytes Absolute: 1 K/uL (ref 0.1–1.0)
Monocytes Relative: 8 %
Neutro Abs: 9.8 K/uL — ABNORMAL HIGH (ref 1.7–7.7)
Neutrophils Relative %: 71 %
Platelets: 365 K/uL (ref 150–400)
RBC: 3.11 MIL/uL — ABNORMAL LOW (ref 4.22–5.81)
RDW: 15.4 % (ref 11.5–15.5)
WBC: 13.5 K/uL — ABNORMAL HIGH (ref 4.0–10.5)
nRBC: 0 % (ref 0.0–0.2)

## 2024-11-29 LAB — BASIC METABOLIC PANEL WITH GFR
Anion gap: 11 (ref 5–15)
BUN: 53 mg/dL — ABNORMAL HIGH (ref 8–23)
CO2: 22 mmol/L (ref 22–32)
Calcium: 8.8 mg/dL — ABNORMAL LOW (ref 8.9–10.3)
Chloride: 103 mmol/L (ref 98–111)
Creatinine, Ser: 2.77 mg/dL — ABNORMAL HIGH (ref 0.61–1.24)
GFR, Estimated: 24 mL/min — ABNORMAL LOW
Glucose, Bld: 141 mg/dL — ABNORMAL HIGH (ref 70–99)
Potassium: 4.5 mmol/L (ref 3.5–5.1)
Sodium: 136 mmol/L (ref 135–145)

## 2024-11-29 LAB — HEPATIC FUNCTION PANEL
ALT: 74 U/L — ABNORMAL HIGH (ref 0–44)
AST: 18 U/L (ref 15–41)
Albumin: 2.7 g/dL — ABNORMAL LOW (ref 3.5–5.0)
Alkaline Phosphatase: 704 U/L — ABNORMAL HIGH (ref 38–126)
Bilirubin, Direct: 0.6 mg/dL — ABNORMAL HIGH (ref 0.0–0.2)
Indirect Bilirubin: 0.2 mg/dL — ABNORMAL LOW (ref 0.3–0.9)
Total Bilirubin: 0.8 mg/dL (ref 0.0–1.2)
Total Protein: 5.8 g/dL — ABNORMAL LOW (ref 6.5–8.1)

## 2024-11-29 LAB — GLUCOSE, CAPILLARY
Glucose-Capillary: 123 mg/dL — ABNORMAL HIGH (ref 70–99)
Glucose-Capillary: 123 mg/dL — ABNORMAL HIGH (ref 70–99)
Glucose-Capillary: 127 mg/dL — ABNORMAL HIGH (ref 70–99)
Glucose-Capillary: 142 mg/dL — ABNORMAL HIGH (ref 70–99)
Glucose-Capillary: 46 mg/dL — ABNORMAL LOW (ref 70–99)
Glucose-Capillary: 98 mg/dL (ref 70–99)

## 2024-11-29 LAB — HEPARIN LEVEL (UNFRACTIONATED)
Heparin Unfractionated: 0.26 [IU]/mL — ABNORMAL LOW (ref 0.30–0.70)
Heparin Unfractionated: 0.36 [IU]/mL (ref 0.30–0.70)

## 2024-11-29 MED ORDER — INSULIN ASPART 100 UNIT/ML IJ SOLN
0.0000 [IU] | Freq: Three times a day (TID) | INTRAMUSCULAR | Status: DC
  Administered 2024-11-30: 2 [IU] via SUBCUTANEOUS
  Administered 2024-11-30: 7 [IU] via SUBCUTANEOUS
  Administered 2024-12-01: 2 [IU] via SUBCUTANEOUS
  Administered 2024-12-01 (×2): 7 [IU] via SUBCUTANEOUS
  Administered 2024-12-02: 5 [IU] via SUBCUTANEOUS
  Administered 2024-12-02 (×2): 9 [IU] via SUBCUTANEOUS
  Administered 2024-12-03: 3 [IU] via SUBCUTANEOUS
  Administered 2024-12-03: 5 [IU] via SUBCUTANEOUS
  Administered 2024-12-03: 3 [IU] via SUBCUTANEOUS
  Administered 2024-12-04: 7 [IU] via SUBCUTANEOUS
  Administered 2024-12-04: 2 [IU] via SUBCUTANEOUS
  Administered 2024-12-05: 5 [IU] via SUBCUTANEOUS
  Administered 2024-12-05: 2 [IU] via SUBCUTANEOUS
  Filled 2024-11-29: qty 2
  Filled 2024-11-29: qty 5
  Filled 2024-11-29: qty 3
  Filled 2024-11-29: qty 2
  Filled 2024-11-29: qty 3
  Filled 2024-11-29 (×4): qty 7
  Filled 2024-11-29: qty 3
  Filled 2024-11-29: qty 1
  Filled 2024-11-29 (×2): qty 5
  Filled 2024-11-29: qty 7
  Filled 2024-11-29: qty 2

## 2024-11-29 MED ORDER — METOCLOPRAMIDE HCL 5 MG/ML IJ SOLN
10.0000 mg | Freq: Once | INTRAMUSCULAR | Status: AC
  Administered 2024-11-29: 10 mg via INTRAVENOUS
  Filled 2024-11-29: qty 2

## 2024-11-29 MED ORDER — TETRACAINE HCL 0.5 % OP SOLN
1.0000 [drp] | Freq: Two times a day (BID) | OPHTHALMIC | Status: DC | PRN
  Filled 2024-11-29: qty 4

## 2024-11-29 MED ORDER — HYDROCODONE-ACETAMINOPHEN 10-325 MG PO TABS: 1.0000 | ORAL_TABLET | Freq: Two times a day (BID) | ORAL | 0 refills | Status: DC | PRN

## 2024-11-29 MED ORDER — DEXTROSE 50 % IV SOLN
INTRAVENOUS | Status: AC
  Administered 2024-11-29: 25 mL
  Filled 2024-11-29: qty 50

## 2024-11-29 MED ORDER — POLYETHYLENE GLYCOL 3350 17 G PO PACK
17.0000 g | PACK | Freq: Two times a day (BID) | ORAL | Status: DC
  Administered 2024-11-30 – 2024-12-05 (×6): 17 g via ORAL
  Filled 2024-11-29 (×10): qty 1

## 2024-11-29 MED ORDER — LACTULOSE 10 GM/15ML PO SOLN
20.0000 g | Freq: Once | ORAL | Status: AC
  Administered 2024-11-29: 20 g via ORAL
  Filled 2024-11-29: qty 30

## 2024-11-29 MED ORDER — INSULIN ASPART 100 UNIT/ML IJ SOLN: 0.0000 [IU] | Freq: Three times a day (TID) | INTRAMUSCULAR | Status: DC

## 2024-11-29 NOTE — Plan of Care (Signed)
" °  Problem: Education: Goal: Knowledge of General Education information will improve Description: Including pain rating scale, medication(s)/side effects and non-pharmacologic comfort measures Outcome: Progressing   Problem: Health Behavior/Discharge Planning: Goal: Ability to manage health-related needs will improve Outcome: Progressing   Problem: Clinical Measurements: Goal: Ability to maintain clinical measurements within normal limits will improve Outcome: Progressing Goal: Will remain free from infection Outcome: Progressing Goal: Diagnostic test results will improve Outcome: Progressing Goal: Respiratory complications will improve Outcome: Progressing Goal: Cardiovascular complication will be avoided Outcome: Progressing   Problem: Activity: Goal: Risk for activity intolerance will decrease Outcome: Progressing   Problem: Nutrition: Goal: Adequate nutrition will be maintained Outcome: Progressing   Problem: Coping: Goal: Level of anxiety will decrease Outcome: Progressing   Problem: Elimination: Goal: Will not experience complications related to bowel motility Outcome: Progressing Goal: Will not experience complications related to urinary retention Outcome: Progressing   Problem: Pain Managment: Goal: General experience of comfort will improve and/or be controlled Outcome: Progressing   Problem: Safety: Goal: Ability to remain free from injury will improve Outcome: Progressing   Problem: Skin Integrity: Goal: Risk for impaired skin integrity will decrease Outcome: Progressing   Problem: Education: Goal: Ability to describe self-care measures that may prevent or decrease complications (Diabetes Survival Skills Education) will improve Outcome: Progressing Goal: Individualized Educational Video(s) Outcome: Progressing   Problem: Coping: Goal: Ability to adjust to condition or change in health will improve Outcome: Progressing   Problem: Fluid  Volume: Goal: Ability to maintain a balanced intake and output will improve Outcome: Progressing   Problem: Health Behavior/Discharge Planning: Goal: Ability to identify and utilize available resources and services will improve Outcome: Progressing Goal: Ability to manage health-related needs will improve Outcome: Progressing   Problem: Metabolic: Goal: Ability to maintain appropriate glucose levels will improve Outcome: Progressing   Problem: Nutritional: Goal: Maintenance of adequate nutrition will improve Outcome: Progressing Goal: Progress toward achieving an optimal weight will improve Outcome: Progressing   Problem: Skin Integrity: Goal: Risk for impaired skin integrity will decrease Outcome: Progressing   Problem: Tissue Perfusion: Goal: Adequacy of tissue perfusion will improve Outcome: Progressing   Problem: Safety: Goal: Non-violent Restraint(s) Outcome: Progressing   Problem: Education: Goal: Knowledge of General Education information will improve Description: Including pain rating scale, medication(s)/side effects and non-pharmacologic comfort measures Outcome: Progressing   Problem: Health Behavior/Discharge Planning: Goal: Ability to manage health-related needs will improve Outcome: Progressing   Problem: Clinical Measurements: Goal: Ability to maintain clinical measurements within normal limits will improve Outcome: Progressing Goal: Will remain free from infection Outcome: Progressing Goal: Diagnostic test results will improve Outcome: Progressing Goal: Respiratory complications will improve Outcome: Progressing Goal: Cardiovascular complication will be avoided Outcome: Progressing   Problem: Activity: Goal: Risk for activity intolerance will decrease Outcome: Progressing   Problem: Nutrition: Goal: Adequate nutrition will be maintained Outcome: Progressing   Problem: Coping: Goal: Level of anxiety will decrease Outcome: Progressing    Problem: Elimination: Goal: Will not experience complications related to bowel motility Outcome: Progressing Goal: Will not experience complications related to urinary retention Outcome: Progressing   Problem: Pain Managment: Goal: General experience of comfort will improve and/or be controlled Outcome: Progressing   Problem: Safety: Goal: Ability to remain free from injury will improve Outcome: Progressing   Problem: Skin Integrity: Goal: Risk for impaired skin integrity will decrease Outcome: Progressing   "

## 2024-11-29 NOTE — Assessment & Plan Note (Addendum)
 H/h stable. Restarted heparin  at midnight, no concern for bleeding.  - Continue heparin , favor transition to DOAC prior to discharge - DVT US  negative - Hold eliquis  - AM CBC

## 2024-11-29 NOTE — Progress Notes (Signed)
 "    Daily Progress Note Intern Pager: [phone]  Patient name: Colin Keller Medical record number: [id number] Date of birth: August 15, 1956 Age: 68 y.o. Gender: male  Primary Care Provider: McDiarmid, Krystal BIRCH, MD Consultants: GI, gen surg, Nephro, Cardiology Code Status: Full  Pt Overview and Major Events to Date:  12-9 admitted 12-12 ERCP, transfer to ICU for persistent bradycardia 12-14 return to floor from ICU, IR and Nephro consulted, IR placed cholecystostomy tube 12-16 EUS performed, no biopsies taken due to food  12/17 - Afib RvR 12/18 EUS, pancreatic biopsies collected  Medical Decision Making:  68 year old man with history of recent small PE admitted with abdominal pain.  Status post ERCP for common bile duct dilatation. Status post percutaneous cholecystostomy tube placement for acute cholecystitis.  Status post endoscopic ultrasound for characterization of pancreatic mass, biopsy pathology pending.  Patient developed new asymptomatic atrial fibrillation, now stable and rate controlled on metoprolol  and diltiazem .  Now has new left eye pain likely secondary to corneal abrasion, symptomatic management with anesthetic eyedrops for now.  Overall his clinical status is reassuring with minimal abdominal pain, improving kidney function and no concern for acute clot despite being off of anticoagulation for various procedures.  We will await results from his pancreatic biopsies and upcoming kidney biopsy.  Assessment & Plan Eye pain, left Severe L eye pain and foreign body sensation since 12/18 PM. Exam reassuring, pupils equal and reactive. Suspect corneal abrasion.  -tetracaine  0.5% ophthalmic anesthetic eye drops -Fluorescein eye exam for further evaluation   Coronary artery disease involving native coronary artery of native heart without angina pectoris Atrial fibrillation with RVR (HCC) Asymptomatic. Rate controlled, still irregular. On anticoagulation currently and will be moving  forward.  -Cardiology consulted, appreciate their recs -Continue Metoprolol  50mg  BID PO -Continue dilt 120mg  daily  -Continue with rate control, conversion not indicated at this time  -Transition to DOAC prior to discharge -Continue heparin  leading up to kidney biospy Abdominal pain Biliary stricture (HCC) Biliary sepsis (HCC) LFTs and WBCs downtrending. Reassuring clinical status. Endoscopic ultrasound concerning for pancreatic mass, biopsies collected and path report pending. -GI consulted, appreciate their recommendations  -S/p successful repeat EUS w/ pancreatic biopsies collected - Pain regimen: Continue oxycodone  5mg  q6 prn -Consider spacing to q8 on 12/20 - Bowel regimen w/ miralax  bid and senna daily  -Lactulose  x 1 today - Zofran  4mg  q6 prn for nausea - Continue CTX and flagyl  until 5 days post-cholecystostomy tube placement (11-25-11/19)  -Antibiotic course to be completed today  -Further tube management per IR - General surgery following  -AM CMP, Mg AKI (acute kidney injury) Cr downtrending. Appropriate UOP.  -D/c bladder scans -Nephro consulted, appreciate their recommendations Plan for renal biopsy (tentatively 12/22) after aspirin  wash out x 5 days (starting 12/16) Strict I/Os Lab workup from Nephro initially reassuring, negative dsDNA.  - holding home lasix , hydrochlorothiazide  - AM CMP Anemia Acute pulmonary embolism (HCC) H/h stable. Restarted heparin  at midnight, no concern for bleeding.  - Continue heparin , favor transition to DOAC prior to discharge - DVT US  negative - Hold eliquis  - AM CBC T2DM (type 2 diabetes mellitus) (HCC) One episode of hypoglycemia overnight with patient NPO for procedure, asymptomatic and rapidly corrected. -Continue 20u LAI with patient's diet reintroduced today -Advance diet as tolerated today.  -Decrease SSI to moderate d/c HS correction - Hold home metformin  Chronic health problem HTN/CAD/CHF: Hold Coreg . Diltiazem  per  Afib plan.  -Continue home hydralazine  50mg  q8h BPH: Continue home tamsulosin  GERD: Continue  home Protonix   FEN/GI: Advance as tolerated to carb modified, heart healthy PPx: heparin  Dispo:pending clinical improvement  Barriers include kidney biopsy, cholecystostomy tube  Subjective:  Patient endorsing significant L eye pain, foreign body sensation, and tear production since he woke up from anesthesia yesterday afternoon. He struggled to sleep all night due to the stabbing pain in his eye any time. Feels the pain is worse when he moves his eye. Does not remember any acute scratching or injury, says the pain came on as he awoke from anesthesia.  Otherwise reports he feels well today. No abdominal pain, dyspnea or drowsiness.   Objective: Temp:  [96.7 F (35.9 C)-98.4 F (36.9 C)] 98.4 F (36.9 C) (12/19 0753) Pulse Rate:  [64-101] 97 (12/19 0753) Resp:  [13-23] 19 (12/19 0436) BP: (93-147)/(46-94) 143/94 (12/19 0753) SpO2:  [97 %-100 %] 97 % (12/19 0753) Weight:  [67.1 kg] 67.1 kg (12/18 1457) Physical Exam: General: Awake, alert, significant distress from his L eye. Communicates clearly. HEENT:  PERRLA EOMI. Anicteric sclera, conjunctiva clear. Significant tearing from the L eye. MMM.  Cardio: Irregular rhythm, rate WNL. Normal S1, S2. No murmur, rub, gallop.  Resp: CTA bilaterally. No wheezes, rales, or rhonchi. Normal work of breathing on room air Abdomen: soft, non-tender, non-distended. Percutaneous cholecystostomy tube in place.  Neuro: AOx4. No focal deficits.    Laboratory: Most recent CBC Lab Results  Component Value Date   WBC 14.5 (H) 11/28/2024   HGB 10.0 (L) 11/28/2024   HCT 30.4 (L) 11/28/2024   MCV 93.3 11/28/2024   PLT 368 11/28/2024   Most recent BMP    Latest Ref Rng & Units 11/28/2024    5:06 AM  BMP  Glucose 70 - 99 mg/dL 797   BUN 8 - 23 mg/dL 58   Creatinine 9.38 - 1.24 mg/dL 6.94   Sodium 864 - 854 mmol/L 135   Potassium 3.5 - 5.1 mmol/L 4.3    Chloride 98 - 111 mmol/L 103   CO2 22 - 32 mmol/L 23   Calcium  8.9 - 10.3 mg/dL 8.4    Alkaline phosphatase 704 AST 18 ALT 74 Bilirubin 0.8  Imaging/Diagnostic Tests:   Manon Jester, DO 11/29/2024, 8:04 AM  PGY-1, [facility] FPTS Intern pager: [phone], text pages welcome Secure chat group [facility] Teaching Service   "

## 2024-11-29 NOTE — Assessment & Plan Note (Addendum)
 One episode of hypoglycemia overnight with patient NPO for procedure, asymptomatic and rapidly corrected. -Continue 20u LAI with patient's diet reintroduced today -Advance diet as tolerated today.  -Decrease SSI to moderate d/c HS correction - Hold home metformin 

## 2024-11-29 NOTE — Progress Notes (Addendum)
 "  Progress Note  Patient Name: Colin Keller Date of Encounter: 11/29/2024 [facility] Cardiologist: Redell Shallow, MD   Interval Summary   No complaints today, no chest pain or shortness of breath.  Unaware of his atrial fibrillation.  Vital Signs Vitals:   11/28/24 1744 11/28/24 1910 11/29/24 0436 11/29/24 0753  BP: 139/64 (!) 140/68 (!) 142/80 (!) 143/94  Pulse: 78 89 88 97  Resp: 16 15 19    Temp: (!) 96.7 F (35.9 C) (!) 97.5 F (36.4 C) 98.2 F (36.8 C) 98.4 F (36.9 C)  TempSrc:  Oral  Oral  SpO2: 100% 100% 98% 97%  Weight:      Height:        Intake/Output Summary (Last 24 hours) at 11/29/2024 0939 Last data filed at 11/29/2024 0630 Gross per 24 hour  Intake 274.23 ml  Output 2450 ml  Net -2175.77 ml      11/28/2024    2:57 PM 11/22/2024    6:43 AM 11/17/2024    6:00 AM  Last 3 Weights  Weight (lbs) 148 lb 148 lb 13 oz 148 lb 5.9 oz  Weight (kg) 67.132 kg 67.5 kg 67.3 kg      Telemetry/ECG  Atrial fibrillation heart rates 80s to 90s- Personally Reviewed  Physical Exam  GEN: No acute distress.   Neck: No JVD Cardiac: IRR, no murmurs, rubs, or gallops.  Respiratory: Clear to auscultation bilaterally. GI: Soft, nontender, non-distended  MS: No edema  Patient Profile Patient with past medical history significant for CAD status post CABG 2019, PAD, recent PE, hypertension, hyperlipidemia, statin intolerant, type 2 diabetes, tobacco abuse, right subclavian artery stenosis and carotid artery disease, anemia, CKD.   Patient admitted 12/9 with biliary sepsis secondary cholecystitis s/p ERCP w/ stenting on 12/12 and percutaneous cholecystostomy by IR on 12/14. Recently diagnosed with new PE (11/15/24).  Last dose of Eliquis  12/8.  12/12-ERCP, transferred to the ICU for bradycardia.  Cardiology consulted and signed off.  12/17-new onset A-fib RVR.  Not on Eliquis  secondary to anemia requiring 1 unit PRBC. Cards reconsulted.   Assessment & Plan    New onset atrial fibrillation Likely secondary to sepsis and other noncardiac issues and recent procedures.  Rates are well-controlled less than 110 now.  He is overall unaware of his atrial fibrillation Currently on IV heparin  for PE as well.  No plans for cardioversion given on stability of anemia and current presentation.  Eventually can plan for transition to DOAC when he appears to be more stable.  Will defer timing to MD. TSH normal this admission Currently on diltiazem  120 mg, Lopressor  50 mg twice daily.  12/13 EF 50 to 55%, G3 DD.  12/17 EF 40 to 45% in the setting of RVR with difficulty to assess EF.  Suspect okay to continue for now.  Not in CHF exacerbation currently.  Bradycardia Resolved, secondary to invasive procedure that day.  Heart failure with mildly reduced EF Varying EF as described above.  Last echocardiogram demonstrated EF 40 to 45% with difficult windows.  Will treat as such for the time being.  Looks euvolemic without any complaints today. GDMT limited by underlying renal disease. Continue with hydralazine  50 mg 3 times daily, Imdur  60 mg.  At discharge likely will transition to Toprol -XL versus carvedilol .  Would wait till discharge show in case we need to titrate Lopressor  more.  Anemia Received 1 unit of blood 12/17  Biliary sepsis status post ERCP stenting/chole tube Per primary team/GI.  Underwent EGD yesterday with suspicious findings of adenocarcinoma.  __________________________________________ Chronic conditions:  CAD status post CABG 2019 Stable disease without any angina. No aspirin  given need for DOAC eventually.  On Repatha  outpatient.   PAD status post arthrectomy and angioplasty of left SFA 2023 Follows with Dr. Darron.  Has focal restenosis in the L mid mid SFA.  Plan for medical management given underlying renal disease.   Carotid artery disease Most recent Doppler showing stable 60-79 right carotid stenosis and 40 to 59% stenosis in the  left carotid artery.  Due to repeat studies in 1 year.   Hyperlipidemia Statin intolerant Most recent LDL 42. On Repatha    Type 2 diabetes Uncontrolled A1c 9.6%    For questions or updates, please contact [facility] Please consult [email or web address] for contact info under       Signed, Thom LITTIE Sluder, PA-C     Alm LELON Gaskins was seen by me today along with Thom Sluder, PA-C. I have personally performed an evaluation on this patient.  My findings are as follows:  68 y.o. male with history of CAD s/p CABG, PAD, PE, HTN, HLD, DM admitted with biliary sepsis. New onset atrial fib with RVR on 11/27/24. He has been anemic and has received 1 unit of pRBCs on 11/27/24.  Rates controlled today on oral Cardizem  and Lopressor  No chest pain or dyspnea.   Data: EKG(s) and pertinent labs, studies, etc were personally reviewed and interpreted by me:  No EKG today Tele: atrial fib, rate 80-100 bpm Labs reviewed by me Otherwise, I agree with data as outlined by the advanced practice provider.  Exam performed by me: Gen: NAD Neck: No JVD Cardiac: Irreg irreg Lungs: clear bilat Extremities: no LE edema  My Assessment and Plan:  Atrial fibrillation with RVR: Rates better controlled on Cardizem  and Lopressor  today. He remains on IV heparin . No plans for cardioversion given anemia and question of whether he will be able to tolerate a DOAC.  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 GDMT limited by renal disease. Continue hydralazine , Imdur  and Lopressor .   Signed,  Lonni Cash, MD  11/29/2024 10:44 AM   "

## 2024-11-29 NOTE — Progress Notes (Signed)
 Patient ID: Colin Keller, male   DOB: [birth year], 68 y.o.   MRN: [id number]    Progress Note   Subjective   Day # 10 CC: Admitted with acute abdominal pain, elevated LFTs, biliary obstruction and new PE  New onset A-fib with RVR 01/29/2024-currently rate controlled  IV heparin  resumed  EUS yesterday per Dr. Arville aty-with irregular masslike region identified in the pancreatic head homogeneous and more hypoechoic than the remaining pancreatic parenchyma this measured 3.3 x 2.3 cm.  There was suggestion of abutment of the portal vein, there is an intact interface between the SMA and celiac trunk suggesting lack of invasion, CBD stent in position, also noted acquired duodenal stenosis at the sweep from a inflammatory process FNA done-touch preps showing abnormal cells, final path pending Endosonographic appearance suspicious for adenocarcinoma/T2 N0 MX  CA 19-9 113/CEA normal  Patient happy to have had solid food, no complaints of abdominal pain, other than soreness at the cholecystostomy drain site Is been having pain in his left eye since coming back from the procedure yesterday says it has been tearing, is not red but he says it feels irritated and like there is a rock in there    Objective   Vital signs in last 24 hours: Temp:  [96.7 F (35.9 C)-98.4 F (36.9 C)] 98.4 F (36.9 C) (12/19 0753) Pulse Rate:  [64-97] 97 (12/19 0753) Resp:  [15-23] 19 (12/19 0436) BP: (93-147)/(46-94) 143/94 (12/19 0753) SpO2:  [97 %-100 %] 97 % (12/19 0753) Last BM Date : 11/29/24 General:    Older white male, in no acute distress, sitting up in the chair, wife at bedside Heart:  irRegular rate and rhythm; no murmurs Lungs: Respirations even and unlabored, lungs CTA bilaterally Abdomen:  Soft, nontender and nondistended. Normal bowel sounds.  Cholecystostomy tube with bilious drainage, about 30 cc in bag currently Extremities:  Without edema. Neurologic:  Alert and oriented,  grossly normal  neurologically. Psych:  Cooperative. Normal mood and affect.  Intake/Output from previous day: 12/18 0701 - 12/19 0700 In: 374.2 [I.V.:269.2; IV Piggyback:100] Out: 2450 [Urine:1650; Drains:800] Intake/Output this shift: Total I/O In: 90.6 [I.V.:90.6] Out: 1150 [Urine:800; Drains:350]  Lab Results: Recent Labs    11/28/24 0506 11/28/24 2140 11/29/24 0805  WBC 12.1* 14.5* 13.5*  HGB 9.3* 10.0* 9.8*  HCT 26.7* 30.4* 29.4*  PLT 294 368 365   BMET Recent Labs    11/27/24 0406 11/28/24 0506 11/29/24 0805  NA 139 135 136  K 4.3 4.3 4.5  CL 106 103 103  CO2 23 23 22   GLUCOSE 120* 202* 141*  BUN 66* 58* 53*  CREATININE 3.38* 3.05* 2.77*  CALCIUM  8.5* 8.4* 8.8*   LFT Recent Labs    11/29/24 0805  PROT 5.8*  ALBUMIN  2.7*  AST 18  ALT 74*  ALKPHOS 704*  BILITOT 0.8  BILIDIR 0.6*  IBILI 0.2*   PT/INR No results for input(s): LABPROT, INR in the last 72 hours.      Assessment / Plan:    #57 68 year old white male admitted 10 days ago with abdominal pain, elevated LFTs, biliary stricture, and also found to have new PE  He is status post ERCP with CBD stent on 11/22/2024, no stones seen, unable to obtain brushings due to stricture Status post cholecystostomy tube placement 11/24/2024  Patient also had postsphincterotomy oozing and required hemostasis with clip on initial attempt at EUS on 11/26/2024  Has been on ceftriaxone  and metronidazole   EUS with FNA yesterday as outlined  above concerning for pancreatic adenocarcinoma with a 2.3 x 3.3 cm lesion stage T2 N0 MX Path pending  #2 A-fib with RVR onset 11/27/2024-rate controlled at this time Heparin  resumed  #3 new PE this admission On IV heparin   #4 acute kidney injury/nephrotic syndrome-nephrology following, plan is for renal biopsy on Monday #5 uncontrolled diabetes mellitus #6 coronary artery disease  Plan; diabetic diet Continue to monitor LFTs Await final path.  I did discuss in great detail  findings at EUS yesterday with the patient and wife at bedside today, they are understand we are waiting for path to return and understand that this is likely a pancreatic malignancy. GI will follow along over the weekend, Dr. Rollin on call.  If path returns positive for adenocarcinoma will need oncology consultation and biliary surgery consultation     Principal Problem:   Abdominal pain Active Problems:   T2DM (type 2 diabetes mellitus) (HCC)   Anemia   Coronary artery disease involving native coronary artery of native heart without angina pectoris   AKI (acute kidney injury)   Acute pulmonary embolism (HCC)   Chronic health problem   Cholecystitis   Pancreatic lesion   Biliary stricture (HCC)   Abnormal finding on GI tract imaging   Anticoagulated   Elevated liver enzymes   Biliary obstruction (HCC)   Leukocytosis   Biliary sepsis (HCC)   S/P ERCP   Bleeding from sphincterotomy site   Atrial fibrillation with RVR (HCC)   Primary hypertension   Atrial fibrillation with rapid ventricular response (HCC)   Malnutrition of moderate degree   Common bile duct dilation   Eye pain, left     LOS: 10 days   Nyrah Demos PA-C 11/29/2024, 4:14 PM

## 2024-11-29 NOTE — Progress Notes (Signed)
 PHARMACY - ANTICOAGULATION CONSULT NOTE  Pharmacy Consult for IV heparin  Indication: pulmonary embolus  Allergies[1]  Patient Measurements: Height: 5' 10 (177.8 cm) Weight: 67.1 kg (148 lb) IBW/kg (Calculated) : 73 HEPARIN  DW (KG): 67.1  Vital Signs: Temp: 97.9 F (36.6 C) (12/19 1640) Temp Source: Oral (12/19 1640) BP: 130/74 (12/19 1640) Pulse Rate: 79 (12/19 1640)  Labs: Recent Labs    11/27/24 0406 11/27/24 1421 11/27/24 2341 11/28/24 0506 11/28/24 2140 11/29/24 0803 11/29/24 0805 11/29/24 1820  HGB 7.8*   < >  --  9.3* 10.0*  --  9.8*  --   HCT 23.5*   < >  --  26.7* 30.4*  --  29.4*  --   PLT 288   < >  --  294 368  --  365  --   APTT 38*  --  50*  --   --   --   --   --   HEPARINUNFRC 0.14*  --  0.22*  --   --  0.26*  --  0.36  CREATININE 3.38*  --   --  3.05*  --   --  2.77*  --    < > = values in this interval not displayed.    Estimated Creatinine Clearance: 23.6 mL/min (A) (by C-G formula based on SCr of 2.77 mg/dL (H)).  Assessment: TEGAN BURNSIDE is a 68 y.o. year old male admitted on 11/19/2024 with concern for cholecystitis s/p ERCP w/ stenting on 12/12 and percutaneous cholecystostomy by IR on 12/14. Recently diagnosed with new PE (11/15/24) and started on eliquis . Last dose eliquis  prior to admission 12/8 @ 9:30 pm. Pharmacy consulted to restart heparin  at midnight (no bolus d/t required pRBC transfusion, last given 12/12).   Heparin  level 0.26 is subtherapeutic on 1050 units/hr. Previously therapeutic at low end of goal at this rate. Level drawn 8hr after restarting with no bolus. CBC stable.  PM update: heparin  level 0.36 is therapeutic on 1100 units/hr. No issues with the infusion or bleeding reported.  Goal of Therapy:  Heparin  level 0.3-0.7 units/ml aPTT 66-102 seconds Monitor platelets by anticoagulation protocol: Yes   Plan:  Continue heparin  infusion at 1100 units/hr Check confirmatory heparin  level in 8hrs Monitor daily heparin  level,  CBC, signs/symptoms of bleeding   Thank you for allowing pharmacy to be a part of this patients care.  Rocky Slade, PharmD, BCPS Clinical Pharmacist  Please check AMION for all [facility] Pharmacy phone numbers After 10:00 PM, call Main Pharmacy [phone]      [1]  Allergies Allergen Reactions   Amlodipine  Other (See Comments)    Leg swelling   Empagliflozin  Other (See Comments)    Euglycemic DKA   Amitriptyline  Other (See Comments)    Urinary retention   Lisinopril  Other (See Comments)    Hyperkalemia    Nortriptyline  Hcl Other (See Comments)    Vivid / bad dreams   Statins     Aches in Joints    Simvastatin  Other (See Comments)    Arthralgias

## 2024-11-29 NOTE — Progress Notes (Signed)
 Patient ID: Colin Keller, male   DOB: [birth year], 68 y.o.   MRN: [id number]    Referring Physician(s): Dr. Suzann Daring, MD   Supervising Physician: Vanice Revel  Patient Status:  [facility] - In-pt  Chief Complaint:  Biliary obstruction s/p cholecystostomy tube 12/14 by Dr. Jennefer   Subjective:  Pt lying in bed comfortably. No new complaints. Chole drain continues to have larger volume output. Awaiting random renal bx with IR on Monday 12/22.  Allergies: Amlodipine , Empagliflozin , Amitriptyline , Lisinopril , Nortriptyline  hcl, Statins, and Simvastatin   Medications: Prior to Admission medications  Medication Sig Start Date End Date Taking? Authorizing Provider  APIXABAN  (ELIQUIS ) VTE STARTER PACK (10MG  AND 5MG ) Take as directed on package: start with two-5mg  tablets twice daily for 7 days. On day 8, switch to one-5mg  tablet twice daily. 11/16/24  Yes Gherghe, Costin M, MD  aspirin  81 MG tablet Take 81 mg by mouth daily.   Yes [provider]  carvedilol  (COREG ) 6.25 MG tablet Take 1 tablet (6.25 mg total) by mouth 2 (two) times daily with a meal. 11/17/24  Yes Elicia Hamlet, MD  cetirizine (ZYRTEC) 10 MG chewable tablet Chew 10 mg by mouth as needed for allergies.   Yes [provider]  diltiazem  (CARDIZEM  CD) 180 MG 24 hr capsule Take 180 mg by mouth daily. 10/29/24  Yes [provider]  Evolocumab  (REPATHA  SURECLICK) 140 MG/ML SOAJ Inject 140 mg into the skin every 14 (fourteen) days. 03/12/24  Yes Darron Deatrice LABOR, MD  furosemide  (LASIX ) 40 MG tablet Take 0.5 tablets (20 mg total) by mouth daily. 11/18/24  Yes Elicia Hamlet, MD  Glucagon  (BAQSIMI  TWO PACK) 3 MG/DOSE POWD Use as directed PRN low glucose 10/30/24  Yes McDiarmid, Krystal BIRCH, MD  hydrALAZINE  (APRESOLINE ) 50 MG tablet Take 1 tablet (50 mg total) by mouth 3 (three) times daily. 08/22/24 08/17/25 Yes McDiarmid, Krystal BIRCH, MD  hydrochlorothiazide  (HYDRODIURIL ) 12.5 MG tablet Take 1 tablet (12.5 mg total) by mouth  daily. 10/30/24  Yes McDiarmid, Krystal BIRCH, MD  Insulin  Glargine (BASAGLAR  KWIKPEN) 100 UNIT/ML DIAL AND INJECT 20 UNITS UNDER THE SKIN DAILY. Patient taking differently: Inject 10-12 Units into the skin daily. 08/19/24  Yes McDiarmid, Krystal BIRCH, MD  insulin  lispro (HUMALOG ) 100 UNIT/ML injection Inject 0.03-0.05 mLs (3-5 Units total) into the skin 3 (three) times daily with meals. 3-4 units prior to breakfast, 4-5 units prior to evening meal 08/08/24 02/04/25 Yes McDiarmid, Krystal BIRCH, MD  metFORMIN  (GLUCOPHAGE ) 500 MG tablet Take 2 tablets (1,000 mg total) by mouth 2 (two) times daily with a meal. TAKE 2 TABLETS BY MOUTH TWICE DAILY WITH A MEAL 10/23/23 11/15/25 Yes McDiarmid, Krystal BIRCH, MD  pantoprazole  (PROTONIX ) 40 MG tablet Take 1 tablet (40 mg total) by mouth daily. 11/18/24  Yes Elicia Hamlet, MD  promethazine  (PHENERGAN ) 25 MG tablet Take 1 tablet (25 mg total) by mouth every 6 (six) hours as needed for nausea or vomiting. Patient taking differently: Take 12.5-25 mg by mouth every 6 (six) hours as needed for nausea or vomiting. 09/24/24  Yes McDiarmid, Krystal BIRCH, MD  tamsulosin  (FLOMAX ) 0.4 MG CAPS capsule Take 1 capsule (0.4 mg total) by mouth daily. 02/20/24  Yes McDiarmid, Krystal BIRCH, MD  apixaban  (ELIQUIS ) 5 MG TABS tablet Take 1 tablet (5 mg total) by mouth 2 (two) times daily. To begin after completion of Eliquis  (apixaban ) VTE starter pack Patient not taking: Reported on 11/19/2024 12/17/24   Gherghe, Costin M, MD  HYDROcodone -acetaminophen  (NORCO) 10-325 MG tablet Take  1 tablet by mouth 2 (two) times daily as needed. 11/29/24   McDiarmid, Krystal BIRCH, MD     Vital Signs: BP (!) 143/94 (BP Location: Left Arm)   Pulse 97   Temp 98.4 F (36.9 C) (Oral)   Resp 19   Ht 5' 10 (1.778 m)   Wt 148 lb (67.1 kg)   SpO2 97%   BMI 21.24 kg/m   Physical Exam Vitals and nursing note reviewed.  Constitutional:      General: He is not in acute distress. HENT:     Mouth/Throat:     Mouth: Mucous membranes are moist.      Pharynx: Oropharynx is clear.  Cardiovascular:     Rate and Rhythm: Normal rate and regular rhythm.  Pulmonary:     Effort: Pulmonary effort is normal.     Breath sounds: Normal breath sounds.  Abdominal:     Palpations: Abdomen is soft.     Comments: + RUQ chole drain. ~317ml thin dark bilious output in gravity bag. Very mild surrounding ttp to the drain insertion site but no overlying abnormality. Dressed appropriately.   Musculoskeletal:     Right lower leg: No edema.     Left lower leg: No edema.  Skin:    General: Skin is warm and dry.  Neurological:     Mental Status: He is alert and oriented to person, place, and time. Mental status is at baseline.     Imaging: VAS US  LOWER EXTREMITY VENOUS (DVT) Result Date: 11/28/2024  Lower Venous DVT Study Patient Name:  CARMINE CARROZZA  Date of Exam:   11/26/2024 Medical Rec #: [id number]      Accession #:    [id number] Date of Birth: Apr 30, 1956      Patient Gender: M Patient Age:   13 years Exam Location:  [facility] Procedure:      VAS US  LOWER EXTREMITY VENOUS (DVT) Referring Phys: CARINA BROWN --------------------------------------------------------------------------------  Indications: Pulmonary embolism.  Comparison Study: No prior exam. Performing Technologist: Edilia Elden Appl  Examination Guidelines: A complete evaluation includes B-mode imaging, spectral Doppler, color Doppler, and power Doppler as needed of all accessible portions of each vessel. Bilateral testing is considered an integral part of a complete examination. Limited examinations for reoccurring indications may be performed as noted. The reflux portion of the exam is performed with the patient in reverse Trendelenburg.  +---------+---------------+---------+-----------+----------+--------------+ RIGHT    CompressibilityPhasicitySpontaneityPropertiesThrombus Aging +---------+---------------+---------+-----------+----------+--------------+ CFV      Full            No       Yes                                 +---------+---------------+---------+-----------+----------+--------------+ SFJ      Full           Yes      Yes                                 +---------+---------------+---------+-----------+----------+--------------+ FV Prox  Full                                                        +---------+---------------+---------+-----------+----------+--------------+ FV Mid   Full                                                        +---------+---------------+---------+-----------+----------+--------------+  FV DistalFull                                                        +---------+---------------+---------+-----------+----------+--------------+ PFV      Full                                                        +---------+---------------+---------+-----------+----------+--------------+ POP      Full           Yes      Yes                                 +---------+---------------+---------+-----------+----------+--------------+ PTV      Full                                                        +---------+---------------+---------+-----------+----------+--------------+ PERO     Full                                                        +---------+---------------+---------+-----------+----------+--------------+ Plaque noted in adjacent arteries.  +---------+---------------+---------+-----------+----------+--------------+ LEFT     CompressibilityPhasicitySpontaneityPropertiesThrombus Aging +---------+---------------+---------+-----------+----------+--------------+ CFV      Full           Yes      Yes                                 +---------+---------------+---------+-----------+----------+--------------+ SFJ      Full           Yes      Yes                                 +---------+---------------+---------+-----------+----------+--------------+ FV Prox  Full                                                         +---------+---------------+---------+-----------+----------+--------------+ FV Mid   Full                                                        +---------+---------------+---------+-----------+----------+--------------+ FV DistalFull                                                        +---------+---------------+---------+-----------+----------+--------------+  PFV      Full                                                        +---------+---------------+---------+-----------+----------+--------------+ POP      Full           Yes      Yes                                 +---------+---------------+---------+-----------+----------+--------------+ PTV      Full                                                        +---------+---------------+---------+-----------+----------+--------------+ PERO     Full                                                        +---------+---------------+---------+-----------+----------+--------------+     Summary: BILATERAL: - No evidence of deep vein thrombosis seen in the lower extremities, bilaterally. -No evidence of popliteal cyst, bilaterally.   *See table(s) above for measurements and observations. Electronically signed by Fonda Rim on 11/28/2024 at 7:49:10 AM.    Final    ECHOCARDIOGRAM LIMITED Result Date: 11/27/2024    ECHOCARDIOGRAM LIMITED REPORT   Patient Name:   DEVONTAYE GROUND Date of Exam: 11/27/2024 Medical Rec #:  [id number]     Height:       70.0 in Accession #:    [id number]    Weight:       148.8 lb Date of Birth:  Dec 14, 1955     BSA:          1.841 m Patient Age:    68 years      BP:           149/101 mmHg Patient Gender: M             HR:           130 bpm. Exam Location:  Inpatient Procedure: Limited Echo, Cardiac Doppler and Color Doppler (Both Spectral and            Color Flow Doppler were utilized during procedure). Indications:    Afib with RVR  History:        Patient has  prior history of Echocardiogram examinations, most                 recent 11/23/2024. PAD; Risk Factors:Dyslipidemia, Diabetes and                 Hypertension.  Sonographer:    Carmelita Hartshorn RDCS, FE, PE Referring Phys: [phone] CARINA M BROWN IMPRESSIONS  1. Left ventricular ejection fraction, by estimation, is 40 to 45%. The left ventricle has mildly decreased function. The left ventricle demonstrates global hypokinesis. There is mild concentric left ventricular hypertrophy. Left ventricular diastolic function could not be evaluated.  2. Right ventricular systolic function is moderately reduced.  3. Left atrial size was  moderately dilated.  4. The mitral valve is degenerative. Trivial mitral valve regurgitation. Severe mitral annular calcification.  5. The aortic valve is tricuspid. There is mild calcification of the aortic valve. Aortic valve sclerosis/calcification is present, without any evidence of aortic stenosis. Conclusion(s)/Recommendation(s): EF hard to assess in setting of rapid AF. FINDINGS  Left Ventricle: Left ventricular ejection fraction, by estimation, is 40 to 45%. The left ventricle has mildly decreased function. The left ventricle demonstrates global hypokinesis. There is mild concentric left ventricular hypertrophy. Left ventricular diastolic function could not be evaluated. Left ventricular diastolic function could not be evaluated due to atrial fibrillation. Right Ventricle: Right ventricular systolic function is moderately reduced. Left Atrium: Left atrial size was moderately dilated. Mitral Valve: The mitral valve is degenerative in appearance. Severe mitral annular calcification. Trivial mitral valve regurgitation. Tricuspid Valve: Tricuspid valve regurgitation is trivial. Aortic Valve: The aortic valve is tricuspid. There is mild calcification of the aortic valve. Aortic valve sclerosis/calcification is present, without any evidence of aortic stenosis. Pulmonic Valve: The pulmonic valve  was not well visualized. Venous: The inferior vena cava was not well visualized. LEFT VENTRICLE PLAX 2D LVIDd:         4.00 cm LVIDs:         3.30 cm LV PW:         1.30 cm LV IVS:        1.30 cm LVOT diam:     2.30 cm LVOT Area:     4.15 cm  LEFT ATRIUM         Index LA diam:    4.30 cm 2.34 cm/m   AORTA Ao Root diam: 3.60 cm  SHUNTS Systemic Diam: 2.30 cm Toribio Fuel MD Electronically signed by Toribio Fuel MD Signature Date/Time: 11/27/2024/1:22:06 PM    Final    US  RENAL Result Date: 11/25/2024 EXAM: US  Retroperitoneum Complete, Renal. 11/25/2024 03:04:13 PM TECHNIQUE: Real-time ultrasonography of the retroperitoneum renal was performed. COMPARISON: US  Renal 01/22/2020. CLINICAL HISTORY: Acute renal failure superimposed on stage 3 chronic kidney disease, unspecified acute renal failure type, unspecified whether stage 3a or 3b CKD. FINDINGS: LIMITATIONS: There is limited visualization of the kidney secondary to bandage. RIGHT KIDNEY/URETER: Right kidney measures 9.2 x 4.9 x 4.8 cm. Normal cortical echogenicity. No hydronephrosis. No calculus. No mass. LEFT KIDNEY/URETER: Left kidney measures 9.5 x 5.9 x 5.1 cm. Normal cortical echogenicity. No hydronephrosis. No calculus. No mass. BLADDER: Foley catheter decompresses the bladder. IMPRESSION: 1. Limited visualization of the kidneys due to overlying bandage. No hydronephrosis. Electronically signed by: Greig Pique MD 11/25/2024 08:01 PM EST RP Workstation: [id number]    Labs:  CBC: Recent Labs    11/27/24 2115 11/28/24 0506 11/28/24 2140 11/29/24 0805  WBC 12.6* 12.1* 14.5* 13.5*  HGB 9.6* 9.3* 10.0* 9.8*  HCT 28.3* 26.7* 30.4* 29.4*  PLT 316 294 368 365    COAGS: Recent Labs    11/25/24 0414 11/26/24 0440 11/27/24 0406 11/27/24 2341  APTT 66* 38* 38* 50*    BMP: Recent Labs    11/26/24 0440 11/27/24 0406 11/28/24 0506 11/29/24 0805  NA 137 139 135 136  K 3.8 4.3 4.3 4.5  CL 106 106 103 103  CO2 22 23 23 22    GLUCOSE 130* 120* 202* 141*  BUN 75* 66* 58* 53*  CALCIUM  7.9* 8.5* 8.4* 8.8*  CREATININE 3.91* 3.38* 3.05* 2.77*  GFRNONAA 16* 19* 21* 24*    LIVER FUNCTION TESTS: Recent Labs    11/25/24 0414 11/26/24 0440  11/27/24 0406 11/28/24 0506 11/29/24 0805  BILITOT 2.7* 1.5* 0.8  --  0.8  AST 168* 43* 25  --  18  ALT 199* 129* 113*  --  74*  ALKPHOS 948* 721* 786*  --  704*  PROT 5.1* 5.1* 5.3*  --  5.8*  ALBUMIN  1.6* 1.6* 2.3* 2.4* 2.7*    Assessment and Plan:  S/p percutaneous cholecystostomy with IR 12/14 - doing well and feeling improved. continued higher volume output (800 cc documented yesterday). Will continue to follow - Pt with tentative plan for IR random renal bx 12/02/24 after asa washout, will follow for this as well  Drain Location: RUQ Size: Fr size: 10 Fr Date of placement: 11/24/24  Currently to: Drain collection device: gravity 24 hour output:  Output by Drain (mL) 11/27/24 0701 - 11/27/24 1900 11/27/24 1901 - 11/28/24 0700 11/28/24 0701 - 11/28/24 1900 11/28/24 1901 - 11/29/24 0700 11/29/24 0701 - 11/29/24 1259  Biliary Tube Cholecystostomy 10 Fr. RUQ  350  800     Current examination: Flushes easily.  Insertion site unremarkable. Suture and stat lock in place. Dressed appropriately.   Plan: Continue TID flushes with 5 cc NS. Record output Q shift. Dressing changes QD or PRN if soiled.  Call IR APP or on call IR MD if difficulty flushing or sudden change in drain output.  Repeat imaging/possible drain injection once output < 10 mL/QD (excluding flush material). Consideration for drain removal if output is < 10 mL/QD (excluding flush material), pending discussion with the providing surgical service.  Discharge planning: Please contact IR APP or on call IR MD prior to patient d/c to ensure appropriate follow up plans are in place. IR scheduler will contact patient with date/time of appointment. Patient will need to flush drain QD with 5 cc NS, record  output QD, dressing changes every 2-3 days or earlier if soiled.   Percutaneous cholecystostomy drain to remain in place at least 6 weeks.   Recommend fluoroscopy with injection of the drain in IR to evaluate for patency of the cystic duct.  If the duct is patent and general surgery feels patient is stable for cholecystectomy, the drain would be removed at time of surgery.  If the duct is patent and general surgery feels patient is NEVER a candidate for cholecystectomy, drain can be capped for a trial.  If symptoms recur, then place to gravity bag again.  If trial is successful, discuss possible removal of the drain.  If trial in unsuccessful, then patient will need routine exchanges of the  chole tube about every 8-10 weeks.  Please call the IR PA at [phone] when patient is about to be discharged and we will arrange the follow up drain injection (ok to leave message).    IR will continue to follow - please call with questions or concerns.     Electronically Signed: Kimble VEAR Clas, PA-C 11/29/2024, 12:55 PM   I spent a total of 15 Minutes at the the patient's bedside AND on the patient's hospital floor or unit, greater than 50% of which was counseling/coordinating care for cholecystostomy tube follow up.

## 2024-11-29 NOTE — Assessment & Plan Note (Addendum)
 LFTs and WBCs downtrending. Reassuring clinical status. Endoscopic ultrasound concerning for pancreatic mass, biopsies collected and path report pending. -GI consulted, appreciate their recommendations  -S/p successful repeat EUS w/ pancreatic biopsies collected - Pain regimen: Continue oxycodone  5mg  q6 prn -Consider spacing to q8 on 12/20 - Bowel regimen w/ miralax  bid and senna daily  -Lactulose  x 1 today - Zofran  4mg  q6 prn for nausea - Continue CTX and flagyl  until 5 days post-cholecystostomy tube placement (11-25-11/19)  -Antibiotic course to be completed today  -Further tube management per IR - General surgery following  -AM CMP, Mg

## 2024-11-29 NOTE — Assessment & Plan Note (Addendum)
 Cr downtrending. Appropriate UOP.  -D/c bladder scans -Nephro consulted, appreciate their recommendations Plan for renal biopsy (tentatively 12/22) after aspirin  wash out x 5 days (starting 12/16) Strict I/Os Lab workup from Nephro initially reassuring, negative dsDNA.  - holding home lasix , hydrochlorothiazide  - AM CMP

## 2024-11-29 NOTE — Inpatient Diabetes Management (Signed)
 Inpatient Diabetes Program Recommendations  AACE/ADA: New Consensus Statement on Inpatient Glycemic Control (2015)  Target Ranges:  Prepandial:   less than 140 mg/dL      Peak postprandial:   less than 180 mg/dL (1-2 hours)      Critically ill patients:  140 - 180 mg/dL   Lab Results  Component Value Date   GLUCAP 142 (H) 11/29/2024   HGBA1C 9.6 (H) 11/15/2024    Review of Glycemic Control  Latest Reference Range & Units 11/28/24 14:57 11/28/24 17:39 11/28/24 18:03 11/28/24 20:53 11/29/24 07:45  Glucose-Capillary 70 - 99 mg/dL 886 (H) 35 (LL) 879 (H) 114 (H) 142 (H)  (LL): Data is critically low (H): Data is abnormally high Diabetes history: Type 2 DM Outpatient Diabetes medications: Basaglar  10-12 units every day, Metformin  1000 mg BID, Humalog  3-5 units TID Current orders for Inpatient glycemic control: Novolog  0-15 units TID, Lantus  20 units QD  Inpatient Diabetes Program Recommendations:    Consider reducing to Novolog  0-6 units TID & HS.   Thanks, Tinnie Minus, MSN, RNC-OB Diabetes Coordinator [phone] (8a-5p)

## 2024-11-29 NOTE — Plan of Care (Signed)
 Visited patient at the bedside by request of nursing given patient had continued nausea despite zofran  and reglan  and had back pain.   I visited the patient and he says that his nausea is improving and he would like to hold off on any other nausea medications for a while. He says he is ok without his oxycodone  for now.   Wife mentions that the chole drain fell off the bed and asks for assistance with this and with eye pad dressing/drops. I informed nursing of these requests and told patient that if he felt differently regarding nausea then I would put a different medication in.   Areta Saliva, MD  PGY-3 [facility]

## 2024-11-29 NOTE — Progress Notes (Signed)
 " [facility] Progress Note    Assessment/ Plan:   AKI on CKD3B Nephrotic Syndrome -baseline Cr ~1.9-2.2, followed by Dr. Rayburn OP. Has baseline nephrotic range proteinuria. Renal ultrasound without obstruction. Urine sediment relatively bland -AKI presumed to be pre-renal injury and possibly zosyn  related AKI. Zosyn  stopped. Also concern for peri-infectious GN -can hold fluids for now given that he is able to take PO, encouraged PO hydration. Low threshold to restart fluids if Cr is uptrending -Cr down to 2.77. Nonoliguric -his nephrotic syndrome is likely being driven by uncontrolled DM but I believe we should move forward with a renal biopsy while he's here. Last ASA dose 12/15--plan for biopsy next Monday with IR (req form in chart). Serologies unrevealing with the exception of ANA positive (anti centromere ab) -no indications to start renal replacement therapy -continue with bladder scans -Avoid nephrotoxic medications including NSAIDs and iodinated intravenous contrast exposure unless the latter is absolutely indicated.  Preferred narcotic agents for pain control are hydromorphone , fentanyl , and methadone. Morphine  should not be used. Avoid Baclofen  and avoid oral sodium phosphate and magnesium  citrate based laxatives / bowel preps. Continue strict Input and Output monitoring. Will monitor the patient closely with you and intervene or adjust therapy as indicated by changes in clinical status/labs   Biliary obstruction Elevated LFTs-improving -s/p ERCP with stent, s/p chole tube w/ IR -GI and gen surg following -zosyn  switched to rocephin  and flagyl  -s/p EGD 1216, EUS reattempt 12/18: pancreatic mass found, biopsied, cytology pending  HTN -hold home lasix , can titrate hydralazine  if needed  Uncontrolled DM2 with hyperglycemia -mgmt per primary service  PE -per primary service, A/C per primary -suspecting this is secondary to nephrotic syndrome?   Subjective:    Patient seen and examined bedside. No complaints, feels well. He is looking forward to his regular diet Uop ~1.6L   Objective:   BP (!) 143/94 (BP Location: Left Arm)   Pulse 97   Temp 98.4 F (36.9 C) (Oral)   Resp 19   Ht 5' 10 (1.778 m)   Wt 67.1 kg   SpO2 97%   BMI 21.24 kg/m   Intake/Output Summary (Last 24 hours) at 11/29/2024 1201 Last data filed at 11/29/2024 0630 Gross per 24 hour  Intake 274.23 ml  Output 2450 ml  Net -2175.77 ml   Weight change:   Physical Exam: Gen: NAD, resting comfortably CVS: RRR Resp: unlabored, normal wob Abd: soft, nt/nd, +chole drain Ext: no edema Neuro: awake/alert, following commands  Imaging: ECHOCARDIOGRAM LIMITED Result Date: 11/27/2024    ECHOCARDIOGRAM LIMITED REPORT   Patient Name:   Colin Keller Date of Exam: 11/27/2024 Medical Rec #:  [id number]     Height:       70.0 in Accession #:    [id number]    Weight:       148.8 lb Date of Birth:  06/02/1956     BSA:          1.841 m Patient Age:    68 years      BP:           149/101 mmHg Patient Gender: M             HR:           130 bpm. Exam Location:  Inpatient Procedure: Limited Echo, Cardiac Doppler and Color Doppler (Both Spectral and            Color Flow Doppler were utilized during procedure). Indications:  Afib with RVR  History:        Patient has prior history of Echocardiogram examinations, most                 recent 11/23/2024. PAD; Risk Factors:Dyslipidemia, Diabetes and                 Hypertension.  Sonographer:    Carmelita Hartshorn RDCS, FE, PE Referring Phys: [phone] CARINA M BROWN IMPRESSIONS  1. Left ventricular ejection fraction, by estimation, is 40 to 45%. The left ventricle has mildly decreased function. The left ventricle demonstrates global hypokinesis. There is mild concentric left ventricular hypertrophy. Left ventricular diastolic function could not be evaluated.  2. Right ventricular systolic function is moderately reduced.  3. Left atrial size was moderately  dilated.  4. The mitral valve is degenerative. Trivial mitral valve regurgitation. Severe mitral annular calcification.  5. The aortic valve is tricuspid. There is mild calcification of the aortic valve. Aortic valve sclerosis/calcification is present, without any evidence of aortic stenosis. Conclusion(s)/Recommendation(s): EF hard to assess in setting of rapid AF. FINDINGS  Left Ventricle: Left ventricular ejection fraction, by estimation, is 40 to 45%. The left ventricle has mildly decreased function. The left ventricle demonstrates global hypokinesis. There is mild concentric left ventricular hypertrophy. Left ventricular diastolic function could not be evaluated. Left ventricular diastolic function could not be evaluated due to atrial fibrillation. Right Ventricle: Right ventricular systolic function is moderately reduced. Left Atrium: Left atrial size was moderately dilated. Mitral Valve: The mitral valve is degenerative in appearance. Severe mitral annular calcification. Trivial mitral valve regurgitation. Tricuspid Valve: Tricuspid valve regurgitation is trivial. Aortic Valve: The aortic valve is tricuspid. There is mild calcification of the aortic valve. Aortic valve sclerosis/calcification is present, without any evidence of aortic stenosis. Pulmonic Valve: The pulmonic valve was not well visualized. Venous: The inferior vena cava was not well visualized. LEFT VENTRICLE PLAX 2D LVIDd:         4.00 cm LVIDs:         3.30 cm LV PW:         1.30 cm LV IVS:        1.30 cm LVOT diam:     2.30 cm LVOT Area:     4.15 cm  LEFT ATRIUM         Index LA diam:    4.30 cm 2.34 cm/m   AORTA Ao Root diam: 3.60 cm  SHUNTS Systemic Diam: 2.30 cm Toribio Fuel MD Electronically signed by Toribio Fuel MD Signature Date/Time: 11/27/2024/1:22:06 PM    Final     Labs: BMET Recent Labs  Lab 11/23/24 9791 11/24/24 0130 11/25/24 0414 11/26/24 0440 11/27/24 0406 11/28/24 0506 11/29/24 0805  NA 135 132* 132*  137 139 135 136  K 3.7 3.5 4.1 3.8 4.3 4.3 4.5  CL 100 94* 101 106 106 103 103  CO2 23 23 17* 22 23 23 22   GLUCOSE 194* 214* 296* 130* 120* 202* 141*  BUN 61* 74* 77* 75* 66* 58* 53*  CREATININE 3.73* 4.09* 3.98* 3.91* 3.38* 3.05* 2.77*  CALCIUM  8.2* 8.0* 8.0* 7.9* 8.5* 8.4* 8.8*  PHOS  --   --   --   --   --  4.2  --    CBC Recent Labs  Lab 11/25/24 0414 11/26/24 0440 11/27/24 2115 11/28/24 0506 11/28/24 2140 11/29/24 0805  WBC 14.0*   < > 12.6* 12.1* 14.5* 13.5*  NEUTROABS 11.7*  --   --   --   --  9.8*  HGB 7.9*   < > 9.6* 9.3* 10.0* 9.8*  HCT 23.2*   < > 28.3* 26.7* 30.4* 29.4*  MCV 91.0   < > 92.2 91.8 93.3 94.5  PLT 247   < > 316 294 368 365   < > = values in this interval not displayed.    Medications:     acetaminophen   650 mg Oral QID   Chlorhexidine  Gluconate Cloth  6 each Topical Daily   diltiazem   120 mg Oral Daily   feeding supplement  1 Container Oral TID BM   hydrALAZINE   50 mg Oral Q8H   insulin  aspart  0-15 Units Subcutaneous TID WC   insulin  glargine  20 Units Subcutaneous Daily   isosorbide  mononitrate  60 mg Oral Daily   lactulose  20 g Oral Once   lidocaine   3 patch Transdermal Q24H   metoprolol  tartrate  50 mg Oral BID   multivitamin with minerals  1 tablet Oral Daily   pantoprazole   40 mg Oral BID   polyethylene glycol  17 g Oral BID   senna  1 tablet Oral Daily   sodium chloride  flush  5 mL Intracatheter Q8H   sucralfate   1 g Oral BID   tamsulosin   0.4 mg Oral Daily      Ephriam Stank, MD [facility] 11/29/2024, 12:01 PM   "

## 2024-11-29 NOTE — Assessment & Plan Note (Signed)
 Severe L eye pain and foreign body sensation since 12/18 PM. Exam reassuring, pupils equal and reactive. Suspect corneal abrasion.  -tetracaine  0.5% ophthalmic anesthetic eye drops -Fluorescein eye exam for further evaluation

## 2024-11-29 NOTE — Assessment & Plan Note (Addendum)
 Asymptomatic. Rate controlled, still irregular. On anticoagulation currently and will be moving forward.  -Cardiology consulted, appreciate their recs -Continue Metoprolol  50mg  BID PO -Continue dilt 120mg  daily  -Continue with rate control, conversion not indicated at this time  -Transition to DOAC prior to discharge -Continue heparin  leading up to kidney biospy

## 2024-11-29 NOTE — Progress Notes (Signed)
 PHARMACY - ANTICOAGULATION CONSULT NOTE  Pharmacy Consult for IV heparin  Indication: pulmonary embolus  Allergies[1]  Patient Measurements: Height: 5' 10 (177.8 cm) Weight: 67.1 kg (148 lb) IBW/kg (Calculated) : 73 HEPARIN  DW (KG): 67.1  Vital Signs: Temp: 98.4 F (36.9 C) (12/19 0753) Temp Source: Oral (12/19 0753) BP: 143/94 (12/19 0753) Pulse Rate: 97 (12/19 0753)  Labs: Recent Labs    11/27/24 0406 11/27/24 1421 11/27/24 2341 11/28/24 0506 11/28/24 2140 11/29/24 0803 11/29/24 0805  HGB 7.8*   < >  --  9.3* 10.0*  --  9.8*  HCT 23.5*   < >  --  26.7* 30.4*  --  29.4*  PLT 288   < >  --  294 368  --  365  APTT 38*  --  50*  --   --   --   --   HEPARINUNFRC 0.14*  --  0.22*  --   --  0.26*  --   CREATININE 3.38*  --   --  3.05*  --   --   --    < > = values in this interval not displayed.    Estimated Creatinine Clearance: 21.4 mL/min (A) (by C-G formula based on SCr of 3.05 mg/dL (H)).  Assessment: Colin Keller is a 68 y.o. year old male admitted on 11/19/2024 with concern for cholecystitis s/p ERCP w/ stenting on 12/12 and percutaneous cholecystostomy by IR on 12/14. Recently diagnosed with new PE (11/15/24) and started on eliquis . Last dose eliquis  prior to admission 12/8 @ 9:30 pm. Pharmacy consulted to restart heparin  at midnight (no bolus d/t required pRBC transfusion, last given 12/12).   Heparin  level 0.26 is subtherapeutic on 1050 units/hr. Previously therapeutic at low end of goal at this rate. Level drawn 8hr after restarting with no bolus. CBC stable.  Goal of Therapy:  Heparin  level 0.3-0.7 units/ml aPTT 66-102 seconds Monitor platelets by anticoagulation protocol: Yes   Plan:  Increase heparin  slightly to 1100 units/hr Monitor daily heparin  level, CBC, signs/symptoms of bleeding   Thank you for allowing pharmacy to be a part of this patients care.  Jinnie Door, PharmD, BCPS, BCCP Clinical Pharmacist  Please check AMION for all [facility]  phone numbers After 10:00 PM, call Main Pharmacy [phone]     [1]  Allergies Allergen Reactions   Amlodipine  Other (See Comments)    Leg swelling   Empagliflozin  Other (See Comments)    Euglycemic DKA   Amitriptyline  Other (See Comments)    Urinary retention   Lisinopril  Other (See Comments)    Hyperkalemia    Nortriptyline  Hcl Other (See Comments)    Vivid / bad dreams   Statins     Aches in Joints    Simvastatin  Other (See Comments)    Arthralgias

## 2024-11-29 NOTE — Assessment & Plan Note (Addendum)
 HTN/CAD/CHF: Hold Coreg . Diltiazem  per Afib plan.  -Continue home hydralazine  50mg  q8h BPH: Continue home tamsulosin  GERD: Continue home Protonix 

## 2024-11-30 ENCOUNTER — Inpatient Hospital Stay (HOSPITAL_COMMUNITY)

## 2024-11-30 LAB — GLUCOSE, CAPILLARY
Glucose-Capillary: 121 mg/dL — ABNORMAL HIGH (ref 70–99)
Glucose-Capillary: 173 mg/dL — ABNORMAL HIGH (ref 70–99)
Glucose-Capillary: 155 mg/dL — ABNORMAL HIGH (ref 70–99)
Glucose-Capillary: 304 mg/dL — ABNORMAL HIGH (ref 70–99)
Glucose-Capillary: 274 mg/dL — ABNORMAL HIGH (ref 70–99)

## 2024-11-30 LAB — CBC
HCT: 31.1 % — ABNORMAL LOW (ref 39.0–52.0)
Hemoglobin: 10.4 g/dL — ABNORMAL LOW (ref 13.0–17.0)
MCH: 31.1 pg (ref 26.0–34.0)
MCHC: 33.4 g/dL (ref 30.0–36.0)
MCV: 93.1 fL (ref 80.0–100.0)
Platelets: 434 K/uL — ABNORMAL HIGH (ref 150–400)
RBC: 3.34 MIL/uL — ABNORMAL LOW (ref 4.22–5.81)
RDW: 15.3 % (ref 11.5–15.5)
WBC: 15.6 K/uL — ABNORMAL HIGH (ref 4.0–10.5)
nRBC: 0 % (ref 0.0–0.2)

## 2024-11-30 LAB — COMPREHENSIVE METABOLIC PANEL WITH GFR
ALT: 65 U/L — ABNORMAL HIGH (ref 0–44)
AST: 20 U/L (ref 15–41)
Albumin: 2.8 g/dL — ABNORMAL LOW (ref 3.5–5.0)
Alkaline Phosphatase: 678 U/L — ABNORMAL HIGH (ref 38–126)
Anion gap: 9 (ref 5–15)
BUN: 52 mg/dL — ABNORMAL HIGH (ref 8–23)
CO2: 32 mmol/L (ref 22–32)
Calcium: 9.2 mg/dL (ref 8.9–10.3)
Chloride: 101 mmol/L (ref 98–111)
Creatinine, Ser: 2.92 mg/dL — ABNORMAL HIGH (ref 0.61–1.24)
GFR, Estimated: 22 mL/min — ABNORMAL LOW
Glucose, Bld: 171 mg/dL — ABNORMAL HIGH (ref 70–99)
Potassium: 5 mmol/L (ref 3.5–5.1)
Sodium: 142 mmol/L (ref 135–145)
Total Bilirubin: 0.8 mg/dL (ref 0.0–1.2)
Total Protein: 6 g/dL — ABNORMAL LOW (ref 6.5–8.1)

## 2024-11-30 LAB — HEPARIN LEVEL (UNFRACTIONATED)
Heparin Unfractionated: 0.48 [IU]/mL (ref 0.30–0.70)
Heparin Unfractionated: 0.44 [IU]/mL (ref 0.30–0.70)

## 2024-11-30 LAB — LIPASE, BLOOD: Lipase: 14 U/L (ref 11–51)

## 2024-11-30 LAB — MAGNESIUM: Magnesium: 2.3 mg/dL (ref 1.7–2.4)

## 2024-11-30 LAB — TROPONIN T, HIGH SENSITIVITY
Troponin T High Sensitivity: 62 ng/L — ABNORMAL HIGH (ref 0–19)
Troponin T High Sensitivity: 59 ng/L — ABNORMAL HIGH (ref 0–19)

## 2024-11-30 MED ORDER — PROCHLORPERAZINE EDISYLATE 10 MG/2ML IJ SOLN
10.0000 mg | Freq: Four times a day (QID) | INTRAMUSCULAR | Status: DC
  Administered 2024-11-30: 10 mg via INTRAVENOUS
  Filled 2024-11-30: qty 2

## 2024-11-30 MED ORDER — LACTATED RINGERS IV BOLUS
1000.0000 mL | Freq: Once | INTRAVENOUS | Status: AC
  Administered 2024-11-30: 1000 mL via INTRAVENOUS

## 2024-11-30 MED ORDER — METOPROLOL TARTRATE 5 MG/5ML IV SOLN
5.0000 mg | Freq: Once | INTRAVENOUS | Status: AC
  Administered 2024-11-30: 5 mg via INTRAVENOUS
  Filled 2024-11-30: qty 5

## 2024-11-30 MED ORDER — INSULIN GLARGINE 100 UNIT/ML ~~LOC~~ SOLN
10.0000 [IU] | Freq: Every day | SUBCUTANEOUS | Status: DC
  Filled 2024-11-30: qty 0.1

## 2024-11-30 MED ORDER — DIPHENHYDRAMINE HCL 50 MG/ML IJ SOLN
25.0000 mg | Freq: Once | INTRAMUSCULAR | Status: DC
  Administered 2024-11-30: 25 mg via INTRAVENOUS
  Filled 2024-11-30: qty 1

## 2024-11-30 MED ORDER — HEPARIN (PORCINE) 25000 UT/250ML-% IV SOLN
1100.0000 [IU]/h | INTRAVENOUS | Status: AC
  Administered 2024-11-30 – 2024-12-01 (×2): 1100 [IU]/h via INTRAVENOUS
  Filled 2024-11-30: qty 250

## 2024-11-30 MED ORDER — AMIODARONE LOAD VIA INFUSION
150.0000 mg | Freq: Once | INTRAVENOUS | Status: AC
  Administered 2024-11-30: 150 mg via INTRAVENOUS
  Filled 2024-11-30: qty 83.34

## 2024-11-30 MED ORDER — METOPROLOL TARTRATE 5 MG/5ML IV SOLN
5.0000 mg | Freq: Four times a day (QID) | INTRAVENOUS | Status: DC
  Filled 2024-11-30: qty 5

## 2024-11-30 MED ORDER — AMIODARONE HCL IN DEXTROSE 360-4.14 MG/200ML-% IV SOLN
30.0000 mg/h | INTRAVENOUS | Status: DC
  Administered 2024-11-30 – 2024-12-01 (×2): 30 mg/h via INTRAVENOUS
  Filled 2024-11-30 (×2): qty 200

## 2024-11-30 MED ORDER — TRIMETHOBENZAMIDE HCL 100 MG/ML IM SOLN
200.0000 mg | Freq: Four times a day (QID) | INTRAMUSCULAR | Status: DC
  Filled 2024-11-30: qty 2

## 2024-11-30 MED ORDER — AMIODARONE HCL IN DEXTROSE 360-4.14 MG/200ML-% IV SOLN
60.0000 mg/h | INTRAVENOUS | Status: AC
  Administered 2024-11-30 (×2): 60 mg/h via INTRAVENOUS
  Filled 2024-11-30 (×3): qty 200

## 2024-11-30 MED ORDER — TRIMETHOBENZAMIDE HCL 100 MG/ML IM SOLN
200.0000 mg | Freq: Four times a day (QID) | INTRAMUSCULAR | Status: DC
  Filled 2024-11-30 (×2): qty 2

## 2024-11-30 MED ORDER — DILTIAZEM HCL 25 MG/5ML IV SOLN
20.0000 mg | Freq: Once | INTRAVENOUS | Status: AC
  Administered 2024-11-30: 20 mg via INTRAVENOUS
  Filled 2024-11-30: qty 5

## 2024-11-30 MED ORDER — HEPARIN (PORCINE) 25000 UT/250ML-% IV SOLN
INTRAVENOUS | Status: AC
  Administered 2024-11-30: 1100 [IU]/h via INTRAVENOUS
  Filled 2024-11-30: qty 250

## 2024-11-30 MED ORDER — PROCHLORPERAZINE EDISYLATE 10 MG/2ML IJ SOLN: 10.0000 mg | INTRAMUSCULAR | Status: DC | PRN

## 2024-11-30 MED ORDER — METOPROLOL TARTRATE 5 MG/5ML IV SOLN
5.0000 mg | Freq: Two times a day (BID) | INTRAVENOUS | Status: DC
  Administered 2024-11-30: 5 mg via INTRAVENOUS
  Filled 2024-11-30: qty 5

## 2024-11-30 MED ORDER — METOPROLOL TARTRATE 5 MG/5ML IV SOLN: 5.0000 mg | Freq: Two times a day (BID) | INTRAVENOUS | Status: DC

## 2024-11-30 MED ORDER — PROMETHAZINE HCL 25 MG PO TABS
25.0000 mg | ORAL_TABLET | Freq: Once | ORAL | Status: AC
  Administered 2024-11-30: 25 mg via ORAL
  Filled 2024-11-30 (×2): qty 1

## 2024-11-30 MED ORDER — TRIMETHOBENZAMIDE HCL 100 MG/ML IM SOLN: 200.0000 mg | Freq: Four times a day (QID) | INTRAMUSCULAR | Status: DC | PRN

## 2024-11-30 MED ORDER — FUROSEMIDE 10 MG/ML IJ SOLN
40.0000 mg | Freq: Once | INTRAMUSCULAR | Status: AC
  Administered 2024-11-30: 40 mg via INTRAVENOUS
  Filled 2024-11-30: qty 4

## 2024-11-30 MED ORDER — PROCHLORPERAZINE MALEATE 10 MG PO TABS: 10.0000 mg | ORAL_TABLET | Freq: Four times a day (QID) | ORAL | Status: DC | PRN

## 2024-11-30 MED ORDER — LACTATED RINGERS IV SOLN: INTRAVENOUS | Status: DC

## 2024-11-30 MED ORDER — PANTOPRAZOLE SODIUM 40 MG IV SOLR
40.0000 mg | Freq: Two times a day (BID) | INTRAVENOUS | Status: DC
  Administered 2024-11-30 – 2024-12-06 (×13): 40 mg via INTRAVENOUS
  Filled 2024-11-30 (×13): qty 10

## 2024-11-30 NOTE — Assessment & Plan Note (Addendum)
 H/h pending.  Some concern for repeat upper GI bleed given black intractable emesis. - Hold heparin , favor transition to DOAC prior to discharge - Hold eliquis  - AM CBC

## 2024-11-30 NOTE — Progress Notes (Signed)
 Heparin  drip initiated @ 11 mL/hr

## 2024-11-30 NOTE — Assessment & Plan Note (Addendum)
 LFTs and WBCs pending.  Pending biopsy results, now with intractable nausea/vomiting overnight resistant to IV Zofran  and Phenergan .  Intermittently going into nonsustained A-fib RVR with emesis.  Some concern that emesis could be secondary to upper GI bleed given black appearing emesis -GI consulted, appreciate their recommendations, will touch base regarding emesis  -S/p successful repeat EUS w/ pancreatic biopsies collected - Pain regimen: Continue oxycodone  5mg  q6 prn -Consider spacing to q8 on 12/21 - Bowel regimen w/ miralax  bid and senna daily - DC IV Zofran  as needed, start IV Tigan  200 milligrams every 6 hours with dose of IV Benadryl  25 mg - 1 L LR bolus, starting 75 mL/h maintenance fluids, can DC once tolerating better p.o. - KUB notable for stool burden no concern for SBO - Hold heparin  until CBC results - S/p 5-day course CTX and flagyl    -Further tube management per IR, adequate drain output - General surgery following  - Lipase pending - AM CMP, Mg

## 2024-11-30 NOTE — Assessment & Plan Note (Addendum)
 One episode of hypoglycemia early evening due to poor PO with N/V. - Decrease to 10u LAI due to patient's emesis and poor p.o. - Advance diet as tolerated today.  - Decrease SSI to sensitive  - Hold home metformin 

## 2024-11-30 NOTE — Progress Notes (Addendum)
 "    Daily Progress Note Intern Pager: [phone]  Patient name: Colin Keller Medical record number: [id number] Date of birth: 05-08-1956 Age: 68 y.o. Gender: male  Primary Care Provider: McDiarmid, Krystal BIRCH, MD Consultants: GI, GEN surge, nephro, cardiology Code Status: Full  Pt Overview and Major Events to Date:  12/9 - admitted 12/12 - ERCP, transfer to ICU for persistent bradycardia 12/14 - return to floor from ICU, IR and Nephro consulted, IR placed cholecystostomy tube 12/16 - EUS performed, no biopsies taken due to food  12/17 - Afib RvR 12/18 - EUS, pancreatic biopsies collected   Medical Decision Making:   68 year old man with history of recent small PE admitted for biliary stricture s/p ERCP with CBD dilatation s/p percutaneous cholecystostomy tube and also found to have pancreatic lesions s/p endoscopic ultrasound, duodenal clip, and biopsy of pancreatic lesions.  Pending results of pancreatic biopsy and at scheduled kidney biopsy 12/22 now with dark emesis.   Assessment & Plan Abdominal pain Biliary stricture (HCC) Biliary sepsis (HCC) LFTs and WBCs pending.  Pending biopsy results, now with intractable nausea/vomiting overnight resistant to IV Zofran  and Phenergan .  Intermittently going into nonsustained A-fib RVR with emesis.  Some concern that emesis could be secondary to upper GI bleed given black appearing emesis -GI consulted, appreciate their recommendations, will touch base regarding emesis  -S/p successful repeat EUS w/ pancreatic biopsies collected - Pain regimen: Continue oxycodone  5mg  q6 prn -Consider spacing to q8 on 12/21 - Bowel regimen w/ miralax  bid and senna daily - DC IV Zofran  as needed, start IV Tigan  200 milligrams every 6 hours with dose of IV Benadryl  25 mg - 1 L LR bolus, starting 75 mL/h maintenance fluids, can DC once tolerating better p.o. - KUB notable for stool burden no concern for SBO - Hold heparin  until CBC results - S/p 5-day course CTX  and flagyl    -Further tube management per IR, adequate drain output - General surgery following  - Lipase pending - AM CMP, Mg Eye pain, left Severe L eye pain and foreign body sensation since 12/18 PM immediately postprocedure. Suspect corneal abrasion.  - Continue tetracaine  0.5% ophthalmic anesthetic eye drops - Continue protective eye patch Coronary artery disease involving native coronary artery of native heart without angina pectoris Atrial fibrillation with RVR (HCC) Asymptomatic.  Intermittently in RVR with emesis.  Suspect elevated heart rate could be secondary to fall behind on pain control as he has not tolerated p.o. -Cardiology consulted, appreciate their recs - Hold metoprolol  50mg  BID PO, start IV metoprolol  5 mg every 12 hours - Continue dilt 120mg  daily - Holding heparin  due to black emesis leading up to kidney biospy, will transition to DOAC post biopsy - Consider transfer to progressive unit if persistently returning to RVR, EKG this morning reassuring with borderline QTc prolongation AKI (acute kidney injury) Cr pending. Appropriate UOP, low slightly decreased due to poor p.o.  -Nephro consulted, appreciate their recommendations Plan for renal biopsy (tentatively 12/22) after aspirin  wash out x 5 days (starting 12/16) Strict I/Os, only 200 cc urine output charted overnight - holding home lasix , hydrochlorothiazide  - AM CMP Anemia Acute pulmonary embolism (HCC) H/h pending.  Some concern for repeat upper GI bleed given black intractable emesis. - Hold heparin , favor transition to DOAC prior to discharge - Hold eliquis  - AM CBC T2DM (type 2 diabetes mellitus) (HCC) One episode of hypoglycemia early evening due to poor PO with N/V. - Decrease to 10u LAI due to patient's emesis  and poor p.o. - Advance diet as tolerated today.  - Decrease SSI to sensitive  - Hold home metformin  Chronic health problem CAD/CHF: Hold Coreg . Diltiazem  per Afib plan.  HTN: Continue  home hydralazine  50mg  q8h, BPs elevated likely 2/2 ( BPH: Continue home tamsulosin  GERD: Continue home Protonix   FEN/GI: Heart healthy PPx: Heparin  GTT Dispo: pending clinical improvement . Barriers include renal biopsy, tolerating p.o.   Subjective:  Saw patient at bedside, he was actively vomiting and reports that this has been the pattern all night.  He will get about an hour of relief before the emesis returns.  Emesis appears dark black, was initially food contents states that he ate a very big dinner and then had the Maalox immediately afterwards.  Also endorses burning and bloating feeling.  Does not endorse lightheadedness, chest pain, racing heart, palpitations, does not close feeling a little more short of breath.  Objective: Temp:  [97.6 F (36.4 C)-98.4 F (36.9 C)] 98.2 F (36.8 C) (12/19 2330) Pulse Rate:  [61-124] 77 (12/19 2330) Resp:  [19] 19 (12/19 2330) BP: (126-170)/(72-105) 126/72 (12/19 2330) SpO2:  [97 %-99 %] 97 % (12/19 2330) Physical Exam: General: Well-appearing, in mild distress, intermittently vomiting Cardiovascular: Irregular rate and irregular rhythm, no murmurs rubs or gallops Respiratory: Clear to auscultation bilaterally Abdomen: Hyperactive bowel sounds, abdomen soft, nondistended, nontender to light or deep palpation, right flank percutaneous cholecystostomy tube in place with moderate dark brown/black bilious output Extremities: Nonedematous, nontender bilateral lower extremities  Laboratory: Most recent CBC Lab Results  Component Value Date   WBC 13.5 (H) 11/29/2024   HGB 9.8 (L) 11/29/2024   HCT 29.4 (L) 11/29/2024   MCV 94.5 11/29/2024   PLT 365 11/29/2024   Most recent BMP    Latest Ref Rng & Units 11/29/2024    8:05 AM  BMP  Glucose 70 - 99 mg/dL 858   BUN 8 - 23 mg/dL 53   Creatinine 9.38 - 1.24 mg/dL 7.22   Sodium 864 - 854 mmol/L 136   Potassium 3.5 - 5.1 mmol/L 4.5   Chloride 98 - 111 mmol/L 103   CO2 22 - 32 mmol/L 22    Calcium  8.9 - 10.3 mg/dL 8.8     Other pertinent labs pending  Imaging/Diagnostic Tests: KUB IMPRESSION: 1. Biliary stent and right upper quadrant pigtail catheter in place. 2. Moderate colonic stool burden without evidence of bowel obstruction. 3. No acute findings.  Lorrane Pac, MD 11/30/2024, 12:20 AM  PGY-1, [facility] FPTS Intern pager: [phone], text pages welcome Secure chat group [facility] Teaching Service   "

## 2024-11-30 NOTE — Assessment & Plan Note (Addendum)
 Asymptomatic.  Intermittently in RVR with emesis.  Suspect elevated heart rate could be secondary to fall behind on pain control as he has not tolerated p.o. -Cardiology consulted, appreciate their recs - Hold metoprolol  50mg  BID PO, start IV metoprolol  5 mg every 12 hours - Continue dilt 120mg  daily - Holding heparin  due to black emesis leading up to kidney biospy, will transition to DOAC post biopsy - Consider transfer to progressive unit if persistently returning to RVR, EKG this morning reassuring with borderline QTc prolongation

## 2024-11-30 NOTE — Assessment & Plan Note (Addendum)
 Cr pending. Appropriate UOP, low slightly decreased due to poor p.o.  -Nephro consulted, appreciate their recommendations Plan for renal biopsy (tentatively 12/22) after aspirin  wash out x 5 days (starting 12/16) Strict I/Os, only 200 cc urine output charted overnight - holding home lasix , hydrochlorothiazide  - AM CMP

## 2024-11-30 NOTE — Plan of Care (Addendum)
 Received message from RN that patient was tachycardic to the 140s with new SOB. This MD evaluated patient at bedside. HR ~140 bpm, sating in the 90s on 2L [place]. Sleepy but arousable, is not endorsing CP or SOB. Bilateral crackles on exam.   Approximate clinical course:  7:00: Reviewed sign out that patient had intractable N/V overnight and 1x black/coffee ground emesis, unable to take PO  - held heparin  ISO concern for acute GI bleed  7:30: received call from RN that patient with elevated HR to the 140s and elevated BP, EKG with A fib wit RVR, no PO medications given N/V - 1x IV metoprlol given - transfer level of care to progressive 7:45: no improvement in HR - another 5 mg IV metoprolol  given 10 am: received message that patient was still tachycardic, repeat EKG showed A fib with RVR and now having SOB. Hg wnl  - stat CXR - Trops x2 - restart heparin   - bolus dilt 20 mg IV - Called Dr. Donley, Cardiology, who recommended starting amiodarone  drip. D/Cd IV metoprolol  order, started amio drip - called CCM for consult, concern for clinical deterioration   CXR showed mild central vascular congestion. Initial trop elevated to 62.

## 2024-11-30 NOTE — Progress Notes (Addendum)
"  °  Progress Note Patient Name: Colin Keller Date of Encounter: 11/30/2024 [facility] Cardiologist: Redell Shallow, MD   Interval Summary   No events overnight. This morning, he denies any CP or dyspnea and denied being on lasix  at home. He reported some feeling of indigestion and later this morning had recurrent episodes of emesis and HR getting up to 170s.   Vital Signs Vitals:   11/30/24 0231 11/30/24 0232 11/30/24 0427 11/30/24 0759  BP: (!) 181/98 (!) 147/88 (!) 153/75 (!) 138/92  Pulse: 82 (!) 126 82 72  Resp: 19 19 19 18   Temp: 98.5 F (36.9 C)  98.2 F (36.8 C) 98.3 F (36.8 C)  TempSrc:   Oral Oral  SpO2: 97% 97% 97% 100%  Weight:      Height:        Intake/Output Summary (Last 24 hours) at 11/30/2024 1058 Last data filed at 11/30/2024 0600 Gross per 24 hour  Intake 614.88 ml  Output 1560 ml  Net -945.12 ml      11/28/2024    2:57 PM 11/22/2024    6:43 AM 11/17/2024    6:00 AM  Last 3 Weights  Weight (lbs) 148 lb 148 lb 13 oz 148 lb 5.9 oz  Weight (kg) 67.132 kg 67.5 kg 67.3 kg      Telemetry/ECG  Afib 130s-140s - Personally Reviewed  Physical Exam  GEN: No acute distress.   Neck: No JVD Cardiac: irregularly irregular and tachycardic, no murmurs, rubs, or gallops.  Respiratory: Clear to auscultation bilaterally. GI: Soft, nontender, non-distended  MS: No edema  Assessment & Plan  Mr Rossin is a 89 yoM with Hx of CAD s/p CABG (2019), PAD s/p left SFA angioplasty (2023), CKDIV, carotid artery disease who presented biliary sepsis s/p ERCP stenting (12/12) and cholecystostomy (12/14) and hospitalization c/b PE (12/5), new onset Afib with RVR (12/17) and anemia requiring 1 units of pRBCs while on Eliquis  (12/17). Repeat TTE 12/17 40-45% (from 50-55% 12/13) in setting of Afib with RVR.  #Afib with RVR #CAD s/p CABG #PAD s/p left SFA angioplasty #CKDIV (bl 2.1-2.2) #PE on heparin  gtt - Has continued to be in Afib with RVR likely in setting of  acute illness. Given inability to tolerate PO, will do IV lopressor  5 mg Q6H; low threshold for amio gtt if patient becomes unstable. Avoid amio for now given that heparin  gtt was held this morning. - Cont diltiazem  120; can be discontinued if patient becomes unstable - Cont hydral and Imdur  for GDMT, limited by CKDIV - Limited echo prior to discharge if patient converts to NSR - We will cont to follow   For questions or updates, please contact [facility] Please consult [email or web address] for contact info under   Signed, Joelle VEAR Ren Donley, MD  "

## 2024-11-30 NOTE — Progress Notes (Signed)
 PHARMACY - ANTICOAGULATION CONSULT NOTE  Pharmacy Consult for IV heparin  Indication: pulmonary embolus  Allergies[1]  Patient Measurements: Height: 5' 10 (177.8 cm) Weight: 67.1 kg (148 lb) IBW/kg (Calculated) : 73 HEPARIN  DW (KG): 67.1  Vital Signs: Temp: 97.6 F (36.4 C) (12/20 2005) Temp Source: Oral (12/20 2005) BP: 104/83 (12/20 2005) Pulse Rate: 129 (12/20 2005)  Labs: Recent Labs    11/27/24 2341 11/28/24 0506 11/28/24 0506 11/28/24 2140 11/29/24 0803 11/29/24 0805 11/29/24 1820 11/30/24 0148 11/30/24 0938 11/30/24 2040  HGB  --  9.3*   < > 10.0*  --  9.8*  --   --  10.4*  --   HCT  --  26.7*   < > 30.4*  --  29.4*  --   --  31.1*  --   PLT  --  294   < > 368  --  365  --   --  434*  --   APTT 50*  --   --   --   --   --   --   --   --   --   HEPARINUNFRC 0.22*  --   --   --    < >  --  0.36 0.48  --  0.44  CREATININE  --  3.05*  --   --   --  2.77*  --   --  2.92*  --    < > = values in this interval not displayed.    Estimated Creatinine Clearance: 22.3 mL/min (A) (by C-G formula based on SCr of 2.92 mg/dL (H)).  Assessment: Colin Keller is a 68 y.o. year old male admitted on 11/19/2024 with concern for cholecystitis s/p ERCP w/ stenting on 12/12 and percutaneous cholecystostomy by IR on 12/14. Recently diagnosed with new PE (11/15/24) and started on eliquis . Last dose eliquis  prior to admission 12/8 @ 9:30 pm. Pharmacy consulted to restart heparin  at midnight (no bolus d/t required pRBC transfusion, last given 12/12).   12/20 AM: Heparin  drip was temporarily held this morning due to concerns for bleeding and pharmacy re-consulted to resume given stable CBC results (hemoglobin 10.4, platelets 434). Patient was previously therapeutic at infusion rate of 1100 units/hr, therefore, will resume at this rate for now and adjust as needed.   Heparin  drip 1100 uts/hr with heparin  level 0.44 at goal  No bleeding noted   Goal of Therapy:  Heparin  level 0.3-0.7  units/ml aPTT 66-102 seconds Monitor platelets by anticoagulation protocol: Yes   Plan:  Continue heparin  infusion at 1100 units/hr Monitor daily heparin  level, CBC, signs/symptoms of bleeding    Olam Chalk Pharm.D. CPP, BCPS Clinical Pharmacist [phone] 11/30/2024 9:20 PM       [1]  Allergies Allergen Reactions   Amlodipine  Other (See Comments)    Leg swelling   Empagliflozin  Other (See Comments)    Euglycemic DKA   Amitriptyline  Other (See Comments)    Urinary retention   Lisinopril  Other (See Comments)    Hyperkalemia    Nortriptyline  Hcl Other (See Comments)    Vivid / bad dreams   Statins     Aches in Joints    Simvastatin  Other (See Comments)    Arthralgias

## 2024-11-30 NOTE — Progress Notes (Incomplete)
 HR 167 bpm, MD notified. 20 mg

## 2024-11-30 NOTE — Progress Notes (Signed)
 Patient transferring to 4E Rm 3. Report given to [facility]

## 2024-11-30 NOTE — Assessment & Plan Note (Addendum)
 Severe L eye pain and foreign body sensation since 12/18 PM immediately postprocedure. Suspect corneal abrasion.  - Continue tetracaine  0.5% ophthalmic anesthetic eye drops - Continue protective eye patch

## 2024-11-30 NOTE — Assessment & Plan Note (Addendum)
 CAD/CHF: Hold Coreg . Diltiazem  per Afib plan.  HTN: Continue home hydralazine  50mg  q8h, BPs elevated likely 2/2 ( BPH: Continue home tamsulosin  GERD: Continue home Protonix 

## 2024-11-30 NOTE — Progress Notes (Signed)
 " [facility] Progress Note    Assessment/ Plan:   AKI on CKD3B Nephrotic Syndrome -baseline Cr ~1.9-2.2, followed by Dr. Rayburn OP. Has baseline nephrotic range proteinuria. Renal ultrasound without obstruction. Urine sediment relatively bland. Peak Cr ~4.1 -AKI presumed to be pre-renal injury and possibly zosyn  related AKI. Zosyn  stopped. Also concern for peri-infectious GN -can hold fluids for now given that he is able to take PO, encouraged PO hydration.  -Cr up--2.9, likely went up in the context of N/V. Remains nonoliguric. Started on IVF per primary service-stopping and giving lasix  as below -his nephrotic syndrome is likely being driven by uncontrolled DM but I believe we should move forward with a renal biopsy while he's here. Last ASA dose 12/15--plan for biopsy this coming Monday with IR (req form in chart). Serologies unrevealing with the exception of ANA positive (anti centromere ab) -no indications to start renal replacement therapy -continue with bladder scans -Avoid nephrotoxic medications including NSAIDs and iodinated intravenous contrast exposure unless the latter is absolutely indicated.  Preferred narcotic agents for pain control are hydromorphone , fentanyl , and methadone. Morphine  should not be used. Avoid Baclofen  and avoid oral sodium phosphate and magnesium  citrate based laxatives / bowel preps. Continue strict Input and Output monitoring. Will monitor the patient closely with you and intervene or adjust therapy as indicated by changes in clinical status/labs   SOB -CXR images personally reviewed: with vascular congestion -d/c'ed fluids -will give a dose of lasix  40mg  IV x 1 dose today  Biliary obstruction Elevated LFTs-improving -s/p ERCP with stent, s/p chole tube w/ IR -GI and gen surg following -zosyn  switched to rocephin  and flagyl  -s/p EGD 1216, EUS reattempt 12/18: pancreatic mass found--likely pancreatic cancer with portal vein  involvement--following. FNA results pending.  N/V -per primary service  HTN -hold home lasix , can titrate hydralazine  if needed  Uncontrolled DM2 with hyperglycemia -mgmt per primary service  PE -per primary service, A/C per primary -suspecting this is secondary to nephrotic syndrome?  Anemia -hgb 10.4 -transfuse prn for hgb <7 -would not entertain ESA at this time given possible pancreatic adenocarcinoma  Wife at bedside. Discussed with RN.  Subjective:   Patient seen and examined bedside. UOP charted: 1L with 3 unmeasured voids Had N/V yesterday and all night. Received LR bolus followed by infusion, is now SOB. Still feels nauseated and is now having  CXR being done in the room at the time of my encounter. Images personally reviewed: seems to have vascular congestion, will stop fluids. Wife at bedside. Discussed with RN. Being transferred to progressive unit 4e.   Objective:   BP (!) 138/92 (BP Location: Left Arm)   Pulse 72   Temp 98.3 F (36.8 C) (Oral)   Resp 18   Ht 5' 10 (1.778 m)   Wt 67.1 kg   SpO2 100%   BMI 21.24 kg/m   Intake/Output Summary (Last 24 hours) at 11/30/2024 1113 Last data filed at 11/30/2024 0600 Gross per 24 hour  Intake 614.88 ml  Output 1560 ml  Net -945.12 ml   Weight change:   Physical Exam: Gen: NAD, ill/tired appearing CVS: RRR Resp: slightly labored, decreased breath sounds bibasilar Abd: soft, nt/nd, +chole drain Ext: no edema Neuro: awake/alert, following commands  Imaging: DG Abd 1 View Result Date: 11/30/2024 EXAM: 1 VIEW XRAY OF THE ABDOMEN 11/30/2024 04:08:00 AM COMPARISON: CT of the abdomen and pelvis dated 11/19/2024. CLINICAL HISTORY: [id number] Vomiting [id number] Vomiting FINDINGS: LINES, TUBES AND DEVICES: Biliary stent has been placed.  Pigtail catheter is looped just beneath the liver. BOWEL: There is moderate foreign fecal material within the colon. Nonobstructive bowel gas pattern. SOFT TISSUES: Moderate vascular  calcifications. BONES: Mild levoscoliosis of the lumbar spine and multilevel chronic degenerative disc disease. Status post sternotomy. No acute fracture. IMPRESSION: 1. Biliary stent and right upper quadrant pigtail catheter in place. 2. Moderate colonic stool burden without evidence of bowel obstruction. 3. No acute findings. Electronically signed by: Evalene Coho MD 11/30/2024 04:30 AM EST RP Workstation: [id number]    Labs: BMET Recent Labs  Lab 11/24/24 0130 11/25/24 0414 11/26/24 0440 11/27/24 0406 11/28/24 0506 11/29/24 0805 11/30/24 0938  NA 132* 132* 137 139 135 136 142  K 3.5 4.1 3.8 4.3 4.3 4.5 5.0  CL 94* 101 106 106 103 103 101  CO2 23 17* 22 23 23 22  32  GLUCOSE 214* 296* 130* 120* 202* 141* 171*  BUN 74* 77* 75* 66* 58* 53* 52*  CREATININE 4.09* 3.98* 3.91* 3.38* 3.05* 2.77* 2.92*  CALCIUM  8.0* 8.0* 7.9* 8.5* 8.4* 8.8* 9.2  PHOS  --   --   --   --  4.2  --   --    CBC Recent Labs  Lab 11/25/24 0414 11/26/24 0440 11/28/24 0506 11/28/24 2140 11/29/24 0805 11/30/24 0938  WBC 14.0*   < > 12.1* 14.5* 13.5* 15.6*  NEUTROABS 11.7*  --   --   --  9.8*  --   HGB 7.9*   < > 9.3* 10.0* 9.8* 10.4*  HCT 23.2*   < > 26.7* 30.4* 29.4* 31.1*  MCV 91.0   < > 91.8 93.3 94.5 93.1  PLT 247   < > 294 368 365 434*   < > = values in this interval not displayed.    Medications:     acetaminophen   650 mg Oral QID   Chlorhexidine  Gluconate Cloth  6 each Topical Daily   diltiazem   120 mg Oral Daily   diltiazem   20 mg Intravenous Once   feeding supplement  1 Container Oral TID BM   insulin  aspart  0-9 Units Subcutaneous TID WC   isosorbide  mononitrate  60 mg Oral Daily   lidocaine   3 patch Transdermal Q24H   metoprolol  tartrate  5 mg Intravenous Q6H   multivitamin with minerals  1 tablet Oral Daily   pantoprazole  (PROTONIX ) IV  40 mg Intravenous Q12H   polyethylene glycol  17 g Oral BID   senna  1 tablet Oral Daily   sodium chloride  flush  5 mL Intracatheter Q8H    sucralfate   1 g Oral BID   tamsulosin   0.4 mg Oral Daily   trimethobenzamide   200 mg Intramuscular Q6H      Ephriam Stank, MD [facility] 11/30/2024, 11:13 AM   "

## 2024-11-30 NOTE — Plan of Care (Addendum)
 FMTS Interim Progress Note  S:Pt endorses ongoing nausea, last episode of emesis early this AM. Has only tolerated sips of water  today. He reports some shortness of breath earlier today that is now marginally improved. No pain or acute symptomatic concerns outside of nausea. Said he was offered maalox but he feels like it made his nausea worse yesterday. He reports last BM on Wednesday.   O: BP (!) 158/88 (BP Location: Left Arm)   Pulse (!) 144   Temp 98.6 F (37 C) (Oral)   Resp 16   Ht 5' 10 (1.778 m)   Wt 67.1 kg   SpO2 97%   BMI 21.24 kg/m   General: Awake, alert, NAD. Communicates clearly. Cardio: Tachycardic, irregular rhythm. Normal S1, S2. No murmur, rub, gallop. 2+ radial and dorsalis pedis pulses b/l w/ good capillary refill.  Resp: No wheezes or rhonchi. Normal work of breathing on room air. Mild R basilar crackles.  Abdomen: soft, non-tender. Mildly distended vs prior exam. No guarding, rigidity, or rebound.  Extremities: B/l LE and UE warm and well perfused. New edema over b/l foot and ankle b/l R > L.  Neuro: AOx4. No focal deficits. Conversational.   A/P: Afib RVR Patient continues to be tachycardic in the 110s-120s. New LE swelling vs prior. No chest pain, improving shortness of breath.  -Continue Amiodarone  per PCCM -Tolerated 120mg  Diltiazem  PO @1400  -Give 5mg  IV Metoprolol  if sustained HR > 120s or if clinical status worsens -Contine heparin  -Cardiology and PCCM consulting, appreciate their recommendations  N/V Likely 2/2 combination of repeated esophageal instrumentation, anesthesia, and large volume meal 12/19PM after prolonged period without solid food.  -Compazine  + Benadryl  q6 PRN -Tigan  PRN for breakthrough Full liquid diet, attempt to advance tomorrow   Manon Jester, DO 11/30/2024, 5:14 PM PGY-1, [facility] Service pager [phone]  Upper Level Attestation I agree with the history, physical, and assessment above with the  following edits:  1.Consider IV lasix  40mg  if patient appears volume overloaded on exam  Gloriann Ogren, MD PGY-2 Family Medicine Teaching Service

## 2024-11-30 NOTE — Progress Notes (Signed)
 PHARMACY - ANTICOAGULATION CONSULT NOTE  Pharmacy Consult for heparin  Indication: pulmonary embolus  Vital Signs: Temp: 98.5 F (36.9 C) (12/20 0231) Temp Source: Oral (12/20 0116) BP: 147/88 (12/20 0232) Pulse Rate: 126 (12/20 0232)  Labs: Recent Labs    11/27/24 0406 11/27/24 1421 11/27/24 2341 11/28/24 0506 11/28/24 2140 11/29/24 0803 11/29/24 0805 11/29/24 1820 11/30/24 0148  HGB 7.8*   < >  --  9.3* 10.0*  --  9.8*  --   --   HCT 23.5*   < >  --  26.7* 30.4*  --  29.4*  --   --   PLT 288   < >  --  294 368  --  365  --   --   APTT 38*  --  50*  --   --   --   --   --   --   HEPARINUNFRC 0.14*  --  0.22*  --   --  0.26*  --  0.36 0.48  CREATININE 3.38*  --   --  3.05*  --   --  2.77*  --   --    < > = values in this interval not displayed.   Assessment/Plan:  68yo male remains therapeutic on heparin . Will continue infusion at current rate of 1100 units/hr and monitor daily level.  Marvetta Dauphin, PharmD, BCPS 11/30/2024 2:38 AM

## 2024-11-30 NOTE — Plan of Care (Addendum)
 FMTS Brief Progress Note  S: to bedside for night rounds 68yo M being managed for multiple problems including PE on heparin , Afib RVR, cholecystitis s/p perc cholecystostomy and biliary stent, concern for pancreatic cancer, AKI planning for renal biopsy 12/22  Currently feeling alright, denies acute concerns Has not vomited this evening, has been able to keep down some fluids so far Denies CP/SOB Feels his HR is elevated right now because he has been trying to position himself in bed and use the urinal   O: BP 104/83 (BP Location: Left Arm)   Pulse (!) 129   Temp 97.6 F (36.4 C) (Oral)   Resp 18   Ht 5' 10 (1.778 m)   Wt 67.1 kg   SpO2 99%   BMI 21.24 kg/m   Pulse 108-120 while I was in the room and on monitor outside  Awake alert, NAD, sitting on side of bed Tachycardic, irregularly irregular Normal WOB, CTAB   A/P:  68yo M being managed for multiple problems including PE on heparin , Afib RVR, cholecystitis s/p perc cholecystostomy and biliary stent, concern for pancreatic cancer, AKI planning for renal biopsy 12/22  Afib RVR Currently rates in 100s-120s, occasionally goes higher when he's moving around. Asymptomatic. On amio gtt and PO dilt -monitor rates tonight, if sustained >110-120s while resting likely give prn IV metop. Would discuss w/ cardiology if this does not help -continue amio, PO dilt  Acute PE -Continue heparin   Remainder per today's progress note and consultant notes  - Orders reviewed. Labs for AM ordered, which was adjusted as needed.  - If condition changes, plan includes page primary team.   Romelle Booty, MD 11/30/2024, 8:07 PM PGY-3, [facility] Family Medicine Night Resident  Please page [phone] with questions.       1:41 AM Reviewed tele, HR in low 100s. Holding off on metop

## 2024-11-30 NOTE — Progress Notes (Signed)
 Diltiazem  administered, pt c/o shortness of breath. MD notified. 2 Lpm O2 administered via [place], Chest x-ray ordered. O2sat 94%

## 2024-11-30 NOTE — Progress Notes (Incomplete)
 Transferring patient to 4 east

## 2024-11-30 NOTE — Consult Note (Signed)
 "  NAME:  Colin Keller, MRN:  [id number], DOB:  [birth year], LOS: 11 ADMISSION DATE:  11/19/2024, CONSULTATION DATE:  11/30/2024 REFERRING MD:  BERNIS, CHIEF COMPLAINT: A-fib RVR    History of Present Illness:  Colin Keller is a 68 year old male with extensive past medical history significant for but not limited to HFrEF, recently diagnosed subsegmental PE12/7,  stage III CKD, type 2 diabetes, HTN, HLD, COPD, and tobacco abuse who initially presented to the ED 12/9 for complaints of abdominal pain.  He was admitted per MTS for further medical workup, see below for pertinent timeline of events  Pertinent  Medical History  HFrEF, recently diagnosed subsegmental PE12/7,  stage III CKD, type 2 diabetes, HTN, HLD, COPD, and tobacco abuse  Significant Hospital Events: Including procedures, antibiotic start and stop dates in addition to other pertinent events   12/9 presented for complaints of right upper quadrant abdominal pain, admitted per IMTS. MRCP with distal biliary stricture with obstruction 12/20 multiple episodes of vomiting overnight with resultant elevation in A-fib RVR.  PCCM consulted for assistance in management given poorly controlled A-fib and concerns for evolving pulmonary edema  Interim History / Subjective:  Patient seen sitting up in bed with ongoing complaints of indigestion and some chest tightness  Objective    Blood pressure (!) 161/100, pulse 88, temperature 98.3 F (36.8 C), temperature source Oral, resp. rate 18, height 5' 10 (1.778 m), weight 67.1 kg, SpO2 100%.        Intake/Output Summary (Last 24 hours) at 11/30/2024 1324 Last data filed at 11/30/2024 0600 Gross per 24 hour  Intake 614.88 ml  Output 1560 ml  Net -945.12 ml   Filed Weights   11/22/24 0643 11/28/24 1457  Weight: 67.5 kg 67.1 kg    Examination: General: Acute on chronic ill-appearing thin elderly male sitting up in bed in no acute distress HEENT: [place]/AT, MM pink/moist, PERRL,  Neuro: Alert  and oriented x 3, no CV: s1s2 regular rate and rhythm, no murmur, rubs, or gallops,  PULM: Focal clear to auscultation bilaterally, slightly diminished bases, no increased work of breathing, on 2 L nasal cannula GI: soft, bowel sounds active in all 4 quadrants, non-tender, non-distended, tolerating oral diet Extremities: warm/dry, no edema  Skin: no rashes or lesions   Resolved problem list   Assessment and Plan  A-fib RVR - Patient was initially rate controlled on Dital exam and Lopressor  however overnight 12/20 patient experienced persistent refractory nausea and vomiting and was unable to take oral medications resulting in worsening RVR P: Cardiology following, appreciate assistance Agree with initiating amiodarone  infusion Continuous telemetry Can consider as needed IV Lopressor  if rate remains uncontrolled on amiodarone  No acute indications to move to ICU at this time given hemodynamic stability  Acute cholecystitis with biliary stricture High concern for pancreatic cancer  Refractory nausea and vomiting P: Surgery and GI following, appreciate assistance Follow-up additional workup including FNA Supportive care As needed antiemetics Full liquid diet  History of CAD s/p CABG HFrEF - Limited echo 12/17 with EF 40 to 45% and global hypokinesis P: Primary management per cardiology  Continuous telemetry  ASA and statin Strict intake and output  Daily weight to assess volume status Closely monitor renal function and electrolytes  Ensure hemodynamic control  GDMT including diuretics limited to by multisystem organ involvement including AKI  Acute kidney injury superimposed on CKD stage IIIb Nephrotic syndrome P: Nephrology following, appreciate assistance Follow renal function Monitor urine output Trend Bmet Avoid nephrotoxins Ensure  adequate renal perfusion  Pending renal biopsy  Anemia P: Trend CBC Transfuse per protocol Hemoglobin goal greater than  7  Acute pulmonary embolus -Faint nonocclusive left lower lobe PE seen on CTA chest 12/5 P: Continue heparin  drip  Type 2 diabetes P: SSI CBG checks every 4 CBG goal 140-180   Labs   CBC: Recent Labs  Lab 11/25/24 0414 11/26/24 0440 11/27/24 2115 11/28/24 0506 11/28/24 2140 11/29/24 0805 11/30/24 0938  WBC 14.0*   < > 12.6* 12.1* 14.5* 13.5* 15.6*  NEUTROABS 11.7*  --   --   --   --  9.8*  --   HGB 7.9*   < > 9.6* 9.3* 10.0* 9.8* 10.4*  HCT 23.2*   < > 28.3* 26.7* 30.4* 29.4* 31.1*  MCV 91.0   < > 92.2 91.8 93.3 94.5 93.1  PLT 247   < > 316 294 368 365 434*   < > = values in this interval not displayed.    Basic Metabolic Panel: Recent Labs  Lab 11/26/24 0440 11/27/24 0406 11/28/24 0506 11/29/24 0805 11/30/24 0938  NA 137 139 135 136 142  K 3.8 4.3 4.3 4.5 5.0  CL 106 106 103 103 101  CO2 22 23 23 22  32  GLUCOSE 130* 120* 202* 141* 171*  BUN 75* 66* 58* 53* 52*  CREATININE 3.91* 3.38* 3.05* 2.77* 2.92*  CALCIUM  7.9* 8.5* 8.4* 8.8* 9.2  MG  --   --   --   --  2.3  PHOS  --   --  4.2  --   --    GFR: Estimated Creatinine Clearance: 22.3 mL/min (A) (by C-G formula based on SCr of 2.92 mg/dL (H)). Recent Labs  Lab 11/28/24 0506 11/28/24 2140 11/29/24 0805 11/30/24 0938  WBC 12.1* 14.5* 13.5* 15.6*    Liver Function Tests: Recent Labs  Lab 11/25/24 0414 11/26/24 0440 11/27/24 0406 11/28/24 0506 11/29/24 0805 11/30/24 0938  AST 168* 43* 25  --  18 20  ALT 199* 129* 113*  --  74* 65*  ALKPHOS 948* 721* 786*  --  704* 678*  BILITOT 2.7* 1.5* 0.8  --  0.8 0.8  PROT 5.1* 5.1* 5.3*  --  5.8* 6.0*  ALBUMIN  1.6* 1.6* 2.3* 2.4* 2.7* 2.8*   Recent Labs  Lab 11/26/24 1100 11/30/24 0938  LIPASE <10* 14   No results for input(s): AMMONIA in the last 168 hours.  ABG    Component Value Date/Time   PHART 7.307 (L) 08/23/2018 1726   PCO2ART 38.1 08/23/2018 1726   PO2ART 89.0 08/23/2018 1726   HCO3 23.7 11/22/2024 1734   TCO2 22 11/22/2024  1505   ACIDBASEDEF 1.8 11/22/2024 1734   O2SAT 49.6 11/22/2024 1734     Coagulation Profile: No results for input(s): INR, PROTIME in the last 168 hours.  Cardiac Enzymes: No results for input(s): CKTOTAL, CKMB, CKMBINDEX, TROPONINI in the last 168 hours.  HbA1C: HbA1c, POC (controlled diabetic range)  Date/Time Value Ref Range Status  08/22/2024 09:44 AM 9.5 (A) 0.0 - 7.0 % Final  02/20/2024 10:15 AM 10.1 (A) 0.0 - 7.0 % Final   Hgb A1c MFr Bld  Date/Time Value Ref Range Status  11/15/2024 11:52 PM 9.6 (H) 4.8 - 5.6 % Final    Comment:    (NOTE) Diagnosis of Diabetes The following HbA1c ranges recommended by the American Diabetes Association (ADA) may be used as an aid in the diagnosis of diabetes mellitus.  Hemoglobin  Suggested A1C NGSP%              Diagnosis  <5.7                   Non Diabetic  5.7-6.4                Pre-Diabetic  >6.4                   Diabetic  <7.0                   Glycemic control for                       adults with diabetes.      CBG: Recent Labs  Lab 11/29/24 2109 11/29/24 2332 11/30/24 0429 11/30/24 0801 11/30/24 1109  GLUCAP 123* 98 121* 173* 155*    Review of Systems:   Please see the history of present illness. All other systems reviewed and are negative    Past Medical History:  He,  has a past medical history of Acute diastolic heart failure (HCC) (90/92/7980), AKI (acute kidney injury) (10/04/2023), Atherosclerosis of both carotid arteries (08/18/2020), Bilateral carotid artery stenosis (08/18/2020), CHF (congestive heart failure) (HCC), Chronic congestive heart failure (HCC) (10/04/2023), Chronic left shoulder pain (03/18/2021), Chronic low back pain without sciatica (10/16/2008), CKD (chronic kidney disease), stage III (HCC), Coronary artery disease, Coronary artery disease involving native heart with angina pectoris, unspecified vessel or lesion type (08/10/2023), COVID (10/04/2023), Diabetes  mellitus, DKA (diabetic ketoacidosis) (HCC) (10/03/2023), Dyspnea, Fall (10/04/2023), GERD (gastroesophageal reflux disease), CABG (08/23/2018), Hyperlipidemia, Hypertension, Hypertension associated with diabetes (HCC) (12/22/2008), Hypertensive nephropathy (08/18/2020), Insulin  dependent type 2 diabetes mellitus (HCC) (12/22/2020), Left ventricular dysfunction (08/18/2020), Long term (current) use of antithrombotics/antiplatelets (09/21/2022), Peripheral artery disease (08/31/2021), Proteinuria (08/18/2020), 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   Surgical History:   Past Surgical History:  Procedure Laterality Date   ABDOMINAL AORTOGRAM W/LOWER EXTREMITY N/A 09/21/2022   Procedure: ABDOMINAL AORTOGRAM W/LOWER EXTREMITY;  Surgeon: Darron Deatrice LABOR, MD;  Location: [facility] INVASIVE CV LAB;  Service: Cardiovascular;  Laterality: N/A;   BILIARY STENT PLACEMENT  11/22/2024   Procedure: INSERTION, STENT, BILE DUCT;  Surgeon: Abran Norleen SAILOR, MD;  Location: [facility] ENDOSCOPY;  Service: Gastroenterology;;   CORONARY ARTERY BYPASS GRAFT N/A 08/23/2018   Procedure: CORONARY ARTERY BYPASS GRAFTING (CABG) x 3; -Left Internal Mammary Artery to Left Anterior Descending Artery, -Right Internal Mammary Artery to Right Coronary Artery, -Saphenous Vein Graft to Obtuse Marginal;  ENDOSCOPIC HARVEST GREATER SAPHENOUS VEIN  -Right Thigh;  Surgeon: Dusty Sudie DEL, MD;  Location: [facility] OR;  Service: Open Heart Surgery;  Laterality: N/A;   CORONARY PRESSURE/FFR STUDY N/A 08/20/2018   Procedure: INTRAVASCULAR PRESSURE WIRE/FFR STUDY;  Surgeon: Wonda Sharper, MD;  Location: [facility] INVASIVE CV LAB;  Service: Cardiovascular;  Laterality: N/A;   ERCP N/A 11/22/2024   Procedure: ERCP, WITH INTERVENTION IF INDICATED;  Surgeon: Abran Norleen SAILOR, MD;  Location: [facility] ENDOSCOPY;  Service:  Gastroenterology;  Laterality: N/A;   ESOPHAGOGASTRODUODENOSCOPY N/A 11/26/2024   Procedure: EGD (ESOPHAGOGASTRODUODENOSCOPY);  Surgeon: Wilhelmenia Aloha Raddle., MD;  Location: [facility] ENDOSCOPY;  Service: Gastroenterology;  Laterality: N/A;   HEMOSTASIS CLIP PLACEMENT  11/26/2024   Procedure: CONTROL OF HEMORRHAGE, GI TRACT, ENDOSCOPIC, BY CLIPPING OR OVERSEWING;  Surgeon: Mansouraty, Aloha Raddle., MD;  Location: [facility] ENDOSCOPY;  Service: Gastroenterology;;   IR PERC CHOLECYSTOSTOMY  11/24/2024   LEFT HEART CATH AND CORONARY ANGIOGRAPHY N/A 08/20/2018   Procedure: LEFT HEART CATH AND CORONARY ANGIOGRAPHY;  Surgeon: Wonda Sharper, MD;  Location: [facility] INVASIVE CV LAB;  Service: Cardiovascular;  Laterality: N/A;   PERIPHERAL VASCULAR ATHERECTOMY Left 09/21/2022   Procedure: PERIPHERAL VASCULAR ATHERECTOMY;  Surgeon: Darron Deatrice LABOR, MD;  Location: [facility] INVASIVE CV LAB;  Service: Cardiovascular;  Laterality: Left;  SFA   PERIPHERAL VASCULAR BALLOON ANGIOPLASTY Left 09/21/2022   Procedure: PERIPHERAL VASCULAR BALLOON ANGIOPLASTY;  Surgeon: Darron Deatrice LABOR, MD;  Location: [facility] INVASIVE CV LAB;  Service: Cardiovascular;  Laterality: Left;  SFA   SPHINCTEROTOMY  11/22/2024   Procedure: SPHINCTEROTOMY, BILIARY;  Surgeon: Abran Norleen SAILOR, MD;  Location: [facility] ENDOSCOPY;  Service: Gastroenterology;;   TEE WITHOUT CARDIOVERSION N/A 08/23/2018   Procedure: TRANSESOPHAGEAL ECHOCARDIOGRAM (TEE);  Surgeon: Dusty Sudie DEL, MD;  Location: [facility] OR;  Service: Open Heart Surgery;  Laterality: N/A;     Social History:   reports that he has been smoking cigarettes. He started smoking about 54 years ago. He has a 48 pack-year smoking history. He has never used smokeless tobacco. He reports that he does not drink alcohol  and does not use drugs.   Family History:  His family history includes Diabetes in his mother; Drug abuse in his son; Heart disease in his father and mother; Heart failure in his mother; Sudden death in his  brother.   Allergies Allergies[1]   Home Medications  Prior to Admission medications  Medication Sig Start Date End Date Taking? Authorizing Provider  APIXABAN  (ELIQUIS ) VTE STARTER PACK (10MG  AND 5MG ) Take as directed on package: start with two-5mg  tablets twice daily for 7 days. On day 8, switch to one-5mg  tablet twice daily. 11/16/24  Yes Gherghe, Costin M, MD  aspirin  81 MG tablet Take 81 mg by mouth daily.   Yes [provider]  carvedilol  (COREG ) 6.25 MG tablet Take 1 tablet (6.25 mg total) by mouth 2 (two) times daily with a meal. 11/17/24  Yes Elicia Hamlet, MD  cetirizine (ZYRTEC) 10 MG chewable tablet Chew 10 mg by mouth as needed for allergies.   Yes [provider]  diltiazem  (CARDIZEM  CD) 180 MG 24 hr capsule Take 180 mg by mouth daily. 10/29/24  Yes [provider]  Evolocumab  (REPATHA  SURECLICK) 140 MG/ML SOAJ Inject 140 mg into the skin every 14 (fourteen) days. 03/12/24  Yes Darron Deatrice LABOR, MD  furosemide  (LASIX ) 40 MG tablet Take 0.5 tablets (20 mg total) by mouth daily. 11/18/24  Yes Elicia Hamlet, MD  Glucagon  (BAQSIMI  TWO PACK) 3 MG/DOSE POWD Use as directed PRN low glucose 10/30/24  Yes McDiarmid, Krystal BIRCH, MD  hydrALAZINE  (APRESOLINE ) 50 MG tablet Take 1 tablet (50 mg total) by mouth 3 (three) times daily. 08/22/24 08/17/25 Yes McDiarmid, Krystal BIRCH, MD  hydrochlorothiazide  (HYDRODIURIL ) 12.5 MG tablet Take 1 tablet (12.5 mg total) by mouth daily. 10/30/24  Yes McDiarmid, Krystal BIRCH, MD  Insulin  Glargine (BASAGLAR  KWIKPEN) 100 UNIT/ML DIAL AND INJECT 20 UNITS UNDER THE SKIN DAILY. Patient taking differently: Inject 10-12 Units into the skin daily. 08/19/24  Yes McDiarmid, Krystal BIRCH, MD  insulin  lispro (HUMALOG ) 100 UNIT/ML injection Inject 0.03-0.05 mLs (3-5 Units total) into the skin 3 (three) times daily with meals. 3-4 units prior to breakfast, 4-5 units prior to evening meal 08/08/24 02/04/25 Yes McDiarmid, Krystal BIRCH, MD  metFORMIN  (GLUCOPHAGE ) 500 MG tablet Take 2  tablets (1,000 mg total) by  mouth 2 (two) times daily with a meal. TAKE 2 TABLETS BY MOUTH TWICE DAILY WITH A MEAL 10/23/23 11/15/25 Yes McDiarmid, Krystal BIRCH, MD  pantoprazole  (PROTONIX ) 40 MG tablet Take 1 tablet (40 mg total) by mouth daily. 11/18/24  Yes Elicia Hamlet, MD  promethazine  (PHENERGAN ) 25 MG tablet Take 1 tablet (25 mg total) by mouth every 6 (six) hours as needed for nausea or vomiting. Patient taking differently: Take 12.5-25 mg by mouth every 6 (six) hours as needed for nausea or vomiting. 09/24/24  Yes McDiarmid, Krystal BIRCH, MD  tamsulosin  (FLOMAX ) 0.4 MG CAPS capsule Take 1 capsule (0.4 mg total) by mouth daily. 02/20/24  Yes McDiarmid, Krystal BIRCH, MD  apixaban  (ELIQUIS ) 5 MG TABS tablet Take 1 tablet (5 mg total) by mouth 2 (two) times daily. To begin after completion of Eliquis  (apixaban ) VTE starter pack Patient not taking: Reported on 11/19/2024 12/17/24   Gherghe, Costin M, MD  HYDROcodone -acetaminophen  (NORCO) 10-325 MG tablet Take 1 tablet by mouth 2 (two) times daily as needed. 11/29/24   McDiarmid, Krystal BIRCH, MD     Critical care time: NA  Makya Yurko D. Harris, NP-C [facility] Personal contact information can be found on Amion  If no contact or response made please call [phone] 11/30/2024, 3:55 PM             [1]  Allergies Allergen Reactions   Amlodipine  Other (See Comments)    Leg swelling   Empagliflozin  Other (See Comments)    Euglycemic DKA   Amitriptyline  Other (See Comments)    Urinary retention   Lisinopril  Other (See Comments)    Hyperkalemia    Nortriptyline  Hcl Other (See Comments)    Vivid / bad dreams   Statins     Aches in Joints    Simvastatin  Other (See Comments)    Arthralgias   "

## 2024-11-30 NOTE — Plan of Care (Signed)

## 2024-11-30 NOTE — Progress Notes (Signed)
 At 1930, patient vomiting.  PRN Zofran  given at 1934 with minimal relief.  On Telemetry, patient was AFIB - as the night progressed, his HR would go as high as 170's, especially when vomiting.  Even when not vomiting, his HR would range between 111 to 145.  Patient asymptomatic to HR.  At 2133, Family Medicine notified that patient continues to vomit.  He also has elevated BP and elevated HR.  His CBG dropped to 46, treated with 1/2 Amp D50 @ 2038, and CBG up to 123.  Also, patient feels unable to take any of his scheduled PO medications including metoprolol  and hydralazine .  Order received for IV Reglan  and given at 2213 with short term relief.  At 2322, Family Medicine made aware that patient is unable to lay down d/t this making his N/V worse.  Also, he is having persistent lower back pain (possibly from prior injury according to wife) and rates as 9/10.  Pain persists no matter what position he is in.  MD came to patient's room to assess.  Patient is refusing any additional medications at this time.  Patient vomited again at approximately 0115.  Wife asked for phenergan .  Family Medicine made aware of request.  Order received for PO phenergan  but refused d/t nausea feeling better even when encouraged to take to keep the nausea from returning.  EKG done and showed AFIB with RVR.  Patient vomited again at PO Phenergan  given @ 0311.  At 0334, the patient vomited again.  Wife is worried about the decreased output in the biliary drain bag and is also worried about a possible SBO.  BS are decreased.  Per patient, his last BM was 11/27/2024 and he is passing minimal flatus.  MD made aware.  Order received for Abdominal Xray.  HR continues to fluctuate with HR going as high as 170s.  CBGs checked periodically and remained stable.  Patient was in Yellow MEWS d/t HR during this time.  Yellow MEWS Protocol was in place during this time and MD aware.  MD rounded after 0600.  Multiple orders received and implemented.   Suzen Ice RN

## 2024-11-30 NOTE — Progress Notes (Signed)
 "   2 Days Post-Op  Subjective: Patient underwent an EUS two days ago, which showed a uT2N0 pancreatic head mass with apparent abutment of the portal vein. FNA is pending but findings are consistent with a malignancy. This morning he reports significant indigestion and vomiting. Remains in a-fib and tachycardic. Lipase is normal, LFTs continue to downtrend. WBC 15.    Objective: Vital signs in last 24 hours: Temp:  [97.6 F (36.4 C)-98.5 F (36.9 C)] 98.3 F (36.8 C) (12/20 1200) Pulse Rate:  [61-126] 88 (12/20 1200) Resp:  [18-19] 18 (12/20 1200) BP: (126-181)/(72-105) 161/100 (12/20 1200) SpO2:  [97 %-100 %] 100 % (12/20 1200) Last BM Date : 11/27/24  Intake/Output from previous day: 12/19 0701 - 12/20 0700 In: 614.9 [P.O.:360; I.V.:254.9] Out: 1560 [Urine:1000; Drains:560] Intake/Output this shift: No intake/output data recorded.  PE: General: resting comfortably, appears uncomfortable Neuro: alert and oriented, no focal deficits Resp: normal work of breathing CV: tachycardic 110s, irregular Abdomen: soft, nondistended, minimally tender to palpation. Perc chole with serosanguinous, nonbilious fluid. Extremities: warm and well-perfused   Lab Results:  Recent Labs    11/29/24 0805 11/30/24 0938  WBC 13.5* 15.6*  HGB 9.8* 10.4*  HCT 29.4* 31.1*  PLT 365 434*   BMET Recent Labs    11/29/24 0805 11/30/24 0938  NA 136 142  K 4.5 5.0  CL 103 101  CO2 22 32  GLUCOSE 141* 171*  BUN 53* 52*  CREATININE 2.77* 2.92*  CALCIUM  8.8* 9.2   PT/INR No results for input(s): LABPROT, INR in the last 72 hours. CMP     Component Value Date/Time   NA 142 11/30/2024 0938   NA 137 10/30/2024 1126   K 5.0 11/30/2024 0938   CL 101 11/30/2024 0938   CO2 32 11/30/2024 0938   GLUCOSE 171 (H) 11/30/2024 0938   BUN 52 (H) 11/30/2024 0938   BUN 38 (H) 10/30/2024 1126   CREATININE 2.92 (H) 11/30/2024 0938   CREATININE 1.28 11/24/2023 0000   CALCIUM  9.2 11/30/2024 0938    PROT 6.0 (L) 11/30/2024 0938   PROT 6.2 06/11/2024 1046   ALBUMIN  2.8 (L) 11/30/2024 0938   ALBUMIN  3.6 (L) 06/11/2024 1046   AST 20 11/30/2024 0938   ALT 65 (H) 11/30/2024 0938   ALKPHOS 678 (H) 11/30/2024 0938   BILITOT 0.8 11/30/2024 0938   BILITOT <0.2 06/11/2024 1046   GFRNONAA 22 (L) 11/30/2024 0938   GFRNONAA 62 08/12/2015 0943   GFRAA 55 (L) 12/10/2020 1643   GFRAA 71 08/12/2015 0943   Lipase     Component Value Date/Time   LIPASE 14 11/30/2024 0938       Studies/Results: DG CHEST PORT 1 VIEW Result Date: 11/30/2024 CLINICAL DATA:  Shortness of breath. EXAM: PORTABLE CHEST 1 VIEW COMPARISON:  Chest [place]  dated 11/15/2024. FINDINGS: No focal consolidation, pleural effusion or pneumothorax. Mild central vascular congestion. The cardiac silhouette. Median sternotomy wires. No acute osseous pathology. IMPRESSION: Mild central vascular congestion. No focal consolidation. Electronically Signed   By: Vanetta Chou M.D.   On: 11/30/2024 12:27   DG Abd 1 View Result Date: 11/30/2024 EXAM: 1 VIEW XRAY OF THE ABDOMEN 11/30/2024 04:08:00 AM COMPARISON: CT of the abdomen and pelvis dated 11/19/2024. CLINICAL HISTORY: [id number] Vomiting [id number] Vomiting FINDINGS: LINES, TUBES AND DEVICES: Biliary stent has been placed. Pigtail catheter is looped just beneath the liver. BOWEL: There is moderate foreign fecal material within the colon. Nonobstructive bowel gas pattern. SOFT TISSUES: Moderate vascular calcifications. BONES: Mild  levoscoliosis of the lumbar spine and multilevel chronic degenerative disc disease. Status post sternotomy. No acute fracture. IMPRESSION: 1. Biliary stent and right upper quadrant pigtail catheter in place. 2. Moderate colonic stool burden without evidence of bowel obstruction. 3. No acute findings. Electronically signed by: Evalene Coho MD 11/30/2024 04:30 AM EST RP Workstation: [id number]    Anti-infectives: Anti-infectives (From admission, onward)     Start     Dose/Rate Route Frequency Ordered Stop   11/26/24 2200  cefTRIAXone  (ROCEPHIN ) 2 g in sodium chloride  0.9 % 100 mL IVPB        2 g 200 mL/hr over 30 Minutes Intravenous Every 24 hours 11/26/24 1458 11/29/24 1732   11/25/24 2200  metroNIDAZOLE  (FLAGYL ) tablet 500 mg        500 mg Oral Every 12 hours 11/25/24 0949 11/28/24 2257   11/24/24 2200  cefTRIAXone  (ROCEPHIN ) 2 g in sodium chloride  0.9 % 100 mL IVPB  Status:  Discontinued        2 g 200 mL/hr over 30 Minutes Intravenous Every 24 hours 11/24/24 1921 11/26/24 1458   11/24/24 2200  metroNIDAZOLE  (FLAGYL ) IVPB 500 mg  Status:  Discontinued        500 mg 100 mL/hr over 60 Minutes Intravenous Every 12 hours 11/24/24 1921 11/25/24 0949   11/23/24 1400  piperacillin -tazobactam (ZOSYN ) IVPB 2.25 g  Status:  Discontinued        2.25 g 100 mL/hr over 30 Minutes Intravenous Every 6 hours 11/23/24 0947 11/24/24 1921   11/22/24 2100  piperacillin -tazobactam (ZOSYN ) IVPB 3.375 g  Status:  Discontinued        3.375 g 12.5 mL/hr over 240 Minutes Intravenous Every 8 hours 11/22/24 2100 11/23/24 0947   11/19/24 2000  piperacillin -tazobactam (ZOSYN ) IVPB 3.375 g  Status:  Discontinued        3.375 g 12.5 mL/hr over 240 Minutes Intravenous Every 8 hours 11/19/24 1901 11/22/24 2100   11/19/24 1300  piperacillin -tazobactam (ZOSYN ) IVPB 3.375 g        3.375 g 100 mL/hr over 30 Minutes Intravenous  Once 11/19/24 1259 11/19/24 1508        Assessment/Plan 68 yo male admitted with acute cholecystitis and biliary stricture. He has a percutaneous cholecystostomy in place, as well as a biliary stent with subsequent improvement in LFTs. He underwent an EUS two days ago, which confirmed a malignant-appearing mass in the head of the pancreas with possible portal vein involvement. There does not appear to be any metastatic, however vascular involvement and resectability is very difficult to determine without IV contrast, which cannot be given at this  time due to his acute kidney injury. He currently has more pressing urgent issues such as his a-fib, PE, and AKI. Is being transferred to progressive care.   I briefly discussed with him that treatment of pancreatic cancer usually involves chemotherapy and surgery. We are awaiting FNA results but this is very likely pancreatic adenocarcinoma. Will need to get further imaging pending resolution of acute medical issues.     LOS: 11 days    Leonor Dawn, MD [facility] Surgery General, Hepatobiliary and Pancreatic Surgery 11/30/2024 12:44 PM  "

## 2024-11-30 NOTE — Progress Notes (Signed)
 During shift report patient was in A-FIB. Evening nurse reports that patient was in A-fib with RVR during the evening shift. MD notified pt HR is above 140 bpm. Pt is nauseous without vomiting. MD ordered 5 mg IVP of metoprolol . Change of level of care and a transfer order placed. 15 minutes after administration pt's bp remains about 120. MD notified. Another 5 mg dose of metoprolol  ordered. 15 minutes after administration pt HR remains above 120 . MD notified. MD ordered 20 mg IVP of diltiazem . Pt remains nauseous without vomiting. Currently waiting for bed top open up on a progressive unit.

## 2024-11-30 NOTE — Progress Notes (Signed)
Pt arrived from ....64M., A/ox .4.Marland Kitchenpt denies any pain, MD aware,CCMD called. CHG bath given,no further needs at this time

## 2024-11-30 NOTE — Progress Notes (Signed)
 PHARMACY - ANTICOAGULATION CONSULT NOTE  Pharmacy Consult for IV heparin  Indication: pulmonary embolus  Allergies[1]  Patient Measurements: Height: 5' 10 (177.8 cm) Weight: 67.1 kg (148 lb) IBW/kg (Calculated) : 73 HEPARIN  DW (KG): 67.1  Vital Signs: Temp: 98.3 F (36.8 C) (12/20 0759) Temp Source: Oral (12/20 0759) BP: 138/92 (12/20 0759) Pulse Rate: 72 (12/20 0759)  Labs: Recent Labs    11/27/24 2341 11/28/24 0506 11/28/24 2140 11/29/24 0803 11/29/24 0805 11/29/24 1820 11/30/24 0148 11/30/24 0938  HGB  --  9.3* 10.0*  --  9.8*  --   --  10.4*  HCT  --  26.7* 30.4*  --  29.4*  --   --  31.1*  PLT  --  294 368  --  365  --   --  434*  APTT 50*  --   --   --   --   --   --   --   HEPARINUNFRC 0.22*  --   --  0.26*  --  0.36 0.48  --   CREATININE  --  3.05*  --   --  2.77*  --   --  2.92*    Estimated Creatinine Clearance: 22.3 mL/min (A) (by C-G formula based on SCr of 2.92 mg/dL (H)).  Assessment: Colin Keller is a 68 y.o. year old male admitted on 11/19/2024 with concern for cholecystitis s/p ERCP w/ stenting on 12/12 and percutaneous cholecystostomy by IR on 12/14. Recently diagnosed with new PE (11/15/24) and started on eliquis . Last dose eliquis  prior to admission 12/8 @ 9:30 pm. Pharmacy consulted to restart heparin  at midnight (no bolus d/t required pRBC transfusion, last given 12/12).   12/20 AM: Heparin  drip was temporarily held this morning due to concerns for bleeding and pharmacy re-consulted to resume given stable CBC results (hemoglobin 10.4, platelets 434). Patient was previously therapeutic at infusion rate of 1100 units/hr, therefore, will resume at this rate for now and adjust as needed.   Goal of Therapy:  Heparin  level 0.3-0.7 units/ml aPTT 66-102 seconds Monitor platelets by anticoagulation protocol: Yes   Plan:  Continue heparin  infusion at 1100 units/hr Check confirmatory heparin  level in 8hrs Monitor daily heparin  level, CBC, signs/symptoms  of bleeding   Thank you for allowing pharmacy to be a part of this patients care.  Feliciano Close, PharmD PGY2 Infectious Diseases Pharmacy Resident     [1]  Allergies Allergen Reactions   Amlodipine  Other (See Comments)    Leg swelling   Empagliflozin  Other (See Comments)    Euglycemic DKA   Amitriptyline  Other (See Comments)    Urinary retention   Lisinopril  Other (See Comments)    Hyperkalemia    Nortriptyline  Hcl Other (See Comments)    Vivid / bad dreams   Statins     Aches in Joints    Simvastatin  Other (See Comments)    Arthralgias

## 2024-12-01 ENCOUNTER — Encounter (HOSPITAL_COMMUNITY): Payer: Self-pay | Admitting: Gastroenterology

## 2024-12-01 LAB — CBC
HCT: 29.2 % — ABNORMAL LOW (ref 39.0–52.0)
Hemoglobin: 9.7 g/dL — ABNORMAL LOW (ref 13.0–17.0)
MCH: 31 pg (ref 26.0–34.0)
MCHC: 33.2 g/dL (ref 30.0–36.0)
MCV: 93.3 fL (ref 80.0–100.0)
Platelets: 449 K/uL — ABNORMAL HIGH (ref 150–400)
RBC: 3.13 MIL/uL — ABNORMAL LOW (ref 4.22–5.81)
RDW: 15.6 % — ABNORMAL HIGH (ref 11.5–15.5)
WBC: 25.6 K/uL — ABNORMAL HIGH (ref 4.0–10.5)
nRBC: 0 % (ref 0.0–0.2)

## 2024-12-01 LAB — GLUCOSE, CAPILLARY
Glucose-Capillary: 346 mg/dL — ABNORMAL HIGH (ref 70–99)
Glucose-Capillary: 310 mg/dL — ABNORMAL HIGH (ref 70–99)
Glucose-Capillary: 179 mg/dL — ABNORMAL HIGH (ref 70–99)
Glucose-Capillary: 231 mg/dL — ABNORMAL HIGH (ref 70–99)

## 2024-12-01 LAB — COMPREHENSIVE METABOLIC PANEL WITH GFR
ALT: 52 U/L — ABNORMAL HIGH (ref 0–44)
AST: 19 U/L (ref 15–41)
Albumin: 2.8 g/dL — ABNORMAL LOW (ref 3.5–5.0)
Alkaline Phosphatase: 620 U/L — ABNORMAL HIGH (ref 38–126)
Anion gap: 14 (ref 5–15)
BUN: 61 mg/dL — ABNORMAL HIGH (ref 8–23)
CO2: 27 mmol/L (ref 22–32)
Calcium: 8.8 mg/dL — ABNORMAL LOW (ref 8.9–10.3)
Chloride: 98 mmol/L (ref 98–111)
Creatinine, Ser: 3.17 mg/dL — ABNORMAL HIGH (ref 0.61–1.24)
GFR, Estimated: 20 mL/min — ABNORMAL LOW
Glucose, Bld: 332 mg/dL — ABNORMAL HIGH (ref 70–99)
Potassium: 4.3 mmol/L (ref 3.5–5.1)
Sodium: 139 mmol/L (ref 135–145)
Total Bilirubin: 0.8 mg/dL (ref 0.0–1.2)
Total Protein: 5.9 g/dL — ABNORMAL LOW (ref 6.5–8.1)

## 2024-12-01 LAB — MAGNESIUM: Magnesium: 2.4 mg/dL (ref 1.7–2.4)

## 2024-12-01 LAB — HEPARIN LEVEL (UNFRACTIONATED): Heparin Unfractionated: 0.46 [IU]/mL (ref 0.30–0.70)

## 2024-12-01 LAB — PROTIME-INR
INR: 1.2 (ref 0.8–1.2)
Prothrombin Time: 16.1 s — ABNORMAL HIGH (ref 11.4–15.2)

## 2024-12-01 MED ORDER — SENNA 8.6 MG PO TABS
1.0000 | ORAL_TABLET | Freq: Two times a day (BID) | ORAL | Status: DC
  Administered 2024-12-01 – 2024-12-05 (×6): 8.6 mg via ORAL
  Filled 2024-12-01 (×8): qty 1

## 2024-12-01 MED ORDER — LACTULOSE 10 GM/15ML PO SOLN
20.0000 g | Freq: Two times a day (BID) | ORAL | Status: AC
  Administered 2024-12-01 (×2): 20 g via ORAL
  Filled 2024-12-01 (×2): qty 30

## 2024-12-01 MED ORDER — HYDRALAZINE HCL 25 MG PO TABS
25.0000 mg | ORAL_TABLET | Freq: Three times a day (TID) | ORAL | Status: DC
  Administered 2024-12-01: 25 mg via ORAL
  Filled 2024-12-01: qty 1

## 2024-12-01 MED ORDER — HYDRALAZINE HCL 50 MG PO TABS: 50.0000 mg | ORAL_TABLET | Freq: Three times a day (TID) | ORAL | Status: DC

## 2024-12-01 MED ORDER — HYDRALAZINE HCL 50 MG PO TABS
50.0000 mg | ORAL_TABLET | Freq: Three times a day (TID) | ORAL | Status: DC
  Administered 2024-12-01: 50 mg via ORAL
  Filled 2024-12-01: qty 1

## 2024-12-01 NOTE — Progress Notes (Signed)
"  °  Progress Note Patient Name: Colin Keller Date of Encounter: 12/01/2024 [facility] Cardiologist: Redell Shallow, MD   Interval Summary   He reports improvement in PO intake. He was unable to eat much yesterday.   Vital Signs Vitals:   12/01/24 0724 12/01/24 0754 12/01/24 0824 12/01/24 0833  BP: (!) 199/85 (!) 196/76 (!) 196/76   Pulse: 88 85    Resp: 17 16  (!) 25  Temp: 98 F (36.7 C) 98.4 F (36.9 C)    TempSrc: Oral Oral    SpO2: 96% 98%    Weight:      Height:        Intake/Output Summary (Last 24 hours) at 12/01/2024 1014 Last data filed at 12/01/2024 9346 Gross per 24 hour  Intake 577.15 ml  Output 310 ml  Net 267.15 ml      12/01/2024    3:51 AM 11/28/2024    2:57 PM 11/22/2024    6:43 AM  Last 3 Weights  Weight (lbs) 151 lb 10.8 oz 148 lb 148 lb 13 oz  Weight (kg) 68.8 kg 67.132 kg 67.5 kg      Telemetry/ECG  NSR - Personally Reviewed  Physical Exam  GEN: No acute distress.   Neck: No JVD Cardiac: RRR, no murmurs, rubs, or gallops.  Respiratory: Clear to auscultation bilaterally. GI: Soft, nontender, non-distended  MS: No edema  Assessment & Plan  Colin Keller is a 43 yoM with Hx of CAD s/p CABG (2019), PAD s/p left SFA angioplasty (2023), CKDIV, carotid artery disease who presented biliary sepsis s/p ERCP stenting (12/12) and cholecystostomy (12/14) and hospitalization c/b PE (12/5), new onset Afib with RVR (12/17) and anemia requiring 1 units of pRBCs while on Eliquis  (12/17). Repeat TTE 12/17 40-45% (from 50-55% 12/13) in setting of Afib with RVR. He was started on amio gtt on 12/20 given inability to tolerate PO intake and had a brief interrupt in heparin  gtt at that time. He converted to NSR on 12/21. Hydral was held on 12/20.    #Afib with RVR #CAD s/p CABG #PAD s/p left SFA angioplasty #CKDIV (bl 2.1-2.2) #PE on heparin  gtt - Denies any palpitations or symptoms of volume overload. In NSR at this time. - Would stop amio gtt and  continue with PO dilt 120 daily; if patient is hemodynamically stable in 24-48 hrs, can resume metop 50 BID - Can resume hydral given significant BP; Cont Imdur  for GDMT, limited by CKDIV - Limited echo prior to discharge if patient converts to NSR - We will cont to follow   For questions or updates, please contact [facility] Please consult [email or web address] for contact info under  Signed, Colin VEAR Ren Donley, MD  "

## 2024-12-01 NOTE — Assessment & Plan Note (Addendum)
 HR improved on amio drip, now off drip and restarted PO dilt per Cards. - Start Diltiazem  120mg  daily  - If VSS x24-48hrs, may restart metop per Cards recs - Stop amio drip - On hep drip, scheduled to stop at 5am for IR procedure

## 2024-12-01 NOTE — Assessment & Plan Note (Addendum)
 Remains afebrile. 110cc output from cholecystostomy tube. Abm is distended today, suspect stool burden from poor PO and opioid meds. - Pending pancreas bx results - GI and Gen Surg consulted, appreciate recs - Cholecystostomy tube in place. Appreciate IR recs. - Pain regimen: Continue oxycodone  5mg  q8 prn - Bowel regimen: 2 doses of lactulose  today. increase senna to BID. Cont miralax  BID. - AM BMP, Mg

## 2024-12-01 NOTE — Progress Notes (Signed)
 " [facility] Progress Note    Assessment/ Plan:   AKI on CKD3B Nephrotic Syndrome -baseline Cr ~1.9-2.2, followed by Dr. Rayburn OP. Has baseline nephrotic range proteinuria. Renal ultrasound without obstruction. Urine sediment relatively bland. Peak Cr ~4.1 -AKI presumed to be pre-renal injury and possibly zosyn  related AKI. Zosyn  stopped -Cr up--3.17, likely went up in the context of diuretics and significant hyperglycemia. Hold diuretics today, can hold fluids-encouraged PO hydration -his nephrotic syndrome is likely being driven by uncontrolled DM but I believe we should move forward with a renal biopsy while he's here. Last ASA dose 12/15--plan for biopsy this coming Monday with IR (req form in chart). Serologies unrevealing with the exception of ANA positive (anti centromere ab) -no indications to start renal replacement therapy -continue with bladder scans -Avoid nephrotoxic medications including NSAIDs and iodinated intravenous contrast exposure unless the latter is absolutely indicated.  Preferred narcotic agents for pain control are hydromorphone , fentanyl , and methadone. Morphine  should not be used. Avoid Baclofen  and avoid oral sodium phosphate and magnesium  citrate based laxatives / bowel preps. Continue strict Input and Output monitoring. Will monitor the patient closely with you and intervene or adjust therapy as indicated by changes in clinical status/labs   SOB -CXR images personally reviewed: with vascular congestion -d/c'ed fluids -Resolved post lasix   Biliary obstruction Elevated LFTs-improving -s/p ERCP with stent, s/p chole tube w/ IR -GI and gen surg following -zosyn  switched to rocephin  and flagyl  -s/p EGD 1216, EUS reattempt 12/18: pancreatic mass found--likely pancreatic cancer with portal vein involvement--following. FNA results pending.  Afib with RVR -cardiology following -on amio and hep gtt  HTN -hold home lasix , can titrate  hydralazine  if needed  Uncontrolled DM2 with hyperglycemia -mgmt per primary service  PE -per primary service, A/C per primary -suspecting this is secondary to nephrotic syndrome?  Anemia -hgb 9.7 -transfuse prn for hgb <7 -would not entertain ESA at this time given possible pancreatic adenocarcinoma   Subjective:   Patient seen and examined bedside. He reports feeling weak/tired. SOB resolved   Objective:   BP (!) 172/77   Pulse 79   Temp 97.9 F (36.6 C) (Oral)   Resp 14   Ht 5' 10 (1.778 m)   Wt 68.8 kg   SpO2 96%   BMI 21.76 kg/m   Intake/Output Summary (Last 24 hours) at 12/01/2024 1134 Last data filed at 12/01/2024 9346 Gross per 24 hour  Intake 577.15 ml  Output 310 ml  Net 267.15 ml   Weight change:   Physical Exam: Gen: NAD, ill/tired appearing CVS: RRR Resp: normal wob, unlabored Abd: soft, nt/nd, +chole drain Ext: no edema Neuro: awake/alert, following commands  Imaging: DG CHEST PORT 1 VIEW Result Date: 11/30/2024 CLINICAL DATA:  Shortness of breath. EXAM: PORTABLE CHEST 1 VIEW COMPARISON:  Chest [place]  dated 11/15/2024. FINDINGS: No focal consolidation, pleural effusion or pneumothorax. Mild central vascular congestion. The cardiac silhouette. Median sternotomy wires. No acute osseous pathology. IMPRESSION: Mild central vascular congestion. No focal consolidation. Electronically Signed   By: Vanetta Chou M.D.   On: 11/30/2024 12:27   DG Abd 1 View Result Date: 11/30/2024 EXAM: 1 VIEW XRAY OF THE ABDOMEN 11/30/2024 04:08:00 AM COMPARISON: CT of the abdomen and pelvis dated 11/19/2024. CLINICAL HISTORY: [id number] Vomiting [id number] Vomiting FINDINGS: LINES, TUBES AND DEVICES: Biliary stent has been placed. Pigtail catheter is looped just beneath the liver. BOWEL: There is moderate foreign fecal material within the colon. Nonobstructive bowel gas pattern. SOFT TISSUES: Moderate vascular calcifications. BONES:  Mild levoscoliosis of the lumbar spine  and multilevel chronic degenerative disc disease. Status post sternotomy. No acute fracture. IMPRESSION: 1. Biliary stent and right upper quadrant pigtail catheter in place. 2. Moderate colonic stool burden without evidence of bowel obstruction. 3. No acute findings. Electronically signed by: Evalene Coho MD 11/30/2024 04:30 AM EST RP Workstation: [id number]    Labs: BMET Recent Labs  Lab 11/25/24 0414 11/26/24 0440 11/27/24 0406 11/28/24 0506 11/29/24 0805 11/30/24 0938 12/01/24 0343  NA 132* 137 139 135 136 142 139  K 4.1 3.8 4.3 4.3 4.5 5.0 4.3  CL 101 106 106 103 103 101 98  CO2 17* 22 23 23 22  32 27  GLUCOSE 296* 130* 120* 202* 141* 171* 332*  BUN 77* 75* 66* 58* 53* 52* 61*  CREATININE 3.98* 3.91* 3.38* 3.05* 2.77* 2.92* 3.17*  CALCIUM  8.0* 7.9* 8.5* 8.4* 8.8* 9.2 8.8*  PHOS  --   --   --  4.2  --   --   --    CBC Recent Labs  Lab 11/25/24 0414 11/26/24 0440 11/28/24 2140 11/29/24 0805 11/30/24 0938 12/01/24 0343  WBC 14.0*   < > 14.5* 13.5* 15.6* 25.6*  NEUTROABS 11.7*  --   --  9.8*  --   --   HGB 7.9*   < > 10.0* 9.8* 10.4* 9.7*  HCT 23.2*   < > 30.4* 29.4* 31.1* 29.2*  MCV 91.0   < > 93.3 94.5 93.1 93.3  PLT 247   < > 368 365 434* 449*   < > = values in this interval not displayed.    Medications:     acetaminophen   650 mg Oral QID   Chlorhexidine  Gluconate Cloth  6 each Topical Daily   diltiazem   120 mg Oral Daily   feeding supplement  1 Container Oral TID BM   hydrALAZINE   25 mg Oral Q8H   insulin  aspart  0-9 Units Subcutaneous TID WC   isosorbide  mononitrate  60 mg Oral Daily   lactulose   20 g Oral BID   lidocaine   3 patch Transdermal Q24H   multivitamin with minerals  1 tablet Oral Daily   pantoprazole  (PROTONIX ) IV  40 mg Intravenous Q12H   polyethylene glycol  17 g Oral BID   senna  1 tablet Oral BID   sodium chloride  flush  5 mL Intracatheter Q8H   sucralfate   1 g Oral BID   tamsulosin   0.4 mg Oral Daily      Ephriam Stank,  MD [facility] 12/01/2024, 11:34 AM   "

## 2024-12-01 NOTE — Assessment & Plan Note (Addendum)
-   Continue tetracaine  0.5% ophthalmic anesthetic eye drops - Continue protective eye patch

## 2024-12-01 NOTE — Progress Notes (Signed)
 Mobility Specialist Progress Note:    12/01/24 1120  Mobility  Activity Ambulated with assistance  Level of Assistance Contact guard assist, steadying assist  Assistive Device Front wheel walker  Distance Ambulated (ft) 15 ft  Range of Motion/Exercises Active  Activity Response Tolerated fair  Mobility Referral Yes  Mobility visit 1 Mobility  Mobility Specialist Start Time (ACUTE ONLY) 1120  Mobility Specialist Stop Time (ACUTE ONLY) 1129  Mobility Specialist Time Calculation (min) (ACUTE ONLY) 9 min   Received pt laying in bed agreeable to session. No c/o any symptoms. Pt moving slow but decently. Returned pt to bed w/ all needs met.   Venetia Keel Mobility Specialist Please Neurosurgeon or Rehab Office at [phone]

## 2024-12-01 NOTE — Progress Notes (Signed)
 "    Daily Progress Note Intern Pager: [phone]  Patient name: Colin Keller Medical record number: [id number] Date of birth: 1956/01/06 Age: 68 y.o. Gender: male  Primary Care Provider: McDiarmid, Krystal BIRCH, MD Consultants: Nephrology, cardiology, IR, GEN surge, GI Code Status: Full  Pt Overview and Major Events to Date:  12/9 - admitted 12/12 - ERCP, transfer to ICU for persistent bradycardia 12/14 - return to floor from ICU, IR and Nephro consulted, IR placed cholecystostomy tube  12/17 - new Afib RvR 12/18 - EUS, pancreatic biopsies collected    Assessment and Plan:  68 year old man with history of recent small PE admitted for biliary stricture s/p ERCP with CBD dilatation s/p percutaneous cholecystostomy tube and also found to have pancreatic lesions s/p endoscopic ultrasound, duodenal clip, and biopsy of pancreatic lesions; also hospital course complicated by new afib RVR.  Pending results of pancreatic biopsy and at scheduled kidney biopsy 12/22. Assessment & Plan Coronary artery disease involving native coronary artery of native heart without angina pectoris Atrial fibrillation with RVR (HCC) HR improved on amio drip, now off drip and restarted PO dilt per Cards. - Start Diltiazem  120mg  daily  - If VSS x24-48hrs, may restart metop per Cards recs - Stop amio drip - On hep drip, scheduled to stop at 5am for IR procedure  AKI (acute kidney injury) Creatinine stably elevated. Suspect diabetic nephropathy, but is undergoing kidney Bx w/ IR and nephrology for further workup. - IR planning for kidney biopsy tomorrow 12/22.   - NPO MN  - Heparin  drip timed to stop before procedure  - Will need to control BP <150/90 per IR recs (see below for HTN management) - holding home lasix , hydrochlorothiazide , coreg  - AM CMP HTN (hypertension) BP elevated. Will optimize BP control in anticipation of IR renal bx tomorrow. - Restart hydralazine  25mg  TID, consider increasing to 50mg  TID if PM  BP's still high  - Can give additionaly spot doses of hydral 10 if needed Abdominal pain Biliary stricture (HCC) Biliary sepsis (HCC) Pancreatic lesion Remains afebrile. 110cc output from cholecystostomy tube. Abm is distended today, suspect stool burden from poor PO and opioid meds. - Pending pancreas bx results - GI and Gen Surg consulted, appreciate recs - Cholecystostomy tube in place. Appreciate IR recs. - Pain regimen: Continue oxycodone  5mg  q8 prn - Bowel regimen: 2 doses of lactulose  today. increase senna to BID. Cont miralax  BID. - AM BMP, Mg Eye pain, left - Continue tetracaine  0.5% ophthalmic anesthetic eye drops - Continue protective eye patch Anemia Acute pulmonary embolism (HCC) Hgb stable.  - Hep drip scheduled to stop prior to IR procedure  - Holding home eliquis  - AM CBC T2DM (type 2 diabetes mellitus) (HCC) - Cont SSI - Hold home metformin  Chronic health problem CAD/CHF: Holding home coreg , hydral.  BPH: Continue home tamsulosin  GERD: Continue home Protonix     FEN/GI: FLD, NPO MN for IR procedure 12/22 PPx: heparin  drip Dispo: Pending clinical improvement  Subjective:  Reports feeling ok, except for having no appetite. Feel bloated, reports no BM for past 2-3 days.  Is drinking, but did not feel like eating breakfast  Objective: Temp:  [97.6 F (36.4 C)-98.6 F (37 C)] 98.4 F (36.9 C) (12/21 0754) Pulse Rate:  [85-144] 85 (12/21 0754) Resp:  [14-25] 25 (12/21 0833) BP: (104-199)/(76-100) 196/76 (12/21 0824) SpO2:  [96 %-100 %] 98 % (12/21 0754) Weight:  [68.8 kg] 68.8 kg (12/21 0351) Physical Exam: General: Alert, chronically ill-appearing man laying in bed.  NAD Cardiovascular: RRR Respiratory: CTAB.  Normal work of breathing on RA Abdomen: Distended, nontender.  Normal BS.    Laboratory: Most recent CBC Lab Results  Component Value Date   WBC 25.6 (H) 12/01/2024   HGB 9.7 (L) 12/01/2024   HCT 29.2 (L) 12/01/2024   MCV 93.3 12/01/2024    PLT 449 (H) 12/01/2024   Most recent BMP    Latest Ref Rng & Units 12/01/2024    3:43 AM  BMP  Glucose 70 - 99 mg/dL 667   BUN 8 - 23 mg/dL 61   Creatinine 9.38 - 1.24 mg/dL 6.82   Sodium 864 - 854 mmol/L 139   Potassium 3.5 - 5.1 mmol/L 4.3   Chloride 98 - 111 mmol/L 98   CO2 22 - 32 mmol/L 27   Calcium  8.9 - 10.3 mg/dL 8.8      Elicia Hamlet, MD 12/01/2024, 9:12 AM  PGY-3, [facility] FPTS Intern pager: [phone], text pages welcome Secure chat group [facility] Teaching Service   "

## 2024-12-01 NOTE — Assessment & Plan Note (Addendum)
 CAD/CHF: Holding home coreg , hydral.  BPH: Continue home tamsulosin  GERD: Continue home Protonix 

## 2024-12-01 NOTE — Progress Notes (Signed)
 PHARMACY - ANTICOAGULATION CONSULT NOTE  Pharmacy Consult for IV heparin  Indication: pulmonary embolus  Allergies[1]  Patient Measurements: Height: 5' 10 (177.8 cm) Weight: 68.8 kg (151 lb 10.8 oz) IBW/kg (Calculated) : 73 HEPARIN  DW (KG): 67.1  Vital Signs: Temp: 98 F (36.7 C) (12/21 0724) Temp Source: Oral (12/21 0724) BP: 199/85 (12/21 0724) Pulse Rate: 88 (12/21 0724)  Labs: Recent Labs    11/29/24 0805 11/29/24 1820 11/30/24 0148 11/30/24 0938 11/30/24 2040 12/01/24 0343  HGB 9.8*  --   --  10.4*  --  9.7*  HCT 29.4*  --   --  31.1*  --  29.2*  PLT 365  --   --  434*  --  449*  LABPROT  --   --   --   --   --  16.1*  INR  --   --   --   --   --  1.2  HEPARINUNFRC  --    < > 0.48  --  0.44 0.46  CREATININE 2.77*  --   --  2.92*  --  3.17*   < > = values in this interval not displayed.    Estimated Creatinine Clearance: 21.1 mL/min (A) (by C-G formula based on SCr of 3.17 mg/dL (H)).  Assessment: Colin Keller is a 68 y.o. year old male admitted on 11/19/2024 with concern for cholecystitis s/p ERCP w/ stenting on 12/12 and percutaneous cholecystostomy by IR on 12/14. Recently diagnosed with new PE (11/15/24) and started on eliquis . Last dose eliquis  prior to admission 12/8 @ 9:30 pm. Pharmacy consulted to restart heparin  at midnight (no bolus d/t required pRBC transfusion, last given 12/12).   12/21 AM: Heparin  level at goal 0.46 on 1100 units/hr. Hgb (9.7), PLT (449) remain stable. No bleeding reported.  Goal of Therapy:  Heparin  level 0.3-0.7 units/ml aPTT 66-102 seconds Monitor platelets by anticoagulation protocol: Yes   Plan:  Continue heparin  infusion at 1100 units/hr Heparin  infusion to be held tomorrow, 12/22 at 0400 prior to renal biopsy Monitor daily heparin  level, CBC, signs/symptoms of bleeding  F/u resume apixaban  after renal biopsy  Izetta Carl, PharmD PGY1 Pharmacy Resident        [1]  Allergies Allergen Reactions   Amlodipine   Other (See Comments)    Leg swelling   Empagliflozin  Other (See Comments)    Euglycemic DKA   Amitriptyline  Other (See Comments)    Urinary retention   Lisinopril  Other (See Comments)    Hyperkalemia    Nortriptyline  Hcl Other (See Comments)    Vivid / bad dreams   Statins     Aches in Joints    Simvastatin  Other (See Comments)    Arthralgias

## 2024-12-01 NOTE — Plan of Care (Signed)
 IR was requested for random renal biopsy last week, he is scheduled for Monday 12/22 after ASA washout.   PLAN - Formal consult done on 12/15  - Npo except meds at midnight.  - Asked pharmacy to start holding heparin  gtt at Monday 4 am  - Asked primary team to bring BP below 150/90 by tomorrow AM - INR 1.2 today, CBC with WBC 25.6 (15.6 yesterday) hgb 9.7, plt 449   The procedure is tentatively scheduled for tomorrow pending IR schedule.  Please call IR for questions and concerns.    Treven Holtman H Cheron Coryell PA-C 12/01/2024 10:40 AM

## 2024-12-01 NOTE — Assessment & Plan Note (Addendum)
-   Cont SSI - Hold home metformin

## 2024-12-01 NOTE — Assessment & Plan Note (Addendum)
 Hgb stable.  - Hep drip scheduled to stop prior to IR procedure  - Holding home eliquis  - AM CBC

## 2024-12-01 NOTE — Assessment & Plan Note (Signed)
 Remains afebrile. 110cc output from cholecystostomy tube. Abm is distended today, suspect stool burden from poor PO and opioid meds. - Pending pancreas bx results - GI and Gen Surg consulted, appreciate recs - Cholecystostomy tube in place. Appreciate IR recs. - Pain regimen: Continue oxycodone  5mg  q8 prn - Bowel regimen: 2 doses of lactulose  today. increase senna to BID. Cont miralax  BID. - AM BMP, Mg

## 2024-12-01 NOTE — Assessment & Plan Note (Addendum)
 Creatinine stably elevated. Suspect diabetic nephropathy, but is undergoing kidney Bx w/ IR and nephrology for further workup. - IR planning for kidney biopsy tomorrow 12/22.   - NPO MN  - Heparin  drip timed to stop before procedure  - Will need to control BP <150/90 per IR recs (see below for HTN management) - holding home lasix , hydrochlorothiazide , coreg  - AM CMP

## 2024-12-01 NOTE — Progress Notes (Signed)
 Brief PCCM progress note  Patient assessed this a.m. and he remains stable on progressive unit.  Heart rate more controlled with IV amiodarone  drip.  He continues to express some chronic back pain but states he feels well overall.  No further critical care needs identified at, PCCM will sign off.  Please call if any additional assistance is needed.  Jaquelynn Wanamaker D. Harris, NP-C  Pulmonary & Critical Care Personal contact information can be found on Amion  If no contact or response made please call [phone] 12/01/2024, 8:45 AM

## 2024-12-01 NOTE — Plan of Care (Signed)

## 2024-12-01 NOTE — Assessment & Plan Note (Signed)
 BP elevated. Will optimize BP control in anticipation of IR renal bx tomorrow. - Restart hydralazine  25mg  TID, consider increasing to 50mg  TID if PM BP's still high  - Can give additionaly spot doses of hydral 10 if needed

## 2024-12-02 ENCOUNTER — Inpatient Hospital Stay (HOSPITAL_COMMUNITY)

## 2024-12-02 ENCOUNTER — Ambulatory Visit: Payer: Self-pay | Admitting: Gastroenterology

## 2024-12-02 ENCOUNTER — Other Ambulatory Visit: Payer: Self-pay

## 2024-12-02 DIAGNOSIS — I5022 Chronic systolic (congestive) heart failure: Secondary | ICD-10-CM

## 2024-12-02 DIAGNOSIS — R7989 Other specified abnormal findings of blood chemistry: Secondary | ICD-10-CM

## 2024-12-02 DIAGNOSIS — R1031 Right lower quadrant pain: Secondary | ICD-10-CM

## 2024-12-02 LAB — GLUCOSE, CAPILLARY
Glucose-Capillary: 390 mg/dL — ABNORMAL HIGH (ref 70–99)
Glucose-Capillary: 372 mg/dL — ABNORMAL HIGH (ref 70–99)
Glucose-Capillary: 272 mg/dL — ABNORMAL HIGH (ref 70–99)
Glucose-Capillary: 119 mg/dL — ABNORMAL HIGH (ref 70–99)

## 2024-12-02 LAB — BASIC METABOLIC PANEL WITH GFR
Anion gap: 19 — ABNORMAL HIGH (ref 5–15)
BUN: 73 mg/dL — ABNORMAL HIGH (ref 8–23)
CO2: 21 mmol/L — ABNORMAL LOW (ref 22–32)
Calcium: 8.6 mg/dL — ABNORMAL LOW (ref 8.9–10.3)
Chloride: 98 mmol/L (ref 98–111)
Creatinine, Ser: 3.48 mg/dL — ABNORMAL HIGH (ref 0.61–1.24)
GFR, Estimated: 18 mL/min — ABNORMAL LOW
Glucose, Bld: 384 mg/dL — ABNORMAL HIGH (ref 70–99)
Potassium: 4.5 mmol/L (ref 3.5–5.1)
Sodium: 138 mmol/L (ref 135–145)

## 2024-12-02 LAB — CBC WITH DIFFERENTIAL/PLATELET
Abs Immature Granulocytes: 0.12 K/uL — ABNORMAL HIGH (ref 0.00–0.07)
Basophils Absolute: 0.1 K/uL (ref 0.0–0.1)
Basophils Relative: 0 %
Eosinophils Absolute: 0 K/uL (ref 0.0–0.5)
Eosinophils Relative: 0 %
HCT: 26 % — ABNORMAL LOW (ref 39.0–52.0)
Hemoglobin: 8.5 g/dL — ABNORMAL LOW (ref 13.0–17.0)
Immature Granulocytes: 1 %
Lymphocytes Relative: 4 %
Lymphs Abs: 0.9 K/uL (ref 0.7–4.0)
MCH: 31.3 pg (ref 26.0–34.0)
MCHC: 32.7 g/dL (ref 30.0–36.0)
MCV: 95.6 fL (ref 80.0–100.0)
Monocytes Absolute: 0.7 K/uL (ref 0.1–1.0)
Monocytes Relative: 3 %
Neutro Abs: 19.3 K/uL — ABNORMAL HIGH (ref 1.7–7.7)
Neutrophils Relative %: 92 %
Platelets: 366 K/uL (ref 150–400)
RBC: 2.72 MIL/uL — ABNORMAL LOW (ref 4.22–5.81)
RDW: 15.7 % — ABNORMAL HIGH (ref 11.5–15.5)
WBC: 21 K/uL — ABNORMAL HIGH (ref 4.0–10.5)
nRBC: 0 % (ref 0.0–0.2)

## 2024-12-02 LAB — TROPONIN T, HIGH SENSITIVITY
Troponin T High Sensitivity: 97 ng/L — ABNORMAL HIGH (ref 0–19)
Troponin T High Sensitivity: 99 ng/L — ABNORMAL HIGH (ref 0–19)
Troponin T High Sensitivity: 96 ng/L — ABNORMAL HIGH (ref 0–19)

## 2024-12-02 LAB — HEPARIN LEVEL (UNFRACTIONATED)
Heparin Unfractionated: 0.47 [IU]/mL (ref 0.30–0.70)
Heparin Unfractionated: 0.22 [IU]/mL — ABNORMAL LOW (ref 0.30–0.70)

## 2024-12-02 LAB — MAGNESIUM: Magnesium: 2.6 mg/dL — ABNORMAL HIGH (ref 1.7–2.4)

## 2024-12-02 LAB — CYTOLOGY - NON PAP

## 2024-12-02 MED ORDER — HYDRALAZINE HCL 10 MG PO TABS: 10.0000 mg | ORAL_TABLET | Freq: Once | ORAL | Status: DC

## 2024-12-02 MED ORDER — GLYCERIN (LAXATIVE) 2 G RE SUPP
1.0000 | Freq: Once | RECTAL | Status: AC
  Administered 2024-12-02: 1 via RECTAL
  Filled 2024-12-02: qty 1

## 2024-12-02 MED ORDER — HYDRALAZINE HCL 50 MG PO TABS
100.0000 mg | ORAL_TABLET | Freq: Once | ORAL | Status: AC
  Administered 2024-12-02: 100 mg via ORAL
  Filled 2024-12-02: qty 2

## 2024-12-02 MED ORDER — OXYCODONE HCL 5 MG PO TABS
5.0000 mg | ORAL_TABLET | Freq: Once | ORAL | Status: AC
  Administered 2024-12-02: 5 mg via ORAL
  Filled 2024-12-02: qty 1

## 2024-12-02 MED ORDER — LACTULOSE 10 GM/15ML PO SOLN
20.0000 g | ORAL | Status: AC
  Administered 2024-12-02 (×2): 20 g via ORAL
  Filled 2024-12-02 (×3): qty 30

## 2024-12-02 MED ORDER — HYDRALAZINE HCL 20 MG/ML IJ SOLN
10.0000 mg | Freq: Once | INTRAMUSCULAR | Status: AC
  Administered 2024-12-02: 10 mg via INTRAVENOUS
  Filled 2024-12-02: qty 1

## 2024-12-02 MED ORDER — METOPROLOL TARTRATE 5 MG/5ML IV SOLN
5.0000 mg | INTRAVENOUS | Status: AC
  Administered 2024-12-02: 5 mg via INTRAVENOUS
  Filled 2024-12-02: qty 5

## 2024-12-02 MED ORDER — CARVEDILOL 6.25 MG PO TABS
6.2500 mg | ORAL_TABLET | Freq: Two times a day (BID) | ORAL | Status: DC
  Administered 2024-12-02 – 2024-12-04 (×5): 6.25 mg via ORAL
  Filled 2024-12-02 (×5): qty 1

## 2024-12-02 MED ORDER — INSULIN GLARGINE 100 UNIT/ML ~~LOC~~ SOLN
10.0000 [IU] | Freq: Every day | SUBCUTANEOUS | Status: DC
  Administered 2024-12-02: 10 [IU] via SUBCUTANEOUS
  Filled 2024-12-02: qty 0.1

## 2024-12-02 MED ORDER — INSULIN GLARGINE 100 UNIT/ML ~~LOC~~ SOLN
5.0000 [IU] | Freq: Every day | SUBCUTANEOUS | Status: DC
  Filled 2024-12-02: qty 0.05

## 2024-12-02 MED ORDER — FUROSEMIDE 10 MG/ML IJ SOLN
40.0000 mg | Freq: Once | INTRAMUSCULAR | Status: AC
  Administered 2024-12-02: 40 mg via INTRAVENOUS
  Filled 2024-12-02: qty 4

## 2024-12-02 MED ORDER — AMIODARONE HCL IN DEXTROSE 360-4.14 MG/200ML-% IV SOLN
30.0000 mg/h | INTRAVENOUS | Status: DC
  Administered 2024-12-02 – 2024-12-04 (×6): 30 mg/h via INTRAVENOUS
  Filled 2024-12-02 (×5): qty 200

## 2024-12-02 MED ORDER — HEPARIN (PORCINE) 25000 UT/250ML-% IV SOLN
1100.0000 [IU]/h | INTRAVENOUS | Status: DC
  Administered 2024-12-02: 1100 [IU]/h via INTRAVENOUS
  Filled 2024-12-02: qty 250

## 2024-12-02 MED ORDER — HYDRALAZINE HCL 50 MG PO TABS
50.0000 mg | ORAL_TABLET | Freq: Three times a day (TID) | ORAL | Status: DC
  Administered 2024-12-02 – 2024-12-06 (×12): 50 mg via ORAL
  Filled 2024-12-02 (×12): qty 1

## 2024-12-02 MED ADMIN — Amiodarone HCl in Dextrose 4.14% IV Soln 360 MG/200ML: 60 mg/h | INTRAVENOUS | NDC 43066036020

## 2024-12-02 MED ADMIN — Amiodarone HCl Inj 150 MG/3ML (50 MG/ML): 150 mg | INTRAVENOUS | NDC 43066036020

## 2024-12-02 MED FILL — Amiodarone HCl in Dextrose 4.14% IV Soln 360 MG/200ML: 60.0000 mg/h | INTRAVENOUS | Qty: 200 | Status: AC

## 2024-12-02 MED FILL — Amiodarone HCl Inj 150 MG/3ML (50 MG/ML): 150.0000 mg | INTRAVENOUS | Qty: 83.34 | Status: AC

## 2024-12-02 NOTE — Progress Notes (Signed)
 Patient went to A fib with RVR when transporter was here for IR,12 Lead EKG done,primary team at bedside  ,cardiac PA notified,vitals taken,pt remains asymptomatic

## 2024-12-02 NOTE — Assessment & Plan Note (Addendum)
 Back into A Fib RVR this morning with new ST depressions of anterior leads.  Largely asymptomatic with minimal chest pain no expressing GI discomfort.  Previously converted with IV amiodarone  - Continue diltiazem  120mg  daily  - Restart Hydralazine  50 mg TID, heart coreg  6.25 mg BID  - Back on amiodarone  drip 60 mg/h for 6 hours then 30 mg/h - Continue hep drip - Troponin 97 > 99 2 hours later - Continuous cardiac monitoring - Imdur  60 mg daily

## 2024-12-02 NOTE — Plan of Care (Signed)
 FMTS Interim Progress Note  S:Notified that pt was back in Afib RVR and EKG showed potential STEMI. Went to bedside with Dr. Lonnie. Pt was sitting in bed, asymptomatic. Denies chest pain. Reports some SOB, but he attributes this to laying in bed. In room, tele showed Afib RVR to 130s. Cardiology PA also came to bedside and plans to evaluate. We ordered metoprolol  IV 5mg  once, HR improved to 110-120s. Pt remained stable, speaking comfortably in full sentences.   O: BP (!) 160/71   Pulse 90   Temp 98.7 F (37.1 C) (Oral)   Resp 16   Ht 5' 10 (1.778 m)   Wt 68.5 kg   SpO2 95%   BMI 21.67 kg/m     A/P:  Afib RVR - hemodynamically stable. EKG showing ST depressions in V3,4,5.  - s/p metop 5mg  IV - F/u Cardiology recs, may restart amio drip - restart heparin  drip, tentatively postponing renal bx for now   Elicia Hamlet, MD 12/02/2024, 8:57 AM PGY-3, [facility] Family Medicine Service pager [phone]

## 2024-12-02 NOTE — Progress Notes (Addendum)
 "    Daily Progress Note Intern Pager: [phone]  Patient name: Colin Keller Medical record number: [id number] Date of birth: [birth year] Age: 68 y.o. Gender: male  Primary Care Provider: McDiarmid, Krystal BIRCH, MD Consultants: Nephrology, cardiology, IR, GEN surge, GI Code Status: Full  Pt Overview and Major Events to Date:  12/9 - admitted 12/12 - ERCP, transfer to ICU for persistent bradycardia 12/14 - return to floor from ICU, IR and Nephro consulted, IR placed cholecystostomy tube  12/17 - new Afib RvR 12/18 - EUS, pancreatic biopsies collected  Medical Decision Making:  68 year old man with history of recent small PE admitted for biliary stricture s/p ERCP with CBD dilatation s/p percutaneous cholecystostomy tube and also found to have pancreatic lesions s/p endoscopic ultrasound, duodenal clip, and biopsy of pancreatic lesions; also hospital course complicated by new afib RVR.  Return to A-fib RVR this morning with new ST depressions, cardiology following and trending troponins. Assessment & Plan Coronary artery disease involving native coronary artery of native heart without angina pectoris Atrial fibrillation with RVR (HCC) Back into A Fib RVR this morning with new ST depressions of anterior leads.  Largely asymptomatic with minimal chest pain no expressing GI discomfort.  Previously converted with IV amiodarone  - Continue diltiazem  120mg  daily  - Restart Hydralazine  50 mg TID, heart coreg  6.25 mg BID  - Back on amiodarone  drip 60 mg/h for 6 hours then 30 mg/h - Continue hep drip - Troponin 97 > 99 2 hours later - Continuous cardiac monitoring - Imdur  60 mg daily AKI (acute kidney injury) Creatinine stably elevated. Suspect diabetic nephropathy, but is undergoing kidney Bx w/ IR and nephrology for further workup. Look more volume overloaded today. - Nephrology following, appreciate their recommendations, will touch base regarding indications for biopsy - IR planning for kidney  biopsy today will be rescheduled with rate control.   - Heparin  drip resumed  - Will need to control BP <150/90 per IR recs (see below for HTN management) - holding hydrochlorothiazide  - AM BMP, CBC, mag HTN (hypertension) BP elevated.  Continued to remain elevated overnight and this morning. - Hydralazine  increased to 50 mg 3 times daily, increase to 100 mg p.o. dose at 5 AM, received 10 mg IV hydralazine  at 730 due to persistently elevated BPs, will plan to resume hydralazine  50 mg 3 times daily Abdominal pain Biliary stricture (HCC) Biliary sepsis (HCC) Pancreatic lesion Remains afebrile.  Leukocytosis mildly improving, still elevated 21, neutrophil predominant.  No charted output from cholecystostomy tube. No bowel movement yet.  - Pending pancreas bx results - GI and Gen Surg consulted, appreciate recs - Cholecystostomy tube in place. Appreciate IR recs. - Pain regimen: Continue oxycodone  5mg  q8 prn - Bowel regimen: Glycerin  suppository today, 3 doses of lactulose  today. Continue senna and miralax  BID. - labs above Eye pain, left - Continue tetracaine  0.5% ophthalmic anesthetic eye drops - Continue protective eye patch Anemia Acute pulmonary embolism (HCC) Hgb trended down slightly, 9.7 > 8.5.  - Hep drip scheduled to stop prior to IR procedure  - Holding home eliquis  - Labs as above T2DM (type 2 diabetes mellitus) (HCC) Received total of 25 units sliding scale, CBGs persistently greater than 300 - Plan to restart 10 units glargine - Cont SSI - Hold home metformin  Chronic health problem CAD/CHF: Holding home coreg , hydral.  BPH: Continue home tamsulosin  GERD: Continue home Protonix   FEN/GI: Currently n.p.o. for procedure PPx: Heparin , held for procedure Dispo: pending clinical improvement .  Subjective:  Saw patient at bedside, reports that he feels some abdominal discomfort unchanged from usual abdominal discomfort from pancreas.  Due to his chest pain, some mild  shortness of breath, no diaphoresis.  Reports he does have less nausea and vomiting.  No lightheadedness or dizziness.  Still reports no bowel movement  Objective: Temp:  [97.6 F (36.4 C)-99 F (37.2 C)] 98.7 F (37.1 C) (12/22 0719) Pulse Rate:  [77-92] 90 (12/22 0732) Resp:  [13-25] 16 (12/22 0732) BP: (157-196)/(68-77) 160/71 (12/22 0729) SpO2:  [95 %-99 %] 95 % (12/22 0719) Weight:  [68.5 kg] 68.5 kg (12/22 0231) Physical Exam: General: Resting comfortably in bed, no acute distress Cardiovascular: Regular rate and rhythm, no murmurs rubs or gallops, 2+ bilateral radial pulses Respiratory: Clear to auscultation bilaterally, no work of breathing, no wheezes or crackles Abdomen: Active bowel sounds, nondistended, mild tenderness to deep palpation generally Extremities: Pitting bilateral lower extremity edema 2+ to proximal 2/3 tibia  Laboratory: Most recent CBC Lab Results  Component Value Date   WBC 21.0 (H) 12/02/2024   HGB 8.5 (L) 12/02/2024   HCT 26.0 (L) 12/02/2024   MCV 95.6 12/02/2024   PLT 366 12/02/2024   Most recent BMP    Latest Ref Rng & Units 12/02/2024    3:46 AM  BMP  Glucose 70 - 99 mg/dL 615   BUN 8 - 23 mg/dL 73   Creatinine 9.38 - 1.24 mg/dL 6.51   Sodium 864 - 854 mmol/L 138   Potassium 3.5 - 5.1 mmol/L 4.5   Chloride 98 - 111 mmol/L 98   CO2 22 - 32 mmol/L 21   Calcium  8.9 - 10.3 mg/dL 8.6     Other pertinent labs magnesium  2.6 Troponin 97 > 99  Imaging/Diagnostic Tests: No new imaging  Lorrane Pac, MD 12/02/2024, 7:34 AM  PGY-1, [facility] FPTS Intern pager: [phone], text pages welcome Secure chat group [facility] Teaching Service   "

## 2024-12-02 NOTE — Assessment & Plan Note (Signed)
 CAD/CHF: Holding home coreg , hydral.  BPH: Continue home tamsulosin  GERD: Continue home Protonix 

## 2024-12-02 NOTE — Plan of Care (Signed)
 Evaluated patient at bedside with Dr. Elicia (see note), called wife, Katheryn for updates. Updated regarding EKG showing ischemic changes and plan to hold off on renal biopsy for now. She expressed understanding, all questions answered.

## 2024-12-02 NOTE — Assessment & Plan Note (Signed)
 Hgb trended down slightly, 9.7 > 8.5.  - Hep drip scheduled to stop prior to IR procedure  - Holding home eliquis  - Labs as above

## 2024-12-02 NOTE — Assessment & Plan Note (Addendum)
 Remains afebrile.  Leukocytosis mildly improving, still elevated 21, neutrophil predominant.  No charted output from cholecystostomy tube. No bowel movement yet.  - Pending pancreas bx results - GI and Gen Surg consulted, appreciate recs - Cholecystostomy tube in place. Appreciate IR recs. - Pain regimen: Continue oxycodone  5mg  q8 prn - Bowel regimen: Glycerin  suppository today, 3 doses of lactulose  today. Continue senna and miralax  BID. - labs above

## 2024-12-02 NOTE — Assessment & Plan Note (Addendum)
 BP elevated.  Continued to remain elevated overnight and this morning. - Hydralazine  increased to 50 mg 3 times daily, increase to 100 mg p.o. dose at 5 AM, received 10 mg IV hydralazine  at 730 due to persistently elevated BPs, will plan to resume hydralazine  50 mg 3 times daily

## 2024-12-02 NOTE — Progress Notes (Signed)
 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  CA19-9 113/CEA normal  Patient is status post percutaneous cholecystostomy tube and biliary stent placement   Interval History   - Per bedside RN, plan was for renal biopsy today deferred due to A-fib with RVR and new ST depressions -starting amiodarone  - At time of interview patient was resting in bed comfortably reporting minimal abdominal discomfort - Cytology of pancreatic head mass remains pending   Objective   Vital signs in last 24 hours: Temp:  [97.6 F (36.4 C)-99 F (37.2 C)] 98 F (36.7 C) (12/22 1107) Pulse Rate:  [77-116] 102 (12/22 1107) Resp:  [13-20] 19 (12/22 1107) BP: (119-187)/(64-91) 145/81 (12/22 1336) SpO2:  [95 %-99 %] 99 % (12/22 1107) Weight:  [68.5 kg] 68.5 kg (12/22 0231) Last BM Date : 11/27/24 General:    Resting in bed in no distress Heart: Tachycardic and irregular rhythm; no murmurs Lungs: Respirations even and unlabored, lungs CTA bilaterally Abdomen:  Soft, mild abdominal tenderness in epigastrium and nondistended. Normal bowel sounds. Extremities:  Without edema. Neurologic:  Alert and oriented,  grossly normal neurologically. Psych:  Cooperative. Normal mood and affect.  Intake/Output from previous day: 12/21 0701 - 12/22 0700 In: 980 [P.O.:717; I.V.:253] Out: 600 [Urine:600] Intake/Output this shift: Total I/O In: 240 [P.O.:240] Out: 720 [Urine:700; Drains:20]  Lab Results: Recent Labs    11/30/24 0938 12/01/24 0343 12/02/24 0346  WBC 15.6* 25.6* 21.0*  HGB 10.4* 9.7* 8.5*  HCT 31.1* 29.2* 26.0*  PLT 434* 449* 366   BMET Recent Labs    11/30/24 0938 12/01/24 0343 12/02/24 0346  NA 142 139 138  K 5.0 4.3 4.5  CL 101 98 98  CO2 32 27 21*  GLUCOSE  171* 332* 384*  BUN 52* 61* 73*  CREATININE 2.92* 3.17* 3.48*  CALCIUM  9.2 8.8* 8.6*   LFT Recent Labs    12/01/24 0343  PROT 5.9*  ALBUMIN  2.8*  AST 19  ALT 52*  ALKPHOS 620*  BILITOT 0.8   PT/INR Recent Labs    12/01/24 0343  LABPROT 16.1*  INR 1.2   CA 19-9 113/CEA normal   Cytology of pancreatic mass remains pending  Studies/Results: No results found.  Endoscopic Studies: EUS 11/28/2024 irregular masslike region identified in the pancreatic head homogeneous and more hypoechoic than the remaining pancreatic parenchyma this measured 3.3 x 2.3 cm.  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  Has minimal abdominal discomfort.  Cytology remains pending.   Plan  Follow-up cytology from EUS Continue to monitor liver enzymes Continue oxycodone  for pain management Continue bowel regimen with senna, MiraLAX  and  glycerin  suppositories Continue pantoprazole  40 mg IV twice daily Continue Carafate  1 g p.o. twice daily    LOS: 13 days   Inocente CHRISTELLA Hausen  12/02/2024, 1:48 PM  Inocente Hausen, MD [facility]  "

## 2024-12-02 NOTE — Plan of Care (Addendum)
 Spoke with patient and wife Katheryn at bedside with daughter Heather over phone.  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 Wilhelmenia who will plan to follow-up on the ERCP stent exchange in 3 to 4 months if not a surgical candidate.  Consulted oncology regarding further pancreatic adenocarcinoma workup including imaging and surgical candidacy. Dr. Autumn will plan to see him tomorrow. Family expressed understanding and optimism regarding the patient's course.  Dr. Fairy Amy, MD [facility] Family Medicine Resident, PGY-1

## 2024-12-02 NOTE — Progress Notes (Signed)
 "  Rounding Note   Patient Name: Colin Keller Date of Encounter: 12/02/2024  [facility] Cardiologist: Redell Shallow, MD   Subjective  Called to bedside by RN for abnormal EKG. Pt converted back to Afib with RVR this morning ~ 0815. He is generally asymptomatic and denies CP.  Scheduled Meds:  acetaminophen   650 mg Oral QID   amiodarone   150 mg Intravenous Once   Chlorhexidine  Gluconate Cloth  6 each Topical Daily   feeding supplement  1 Container Oral TID BM   hydrALAZINE   50 mg Oral Q8H   insulin  aspart  0-9 Units Subcutaneous TID WC   isosorbide  mononitrate  60 mg Oral Daily   lidocaine   3 patch Transdermal Q24H   multivitamin with minerals  1 tablet Oral Daily   pantoprazole  (PROTONIX ) IV  40 mg Intravenous Q12H   polyethylene glycol  17 g Oral BID   senna  1 tablet Oral BID   sodium chloride  flush  5 mL Intracatheter Q8H   sucralfate   1 g Oral BID   tamsulosin   0.4 mg Oral Daily   Continuous Infusions:  amiodarone      Followed by   amiodarone      heparin      PRN Meds: alum & mag hydroxide-simeth, artificial tears, melatonin, oxyCODONE , prochlorperazine , tetracaine    Vital Signs  Vitals:   12/02/24 0714 12/02/24 0719 12/02/24 0729 12/02/24 0732  BP: (!) 163/73 (!) 160/71 (!) 160/71   Pulse: 92 91  90  Resp: 20 17  16   Temp: 97.6 F (36.4 C) 98.7 F (37.1 C)    TempSrc: Oral Oral    SpO2: 95% 95%    Weight:      Height:        Intake/Output Summary (Last 24 hours) at 12/02/2024 0929 Last data filed at 12/02/2024 0839 Gross per 24 hour  Intake 739.99 ml  Output 1320 ml  Net -580.01 ml      12/02/2024    2:31 AM 12/01/2024    3:51 AM 11/28/2024    2:57 PM  Last 3 Weights  Weight (lbs) 151 lb 0.2 oz 151 lb 10.8 oz 148 lb  Weight (kg) 68.5 kg 68.8 kg 67.132 kg      Telemetry SR with HR 70s --> Afib with RVR rates in the 140s at approximately 0815 today - Personally Reviewed  ECG  No new tracings - Personally Reviewed  Physical  Exam  GEN: No acute distress.   Neck: + JVD Cardiac: irregular rhythm, tachycardic rate Respiratory: wheezing and rhonchi throughout GI: Soft, nontender, non-distended  MS: B LE 1-2+ edema Neuro:  Nonfocal  Psych: Normal affect   Labs High Sensitivity Troponin:   Recent Labs  Lab 11/15/24 1708 11/15/24 1908 11/22/24 1735  TROPONINIHS 95* 87* 25*    Recent Labs  Lab 11/27/24 1421 11/27/24 1642 11/30/24 1220 11/30/24 1339  TRNPT 45* 46* 62* 59*       Chemistry Recent Labs  Lab 11/29/24 0805 11/30/24 0938 12/01/24 0343 12/02/24 0346  NA 136 142 139 138  K 4.5 5.0 4.3 4.5  CL 103 101 98 98  CO2 22 32 27 21*  GLUCOSE 141* 171* 332* 384*  BUN 53* 52* 61* 73*  CREATININE 2.77* 2.92* 3.17* 3.48*  CALCIUM  8.8* 9.2 8.8* 8.6*  MG  --  2.3 2.4 2.6*  PROT 5.8* 6.0* 5.9*  --   ALBUMIN  2.7* 2.8* 2.8*  --   AST 18 20 19   --   ALT  74* 65* 52*  --   ALKPHOS 704* 678* 620*  --   BILITOT 0.8 0.8 0.8  --   GFRNONAA 24* 22* 20* 18*  ANIONGAP 11 9 14  19*    Lipids No results for input(s): CHOL, TRIG, HDL, LABVLDL, LDLCALC, CHOLHDL in the last 168 hours.  Hematology Recent Labs  Lab 11/30/24 9061 12/01/24 0343 12/02/24 0346  WBC 15.6* 25.6* 21.0*  RBC 3.34* 3.13* 2.72*  HGB 10.4* 9.7* 8.5*  HCT 31.1* 29.2* 26.0*  MCV 93.1 93.3 95.6  MCH 31.1 31.0 31.3  MCHC 33.4 33.2 32.7  RDW 15.3 15.6* 15.7*  PLT 434* 449* 366   Thyroid  Recent Labs  Lab 11/27/24 1421  TSH 2.480    BNPNo results for input(s): BNP, PROBNP in the last 168 hours.  DDimer No results for input(s): DDIMER in the last 168 hours.   Radiology  DG CHEST PORT 1 VIEW Result Date: 11/30/2024 CLINICAL DATA:  Shortness of breath. EXAM: PORTABLE CHEST 1 VIEW COMPARISON:  Chest [place]  dated 11/15/2024. FINDINGS: No focal consolidation, pleural effusion or pneumothorax. Mild central vascular congestion. The cardiac silhouette. Median sternotomy wires. No acute osseous pathology.  IMPRESSION: Mild central vascular congestion. No focal consolidation. Electronically Signed   By: Vanetta Chou M.D.   On: 11/30/2024 12:27    Cardiac Studies  Echo 11/27/24:  1. Left ventricular ejection fraction, by estimation, is 40 to 45%. The  left ventricle has mildly decreased function. The left ventricle  demonstrates global hypokinesis. There is mild concentric left ventricular  hypertrophy. Left ventricular diastolic  function could not be evaluated.   2. Right ventricular systolic function is moderately reduced.   3. Left atrial size was moderately dilated.   4. The mitral valve is degenerative. Trivial mitral valve regurgitation.  Severe mitral annular calcification.   5. The aortic valve is tricuspid. There is mild calcification of the  aortic valve. Aortic valve sclerosis/calcification is present, without any  evidence of aortic stenosis.    Patient Profile   68 y.o. male with a history of CAD s/p CABG (2019), PAD s/p left SFA angioplasty (2023), CKDIV, carotid artery disease who presented biliary sepsis s/p ERCP stenting (12/12) and cholecystostomy (12/14) and hospitalization c/b PE (12/5), new onset Afib with RVR (12/17) and anemia requiring 1 unit of pRBCs while on Eliquis  (12/17). Repeat TTE 12/17 with LVEF 40-45% (from 50-55% 12/13) in setting of Afib with RVR. He was started on amio gtt on 12/20 given inability to tolerate PO intake and had a brief interrupt in heparin  gtt at that time. He converted to NSR on 12/21. Hydralazine  was held on 12/20.   Converted back to Afib RVR 12/02/24 at ~0815 with abnormal EKG with ST changes, no chest pain. Renal biopsy scheduled 12/02/24 canceled due to RVR and HTN. Heparin  gtt restarted.   Assessment & Plan   Afib with RVR - initially converted on IV amiodarone , D/C'ed yesterday with plans for PO cardizem  - has not received - converted back to Afib RVR this morning with rates in the 140s, improved with IV lopressor , but still  120s and hypertensive - will restart IV amiodarone , renal biopsy canceled for today, IV heparin  restarted - I will also start coreg  6.25 mg BID   Hypertension - received 100 mg hydralazine  x 1 dose this morning followed by IV 10 mg hydralazine  - I will restart home 50 mg hydralazine  TID and start coreg  6.25 mg BID - continue 60 mg imdur    Abnormal EKG CAD  s/p CABG x 3 in 2019 - no chest pain at the time of his MI, no chest pain today - EKG with ST depression anterior leads, not seen on prior EKG with Afib RVR - heparin  restarted (held for renal biopsy) - will continue to follow, trend troponin - will be abnormal given RVR   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 - nephrology following - heparin  gtt temporarily held for renal biopsy, now canceled today given RVR and HTN   Acute on chronic systolic heart failure Moderate RV failure - LVEF 40-45% with moderately reduced RV function Severe MAC - GDMT with hydralazine  50 mg TID, imdur  60 mg, will start coreg  6.25 mg BID - volume up, will defer diuretic to nephrology   PE - diagnosed 11/15/24 --> OAC   DM with hyperglycemia - A1c 9.6% - per primary      For questions or updates, please contact [facility] Please consult [email or web address] for contact info under       Signed, Jon Nat Hails, PA  12/02/2024, 9:29 AM    "

## 2024-12-02 NOTE — Assessment & Plan Note (Signed)
-   Continue tetracaine  0.5% ophthalmic anesthetic eye drops - Continue protective eye patch

## 2024-12-02 NOTE — Progress Notes (Signed)
 PHARMACY - ANTICOAGULATION CONSULT NOTE  Pharmacy Consult for IV heparin  Indication: pulmonary embolus (11/15/24)  Allergies[1]  Patient Measurements: Height: 5' 10 (177.8 cm) Weight: 68.5 kg (151 lb 0.2 oz) IBW/kg (Calculated) : 73 HEPARIN  DW (KG): 67.1  Vital Signs: Temp: 98.7 F (37.1 C) (12/22 0719) Temp Source: Oral (12/22 0719) BP: 160/71 (12/22 0729) Pulse Rate: 90 (12/22 0732)  Labs: Recent Labs    11/30/24 0938 11/30/24 2040 12/01/24 0343 12/02/24 0346  HGB 10.4*  --  9.7* 8.5*  HCT 31.1*  --  29.2* 26.0*  PLT 434*  --  449* 366  LABPROT  --   --  16.1*  --   INR  --   --  1.2  --   HEPARINUNFRC  --  0.44 0.46 0.47  CREATININE 2.92*  --  3.17* 3.48*    Estimated Creatinine Clearance: 19.1 mL/min (A) (by C-G formula based on SCr of 3.48 mg/dL (H)).  Assessment: Colin Keller is a 68 y.o. male admitted on 11/19/2024 with concern for cholecystitis s/p ERCP w/ stenting on 12/12 and percutaneous cholecystostomy by IR on 12/14. Recently diagnosed with new PE (11/15/24) and started on eliquis . Last dose eliquis  prior to admission 12/8 @ 9:30 pm. Pharmacy consulted to restart heparin  while apixaban  on hold pending need for renal biopsy.    Heparin  stopped 12/22 at 0400 in anticipation of renal biopsy, however, patient went into afib w/ RVR.  Biopsy was cancelled and patient was restarted on heparin  and amio infusions.  Patient was previously therapeutic on heparin  at 1100 units/hr.  Goal of Therapy:  Heparin  level 0.3-0.7 units/ml aPTT 66-102 seconds Monitor platelets by anticoagulation protocol: Yes   Plan:  Restart heparin  infusion at 1100 units/hr Check heparin  level in 8 hours. Monitor daily heparin  level, CBC, signs/symptoms of bleeding  F/u plans to resume apixaban  after renal biopsy   Suzen Lovelace, Pharm.D., BCPS Clinical Pharmacist Clinical phone for 12/02/2024 from 7:30-3:00 is [phone].  **Pharmacist phone directory can be found on [email or web address]  listed under [facility].  12/02/2024 9:48 AM         [1]  Allergies Allergen Reactions   Amlodipine  Other (See Comments)    Leg swelling   Empagliflozin  Other (See Comments)    Euglycemic DKA   Amitriptyline  Other (See Comments)    Urinary retention   Lisinopril  Other (See Comments)    Hyperkalemia    Nortriptyline  Hcl Other (See Comments)    Vivid / bad dreams   Statins     Aches in Joints    Simvastatin  Other (See Comments)    Arthralgias

## 2024-12-02 NOTE — Plan of Care (Signed)
 Spoke with patient and wife Katheryn at bedside and daughter Heather over the phone.  They had several questions regarding the events that occurred this morning regarding his heart findings.  I educated them regarding atrial fibrillation and its potential impacts on the heart, and discussed that labs so far looked reassuring and we suspect that the heart scare this morning was due to his atrial fibrillation.  Reviewed endoscopic procedure results and recent GI note with family.  They understand that the primary lab pending at this point is biopsy cytology which has yet to result but that on the endoscopy there was concern for adenocarcinoma.  Family had questions regarding interventions and treatments which were deferred to specialist.  Dr. Fairy Amy, MD [facility] Family Medicine Resident, PGY-1

## 2024-12-02 NOTE — Care Management Important Message (Signed)
 Important Message  Patient Details  Name: Colin Keller MRN: [id number] Date of Birth: 01-04-1956   Important Message Given:  Yes - Medicare IM     [name] 12/02/2024, 10:13 AM

## 2024-12-02 NOTE — Progress Notes (Signed)
 " [facility] Progress Note    Assessment/ Plan:   AKI on CKD3B -baseline Cr ~1.9-2.2, AKI possibly prerenal injury.  AIN was possible.  Creatinine has overall improved but is fluctuating.  Peak Cr ~4.1 -He has nephrotic range proteinuria but not definitive nephrotic syndrome. 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 Most likely diabetic in nature but needs biopsy to rule out other causes definitively - Plan for kidney biopsy today with IR.  Appreciate help -Supportive care for AKI at this point -After biopsy patient is okay for discharge and follow-up outpatient from nephrology perspective  Biliary obstruction Elevated LFTs-improving -s/p ERCP with stent, s/p chole tube w/ IR -GI and gen surg following -zosyn  switched to rocephin  and flagyl  -s/p EGD 1216, EUS reattempt 12/18: pancreatic mass found--likely pancreatic cancer with portal vein involvement--following.  Follow-up results  Afib with RVR -cardiology following -on amio and anticoagulation  HTN -on hydral -consider bb and/or norvasc  -consider arb if Crt remains stable  Uncontrolled DM2 with hyperglycemia -mgmt per primary service  PE -per primary service, A/C per primary -if primary GN found this may have been a contributor   Anemia -transfuse prn -would not entertain ESA at this time given possible pancreatic adenocarcinoma   Subjective:   Having some back pain, reflux, nausea, abdominal distention that he feels is related to constipation.  Planning for biopsy today.  No other complaints   Objective:   BP (!) 160/71   Pulse 90   Temp 98.7 F (37.1 C) (Oral)   Resp 16   Ht 5' 10 (1.778 m)   Wt 68.5 kg   SpO2 95%   BMI 21.67 kg/m   Intake/Output Summary (Last 24 hours) at 12/02/2024 0940 Last data filed at 12/02/2024 9160 Gross per 24 hour  Intake 739.99 ml  Output 1320 ml  Net -580.01 ml   Weight change: -0.3 kg  Physical Exam: Gen: NAD, lying in bed CVS: Normal rate, no  rub Resp: normal wob, unlabored Abd: soft, mild to redness to palpation, mildly distended Ext: no edema Neuro: awake/alert  Imaging: DG CHEST PORT 1 VIEW Result Date: 11/30/2024 CLINICAL DATA:  Shortness of breath. EXAM: PORTABLE CHEST 1 VIEW COMPARISON:  Chest [place]  dated 11/15/2024. FINDINGS: No focal consolidation, pleural effusion or pneumothorax. Mild central vascular congestion. The cardiac silhouette. Median sternotomy wires. No acute osseous pathology. IMPRESSION: Mild central vascular congestion. No focal consolidation. Electronically Signed   By: Vanetta Chou M.D.   On: 11/30/2024 12:27    Labs: BMET Recent Labs  Lab 11/26/24 0440 11/27/24 0406 11/28/24 0506 11/29/24 0805 11/30/24 0938 12/01/24 0343 12/02/24 0346  NA 137 139 135 136 142 139 138  K 3.8 4.3 4.3 4.5 5.0 4.3 4.5  CL 106 106 103 103 101 98 98  CO2 22 23 23 22  32 27 21*  GLUCOSE 130* 120* 202* 141* 171* 332* 384*  BUN 75* 66* 58* 53* 52* 61* 73*  CREATININE 3.91* 3.38* 3.05* 2.77* 2.92* 3.17* 3.48*  CALCIUM  7.9* 8.5* 8.4* 8.8* 9.2 8.8* 8.6*  PHOS  --   --  4.2  --   --   --   --    CBC Recent Labs  Lab 11/29/24 0805 11/30/24 0938 12/01/24 0343 12/02/24 0346  WBC 13.5* 15.6* 25.6* 21.0*  NEUTROABS 9.8*  --   --  19.3*  HGB 9.8* 10.4* 9.7* 8.5*  HCT 29.4* 31.1* 29.2* 26.0*  MCV 94.5 93.1 93.3 95.6  PLT 365 434* 449* 366  Medications:     acetaminophen   650 mg Oral QID   amiodarone   150 mg Intravenous Once   carvedilol   6.25 mg Oral BID WC   Chlorhexidine  Gluconate Cloth  6 each Topical Daily   feeding supplement  1 Container Oral TID BM   hydrALAZINE   50 mg Oral Q8H   insulin  aspart  0-9 Units Subcutaneous TID WC   isosorbide  mononitrate  60 mg Oral Daily   lidocaine   3 patch Transdermal Q24H   multivitamin with minerals  1 tablet Oral Daily   pantoprazole  (PROTONIX ) IV  40 mg Intravenous Q12H   polyethylene glycol  17 g Oral BID   senna  1 tablet Oral BID   sodium chloride   flush  5 mL Intracatheter Q8H   sucralfate   1 g Oral BID   tamsulosin   0.4 mg Oral Daily       "

## 2024-12-02 NOTE — Progress Notes (Signed)
 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 Post -op ERCP 12/12.   OT comments  Pt on BSC upon arrival, overall fatigued and reports back pain. Pt able to complete seated pericare and standing grooming task with supervision-min A during session. Pt tripoding on sink while washing hands due to fatigue, declines further mobility. Pt presenting with impairments listed below, will follow acutely. Recommend HHOT at d/c pending progression.      If plan is discharge home, recommend the following:  A little help with walking and/or transfers;A little help with bathing/dressing/bathroom;Help with stairs or ramp for entrance;Assistance with cooking/housework;Assist for transportation   Equipment Recommendations  None recommended by OT    Recommendations for Other Services      Precautions / Restrictions Precautions Precaution/Restrictions Comments: RUQ drain Restrictions Weight Bearing Restrictions Per Provider Order: No       Mobility Bed Mobility Overal bed mobility: Modified Independent                  Transfers Overall transfer level: Modified independent                       Balance Overall balance assessment: Mild deficits observed, not formally tested                                         ADL either performed or assessed with clinical judgement   ADL Overall ADL's : Needs assistance/impaired     Grooming: Wash/dry hands;Minimal assistance;Standing Grooming Details (indicate cue type and reason): tripoding on sink                 Toilet Transfer:  Supervision/safety;Stand-pivot;BSC/3in1   Psychiatrist and Hygiene: Supervision/safety Toileting - Clothing Manipulation Details (indicate cue type and reason): seated pericare on [facility]            Extremity/Trunk Assessment Upper Extremity Assessment Upper Extremity Assessment: Generalized weakness   Lower Extremity Assessment Lower Extremity Assessment: Defer to PT evaluation        Vision   Vision Assessment?: No apparent visual deficits   Perception Perception Perception: Not tested   Praxis Praxis Praxis: Not tested   Communication Communication Communication: No apparent difficulties   Cognition Arousal: Alert Behavior During Therapy: WFL for tasks assessed/performed, Flat affect Cognition: No apparent impairments                               Following commands: Intact        Cueing   Cueing Techniques: Verbal cues  Exercises      Shoulder Instructions       General Comments VSS on RA    Pertinent Vitals/ Pain       Pain Assessment Pain Assessment: Faces Pain Score: 2  Faces Pain Scale: Hurts a little bit Pain Location: back Pain Descriptors / Indicators: Discomfort Pain Intervention(s): Limited activity within patient's tolerance, Monitored during session  Home Living  Prior Functioning/Environment              Frequency  Min 2X/week        Progress Toward Goals  OT Goals(current goals can now be found in the care plan section)  Progress towards OT goals: Progressing toward goals     Plan      Co-evaluation                 AM-PAC OT 6 Clicks Daily Activity     Outcome Measure   Help from another person eating meals?: None Help from another person taking care of personal grooming?: A Little Help from another person toileting, which includes using toliet, bedpan, or urinal?: A Little Help from another person bathing  (including washing, rinsing, drying)?: A Little Help from another person to put on and taking off regular upper body clothing?: A Little Help from another person to put on and taking off regular lower body clothing?: A Little 6 Click Score: 19    End of Session    OT Visit Diagnosis: Muscle weakness (generalized) (M62.81)   Activity Tolerance Patient tolerated treatment well   Patient Left in bed;with call bell/phone within reach;with bed alarm set;with family/visitor present   Nurse Communication Mobility status (IV beeping)        Time: 8459-8397 OT Time Calculation (min): 22 min  Charges: OT General Charges $OT Visit: 1 Visit OT Treatments $Self Care/Home Management : 8-22 mins  [place] K, OTD, OTR/L SecureChat Preferred Acute Rehab [phone]   Laneta POUR Koonce 12/02/2024, 4:39 PM

## 2024-12-02 NOTE — Assessment & Plan Note (Addendum)
 Received total of 25 units sliding scale, CBGs persistently greater than 300 - Plan to restart 10 units glargine - Cont SSI - Hold home metformin 

## 2024-12-02 NOTE — Inpatient Diabetes Management (Signed)
 Inpatient Diabetes Program Recommendations  AACE/ADA: New Consensus Statement on Inpatient Glycemic Control (2015)  Target Ranges:  Prepandial:   less than 140 mg/dL      Peak postprandial:   less than 180 mg/dL (1-2 hours)      Critically ill patients:  140 - 180 mg/dL   Lab Results  Component Value Date   GLUCAP 390 (H) 12/02/2024   HGBA1C 9.6 (H) 11/15/2024    Latest Reference Range & Units 12/01/24 06:08 12/01/24 11:25 12/01/24 15:38 12/01/24 21:15 12/02/24 06:13  Glucose-Capillary 70 - 99 mg/dL 653 (H) 689 (H) 820 (H) 231 (H) 390 (H)   Review of Glycemic Control  Diabetes history: DM2  Outpatient Diabetes medications:   Basaglar  10-12 units every day Metformin  1000 mg BID Humalog  3-5 units TID   Current orders for Inpatient glycemic control:  Novolog  0-9 units TID   Inpatient Diabetes Program Recommendations:   Noted:  - hypoglycemia on 11/29/24 and Lantus  was discontinued.  - CBG 346mg /dL yesterday morning and 390mg /dL this morning.   Please consider starting Lantus  12 units daily.   Thanks,  Lavanda Search, RN, MSN, [facility]  Inpatient Diabetes Coordinator  Pager [phone] (8a-5p)

## 2024-12-02 NOTE — Assessment & Plan Note (Addendum)
 Creatinine stably elevated. Suspect diabetic nephropathy, but is undergoing kidney Bx w/ IR and nephrology for further workup. Look more volume overloaded today. - Nephrology following, appreciate their recommendations, will touch base regarding indications for biopsy - IR planning for kidney biopsy today will be rescheduled with rate control.   - Heparin  drip resumed  - Will need to control BP <150/90 per IR recs (see below for HTN management) - holding hydrochlorothiazide  - AM BMP, CBC, mag

## 2024-12-02 NOTE — Progress Notes (Signed)
 PHARMACY - ANTICOAGULATION CONSULT NOTE  Pharmacy Consult for IV heparin  Indication: pulmonary embolus (11/15/24)  Allergies[1]  Patient Measurements: Height: 5' 10 (177.8 cm) Weight: 68.5 kg (151 lb 0.2 oz) IBW/kg (Calculated) : 73 HEPARIN  DW (KG): 67.1  Vital Signs: Temp: 97.8 F (36.6 C) (12/22 1940) Temp Source: Oral (12/22 1940) BP: 104/71 (12/22 1940) Pulse Rate: 78 (12/22 1940)  Labs: Recent Labs    11/30/24 0938 11/30/24 2040 12/01/24 0343 12/02/24 0346 12/02/24 1855  HGB 10.4*  --  9.7* 8.5*  --   HCT 31.1*  --  29.2* 26.0*  --   PLT 434*  --  449* 366  --   LABPROT  --   --  16.1*  --   --   INR  --   --  1.2  --   --   HEPARINUNFRC  --    < > 0.46 0.47 0.22*  CREATININE 2.92*  --  3.17* 3.48*  --    < > = values in this interval not displayed.    Estimated Creatinine Clearance: 19.1 mL/min (A) (by C-G formula based on SCr of 3.48 mg/dL (H)).  Assessment: Colin Keller is a 68 y.o. male admitted on 11/19/2024 with concern for cholecystitis s/p ERCP w/ stenting on 12/12 and percutaneous cholecystostomy by IR on 12/14. Recently diagnosed with new PE (11/15/24) and started on eliquis . Last dose eliquis  prior to admission 12/8 @ 9:30 pm. Pharmacy consulted to restart heparin  while apixaban  on hold pending need for renal biopsy.    Heparin  level ~8 hours after restart is 0.22 and is subtherapeutic on 1100 units/hr. Patient was consistently at ~0.45 on this dose prior to heparin  being held for procedure. Likely not at steady state.   Goal of Therapy:  Heparin  level 0.3-0.7 units/ml aPTT 66-102 seconds Monitor platelets by anticoagulation protocol: Yes   Plan:  Continue Heparin  infusion 1100 units/hr Monitor daily heparin  level, CBC, signs/symptoms of bleeding  F/u plans to resume apixaban  after renal biopsy  Jinnie Door, PharmD, BCPS, BCCP Clinical Pharmacist  Please check AMION for all [facility] Pharmacy phone numbers After 10:00 PM, call Main Pharmacy  [phone]      [1]  Allergies Allergen Reactions   Amlodipine  Other (See Comments)    Leg swelling   Empagliflozin  Other (See Comments)    Euglycemic DKA   Amitriptyline  Other (See Comments)    Urinary retention   Lisinopril  Other (See Comments)    Hyperkalemia    Nortriptyline  Hcl Other (See Comments)    Vivid / bad dreams   Statins     Aches in Joints    Simvastatin  Other (See Comments)    Arthralgias

## 2024-12-03 DIAGNOSIS — R918 Other nonspecific abnormal finding of lung field: Secondary | ICD-10-CM

## 2024-12-03 DIAGNOSIS — C259 Malignant neoplasm of pancreas, unspecified: Secondary | ICD-10-CM | POA: Insufficient documentation

## 2024-12-03 LAB — BPAM RBC
Blood Product Expiration Date: 202512302359
Unit Type and Rh: 600

## 2024-12-03 LAB — CBC
HCT: 24.3 % — ABNORMAL LOW (ref 39.0–52.0)
HCT: 25.2 % — ABNORMAL LOW (ref 39.0–52.0)
Hemoglobin: 8 g/dL — ABNORMAL LOW (ref 13.0–17.0)
Hemoglobin: 8.5 g/dL — ABNORMAL LOW (ref 13.0–17.0)
MCH: 31.4 pg (ref 26.0–34.0)
MCH: 31.8 pg (ref 26.0–34.0)
MCHC: 32.9 g/dL (ref 30.0–36.0)
MCHC: 33.7 g/dL (ref 30.0–36.0)
MCV: 95.3 fL (ref 80.0–100.0)
MCV: 94.4 fL (ref 80.0–100.0)
Platelets: 387 K/uL (ref 150–400)
Platelets: 404 K/uL — ABNORMAL HIGH (ref 150–400)
RBC: 2.55 MIL/uL — ABNORMAL LOW (ref 4.22–5.81)
RBC: 2.67 MIL/uL — ABNORMAL LOW (ref 4.22–5.81)
RDW: 15.7 % — ABNORMAL HIGH (ref 11.5–15.5)
RDW: 15.8 % — ABNORMAL HIGH (ref 11.5–15.5)
WBC: 16.6 K/uL — ABNORMAL HIGH (ref 4.0–10.5)
WBC: 17.1 K/uL — ABNORMAL HIGH (ref 4.0–10.5)
nRBC: 0 % (ref 0.0–0.2)
nRBC: 0 % (ref 0.0–0.2)

## 2024-12-03 LAB — GLUCOSE, CAPILLARY
Glucose-Capillary: 247 mg/dL — ABNORMAL HIGH (ref 70–99)
Glucose-Capillary: 291 mg/dL — ABNORMAL HIGH (ref 70–99)
Glucose-Capillary: 250 mg/dL — ABNORMAL HIGH (ref 70–99)
Glucose-Capillary: 231 mg/dL — ABNORMAL HIGH (ref 70–99)

## 2024-12-03 LAB — TYPE AND SCREEN
ABO/RH(D): A POS
Antibody Screen: NEGATIVE
Unit division: 0

## 2024-12-03 LAB — BASIC METABOLIC PANEL WITH GFR
Anion gap: 13 (ref 5–15)
BUN: 75 mg/dL — ABNORMAL HIGH (ref 8–23)
CO2: 25 mmol/L (ref 22–32)
Calcium: 8.5 mg/dL — ABNORMAL LOW (ref 8.9–10.3)
Chloride: 100 mmol/L (ref 98–111)
Creatinine, Ser: 3.45 mg/dL — ABNORMAL HIGH (ref 0.61–1.24)
GFR, Estimated: 18 mL/min — ABNORMAL LOW
Glucose, Bld: 244 mg/dL — ABNORMAL HIGH (ref 70–99)
Potassium: 4.3 mmol/L (ref 3.5–5.1)
Sodium: 138 mmol/L (ref 135–145)

## 2024-12-03 LAB — PREPARE RBC (CROSSMATCH)

## 2024-12-03 LAB — HEPARIN LEVEL (UNFRACTIONATED)
Heparin Unfractionated: 0.22 [IU]/mL — ABNORMAL LOW (ref 0.30–0.70)
Heparin Unfractionated: 0.33 [IU]/mL (ref 0.30–0.70)

## 2024-12-03 LAB — PROTIME-INR
INR: 1.2 (ref 0.8–1.2)
Prothrombin Time: 16.3 s — ABNORMAL HIGH (ref 11.4–15.2)

## 2024-12-03 LAB — MAGNESIUM: Magnesium: 2.6 mg/dL — ABNORMAL HIGH (ref 1.7–2.4)

## 2024-12-03 MED ORDER — OXYCODONE HCL 5 MG PO TABS
5.0000 mg | ORAL_TABLET | Freq: Once | ORAL | Status: AC
  Administered 2024-12-03: 5 mg via ORAL
  Filled 2024-12-03: qty 1

## 2024-12-03 MED ORDER — GLYCERIN (LAXATIVE) 2 G RE SUPP
1.0000 | Freq: Once | RECTAL | Status: AC
  Administered 2024-12-03: 1 via RECTAL
  Filled 2024-12-03: qty 1

## 2024-12-03 MED ORDER — LACTULOSE 10 GM/15ML PO SOLN
10.0000 g | Freq: Two times a day (BID) | ORAL | Status: DC
  Administered 2024-12-03 – 2024-12-05 (×4): 10 g via ORAL
  Filled 2024-12-03 (×4): qty 15

## 2024-12-03 MED ORDER — SODIUM CHLORIDE 0.9% IV SOLUTION: Freq: Once | INTRAVENOUS | Status: DC

## 2024-12-03 MED ORDER — HEPARIN (PORCINE) 25000 UT/250ML-% IV SOLN
1200.0000 [IU]/h | INTRAVENOUS | Status: DC
  Administered 2024-12-03: 1200 [IU]/h via INTRAVENOUS
  Filled 2024-12-03: qty 250

## 2024-12-03 MED ORDER — INSULIN GLARGINE 100 UNIT/ML ~~LOC~~ SOLN
10.0000 [IU] | Freq: Every day | SUBCUTANEOUS | Status: DC
  Administered 2024-12-03 – 2024-12-04 (×2): 10 [IU] via SUBCUTANEOUS
  Filled 2024-12-03 (×2): qty 0.1

## 2024-12-03 NOTE — Progress Notes (Addendum)
 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 Post -op ERCP 12/12.    PT Comments  Pt resting in bed on arrival, agreeable to session, however limited by fatigue and low spirits. Pt open to chaplin and palliative consult with RN aware and to order. Pt able to come to sitting EOB without assist and maintain sitting balance. Pt requiring light min A to boost to stand and CGA to step pivot EOB>BSC. Pt declining further exercise or gait due to fatigue and pt stated low spirits. Encouraged pt to mobilize frequently to maintain functional mobility and prevent weakness with pt verbalizing understanding. Pt continues to benefit from skilled PT services to progress toward functional mobility goals.     If plan is discharge home, recommend the following: Assistance with cooking/housework;Assist for transportation;A little help with bathing/dressing/bathroom   Can travel by private vehicle        Equipment Recommendations  None recommended by PT    Recommendations for Other Services       Precautions / Restrictions Precautions Precautions: None Recall of Precautions/Restrictions: Intact Precaution/Restrictions Comments: RUQ drain Restrictions Weight Bearing Restrictions Per Provider Order: No     Mobility  Bed Mobility Overal bed mobility: Modified Independent             General bed mobility comments: HOB elevated    Transfers Overall transfer level: Needs assistance Equipment used: None Transfers: Sit to/from Stand, Bed to chair/wheelchair/BSC Sit to Stand: Min assist   Step pivot transfers: Contact guard assist       General transfer  comment: pt requiring light min A to boost to stand from EOB, CGA to step pivot EOB>BSC    Ambulation/Gait               General Gait Details: pt declining   Stairs             Wheelchair Mobility     Tilt Bed    Modified Rankin (Stroke Patients Only)       Balance Overall balance assessment: Mild deficits observed, not formally tested Sitting-balance support: Feet supported Sitting balance-Leahy Scale: Good     Standing balance support: No upper extremity supported Standing balance-Leahy Scale: Fair                              Hotel Manager: No apparent difficulties  Cognition Arousal: Alert Behavior During Therapy: WFL for tasks assessed/performed, Flat affect                           PT - Cognition Comments: pt with low mood, stating he does not know if he wants to continue with medical treatment, pt open to palliateive and chaplin consult, RN updated and RN to order Following commands: Intact      Cueing Cueing Techniques: Verbal cues  Exercises Other Exercises Other Exercises: pt declining further exercises due to fatigue and back pain    General Comments General comments (skin integrity, edema, etc.): VSS on RA      Pertinent Vitals/Pain Pain Assessment Pain Assessment: Faces Faces Pain Scale: Hurts  little more Pain Location: back Pain Descriptors / Indicators: Discomfort Pain Intervention(s): RN gave pain meds during session, Monitored during session, Limited activity within patient's tolerance    Home Living                          Prior Function            PT Goals (current goals can now be found in the care plan section) Acute Rehab PT Goals PT Goal Formulation: With patient Time For Goal Achievement: 12/05/24 Progress towards PT goals: Not progressing toward goals - comment (fatigue)    Frequency    Min 2X/week      PT Plan      Co-evaluation               AM-PAC PT 6 Clicks Mobility   Outcome Measure  Help needed turning from your back to your side while in a flat bed without using bedrails?: None Help needed moving from lying on your back to sitting on the side of a flat bed without using bedrails?: None Help needed moving to and from a bed to a chair (including a wheelchair)?: A Little Help needed standing up from a chair using your arms (e.g., wheelchair or bedside chair)?: A Little Help needed to walk in hospital room?: A Little Help needed climbing 3-5 steps with a railing? : A Little 6 Click Score: 20    End of Session   Activity Tolerance: Patient tolerated treatment well Patient left: Other (comment);with call bell/phone within reach (on [facility]) Nurse Communication: Mobility status PT Visit Diagnosis: Muscle weakness (generalized) (M62.81)     Time: 8896-8880 PT Time Calculation (min) (ACUTE ONLY): 16 min  Charges:    $Therapeutic Activity: 8-22 mins PT General Charges $$ ACUTE PT VISIT: 1 Visit                     Sakeena Teall R. PTA Acute Rehabilitation Services Office: [phone]   Therisa CHRISTELLA Boor 12/03/2024, 11:48 AM

## 2024-12-03 NOTE — Assessment & Plan Note (Signed)
 WBCs downtrending to 16.6 today. Afebrile, VSS. Perc chole tube in place. -Pancreatic biopsies confirming adenocarcinoma, Dr Autumn to see today.  -GI following, appreciate their recs -Continue pantoprazole  40mg  BID -Continue carafate  1g BID -Continue oxycodone  5mg  q8 prn - GI and Gen Surg consulted, appreciate recs - Cholecystostomy tube in place. Appreciate IR recs. 1 BM last 24 hours, first in multiple days. S/p 1 glycerin  suppository.  -Continue lactulose  BID today given NPO, can increase to TID moving forward as needed -D/c senna and miralax 

## 2024-12-03 NOTE — Assessment & Plan Note (Signed)
-   Continue tetracaine  0.5% ophthalmic anesthetic eye drops - Continue protective eye patch

## 2024-12-03 NOTE — Progress Notes (Addendum)
 PHARMACY - ANTICOAGULATION CONSULT NOTE  Pharmacy Consult for IV heparin  Indication: pulmonary embolus (11/15/24)  Allergies[1]  Patient Measurements: Height: 5' 10 (177.8 cm) Weight: 65.1 kg (143 lb 8.3 oz) IBW/kg (Calculated) : 73 HEPARIN  DW (KG): 67.1  Vital Signs: Temp: 98.5 F (36.9 C) (12/23 1618) Temp Source: Oral (12/23 1618) BP: 137/76 (12/23 1618) Pulse Rate: 100 (12/23 1618)  Labs: Recent Labs    12/01/24 0343 12/02/24 0346 12/02/24 1855 12/03/24 0437 12/03/24 1429 12/03/24 1813  HGB 9.7* 8.5*  --  8.0* 8.5*  --   HCT 29.2* 26.0*  --  24.3* 25.2*  --   PLT 449* 366  --  387 404*  --   LABPROT 16.1*  --   --  16.3*  --   --   INR 1.2  --   --  1.2  --   --   HEPARINUNFRC 0.46 0.47 0.22* 0.22*  --  0.33  CREATININE 3.17* 3.48*  --  3.45*  --   --     Estimated Creatinine Clearance: 18.3 mL/min (A) (by C-G formula based on SCr of 3.45 mg/dL (H)).  Assessment: Colin Keller is a 68 y.o. male admitted on 11/19/2024 with concern for cholecystitis s/p ERCP w/ stenting on 12/12 and percutaneous cholecystostomy by IR on 12/14. Recently diagnosed with new PE (11/15/24) and started on eliquis . Last dose eliquis  prior to admission 12/8 @ 9:30 pm. Pharmacy consulted to restart heparin  while apixaban  on hold pending need for renal biopsy.    Heparin  level 0.33 is therapeutic on 1200 units/hr.  Goal of Therapy:  Heparin  level 0.3-0.7 units/ml aPTT 66-102 seconds Monitor platelets by anticoagulation protocol: Yes   Plan:  Restart heparin  infusion at 1200 units/hr  Monitor daily heparin  level, CBC, signs/symptoms of bleeding  F/u resume apixaban     Jinnie Door, PharmD, BCPS, BCCP Clinical Pharmacist  Please check AMION for all [facility] Pharmacy phone numbers After 10:00 PM, call Main Pharmacy [phone]     [1]  Allergies Allergen Reactions   Amlodipine  Other (See Comments)    Leg swelling   Empagliflozin  Other (See Comments)    Euglycemic DKA   Amitriptyline   Other (See Comments)    Urinary retention   Lisinopril  Other (See Comments)    Hyperkalemia    Nortriptyline  Hcl Other (See Comments)    Vivid / bad dreams   Statins     Aches in Joints    Simvastatin  Other (See Comments)    Arthralgias

## 2024-12-03 NOTE — Progress Notes (Signed)
 "    Daily Progress Note Intern Pager: [phone]  Patient name: Colin Keller Medical record number: [id number] Date of birth: 1956-03-24 Age: 68 y.o. 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 Now s/p ERCP w/ CBD stent placement and percutaneous cholecystostomy tube placement. EUS w/ pancreatic biopsies found pancreatic adenocarcinoma, Dr Autumn joining his care team today. Recently his stay has been complicated by Afib w/ RVR, which is now stable on amiodarone  drip. He is going for renal biopsy today to complete his workup for AKI during this admission.  Assessment & Plan Coronary artery disease involving native coronary artery of native heart without angina pectoris Atrial fibrillation with RVR (HCC) HTN (hypertension) Rate controlled at the moment. Asymptomatic this AM. Trops flat yesterday. BP WNL last ~12 hours.  -Cards following, appreciate their recs.  -Continue amiodarone  drip -continue imdur  60mg  daily -Continue hydralazine  50mg  PO q8h once taking PO -Continue carvedilol  6.25mg  BID w/ meals -D/c diltiazem  120mg  daily AKI (acute kidney injury) Cr stable but elevated at 3.45. Adequate UOP. Renal biopsy today, BP has been WNL.  -Nephro consulted, appreciate their recs -AM BMP, Mg - holding hydrochlorothiazide  Abdominal pain Biliary stricture (HCC) Biliary sepsis (HCC) Pancreatic lesion WBCs downtrending to 16.6 today. Afebrile, VSS. Perc chole tube in place. -Pancreatic  biopsies confirming adenocarcinoma, Dr Autumn to see today.  -GI following, appreciate their recs -Continue pantoprazole  40mg  BID -Continue carafate  1g BID -Continue oxycodone  5mg  q8 prn - GI and Gen Surg consulted, appreciate recs - Cholecystostomy tube in place. Appreciate IR recs. 1 BM last 24 hours, first in multiple days. S/p 1 glycerin  suppository.  -Continue lactulose  BID today given NPO, can increase to TID moving forward as needed -D/c senna and miralax  Eye pain, left - Continue tetracaine  0.5% ophthalmic anesthetic eye drops - Continue protective eye patch Anemia Acute pulmonary embolism (HCC) Hgb continues to downtrend, now 8.0. No clinical signs of bleeding.  -Consider need for transfusion after biopsy -PM CBC after biopsy -Restart heparin  drip if stable T2DM (type 2 diabetes mellitus) (HCC) CBGs variable last 24 hours. Pt NPO since MN. Caution with excess LAI.  -5u LAI today, consider uptitrating moving forward -Continue sensitive SSI - Hold home metformin  Chronic health problem BPH: Continue home tamsulosin  GERD: Continue home Protonix    FEN/GI: NPO for procedure PPx: Holding heparin  for procedure Dispo:pending clinical improvement   Subjective:   NAEON. He reports having a successful BM yesterday. Denies CP or dyspnea, reports that he did have some yesterday during the day. Denies eye pain this AM. Reports most of his questions have been answered at this time. Discussed some of the particulars of his renal biopsy.   Objective: Temp:  [97.6 F (36.4 C)-98.7 F (37.1 C)] 98.5 F (36.9 C) (12/23 0434) Pulse Rate:  [74-116] 97 (12/23 0434) Resp:  [15-20] 18 (12/23 0434) BP: (104-163)/(31-91) 114/64 (12/23 0434) SpO2:  [95 %-99 %] 98 % (12/23 0434) Weight:  [65.1 kg] 65.1 kg (12/23 0500) Physical Exam: General: Awake,  alert, NAD. Communicates clearly. Cardio: Irregular rhythm, rate WNL. Normal S1, S2. No murmur, rub, gallop. B/l extremities are warm and well  perfused. Mild pedal edema to the ankle R > L, improved vs my prior.  Resp: CTA bilaterally. No wheezes, rales, or rhonchi. Normal work of breathing on room air Abdomen: soft, non-tender, mildly distended. Normoactive BS auscultated. No guarding, rigidity, or rebound.  Neuro: AOx4. No focal deficits.   Laboratory: Most recent CBC Lab Results  Component Value Date   WBC 16.6 (H) 12/03/2024   HGB 8.0 (L) 12/03/2024   HCT 24.3 (L) 12/03/2024   MCV 95.3 12/03/2024   PLT 387 12/03/2024   Most recent BMP    Latest Ref Rng & Units 12/03/2024    4:37 AM  BMP  Glucose 70 - 99 mg/dL 755   BUN 8 - 23 mg/dL 75   Creatinine 9.38 - 1.24 mg/dL 6.54   Sodium 864 - 854 mmol/L 138   Potassium 3.5 - 5.1 mmol/L 4.3   Chloride 98 - 111 mmol/L 100   CO2 22 - 32 mmol/L 25   Calcium  8.9 - 10.3 mg/dL 8.5     Mg 2.6  Imaging/Diagnostic Tests:  Manon Jester, DO 12/03/2024, 7:13 AM  PGY-1, [facility] FPTS Intern pager: [phone], text pages welcome Secure chat group [facility] Teaching Service   "

## 2024-12-03 NOTE — Consult Note (Addendum)
 HEMATOLOGY/ONCOLOGY INPATIENT NOTE:   ADDENDUM:  Patient was personally and independently interviewed, examined and relevant elements of the history of present illness were reviewed in details and an assessment and plan was created. All elements of the patient's history of present illness, assessment and plan were discussed in detail with Colin JINNY Brunner, NP. The above documentation reflects our combined findings assessment and plan.   Briefly, 68 year old gentleman who was admitted to the hospital on 11/19/2024 after he presented with abdominal pain.  He had a prior hospitalization on 11/15/2024 when he was diagnosed with small left lower basilar segmental artery PE.  He was discharged on 11/17/2024 at that time.  On 11/19/2024, CT abdomen and pelvis without contrast showed gallbladder lumen is filled with high density material, likely vicarious excretion of intravenous contrast administered for chest CTA 11/15/2024. Gallbladder wall appears circumferentially thickened with small volume confluent edema in the right upper quadrant of the abdomen adjacent to the gallbladder. Imaging features raise the question of acute cholecystitis. Right upper quadrant ultrasound may prove helpful. Intrahepatic biliary duct dilatation. Common bile duct not well demonstrated on this noncontrast exam. Correlation with liver function test recommended. Soft tissue nodularity in the expected location of the left adrenal land may reflect nodular thickening, small lymph nodes, or vascular anatomy. This is not well evaluated given lack of intravenous contrast material.  Possible borderline lymphadenopathy in the left para-aortic space.  He had an ultrasound of the abdomen on 11/19/2024 which showed gallbladder wall thickening (7 mm) without focal tenderness.  Dilated CBD measuring 13.5 mm, without intrahepatic biliary ductal dilatation.  This led to MRCP on 11/19/2024 without contrast (patient has baseline CKD).  It showed diffuse  biliary ductal dilatation due to distal common bile duct stricture. No evidence of choledocholithiasis. Multiple tiny cystic foci within the pancreatic head at the site of distal common bile duct stricture. This may be due to chronic pancreatitis, however, pancreatic mass cannot definitely be excluded on this unenhanced exam. Recommend further imaging evaluation with pancreatic protocol abdomen CT without and with contrast. Distended gallbladder with mild diffuse wall thickening, which is nonspecific. Differential diagnosis includes cholecystitis and other etiologies such as hypoalbuminemia, liver disease, or heart failure. Anasarca and minimal perihepatic ascites.  His total bilirubin peaked at 4.2 on 11/22/2024.  Alkaline phosphatase has been elevated, with peak of 1158 on 11/24/2024.  Peak AST of 347, peak ALT of 211 on 11/24/2024.  GI was consulted.  On 11/22/2024, he underwent ERCP which showed distal biliary stricture with obstruction.  No evidence of cholangitis or common duct stones.  There was a question if the stricture was part of an obstructing lesion involving the cystic duct.  Normal pancreatic duct.  Endoscopic stent was placed into biliary duct.  Eventually Dr. Wilhelmenia performed upper EUS and biopsy on 11/28/2024.  On EUS, irregular masslike lesion was identified in the pancreatic head, homogeneous and hypoechoic and rest of the remaining pancreatic parenchyma.  Lesion measured 33 mm x 23 mm.  Outer margins were irregular.  There was sonographic evidence suggesting abutment of the portal vein.  An intact interface was seen of the SMA and celiac trunk, suggesting lack of invasion.  FNA was obtained.  No malignant appearing lymph nodes were visualized in the celiac region and peripancreatic region.  T2, N0, MX by endosonographic criteria.  Cytology from FNA did confirm adenocarcinoma.  At this point we were consulted for additional recommendations and management.  On 11/15/2024, CT  angiogram of the chest which showed evidence  of small pulmonary embolism, also showed small lung nodules, 5 mm in the left upper lobe and 8 mm in the right lower lobe.  Will obtain PET scan in the outpatient setting.  CA 19-9 and CEA pending.  We could not obtain imaging with contrast because of his AKI on CKD.  He has chronic kidney disease with acute kidney injury. He recently received contrast exposure. Nephrology is involved, and he is receiving supportive care and hydration. Kidney biopsy was considered but deferred. He is not being retained for kidney issues, and nephrology will intervene only if there is rapid decline. He has had some difficulty with urination.  He developed paroxysmal atrial fibrillation during this hospitalization, described as coming and going since yesterday, and is currently receiving heparin  for anticoagulation for both pulmonary embolism and prophylaxis.  He previously had obstructive jaundice with elevated bilirubin (peak 4.2) due to the pancreatic mass causing biliary stricture. After intervention, bilirubin and liver enzymes have normalized (most recent bilirubin 0.8 on December 21st). Alkaline phosphatase remains elevated but is trending down.  Pancreatic adenocarcinoma Biopsy-proven pancreatic cancer, likely early stage based on imaging and absence of regional lymphadenopathy. Full disease extent remains undefined due to imaging limitations from chronic kidney disease. Surgical resection may be feasible pending further staging and assessment of vascular involvement and distant metastasis.   Neoadjuvant chemotherapy is recommended to attempt tumor shrinkage and improve surgical outcomes. Multidisciplinary management is required. -Most likely will proceed with combination of gemcitabine and Abraxane chemotherapy, to be initiated in the outpatient setting. - Please consult IR or surgery department for Port-A-Cath placement, preferably while he is in the hospital,  since anticoagulation management would be difficult in the outpatient setting - We will review his case in multidisciplinary tumor conference for management planning. - He will be referred to surgical oncology for evaluation of surgical candidacy. - Planned to initiate chemotherapy during the week of January 6-7, pending approvals and port placement. - Scheduled follow-up imaging (CT/MRI with contrast if renal function allows) to further define tumor extent. - Will arrange chemotherapy education session prior to initiation. - Will monitor for chemotherapy side effects and provide supportive medications as needed. - Will order repeat imaging 2-3 months after starting chemotherapy to reassess surgical resectability.  Indeterminate lung nodules Two small lung nodules (5 mm and 8 mm) identified on chest CT. Differential includes metastatic pancreatic cancer, smoking-related changes, infection, or inflammation. Further evaluation is required to determine metastatic disease, which would upstage to stage IV and preclude surgical management. - Ordered PET scan to assess metabolic activity of lung nodules and evaluate for metastatic disease.  This will be done outpatient. - Will review PET scan results in multidisciplinary conference to guide staging and management.  Pulmonary embolism Small pulmonary embolism identified during recent hospitalization, likely provoked by pancreatic cancer. Anticoagulation is required to reduce risk of further thromboembolic events. Coordination of anticoagulation timing is necessary for planned procedures. - Continued heparin  for anticoagulation while inpatient. - Please coordinate timing of anticoagulation hold for port-a-cath placement with procedural team. - Will transition to appropriate outpatient anticoagulation regimen after discharge.  Chronic kidney disease with acute kidney injury Chronic kidney disease with acute decline in renal function, possibly exacerbated  by recent contrast exposure and underlying illness. Nephrology is involved. Kidney biopsy deferred due to low likelihood of changing management and patient apprehension. Avoidance of further nephrotoxic insults is critical to prevent progression to dialysis, which would complicate cancer management. - Continued supportive care with hydration and avoidance of nephrotoxic  agents. - Will monitor renal function closely with serial labs. - Will avoid contrast imaging unless renal function improves. - Deferred kidney biopsy unless there is a rapid or unexplained further decline in renal function.  Paroxysmal atrial fibrillation Paroxysmal atrial fibrillation developed during hospitalization, likely related to acute illness and underlying cardiac comorbidities. Cardiac status must be optimized prior to surgical or procedural interventions. - Continued inpatient cardiac monitoring and management for atrial fibrillation. -We need to ensure cardiac status is optimized prior to surgical or procedural interventions.  Obstructive jaundice (resolved) Obstructive jaundice secondary to pancreatic cancer, previously with elevated bilirubin and liver enzymes. After intervention, liver function tests have normalized and jaundice has resolved. - Please monitor liver function tests (CMP) to ensure continued resolution.  Will continue to follow patient peripherally.  Please call us  with any questions.  Thank you, Chinita Patten, MD   [facility] CONSULT NOTE  Patient Care Team: McDiarmid, Krystal BIRCH, MD as PCP - General (Family Medicine) Pietro Redell RAMAN, MD as PCP - Cardiology (Cardiology) Darron Deatrice LABOR, MD as PCP - [facility] Cardiology (Cardiology) Charmayne Molly, MD as Consulting Physician (Ophthalmology) Ruthellen Ruthellen Radiology, MD as Rounding Team (Interventional Radiology)  CHIEF COMPLAINTS/PURPOSE OF CONSULTATION:  Newly diagnosed pancreatic adenocarcinoma   REFERRING PHYSICIAN: Dr.  Donzetta  HISTORY OF PRESENTING ILLNESS:  Colin Keller 68 y.o. male who presented on 11/19/2024 with complaints of abdominal pain.  Workup was done including ERCP with stent and percutaneous cholecystostomy tube.  Subsequent biopsies showed pancreatic cancer.  Therefore oncology evaluation has been requested. Patient is seen today laying in bed awake and alert.  He is a soft-spoken gentleman who appears in mild distress.  Patient reports ongoing abdominal pain and discomfort that has not been relieved by current management.  Noted with ongoing amiodarone  drip.  Patient relates chief complaints as listed above, that his abdominal pain had been ongoing for about 3 weeks prior to presenting to the hospital.  Right-sided percutaneous tube draining dark greenish fluid.  Denies chest pain or shortness of breath.  Denies bleeding. Medical history includes CHF/CAD, heart failure, A-fib, hypertension, PE, AKI, and type 2 diabetes. Surgical history includes biliary stents placement, right-sided. Family oncologic history is denied. Social history significant for tobacco use.  Admits to being a current smoker although he has not smoked in 3 weeks, smokes 1 pack/day.  Admits to occasional beer.  Denies recreational or illicit drug use.  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   ASSESSMENT & PLAN:  Pancreatic adenocarcinoma Lymphadenopathy Bile duct stricture - Newly diagnosed 11/28/2024 - Cytology pancreas cyst FNA showed adenocarcinoma - Tumor markers CEA, CA 19-9 pending - MRA 11/19/2024 showed common bile duct stricture - Status post ERCP with stent placement on 11/22/2024. - Patient has right-sided percutaneous cholecystostomy tube intact and draining dark greenish fluid. - Discussed with patient today and he wants to proceed with any diagnostic testing and oncologic therapy that is recommended. - Medical oncology/Dr. Amrutha Avera following and will make further evaluation and treatment  recommendations  Abdominal pain Cholecystitis Fatigue/generalized weakness - Likely due to malignancy - Status post percutaneous cholecystostomy tube on 11/24/2024 - Continue pain meds, Carafate  and PPI as ordered - Continue supportive care  Acute PE - CTA 11/15/2024 shows small PE in LLL  - On IV heparin , continue per protocol. - Transition to DOAC when appropriate - Monitor for bleeding  Anemia - Hemoglobin 8.0 - Likely multifactorial - Recommend PRBC transfusion for hemoglobin <7.0 -  Continue to monitor CBC with differential  AKI on CKD - Noted plans for renal biopsy and that has been postponed for now. - Avoid nephrotoxic agents - Nephrology following   MEDICAL HISTORY:  Past Medical History:  Diagnosis Date   Acute diastolic heart failure (HCC) 08/18/2018   AKI (acute kidney injury) 10/04/2023   Atherosclerosis of both carotid arteries 08/18/2020   Carotid dopplers 08/2019 40-59% bilateral stenosis and right subclavian stenosis   Bilateral carotid artery stenosis 08/18/2020   Right Carotid: Velocities in the right ICA are consistent with a 60-79%                 stenosis. Non-hemodynamically significant plaque <50% noted in                 the CCA. The ECA appears >50% stenosed. Proximal subclavian                 artery dilatation with elevated and turbulent flow just past the                 narrowing, measuring 1.6 cm.   Left Carotid: Velocities in the left ICA are    CHF (congestive heart failure) (HCC)    Chronic congestive heart failure (HCC) 10/04/2023   Transthoracic echocardiogram 10/08/2018: LVEF is approximately 50% with hypokinesis of the lateral (base/mid) and basal inferior walls T     Chronic left shoulder pain 03/18/2021   Chronic low back pain without sciatica 10/16/2008   H/o chronic LBP with h/o lumbar disc herniation and intermittent radicular pain  Chronic pain, on stable doses of Norco 10/325 TID. MRI in 2003 showing SMALL CENTRAL DISC HERNIATION AT  L4-5.  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      CKD (chronic kidney disease), stage III (HCC)    Coronary artery disease    Coronary artery disease involving native heart with angina pectoris, unspecified vessel or lesion type 08/10/2023   COVID 10/04/2023   Diabetes mellitus    DKA (diabetic ketoacidosis) (HCC) 10/03/2023   Dyspnea    Fall 10/04/2023   GERD (gastroesophageal reflux disease)    Hx of CABG 08/23/2018   LIMA to LAD, RIMA to RCA, SVG to OM3, EVH via right thigh   Hyperlipidemia    Hypertension    Hypertension associated with diabetes (HCC) 12/22/2008   Qualifier: Diagnosis of  By: Edrick MD, Susan     Hypertensive nephropathy 08/18/2020   Insulin  dependent type 2 diabetes mellitus (HCC) 12/22/2020   Left ventricular dysfunction 08/18/2020   TTE ([id number]): LVEF is approximately 50% with hypokinesis of the lateral (base/mid) and basal inferior walls The cavity size was normal.      Long term (current) use of antithrombotics/antiplatelets 09/21/2022   Planned 57-months DAPT for DES for PAD on 09/21/2022.  Planned 6 months therapy. End 03/23/23.   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   LIMA to LAD, RIMA to RCA, SVG to OM3, EVH via right thigh   Sleeping difficulties 09/08/2019   Smoking greater than 20 pack years 08/22/2024   Subclavian artery stenosis, right 08/18/2020   Carotid dopplers 08/2019 40-59% bilateral stenosis and right subclavian stenosis   Tobacco abuse    Tobacco abuse disorder     Qualifier: Diagnosis of  By: Edrick MD, Devere      SURGICAL HISTORY: Past Surgical History:  Procedure Laterality Date   ABDOMINAL AORTOGRAM W/LOWER EXTREMITY N/A 09/21/2022   Procedure: ABDOMINAL AORTOGRAM W/LOWER EXTREMITY;  Surgeon: Darron Deatrice LABOR, MD;  Location: [facility] INVASIVE CV LAB;  Service: Cardiovascular;  Laterality: N/A;   BILIARY STENT PLACEMENT  11/22/2024   Procedure: INSERTION, STENT, BILE DUCT;  Surgeon: Abran Norleen SAILOR, MD;  Location: [facility] ENDOSCOPY;  Service: Gastroenterology;;   CORONARY ARTERY BYPASS GRAFT N/A 08/23/2018   Procedure: CORONARY ARTERY BYPASS GRAFTING (CABG) x 3; -Left Internal Mammary Artery to Left Anterior Descending Artery, -Right Internal Mammary Artery to Right Coronary Artery, -Saphenous Vein Graft to Obtuse Marginal;  ENDOSCOPIC HARVEST GREATER SAPHENOUS VEIN  -Right Thigh;  Surgeon: Dusty Sudie DEL, MD;  Location: [facility] OR;  Service: Open Heart Surgery;  Laterality: N/A;   CORONARY PRESSURE/FFR STUDY N/A 08/20/2018   Procedure: INTRAVASCULAR PRESSURE WIRE/FFR STUDY;  Surgeon: Wonda Sharper, MD;  Location: [facility] INVASIVE CV LAB;  Service: Cardiovascular;  Laterality: N/A;   ERCP N/A 11/22/2024   Procedure: ERCP, WITH INTERVENTION IF INDICATED;  Surgeon: Abran Norleen SAILOR, MD;  Location: [facility] ENDOSCOPY;  Service: Gastroenterology;  Laterality: N/A;   ESOPHAGOGASTRODUODENOSCOPY N/A 11/26/2024   Procedure: EGD (ESOPHAGOGASTRODUODENOSCOPY);  Surgeon: Wilhelmenia Aloha Raddle., MD;  Location: [facility] ENDOSCOPY;  Service: Gastroenterology;  Laterality: N/A;   ESOPHAGOGASTRODUODENOSCOPY N/A 11/28/2024   Procedure: EGD (ESOPHAGOGASTRODUODENOSCOPY);  Surgeon: Wilhelmenia Aloha Raddle., MD;  Location: [facility] ENDOSCOPY;  Service: Gastroenterology;  Laterality: N/A;   EUS N/A 11/28/2024   Procedure: ULTRASOUND, UPPER GI TRACT, ENDOSCOPIC;  Surgeon: Wilhelmenia Aloha Raddle., MD;  Location: [facility] ENDOSCOPY;  Service: Gastroenterology;  Laterality: N/A;  with possible FNA   FINE NEEDLE  ASPIRATION  11/28/2024   Procedure: FINE NEEDLE ASPIRATION;  Surgeon: Wilhelmenia Aloha Raddle., MD;  Location: [facility] ENDOSCOPY;  Service: Gastroenterology;;   FOREIGN BODY REMOVAL  11/28/2024   Procedure: REMOVAL, FOREIGN BODY;  Surgeon: Wilhelmenia Aloha Raddle., MD;  Location: [facility] ENDOSCOPY;  Service: Gastroenterology;;  food in stomach   HEMOSTASIS CLIP PLACEMENT  11/26/2024   Procedure: CONTROL OF HEMORRHAGE, GI TRACT, ENDOSCOPIC, BY CLIPPING OR OVERSEWING;  Surgeon: Wilhelmenia Aloha Raddle., MD;  Location: [facility] ENDOSCOPY;  Service: Gastroenterology;;   IR PERC CHOLECYSTOSTOMY  11/24/2024   LEFT HEART CATH AND CORONARY ANGIOGRAPHY N/A 08/20/2018   Procedure: LEFT HEART CATH AND CORONARY ANGIOGRAPHY;  Surgeon: Wonda Sharper, MD;  Location: [facility] INVASIVE CV LAB;  Service: Cardiovascular;  Laterality: N/A;   PERIPHERAL VASCULAR ATHERECTOMY Left 09/21/2022   Procedure: PERIPHERAL VASCULAR ATHERECTOMY;  Surgeon: Darron Deatrice LABOR, MD;  Location: [facility] INVASIVE CV LAB;  Service: Cardiovascular;  Laterality: Left;  SFA   PERIPHERAL VASCULAR BALLOON ANGIOPLASTY Left 09/21/2022   Procedure: PERIPHERAL VASCULAR BALLOON ANGIOPLASTY;  Surgeon: Darron Deatrice LABOR, MD;  Location: [facility] INVASIVE CV LAB;  Service: Cardiovascular;  Laterality: Left;  SFA   SPHINCTEROTOMY  11/22/2024   Procedure: SPHINCTEROTOMY, BILIARY;  Surgeon: Abran Norleen SAILOR, MD;  Location: [facility] ENDOSCOPY;  Service: Gastroenterology;;   TEE WITHOUT CARDIOVERSION N/A 08/23/2018   Procedure:  TRANSESOPHAGEAL ECHOCARDIOGRAM (TEE);  Surgeon: Dusty Sudie DEL, MD;  Location: [facility] OR;  Service: Open Heart Surgery;  Laterality: N/A;    SOCIAL HISTORY: Social History   Socioeconomic History   Marital status: Married    Spouse name: Katheryn    Number of children: 2   Years of education: 10   Highest education level: 10th grade  Occupational History   Occupation: music therapist  Tobacco Use   Smoking status: Every Day    Current packs/day: 0.00    Average packs/day:  1 pack/day for 48.0 years (48.0 ttl pk-yrs)    Types: Cigarettes    Start date: 08/12/1970    Last attempt to quit: 08/12/2018    Years since quitting: 6.3   Smokeless tobacco: Never   Tobacco comments:    Quit post CABG in 2019 - previous 1ppd  Vaping Use   Vaping status: Never Used  Substance and Sexual Activity   Alcohol  use: No    Alcohol /week: 0.0 standard drinks of alcohol    Drug use: No   Sexual activity: Yes    Birth control/protection: Post-menopausal  Other Topics Concern   Not on file  Social History Narrative   Lives with his wife in [place].    Wife Katheryn is also [facility] patient.   Works odd-jobs in holiday representative.    Previously used marijuana -quit 10/03. Previous 1ppd smoker- quit 2019.   Surrogate decision maker: Kanton Kamel (wife)   Patient has one daughter.   Patients son died of a drug overdose 2021-03-30.   Patient has 2 dogs and 2 cats.       Social Drivers of Health   Tobacco Use: High Risk (11/28/2024)   Patient History    Smoking Tobacco Use: Every Day    Smokeless Tobacco Use: Never    Passive Exposure: Not on file  Financial Resource Strain: Low Risk (08/07/2023)   Overall Financial Resource Strain (CARDIA)    Difficulty of Paying Living Expenses: Not hard at all  Food Insecurity: No Food Insecurity (11/19/2024)   Epic    Worried About Programme Researcher, Broadcasting/film/video in the Last Year: Never true    Ran Out of Food in the Last Year: Never true  Transportation Needs: No Transportation Needs (11/19/2024)   Epic    Lack of Transportation (Medical): No    Lack of Transportation (Non-Medical): No  Physical Activity: Sufficiently Active (08/07/2023)   Exercise Vital Sign    Days of Exercise per Week: 5 days    Minutes of Exercise per Session: 30 min  Stress: No Stress Concern Present (08/07/2023)   [employer] of Occupational Health - Occupational Stress Questionnaire    Feeling of Stress : Not at all  Social Connections: Moderately Isolated (11/19/2024)   Social  Connection and Isolation Panel    Frequency of Communication with Friends and Family: More than three times a week    Frequency of Social Gatherings with Friends and Family: Three times a week    Attends Religious Services: Never    Active Member of Clubs or Organizations: No    Attends Banker Meetings: Never    Marital Status: Married  Catering Manager Violence: Not At Risk (11/19/2024)   Epic    Fear of Current or Ex-Partner: No    Emotionally Abused: No    Physically Abused: No    Sexually Abused: No  Depression (PHQ2-9): Low Risk (08/22/2024)   Depression (PHQ2-9)    PHQ-2 Score: 0  Alcohol  Screen: Low Risk (  08/07/2023)   Alcohol  Screen    Last Alcohol  Screening Score (AUDIT): 0  Housing: Low Risk (11/19/2024)   Epic    Unable to Pay for Housing in the Last Year: No    Number of Times Moved in the Last Year: 0    Homeless in the Last Year: No  Utilities: Not At Risk (11/19/2024)   Epic    Threatened with loss of utilities: No  Health Literacy: Adequate Health Literacy (08/07/2023)   B1300 Health Literacy    Frequency of need for help with medical instructions: Rarely    FAMILY HISTORY: Family History  Problem Relation Age of Onset   Heart disease Mother    Diabetes Mother    Heart failure Mother    Heart disease Father    Sudden death Brother    Drug abuse Son      PHYSICAL EXAMINATION: ECOG PERFORMANCE STATUS: 3 - Symptomatic, >50% confined to bed  Vitals:   12/03/24 0744 12/03/24 1141  BP: 136/80 139/81  Pulse: (!) 108 (!) 111  Resp: 16 16  Temp: 97.6 F (36.4 C) 97.7 F (36.5 C)  SpO2: 96% 97%   Filed Weights   12/01/24 0351 12/02/24 0231 12/03/24 0500  Weight: 151 lb 10.8 oz (68.8 kg) 151 lb 0.2 oz (68.5 kg) 143 lb 8.3 oz (65.1 kg)    GENERAL: alert, no distress and comfortable +chronically ill-appearing SKIN: + Slightly jaundiced skin color, texture, turgor are normal, no rashes or significant lesions EYES: normal, conjunctiva are pink  and non-injected, sclera clear OROPHARYNX: no exudate, no erythema and lips, buccal mucosa, and tongue normal  NECK: supple, thyroid normal size, non-tender, without nodularity LYMPH: no palpable lymphadenopathy in the cervical, axillary or inguinal LUNGS: clear to auscultation and percussion with normal breathing effort HEART: regular rate & rhythm and no murmurs and no lower extremity edema ABDOMEN: abdomen soft, non-tender and normal bowel sounds MUSCULOSKELETAL: no cyanosis of digits and no clubbing  PSYCH: alert & oriented x 3 with fluent speech NEURO: no focal motor/sensory deficits   ALLERGIES:  is allergic to amlodipine , empagliflozin , amitriptyline , lisinopril , nortriptyline  hcl, statins, and simvastatin .  MEDICATIONS:  Current Facility-Administered Medications  Medication Dose Route Frequency Provider Last Rate Last Admin   0.9 %  sodium chloride  infusion (Manually program via Guardrails IV Fluids)   Intravenous Once Shitarev, Dimitry, MD       acetaminophen  (TYLENOL ) tablet 650 mg  650 mg Oral QID Nicholas Bar, MD   650 mg at 12/03/24 1106   alum & mag hydroxide-simeth (MAALOX/MYLANTA) 200-200-20 MG/5ML suspension 15 mL  15 mL Oral Q6H PRN Abran Norleen SAILOR, MD   15 mL at 11/29/24 1704   amiodarone  (NEXTERONE  PREMIX) 360-4.14 MG/200ML-% (1.8 mg/mL) IV infusion  30 mg/hr Intravenous Continuous Madie Jon Garre, PA 16.67 mL/hr at 12/03/24 1007 30 mg/hr at 12/03/24 1007   artificial tears ophthalmic solution 1 drop  1 drop Both Eyes PRN Nicholas Bar, MD   1 drop at 11/29/24 2349   carvedilol  (COREG ) tablet 6.25 mg  6.25 mg Oral BID WC Madie Jon Garre, PA   6.25 mg at 12/03/24 1107   Chlorhexidine  Gluconate Cloth 2 % PADS 6 each  6 each Topical Daily Autry, Lauren E, PA-C   6 each at 12/03/24 1103   feeding supplement (BOOST / RESOURCE BREEZE) liquid 1 Container  1 Container Oral TID BM Alghanim, Fahid, MD   1 Container at 12/02/24 1510   heparin  ADULT infusion 100 units/mL  (25000 units/250mL)  1,200 Units/hr Intravenous Continuous Hammons, Suzen NOVAK, RPH 12 mL/hr at 12/03/24 1240 1,200 Units/hr at 12/03/24 1240   hydrALAZINE  (APRESOLINE ) tablet 50 mg  50 mg Oral Q8H Madie Jon Garre, PA   50 mg at 12/03/24 1357   insulin  aspart (novoLOG ) injection 0-9 Units  0-9 Units Subcutaneous TID WC Baloch, Mahnoor, MD   5 Units at 12/03/24 1242   insulin  glargine (LANTUS ) injection 10 Units  10 Units Subcutaneous Daily Janna Ferrier, DO   10 Units at 12/03/24 1241   isosorbide  mononitrate (IMDUR ) 24 hr tablet 60 mg  60 mg Oral Daily Harold Scholz, MD   60 mg at 12/03/24 1107   lactulose  (CHRONULAC ) 10 GM/15ML solution 10 g  10 g Oral BID Manon Jester, DO       lidocaine  (LIDODERM ) 5 % 3 patch  3 patch Transdermal Q24H Abran Norleen SAILOR, MD   3 patch at 12/01/24 2238   melatonin tablet 5 mg  5 mg Oral QHS PRN Abran Norleen SAILOR, MD   5 mg at 12/01/24 0102   multivitamin with minerals tablet 1 tablet  1 tablet Oral Daily Alena Morrison, Reagan, MD   1 tablet at 12/03/24 1107   oxyCODONE  (Oxy IR/ROXICODONE ) immediate release tablet 5 mg  5 mg Oral Q6H PRN Lorrane Pac, MD   5 mg at 12/03/24 1251   pantoprazole  (PROTONIX ) injection 40 mg  40 mg Intravenous Q12H Nygaard, Joseph, MD   40 mg at 12/03/24 1108   polyethylene glycol (MIRALAX  / GLYCOLAX ) packet 17 g  17 g Oral BID Alena Morrison, Reagan, MD   17 g at 12/02/24 9058   prochlorperazine  (COMPAZINE ) tablet 10 mg  10 mg Oral Q6H PRN Baloch, Mahnoor, MD       senna (SENOKOT) tablet 8.6 mg  1 tablet Oral BID Alena Morrison, Reagan, MD   8.6 mg at 12/02/24 0941   sodium chloride  flush (NS) 0.9 % injection 5 mL  5 mL Intracatheter Q8H Suttle, Dylan J, MD   5 mL at 12/03/24 1346   sucralfate  (CARAFATE ) tablet 1 g  1 g Oral BID Alena Morrison, Reagan, MD   1 g at 12/03/24 1242   tamsulosin  (FLOMAX ) capsule 0.4 mg  0.4 mg Oral Daily Alghanim, Fahid, MD   0.4 mg at 12/03/24 1107   tetracaine  (PONTOCAINE) 0.5 % ophthalmic  solution 1 drop  1 drop Left Eye BID PRN Alena Morrison, Reagan, MD         LABORATORY DATA:  I have reviewed the data as listed Lab Results  Component Value Date   WBC 16.6 (H) 12/03/2024   HGB 8.0 (L) 12/03/2024   HCT 24.3 (L) 12/03/2024   MCV 95.3 12/03/2024   PLT 387 12/03/2024   Recent Labs    11/24/24 0822 11/25/24 0414 11/29/24 0805 11/30/24 0938 12/01/24 0343 12/02/24 0346 12/03/24 0437  NA  --    < > 136 142 139 138 138  K  --    < > 4.5 5.0 4.3 4.5 4.3  CL  --    < > 103 101 98 98 100  CO2  --    < > 22 32 27 21* 25  GLUCOSE  --    < > 141* 171* 332* 384* 244*  BUN  --    < > 53* 52* 61* 73* 75*  CREATININE  --    < > 2.77* 2.92* 3.17* 3.48* 3.45*  CALCIUM   --    < > 8.8* 9.2 8.8* 8.6*  8.5*  GFRNONAA  --    < > 24* 22* 20* 18* 18*  PROT 6.0*   < > 5.8* 6.0* 5.9*  --   --   ALBUMIN  1.9*   < > 2.7* 2.8* 2.8*  --   --   AST 347*   < > 18 20 19   --   --   ALT 211*   < > 74* 65* 52*  --   --   ALKPHOS 1,158*   < > 704* 678* 620*  --   --   BILITOT 3.2*   < > 0.8 0.8 0.8  --   --   BILIDIR 1.8*  --  0.6*  --   --   --   --   IBILI 1.4*  --  0.2*  --   --   --   --    < > = values in this interval not displayed.    RADIOGRAPHIC STUDIES: I have personally reviewed the radiological images as listed and agreed with the findings in the report. DG CHEST PORT 1 VIEW Result Date: 11/30/2024 CLINICAL DATA:  Shortness of breath. EXAM: PORTABLE CHEST 1 VIEW COMPARISON:  Chest [place]  dated 11/15/2024. FINDINGS: No focal consolidation, pleural effusion or pneumothorax. Mild central vascular congestion. The cardiac silhouette. Median sternotomy wires. No acute osseous pathology. IMPRESSION: Mild central vascular congestion. No focal consolidation. Electronically Signed   By: Vanetta Chou M.D.   On: 11/30/2024 12:27   DG Abd 1 View Result Date: 11/30/2024 EXAM: 1 VIEW XRAY OF THE ABDOMEN 11/30/2024 04:08:00 AM COMPARISON: CT of the abdomen and pelvis dated  11/19/2024. CLINICAL HISTORY: [id number] Vomiting [id number] Vomiting FINDINGS: LINES, TUBES AND DEVICES: Biliary stent has been placed. Pigtail catheter is looped just beneath the liver. BOWEL: There is moderate foreign fecal material within the colon. Nonobstructive bowel gas pattern. SOFT TISSUES: Moderate vascular calcifications. BONES: Mild levoscoliosis of the lumbar spine and multilevel chronic degenerative disc disease. Status post sternotomy. No acute fracture. IMPRESSION: 1. Biliary stent and right upper quadrant pigtail catheter in place. 2. Moderate colonic stool burden without evidence of bowel obstruction. 3. No acute findings. Electronically signed by: Evalene Coho MD 11/30/2024 04:30 AM EST RP Workstation: [id number]   VAS US  LOWER EXTREMITY VENOUS (DVT) Result Date: 11/28/2024  Lower Venous DVT Study Patient Name:  Colin Keller  Date of Exam:   11/26/2024 Medical Rec #: [id number]      Accession #:    [id number] Date of Birth: Jul 06, 1956      Patient Gender: M Patient Age:   80 years Exam Location:  [facility] Procedure:      VAS US  LOWER EXTREMITY VENOUS (DVT) Referring Phys: CARINA BROWN --------------------------------------------------------------------------------  Indications: Pulmonary embolism.  Comparison Study: No prior exam. Performing Technologist: Edilia Elden Appl  Examination Guidelines: A complete evaluation includes B-mode imaging, spectral Doppler, color Doppler, and power Doppler as needed of all accessible portions of each vessel. Bilateral testing is considered an integral part of a complete examination. Limited examinations for reoccurring indications may be performed as noted. The reflux portion of the exam is performed with the patient in reverse Trendelenburg.  +---------+---------------+---------+-----------+----------+--------------+ RIGHT    CompressibilityPhasicitySpontaneityPropertiesThrombus Aging  +---------+---------------+---------+-----------+----------+--------------+ CFV      Full           No       Yes                                 +---------+---------------+---------+-----------+----------+--------------+  SFJ      Full           Yes      Yes                                 +---------+---------------+---------+-----------+----------+--------------+ FV Prox  Full                                                        +---------+---------------+---------+-----------+----------+--------------+ FV Mid   Full                                                        +---------+---------------+---------+-----------+----------+--------------+ FV DistalFull                                                        +---------+---------------+---------+-----------+----------+--------------+ PFV      Full                                                        +---------+---------------+---------+-----------+----------+--------------+ POP      Full           Yes      Yes                                 +---------+---------------+---------+-----------+----------+--------------+ PTV      Full                                                        +---------+---------------+---------+-----------+----------+--------------+ PERO     Full                                                        +---------+---------------+---------+-----------+----------+--------------+ Plaque noted in adjacent arteries.  +---------+---------------+---------+-----------+----------+--------------+ LEFT     CompressibilityPhasicitySpontaneityPropertiesThrombus Aging +---------+---------------+---------+-----------+----------+--------------+ CFV      Full           Yes      Yes                                 +---------+---------------+---------+-----------+----------+--------------+ SFJ      Full           Yes      Yes                                  +---------+---------------+---------+-----------+----------+--------------+  FV Prox  Full                                                        +---------+---------------+---------+-----------+----------+--------------+ FV Mid   Full                                                        +---------+---------------+---------+-----------+----------+--------------+ FV DistalFull                                                        +---------+---------------+---------+-----------+----------+--------------+ PFV      Full                                                        +---------+---------------+---------+-----------+----------+--------------+ POP      Full           Yes      Yes                                 +---------+---------------+---------+-----------+----------+--------------+ PTV      Full                                                        +---------+---------------+---------+-----------+----------+--------------+ PERO     Full                                                        +---------+---------------+---------+-----------+----------+--------------+     Summary: BILATERAL: - No evidence of deep vein thrombosis seen in the lower extremities, bilaterally. -No evidence of popliteal cyst, bilaterally.   *See table(s) above for measurements and observations. Electronically signed by Fonda Rim on 11/28/2024 at 7:49:10 AM.    Final    ECHOCARDIOGRAM LIMITED Result Date: 11/27/2024    ECHOCARDIOGRAM LIMITED REPORT   Patient Name:   SHERILL WEGENER Date of Exam: 11/27/2024 Medical Rec #:  [id number]     Height:       70.0 in Accession #:    [id number]    Weight:       148.8 lb Date of Birth:  November 09, 1956     BSA:          1.841 m Patient Age:    68 years      BP:           149/101 mmHg Patient Gender: M             HR:  130 bpm. Exam Location:  Inpatient Procedure: Limited Echo, Cardiac Doppler and Color Doppler (Both Spectral and             Color Flow Doppler were utilized during procedure). Indications:    Afib with RVR  History:        Patient has prior history of Echocardiogram examinations, most                 recent 11/23/2024. PAD; Risk Factors:Dyslipidemia, Diabetes and                 Hypertension.  Sonographer:    Carmelita Hartshorn RDCS, FE, PE Referring Phys: [phone] CARINA M BROWN IMPRESSIONS  1. Left ventricular ejection fraction, by estimation, is 40 to 45%. The left ventricle has mildly decreased function. The left ventricle demonstrates global hypokinesis. There is mild concentric left ventricular hypertrophy. Left ventricular diastolic function could not be evaluated.  2. Right ventricular systolic function is moderately reduced.  3. Left atrial size was moderately dilated.  4. The mitral valve is degenerative. Trivial mitral valve regurgitation. Severe mitral annular calcification.  5. The aortic valve is tricuspid. There is mild calcification of the aortic valve. Aortic valve sclerosis/calcification is present, without any evidence of aortic stenosis. Conclusion(s)/Recommendation(s): EF hard to assess in setting of rapid AF. FINDINGS  Left Ventricle: Left ventricular ejection fraction, by estimation, is 40 to 45%. The left ventricle has mildly decreased function. The left ventricle demonstrates global hypokinesis. There is mild concentric left ventricular hypertrophy. Left ventricular diastolic function could not be evaluated. Left ventricular diastolic function could not be evaluated due to atrial fibrillation. Right Ventricle: Right ventricular systolic function is moderately reduced. Left Atrium: Left atrial size was moderately dilated. Mitral Valve: The mitral valve is degenerative in appearance. Severe mitral annular calcification. Trivial mitral valve regurgitation. Tricuspid Valve: Tricuspid valve regurgitation is trivial. Aortic Valve: The aortic valve is tricuspid. There is mild calcification of the aortic valve. Aortic valve  sclerosis/calcification is present, without any evidence of aortic stenosis. Pulmonic Valve: The pulmonic valve was not well visualized. Venous: The inferior vena cava was not well visualized. LEFT VENTRICLE PLAX 2D LVIDd:         4.00 cm LVIDs:         3.30 cm LV PW:         1.30 cm LV IVS:        1.30 cm LVOT diam:     2.30 cm LVOT Area:     4.15 cm  LEFT ATRIUM         Index LA diam:    4.30 cm 2.34 cm/m   AORTA Ao Root diam: 3.60 cm  SHUNTS Systemic Diam: 2.30 cm Toribio Fuel MD Electronically signed by Toribio Fuel MD Signature Date/Time: 11/27/2024/1:22:06 PM    Final    DG ERCP Result Date: 11/27/2024 CLINICAL DATA:  Bile duct obstruction. EXAM: ERCP 50 right upper quadrant fluoroscopic images submitted for interpretation TECHNIQUE: Multiple spot images obtained with the fluoroscopic device and submitted for interpretation post-procedure. FLUOROSCOPY: Radiation Exposure Index (as provided by the fluoroscopic device): 242.43 mGy Kerma COMPARISON:  None Available. FINDINGS: Right upper quadrant images demonstrate a flexible endoscopy device with guidewires advancing into the pancreatic duct and common bile duct. Distal and mid common bile duct narrowing with proximal dilatation of the common bile duct and intrahepatic ducts. The common bile duct is mildly tortuous. Small filling defects identified near the ampulla on multiple initial images. Intra procedural placement of plastic  common bile duct stent. IMPRESSION: Focal mid and distal stenosis of the common bile duct and interval placement of a plastic biliary stent. These images were submitted for radiologic interpretation only. Please see the procedural report for the amount of contrast and the fluoroscopy time utilized. Electronically Signed   By: Cordella Banner   On: 11/27/2024 09:03   US  RENAL Result Date: 11/25/2024 EXAM: US  Retroperitoneum Complete, Renal. 11/25/2024 03:04:13 PM TECHNIQUE: Real-time ultrasonography of the  retroperitoneum renal was performed. COMPARISON: US  Renal 01/22/2020. CLINICAL HISTORY: Acute renal failure superimposed on stage 3 chronic kidney disease, unspecified acute renal failure type, unspecified whether stage 3a or 3b CKD. FINDINGS: LIMITATIONS: There is limited visualization of the kidney secondary to bandage. RIGHT KIDNEY/URETER: Right kidney measures 9.2 x 4.9 x 4.8 cm. Normal cortical echogenicity. No hydronephrosis. No calculus. No mass. LEFT KIDNEY/URETER: Left kidney measures 9.5 x 5.9 x 5.1 cm. Normal cortical echogenicity. No hydronephrosis. No calculus. No mass. BLADDER: Foley catheter decompresses the bladder. IMPRESSION: 1. Limited visualization of the kidneys due to overlying bandage. No hydronephrosis. Electronically signed by: Greig Pique MD 11/25/2024 08:01 PM EST RP Workstation: [id number]   IR Perc Cholecystostomy Result Date: 11/25/2024 INDICATION: 68 year old male with concern for acute cholecystitis. EXAM: Fluoroscopic and ultrasound-guided percutaneous cholecystostomy tube placement MEDICATIONS: The patient was currently receiving intravenous antibiotics as an inpatient, no additional antibiotics were administered. ANESTHESIA/SEDATION: Moderate (conscious) sedation was employed during this procedure. A total of Versed  2 mg and Fentanyl  100 mcg was administered intravenously. Moderate Sedation Time: 6 minutes. The patient's level of consciousness and vital signs were monitored continuously by radiology nursing throughout the procedure under my direct supervision. FLUOROSCOPY TIME:  One mGy reference air kerma COMPLICATIONS: None immediate. PROCEDURE: Informed written consent was obtained from the patient after a thorough discussion of the procedural risks, benefits and alternatives. All questions were addressed. Maximal Sterile Barrier Technique was utilized including caps, mask, sterile gowns, sterile gloves, sterile drape, hand hygiene and skin antiseptic. A timeout was  performed prior to the initiation of the procedure. The patient was placed supine on the angiographic table. The patient's right upper quadrant was then prepped and draped in normal sterile fashion with maximum sterile barrier. Ultrasound demonstrates a distended gallbladder. Subdermal Local anesthesia was provided at the planned skin entry site. Under ultrasound guidance, deeper local anesthetic was provided through intercostal muscles and along the liver capsule. Ultrasound was used to puncture the gallbladder using a 18 gauge trocar needle via a subcostal, transabdominal approach. A 0.035 inch exchange wire was placed in the tract was dilated. A 10 French multipurpose drainage catheter was advanced into the gallbladder lumen. Approximately 100 cc of bilious aspirate was a vacuo aided. Contrast injection demonstrated nondistended gallbladder without visualization of the cystic duct. The drain was then secured in place using a 0-silk suture and a Stayfix device. A sterile dressing was applied. The tube was placed to bulb suction. A culture was sent to the lab for analysis. The patient tolerated procedure well without evidence of immediate complication was transferred back to the floor in stable condition. IMPRESSION: Successful placement of percutaneous, transabdominal 10 French cholecystostomy tube. Ester Sides, MD [facility] Electronically Signed   By: Ester Sides M.D.   On: 11/25/2024 07:27   ECHOCARDIOGRAM LIMITED Result Date: 11/23/2024    ECHOCARDIOGRAM LIMITED REPORT   Patient Name:   Colin Keller Date of Exam: 11/23/2024 Medical Rec #:  [id number]     Height:  70.0 in Accession #:    [id number]    Weight:       148.8 lb Date of Birth:  [birth year]     BSA:          1.841 m Patient Age:    68 years      BP:           131/69 mmHg Patient Gender: M             HR:           59 bpm. Exam Location:  Inpatient Procedure: Limited Echo (Both  Spectral and Color Flow Doppler were utilized            during procedure). Indications:    R94.31 Abnormal EKG  History:        Patient has prior history of Echocardiogram examinations, most                 recent 11/16/2024. CAD.  Sonographer:    Nathanel Devonshire Referring Phys: [id number] TINNIE FORBES FURTH IMPRESSIONS  1. Limited echo with limited images  2. Left ventricular ejection fraction, by estimation, is 50 to 55%. Left ventricular ejection fraction by 2D MOD biplane is 52.0 %. The left ventricle has low normal function. The left ventricle demonstrates global hypokinesis. There is mild left ventricular hypertrophy. Left ventricular diastolic parameters are consistent with Grade III diastolic dysfunction (restrictive). Elevated left ventricular end-diastolic pressure.  3. The mitral valve is degenerative. Trivial mitral valve regurgitation.  4. The inferior vena cava is normal in size with <50% respiratory variability, suggesting right atrial pressure of 8 mmHg. Comparison(s): Changes from prior study are noted. 11/16/2024: LVEF 45-50%, grade III DD. FINDINGS  Left Ventricle: Left ventricular ejection fraction, by estimation, is 50 to 55%. Left ventricular ejection fraction by 2D MOD biplane is 52.0 %. The left ventricle has low normal function. The left ventricle demonstrates global hypokinesis. There is mild left ventricular hypertrophy. Left ventricular diastolic parameters are consistent with Grade III diastolic dysfunction (restrictive). Elevated left ventricular end-diastolic pressure. Mitral Valve: The mitral valve is degenerative in appearance. Mild to moderate mitral annular calcification. Trivial mitral valve regurgitation. Venous: The inferior vena cava is normal in size with less than 50% respiratory variability, suggesting right atrial pressure of 8 mmHg.  LV Volumes (MOD)               Biplane EF (MOD) LV vol d, MOD    131.0 ml      LV Biplane EF:   Left A2C:                                             ventricular LV vol d, MOD    145.0 ml                       ejection A4C:                                            fraction by LV vol s, MOD    60.3 ml                        2D MOD A2C:  biplane is LV vol s, MOD    73.1 ml                        52.0 %. A4C: LV SV MOD A2C:   70.7 ml       Diastology LV SV MOD A4C:   145.0 ml      LV e' medial:   3.48 cm/s LV SV MOD BP:    73.1 ml       LV E/e' medial: 34.8  MITRAL VALVE MV Area (PHT): 4.63 cm MV Decel Time: 164 msec MV E velocity: 121.00 cm/s MV A velocity: 51.90 cm/s MV E/A ratio:  2.33 Vinie Maxcy MD Electronically signed by Vinie Maxcy MD Signature Date/Time: 11/23/2024/12:26:13 PM    Final    DG C-Arm 1-60 Min-No Report Result Date: 11/22/2024 Fluoroscopy was utilized by the requesting physician.  No radiographic interpretation.   DG C-Arm 1-60 Min-No Report Result Date: 11/22/2024 Fluoroscopy was utilized by the requesting physician.  No radiographic interpretation.   NM Hepatobiliary Liver Func Result Date: 11/20/2024 EXAM: NM HEPATOBILLARY SCAN 11/20/2024 04:18:31 PM TECHNIQUE: RADIOPHARMACEUTICAL: 7.5 mCi Tc-3m mebrofenin  Dynamic images of the abdomen and pelvis were obtained in the anterior projection for 90 minutes after intravenous administration of radiopharmaceutical. COMPARISON: MRI 11/19/2024. CLINICAL HISTORY: Cholecystitis. FINDINGS: There is uniform uptake within the liver. No activity is present within the common bile duct over the entirety of the exam. The gallbladder cannot be assessed without filling of the common duct. Findings concerning for distal common bile duct obstruction in light of the prior day MRCP. IMPRESSION: 1. Findings concerning for distal common bile duct obstruction in light of the prior day MRCP. Electronically signed by: Norleen Boxer MD 11/20/2024 04:26 PM EST RP Workstation: [id number]   MR ABDOMEN MRCP WO CONTRAST Result Date: 11/19/2024 CLINICAL DATA:   Abdominal pain. Gallbladder wall thickening and biliary ductal dilatation on recent ultrasound. EXAM: MRI ABDOMEN WITHOUT CONTRAST  (INCLUDING MRCP) TECHNIQUE: Multiplanar multisequence MR imaging of the abdomen was performed. Heavily T2-weighted images of the biliary and pancreatic ducts were obtained, and three-dimensional MRCP images were rendered by post processing. COMPARISON:  None Available. FINDINGS: Lower chest: No acute findings. Hepatobiliary:  No masses visualized on this unenhanced exam. The gallbladder is mildly distended and shows mild diffuse wall thickening. No definite gallstones are seen. Pericholecystic edema is seen but is similar to diffuse mesenteric, retroperitoneal, and body wall edema seen throughout the abdomen. Diffuse biliary ductal dilatation is seen, with common bile duct measuring 14 mm in diameter. No evidence of choledocholithiasis. Stricture of the distal common bile duct is seen within the pancreatic head. Pancreas: Evaluation is limited by lack of intravenous contrast and some motion artifact. Multiple tiny cystic foci are seen within the pancreatic head at the site of distal common bile duct stricture. This may be due to chronic pancreatitis, however, pancreatic mass cannot definitely be excluded. No No evidence of main pancreatic ductal dilatation. Spleen:  Within normal limits in size. Adrenals/Urinary tract: Unremarkable. No evidence of nephrolithiasis or hydronephrosis. Stomach/Bowel: No dilated bowel loops. Diffuse mesenteric edema and minimal perihepatic ascites. Vascular/Lymphatic: No pathologically enlarged lymph nodes identified. No evidence of abdominal aortic aneurysm. Other:  None. Musculoskeletal:  No suspicious bone lesions identified. 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 No evidence of choledocholithiasis. Multiple tiny cystic foci within the pancreatic head at the site of distal common bile duct stricture. This  may be due  to chronic pancreatitis, however, pancreatic mass cannot definitely be excluded on this unenhanced exam. Recommend further imaging evaluation with pancreatic protocol abdomen CT without and with contrast Distended gallbladder with mild diffuse wall thickening, which is nonspecific. Differential diagnosis includes cholecystitis and other etiologies such as hypoalbuminemia, liver disease, or heart failure. Anasarca and minimal perihepatic ascites. Electronically Signed   By: Norleen DELENA Kil M.D.   On: 11/19/2024 17:48   MR 3D Recon At Scanner Result Date: 11/19/2024 CLINICAL DATA:  Abdominal pain. Gallbladder wall thickening and biliary ductal dilatation on recent ultrasound. EXAM: MRI ABDOMEN WITHOUT CONTRAST  (INCLUDING MRCP) TECHNIQUE: Multiplanar multisequence MR imaging of the abdomen was performed. Heavily T2-weighted images of the biliary and pancreatic ducts were obtained, and three-dimensional MRCP images were rendered by post processing. COMPARISON:  None Available. FINDINGS: Lower chest: No acute findings. Hepatobiliary:  No masses visualized on this unenhanced exam. The gallbladder is mildly distended and shows mild diffuse wall thickening. No definite gallstones are seen. Pericholecystic edema is seen but is similar to diffuse mesenteric, retroperitoneal, and body wall edema seen throughout the abdomen. Diffuse biliary ductal dilatation is seen, with common bile duct measuring 14 mm in diameter. No evidence of choledocholithiasis. Stricture of the distal common bile duct is seen within the pancreatic head. Pancreas: Evaluation is limited by lack of intravenous contrast and some motion artifact. Multiple tiny cystic foci are seen within the pancreatic head at the site of distal common bile duct stricture. This may be due to chronic pancreatitis, however, pancreatic mass cannot definitely be excluded. No No evidence of main pancreatic ductal dilatation. Spleen:  Within normal limits in size.  Adrenals/Urinary tract: Unremarkable. No evidence of nephrolithiasis or hydronephrosis. Stomach/Bowel: No dilated bowel loops. Diffuse mesenteric edema and minimal perihepatic ascites. Vascular/Lymphatic: No pathologically enlarged lymph nodes identified. No evidence of abdominal aortic aneurysm. Other:  None. Musculoskeletal:  No suspicious bone lesions identified. 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 No evidence of choledocholithiasis. Multiple tiny cystic foci within the pancreatic head at the site of distal common bile duct stricture. This may be due to chronic pancreatitis, however, pancreatic mass cannot definitely be excluded on this unenhanced exam. Recommend further imaging evaluation with pancreatic protocol abdomen CT without and with contrast Distended gallbladder with mild diffuse wall thickening, which is nonspecific. Differential diagnosis includes cholecystitis and other etiologies such as hypoalbuminemia, liver disease, or heart failure. Anasarca and minimal perihepatic ascites. Electronically Signed   By: Norleen DELENA Kil M.D.   On: 11/19/2024 17:48   US  Abdomen Limited RUQ (LIVER/GB) Result Date: 11/19/2024 EXAM: Right Upper Quadrant Abdominal Ultrasound 11/19/2024 11:48:11 AM TECHNIQUE: Real-time ultrasonography of the right upper quadrant of the abdomen was performed. COMPARISON: CT of the abdomen and pelvis dated 11/19/2024. CLINICAL HISTORY: Cholecystitis. FINDINGS: LIVER: The liver demonstrates normal echogenicity. No intrahepatic biliary ductal dilatation. No evidence of mass. BILIARY SYSTEM: Gallbladder wall thickness measures 7 mm. The patient is not focally tender over the gallbladder. No pericholecystic fluid. No cholelithiasis. The common bile duct is abnormally dilated, measuring 13.5 mm. No intrahepatic biliary ductal dilatation. OTHER: No right upper quadrant ascites. IMPRESSION: 1. Gallbladder wall thickening (7 mm) without focal  tenderness. 2. Dilated common bile duct (13.5 mm) without intrahepatic biliary ductal dilatation. Electronically signed by: Evalene Coho MD 11/19/2024 12:10 PM EST RP Workstation: [id number]   CT ABDOMEN PELVIS WO CONTRAST Result Date: 11/19/2024 CLINICAL DATA:  Abdominal pain. EXAM: CT ABDOMEN AND PELVIS WITHOUT CONTRAST TECHNIQUE: Multidetector CT imaging of  the abdomen and pelvis was performed following the standard protocol without IV contrast. RADIATION DOSE REDUCTION: This exam was performed according to the departmental dose-optimization program which includes automated exposure control, adjustment of the mA and/or kV according to patient size and/or use of iterative reconstruction technique. COMPARISON:  06/20/2013 FINDINGS: Lower chest: Dependent atelectasis. Hepatobiliary: No suspicious focal abnormality in the liver on this study without intravenous contrast. Gallbladder lumen is filled with high density material. This is a new finding since chest CTA 11/15/2024 and likely reflects vicarious excretion of intravenous contrast administered for that study. Gallbladder wall appears circumferentially thickened. Intrahepatic biliary duct dilatation evident. Common bile duct not well demonstrated on this noncontrast exam. Pancreas: No focal mass lesion. No dilatation of the main duct. No intraparenchymal cyst. No peripancreatic edema. Spleen: No splenomegaly. No suspicious focal mass lesion. Adrenals/Urinary Tract: No right adrenal nodule or mass. Soft tissue nodularity in the expected location of the left adrenal gland may reflect nodular thickening, small lymph nodes, or vascular anatomy. This is not well evaluated given lack of intravenous contrast material. Both kidneys are heterogeneous in appearance. Probable punctate stones bilaterally. No hydronephrosis. No hydroureter. The urinary bladder appears normal for the degree of distention. Stomach/Bowel: Stomach is distended with fluid. Duodenum is  normally positioned as is the ligament of Treitz. No small bowel wall thickening. No small bowel dilatation. The terminal ileum is normal. The appendix is not well visualized, but there is no edema or inflammation in the region of the cecal tip to suggest appendicitis. No gross colonic mass. No colonic wall thickening. Vascular/Lymphatic: There is advanced atherosclerotic calcification of the abdominal aorta without aneurysm. Possible borderline lymphadenopathy in the left para-aortic space. No pelvic sidewall lymphadenopathy. Reproductive: The prostate gland and seminal vesicles are unremarkable. Other: Small volume confluent edema is seen in the right upper quadrant of the abdomen adjacent to the gallbladder. Musculoskeletal: No worrisome lytic or sclerotic osseous abnormality. IMPRESSION: 1. Gallbladder lumen is filled with high density material, likely vicarious excretion of intravenous contrast administered for chest CTA 11/15/2024. Gallbladder wall appears circumferentially thickened with small volume confluent edema in the right upper quadrant of the abdomen adjacent to the gallbladder. Imaging features raise the question of acute cholecystitis. Right upper quadrant ultrasound may prove helpful. 2. Intrahepatic biliary duct dilatation. Common bile duct not well demonstrated on this noncontrast exam. Correlation with liver function test recommended. 3. Soft tissue nodularity in the expected location of the left adrenal gland may reflect nodular thickening, small lymph nodes, or vascular anatomy. This is not well evaluated given lack of intravenous contrast material. 4. Possible borderline lymphadenopathy in the left para-aortic space. 5.  Aortic Atherosclerosis (ICD10-I70.0). Electronically Signed   By: Camellia Candle M.D.   On: 11/19/2024 10:44   ECHOCARDIOGRAM COMPLETE Result Date: 11/16/2024    ECHOCARDIOGRAM REPORT   Patient Name:   Colin Keller Date of Exam: 11/16/2024 Medical Rec #:  [id number]      Height:       70.0 in Accession #:    [id number]    Weight:       154.0 lb Date of Birth:  10-11-1956     BSA:          1.868 m Patient Age:    68 years      BP:           140/67 mmHg Patient Gender: M             HR:  93 bpm. Exam Location:  Inpatient Procedure: 2D Echo, 3D Echo, Color Doppler, Cardiac Doppler and Strain Analysis            (Both Spectral and Color Flow Doppler were utilized during            procedure). Indications:    Pulmonary Embolus  History:        Patient has prior history of Echocardiogram examinations, most                 recent 10/08/2018. CHF; Risk Factors:Current Smoker,                 Hypertension, Diabetes and Dyslipidemia.  Sonographer:    Logan Shove RDCS Referring Phys: [id number] NILDA HERO GHERGHE IMPRESSIONS  1. Left ventricular ejection fraction, by estimation, is 45 to 50%. Left ventricular ejection fraction by 3D volume is 45 %. The left ventricle has mildly decreased function. The left ventricle demonstrates global hypokinesis. Left ventricular diastolic  parameters are consistent with Grade III diastolic dysfunction (restrictive). Elevated left atrial pressure. The average left ventricular global longitudinal strain is -15.3 %. The global longitudinal strain is abnormal.  2. Right ventricular systolic function is normal. The right ventricular size is moderately enlarged. Tricuspid regurgitation signal is inadequate for assessing PA pressure.  3. Left atrial size was severely dilated.  4. The mitral valve is normal in structure. Trivial mitral valve regurgitation. No evidence of mitral stenosis.  5. The aortic valve is normal in structure. Aortic valve regurgitation is not visualized. No aortic stenosis is present.  6. The inferior vena cava is dilated in size with >50% respiratory variability, suggesting right atrial pressure of 8 mmHg.  7. There is a long serpiginous density next to the oriface of the IVC into the RA. Review of this area on prior CTA shows no evidcene of  thrombus. This likely represents a prominent eustacion valve. FINDINGS  Left Ventricle: Left ventricular ejection fraction, by estimation, is 45 to 50%. Left ventricular ejection fraction by 3D volume is 45 %. The left ventricle has mildly decreased function. The left ventricle demonstrates global hypokinesis. The average left ventricular global longitudinal strain is -15.3 %. Strain was performed and the global longitudinal strain is abnormal. The left ventricular internal cavity size was normal in size. There is no left ventricular hypertrophy. Left ventricular diastolic parameters are consistent with Grade III diastolic dysfunction (restrictive). Elevated left atrial pressure. Right Ventricle: The right ventricular size is moderately enlarged. No increase in right ventricular wall thickness. Right ventricular systolic function is normal. Tricuspid regurgitation signal is inadequate for assessing PA pressure. Left Atrium: Left atrial size was severely dilated. Right Atrium: There is a long serpiginous density next to the oriface of the IVC into the RA. Review of this area on prior CTA shows no evidcene of thrombus. This likely represents a prominent eustacion valve. Right atrial size was normal in size. Pericardium: There is no evidence of pericardial effusion. Mitral Valve: The mitral valve is normal in structure. Mild to moderate mitral annular calcification. Trivial mitral valve regurgitation. No evidence of mitral valve stenosis. Tricuspid Valve: The tricuspid valve is normal in structure. Tricuspid valve regurgitation is not demonstrated. No evidence of tricuspid stenosis. Aortic Valve: The aortic valve is normal in structure. Aortic valve regurgitation is not visualized. No aortic stenosis is present. Aortic valve peak gradient measures 3.1 mmHg. Pulmonic Valve: The pulmonic valve was normal in structure. Pulmonic valve regurgitation is not visualized. No evidence of pulmonic stenosis. Aorta:  The aortic root  is normal in size and structure. Venous: The inferior vena cava is dilated in size with greater than 50% respiratory variability, suggesting right atrial pressure of 8 mmHg. IAS/Shunts: No atrial level shunt detected by color flow Doppler. Additional Comments: 3D was performed not requiring image post processing on an independent workstation and was abnormal.  LEFT VENTRICLE PLAX 2D LVIDd:         5.00 cm         Diastology LVIDs:         3.80 cm         LV e' medial:    5.44 cm/s LV PW:         1.00 cm         LV E/e' medial:  20.0 LV IVS:        1.10 cm         LV e' lateral:   9.03 cm/s LVOT diam:     2.00 cm         LV E/e' lateral: 12.1 LVOT Area:     3.14 cm LV IVRT:       137 msec        2D Longitudinal                                Strain                                2D Strain GLS   -15.0 %                                (A4C):                                2D Strain GLS   -15.7 %                                (A3C):                                2D Strain GLS   -15.3 %                                (A2C):                                2D Strain GLS   -15.3 %                                Avg:                                 3D Volume EF                                LV 3D EF:    Left  ventricul                                             ar                                             ejection                                             fraction                                             by 3D                                             volume is                                             45 %.                                 3D Volume EF:                                3D EF:        45 %                                LV EDV:       162 ml                                LV ESV:       89 ml                                LV SV:        74 ml RIGHT VENTRICLE            IVC RV Basal diam:  4.50 cm    IVC diam: 2.30 cm RV Mid diam:    3.50 cm RV S prime:      8.59 cm/s  PULMONARY VEINS TAPSE (M-mode): 1.1 cm     Diastolic Velocity: 44.20 cm/s                            S/D Velocity:       0.70                            Systolic Velocity:  31.50 cm/s LEFT ATRIUM              Index         RIGHT ATRIUM           Index LA diam:        5.20 cm  2.78 cm/m    RA Area:     20.30 cm LA Vol (A2C):   193.0 ml 103.33 ml/m  RA Volume:   57.10 ml  30.57 ml/m LA Vol (A4C):   98.8 ml  52.89 ml/m LA Biplane Vol: 143.0 ml 76.56 ml/m  AORTIC VALVE AV Area (Vmax): 2.58 cm AV Vmax:        88.50 cm/s AV Peak Grad:   3.1 mmHg LVOT Vmax:      72.60 cm/s  AORTA Ao Root diam: 3.20 cm Ao Asc diam:  3.20 cm MITRAL VALVE MV Area (PHT): 4.31 cm     SHUNTS MV Decel Time: 176 msec     Systemic Diam: 2.00 cm MV E velocity: 109.00 cm/s MV A velocity: 30.50 cm/s MV E/A ratio:  3.57 Wilbert Bihari MD Electronically signed by Wilbert Bihari MD Signature Date/Time: 11/16/2024/3:52:00 PM    Final    CT Angio Chest PE W and/or Wo Contrast Result Date: 11/15/2024 EXAM: CTA of the Chest with contrast for PE 11/15/2024 07:49:11 PM TECHNIQUE: CTA of the chest was performed after the administration of 60 mL of iohexol  (OMNIPAQUE ) 350 MG/ML injection. Multiplanar reformatted images are provided for review. MIP images are provided for review. Automated exposure control, iterative reconstruction, and/or weight based adjustment of the mA/kV was utilized to reduce the radiation dose to as low as reasonably achievable. COMPARISON: None available. CLINICAL HISTORY: sob w/ + d dimer FINDINGS: PULMONARY ARTERIES: Pulmonary arteries are adequately opacified for evaluation. A faint nonocclusive filling defect is present in the posterior basilar segmental artery of the left lower lobe. No other definite emboli are present. Main pulmonary artery is normal in caliber. MEDIASTINUM: The heart and pericardium demonstrate no acute abnormality. Dense atherosclerotic calcifications are present in the coronary arteries.  Atherosclerotic changes are present in the aortic arch and great vessel origins. 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 There is no acute abnormality of the thoracic aorta. LYMPH NODES: No mediastinal, hilar or axillary lymphadenopathy. LUNGS AND PLEURA: A moderate left pleural effusion is present. A small right pleural effusion is present. Mild dependent atelectasis is present bilaterally. A 5 mm nodule in the left upper lobe is best visualized on image 49 of series 6. Punctate nodules are present in the left lower lobe. An 8 mm nodule is present within the superior segment of the right lower lobe on image 65 of series 7. No pneumothorax. UPPER ABDOMEN: Limited images of the upper abdomen are unremarkable. SOFT TISSUES AND BONES: No acute bone or soft tissue abnormality. IMPRESSION: 1. Faint nonocclusive filling defect in the posterior basilar segmental artery of the left lower lobe, consistent with a small pulmonary embolism. No other definite emboli are present. 2. 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 3. Moderate left pleural effusion and small right pleural effusion. 4. Pulmonary nodules including a 5 mm left upper lobe nodule and an 8 mm right lower lobe nodule. For the 8 mm solid nodule, recommend non-contrast chest CT at 36 months; if high risk for malignancy, consider PET/CT or tissue sampling per Fleischner Society Guidelines. Electronically signed by: Lonni Necessary MD 11/15/2024 08:08 PM EST RP  Workstation: [id number]   DG Chest 2 View Result Date: 11/15/2024 EXAM: 2 VIEW(S) XRAY OF THE CHEST 11/15/2024 06:07:00 PM COMPARISON: 10/03/2023 status post coronary artery bypass graft. CLINICAL HISTORY: SOB. FINDINGS: LUNGS AND PLEURA: No focal pulmonary opacity. No pleural effusion. No pneumothorax. HEART AND MEDIASTINUM: No acute abnormality of the cardiac and mediastinal silhouettes. BONES AND SOFT TISSUES: No acute osseous abnormality.  IMPRESSION: 1. No acute cardiopulmonary process. Electronically signed by: [name] Seip MD 11/15/2024 06:13 PM EST RP Workstation: [id number]     I personally spent a total of 55 minutes minutes in the care of the patient today including preparing to see the patient, getting/reviewing separately obtained history, performing a medically appropriate exam/evaluation, counseling and educating, referring and communicating with other health care professionals, documenting clinical information in the EHR, communicating results, and coordinating care.    All questions were answered. The patient knows to call the clinic with any problems, questions or concerns. No barriers to learning was detected.  Colin PARAS Rouson, NP 12/23/20252:07 PM

## 2024-12-03 NOTE — Progress Notes (Signed)
 PHARMACY - ANTICOAGULATION CONSULT NOTE  Pharmacy Consult for IV heparin  Indication: pulmonary embolus (11/15/24)  Allergies[1]  Patient Measurements: Height: 5' 10 (177.8 cm) Weight: 65.1 kg (143 lb 8.3 oz) IBW/kg (Calculated) : 73 HEPARIN  DW (KG): 67.1  Vital Signs: Temp: 97.7 F (36.5 C) (12/23 1141) Temp Source: Oral (12/23 1141) BP: 139/81 (12/23 1141) Pulse Rate: 111 (12/23 1141)  Labs: Recent Labs    12/01/24 0343 12/02/24 0346 12/02/24 1855 12/03/24 0437  HGB 9.7* 8.5*  --  8.0*  HCT 29.2* 26.0*  --  24.3*  PLT 449* 366  --  387  LABPROT 16.1*  --   --  16.3*  INR 1.2  --   --  1.2  HEPARINUNFRC 0.46 0.47 0.22* 0.22*  CREATININE 3.17* 3.48*  --  3.45*    Estimated Creatinine Clearance: 18.3 mL/min (A) (by C-G formula based on SCr of 3.45 mg/dL (H)).  Assessment: Colin Keller is a 68 y.o. male admitted on 11/19/2024 with concern for cholecystitis s/p ERCP w/ stenting on 12/12 and percutaneous cholecystostomy by IR on 12/14. Recently diagnosed with new PE (11/15/24) and started on eliquis . Last dose eliquis  prior to admission 12/8 @ 9:30 pm. Pharmacy consulted to restart heparin  while apixaban  on hold pending need for renal biopsy.    Heparin  stopped 12/23 at 0600 in anticipation of renal biopsy, however, patient haas decided to postpone pending discussion with oncology.  Pharmacy was consulted to restart heparin  infusion.   Goal of Therapy:  Heparin  level 0.3-0.7 units/ml aPTT 66-102 seconds Monitor platelets by anticoagulation protocol: Yes   Plan:  Restart heparin  infusion at 1200 units/hr Check heparin  level in 8 hours. Monitor daily heparin  level, CBC, signs/symptoms of bleeding  F/u plans to resume apixaban  after renal biopsy   Suzen Lovelace, Pharm.D., BCPS Clinical Pharmacist Clinical phone for 12/03/2024 from 7:30-3:00 is [phone].  **Pharmacist phone directory can be found on [email or web address] listed under [facility].  12/03/2024 11:55  AM          [1]  Allergies Allergen Reactions   Amlodipine  Other (See Comments)    Leg swelling   Empagliflozin  Other (See Comments)    Euglycemic DKA   Amitriptyline  Other (See Comments)    Urinary retention   Lisinopril  Other (See Comments)    Hyperkalemia    Nortriptyline  Hcl Other (See Comments)    Vivid / bad dreams   Statins     Aches in Joints    Simvastatin  Other (See Comments)    Arthralgias

## 2024-12-03 NOTE — Assessment & Plan Note (Signed)
 Hgb continues to downtrend, now 8.0. No clinical signs of bleeding.  -Consider need for transfusion after biopsy -PM CBC after biopsy -Restart heparin  drip if stable

## 2024-12-03 NOTE — Assessment & Plan Note (Signed)
 Rate controlled at the moment. Asymptomatic this AM. Trops flat yesterday. BP WNL last ~12 hours.  -Cards following, appreciate their recs.  -Continue amiodarone  drip -continue imdur  60mg  daily -Continue hydralazine  50mg  PO q8h once taking PO -Continue carvedilol  6.25mg  BID w/ meals -D/c diltiazem  120mg  daily

## 2024-12-03 NOTE — Progress Notes (Addendum)
 Called patients wife Eliceo Gladu and daughter Heather per their request. They were concerned that Mr Monforte's impression was that he would die from his pancreatic cancer so he didn't feel like there was any point in doing the renal biopsy. They also noted that he was concerned this morning about bleeding and infection.  I relayed our conversation this morning where he discussed I'm tried of this and when probed further noted he was worried about pain and bleeding with his biopsy and didn't want to do it if it won't change things. I explained that the biopsy would show why his kidney function was worsened and if there were potentially other treatments that could help, but also that he can decline any interventions that he does not want. He stated he did not feel good and did not want to do the renal biopsy, I discussed we could postpone today and he can discuss his recent diagnosis of pancreatic cancer and treatment options with the oncologist today and then can revisit the kidney biopsy. I discussed that I did not imply or mean to imply that he shouldn't do the biopsy because of his cancer diagnosis, but that he had the freedom to want to wait if he wanted to speak to oncologist first or if he was worried about the procedure.  Relayed this to IR team and nephrology. Wife and daughter requested to speak with nephrology team about how urgent is biopsy and risks of delaying it. I noted I will pass on this message. I also discussed our team is available 24/7 to discuss any other questions they have. Discussed it can be normal to feel overwhelmed when receiving news of cancer and we want to support them in any way we can. They were appreciative of the call.   Rollene Keeling MD

## 2024-12-03 NOTE — Progress Notes (Signed)
 "  Rounding Note   Patient Name: Colin Keller Date of Encounter: 12/03/2024  [facility] Cardiologist: Redell Shallow, MD   Subjective  Pt on phone with wife, I was abel to speak with her, answered questions. He has no complaints, possible small BM today  Scheduled Meds:  sodium chloride    Intravenous Once   acetaminophen   650 mg Oral QID   carvedilol   6.25 mg Oral BID WC   Chlorhexidine  Gluconate Cloth  6 each Topical Daily   feeding supplement  1 Container Oral TID BM   hydrALAZINE   50 mg Oral Q8H   insulin  aspart  0-9 Units Subcutaneous TID WC   insulin  glargine  5 Units Subcutaneous Daily   isosorbide  mononitrate  60 mg Oral Daily   lactulose   10 g Oral BID   lidocaine   3 patch Transdermal Q24H   multivitamin with minerals  1 tablet Oral Daily   pantoprazole  (PROTONIX ) IV  40 mg Intravenous Q12H   polyethylene glycol  17 g Oral BID   senna  1 tablet Oral BID   sodium chloride  flush  5 mL Intracatheter Q8H   sucralfate   1 g Oral BID   tamsulosin   0.4 mg Oral Daily   Continuous Infusions:  amiodarone  30 mg/hr (12/03/24 1007)   heparin      PRN Meds: alum & mag hydroxide-simeth, artificial tears, melatonin, oxyCODONE , prochlorperazine , tetracaine    Vital Signs  Vitals:   12/03/24 0007 12/03/24 0434 12/03/24 0500 12/03/24 0744  BP: 113/76 114/64  136/80  Pulse: 74 97  (!) 108  Resp: 18 18  16   Temp:  98.5 F (36.9 C)  97.6 F (36.4 C)  TempSrc: Oral Oral  Oral  SpO2: 98% 98%  96%  Weight:   65.1 kg   Height:        Intake/Output Summary (Last 24 hours) at 12/03/2024 1027 Last data filed at 12/03/2024 9373 Gross per 24 hour  Intake 1174.32 ml  Output 600 ml  Net 574.32 ml      12/03/2024    5:00 AM 12/02/2024    2:31 AM 12/01/2024    3:51 AM  Last 3 Weights  Weight (lbs) 143 lb 8.3 oz 151 lb 0.2 oz 151 lb 10.8 oz  Weight (kg) 65.1 kg 68.5 kg 68.8 kg      Telemetry Afib with rates in the 90-110s - Personally Reviewed  ECG  Repeat  pending - Personally Reviewed  Physical Exam  GEN: No acute distress.   Neck: + JVD Cardiac: iRRR, no murmurs, rubs, or gallops.  Respiratory: Clear to auscultation bilaterally. GI: distended MS: 1+ pitting LE edema; No deformity. Neuro:  Nonfocal  Psych: Normal affect   Labs High Sensitivity Troponin:   Recent Labs  Lab 11/15/24 1708 11/15/24 1908 11/22/24 1735  TROPONINIHS 95* 87* 25*    Recent Labs  Lab 11/30/24 1220 11/30/24 1339 12/02/24 0911 12/02/24 1027 12/02/24 1340  TRNPT 62* 59* 97* 99* 96*       Chemistry Recent Labs  Lab 11/29/24 0805 11/29/24 0805 11/30/24 0938 12/01/24 0343 12/02/24 0346 12/03/24 0437  NA 136  --  142 139 138 138  K 4.5  --  5.0 4.3 4.5 4.3  CL 103  --  101 98 98 100  CO2 22  --  32 27 21* 25  GLUCOSE 141*  --  171* 332* 384* 244*  BUN 53*  --  52* 61* 73* 75*  CREATININE 2.77*  --  2.92* 3.17*  3.48* 3.45*  CALCIUM  8.8*  --  9.2 8.8* 8.6* 8.5*  MG  --    < > 2.3 2.4 2.6* 2.6*  PROT 5.8*  --  6.0* 5.9*  --   --   ALBUMIN  2.7*  --  2.8* 2.8*  --   --   AST 18  --  20 19  --   --   ALT 74*  --  65* 52*  --   --   ALKPHOS 704*  --  678* 620*  --   --   BILITOT 0.8  --  0.8 0.8  --   --   GFRNONAA 24*  --  22* 20* 18* 18*  ANIONGAP 11  --  9 14 19* 13   < > = values in this interval not displayed.    Lipids No results for input(s): CHOL, TRIG, HDL, LABVLDL, LDLCALC, CHOLHDL in the last 168 hours.  Hematology Recent Labs  Lab 12/01/24 0343 12/02/24 0346 12/03/24 0437  WBC 25.6* 21.0* 16.6*  RBC 3.13* 2.72* 2.55*  HGB 9.7* 8.5* 8.0*  HCT 29.2* 26.0* 24.3*  MCV 93.3 95.6 95.3  MCH 31.0 31.3 31.4  MCHC 33.2 32.7 32.9  RDW 15.6* 15.7* 15.7*  PLT 449* 366 387   Thyroid  Recent Labs  Lab 11/27/24 1421  TSH 2.480    BNPNo results for input(s): BNP, PROBNP in the last 168 hours.  DDimer No results for input(s): DDIMER in the last 168 hours.   Radiology  No results found.  Cardiac  Studies  Echo 11/27/24: 1. Left ventricular ejection fraction, by estimation, is 40 to 45%. The  left ventricle has mildly decreased function. The left ventricle  demonstrates global hypokinesis. There is mild concentric left ventricular  hypertrophy. Left ventricular diastolic  function could not be evaluated.   2. Right ventricular systolic function is moderately reduced.   3. Left atrial size was moderately dilated.   4. The mitral valve is degenerative. Trivial mitral valve regurgitation.  Severe mitral annular calcification.   5. The aortic valve is tricuspid. There is mild calcification of the  aortic valve. Aortic valve sclerosis/calcification is present, without any  evidence of aortic stenosis.   Patient Profile    68 y.o. male with a history of CAD s/p CABG (2019), PAD s/p left SFA angioplasty (2023), CKDIV, carotid artery disease who presented biliary sepsis s/p ERCP stenting (12/12) and cholecystostomy (12/14) and hospitalization c/b PE (12/5), new onset Afib with RVR (12/17) and anemia requiring 1 unit of pRBCs while on Eliquis  (12/17). Repeat TTE 12/17 with LVEF 40-45% (from 50-55% 12/13) in setting of Afib with RVR. He was started on amio gtt on 12/20 given inability to tolerate PO intake and had a brief interrupt in heparin  gtt at that time. He converted to NSR on 12/21. Hydralazine  was held on 12/20.    Converted back to Afib RVR 12/02/24 at ~0815 with abnormal EKG with ST changes, no chest pain. Renal biopsy scheduled 12/02/24 canceled due to RVR and HTN. Heparin  gtt restarted. Amiodarone  gtt restarted.   Assessment & Plan   Afib with RVR - initially converted on IV amiodarone , D/C'ed yesterday with plans for PO cardizem  - did not receive yesterday  - yesterday converted back to Afib RVR - heparin  temporarily held for renal biopsy which was ultimately canceled for RVR and EKG changes.  - amiodarone  restarted and he is better rate controlled today in the 90-100s range -  no chest pain, will repeat an  EKG today given ST depression yesterday - restarted coreg  6.25 mg BID yesterday - continue heparin  in anticipation for kidney biopsy, then transition to OAC  OK for renal biopsy from a cardiology standpoint. Following biopsy, can then entertain rhythm management with uninterrupted OAC.    EKG changes  CAD s/p CABG 2019 - in the setting of Afib RVR - ST depression in inferior and lateral leads - hs troponin mild and flat, likely due to RVR and not ACS - repeat EKG today pending now with better rate control   Acute on chronic renal insufficiency III-IV - nephrology following, ?nephrotic syndrome   Hypertension - coreg , 50 mg hydralazine  TID, 60 mg imdur    Acute on chronic systolic heart failure - LVEF 59-54% with moderately reduced RV function, severe MAC - GDMT with hydralazine , imdur , and coreg  - no ACEI/ARB/ARNI/MRA given renal function - remains volume up with 1+ B LE pitting edema - defer diuresis for nephrology   PE - diagnosed 11/15/24 --> OAC   DM with hyperglycemia - A1c 9.6% - per primary     For questions or updates, please contact [facility] Please consult [email or web address] for contact info under       Signed, Jon Nat Hails, PA  12/03/2024, 10:27 AM    "

## 2024-12-03 NOTE — Progress Notes (Addendum)
 " [facility] Progress Note    Assessment/ Plan:   AKI on CKD3B -baseline Cr ~1.9-2.2, AKI possibly prerenal injury.  AIN was possible.  Creatinine has overall improved but is fluctuating.  Peak Cr ~4.1 -He has nephrotic range proteinuria but not definitive nephrotic syndrome. 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 Most likely diabetic in nature but needs biopsy to rule out other causes definitively - Plan for kidney biopsy when deemed stable by interventional radiology.  Appreciate help -Supportive care for AKI at this point -After biopsy patient is okay for discharge and follow-up outpatient from nephrology perspective  Biliary obstruction Elevated LFTs-improving -s/p ERCP with stent, s/p chole tube w/ IR -GI and gen surg following -zosyn  switched to rocephin  and flagyl  -s/p EGD 1216, EUS reattempt 12/18: pancreatic mass found--likely pancreatic cancer with portal vein involvement--following.  Follow-up results  Afib with RVR -cardiology following -on amio and anticoagulation  HTN -on hydral, Imdur  and carvedilol  -consider arb if Crt remains stable  Uncontrolled DM2 with hyperglycemia -mgmt per primary service  PE -per primary service, A/C per primary -if primary GN found this may have been a contributor   Anemia -transfuse prn -would not use ESA at this time given possible pancreatic adenocarcinoma  ADDENDUM: team reported patient said he wanted to hold off on biopsy. Overwhelmed with cancer diagnosis and what that entails. I discussed with the patient's daughter that I don't think holding off on biopsy is a bad idea. His Crt is stable and in light of new cancer diagnosis I think any signs of autoimmune kidney phenomena we did find would like be attributable to the cancer. I think it is reasonable to hold off on biopsy for now and re-evaluate outpatient. Given crt stability we will sign off and arrange f/u in our office.   Subjective:   Patient  feels okay this morning.  Minimal edema at the ankles.  No other complaints.  Biopsy was delayed because of concerns regarding heart rate and stability   Objective:   BP 136/80 (BP Location: Left Arm)   Pulse (!) 108   Temp 97.6 F (36.4 C) (Oral)   Resp 16   Ht 5' 10 (1.778 m)   Wt 65.1 kg   SpO2 96%   BMI 20.59 kg/m   Intake/Output Summary (Last 24 hours) at 12/03/2024 0947 Last data filed at 12/03/2024 9373 Gross per 24 hour  Intake 1174.32 ml  Output 600 ml  Net 574.32 ml   Weight change: -3.4 kg  Physical Exam: Gen: NAD, lying in bed CVS: Normal rate, no rub Resp: normal wob, unlabored Abd: soft, mild to redness to palpation, mildly distended Ext: Trace edema at the bilateral ankles Neuro: awake/alert  Imaging: No results found.   Labs: BMET Recent Labs  Lab 11/27/24 0406 11/28/24 0506 11/29/24 0805 11/30/24 0938 12/01/24 0343 12/02/24 0346 12/03/24 0437  NA 139 135 136 142 139 138 138  K 4.3 4.3 4.5 5.0 4.3 4.5 4.3  CL 106 103 103 101 98 98 100  CO2 23 23 22  32 27 21* 25  GLUCOSE 120* 202* 141* 171* 332* 384* 244*  BUN 66* 58* 53* 52* 61* 73* 75*  CREATININE 3.38* 3.05* 2.77* 2.92* 3.17* 3.48* 3.45*  CALCIUM  8.5* 8.4* 8.8* 9.2 8.8* 8.6* 8.5*  PHOS  --  4.2  --   --   --   --   --    CBC Recent Labs  Lab 11/29/24 0805 11/30/24 0938 12/01/24 0343 12/02/24 0346 12/03/24  0437  WBC 13.5* 15.6* 25.6* 21.0* 16.6*  NEUTROABS 9.8*  --   --  19.3*  --   HGB 9.8* 10.4* 9.7* 8.5* 8.0*  HCT 29.4* 31.1* 29.2* 26.0* 24.3*  MCV 94.5 93.1 93.3 95.6 95.3  PLT 365 434* 449* 366 387    Medications:     sodium chloride    Intravenous Once   acetaminophen   650 mg Oral QID   carvedilol   6.25 mg Oral BID WC   Chlorhexidine  Gluconate Cloth  6 each Topical Daily   feeding supplement  1 Container Oral TID BM   hydrALAZINE   50 mg Oral Q8H   insulin  aspart  0-9 Units Subcutaneous TID WC   insulin  glargine  5 Units Subcutaneous Daily   isosorbide  mononitrate   60 mg Oral Daily   lactulose   10 g Oral BID   lidocaine   3 patch Transdermal Q24H   multivitamin with minerals  1 tablet Oral Daily   pantoprazole  (PROTONIX ) IV  40 mg Intravenous Q12H   polyethylene glycol  17 g Oral BID   senna  1 tablet Oral BID   sodium chloride  flush  5 mL Intracatheter Q8H   sucralfate   1 g Oral BID   tamsulosin   0.4 mg Oral Daily       "

## 2024-12-03 NOTE — Plan of Care (Signed)
 S: Notified by nursing staff of abdominal discomfort and distension. Went to evaluate at bedside. Patient c/f lack of BM, and abdominal pain.  O: General: Awake, alert, NAD Cardio: RRR Resp: Normal WOB on RA Abdomen: Mild distension, minimal tenderness to palpation, firm over left side. Bowel sounds active.  A/P Abdominal pain In setting of newly diagnosed pancreatic adenocarcinoma.  Reviewed abdominal imaging, moderate stool burden present. Patient with constipation, 1 BM yesterday after suppository. Afebrile, non-toxic appearing, low c/f acute abdomen at this time. -Trial Glycerin  suppository  -Continue to monitor

## 2024-12-03 NOTE — Assessment & Plan Note (Signed)
 Cr stable but elevated at 3.45. Adequate UOP. Renal biopsy today, BP has been WNL.  -Nephro consulted, appreciate their recs -AM BMP, Mg - holding hydrochlorothiazide 

## 2024-12-03 NOTE — Progress Notes (Signed)
 Chaplain responded to spiritual care consult. Pt welcomed me into the room and I made space for him to share his thoughts and emotions surrounding his recent cancer diagnosis. He expressed how difficult it is to be in the hospital for an extended period of time, estimating he's been stuck with needles probably 200 times or more since his admission, and that it's slowly killing him.  He assures me he is not suicidal, however he is tired of fighting and feels ready to be reunited with his son who died in 2021/12/27. [name] feels supported, but not always heard. He requested prayer, which I happily provided.  Wife Katheryn, daughter Heather, and granddaughter Aspen arrived toward the end of our conversation, and I provided a listening presence for them as well. All are aware that chaplains continue to remain available as needed.

## 2024-12-03 NOTE — Assessment & Plan Note (Signed)
 CBGs variable last 24 hours. Pt NPO since MN. Caution with excess LAI.  -5u LAI today, consider uptitrating moving forward -Continue sensitive SSI - Hold home metformin 

## 2024-12-03 NOTE — Progress Notes (Signed)
 "    Daily Progress Note  DOA: 11/19/2024 Hospital Day: 15  Cc: Pancreatic adenocarcinoma  ASSESSMENT    Brief narrative : 68 year old male admitted several days ago with abdominal pain and biliary obstruction.  ERCP with CBD stent placement on 11/22/2024.  Unable to obtain brushings at that time due to biliary of stricture.  Had a ERCP with CBD stenting across biliary stricture  11/22/2024. Brushings unable to be obtained at that time . Underwent EUS and FNA of a pancreatic head mass   Pancreatic adenocarcinoma Endosonographic appearance suspicious for adenocarcinoma/T2 N0 MX  FNA + adenocarcinoma  Cholecystitis  Status post cholecystostomy tube placement 11/24/2024 pulmonary embolism, new  New onset atrial fibrillation with RVR On amiodarone   Pulmonary Embolus On IV heparin   Acute on chronic systolic heart failure  AKI on CKD 3b Nephrology is following.  He has nephrotic range proteinuria.  Plan is for kidney biopsy by IR  Principal Problem:   Abdominal pain Active Problems:   HTN (hypertension)   T2DM (type 2 diabetes mellitus) (HCC)   Anemia   Coronary artery disease involving native coronary artery of native heart without angina pectoris   AKI (acute kidney injury)   Acute pulmonary embolism (HCC)   Chronic health problem   Cholecystitis   Pancreatic lesion   Biliary stricture (HCC)   Abnormal finding on GI tract imaging   Anticoagulated   Elevated liver enzymes   Biliary obstruction (HCC)   Leukocytosis   Biliary sepsis (HCC)   S/P ERCP   Bleeding from sphincterotomy site   Atrial fibrillation with RVR (HCC)   Primary hypertension   Atrial fibrillation with rapid ventricular response (HCC)   Malnutrition of moderate degree   Common bile duct dilation   Eye pain, left   Chronic systolic heart failure (HCC)    PLAN   If not a candidate for surgery then he will need bile duct stent exchange in 3 to 4 months which our office will  arrange   Subjective   Spirits are down.  Feels like it is the end of the world, aware of the cancer diagnosis.  No family around.  Wife had to put her 50 year old father in the hospital last night.    Objective     Recent Labs    12/01/24 0343 12/02/24 0346 12/03/24 0437  WBC 25.6* 21.0* 16.6*  HGB 9.7* 8.5* 8.0*  HCT 29.2* 26.0* 24.3*  MCV 93.3 95.6 95.3  PLT 449* 366 387   No results for input(s): FOLATE, VITAMINB12, FERRITIN, TIBC, IRONPCTSAT in the last 72 hours. Recent Labs    12/01/24 0343 12/02/24 0346 12/03/24 0437  NA 139 138 138  K 4.3 4.5 4.3  CL 98 98 100  CO2 27 21* 25  GLUCOSE 332* 384* 244*  BUN 61* 73* 75*  CREATININE 3.17* 3.48* 3.45*  CALCIUM  8.8* 8.6* 8.5*   Recent Labs    12/01/24 0343  PROT 5.9*  ALBUMIN  2.8*  AST 19  ALT 52*  ALKPHOS 620*  BILITOT 0.8    Imaging:  DG CHEST PORT 1 VIEW CLINICAL DATA:  Shortness of breath.  EXAM: PORTABLE CHEST 1 VIEW  COMPARISON:  Chest [place]  dated 11/15/2024.  FINDINGS: No focal consolidation, pleural effusion or pneumothorax. Mild central vascular congestion. The cardiac silhouette. Median sternotomy wires. No acute osseous pathology.  IMPRESSION: Mild central vascular congestion. No focal consolidation.  Electronically Signed   By: Vanetta Chou M.D.   On: 11/30/2024 12:27 DG Abd 1 View EXAM:  1 VIEW XRAY OF THE ABDOMEN 11/30/2024 04:08:00 AM  COMPARISON: CT of the abdomen and pelvis dated 11/19/2024.  CLINICAL HISTORY: [id number] Vomiting [id number] Vomiting  FINDINGS:  LINES, TUBES AND DEVICES: Biliary stent has been placed. Pigtail catheter is looped just beneath the liver.  BOWEL: There is moderate foreign fecal material within the colon. Nonobstructive bowel gas pattern.  SOFT TISSUES: Moderate vascular calcifications.  BONES: Mild levoscoliosis of the lumbar spine and multilevel chronic degenerative disc disease. Status post sternotomy. No acute  fracture.  IMPRESSION: 1. Biliary stent and right upper quadrant pigtail catheter in place. 2. Moderate colonic stool burden without evidence of bowel obstruction. 3. No acute findings.  Electronically signed by: Timothy Berrigan MD 11/30/2024 04:30 AM EST RP Workstation: [id number]     Scheduled inpatient medications:   sodium chloride    Intravenous Once   acetaminophen   650 mg Oral QID   carvedilol   6.25 mg Oral BID WC   Chlorhexidine  Gluconate Cloth  6 each Topical Daily   feeding supplement  1 Container Oral TID BM   hydrALAZINE   50 mg Oral Q8H   insulin  aspart  0-9 Units Subcutaneous TID WC   insulin  glargine  10 Units Subcutaneous Daily   isosorbide  mononitrate  60 mg Oral Daily   lactulose   10 g Oral BID   lidocaine   3 patch Transdermal Q24H   multivitamin with minerals  1 tablet Oral Daily   pantoprazole  (PROTONIX ) IV  40 mg Intravenous Q12H   polyethylene glycol  17 g Oral BID   senna  1 tablet Oral BID   sodium chloride  flush  5 mL Intracatheter Q8H   sucralfate   1 g Oral BID   tamsulosin   0.4 mg Oral Daily   Continuous inpatient infusions:   amiodarone  30 mg/hr (12/03/24 1007)   heparin      PRN inpatient medications: alum & mag hydroxide-simeth, artificial tears, melatonin, oxyCODONE , prochlorperazine , tetracaine   Vital signs in last 24 hours: Temp:  [97.6 F (36.4 C)-98.5 F (36.9 C)] 97.6 F (36.4 C) (12/23 0744) Pulse Rate:  [74-108] 108 (12/23 0744) Resp:  [16-19] 16 (12/23 0744) BP: (104-145)/(31-81) 136/80 (12/23 0744) SpO2:  [96 %-99 %] 96 % (12/23 0744) Weight:  [65.1 kg] 65.1 kg (12/23 0500) Last BM Date : 12/02/24  Intake/Output Summary (Last 24 hours) at 12/03/2024 1053 Last data filed at 12/03/2024 0626 Gross per 24 hour  Intake 934.32 ml  Output 600 ml  Net 334.32 ml    Intake/Output from previous day: 12/22 0701 - 12/23 0700 In: 1174.3 [P.O.:720; I.V.:454.3] Out: 1320 [Urine:1000; Drains:320] Intake/Output this shift: No  intake/output data recorded.   Physical Exam:  General: Alert male in NAD Pulmonary: Normal respiratory effort Abdomen: Soft, nondistended, nontender. Normal bowel sounds. Extremities: No lower extremity edema  Neurologic: Alert and oriented Psych: Pleasant. Cooperative     LOS: 14 days   Vina Dasen ,NP 12/03/2024, 10:53 AM       "

## 2024-12-03 NOTE — Assessment & Plan Note (Signed)
 BPH: Continue home tamsulosin  GERD: Continue home Protonix 

## 2024-12-03 NOTE — Progress Notes (Signed)
 Spoke with wife Gibson Lad and she wants to hold off on administering 1 unit of blood and renal biopsy. She talked with her husband and daughter and they all agreed on holding off for now. Wife wants to speak with patient's providers in the mean time.

## 2024-12-03 NOTE — Plan of Care (Deleted)
 [facility] Interim Progress Note  S: NAEON. He reports having a successful BM yesterday. Denies CP or dyspnea, reports that he did have some yesterday during the day. Denies eye pain this AM. Reports most of his questions have been answered at this time. Discussed some of the particulars of his renal biopsy.   O: BP 114/64 (BP Location: Left Arm)   Pulse 97   Temp 98.5 F (36.9 C) (Oral)   Resp 18   Ht 5' 10 (1.778 m)   Wt 68.5 kg   SpO2 98%   BMI 21.67 kg/m   General: Awake, alert, NAD. Communicates clearly. Cardio: Irregular rhythm, rate WNL. Normal S1, S2. No murmur, rub, gallop. B/l extremities are warm and well perfused. Mild pedal edema to the ankle R > L, improved vs my prior.  Resp: CTA bilaterally. No wheezes, rales, or rhonchi. Normal work of breathing on room air Abdomen: soft, non-tender, mildly distended. Normoactive BS auscultated. No guarding, rigidity, or rebound.  Neuro: AOx4. No focal deficits.   A/P:  Afbi w/ RVR / HTN Rate controlled at the moment. Asymptomatic this AM. Trops flat yesterday. BP WNL last ~12 hours.  -Cards following, appreciate their recs.  -Continue amiodarone  drip -continue imdur  60mg  daily -Continue hydralazine  50mg  PO q8h once taking PO -Continue carvedilol  6.25mg  BID w/ meals -D/c diltiazem  120mg  daily  AKI Cr stable but elevated at 3.45. Adequate UOP. Renal biopsy today, BP has been WNL.  -Nephro consulted, appreciate their recs -AM BMP, Mg  Biliary sepsis WBCs downtrending to 16.6 today. Afebrile, VSS. Perc chole tube in place. -Pancreatic biopsies confirming adenocarcinoma, Dr Autumn to see today.  -GI following, appreciate their recs -Continue pantoprazole  40mg  BID -Continue carafate  1g BID -Continue oxycodone  5mg  q8 prn  Anemia Hgb continues to downtrend, now 8.0. No clinical signs of bleeding.  -Consider need for transfusion after biopsy -PM CBC after biopsy -Restart heparin  drip if stable  T2DM CBGs variable last 24 hours.  Pt NPO since MN. Caution with excess LAI.  -5u LAI today, consider uptitrating moving forward -Continue sensitive SSI  Constipation 1 BM last 24 hours, first in multiple days. S/p 1 glycerin  suppository.  -Continue lactulose  BID today given NPO, can increase to TID moving forward as needed -D/c senna and miralax   Eye pain Continue tetracaine  eye drops PRN  NPO since MN for procedure Holding heparin  for procedure, restart timing per nephro/IR  Manon Jester, DO 12/03/2024, 5:35 AM PGY-1, [facility] Family Medicine Service pager [phone]

## 2024-12-04 ENCOUNTER — Inpatient Hospital Stay (HOSPITAL_COMMUNITY)

## 2024-12-04 DIAGNOSIS — C259 Malignant neoplasm of pancreas, unspecified: Secondary | ICD-10-CM

## 2024-12-04 HISTORY — PX: IR IMAGING GUIDED PORT INSERTION: IMG5740

## 2024-12-04 LAB — CBC
HCT: 26.8 % — ABNORMAL LOW (ref 39.0–52.0)
Hemoglobin: 8.8 g/dL — ABNORMAL LOW (ref 13.0–17.0)
MCH: 31.2 pg (ref 26.0–34.0)
MCHC: 32.8 g/dL (ref 30.0–36.0)
MCV: 95 fL (ref 80.0–100.0)
Platelets: 272 K/uL (ref 150–400)
RBC: 2.82 MIL/uL — ABNORMAL LOW (ref 4.22–5.81)
RDW: 15.7 % — ABNORMAL HIGH (ref 11.5–15.5)
WBC: 12.8 K/uL — ABNORMAL HIGH (ref 4.0–10.5)
nRBC: 0 % (ref 0.0–0.2)

## 2024-12-04 LAB — COMPREHENSIVE METABOLIC PANEL WITH GFR
ALT: 30 U/L (ref 0–44)
AST: 13 U/L — ABNORMAL LOW (ref 15–41)
Albumin: 2.7 g/dL — ABNORMAL LOW (ref 3.5–5.0)
Alkaline Phosphatase: 528 U/L — ABNORMAL HIGH (ref 38–126)
Anion gap: 13 (ref 5–15)
BUN: 75 mg/dL — ABNORMAL HIGH (ref 8–23)
CO2: 24 mmol/L (ref 22–32)
Calcium: 8.7 mg/dL — ABNORMAL LOW (ref 8.9–10.3)
Chloride: 99 mmol/L (ref 98–111)
Creatinine, Ser: 3.36 mg/dL — ABNORMAL HIGH (ref 0.61–1.24)
GFR, Estimated: 19 mL/min — ABNORMAL LOW
Glucose, Bld: 281 mg/dL — ABNORMAL HIGH (ref 70–99)
Potassium: 3.9 mmol/L (ref 3.5–5.1)
Sodium: 137 mmol/L (ref 135–145)
Total Bilirubin: 0.7 mg/dL (ref 0.0–1.2)
Total Protein: 5.7 g/dL — ABNORMAL LOW (ref 6.5–8.1)

## 2024-12-04 LAB — GLUCOSE, CAPILLARY
Glucose-Capillary: 342 mg/dL — ABNORMAL HIGH (ref 70–99)
Glucose-Capillary: 169 mg/dL — ABNORMAL HIGH (ref 70–99)
Glucose-Capillary: 54 mg/dL — ABNORMAL LOW (ref 70–99)
Glucose-Capillary: 47 mg/dL — ABNORMAL LOW (ref 70–99)
Glucose-Capillary: 61 mg/dL — ABNORMAL LOW (ref 70–99)
Glucose-Capillary: 147 mg/dL — ABNORMAL HIGH (ref 70–99)
Glucose-Capillary: 56 mg/dL — ABNORMAL LOW (ref 70–99)
Glucose-Capillary: 135 mg/dL — ABNORMAL HIGH (ref 70–99)

## 2024-12-04 LAB — HEPARIN LEVEL (UNFRACTIONATED): Heparin Unfractionated: 0.33 [IU]/mL (ref 0.30–0.70)

## 2024-12-04 LAB — MAGNESIUM: Magnesium: 2.7 mg/dL — ABNORMAL HIGH (ref 1.7–2.4)

## 2024-12-04 MED ORDER — OXYCODONE HCL 5 MG PO TABS: 10.0000 mg | ORAL_TABLET | Freq: Four times a day (QID) | ORAL | Status: DC | PRN

## 2024-12-04 MED ORDER — INSULIN GLARGINE 100 UNIT/ML ~~LOC~~ SOLN: 12.0000 [IU] | Freq: Every day | SUBCUTANEOUS | Status: DC

## 2024-12-04 MED ORDER — INSULIN GLARGINE 100 UNIT/ML ~~LOC~~ SOLN: 10.0000 [IU] | Freq: Every day | SUBCUTANEOUS | Status: DC

## 2024-12-04 MED ORDER — OXYCODONE HCL 5 MG PO TABS
5.0000 mg | ORAL_TABLET | Freq: Four times a day (QID) | ORAL | Status: DC | PRN
  Administered 2024-12-04: 5 mg via ORAL
  Filled 2024-12-04: qty 1

## 2024-12-04 MED ORDER — GLUCOSE 40 % PO GEL
2.0000 | ORAL | Status: AC
  Administered 2024-12-04: 62 g via ORAL

## 2024-12-04 MED ORDER — HEPARIN (PORCINE) 25000 UT/250ML-% IV SOLN
1250.0000 [IU]/h | INTRAVENOUS | Status: AC
  Administered 2024-12-04 (×2): 1200 [IU]/h via INTRAVENOUS
  Filled 2024-12-04 (×2): qty 250

## 2024-12-04 MED ORDER — OXYCODONE HCL 5 MG PO TABS: 5.0000 mg | ORAL_TABLET | Freq: Four times a day (QID) | ORAL | Status: DC | PRN

## 2024-12-04 MED ORDER — CARVEDILOL 12.5 MG PO TABS
12.5000 mg | ORAL_TABLET | Freq: Two times a day (BID) | ORAL | Status: DC
  Administered 2024-12-04 – 2024-12-06 (×4): 12.5 mg via ORAL
  Filled 2024-12-04 (×4): qty 1

## 2024-12-04 MED ORDER — OXYCODONE HCL 5 MG PO TABS
5.0000 mg | ORAL_TABLET | Freq: Once | ORAL | Status: AC
  Administered 2024-12-04: 5 mg via ORAL
  Filled 2024-12-04: qty 1

## 2024-12-04 MED ORDER — MIDAZOLAM HCL 2 MG/2ML IJ SOLN
INTRAMUSCULAR | Status: AC
  Filled 2024-12-04: qty 2

## 2024-12-04 MED ORDER — ENSURE PLUS HIGH PROTEIN PO LIQD
237.0000 mL | Freq: Three times a day (TID) | ORAL | Status: DC
  Administered 2024-12-04: 237 mL via ORAL

## 2024-12-04 MED ORDER — LIDOCAINE-EPINEPHRINE 1 %-1:100000 IJ SOLN
INTRAMUSCULAR | Status: AC
  Filled 2024-12-04: qty 1

## 2024-12-04 MED ORDER — DEXTROSE 50 % IV SOLN
1.0000 | Freq: Once | INTRAVENOUS | Status: AC
  Administered 2024-12-04: 50 mL via INTRAVENOUS
  Filled 2024-12-04: qty 50

## 2024-12-04 MED ORDER — GLYCERIN (LAXATIVE) 2 G RE SUPP
1.0000 | Freq: Once | RECTAL | Status: DC
  Filled 2024-12-04: qty 1

## 2024-12-04 MED ORDER — INSULIN GLARGINE 100 UNIT/ML ~~LOC~~ SOLN
10.0000 [IU] | Freq: Every day | SUBCUTANEOUS | Status: DC
  Administered 2024-12-05: 10 [IU] via SUBCUTANEOUS
  Filled 2024-12-04: qty 0.1

## 2024-12-04 MED ORDER — GLUCOSE 40 % PO GEL
ORAL | Status: AC
  Filled 2024-12-04: qty 2.42

## 2024-12-04 MED ORDER — FENTANYL CITRATE (PF) 100 MCG/2ML IJ SOLN
INTRAMUSCULAR | Status: AC
  Filled 2024-12-04: qty 2

## 2024-12-04 MED ORDER — HEPARIN SOD (PORK) LOCK FLUSH 100 UNIT/ML IV SOLN
INTRAVENOUS | Status: AC
  Filled 2024-12-04: qty 5

## 2024-12-04 MED ORDER — LIDOCAINE-EPINEPHRINE (PF) 1 %-1:200000 IJ SOLN
20.0000 mL | Freq: Once | INTRAMUSCULAR | Status: AC
  Administered 2024-12-04: 20 mL

## 2024-12-04 MED ORDER — INSULIN GLARGINE 100 UNIT/ML ~~LOC~~ SOLN: 15.0000 [IU] | Freq: Every day | SUBCUTANEOUS | Status: DC

## 2024-12-04 MED ORDER — FENTANYL CITRATE (PF) 100 MCG/2ML IJ SOLN
INTRAMUSCULAR | Status: AC | PRN
  Administered 2024-12-04: 50 ug via INTRAVENOUS
  Administered 2024-12-04: 25 ug via INTRAVENOUS

## 2024-12-04 MED ORDER — OXYCODONE HCL 5 MG PO TABS
10.0000 mg | ORAL_TABLET | Freq: Four times a day (QID) | ORAL | Status: DC | PRN
  Administered 2024-12-04 – 2024-12-05 (×4): 10 mg via ORAL
  Filled 2024-12-04 (×4): qty 2

## 2024-12-04 MED ORDER — MIDAZOLAM HCL (PF) 2 MG/2ML IJ SOLN
INTRAMUSCULAR | Status: AC | PRN
  Administered 2024-12-04: .5 mg via INTRAVENOUS
  Administered 2024-12-04: 1 mg via INTRAVENOUS

## 2024-12-04 MED ORDER — HEPARIN SOD (PORK) LOCK FLUSH 100 UNIT/ML IV SOLN
500.0000 [IU] | Freq: Once | INTRAVENOUS | Status: AC
  Administered 2024-12-04: 500 [IU]

## 2024-12-04 NOTE — Consult Note (Addendum)
 "    Chief Complaint: Patient was seen in consultation today for Port-A-Cath placement in assistance with chemotherapy administration for pancreatic adenocarcinoma.  Referring Provider(s): Ms. Olam Brunner, NP   Supervising Physician: Hughes Simmonds  Patient Status: [facility] - In-pt  Patient is Full Code  History of Present Illness: Colin Keller is a 68 y.o. male  with PMHx notable for pancreatic adenocarcinoma. Other relevant medical history is delineated below.  Patient is well-known to IR service, having most recently undergone cholecystostomy tube placement for biliary obstruction on 12/14, by Dr. Jennefer.  Patient with recent diagnosis of pancreatic adenocarcinoma, followed by Oncology (Dr. Autumn). Per review of Oncology note on 12/23, patient is recommended for combination of gemcitabine and Abraxane chemotherapy, to be initiated in the outpatient setting, and in an attempt to shrink the tumor and improve possible surgical outcomes. Patient will also undergo multidisciplinary case review at tumor conference for management planning, as well as a referral to surgical oncology for evaluation of surgical candidacy. Inpatient Port-A-Cath placement was recommended since anticoagulation management would be difficult in the outpatient setting.  Interventional Radiology was requested for Port-A-Cath placement. Patient is tentatively scheduled for same in IR today.   Patient is alert and laying in bed, calm.  Patient is currently complaining of significant right back pain that wraps around his right-side abdomen. Patient states he has not had breakfast today, aside from sips with meds, and Miralax . Patient denies any fevers, headache, chest pain, SOB, cough, nausea, recent vomiting or bleeding.    Past Medical History:  Diagnosis Date   Acute diastolic heart failure (HCC) 08/18/2018   AKI (acute kidney injury) 10/04/2023   Atherosclerosis of both carotid arteries 08/18/2020   Carotid dopplers  08/2019 40-59% bilateral stenosis and right subclavian stenosis   Bilateral carotid artery stenosis 08/18/2020   Right Carotid: Velocities in the right ICA are consistent with a 60-79%                 stenosis. Non-hemodynamically significant plaque <50% noted in                 the CCA. The ECA appears >50% stenosed. Proximal subclavian                 artery dilatation with elevated and turbulent flow just past the                 narrowing, measuring 1.6 cm.   Left Carotid: Velocities in the left ICA are    CHF (congestive heart failure) (HCC)    Chronic congestive heart failure (HCC) 10/04/2023   Transthoracic echocardiogram 10/08/2018: LVEF is approximately 50% with hypokinesis of the lateral (base/mid) and basal inferior walls T     Chronic left shoulder pain 03/18/2021   Chronic low back pain without sciatica 10/16/2008   H/o chronic LBP with h/o lumbar disc herniation and intermittent radicular pain  Chronic pain, on stable doses of Norco 10/325 TID. MRI in 2003 showing SMALL CENTRAL DISC HERNIATION AT L4-5.  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      CKD (chronic kidney disease), stage III (HCC)    Coronary artery disease    Coronary artery disease involving native heart with angina pectoris, unspecified vessel or lesion type 08/10/2023   COVID 10/04/2023   Diabetes mellitus    DKA (diabetic ketoacidosis) (HCC) 10/03/2023   Dyspnea    Fall 10/04/2023   GERD (gastroesophageal reflux disease)    Hx of  CABG 08/23/2018   LIMA to LAD, RIMA to RCA, SVG to OM3, EVH via right thigh   Hyperlipidemia    Hypertension    Hypertension associated with diabetes (HCC) 12/22/2008   Qualifier: Diagnosis of  By: Edrick MD, Susan     Hypertensive nephropathy 08/18/2020   Insulin  dependent type 2 diabetes mellitus (HCC) 12/22/2020   Left ventricular dysfunction 08/18/2020   TTE ([id number]): LVEF is approximately 50% with hypokinesis of the lateral (base/mid) and basal  inferior walls The cavity size was normal.      Long term (current) use of antithrombotics/antiplatelets 09/21/2022   Planned 31-months DAPT for DES for PAD on 09/21/2022.  Planned 6 months therapy. End 03/23/23.   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   LIMA to LAD, RIMA to RCA, SVG to OM3, EVH via right thigh   Sleeping difficulties 09/08/2019   Smoking greater than 20 pack years 08/22/2024   Subclavian artery stenosis, right 08/18/2020   Carotid dopplers 08/2019 40-59% bilateral stenosis and right subclavian stenosis   Tobacco abuse    Tobacco abuse disorder    Qualifier: Diagnosis of  By: Edrick MD, Devere      Past Surgical History:  Procedure Laterality Date   ABDOMINAL AORTOGRAM W/LOWER EXTREMITY N/A 09/21/2022   Procedure: ABDOMINAL AORTOGRAM W/LOWER EXTREMITY;  Surgeon: Darron Deatrice LABOR, MD;  Location: [facility] INVASIVE CV LAB;  Service: Cardiovascular;  Laterality: N/A;   BILIARY STENT PLACEMENT  11/22/2024   Procedure: INSERTION, STENT, BILE DUCT;  Surgeon: Abran Norleen SAILOR, MD;  Location: [facility] ENDOSCOPY;  Service: Gastroenterology;;   CORONARY ARTERY BYPASS GRAFT N/A 08/23/2018   Procedure: CORONARY ARTERY BYPASS GRAFTING (CABG) x 3; -Left Internal Mammary Artery to Left Anterior Descending Artery, -Right Internal Mammary Artery to Right Coronary Artery, -Saphenous Vein Graft to Obtuse Marginal;  ENDOSCOPIC HARVEST GREATER SAPHENOUS VEIN  -Right Thigh;  Surgeon: Dusty Sudie DEL, MD;  Location: [facility] OR;  Service: Open Heart Surgery;  Laterality:  N/A;   CORONARY PRESSURE/FFR STUDY N/A 08/20/2018   Procedure: INTRAVASCULAR PRESSURE WIRE/FFR STUDY;  Surgeon: Wonda Sharper, MD;  Location: [facility] INVASIVE CV LAB;  Service: Cardiovascular;  Laterality: N/A;   ERCP N/A 11/22/2024   Procedure: ERCP, WITH INTERVENTION IF INDICATED;  Surgeon: Abran Norleen SAILOR, MD;  Location: [facility] ENDOSCOPY;  Service: Gastroenterology;  Laterality: N/A;   ESOPHAGOGASTRODUODENOSCOPY N/A 11/26/2024   Procedure: EGD (ESOPHAGOGASTRODUODENOSCOPY);  Surgeon: Wilhelmenia Aloha Raddle., MD;  Location: [facility] ENDOSCOPY;  Service: Gastroenterology;  Laterality: N/A;   ESOPHAGOGASTRODUODENOSCOPY N/A 11/28/2024   Procedure: EGD (ESOPHAGOGASTRODUODENOSCOPY);  Surgeon: Wilhelmenia Aloha Raddle., MD;  Location: [facility] ENDOSCOPY;  Service: Gastroenterology;  Laterality: N/A;   EUS N/A 11/28/2024   Procedure: ULTRASOUND, UPPER GI TRACT, ENDOSCOPIC;  Surgeon: Wilhelmenia Aloha Raddle., MD;  Location: [facility] ENDOSCOPY;  Service: Gastroenterology;  Laterality:  N/A;  with possible FNA   FINE NEEDLE ASPIRATION  11/28/2024   Procedure: FINE NEEDLE ASPIRATION;  Surgeon: Wilhelmenia Aloha Raddle., MD;  Location: [facility] ENDOSCOPY;  Service: Gastroenterology;;   FOREIGN BODY REMOVAL  11/28/2024   Procedure: REMOVAL, FOREIGN BODY;  Surgeon: Wilhelmenia Aloha Raddle., MD;  Location: [facility] ENDOSCOPY;  Service: Gastroenterology;;  food in stomach   HEMOSTASIS CLIP PLACEMENT  11/26/2024   Procedure: CONTROL OF HEMORRHAGE, GI TRACT, ENDOSCOPIC, BY CLIPPING OR OVERSEWING;  Surgeon: Wilhelmenia Aloha Raddle., MD;  Location: [facility] ENDOSCOPY;  Service: Gastroenterology;;   IR PERC CHOLECYSTOSTOMY  11/24/2024   LEFT HEART CATH AND CORONARY ANGIOGRAPHY N/A 08/20/2018   Procedure: LEFT HEART CATH AND CORONARY ANGIOGRAPHY;  Surgeon: Wonda Sharper, MD;  Location: [facility] INVASIVE CV LAB;  Service: Cardiovascular;  Laterality: N/A;   PERIPHERAL VASCULAR ATHERECTOMY Left 09/21/2022   Procedure: PERIPHERAL VASCULAR ATHERECTOMY;  Surgeon: Darron Deatrice LABOR, MD;  Location: [facility] INVASIVE CV LAB;  Service: Cardiovascular;  Laterality: Left;  SFA   PERIPHERAL VASCULAR BALLOON ANGIOPLASTY Left 09/21/2022   Procedure: PERIPHERAL VASCULAR BALLOON ANGIOPLASTY;  Surgeon: Darron Deatrice LABOR, MD;  Location: [facility] INVASIVE CV LAB;  Service: Cardiovascular;  Laterality: Left;  SFA   SPHINCTEROTOMY  11/22/2024   Procedure: SPHINCTEROTOMY, BILIARY;  Surgeon: Abran Norleen SAILOR, MD;  Location: [facility] ENDOSCOPY;  Service: Gastroenterology;;   TEE WITHOUT CARDIOVERSION N/A 08/23/2018   Procedure: TRANSESOPHAGEAL ECHOCARDIOGRAM (TEE);  Surgeon: Dusty Sudie DEL, MD;  Location: [facility] OR;  Service: Open Heart Surgery;  Laterality: N/A;    Allergies: Amlodipine , Empagliflozin , Amitriptyline , Lisinopril , Nortriptyline  hcl, Statins, and Simvastatin   Medications: Prior to Admission medications  Medication Sig Start Date End Date Taking? Authorizing Provider  APIXABAN  (ELIQUIS ) VTE STARTER PACK (10MG  AND 5MG ) Take as directed on package: start with two-5mg  tablets twice daily for 7 days. On day 8, switch to one-5mg  tablet twice daily. 11/16/24  Yes Gherghe, Costin M, MD  aspirin  81 MG tablet Take 81 mg by mouth daily.   Yes [provider]  carvedilol  (COREG ) 6.25 MG tablet Take 1 tablet (6.25 mg total) by mouth 2 (two) times daily with a meal. 11/17/24  Yes Elicia Hamlet, MD  cetirizine (ZYRTEC) 10 MG chewable tablet Chew 10 mg by mouth as needed for allergies.   Yes [provider]  diltiazem  (CARDIZEM  CD) 180 MG 24 hr capsule Take 180 mg by mouth daily. 10/29/24  Yes [provider]  Evolocumab  (REPATHA  SURECLICK) 140 MG/ML SOAJ Inject 140 mg into the skin every 14 (fourteen) days. 03/12/24  Yes Darron Deatrice LABOR, MD  furosemide  (LASIX ) 40 MG tablet Take 0.5 tablets (20 mg total) by mouth daily. 11/18/24  Yes Elicia Hamlet, MD  Glucagon  (BAQSIMI  TWO PACK) 3 MG/DOSE POWD Use as directed PRN low glucose 10/30/24  Yes McDiarmid, Krystal BIRCH, MD  hydrALAZINE   (APRESOLINE ) 50 MG tablet Take 1 tablet (50 mg total) by mouth 3 (three) times daily. 08/22/24 08/17/25 Yes McDiarmid, Krystal BIRCH, MD  hydrochlorothiazide  (HYDRODIURIL ) 12.5 MG tablet Take 1 tablet (12.5 mg total) by mouth daily. 10/30/24  Yes McDiarmid, Krystal BIRCH, MD  Insulin  Glargine (BASAGLAR  KWIKPEN) 100 UNIT/ML DIAL AND INJECT 20 UNITS UNDER THE SKIN DAILY. Patient taking differently: Inject 10-12 Units into the skin daily. 08/19/24  Yes McDiarmid, Krystal BIRCH, MD  insulin  lispro (HUMALOG ) 100 UNIT/ML injection Inject 0.03-0.05 mLs (3-5 Units total) into the skin 3 (three) times daily with meals. 3-4 units prior to breakfast, 4-5 units prior to evening meal 08/08/24 02/04/25 Yes McDiarmid, Krystal  D, MD  metFORMIN  (GLUCOPHAGE ) 500 MG tablet Take 2 tablets (1,000 mg total) by mouth 2 (two) times daily with a meal. TAKE 2 TABLETS BY MOUTH TWICE DAILY WITH A MEAL 10/23/23 11/15/25 Yes McDiarmid, Krystal BIRCH, MD  pantoprazole  (PROTONIX ) 40 MG tablet Take 1 tablet (40 mg total) by mouth daily. 11/18/24  Yes Elicia Hamlet, MD  promethazine  (PHENERGAN ) 25 MG tablet Take 1 tablet (25 mg total) by mouth every 6 (six) hours as needed for nausea or vomiting. Patient taking differently: Take 12.5-25 mg by mouth every 6 (six) hours as needed for nausea or vomiting. 09/24/24  Yes McDiarmid, Krystal BIRCH, MD  tamsulosin  (FLOMAX ) 0.4 MG CAPS capsule Take 1 capsule (0.4 mg total) by mouth daily. 02/20/24  Yes McDiarmid, Krystal BIRCH, MD  apixaban  (ELIQUIS ) 5 MG TABS tablet Take 1 tablet (5 mg total) by mouth 2 (two) times daily. To begin after completion of Eliquis  (apixaban ) VTE starter pack Patient not taking: Reported on 11/19/2024 12/17/24   Gherghe, Costin M, MD  HYDROcodone -acetaminophen  (NORCO) 10-325 MG tablet Take 1 tablet by mouth 2 (two) times daily as needed. 11/29/24   McDiarmid, Krystal BIRCH, MD     Family History  Problem Relation Age of Onset   Heart disease Mother    Diabetes Mother    Heart failure Mother    Heart disease Father    Sudden  death Brother    Drug abuse Son     Social History   Socioeconomic History   Marital status: Married    Spouse name: Katheryn    Number of children: 2   Years of education: 10   Highest education level: 10th grade  Occupational History   Occupation: music therapist  Tobacco Use   Smoking status: Every Day    Current packs/day: 0.00    Average packs/day: 1 pack/day for 48.0 years (48.0 ttl pk-yrs)    Types: Cigarettes    Start date: 08/12/1970    Last attempt to quit: 08/12/2018    Years since quitting: 6.3   Smokeless tobacco: Never   Tobacco comments:    Quit post CABG in 2019 - previous 1ppd  Vaping Use   Vaping status: Never Used  Substance and Sexual Activity   Alcohol  use: No    Alcohol /week: 0.0 standard drinks of alcohol    Drug use: No   Sexual activity: Yes    Birth control/protection: Post-menopausal  Other Topics Concern   Not on file  Social History Narrative   Lives with his wife in [place].    Wife Katheryn is also [facility] patient.   Works odd-jobs in holiday representative.    Previously used marijuana -quit 10/03. Previous 1ppd smoker- quit 2019.   Surrogate decision maker: Colin Keller (wife)   Patient has one daughter.   Patients son died of a drug overdose 09-Mar-2021.   Patient has 2 dogs and 2 cats.       Social Drivers of Health   Tobacco Use: High Risk (11/28/2024)   Patient History    Smoking Tobacco Use: Every Day    Smokeless Tobacco Use: Never    Passive Exposure: Not on file  Financial Resource Strain: Low Risk (08/07/2023)   Overall Financial Resource Strain (CARDIA)    Difficulty of Paying Living Expenses: Not hard at all  Food Insecurity: No Food Insecurity (11/19/2024)   Epic    Worried About Radiation Protection Practitioner of Food in the Last Year: Never true    Ran Out of Food in the Last Year:  Never true  Transportation Needs: No Transportation Needs (11/19/2024)   Epic    Lack of Transportation (Medical): No    Lack of Transportation (Non-Medical): No  Physical Activity:  Sufficiently Active (08/07/2023)   Exercise Vital Sign    Days of Exercise per Week: 5 days    Minutes of Exercise per Session: 30 min  Stress: No Stress Concern Present (08/07/2023)   [employer] of Occupational Health - Occupational Stress Questionnaire    Feeling of Stress : Not at all  Social Connections: Moderately Isolated (11/19/2024)   Social Connection and Isolation Panel    Frequency of Communication with Friends and Family: More than three times a week    Frequency of Social Gatherings with Friends and Family: Three times a week    Attends Religious Services: Never    Active Member of Clubs or Organizations: No    Attends Banker Meetings: Never    Marital Status: Married  Depression (PHQ2-9): Low Risk (08/22/2024)   Depression (PHQ2-9)    PHQ-2 Score: 0  Alcohol  Screen: Low Risk (08/07/2023)   Alcohol  Screen    Last Alcohol  Screening Score (AUDIT): 0  Housing: Low Risk (11/19/2024)   Epic    Unable to Pay for Housing in the Last Year: No    Number of Times Moved in the Last Year: 0    Homeless in the Last Year: No  Utilities: Not At Risk (11/19/2024)   Epic    Threatened with loss of utilities: No  Health Literacy: Adequate Health Literacy (08/07/2023)   B1300 Health Literacy    Frequency of need for help with medical instructions: Rarely     Review of Systems: A 12 point ROS discussed and pertinent positives are indicated in the HPI above.  All other systems are negative.  Vital Signs: BP (!) 142/74   Pulse 95   Temp (!) 97.4 F (36.3 C) (Oral)   Resp 13   Ht 5' 10 (1.778 m)   Wt 132 lb 11.5 oz (60.2 kg)   SpO2 100%   BMI 19.04 kg/m   Advance Care Plan: The advanced care place/surrogate decision maker was discussed at the time of visit and the patient did not wish to discuss or was not able to name a surrogate decision maker or provide an advance care plan.  Physical Exam Vitals reviewed.  Constitutional:      General: He is not in acute  distress.    Appearance: Normal appearance.  HENT:     Mouth/Throat:     Mouth: Mucous membranes are dry.  Cardiovascular:     Rate and Rhythm: Normal rate. Rhythm irregular.     Pulses: Normal pulses.     Heart sounds: Normal heart sounds.  Pulmonary:     Effort: Pulmonary effort is normal.  Abdominal:     General: Abdomen is flat. There is distension.     Tenderness: There is abdominal tenderness in the right upper quadrant and right lower quadrant.  Musculoskeletal:        General: Normal range of motion.     Cervical back: Normal range of motion.     Comments: Generalized right back pain, from spine to flank, and scapula to pelvis, with radiation to RUQ and RLQ abdomen  Skin:    General: Skin is warm and dry.  Neurological:     Mental Status: He is alert and oriented to person, place, and time.  Psychiatric:        Mood  and Affect: Mood normal.        Behavior: Behavior normal.        Thought Content: Thought content normal.        Judgment: Judgment normal.     Imaging: DG CHEST PORT 1 VIEW Result Date: 11/30/2024 CLINICAL DATA:  Shortness of breath. EXAM: PORTABLE CHEST 1 VIEW COMPARISON:  Chest [place]  dated 11/15/2024. FINDINGS: No focal consolidation, pleural effusion or pneumothorax. Mild central vascular congestion. The cardiac silhouette. Median sternotomy wires. No acute osseous pathology. IMPRESSION: Mild central vascular congestion. No focal consolidation. Electronically Signed   By: Vanetta Chou M.D.   On: 11/30/2024 12:27   DG Abd 1 View Result Date: 11/30/2024 EXAM: 1 VIEW XRAY OF THE ABDOMEN 11/30/2024 04:08:00 AM COMPARISON: CT of the abdomen and pelvis dated 11/19/2024. CLINICAL HISTORY: [id number] Vomiting [id number] Vomiting FINDINGS: LINES, TUBES AND DEVICES: Biliary stent has been placed. Pigtail catheter is looped just beneath the liver. BOWEL: There is moderate foreign fecal material within the colon. Nonobstructive bowel gas pattern. SOFT TISSUES:  Moderate vascular calcifications. BONES: Mild levoscoliosis of the lumbar spine and multilevel chronic degenerative disc disease. Status post sternotomy. No acute fracture. IMPRESSION: 1. Biliary stent and right upper quadrant pigtail catheter in place. 2. Moderate colonic stool burden without evidence of bowel obstruction. 3. No acute findings. Electronically signed by: Evalene Coho MD 11/30/2024 04:30 AM EST RP Workstation: [id number]   VAS US  LOWER EXTREMITY VENOUS (DVT) Result Date: 11/28/2024  Lower Venous DVT Study Patient Name:  Colin Keller  Date of Exam:   11/26/2024 Medical Rec #: [id number]      Accession #:    [id number] Date of Birth: 1956-02-27      Patient Gender: M Patient Age:   35 years Exam [facility]  [facility] Procedure:      VAS US  LOWER EXTREMITY VENOUS (DVT) Referring Phys: CARINA BROWN --------------------------------------------------------------------------------  Indications: Pulmonary embolism.  Comparison Study: No prior exam. Performing Technologist: Edilia Elden Appl  Examination Guidelines: A complete evaluation includes B-mode imaging, spectral Doppler, color Doppler, and power Doppler as needed of all accessible portions of each vessel. Bilateral testing is considered an integral part of a complete examination. Limited examinations for reoccurring indications may be performed as noted. The reflux portion of the exam is performed with the patient in reverse Trendelenburg.  +---------+---------------+---------+-----------+----------+--------------+ RIGHT    CompressibilityPhasicitySpontaneityPropertiesThrombus Aging +---------+---------------+---------+-----------+----------+--------------+ CFV      Full           No       Yes                                 +---------+---------------+---------+-----------+----------+--------------+ SFJ      Full           Yes      Yes                                  +---------+---------------+---------+-----------+----------+--------------+ FV Prox  Full                                                        +---------+---------------+---------+-----------+----------+--------------+ FV Mid   Full                                                        +---------+---------------+---------+-----------+----------+--------------+  FV DistalFull                                                        +---------+---------------+---------+-----------+----------+--------------+ PFV      Full                                                        +---------+---------------+---------+-----------+----------+--------------+ POP      Full           Yes      Yes                                 +---------+---------------+---------+-----------+----------+--------------+ PTV      Full                                                        +---------+---------------+---------+-----------+----------+--------------+ PERO     Full                                                        +---------+---------------+---------+-----------+----------+--------------+ Plaque noted in adjacent arteries.  +---------+---------------+---------+-----------+----------+--------------+ LEFT     CompressibilityPhasicitySpontaneityPropertiesThrombus Aging +---------+---------------+---------+-----------+----------+--------------+ CFV      Full           Yes      Yes                                 +---------+---------------+---------+-----------+----------+--------------+ SFJ      Full           Yes      Yes                                 +---------+---------------+---------+-----------+----------+--------------+ FV Prox  Full                                                        +---------+---------------+---------+-----------+----------+--------------+ FV Mid   Full                                                         +---------+---------------+---------+-----------+----------+--------------+ FV DistalFull                                                        +---------+---------------+---------+-----------+----------+--------------+  PFV      Full                                                        +---------+---------------+---------+-----------+----------+--------------+ POP      Full           Yes      Yes                                 +---------+---------------+---------+-----------+----------+--------------+ PTV      Full                                                        +---------+---------------+---------+-----------+----------+--------------+ PERO     Full                                                        +---------+---------------+---------+-----------+----------+--------------+     Summary: BILATERAL: - No evidence of deep vein thrombosis seen in the lower extremities, bilaterally. -No evidence of popliteal cyst, bilaterally.   *See table(s) above for measurements and observations. Electronically signed by Fonda Rim on 11/28/2024 at 7:49:10 AM.    Final    ECHOCARDIOGRAM LIMITED Result Date: 11/27/2024    ECHOCARDIOGRAM LIMITED REPORT   Patient Name:   Colin Keller Date of Exam: 11/27/2024 Medical Rec #:  [id number]     Height:       70.0 in Accession #:    [id number]    Weight:       148.8 lb Date of Birth:  06-04-1956     BSA:          1.841 m Patient Age:    68 years      BP:           149/101 mmHg Patient Gender: M             HR:           130 bpm. Exam Location:  Inpatient Procedure: Limited Echo, Cardiac Doppler and Color Doppler (Both Spectral and            Color Flow Doppler were utilized during procedure). Indications:    Afib with RVR  History:        Patient has prior history of Echocardiogram examinations, most                 recent 11/23/2024. PAD; Risk Factors:Dyslipidemia, Diabetes and                 Hypertension.  Sonographer:    Carmelita Hartshorn RDCS, FE, PE Referring Phys: [phone] CARINA M BROWN IMPRESSIONS  1. Left ventricular ejection fraction, by estimation, is 40 to 45%. The left ventricle has mildly decreased function. The left ventricle demonstrates global hypokinesis. There is mild concentric left ventricular hypertrophy. Left ventricular diastolic function could not be evaluated.  2. Right ventricular systolic function is moderately reduced.  3. Left atrial size was  moderately dilated.  4. The mitral valve is degenerative. Trivial mitral valve regurgitation. Severe mitral annular calcification.  5. The aortic valve is tricuspid. There is mild calcification of the aortic valve. Aortic valve sclerosis/calcification is present, without any evidence of aortic stenosis. Conclusion(s)/Recommendation(s): EF hard to assess in setting of rapid AF. FINDINGS  Left Ventricle: Left ventricular ejection fraction, by estimation, is 40 to 45%. The left ventricle has mildly decreased function. The left ventricle demonstrates global hypokinesis. There is mild concentric left ventricular hypertrophy. Left ventricular diastolic function could not be evaluated. Left ventricular diastolic function could not be evaluated due to atrial fibrillation. Right Ventricle: Right ventricular systolic function is moderately reduced. Left Atrium: Left atrial size was moderately dilated. Mitral Valve: The mitral valve is degenerative in appearance. Severe mitral annular calcification. Trivial mitral valve regurgitation. Tricuspid Valve: Tricuspid valve regurgitation is trivial. Aortic Valve: The aortic valve is tricuspid. There is mild calcification of the aortic valve. Aortic valve sclerosis/calcification is present, without any evidence of aortic stenosis. Pulmonic Valve: The pulmonic valve was not well visualized. Venous: The inferior vena cava was not well visualized. LEFT VENTRICLE PLAX 2D LVIDd:         4.00 cm LVIDs:         3.30 cm LV PW:         1.30 cm LV IVS:         1.30 cm LVOT diam:     2.30 cm LVOT Area:     4.15 cm  LEFT ATRIUM         Index LA diam:    4.30 cm 2.34 cm/m   AORTA Ao Root diam: 3.60 cm  SHUNTS Systemic Diam: 2.30 cm Toribio Fuel MD Electronically signed by Toribio Fuel MD Signature Date/Time: 11/27/2024/1:22:06 PM    Final    DG ERCP Result Date: 11/27/2024 CLINICAL DATA:  Bile duct obstruction. EXAM: ERCP 50 right upper quadrant fluoroscopic images submitted for interpretation TECHNIQUE: Multiple spot images obtained with the fluoroscopic device and submitted for interpretation post-procedure. FLUOROSCOPY: Radiation Exposure Index (as provided by the fluoroscopic device): 242.43 mGy Kerma COMPARISON:  None Available. FINDINGS: Right upper quadrant images demonstrate a flexible endoscopy device with guidewires advancing into the pancreatic duct and common bile duct. Distal and mid common bile duct narrowing with proximal dilatation of the common bile duct and intrahepatic ducts. The common bile duct is mildly tortuous. Small filling defects identified near the ampulla on multiple initial images. Intra procedural placement of plastic common bile duct stent. IMPRESSION: Focal mid and distal stenosis of the common bile duct and interval placement of a plastic biliary stent. These images were submitted for radiologic interpretation only. Please see the procedural report for the amount of contrast and the fluoroscopy time utilized. Electronically Signed   By: Cordella Banner   On: 11/27/2024 09:03   US  RENAL Result Date: 11/25/2024 EXAM: US  Retroperitoneum Complete, Renal. 11/25/2024 03:04:13 PM TECHNIQUE: Real-time ultrasonography of the retroperitoneum renal was performed. COMPARISON: US  Renal 01/22/2020. CLINICAL HISTORY: Acute renal failure superimposed on stage 3 chronic kidney disease, unspecified acute renal failure type, unspecified whether stage 3a or 3b CKD. FINDINGS: LIMITATIONS: There is limited visualization of the kidney secondary  to bandage. RIGHT KIDNEY/URETER: Right kidney measures 9.2 x 4.9 x 4.8 cm. Normal cortical echogenicity. No hydronephrosis. No calculus. No mass. LEFT KIDNEY/URETER: Left kidney measures 9.5 x 5.9 x 5.1 cm. Normal cortical echogenicity. No hydronephrosis. No calculus. No mass. BLADDER: Foley catheter decompresses the bladder. IMPRESSION: 1.  Limited visualization of the kidneys due to overlying bandage. No hydronephrosis. Electronically signed by: Greig Pique MD 11/25/2024 08:01 PM EST RP Workstation: [id number]   IR Perc Cholecystostomy Result Date: 11/25/2024 INDICATION: 68 year old male with concern for acute cholecystitis. EXAM: Fluoroscopic and ultrasound-guided percutaneous cholecystostomy tube placement MEDICATIONS: The patient was currently receiving intravenous antibiotics as an inpatient, no additional antibiotics were administered. ANESTHESIA/SEDATION: Moderate (conscious) sedation was employed during this procedure. A total of Versed  2 mg and Fentanyl  100 mcg was administered intravenously. Moderate Sedation Time: 6 minutes. The patient's level of consciousness and vital signs were monitored continuously by radiology nursing throughout the procedure under my direct supervision. FLUOROSCOPY TIME:  One mGy reference air kerma COMPLICATIONS: None immediate. PROCEDURE: Informed written consent was obtained from the patient after a thorough discussion of the procedural risks, benefits and alternatives. All questions were addressed. Maximal Sterile Barrier Technique was utilized including caps, mask, sterile gowns, sterile gloves, sterile drape, hand hygiene and skin antiseptic. A timeout was performed prior to the initiation of the procedure. The patient was placed supine on the angiographic table. The patient's right upper quadrant was then prepped and draped in normal sterile fashion with maximum sterile barrier. Ultrasound demonstrates a distended gallbladder. Subdermal Local anesthesia was provided at  the planned skin entry site. Under ultrasound guidance, deeper local anesthetic was provided through intercostal muscles and along the liver capsule. Ultrasound was used to puncture the gallbladder using a 18 gauge trocar needle via a subcostal, transabdominal approach. A 0.035 inch exchange wire was placed in the tract was dilated. A 10 French multipurpose drainage catheter was advanced into the gallbladder lumen. Approximately 100 cc of bilious aspirate was a vacuo aided. Contrast injection demonstrated nondistended gallbladder without visualization of the cystic duct. The drain was then secured in place using a 0-silk suture and a Stayfix device. A sterile dressing was applied. The tube was placed to bulb suction. A culture was sent to the lab for analysis. The patient tolerated procedure well without evidence of immediate complication was transferred back to the floor in stable condition. IMPRESSION: Successful placement of percutaneous, transabdominal 10 French cholecystostomy tube. Ester Sides, MD [facility] Radiology Electronically Signed   By: Ester Sides M.D.   On: 11/25/2024 07:27   ECHOCARDIOGRAM LIMITED Result Date: 11/23/2024    ECHOCARDIOGRAM LIMITED REPORT   Patient Name:   Colin Keller Date of Exam: 11/23/2024 Medical Rec #:  [id number]     Height:       70.0 in Accession #:    [id number]    Weight:       148.8 lb Date of Birth:  [birth year]     BSA:          1.841 m Patient Age:    68 years      BP:           131/69 mmHg Patient Gender: M             HR:           59 bpm. Exam Location:  Inpatient Procedure: Limited Echo (Both Spectral and Color Flow Doppler were utilized            during procedure). Indications:    R94.31 Abnormal EKG  History:        Patient has prior history of Echocardiogram examinations, most                 recent 11/16/2024. CAD.  Sonographer:  Nathanel Devonshire Referring Phys: [id number] LAUREN E AUTRY IMPRESSIONS  1. Limited  echo with limited images  2. Left ventricular ejection fraction, by estimation, is 50 to 55%. Left ventricular ejection fraction by 2D MOD biplane is 52.0 %. The left ventricle has low normal function. The left ventricle demonstrates global hypokinesis. There is mild left ventricular hypertrophy. Left ventricular diastolic parameters are consistent with Grade III diastolic dysfunction (restrictive). Elevated left ventricular end-diastolic pressure.  3. The mitral valve is degenerative. Trivial mitral valve regurgitation.  4. The inferior vena cava is normal in size with <50% respiratory variability, suggesting right atrial pressure of 8 mmHg. Comparison(s): Changes from prior study are noted. 11/16/2024: LVEF 45-50%, grade III DD. FINDINGS  Left Ventricle: Left ventricular ejection fraction, by estimation, is 50 to 55%. Left ventricular ejection fraction by 2D MOD biplane is 52.0 %. The left ventricle has low normal function. The left ventricle demonstrates global hypokinesis. There is mild left ventricular hypertrophy. Left ventricular diastolic parameters are consistent with Grade III diastolic dysfunction (restrictive). Elevated left ventricular end-diastolic pressure. Mitral Valve: The mitral valve is degenerative in appearance. Mild to moderate mitral annular calcification. Trivial mitral valve regurgitation. Venous: The inferior vena cava is normal in size with less than 50% respiratory variability, suggesting right atrial pressure of 8 mmHg.  LV Volumes (MOD)               Biplane EF (MOD) LV vol d, MOD    131.0 ml      LV Biplane EF:   Left A2C:                                            ventricular LV vol d, MOD    145.0 ml                       ejection A4C:                                            fraction by LV vol s, MOD    60.3 ml                        2D MOD A2C:                                            biplane is LV vol s, MOD    73.1 ml                        52.0 %. A4C: LV SV MOD A2C:   70.7  ml       Diastology LV SV MOD A4C:   145.0 ml      LV e' medial:   3.48 cm/s LV SV MOD BP:    73.1 ml       LV E/e' medial: 34.8  MITRAL VALVE MV Area (PHT): 4.63 cm MV Decel Time: 164 msec MV E velocity: 121.00 cm/s MV A velocity: 51.90 cm/s MV E/A ratio:  2.33 Vinie Maxcy MD Electronically signed by Vinie Maxcy MD Signature Date/Time: 11/23/2024/12:26:13 PM  Final    DG C-Arm 1-60 Min-No Report Result Date: 11/22/2024 Fluoroscopy was utilized by the requesting physician.  No radiographic interpretation.   DG C-Arm 1-60 Min-No Report Result Date: 11/22/2024 Fluoroscopy was utilized by the requesting physician.  No radiographic interpretation.   NM Hepatobiliary Liver Func Result Date: 11/20/2024 EXAM: NM HEPATOBILLARY SCAN 11/20/2024 04:18:31 PM TECHNIQUE: RADIOPHARMACEUTICAL: 7.5 mCi Tc-64m mebrofenin  Dynamic images of the abdomen and pelvis were obtained in the anterior projection for 90 minutes after intravenous administration of radiopharmaceutical. COMPARISON: MRI 11/19/2024. CLINICAL HISTORY: Cholecystitis. FINDINGS: There is uniform uptake within the liver. No activity is present within the common bile duct over the entirety of the exam. The gallbladder cannot be assessed without filling of the common duct. Findings concerning for distal common bile duct obstruction in light of the prior day MRCP. IMPRESSION: 1. Findings concerning for distal common bile duct obstruction in light of the prior day MRCP. Electronically signed by: Norleen Boxer MD 11/20/2024 04:26 PM EST RP Workstation: [id number]   MR ABDOMEN MRCP WO CONTRAST Result Date: 11/19/2024 CLINICAL DATA:  Abdominal pain. Gallbladder wall thickening and biliary ductal dilatation on recent ultrasound. EXAM: MRI ABDOMEN WITHOUT CONTRAST  (INCLUDING MRCP) TECHNIQUE: Multiplanar multisequence MR imaging of the abdomen was performed. Heavily T2-weighted images of the biliary and pancreatic ducts were obtained, and three-dimensional  MRCP images were rendered by post processing. COMPARISON:  None Available. FINDINGS: Lower chest: No acute findings. Hepatobiliary:  No masses visualized on this unenhanced exam. The gallbladder is mildly distended and shows mild diffuse wall thickening. No definite gallstones are seen. Pericholecystic edema is seen but is similar to diffuse mesenteric, retroperitoneal, and body wall edema seen throughout the abdomen. Diffuse biliary ductal dilatation is seen, with common bile duct measuring 14 mm in diameter. No evidence of choledocholithiasis. Stricture of the distal common bile duct is seen within the pancreatic head. Pancreas: Evaluation is limited by lack of intravenous contrast and some motion artifact. Multiple tiny cystic foci are seen within the pancreatic head at the site of distal common bile duct stricture. This may be due to chronic pancreatitis, however, pancreatic mass cannot definitely be excluded. No No evidence of main pancreatic ductal dilatation. Spleen:  Within normal limits in size. Adrenals/Urinary tract: Unremarkable. No evidence of nephrolithiasis or hydronephrosis. Stomach/Bowel: No dilated bowel loops. Diffuse mesenteric edema and minimal perihepatic ascites. Vascular/Lymphatic: No pathologically enlarged lymph nodes identified. No evidence of abdominal aortic aneurysm. Other:  None. Musculoskeletal:  No suspicious bone lesions identified. 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 No evidence of choledocholithiasis. Multiple tiny cystic foci within the pancreatic head at the site of distal common bile duct stricture. This may be due to chronic pancreatitis, however, pancreatic mass cannot definitely be excluded on this unenhanced exam. Recommend further imaging evaluation with pancreatic protocol abdomen CT without and with contrast Distended gallbladder with mild diffuse wall thickening, which is nonspecific. Differential diagnosis includes  cholecystitis and other etiologies such as hypoalbuminemia, liver disease, or heart failure. Anasarca and minimal perihepatic ascites. Electronically Signed   By: Norleen DELENA Kil M.D.   On: 11/19/2024 17:48   MR 3D Recon At Scanner Result Date: 11/19/2024 CLINICAL DATA:  Abdominal pain. Gallbladder wall thickening and biliary ductal dilatation on recent ultrasound. EXAM: MRI ABDOMEN WITHOUT CONTRAST  (INCLUDING MRCP) TECHNIQUE: Multiplanar multisequence MR imaging of the abdomen was performed. Heavily T2-weighted images of the biliary and pancreatic ducts were obtained, and three-dimensional MRCP images were rendered by post processing. COMPARISON:  None Available. FINDINGS: Lower chest: No acute findings. Hepatobiliary:  No masses visualized on this unenhanced exam. The gallbladder is mildly distended and shows mild diffuse wall thickening. No definite gallstones are seen. Pericholecystic edema is seen but is similar to diffuse mesenteric, retroperitoneal, and body wall edema seen throughout the abdomen. Diffuse biliary ductal dilatation is seen, with common bile duct measuring 14 mm in diameter. No evidence of choledocholithiasis. Stricture of the distal common bile duct is seen within the pancreatic head. Pancreas: Evaluation is limited by lack of intravenous contrast and some motion artifact. Multiple tiny cystic foci are seen within the pancreatic head at the site of distal common bile duct stricture. This may be due to chronic pancreatitis, however, pancreatic mass cannot definitely be excluded. No No evidence of main pancreatic ductal dilatation. Spleen:  Within normal limits in size. Adrenals/Urinary tract: Unremarkable. No evidence of nephrolithiasis or hydronephrosis. Stomach/Bowel: No dilated bowel loops. Diffuse mesenteric edema and minimal perihepatic ascites. Vascular/Lymphatic: No pathologically enlarged lymph nodes identified. No evidence of abdominal aortic aneurysm. Other:  None. Musculoskeletal:   No suspicious bone lesions identified. 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 No evidence of choledocholithiasis. Multiple tiny cystic foci within the pancreatic head at the site of distal common bile duct stricture. This may be due to chronic pancreatitis, however, pancreatic mass cannot definitely be excluded on this unenhanced exam. Recommend further imaging evaluation with pancreatic protocol abdomen CT without and with contrast Distended gallbladder with mild diffuse wall thickening, which is nonspecific. Differential diagnosis includes cholecystitis and other etiologies such as hypoalbuminemia, liver disease, or heart failure. Anasarca and minimal perihepatic ascites. Electronically Signed   By: Norleen DELENA Kil M.D.   On: 11/19/2024 17:48   US  Abdomen Limited RUQ (LIVER/GB) Result Date: 11/19/2024 EXAM: Right Upper Quadrant Abdominal Ultrasound 11/19/2024 11:48:11 AM TECHNIQUE: Real-time ultrasonography of the right upper quadrant of the abdomen was performed. COMPARISON: CT of the abdomen and pelvis dated 11/19/2024. CLINICAL HISTORY: Cholecystitis. FINDINGS: LIVER: The liver demonstrates normal echogenicity. No intrahepatic biliary ductal dilatation. No evidence of mass. BILIARY SYSTEM: Gallbladder wall thickness measures 7 mm. The patient is not focally tender over the gallbladder. No pericholecystic fluid. No cholelithiasis. The common bile duct is abnormally dilated, measuring 13.5 mm. No intrahepatic biliary ductal dilatation. OTHER: No right upper quadrant ascites. IMPRESSION: 1. Gallbladder wall thickening (7 mm) without focal tenderness. 2. Dilated common bile duct (13.5 mm) without intrahepatic biliary ductal dilatation. Electronically signed by: Evalene Coho MD 11/19/2024 12:10 PM EST RP Workstation: [id number]   CT ABDOMEN PELVIS WO CONTRAST Result Date: 11/19/2024 CLINICAL DATA:  Abdominal pain. EXAM: CT ABDOMEN AND PELVIS WITHOUT CONTRAST  TECHNIQUE: Multidetector CT imaging of the abdomen and pelvis was performed following the standard protocol without IV contrast. RADIATION DOSE REDUCTION: This exam was performed according to the departmental dose-optimization program which includes automated exposure control, adjustment of the mA and/or kV according to patient size and/or use of iterative reconstruction technique. COMPARISON:  06/20/2013 FINDINGS: Lower chest: Dependent atelectasis. Hepatobiliary: No suspicious focal abnormality in the liver on this study without intravenous contrast. Gallbladder lumen is filled with high density material. This is a new finding since chest CTA 11/15/2024 and likely reflects vicarious excretion of intravenous contrast administered for that study. Gallbladder wall appears circumferentially thickened. Intrahepatic biliary duct dilatation evident. Common bile duct not well demonstrated on this noncontrast exam. Pancreas: No focal mass lesion. No dilatation of the main duct. No intraparenchymal cyst. No peripancreatic edema. Spleen:  No splenomegaly. No suspicious focal mass lesion. Adrenals/Urinary Tract: No right adrenal nodule or mass. Soft tissue nodularity in the expected location of the left adrenal gland may reflect nodular thickening, small lymph nodes, or vascular anatomy. This is not well evaluated given lack of intravenous contrast material. Both kidneys are heterogeneous in appearance. Probable punctate stones bilaterally. No hydronephrosis. No hydroureter. The urinary bladder appears normal for the degree of distention. Stomach/Bowel: Stomach is distended with fluid. Duodenum is normally positioned as is the ligament of Treitz. No small bowel wall thickening. No small bowel dilatation. The terminal ileum is normal. The appendix is not well visualized, but there is no edema or inflammation in the region of the cecal tip to suggest appendicitis. No gross colonic mass. No colonic wall thickening.  Vascular/Lymphatic: There is advanced atherosclerotic calcification of the abdominal aorta without aneurysm. Possible borderline lymphadenopathy in the left para-aortic space. No pelvic sidewall lymphadenopathy. Reproductive: The prostate gland and seminal vesicles are unremarkable. Other: Small volume confluent edema is seen in the right upper quadrant of the abdomen adjacent to the gallbladder. Musculoskeletal: No worrisome lytic or sclerotic osseous abnormality. IMPRESSION: 1. Gallbladder lumen is filled with high density material, likely vicarious excretion of intravenous contrast administered for chest CTA 11/15/2024. Gallbladder wall appears circumferentially thickened with small volume confluent edema in the right upper quadrant of the abdomen adjacent to the gallbladder. Imaging features raise the question of acute cholecystitis. Right upper quadrant ultrasound may prove helpful. 2. Intrahepatic biliary duct dilatation. Common bile duct not well demonstrated on this noncontrast exam. Correlation with liver function test recommended. 3. Soft tissue nodularity in the expected location of the left adrenal gland may reflect nodular thickening, small lymph nodes, or vascular anatomy. This is not well evaluated given lack of intravenous contrast material. 4. Possible borderline lymphadenopathy in the left para-aortic space. 5.  Aortic Atherosclerosis (ICD10-I70.0). Electronically Signed   By: Camellia Candle M.D.   On: 11/19/2024 10:44   ECHOCARDIOGRAM COMPLETE Result Date: 11/16/2024    ECHOCARDIOGRAM REPORT   Patient Name:   Colin Keller Date of Exam: 11/16/2024 Medical Rec #:  [id number]     Height:       70.0 in Accession #:    [id number]    Weight:       154.0 lb Date of Birth:  [birth year]     BSA:          1.868 m Patient Age:    68 years      BP:           140/67 mmHg Patient Gender: M             HR:           93 bpm. Exam Location:  Inpatient Procedure: 2D Echo, 3D Echo, Color Doppler, Cardiac Doppler and  Strain Analysis            (Both Spectral and Color Flow Doppler were utilized during            procedure). Indications:    Pulmonary Embolus  History:        Patient has prior history of Echocardiogram examinations, most                 recent 10/08/2018. CHF; Risk Factors:Current Smoker,                 Hypertension, Diabetes and Dyslipidemia.  Sonographer:    Logan Shove RDCS Referring Phys: [phone] NILDA HERO [employer] IMPRESSIONS  1. Left ventricular ejection fraction, by estimation, is 45 to 50%. Left ventricular ejection fraction by 3D volume is 45 %. The left ventricle has mildly decreased function. The left ventricle demonstrates global hypokinesis. Left ventricular diastolic  parameters are consistent with Grade III diastolic dysfunction (restrictive). Elevated left atrial pressure. The average left ventricular global longitudinal strain is -15.3 %. The global longitudinal strain is abnormal.  2. Right ventricular systolic function is normal. The right ventricular size is moderately enlarged. Tricuspid regurgitation signal is inadequate for assessing PA pressure.  3. Left atrial size was severely dilated.  4. The mitral valve is normal in structure. Trivial mitral valve regurgitation. No evidence of mitral stenosis.  5. The aortic valve is normal in structure. Aortic valve regurgitation is not visualized. No aortic stenosis is present.  6. The inferior vena cava is dilated in size with >50% respiratory variability, suggesting right atrial pressure of 8 mmHg.  7. There is a long serpiginous density next to the oriface of the IVC into the RA. Review of this area on prior CTA shows no evidcene of thrombus. This likely represents a prominent eustacion valve. FINDINGS  Left Ventricle: Left ventricular ejection fraction, by estimation, is 45 to 50%. Left ventricular ejection fraction by 3D volume is 45 %. The left ventricle has mildly decreased function. The left ventricle demonstrates global hypokinesis. The average  left ventricular global longitudinal strain is -15.3 %. Strain was performed and the global longitudinal strain is abnormal. The left ventricular internal cavity size was normal in size. There is no left ventricular hypertrophy. Left ventricular diastolic parameters are consistent with Grade III diastolic dysfunction (restrictive). Elevated left atrial pressure. Right Ventricle: The right ventricular size is moderately enlarged. No increase in right ventricular wall thickness. Right ventricular systolic function is normal. Tricuspid regurgitation signal is inadequate for assessing PA pressure. Left Atrium: Left atrial size was severely dilated. Right Atrium: There is a long serpiginous density next to the oriface of the IVC into the RA. Review of this area on prior CTA shows no evidcene of thrombus. This likely represents a prominent eustacion valve. Right atrial size was normal in size. Pericardium: There is no evidence of pericardial effusion. Mitral Valve: The mitral valve is normal in structure. Mild to moderate mitral annular calcification. Trivial mitral valve regurgitation. No evidence of mitral valve stenosis. Tricuspid Valve: The tricuspid valve is normal in structure. Tricuspid valve regurgitation is not demonstrated. No evidence of tricuspid stenosis. Aortic Valve: The aortic valve is normal in structure. Aortic valve regurgitation is not visualized. No aortic stenosis is present. Aortic valve peak gradient measures 3.1 mmHg. Pulmonic Valve: The pulmonic valve was normal in structure. Pulmonic valve regurgitation is not visualized. No evidence of pulmonic stenosis. Aorta: The aortic root is normal in size and structure. Venous: The inferior vena cava is dilated in size with greater than 50% respiratory variability, suggesting right atrial pressure of 8 mmHg. IAS/Shunts: No atrial level shunt detected by color flow Doppler. Additional Comments: 3D was performed not requiring image post processing on an  independent workstation and was abnormal.  LEFT VENTRICLE PLAX 2D LVIDd:         5.00 cm         Diastology LVIDs:         3.80 cm         LV e' medial:    5.44 cm/s LV PW:         1.00 cm  LV E/e' medial:  20.0 LV IVS:        1.10 cm         LV e' lateral:   9.03 cm/s LVOT diam:     2.00 cm         LV E/e' lateral: 12.1 LVOT Area:     3.14 cm LV IVRT:       137 msec        2D Longitudinal                                Strain                                2D Strain GLS   -15.0 %                                (A4C):                                2D Strain GLS   -15.7 %                                (A3C):                                2D Strain GLS   -15.3 %                                (A2C):                                2D Strain GLS   -15.3 %                                Avg:                                 3D Volume EF                                LV 3D EF:    Left                                             ventricul                                             ar                                             ejection  fraction                                             by 3D                                             volume is                                             45 %.                                 3D Volume EF:                                3D EF:        45 %                                LV EDV:       162 ml                                LV ESV:       89 ml                                LV SV:        74 ml RIGHT VENTRICLE            IVC RV Basal diam:  4.50 cm    IVC diam: 2.30 cm RV Mid diam:    3.50 cm RV S prime:     8.59 cm/s  PULMONARY VEINS TAPSE (M-mode): 1.1 cm     Diastolic Velocity: 44.20 cm/s                            S/D Velocity:       0.70                            Systolic Velocity:  31.50 cm/s LEFT ATRIUM              Index         RIGHT ATRIUM           Index LA diam:        5.20 cm  2.78 cm/m    RA Area:      20.30 cm LA Vol (A2C):   193.0 ml 103.33 ml/m  RA Volume:   57.10 ml  30.57 ml/m LA Vol (A4C):   98.8 ml  52.89 ml/m LA Biplane Vol: 143.0 ml 76.56 ml/m  AORTIC VALVE AV Area (Vmax): 2.58 cm AV Vmax:        88.50 cm/s AV Peak Grad:   3.1 mmHg LVOT Vmax:      72.60  cm/s  AORTA Ao Root diam: 3.20 cm Ao Asc diam:  3.20 cm MITRAL VALVE MV Area (PHT): 4.31 cm     SHUNTS MV Decel Time: 176 msec     Systemic Diam: 2.00 cm MV E velocity: 109.00 cm/s MV A velocity: 30.50 cm/s MV E/A ratio:  3.57 Wilbert Bihari MD Electronically signed by Wilbert Bihari MD Signature Date/Time: 11/16/2024/3:52:00 PM    Final    CT Angio Chest PE W and/or Wo Contrast Result Date: 11/15/2024 EXAM: CTA of the Chest with contrast for PE 11/15/2024 07:49:11 PM TECHNIQUE: CTA of the chest was performed after the administration of 60 mL of iohexol  (OMNIPAQUE ) 350 MG/ML injection. Multiplanar reformatted images are provided for review. MIP images are provided for review. Automated exposure control, iterative reconstruction, and/or weight based adjustment of the mA/kV was utilized to reduce the radiation dose to as low as reasonably achievable. COMPARISON: None available. CLINICAL HISTORY: sob w/ + d dimer FINDINGS: PULMONARY ARTERIES: Pulmonary arteries are adequately opacified for evaluation. A faint nonocclusive filling defect is present in the posterior basilar segmental artery of the left lower lobe. No other definite emboli are present. Main pulmonary artery is normal in caliber. MEDIASTINUM: The heart and pericardium demonstrate no acute abnormality. Dense atherosclerotic calcifications are present in the coronary arteries. Atherosclerotic changes are present in the aortic arch and great vessel origins. 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 There is no acute abnormality of the thoracic aorta. LYMPH NODES: No mediastinal, hilar or axillary lymphadenopathy. LUNGS AND PLEURA: A moderate left pleural  effusion is present. A small right pleural effusion is present. Mild dependent atelectasis is present bilaterally. A 5 mm nodule in the left upper lobe is best visualized on image 49 of series 6. Punctate nodules are present in the left lower lobe. An 8 mm nodule is present within the superior segment of the right lower lobe on image 65 of series 7. No pneumothorax. UPPER ABDOMEN: Limited images of the upper abdomen are unremarkable. SOFT TISSUES AND BONES: No acute bone or soft tissue abnormality. IMPRESSION: 1. Faint nonocclusive filling defect in the posterior basilar segmental artery of the left lower lobe, consistent with a small pulmonary embolism. No other definite emboli are present. 2. 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 3. Moderate left pleural effusion and small right pleural effusion. 4. Pulmonary nodules including a 5 mm left upper lobe nodule and an 8 mm right lower lobe nodule. For the 8 mm solid nodule, recommend non-contrast chest CT at 36 months; if high risk for malignancy, consider PET/CT or tissue sampling per Fleischner Society Guidelines. 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 CLINICAL HISTORY: SOB. FINDINGS: LUNGS AND PLEURA: No focal pulmonary opacity. No pleural effusion. No pneumothorax. HEART AND MEDIASTINUM: No acute abnormality of the cardiac and mediastinal silhouettes. BONES AND SOFT TISSUES: No acute osseous abnormality. IMPRESSION: 1. No acute cardiopulmonary process. Electronically signed by: [name] Seip MD 11/15/2024 06:13 PM EST RP Workstation: [id number]    Labs:  CBC: Recent Labs    12/02/24 0346 12/03/24 0437 12/03/24 1429 12/04/24 0326  WBC 21.0* 16.6* 17.1* 12.8*  HGB 8.5* 8.0* 8.5* 8.8*  HCT 26.0* 24.3*  25.2* 26.8*  PLT 366 387 404* 272    COAGS: Recent Labs  11/25/24 0414 11/26/24 0440 11/27/24 0406 11/27/24 2341 12/01/24 0343 12/03/24 0437  INR  --   --   --   --  1.2 1.2  APTT 66* 38* 38* 50*  --   --     BMP: Recent Labs    12/01/24 0343 12/02/24 0346 12/03/24 0437 12/04/24 0326  NA 139 138 138 137  K 4.3 4.5 4.3 3.9  CL 98 98 100 99  CO2 27 21* 25 24  GLUCOSE 332* 384* 244* 281*  BUN 61* 73* 75* 75*  CALCIUM  8.8* 8.6* 8.5* 8.7*  CREATININE 3.17* 3.48* 3.45* 3.36*  GFRNONAA 20* 18* 18* 19*    LIVER FUNCTION TESTS: Recent Labs    11/29/24 0805 11/30/24 0938 12/01/24 0343 12/04/24 0326  BILITOT 0.8 0.8 0.8 0.7  AST 18 20 19  13*  ALT 74* 65* 52* 30  ALKPHOS 704* 678* 620* 528*  PROT 5.8* 6.0* 5.9* 5.7*  ALBUMIN  2.7* 2.8* 2.8* 2.7*    TUMOR MARKERS: No results for input(s): AFPTM, CEA, CA199, CHROMGRNA in the last 8760 hours.  Assessment and Plan: Pancreatic adenocarcinoma: Patient with recent diagnosis of pancreatic adenocarcinoma, followed by Oncology (Dr. Autumn). Per review of Oncology note on 12/23, patient is recommended for combination of gemcitabine and Abraxane chemotherapy, to be initiated in the outpatient setting, and in an attempt to shrink the tumor and improve possible surgical outcomes. Patient will also undergo multidisciplinary case review at tumor conference for management planning, as well as a referral to surgical oncology for evaluation of surgical candidacy. Inpatient Port-A-Cath placement was recommended since anticoagulation management would be difficult in the outpatient setting.  Patient will tentatively present for Port-A-Cath placement in IR today.  Patient has been NPO since midnight. He has only had sips with meds, and will be receiving moderate sedation for port placement. All labs and medications are within acceptable parameters.  Allergies reviewed: Empagliflozin ; amlodipine ; amitriptyline ; lisinopril ;  nortriptyline ; simvastatin .   Risks and benefits of image guided port-a-catheter placement was discussed with the patient including, but not limited to bleeding, infection, pneumothorax, or fibrin sheath development and need for additional procedures.  All of the patient's questions were answered, patient is agreeable to proceed. Consent signed and in IR (BB).   2. Biliary obstruction s/p cholecystostomy tube placement.  Drain Location: RUQ Size: Fr size: 10 Fr Date of placement: 11/24/24  Currently to: Drain collection device: gravity 24 hour output:  Output by Drain (mL) 12/02/24 0701 - 12/02/24 1900 12/02/24 1901 - 12/03/24 0700 12/03/24 0701 - 12/03/24 1900 12/03/24 1901 - 12/04/24 0700 12/04/24 0701 - 12/04/24 1123  Biliary Tube Cholecystostomy 10 Fr. RUQ 20 300 120 180     Interval imaging/drain manipulation:  DG Abd 1 view on 12/20  Current examination: Flushes easily per RN report. Insertion site unremarkable. Suture and stat lock in place. Dressed appropriately.   Plan: Continue TID flushes with 5 cc NS. Record output Q shift. Dressing changes QD or PRN if soiled.  Call IR APP or on call IR MD if difficulty flushing or sudden change in drain output.    Discharge planning: Please contact IR APP or on call IR MD prior to patient d/c to ensure appropriate follow up plans are in place. IR scheduler will contact patient with date/time of appointment. Patient will need to flush drain QD with 5 cc NS, record output QD, dressing changes every 2-3 days or earlier if soiled.    Percutaneous cholecystostomy drain to remain in place at least 6  weeks.  Recommend fluoroscopy with injection of the drain in IR to evaluate for patency of the cystic duct. If the duct is patent and general surgery feels patient is stable for cholecystectomy, the drain would be removed at time of surgery. If the duct is patent and general surgery feels patient is NEVER a candidate for cholecystectomy,  drain can be capped for a trial. If symptoms recur, then place to gravity bag again. If trial is successful, discuss possible removal of the drain. If trial in unsuccessful, then patient will need routine exchanges of the chole tube about every 8-10 weeks.   Please call the IR PA at [phone] when patient is about to be discharged and we will arrange the follow up drain injection (ok to leave message).   IR will continue to follow - please call with questions or concerns.    Thank you for allowing our service to participate in Colin Keller 's care.  Electronically Signed: Carlin DELENA Griffon, PA-C   12/04/2024, 10:53 AM      I spent a total of 40 Minutes in face to face in clinical consultation, greater than 50% of which was counseling/coordinating care for Port-A-Cath placement in assistance with chemotherapy administration for pancreatic adenocarcinoma.   "

## 2024-12-04 NOTE — Assessment & Plan Note (Addendum)
 Pancreatic adenocarcinoma following with Heme-Onc.  Likely also constipated.  1 BM 12/23 after suppository, first in multiple days. S/p additional 1 glycerin  suppository overnight.  Pain poorly controlled. - Continue lactulose  BID today given NPO, can increase to TID moving forward as needed - Port-a-Cath consult to IR for placement per Heme-Onc to manage anticoagulation given difficult access outpatient - Linked order: oxycodone  5 mg Q6h PRN moderate pain OR oxycodone  10 mg Q6h PRN for severe pain - KUB x1 given abdominal distension

## 2024-12-04 NOTE — Progress Notes (Signed)
 Physical Therapy Treatment Patient Details Name: Colin Keller MRN: [id number] DOB: 03/16/1956 Today's Date: 12/04/2024   History of Present Illness 68 y/o Male admitted 12/9 with RUQ abdominal pain, acute PE started heparin , AKI PMH DM2, chronic AKI, CAD CHF HTN,CABG, PVD, recent d/c on 12/7 for posterior basilar segmental artery PE in the left lower lobe, discharged home with Eliquis . Post -op ERCP 12/12.    PT Comments  Pt finishing up working with OT, up in recliner. Pt reluctant to go for a walk with PT, however OT and RN both in the room and provided encouragement. Pt reports he knows better then to argue with 3 women Pt able to ambulate 150 feet in hallway with 1x standing rest break. Pt is impressed with his progress and reports he was glad he went at the end of the session. D/c plan remains appropriate at this time. PT will continue to follow acutely.     If plan is discharge home, recommend the following: Assistance with cooking/housework;Assist for transportation;A little help with bathing/dressing/bathroom   Can travel by private vehicle      Yes  Equipment Recommendations  None recommended by PT       Precautions / Restrictions Precautions Precautions: None Recall of Precautions/Restrictions: Intact Precaution/Restrictions Comments: RUQ drain Restrictions Weight Bearing Restrictions Per Provider Order: No     Mobility  Bed Mobility               General bed mobility comments: up in recliner after working with OT    Transfers Overall transfer level: Needs assistance Equipment used: None Transfers: Sit to/from Stand, Bed to chair/wheelchair/BSC Sit to Stand: Contact guard assist           General transfer comment: verbal cues for hand placement for power up to standing good power up and self steady    Ambulation/Gait Ambulation/Gait assistance: Contact guard assist, Min assist Gait Distance (Feet): 150 Feet Assistive device: Rolling walker (2  wheels) Gait Pattern/deviations: Step-through pattern, Decreased stride length, Drifts right/left       General Gait Details: pt with slowed steady gait, vc for proximity to RW especially in tight spaces. Pt impressed with how far he has gone.      Balance Overall balance assessment: Mild deficits observed, not formally tested Sitting-balance support: Feet supported Sitting balance-Leahy Scale: Good     Standing balance support: No upper extremity supported Standing balance-Leahy Scale: Fair                              Hotel Manager: No apparent difficulties  Cognition Arousal: Alert Behavior During Therapy: WFL for tasks assessed/performed, Flat affect                           PT - Cognition Comments: pt reports awareness of needing to walk with therapy and with encouragement is agreeable Following commands: Intact      Cueing Cueing Techniques: Verbal cues     General Comments General comments (skin integrity, edema, etc.): max noted HR 132 bpm with ambulation, pt impressed by how far he was able to go      Pertinent Vitals/Pain Pain Assessment Pain Assessment: Faces Faces Pain Scale: Hurts a little bit Pain Location: back Pain Descriptors / Indicators: Discomfort Pain Intervention(s): Limited activity within patient's tolerance, Monitored during session, Premedicated before session     PT Goals (current goals can now  be found in the care plan section) Acute Rehab PT Goals PT Goal Formulation: With patient Time For Goal Achievement: 12/05/24 Progress towards PT goals: Progressing toward goals    Frequency    Min 2X/week       AM-PAC PT 6 Clicks Mobility   Outcome Measure  Help needed turning from your back to your side while in a flat bed without using bedrails?: None Help needed moving from lying on your back to sitting on the side of a flat bed without using bedrails?: None Help needed moving  to and from a bed to a chair (including a wheelchair)?: A Little Help needed standing up from a chair using your arms (e.g., wheelchair or bedside chair)?: A Little Help needed to walk in hospital room?: A Little Help needed climbing 3-5 steps with a railing? : A Little 6 Click Score: 20    End of Session Equipment Utilized During Treatment: Gait belt Activity Tolerance: Patient tolerated treatment well Patient left: with call bell/phone within reach;in chair Nurse Communication: Mobility status PT Visit Diagnosis: Muscle weakness (generalized) (M62.81)     Time: 8671-8657 PT Time Calculation (min) (ACUTE ONLY): 14 min  Charges:    $Gait Training: 8-22 mins PT General Charges $$ ACUTE PT VISIT: 1 Visit                     Brnadon Eoff B. Fleeta Lapidus PT, DPT Acute Rehabilitation Services Please use secure chat or  Call Office [phone]    Almarie KATHEE Fleeta Fleet 12/04/2024, 2:21 PM

## 2024-12-04 NOTE — Assessment & Plan Note (Addendum)
 CBGs elevated past 24 hours, but previously labile. Pt again NPO this AM. Caution with excess LAI.  - Increase to 15 units LAI TOMORROW 12/25 once patient no longer NPO - Continue sensitive SSI - Hold home metformin 

## 2024-12-04 NOTE — Assessment & Plan Note (Signed)
 BPH: Continue home tamsulosin  GERD: Continue home Protonix 

## 2024-12-04 NOTE — Assessment & Plan Note (Addendum)
 Leukocytosis improving.  Alk phosph gradually downtrending.  Afebrile, VSS.  Perc chole tube in place and draining. - Continue pantoprazole  40 mg BID - Continue carafate  1g BID - GI and Gen Surg consulted, appreciate recs - Cholecystostomy tube in place. Appreciate IR recs. - AM CMP

## 2024-12-04 NOTE — Progress Notes (Signed)
 Nutrition Follow-up  DOCUMENTATION CODES:   Non-severe (moderate) malnutrition in context of chronic illness  INTERVENTION:   -Once diet is resumed:   -Continue regular diet for widest variety of meal selections -D/c Boost Breeze po TID, each supplement provides 250 kcal and 9 grams of protein  -Ensure Plus High Protein po BID, each supplement provides 350 kcal and 20 grams of protein  -Magic cup TID with meals, each supplement provides 290 kcal and 9 grams of protein  -MVI with minerals daily  -Reached out to [facility] RDs to make them aware of patient once he is discharged for further support  -Reached out to DM coordinators to discuss insulin  needs due to hyperglycemia   NUTRITION DIAGNOSIS:   Moderate Malnutrition related to chronic illness as evidenced by severe muscle depletion, moderate fat depletion.  Ongoing  GOAL:   Patient will meet greater than or equal to 90% of their needs  Progressing   MONITOR:   PO intake, Supplement acceptance, Diet advancement, Labs, Weight trends  REASON FOR ASSESSMENT:   Malnutrition Screening Tool, NPO/Clear Liquid Diet    ASSESSMENT:   Patient presented with jaundice and worsening abdominal pain without decrease in appetite and was found to have biliary obstruction with sepsis and AKI. PMH significant for recent admission with pulmonary embolism discharged 11/17/24, DM2, CKD3, CHF, CAD s/p CABG, HTN, dyslipidemia, PVD, chronic back pain, and GERD.  12/09 MRCP 12/12 ERCP with biliary sphincterotomy and CBD stent, Rapid Response required for unresponsiveness post-operatively. 12/14 cholecystostomy tube placement 12/16 failed EUS due to stomach full of food 12/18 s/p EUS- revealed a masslike region was identified in the pancreatic head where the CBD stent courses, Cytology results are pending (suspicious for adenocarcinoma), biopsy performed, one stent was visualized endosonographically in the common bile duct, No  malignant-appearing lymph nodes were visualized in the celiac region (level 20) and peripancreatic region, Perihepatic ascites noted; advanced to full liquid diet 12/22- renal biopsy held secondary to EKG showing ischemic changes. Per MD- pancreatic biopsy revealed adenocarcinoma  12/23- patient and family would like to hold off on renal biopsy at this time, advanced to regular diet  Reviewed I/O's: +442 ml x 24 hours and -2 L since 11/20/24  Biliary output: 300 ml x 24 hours  Patient unavailable at time of visit. Attempted to speak with patient via call to hospital room phone, however, unable to reach. RD unable to obtain further nutrition-related history or complete nutrition-focused physical exam at this time.    Case discussed with RN, who confirmed that patient is currently NPO for port a cath placement today. She is unsure how he was doing nutritionally prior to today. Per MD notes, patient is eager to eat, but aware of rationale for NPO status. Patient has not had a consistent diet over the past few days due to being NPO for multiple tests/ procedures. He also complains of back pain, constipation and bloating. KUB has been ordered due to abdominal distention.He has only had 1 BM in the past few days with help of a suppository.  Variable meal completion data available; PO 10-100%. Patient has also had some nausea and vomiting since 11/30/24; placed on amiodarone  due to a-fib with RVR.   Per prior RD note, patient does not like Boost Breeze supplements.   Per oncology notes, patient with pancreatic cancer (likely early stage). Surgical resection may be feasible pending further staging and assessment of vascular involvement and distant metastasis. Per GI, can plan for ERCP stent exchange in 3-4  months if patient is not deemed a surgical candidate. Plan to start chemotherapy on the week of 12/17/2024 (likely gemcitabine and Abraxane chemotherapy, to be initiated in the outpatient setting to attempt  tumor shrinkage and improve surgical outcomes).   Per chart review, patient had a hypoglycemic events on 11/28/24, which is suspected to be related to NPO and on insulin . DM coordinator made recommendations on 11/29/24 to modify insulin  regimen (lantus  was discontinued). Recommendations to start 12 units of lantus  daily noted on 12/22/5 secondary to persistent hyperglycemia.   Spiritual care following for further support to patient and family.   Medications reviewed and include lactulose , MVI, protonix , senokot, miralax , and amiodarone .   Lab Results  Component Value Date   HGBA1C 9.6 (H) 11/15/2024   PTA DM medications are 1000 mg metformin  BID, 20 units insulin  glargine daily, and 3-5 units insulin  lispro TID.    Labs reviewed: Mg: 2.7, CBGS: 231-342 (inpatient orders for glycemic control are 0-9 units insulin  aspart TID with meals and 10 units insulin  glargine daily).    Diet Order:   Diet Order             Diet NPO time specified Except for: Sips with Meds  Diet effective now                   EDUCATION NEEDS:   Education needs have been addressed  Skin:  Skin Assessment: Reviewed RN Assessment  Last BM:  12/04/24 (type 4)  Height:   Ht Readings from Last 1 Encounters:  11/28/24 5' 10 (1.778 m)    Weight:   Wt Readings from Last 1 Encounters:  12/04/24 60.2 kg    Ideal Body Weight:  75.5 kg  BMI:  Body mass index is 19.04 kg/m.  Estimated Nutritional Needs:   Kcal:  1900-2200  Protein:  100-120  Fluid:  >/=1900    Margery ORN, RD, LDN, CDCES Registered Dietitian III Certified Diabetes Care and Education Specialist If unable to reach this RD, please use RD Inpatient group chat on secure chat between hours of 8am-4 pm daily

## 2024-12-04 NOTE — Procedures (Signed)
 Vascular and Interventional Radiology Procedure Note  Patient: Colin Keller DOB: 08-09-1956 Medical Record Number: [id number] Note Date/Time: 12/04/2024 10:43 AM   Performing Physician: Thom Hall, MD Assistant(s): None  Diagnosis: Pancreatic cancer  Procedure: PORT PLACEMENT  Anesthesia: Conscious Sedation Complications: None Estimated Blood Loss: Minimal  Findings:  Successful right-sided port placement, with the tip of the catheter in the proximal right atrium.  Plan: Catheter ready for use.  See detailed procedure note with images in PACS. The patient tolerated the procedure well without incident or complication and was returned to Recovery in stable condition.    Thom Hall, MD [facility] Radiology   Pager. [phone] Clinic. [phone]

## 2024-12-04 NOTE — Progress Notes (Signed)
 Occupational Therapy Treatment Patient Details Name: Colin Keller MRN: [id number] DOB: 1956/09/08 Today's Date: 12/04/2024   History of present illness 68 y/o Male admitted 12/9 with RUQ abdominal pain, acute PE started heparin , AKI PMH DM2, chronic AKI, CAD CHF HTN,CABG, PVD, recent d/c on 12/7 for posterior basilar segmental artery PE in the left lower lobe, discharged home with Eliquis . Post -op ERCP 12/12.   OT comments  Pt progressing toward goals this session, able to stand at sink x10 min for grooming tasks with up to CGA before needing a seated rest break. Pt mod I for bed mobility and CGA for transfers with RW. Pt presenting with impairments listed below, will follow acutely. Continue to recommend HHOT at d/c.       If plan is discharge home, recommend the following:  A little help with walking and/or transfers;A little help with bathing/dressing/bathroom;Help with stairs or ramp for entrance;Assistance with cooking/housework;Assist for transportation   Equipment Recommendations  Other (comment) (RW)    Recommendations for Other Services PT consult    Precautions / Restrictions Precautions Precautions: None Recall of Precautions/Restrictions: Intact Precaution/Restrictions Comments: RUQ drain Restrictions Weight Bearing Restrictions Per Provider Order: No       Mobility Bed Mobility Overal bed mobility: Modified Independent                  Transfers Overall transfer level: Needs assistance Equipment used: Rolling walker (2 wheels) Transfers: Sit to/from Stand Sit to Stand: Contact guard assist                 Balance Overall balance assessment: Needs assistance Sitting-balance support: Feet supported Sitting balance-Leahy Scale: Good     Standing balance support: No upper extremity supported Standing balance-Leahy Scale: Fair                             ADL either performed or assessed with clinical judgement   ADL        Grooming: Oral care;Wash/dry face;Set up;Standing                   Toilet Transfer: Contact guard assist;Ambulation;Rolling walker (2 wheels);Regular Teacher, Adult Education Details (indicate cue type and reason): simulated via functional mobility         Functional mobility during ADLs: Contact guard assist;Rolling walker (2 wheels)      Extremity/Trunk Assessment Upper Extremity Assessment Upper Extremity Assessment: Generalized weakness   Lower Extremity Assessment Lower Extremity Assessment: Defer to PT evaluation        Vision   Vision Assessment?: No apparent visual deficits   Perception Perception Perception: Not tested   Praxis Praxis Praxis: Not tested   Communication Communication Communication: No apparent difficulties   Cognition Arousal: Alert Behavior During Therapy: WFL for tasks assessed/performed, Flat affect Cognition: No apparent impairments                               Following commands: Intact        Cueing   Cueing Techniques: Verbal cues  Exercises      Shoulder Instructions       General Comments VSS on RA    Pertinent Vitals/ Pain       Pain Assessment Pain Assessment: No/denies pain  Home Living  Prior Functioning/Environment              Frequency  Min 2X/week        Progress Toward Goals  OT Goals(current goals can now be found in the care plan section)  Progress towards OT goals: Progressing toward goals     Plan      Co-evaluation                 AM-PAC OT 6 Clicks Daily Activity     Outcome Measure   Help from another person eating meals?: None Help from another person taking care of personal grooming?: A Little Help from another person toileting, which includes using toliet, bedpan, or urinal?: A Little Help from another person bathing (including washing, rinsing, drying)?: A Little Help from another  person to put on and taking off regular upper body clothing?: A Little Help from another person to put on and taking off regular lower body clothing?: A Little 6 Click Score: 19    End of Session Equipment Utilized During Treatment: Gait belt  OT Visit Diagnosis: Muscle weakness (generalized) (M62.81)   Activity Tolerance Patient tolerated treatment well   Patient Left in chair;with call bell/phone within reach;with nursing/sitter in room   Nurse Communication Mobility status        Time: 8681-8660 OT Time Calculation (min): 21 min  Charges: OT General Charges $OT Visit: 1 Visit OT Treatments $Self Care/Home Management : 8-22 mins  Azka Steger K, OTD, OTR/L SecureChat Preferred Acute Rehab [phone]   Mikhael Hendriks K Koonce 12/04/2024, 1:50 PM

## 2024-12-04 NOTE — Progress Notes (Signed)
 "    Daily Progress Note Intern Pager: [phone]  Patient name: Colin Keller Medical record number: [id number] Date of birth: [birth year] Age: 68 y.o. Gender: male  Primary Care Provider: McDiarmid, Krystal BIRCH, MD Consultants: Nephro, Cardiology, IR, Gen Surg, GI Code Status: Full  Pt Overview and Major Events to Date:  12/9 - Admitted 12/12 - ERCP, transfer to ICU for persistent bradycardia 12/14 - Return to floor from ICU, IR and Nephro consulted, IR placed cholecystostomy tube  12/17 - New Afib RvR 12/18 - EUS, pancreatic biopsies confirmed adenocarcinoma   Medical Decision Making Colin Keller is 68 y.o. who presented with abdominal pain found to have common bile stricture and acute cholecystitis treated with percutaneous cholecystostomy tube placement, subsequent EUS pancreatic biopsies confirmed pancreatic adenocarcinoma a patient is now following with heme-onc.  As of yesterday, the patient has deferred kidney biopsy to outpatient setting given his ongoing cancer workup and diagnosis, which takes precedence at this point for him.  Nephrology is agreeable with this plan given patient's stable creatinine.  Heme-onc does recommend placing Port-A-Cath to assist with anticoagulation management outpatient.  Will pursue this path today.  Patient does continue to report significant back pain, will adjust pain regimen to offer linked oxy order for mod vs severe pain level.  Also obtain KUB given abdominal distension and constipation. Assessment & Plan Pancreatic adenocarcinoma (HCC) Multiple lung nodules on CT Abdominal pain Pancreatic adenocarcinoma following with Heme-Onc.  Likely also constipated.  1 BM 12/23 after suppository, first in multiple days. S/p additional 1 glycerin  suppository overnight.  Pain poorly controlled. - Continue lactulose  BID today given NPO, can increase to TID moving forward as needed - Port-a-Cath consult to IR for placement per Heme-Onc to manage anticoagulation  given difficult access outpatient - Linked order: oxycodone  5 mg Q6h PRN moderate pain OR oxycodone  10 mg Q6h PRN for severe pain - KUB x1 given abdominal distension Biliary stricture (HCC) Biliary sepsis (HCC) Cholecystitis Leukocytosis improving.  Alk phosph gradually downtrending.  Afebrile, VSS.  Perc chole tube in place and draining. - Continue pantoprazole  40 mg BID - Continue carafate  1g BID - GI and Gen Surg consulted, appreciate recs - Cholecystostomy tube in place. Appreciate IR recs. - AM CMP Anemia Acute pulmonary embolism (HCC) Hgb stabilized. No clinical signs of bleeding. - Heparin  gtt - CBC daily, transfusion threshold Hgb <8.0 AKI (acute kidney injury) Cr stable but elevated. Adequate UOP.  After extensive discussion with family and nephrology yesterday, decision was reached not to pursue a kidney biopsy at this time due to new overwhelming cancer diagnosis.  Nephrology agrees given creatinine stability it is reasonable to defer biopsy to outpatient setting. - Nephro signed off, appreciate recommendations, outpatient follow-up after discharge - Holding hydrochlorothiazide  - AM CMP, Mg Coronary artery disease involving native coronary artery of native heart without angina pectoris Atrial fibrillation with RVR (HCC) HTN (hypertension) Rate controlled at the moment. Asymptomatic this AM. Trops flat yesterday. BP WNL last ~12 hours.  - Continue amiodarone  drip - Continue imdur  60mg  daily - Continue hydralazine  50mg  PO q8h once taking PO - Continue carvedilol  6.25mg  BID w/ meals - Cards following, appreciate their recs:  - Considering transition to oral amio + DOAC after port placement Eye pain, left - Continue tetracaine  0.5% ophthalmic anesthetic eye drops - Continue protective eye patch T2DM (type 2 diabetes mellitus) (HCC) CBGs elevated past 24 hours, but previously labile. Pt again NPO this AM. Caution with excess LAI.  - Increase to 15  units LAI TOMORROW 12/25  once patient no longer NPO - Continue sensitive SSI - Hold home metformin  Chronic health problem BPH: Continue home tamsulosin  GERD: Continue home Protonix   FEN/GI: NPO for procedure this AM, then full liquid PPx: Heparin , holding for procedure Dispo: Pending clinical improvement and depending on goals of care. Barriers include Port-A-Cath placement, anemia, acute cholecystitis.  Subjective:  Overnight, the patient experienced mild abdominal pain with some distention and minimal TTP.  Suspect in setting of pancreatic adenocarcinoma, with moderate stool burden on prior imaging.  Has only had 1 BM in several days yesterday after 1 glycerin  suppository.  Additional glycerin  suppository was administered overnight.  This AM, patient reports that he is hungry and he would like to eat some applesauce or some grits, has not had a consistent diet the past few days.  Does report continuing constipation and bloating in his abdomen, notes significant back pain that is still not fully controlled with current pain regiment.  Objective: Temp:  [97.5 F (36.4 C)-98.5 F (36.9 C)] 97.5 F (36.4 C) (12/24 0409) Pulse Rate:  [84-111] 100 (12/24 0659) Resp:  [12-20] 13 (12/24 0659) BP: (122-170)/(71-97) 170/97 (12/24 0600) SpO2:  [96 %-98 %] 98 % (12/24 0409) Weight:  [60.2 kg] 60.2 kg (12/24 0409)  Physical Exam: General: Age-appropriate, resting in mild discomfort in bed, alert and at baseline. Cardiovascular: Regular rate and rhythm. Normal S1/S2. No murmurs, rubs, or gallops appreciated. 2+ radial pulses. Pulmonary: Clear bilaterally to auscultation; no wheezes, crackles, or rhonchi. Normal WOB on room air, no accessory muscle usage. Abdominal: Distended abdomen.  Percutaneous chole tube in place and draining, nontender to palpation.  Normoactive bowel sounds, nondistended. No tenderness to deep or light palpation. No rebound or guarding. No HSM. Skin: Warm and dry. Extremities: No peripheral edema  bilaterally. L leg cooler than R, but with palpable DP and PT pulses.  Capillary refill <2 seconds.  Laboratory: Most recent CBC Lab Results  Component Value Date   WBC 12.8 (H) 12/04/2024   HGB 8.8 (L) 12/04/2024   HCT 26.8 (L) 12/04/2024   MCV 95.0 12/04/2024   PLT 272 12/04/2024   Most recent BMP    Latest Ref Rng & Units 12/04/2024    3:26 AM  BMP  Glucose 70 - 99 mg/dL 718   BUN 8 - 23 mg/dL 75   Creatinine 9.38 - 1.24 mg/dL 6.63   Sodium 864 - 854 mmol/L 137   Potassium 3.5 - 5.1 mmol/L 3.9   Chloride 98 - 111 mmol/L 99   CO2 22 - 32 mmol/L 24   Calcium  8.9 - 10.3 mg/dL 8.7     Other pertinent labs: - CEA, CA 19-9 in process  New Imaging/Diagnostic Tests: - None  Kwesi Sangha, MD 12/04/2024, 7:25 AM  PGY-2, [facility] FPTS Intern pager: [phone], text pages welcome Secure chat group [facility] Teaching Service   "

## 2024-12-04 NOTE — Assessment & Plan Note (Signed)
 Hgb stabilized. No clinical signs of bleeding. - Heparin  gtt - CBC daily, transfusion threshold Hgb <8.0

## 2024-12-04 NOTE — Assessment & Plan Note (Signed)
-   Continue tetracaine  0.5% ophthalmic anesthetic eye drops - Continue protective eye patch

## 2024-12-04 NOTE — Inpatient Diabetes Management (Signed)
 Inpatient Diabetes Program Recommendations  AACE/ADA: New Consensus Statement on Inpatient Glycemic Control (2015)  Target Ranges:  Prepandial:   less than 140 mg/dL      Peak postprandial:   less than 180 mg/dL (1-2 hours)      Critically ill patients:  140 - 180 mg/dL   Lab Results  Component Value Date   GLUCAP 342 (H) 12/04/2024   HGBA1C 9.6 (H) 11/15/2024    Review of Glycemic Control  Latest Reference Range & Units 12/01/24 21:15 12/02/24 06:13 12/02/24 11:06 12/02/24 16:18 12/02/24 20:59 12/03/24 06:01 12/03/24 11:43 12/03/24 16:23 12/03/24 21:26 12/04/24 07:04  Glucose-Capillary 70 - 99 mg/dL 768 (H) 609 (H) 627 (H) 272 (H) 119 (H) 247 (H) 291 (H) 250 (H) 231 (H) 342 (H)  (H): Data is abnormally high  Outpatient Diabetes medications:   Basaglar  10-12 units every day Metformin  1000 mg BID Humalog  3-5 units TID    Current orders for Inpatient glycemic control:  Novolog  0-9 units TID, Lantus  10 units QD   Inpatient Diabetes Program Recommendations:     Consider increasing Lantus  12 units daily.   Would not resume Metformin  at discharge.  Secure chat sent to MD.   Thanks, Tinnie Minus, MSN, RNC-OB Diabetes Coordinator [phone] (8a-5p)

## 2024-12-04 NOTE — Assessment & Plan Note (Addendum)
 Rate controlled at the moment. Asymptomatic this AM. Trops flat yesterday. BP WNL last ~12 hours.  - Continue amiodarone  drip - Continue imdur  60mg  daily - Continue hydralazine  50mg  PO q8h once taking PO - Continue carvedilol  6.25mg  BID w/ meals - Cards following, appreciate their recs:  - Considering transition to oral amio + DOAC after port placement

## 2024-12-04 NOTE — Plan of Care (Addendum)
 1640 Noted patient CBG 54>47.  Patient last received 2 units SAI at 1300 and 10 units LAI at 0800. Went to see patient at bedside, upon my arrival RN was providing patient with glucose gel, as he did not tolerate orange juice. Patient denied hypoglycemia symptoms.  Stated he is feeling well. Patient is now no longer NPO, advised him that he has a regular diet if desired, can order applesauce or grits as he wanted to this morning.  Additionally, patient's wife was on the phone speaking to the RN.  I spoke with wife afterwards. Patient's wife expressed concern about the patient's abdominal distention and constipation. Reviewed KUB results from today, no significant obstruction, bowel gas, mild stool burden. Wife inquired if he would be doing enema. Do not see medical indication for enema at this time, advised wife of this. Abdominal physical exam remains overall reassuring, with mild distention but no guarding or rebound tenderness.  Mild generalized TTP is unchanged from prior. - Additional glycerin  suppository this p.m. - Continue lactulose  10 g twice daily and senna twice daily - Consider enema in 1 to 2 days if still bloated and constipated  1755 Repeat CBG 61, second glucose gel administered, patient still asymptomatic. - Recheck CBG in 30 minutes  1830 Repeat CBG 56.  Still asymptomatic but concerningly persistent hypoglycemia with severe hypoglycemia earlier this admission and very poor PO past few days.  Considered D5NS, but given HFrEF not comfortable with giving significant fluids.  Expect significant effect from D50, will monitor closely. - D50 amp ordered - Recheck CBG in 30 minutes   Mcdaniel Ohms Toma, MD PGY-2, [facility] 12/04/2024

## 2024-12-04 NOTE — Progress Notes (Signed)
 "  Progress Note  Patient Name: Colin Keller Date of Encounter: 12/04/2024 [facility] Cardiologist: Redell Shallow, MD   Interval Summary    Patient did not want to pursue kidney biopsy at this point. Plan for port-a-cath insertion for chemotherapy. Heparin  held at this time. Hgb 8-9. Stil on IV amiodarone  for Afib, rates mostly in the 90s. He reports chest discomfort referred from stomach issues (constipation).  Vital Signs Vitals:   12/04/24 0409 12/04/24 0600 12/04/24 0659 12/04/24 0732  BP: 132/87 (!) 170/97  (!) 142/74  Pulse: (!) 102 96 100 95  Resp: 17 12 13 13   Temp: (!) 97.5 F (36.4 C)   (!) 97.4 F (36.3 C)  TempSrc: Oral   Oral  SpO2: 98%   100%  Weight: 60.2 kg     Height:        Intake/Output Summary (Last 24 hours) at 12/04/2024 0914 Last data filed at 12/04/2024 0447 Gross per 24 hour  Intake 741.94 ml  Output 300 ml  Net 441.94 ml      12/04/2024    4:09 AM 12/03/2024    5:00 AM 12/02/2024    2:31 AM  Last 3 Weights  Weight (lbs) 132 lb 11.5 oz 143 lb 8.3 oz 151 lb 0.2 oz  Weight (kg) 60.2 kg 65.1 kg 68.5 kg      Telemetry/ECG  Afib HR 90s - Personally Reviewed  Physical Exam  GEN: No acute distress.   Neck: + JVD Cardiac: Irreg Irreg, no murmurs, rubs, or gallops.  Respiratory: Clear to auscultation bilaterally. GI: Soft, nontender, non-distended  MS: mild pedal edema  Echo 11/27/24: 1. Left ventricular ejection fraction, by estimation, is 40 to 45%. The  left ventricle has mildly decreased function. The left ventricle  demonstrates global hypokinesis. There is mild concentric left ventricular  hypertrophy. Left ventricular diastolic  function could not be evaluated.   2. Right ventricular systolic function is moderately reduced.   3. Left atrial size was moderately dilated.   4. The mitral valve is degenerative. Trivial mitral valve regurgitation.  Severe mitral annular calcification.   5. The aortic valve is tricuspid.  There is mild calcification of the  aortic valve. Aortic valve sclerosis/calcification is present, without any  evidence of aortic stenosis.    Patient Profile    68 y.o. male with a history of CAD s/p CABG (2019), PAD s/p left SFA angioplasty (2023), CKDIV, carotid artery disease who presented biliary sepsis s/p ERCP stenting (12/12) and cholecystostomy (12/14) and hospitalization c/b PE (12/5), new onset Afib with RVR (12/17) and anemia requiring 1 unit of pRBCs while on Eliquis  (12/17). Repeat TTE 12/17 with LVEF 40-45% (from 50-55% 12/13) in setting of Afib with RVR. He was started on amio gtt on 12/20 given inability to tolerate PO intake and had a brief interrupt in heparin  gtt at that time. He converted to NSR on 12/21. Hydralazine  was held on 12/20.    Converted back to Afib RVR 12/02/24 at ~0815 with abnormal EKG with ST changes, no chest pain. Renal biopsy scheduled 12/02/24 canceled due to RVR and HTN. Heparin  gtt restarted. Amiodarone  gtt restarted.  Assessment & Plan   Afib RVR - initially converted on IV amiodarone , discontinued 12/22 with plans for oral cardizem  - back in afib 12/22, he remains in Afib with HR 90-110 - IV heparin  held for renal biopsy, which was ultimately canceled for Afib RVR and IV amio was restarted. Patient is not wanting to pursue renal biopsy  at this time. Plan for port-a-cath placement for chemotherapy - continue IV amiodarone  for now - Coreg  6.25mg BID>will increase for better rate control and BP - transition IV heparin  to Eliquis  once procedures are done - continue to monitor tele  EKG changes CAD s/p CABG 2019 - in the setting of Afib RVR- ST depression in inferior and lateral leads - HS trop mild and flat, likely due to RVR and not ACS - repeat EKG showed better rate control - he reports chest pain from stomach issues (constipation and indigestion)  Acute on chronic renal insufficiency 3-4 - baseline Scr 1.9-2.2 - Scr 2.92>3.48>3.45>3.36 - AKI  suspected prerenal, possible AIN - plan for biopsy, however family wanted to defer  HTN - Coreg  - hydralazine  - Imdur  - dilt held  Acute on chronic systolic heart failure - LVEF 59-54%, moderately reduced RV function, severe MAC - GDMT with Hydral, Imdur , and Coreg  - CKD limiting GDMT - mild edema on exam with JVD - diuresis per nephrology. Kidney function seems to be improving  PE - diagnosed 11/15/24, was placed on OAC - IV heparin  in place of ELiquis   Pancreatic adenocarcinoma - oncology is following, pan for chemotherapy     For questions or updates, please contact [facility] Please consult [email or web address] for contact info under         Signed, [name], PA-C   "

## 2024-12-04 NOTE — Plan of Care (Signed)

## 2024-12-04 NOTE — Assessment & Plan Note (Addendum)
 Cr stable but elevated. Adequate UOP.  After extensive discussion with family and nephrology yesterday, decision was reached not to pursue a kidney biopsy at this time due to new overwhelming cancer diagnosis.  Nephrology agrees given creatinine stability it is reasonable to defer biopsy to outpatient setting. - Nephro signed off, appreciate recommendations, outpatient follow-up after discharge - Holding hydrochlorothiazide  - AM CMP, Mg

## 2024-12-04 NOTE — Progress Notes (Signed)
 PHARMACY - ANTICOAGULATION CONSULT NOTE  Pharmacy Consult for IV heparin  Indication: pulmonary embolus (11/15/24)  Allergies[1]  Patient Measurements: Height: 5' 10 (177.8 cm) Weight: 60.2 kg (132 lb 11.5 oz) IBW/kg (Calculated) : 73 HEPARIN  DW (KG): 67.1  Vital Signs: Temp: 97.4 F (36.3 C) (12/24 0732) Temp Source: Oral (12/24 0732) BP: 116/63 (12/24 1215) Pulse Rate: 89 (12/24 1215)  Labs: Recent Labs    12/02/24 0346 12/02/24 1855 12/03/24 0437 12/03/24 1429 12/03/24 1813 12/04/24 0326  HGB 8.5*  --  8.0* 8.5*  --  8.8*  HCT 26.0*  --  24.3* 25.2*  --  26.8*  PLT 366  --  387 404*  --  272  LABPROT  --   --  16.3*  --   --   --   INR  --   --  1.2  --   --   --   HEPARINUNFRC 0.47   < > 0.22*  --  0.33 0.33  CREATININE 3.48*  --  3.45*  --   --  3.36*   < > = values in this interval not displayed.    Estimated Creatinine Clearance: 17.4 mL/min (A) (by C-G formula based on SCr of 3.36 mg/dL (H)).  Assessment: Colin Keller is a 68 y.o. male admitted on 11/19/2024 with concern for cholecystitis s/p ERCP w/ stenting on 12/12 and percutaneous cholecystostomy by IR on 12/14. Recently diagnosed with new PE (11/15/24) and started on eliquis . Last dose eliquis  prior to admission 12/8 @ 9:30 pm. Pharmacy consulted to restart heparin  while apixaban  on hold pending need for renal biopsy.    Heparin  level 0.33 was therapeutic on 1200 units/hr. Off now for procedure witih IR. Successful right-sided port placement @ 12:00 today. Will resume therapeutic anticoagulation 6 hours after cath placement per post-procedure IR post-procedure anticoagulation protocol.  Goal of Therapy:  Heparin  level 0.3-0.7 units/ml aPTT 66-102 seconds Monitor platelets by anticoagulation protocol: Yes   Plan:  Resume heparin  infusion with no bolus at 1200 units/hr @18 :00 Check heparin  level in 8 hours and daily while on heparin  Continue to monitor H&H and platelets  Thank you for allowing pharmacy  to be a part of this patients care.  Shelba Collier, PharmD, BCPS Clinical Pharmacist     [1]  Allergies Allergen Reactions   Amlodipine  Other (See Comments)    Leg swelling   Empagliflozin  Other (See Comments)    Euglycemic DKA   Amitriptyline  Other (See Comments)    Urinary retention   Lisinopril  Other (See Comments)    Hyperkalemia    Nortriptyline  Hcl Other (See Comments)    Vivid / bad dreams   Statins     Aches in Joints    Simvastatin  Other (See Comments)    Arthralgias

## 2024-12-04 NOTE — Progress Notes (Signed)
 Colin Keller   DOB:01/28/1956   FM#:[id number]    Brief Oncology NOTE: Patient seen by Med Onc in consultation on 12/03/24.   Plan:  Placed order today for IR eval for port-a-cath in preparation for chemotherapy.   Please coordinate timing as he is on IV heparin .  Chemotherapy to be initiated the week of 12/17/2024 pending all approvals.  Will plan for chemotherapy regimen with gemcitabine/abraxane; after multidisciplinary conference.  Labs reviewed today; stable.  Vitals:   12/04/24 0659 12/04/24 0732  BP:  (!) 142/74  Pulse: 100 95  Resp: 13 13  Temp:  (!) 97.4 F (36.3 C)  SpO2:  100%   Filed Weights   12/02/24 0231 12/03/24 0500 12/04/24 0409  Weight: 151 lb 0.2 oz (68.5 kg) 143 lb 8.3 oz (65.1 kg) 132 lb 11.5 oz (60.2 kg)    Olam PARAS Colin Veley, NP 12/04/2024 10:20 AM    Labs Reviewed:  Lab Results  Component Value Date   WBC 12.8 (H) 12/04/2024   HGB 8.8 (L) 12/04/2024   HCT 26.8 (L) 12/04/2024   MCV 95.0 12/04/2024   PLT 272 12/04/2024   Recent Labs    11/24/24 0822 11/25/24 0414 11/29/24 0805 11/30/24 0938 12/01/24 0343 12/02/24 0346 12/03/24 0437 12/04/24 0326  NA  --    < > 136 142 139 138 138 137  K  --    < > 4.5 5.0 4.3 4.5 4.3 3.9  CL  --    < > 103 101 98 98 100 99  CO2  --    < > 22 32 27 21* 25 24  GLUCOSE  --    < > 141* 171* 332* 384* 244* 281*  BUN  --    < > 53* 52* 61* 73* 75* 75*  CREATININE  --    < > 2.77* 2.92* 3.17* 3.48* 3.45* 3.36*  CALCIUM   --    < > 8.8* 9.2 8.8* 8.6* 8.5* 8.7*  GFRNONAA  --    < > 24* 22* 20* 18* 18* 19*  PROT 6.0*   < > 5.8* 6.0* 5.9*  --   --  5.7*  ALBUMIN  1.9*   < > 2.7* 2.8* 2.8*  --   --  2.7*  AST 347*   < > 18 20 19   --   --  13*  ALT 211*   < > 74* 65* 52*  --   --  30  ALKPHOS 1,158*   < > 704* 678* 620*  --   --  528*  BILITOT 3.2*   < > 0.8 0.8 0.8  --   --  0.7  BILIDIR 1.8*  --  0.6*  --   --   --   --   --   IBILI 1.4*  --  0.2*  --   --   --   --   --    < > = values in this interval not displayed.     Studies Reviewed:

## 2024-12-05 ENCOUNTER — Other Ambulatory Visit: Payer: Self-pay | Admitting: Oncology

## 2024-12-05 DIAGNOSIS — C259 Malignant neoplasm of pancreas, unspecified: Secondary | ICD-10-CM

## 2024-12-05 LAB — CBC
HCT: 26.2 % — ABNORMAL LOW (ref 39.0–52.0)
Hemoglobin: 8.5 g/dL — ABNORMAL LOW (ref 13.0–17.0)
MCH: 30.8 pg (ref 26.0–34.0)
MCHC: 32.4 g/dL (ref 30.0–36.0)
MCV: 94.9 fL (ref 80.0–100.0)
Platelets: 347 K/uL (ref 150–400)
RBC: 2.76 MIL/uL — ABNORMAL LOW (ref 4.22–5.81)
RDW: 15.5 % (ref 11.5–15.5)
WBC: 10.8 K/uL — ABNORMAL HIGH (ref 4.0–10.5)
nRBC: 0 % (ref 0.0–0.2)

## 2024-12-05 LAB — COMPREHENSIVE METABOLIC PANEL WITH GFR
ALT: 25 U/L (ref 0–44)
AST: 15 U/L (ref 15–41)
Albumin: 2.5 g/dL — ABNORMAL LOW (ref 3.5–5.0)
Alkaline Phosphatase: 470 U/L — ABNORMAL HIGH (ref 38–126)
Anion gap: 16 — ABNORMAL HIGH (ref 5–15)
BUN: 74 mg/dL — ABNORMAL HIGH (ref 8–23)
CO2: 20 mmol/L — ABNORMAL LOW (ref 22–32)
Calcium: 8.7 mg/dL — ABNORMAL LOW (ref 8.9–10.3)
Chloride: 98 mmol/L (ref 98–111)
Creatinine, Ser: 3.4 mg/dL — ABNORMAL HIGH (ref 0.61–1.24)
GFR, Estimated: 19 mL/min — ABNORMAL LOW
Glucose, Bld: 266 mg/dL — ABNORMAL HIGH (ref 70–99)
Potassium: 4.1 mmol/L (ref 3.5–5.1)
Sodium: 135 mmol/L (ref 135–145)
Total Bilirubin: 0.6 mg/dL (ref 0.0–1.2)
Total Protein: 5.5 g/dL — ABNORMAL LOW (ref 6.5–8.1)

## 2024-12-05 LAB — GLUCOSE, CAPILLARY
Glucose-Capillary: 310 mg/dL — ABNORMAL HIGH (ref 70–99)
Glucose-Capillary: 176 mg/dL — ABNORMAL HIGH (ref 70–99)
Glucose-Capillary: 34 mg/dL — CL (ref 70–99)
Glucose-Capillary: 39 mg/dL — CL (ref 70–99)
Glucose-Capillary: 48 mg/dL — ABNORMAL LOW (ref 70–99)
Glucose-Capillary: 39 mg/dL — CL (ref 70–99)
Glucose-Capillary: 103 mg/dL — ABNORMAL HIGH (ref 70–99)
Glucose-Capillary: 88 mg/dL (ref 70–99)

## 2024-12-05 LAB — CANCER ANTIGEN 19-9: CA 19-9: 177 U/mL — ABNORMAL HIGH (ref 0–35)

## 2024-12-05 LAB — CEA: CEA: 4.9 ng/mL — ABNORMAL HIGH (ref 0.0–4.7)

## 2024-12-05 LAB — HEPARIN LEVEL (UNFRACTIONATED): Heparin Unfractionated: 0.29 [IU]/mL — ABNORMAL LOW (ref 0.30–0.70)

## 2024-12-05 MED ORDER — DEXTROSE 50 % IV SOLN
INTRAVENOUS | Status: AC
  Administered 2024-12-05: 50 mL via INTRAVENOUS
  Filled 2024-12-05: qty 50

## 2024-12-05 MED ORDER — SENNA 8.6 MG PO TABS
2.0000 | ORAL_TABLET | Freq: Two times a day (BID) | ORAL | Status: DC
  Administered 2024-12-05: 17.2 mg via ORAL
  Filled 2024-12-05: qty 2

## 2024-12-05 MED ORDER — LACTULOSE 10 GM/15ML PO SOLN
10.0000 g | Freq: Three times a day (TID) | ORAL | Status: DC
  Administered 2024-12-05 (×2): 10 g via ORAL
  Filled 2024-12-05 (×2): qty 15

## 2024-12-05 MED ORDER — OXYCODONE HCL 5 MG PO TABS: 5.0000 mg | ORAL_TABLET | ORAL | Status: DC | PRN

## 2024-12-05 MED ORDER — AMIODARONE HCL 200 MG PO TABS
200.0000 mg | ORAL_TABLET | Freq: Two times a day (BID) | ORAL | Status: DC
  Administered 2024-12-05 – 2024-12-06 (×3): 200 mg via ORAL
  Filled 2024-12-05 (×3): qty 1

## 2024-12-05 MED ORDER — BISACODYL 10 MG RE SUPP
10.0000 mg | Freq: Once | RECTAL | Status: AC
  Administered 2024-12-05: 10 mg via RECTAL
  Filled 2024-12-05: qty 1

## 2024-12-05 MED ORDER — APIXABAN 5 MG PO TABS
5.0000 mg | ORAL_TABLET | Freq: Two times a day (BID) | ORAL | Status: DC
  Administered 2024-12-05 – 2024-12-06 (×3): 5 mg via ORAL
  Filled 2024-12-05 (×3): qty 1

## 2024-12-05 MED ORDER — OXYCODONE HCL 5 MG PO TABS
10.0000 mg | ORAL_TABLET | ORAL | Status: DC | PRN
  Administered 2024-12-05 – 2024-12-06 (×2): 10 mg via ORAL
  Filled 2024-12-05 (×2): qty 2

## 2024-12-05 NOTE — Discharge Instructions (Addendum)
Dear Colin Keller,  Thank you for letting us  participate in your care. You were hospitalized for acute cholecystitis and biliary stricture and found to have Pancreatic adenocarcinoma (HCC). You were treated with antibiotics, cholecystotomy tube, and biliary stent placement.  Recommend that you continue to discuss goals of care with oncology.  POST-HOSPITAL & CARE INSTRUCTIONS Take amiodarone  200 mg twice daily for 1 week and then 200 mg daily thereafter. Follow-up with general surgery, cardiology, nephrology, oncology, and IR Go to your follow up appointments (listed below)   DOCTOR'S APPOINTMENT   Future Appointments  Date Time Provider Department Center  12/26/2024  9:00 AM [facility]  12/26/2024 10:00 AM [facility] 3 [facility]  01/09/2025 10:00 AM McDiarmid, Krystal BIRCH, MD [facility]  01/21/2025 10:00 AM Darron Deatrice LABOR, MD [facility]&V    Contact information for follow-up providers     Vernetta Berg, MD. Call on 11/17/2024.   Specialty: General Surgery Why: At 11:10PM; Arrive 30 mins prior to scheduled appointment time, Please bring your ID and insurance cards. Contact information: [address] [place] [address] [phone]              Contact information for after-discharge care     Home Medical Care     [facility]) .   Service: Home Health Services Contact information: [address] [place]  [address] [phone]                     Take care and be well!  Family Medicine Teaching Service Inpatient Team [place]  [facility]  [address] [place], [place] [address] [phone]

## 2024-12-05 NOTE — Assessment & Plan Note (Addendum)
 Cr stable but elevated to 3.4. Adequate UOP.  After extensive discussion with family and nephrology, decision was reached not to pursue a kidney biopsy at this time due to new overwhelming cancer diagnosis.  Nephrology agrees given creatinine stability it is reasonable to defer biopsy to outpatient setting. - Nephro signed off, appreciate recommendations, outpatient follow-up after discharge - Holding hydrochlorothiazide  - AM CMP

## 2024-12-05 NOTE — Assessment & Plan Note (Addendum)
 Pancreatic adenocarcinoma following with Heme-Onc.  Port-A-Cath placed by IR yesterday, ready to use.  Likely also constipated.  1 BM 12/23 after suppository, first in multiple days. S/p additional 1 glycerin  suppository overnight. - Pain regimen: oxycodone  5 mg Q6h PRN moderate pain OR oxycodone  10 mg Q6h PRN for severe pain - Bowel regimen: Lactulose  10 g TID, senna 17.2 mg BID, Dulcolax suppository

## 2024-12-05 NOTE — Assessment & Plan Note (Addendum)
 Hgb stable at 8.5.  No clinical signs of bleeding.  Currently on heparin  gtt.  - Transition to Eliquis  5 mg BID today - CBC daily - Transfusion threshold < 8

## 2024-12-05 NOTE — Progress Notes (Addendum)
 PHARMACY - ANTICOAGULATION CONSULT NOTE  Pharmacy Consult for Transition to Apixaban  from UFH Indication: pulmonary embolus (11/15/24)  Allergies[1]  Patient Measurements: Height: 5' 10 (177.8 cm) Weight: 67.8 kg (149 lb 7.6 oz) IBW/kg (Calculated) : 73 HEPARIN  DW (KG): 67.1  Vital Signs: Temp: 98.3 F (36.8 C) (12/25 0735) Temp Source: Oral (12/25 0735) BP: 132/99 (12/25 0735) Pulse Rate: 100 (12/25 0735)  Labs: Recent Labs    12/03/24 0437 12/03/24 1429 12/03/24 1813 12/04/24 0326 12/05/24 0358  HGB 8.0* 8.5*  --  8.8* 8.5*  HCT 24.3* 25.2*  --  26.8* 26.2*  PLT 387 404*  --  272 347  LABPROT 16.3*  --   --   --   --   INR 1.2  --   --   --   --   HEPARINUNFRC 0.22*  --  0.33 0.33 0.29*  CREATININE 3.45*  --   --  3.36* 3.40*    Estimated Creatinine Clearance: 19.4 mL/min (A) (by C-G formula based on SCr of 3.4 mg/dL (H)).  Assessment: Colin Keller is a 68 y.o. male admitted on 11/19/2024 with concern for cholecystitis s/p ERCP w/ stenting on 12/12 and percutaneous cholecystostomy by IR on 12/14. Recently diagnosed with new PE (11/15/24) and started on eliquis . Last dose eliquis  prior to admission 12/8 @ 9:30 pm. Pharmacy consulted to restart heparin  while apixaban  on hold pending need for renal biopsy.    Pt transitioning from heparin  to apixaban . Per RN, no signs of bleeding. Will transition to apixaban  5 mg BID since pt has been on therapeutic heparin  since admission.   Hgb 8.5, PLT WNL  Goal of Therapy:  Heparin  level 0.3-0.7 units/mL Monitor platelets by anticoagulation protocol: Yes   Plan:  Stop heparin  1250 units/hr Start Eliquis  5 mg PO BID Continue to monitor H&H and platelets  Prentice DOROTHA Favors, PharmD PGY1 Health-System Pharmacy Administration and Leadership Resident [facility]  12/05/2024 8:11 AM         [1]  Allergies Allergen Reactions   Amlodipine  Other (See Comments)    Leg swelling   Empagliflozin  Other (See  Comments)    Euglycemic DKA   Amitriptyline  Other (See Comments)    Urinary retention   Lisinopril  Other (See Comments)    Hyperkalemia    Nortriptyline  Hcl Other (See Comments)    Vivid / bad dreams   Statins     Aches in Joints    Simvastatin  Other (See Comments)    Arthralgias

## 2024-12-05 NOTE — Progress Notes (Signed)
 PHARMACY - ANTICOAGULATION CONSULT NOTE  Pharmacy Consult for IV heparin  Indication: pulmonary embolus (11/15/24)  Allergies[1]  Patient Measurements: Height: 5' 10 (177.8 cm) Weight: 60.2 kg (132 lb 11.5 oz) IBW/kg (Calculated) : 73 HEPARIN  DW (KG): 67.1  Vital Signs: Temp: 97.7 F (36.5 C) (12/25 0318) Temp Source: Oral (12/25 0318) BP: 141/72 (12/25 0318) Pulse Rate: 98 (12/25 0318)  Labs: Recent Labs    12/03/24 0437 12/03/24 1429 12/03/24 1813 12/04/24 0326 12/05/24 0358  HGB 8.0* 8.5*  --  8.8* 8.5*  HCT 24.3* 25.2*  --  26.8* 26.2*  PLT 387 404*  --  272 347  LABPROT 16.3*  --   --   --   --   INR 1.2  --   --   --   --   HEPARINUNFRC 0.22*  --  0.33 0.33 0.29*  CREATININE 3.45*  --   --  3.36* 3.40*    Estimated Creatinine Clearance: 17.2 mL/min (A) (by C-G formula based on SCr of 3.4 mg/dL (H)).  Assessment: Colin Keller is a 68 y.o. male admitted on 11/19/2024 with concern for cholecystitis s/p ERCP w/ stenting on 12/12 and percutaneous cholecystostomy by IR on 12/14. Recently diagnosed with new PE (11/15/24) and started on eliquis . Last dose eliquis  prior to admission 12/8 @ 9:30 pm. Pharmacy consulted to restart heparin  while apixaban  on hold pending need for renal biopsy.    Heparin  level 0.33 was therapeutic on 1200 units/hr. Off now for procedure witih IR. Successful right-sided port placement @ 12:00 today. Will resume therapeutic anticoagulation 6 hours after cath placement per post-procedure IR post-procedure anticoagulation protocol.  12/25 AM update:  Heparin  level sub-therapeutic after re-start s/p procedure  Goal of Therapy:  Heparin  level 0.3-0.7 units/mL Monitor platelets by anticoagulation protocol: Yes   Plan:  Inc heparin  to 1250 units/hr Check heparin  level in 8 hours and daily while on heparin  Continue to monitor H&H and platelets  [name] Mckusick, PharmD, BCPS Clinical Pharmacist Phone: [phone]       [1]  Allergies Allergen  Reactions   Amlodipine  Other (See Comments)    Leg swelling   Empagliflozin  Other (See Comments)    Euglycemic DKA   Amitriptyline  Other (See Comments)    Urinary retention   Lisinopril  Other (See Comments)    Hyperkalemia    Nortriptyline  Hcl Other (See Comments)    Vivid / bad dreams   Statins     Aches in Joints    Simvastatin  Other (See Comments)    Arthralgias

## 2024-12-05 NOTE — Progress Notes (Signed)
 "  Progress Note  Patient Name: Colin Keller Date of Encounter: 12/05/2024 [facility] Cardiologist: Redell Shallow, MD   Interval Summary    No issues overnight- had placement of port yesterday. Remains in afib, rate-controlled. Anxious to go home.  Vital Signs Vitals:   12/04/24 2339 12/05/24 0318 12/05/24 0630 12/05/24 0735  BP: (!) 143/67 (!) 141/72  110/74  Pulse:  98  90  Resp: 13 13  16   Temp: 97.6 F (36.4 C) 97.7 F (36.5 C)  98.3 F (36.8 C)  TempSrc: Oral Oral  Oral  SpO2: 95% 96%  98%  Weight:   67.8 kg   Height:        Intake/Output Summary (Last 24 hours) at 12/05/2024 0740 Last data filed at 12/05/2024 0600 Gross per 24 hour  Intake 566.2 ml  Output 700 ml  Net -133.8 ml      12/05/2024    6:30 AM 12/04/2024    4:09 AM 12/03/2024    5:00 AM  Last 3 Weights  Weight (lbs) 149 lb 7.6 oz 132 lb 11.5 oz 143 lb 8.3 oz  Weight (kg) 67.8 kg 60.2 kg 65.1 kg      Telemetry/ECG  Afib HR 90s - Personally Reviewed  Physical Exam  GEN: No acute distress.   Neck: + JVD Cardiac: Irreg Irreg, no murmurs, rubs, or gallops.  Respiratory: Clear to auscultation bilaterally. GI: Soft, nontender, non-distended  MS: mild pedal edema  Echo 11/27/24: 1. Left ventricular ejection fraction, by estimation, is 40 to 45%. The  left ventricle has mildly decreased function. The left ventricle  demonstrates global hypokinesis. There is mild concentric left ventricular  hypertrophy. Left ventricular diastolic  function could not be evaluated.   2. Right ventricular systolic function is moderately reduced.   3. Left atrial size was moderately dilated.   4. The mitral valve is degenerative. Trivial mitral valve regurgitation.  Severe mitral annular calcification.   5. The aortic valve is tricuspid. There is mild calcification of the  aortic valve. Aortic valve sclerosis/calcification is present, without any  evidence of aortic stenosis.    Patient Profile     68 y.o. male with a history of CAD s/p CABG (2019), PAD s/p left SFA angioplasty (2023), CKDIV, carotid artery disease who presented biliary sepsis s/p ERCP stenting (12/12) and cholecystostomy (12/14) and hospitalization c/b PE (12/5), new onset Afib with RVR (12/17) and anemia requiring 1 unit of pRBCs while on Eliquis  (12/17). Repeat TTE 12/17 with LVEF 40-45% (from 50-55% 12/13) in setting of Afib with RVR. He was started on amio gtt on 12/20 given inability to tolerate PO intake and had a brief interrupt in heparin  gtt at that time. He converted to NSR on 12/21. Hydralazine  was held on 12/20.    Converted back to Afib RVR 12/02/24 at ~0815 with abnormal EKG with ST changes, no chest pain. Renal biopsy scheduled 12/02/24 canceled due to RVR and HTN. Heparin  gtt restarted. Amiodarone  gtt restarted.  Assessment & Plan   Afib RVR - initially converted on IV amiodarone , discontinued 12/22 with plans for oral cardizem  - back in afib 12/22, he remains in Afib with HR 90-110 - Change IV Heparin  to Eliquis  per pharmacy - Coreg  6.25mg BID>will increase for better rate control and BP - Will transition to oral amiodarone  today - 200 mg BID x 1 week, then 200 mg daily   EKG changes CAD s/p CABG 2019 - in the setting of Afib RVR- ST depression in inferior  and lateral leads - HS trop mild and flat, likely due to RVR and not ACS - repeat EKG showed better rate control - he reports chest pain from stomach issues (constipation and indigestion)  Acute on chronic renal insufficiency 3-4 - baseline Scr 1.9-2.2 - Scr 2.92>3.48>3.45>3.36 >3.4 - AKI suspected prerenal, possible AIN - plan for biopsy, however family wanted to defer  HTN - Coreg  - hydralazine  - Imdur  - dilt held  Acute on chronic systolic heart failure - LVEF 59-54%, moderately reduced RV function, severe MAC - GDMT with Hydral, Imdur , and Coreg  - CKD limiting GDMT - mild edema on exam with JVD - diuresis per nephrology. Kidney  function seems to be improving  PE - diagnosed 11/15/24, was placed on OAC - Switch back to DOAC per pharmacy   Pancreatic adenocarcinoma - oncology is following, pan for chemotherapy     For questions or updates, please contact [facility] Please consult [email or web address] for contact info under   Vinie KYM Maxcy, MD, [facility], FNLA, FACP    [facility]  Medical Director of the [facility] Lipid Disorders &  Cardiovascular Risk Reduction Clinic Diplomate of the American Board of Clinical Lipidology Attending Cardiologist  Direct Dial: [phone]  Fax: [phone]  Website:  [email or web address] , Vinie JAYSON Maxcy, MD   "

## 2024-12-05 NOTE — Progress Notes (Signed)
 "    Daily Progress Keller Intern Pager: [phone]  Patient name: Colin Keller Medical record number: [id number] Date of birth: 08/19/1956 Age: 68 y.o. Gender: male  Primary Care Provider: McDiarmid, Krystal BIRCH, MD Consultants: Nephro (s/o), Cardiology, IR, GS, GI, Oncology Code Status: Full code  Pt Overview and Major Events to Date:  12/9: Colin Keller 12/12: ERCP, transferred to ICU for persistent bradycardia 12/14: Return to floor, IR nephro consulted, IR placed cholecystotomy tube 12/17: New A-fib with RVR 12/18: EUS, pancreatic biopsies confirmed adenocarcinoma  Medical Decision Making: Colin Keller is a 68 y.o. male with PMHx of PE, T2DM, HFrEF (EF 40 - 45%), CKD stage IIIb, CAD, HTN, HLD, BPH, and GERD Colin Keller initially for abdominal pain found to have CBD stricture and acute cholecystitis treated with percutaneous cholecystotomy tube placement by IR.  Subsequent EUS demonstrated pancreatic concerns and biopsies confirmed pancreatic adenocarcinoma.  Oncology is consulted and recommended chemotherapy to start in January, Port-A-Cath was placed by IR 12/24 inpatient due to anticoagulation management.  Nephrology was previously consulting to further workup worsening AKI, patient deferred kidney biopsy to outpatient due to ongoing cancer workup and diagnosis.  Nephrology is agreeable, given stable elevated creatinine at this point. Assessment & Plan Pancreatic adenocarcinoma (HCC) Multiple lung nodules on CT Abdominal pain Pancreatic adenocarcinoma following with Heme-Onc.  Port-A-Cath placed by IR yesterday, ready to use.  Likely also constipated.  1 BM 12/23 after suppository, first in multiple days. S/p additional 1 glycerin  suppository overnight. - Pain regimen: oxycodone  5 mg Q6h PRN moderate pain OR oxycodone  10 mg Q6h PRN for severe pain - Bowel regimen: Lactulose  10 g TID, senna 17.2 mg BID, Dulcolax suppository Biliary stricture (HCC) Biliary sepsis  (HCC) Cholecystitis Leukocytosis and alk phos downtrending.  Afebrile, VSS.  Perc chole tube in place and draining.  GI recommended patient be evaluated by oncology and surgery to determine if he is a surgical candidate since he may need stents placed outpatient in 3 to 4 months. - Continue pantoprazole  40 mg BID - Continue carafate  1g BID - GI and Gen Surg consulted, appreciate recs - Cholecystostomy tube in place. IR consulted, appreciate recommendations - AM CMP Anemia Acute pulmonary embolism (HCC) Hgb stable at 8.5.  No clinical signs of bleeding.  Currently on heparin  gtt.  - Transition to Eliquis  5 mg BID today - CBC daily - Transfusion threshold < 8 Coronary artery disease involving native coronary artery of native heart without angina pectoris Atrial fibrillation with RVR (HCC) HTN (hypertension) Still in A-fib, rate controlled at the moment, ranging from 90-110.  - Transition Amiodarone  to 200 mg BID x1 week, followed by 200 mg daily - Increase carvedilol  12.5 mg BID w/ meals - Heparin  transitioned to Eliquis  5 mg BID - Continue Imdur  60 mg daily - Continue hydralazine  50 mg PO q8h - Cards consulted, appreciate their recs - AM CMP and Mag AKI (acute kidney injury) Cr stable but elevated to 3.4. Adequate UOP.  After extensive discussion with family and nephrology, decision was reached not to pursue a kidney biopsy at this time due to new overwhelming cancer diagnosis.  Nephrology agrees given creatinine stability it is reasonable to defer biopsy to outpatient setting. - Nephro signed off, appreciate recommendations, outpatient follow-up after discharge - Holding hydrochlorothiazide  - AM CMP T2DM (type 2 diabetes mellitus) (HCC) CBGs labile over the past 24 hours, with the lowest being 47 and highest being 342.  Required repeat dextrose  administrations yesterday.  Caution with excess LAI today, AM  CBG 310.  - Lantus  10 units daily - Will consider TID dosing with SAI instead  of Lantus , if CBGs continue to be labile - Continue sensitive SSI - Hold home metformin  Eye pain, left - Continue tetracaine  0.5% ophthalmic anesthetic eye drops - Continue protective eye patch Chronic health problem BPH: Continue home tamsulosin  GERD: Continue home Protonix  HFrEF: LVEF 40 to 45%, moderately reduced RV function, severe MAC  FEN/GI: Regular diet PPx: Heparin  Dispo:Home with home health tomorrow. Barriers include switching anticoagulation from heparin  to DOAC and ensuring stability of sugars.   Subjective:  Patient continues to complain of abdominal pain and distention this morning due to lack of bowel movement.   Objective: Temp:  [97.6 F (36.4 C)-98.3 F (36.8 C)] 98.3 F (36.8 C) (12/25 0735) Pulse Rate:  [72-105] 99 (12/25 0814) Resp:  [9-20] 17 (12/25 0735) BP: (116-154)/(63-99) 132/99 (12/25 0814) SpO2:  [95 %-100 %] 98 % (12/25 0735) Weight:  [67.8 kg] 67.8 kg (12/25 0630) Physical Exam: General: Awake and Alert in NAD HEENT: NCAT. Sclera anicteric. No rhinorrhea. Cardiovascular: Irregularly irregular. Respiratory: CTAB, normal WOB on RA. No wheezing, crackles, rhonchi, or diminished breath sounds. Abdomen: Soft, non-tender, mild distention, no TTP or guarding. Bowel sounds hypoactive. Extremities: No BLE edema, no deformities or significant joint findings. Skin: Warm and dry. No abrasions or rashes noted. Neuro: A&Ox3. No focal neurological deficits.  Laboratory: Most recent CBC Lab Results  Component Value Date   WBC 10.8 (H) 12/05/2024   HGB 8.5 (L) 12/05/2024   HCT 26.2 (L) 12/05/2024   MCV 94.9 12/05/2024   PLT 347 12/05/2024   Most recent BMP    Latest Ref Rng & Units 12/05/2024    3:58 AM  BMP  Glucose 70 - 99 mg/dL 733   BUN 8 - 23 mg/dL 74   Creatinine 9.38 - 1.24 mg/dL 6.59   Sodium 864 - 854 mmol/L 135   Potassium 3.5 - 5.1 mmol/L 4.1   Chloride 98 - 111 mmol/L 98   CO2 22 - 32 mmol/L 20   Calcium  8.9 - 10.3 mg/dL 8.7      Other pertinent labs: CEA: 4.9 Ca 19-9: 177 Alk phos: 470 Mag: 2.7  Imaging/Diagnostic Tests: IR Guided Port Insertion - Successful placement of a RIGHT internal jugular approach power injectable Port-A-Cath. The tip of the catheter is positioned within the proximal RIGHT atrium. The catheter is ready for immediate use.  Janna Ferrier, DO 12/05/2024, 10:43 AM  PGY-2, [facility] FPTS Intern pager: [phone], text pages welcome Secure chat group [facility] Teaching Service   "

## 2024-12-05 NOTE — Assessment & Plan Note (Addendum)
 Leukocytosis and alk phos downtrending.  Afebrile, VSS.  Perc chole tube in place and draining.  GI recommended patient be evaluated by oncology and surgery to determine if he is a surgical candidate since he may need stents placed outpatient in 3 to 4 months. - Continue pantoprazole  40 mg BID - Continue carafate  1g BID - GI and Gen Surg consulted, appreciate recs - Cholecystostomy tube in place. IR consulted, appreciate recommendations - AM CMP

## 2024-12-05 NOTE — Assessment & Plan Note (Addendum)
 Still in A-fib, rate controlled at the moment, ranging from 90-110.  - Transition Amiodarone  to 200 mg BID x1 week, followed by 200 mg daily - Increase carvedilol  12.5 mg BID w/ meals - Heparin  transitioned to Eliquis  5 mg BID - Continue Imdur  60 mg daily - Continue hydralazine  50 mg PO q8h - Cards consulted, appreciate their recs - AM CMP and Mag

## 2024-12-05 NOTE — Progress Notes (Signed)
 "  [facility]  HEMATOLOGY/ONCOLOGY IN-PATIENT PROGRESS NOTE   PATIENT NAME: Colin Keller   MR#: [id number] DOB: [birth year] CSN#: [id number]   DATE OF SERVICE: 12/05/2024  ASSESSMENT & PLAN:   Colin Keller is a 68 y.o. gentleman with past medical history of CAD status post CABG, CHF, type 2 diabetes mellitus, dyslipidemia, hypertension, peripheral vascular disease, was admitted to the hospital on 11/20/2023 after he presented with abdominal pain.  Workup showed evidence of pancreatic tail lesion, biopsy proven to be pancreatic adenocarcinoma.  Pancreatic adenocarcinoma Biopsy-proven pancreatic cancer, likely early stage based on imaging and absence of regional lymphadenopathy. Full disease extent remains undefined due to imaging limitations from chronic kidney disease. Surgical resection may be feasible pending further staging and assessment of vascular involvement and distant metastasis.    Neoadjuvant chemotherapy is recommended to attempt tumor shrinkage and improve surgical outcomes. Multidisciplinary management is required.  -Most likely will proceed with combination of gemcitabine and Abraxane chemotherapy, to be initiated in the outpatient setting.  Chemotherapy is indicated but deferred due to poor functional status (ECOG 3), with significant impairment in self-care and increased risk for severe chemotherapy-related adverse effects. Improvement in physical strength and independence is required prior to initiation to optimize safety and outcomes. - Deferred chemotherapy initiation until functional status improves. - Planned multidisciplinary case discussion at upcoming conference. - We will schedule follow-up in the first week of January to reassess strength and readiness for chemotherapy. - Planned patient education regarding chemotherapy prior to initiation. - Will involve surgical team at a later stage.  - We will review his case in multidisciplinary tumor  conference for management planning.  Indeterminate lung nodules Two small lung nodules (5 mm and 8 mm) identified on chest CT. Differential includes metastatic pancreatic cancer, smoking-related changes, infection, or inflammation. Further evaluation is required to determine metastatic disease, which would upstage to stage IV and preclude surgical management. - Ordered PET scan to assess metabolic activity of lung nodules and evaluate for metastatic disease.  This will be done outpatient. - Will review PET scan results in multidisciplinary conference to guide staging and management.   Pulmonary embolism Small pulmonary embolism identified during recent hospitalization, likely provoked by pancreatic cancer. Anticoagulation is required to reduce risk of further thromboembolic events. Coordination of anticoagulation timing is necessary for planned procedures. - Continued heparin  for anticoagulation while inpatient. - Please coordinate timing of anticoagulation hold for port-a-cath placement with procedural team. - Will transition to appropriate outpatient anticoagulation regimen after discharge.   Chronic kidney disease with acute kidney injury Chronic kidney disease with acute decline in renal function, possibly exacerbated by recent contrast exposure and underlying illness. Nephrology is involved. Kidney biopsy deferred due to low likelihood of changing management and patient apprehension. Avoidance of further nephrotoxic insults is critical to prevent progression to dialysis, which would complicate cancer management. - Continued supportive care with hydration and avoidance of nephrotoxic agents. - Will monitor renal function closely with serial labs. - Will avoid contrast imaging unless renal function improves. - Deferred kidney biopsy unless there is a rapid or unexplained further decline in renal function.   Paroxysmal atrial fibrillation Paroxysmal atrial fibrillation developed during  hospitalization, likely related to acute illness and underlying cardiac comorbidities. Cardiac status must be optimized prior to surgical or procedural interventions. - Continued inpatient cardiac monitoring and management for atrial fibrillation. -We need to ensure cardiac status is optimized prior to surgical or procedural interventions.   Obstructive jaundice (resolved) Obstructive jaundice secondary  to pancreatic cancer, previously with elevated bilirubin and liver enzymes. After intervention, liver function tests have normalized and jaundice has resolved. - Please monitor liver function tests (CMP) to ensure continued resolution.  Protein-losing state with ascites Protein-losing state is resulting in hypoalbuminemia, ascites, and peripheral edema, compounded by limited oral intake and underlying malignancy. Protein supplementation is necessary but must be balanced with renal dysfunction to avoid excessive protein load. - Recommended daily intake of one regular Boost or Ensure supplement (not high protein). - Advised increased dietary protein within renal limits. - Emphasized importance of hydration with protein supplementation.  Constipation Constipation is multifactorial, related to decreased oral intake, limited mobility, and underlying disease. Recent interventions have led to some improvement. Abdominal radiography excluded obstruction. - Administered Miralax  and suppository. - Recommended increased dietary fiber. - Encouraged increased oral intake to promote regular bowel movements.  No additional recommendations from oncology standpoint at this time.  I will plan to see him around 12/18/2024 in clinic.  Our clinic will arrange appointments and inform patient.  Please call us  with any questions.  Chinita Patten, MD 12/05/2024 12:20 PM  ONCOLOGY HISTORY:  He was admitted to the hospital on 11/19/2024 after he presented with abdominal pain.  He had a prior hospitalization on  11/15/2024 when he was diagnosed with small left lower basilar segmental artery PE.  He was discharged on 11/17/2024 at that time.   On 11/19/2024, CT abdomen and pelvis without contrast showed gallbladder lumen is filled with high density material, likely vicarious excretion of intravenous contrast administered for chest CTA 11/15/2024. Gallbladder wall appears circumferentially thickened with small volume confluent edema in the right upper quadrant of the abdomen adjacent to the gallbladder. Imaging features raise the question of acute cholecystitis. Right upper quadrant ultrasound may prove helpful. Intrahepatic biliary duct dilatation. Common bile duct not well demonstrated on this noncontrast exam. Correlation with liver function test recommended. Soft tissue nodularity in the expected location of the left adrenal land may reflect nodular thickening, small lymph nodes, or vascular anatomy. This is not well evaluated given lack of intravenous contrast material.  Possible borderline lymphadenopathy in the left para-aortic space.   He had an ultrasound of the abdomen on 11/19/2024 which showed gallbladder wall thickening (7 mm) without focal tenderness.  Dilated CBD measuring 13.5 mm, without intrahepatic biliary ductal dilatation.   This led to MRCP on 11/19/2024 without contrast (patient has baseline CKD).  It showed diffuse biliary ductal dilatation due to distal common bile duct stricture. No evidence of choledocholithiasis. Multiple tiny cystic foci within the pancreatic head at the site of distal common bile duct stricture. This may be due to chronic pancreatitis, however, pancreatic mass cannot definitely be excluded on this unenhanced exam. Recommend further imaging evaluation with pancreatic protocol abdomen CT without and with contrast. Distended gallbladder with mild diffuse wall thickening, which is nonspecific. Differential diagnosis includes cholecystitis and other etiologies such as  hypoalbuminemia, liver disease, or heart failure. Anasarca and minimal perihepatic ascites.   His total bilirubin peaked at 4.2 on 11/22/2024.  Alkaline phosphatase has been elevated, with peak of 1158 on 11/24/2024.  Peak AST of 347, peak ALT of 211 on 11/24/2024.   GI was consulted.  On 11/22/2024, he underwent ERCP which showed distal biliary stricture with obstruction.  No evidence of cholangitis or common duct stones.  There was a question if the stricture was part of an obstructing lesion involving the cystic duct.  Normal pancreatic duct.  Endoscopic stent was placed into biliary  duct.   Eventually Dr. Wilhelmenia performed upper EUS and biopsy on 11/28/2024.  On EUS, irregular masslike lesion was identified in the pancreatic head, homogeneous and hypoechoic and rest of the remaining pancreatic parenchyma.  Lesion measured 33 mm x 23 mm.  Outer margins were irregular.  There was sonographic evidence suggesting abutment of the portal vein.  An intact interface was seen of the SMA and celiac trunk, suggesting lack of invasion.  FNA was obtained.  No malignant appearing lymph nodes were visualized in the celiac region and peripancreatic region.  T2, N0, MX by endosonographic criteria.   Cytology from FNA did confirm adenocarcinoma.  At this point we were consulted for additional recommendations and management.   On 11/15/2024, CT angiogram of the chest which showed evidence of small pulmonary embolism, also showed small lung nodules, 5 mm in the left upper lobe and 8 mm in the right lower lobe.   Will obtain PET scan in the outpatient setting.   CA 19-9 and CEA pending.   We could not obtain imaging with contrast because of his AKI on CKD.  Patient underwent Port-A-Cath placement on 12/04/2024.  Pending improvement in performance status/energy levels, plan is to proceed with chemotherapy using gemcitabine and Abraxane, in the neoadjuvant setting.  Multidisciplinary management will be planned  after discussion in GI tumor conference.  SUBJECTIVE:   He underwent port placement yesterday in preparation for chemotherapy. He remains significantly deconditioned, ambulating only short distances with physical therapy and spending most of the day in bed or on the couch. He experiences profound fatigue with minimal exertion, stating that straining on the bedside commode is exhausting. He is working to increase activity, including sitting up in the mornings and walking the hall twice yesterday with assistance.  He continues to have generalized weakness and low energy, compounded by poor oral intake. He describes breakfast today as the most food he has consumed in several days. He is attempting to increase protein intake with available supplements, but is cautious about high-protein formulations due to kidney dysfunction.  Abdominal bloating and distention persist, though there has been some improvement following recent bowel movements. He has had two additional bowel movements since the onset of bloating, including a small one this morning, and passes flatus intermittently. He received Miralax  and a suppository, which facilitated bowel movements. He continues to eat little, contributing to ongoing constipation. Abdominal x-ray last night did not reveal obstruction.  He is being transitioned to oral anticoagulation and took his first dose today. He remains anemic, likely secondary to chronic kidney disease, with otherwise stable blood counts. Liver function tests have improved except for alkaline phosphatase, which has decreased from a peak of 1158 to 470; AST, ALT, and bilirubin are now within normal limits. Kidney function remains impaired, with creatinine at 3.4.  OBJECTIVE:  Vitals:   12/05/24 1148 12/05/24 1151  BP:    Pulse:    Resp:    Temp: 97.6 F (36.4 C) 97.6 F (36.4 C)  SpO2:  100%     Intake/Output Summary (Last 24 hours) at 12/05/2024 1220 Last data filed at 12/05/2024  1100 Gross per 24 hour  Intake 603.36 ml  Output 900 ml  Net -296.64 ml    Physical Exam Constitutional:      General: He is not in acute distress.    Appearance: Normal appearance.  HENT:     Head: Normocephalic and atraumatic.  Eyes:     Conjunctiva/sclera: Conjunctivae normal.  Cardiovascular:  Rate and Rhythm: Normal rate and regular rhythm.  Pulmonary:     Effort: Pulmonary effort is normal. No respiratory distress.  Abdominal:     General: There is no distension.  Neurological:     General: No focal deficit present.     Mental Status: He is alert and oriented to person, place, and time.  Psychiatric:        Mood and Affect: Mood normal.        Behavior: Behavior normal.     LABS:   Results for orders placed or performed during the hospital encounter of 11/19/24 (from the past 24 hours)  Glucose, capillary     Status: Abnormal   Collection Time: 12/04/24 12:26 PM  Result Value Ref Range   Glucose-Capillary 169 (H) 70 - 99 mg/dL  Glucose, capillary     Status: Abnormal   Collection Time: 12/04/24  4:01 PM  Result Value Ref Range   Glucose-Capillary 54 (L) 70 - 99 mg/dL  Glucose, capillary     Status: Abnormal   Collection Time: 12/04/24  4:44 PM  Result Value Ref Range   Glucose-Capillary 47 (L) 70 - 99 mg/dL  Glucose, capillary     Status: Abnormal   Collection Time: 12/04/24  5:55 PM  Result Value Ref Range   Glucose-Capillary 61 (L) 70 - 99 mg/dL  Glucose, capillary     Status: Abnormal   Collection Time: 12/04/24  6:31 PM  Result Value Ref Range   Glucose-Capillary 56 (L) 70 - 99 mg/dL  Glucose, capillary     Status: Abnormal   Collection Time: 12/04/24  7:29 PM  Result Value Ref Range   Glucose-Capillary 147 (H) 70 - 99 mg/dL  Glucose, capillary     Status: Abnormal   Collection Time: 12/04/24  9:36 PM  Result Value Ref Range   Glucose-Capillary 135 (H) 70 - 99 mg/dL  Heparin  level (unfractionated)     Status: Abnormal   Collection Time:  12/05/24  3:58 AM  Result Value Ref Range   Heparin  Unfractionated 0.29 (L) 0.30 - 0.70 IU/mL  CBC     Status: Abnormal   Collection Time: 12/05/24  3:58 AM  Result Value Ref Range   WBC 10.8 (H) 4.0 - 10.5 K/uL   RBC 2.76 (L) 4.22 - 5.81 MIL/uL   Hemoglobin 8.5 (L) 13.0 - 17.0 g/dL   HCT 73.7 (L) 60.9 - 47.9 %   MCV 94.9 80.0 - 100.0 fL   MCH 30.8 26.0 - 34.0 pg   MCHC 32.4 30.0 - 36.0 g/dL   RDW 84.4 88.4 - 84.4 %   Platelets 347 150 - 400 K/uL   nRBC 0.0 0.0 - 0.2 %  Comprehensive metabolic panel with GFR     Status: Abnormal   Collection Time: 12/05/24  3:58 AM  Result Value Ref Range   Sodium 135 135 - 145 mmol/L   Potassium 4.1 3.5 - 5.1 mmol/L   Chloride 98 98 - 111 mmol/L   CO2 20 (L) 22 - 32 mmol/L   Glucose, Bld 266 (H) 70 - 99 mg/dL   BUN 74 (H) 8 - 23 mg/dL   Creatinine, Ser 6.59 (H) 0.61 - 1.24 mg/dL   Calcium  8.7 (L) 8.9 - 10.3 mg/dL   Total Protein 5.5 (L) 6.5 - 8.1 g/dL   Albumin  2.5 (L) 3.5 - 5.0 g/dL   AST 15 15 - 41 U/L   ALT 25 0 - 44 U/L   Alkaline Phosphatase 470 (H)  38 - 126 U/L   Total Bilirubin 0.6 0.0 - 1.2 mg/dL   GFR, Estimated 19 (L) >60 mL/min   Anion gap 16 (H) 5 - 15  Glucose, capillary     Status: Abnormal   Collection Time: 12/05/24  6:07 AM  Result Value Ref Range   Glucose-Capillary 310 (H) 70 - 99 mg/dL  Glucose, capillary     Status: Abnormal   Collection Time: 12/05/24 11:47 AM  Result Value Ref Range   Glucose-Capillary 176 (H) 70 - 99 mg/dL     IMAGING STUDIES:   IR IMAGING GUIDED PORT INSERTION Result Date: 12/04/2024 INDICATION: History of pancreatic cancer. EXAM: IMPLANTED PORT A CATH PLACEMENT WITH ULTRASOUND AND FLUOROSCOPIC GUIDANCE MEDICATIONS: None ANESTHESIA/SEDATION: Moderate (conscious) sedation was employed during this procedure. A total of Versed  1.5 mg and Fentanyl  75 mcg was administered intravenously. Moderate Sedation Time: 20 minutes. The patient's level of consciousness and vital signs were monitored  continuously by radiology nursing throughout the procedure under my direct supervision. FLUOROSCOPY: Radiation Exposure Index and estimated peak skin dose (PSD); Reference air kerma (RAK), 4 mGy. COMPLICATIONS: None immediate. PROCEDURE: The procedure, risks, benefits, and alternatives were explained to the patient. Questions regarding the procedure were encouraged and answered. The patient understands and consents to the procedure. The RIGHT neck and chest were prepped with chlorhexidine  in a sterile fashion, and a sterile drape was applied covering the operative field. Maximum barrier sterile technique with sterile gowns and gloves were used for the procedure. A timeout was performed prior to the initiation of the procedure. Local anesthesia was provided with 1% lidocaine  with epinephrine . After creating a small venotomy incision, a micropuncture kit was utilized to access the internal jugular vein under direct, real-time ultrasound guidance. Ultrasound image documentation was performed. The microwire was kinked to measure appropriate catheter length. A subcutaneous port pocket was then created along the upper chest wall utilizing a combination of sharp and blunt dissection. The pocket was irrigated with sterile saline. A single lumen power injectable port was chosen for placement. The 8 Fr catheter was tunneled from the port pocket site to the venotomy incision. The port was placed in the pocket. The external catheter was trimmed to appropriate length. At the venotomy, an 8 Fr peel-away sheath was placed over a guidewire under fluoroscopic guidance. The catheter was then placed through the sheath and the sheath was removed. Final catheter positioning was confirmed and documented with a fluoroscopic spot radiograph. The port was accessed with a Huber needle, aspirated and flushed with heparinized saline. The port pocket incision was closed with interrupted 3-0 Vicryl suture then Dermabond was applied, including at  the venotomy incision. Dressings were placed. The patient tolerated the procedure well without immediate post procedural complication. IMPRESSION: Successful placement of a RIGHT internal jugular approach power injectable Port-A-Cath. The tip of the catheter is positioned within the proximal RIGHT atrium. The catheter is ready for immediate use. Thom Hall, MD [facility] Electronically Signed   By: Thom Hall M.D.   On: 12/04/2024 15:02   DG Abd 1 View Result Date: 12/04/2024 CLINICAL DATA:  Abdominal pain and distention EXAM: ABDOMEN - 1 VIEW COMPARISON:  Four days ago FINDINGS: No abnormal bowel dilatation. Stable common bile duct stent. Stable pigtail catheter seen in right upper quadrant. Mild amount of stool seen in distal sigmoid colon and rectum. IMPRESSION: Stable support apparatus. No abnormal bowel dilatation. Electronically Signed   By: [name] Landy Mickey CHRISTELLA.D.  On: 12/04/2024 11:08   DG CHEST PORT 1 VIEW Result Date: 11/30/2024 CLINICAL DATA:  Shortness of breath. EXAM: PORTABLE CHEST 1 VIEW COMPARISON:  Chest [place]  dated 11/15/2024. FINDINGS: No focal consolidation, pleural effusion or pneumothorax. Mild central vascular congestion. The cardiac silhouette. Median sternotomy wires. No acute osseous pathology. IMPRESSION: Mild central vascular congestion. No focal consolidation. Electronically Signed   By: Vanetta Chou M.D.   On: 11/30/2024 12:27   DG Abd 1 View Result Date: 11/30/2024 EXAM: 1 VIEW XRAY OF THE ABDOMEN 11/30/2024 04:08:00 AM COMPARISON: CT of the abdomen and pelvis dated 11/19/2024. CLINICAL HISTORY: [id number] Vomiting [id number] Vomiting FINDINGS: LINES, TUBES AND DEVICES: Biliary stent has been placed. Pigtail catheter is looped just beneath the liver. BOWEL: There is moderate foreign fecal material within the colon. Nonobstructive bowel gas pattern. SOFT TISSUES: Moderate vascular calcifications. BONES: Mild  levoscoliosis of the lumbar spine and multilevel chronic degenerative disc disease. Status post sternotomy. No acute fracture. IMPRESSION: 1. Biliary stent and right upper quadrant pigtail catheter in place. 2. Moderate colonic stool burden without evidence of bowel obstruction. 3. No acute findings. Electronically signed by: Evalene Coho MD 11/30/2024 04:30 AM EST RP Workstation: [id number]   VAS US  LOWER EXTREMITY VENOUS (DVT) Result Date: 11/28/2024  Lower Venous DVT Study Patient Name:  HILDA WEXLER  Date of Exam:   11/26/2024 Medical Rec #: [id number]      Accession #:    [id number] Date of Birth: [birth year]      Patient Gender: M Patient Age:   42 years Exam Location:  [facility] Procedure:      VAS US  LOWER EXTREMITY VENOUS (DVT) Referring Phys: CARINA BROWN --------------------------------------------------------------------------------  Indications: Pulmonary embolism.  Comparison Study: No prior exam. Performing Technologist: Edilia Elden Appl  Examination Guidelines: A complete evaluation includes B-mode imaging, spectral Doppler, color Doppler, and power Doppler as needed of all accessible portions of each vessel. Bilateral testing is considered an integral part of a complete examination. Limited examinations for reoccurring indications may be performed as noted. The reflux portion of the exam is performed with the patient in reverse Trendelenburg.  +---------+---------------+---------+-----------+----------+--------------+ RIGHT    CompressibilityPhasicitySpontaneityPropertiesThrombus Aging +---------+---------------+---------+-----------+----------+--------------+ CFV      Full           No       Yes                                 +---------+---------------+---------+-----------+----------+--------------+ SFJ      Full           Yes      Yes                                 +---------+---------------+---------+-----------+----------+--------------+ FV Prox  Full                                                         +---------+---------------+---------+-----------+----------+--------------+ FV Mid   Full                                                        +---------+---------------+---------+-----------+----------+--------------+  FV DistalFull                                                        +---------+---------------+---------+-----------+----------+--------------+ PFV      Full                                                        +---------+---------------+---------+-----------+----------+--------------+ POP      Full           Yes      Yes                                 +---------+---------------+---------+-----------+----------+--------------+ PTV      Full                                                        +---------+---------------+---------+-----------+----------+--------------+ PERO     Full                                                        +---------+---------------+---------+-----------+----------+--------------+ Plaque noted in adjacent arteries.  +---------+---------------+---------+-----------+----------+--------------+ LEFT     CompressibilityPhasicitySpontaneityPropertiesThrombus Aging +---------+---------------+---------+-----------+----------+--------------+ CFV      Full           Yes      Yes                                 +---------+---------------+---------+-----------+----------+--------------+ SFJ      Full           Yes      Yes                                 +---------+---------------+---------+-----------+----------+--------------+ FV Prox  Full                                                        +---------+---------------+---------+-----------+----------+--------------+ FV Mid   Full                                                        +---------+---------------+---------+-----------+----------+--------------+ FV DistalFull                                                         +---------+---------------+---------+-----------+----------+--------------+  PFV      Full                                                        +---------+---------------+---------+-----------+----------+--------------+ POP      Full           Yes      Yes                                 +---------+---------------+---------+-----------+----------+--------------+ PTV      Full                                                        +---------+---------------+---------+-----------+----------+--------------+ PERO     Full                                                        +---------+---------------+---------+-----------+----------+--------------+     Summary: BILATERAL: - No evidence of deep vein thrombosis seen in the lower extremities, bilaterally. -No evidence of popliteal cyst, bilaterally.   *See table(s) above for measurements and observations. Electronically signed by Fonda Rim on 11/28/2024 at 7:49:10 AM.    Final    ECHOCARDIOGRAM LIMITED Result Date: 11/27/2024    ECHOCARDIOGRAM LIMITED REPORT   Patient Name:   LOCHLAN GRYGIEL Date of Exam: 11/27/2024 Medical Rec #:  [id number]     Height:       70.0 in Accession #:    [id number]    Weight:       148.8 lb Date of Birth:  04/20/1956     BSA:          1.841 m Patient Age:    68 years      BP:           149/101 mmHg Patient Gender: M             HR:           130 bpm. Exam Location:  Inpatient Procedure: Limited Echo, Cardiac Doppler and Color Doppler (Both Spectral and            Color Flow Doppler were utilized during procedure). Indications:    Afib with RVR  History:        Patient has prior history of Echocardiogram examinations, most                 recent 11/23/2024. PAD; Risk Factors:Dyslipidemia, Diabetes and                 Hypertension.  Sonographer:    Carmelita Hartshorn RDCS, FE, PE Referring Phys: [phone] CARINA M BROWN IMPRESSIONS  1. Left ventricular ejection fraction,  by estimation, is 40 to 45%. The left ventricle has mildly decreased function. The left ventricle demonstrates global hypokinesis. There is mild concentric left ventricular hypertrophy. Left ventricular diastolic function could not be evaluated.  2. Right ventricular systolic function is moderately reduced.  3. Left atrial size was  moderately dilated.  4. The mitral valve is degenerative. Trivial mitral valve regurgitation. Severe mitral annular calcification.  5. The aortic valve is tricuspid. There is mild calcification of the aortic valve. Aortic valve sclerosis/calcification is present, without any evidence of aortic stenosis. Conclusion(s)/Recommendation(s): EF hard to assess in setting of rapid AF. FINDINGS  Left Ventricle: Left ventricular ejection fraction, by estimation, is 40 to 45%. The left ventricle has mildly decreased function. The left ventricle demonstrates global hypokinesis. There is mild concentric left ventricular hypertrophy. Left ventricular diastolic function could not be evaluated. Left ventricular diastolic function could not be evaluated due to atrial fibrillation. Right Ventricle: Right ventricular systolic function is moderately reduced. Left Atrium: Left atrial size was moderately dilated. Mitral Valve: The mitral valve is degenerative in appearance. Severe mitral annular calcification. Trivial mitral valve regurgitation. Tricuspid Valve: Tricuspid valve regurgitation is trivial. Aortic Valve: The aortic valve is tricuspid. There is mild calcification of the aortic valve. Aortic valve sclerosis/calcification is present, without any evidence of aortic stenosis. Pulmonic Valve: The pulmonic valve was not well visualized. Venous: The inferior vena cava was not well visualized. LEFT VENTRICLE PLAX 2D LVIDd:         4.00 cm LVIDs:         3.30 cm LV PW:         1.30 cm LV IVS:        1.30 cm LVOT diam:     2.30 cm LVOT Area:     4.15 cm  LEFT ATRIUM         Index LA diam:    4.30 cm 2.34  cm/m   AORTA Ao Root diam: 3.60 cm  SHUNTS Systemic Diam: 2.30 cm Toribio Fuel MD Electronically signed by Toribio Fuel MD Signature Date/Time: 11/27/2024/1:22:06 PM    Final    DG ERCP Result Date: 11/27/2024 CLINICAL DATA:  Bile duct obstruction. EXAM: ERCP 50 right upper quadrant fluoroscopic images submitted for interpretation TECHNIQUE: Multiple spot images obtained with the fluoroscopic device and submitted for interpretation post-procedure. FLUOROSCOPY: Radiation Exposure Index (as provided by the fluoroscopic device): 242.43 mGy Kerma COMPARISON:  None Available. FINDINGS: Right upper quadrant images demonstrate a flexible endoscopy device with guidewires advancing into the pancreatic duct and common bile duct. Distal and mid common bile duct narrowing with proximal dilatation of the common bile duct and intrahepatic ducts. The common bile duct is mildly tortuous. Small filling defects identified near the ampulla on multiple initial images. Intra procedural placement of plastic common bile duct stent. IMPRESSION: Focal mid and distal stenosis of the common bile duct and interval placement of a plastic biliary stent. These images were submitted for radiologic interpretation only. Please see the procedural report for the amount of contrast and the fluoroscopy time utilized. Electronically Signed   By: Cordella Banner   On: 11/27/2024 09:03   US  RENAL Result Date: 11/25/2024 EXAM: US  Retroperitoneum Complete, Renal. 11/25/2024 03:04:13 PM TECHNIQUE: Real-time ultrasonography of the retroperitoneum renal was performed. COMPARISON: US  Renal 01/22/2020. CLINICAL HISTORY: Acute renal failure superimposed on stage 3 chronic kidney disease, unspecified acute renal failure type, unspecified whether stage 3a or 3b CKD. FINDINGS: LIMITATIONS: There is limited visualization of the kidney secondary to bandage. RIGHT KIDNEY/URETER: Right kidney measures 9.2 x 4.9 x 4.8 cm. Normal cortical echogenicity. No  hydronephrosis. No calculus. No mass. LEFT KIDNEY/URETER: Left kidney measures 9.5 x 5.9 x 5.1 cm. Normal cortical echogenicity. No hydronephrosis. No calculus. No mass. BLADDER: Foley catheter decompresses the bladder. IMPRESSION: 1.  Limited visualization of the kidneys due to overlying bandage. No hydronephrosis. Electronically signed by: Greig Pique MD 11/25/2024 08:01 PM EST RP Workstation: [id number]   IR Perc Cholecystostomy Result Date: 11/25/2024 INDICATION: 68 year old male with concern for acute cholecystitis. EXAM: Fluoroscopic and ultrasound-guided percutaneous cholecystostomy tube placement MEDICATIONS: The patient was currently receiving intravenous antibiotics as an inpatient, no additional antibiotics were administered. ANESTHESIA/SEDATION: Moderate (conscious) sedation was employed during this procedure. A total of Versed  2 mg and Fentanyl  100 mcg was administered intravenously. Moderate Sedation Time: 6 minutes. The patient's level of consciousness and vital signs were monitored continuously by radiology nursing throughout the procedure under my direct supervision. FLUOROSCOPY TIME:  One mGy reference air kerma COMPLICATIONS: None immediate. PROCEDURE: Informed written consent was obtained from the patient after a thorough discussion of the procedural risks, benefits and alternatives. All questions were addressed. Maximal Sterile Barrier Technique was utilized including caps, mask, sterile gowns, sterile gloves, sterile drape, hand hygiene and skin antiseptic. A timeout was performed prior to the initiation of the procedure. The patient was placed supine on the angiographic table. The patient's right upper quadrant was then prepped and draped in normal sterile fashion with maximum sterile barrier. Ultrasound demonstrates a distended gallbladder. Subdermal Local anesthesia was provided at the planned skin entry site. Under ultrasound guidance, deeper local anesthetic was provided through  intercostal muscles and along the liver capsule. Ultrasound was used to puncture the gallbladder using a 18 gauge trocar needle via a subcostal, transabdominal approach. A 0.035 inch exchange wire was placed in the tract was dilated. A 10 French multipurpose drainage catheter was advanced into the gallbladder lumen. Approximately 100 cc of bilious aspirate was a vacuo aided. Contrast injection demonstrated nondistended gallbladder without visualization of the cystic duct. The drain was then secured in place using a 0-silk suture and a Stayfix device. A sterile dressing was applied. The tube was placed to bulb suction. A culture was sent to the lab for analysis. The patient tolerated procedure well without evidence of immediate complication was transferred back to the floor in stable condition. IMPRESSION: Successful placement of percutaneous, transabdominal 10 French cholecystostomy tube. Ester Sides, MD [facility] Electronically Signed   By: Ester Sides M.D.   On: 11/25/2024 07:27   ECHOCARDIOGRAM LIMITED Result Date: 11/23/2024    ECHOCARDIOGRAM LIMITED REPORT   Patient Name:   KEVYN BOQUET Date of Exam: 11/23/2024 Medical Rec #:  [id number]     Height:       70.0 in Accession #:    [id number]    Weight:       148.8 lb Date of Birth:  [birth year]     BSA:          1.841 m Patient Age:    68 years      BP:           131/69 mmHg Patient Gender: M             HR:           59 bpm. Exam Location:  Inpatient Procedure: Limited Echo (Both Spectral and Color Flow Doppler were utilized            during procedure). Indications:    R94.31 Abnormal EKG  History:        Patient has prior history of Echocardiogram examinations, most                 recent 11/16/2024. CAD.  Sonographer:  Nathanel Devonshire Referring Phys: [id number] LAUREN E AUTRY IMPRESSIONS  1. Limited echo with limited images  2. Left ventricular ejection fraction, by estimation, is 50 to 55%. Left  ventricular ejection fraction by 2D MOD biplane is 52.0 %. The left ventricle has low normal function. The left ventricle demonstrates global hypokinesis. There is mild left ventricular hypertrophy. Left ventricular diastolic parameters are consistent with Grade III diastolic dysfunction (restrictive). Elevated left ventricular end-diastolic pressure.  3. The mitral valve is degenerative. Trivial mitral valve regurgitation.  4. The inferior vena cava is normal in size with <50% respiratory variability, suggesting right atrial pressure of 8 mmHg. Comparison(s): Changes from prior study are noted. 11/16/2024: LVEF 45-50%, grade III DD. FINDINGS  Left Ventricle: Left ventricular ejection fraction, by estimation, is 50 to 55%. Left ventricular ejection fraction by 2D MOD biplane is 52.0 %. The left ventricle has low normal function. The left ventricle demonstrates global hypokinesis. There is mild left ventricular hypertrophy. Left ventricular diastolic parameters are consistent with Grade III diastolic dysfunction (restrictive). Elevated left ventricular end-diastolic pressure. Mitral Valve: The mitral valve is degenerative in appearance. Mild to moderate mitral annular calcification. Trivial mitral valve regurgitation. Venous: The inferior vena cava is normal in size with less than 50% respiratory variability, suggesting right atrial pressure of 8 mmHg.  LV Volumes (MOD)               Biplane EF (MOD) LV vol d, MOD    131.0 ml      LV Biplane EF:   Left A2C:                                            ventricular LV vol d, MOD    145.0 ml                       ejection A4C:                                            fraction by LV vol s, MOD    60.3 ml                        2D MOD A2C:                                            biplane is LV vol s, MOD    73.1 ml                        52.0 %. A4C: LV SV MOD A2C:   70.7 ml       Diastology LV SV MOD A4C:   145.0 ml      LV e' medial:   3.48 cm/s LV SV MOD BP:    73.1  ml       LV E/e' medial: 34.8  MITRAL VALVE MV Area (PHT): 4.63 cm MV Decel Time: 164 msec MV E velocity: 121.00 cm/s MV A velocity: 51.90 cm/s MV E/A ratio:  2.33 Vinie Maxcy MD Electronically signed by Vinie Maxcy MD Signature Date/Time: 11/23/2024/12:26:13 PM  Final    DG C-Arm 1-60 Min-No Report Result Date: 11/22/2024 Fluoroscopy was utilized by the requesting physician.  No radiographic interpretation.   DG C-Arm 1-60 Min-No Report Result Date: 11/22/2024 Fluoroscopy was utilized by the requesting physician.  No radiographic interpretation.   NM Hepatobiliary Liver Func Result Date: 11/20/2024 EXAM: NM HEPATOBILLARY SCAN 11/20/2024 04:18:31 PM TECHNIQUE: RADIOPHARMACEUTICAL: 7.5 mCi Tc-68m mebrofenin  Dynamic images of the abdomen and pelvis were obtained in the anterior projection for 90 minutes after intravenous administration of radiopharmaceutical. COMPARISON: MRI 11/19/2024. CLINICAL HISTORY: Cholecystitis. FINDINGS: There is uniform uptake within the liver. No activity is present within the common bile duct over the entirety of the exam. The gallbladder cannot be assessed without filling of the common duct. Findings concerning for distal common bile duct obstruction in light of the prior day MRCP. IMPRESSION: 1. Findings concerning for distal common bile duct obstruction in light of the prior day MRCP. Electronically signed by: Norleen Boxer MD 11/20/2024 04:26 PM EST RP Workstation: [id number]   MR ABDOMEN MRCP WO CONTRAST Result Date: 11/19/2024 CLINICAL DATA:  Abdominal pain. Gallbladder wall thickening and biliary ductal dilatation on recent ultrasound. EXAM: MRI ABDOMEN WITHOUT CONTRAST  (INCLUDING MRCP) TECHNIQUE: Multiplanar multisequence MR imaging of the abdomen was performed. Heavily T2-weighted images of the biliary and pancreatic ducts were obtained, and three-dimensional MRCP images were rendered by post processing. COMPARISON:  None Available. FINDINGS: Lower chest: No  acute findings. Hepatobiliary:  No masses visualized on this unenhanced exam. The gallbladder is mildly distended and shows mild diffuse wall thickening. No definite gallstones are seen. Pericholecystic edema is seen but is similar to diffuse mesenteric, retroperitoneal, and body wall edema seen throughout the abdomen. Diffuse biliary ductal dilatation is seen, with common bile duct measuring 14 mm in diameter. No evidence of choledocholithiasis. Stricture of the distal common bile duct is seen within the pancreatic head. Pancreas: Evaluation is limited by lack of intravenous contrast and some motion artifact. Multiple tiny cystic foci are seen within the pancreatic head at the site of distal common bile duct stricture. This may be due to chronic pancreatitis, however, pancreatic mass cannot definitely be excluded. No No evidence of main pancreatic ductal dilatation. Spleen:  Within normal limits in size. Adrenals/Urinary tract: Unremarkable. No evidence of nephrolithiasis or hydronephrosis. Stomach/Bowel: No dilated bowel loops. Diffuse mesenteric edema and minimal perihepatic ascites. Vascular/Lymphatic: No pathologically enlarged lymph nodes identified. No evidence of abdominal aortic aneurysm. Other:  None. Musculoskeletal:  No suspicious bone lesions identified. 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 No evidence of choledocholithiasis. Multiple tiny cystic foci within the pancreatic head at the site of distal common bile duct stricture. This may be due to chronic pancreatitis, however, pancreatic mass cannot definitely be excluded on this unenhanced exam. Recommend further imaging evaluation with pancreatic protocol abdomen CT without and with contrast Distended gallbladder with mild diffuse wall thickening, which is nonspecific. Differential diagnosis includes cholecystitis and other etiologies such as hypoalbuminemia, liver disease, or heart failure. Anasarca  and minimal perihepatic ascites. Electronically Signed   By: Norleen DELENA Kil M.D.   On: 11/19/2024 17:48   MR 3D Recon At Scanner Result Date: 11/19/2024 CLINICAL DATA:  Abdominal pain. Gallbladder wall thickening and biliary ductal dilatation on recent ultrasound. EXAM: MRI ABDOMEN WITHOUT CONTRAST  (INCLUDING MRCP) TECHNIQUE: Multiplanar multisequence MR imaging of the abdomen was performed. Heavily T2-weighted images of the biliary and pancreatic ducts were obtained, and three-dimensional MRCP images were rendered by post processing. COMPARISON:  None Available. FINDINGS: Lower chest: No acute findings. Hepatobiliary:  No masses visualized on this unenhanced exam. The gallbladder is mildly distended and shows mild diffuse wall thickening. No definite gallstones are seen. Pericholecystic edema is seen but is similar to diffuse mesenteric, retroperitoneal, and body wall edema seen throughout the abdomen. Diffuse biliary ductal dilatation is seen, with common bile duct measuring 14 mm in diameter. No evidence of choledocholithiasis. Stricture of the distal common bile duct is seen within the pancreatic head. Pancreas: Evaluation is limited by lack of intravenous contrast and some motion artifact. Multiple tiny cystic foci are seen within the pancreatic head at the site of distal common bile duct stricture. This may be due to chronic pancreatitis, however, pancreatic mass cannot definitely be excluded. No No evidence of main pancreatic ductal dilatation. Spleen:  Within normal limits in size. Adrenals/Urinary tract: Unremarkable. No evidence of nephrolithiasis or hydronephrosis. Stomach/Bowel: No dilated bowel loops. Diffuse mesenteric edema and minimal perihepatic ascites. Vascular/Lymphatic: No pathologically enlarged lymph nodes identified. No evidence of abdominal aortic aneurysm. Other:  None. Musculoskeletal:  No suspicious bone lesions identified. IMPRESSION: Diffuse biliary ductal dilatation due to distal  common bile duct stricture. No evidence of choledocholithiasis. Multiple tiny cystic foci within the pancreatic head at the site of distal common bile duct stricture. This may be due to chronic pancreatitis, however, pancreatic mass cannot definitely be excluded on this unenhanced exam. Recommend further imaging evaluation with pancreatic protocol abdomen CT without and with contrast Distended gallbladder with mild diffuse wall thickening, which is nonspecific. Differential diagnosis includes cholecystitis and other etiologies such as hypoalbuminemia, liver disease, or heart failure. Anasarca and minimal perihepatic ascites. Electronically Signed   By: Norleen DELENA Kil M.D.   On: 11/19/2024 17:48   US  Abdomen Limited RUQ (LIVER/GB) Result Date: 11/19/2024 EXAM: Right Upper Quadrant Abdominal Ultrasound 11/19/2024 11:48:11 AM TECHNIQUE: Real-time ultrasonography of the right upper quadrant of the abdomen was performed. COMPARISON: CT of the abdomen and pelvis dated 11/19/2024. CLINICAL HISTORY: Cholecystitis. FINDINGS: LIVER: The liver demonstrates normal echogenicity. No intrahepatic biliary ductal dilatation. No evidence of mass. BILIARY SYSTEM: Gallbladder wall thickness measures 7 mm. The patient is not focally tender over the gallbladder. No pericholecystic fluid. No cholelithiasis. The common bile duct is abnormally dilated, measuring 13.5 mm. No intrahepatic biliary ductal dilatation. OTHER: No right upper quadrant ascites. IMPRESSION: 1. Gallbladder wall thickening (7 mm) without focal tenderness. 2. Dilated common bile duct (13.5 mm) without intrahepatic biliary ductal dilatation. Electronically signed by: Evalene Coho MD 11/19/2024 12:10 PM EST RP Workstation: [id number]   CT ABDOMEN PELVIS WO CONTRAST Result Date: 11/19/2024 CLINICAL DATA:  Abdominal pain. EXAM: CT ABDOMEN AND PELVIS WITHOUT CONTRAST TECHNIQUE: Multidetector CT imaging of the abdomen and pelvis was performed following the standard  protocol without IV contrast. RADIATION DOSE REDUCTION: This exam was performed according to the departmental dose-optimization program which includes automated exposure control, adjustment of the mA and/or kV according to patient size and/or use of iterative reconstruction technique. COMPARISON:  06/20/2013 FINDINGS: Lower chest: Dependent atelectasis. Hepatobiliary: No suspicious focal abnormality in the liver on this study without intravenous contrast. Gallbladder lumen is filled with high density material. This is a new finding since chest CTA 11/15/2024 and likely reflects vicarious excretion of intravenous contrast administered for that study. Gallbladder wall appears circumferentially thickened. Intrahepatic biliary duct dilatation evident. Common bile duct not well demonstrated on this noncontrast exam. Pancreas: No focal mass lesion. No dilatation of the main duct. No intraparenchymal cyst. No peripancreatic edema. Spleen:  No splenomegaly. No suspicious focal mass lesion. Adrenals/Urinary Tract: No right adrenal nodule or mass. Soft tissue nodularity in the expected location of the left adrenal gland may reflect nodular thickening, small lymph nodes, or vascular anatomy. This is not well evaluated given lack of intravenous contrast material. Both kidneys are heterogeneous in appearance. Probable punctate stones bilaterally. No hydronephrosis. No hydroureter. The urinary bladder appears normal for the degree of distention. Stomach/Bowel: Stomach is distended with fluid. Duodenum is normally positioned as is the ligament of Treitz. No small bowel wall thickening. No small bowel dilatation. The terminal ileum is normal. The appendix is not well visualized, but there is no edema or inflammation in the region of the cecal tip to suggest appendicitis. No gross colonic mass. No colonic wall thickening. Vascular/Lymphatic: There is advanced atherosclerotic calcification of the abdominal aorta without aneurysm.  Possible borderline lymphadenopathy in the left para-aortic space. No pelvic sidewall lymphadenopathy. Reproductive: The prostate gland and seminal vesicles are unremarkable. Other: Small volume confluent edema is seen in the right upper quadrant of the abdomen adjacent to the gallbladder. Musculoskeletal: No worrisome lytic or sclerotic osseous abnormality. IMPRESSION: 1. Gallbladder lumen is filled with high density material, likely vicarious excretion of intravenous contrast administered for chest CTA 11/15/2024. Gallbladder wall appears circumferentially thickened with small volume confluent edema in the right upper quadrant of the abdomen adjacent to the gallbladder. Imaging features raise the question of acute cholecystitis. Right upper quadrant ultrasound may prove helpful. 2. Intrahepatic biliary duct dilatation. Common bile duct not well demonstrated on this noncontrast exam. Correlation with liver function test recommended. 3. Soft tissue nodularity in the expected location of the left adrenal gland may reflect nodular thickening, small lymph nodes, or vascular anatomy. This is not well evaluated given lack of intravenous contrast material. 4. Possible borderline lymphadenopathy in the left para-aortic space. 5.  Aortic Atherosclerosis (ICD10-I70.0). Electronically Signed   By: Camellia Candle M.D.   On: 11/19/2024 10:44   ECHOCARDIOGRAM COMPLETE Result Date: 11/16/2024    ECHOCARDIOGRAM REPORT   Patient Name:   KWESI SANGHA Date of Exam: 11/16/2024 Medical Rec #:  [id number]     Height:       70.0 in Accession #:    [id number]    Weight:       154.0 lb Date of Birth:  [birth year]     BSA:          1.868 m Patient Age:    68 years      BP:           140/67 mmHg Patient Gender: M             HR:           93 bpm. Exam Location:  Inpatient Procedure: 2D Echo, 3D Echo, Color Doppler, Cardiac Doppler and Strain Analysis            (Both Spectral and Color Flow Doppler were utilized during            procedure).  Indications:    Pulmonary Embolus  History:        Patient has prior history of Echocardiogram examinations, most                 recent 10/08/2018. CHF; Risk Factors:Current Smoker,                 Hypertension, Diabetes and Dyslipidemia.  Sonographer:    Logan Shove RDCS Referring Phys: [phone] NILDA HERO [facility] IMPRESSIONS  1. Left ventricular ejection fraction, by estimation, is 45 to 50%. Left ventricular ejection fraction by 3D volume is 45 %. The left ventricle has mildly decreased function. The left ventricle demonstrates global hypokinesis. Left ventricular diastolic  parameters are consistent with Grade III diastolic dysfunction (restrictive). Elevated left atrial pressure. The average left ventricular global longitudinal strain is -15.3 %. The global longitudinal strain is abnormal.  2. Right ventricular systolic function is normal. The right ventricular size is moderately enlarged. Tricuspid regurgitation signal is inadequate for assessing PA pressure.  3. Left atrial size was severely dilated.  4. The mitral valve is normal in structure. Trivial mitral valve regurgitation. No evidence of mitral stenosis.  5. The aortic valve is normal in structure. Aortic valve regurgitation is not visualized. No aortic stenosis is present.  6. The inferior vena cava is dilated in size with >50% respiratory variability, suggesting right atrial pressure of 8 mmHg.  7. There is a long serpiginous density next to the oriface of the IVC into the RA. Review of this area on prior CTA shows no evidcene of thrombus. This likely represents a prominent eustacion valve. FINDINGS  Left Ventricle: Left ventricular ejection fraction, by estimation, is 45 to 50%. Left ventricular ejection fraction by 3D volume is 45 %. The left ventricle has mildly decreased function. The left ventricle demonstrates global hypokinesis. The average left ventricular global longitudinal strain is -15.3 %. Strain was performed and the global longitudinal strain  is abnormal. The left ventricular internal cavity size was normal in size. There is no left ventricular hypertrophy. Left ventricular diastolic parameters are consistent with Grade III diastolic dysfunction (restrictive). Elevated left atrial pressure. Right Ventricle: The right ventricular size is moderately enlarged. No increase in right ventricular wall thickness. Right ventricular systolic function is normal. Tricuspid regurgitation signal is inadequate for assessing PA pressure. Left Atrium: Left atrial size was severely dilated. Right Atrium: There is a long serpiginous density next to the oriface of the IVC into the RA. Review of this area on prior CTA shows no evidcene of thrombus. This likely represents a prominent eustacion valve. Right atrial size was normal in size. Pericardium: There is no evidence of pericardial effusion. Mitral Valve: The mitral valve is normal in structure. Mild to moderate mitral annular calcification. Trivial mitral valve regurgitation. No evidence of mitral valve stenosis. Tricuspid Valve: The tricuspid valve is normal in structure. Tricuspid valve regurgitation is not demonstrated. No evidence of tricuspid stenosis. Aortic Valve: The aortic valve is normal in structure. Aortic valve regurgitation is not visualized. No aortic stenosis is present. Aortic valve peak gradient measures 3.1 mmHg. Pulmonic Valve: The pulmonic valve was normal in structure. Pulmonic valve regurgitation is not visualized. No evidence of pulmonic stenosis. Aorta: The aortic root is normal in size and structure. Venous: The inferior vena cava is dilated in size with greater than 50% respiratory variability, suggesting right atrial pressure of 8 mmHg. IAS/Shunts: No atrial level shunt detected by color flow Doppler. Additional Comments: 3D was performed not requiring image post processing on an independent workstation and was abnormal.  LEFT VENTRICLE PLAX 2D LVIDd:         5.00 cm         Diastology LVIDs:          3.80 cm         LV e' medial:    5.44 cm/s LV PW:         1.00 cm  LV E/e' medial:  20.0 LV IVS:        1.10 cm         LV e' lateral:   9.03 cm/s LVOT diam:     2.00 cm         LV E/e' lateral: 12.1 LVOT Area:     3.14 cm LV IVRT:       137 msec        2D Longitudinal                                Strain                                2D Strain GLS   -15.0 %                                (A4C):                                2D Strain GLS   -15.7 %                                (A3C):                                2D Strain GLS   -15.3 %                                (A2C):                                2D Strain GLS   -15.3 %                                Avg:                                 3D Volume EF                                LV 3D EF:    Left                                             ventricul                                             ar                                             ejection  fraction                                             by 3D                                             volume is                                             45 %.                                 3D Volume EF:                                3D EF:        45 %                                LV EDV:       162 ml                                LV ESV:       89 ml                                LV SV:        74 ml RIGHT VENTRICLE            IVC RV Basal diam:  4.50 cm    IVC diam: 2.30 cm RV Mid diam:    3.50 cm RV S prime:     8.59 cm/s  PULMONARY VEINS TAPSE (M-mode): 1.1 cm     Diastolic Velocity: 44.20 cm/s                            S/D Velocity:       0.70                            Systolic Velocity:  31.50 cm/s LEFT ATRIUM              Index         RIGHT ATRIUM           Index LA diam:        5.20 cm  2.78 cm/m    RA Area:     20.30 cm LA Vol (A2C):   193.0 ml 103.33 ml/m  RA Volume:   57.10 ml  30.57 ml/m LA Vol (A4C):   98.8 ml  52.89 ml/m  LA Biplane Vol: 143.0 ml 76.56 ml/m  AORTIC VALVE AV Area (Vmax): 2.58 cm AV Vmax:        88.50 cm/s AV Peak Grad:   3.1 mmHg LVOT Vmax:      72.60  cm/s  AORTA Ao Root diam: 3.20 cm Ao Asc diam:  3.20 cm MITRAL VALVE MV Area (PHT): 4.31 cm     SHUNTS MV Decel Time: 176 msec     Systemic Diam: 2.00 cm MV E velocity: 109.00 cm/s MV A velocity: 30.50 cm/s MV E/A ratio:  3.57 Wilbert Bihari MD Electronically signed by Wilbert Bihari MD Signature Date/Time: 11/16/2024/3:52:00 PM    Final    CT Angio Chest PE W and/or Wo Contrast Result Date: 11/15/2024 EXAM: CTA of the Chest with contrast for PE 11/15/2024 07:49:11 PM TECHNIQUE: CTA of the chest was performed after the administration of 60 mL of iohexol  (OMNIPAQUE ) 350 MG/ML injection. Multiplanar reformatted images are provided for review. MIP images are provided for review. Automated exposure control, iterative reconstruction, and/or weight based adjustment of the mA/kV was utilized to reduce the radiation dose to as low as reasonably achievable. COMPARISON: None available. CLINICAL HISTORY: sob w/ + d dimer FINDINGS: PULMONARY ARTERIES: Pulmonary arteries are adequately opacified for evaluation. A faint nonocclusive filling defect is present in the posterior basilar segmental artery of the left lower lobe. No other definite emboli are present. Main pulmonary artery is normal in caliber. MEDIASTINUM: The heart and pericardium demonstrate no acute abnormality. Dense atherosclerotic calcifications are present in the coronary arteries. Atherosclerotic changes are present in the aortic arch and great vessel origins. 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 There is no acute abnormality of the thoracic aorta. LYMPH NODES: No mediastinal, hilar or axillary lymphadenopathy. LUNGS AND PLEURA: A moderate left pleural effusion is present. A small right pleural effusion is present. Mild dependent atelectasis is present bilaterally. A 5 mm  nodule in the left upper lobe is best visualized on image 49 of series 6. Punctate nodules are present in the left lower lobe. An 8 mm nodule is present within the superior segment of the right lower lobe on image 65 of series 7. No pneumothorax. UPPER ABDOMEN: Limited images of the upper abdomen are unremarkable. SOFT TISSUES AND BONES: No acute bone or soft tissue abnormality. IMPRESSION: 1. Faint nonocclusive filling defect in the posterior basilar segmental artery of the left lower lobe, consistent with a small pulmonary embolism. No other definite emboli are present. 2. 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 3. Moderate left pleural effusion and small right pleural effusion. 4. Pulmonary nodules including a 5 mm left upper lobe nodule and an 8 mm right lower lobe nodule. For the 8 mm solid nodule, recommend non-contrast chest CT at 36 months; if high risk for malignancy, consider PET/CT or tissue sampling per Fleischner Society Guidelines. 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 CLINICAL HISTORY: SOB. FINDINGS: LUNGS AND PLEURA: No focal pulmonary opacity. No pleural effusion. No pneumothorax. HEART AND MEDIASTINUM: No acute abnormality of the cardiac and mediastinal silhouettes. BONES AND SOFT TISSUES: No acute osseous abnormality. IMPRESSION: 1. No acute cardiopulmonary process. Electronically signed by: [name] Seip MD 11/15/2024 06:13 PM EST RP Workstation: [id number]    "

## 2024-12-05 NOTE — Assessment & Plan Note (Addendum)
 BPH: Continue home tamsulosin  GERD: Continue home Protonix  HFrEF: LVEF 40 to 45%, moderately reduced RV function, severe MAC

## 2024-12-05 NOTE — Assessment & Plan Note (Addendum)
-   Continue tetracaine  0.5% ophthalmic anesthetic eye drops - Continue protective eye patch

## 2024-12-05 NOTE — Assessment & Plan Note (Addendum)
 CBGs labile over the past 24 hours, with the lowest being 47 and highest being 342.  Required repeat dextrose  administrations yesterday.  Caution with excess LAI today, AM CBG 310.  - Lantus  10 units daily - Will consider TID dosing with SAI instead of Lantus , if CBGs continue to be labile - Continue sensitive SSI - Hold home metformin 

## 2024-12-06 ENCOUNTER — Other Ambulatory Visit (HOSPITAL_COMMUNITY): Payer: Self-pay

## 2024-12-06 DIAGNOSIS — I480 Paroxysmal atrial fibrillation: Secondary | ICD-10-CM

## 2024-12-06 LAB — COMPREHENSIVE METABOLIC PANEL WITH GFR
ALT: 35 U/L (ref 0–44)
AST: 38 U/L (ref 15–41)
Albumin: 2.8 g/dL — ABNORMAL LOW (ref 3.5–5.0)
Alkaline Phosphatase: 462 U/L — ABNORMAL HIGH (ref 38–126)
Anion gap: 14 (ref 5–15)
BUN: 78 mg/dL — ABNORMAL HIGH (ref 8–23)
CO2: 20 mmol/L — ABNORMAL LOW (ref 22–32)
Calcium: 8.8 mg/dL — ABNORMAL LOW (ref 8.9–10.3)
Chloride: 98 mmol/L (ref 98–111)
Creatinine, Ser: 3.65 mg/dL — ABNORMAL HIGH (ref 0.61–1.24)
GFR, Estimated: 17 mL/min — ABNORMAL LOW
Glucose, Bld: 300 mg/dL — ABNORMAL HIGH (ref 70–99)
Potassium: 5 mmol/L (ref 3.5–5.1)
Sodium: 131 mmol/L — ABNORMAL LOW (ref 135–145)
Total Bilirubin: 0.7 mg/dL (ref 0.0–1.2)
Total Protein: 5.7 g/dL — ABNORMAL LOW (ref 6.5–8.1)

## 2024-12-06 LAB — CBC
HCT: 23.4 % — ABNORMAL LOW (ref 39.0–52.0)
Hemoglobin: 7.8 g/dL — ABNORMAL LOW (ref 13.0–17.0)
MCH: 31.2 pg (ref 26.0–34.0)
MCHC: 33.3 g/dL (ref 30.0–36.0)
MCV: 93.6 fL (ref 80.0–100.0)
Platelets: 350 K/uL (ref 150–400)
RBC: 2.5 MIL/uL — ABNORMAL LOW (ref 4.22–5.81)
RDW: 15.1 % (ref 11.5–15.5)
WBC: 13.8 K/uL — ABNORMAL HIGH (ref 4.0–10.5)
nRBC: 0 % (ref 0.0–0.2)

## 2024-12-06 LAB — GLUCOSE, CAPILLARY
Glucose-Capillary: 223 mg/dL — ABNORMAL HIGH (ref 70–99)
Glucose-Capillary: 248 mg/dL — ABNORMAL HIGH (ref 70–99)
Glucose-Capillary: 290 mg/dL — ABNORMAL HIGH (ref 70–99)
Glucose-Capillary: 312 mg/dL — ABNORMAL HIGH (ref 70–99)

## 2024-12-06 LAB — MAGNESIUM: Magnesium: 3 mg/dL — ABNORMAL HIGH (ref 1.7–2.4)

## 2024-12-06 LAB — HEMOGLOBIN AND HEMATOCRIT, BLOOD
HCT: 23.4 % — ABNORMAL LOW (ref 39.0–52.0)
Hemoglobin: 7.7 g/dL — ABNORMAL LOW (ref 13.0–17.0)

## 2024-12-06 MED ORDER — INSULIN ASPART 100 UNIT/ML IJ SOLN
0.0000 [IU] | Freq: Three times a day (TID) | INTRAMUSCULAR | Status: DC
  Administered 2024-12-06: 4 [IU] via SUBCUTANEOUS
  Filled 2024-12-06: qty 4

## 2024-12-06 MED ORDER — POLYETHYLENE GLYCOL 3350 17 G PO PACK: 17.0000 g | PACK | Freq: Every day | ORAL | Status: DC

## 2024-12-06 MED ORDER — INSULIN LISPRO 100 UNIT/ML IJ SOLN
0.0000 [IU] | Freq: Three times a day (TID) | INTRAMUSCULAR | 5 refills | Status: DC
  Filled 2024-12-06: qty 10, 67d supply, fill #0

## 2024-12-06 MED ORDER — ISOSORBIDE MONONITRATE ER 60 MG PO TB24
60.0000 mg | ORAL_TABLET | Freq: Every day | ORAL | 0 refills | Status: DC
  Filled 2024-12-06: qty 30, 30d supply, fill #0

## 2024-12-06 MED ORDER — SENNA 8.6 MG PO TABS
2.0000 | ORAL_TABLET | Freq: Every day | ORAL | 0 refills | Status: DC
  Filled 2024-12-06: qty 120, 60d supply, fill #0

## 2024-12-06 MED ORDER — OXYCODONE HCL 10 MG PO TABS
10.0000 mg | ORAL_TABLET | Freq: Four times a day (QID) | ORAL | 0 refills | Status: DC | PRN
  Filled 2024-12-06: qty 90, 23d supply, fill #0

## 2024-12-06 MED ORDER — AMIODARONE HCL 200 MG PO TABS
ORAL_TABLET | ORAL | 0 refills | Status: DC
  Filled 2024-12-06: qty 36, 30d supply, fill #0

## 2024-12-06 MED ORDER — CARVEDILOL 12.5 MG PO TABS
12.5000 mg | ORAL_TABLET | Freq: Two times a day (BID) | ORAL | 0 refills | Status: DC
  Filled 2024-12-06: qty 60, 30d supply, fill #0

## 2024-12-06 MED ORDER — FUROSEMIDE 40 MG PO TABS
20.0000 mg | ORAL_TABLET | ORAL | 0 refills | Status: DC | PRN
  Filled 2024-12-06: qty 30, 60d supply, fill #0

## 2024-12-06 MED ORDER — SENNA 8.6 MG PO TABS: 2.0000 | ORAL_TABLET | Freq: Every day | ORAL | Status: DC

## 2024-12-06 MED ORDER — BASAGLAR KWIKPEN 100 UNIT/ML ~~LOC~~ SOPN
5.0000 [IU] | PEN_INJECTOR | Freq: Every day | SUBCUTANEOUS | 5 refills | Status: DC
  Filled 2024-12-06: qty 3, 28d supply, fill #0

## 2024-12-06 MED ORDER — POLYETHYLENE GLYCOL 3350 17 GM/SCOOP PO POWD
17.0000 g | Freq: Every day | ORAL | 0 refills | Status: DC
  Filled 2024-12-06: qty 238, 14d supply, fill #0

## 2024-12-06 NOTE — Assessment & Plan Note (Deleted)
 No longer in A-fib, rate controlled with HR ~70  - Continue Amiodarone  200 mg BID x1 week (12/25-12/31), followed by 200 mg daily - Continue carvedilol  12.5 mg BID w/ meals - Continue Eliquis  5 mg BID - Continue Imdur  60 mg daily - Continue hydralazine  50 mg PO q8h - Cards consulted, appreciate their recs - AM CMP and Mag

## 2024-12-06 NOTE — Discharge Summary (Addendum)
 "  Family Medicine Teaching [facility] Discharge Summary  Patient name: Colin Keller Medical record number: [id number] Date of birth: 04/30/1956 Age: 68 y.o. Gender: male Date of Admission: 11/19/2024  Date of Discharge: 12/06/24 Admitting Physician: Suzann CHRISTELLA Daring, MD  Primary Care Provider: McDiarmid, Krystal BIRCH, MD Consultants:  Nephro, Cardiology, IR, Gen Surg, GI, Oncology   Indication for Hospitalization: abdominal pain  Discharge Diagnoses/Problem List:  Principal Problem for Admission: Pancreatic adenocarcinoma Other Problems addressed during stay:  Principal Problem:   Pancreatic adenocarcinoma (HCC) Active Problems:   HTN (hypertension)   Multiple lung nodules on CT   AKI (acute kidney injury)   Acute pulmonary embolism (HCC)   T2DM (type 2 diabetes mellitus) (HCC)   Anemia   Coronary artery disease involving native coronary artery of native heart without angina pectoris   Chronic health problem   Abdominal pain   Cholecystitis   Pancreatic lesion   Biliary stricture (HCC)   Abnormal finding on GI tract imaging   Anticoagulated   Elevated liver enzymes   Biliary obstruction (HCC)   Leukocytosis   Biliary sepsis (HCC)   S/P ERCP   Bleeding from sphincterotomy site   Atrial fibrillation with RVR (HCC)   Primary hypertension   Atrial fibrillation with rapid ventricular response (HCC)   Malnutrition of moderate degree   Common bile duct dilation   Eye pain, left   Chronic systolic heart failure (HCC)   Paroxysmal atrial fibrillation [facility])  Brief Hospital Course:  Colin Keller is a 68 y.o.male with a history of recent PE, CHF, T2DM, CAD s/p CABG, HLD, HTN, PVD who was admitted to the family medicine teaching Service at [facility] for acute symptomatic biliary stricture. His hospital course is detailed below:  Patient initially admitted for work up of abdominal pain, but during admission continued to decompensate with the problems listed below.  Complicated hospital  course with many interventions and medication adjustments.  The following hospital summary includes the final medication adjustments made.   Acute cholecystitis  Pancreatic Adenocarcinoma  Biliary stricture Patient recently discharged for small PE.  Returned with intermittent RUQ abdominal pain progressively worsening over the last few days, was found to have CBD dilation and gallbladder wall thickening on CTAP and RUQ US .  Lab work with elevated LFTs, alk phos and bilirubin.  MRCP found diffuse biliary ductal dilatation due to distal CBD stricture and multiple tiny cystic foci within the pancreatic head at the site of distal common bile duct stricture.  Gen surg was consulted and recommended HIDA scan which confirmed CBD stricture and thickened gallbladder wall concerning for acute cholecystitis with intrahepatic biliary duct dilatation.  GI was consulted for management of cholecystitis along with concern for pancreatic mass.  Given concern for intrabdominal infection, patient was treated with ceftriaxone  (12/14 - 12/19) and Flagyl  (12/14 - 12/18).  Patient underwent ERCP with biliary sphincterotomy and CBD stent placement on 12/12.  Postprocedure patient had bradycardia vs junctional rhythm in the 40s requiring atropine  and was admitted to ICU overnight for monitoring.  T. bili and LFTs gradually improved after biliary stent and cholecystostomy tube.  Percutaneous cholecystotomy tube had good output and remained at discharged with plan to follow-up with IR outpatient who will arrange appointment.  EUS 12/18 showing concern for pancreatic adenocarcinoma, core needle biopsies confirmed the same.  Oncology was consulted and discussed chemotherapy with family, Port-A-Cath was placed while admitted with plans of starting chemotherapy 12/17/24.  Atrial Fibrillation Patient went into new Afib w/ RVR  on 12/17, found incidentally as patient was asymptomatic.  Trop and EKG reassuring.  Cardiology was consulted and he  was treated with IV rate control and transitioned to oral Coreg  12.5 mg BID and Amiodarone  200 mg BID for 1 week followed by 200 mg daily thereafter. Diltiazem  discontinued.   PE Patient with recent PE and was on Eliquis  5 mg BID outpatient.  He was treated with IV heparin  inpatient which was held intermittently for procedures and acute blood loss and transitioned back to Eliquis  5 mg BID prior to discharge, with hemoglobin stable at 7.8.   Anemia  Thought to be chronic anemia due to renal disease and some component of GI blood loss.  Patient's hemoglobin was monitored throughout this extended hospital stay.  He had clipping of one bleeding melanotic spot of duodenum next to the sphincterotomy site during initial EUS.  Patient received 2 units of pRBC while admitted, and hemoglobin stable at discharge 7.8.  AKI  Given patients ongoing AKI and nephrotic range proteinuria, nephrology was consulted.  Renal ultrasound was unremarkable.  Autoimmune workup was showed elevated anti-centromere Ab and positive ANA thought to be related to new cancer diagnosis per nephrology.  Home lasix  and hydrochlorothiazide  were held.  Nephrology recommended renal biopsy, however patient and family agreed to forego in light of the new pancreatic cancer diagnosis, nephrology will follow up outpatient.  Elevated stable creatinine at discharge 3.65.   T2DM Patient with labile CBG throughout admission. He had brief hypoglycemic episodes requiring dextrose . Unclear whether related to long acting insulin  vs short acting. Patient was discharged on 5 units long acting and 0-5 units short acting TID with meals with OP f/u scheduled.  Other chronic conditions were medically managed with home medications and formulary alternatives as necessary (T2DM, CHF, CAD, HLD, HTN)  PCP Follow-up Recommendations: F/u diabetes regimen - patient had multiple hypoglycemic episodes. Discharged on 5u long acting insulin  and 0-5u TID short acting  with meals. Ensure appropriate IR follow up for tube (see 12/16 note, needs daily flushes etc)  Follow-up on the ERCP stent exchange in 3 to 4 months if not a surgical candidate  Outpatient PET scan to assess metabolic activity of lung nodules and evaluate for metastatic disease per oncology. Chemotherapy per oncology with follow up imaging as indicated. Repeat CBC Consider OP echo Ensure close follow up with Nephrology Consider restarting HCTZ    Results/Tests Pending at Time of Discharge:  Unresulted Labs (From admission, onward)     Start     Ordered   12/03/24 0500  CBC  Daily,   R     Question:  Specimen collection method  Answer:  Lab=Lab collect   12/02/24 0949             Disposition: Home with home health  Discharge Condition: stable  Discharge Exam:  Vitals:   12/06/24 1026 12/06/24 1103  BP: (!) 153/70 (!) 153/57  Pulse: 76 76  Resp:  (!) 22  Temp:  97.7 F (36.5 C)  SpO2:  98%   General: Alert, well-appearing male in NAD.  Cardiovascular: RRR, no m/r/g appreciated. Pulmonary: Normal WOB. CTAB with no w/c/r present. Abdomen: Soft, non-tender, non-distended. Extremities: Warm and well-perfused, minimal edema in LE Skin: No rashes or lesions. Psych: Appropriate mood and affect  Significant Procedures:  IR IMAGING GUIDED PORT INSERTION 12/04/24  ECHOCARDIOGRAM LIMITED Result Date: 11/27/2024 1. Left ventricular ejection fraction, by estimation, is 40 to 45%. The left ventricle has mildly decreased function. The left ventricle  demonstrates global hypokinesis. There is mild concentric left ventricular hypertrophy. Left ventricular diastolic function could not be evaluated.   2. Right ventricular systolic function is moderately reduced.   3. Left atrial size was moderately dilated.   4. The mitral valve is degenerative. Trivial mitral valve regurgitation. Severe mitral annular calcification.   5. The aortic valve is tricuspid. There is mild  calcification of the aortic valve. Aortic valve sclerosis/calcification is present, without any evidence of aortic stenosis. Conclusion(s)/Recommendation(s): EF hard to assess in setting of rapid AF.   DG ERCP Result Date: 11/27/2024  Focal mid and distal stenosis of the common bile duct and interval placement of a plastic biliary stent. These images were submitted for radiologic interpretation only. Please see the procedural report for the amount of contrast and the fluoroscopy time utilized.   IR Perc Cholecystostomy 11/25/2024  ECHOCARDIOGRAM LIMITED Result Date: 11/23/2024 1. Limited echo with limited images   2. Left ventricular ejection fraction, by estimation, is 50 to 55%. Left ventricular ejection fraction by 2D MOD biplane is 52.0 %. The left ventricle has low normal function. The left ventricle demonstrates global hypokinesis. There is mild left ventricular hypertrophy. Left ventricular diastolic parameters are consistent with Grade III diastolic dysfunction (restrictive). Elevated left ventricular end-diastolic pressure.   3. The mitral valve is degenerative. Trivial mitral valve regurgitation.   4. The inferior vena cava is normal in size with <50% respiratory variability, suggesting right atrial pressure of 8 mmHg.  MR ABDOMEN MRCP WO CONTRAST Result Date: 11/19/2024  Diffuse biliary ductal dilatation due to distal common bile duct stricture. No evidence of choledocholithiasis. Multiple tiny cystic foci within the pancreatic head at the site of distal common bile duct stricture. This may be due to chronic pancreatitis, however, pancreatic mass cannot definitely be excluded on this unenhanced exam. Recommend further imaging evaluation with pancreatic protocol abdomen CT without and with contrast Distended gallbladder with mild diffuse wall thickening, which is nonspecific. Differential diagnosis includes cholecystitis and other etiologies such as hypoalbuminemia, liver disease, or heart  failure. Anasarca and minimal perihepatic ascites.   Significant Labs and Imaging:  Recent Labs  Lab 12/05/24 0358 12/06/24 0738 12/06/24 0958  WBC 10.8* 13.8*  --   HGB 8.5* 7.8* 7.7*  HCT 26.2* 23.4* 23.4*  PLT 347 350  --    Recent Labs  Lab 12/05/24 0358 12/06/24 0738  NA 135 131*  K 4.1 5.0  CL 98 98  CO2 20* 20*  GLUCOSE 266* 300*  BUN 74* 78*  CREATININE 3.40* 3.65*  CALCIUM  8.7* 8.8*  MG  --  3.0*  ALKPHOS 470* 462*  AST 15 38  ALT 25 35  ALBUMIN  2.5* 2.8*    Pertinent Imaging:  DG Abd 1 View Result Date: 12/04/2024 Stable support apparatus. No abnormal bowel dilatation  DG CHEST PORT 1 VIEW Result Date: 11/30/2024 Mild central vascular congestion. No focal consolidation  DG Abd 1 View Result Date: 11/30/2024  1. Biliary stent and right upper quadrant pigtail catheter in place. 2. Moderate colonic stool burden without evidence of bowel obstruction. 3. No acute findings  VAS US  LOWER EXTREMITY VENOUS (DVT) Result Date: 11/28/2024 BILATERAL: - No evidence of deep vein thrombosis seen in the lower extremities, bilaterally. No evidence of popliteal cyst, bilaterally.    US  RENAL Result Date: 11/25/2024  1. Limited visualization of the kidneys due to overlying bandage. No hydronephrosis.   NM Hepatobiliary Liver Func Result Date: 11/20/2024 Findings concerning for distal common bile duct obstruction in  light of the prior day MRCP.   MR 3D Recon At Scanner Result Date: 11/19/2024 Diffuse biliary ductal dilatation due to distal common bile duct stricture. No evidence of choledocholithiasis. Multiple tiny cystic foci within the pancreatic head at the site of distal common bile duct stricture. This may be due to chronic pancreatitis, however, pancreatic mass cannot definitely be excluded on this unenhanced exam. Recommend further imaging evaluation with pancreatic protocol abdomen CT without and with contrast Distended gallbladder with mild diffuse wall  thickening, which is nonspecific. Differential diagnosis includes cholecystitis and other etiologies such as hypoalbuminemia, liver disease, or heart failure. Anasarca and minimal perihepatic ascites.  US  Abdomen Limited RUQ (LIVER/GB) Result Date: 11/19/2024 1. Gallbladder wall thickening (7 mm) without focal tenderness.  2. Dilated common bile duct (13.5 mm) without intrahepatic biliary ductal dilatation.  CT ABDOMEN PELVIS WO CONTRAST Result Date: 11/19/2024 1. Gallbladder lumen is filled with high density material, likely vicarious excretion of intravenous contrast administered for chest CTA 11/15/2024. Gallbladder wall appears circumferentially thickened with small volume confluent edema in the right upper quadrant of the abdomen adjacent to the gallbladder. Imaging features raise the question of acute cholecystitis. Right upper quadrant ultrasound may prove helpful.  2. Intrahepatic biliary duct dilatation. Common bile duct not well demonstrated on this noncontrast exam. Correlation with liver function test recommended.  3. Soft tissue nodularity in the expected location of the left adrenal gland may reflect nodular thickening, small lymph nodes, or vascular anatomy. This is not well evaluated given lack of intravenous contrast material.  4. Possible borderline lymphadenopathy in the left para-aortic space.  5.  Aortic Atherosclerosis (ICD10-I70.0).    Discharge Medications:  Allergies as of 12/06/2024       Reactions   Amlodipine  Other (See Comments)   Leg swelling   Empagliflozin  Other (See Comments)   Euglycemic DKA   Amitriptyline  Other (See Comments)   Urinary retention   Lisinopril  Other (See Comments)   Hyperkalemia   Nortriptyline  Hcl Other (See Comments)   Vivid / bad dreams   Statins    Aches in Joints    Simvastatin  Other (See Comments)   Arthralgias        Medication List     PAUSE taking these medications    hydrochlorothiazide  12.5 MG tablet Wait to take  this until your doctor or other care provider tells you to start again. Commonly known as: HYDRODIURIL  Take 1 tablet (12.5 mg total) by mouth daily.       STOP taking these medications    diltiazem  180 MG 24 hr capsule Commonly known as: CARDIZEM  CD   HYDROcodone -acetaminophen  10-325 MG tablet Commonly known as: NORCO       TAKE these medications    amiodarone  200 MG tablet Commonly known as: PACERONE  Take 1 tablet (200 mg total) by mouth 2 (two) times daily for 6 days, THEN 1 tablet (200 mg total) daily for 24 days. Start taking on: December 06, 2024   aspirin  81 MG tablet Take 81 mg by mouth daily.   Baqsimi  Two Pack 3 MG/DOSE Powd Generic drug: Glucagon  Use as directed PRN low glucose   Basaglar  KwikPen 100 UNIT/ML Inject 5 Units into the skin daily. What changed: See the new instructions.   carvedilol  12.5 MG tablet Commonly known as: COREG  Take 1 tablet (12.5 mg total) by mouth 2 (two) times daily with a meal. What changed:  medication strength how much to take   cetirizine 10 MG chewable tablet Commonly known as: ZYRTEC  Chew 10 mg by mouth as needed for allergies.   Eliquis  5 MG Tabs tablet Generic drug: apixaban  Take 1 tablet (5 mg total) by mouth 2 (two) times daily. To begin after completion of Eliquis  (apixaban ) VTE starter pack Start taking on: December 17, 2024 What changed: Another medication with the same name was removed. Continue taking this medication, and follow the directions you see here.   furosemide  40 MG tablet Commonly known as: LASIX  Take 0.5 tablets (20 mg total) by mouth as needed for fluid or edema (weight gain). What changed:  when to take this reasons to take this   hydrALAZINE  50 MG tablet Commonly known as: APRESOLINE  Take 1 tablet (50 mg total) by mouth 3 (three) times daily.   insulin  lispro 100 UNIT/ML injection Commonly known as: HUMALOG  Inject 0-0.05 mLs (0-5 Units total) into the skin 3 (three) times daily with  meals. 3-4 units prior to breakfast, 4-5 units prior to evening meal What changed: how much to take   isosorbide  mononitrate 60 MG 24 hr tablet Commonly known as: IMDUR  Take 1 tablet (60 mg total) by mouth daily. Start taking on: December 07, 2024   metFORMIN  500 MG tablet Commonly known as: GLUCOPHAGE  Take 2 tablets (1,000 mg total) by mouth 2 (two) times daily with a meal. TAKE 2 TABLETS BY MOUTH TWICE DAILY WITH A MEAL   Oxycodone  HCl 10 MG Tabs Take 1 tablet (10 mg total) by mouth every 6 (six) hours as needed (pain).   pantoprazole  40 MG tablet Commonly known as: PROTONIX  Take 1 tablet (40 mg total) by mouth daily.   polyethylene glycol powder 17 GM/SCOOP powder Commonly known as: GLYCOLAX /MIRALAX  Take 17 g by mouth daily. Dissolve 1 capful (17g) in 4-8 ounces of liquid and take by mouth daily. Start taking on: December 07, 2024   promethazine  25 MG tablet Commonly known as: PHENERGAN  Take 1 tablet (25 mg total) by mouth every 6 (six) hours as needed for nausea or vomiting. What changed: how much to take   Repatha  SureClick 140 MG/ML Soaj Generic drug: Evolocumab  Inject 140 mg into the skin every 14 (fourteen) days.   senna 8.6 MG Tabs tablet Commonly known as: SENOKOT Take 2 tablets (17.2 mg total) by mouth daily. Start taking on: December 07, 2024   tamsulosin  0.4 MG Caps capsule Commonly known as: FLOMAX  Take 1 capsule (0.4 mg total) by mouth daily.        Discharge Instructions: Please refer to Patient Instructions section of EMR for full details.  Patient was counseled important signs and symptoms that should prompt return to medical care, changes in medications, dietary instructions, activity restrictions, and follow up appointments.   Follow-Up Appointments:  Contact information for follow-up providers     Vernetta Berg, MD. Call on 12/03/2024.   Specialty: General Surgery Why: At 11:10PM; Arrive 30 mins prior to scheduled appointment time, Please  bring your ID and insurance cards. Contact information: [address] [place] [address] [phone]         [facility] IR Imaging Follow up.   Specialty: Radiology Why: Schedulers will contact you to arrange follow-up Contact information: [address] [facility] [place]  [address] [phone]             Contact information for after-discharge care     Home Medical Care     [facility]) .   Service: Home Health Services Why: HHRN/PT arranged- they will contact you  to schedule Contact information: [address] [place]  [address] [phone]                     Jerrie Gathers, DO 12/06/2024, 1:20 PM PGY-1, [facility]   FPTS Upper-Level Resident Addendum   I have independently interviewed and examined the patient. I have discussed the above with Dr. Jerrie and agree with the documented plan. My edits for correction/addition/clarification are included above. Please see any attending notes.   Payton Coward, MD PGY-3, [facility] 12/06/2024 3:18 PM  FPTS Service pager: [phone] (text pages welcome through AMION)  "

## 2024-12-06 NOTE — Assessment & Plan Note (Deleted)
 Pancreatic adenocarcinoma following with Heme-Onc. Port-A-Cath placed by IR 12/24, ready to use. Patient reports improvement in constipation with 4-5 loose stools overnight (2 documented) - Pain regimen: oxycodone  5 mg q4h PRN moderate pain OR oxycodone  10 mg q4h PRN for severe pain - Bowel regimen: discontinue Lactulose  10 g TID, senna 17.2 mg BID > daily, Miralax  17g BID > daily

## 2024-12-06 NOTE — Assessment & Plan Note (Deleted)
-   Continue tetracaine  0.5% ophthalmic anesthetic eye drops - Continue protective eye patch

## 2024-12-06 NOTE — Assessment & Plan Note (Deleted)
 BPH: Continue home tamsulosin  GERD: Continue home Protonix  HFrEF: LVEF 40 to 45%, moderately reduced RV function, severe MAC

## 2024-12-06 NOTE — Assessment & Plan Note (Deleted)
 WBC 10.8 > 13.8, alk phos downtrending.  Afebrile, VSS. Perc chole tube in place and draining. GI recommended patient be evaluated by oncology and surgery to determine if he is a surgical candidate since he may need stents placed outpatient in 3 to 4 months. - Continue pantoprazole  40 mg BID - Continue carafate  1g BID - GI and Gen Surg consulted, appreciate recs - Cholecystostomy tube in place. IR consulted, appreciate recommendations - AM CMP

## 2024-12-06 NOTE — Progress Notes (Signed)
 DISCHARGE NOTE HOME Colin Keller to be discharged Home per MD order. Discussed prescriptions and follow up appointments with the patient. Prescriptions given to patient; medication list explained in detail. Patient verbalized understanding.  Skin clean, dry and intact without evidence of skin break down, no evidence of skin tears noted. IV catheter discontinued intact. Site without signs and symptoms of complications. Dressing and pressure applied. Pt denies pain at the site currently. No complaints noted.  Discharging with biliary tube surgical incsions and por tas documented on the LDA    An After Visit Summary (AVS) was printed and given to the patient. Patient escorted via wheelchair, and discharged home via private auto.  [name], RN

## 2024-12-06 NOTE — Plan of Care (Signed)
" °  Problem: Education: Goal: Knowledge of General Education information will improve Description: Including pain rating scale, medication(s)/side effects and non-pharmacologic comfort measures Outcome: Progressing   Problem: Clinical Measurements: Goal: Respiratory complications will improve Outcome: Progressing   Problem: Clinical Measurements: Goal: Ability to maintain clinical measurements within normal limits will improve Outcome: Not Progressing   Problem: Activity: Goal: Risk for activity intolerance will decrease Outcome: Not Progressing   Problem: Pain Managment: Goal: General experience of comfort will improve and/or be controlled Outcome: Not Progressing   "

## 2024-12-06 NOTE — Assessment & Plan Note (Deleted)
 Hgb 7.5 so will repeat H/H before proceeding with transfusion as his threshold is <8. No clinical signs of bleeding. Currently on heparin  gtt.  - Repeat H/H now, transfusion if < 8 - Continue Eliquis  5 mg BID - Daily CBC - Transfusion threshold < 8

## 2024-12-06 NOTE — Progress Notes (Signed)
 Patient discharged from unit  medications and property returned to designated person. Discharge instructions reviewed and patient questions answered.

## 2024-12-06 NOTE — Inpatient Diabetes Management (Addendum)
 Inpatient Diabetes Program Recommendations  AACE/ADA: New Consensus Statement on Inpatient Glycemic Control (2015)  Target Ranges:  Prepandial:   less than 140 mg/dL      Peak postprandial:   less than 180 mg/dL (1-2 hours)      Critically ill patients:  140 - 180 mg/dL   Lab Results  Component Value Date   GLUCAP 290 (H) 12/06/2024   HGBA1C 9.6 (H) 11/15/2024    Review of Glycemic Control  Latest Reference Range & Units 12/06/24 04:04 12/06/24 08:00  Glucose-Capillary 70 - 99 mg/dL 751 (H) 709 (H)   Diabetes history: DM 2 Outpatient Diabetes medications:  Basaglar  10-12 units daily Humalog  3-5 units tid with meals  Metformin  1000 mg bid Current orders for Inpatient glycemic control:  Novolog  0-9 units tid with meals  Lantus  10 units daily Inpatient Diabetes Program Recommendations:    Note low blood sugars.  Consider reducing Novolog  0-6 units tid with meals.  May also benefit from splitting Lantus  to 5 units bid.   Addendum:  Spoke to patient at bedside regarding labile CBG's.  He states that blood sugars have been difficult to control since he was diagnosed in his 30's.  He states that he feels the rapid acting insulin  often drops him too much- therefore he is sometimes afraid when he takes it.  He does wear a sensor.  He states that when he has a low blood sugar he often eats a lot to bring it up.  We discussed the 15/15 rule of 15 grams of CHO and recheck in 15 minutes followed by a snack that includes protein.  Encouraged close follow up and possibly ask for referral to endocrinology?  Also discussed importance of avoiding low blood sugars.  He does have nasal glucagon  at home that he states he's never had to use.   Thanks,  Randall Bullocks, RN, BC-ADM Inpatient Diabetes Coordinator Pager [phone]  (8a-5p)

## 2024-12-06 NOTE — TOC Transition Note (Signed)
 Transition of Care (TOC) - Discharge Note Rayfield Gobble RN, BSN Inpatient Care Management Unit 4E- RN Case Manager See Treatment Team for direct phone #   Patient Details  Name: Colin Keller MRN: [id number] Date of Birth: Apr 30, 1956  Transition of Care [facility]) CM/SW Contact:  Gobble Rayfield Hurst, RN Phone Number: 12/06/2024, 12:50 PM   Clinical Narrative:    Pt stable for transition home today, HH has been set up with [facility] per previous CM note and confirmed accepted in the Hub. [facility] liaison to follow up for start of care.   Family to transport home, note pt had been started on Eliquis  PTA.   No further IP CM needs noted.    Final next level of care: Home w Home Health Services Barriers to Discharge: No Barriers Identified   Patient Goals and CMS Choice Patient states their goals for this hospitalization and ongoing recovery are:: return home   Choice offered to / list presented to : Patient      Discharge Placement               Home w/ [facility]        Discharge Plan and Services Additional resources added to the After Visit Summary for     Discharge Planning Services: CM Consult Post Acute Care Choice: Home Health          DME Arranged: N/A DME Agency: NA       HH Arranged: RN, PT HH Agency: [facility] Date [facility] Agency Contacted: 11/28/24   Representative spoke with at [facility] Agency: Hub  Social Drivers of Health (SDOH) Interventions SDOH Screenings   Food Insecurity: No Food Insecurity (11/19/2024)  Housing: Low Risk (11/19/2024)  Transportation Needs: No Transportation Needs (11/19/2024)  Utilities: Not At Risk (11/19/2024)  Alcohol  Screen: Low Risk (08/07/2023)  Depression (PHQ2-9): Low Risk (08/22/2024)  Financial Resource Strain: Low Risk (08/07/2023)  Physical Activity: Sufficiently Active (08/07/2023)  Social Connections: Moderately Isolated (11/19/2024)  Stress: No Stress Concern Present (08/07/2023)  Tobacco Use: High Risk (11/28/2024)   Health Literacy: Adequate Health Literacy (08/07/2023)     Readmission Risk Interventions    12/06/2024   12:50 PM  Readmission Risk Prevention Plan  Transportation Screening Complete  HRI or Home Care Consult Complete  SW Recovery Care/Counseling Consult Complete  Palliative Care Screening Not Applicable  Skilled Nursing Facility Not Applicable

## 2024-12-06 NOTE — Progress Notes (Signed)
 "  Rounding Note   Patient Name: Colin Keller Date of Encounter: 12/06/2024  [facility] Cardiologist: Redell Shallow, MD   Subjective  Denies any CP or SOB.   Scheduled Meds:  acetaminophen   650 mg Oral QID   amiodarone   200 mg Oral BID   apixaban   5 mg Oral BID   carvedilol   12.5 mg Oral BID WC   Chlorhexidine  Gluconate Cloth  6 each Topical Daily   feeding supplement  237 mL Oral TID BM   hydrALAZINE   50 mg Oral Q8H   insulin  aspart  0-6 Units Subcutaneous TID WC   isosorbide  mononitrate  60 mg Oral Daily   lidocaine   3 patch Transdermal Q24H   multivitamin with minerals  1 tablet Oral Daily   pantoprazole  (PROTONIX ) IV  40 mg Intravenous Q12H   [START ON 12/07/2024] polyethylene glycol  17 g Oral Daily   [START ON 12/07/2024] senna  2 tablet Oral Daily   sodium chloride  flush  5 mL Intracatheter Q8H   sucralfate   1 g Oral BID   tamsulosin   0.4 mg Oral Daily   Continuous Infusions:  PRN Meds: alum & mag hydroxide-simeth, artificial tears, melatonin, oxyCODONE  **OR** oxyCODONE , prochlorperazine , tetracaine    Vital Signs  Vitals:   12/06/24 0300 12/06/24 0500 12/06/24 0700 12/06/24 0801  BP:    (!) 153/70  Pulse:    70  Resp: 15 20 15 18   Temp:    (!) 97.3 F (36.3 C)  TempSrc:    Axillary  SpO2:    97%  Weight:  67 kg    Height:        Intake/Output Summary (Last 24 hours) at 12/06/2024 1023 Last data filed at 12/06/2024 0542 Gross per 24 hour  Intake 52.16 ml  Output 400 ml  Net -347.84 ml      12/06/2024    5:00 AM 12/05/2024    6:30 AM 12/04/2024    4:09 AM  Last 3 Weights  Weight (lbs) 147 lb 11.3 oz 149 lb 7.6 oz 132 lb 11.5 oz  Weight (kg) 67 kg 67.8 kg 60.2 kg      Telemetry Afib converted to NSR around 2:35PM opn 12/05/2024, holding sinus rhythm with HR 70s since.  - Personally Reviewed  ECG   12/23 Atrial fibrillation with RVR - Personally Reviewed  Physical Exam  GEN: Frail  Neck: No JVD Cardiac: RRR, no murmurs,  rubs, or gallops.  Respiratory: Clear to auscultation bilaterally. GI: Soft, nontender, non-distended  MS: No edema; No deformity. Neuro:  Nonfocal  Psych: Normal affect   Labs High Sensitivity Troponin:   Recent Labs  Lab 11/15/24 1708 11/15/24 1908 11/22/24 1735  TROPONINIHS 95* 87* 25*     Chemistry Recent Labs  Lab 12/03/24 0437 12/04/24 0326 12/05/24 0358 12/06/24 0738  NA 138 137 135 131*  K 4.3 3.9 4.1 5.0  CL 100 99 98 98  CO2 25 24 20* 20*  GLUCOSE 244* 281* 266* 300*  BUN 75* 75* 74* 78*  CREATININE 3.45* 3.36* 3.40* 3.65*  CALCIUM  8.5* 8.7* 8.7* 8.8*  MG 2.6* 2.7*  --  3.0*  PROT  --  5.7* 5.5* 5.7*  ALBUMIN   --  2.7* 2.5* 2.8*  AST  --  13* 15 38  ALT  --  30 25 35  ALKPHOS  --  528* 470* 462*  BILITOT  --  0.7 0.6 0.7  GFRNONAA 18* 19* 19* 17*  ANIONGAP 13 13 16* 14  Lipids No results for input(s): CHOL, TRIG, HDL, LABVLDL, LDLCALC, CHOLHDL in the last 168 hours.  Hematology Recent Labs  Lab 12/04/24 0326 12/05/24 0358 12/06/24 0738  WBC 12.8* 10.8* 13.8*  RBC 2.82* 2.76* 2.50*  HGB 8.8* 8.5* 7.8*  HCT 26.8* 26.2* 23.4*  MCV 95.0 94.9 93.6  MCH 31.2 30.8 31.2  MCHC 32.8 32.4 33.3  RDW 15.7* 15.5 15.1  PLT 272 347 350   Thyroid No results for input(s): TSH, FREET4 in the last 168 hours.  BNPNo results for input(s): BNP, PROBNP in the last 168 hours.  DDimer No results for input(s): DDIMER in the last 168 hours.   Radiology  IR IMAGING GUIDED PORT INSERTION Result Date: 12/04/2024 INDICATION: History of pancreatic cancer. EXAM: IMPLANTED PORT A CATH PLACEMENT WITH ULTRASOUND AND FLUOROSCOPIC GUIDANCE MEDICATIONS: None ANESTHESIA/SEDATION: Moderate (conscious) sedation was employed during this procedure. A total of Versed  1.5 mg and Fentanyl  75 mcg was administered intravenously. Moderate Sedation Time: 20 minutes. The patient's level of consciousness and vital signs were monitored continuously by radiology nursing  throughout the procedure under my direct supervision. FLUOROSCOPY: Radiation Exposure Index and estimated peak skin dose (PSD); Reference air kerma (RAK), 4 mGy. COMPLICATIONS: None immediate. PROCEDURE: The procedure, risks, benefits, and alternatives were explained to the patient. Questions regarding the procedure were encouraged and answered. The patient understands and consents to the procedure. The RIGHT neck and chest were prepped with chlorhexidine  in a sterile fashion, and a sterile drape was applied covering the operative field. Maximum barrier sterile technique with sterile gowns and gloves were used for the procedure. A timeout was performed prior to the initiation of the procedure. Local anesthesia was provided with 1% lidocaine  with epinephrine . After creating a small venotomy incision, a micropuncture kit was utilized to access the internal jugular vein under direct, real-time ultrasound guidance. Ultrasound image documentation was performed. The microwire was kinked to measure appropriate catheter length. A subcutaneous port pocket was then created along the upper chest wall utilizing a combination of sharp and blunt dissection. The pocket was irrigated with sterile saline. A single lumen power injectable port was chosen for placement. The 8 Fr catheter was tunneled from the port pocket site to the venotomy incision. The port was placed in the pocket. The external catheter was trimmed to appropriate length. At the venotomy, an 8 Fr peel-away sheath was placed over a guidewire under fluoroscopic guidance. The catheter was then placed through the sheath and the sheath was removed. Final catheter positioning was confirmed and documented with a fluoroscopic spot radiograph. The port was accessed with a Huber needle, aspirated and flushed with heparinized saline. The port pocket incision was closed with interrupted 3-0 Vicryl suture then Dermabond was applied, including at the venotomy incision. Dressings  were placed. The patient tolerated the procedure well without immediate post procedural complication. IMPRESSION: Successful placement of a RIGHT internal jugular approach power injectable Port-A-Cath. The tip of the catheter is positioned within the proximal RIGHT atrium. The catheter is ready for immediate use. Thom Hall, MD [facility] Radiology Electronically Signed   By: Thom Hall M.D.   On: 12/04/2024 15:02   DG Abd 1 View Result Date: 12/04/2024 CLINICAL DATA:  Abdominal pain and distention EXAM: ABDOMEN - 1 VIEW COMPARISON:  Four days ago FINDINGS: No abnormal bowel dilatation. Stable common bile duct stent. Stable pigtail catheter seen in right upper quadrant. Mild amount of stool seen in distal sigmoid colon and rectum. IMPRESSION: Stable support  apparatus. No abnormal bowel dilatation. Electronically Signed   By: [name] Landy Raddle M.D.   On: 12/04/2024 11:08    Cardiac Studies  Limited echo 11/23/2024  1. Limited echo with limited images   2. Left ventricular ejection fraction, by estimation, is 50 to 55%. Left  ventricular ejection fraction by 2D MOD biplane is 52.0 %. The left  ventricle has low normal function. The left ventricle demonstrates global  hypokinesis. There is mild left  ventricular hypertrophy. Left ventricular diastolic parameters are  consistent with Grade III diastolic dysfunction (restrictive). Elevated  left ventricular end-diastolic pressure.   3. The mitral valve is degenerative. Trivial mitral valve regurgitation.   4. The inferior vena cava is normal in size with <50% respiratory  variability, suggesting right atrial pressure of 8 mmHg.   Comparison(s): Changes from prior study are noted. 11/16/2024: LVEF 45-50%,  grade III DD.    Limited echo 11/27/2024  1. Left ventricular ejection fraction, by estimation, is 40 to 45%. The  left ventricle has mildly decreased function. The left ventricle   demonstrates global hypokinesis. There is mild concentric left ventricular  hypertrophy. Left ventricular diastolic  function could not be evaluated.   2. Right ventricular systolic function is moderately reduced.   3. Left atrial size was moderately dilated.   4. The mitral valve is degenerative. Trivial mitral valve regurgitation.  Severe mitral annular calcification.   5. The aortic valve is tricuspid. There is mild calcification of the  aortic valve. Aortic valve sclerosis/calcification is present, without any  evidence of aortic stenosis.   Conclusion(s)/Recommendation(s): EF hard to assess in setting of rapid AF.     Patient Profile   68 y.o. male with PMH of CAD s/p CABG, PAD, HTN, HLD, DM II, tobacco abuse, recent PE 12/5, R subclavian stenosis and carotid artery disease who was admitted on 12/9 with abdominal pain. Imaging reviewed dilatation of common bile duct concerning for ascending cholangitis. Underwent ERCP complicated by symptomatic bradycardia (HR 30-40s), hypothermia and AKI requiring transfer to ICU.  Cardiology consulted, felt bradycardia related to sepsis. He went into new afib with RVR on 12/17. TSH normal. Echo on 12/17 showed EF dropped to 40-45% from previous 50-55% on 12/13. EUS showed a pancreatitis head mass with abutment of the portal vein. FNA on 12/18 showed adenocarcinoma.   Assessment & Plan   New atrial fibrillation with RVR  - In the setting of sepsis, occurred on 12/17.   - started on IV amiodarone , initially converted on IV amiodarone . Back in afib on 12/22, IV amiodarone  restarted, transitioned to oral 12/15. Converted back to sinus rhythm on 12/25 at 2:35PM, currently holding NSR.   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 Confirmed on FNA on 11/28/2024. Port-a-cath placed by IR 12/24  LV dysfunction  - Echo 12/13 showed EF 50-55%. Repeat TTE 12/17  showed EF 40-45%. Maybe related to new afib, plan to repeat limited echo if able to hold sinus rhythm  Recent PE: diagnosed recently on 11/15/2024  Acute on chronic renal insufficiency  - nephrology following, Cr up to 3.65, initially planned for renal biopsy, this has been held off as patient is overwhelmed with cancer diagnosis.   Anemia: of chronic disease. Received 1 unit of PRBC  Symptomatic bradycardia: resolved. Occurred during ERCP. Cardiology initially consulted on 12/12.   CAD s/p CABG: denies any CP   PAD: followed by Dr. Darron  as outpatient Hypertension Hyperlipidemia DM II    For questions or updates, please contact  [facility] Please consult [email or web address] for contact info under     Signed, Scot Ford, PA  12/06/2024, 10:23 AM    "

## 2024-12-06 NOTE — Assessment & Plan Note (Deleted)
 CBGs continue to be labile, 39 - 248 in last 24hrs. Transitioned SSI as below and continue with short-acting insulin  for now to determine insulin  requirements.  - Transitioned to very sensitive SSI - Discontinue Lantus  10 units daily - Hold home metformin 

## 2024-12-06 NOTE — Assessment & Plan Note (Deleted)
 Cr stable but elevated to 3.65. Adequate UOP.  After extensive discussion with family and nephrology, decision was reached not to pursue a kidney biopsy at this time due to new overwhelming cancer diagnosis.  Nephrology agrees given creatinine stability it is reasonable to defer biopsy to outpatient setting. - Nephro signed off, appreciate recommendations, outpatient follow-up after discharge - Holding hydrochlorothiazide  - AM CMP

## 2024-12-09 ENCOUNTER — Telehealth: Payer: Self-pay

## 2024-12-10 ENCOUNTER — Telehealth: Payer: Self-pay | Admitting: Oncology

## 2024-12-10 NOTE — Telephone Encounter (Signed)
 I spoke to patient's spouse on 12/10/2024 in the am to schedule lab and MD per staff message. Patient's spouse stated he passed away on 2025/01/02.

## 2024-12-12 NOTE — Telephone Encounter (Signed)
 Returned call to number provided below, answered by daughter's mother-in-law.  Patient was transported to [facility] today where he subsequently passed away. Offered condolences and advised to let us  know if there is anything else we can help with to reach out.  Will forward to PCP as FYI.

## 2024-12-12 NOTE — Telephone Encounter (Signed)
 Received VM from patient's wife regarding concern with patient. She is asking to speak with Dr. McDiarmid regarding patient having an unresponsive episode and being taken to [facility]. She reports that EMS came to the home and patient coded and was then taken to [facility].   As PCP is out of the office this week, consulted with Dr. Rumball who advised that she would contact patient's wife.   CB number for wife is [phone].  Chiquita JAYSON English, RN

## 2024-12-12 DEATH — deceased

## 2024-12-16 ENCOUNTER — Encounter: Payer: Self-pay | Admitting: Family Medicine

## 2024-12-16 NOTE — Progress Notes (Signed)
 Completion of Death Certificate for Mr Colin Keller Immediate cause of natural death Pancreatic Cancer, malignant, adenocarcinome

## 2024-12-17 ENCOUNTER — Ambulatory Visit: Admitting: Pharmacist

## 2024-12-26 ENCOUNTER — Ambulatory Visit (HOSPITAL_COMMUNITY)

## 2025-01-09 ENCOUNTER — Ambulatory Visit: Admitting: Family Medicine

## 2025-01-10 ENCOUNTER — Telehealth: Payer: Self-pay | Admitting: Pharmacist

## 2025-01-10 NOTE — Telephone Encounter (Signed)
 Spouse, Katheryn Gaskins   Called office with multiple questions related to passing of husband of husband 12/24/2024 She expressed concern related to bloating prior to event.  She was curious on many reasons for many events that transpired during the event.   I agreed to take sample Libre 3+ sensors back for potential future use.   I also shared that any medications CANNOT be reused for any other patient.  She understood that she would need to send all medications for destruction.

## 2025-01-21 ENCOUNTER — Ambulatory Visit: Admitting: Cardiovascular Disease
